# Patient Record
Sex: Male | Born: 1975 | Race: White | Hispanic: No | Smoking: Current every day smoker
Health system: Southern US, Community
[De-identification: ages and names within clinical notes are randomized; demographics above are authoritative.]

## PROBLEM LIST (undated history)

## (undated) DIAGNOSIS — F1120 Opioid dependence, uncomplicated: Secondary | ICD-10-CM

## (undated) DIAGNOSIS — J939 Pneumothorax, unspecified: Secondary | ICD-10-CM

## (undated) DIAGNOSIS — C328 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: Secondary | ICD-10-CM

## (undated) DIAGNOSIS — J9601 Acute respiratory failure with hypoxia: Secondary | ICD-10-CM

## (undated) DIAGNOSIS — I330 Acute and subacute infective endocarditis: Secondary | ICD-10-CM

## (undated) HISTORY — PX: OTHER SURGICAL HISTORY: SHX169

---

## 2002-11-03 ENCOUNTER — Emergency Department (HOSPITAL_COMMUNITY): Admission: EM | Admit: 2002-11-03 | Discharge: 2002-11-04 | Payer: Self-pay | Admitting: Emergency Medicine

## 2003-02-16 ENCOUNTER — Emergency Department (HOSPITAL_COMMUNITY): Admission: AD | Admit: 2003-02-16 | Discharge: 2003-02-16 | Payer: Self-pay | Admitting: Family Medicine

## 2003-06-06 ENCOUNTER — Emergency Department (HOSPITAL_COMMUNITY): Admission: EM | Admit: 2003-06-06 | Discharge: 2003-06-06 | Payer: Self-pay | Admitting: Emergency Medicine

## 2007-02-17 ENCOUNTER — Emergency Department (HOSPITAL_COMMUNITY): Admission: EM | Admit: 2007-02-17 | Discharge: 2007-02-17 | Payer: Self-pay | Admitting: Emergency Medicine

## 2007-11-22 ENCOUNTER — Emergency Department (HOSPITAL_BASED_OUTPATIENT_CLINIC_OR_DEPARTMENT_OTHER): Admission: EM | Admit: 2007-11-22 | Discharge: 2007-11-22 | Payer: Self-pay | Admitting: Emergency Medicine

## 2008-01-25 ENCOUNTER — Emergency Department (HOSPITAL_BASED_OUTPATIENT_CLINIC_OR_DEPARTMENT_OTHER): Admission: EM | Admit: 2008-01-25 | Discharge: 2008-01-25 | Payer: Self-pay | Admitting: Emergency Medicine

## 2009-02-13 ENCOUNTER — Emergency Department (HOSPITAL_BASED_OUTPATIENT_CLINIC_OR_DEPARTMENT_OTHER): Admission: EM | Admit: 2009-02-13 | Discharge: 2009-02-13 | Payer: Self-pay | Admitting: Emergency Medicine

## 2010-05-04 ENCOUNTER — Emergency Department (HOSPITAL_BASED_OUTPATIENT_CLINIC_OR_DEPARTMENT_OTHER)
Admission: EM | Admit: 2010-05-04 | Discharge: 2010-05-04 | Disposition: A | Discharge status: Home / Self Care | Payer: BLUE CROSS/BLUE SHIELD | Attending: Emergency Medicine | Admitting: Emergency Medicine

## 2010-05-04 ENCOUNTER — Emergency Department (INDEPENDENT_AMBULATORY_CARE_PROVIDER_SITE_OTHER): Payer: BLUE CROSS/BLUE SHIELD

## 2010-05-04 DIAGNOSIS — S022XXA Fracture of nasal bones, initial encounter for closed fracture: Secondary | ICD-10-CM | POA: Insufficient documentation

## 2010-05-04 DIAGNOSIS — R51 Headache: Secondary | ICD-10-CM

## 2010-05-04 DIAGNOSIS — F1010 Alcohol abuse, uncomplicated: Secondary | ICD-10-CM | POA: Insufficient documentation

## 2010-05-04 DIAGNOSIS — F0781 Postconcussional syndrome: Secondary | ICD-10-CM | POA: Insufficient documentation

## 2010-05-04 DIAGNOSIS — F1910 Other psychoactive substance abuse, uncomplicated: Secondary | ICD-10-CM | POA: Insufficient documentation

## 2010-05-04 DIAGNOSIS — X58XXXA Exposure to other specified factors, initial encounter: Secondary | ICD-10-CM

## 2010-05-04 DIAGNOSIS — S0990XA Unspecified injury of head, initial encounter: Secondary | ICD-10-CM | POA: Insufficient documentation

## 2010-05-04 DIAGNOSIS — W19XXXA Unspecified fall, initial encounter: Secondary | ICD-10-CM | POA: Insufficient documentation

## 2010-05-04 DIAGNOSIS — F17200 Nicotine dependence, unspecified, uncomplicated: Secondary | ICD-10-CM | POA: Insufficient documentation

## 2010-11-02 LAB — COMPREHENSIVE METABOLIC PANEL
ALT: 134 U/L — ABNORMAL HIGH (ref 0–53)
AST: 108 U/L — ABNORMAL HIGH (ref 0–37)
Albumin: 5.2 g/dL (ref 3.5–5.2)
Alkaline Phosphatase: 89 U/L (ref 39–117)
Chloride: 91 mEq/L — ABNORMAL LOW (ref 96–112)
Potassium: 3.6 mEq/L (ref 3.5–5.1)
Sodium: 136 mEq/L (ref 135–145)
Total Bilirubin: 1.1 mg/dL (ref 0.3–1.2)
Total Protein: 8.5 g/dL — ABNORMAL HIGH (ref 6.0–8.3)

## 2011-07-27 ENCOUNTER — Ambulatory Visit (INDEPENDENT_AMBULATORY_CARE_PROVIDER_SITE_OTHER): Payer: BC Managed Care – PPO | Admitting: Emergency Medicine

## 2011-07-27 ENCOUNTER — Ambulatory Visit: Payer: BC Managed Care – PPO

## 2011-07-27 VITALS — BP 136/101 | HR 91 | Temp 98.0°F | Resp 18 | Ht 69.5 in | Wt 130.0 lb

## 2011-07-27 DIAGNOSIS — Z5181 Encounter for therapeutic drug level monitoring: Secondary | ICD-10-CM

## 2011-07-27 DIAGNOSIS — R079 Chest pain, unspecified: Secondary | ICD-10-CM

## 2011-07-27 DIAGNOSIS — F411 Generalized anxiety disorder: Secondary | ICD-10-CM

## 2011-07-27 DIAGNOSIS — Z202 Contact with and (suspected) exposure to infections with a predominantly sexual mode of transmission: Secondary | ICD-10-CM

## 2011-07-27 DIAGNOSIS — R21 Rash and other nonspecific skin eruption: Secondary | ICD-10-CM

## 2011-07-27 DIAGNOSIS — Z79891 Long term (current) use of opiate analgesic: Secondary | ICD-10-CM

## 2011-07-27 DIAGNOSIS — Z2089 Contact with and (suspected) exposure to other communicable diseases: Secondary | ICD-10-CM

## 2011-07-27 DIAGNOSIS — F419 Anxiety disorder, unspecified: Secondary | ICD-10-CM

## 2011-07-27 LAB — POCT CBC
Granulocyte percent: 64.1 %G (ref 37–80)
MCH, POC: 32.6 pg — AB (ref 27–31.2)
MID (cbc): 0.8 (ref 0–0.9)
MPV: 9.3 fL (ref 0–99.8)
POC Granulocyte: 5.1 (ref 2–6.9)
POC MID %: 10.2 %M (ref 0–12)
Platelet Count, POC: 259 10*3/uL (ref 142–424)
RBC: 5.15 M/uL (ref 4.69–6.13)
WBC: 7.9 10*3/uL (ref 4.6–10.2)

## 2011-07-27 LAB — HEPATITIS C ANTIBODY: HCV Ab: NEGATIVE

## 2011-07-27 LAB — HIV ANTIBODY (ROUTINE TESTING W REFLEX): HIV: NONREACTIVE

## 2011-07-27 NOTE — Progress Notes (Signed)
  Subjective:    Patient ID: David [name], male    DOB: 1975-09-18, 36 y.o.   MRN: [id number]  HPI patient enters pain in the left side of his chest. He initially had some pain on the right side of his chest 6 weeks ago from an injury in then developed some pain on the left side. This became worse with a popping sensation and he is in today to have this checked. Patient also had STD exposure 6 weeks ago. His partner told him she had hepatitis C and HSV however he is not sure whether this is true. He has had no symptoms. 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 His neck supple. Chest is clear there is tenderness left lateral chest wall. The abdomen is soft liver and spleen are not enlarged there is no tenderness. Results for orders placed in visit on 07/27/11  POCT CBC      Component Value Range   WBC 7.9  4.6 - 10.2 K/uL   Lymph, poc 2.0  0.6 - 3.4   POC LYMPH PERCENT 25.7  10 - 50 %L   MID (cbc) 0.8  0 - 0.9   POC MID % 10.2  0 - 12 %M   POC Granulocyte 5.1  2 - 6.9   Granulocyte percent 64.1  37 - 80 %G   RBC 5.15  4.69 - 6.13 M/uL   Hemoglobin 16.8  14.1 - 18.1 g/dL   HCT, POC 40.9  81.1 - 53.7 %   MCV 100.9 (*) 80 - 97 fL   MCH, POC 32.6 (*) 27 - 31.2 pg   MCHC 32.3  31.8 - 35.4 g/dL   RDW, POC 91.4     Platelet Count, POC 259  142 - 424 K/uL   MPV 9.3  0 - 99.8 fL   [facility] reading (PRIMARY) by  Dr. Cleta Alberts  There is no evidence of a pneumothorax . There may be slight buckling of the left ninth rib       Assessment & Plan:  Patient to have a full STD screen. Also check films of the chest. No prescriptions were written today. He is to continued with [facility] regarding his anxiety disorder and Suboxone therapy.

## 2011-07-29 LAB — GC/CHLAMYDIA PROBE AMP, GENITAL: GC Probe Amp, Genital: NEGATIVE

## 2013-09-23 ENCOUNTER — Emergency Department (HOSPITAL_BASED_OUTPATIENT_CLINIC_OR_DEPARTMENT_OTHER): Payer: BC Managed Care – PPO

## 2013-09-23 ENCOUNTER — Emergency Department (HOSPITAL_BASED_OUTPATIENT_CLINIC_OR_DEPARTMENT_OTHER)
Admission: EM | Admit: 2013-09-23 | Discharge: 2013-09-23 | Disposition: A | Discharge status: Home / Self Care | Payer: BC Managed Care – PPO | Attending: Emergency Medicine | Admitting: Emergency Medicine

## 2013-09-23 ENCOUNTER — Encounter (HOSPITAL_BASED_OUTPATIENT_CLINIC_OR_DEPARTMENT_OTHER): Payer: Self-pay | Admitting: Emergency Medicine

## 2013-09-23 DIAGNOSIS — Z79899 Other long term (current) drug therapy: Secondary | ICD-10-CM | POA: Insufficient documentation

## 2013-09-23 DIAGNOSIS — R05 Cough: Secondary | ICD-10-CM | POA: Insufficient documentation

## 2013-09-23 DIAGNOSIS — M79609 Pain in unspecified limb: Secondary | ICD-10-CM | POA: Insufficient documentation

## 2013-09-23 DIAGNOSIS — R059 Cough, unspecified: Secondary | ICD-10-CM | POA: Insufficient documentation

## 2013-09-23 DIAGNOSIS — Z8709 Personal history of other diseases of the respiratory system: Secondary | ICD-10-CM | POA: Diagnosis not present

## 2013-09-23 DIAGNOSIS — I82811 Embolism and thrombosis of superficial veins of right lower extremities: Secondary | ICD-10-CM

## 2013-09-23 DIAGNOSIS — F17200 Nicotine dependence, unspecified, uncomplicated: Secondary | ICD-10-CM | POA: Diagnosis not present

## 2013-09-23 DIAGNOSIS — I82819 Embolism and thrombosis of superficial veins of unspecified lower extremities: Secondary | ICD-10-CM | POA: Diagnosis not present

## 2013-09-23 HISTORY — DX: Opioid dependence, uncomplicated: F11.20

## 2013-09-23 HISTORY — DX: Pneumothorax, unspecified: J93.9

## 2013-09-23 MED ORDER — BUPRENORPHINE HCL-NALOXONE HCL 4-1 MG SL FILM: 1.0000 | ORAL_FILM | Freq: Every day | SUBLINGUAL | Status: DC

## 2013-09-23 NOTE — ED Provider Notes (Signed)
CSN: [id number]     Arrival date & time 09/23/13  1359 History   First MD Initiated Contact with Patient 09/23/13 1451     Chief Complaint  Patient presents with  . Leg Pain     (Consider location/radiation/quality/duration/timing/severity/associated sxs/prior Treatment) Patient is a 38 y.o. male presenting with leg pain. The history is provided by the patient.  Leg Pain Location:  Leg Time since incident:  1 day Injury: no   Leg location:  R leg Pain details:    Quality:  Aching   Radiates to:  Does not radiate   Severity:  Mild   Onset quality:  Gradual   Duration:  1 day   Timing:  Constant   Progression:  Unchanged Chronicity:  New Dislocation: no   Foreign body present:  No foreign bodies Prior injury to area:  No Relieved by:  Nothing Worsened by:  Nothing tried Ineffective treatments:  None tried Associated symptoms: no fever and no neck pain     Past Medical History  Diagnosis Date  . Heroin addiction   . Pneumothorax     Left lung spontaneous pneumothorax at age 69 yr    Past Surgical History  Procedure Laterality Date  . Chest tubes Left    No family history on file. History  Substance Use Topics  . Smoking status: Current Every Day Smoker -- 3.00 packs/day    Types: Cigarettes  . Smokeless tobacco: Never Used  . Alcohol Use: 7.2 oz/week    12 Cans of beer per week     Comment: daily    Review of Systems  Constitutional: Negative for fever.  HENT: Negative for drooling and rhinorrhea.   Eyes: Negative for pain.  Respiratory: Positive for cough. Negative for shortness of breath.   Cardiovascular: Negative for chest pain and leg swelling.  Gastrointestinal: Negative for nausea, vomiting, abdominal pain and diarrhea.  Genitourinary: Negative for dysuria and hematuria.  Musculoskeletal: Negative for gait problem and neck pain.       Leg pain  Skin: Negative for color change.  Neurological: Negative for numbness and headaches.  Hematological:  Negative for adenopathy.  Psychiatric/Behavioral: Negative for behavioral problems.  All other systems reviewed and are negative.     Allergies  Review of patient's allergies indicates no known allergies.  Home Medications   Prior to Admission medications   Medication Sig Start Date End Date Taking? Authorizing Provider  buprenorphine-naloxone (SUBOXONE) 8-2 MG SUBL Place under the tongue daily.    Historical Provider, MD   BP 119/89  Pulse 104  Temp(Src) 98.1 F (36.7 C) (Oral)  Resp 22  SpO2 100% Physical Exam  Nursing note and vitals reviewed. Constitutional: He is oriented to person, place, and time. He appears well-developed and well-nourished.  HENT:  Head: Normocephalic and atraumatic.  Right Ear: External ear normal.  Left Ear: External ear normal.  Nose: Nose normal.  Mouth/Throat: Oropharynx is clear and moist. No oropharyngeal exudate.  Eyes: Conjunctivae and EOM are normal. Pupils are equal, round, and reactive to light.  Neck: Normal range of motion. Neck supple.  Cardiovascular: Normal rate, regular rhythm, normal heart sounds and intact distal pulses.  Exam reveals no gallop and no friction rub.   No murmur heard. Pulmonary/Chest: Effort normal and breath sounds normal. No respiratory distress. He has no wheezes.  Abdominal: Soft. Bowel sounds are normal. He exhibits no distension. There is no tenderness. There is no rebound and no guarding.  Musculoskeletal: Normal range of motion. He  exhibits tenderness. He exhibits no edema.  2+ distal pulses and lower extremities.  Small area of erythema and mild tenderness to palpation in the medial popliteal fossa and distal medial thigh of the right lower extremity.  Neurological: He is alert and oriented to person, place, and time.  Skin: Skin is warm and dry.  Psychiatric: He has a normal mood and affect. His behavior is normal.    ED Course  Procedures (including critical care time) Labs Review Labs Reviewed -  No data to display  Imaging Review Dg Chest 2 View  09/23/2013   CLINICAL DATA:  Cough and chest congestion for 3 weeks; history of left-sided pneumothorax  EXAM: CHEST  2 VIEW  COMPARISON:  Rib series of July 27, 2011  FINDINGS: The lungs are well-expanded and clear. The heart and pulmonary vascularity are normal. There is no pleural effusion. There is no pneumothorax or pneumomediastinum. The bony thorax is unremarkable.  IMPRESSION: There is no acute cardiopulmonary abnormality.   Electronically Signed   By: David  Martinique   On: 09/23/2013 15:49   US Venous Img Lower Unilateral Right  09/23/2013   CLINICAL DATA:  Redness. Intravenous drug abuse. Deep venous thrombosis. Cellulitis. Redness in the medial knee at heroin injection site.  EXAM: RIGHT LOWER EXTREMITY VENOUS DOPPLER ULTRASOUND  TECHNIQUE: Gray-scale sonography with graded compression, as well as color Doppler and duplex ultrasound were performed to evaluate the lower extremity deep venous systems from the level of the common femoral vein and including the common femoral, femoral, profunda femoral, popliteal and calf veins including the posterior tibial, peroneal and gastrocnemius veins when visible. The superficial great saphenous vein was also interrogated. Spectral Doppler was utilized to evaluate flow at rest and with distal augmentation maneuvers in the common femoral, femoral and popliteal veins.  COMPARISON:  None.  FINDINGS: Common Femoral Vein: No evidence of thrombus. Normal compressibility, respiratory phasicity and response to augmentation.  Saphenofemoral Junction: No evidence of thrombus. Normal compressibility and flow on color Doppler imaging.  Profunda Femoral Vein: No evidence of thrombus. Normal compressibility and flow on color Doppler imaging.  Femoral Vein: No evidence of thrombus. Normal compressibility, respiratory phasicity and response to augmentation.  Popliteal Vein: No evidence of thrombus. Normal compressibility,  respiratory phasicity and response to augmentation.  Calf Veins: No evidence of thrombus. Normal compressibility and flow on color Doppler imaging.  Superficial Great Saphenous Vein: Proximal saphenous vein is patent. Occlusive thrombus is present in the greater saphenous vein in the midthigh and at the level of the knee. This is noncompressible. Below-the-knee, the greater saphenous vein is patent.  Venous Reflux:  None.  Other Findings:  None.  IMPRESSION: 1. No deep venous thrombosis. 2. Occlusive thrombus in the mid to distal greater saphenous vein of the thigh. This extends from mid thigh to the knee.   Electronically Signed   By: Dereck Ligas M.D.   On: 09/23/2013 17:49     EKG Interpretation None      MDM   Final diagnoses:  Thrombosis of right saphenous vein    3:21 PM 38 y.o. male w hx of drug abuse, previously on suboxone and methadone who states he has been injecting suboxone (off the street) into his legs for about 2 mos now. He pw mild tenderness to palpation and redness of the medial popliteal fossa and medial thigh on the right lower extremity. He denies any chest pain or shortness of breath. He has had a chronic cough. Will get chest x-ray  d/t cough and ultrasound to rule out DVT.  6:36 PM: Found to have superficial thrombosis, likely related to suboxone injections. Will rec warm compress/nsaids. Will rec he refrain from injecting the suboxone. Will provide Rx for small amount of suboxone until he can f/u w/ his pcp later this month. His father is here and plans to disperse the suboxone films to him to prevent him from injecting them.  I have discussed the diagnosis/risks/treatment options with the patient and believe the pt to be eligible for discharge home to follow-up with his pcp. We also discussed returning to the ED immediately if new or worsening sx occur. We discussed the sx which are most concerning (e.g., sob, cp, inc redness, swelling, pain) that necessitate immediate  return. Medications administered to the patient during their visit and any new prescriptions provided to the patient are listed below.  Medications given during this visit Medications - No data to display  New Prescriptions   BUPRENORPHINE HCL-NALOXONE HCL (SUBOXONE) 4-1 MG FILM    Place 1 Film under the tongue daily.     Pamella Pert, MD 09/23/13 [phone]

## 2013-09-23 NOTE — ED Notes (Signed)
States he was taken off Suboxon and started injecting drugs in his legs. Track marks noted. Here with pain and a knot in his right lower leg.

## 2014-08-28 ENCOUNTER — Emergency Department (HOSPITAL_COMMUNITY)
Admission: EM | Admit: 2014-08-28 | Discharge: 2014-08-28 | Disposition: A | Discharge status: Home / Self Care | Payer: BLUE CROSS/BLUE SHIELD | Attending: Emergency Medicine | Admitting: Emergency Medicine

## 2014-08-28 ENCOUNTER — Emergency Department (HOSPITAL_COMMUNITY): Payer: BLUE CROSS/BLUE SHIELD

## 2014-08-28 ENCOUNTER — Encounter (HOSPITAL_COMMUNITY): Payer: Self-pay | Admitting: Emergency Medicine

## 2014-08-28 DIAGNOSIS — Y999 Unspecified external cause status: Secondary | ICD-10-CM | POA: Diagnosis not present

## 2014-08-28 DIAGNOSIS — Y929 Unspecified place or not applicable: Secondary | ICD-10-CM | POA: Diagnosis not present

## 2014-08-28 DIAGNOSIS — Z720 Tobacco use: Secondary | ICD-10-CM | POA: Diagnosis not present

## 2014-08-28 DIAGNOSIS — S01419A Laceration without foreign body of unspecified cheek and temporomandibular area, initial encounter: Secondary | ICD-10-CM | POA: Diagnosis not present

## 2014-08-28 DIAGNOSIS — Y939 Activity, unspecified: Secondary | ICD-10-CM | POA: Diagnosis not present

## 2014-08-28 DIAGNOSIS — S0990XA Unspecified injury of head, initial encounter: Secondary | ICD-10-CM | POA: Diagnosis not present

## 2014-08-28 DIAGNOSIS — S0181XA Laceration without foreign body of other part of head, initial encounter: Secondary | ICD-10-CM | POA: Diagnosis not present

## 2014-08-28 DIAGNOSIS — S0993XA Unspecified injury of face, initial encounter: Secondary | ICD-10-CM | POA: Diagnosis present

## 2014-08-28 DIAGNOSIS — S0083XA Contusion of other part of head, initial encounter: Secondary | ICD-10-CM

## 2014-08-28 DIAGNOSIS — Z79899 Other long term (current) drug therapy: Secondary | ICD-10-CM | POA: Insufficient documentation

## 2014-08-28 MED ORDER — LIDOCAINE-EPINEPHRINE 2 %-1:100000 IJ SOLN
20.0000 mL | Freq: Once | INTRAMUSCULAR | Status: DC
  Filled 2014-08-28: qty 1

## 2014-08-28 MED ORDER — TETANUS-DIPHTH-ACELL PERTUSSIS 5-2.5-18.5 LF-MCG/0.5 IM SUSP
0.5000 mL | Freq: Once | INTRAMUSCULAR | Status: AC
  Administered 2014-08-28: 0.5 mL via INTRAMUSCULAR
  Filled 2014-08-28: qty 0.5
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
## 2014-08-28 NOTE — Discharge Instructions (Signed)
Keep the lacerations clean. Bacitracin to the facial laceration twice a day. Ibuprofen or Tylenol for pain. Ice pack to the face. Follow-up in 5 days for suture removal.  Facial Laceration  A facial laceration is a cut on the face. These injuries can be painful and cause bleeding. Lacerations usually heal quickly, but they need special care to reduce scarring. DIAGNOSIS  Your health care provider will take a medical history, ask for details about how the injury occurred, and examine the wound to determine how deep the cut is. TREATMENT  Some facial lacerations may not require closure. Others may not be able to be closed because of an increased risk of infection. The risk of infection and the chance for successful closure will depend on various factors, including the amount of time since the injury occurred. The wound may be cleaned to help prevent infection. If closure is appropriate, pain medicines may be given if needed. Your health care provider will use stitches (sutures), wound glue (adhesive), or skin adhesive strips to repair the laceration. These tools bring the skin edges together to allow for faster healing and a better cosmetic outcome. If needed, you may also be given a tetanus shot. HOME CARE INSTRUCTIONS  Only take over-the-counter or prescription medicines as directed by your health care provider.  Follow your health care provider's instructions for wound care. These instructions will vary depending on the technique used for closing the wound. For Sutures:  Keep the wound clean and dry.   If you were given a bandage (dressing), you should change it at least once a day. Also change the dressing if it becomes wet or dirty, or as directed by your health care provider.   Wash the wound with soap and water 2 times a day. Rinse the wound off with water to remove all soap. Pat the wound dry with a clean towel.   After cleaning, apply a thin layer of the antibiotic ointment  recommended by your health care provider. This will help prevent infection and keep the dressing from sticking.   You may shower as usual after the first 24 hours. Do not soak the wound in water until the sutures are removed.   Get your sutures removed as directed by your health care provider. With facial lacerations, sutures should usually be taken out after 4-5 days to avoid stitch marks.   Wait a few days after your sutures are removed before applying any makeup. For Skin Adhesive Strips:  Keep the wound clean and dry.   Do not get the skin adhesive strips wet. You may bathe carefully, using caution to keep the wound dry.   If the wound gets wet, pat it dry with a clean towel.   Skin adhesive strips will fall off on their own. You may trim the strips as the wound heals. Do not remove skin adhesive strips that are still stuck to the wound. They will fall off in time.  For Wound Adhesive:  You may briefly wet your wound in the shower or bath. Do not soak or scrub the wound. Do not swim. Avoid periods of heavy sweating until the skin adhesive has fallen off on its own. After showering or bathing, gently pat the wound dry with a clean towel.   Do not apply liquid medicine, cream medicine, ointment medicine, or makeup to your wound while the skin adhesive is in place. This may loosen the film before your wound is healed.   If a dressing is placed over  the wound, be careful not to apply tape directly over the skin adhesive. This may cause the adhesive to be pulled off before the wound is healed.   Avoid prolonged exposure to sunlight or tanning lamps while the skin adhesive is in place.  The skin adhesive will usually remain in place for 5-10 days, then naturally fall off the skin. Do not pick at the adhesive film.  After Healing: Once the wound has healed, cover the wound with sunscreen during the day for 1 full year. This can help minimize scarring. Exposure to ultraviolet light  in the first year will darken the scar. It can take 1-2 years for the scar to lose its redness and to heal completely.  SEEK IMMEDIATE MEDICAL CARE IF:  You have redness, pain, or swelling around the wound.   You see ayellowish-white fluid (pus) coming from the wound.   You have chills or a fever.  MAKE SURE YOU:  Understand these instructions.  Will watch your condition.  Will get help right away if you are not doing well or get worse. Document Released: 02/22/2004 Document Revised: 11/04/2012 Document Reviewed: 08/27/2012 [facility] Patient Information 2015 [place], [place]. This information is not intended to replace advice given to you by your health care provider. Make sure you discuss any questions you have with your health care provider.   Head Injury You have received a head injury. It does not appear serious at this time. Headaches and vomiting are common following head injury. It should be easy to awaken from sleeping. Sometimes it is necessary for you to stay in the emergency department for a while for observation. Sometimes admission to the hospital may be needed. After injuries such as yours, most problems occur within the first 24 hours, but side effects may occur up to 7-10 days after the injury. It is important for you to carefully monitor your condition and contact your health care provider or seek immediate medical care if there is a change in your condition. WHAT ARE THE TYPES OF HEAD INJURIES? Head injuries can be as minor as a bump. Some head injuries can be more severe. More severe head injuries include:  A jarring injury to the brain (concussion).  A bruise of the brain (contusion). This mean there is bleeding in the brain that can cause swelling.  A cracked skull (skull fracture).  Bleeding in the brain that collects, clots, and forms a bump (hematoma). WHAT CAUSES A HEAD INJURY? A serious head injury is most likely to happen to someone who is in a car wreck  and is not wearing a seat belt. Other causes of major head injuries include bicycle or motorcycle accidents, sports injuries, and falls. HOW ARE HEAD INJURIES DIAGNOSED? A complete history of the event leading to the injury and your current symptoms will be helpful in diagnosing head injuries. Many times, pictures of the brain, such as CT or MRI are needed to see the extent of the injury. Often, an overnight hospital stay is necessary for observation.  WHEN SHOULD I SEEK IMMEDIATE MEDICAL CARE?  You should get help right away if:  You have confusion or drowsiness.  You feel sick to your stomach (nauseous) or have continued, forceful vomiting.  You have dizziness or unsteadiness that is getting worse.  You have severe, continued headaches not relieved by medicine. Only take over-the-counter or prescription medicines for pain, fever, or discomfort as directed by your health care provider.  You do not have normal function of the arms  or legs or are unable to walk.  You notice changes in the black spots in the center of the colored part of your eye (pupil).  You have a clear or bloody fluid coming from your nose or ears.  You have a loss of vision. During the next 24 hours after the injury, you must stay with someone who can watch you for the warning signs. This person should contact local emergency services (911 in the [place] if you have seizures, you become unconscious, or you are unable to wake up. HOW CAN I PREVENT A HEAD INJURY IN THE FUTURE? The most important factor for preventing major head injuries is avoiding motor vehicle accidents. To minimize the potential for damage to your head, it is crucial to wear seat belts while riding in motor vehicles. Wearing helmets while bike riding and playing collision sports (like football) is also helpful. Also, avoiding dangerous activities around the house will further help reduce your risk of head injury.  WHEN CAN I RETURN TO NORMAL ACTIVITIES AND  ATHLETICS? You should be reevaluated by your health care provider before returning to these activities. If you have any of the following symptoms, you should not return to activities or contact sports until 1 week after the symptoms have stopped:  Persistent headache.  Dizziness or vertigo.  Poor attention and concentration.  Confusion.  Memory problems.  Nausea or vomiting.  Fatigue or tire easily.  Irritability.  Intolerant of bright lights or loud noises.  Anxiety or depression.  Disturbed sleep. MAKE SURE YOU:   Understand these instructions.  Will watch your condition.  Will get help right away if you are not doing well or get worse. Document Released: 01/14/2005 Document Revised: 01/19/2013 Document Reviewed: 09/21/2012 [facility] Patient Information 2015 [place], [place]. This information is not intended to replace advice given to you by your health care provider. Make sure you discuss any questions you have with your health care provider.

## 2014-08-28 NOTE — ED Notes (Signed)
Bed: [facility]3 Expected date:  Expected time:  Means of arrival:  Comments: Pt
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
## 2014-09-17 ENCOUNTER — Telehealth (HOSPITAL_BASED_OUTPATIENT_CLINIC_OR_DEPARTMENT_OTHER): Payer: Self-pay | Admitting: Emergency Medicine

## 2019-02-18 ENCOUNTER — Other Ambulatory Visit: Payer: Self-pay

## 2019-02-18 ENCOUNTER — Emergency Department (HOSPITAL_BASED_OUTPATIENT_CLINIC_OR_DEPARTMENT_OTHER): Payer: Self-pay

## 2019-02-18 ENCOUNTER — Encounter (HOSPITAL_BASED_OUTPATIENT_CLINIC_OR_DEPARTMENT_OTHER): Payer: Self-pay

## 2019-02-18 ENCOUNTER — Emergency Department (HOSPITAL_BASED_OUTPATIENT_CLINIC_OR_DEPARTMENT_OTHER)
Admission: EM | Admit: 2019-02-18 | Discharge: 2019-02-18 | Disposition: A | Discharge status: Home / Self Care | Payer: Self-pay | Attending: Emergency Medicine | Admitting: Emergency Medicine

## 2019-02-18 DIAGNOSIS — S2242XA Multiple fractures of ribs, left side, initial encounter for closed fracture: Secondary | ICD-10-CM

## 2019-02-18 DIAGNOSIS — Y929 Unspecified place or not applicable: Secondary | ICD-10-CM | POA: Insufficient documentation

## 2019-02-18 DIAGNOSIS — Y939 Activity, unspecified: Secondary | ICD-10-CM | POA: Insufficient documentation

## 2019-02-18 DIAGNOSIS — Y999 Unspecified external cause status: Secondary | ICD-10-CM | POA: Insufficient documentation

## 2019-02-18 DIAGNOSIS — J90 Pleural effusion, not elsewhere classified: Secondary | ICD-10-CM

## 2019-02-18 DIAGNOSIS — F17210 Nicotine dependence, cigarettes, uncomplicated: Secondary | ICD-10-CM | POA: Insufficient documentation

## 2019-02-18 DIAGNOSIS — S270XXA Traumatic pneumothorax, initial encounter: Secondary | ICD-10-CM

## 2019-02-18 DIAGNOSIS — Z79899 Other long term (current) drug therapy: Secondary | ICD-10-CM | POA: Insufficient documentation

## 2019-02-18 MED ORDER — CELECOXIB 200 MG PO CAPS: 200.0000 mg | ORAL_CAPSULE | Freq: Two times a day (BID) | ORAL | 0 refills | Status: DC

## 2019-02-18 MED ORDER — OXYCODONE HCL 5 MG PO TABS: 2.5000 mg | ORAL_TABLET | Freq: Four times a day (QID) | ORAL | 0 refills | Status: DC | PRN
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
## 2019-02-18 NOTE — Discharge Instructions (Signed)
Contact a health care provider if: You have a fever. Get help right away if: You have difficulty breathing or you are short of breath. You develop a cough that does not stop, or you cough up thick or bloody sputum. You have nausea, vomiting, or pain in your abdomen. Your pain gets worse and medicine does not help. 
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
## 2019-08-22 ENCOUNTER — Other Ambulatory Visit: Payer: Self-pay

## 2019-08-22 ENCOUNTER — Emergency Department (HOSPITAL_BASED_OUTPATIENT_CLINIC_OR_DEPARTMENT_OTHER)
Admission: EM | Admit: 2019-08-22 | Discharge: 2019-08-22 | Disposition: A | Discharge status: Home / Self Care | Payer: BLUE CROSS/BLUE SHIELD | Attending: Emergency Medicine | Admitting: Emergency Medicine

## 2019-08-22 ENCOUNTER — Encounter (HOSPITAL_BASED_OUTPATIENT_CLINIC_OR_DEPARTMENT_OTHER): Payer: Self-pay | Admitting: Emergency Medicine

## 2019-08-22 DIAGNOSIS — R309 Painful micturition, unspecified: Secondary | ICD-10-CM | POA: Insufficient documentation

## 2019-08-22 DIAGNOSIS — Z5321 Procedure and treatment not carried out due to patient leaving prior to being seen by health care provider: Secondary | ICD-10-CM | POA: Insufficient documentation

## 2019-08-22 NOTE — ED Triage Notes (Signed)
Pt here with painful urination x 1 week.

## 2020-10-20 ENCOUNTER — Ambulatory Visit
Admission: EM | Admit: 2020-10-20 | Discharge: 2020-10-20 | Disposition: A | Discharge status: Home / Self Care | Payer: Medicaid Other | Attending: Emergency Medicine | Admitting: Emergency Medicine

## 2020-10-20 ENCOUNTER — Other Ambulatory Visit: Payer: Self-pay

## 2020-10-20 DIAGNOSIS — J302 Other seasonal allergic rhinitis: Secondary | ICD-10-CM

## 2020-10-20 MED ORDER — FLUTICASONE PROPIONATE 50 MCG/ACT NA SUSP: 2.0000 | Freq: Every day | NASAL | 2 refills | Status: DC

## 2020-10-20 NOTE — ED Triage Notes (Signed)
Pt reports sore throat for a few weeks, patient denies other symptoms.
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
## 2020-10-20 NOTE — Discharge Instructions (Addendum)
The history that you provided for me today along with my physical exam findings is consistent with seasonal allergies.  I sent a prescription for Flonase to your pharmacy, please administer 2 sprays in each nare daily for the first several days, then decrease to once daily once your symptoms improve.

## 2020-10-23 ENCOUNTER — Other Ambulatory Visit: Payer: Self-pay

## 2020-10-23 ENCOUNTER — Ambulatory Visit
Admission: EM | Admit: 2020-10-23 | Discharge: 2020-10-23 | Disposition: A | Discharge status: Other Acute Inpatient Hospital | Payer: Medicaid Other

## 2020-10-28 ENCOUNTER — Other Ambulatory Visit: Payer: Self-pay

## 2020-10-28 ENCOUNTER — Encounter (HOSPITAL_BASED_OUTPATIENT_CLINIC_OR_DEPARTMENT_OTHER): Payer: Self-pay

## 2020-10-28 ENCOUNTER — Emergency Department (HOSPITAL_BASED_OUTPATIENT_CLINIC_OR_DEPARTMENT_OTHER)
Admission: EM | Admit: 2020-10-28 | Discharge: 2020-10-28 | Disposition: A | Discharge status: Home / Self Care | Payer: Medicaid Other | Attending: Emergency Medicine | Admitting: Emergency Medicine

## 2020-10-28 DIAGNOSIS — R220 Localized swelling, mass and lump, head: Secondary | ICD-10-CM | POA: Diagnosis present

## 2020-10-28 DIAGNOSIS — J029 Acute pharyngitis, unspecified: Secondary | ICD-10-CM | POA: Insufficient documentation

## 2020-10-28 DIAGNOSIS — Z5321 Procedure and treatment not carried out due to patient leaving prior to being seen by health care provider: Secondary | ICD-10-CM | POA: Diagnosis not present

## 2020-10-28 NOTE — ED Triage Notes (Signed)
Pt states he was seen at [facility] recently for swollen R lymph nodes in his neck and prescribed "a nasal spray". Pt reports concern for infection and would like a prescription for antibiotics. Pt states he has had a sore throat for approximately two weeks. Pt in NAD during triage. During triage pt reports homelessness and addiction to IV drugs.
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
## 2020-11-08 ENCOUNTER — Emergency Department (HOSPITAL_BASED_OUTPATIENT_CLINIC_OR_DEPARTMENT_OTHER)
Admission: EM | Admit: 2020-11-08 | Discharge: 2020-11-08 | Disposition: A | Discharge status: Home / Self Care | Payer: Medicaid Other | Attending: Emergency Medicine | Admitting: Emergency Medicine

## 2020-11-08 ENCOUNTER — Other Ambulatory Visit: Payer: Self-pay

## 2020-11-08 ENCOUNTER — Encounter (HOSPITAL_BASED_OUTPATIENT_CLINIC_OR_DEPARTMENT_OTHER): Payer: Self-pay

## 2020-11-08 DIAGNOSIS — L049 Acute lymphadenitis, unspecified: Secondary | ICD-10-CM | POA: Insufficient documentation

## 2020-11-08 DIAGNOSIS — I889 Nonspecific lymphadenitis, unspecified: Secondary | ICD-10-CM

## 2020-11-08 DIAGNOSIS — J029 Acute pharyngitis, unspecified: Secondary | ICD-10-CM | POA: Diagnosis not present

## 2020-11-08 DIAGNOSIS — R221 Localized swelling, mass and lump, neck: Secondary | ICD-10-CM | POA: Diagnosis present

## 2020-11-08 DIAGNOSIS — F17210 Nicotine dependence, cigarettes, uncomplicated: Secondary | ICD-10-CM | POA: Diagnosis not present

## 2020-11-08 MED ORDER — AMOXICILLIN-POT CLAVULANATE 875-125 MG PO TABS
1.0000 | ORAL_TABLET | Freq: Two times a day (BID) | ORAL | 0 refills | Status: DC
Start: 2020-11-08 — End: 2021-01-08

## 2020-11-08 MED ORDER — AMOXICILLIN-POT CLAVULANATE 875-125 MG PO TABS
1.0000 | ORAL_TABLET | Freq: Once | ORAL | Status: AC
  Administered 2020-11-08: 1 via ORAL
  Filled 2020-11-08: qty 1

## 2020-11-08 MED ORDER — AMOXICILLIN-POT CLAVULANATE 875-125 MG PO TABS
1.0000 | ORAL_TABLET | Freq: Two times a day (BID) | ORAL | 0 refills | Status: DC
Start: 2020-11-08 — End: 2020-11-08
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
## 2020-11-08 NOTE — ED Provider Notes (Signed)
[name] EMERGENCY DEPARTMENT Provider Note   CSN: [id number] Arrival date & time: 11/08/20  1136     History Chief Complaint  Patient presents with   Lymphadenopathy    David [name] is a 45 y.o. male.  45 yo M with a chief complaints of right-sided neck swelling.  Going on for a few weeks now.  Having a sore throat in addition of this.  He has gone a couple times to urgent care as well as to the emergency department.  Was seen at urgent care and thought to be allergy related and was started on nasal sprays.  Had worsening symptoms and worsening swelling.  Denies any fever denies night sweats denies unintentional weight loss out of the ordinary.  He is homeless, and denies any drug abuser.  He denies any cough congestion denies any abdominal pain denies swollen lymph nodes in any other area.  The history is provided by the patient.  Illness Severity:  Moderate Onset quality:  Gradual Duration:  1 month Timing:  Constant Progression:  Worsening Chronicity:  New Associated symptoms: sore throat   Associated symptoms: no abdominal pain, no chest pain, no congestion, no diarrhea, no fever, no headaches, no myalgias, no rash, no shortness of breath and no vomiting       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[place])    Pneumothorax    Left lung spontaneous pneumothorax at age 49 yr     There are no problems to display for this patient.   Past Surgical History:  Procedure Laterality Date   chest tubes Left        No family history on file.  Social History   Tobacco Use   Smoking status: Every Day    Packs/day: 3.00    Types: Cigarettes   Smokeless tobacco: Never  Vaping Use   Vaping Use: Never used  Substance Use Topics   Alcohol use: Not Currently    Alcohol/week: 12.0 standard drinks    Types: 12 Cans of beer per week   Drug use: Yes    Types: IV    Comment: "opiods"    Home Medications Prior to Admission  medications   Medication Sig Start Date End Date Taking? Authorizing Provider  amoxicillin-clavulanate (AUGMENTIN) 875-125 MG tablet Take 1 tablet by mouth 2 (two) times daily. One po bid x 7 days 11/08/20  Yes Deno Etienne, DO  fluticasone [facility]) 50 MCG/ACT nasal spray Place 2 sprays into both nostrils daily. 10/20/20 11/19/20  Lynden Oxford Scales, PA-C  ibuprofen (ADVIL,MOTRIN) 200 MG tablet Take 600 mg by mouth every 6 (six) hours as needed.    [provider]    Allergies    Patient has no known allergies.  Review of Systems   Review of Systems  Constitutional:  Negative for chills and fever.  HENT:  Positive for sore throat. Negative for congestion and facial swelling.   Eyes:  Negative for discharge and visual disturbance.  Respiratory:  Negative for shortness of breath.   Cardiovascular:  Negative for chest pain and palpitations.  Gastrointestinal:  Negative for abdominal pain, diarrhea and vomiting.  Musculoskeletal:  Negative for arthralgias and myalgias.  Skin:  Negative for color change and rash.  Neurological:  Negative for tremors, syncope and headaches.  Psychiatric/Behavioral:  Negative for confusion and dysphoric mood.    Physical Exam Updated Vital Signs BP 98/70 (BP Location: Right Arm)   Pulse 73   Temp 98 F (36.7 C) (  Oral)   Resp 18   Ht 5\' 10"  (1.778 m)   Wt 57.2 kg   SpO2 100%   BMI 18.08 kg/m   Physical Exam Vitals and nursing note reviewed.  Constitutional:      Appearance: He is well-developed.  HENT:     Head: Normocephalic and atraumatic.     Comments: Right-sided lymphadenopathy, 1 lymph node in particular that is swollen and tender to the touch.  Mobile.  Posterior oropharynx without significant erythema no tonsillar swelling no exudates tolerating secretions without difficulty.  Able to range his head 45 degrees in either direction without any discomfort. Eyes:     Pupils: Pupils are equal, round, and reactive to light.  Neck:      Vascular: No JVD.  Cardiovascular:     Rate and Rhythm: Normal rate and regular rhythm.     Heart sounds: No murmur heard.   No friction rub. No gallop.     Comments: No appreciable murmur Pulmonary:     Effort: No respiratory distress.     Breath sounds: No wheezing.  Abdominal:     General: There is no distension.     Tenderness: There is no abdominal tenderness. There is no guarding or rebound.  Musculoskeletal:        General: Normal range of motion.     Cervical back: Normal range of motion and neck supple.  Skin:    Coloration: Skin is not pale.     Findings: No rash.  Neurological:     Mental Status: He is alert and oriented to person, place, and time.  Psychiatric:        Behavior: Behavior normal.    ED Results / Procedures / Treatments   Labs (all labs ordered are listed, but only abnormal results are displayed) Labs Reviewed - No data to display  EKG None  Radiology No results found.  Procedures Procedures   Medications Ordered in ED Medications  amoxicillin-clavulanate (AUGMENTIN) 875-125 MG per tablet 1 tablet (has no administration in time range)    ED Course  I have reviewed the triage vital signs and the nursing notes.  Pertinent labs & imaging results that were available during my care of the patient were reviewed by me and considered in my medical decision making (see chart for details).    MDM Rules/Calculators/A&P                           45 yo M with a chief complaints of right-sided lymphadenopathy.  Patient likely has lymphadenitis based on exam.  I discussed other possible pathologies with him and he is agreeable to a trial of antibiotics to attempt resolution.  I encouraged him that if it does not resolve he likely needs blood work and possibly imaging.  1:37 PM:  I have discussed the diagnosis/risks/treatment options with the patient and believe the pt to be eligible for discharge home to follow-up with PCP. We also discussed  returning to the ED immediately if new or worsening sx occur. We discussed the sx which are most concerning (e.g., sudden worsening pain, fever, inability to tolerate by mouth) that necessitate immediate return. Medications administered to the patient during their visit and any new prescriptions provided to the patient are listed below.  Medications given during this visit Medications  amoxicillin-clavulanate (AUGMENTIN) 875-125 MG per tablet 1 tablet (has no administration in time range)     The patient appears reasonably screen and/or stabilized  for discharge and I doubt any other medical condition or other [facility] requiring further screening, evaluation, or treatment in the ED at this time prior to discharge.   Final Clinical Impression(s) / ED Diagnoses Final diagnoses:  Lymphadenitis    Rx / DC Orders ED Discharge Orders          Ordered    amoxicillin-clavulanate (AUGMENTIN) 875-125 MG tablet  2 times daily        11/08/20 [place], [place], DO 11/08/20 1338

## 2020-11-08 NOTE — ED Notes (Signed)
Pt escorted to discharge window. Verbalized understanding discharge instructions. In no acute distress.   

## 2020-11-08 NOTE — Discharge Instructions (Signed)
This should hopefully go away with the antibiotic use.  If it does not you need to follow-up with the family doctor in the office and have them reevaluate you.  Likely then you will need blood work and possibly imaging of this area.  If it persists they may need to consider sending somewhere to have a biopsy performed.  I have given you information for provider that takes care of of people that do not have a place to live.  He can try and call her and see if she is taking new patients.

## 2020-11-20 ENCOUNTER — Emergency Department (HOSPITAL_BASED_OUTPATIENT_CLINIC_OR_DEPARTMENT_OTHER)
Admission: EM | Admit: 2020-11-20 | Discharge: 2020-11-20 | Disposition: A | Discharge status: Home / Self Care | Payer: Medicaid Other | Attending: Emergency Medicine | Admitting: Emergency Medicine

## 2020-11-20 ENCOUNTER — Other Ambulatory Visit: Payer: Self-pay

## 2020-11-20 ENCOUNTER — Encounter (HOSPITAL_BASED_OUTPATIENT_CLINIC_OR_DEPARTMENT_OTHER): Payer: Self-pay

## 2020-11-20 ENCOUNTER — Emergency Department (HOSPITAL_BASED_OUTPATIENT_CLINIC_OR_DEPARTMENT_OTHER): Payer: Medicaid Other

## 2020-11-20 DIAGNOSIS — R221 Localized swelling, mass and lump, neck: Secondary | ICD-10-CM | POA: Insufficient documentation

## 2020-11-20 DIAGNOSIS — F17210 Nicotine dependence, cigarettes, uncomplicated: Secondary | ICD-10-CM | POA: Insufficient documentation

## 2020-11-20 DIAGNOSIS — E871 Hypo-osmolality and hyponatremia: Secondary | ICD-10-CM | POA: Insufficient documentation

## 2020-11-20 DIAGNOSIS — C329 Malignant neoplasm of larynx, unspecified: Secondary | ICD-10-CM

## 2020-11-20 LAB — CBC WITH DIFFERENTIAL/PLATELET
Abs Immature Granulocytes: 0.02 10*3/uL (ref 0.00–0.07)
Basophils Absolute: 0 10*3/uL (ref 0.0–0.1)
Basophils Relative: 1 %
Eosinophils Absolute: 0.2 10*3/uL (ref 0.0–0.5)
Eosinophils Relative: 2 %
HCT: 38.7 % — ABNORMAL LOW (ref 39.0–52.0)
Hemoglobin: 13 g/dL (ref 13.0–17.0)
Immature Granulocytes: 0 %
Lymphocytes Relative: 20 %
Lymphs Abs: 1.7 10*3/uL (ref 0.7–4.0)
MCH: 29.7 pg (ref 26.0–34.0)
MCHC: 33.6 g/dL (ref 30.0–36.0)
MCV: 88.6 fL (ref 80.0–100.0)
Monocytes Absolute: 0.8 10*3/uL (ref 0.1–1.0)
Monocytes Relative: 10 %
Neutro Abs: 5.8 10*3/uL (ref 1.7–7.7)
Neutrophils Relative %: 67 %
Platelets: 268 10*3/uL (ref 150–400)
RBC: 4.37 MIL/uL (ref 4.22–5.81)
RDW: 14.6 % (ref 11.5–15.5)
WBC: 8.4 10*3/uL (ref 4.0–10.5)
nRBC: 0 % (ref 0.0–0.2)

## 2020-11-20 LAB — BASIC METABOLIC PANEL
Anion gap: 8 (ref 5–15)
BUN: 12 mg/dL (ref 6–20)
CO2: 27 mmol/L (ref 22–32)
Calcium: 8.8 mg/dL — ABNORMAL LOW (ref 8.9–10.3)
Chloride: 98 mmol/L (ref 98–111)
Creatinine, Ser: 0.63 mg/dL (ref 0.61–1.24)
GFR, Estimated: >60 mL/min (ref >60)
Glucose, Bld: 115 mg/dL — ABNORMAL HIGH (ref 70–99)
Potassium: 3.7 mmol/L (ref 3.5–5.1)
Sodium: 133 mmol/L — ABNORMAL LOW (ref 135–145)

## 2020-11-20 MED ORDER — IOHEXOL 300 MG/ML  SOLN
75.0000 mL | Freq: Once | INTRAMUSCULAR | Status: AC | PRN
  Administered 2020-11-20: 75 mL via INTRAVENOUS

## 2020-11-20 NOTE — ED Provider Notes (Signed)
Accepted handoff at shift change from [facility]. Please see prior provider note for more detail. His HPI is as below:   45 year old male presents today for evaluation of right-sided neck swelling of 6-week duration that was previously evaluated in this emergency room and treated with antibiotics.  Patient reports he has completed the antibiotic course and has had no relief.  Patient reports the swelling is waxing and waning and he has developed a hoarse voice.  He is able to tolerate p.o. intake without difficulty.  He denies fever, chills, night sweats, or unintentional weight loss.  Reports the swelling waxes and wanes.  He has been using ibuprofen over-the-counter with improvement in swelling from time to time.  He has a 30-pack-year smoking history.  Also endorses frequent heroin use.  Denies other complaints at this time.  Patient presents with his mom but of note he is homeless.  DDX: concern for malignancy of the neck.  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  Patient is still tolerating swallowing both solids and liquids, protecting his airway at this time.  Spoke with Dr. Janace Hoard with ENT who reports that patient should follow-up in outpatient clinic for biopsy, prognosis planning, anticipate probable radiation, chemotherapy, plus or minus excision.  Patient informed of results, discharged with strict return precautions if his ability to breathe, swallow is compromised, and instructed to follow-up with ENT clinic first thing in the morning tomorrow.       Anselmo Pickler, PA-C 11/20/20 1653    Charlesetta Shanks, MD 11/30/20 1711

## 2020-11-20 NOTE — ED Provider Notes (Signed)
[name] Provider Note   CSN: [id number] Arrival date & time: 11/20/20  1111     History Chief Complaint  Patient presents with   Sore Throat    David [name] is a 45 y.o. male.  45 year old male presents today for evaluation of right-sided neck swelling of 6-week duration that was previously evaluated in this emergency room and treated with antibiotics.  Patient reports he has completed the antibiotic course and has had no relief.  Patient reports the swelling is waxing and waning and he has developed a hoarse voice.  He is able to tolerate p.o. intake without difficulty.  He denies fever, chills, night sweats, or unintentional weight loss.  Reports the swelling waxes and wanes.  He has been using ibuprofen over-the-counter with improvement in swelling from time to time.  He has a 30-pack-year smoking history.  Also endorses frequent heroin use.  Denies other complaints at this time.  Patient presents with his mom but of note he is homeless.  The history is provided by the patient. No language interpreter was used.      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[place])    Pneumothorax    Left lung spontaneous pneumothorax at age 58 yr     There are no problems to display for this patient.   Past Surgical History:  Procedure Laterality Date   chest tubes Left        History reviewed. No pertinent family history.  Social History   Tobacco Use   Smoking status: Every Day    Packs/day: 3.00    Types: Cigarettes   Smokeless tobacco: Never  Vaping Use   Vaping Use: Never used  Substance Use Topics   Alcohol use: Not Currently    Alcohol/week: 12.0 standard drinks    Types: 12 Cans of beer per week   Drug use: Yes    Types: IV    Comment: heroin    Home Medications Prior to Admission medications   Medication Sig Start Date End Date Taking? Authorizing Provider  amoxicillin-clavulanate (AUGMENTIN) 875-125 MG  tablet Take 1 tablet by mouth 2 (two) times daily. One po bid x 7 days 11/08/20   Deno Etienne, DO  fluticasone [facility]) 50 MCG/ACT nasal spray Place 2 sprays into both nostrils daily. 10/20/20 11/19/20  Lynden Oxford Scales, PA-C  ibuprofen (ADVIL,MOTRIN) 200 MG tablet Take 600 mg by mouth every 6 (six) hours as needed.    [provider]    Allergies    Patient has no known allergies.  Review of Systems   Review of Systems  Constitutional:  Negative for activity change, appetite change, chills and fever.  HENT:  Positive for voice change. Negative for congestion, drooling and trouble swallowing.   Respiratory:  Negative for shortness of breath.   Gastrointestinal:  Negative for nausea and vomiting.  Musculoskeletal:  Negative for neck stiffness.  Neurological:  Negative for dizziness, weakness and light-headedness.  All other systems reviewed and are negative.  Physical Exam Updated Vital Signs BP 121/86 (BP Location: Right Arm)   Pulse 76   Temp 98.2 F (36.8 C) (Oral)   Resp 18   Ht 5\' 10"  (1.778 m)   Wt 56.7 kg   SpO2 100%   BMI 17.94 kg/m   Physical Exam Vitals and nursing note reviewed.  Constitutional:      General: He is not in acute distress.    Appearance: Normal appearance. He is not ill-appearing.  HENT:     Head: Normocephalic and atraumatic.     Nose: Nose normal.  Eyes:     General: No scleral icterus.    Extraocular Movements: Extraocular movements intact.     Conjunctiva/sclera: Conjunctivae normal.  Neck:     Vascular: No carotid bruit.   Cardiovascular:     Rate and Rhythm: Normal rate and regular rhythm.     Pulses: Normal pulses.     Heart sounds: Normal heart sounds.  Pulmonary:     Effort: Pulmonary effort is normal. No respiratory distress.     Breath sounds: Normal breath sounds. No wheezing or rales.  Abdominal:     General: There is no distension.     Tenderness: There is no abdominal tenderness.  Musculoskeletal:         General: Normal range of motion.     Cervical back: Full passive range of motion without pain and normal range of motion. Normal range of motion.  Skin:    General: Skin is warm and dry.  Neurological:     General: No focal deficit present.     Mental Status: He is alert. Mental status is at baseline.    ED Results / Procedures / Treatments   Labs (all labs ordered are listed, but only abnormal results are displayed) Labs Reviewed  CBC WITH DIFFERENTIAL/PLATELET - Abnormal; Notable for the following components:      Result Value   HCT 38.7 (*)    All other components within normal limits  BASIC METABOLIC PANEL - Abnormal; Notable for the following components:   Sodium 133 (*)    Glucose, Bld 115 (*)    Calcium 8.8 (*)    All other components within normal limits    EKG None  Radiology No results found.  Procedures Procedures   Medications Ordered in ED Medications  iohexol (OMNIPAQUE) 300 MG/ML solution 75 mL (has no administration in time range)    ED Course  I have reviewed the triage vital signs and the nursing notes.  Pertinent labs & imaging results that were available during my care of the patient were reviewed by me and considered in my medical decision making (see chart for details).  Clinical Course as of 11/20/20 1550  Mon Nov 20, 2020  1538 Right sided neck swelling, seen for lymphadenitis, Some voice hoarseness, sx smoking history, heroin use, homelessness CT of neck to r/o malignancy and d/c afterwards  [CP]    Clinical Course User Index [CP] Anselmo Pickler, PA-C   MDM Rules/Calculators/A&P                           44 year old male presents today for evaluation of right-sided neck swelling of 6-week duration.  Patient was previously seen here on 10/12 and discharged with Augmentin without any improvement.  Patient does not have a PCP and returns today for further evaluation.  Patient is concerned for malignancy.  He does have a history of  30-pack-year smoking history and heroin abuse.  He is without fever, chills, unintentional weight loss, night sweats.  CBC without leukocytosis.  BMP with mild hyponatremia at 133, glucose of 115, calcium of 8.8 but otherwise unremarkable.  Will evaluate with CT soft tissue neck.   Patient signed out to oncoming provider for follow up on CT scan and appropriate discharge disposition.   Final Clinical Impression(s) / ED Diagnoses Final diagnoses:  None    Rx / DC Orders ED  Discharge Orders     None        Evlyn Courier, [place] 11/20/20 1551    Lucrezia Starch, MD 11/21/20 2043

## 2020-11-20 NOTE — Discharge Instructions (Signed)
Please return immediately if you begin to have difficulty breathing, difficulty swallowing fluids or food.  Otherwise please call the number I have provided first thing in the morning so that you can schedule an appointment for biopsy of the malignancy in your neck for staging and further discussion of prognosis.

## 2020-11-20 NOTE — ED Triage Notes (Signed)
Pt c/o swelling to right side of neck for over  a month. Was seen here 10/12 and given abx. No improvement. Pain with swallowing.

## 2020-11-23 ENCOUNTER — Telehealth: Payer: Self-pay | Admitting: Radiation Oncology

## 2020-11-23 ENCOUNTER — Other Ambulatory Visit (HOSPITAL_BASED_OUTPATIENT_CLINIC_OR_DEPARTMENT_OTHER): Payer: Self-pay | Admitting: Otolaryngology

## 2020-11-23 DIAGNOSIS — R221 Localized swelling, mass and lump, neck: Secondary | ICD-10-CM

## 2020-11-28 ENCOUNTER — Ambulatory Visit: Payer: Medicaid Other

## 2020-11-28 ENCOUNTER — Ambulatory Visit: Payer: Medicaid Other | Admitting: Radiation Oncology

## 2020-11-28 ENCOUNTER — Other Ambulatory Visit: Payer: Self-pay

## 2020-11-29 ENCOUNTER — Other Ambulatory Visit: Payer: Self-pay

## 2020-11-29 ENCOUNTER — Other Ambulatory Visit: Payer: Self-pay | Admitting: Otolaryngology

## 2020-11-29 ENCOUNTER — Other Ambulatory Visit (HOSPITAL_COMMUNITY): Payer: Self-pay | Admitting: Otolaryngology

## 2020-11-29 DIAGNOSIS — R221 Localized swelling, mass and lump, neck: Secondary | ICD-10-CM

## 2020-11-29 DIAGNOSIS — J387 Other diseases of larynx: Secondary | ICD-10-CM

## 2020-11-29 DIAGNOSIS — R591 Generalized enlarged lymph nodes: Secondary | ICD-10-CM

## 2020-11-29 NOTE — Progress Notes (Signed)
Oncology Nurse Navigator Documentation   Placed introductory call to new referral patient David [name]. I spoke with his mother, David [name].  Introduced myself as the H&N oncology nurse navigator that works with Dr. Isidore Moos to whom he has been referred by Dr. Fredric Dine. She confirmed understanding of referral. Briefly explained my role as his navigator, provided my contact information.  Confirmed understanding of upcoming appts and [place] location, explained arrival and registration process. I explained the purpose of a dental evaluation prior to starting RT, indicated he would be contacted by [facility] to arrange an appt.   I encouraged her and him to call with questions/concerns as he moves forward with appts and procedures.   She verbalized understanding of information provided, expressed appreciation for my call.   Navigator Initial Assessment Employment Status: he is not employed.  Currently on FMLA / STD: no Living Situation: He is homeless due to current substance abuse. He lives in [place]. Support System: He is supported by his mother who lives in [place]. He may be currently living with her during this cancer work up.  PCP: none PCD: none Financial Concerns: yes, will need assistance from financial.  Transportation Needs: yes Sensory Deficits: unknown Language Barriers/Interpreter Needed:  no Ambulation Needs: no DME Used in Home: no Psychosocial Needs:  no Concerns/Needs Understanding Cancer:  addressed/answered by navigator to best of ability Self-Expressed Needs: no   Harlow Asa RN, BSN, OCN Head & Neck Oncology Nurse [place] [facility] Phone # [phone]  Fax # [phone]

## 2020-11-30 ENCOUNTER — Other Ambulatory Visit: Payer: Self-pay | Admitting: Radiology

## 2020-11-30 NOTE — Progress Notes (Signed)
Head and Neck Cancer Location of Tumor / Histology:  CT Neck w/ Contrast  11/20/2020 --IMPRESSION: Oropharynx, hypopharynx, and supraglottic larynx malignancy. Suspect extension to level of vocal cords on right. Significant airway narrowing at level of lower oropharynx and hypopharynx. Metastatic adenopathy on the right including conglomerate right level 2 node.  Patient presented with symptoms of: recently seen in the emergency department on 11/20/2020 for right-sided neck swelling for approximately the last 6 weeks, unresponsive to treatment with oral antibiotics. He had CT neck with contrast at that time, which demonstrated asymmetric soft tissue thickening of the right aspect of the oropharynx extending into the right supraglottis and glottis, as well as enlarged right level 2 lymph node concerning for malignancy  Biopsies revealed:  11/23/2020 --Final Interpretation   NECK MASS, FINE NEEDLE ASPIRATION I (SMEARS AND THIN PREP):              Scattered benign squamous cells and blood.               See comment --Diagnosis Comment   The smears and the thin prep show scattered benign squamous cells in a background of blood. No definitive evidence of dysplasia or carcinoma is identified on the limited sample. Clinical correlation is recommended.  Nutrition Status Yes No Comments  Weight changes? []  [x]    Swallowing concerns? []  [x]  Denies any issues, but does report his throat was more sore this morning (which made swallowing uncomfortable)  PEG? []  [x]     Referrals Yes No Comments  Social Work? [x]  []    Dentistry? [x]  []  Meeting with Dr. Sandi Mariscal later today  Swallowing therapy? [x]  []    Nutrition? [x]  []    Med/Onc? [x]  []     Safety Issues Yes No Comments  Prior radiation? []  [x]    Pacemaker/ICD? []  [x]    Possible current pregnancy? []  [x]  N/a  Is the patient on methotrexate? []  [x]     Tobacco/Marijuana/Snuff/ETOH use: Patient is a current every day smoker (~1 pack/day for  the past 30 years). Denies any current alcohol consumption. States he was able  to get re-established with methadone clinic yesterday to help with drug use (reports he's currently taking 35 mg/mL of Methadone/day)  Past/Anticipated interventions by otolaryngology, if any:  11/23/2020 Dr. Ebbie Latus (initial consult) Complete head neck examination today including flexible nasolaryngoscopy demonstrates effacement of the right piriform sinus mucosa with ulceration appreciated, as well as significant edema of the right arytenoid and false vocal fold; assessment of vocal fold mobility is limited secondary to supraglottic edema.  Patient is eating a regular diet without difficulty, denies shortness of breath, difficulty breathing. FNA of right neck mass was performed today.  Advised patient that if FNA is inconclusive, a surgical biopsy may be necessary. Patient's imaging and pathology results will be reviewed at head neck tumor board on Wednesday.  We briefly discussed options for management of head and neck malignancy with metastasis, including surgery versus XRT +/- chemo; patient became very tearful when discussing surgical treatment.  Patient understands importance of showing up to all scheduled visits, and states that he will work on getting back into the methadone clinic.  Advised patient that if he should experience shortness of breath, difficulty breathing or sudden onset of difficulty swallowing to proceed to [facility] emergency department immediately.  Past/Anticipated interventions by medical oncology, if any:  To be determined if medical oncology referral is appropriate  Current Complaints / other details:   Scheduled for PET scan 12/12/2020. Vocal hoarseness and reports feeling  short of breath quicker with less activity. Sounds to have a congested/non-productive cough

## 2020-11-30 NOTE — Progress Notes (Signed)
Radiation Oncology         [phone] ________________________________  Initial Outpatient Consultation  Name: David [name] MRN: [id number]  Date: 12/01/2020  DOB: 1975/02/03  JJ:[id number], No Pcp Per (Inactive)  [name], [name], DO   REFERRING PHYSICIAN: [name], [name], DO  DIAGNOSIS:    ICD-10-CM   1. 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  HISTORY OF PRESENT ILLNESS::David [name] is a 45 y.o. male who presented to the ED on 11/20/20 with right sided neck swelling for approximately 6 weeks. Patient reported taking oral antibiotics without resolution of symptoms. CT of neck performed during ED evaluation revealed asymmetric soft tissue thickening of the right aspect of the oropharynx, extending into the right supraglottis and glottis, as well as an enlarged right level 2 lymph node concerning for malignancy.  Subsequently, the patient saw  on 11/23/20 who performed laryngoscopy. Findings from the procedure revealed the right piriform sinus as completely effaced with ulceration. Significant edema of the right supraglottis involving the aryepiglottic fold was seen as well, noted arytenoid and likely false vocal fold. Right true vocal fold movement  was unable to be visualized secondary to supraglottic edema.   Fine needle aspiration collected of the neck during visit with Dr. Fredric Dine revealed scattered benign squamous cells in the background of blood. No definitive evidence of dysplasia or carcinoma was  identified on this limited sample. Surgical biopsy will likely be collected in the near future.  Swallowing issues, if any: none  Weight Changes: none  Pain status: none  Other symptoms: Palpable and visible right neck swelling. Patient's mom noted that his vocal quality has changed recently.  Tobacco history, if any: current pack per day smoker with approximate 30-pack-year history  ETOH abuse, if any:  past usage of about 12.0 standard drinks per week.   Drug abuse: patient endorsed daily use of heroin to Dr. Fredric Dine on 11/23/20.  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  PREVIOUS RADIATION THERAPY: No  PAST MEDICAL HISTORY:  has a past medical history of 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[place]) and Pneumothorax.    PAST SURGICAL HISTORY: Past Surgical History:  Procedure Laterality Date   chest tubes Left     FAMILY HISTORY: family history is not on file.  SOCIAL HISTORY:  reports that he has been smoking cigarettes. He has been smoking an average of 3 packs per day. He has never used smokeless tobacco. He reports that he does not currently use alcohol after a past usage of about 12.0 standard drinks per week. He reports that he does not currently use drugs after having used the following drugs: IV.  ALLERGIES: Patient has no known allergies.  MEDICATIONS:  Current Outpatient Medications  Medication Sig Dispense Refill   dexamethasone (DECADRON) 4 MG tablet Take 1 tablet PO BID; take with food. [place]  tablet 0   lidocaine (XYLOCAINE) 2 % solution Patient: Mix 1part 2% viscous lidocaine, 1part H20. Swish & swallow 76m of diluted mixture, 326m before meals and at bedtime, up to QID prn soreness. 200 mL 3   methadone (DOLOPHINE) 10 MG/ML solution Take 35 mg by mouth every 8 (eight)  hours.     omeprazole (PRILOSEC) 20 MG capsule Take 1 capsule (20 mg total) by mouth daily. 30 capsule 0   amoxicillin-clavulanate (AUGMENTIN) 875-125 MG tablet Take 1 tablet by mouth 2 (two) times daily. One po bid x 7 days 14 tablet 0   fluticasone (FLONASE) 50 MCG/ACT nasal spray Place 2 sprays into both nostrils daily. 18 mL 2   ibuprofen (ADVIL,MOTRIN) 200 MG tablet Take 600 mg by mouth every 6 (six) hours as needed.     No current facility-administered medications for this encounter.    REVIEW OF SYSTEMS:  Notable for that above.   PHYSICAL EXAM:  height is 5' 8"  (1.727 m) and weight is 128 lb (58.1 kg). His temperature is 97.9 F (36.6 C). His blood pressure is 107/77 and his pulse is 83. His respiration is 20 and oxygen saturation is 100%.   General: Alert and oriented, in no acute distress.  He is thin.  He is hoarse.  He has noisy breathing. HEENT: Head is normocephalic. Extraocular movements are intact. Oropharynx is notable for suboptimal dentition, no visible tumor in the upper throat, tongue is midline. Neck: Neck is notable for bulky adenopathy at right level 2 of the neck, measuring least 3-4 cm Heart: Regular in rate and rhythm with no murmurs, rubs, or gallops. Chest: Clear to auscultation bilaterally, with no rhonchi, wheezes, or rales. Abdomen: Soft, nontender, nondistended, with no rigidity or guarding. Extremities: No cyanosis or edema. Lymphatics: see Neck Exam Skin: No concerning lesions. Musculoskeletal: symmetric strength and muscle tone throughout. Neurologic: Cranial nerves II through XII are grossly intact. No obvious focalities. Speech is fluent. Coordination is intact.  ECOG = 1  0 - Asymptomatic (Fully active, able to carry on all predisease activities without restriction)  1 - Symptomatic but completely ambulatory (Restricted in physically strenuous activity but ambulatory and able to carry out work of a light or sedentary nature. For example, light  housework, office work)  2 - Symptomatic, <50% in bed during the day (Ambulatory and capable of all self care but unable to carry out any work activities. Up and about more than 50% of waking hours)  3 - Symptomatic, >50% in bed, but not bedbound (Capable of only limited self-care, confined to bed or chair 50% or more of waking hours)  4 - Bedbound (Completely disabled. Cannot carry on any self-care. Totally confined to bed or chair)  5 - Death   OkEustace PenM, Creech RH, Tormey DC, et al. ([id number] "Toxicity and response criteria of the [facility]". AmGrainolancol. 5 (6): 649-55   LABORATORY DATA:  Lab Results  Component Value Date   WBC 8.4 11/20/2020   HGB 13.0 11/20/2020   HCT 38.7 (L) 11/20/2020   MCV 88.6 11/20/2020   PLT 268 11/20/2020   CMP     Component Value Date/Time   NA 133 (L) 11/20/2020 1350   K 3.7 11/20/2020 1350   CL 98 11/20/2020 1350   CO2 27 11/20/2020 1350   GLUCOSE 115 (H) 11/20/2020 1350   BUN 12 11/20/2020 1350   CREATININE 0.63 11/20/2020 1350   CALCIUM 8.8 (L) 11/20/2020 1350   PROT  8.5 (H) 01/25/2008 2040   ALBUMIN 5.2 01/25/2008 2040   AST 108 (H) 01/25/2008 2040   ALT 134 (H) 01/25/2008 2040   ALKPHOS 89 01/25/2008 2040   BILITOT 1.1 01/25/2008 2040   GFRNONAA >60 11/20/2020 1350   GFRAA  01/25/2008 2040    >60        The eGFR has been calculated using the MDRD equation. This calculation has not been validated in all clinical      No results found for: TSH   RADIOGRAPHY: CT Soft Tissue Neck W Contrast  Result Date: 11/20/2020 CLINICAL DATA:  Neck mass, initial workup EXAM: CT NECK WITH CONTRAST TECHNIQUE: Multidetector CT imaging of the neck was performed using the standard protocol following the bolus administration of intravenous contrast. CONTRAST:  34m OMNIPAQUE IOHEXOL 300 MG/ML  SOLN COMPARISON:  None. FINDINGS: Pharynx and larynx: Nasopharynx is unremarkable. There is asymmetric soft tissue thickening the  right lateral wall of the oropharynx. There is involvement of the walls of the oropharynx. Thickening of the aryepiglottic folds. Possible involvement of the epiglottis though lingual surface is likely spared. Piriform sinuses are effaced. Asymmetric supraglottic thickening on the right to the level of laryngeal vestibule. Suspect extension to level of right vocal cords. Significant airway narrowing at level of lower oropharynx and hypopharynx. Involvement of thyroid and laryngeal cartilages is difficult to exclude due to motion. Probable small volume retropharyngeal fluid. Salivary glands: Parotid and submandibular glands Thyroid: Normal. Lymph nodes: Right upper cervical adenopathy. Conglomerate right level 2 node measures 2.4 cm. There is also probable right retropharyngeal adenopathy with limited measurement (approximately 1.4 cm) on series 3, image 45. Vascular: Effacement of right internal jugular vein along adenopathy. Mild calcified plaque at carotid bifurcations. Limited intracranial: No abnormal enhancement. Visualized orbits: Unremarkable. Mastoids and visualized paranasal sinuses: No significant opacification. Skeleton: No significant osseous abnormality. Upper chest: Mild emphysema. IMPRESSION: Oropharynx, hypopharynx, and supraglottic larynx malignancy. Suspect extension to level of vocal cords on right. Significant airway narrowing at level of lower oropharynx and hypopharynx. Metastatic adenopathy on the right including conglomerate right level 2 node. Electronically Signed   By: PMacy MisM.D.   On: 11/20/2020 15:51      IMPRESSION/PLAN:  This is a delightful patient with head and neck cancer which appears to be involving the oropharynx, hypopharynx, supraglottis, and glottis.  This is at least T3 (vs T4a) N3 (likely ECE) disease clinically.  He is adamant about avoiding laryngectomy.  Diagnostic biopsy results are pending because initial biopsy was nondiagnostic.  This is almost definitely  squamous cell carcinoma.  PET scan is pending . I recommend radiotherapy for this patient.  He has a history of drug abuse and is being seen at a methadone clinic. He has advanced disease and ideally would get concurrent chemoradiation but I am not sure if he will be a candidate.  I have contacted medical oncology who will see him in the near future.  We are trying to expedite his workup/treatment before his airway closes off.  I'm going to try to turn around his radiation plan to allow treatment to start around 11/21. He denies any significant respiratory distress but certainly has loud breathing.  I started steroids in hopes that this will decompress the airway to some extent.  Patient is aware of the goals of care (cure) understanding that his comorbidities may make it difficult for him to have the most aggressive possible treatment.  He is adamant that he does not want to undergo laryngectomy  and therefore his best options will be chemoradiation versus definitive radiation over 7 weeks.  He does not want to undergo tracheotomy but understands this may be necessary if his breathing becomes labored; patient advised to go to the emergency room if breathing decompensates.  We discussed the potential risks, benefits, and side effects of radiotherapy. We talked in detail about acute and late effects. We discussed that some of the most bothersome acute effects may be mucositis, dysgeusia, salivary changes, skin irritation, hair loss, dehydration, weight loss and fatigue. We talked about late effects which include but are not necessarily limited to dysphagia, hypothyroidism, nerve injury, vascular injury, spinal cord injury, xerostomia, trismus, neck edema, and potential injury to any of the tissues in the head and neck region. No guarantees of treatment were given. A consent form was signed and placed in the patient's medical record. The patient is enthusiastic about proceeding with treatment. I look forward to  participating in the patient's care.    Simulation (treatment planning) will take place 11/16 (we are working on expediting biopsy and PET scan prior to this)  We also discussed that the treatment of head and neck cancer is a multidisciplinary process to maximize treatment outcomes and quality of life. For this reason the following referrals have been or will be made:   Medical oncology to discuss chemotherapy    Dentistry for dental evaluation, possible extractions in the radiation fields, and /or advice on reducing risk of cavities, osteoradionecrosis, or other oral issues.   Nutritionist for nutrition support during and after treatment.   Speech language pathology for swallowing and/or speech therapy.   Social work for social support.    Physical therapy due to risk of lymphedema in neck and deconditioning.   Baseline labs including TSH.  He will continue to follow closely in his methadone clinic.  He understands that cessation of alcohol and tobacco products and other forms of smoking or vaping are important to have the best possible prognosis.  His focus right now will be on avoiding illicit drug use.  On date of service, in total, I spent 60 minutes on this encounter. Patient was seen in person.  __________________________________________   [name], MD  This document serves as a record of services personally performed by [name], MD. It was created on her behalf by Roney Mans, a trained medical scribe. The creation of this record is based on the scribe's personal observations and the provider's statements to them. This document has been checked and approved by the attending provider.

## 2020-12-01 ENCOUNTER — Encounter: Payer: Self-pay | Admitting: Radiation Oncology

## 2020-12-01 ENCOUNTER — Encounter (HOSPITAL_COMMUNITY): Payer: Self-pay

## 2020-12-01 ENCOUNTER — Other Ambulatory Visit: Payer: Self-pay

## 2020-12-01 ENCOUNTER — Ambulatory Visit
Admission: RE | Admit: 2020-12-01 | Discharge: 2020-12-01 | Disposition: A | Discharge status: Home / Self Care | Payer: Medicaid Other | Source: Ambulatory Visit | Attending: Radiation Oncology | Admitting: Radiation Oncology

## 2020-12-01 ENCOUNTER — Telehealth: Payer: Self-pay | Admitting: Internal Medicine

## 2020-12-01 ENCOUNTER — Telehealth: Payer: Self-pay | Admitting: General Practice

## 2020-12-01 VITALS — BP 107/77 | HR 83 | Temp 97.9°F | Resp 20 | Ht 68.0 in | Wt 128.0 lb

## 2020-12-01 DIAGNOSIS — C328 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: Secondary | ICD-10-CM

## 2020-12-01 DIAGNOSIS — Z79899 Other long term (current) drug therapy: Secondary | ICD-10-CM | POA: Diagnosis not present

## 2020-12-01 DIAGNOSIS — F17210 Nicotine dependence, cigarettes, uncomplicated: Secondary | ICD-10-CM | POA: Insufficient documentation

## 2020-12-01 DIAGNOSIS — J384 Edema of larynx: Secondary | ICD-10-CM | POA: Insufficient documentation

## 2020-12-01 DIAGNOSIS — F1110 Opioid abuse, uncomplicated: Secondary | ICD-10-CM | POA: Diagnosis not present

## 2020-12-01 DIAGNOSIS — J439 Emphysema, unspecified: Secondary | ICD-10-CM | POA: Diagnosis not present

## 2020-12-01 DIAGNOSIS — R5381 Other malaise: Secondary | ICD-10-CM

## 2020-12-01 DIAGNOSIS — R591 Generalized enlarged lymph nodes: Secondary | ICD-10-CM

## 2020-12-01 DIAGNOSIS — R1010 Upper abdominal pain, unspecified: Secondary | ICD-10-CM | POA: Diagnosis not present

## 2020-12-01 MED ORDER — LIDOCAINE VISCOUS HCL 2 % MT SOLN: OROMUCOSAL | 3 refills | Status: DC

## 2020-12-01 MED ORDER — DEXAMETHASONE 4 MG PO TABS: ORAL_TABLET | ORAL | 0 refills | Status: DC

## 2020-12-01 MED ORDER — OMEPRAZOLE 20 MG PO CPDR: 20.0000 mg | DELAYED_RELEASE_CAPSULE | Freq: Every day | ORAL | 0 refills | Status: DC

## 2020-12-01 NOTE — Progress Notes (Signed)
Oncology Nurse Navigator Documentation   Met with patient during initial consult with Dr. Isidore Moos. He was accompanied by his mother, [name].  Further introduced myself as his/their Navigator, explained my role as a member of the Care Team. Provided New Patient Information packet: Contact information for physician, this navigator, other members of the Care Team Advance Directive information ([place] blue pamphlet with LCSW insert); provided [facility] AD booklet at his request,  Fall Prevention Patient [place] Information sheet Symptom Management Clinic information [facility] campus map with highlight of [place] SLP Information sheet Provided and discussed educational handouts for PEG and PAC. Assisted with post-consult appt scheduling. I explained the location of Dr. Raynelle Dick office and [facility] Radiology as reference for future appts, including arrival procedure for these appts.   They verbalized understanding of information provided. I encouraged them to call with questions/concerns moving forward.  Harlow Asa, RN, BSN, OCN Head & Neck Oncology Nurse [place] [facility] [place] [phone]

## 2020-12-01 NOTE — Progress Notes (Signed)
Mccade, Sullenberger Legal Sex  Male DOB  May 12, 1975 SSN  [id number] Address  [address]  [place] [address] Phone  [phone] [facility])  [phone] (Mobile) *Preferred*    RE: [name] CORE BIOPSY Received: Meriel Pica, MD  Valli Glance [place] for US guided R Cx neck mass Bx.   Neck CT - 11/20/20 - image 62, series 3.   Cathren Harsh        Previous Messages   ----- Message -----  From: Valli Glance  Sent: 11/29/2020   6:30 PM EDT  To: [facility] Procedure Requests  Subject: [name] CORE BIOPSY                                 [name] CORE BIOPSY (SOFT TISSUE)     Reason: Neck mass, Supraglottic mass     History: CT in computer done 11/20/20     Provider: Jason Coop

## 2020-12-01 NOTE — Telephone Encounter (Signed)
[place] CSW Progress Notes  Called patient per referral for distress screen and any additional issues.  No answer, left generic VM w my contact information and encouragement to call back at his convenience.  Edwyna Shell, LCSW Clinical Social Worker Phone:  [phone]

## 2020-12-01 NOTE — Telephone Encounter (Signed)
Scheduled appt per 11/4 referral. Pt's mother is aware of appt date and time.

## 2020-12-04 ENCOUNTER — Encounter: Payer: Self-pay | Admitting: *Deleted

## 2020-12-04 ENCOUNTER — Encounter (HOSPITAL_COMMUNITY): Payer: Self-pay | Admitting: Dentistry

## 2020-12-04 ENCOUNTER — Other Ambulatory Visit: Payer: Self-pay | Admitting: Medical Oncology

## 2020-12-04 ENCOUNTER — Other Ambulatory Visit: Payer: Self-pay

## 2020-12-04 ENCOUNTER — Ambulatory Visit (INDEPENDENT_AMBULATORY_CARE_PROVIDER_SITE_OTHER): Payer: Medicaid Other | Admitting: Dentistry

## 2020-12-04 ENCOUNTER — Encounter: Payer: Self-pay | Admitting: Radiation Oncology

## 2020-12-04 VITALS — BP 109/79 | HR 75 | Temp 98.5°F

## 2020-12-04 DIAGNOSIS — C328 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: Secondary | ICD-10-CM | POA: Diagnosis present

## 2020-12-04 DIAGNOSIS — K011 Impacted teeth: Secondary | ICD-10-CM | POA: Insufficient documentation

## 2020-12-04 DIAGNOSIS — K045 Chronic apical periodontitis: Secondary | ICD-10-CM | POA: Insufficient documentation

## 2020-12-04 DIAGNOSIS — K036 Deposits [accretions] on teeth: Secondary | ICD-10-CM | POA: Insufficient documentation

## 2020-12-04 DIAGNOSIS — R591 Generalized enlarged lymph nodes: Secondary | ICD-10-CM

## 2020-12-04 DIAGNOSIS — Z01818 Encounter for other preprocedural examination: Secondary | ICD-10-CM | POA: Diagnosis not present

## 2020-12-04 DIAGNOSIS — K083 Retained dental root: Secondary | ICD-10-CM

## 2020-12-04 DIAGNOSIS — K030 Excessive attrition of teeth: Secondary | ICD-10-CM | POA: Insufficient documentation

## 2020-12-04 DIAGNOSIS — K029 Dental caries, unspecified: Secondary | ICD-10-CM | POA: Insufficient documentation

## 2020-12-04 DIAGNOSIS — C329 Malignant neoplasm of larynx, unspecified: Secondary | ICD-10-CM | POA: Insufficient documentation

## 2020-12-04 DIAGNOSIS — K0530 Chronic periodontitis, unspecified: Secondary | ICD-10-CM | POA: Insufficient documentation

## 2020-12-04 DIAGNOSIS — K08109 Complete loss of teeth, unspecified cause, unspecified class: Secondary | ICD-10-CM | POA: Insufficient documentation

## 2020-12-04 NOTE — Progress Notes (Signed)
Department of Dental Medicine             OUTPATIENT CONSULT   Service Date:   12/04/2020  Patient Name:   David [name] Date of Birth:   1975-02-15 Medical Record Number: [id number]  Referring Provider:                [name], M.D.   PLAN/RECOMMENDATIONS:   Assessment There are no current signs of acute odontogenic infection including abscess, edema or erythema, or suspicious lesion requiring biopsy.   Rampant decay in all 4 quadrants, bone loss/accretions, periodontal concerns, retained root tip #31.  Recommendations No dental intervention indicated prior to radiation at this time.   Plan Discuss case with medical team and coordinate treatment as needed. Return for delivery of scatter protection devices and then follow-up after completion of radiation therapy.  Discussed in detail all treatment options and recommendations with the patient and they are agreeable to the plan.    Thank you for consulting with [facility] and for the opportunity to participate in this patient's treatment.  Should you have any questions or concerns, please contact the [facility] at [phone].   12/04/2020 Consult Note:  COVID-19 SCREENING:  The patient denies symptoms concerning for COVID-19 infection including fever, chills, cough, or newly developed shortness of breath.   HISTORY OF PRESENT ILLNESS: David [name] is a very pleasant 45 y.o. 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  The patient presents today for a medically necessary dental consultation as part of their pre-radiation therapy work-up.   DENTAL HISTORY: The patient reports that it has been a while since he has last seen a dentist.  He says that within the last year his  teeth have been breaking off and he has felt holes in his teeth and food getting stuck which is aggravating.  He currently denies any dental/orofacial pain or sensitivity. Patient is able to manage oral secretions.  Patient denies dysphagia, odynophagia, SOB and neck pain.  Patient denies fever, rigors and malaise.   CHIEF COMPLAINT:  Here for a pre-head and neck radiation dental exam.   Patient Active Problem List   Diagnosis Date Noted  . 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 ([place]) 12/04/2020   Past Medical History:  Diagnosis Date  . 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[place])   . Pneumothorax    Left lung spontaneous pneumothorax at age 81 yr    Past Surgical History:  Procedure Laterality Date  . chest tubes Left    No Known Allergies Current Outpatient Medications  Medication Sig Dispense Refill  . amoxicillin-clavulanate (AUGMENTIN) 875-125 MG tablet Take 1 tablet by mouth 2 (two) times daily. One po bid x 7 days 14 tablet 0  . dexamethasone (DECADRON) 4 MG tablet Take 1 tablet PO BID; take with food. 40 tablet 0  . fluticasone (FLONASE) 50 MCG/ACT nasal spray Place 2 sprays into both nostrils daily. 18 mL 2  . ibuprofen (ADVIL,MOTRIN) 200 MG tablet Take 600 mg by mouth every 6 (six) hours as needed.    . lidocaine (XYLOCAINE) 2 % solution Patient: Mix 1part 2% viscous lidocaine, 1part H20. Swish & swallow 30m of diluted mixture, 370m before meals and at bedtime, up to QID prn soreness. 200 mL 3  . methadone (DOLOPHINE) 10 MG/ML  solution Take 35 mg by mouth every 8 (eight) hours.    Marland Kitchen omeprazole (PRILOSEC) 20 MG capsule Take 1 capsule (20 mg total) by mouth daily. 30 capsule 0   No current facility-administered medications for this visit.    LABS: Lab Results  Component Value Date   WBC 8.4 11/20/2020   HGB 13.0 11/20/2020   HCT 38.7 (L) 11/20/2020   MCV 88.6 11/20/2020   PLT 268 11/20/2020      Component Value Date/Time   NA 133 (L) 11/20/2020 1350   K 3.7 11/20/2020  1350   CL 98 11/20/2020 1350   CO2 27 11/20/2020 1350   GLUCOSE 115 (H) 11/20/2020 1350   BUN 12 11/20/2020 1350   CREATININE 0.63 11/20/2020 1350   CALCIUM 8.8 (L) 11/20/2020 1350   GFRNONAA >60 11/20/2020 1350   GFRAA  01/25/2008 2040    >60        The eGFR has been calculated using the MDRD equation. This calculation has not been validated in all clinical   No results found for: INR, PROTIME No results found for: PTT  Social History   Socioeconomic History  . Marital status: Divorced    Spouse name: Not on file  . Number of children: Not on file  . Years of education: Not on file  . Highest education level: Not on file  Occupational History  . Not on file  Tobacco Use  . Smoking status: Every Day    Packs/day: 3.00    Types: Cigarettes  . Smokeless tobacco: Never  Vaping Use  . Vaping Use: Never used  Substance and Sexual Activity  . Alcohol use: Not Currently    Alcohol/week: 12.0 standard drinks    Types: 12 Cans of beer per week  . Drug use: Not Currently    Types: IV    Comment: 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)  . Sexual activity: Not on file  Other Topics Concern  . Not on file  Social History Narrative  . Not on file   Social Determinants of Health   Financial Resource Strain: Not on file  Food Insecurity: Not on file  Transportation Needs: Not on file  Physical Activity: Not on file  Stress: Not on file  Social Connections: Not on file  Intimate Partner Violence: Not on file   No family history on file.    REVIEW OF SYSTEMS:  Reviewed with the patient as per HPI. Psych:  Patient denies having dental phobia.   VITAL SIGNS: BP 109/79 (BP Location: Right Arm, Patient Position: Sitting, Cuff Size: Normal)   Pulse 75   Temp 98.5 F (36.9 C) (Oral)    PHYSICAL EXAM: General:  Well-developed, comfortable and in no apparent distress. Neurological:  Alert and oriented to person, place and  time. Extraoral:  Facial  symmetry present without any edema or erythema.  TMJ asymptomatic without clicks or crepitations.  (+) Lymphadenopathy: Neck notable for palpable & visible mass on the right side.    Maximum Interincisal Opening:  35 mm Intraoral:  Soft tissues appear well-perfused and mucous membranes moist.  FOM and vestibules soft and not raised. Oral cavity without mass or lesion. No signs of infection, parulis, sinus tract, edema or erythema evident upon exam.   DENTAL EXAM: Hard tissue exam completed and charted.    Overall impression:  Poor remaining dentition.    Oral hygiene:  Poor   Periodontal:  Inflamed and erythematous gingival tissue. Generalized plaque and calculus accumulation.  Caries:  #2, #3, #4, #5, #6, #7, #8, #9, #10, #11, #12, #13, #14, #15, #19, #20, #21, #22, #23, #26, #27, #28, #29, #30  Retained root tips:  #31 Occlusion:  Unable to assess molar occlusion.   (+) Non-functional teeth:  #2, #15, #16 (+) Supra-erupted teeth: #2, #15 Other findings:   (+) Attrition/wear: #23, #24, #25 (+) Rotation: #4 and #13  (+) Impacted tooth: #16   RADIOGRAPHIC EXAM:  PAN and Full Mouth Series exposed and interpreted.    Condyles seated bilaterally in fossas.  No evidence of abnormal pathology.  All visualized osseous structures appear WNL. #16 presents & appears partial bony or soft tissue impacted. #31 retained root tip with periapical radiolucency. #2 appears supra-erupted.    Generalized mild horizontal bone loss with areas of localized moderate consistent with mild to moderate periodontitis. Radiographic calculus accumulation evident.     Missing teeth #1, #17 & #32. Existing restorations evident on teeth #'s 3, 14, 15, 18, 19, 30. Caries- #640M, #40M&D, #42M&D, #867M&D, #67M&D, #86M&D, #2104M&D, #104M&D, #67M&D, #62M&D, #1640M&D, #140M&D, #142M&D, #1867M, #167M, #1104M, #54M&D, #33M&D, #22D, #23D, #26D, #286M&D, #22104M&D, #2104M&D, #65M&D. #7, #9, #10, 12, 13, 29 & 30 have deep decay approximating the  pulp.   ASSESSMENT:  1.  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 2.  Pre-radiation therapy dental exam 3.  Missing teeth 4.  Caries 5.  Retained root tip 6.  Chronic apical periodontitis 7.  Impacted tooth 8.  Attrition/wear 9.  Accretions on teeth 10.  Periodontitis 11.  Incipient lesions   PROCEDURES:  The common and significant side effects of radiation therapy to the head and neck were explained and discussed with the patient.  The discussion included side effects of trismus (limited opening), dysgeusia (loss of taste), xerostomia (dry mouth), radiation caries and osteoradionecrosis of the jaw.  I also discussed the importance of maintaining optimal oral hygiene and oral health before, during and after radiation to decrease the risk of developing radiation cavities and the need for any surgery such as extractions after therapy.    Upper and Lower alginate impressions taken and poured up in Type IV [employer] for fabrication of scatter protection devices and fluoride trays.   Trismus appliance made using patient's baseline M.I.O. (19 sticks).  Leta Speller, DAII demonstrated use of appliance.  Verbal and written postop instructions were given to the patient.   PLAN AND RECOMMENDATIONS: I discussed the risks, benefits, and complications of various scenarios with the patient in relationship to their medical and dental conditions, which included systemic infection or other serious issues such as osteoradionecrosis that could potentially occur either before, during or after their anticipated radiation therapy if dental/oral concerns are not addressed.  I explained that if any chronic or acute dental/oral infection(s) are addressed and subsequently not maintained following medical optimization and recovery, their risk of the previously mentioned complications are just as high and could potentially occur postoperatively.  I explained all significant findings of the dental  consultation with the patient including several teeth (nearly all teeth) with cavities, some being larger than others, inflamed/irritated gums due to calculus/tartar build-up on teeth and generalized bone loss/periodontal concerns, and the recommended care including routine dental visits (periodic exams, cleanings/deep scaling and root planing, restorative & all other indicated dental treatment) after he finishes with radiation in order to optimize their oral health. I explained that he does have some bad teeth that will also require extraction, however they are not in the radiation field and  due to the urgency of starting his treatment, these issues can be addressed after he finishes therapy. The patient verbalized understanding of all findings, discussion, and recommendations. We then discussed various treatment options to include no treatment, multiple extractions with alveoloplasty, pre-prosthetic surgery as indicated, periodontal therapy, dental restorations, root canal therapy, crown and bridge therapy, implant therapy, and replacement of missing teeth as indicated.  The patient verbalized understanding of all options, and currently wishes to proceed with routine dental care as recommended which includes establishing care at a dental office of his choice once he is done with cancer treatment and is medically optimized.  I explained that we will see him at least once more for a follow-up appointment after he finishes radiation in our hospital dental clinic. He verbalized understanding and is agreeable to the plan. Plan to discuss all findings and recommendations with medical team and coordinate future care as needed.  The patient will return for delivery of scatter protection devices on 11/16. The patient will need to establish care at a dental office of his choice for routine dental care including replacement of missing teeth as needed, cleanings and exams.  All questions and concerns were invited and  addressed.  The patient tolerated today's visit well and departed in stable condition.  I spent in excess of 120 minutes during the conduct of this consultation and >50% of this time involved direct face-to-face encounter for counseling and/or coordination of the patient's care.      Kingston Benson Norway, D.M.D.

## 2020-12-04 NOTE — Patient Instructions (Signed)
[facility] Department of Dental Medicine Deshia Vanderhoof B. Seairra Otani, D.M.D. Phone: [phone] Fax: [phone]   It was a pleasure seeing you today!  Please refer to the information below regarding your dental visit with us.  Call if you have any questions or concerns that come up after you leave.   Thank you for letting us provide care for you.  If there is anything we can do for you, please let us know.    RADIATION THERAPY AND INFORMATION REGARDING YOUR TEETH   XEROSTOMIA (DRY MOUTH):  Your salivary glands may be in the field of radiation.  Radiation may include all or only part of your salivary glands.  This will cause your saliva to dry up, and you will have a dry mouth.  The dry mouth will be for the rest of your life unless your radiation oncologist tells you otherwise.  Your saliva has many functions: It wets your tongue for speaking. It coats your teeth and the inside of your mouth for easier movement. It helps with chewing and swallowing food. It helps clean away harmful acid and toxic products made by the germs in your mouth, therefore it helps prevent cavities. It kills some germs in your mouth and helps to prevent gum disease. It helps to carry flavor to your taste buds.  Once you have lost your saliva, you will be at higher risk for tooth decay and gum disease.    What can be done to help improve your mouth when there's not enough saliva? Your dentist may give a recommendation for CLoSYS.  It will not bring back all of your saliva but may bring back some of it.  Also, your saliva may be thick and ropy or white and foamy.  It will not feel like it use to feel. You will need to swish with water every time your mouth feels dry.  YOU CANNOT suck on any cough drops, mints, lemon drops, candy, vitamin C or any other products.  You cannot use anything other than water to make your mouth feel less dry.  If you want to drink anything else, you have to drink it all at once and brush  afterwards.  Be sure to discuss the details of your diet habits with your dentist or hygienist.   RADIATION CARIES:  This is decay (cavities) that happens very quickly once your mouth is very dry due to radiation therapy.  Normally, cavities take six months to two years to become a problem.  When you have dry mouth, cavities may take as little as eight weeks to cause you a problem.    Dental check-ups every two months are necessary as long as you have a dry mouth. Radiation caries typically, but not always, start at your gum line where it is hard to see the cavity.  It is therefore also hard to fill these cavities adequately.  This high rate of cavities happens because your mouth no longer has saliva and therefore the acid made by the germs starts the decay process.  Whenever you eat anything the germs in your mouth change the food into acid.  The acid then burns a small hole in your tooth.  This small hole is the beginning of a cavity.  If this is not treated then it will grow bigger and become a cavity.  The way to avoid this hole getting bigger is to use fluoride every evening as prescribed by your dentist following your radiation. NOTE:  You have to make sure   that your teeth are very clean before you use the fluoride.  This fluoride in turn will strengthen your teeth and prepare them for another day of fighting acid. If you develop radiation caries many times, the damage is so large that you will have to have all your teeth removed.  This could be a big problem if some of these teeth are in the field of radiation.  Further details of why this could be a big problem will follow (see Osteoradionecrosis below).   DYSGEUSIA (LOSS OF TASTE):  This happens to varying degrees once you've had radiation therapy to your jaw region.  Many times taste is not completely lost, but becomes limited.  The loss of taste is mostly due to radiation affecting your taste buds.  However, if you have no saliva in your mouth  to carry the flavor to your taste buds, it would be difficult for your taste buds to taste anything.  That is why using water or a prescription for Salagen prior to meals and during meal times may help with some of the taste.  Keep in mind that taste generally returns very slowly over the course of several months or several years after radiation therapy.  Don't give up hope.   TRISMUS (LIMITED JAW OPENING):  According to your radiation oncologist, your TMJ or jaw joints are going to be partially or fully in the field of radiation.  This means that over time the muscles that help you open and close your mouth may get stiff.  This will potentially result in your not being able to open your mouth wide enough or as wide as you can open it now.    Let me give you an example of how slowly this happens and how unaware people are of it:   A gentlemen that had radiation therapy two years ago came back to me complaining that bananas are just too large for him to be able to fit them in between his teeth.  He was not able to open wide enough to bite into a banana.  This happens slowly and over a period of time.  What we do to try and prevent this:   Your dentist will probably give you a stack of sticks called a trismus exercise device.  This stack will help remind your muscles and your jaw joints to open up to the same distance every day.  Use these sticks every morning when you wake up, or according to the instructions given by your dentist.    You must use these sticks for at least one to two years after radiation therapy.  The reason for that is because it happens so slowly and keeps going on for about two years after radiation therapy.  Your hospital dentist will help you monitor your mouth opening and make sure that it's not getting smaller after radiation.  TRISMUS EXERCISES: Using the stack of sticks given to you by your dentist, place the stack in your mouth and hold onto the other end for support. Leave  the sticks in your mouth while holding the other end.  Allow 30 seconds for muscle stretching. Rest for a few seconds. Repeat 3-5 times. This exercise is recommended in the mornings and evenings unless otherwise instructed. The exercise should be done for a period of 2 YEARS after the end of radiation. Your maximum jaw opening should be checked regularly at recall dental visits by your general dentist. You should report any changes, soreness, or difficulties encountered   when doing the exercises to your dentist.   OSTEORADIONECROSIS (ORN):  This is a condition where your jaw bone after radiation therapy becomes very dry.  It has very little blood supply to keep it alive.  If you develop a cavity that turns into an abscess or an infection, then the jaw bone does not have enough blood supply to help fight the infection.  At this point it is very likely that the infection could cause the death of your jaw bone.  When you have dead bone it has to be removed.  Therefore, you might end up having to have surgery to remove part of your jaw bone, the part of the jaw bone that has been affected.     Healing is also a problem if you are to have surgery (like a tooth extraction) in the areas where the bone has had radiation therapy.  If you have surgery, you need more blood supply to heal which is not available.  When blood supply and oxygen are not available, there is a chance for the bone to die. Occasionally, ORN happens on its own with no obvious reason, but this is quite rare.  We believe that patients who continue to smoke and/or drink alcohol have a higher chance of having this problem. Once your jaw bone has had radiation therapy, if there are any remaining teeth in that area, it is not recommended to have them pulled unless your dentist or oral surgeon is aware of your history of radiation and believes it is safe.  The risks for ORN either from infection or spontaneously occurring (with no reason) are life  long.   QUESTIONS? Call our office during office hours at [phone].  

## 2020-12-04 NOTE — Progress Notes (Signed)
Dental Form with Estimates of Radiation Dose      Diagnosis:    ICD-10-CM   1. 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)  C32.8 dexamethasone (DECADRON) 4 MG tablet    omeprazole (PRILOSEC) 20 MG capsule    lidocaine (XYLOCAINE) 2 % solution    Ambulatory referral to Social Work      Prognosis: Cancer Staging 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 [facility]) Staging form: Larynx - Supraglottis, AJCC 8th Edition - Clinical stage from 12/01/2020: cT3, cN3 - Unsigned Stage prefix: Initial diagnosis   Anticipated # of fractions: 35    Daily?: yes  # of weeks of radiotherapy: 7  Chemotherapy?: ?  Anticipated xerostomia:  Mild permanent   Pre-simulation needs:  Scatter protection    Simulation: ASAP 11/16 due to impending airway closure. Starting ~11/21  Other Notes: Treatment is rather urgent due to delay in diagnosis and advanced disease.  I will do my best to move the radiation away from his tooth roots  Please contact [name], MD, with patient's disposition after evaluation and/or dental treatment.

## 2020-12-04 NOTE — Progress Notes (Signed)
[place] Psychosocial Distress Screening Clinical Social Work  Clinical Social Work was referred by distress screening protocol.  The patient scored a 8 on the Psychosocial Distress Thermometer which indicates moderate distress. Clinical Social Worker contacted patient by phone to assess for distress and other psychosocial needs.  CSW spoke with patient and patients mother.  Patient stated he was doing "the best be could" and described feeling overwhelmed by the treatment planning process.  CSW and patient discussed common feelings and emotions when being diagnosed with cancer and the importance of support.  CSW provided education on CSW role and the support team at [facility].  Patient stated that prior to diagnosis he was homeless and living in a tent.  He is currently staying with his mother in [place].  Patient stated he has been working with [place] with [employer] [employer] for almost a year.  Patient has an appointment to see Angie ([place]) on Wednesday (11/9) at 1:00.  Patient's mother stated they applied for Medicaid approximately 2 weeks ago,and patient is receiving food stamps.  CSW, patient and patients mother discussed social security disability and resources through the [employer].  Once staging and treatment planning is complete CSW and patient can review appropriate resources and referrals.  CSW will provided contact information for patient at his medical oncology appointment tomorrow (11/8).  CSW encouraged patient to share information with Angie on Wednesday.  CSW team will continue to follow and support as needed.        ONCBCN DISTRESS SCREENING 12/01/2020  Screening Type Initial Screening  Distress experienced in past week (1-10) 8  Family Problem type Other (comment)  Emotional problem type Adjusting to illness;Isolation/feeling alone;Feeling hopeless;Boredom  Physical Problem type Pain;Getting around;Mouth sores/swallowing  Physician notified of physical symptoms Yes   Referral to clinical psychology No  Referral to clinical social work Yes  Referral to dietition Yes  Referral to financial advocate Yes  Referral to support programs Yes  Referral to palliative care No    Clinical Social Worker follow up needed: Yes.     Johnnye Lana, MSW, LCSW, OSW-C Clinical Social Worker [facility] [phone]         Jakita Dutkiewicz P, LCSW

## 2020-12-05 ENCOUNTER — Inpatient Hospital Stay: Payer: Medicaid Other

## 2020-12-05 ENCOUNTER — Inpatient Hospital Stay (HOSPITAL_BASED_OUTPATIENT_CLINIC_OR_DEPARTMENT_OTHER): Payer: Medicaid Other | Admitting: Internal Medicine

## 2020-12-05 ENCOUNTER — Encounter: Payer: Self-pay | Admitting: Internal Medicine

## 2020-12-05 VITALS — BP 130/85 | HR 72 | Temp 97.0°F | Resp 18 | Ht 68.0 in | Wt 129.7 lb

## 2020-12-05 DIAGNOSIS — F1910 Other psychoactive substance abuse, uncomplicated: Secondary | ICD-10-CM

## 2020-12-05 DIAGNOSIS — Z79899 Other long term (current) drug therapy: Secondary | ICD-10-CM | POA: Insufficient documentation

## 2020-12-05 DIAGNOSIS — C328 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: Secondary | ICD-10-CM

## 2020-12-05 DIAGNOSIS — Z510 Encounter for antineoplastic radiation therapy: Secondary | ICD-10-CM | POA: Diagnosis not present

## 2020-12-05 DIAGNOSIS — R591 Generalized enlarged lymph nodes: Secondary | ICD-10-CM

## 2020-12-05 DIAGNOSIS — R5381 Other malaise: Secondary | ICD-10-CM

## 2020-12-05 DIAGNOSIS — Z5111 Encounter for antineoplastic chemotherapy: Secondary | ICD-10-CM | POA: Insufficient documentation

## 2020-12-05 DIAGNOSIS — R5383 Other fatigue: Secondary | ICD-10-CM

## 2020-12-05 LAB — CBC WITH DIFFERENTIAL (CANCER CENTER ONLY)
Abs Immature Granulocytes: 0.13 10*3/uL — ABNORMAL HIGH (ref 0.00–0.07)
Basophils Absolute: 0 10*3/uL (ref 0.0–0.1)
Basophils Relative: 0 %
Eosinophils Absolute: 0 10*3/uL (ref 0.0–0.5)
Eosinophils Relative: 0 %
HCT: 38 % — ABNORMAL LOW (ref 39.0–52.0)
Hemoglobin: 12.2 g/dL — ABNORMAL LOW (ref 13.0–17.0)
Immature Granulocytes: 1 %
Lymphocytes Relative: 7 %
Lymphs Abs: 0.9 10*3/uL (ref 0.7–4.0)
MCH: 28.6 pg (ref 26.0–34.0)
MCHC: 32.1 g/dL (ref 30.0–36.0)
MCV: 89.2 fL (ref 80.0–100.0)
Monocytes Absolute: 1.1 10*3/uL — ABNORMAL HIGH (ref 0.1–1.0)
Monocytes Relative: 8 %
Neutro Abs: 11.3 10*3/uL — ABNORMAL HIGH (ref 1.7–7.7)
Neutrophils Relative %: 84 %
Platelet Count: 282 10*3/uL (ref 150–400)
RBC: 4.26 MIL/uL (ref 4.22–5.81)
RDW: 14 % (ref 11.5–15.5)
WBC Count: 13.5 10*3/uL — ABNORMAL HIGH (ref 4.0–10.5)
nRBC: 0 % (ref 0.0–0.2)

## 2020-12-05 LAB — CMP (CANCER CENTER ONLY)
ALT: 30 U/L (ref 0–44)
AST: 21 U/L (ref 15–41)
Albumin: 3.4 g/dL — ABNORMAL LOW (ref 3.5–5.0)
Alkaline Phosphatase: 74 U/L (ref 38–126)
Anion gap: 6 (ref 5–15)
BUN: 21 mg/dL — ABNORMAL HIGH (ref 6–20)
CO2: 31 mmol/L (ref 22–32)
Calcium: 9.2 mg/dL (ref 8.9–10.3)
Chloride: 99 mmol/L (ref 98–111)
Creatinine: 0.68 mg/dL (ref 0.61–1.24)
GFR, Estimated: >60 mL/min (ref >60)
Glucose, Bld: 106 mg/dL — ABNORMAL HIGH (ref 70–99)
Potassium: 4.3 mmol/L (ref 3.5–5.1)
Sodium: 136 mmol/L (ref 135–145)
Total Bilirubin: 0.3 mg/dL (ref 0.3–1.2)
Total Protein: 8.1 g/dL (ref 6.5–8.1)

## 2020-12-05 LAB — TSH: TSH: <0.08 u[IU]/mL — ABNORMAL LOW (ref 0.320–4.118)

## 2020-12-05 NOTE — Progress Notes (Signed)
[place] [phone]   [phone]  CONSULT NOTE  REFERRING PHYSICIAN: Dr. [name]  REASON FOR CONSULTATION:  45 years old white male with highly suspicious head and neck cancer  HPI David [name] is a 45 y.o. male with past medical history significant for multiple drug abuse including heroin addiction as well as history of pneumothorax at age 76 status post chest tube placement at that time.  The patient also has a long history of smoking.  He mentions that few weeks ago he noticed a lump on the right side of the neck.  He was seen at one of the urgent care center and treated with nasal spray as well as steroids and antibiotics with no improvement.  He presented to the emergency department for evaluation and CT scan of the neck with contrast was performed on November 20, 2020 and it showed asymmetric soft tissue thickening of the right lateral wall of the oropharynx with involvement of the wall of the oropharynx and thickening of the area of of the aryepiglottic folds with possible involvement of the epiglottis but sparing of the lingual surface.  There was a symmetric supraglottic thickening on the right to the level of the laryngeal vestibule with suspected extension to the level of the right vocal cords and significant airway narrowing at level of lower oropharynx and hypopharynx.  There was also right upper cervical adenopathy with conglomerate right level 2 node measuring 2.4 cm and the probable right retropharyngeal adenopathy approximately 1.4 cm.  The patient was seen by by Dr. Ebbie Latus with ENT and she underwent flexible laryngoscopy with fine-needle aspiration of the neck mass that showed scattered benign squamous cells with no clear malignancy on this biopsy. The patient is scheduled for ultrasound-guided core biopsy of the neck mass by interventional radiology on December 08, 2020.  He is also scheduled for a PET scan on December 12, 2020. He was seen by Dr. Isidore Moos and she recommended for him a course of radiotherapy for preservation of his larynx and vocal cords. She referred him to me today for evaluation and recommendation regarding concurrent radiotherapy. When seen today the patient is feeling fine with less pain on the right side of the neck but he continues to have hoarseness of his voice.  He denied having any significant chest pain or shortness of breath but has mild cough with no hemoptysis.  He has no significant weight loss or night sweats.  He has no headache or visual changes.  He has a lot of social issues dealing with his mother. The patient is single and has no children.  He used to work as a Archivist.  He was homeless for the last few years.  He currently lives with his mother and there are a lot of social issues there.  He was accompanied today by [name].  The patient has a history of smoking up to 5 packs/day for many years but he currently smokes 1 pack/day.  Total history of smoking is around 30 years.  The patient also used to drink alcohol at regular basis but not recently.  He has significant history of drug abuse including oxycodone and recently heroin.  He is also followed by the pain clinic and currently on methadone.   HPI  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[place])    Pneumothorax    Left lung spontaneous pneumothorax at age 35 yr     Past Surgical  History:  Procedure Laterality Date   chest tubes Left     No family history on file.  Social History Social History   Tobacco Use   Smoking status: Every Day    Packs/day: 3.00    Types: Cigarettes   Smokeless tobacco: Never  Vaping Use   Vaping Use: Never used  Substance Use Topics   Alcohol use: Not Currently    Alcohol/week: 12.0 standard drinks    Types: 12 Cans of beer per week   Drug use: Not Currently    Types: IV    Comment: 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)    No Known  Allergies  Current Outpatient Medications  Medication Sig Dispense Refill   amoxicillin-clavulanate (AUGMENTIN) 875-125 MG tablet Take 1 tablet by mouth 2 (two) times daily. One po bid x 7 days (Patient not taking: Reported on 12/05/2020) 14 tablet 0   dexamethasone (DECADRON) 4 MG tablet Take 1 tablet PO BID; take with food. 40 tablet 0   fluticasone (FLONASE) 50 MCG/ACT nasal spray Place 2 sprays into both nostrils daily. (Patient not taking: Reported on 12/05/2020) 18 mL 2   ibuprofen (ADVIL,MOTRIN) 200 MG tablet Take 600 mg by mouth every 6 (six) hours as needed for moderate pain.     lidocaine (XYLOCAINE) 2 % solution Patient: Mix 1part 2% viscous lidocaine, 1part H20. Swish & swallow 6mL of diluted mixture, 79min before meals and at bedtime, up to QID prn soreness. 200 mL 3   methadone (DOLOPHINE) 10 MG/ML solution Take 45 mg by mouth daily.     omeprazole (PRILOSEC) 20 MG capsule Take 1 capsule (20 mg total) by mouth daily. 30 capsule 0   No current facility-administered medications for this visit.    Review of Systems  Constitutional: positive for fatigue Eyes: negative Ears, nose, mouth, throat, and face: positive for hoarseness Respiratory: positive for dyspnea on exertion Cardiovascular: negative Gastrointestinal: negative Genitourinary:negative Integument/breast: negative Hematologic/lymphatic: positive for lymphadenopathy Musculoskeletal:negative Neurological: negative Behavioral/Psych: negative Endocrine: negative Allergic/Immunologic: negative  Physical Exam  WUJ:WJXBJ, healthy, no distress, well nourished, and well developed SKIN: skin color, texture, turgor are normal, no rashes or significant lesions HEAD: Normocephalic, large right sided neck mass. EYES: normal, PERRLA EARS: External ears normal, Canals clear OROPHARYNX:no exudate and no erythema  NECK: large tender anterior cervical adenopathy on the right side LYMPH:  enlarged lymph nodes palpated in the  right neck. LUNGS: clear to auscultation , and palpation HEART: regular rate & rhythm, no murmurs, and no gallops ABDOMEN:abdomen soft, non-tender, normal bowel sounds, and no masses or organomegaly BACK: No CVA tenderness, Range of motion is normal EXTREMITIES:no joint deformities, effusion, or inflammation, no edema  NEURO: alert & oriented x 3 with fluent speech, no focal motor/sensory deficits  PERFORMANCE STATUS: ECOG 1  LABORATORY DATA: Lab Results  Component Value Date   WBC 8.4 11/20/2020   HGB 13.0 11/20/2020   HCT 38.7 (L) 11/20/2020   MCV 88.6 11/20/2020   PLT 268 11/20/2020      Chemistry      Component Value Date/Time   NA 133 (L) 11/20/2020 1350   K 3.7 11/20/2020 1350   CL 98 11/20/2020 1350   CO2 27 11/20/2020 1350   BUN 12 11/20/2020 1350   CREATININE 0.63 11/20/2020 1350      Component Value Date/Time   CALCIUM 8.8 (L) 11/20/2020 1350   ALKPHOS 89 01/25/2008 2040   AST 108 (H) 01/25/2008 2040   ALT 134 (H) 01/25/2008 2040  BILITOT 1.1 01/25/2008 2040       RADIOGRAPHIC STUDIES: CT Soft Tissue Neck W Contrast  Result Date: 11/20/2020 CLINICAL DATA:  Neck mass, initial workup EXAM: CT NECK WITH CONTRAST TECHNIQUE: Multidetector CT imaging of the neck was performed using the standard protocol following the bolus administration of intravenous contrast. CONTRAST:  71mL OMNIPAQUE IOHEXOL 300 MG/ML  SOLN COMPARISON:  None. FINDINGS: Pharynx and larynx: Nasopharynx is unremarkable. There is asymmetric soft tissue thickening the right lateral wall of the oropharynx. There is involvement of the walls of the oropharynx. Thickening of the aryepiglottic folds. Possible involvement of the epiglottis though lingual surface is likely spared. Piriform sinuses are effaced. Asymmetric supraglottic thickening on the right to the level of laryngeal vestibule. Suspect extension to level of right vocal cords. Significant airway narrowing at level of lower oropharynx and  hypopharynx. Involvement of thyroid and laryngeal cartilages is difficult to exclude due to motion. Probable small volume retropharyngeal fluid. Salivary glands: Parotid and submandibular glands Thyroid: Normal. Lymph nodes: Right upper cervical adenopathy. Conglomerate right level 2 node measures 2.4 cm. There is also probable right retropharyngeal adenopathy with limited measurement (approximately 1.4 cm) on series 3, image 45. Vascular: Effacement of right internal jugular vein along adenopathy. Mild calcified plaque at carotid bifurcations. Limited intracranial: No abnormal enhancement. Visualized orbits: Unremarkable. Mastoids and visualized paranasal sinuses: No significant opacification. Skeleton: No significant osseous abnormality. Upper chest: Mild emphysema. IMPRESSION: Oropharynx, hypopharynx, and supraglottic larynx malignancy. Suspect extension to level of vocal cords on right. Significant airway narrowing at level of lower oropharynx and hypopharynx. Metastatic adenopathy on the right including conglomerate right level 2 node. Electronically Signed   By: Macy Mis M.D.   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   PLAN: I had a lengthy discussion with the patient and his mother today about his current condition and further investigation to confirm his diagnosis as well as the staging work-up. I recommended for the patient to keep his appointment for the ultrasound-guided core biopsy of the large right neck mass for confirmation of his tissue diagnosis. I also recommend for the patient to have the PET scan performed as a scheduled next week. Once the tissue diagnosis is confirmed, the patient  may benefit from laryngeal reservation treatment with a course of concurrent chemoradiation.  His radiation therapy will be under the care of Dr. Isidore Moos and I will consider The patient for treatment with cisplatin 100 Mg/M2 every 3 weeks during this course of radiotherapy. I also discussed with the patient briefly the option of surgical resection but he is interested and the laryngeal preservation to avoid loss of his voice. The patient understand that this treatment will have a lot of adverse effect including but not limited to alopecia, myelosuppression, nausea and vomiting, peripheral neuropathy, liver or renal dysfunction as well as hearing deficit in addition to the significant adverse effects from radiation which is discussed with him by Dr. Isidore Moos. He may benefit from a PEG tube placement during his treatment. I will arrange for the patient to have a chemotherapy education class before the first dose of his treatment.  I will not schedule the treatment until he is ready to start radiotherapy. I will  arrange for the patient to come back for follow-up visit in around 2 weeks for evaluation. For the history of drug abuse, the patient is currently on methadone and I strongly encouraged him to avoid any drug abuse this point.  He will continue his pain management by the pain clinic. For the social issues, he will need referral to the social worker at the cancer center for evaluation of his condition and help with his situation with his mother at home. He was advised to call immediately if he has any other concerning symptoms in the interval.  The patient voices understanding of current disease status and treatment options and is in agreement with the current care plan.  All questions were answered. The patient knows to call the clinic with any problems, questions or concerns. We can certainly see the patient much sooner if necessary.  Thank you so much for allowing me to participate in the care of  David [name]. I will continue to follow up the patient with you and assist in his care.  The total time spent in the appointment was 60 minutes.  Disclaimer: This note was dictated with voice recognition software. Similar sounding words can inadvertently be transcribed and may not be corrected upon review.   Eilleen Kempf December 05, 2020, 2:11 PM

## 2020-12-05 NOTE — Patient Instructions (Signed)
Steps to Quit Smoking Smoking tobacco is the leading cause of preventable death. It can affect almost every organ in the body. Smoking puts you and people around you at risk for many serious, long-lasting (chronic) diseases. Quitting smoking can be hard, but it is one of the best things that you can do for your health. It is never too late to quit. How do I get ready to quit? When you decide to quit smoking, make a plan to help you succeed. Before you quit: Pick a date to quit. Set a date within the next 2 weeks to give you time to prepare. Write down the reasons why you are quitting. Keep this list in places where you will see it often. Tell your family, friends, and co-workers that you are quitting. Their support is important. Talk with your doctor about the choices that may help you quit. Find out if your health insurance will pay for these treatments. Know the people, places, things, and activities that make you want to smoke (triggers). Avoid them. What first steps can I take to quit smoking? Throw away all cigarettes at home, at work, and in your car. Throw away the things that you use when you smoke, such as ashtrays and lighters. Clean your car. Make sure to empty the ashtray. Clean your home, including curtains and carpets. What can I do to help me quit smoking? Talk with your doctor about taking medicines and seeing a counselor at the same time. You are more likely to succeed when you do both. If you are pregnant or breastfeeding, talk with your doctor about counseling or other ways to quit smoking. Do not take medicine to help you quit smoking unless your doctor tells you to do so. To quit smoking: Quit right away Quit smoking totally, instead of slowly cutting back on how much you smoke over a period of time. Go to counseling. You are more likely to quit if you go to counseling sessions regularly. Take medicine You may take medicines to help you quit. Some medicines need a  prescription, and some you can buy over-the-counter. Some medicines may contain a drug called nicotine to replace the nicotine in cigarettes. Medicines may: Help you to stop having the desire to smoke (cravings). Help to stop the problems that come when you stop smoking (withdrawal symptoms). Your doctor may ask you to use: Nicotine patches, gum, or lozenges. Nicotine inhalers or sprays. Non-nicotine medicine that is taken by mouth. Find resources Find resources and other ways to help you quit smoking and remain smoke-free after you quit. These resources are most helpful when you use them often. They include: Online chats with a counselor. Phone quitlines. Printed self-help materials. Support groups or group counseling. Text messaging programs. Mobile phone apps. Use apps on your mobile phone or tablet that can help you stick to your quit plan. There are many free apps for mobile phones and tablets as well as websites. Examples include Quit Guide from the CDC and smokefree.gov  What things can I do to make it easier to quit?  Talk to your family and friends. Ask them to support and encourage you. Call a phone quitline (1-800-QUIT-NOW), reach out to support groups, or work with a counselor. Ask people who smoke to not smoke around you. Avoid places that make you want to smoke, such as: Bars. Parties. Smoke-break areas at work. Spend time with people who do not smoke. Lower the stress in your life. Stress can make you want to   smoke. Try these things to help your stress: Getting regular exercise. Doing deep-breathing exercises. Doing yoga. Meditating. Doing a body scan. To do this, close your eyes, focus on one area of your body at a time from head to toe. Notice which parts of your body are tense. Try to relax the muscles in those areas. How will I feel when I quit smoking? Day 1 to 3 weeks Within the first 24 hours, you may start to have some problems that come from quitting tobacco.  These problems are very bad 2-3 days after you quit, but they do not often last for more than 2-3 weeks. You may get these symptoms: Mood swings. Feeling restless, nervous, angry, or annoyed. Trouble concentrating. Dizziness. Strong desire for high-sugar foods and nicotine. Weight gain. Trouble pooping (constipation). Feeling like you may vomit (nausea). Coughing or a sore throat. Changes in how the medicines that you take for other issues work in your body. Depression. Trouble sleeping (insomnia). Week 3 and afterward After the first 2-3 weeks of quitting, you may start to notice more positive results, such as: Better sense of smell and taste. Less coughing and sore throat. Slower heart rate. Lower blood pressure. Clearer skin. Better breathing. Fewer sick days. Quitting smoking can be hard. Do not give up if you fail the first time. Some people need to try a few times before they succeed. Do your best to stick to your quit plan, and talk with your doctor if you have any questions or concerns. Summary Smoking tobacco is the leading cause of preventable death. Quitting smoking can be hard, but it is one of the best things that you can do for your health. When you decide to quit smoking, make a plan to help you succeed. Quit smoking right away, not slowly over a period of time. When you start quitting, seek help from your doctor, family, or friends. This information is not intended to replace advice given to you by your health care provider. Make sure you discuss any questions you have with your health care provider. Document Revised: 09/22/2020 Document Reviewed: 04/04/2018 Elsevier Patient Education  2022 [employer]  

## 2020-12-06 ENCOUNTER — Other Ambulatory Visit: Payer: Self-pay

## 2020-12-06 ENCOUNTER — Telehealth: Payer: Self-pay | Admitting: Internal Medicine

## 2020-12-06 DIAGNOSIS — C328 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: Secondary | ICD-10-CM

## 2020-12-06 LAB — HEPATITIS PANEL, ACUTE
HCV Ab: REACTIVE — AB
Hep A IgM: NONREACTIVE
Hep B C IgM: NONREACTIVE
Hepatitis B Surface Ag: NONREACTIVE

## 2020-12-06 LAB — T4, FREE: Free T4: 2.14 ng/dL — ABNORMAL HIGH (ref 0.61–1.12)

## 2020-12-06 NOTE — Telephone Encounter (Signed)
Left message with follow-up appointment per 1/18 los. °

## 2020-12-07 ENCOUNTER — Other Ambulatory Visit: Payer: Self-pay | Admitting: Radiology

## 2020-12-08 ENCOUNTER — Ambulatory Visit (HOSPITAL_COMMUNITY)
Admission: RE | Admit: 2020-12-08 | Discharge: 2020-12-08 | Disposition: A | Discharge status: Home / Self Care | Payer: Medicaid Other | Source: Ambulatory Visit | Attending: Otolaryngology | Admitting: Otolaryngology

## 2020-12-08 ENCOUNTER — Other Ambulatory Visit: Payer: Self-pay

## 2020-12-08 DIAGNOSIS — C969 Malignant neoplasm of lymphoid, hematopoietic and related tissue, unspecified: Secondary | ICD-10-CM | POA: Diagnosis not present

## 2020-12-08 DIAGNOSIS — R221 Localized swelling, mass and lump, neck: Secondary | ICD-10-CM | POA: Diagnosis not present

## 2020-12-08 DIAGNOSIS — J387 Other diseases of larynx: Secondary | ICD-10-CM | POA: Insufficient documentation

## 2020-12-08 LAB — CBC
HCT: 40.1 % (ref 39.0–52.0)
Hemoglobin: 13 g/dL (ref 13.0–17.0)
MCH: 28.4 pg (ref 26.0–34.0)
MCHC: 32.4 g/dL (ref 30.0–36.0)
MCV: 87.6 fL (ref 80.0–100.0)
Platelets: 350 10*3/uL (ref 150–400)
RBC: 4.58 MIL/uL (ref 4.22–5.81)
RDW: 13.7 % (ref 11.5–15.5)
WBC: 11.1 10*3/uL — ABNORMAL HIGH (ref 4.0–10.5)
nRBC: 0 % (ref 0.0–0.2)

## 2020-12-08 LAB — PROTIME-INR
INR: 1.1 (ref 0.8–1.2)
Prothrombin Time: 14.1 seconds (ref 11.4–15.2)

## 2020-12-08 MED ORDER — SODIUM CHLORIDE 0.9 % IV SOLN: INTRAVENOUS | Status: DC

## 2020-12-08 MED ORDER — LIDOCAINE HCL (PF) 1 % IJ SOLN
INTRAMUSCULAR | Status: AC
  Filled 2020-12-08: qty 30

## 2020-12-08 NOTE — Procedures (Signed)
Interventional Radiology Procedure Note  Procedure: US guided biopsy of right neck mass Complications: None EBL: None Recommendations: - routine wound care - Follow up pathology - dc now  Signed,  Corrie Mckusick, DO

## 2020-12-11 ENCOUNTER — Other Ambulatory Visit (HOSPITAL_COMMUNITY): Payer: Medicaid Other | Admitting: Dentistry

## 2020-12-12 ENCOUNTER — Encounter (HOSPITAL_COMMUNITY)
Admission: RE | Admit: 2020-12-12 | Discharge: 2020-12-12 | Disposition: A | Discharge status: Home / Self Care | Payer: Medicaid Other | Source: Ambulatory Visit | Attending: Radiation Oncology | Admitting: Radiation Oncology

## 2020-12-12 ENCOUNTER — Other Ambulatory Visit: Payer: Self-pay

## 2020-12-12 DIAGNOSIS — R591 Generalized enlarged lymph nodes: Secondary | ICD-10-CM | POA: Diagnosis not present

## 2020-12-12 LAB — SURGICAL PATHOLOGY

## 2020-12-12 LAB — GLUCOSE, CAPILLARY: Glucose-Capillary: 59 mg/dL — ABNORMAL LOW (ref 70–99)

## 2020-12-12 MED ORDER — FLUDEOXYGLUCOSE F - 18 (FDG) INJECTION
6.5000 | Freq: Once | INTRAVENOUS | Status: AC | PRN
  Administered 2020-12-12: 6.44 via INTRAVENOUS

## 2020-12-12 NOTE — Progress Notes (Signed)
[facility] OFFICE PROGRESS NOTE  Default, Provider, MD No address on file  DIAGNOSIS: Stage IV (T3, N3, M0) squamous cell carcinoma.  Patient presented with right oropharyngeal, hypopharyngeal, and supraglottic mass that extends down to the level of the glottis.  He also has bilateral paracentral adenopathy and left supraclavicular and right upper thoracic paraesophageal adenopathy.  He was diagnosed in November 2022.  PRIOR THERAPY: None   CURRENT THERAPY: Concurrent chemoradiation with cisplatin 100 mg/m2  every 3 weeks with concurrent radiation under the care of Dr. Isidore Moos.  First dose expected on 12/25/20   INTERVAL HISTORY: David [name] 45 y.o. male returns to the clinic today for a follow-up visit accompanied by his mother.  The patient was recently found to have suspicious head and neck cancer after he presented with a neck mass.  The patient was last seen by Dr. Julien Nordmann on 12/05/2020.  At that point time, the patient had not had a biopsy performed.  In the interval since his last appointment he was seen by interventional radiology who performed a ultrasound-guided core biopsy of the neck mass on 12/08/2020.  The final pathology was consistent with keratinizing squamous cell carcinoma.  He also underwent a staging PET scan on 12/12/2020.  The patient met with Dr. Isidore Moos in the interval for a simulation on 12/13/2020. He started radiation treatment yesterday. He is expected to continue radiation until 02/08/21.   Due to the patient's history of drug abuse, the patient is currently followed by the the methadone clinic.  He also has significant psychosocial and financial concerns for which she is followed by Education officer, museum who has referred him to several resources. He has homelessness and slept outside in the woods last night. There is some tension with his mother, who accompanied him to his appointment today and is taking care of his schedule.     He denies any recent fever,  chills, or night sweats. He has an appetite to eat and is able to swallow food, but he sometimes has pain. The viscous lidocaine helps and he also takes NSAIDs. He is wondering if he can get pain medication. He had an episode a few days ago where he had a "web" of phlegm in his throat that got him choked up and he had shortness of breath and called EMS. Denies any chest pain or hemoptysis.  The patient continues to have hoarseness.  He denies any headache or visual changes.  He is taking decadron BID. He denies any skin infections, dysuria, abdominal pain, new sore throat, sick contacts, or nasal congestion. He is here today to review his scan results and for more detailed discussion about his current condition and recommended treatment options.  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  MEDICATIONS:  Current Outpatient Medications  Medication Sig Dispense Refill   prochlorperazine (COMPAZINE) 10 MG tablet Take 1 tablet (10 mg total) by mouth every 6 (six) hours as needed. 30 tablet 2   amoxicillin-clavulanate (AUGMENTIN) 875-125 MG tablet Take 1 tablet by mouth 2 (two) times daily. One po bid x 7 days (Patient not taking: No sig reported) 14 tablet 0   dexamethasone (DECADRON) 4 MG tablet Take 1 tablet PO BID; take with food. 40 tablet 0   fluticasone (FLONASE) 50 MCG/ACT nasal spray Place 2 sprays into both nostrils  daily. (Patient not taking: Reported on 12/05/2020) 18 mL 2   ibuprofen (ADVIL,MOTRIN) 200 MG tablet Take 600 mg by mouth every 6 (six) hours as needed for moderate pain.     lidocaine (XYLOCAINE) 2 % solution Patient: Mix 1part 2% viscous lidocaine, 1part H20. Swish & swallow 59m of diluted mixture, 371m before meals and at bedtime, up to QID prn soreness. 200 mL 3   methadone (DOLOPHINE) 10 MG/ML solution Take 50 mg by mouth daily.      omeprazole (PRILOSEC) 20 MG capsule Take 1 capsule (20 mg total) by mouth daily. 30 capsule 0   No current facility-administered medications for this visit.    SURGICAL HISTORY:  Past Surgical History:  Procedure Laterality Date   chest tubes Left     REVIEW OF SYSTEMS:   Review of Systems  Constitutional: Positive for appetite change, fatigue, and weight loss. Negative for chills and fever. HENT: Positive for visible right sided neck mass. Positive for odynophagia. Negative for mouth sores, nosebleeds, sore throat. Eyes: Negative for eye problems and icterus.  Respiratory: Negative for cough, hemoptysis, shortness of breath and wheezing.   Cardiovascular: Negative for chest pain and leg swelling.  Gastrointestinal: Negative for abdominal pain, constipation, diarrhea, nausea and vomiting.  Genitourinary: Negative for bladder incontinence, difficulty urinating, dysuria, frequency and hematuria.   Musculoskeletal: Negative for back pain, gait problem, neck pain and neck stiffness.  Skin: Negative for itching and rash.  Neurological: Negative for dizziness, extremity weakness, gait problem, headaches, light-headedness and seizures.  Hematological: Positive for cervical adenopathy. Does not bruise/bleed easily.  Psychiatric/Behavioral: Negative for confusion, depression and sleep disturbance. The patient is not nervous/anxious.     PHYSICAL EXAMINATION:  Blood pressure 120/84, pulse 76, temperature (!) 97.3 F (36.3 C), resp. rate 20, weight 123 lb 11.2 oz (56.1 kg), SpO2 98 %.  ECOG PERFORMANCE STATUS: 1  Physical Exam  Constitutional: Oriented to person, place, and time and well-developed, well-nourished, and in no distress.   HENT:  Head: Normocephalic and atraumatic.  Mouth/Throat: Positive for right sided neck mass.  Eyes: Conjunctivae are normal. Right eye exhibits no discharge. Left eye exhibits no discharge. No scleral icterus.  Neck: Normal range of motion. Neck supple.   Cardiovascular: Normal rate, regular rhythm, normal heart sounds and intact distal pulses.   Pulmonary/Chest: Effort normal and breath sounds normal. No respiratory distress. No wheezes. No rales.  Abdominal: Soft. Bowel sounds are normal. Exhibits no distension and no mass. There is no tenderness.  Musculoskeletal: Normal range of motion. Exhibits no edema.  Lymphadenopathy:    Positive for cervical adenopathy.   Neurological: Alert and oriented to person, place, and time. Exhibits normal muscle tone. Gait normal. Coordination normal.  Skin: Skin is warm and dry. No rash noted. Not diaphoretic. No erythema. No pallor.  Psychiatric: Mood, memory and judgment normal.  Vitals reviewed.  LABORATORY DATA: Lab Results  Component Value Date   WBC 34.3 (H) 12/19/2020   HGB 14.6 12/19/2020   HCT 43.9 12/19/2020   MCV 86.1 12/19/2020   PLT 373 12/19/2020      Chemistry      Component Value Date/Time   NA 135 12/19/2020 0832   K 4.0 12/19/2020 0832   CL 99 12/19/2020 0832   CO2 26 12/19/2020 0832   BUN 28 (H) 12/19/2020 0832   CREATININE 0.82 12/19/2020 0832      Component Value Date/Time   CALCIUM 8.9 12/19/2020 0832   ALKPHOS 96 12/19/2020 0832   AST  21 12/19/2020 0832   ALT 31 12/19/2020 0832   BILITOT 0.4 12/19/2020 0832       RADIOGRAPHIC STUDIES:  CT Soft Tissue Neck W Contrast  Result Date: 11/20/2020 CLINICAL DATA:  Neck mass, initial workup EXAM: CT NECK WITH CONTRAST TECHNIQUE: Multidetector CT imaging of the neck was performed using the standard protocol following the bolus administration of intravenous contrast. CONTRAST:  60m OMNIPAQUE IOHEXOL 300 MG/ML  SOLN COMPARISON:  None. FINDINGS: Pharynx and larynx: Nasopharynx is unremarkable. There is asymmetric soft tissue thickening the right lateral wall of the oropharynx. There is involvement of the walls of the oropharynx. Thickening of the aryepiglottic folds. Possible involvement of the epiglottis though lingual  surface is likely spared. Piriform sinuses are effaced. Asymmetric supraglottic thickening on the right to the level of laryngeal vestibule. Suspect extension to level of right vocal cords. Significant airway narrowing at level of lower oropharynx and hypopharynx. Involvement of thyroid and laryngeal cartilages is difficult to exclude due to motion. Probable small volume retropharyngeal fluid. Salivary glands: Parotid and submandibular glands Thyroid: Normal. Lymph nodes: Right upper cervical adenopathy. Conglomerate right level 2 node measures 2.4 cm. There is also probable right retropharyngeal adenopathy with limited measurement (approximately 1.4 cm) on series 3, image 45. Vascular: Effacement of right internal jugular vein along adenopathy. Mild calcified plaque at carotid bifurcations. Limited intracranial: No abnormal enhancement. Visualized orbits: Unremarkable. Mastoids and visualized paranasal sinuses: No significant opacification. Skeleton: No significant osseous abnormality. Upper chest: Mild emphysema. IMPRESSION: Oropharynx, hypopharynx, and supraglottic larynx malignancy. Suspect extension to level of vocal cords on right. Significant airway narrowing at level of lower oropharynx and hypopharynx. Metastatic adenopathy on the right including conglomerate right level 2 node. Electronically Signed   By: PMacy MisM.D.   On: 11/20/2020 15:51   NM PET Image Initial (PI) Skull Base To Thigh  Result Date: 12/13/2020 CLINICAL DATA:  Initial treatment strategy for right pharyngeal squamous cell carcinoma. EXAM: NUCLEAR MEDICINE PET SKULL BASE TO THIGH TECHNIQUE: 6.4 mCi F-18 FDG was injected intravenously. Full-ring PET imaging was performed from the skull base to thigh after the radiotracer. CT data was obtained and used for attenuation correction and anatomic localization. Fasting blood glucose: 59 mg/dl COMPARISON:  CT neck 11/20/2020 FINDINGS: Mediastinal blood pool activity: SUV max 1.4 Liver  activity: SUV max N/A NECK: Right eccentric oropharyngeal and hypopharyngeal mass extends caudad in the right paralaryngeal space along the right aryepiglottic fold down to the level of the cords, extending over a 6.3 cm vertical excursion with maximum SUV 24.0. There is bilateral hypermetabolic level IIa, right level III, and right superficial anterior cervical adenopathy (VI). The dominant right level II lymph node measures proximally 3.2 cm in short axis with maximum SUV 18.4. The index right level VI lymph node measures 1.1 cm in short axis on image 54 series 4 with maximum SUV 9.3. Incidental CT findings: Mild bilateral common carotid atherosclerotic calcification. CHEST: Hypermetabolic left supraclavicular and upper thoracic paraesophageal adenopathy. Index supraclavicular node 0.8 cm in short axis on image 62 series 4, maximum SUV 7.4. Index upper left paraesophageal lymph node 0.8 cm in short axis on image 56 series 4, maximum SUV 4.5. Faint patchy ground-glass opacities in the right upper lobe, left lower lobe, and left upper lobe are identified. The left upper lobe ground-glass opacity shown on image 35 series 8 has maximum SUV of 1.5. These are likely inflammatory given the distribution although surveillance chest CT is likely warranted to exclude synchronous low-grade adenocarcinoma.  Incidental CT findings: Atherosclerotic calcification of the thoracic aorta and branch vessels. ABDOMEN/PELVIS: No significant abnormal hypermetabolic activity in this region. Incidental CT findings: Atherosclerosis is present, including aortoiliac atherosclerotic disease. Prominent stool throughout the colon favors constipation. SKELETON: No significant abnormal hypermetabolic activity in this region. Incidental CT findings: none IMPRESSION: 1. Right oropharyngeal, hypopharyngeal, and supraglottic mass extends down to the level of the glottis, with a 6.3 cm vertical excursion in maximum SUV of 24.0. 2. Associated bilateral  hypermetabolic level II, right level III, and right paracentral VI adenopathy along with left supraclavicular and a right upper thoracic paraesophageal hypermetabolic lymph node. 3. Faint patchy ground-glass opacities in both lungs are identified with low-grade activity. These are likely inflammatory given the distribution although surveillance chest CT is likely warranted in 3-6 months to exclude synchronous low-grade adenocarcinoma. 4. Aortic Atherosclerosis (ICD10-I70.0). Aortic branch vessel atherosclerosis. 5.  Prominent stool throughout the colon favors constipation. Electronically Signed   By: Van Clines M.D.   On: 12/13/2020 07:15   [name] CORE BIOPSY (SOFT TISSUE)  Result Date: 12/08/2020 INDICATION: 45 year old male with a right neck mass, CT imaging demonstrating head neck carcinoma EXAM: ULTRASOUND-GUIDED RIGHT NECK MASS BIOPSY MEDICATIONS: None. ANESTHESIA/SEDATION: None FLUOROSCOPY TIME:  None COMPLICATIONS: None PROCEDURE: Informed written consent was obtained from the patient after a thorough discussion of the procedural risks, benefits and alternatives. All questions were addressed. Maximal Sterile Barrier Technique was utilized including caps, mask, sterile gowns, sterile gloves, sterile drape, hand hygiene and skin antiseptic. A timeout was performed prior to the initiation of the procedure. Ultrasound survey was performed with images stored and sent to PACs. The right neck was prepped with chlorhexidine in a sterile fashion, and a sterile drape was applied covering the operative field. A sterile gown and sterile gloves were used for the procedure. Local anesthesia was provided with 1% Lidocaine. Ultrasound guidance was used to infiltrate the region with 1% lidocaine for local anesthesia. Small stab incision was made with 11 blade scalpel. Four separate 16 gauge core biopsy were then acquired of the right neck nodal mass using ultrasound guidance. Images were stored. Final image was  stored after biopsy. Patient tolerated the procedure well and remained hemodynamically stable throughout. No complications were encountered and no significant blood loss was encounter IMPRESSION: Status post ultrasound-guided right neck mass biopsy. Signed, Dulcy Fanny. Dellia Nims, RPVI [facility] Radiology Electronically Signed   By: Corrie Mckusick D.O.   On: 12/08/2020 15:13     ASSESSMENT/PLAN:  This is a very pleasant 45 year old Caucasian male with head and neck cancer, squamous cell carcinoma stage IV (T3, N3, M0).  He presented with right oropharyngeal, hypopharyngeal, and supraglottic mass that extends down to the level of the glottis.  He also has bilateral paracentral adenopathy and left supraclavicular and right upper thoracic paraesophageal adenopathy.  He was diagnosed in November 2022.   The patient was seen with Dr. Julien Nordmann today.  Dr. Julien Nordmann had a lengthy discussion with the patient today about his current condition and recommended treatment options.  Dr. Julien Nordmann recommended the patient undergo concurrent chemoradiation with cisplatin 100 mg/m2 every 3 weeks.  The patient is interested in this option as he is expected to start his first cycle of treatment on 12/25/2020.  The adverse side effects of treatment were discussed including but not limited to alopecia, myelosuppression, nausea and vomiting, peripheral neuropathy, liver or renal dysfunction as well as hearing deficit in addition to the significant adverse effects from radiation which  is discussed with him by Dr. Isidore Moos.  I have sent a prescription for Compazine 10 mg p.o. every 6 hours as needed to the patient's pharmacy.   The patient will continue to follow with social work due to his significant psychosocial and financial constraints. I will reach out to them to see if they need any more clinical information for his applications.   I will arrange for chemo education class prior to  starting his first cycle of treatment. This is scheduled for 12/20/20  We will see him back for follow-up visit in 2 weeks for evaluation before starting cycle #2.  We will not arrange for a PEG tube at this time since he is able to swallow. However, we will continue to monitor his weights closely. If he has trouble swallowing or significant weight loss we will consider a PEG tube. I encouraged him to drink boost/ensure and have given him some coupons today.   He will continue to follow with the methadone clinic for now. We discussed non-narcotic options for his swallowing. He will continue with viscous lidocaine. Reviewed not sure use NSAIDs with his chemotherapy due to concern for nephrotoxicity. Encouraged him to drink plenty of water. Advised to use tylenol if needed for pain.   Dr. Julien Nordmann recommends referring the patient to palliative care due to his complex needs regarding his cancer.   He had questions about HIV testing due to his history of IVDU. Directed him see his PCP or health department for testing.   He has leukocytosis on labs today. He denies any signs and symptoms of infection including dysuria, fevers, chills, abdominal pain, skin infections, URIs, sick contacts. Will monitor for now. Discussed he should call us if he has any signs or symptoms of infection.   The patient was advised to call immediately if she has any concerning symptoms in the interval. The patient voices understanding of current disease status and treatment options and is in agreement with the current care plan. All questions were answered. The patient knows to call the clinic with any problems, questions or concerns. We can certainly see the patient much sooner if necessary   No orders of the defined types were placed in this encounter.  Larayne Baxley L Vestal Crandall, PA-C 12/19/20  ADDENDUM: Hematology/Oncology Attending: I had a face-to-face encounter with the patient today.  I reviewed his record, lab,  scan and recommended his care plan.  This is a 45 years old white male recently diagnosed with a stage IVa (T3, N3, M0) squamous cell carcinoma of the larynx, hypopharynx with bilateral neck lymphadenopathy.  The patient had a PET scan performed recently.  I personally and independently reviewed the scan images and discussed the result and showed the images to the patient today. I recommended for the patient a course of concurrent chemoradiation with cisplatin 100 Mg/M2 every 3 weeks for 3 cycles during the course of the radiotherapy. He is going to receive his radiotherapy under the care of Dr. Isidore Moos. For the chronic pain management he is currently on methadone.  We will also refer the patient to the palliative care clinic for evaluation and help with his symptoms during the treatment. He may need a PEG tube placement at some point for nutrition but he is currently able to swallow with no significant limitation. The patient is expected to start the first cycle of his treatment next week. He will come back for follow-up visit in 2 weeks for evaluation and management of any adverse effect of his treatment.  The patient has a lot of social issues and dispute with his mother all the time. We are getting the social worker involved with in his care.  He will have a chemotherapy education class before the first dose of his treatment. He was advised to call immediately if he has any other concerning symptoms in the interval. The total time spent in the appointment was 32 minutes. Disclaimer: This note was dictated with voice recognition software. Similar sounding words can inadvertently be transcribed and may be missed upon review. Eilleen Kempf, MD 12/19/20

## 2020-12-13 ENCOUNTER — Ambulatory Visit
Admission: RE | Admit: 2020-12-13 | Discharge: 2020-12-13 | Disposition: A | Discharge status: Home / Self Care | Payer: Medicaid Other | Source: Ambulatory Visit | Attending: Radiation Oncology | Admitting: Radiation Oncology

## 2020-12-13 ENCOUNTER — Other Ambulatory Visit: Payer: Self-pay

## 2020-12-13 ENCOUNTER — Ambulatory Visit (INDEPENDENT_AMBULATORY_CARE_PROVIDER_SITE_OTHER): Payer: Medicaid Other | Admitting: Dentistry

## 2020-12-13 ENCOUNTER — Encounter: Payer: Self-pay | Admitting: General Practice

## 2020-12-13 ENCOUNTER — Other Ambulatory Visit: Payer: Self-pay | Admitting: Internal Medicine

## 2020-12-13 VITALS — BP 111/66 | HR 68 | Temp 96.3°F | Resp 18 | Ht 70.0 in | Wt 124.4 lb

## 2020-12-13 VITALS — BP 122/68 | HR 67 | Temp 97.7°F

## 2020-12-13 DIAGNOSIS — Z7189 Other specified counseling: Secondary | ICD-10-CM | POA: Insufficient documentation

## 2020-12-13 DIAGNOSIS — C328 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: Secondary | ICD-10-CM

## 2020-12-13 DIAGNOSIS — K030 Excessive attrition of teeth: Secondary | ICD-10-CM

## 2020-12-13 DIAGNOSIS — Z510 Encounter for antineoplastic radiation therapy: Secondary | ICD-10-CM | POA: Diagnosis not present

## 2020-12-13 DIAGNOSIS — Z463 Encounter for fitting and adjustment of dental prosthetic device: Secondary | ICD-10-CM

## 2020-12-13 DIAGNOSIS — K036 Deposits [accretions] on teeth: Secondary | ICD-10-CM

## 2020-12-13 DIAGNOSIS — K029 Dental caries, unspecified: Secondary | ICD-10-CM

## 2020-12-13 DIAGNOSIS — Z0120 Encounter for dental examination and cleaning without abnormal findings: Secondary | ICD-10-CM | POA: Insufficient documentation

## 2020-12-13 MED ORDER — SODIUM CHLORIDE 0.9% FLUSH
10.0000 mL | Freq: Once | INTRAVENOUS | Status: AC
  Administered 2020-12-13: 10 mL via INTRAVENOUS

## 2020-12-13 NOTE — Progress Notes (Signed)
[place] [facility] Spiritual Care Note  Referred by Willodean Rosenthal for emotional support related to new diagnosis, social situation, and family conflict. Met with "David [name]" briefly in radiation oncology to introduce Spiritual Care as part of his support team. He shared readily about his history, present needs, and emotional distress related to family conflict. We plan to follow up in more detail in a future encounter. Plan to schedule by phone because today he needed to progress to CT simulation.   [place], [place], [facility] Pager [phone] Voicemail [phone]

## 2020-12-13 NOTE — Progress Notes (Signed)
Has armband been applied?  Yes.    Does patient have an allergy to IV contrast dye?: No.   Has patient ever received premedication for IV contrast dye?: No.   Does patient take metformin?: No.  Date of lab work: December 05, 2020 BUN: 21 CR: 0.68 eGFR: >60  IV site: forearm left, condition patent and no redness  Has IV site been added to flowsheet?  Yes.    BP 111/66 (BP Location: Left Arm, Patient Position: Sitting)   Pulse 68   Temp (!) 96.3 F (35.7 C) (Temporal)   Resp 18   Ht _0  (1.778 m)   Wt 124 lb 6 oz (56.4 kg)   SpO2 99%   BMI 17.85 kg/m

## 2020-12-13 NOTE — Progress Notes (Signed)
START ON PATHWAY REGIMEN - Head and Neck     A cycle is every 21 days:     Cisplatin   **Always confirm dose/schedule in your pharmacy ordering system**  Patient Characteristics: Larynx, Preoperative or Nonsurgical Candidate, Stage III - IVB Disease Classification: Larynx AJCC T Category: T3 AJCC 8 Stage Grouping: IVA Therapeutic Status: Preoperative or Nonsurgical Candidate AJCC N Category: cN2 AJCC M Category: M0 Intent of Therapy: Curative Intent, Discussed with Patient

## 2020-12-13 NOTE — Progress Notes (Signed)
Oncology Nurse Navigator Documentation   To provide support, encouragement and care continuity, met with David [name] during his CT SIM. He was accompanied by his mother. There are multiple stressors at home regarding his current drug use and living under his mother's roof. Support given to patient. Lattie Haw with Spiritual Care was able to come speak to him as well.  He tolerated procedure without difficulty, denied questions/concerns.    He voiced understanding.   I encouraged him or his mother to call me prior to 12/18/20 [place].   Harlow Asa RN, BSN, OCN Head & Neck Oncology Nurse [place] [facility] Phone # [phone]  Fax # [phone]

## 2020-12-13 NOTE — Progress Notes (Signed)
         Department of Dental Medicine    INTRAORAL DEVICE: DELIVERY   Service Date:   12/13/2020  Patient Name:   David [name] Date of Birth:   11/20/1975 Medical Record Number: [id number]   TODAY'S VISIT   Procedures: Delivered upper and lower scatter protection devices  Plan: Follow-up s/p radiation therapy   12/13/2020 Progress Note:  COVID-19 SCREENING:  The patient denies symptoms concerning for COVID-19 infection including fever, chills, cough, or newly developed shortness of breath.   HISTORY OF PRESENT ILLNESS: David [name] presents today for delivery of upper and lower scatter protection devices. Medical and dental history reviewed with the patient.  Simulation is scheduled right after his dental appointment today.   CHIEF COMPLAINT:   Here for a routine dental appointment.   Patient Active Problem List   Diagnosis Date Noted  . 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 ([place]) 12/04/2020  . Encounter for preoperative dental examination 12/04/2020  . Teeth missing 12/04/2020  . Caries 12/04/2020  . Retained tooth root 12/04/2020  . Chronic apical periodontitis 12/04/2020  . Impacted third molar tooth 12/04/2020  . Attrition, teeth excessive 12/04/2020  . Chronic periodontitis 12/04/2020  . Accretions on teeth 12/04/2020  . Incipient enamel caries 12/04/2020   Past Medical History:  Diagnosis Date  . 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[place])   . Pneumothorax    Left lung spontaneous pneumothorax at age 31 yr    Current Outpatient Medications  Medication Sig Dispense Refill  . amoxicillin-clavulanate (AUGMENTIN) 875-125 MG tablet Take 1 tablet by mouth 2 (two) times daily. One po bid x 7 days (Patient not taking: No sig reported) 14 tablet 0  . dexamethasone (DECADRON) 4 MG tablet Take 1 tablet PO BID; take with food. 40 tablet 0  . fluticasone (FLONASE) 50 MCG/ACT nasal spray Place 2 sprays into both nostrils daily. (Patient not taking: Reported on  12/05/2020) 18 mL 2  . ibuprofen (ADVIL,MOTRIN) 200 MG tablet Take 600 mg by mouth every 6 (six) hours as needed for moderate pain.    Marland Kitchen lidocaine (XYLOCAINE) 2 % solution Patient: Mix 1part 2% viscous lidocaine, 1part H20. Swish & swallow 83mL of diluted mixture, 87min before meals and at bedtime, up to QID prn soreness. 200 mL 3  . methadone (DOLOPHINE) 10 MG/ML solution Take 50 mg by mouth daily.    Marland Kitchen omeprazole (PRILOSEC) 20 MG capsule Take 1 capsule (20 mg total) by mouth daily. 30 capsule 0   No current facility-administered medications for this visit.   No Known Allergies   VITALS: BP 122/68 (BP Location: Right Arm, Patient Position: Sitting, Cuff Size: Normal)   Pulse 67   Temp 97.7 F (36.5 C) (Oral)    ASSESSMENT/INDICATION(S):  Patient with anticipated radiation therapy to the head and neck.   PROCEDURES: Delivery of upper and lower scatter protection devices. Appliances were tried in and adjusted as needed.  Polished. Postoperative instructions were provided in a written and verbal format concerning the use and care of appliances.   PLAN/RECOMMENDATIONS: Return after the completion of radiation therapy for a follow-up appointment.  Call if any questions or concerns arise.  All questions and concerns were invited and addressed.  The patient tolerated today's visit well and departed in stable condition.       [place], D.M.D.

## 2020-12-14 ENCOUNTER — Inpatient Hospital Stay: Payer: Medicaid Other | Admitting: General Practice

## 2020-12-14 DIAGNOSIS — C328 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: Secondary | ICD-10-CM

## 2020-12-14 NOTE — Progress Notes (Signed)
Pharmacist Chemotherapy Monitoring - Initial Assessment    Anticipated start date: 12/25/20  The following has been reviewed per standard work regarding the patient's treatment regimen: The patient's diagnosis, treatment plan and drug doses, and organ/hematologic function    Lab orders and baseline tests specific to treatment regimen  The treatment plan start date, drug sequencing, and pre-medications Prior authorization status  Patient's documented medication list, including drug-drug interaction screen and prescriptions for anti-emetics and supportive care specific to the treatment regimen The drug concentrations, fluid compatibility, administration routes, and timing of the medications to be used The patient's access for treatment and lifetime cumulative dose history, if applicable  The patient's medication allergies and previous infusion related reactions, if applicable   Changes made to treatment plan:  N/A  Follow up needed:  Make sure home antiemetics ordered   David [name], Jacqlyn Larsen, [facility], 12/14/2020  2:50 PM

## 2020-12-14 NOTE — Progress Notes (Addendum)
[facility] CSW Progress Notes  Called patient per referral from radiation oncology.  His main concern is his living situation - he is currently living with his mother and he does not feel supported by mother.  HE and mother have conflicts over his history of opiate addiction - he feels she does not understand his difficulties.  When they argue, he finds it difficult to breathe.  He also reports his pain is not controlled - states providers will not prescribe pain medications for him.  Per patient, he has not been stably housed for past two years.  Initially patient was living w brother and sleeping on couch. When he was unable to continue to live w brother, mother paid for an apartment for him.  After mother stopped paying for apartment, patient lived in a tent encampment near a retail establishment.  After his cancer diagnosis, mother brought him in to live w her and she is taking him to/from appointments.  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  While he was living outside, he came in contact w worker from [employer] [facility] Sydell Axon [phone]) - she and "Angie from housing" are reportedly trying to help him find a permanent house.  This has been unsuccessful for over a year.    He is on methadone maintenance for opiate addiction.  He reports that he gets daily methadone from [facility], mother pays the daily fee and transports him there.  He reports that he is in pain but providers will not prescribe for him due to his history of opiate addiction.  He also reports anxiety, which providers have said may be caused by an overactive thyroid, in the past he felt better on anxiety medications (Valium) but is not on these at present.    He reports that Sydell Axon or Janace Hoard are "trying to get me disability now."  He does not know whether he has actually applied, nor does he know if anyone is working on his full Medicaid application.   [place], they do not have a  referral for him so they are not working with him.  CSW will make referral w his permission.  Also reached out to [employer], [employer], she will make referral to [place] for help w filing for Medicaid.  CSW will attempt to reach [place] to determine the status of their efforts on housing stability and disability application.  Spoke w Sydell Axon, she will be working w him on both disability application and housing options.  She will refer him to winter shelter options available through [employer].  Stressed the need for stable and appropriate housing given patient's condition and treatment plan.  Referral sent by secure email to [facility].  Also spoke w Altamese Cabal - she has made referral to [place] to get help w full Medicaid application.    Updated treatment team.      Edwyna Shell, LCSW Clinical Social Worker Phone:  [phone]

## 2020-12-15 ENCOUNTER — Encounter: Payer: Self-pay | Admitting: General Practice

## 2020-12-15 NOTE — Progress Notes (Signed)
[place] Note  Faylene Kurtz by phone to schedule a follow-up appointment in my office after he sees Cassie Heilingoetter/PA on Tuesday 11/22 (ca 9:30/9:45).   [name], [place], [facility] Pager [phone] Voicemail [phone]

## 2020-12-17 ENCOUNTER — Ambulatory Visit: Payer: Medicaid Other

## 2020-12-18 ENCOUNTER — Other Ambulatory Visit: Payer: Self-pay

## 2020-12-18 ENCOUNTER — Ambulatory Visit: Admission: RE | Admit: 2020-12-18 | Payer: Medicaid Other | Source: Ambulatory Visit

## 2020-12-18 ENCOUNTER — Ambulatory Visit
Admission: RE | Admit: 2020-12-18 | Discharge: 2020-12-18 | Disposition: A | Discharge status: Home / Self Care | Payer: Medicaid Other | Source: Ambulatory Visit | Attending: Radiation Oncology | Admitting: Radiation Oncology

## 2020-12-18 DIAGNOSIS — C328 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: Secondary | ICD-10-CM

## 2020-12-18 DIAGNOSIS — Z510 Encounter for antineoplastic radiation therapy: Secondary | ICD-10-CM | POA: Diagnosis not present

## 2020-12-18 NOTE — Progress Notes (Signed)
Oncology Nurse Navigator Documentation   To provide support, encouragement and care continuity, met with [name] for his initial RT.  He was accompanied by his mother, [name].  I reviewed the 2-step treatment process, answered questions.  [name] completed treatment without difficulty, denied questions/concerns. I reviewed the registration/arrival procedure for subsequent treatments. I provided his mother with an updated calendar of David [name]'s appointments.  I answered his questions today and encouraged them to call me with questions/concerns as tmts proceed.   Harlow Asa RN, BSN, OCN Head & Neck Oncology Nurse [place] at [facility] Phone # [phone]  Fax # [phone]

## 2020-12-19 ENCOUNTER — Inpatient Hospital Stay: Payer: Medicaid Other

## 2020-12-19 ENCOUNTER — Inpatient Hospital Stay (HOSPITAL_BASED_OUTPATIENT_CLINIC_OR_DEPARTMENT_OTHER): Payer: Medicaid Other | Admitting: Physician Assistant

## 2020-12-19 ENCOUNTER — Ambulatory Visit
Admission: RE | Admit: 2020-12-19 | Discharge: 2020-12-19 | Disposition: A | Discharge status: Home / Self Care | Payer: Medicaid Other | Source: Ambulatory Visit | Attending: Radiation Oncology | Admitting: Radiation Oncology

## 2020-12-19 ENCOUNTER — Ambulatory Visit: Payer: Medicaid Other

## 2020-12-19 VITALS — BP 120/84 | HR 76 | Temp 97.3°F | Resp 20 | Wt 123.7 lb

## 2020-12-19 DIAGNOSIS — C328 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: Secondary | ICD-10-CM | POA: Diagnosis not present

## 2020-12-19 DIAGNOSIS — Z510 Encounter for antineoplastic radiation therapy: Secondary | ICD-10-CM | POA: Diagnosis not present

## 2020-12-19 DIAGNOSIS — Z7189 Other specified counseling: Secondary | ICD-10-CM

## 2020-12-19 LAB — CBC WITH DIFFERENTIAL (CANCER CENTER ONLY)
Abs Immature Granulocytes: 0.39 10*3/uL — ABNORMAL HIGH (ref 0.00–0.07)
Basophils Absolute: 0.1 10*3/uL (ref 0.0–0.1)
Basophils Relative: 0 %
Eosinophils Absolute: 0.2 10*3/uL (ref 0.0–0.5)
Eosinophils Relative: 1 %
HCT: 43.9 % (ref 39.0–52.0)
Hemoglobin: 14.6 g/dL (ref 13.0–17.0)
Immature Granulocytes: 1 %
Lymphocytes Relative: 11 %
Lymphs Abs: 3.8 10*3/uL (ref 0.7–4.0)
MCH: 28.6 pg (ref 26.0–34.0)
MCHC: 33.3 g/dL (ref 30.0–36.0)
MCV: 86.1 fL (ref 80.0–100.0)
Monocytes Absolute: 1.4 10*3/uL — ABNORMAL HIGH (ref 0.1–1.0)
Monocytes Relative: 4 %
Neutro Abs: 28.4 10*3/uL — ABNORMAL HIGH (ref 1.7–7.7)
Neutrophils Relative %: 83 %
Platelet Count: 373 10*3/uL (ref 150–400)
RBC: 5.1 MIL/uL (ref 4.22–5.81)
RDW: 14.4 % (ref 11.5–15.5)
WBC Count: 34.3 10*3/uL — ABNORMAL HIGH (ref 4.0–10.5)
nRBC: 0 % (ref 0.0–0.2)

## 2020-12-19 LAB — CMP (CANCER CENTER ONLY)
ALT: 31 U/L (ref 0–44)
AST: 21 U/L (ref 15–41)
Albumin: 3.2 g/dL — ABNORMAL LOW (ref 3.5–5.0)
Alkaline Phosphatase: 96 U/L (ref 38–126)
Anion gap: 10 (ref 5–15)
BUN: 28 mg/dL — ABNORMAL HIGH (ref 6–20)
CO2: 26 mmol/L (ref 22–32)
Calcium: 8.9 mg/dL (ref 8.9–10.3)
Chloride: 99 mmol/L (ref 98–111)
Creatinine: 0.82 mg/dL (ref 0.61–1.24)
GFR, Estimated: >60 mL/min (ref >60)
Glucose, Bld: 107 mg/dL — ABNORMAL HIGH (ref 70–99)
Potassium: 4 mmol/L (ref 3.5–5.1)
Sodium: 135 mmol/L (ref 135–145)
Total Bilirubin: 0.4 mg/dL (ref 0.3–1.2)
Total Protein: 7.6 g/dL (ref 6.5–8.1)

## 2020-12-19 LAB — MAGNESIUM: Magnesium: 1.9 mg/dL (ref 1.7–2.4)

## 2020-12-19 MED ORDER — PROCHLORPERAZINE MALEATE 10 MG PO TABS
10.0000 mg | ORAL_TABLET | Freq: Four times a day (QID) | ORAL | 2 refills | Status: DC | PRN
Start: 2020-12-19 — End: 2021-03-30

## 2020-12-19 NOTE — Patient Instructions (Addendum)
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  -We covered a lot of important information at your appointment today regarding what the treatment plan is moving forward. Here are the the main points that were discussed at your office visit with [name] today:  -The treatment that you will receive consists of one chemotherapy drugs, called Cisplatin. This is a long infusion because we have to hydrate you before and after treatment.  -We are planning on starting your treatment next week on 12/25/20 but before your start your treatment, I would like you to attend a Chemotherapy Education Class. This involves having you sit down with one of our nurse educators. She will discuss with your one-on-one more details about your treatment as well as general information about resources here at the cancer center.  -Your treatment will be given once every 3 weeks. We will check your labs once a week for just to make sure that important components of your blood are in an acceptable range  Medications:  -I have sent a few important medication prescriptions to your pharmacy.  -Compazine was sent to your pharmacy. This medication is for nausea. You may take this every 6 hours as needed if you feel nauseous.   Referrals or Imaging: -I will refer you to palliative care to help with manage your symptoms -I will reach back to social work about those applications   Follow up:  -We will see you back for a follow up visit 1 week after your first treatment to see how it went and help manage any side effects of treatment that you may have   -If you need to reach [name] at any time, the main office number to the cancer center is [phone], when you call, ask to speak to either Cassie's or Dr. Worthy Flank nurse.

## 2020-12-20 ENCOUNTER — Inpatient Hospital Stay: Payer: Medicaid Other

## 2020-12-20 ENCOUNTER — Other Ambulatory Visit: Payer: Self-pay

## 2020-12-20 ENCOUNTER — Ambulatory Visit
Admission: RE | Admit: 2020-12-20 | Discharge: 2020-12-20 | Disposition: A | Discharge status: Home / Self Care | Payer: Medicaid Other | Source: Ambulatory Visit | Attending: Radiation Oncology | Admitting: Radiation Oncology

## 2020-12-20 ENCOUNTER — Ambulatory Visit: Payer: Medicaid Other

## 2020-12-20 DIAGNOSIS — Z510 Encounter for antineoplastic radiation therapy: Secondary | ICD-10-CM | POA: Diagnosis not present

## 2020-12-21 ENCOUNTER — Encounter: Payer: Self-pay | Admitting: Internal Medicine

## 2020-12-22 ENCOUNTER — Telehealth: Payer: Self-pay | Admitting: Internal Medicine

## 2020-12-22 NOTE — Telephone Encounter (Signed)
Sch per 11/19 , unable to update sch per pt request, left msg with pt mother

## 2020-12-24 ENCOUNTER — Ambulatory Visit: Payer: Medicaid Other

## 2020-12-25 ENCOUNTER — Other Ambulatory Visit: Payer: Self-pay

## 2020-12-25 ENCOUNTER — Inpatient Hospital Stay: Payer: Medicaid Other

## 2020-12-25 ENCOUNTER — Ambulatory Visit: Payer: Medicaid Other

## 2020-12-25 ENCOUNTER — Ambulatory Visit
Admission: RE | Admit: 2020-12-25 | Discharge: 2020-12-25 | Disposition: A | Discharge status: Home / Self Care | Payer: Medicaid Other | Source: Ambulatory Visit | Attending: Radiation Oncology | Admitting: Radiation Oncology

## 2020-12-25 ENCOUNTER — Other Ambulatory Visit: Payer: Self-pay | Admitting: Radiation Oncology

## 2020-12-25 VITALS — BP 122/70 | HR 62 | Temp 99.9°F | Resp 17

## 2020-12-25 DIAGNOSIS — C328 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: Secondary | ICD-10-CM

## 2020-12-25 DIAGNOSIS — Z510 Encounter for antineoplastic radiation therapy: Secondary | ICD-10-CM | POA: Diagnosis not present

## 2020-12-25 LAB — CBC WITH DIFFERENTIAL (CANCER CENTER ONLY)
Abs Immature Granulocytes: 0.24 10*3/uL — ABNORMAL HIGH (ref 0.00–0.07)
Basophils Absolute: 0.1 10*3/uL (ref 0.0–0.1)
Basophils Relative: 0 %
Eosinophils Absolute: 0 10*3/uL (ref 0.0–0.5)
Eosinophils Relative: 0 %
HCT: 46.4 % (ref 39.0–52.0)
Hemoglobin: 15.1 g/dL (ref 13.0–17.0)
Immature Granulocytes: 1 %
Lymphocytes Relative: 5 %
Lymphs Abs: 1.1 10*3/uL (ref 0.7–4.0)
MCH: 29 pg (ref 26.0–34.0)
MCHC: 32.5 g/dL (ref 30.0–36.0)
MCV: 89.1 fL (ref 80.0–100.0)
Monocytes Absolute: 0.9 10*3/uL (ref 0.1–1.0)
Monocytes Relative: 4 %
Neutro Abs: 20.4 10*3/uL — ABNORMAL HIGH (ref 1.7–7.7)
Neutrophils Relative %: 90 %
Platelet Count: 355 10*3/uL (ref 150–400)
RBC: 5.21 MIL/uL (ref 4.22–5.81)
RDW: 14.7 % (ref 11.5–15.5)
WBC Count: 22.7 10*3/uL — ABNORMAL HIGH (ref 4.0–10.5)
nRBC: 0 % (ref 0.0–0.2)

## 2020-12-25 LAB — CMP (CANCER CENTER ONLY)
ALT: 62 U/L — ABNORMAL HIGH (ref 0–44)
AST: 39 U/L (ref 15–41)
Albumin: 3.8 g/dL (ref 3.5–5.0)
Alkaline Phosphatase: 78 U/L (ref 38–126)
Anion gap: 9 (ref 5–15)
BUN: 17 mg/dL (ref 6–20)
CO2: 31 mmol/L (ref 22–32)
Calcium: 9.9 mg/dL (ref 8.9–10.3)
Chloride: 97 mmol/L — ABNORMAL LOW (ref 98–111)
Creatinine: 0.72 mg/dL (ref 0.61–1.24)
GFR, Estimated: >60 mL/min (ref >60)
Glucose, Bld: 171 mg/dL — ABNORMAL HIGH (ref 70–99)
Potassium: 4.3 mmol/L (ref 3.5–5.1)
Sodium: 137 mmol/L (ref 135–145)
Total Bilirubin: 0.8 mg/dL (ref 0.3–1.2)
Total Protein: 7.5 g/dL (ref 6.5–8.1)

## 2020-12-25 LAB — MAGNESIUM: Magnesium: 2 mg/dL (ref 1.7–2.4)

## 2020-12-25 MED ORDER — SODIUM CHLORIDE 0.9 % IV SOLN
10.0000 mg | Freq: Once | INTRAVENOUS | Status: AC
  Administered 2020-12-25: 12:00:00 10 mg via INTRAVENOUS
  Filled 2020-12-25: qty 10

## 2020-12-25 MED ORDER — MAGNESIUM SULFATE 2 GM/50ML IV SOLN
2.0000 g | Freq: Once | INTRAVENOUS | Status: AC
  Administered 2020-12-25: 09:00:00 2 g via INTRAVENOUS
  Filled 2020-12-25: qty 50

## 2020-12-25 MED ORDER — NYSTATIN 100000 UNIT/ML MT SUSP: 5.0000 mL | Freq: Four times a day (QID) | OROMUCOSAL | 0 refills | Status: DC

## 2020-12-25 MED ORDER — PALONOSETRON HCL INJECTION 0.25 MG/5ML
0.2500 mg | Freq: Once | INTRAVENOUS | Status: AC
  Administered 2020-12-25: 12:00:00 0.25 mg via INTRAVENOUS
  Filled 2020-12-25: qty 5

## 2020-12-25 MED ORDER — POTASSIUM CHLORIDE IN NACL 20-0.9 MEQ/L-% IV SOLN
Freq: Once | INTRAVENOUS | Status: AC
  Filled 2020-12-25: qty 1000

## 2020-12-25 MED ORDER — SODIUM CHLORIDE 0.9 % IV SOLN
100.0000 mg/m2 | Freq: Once | INTRAVENOUS | Status: AC
  Administered 2020-12-25: 14:00:00 167 mg via INTRAVENOUS
  Filled 2020-12-25: qty 167

## 2020-12-25 MED ORDER — SODIUM CHLORIDE 0.9 % IV SOLN: Freq: Once | INTRAVENOUS | Status: AC

## 2020-12-25 MED ORDER — SODIUM CHLORIDE 0.9 % IV SOLN
150.0000 mg | Freq: Once | INTRAVENOUS | Status: AC
  Administered 2020-12-25: 12:00:00 150 mg via INTRAVENOUS
  Filled 2020-12-25: qty 5

## 2020-12-25 NOTE — Patient Instructions (Signed)
[facility]  Discharge Instructions: Thank you for choosing [facility] to provide your oncology and hematology care.   If you have a lab appointment with the [facility], please go directly to the [facility] and check in at the registration area.  Wear comfortable clothing and clothing appropriate for easy access to any Portacath or PICC line.   We strive to give you quality time with your provider. You may need to reschedule your appointment if you arrive late (15 or more minutes).  Arriving late affects you and other patients whose appointments are after yours.  Also, if you miss three or more appointments without notifying the office, you may be dismissed from the clinic at the provider's discretion.      For prescription refill requests, have your pharmacy contact our office and allow 72 hours for refills to be completed.    Today you received the following chemotherapy and/or immunotherapy agents Cisplatin      To help prevent nausea and vomiting after your treatment, we encourage you to take your nausea medication as directed.  BELOW ARE SYMPTOMS THAT SHOULD BE REPORTED IMMEDIATELY: *FEVER GREATER THAN 100.4 F (38 C) OR HIGHER *CHILLS OR SWEATING *NAUSEA AND VOMITING THAT IS NOT CONTROLLED WITH YOUR NAUSEA MEDICATION *UNUSUAL SHORTNESS OF BREATH *UNUSUAL BRUISING OR BLEEDING *URINARY PROBLEMS (pain or burning when urinating, or frequent urination) *BOWEL PROBLEMS (unusual diarrhea, constipation, pain near the anus) TENDERNESS IN MOUTH AND THROAT WITH OR WITHOUT PRESENCE OF ULCERS (sore throat, sores in mouth, or a toothache) UNUSUAL RASH, SWELLING OR PAIN  UNUSUAL VAGINAL DISCHARGE OR ITCHING   Items with * indicate a potential emergency and should be followed up as soon as possible or go to the Emergency Department if any problems should occur.  Please show the CHEMOTHERAPY ALERT CARD or IMMUNOTHERAPY ALERT CARD at check-in to the  Emergency Department and triage nurse. Should you have questions after your visit or need to cancel or reschedule your appointment, please contact [facility]  [phone] and follow the prompts.  Office hours are 8:00 a.m. to 4:30 p.m. Monday - Friday. Please note that voicemails left after 4:00 p.m. may not be returned until the following business day.  We are closed weekends and major holidays. You have access to a nurse at all times for urgent questions. Please call the main number to the clinic [phone] and follow the prompts.  For any non-urgent questions, you may also contact your provider using MyChart. We now offer e-Visits for anyone 21 and older to request care online for non-urgent symptoms. For details visit [email or web address].   Also download the MyChart app! Go to the app store, search "MyChart", open the app, select , and log in with your MyChart username and password.  Due to Covid, a mask is required upon entering the hospital/clinic. If you do not have a mask, one will be given to you upon arrival. For doctor visits, patients may have 1 support person aged 59 or older with them. For treatment visits, patients cannot have anyone with them due to current Covid guidelines and our immunocompromised population.

## 2020-12-25 NOTE — Progress Notes (Signed)
Verbal order " ok to run cisplatin and hydration fluids at same time" per Dr. Julien Nordmann.

## 2020-12-26 ENCOUNTER — Other Ambulatory Visit: Payer: Self-pay | Admitting: Physician Assistant

## 2020-12-26 ENCOUNTER — Ambulatory Visit
Admission: RE | Admit: 2020-12-26 | Discharge: 2020-12-26 | Disposition: A | Discharge status: Home / Self Care | Payer: Medicaid Other | Source: Ambulatory Visit | Attending: Radiation Oncology | Admitting: Radiation Oncology

## 2020-12-26 ENCOUNTER — Ambulatory Visit: Payer: Medicaid Other

## 2020-12-26 ENCOUNTER — Other Ambulatory Visit: Payer: Self-pay

## 2020-12-26 DIAGNOSIS — Z510 Encounter for antineoplastic radiation therapy: Secondary | ICD-10-CM | POA: Diagnosis not present

## 2020-12-26 DIAGNOSIS — C328 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: Secondary | ICD-10-CM

## 2020-12-27 ENCOUNTER — Ambulatory Visit: Payer: Medicaid Other

## 2020-12-27 ENCOUNTER — Other Ambulatory Visit: Payer: Self-pay

## 2020-12-27 ENCOUNTER — Ambulatory Visit
Admission: RE | Admit: 2020-12-27 | Discharge: 2020-12-27 | Disposition: A | Discharge status: Home / Self Care | Payer: Medicaid Other | Source: Ambulatory Visit | Attending: Radiation Oncology | Admitting: Radiation Oncology

## 2020-12-27 ENCOUNTER — Encounter: Payer: Self-pay | Admitting: Internal Medicine

## 2020-12-27 DIAGNOSIS — Z510 Encounter for antineoplastic radiation therapy: Secondary | ICD-10-CM | POA: Diagnosis not present

## 2020-12-28 ENCOUNTER — Inpatient Hospital Stay (HOSPITAL_BASED_OUTPATIENT_CLINIC_OR_DEPARTMENT_OTHER): Payer: Medicaid Other | Admitting: Nurse Practitioner

## 2020-12-28 ENCOUNTER — Ambulatory Visit: Payer: Medicaid Other

## 2020-12-28 ENCOUNTER — Ambulatory Visit: Payer: Medicaid Other | Admitting: Physical Therapy

## 2020-12-28 ENCOUNTER — Ambulatory Visit
Admission: RE | Admit: 2020-12-28 | Discharge: 2020-12-28 | Disposition: A | Discharge status: Home / Self Care | Payer: Medicaid Other | Source: Ambulatory Visit | Attending: Radiation Oncology | Admitting: Radiation Oncology

## 2020-12-28 DIAGNOSIS — R634 Abnormal weight loss: Secondary | ICD-10-CM | POA: Insufficient documentation

## 2020-12-28 DIAGNOSIS — F1920 Other psychoactive substance dependence, uncomplicated: Secondary | ICD-10-CM | POA: Diagnosis present

## 2020-12-28 DIAGNOSIS — R7881 Bacteremia: Secondary | ICD-10-CM | POA: Insufficient documentation

## 2020-12-28 DIAGNOSIS — G893 Neoplasm related pain (acute) (chronic): Secondary | ICD-10-CM

## 2020-12-28 DIAGNOSIS — T451X5A Adverse effect of antineoplastic and immunosuppressive drugs, initial encounter: Secondary | ICD-10-CM | POA: Insufficient documentation

## 2020-12-28 DIAGNOSIS — Z515 Encounter for palliative care: Secondary | ICD-10-CM

## 2020-12-28 DIAGNOSIS — D701 Agranulocytosis secondary to cancer chemotherapy: Secondary | ICD-10-CM | POA: Insufficient documentation

## 2020-12-28 DIAGNOSIS — R1310 Dysphagia, unspecified: Secondary | ICD-10-CM | POA: Insufficient documentation

## 2020-12-28 DIAGNOSIS — Z5900 Homelessness unspecified: Secondary | ICD-10-CM | POA: Insufficient documentation

## 2020-12-28 DIAGNOSIS — Z79899 Other long term (current) drug therapy: Secondary | ICD-10-CM | POA: Insufficient documentation

## 2020-12-28 DIAGNOSIS — Z7189 Other specified counseling: Secondary | ICD-10-CM | POA: Diagnosis not present

## 2020-12-28 DIAGNOSIS — C328 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: Secondary | ICD-10-CM | POA: Insufficient documentation

## 2020-12-28 DIAGNOSIS — R112 Nausea with vomiting, unspecified: Secondary | ICD-10-CM | POA: Insufficient documentation

## 2020-12-28 DIAGNOSIS — R530 Neoplastic (malignant) related fatigue: Secondary | ICD-10-CM

## 2020-12-28 DIAGNOSIS — Z923 Personal history of irradiation: Secondary | ICD-10-CM | POA: Insufficient documentation

## 2020-12-28 DIAGNOSIS — R490 Dysphonia: Secondary | ICD-10-CM | POA: Insufficient documentation

## 2020-12-28 DIAGNOSIS — R531 Weakness: Secondary | ICD-10-CM

## 2020-12-28 MED ORDER — DRONABINOL 2.5 MG PO CAPS: 2.5000 mg | ORAL_CAPSULE | Freq: Two times a day (BID) | ORAL | 0 refills | Status: DC

## 2020-12-28 NOTE — Therapy (Signed)
[facility] [address], [place], [place], [address] Phone: [phone]   Fax:  [phone]  Speech Language Pathology Evaluation  Patient Details  Name: David [name] MRN: [id number] Date of Birth: 02/22/1975 Referring Provider (SLP): [name], MD   Encounter Date: 12/28/2020   End of Session - 12/28/20 1020     Visit Number 1    Number of Visits 3    Date for SLP Re-Evaluation 03/28/21    Authorization Type [place]    SLP Start Time [phone]    SLP Stop Time  1001    SLP Time Calculation (min) 24 min    Activity Tolerance Patient limited by lethargy             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[place])    Pneumothorax    Left lung spontaneous pneumothorax at age 45 yr     Past Surgical History:  Procedure Laterality Date   chest tubes Left     There were no vitals filed for this visit.   Subjective Assessment - 12/28/20 0959     Subjective Pt lying down on exam table when SLP entered room. "This chemo wiped me out." SLP able to have pt transfer to chair for ST eval.    Currently in Pain? No/denies                SLP Evaluation [facility] - 12/28/20 5093       SLP Visit Information   SLP Received On 12/28/20    Referring Provider (SLP) [name], MD    Onset Date September 2022    Medical Diagnosis SCC of supraglottis - stage IV      Subjective   Patient/Family Stated Goal "keep (my swallowing) working."      General Information   HPI Pt presented to the ED on 11/20/20 with right sided neck swelling for approx. 6 weeks. 11/20/20 CT neck revealed asymmetric soft tissue thickening of the right oropharynx, extending into the right supraglottis and glottis, and enlarged right level 2 lymph node concerning for malignancy. 11/23/20 He was seen as an outpatient by Dr. Fredric Dine. FNA revealed scattered benign squamous cells in the background of blood. No definitive evidence of  dysplasia or carcinoma was identified on this limited sample. 12/01/20 Consult with Dr. Isidore Moos; 12/05/20 Consult with Dr. Julien Nordmann. 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 12/12/20 PET revealed right oropharyngeal, hypopharyngeal, and supraglottic mass extending down to the level of the glottis. Bil level II, right level III and right paracentral Vi adenopathy and left supraclavicular and a right upper thoracic paraesophageal hypermetabolic lymph node.  He will receive 35 fractions of radiation to his Larynx and bilateral neck along with chemotherapy once every 3 weeks ([place]).   He started on 12/18/20 and will complete on 02/08/21.      Prior Functional Status   Cognitive/Linguistic Baseline Within functional limits   Impulsive (?) - pt ate 1/4 sandwich in three bites     Cognition   Overall Cognitive Status Difficult to assess      Auditory Comprehension   Overall Auditory Comprehension Appears within functional limits for tasks assessed      Reading Comprehension   Reading Status Within funtional limits      Oral Motor/Sensory Function   Overall Oral Motor/Sensory Function Appears within functional limits for tasks assessed      Motor Speech   Overall  Motor Speech Appears within functional limits for tasks assessed            Eval was somewhat expeditious as pt was eager to sleep, following his radiation. Pt was actually redirected back to clinic due to leaving the building after his radiation appointment and prior to scheduled ST eval. [place] Gave reports tolerating regular diet/thin liquids. POs: He ate [place] sandwich and drank thin liquids with one small cough 1/9 swallows. SLP is unsure at this time if this was from pharyngeal difficulty; Pt appeared rather impulsive with POs, eating the 1/4 [place] sandwich in 3 large bites, with simultaneous liquid administration. Pt without overt s/sx aspiration PNA, and appeared to manage secretions  WNL. Thyroid elevation appeared adequate, and swallows appeared timely, despite incr'd mastication time. Pt's swallow deemed WNL/WFL at this time.   Because data states the risk for dysphagia during and after radiation treatment is high due to undergoing radiation tx, SLP taught pt about the possibility of reduced/limited ability for PO intake during rad tx. SLP educated pt re: changes to swallowing musculature after rad tx, and why adherence to dysphagia HEP provided today and PO consumption was necessary to inhibit muscle fibrosis following rad tx. Pt demonstrated understanding of these things to SLP.    SLP then developed a HEP for pt and pt was instructed how to perform exercises involving lingual, and pharyngeal strengthening. SLP performed each exercise and pt return demonstrated each exercise, with cues necessary. SLP ensured pt performance was correct prior to moving on to next exercise. Pt was instructed to complete this program 2 times a day,7 days/week until 6 months after his last rad tx, then x2-3 a week after that.               ADULT SLP TREATMENT - 12/28/20 1000       General Information   Behavior/Cognition Cooperative;Lethargic               SLP Education - 12/28/20 1018     Education Details HEP procedure, late effects head/neck radiation on swallow function    Person(s) Educated Patient    Methods Explanation;Demonstration;Verbal cues;Handout    Comprehension Verbalized understanding;Returned demonstration;Verbal cues required;Need further instruction             Short Term Goals 12-28-20 1050     SLP SHORT TERM GOAL #1      Title pt will complete HEP with occasional min A    Time 2    Period --   vists, for all STGs    Status New           SLP SHORT TERM GOAL #2    Title pt will tell SLP why pt is completing HEP with modified independence    Time 1    Status New           SLP SHORT TERM GOAL #3    Title pt will describe 3 overt s/s  aspiration PNA with modified independence    Time 3    Status New           SLP SHORT TERM GOAL #4    Title pt will tell SLP how a food journal could hasten return to a more normalized diet    Time 3    Status New                      Long Term Goals 12-28-20 1051  SLP LONG TERM GOAL #1    Title pt will complete HEP with modified independence over two visits    Time 4    Period --   visits, for all LTGs    Status New           SLP LONG TERM GOAL #2    Title pt will describe how to modify HEP over time, and the timeline associated with reduction in HEP frequency with modified independence over two sessions    Time 5    Status New        Plan - 12/28/20 1051        Clinical Impression Statement At this time pt swallowing is deemed WNL/WFL with regular diet item (sandwich)/thin liquids. SLP designed an individualized HEP for dysphagia and pt completed each exercise on their own with consistent min-mod cues faded to modified independent. There are no overt s/s aspiration reported by pt at this time. Data indicate that pt's swallow ability will likely decrease over the course of radiation/chemotherapy and could very well decline over time following conclusion of their radiation therapy due to muscle disuse atrophy and/or muscle fibrosis.  *Pt will cont to need to be seen by SLP* in order to assess safety of PO intake, assess the need for recommending any objective swallow assessment, and ensuring pt correctly completes the individualized HEP.    Speech Therapy Frequency --   once approx every four weeks    Duration --   90 days/2 therapy sessions for this reporting period;  Overall plan of care to include approx 7 visits    Treatment/Interventions Aspiration precaution training;Pharyngeal strengthening exercises;Diet toleration management by SLP;Trials of upgraded texture/liquids;Patient/family education;SLP instruction and feedback;Compensatory techniques    Potential  to Achieve Goals Fair: SLP is concerned for decr'd participation/cooperation, and incr'd lethargy    SLP Home Exercise Plan provided    Consulted and Agree with Plan of Care Patient         Patient will benefit from skilled therapeutic intervention in order to improve the following deficits and impairments:   Dysphagia, unspecified type    Problem List Patient Active Problem List   Diagnosis Date Noted   Goals of care, counseling/discussion 12/13/2020   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 ([place]) 12/04/2020   Encounter for preoperative dental examination 12/04/2020   Teeth missing 12/04/2020   Caries 12/04/2020   Retained tooth root 12/04/2020   Chronic apical periodontitis 12/04/2020   Impacted third molar tooth 12/04/2020   Attrition, teeth excessive 12/04/2020   Chronic periodontitis 12/04/2020   Accretions on teeth 12/04/2020   Incipient enamel caries 12/04/2020    [place], CCC-SLP 12/28/2020, 10:22 AM  [facility] [address] [place], [place], [address] Phone: [phone]   Fax:  [phone]  Name: David [name] MRN: [id number] Date of Birth: [birth year]

## 2020-12-28 NOTE — Patient Instructions (Signed)
SWALLOWING EXERCISES Do these until 6 months after your last day of radiation, then 2-3 times per week afterwards  Effortful Swallows - Press your tongue against the roof of your mouth for 3 seconds, then squeeze the muscles in your neck while you swallow your saliva or a sip of water - Repeat 10-15 times, 2-3 times a day, and use whenever you eat or drink  [place] Swallow - swallow with your tongue sticking out - Stick tongue out past your lips and gently bite tongue with your teeth - Swallow, while holding your tongue with your teeth - Repeat 10-15 times, 2-3 times a day *use a wet spoon if your mouth gets dry*  M[place]elsohn Maneuver - "half swallow" exercise - Start to swallow, and keep your Adam's apple up by squeezing hard with the muscles of the throat - Hold the squeeze for 5-7 seconds and then relax - Repeat 10-15 times, 2-3 times a day *use a wet spoon if your mouth gets dry*   

## 2020-12-28 NOTE — Progress Notes (Signed)
[place]  [phone] [phone]   Name: David [name] Date: 12/28/2020 MRN: [id number]  DOB: June 30, 1975  Patient Care Team: Default, Provider, MD as PCP - General    REASON FOR CONSULTATION: David [name] is a 45 y.o. male with multiple medical problems including stage IV squamous cell carcinoma, right supraglottic mass, adenopathy, s/p initial concurrent chemoradiation 12/25/2020), history of drug use currently followed by methadone clinic, homelessness, and some malnutrition.  Palliative ask to see for symptom management and goals of care.    SOCIAL HISTORY:     reports that he has been smoking cigarettes. He has been smoking an average of 3 packs per day. He has never used smokeless tobacco. He reports that he does not currently use alcohol after a past usage of about 12.0 standard drinks per week. He reports that he does not currently use drugs after having used the following drugs: IV.  ADVANCE DIRECTIVES:  Patient reports he does not have advanced directives.  Would like to further discuss in the future.  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[place])    Pneumothorax    Left lung spontaneous pneumothorax at age 62 yr     PAST SURGICAL HISTORY:  Past Surgical History:  Procedure Laterality Date   chest tubes Left     HEMATOLOGY/ONCOLOGY HISTORY:  Oncology History  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 ([facility])  12/01/2020 Cancer Staging   Staging form: Larynx - Supraglottis, AJCC 8th Edition - Clinical stage from 12/01/2020: Stage IVB (cT3, cN3b, cM0) - Signed by [name], MD on 12/20/2020 Stage prefix: Initial diagnosis    12/04/2020 Initial Diagnosis   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 ([place])   12/25/2020 -  Chemotherapy   Patient is on Treatment Plan : HEAD/NECK Cisplatin + XRT q21d        ALLERGIES:  has No Known Allergies.  MEDICATIONS:  Current Outpatient Medications  Medication Sig Dispense Refill   dronabinol (MARINOL) 2.5 MG capsule Take 1 capsule (2.5 mg total) by mouth 2 (two) times daily before a meal. 60 capsule 0   amoxicillin-clavulanate (AUGMENTIN) 875-125 MG tablet Take 1 tablet by mouth 2 (two) times daily. One po bid x 7 days (Patient not taking: No sig reported) 14 tablet 0   dexamethasone (DECADRON) 4 MG tablet Take 1 tablet PO BID; take with food. 40 tablet 0   fluticasone (FLONASE) 50 MCG/ACT nasal spray Place 2 sprays into both nostrils daily. (Patient not taking: Reported on 12/05/2020) 18 mL 2   ibuprofen (ADVIL,MOTRIN) 200 MG tablet Take 600 mg by mouth every 6 (six) hours as needed for moderate pain.     lidocaine (XYLOCAINE) 2 % solution Patient: Mix 1part 2% viscous lidocaine, 1part H20. Swish & swallow 64m of diluted mixture, 332m before meals and at bedtime, up to QID prn soreness. 200 mL 3   methadone (DOLOPHINE) 10 MG/ML solution Take 50 mg by mouth daily.     nystatin (MYCOSTATIN) 100000 UNIT/ML suspension Take 5 mLs (500,000 Units total) by mouth 4 (four) times daily. Swish for 1 minute and swallow 4 times a day. 420 mL 0   omeprazole (PRILOSEC) 20 MG capsule Take 1 capsule (20 mg total) by mouth daily. 30 capsule 0   prochlorperazine (COMPAZINE) 10 MG tablet Take 1 tablet (10 mg total) by mouth every 6 (six) hours as needed. 30 tablet 2  No current facility-administered medications for this visit.    VITAL SIGNS: There were no vitals taken for this visit. There were no vitals filed for this visit.  Estimated body mass index is 17.75 kg/m as calculated from the following:   Height as of 12/13/20: _0  (1.778 m).   Weight as of 12/19/20: 123 lb 11.2 oz (56.1 kg).  LABS: CBC:    Component Value Date/Time   WBC 22.7 (H) 12/25/2020 0804   WBC 11.1 (H) 12/08/2020 1111   HGB 15.1 12/25/2020 0804   HCT 46.4 12/25/2020 0804   PLT  355 12/25/2020 0804   MCV 89.1 12/25/2020 0804   MCV 100.9 (A) 07/27/2011 1302   NEUTROABS 20.4 (H) 12/25/2020 0804   LYMPHSABS 1.1 12/25/2020 0804   MONOABS 0.9 12/25/2020 0804   EOSABS 0.0 12/25/2020 0804   BASOSABS 0.1 12/25/2020 0804   Comprehensive Metabolic Panel:    Component Value Date/Time   NA 137 12/25/2020 0804   K 4.3 12/25/2020 0804   CL 97 (L) 12/25/2020 0804   CO2 31 12/25/2020 0804   BUN 17 12/25/2020 0804   CREATININE 0.72 12/25/2020 0804   GLUCOSE 171 (H) 12/25/2020 0804   CALCIUM 9.9 12/25/2020 0804   AST 39 12/25/2020 0804   ALT 62 (H) 12/25/2020 0804   ALKPHOS 78 12/25/2020 0804   BILITOT 0.8 12/25/2020 0804   PROT 7.5 12/25/2020 0804   ALBUMIN 3.8 12/25/2020 0804    RADIOGRAPHIC STUDIES: NM PET Image Initial (PI) Skull Base To Thigh  Result Date: 12/13/2020 CLINICAL DATA:  Initial treatment strategy for right pharyngeal squamous cell carcinoma. EXAM: NUCLEAR MEDICINE PET SKULL BASE TO THIGH TECHNIQUE: 6.4 mCi F-18 FDG was injected intravenously. Full-ring PET imaging was performed from the skull base to thigh after the radiotracer. CT data was obtained and used for attenuation correction and anatomic localization. Fasting blood glucose: 59 mg/dl COMPARISON:  CT neck 11/20/2020 FINDINGS: Mediastinal blood pool activity: SUV max 1.4 Liver activity: SUV max N/A NECK: Right eccentric oropharyngeal and hypopharyngeal mass extends caudad in the right paralaryngeal space along the right aryepiglottic fold down to the level of the cords, extending over a 6.3 cm vertical excursion with maximum SUV 24.0. There is bilateral hypermetabolic level IIa, right level III, and right superficial anterior cervical adenopathy (VI). The dominant right level II lymph node measures proximally 3.2 cm in short axis with maximum SUV 18.4. The index right level VI lymph node measures 1.1 cm in short axis on image 54 series 4 with maximum SUV 9.3. Incidental CT findings: Mild bilateral  common carotid atherosclerotic calcification. CHEST: Hypermetabolic left supraclavicular and upper thoracic paraesophageal adenopathy. Index supraclavicular node 0.8 cm in short axis on image 62 series 4, maximum SUV 7.4. Index upper left paraesophageal lymph node 0.8 cm in short axis on image 56 series 4, maximum SUV 4.5. Faint patchy ground-glass opacities in the right upper lobe, left lower lobe, and left upper lobe are identified. The left upper lobe ground-glass opacity shown on image 35 series 8 has maximum SUV of 1.5. These are likely inflammatory given the distribution although surveillance chest CT is likely warranted to exclude synchronous low-grade adenocarcinoma. Incidental CT findings: Atherosclerotic calcification of the thoracic aorta and branch vessels. ABDOMEN/PELVIS: No significant abnormal hypermetabolic activity in this region. Incidental CT findings: Atherosclerosis is present, including aortoiliac atherosclerotic disease. Prominent stool throughout the colon favors constipation. SKELETON: No significant abnormal hypermetabolic activity in this region. Incidental CT findings: none IMPRESSION: 1. Right oropharyngeal, hypopharyngeal, and  supraglottic mass extends down to the level of the glottis, with a 6.3 cm vertical excursion in maximum SUV of 24.0. 2. Associated bilateral hypermetabolic level II, right level III, and right paracentral VI adenopathy along with left supraclavicular and a right upper thoracic paraesophageal hypermetabolic lymph node. 3. Faint patchy ground-glass opacities in both lungs are identified with low-grade activity. These are likely inflammatory given the distribution although surveillance chest CT is likely warranted in 3-6 months to exclude synchronous low-grade adenocarcinoma. 4. Aortic Atherosclerosis (ICD10-I70.0). Aortic branch vessel atherosclerosis. 5.  Prominent stool throughout the colon favors constipation. Electronically Signed   By: Van Clines M.D.    On: 12/13/2020 07:15   [name] CORE BIOPSY (SOFT TISSUE)  Result Date: 12/08/2020 INDICATION: 45 year old male with a right neck mass, CT imaging demonstrating head neck carcinoma EXAM: ULTRASOUND-GUIDED RIGHT NECK MASS BIOPSY MEDICATIONS: None. ANESTHESIA/SEDATION: None FLUOROSCOPY TIME:  None COMPLICATIONS: None PROCEDURE: Informed written consent was obtained from the patient after a thorough discussion of the procedural risks, benefits and alternatives. All questions were addressed. Maximal Sterile Barrier Technique was utilized including caps, mask, sterile gowns, sterile gloves, sterile drape, hand hygiene and skin antiseptic. A timeout was performed prior to the initiation of the procedure. Ultrasound survey was performed with images stored and sent to PACs. The right neck was prepped with chlorhexidine in a sterile fashion, and a sterile drape was applied covering the operative field. A sterile gown and sterile gloves were used for the procedure. Local anesthesia was provided with 1% Lidocaine. Ultrasound guidance was used to infiltrate the region with 1% lidocaine for local anesthesia. Small stab incision was made with 11 blade scalpel. Four separate 16 gauge core biopsy were then acquired of the right neck nodal mass using ultrasound guidance. Images were stored. Final image was stored after biopsy. Patient tolerated the procedure well and remained hemodynamically stable throughout. No complications were encountered and no significant blood loss was encounter IMPRESSION: Status post ultrasound-guided right neck mass biopsy. Signed, Dulcy Fanny. Dellia Nims, RPVI [facility] Electronically Signed   By: Corrie Mckusick D.O.   On: 12/08/2020 15:13    PERFORMANCE STATUS (ECOG) : 1 - Symptomatic but completely ambulatory  Review of Systems  Constitutional:  Positive for appetite change.  HENT:  Positive for trouble swallowing.        Throat pain   Neurological:  Positive for weakness.  Unless otherwise noted, a complete review of systems is negative.  Physical Exam General: NAD, thin Cardiovascular: regular rate and rhythm Pulmonary: clear ant fields, normal breathing pattern, right-sided neck mass Abdomen: soft, nontender, + bowel sounds Extremities: no edema, no joint deformities Neurological: AAOx3, mood appropriate  IMPRESSION:  David [name] presents to the clinic today for his initial visit with palliative.  He initially requested for his mother, David [name] not to be present during discussions however with strong recommendation and ability to create a safe and appropriate plan he was in agreement to allow her to engage in discussions.  I introduced myself and Hulan Fess in addition to the role of palliative in collaboration with his oncology medical team. Concept of Palliative Care was introduced as specialized medical care for people and their families living with serious illness.  It focuses on providing relief from the symptoms and stress of a serious illness.  The goal is to improve quality of life for both the patient and the family. Values and goals of care important to patient and family were attempted to be  elicited.  Patient and mother verbalized understanding and appreciation.  Patient shares he does not have any children and has been married twice. He is a Programmer, systems. States his first marriage initiated after he met a woman from [place] on a [employer] cruise. She was able to obtain her green card and their marriage did not last. His second marriage was to an [place] male who he met oversees and unfortunately they divorced after 2 years. 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 He acknowledges his previous heroin addiction. States he is no longer using but does endorse occasional homelessness due to some psychosocial/family dynamics.  He is currently staying in the home with his mother.  David [name] states she wants  nothing more than to help her son as she knows his health is in a significant decline and he has serious things going on.  We discussed Her current illness and what it means in the larger context of Her on-going co-morbidities. Natural disease trajectory and expectations were discussed.  Both patient and mother verbalized understanding.  His mother states plans to allow patient to live with her to allow him every opportunity to undergo recommended treatments/therapies allow him every opportunity to continue to thrive.  We discussed at length patient's current use of methadone in addition to his ongoing pain.  He reports most of his pain is with swallowing and with large right neck mass.  He is anxious expressing concerns for ongoing pain and inability to control.  Support provided.  Patient currently goes to [facility] daily for methadone.  He was drug test on today.  We discussed ongoing pain support given his significant cancer diagnosis.  Detailed education provided including pain contract and urine drug screening.  Patient's mother states he does have a lock box that he can keep his methadone in as this was also required by [facility].  Patient is in agreement and understands criteria for being discharged from our symptom management/palliative care clinic.  Opioid medication treatment agreement and controlled medications contract signed.  Patient has been advised to return to [facility] on tomorrow as scheduled until I am able to communicate with providers at the location and obtain urine drug test history and other medical records.  If all documents are appropriate palliative will resume methadone management and make adjustments as needed as discussed and approved with my Medical Director [facility]). Patient and mother verbalized understanding.  David [name] is tearful expressing appreciation of our support.  Patient has recently been approved for Medicaid.  We also discussed patient's decreased  appetite.  He is unsure but does endorse some weight loss over the past 1-3 months.  Education provided on the use of appetite stimulant.  I discussed the importance of continued conversation with family and their medical providers regarding overall plan of care and treatment options, ensuring decisions are within the context of the patients values and GOCs.  PLAN: Marinol for appetite stimulant Patient will continue with methadone 60 mg daily.  I will plan to follow-up with [facility] and obtain medical records and urine drug screen history.  Of all information is appropriate and valid with no signs of concern palliative will begin managing his methadone and make adjustments as needed.He will continue to follow-up at their clinic until further directions have been provided and he has been approved for our clinic to begin managing his pain/methadone.  Detailed discussion with education provided to both patient and [name] regarding opioid medication treatment agreement and contract.  They both  verbalized understanding and awareness of all strict stipulations.  Contracts have been signed.  Patient is able to verbalize awareness of expectations and grounds for termination from palliative care. Mother expresses plans for patient to reside in her home to allow patient to have a safe and controlled space in the setting of his cancer and treatments.  They are remaining hopeful for every opportunity to allow David [name] to continue to thrive for as long as he can. I will plan to see patient back in the clinic on next week to evaluate medication use.  He knows he will need to bring in his bottle in the locked box at his next visit.   Patient and mother expressed understanding and was in agreement with this plan. He also understands that He can call the clinic at any time with any questions, concerns, or complaints.    Time Total: 55 min  Visit consisted of counseling and education dealing with the  complex and emotionally intense issues of symptom management and palliative care in the setting of serious and potentially life-threatening illness.Greater than 50%  of this time was spent counseling and coordinating care related to the above assessment and plan.  Signed by: Alda Lea, AGPCNP-BC Palliative Medicine Team

## 2020-12-29 ENCOUNTER — Ambulatory Visit
Admission: RE | Admit: 2020-12-29 | Discharge: 2020-12-29 | Disposition: A | Discharge status: Home / Self Care | Payer: Medicaid Other | Source: Ambulatory Visit | Attending: Radiation Oncology | Admitting: Radiation Oncology

## 2020-12-29 ENCOUNTER — Ambulatory Visit: Payer: Medicaid Other

## 2020-12-29 ENCOUNTER — Other Ambulatory Visit: Payer: Self-pay

## 2020-12-29 ENCOUNTER — Inpatient Hospital Stay: Payer: Medicaid Other | Admitting: Dietician

## 2020-12-29 DIAGNOSIS — C328 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: Secondary | ICD-10-CM | POA: Diagnosis not present

## 2020-12-29 NOTE — Progress Notes (Signed)
Nutrition Assessment   Reason for Assessment: Head & Neck   ASSESSMENT: 45 year old male with newly diagnosed stage IV SCC of overlapping sits of larynx. He is receiving concurrent chemoradiation with cisplatin every 3 weeks. Patient is followed by Dr. Mohammed and Dr. Squire  Past medical history includes IV drug abuse, homelessness, spontaneous pneumothorax.   Met with patient in office. Patient reports having a good appetite. He is staying with his mother, plans to remain at his moms through out treatment. Patient reports fatigue and significant nausea for 2 days after first receiving first cisplatin. This has resolved, reports compazine worked well for him. Patient reports tolerating radiation, denies swallowing difficulty, altered taste, dry mouth, thick saliva, sore throat. Patient requesting to know all side effects and what to expect up front. He is confident that he will not need feeding tube, but will consider this if ability to eat/drink diminishes or his weights decline.   Nutrition Focused Physical Exam: deferred   Medications: Marinol, compazine, decadron, xylocaine, methadone, nystatin   Labs: 11/28 - glucose 171   Anthropometrics:   Height: 5'10" Weight: 123 lb 11.2 oz  UBW: 125-130 lb (per pt) BMI: 17.75   Estimated Energy Needs (30-33 g/kg/IBW to promote wt gain)  Kcals: 2265-2500 Protein: 113-128 Fluid: 2.3 L   NUTRITION DIAGNOSIS: Predicted suboptimal intake related to cancer and associated treatments as evidenced by side effects of radiation therapy affecting ability to eat   INTERVENTION:  Educated on importance of increased calorie and protein needs to maintain weights, strength, nutrition Encouraged high calorie high protein foods  Discussed side effects of radiation therapy affecting ability to eat/drink - handouts on oral care, sore throat, dry mouth/thick saliva, altered taste, foods easy to chew/swallow, soft moist high protein foods  provided Recommend drinking oral nutrition supplement for added calories and protein, provided samples of Ensure Complete and CIB powder for pt to try Briefly discussed feeding tube, pt open to having PEG placed if needed. Will work with patient to minimize need  Patient has contact information    MONITORING, EVALUATION, GOAL: Patient will tolerate increased calories and protein to promote weight gain   Next Visit: Thursday December 8 with Barbara       

## 2020-12-29 NOTE — Progress Notes (Signed)
[facility] OFFICE PROGRESS NOTE  Default, Provider, MD No address on file  DIAGNOSIS: Stage IV (T3, N3, M0) squamous cell carcinoma.  Patient presented with right oropharyngeal, hypopharyngeal, and supraglottic mass that extends down to the level of the glottis.  He also has bilateral paracentral adenopathy and left supraclavicular and right upper thoracic paraesophageal adenopathy.  He was diagnosed in November 2022.  PRIOR THERAPY: None   CURRENT THERAPY:  Concurrent chemoradiation with cisplatin 100 mg/m2  every 3 weeks with concurrent radiation under the care of Dr. Isidore Moos.  First dose expected on 12/25/20. Status post 1 cycle.   INTERVAL HISTORY: David [name] 45 y.o. male returns to the clinic today for a follow-up visit accompanied by his mother.  The patient was recently found to have head and neck cancer.  He was last seen in clinic on 12/19/2020.  At that point in time, discussed his treatment plan which consists of chemo and radiation.  The patient underwent his first cycle of chemotherapy last week on 12/25/2020 and tolerated it fair. He had low energy a few days following treatment. He also developed nausea/vomiting today. He had 3 episodes of emesis today, once if which was during the nurse evaluation in the exam room today. He took 1 compazine today. He also notes he is not drinking water like he should. He also is currently undergoing radiation and his last radiation treatment scheduled for 02/08/2021.  The patient has several psychosocial concerns related to IV drug use and homelessness.  The patient is followed closely by the Education officer, museum.  The patient also saw palliative care last week which was very helpful for him.  The patient is presently living with his mother.  The patient goes to the methadone clinic but palliative care is going to work closely with the methadone clinic and the patient regarding appropriate pain management. They are scheduled to follow  up with him later this week on 01/04/21. The patient also has been having some issues related to weight loss due to the trouble swallowing from his neck mass.  The patient follows with speech and language pathology outpatient for rehabilitation to ensure appropriate swallowing people the patient is also followed closely by nutrition and he saw them on 12/29/2020.  He also scheduled to see them again later this week on 01/04/2021.  He was started on Marinol to help stimulate his appetite by palliative care but has not been able to start/receive this medication yet.  Regarding his swallowing, he also has a prescription for lidocaine. He stated that he is able to swallow and get food down. His main concern is related to taste changes. He is taking nystatin for thrush. No appreciable thrush on exam today. He states he noted taste alterations after chemotherapy. He has poor dentition/oral hygiene and has a follow up with Dr. Benson Norway in January.   The patient denies any fever, chills, or night sweats.  He lost a few pounds since his last appointment due to the taste alterations.  He denies any regular shortness of breath, chest discomfort, hemoptysis, or cough except for when he produces phlegm. The patient denies any headache or visual changes.  Denies any peripheral neuropathy.  Denies diarrhea or constipation. Denies alcohol use. He reports he took 1 extra strength tylenol today. He is here today for evaluation and 1 week follow-up visit to manage any adverse side effects of treatment.     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  MEDICATIONS:  Current Outpatient Medications  Medication Sig Dispense Refill   ondansetron (ZOFRAN) 8 MG tablet Take 1 tablet (8 mg total) by mouth every 8 (eight) hours as needed for nausea or vomiting. Starting day 3 after  chemotherapy 30 tablet 2   potassium chloride SA (KLOR-CON M) 20 MEQ tablet Take 1 tablet (20 mEq total) by mouth daily. 5 tablet 0   amoxicillin-clavulanate (AUGMENTIN) 875-125 MG tablet Take 1 tablet by mouth 2 (two) times daily. One po bid x 7 days 14 tablet 0   dexamethasone (DECADRON) 4 MG tablet Take 1 tablet PO BID; take with food. 40 tablet 0   dronabinol (MARINOL) 2.5 MG capsule Take 1 capsule (2.5 mg total) by mouth 2 (two) times daily before a meal. 60 capsule 0   fluticasone (FLONASE) 50 MCG/ACT nasal spray Place 2 sprays into both nostrils daily. (Patient not taking: Reported on 12/05/2020) 18 mL 2   ibuprofen (ADVIL,MOTRIN) 200 MG tablet Take 600 mg by mouth every 6 (six) hours as needed for moderate pain.     lidocaine (XYLOCAINE) 2 % solution Patient: Mix 1part 2% viscous lidocaine, 1part H20. Swish & swallow 38mL of diluted mixture, 24min before meals and at bedtime, up to QID prn soreness. 200 mL 3   methadone (DOLOPHINE) 10 MG/5ML solution Take 10 mLs (20 mg total) by mouth every 8 (eight) hours as needed for pain. 140 mL 0   methadone (DOLOPHINE) 10 MG/ML solution Take 60 mg by mouth daily.     nystatin (MYCOSTATIN) 100000 UNIT/ML suspension Take 5 mLs (500,000 Units total) by mouth 4 (four) times daily. Swish for 1 minute and swallow 4 times a day. 420 mL 0   omeprazole (PRILOSEC) 20 MG capsule Take 1 capsule (20 mg total) by mouth daily. 30 capsule 0   prochlorperazine (COMPAZINE) 10 MG tablet Take 1 tablet (10 mg total) by mouth every 6 (six) hours as needed. 30 tablet 2   No current facility-administered medications for this visit.    SURGICAL HISTORY:  Past Surgical History:  Procedure Laterality Date   chest tubes Left     REVIEW OF SYSTEMS:   Constitutional: Positive for appetite change secondary to taste changes, fatigue, and weight loss. Negative for chills and fever. HENT: Positive for visible right sided neck mass. Negative for mouth sores, nosebleeds, sore  throat. Eyes: Negative for eye problems and icterus.  Respiratory: Negative for cough, hemoptysis, shortness of breath and wheezing.   Cardiovascular: Negative for chest pain and leg swelling.  Gastrointestinal: Positive for nausea/vomiting. Negative for abdominal pain, constipation, or diarrhea. Genitourinary: Negative for bladder incontinence, difficulty urinating, dysuria, frequency and hematuria.   Musculoskeletal: Positive for neck stiffness due to mass. Negative for back pain, gait problem.  Skin: Negative for itching and rash.  Neurological: Negative for dizziness, extremity weakness, gait problem, headaches, light-headedness and seizures.  Hematological: Positive for cervical adenopathy. Does not bruise/bleed easily.  Psychiatric/Behavioral: Negative for confusion, depression and sleep disturbance. The patient is not nervous/anxious.     PHYSICAL EXAMINATION:  Blood pressure 117/87, pulse 82, resp. rate 17, weight 118 lb 1 oz (53.6 kg), SpO2 100 %.  ECOG PERFORMANCE STATUS: 1  Physical Exam  Constitutional: Oriented to person, place, and time and thin appearing male, and in no distress.  HENT:  Head: Normocephalic and atraumatic.  Mouth/Throat: Positive for right sided neck  mass.  Eyes: Conjunctivae are normal. Right eye exhibits no discharge. Left eye exhibits no discharge. No scleral icterus.  Neck: Normal range of motion. Neck supple.  Cardiovascular: Normal rate, regular rhythm, normal heart sounds and intact distal pulses.   Pulmonary/Chest: Effort normal and breath sounds normal. No respiratory distress. No wheezes. No rales.  Abdominal: Soft. Bowel sounds are normal. Exhibits no distension and no mass. There is no tenderness.  Musculoskeletal: Normal range of motion. Exhibits no edema.  Lymphadenopathy:    Positive for cervical adenopathy.   Neurological: Alert and oriented to person, place, and time. Exhibits normal muscle tone. Gait normal. Coordination normal.  Skin:  Skin is warm and dry. No rash noted. Not diaphoretic. No erythema. No pallor.  Psychiatric: Mood, memory and judgment normal.  Vitals reviewed.  LABORATORY DATA: Lab Results  Component Value Date   WBC 7.1 01/01/2021   HGB 13.3 01/01/2021   HCT 37.7 (L) 01/01/2021   MCV 82.5 01/01/2021   PLT 161 01/01/2021      Chemistry      Component Value Date/Time   NA 135 01/01/2021 1423   K 3.2 (L) 01/01/2021 1423   CL 96 (L) 01/01/2021 1423   CO2 29 01/01/2021 1423   BUN 23 (H) 01/01/2021 1423   CREATININE 0.84 01/01/2021 1423      Component Value Date/Time   CALCIUM 8.8 (L) 01/01/2021 1423   ALKPHOS 87 01/01/2021 1423   AST 93 (H) 01/01/2021 1423   ALT 120 (H) 01/01/2021 1423   BILITOT 0.8 01/01/2021 1423       RADIOGRAPHIC STUDIES:  NM PET Image Initial (PI) Skull Base To Thigh  Result Date: 12/13/2020 CLINICAL DATA:  Initial treatment strategy for right pharyngeal squamous cell carcinoma. EXAM: NUCLEAR MEDICINE PET SKULL BASE TO THIGH TECHNIQUE: 6.4 mCi F-18 FDG was injected intravenously. Full-ring PET imaging was performed from the skull base to thigh after the radiotracer. CT data was obtained and used for attenuation correction and anatomic localization. Fasting blood glucose: 59 mg/dl COMPARISON:  CT neck 11/20/2020 FINDINGS: Mediastinal blood pool activity: SUV max 1.4 Liver activity: SUV max N/A NECK: Right eccentric oropharyngeal and hypopharyngeal mass extends caudad in the right paralaryngeal space along the right aryepiglottic fold down to the level of the cords, extending over a 6.3 cm vertical excursion with maximum SUV 24.0. There is bilateral hypermetabolic level IIa, right level III, and right superficial anterior cervical adenopathy (VI). The dominant right level II lymph node measures proximally 3.2 cm in short axis with maximum SUV 18.4. The index right level VI lymph node measures 1.1 cm in short axis on image 54 series 4 with maximum SUV 9.3. Incidental CT  findings: Mild bilateral common carotid atherosclerotic calcification. CHEST: Hypermetabolic left supraclavicular and upper thoracic paraesophageal adenopathy. Index supraclavicular node 0.8 cm in short axis on image 62 series 4, maximum SUV 7.4. Index upper left paraesophageal lymph node 0.8 cm in short axis on image 56 series 4, maximum SUV 4.5. Faint patchy ground-glass opacities in the right upper lobe, left lower lobe, and left upper lobe are identified. The left upper lobe ground-glass opacity shown on image 35 series 8 has maximum SUV of 1.5. These are likely inflammatory given the distribution although surveillance chest CT is likely warranted to exclude synchronous low-grade adenocarcinoma. Incidental CT findings: Atherosclerotic calcification of the thoracic aorta and branch vessels. ABDOMEN/PELVIS: No significant abnormal hypermetabolic activity in this region. Incidental CT findings: Atherosclerosis is present, including aortoiliac atherosclerotic disease. Prominent stool throughout  the colon favors constipation. SKELETON: No significant abnormal hypermetabolic activity in this region. Incidental CT findings: none IMPRESSION: 1. Right oropharyngeal, hypopharyngeal, and supraglottic mass extends down to the level of the glottis, with a 6.3 cm vertical excursion in maximum SUV of 24.0. 2. Associated bilateral hypermetabolic level II, right level III, and right paracentral VI adenopathy along with left supraclavicular and a right upper thoracic paraesophageal hypermetabolic lymph node. 3. Faint patchy ground-glass opacities in both lungs are identified with low-grade activity. These are likely inflammatory given the distribution although surveillance chest CT is likely warranted in 3-6 months to exclude synchronous low-grade adenocarcinoma. 4. Aortic Atherosclerosis (ICD10-I70.0). Aortic branch vessel atherosclerosis. 5.  Prominent stool throughout the colon favors constipation. Electronically Signed   By:  Van Clines M.D.   On: 12/13/2020 07:15   [name] CORE BIOPSY (SOFT TISSUE)  Result Date: 12/08/2020 INDICATION: 45 year old male with a right neck mass, CT imaging demonstrating head neck carcinoma EXAM: ULTRASOUND-GUIDED RIGHT NECK MASS BIOPSY MEDICATIONS: None. ANESTHESIA/SEDATION: None FLUOROSCOPY TIME:  None COMPLICATIONS: None PROCEDURE: Informed written consent was obtained from the patient after a thorough discussion of the procedural risks, benefits and alternatives. All questions were addressed. Maximal Sterile Barrier Technique was utilized including caps, mask, sterile gowns, sterile gloves, sterile drape, hand hygiene and skin antiseptic. A timeout was performed prior to the initiation of the procedure. Ultrasound survey was performed with images stored and sent to PACs. The right neck was prepped with chlorhexidine in a sterile fashion, and a sterile drape was applied covering the operative field. A sterile gown and sterile gloves were used for the procedure. Local anesthesia was provided with 1% Lidocaine. Ultrasound guidance was used to infiltrate the region with 1% lidocaine for local anesthesia. Small stab incision was made with 11 blade scalpel. Four separate 16 gauge core biopsy were then acquired of the right neck nodal mass using ultrasound guidance. Images were stored. Final image was stored after biopsy. Patient tolerated the procedure well and remained hemodynamically stable throughout. No complications were encountered and no significant blood loss was encounter IMPRESSION: Status post ultrasound-guided right neck mass biopsy. Signed, Dulcy Fanny. Dellia Nims, RPVI [facility] Electronically Signed   By: Corrie Mckusick D.O.   On: 12/08/2020 15:13     ASSESSMENT/PLAN:  This is a very pleasant 45 year old Caucasian male with head and neck cancer, squamous cell carcinoma stage IV (T3, N3, M0).  He presented with right  oropharyngeal, hypopharyngeal, and supraglottic mass that extends down to the level of the glottis.  He also has bilateral paracentral adenopathy and left supraclavicular and right upper thoracic paraesophageal adenopathy.  He was diagnosed in November 2022.   The patient is currently undergoing treatment with chemoradiation.  He is receiving cisplatin 100 mg per metered squared IV every 3 weeks.  He status post 1 cycle and tolerated it fair except for delayed nausea/vomiting today and some fatigue.   The patient is currently undergoing radiation under the care of Dr. Isidore Moos.  Last radiation expected on 02/08/2021.  The patient was seen with Dr. Julien Nordmann today. The patient had questions regarding his prognosis, which Dr. Julien Nordmann answered to the patient's satisfaction.  Labs were reviewed.  His labs show mild hypokalemia. I have sent a prescription for potassium 20 meq to take 1 tablet daily for 5 days. If needed, he can crush this and put in applesauce to swallow if needed.   We were arranging for IVF and 8 mg of zofran today  due to his nausea/vomiting and decreased fluid intake. He notes his urine is concentrated and he is not drinking a lot of water. However, after radiation, the patient reported that he was hungry and did not want to stay for IVF and left.   Reinforced the risk for nephrotoxicity with cisplatin. I also gave him a prescription for zofran at home to take. Advised to alternate with compazine if needed for nausea. Discussed zofran is eery 8 hours PRN for nausea starting day 3 after chemotherapy.   For the taste changes, advised to have good dentition, use salt water rinses, and use biotene. I did not appreciate thrush on exam today but he had been taking nystatin.   We will see him back for follow-up visit in 2 weeks for evaluation before starting cycle #2.  He will continue to follow with palliative care and the social worker closely.  He will continue to take Marinol for his  appetite.  He will continue to follow closely with the nutritionist they were scheduled to see him 01/04/2021. Palliative care is working with reaching out to his methadone clinic about pain management.   His LFTs are elevated today. Advised to avoid tylenol for now. We will need to monitor this closely and will recheck his CMP next week on weekly labs to ensure improvement.   The patient was advised to call immediately if he has any concerning symptoms in the interval. The patient voices understanding of current disease status and treatment options and is in agreement with the current care plan. All questions were answered. The patient knows to call the clinic with any problems, questions or concerns. We can certainly see the patient much sooner if necessary   No orders of the defined types were placed in this encounter.     Keisha Amer L Lynnzie Blackson, PA-C 01/01/21  ADDENDUM: Hematology/Oncology Attending: I had a face-to-face encounter with the patient today.  I reviewed his record, lab and recommended his care plan.  This is a pleasant 45 years old white male acknowledged with squamous cell carcinoma of the head and neck presented with right oropharyngeal, hypopharyngeal and supraglottic mass with extension to the level of the glottis as well as bilateral lymphadenopathy diagnosed in November 2022.  The patient is currently undergoing a course of concurrent chemoradiation with cisplatin 100 Mg/M2 every 3 weeks status post 1 cycle started last week. The patient presented to the clinic today for evaluation with worsening pain as well as lack of appetite and delayed nausea from the cisplatin.  He also has poor p.o. intake and lack of appetite. He was seen by the palliative care team and expected to start treatment with Marinol soon after discussion with his pain clinic as the patient is currently on treatment with methadone and he does not want to get in trouble with the pain clinic at this  point. I was also seen by the dietitian at the cancer center. We will arrange for the patient to receive IV hydration today with normal saline and Zofran 8 mg IV. Will continue to monitor him closely and manage the adverse effect of his treatment at regular basis. He will come back for follow-up visit in 2 weeks for evaluation before the next cycle of his treatment. Had a lot of question about his prognosis and we answered them to the best of our knowledge at this point. The patient was advised to call immediately if he has any other concerning symptoms in the interval. The total time spent in  the appointment was 30 minutes. Disclaimer: This note was dictated with voice recognition software. Similar sounding words can inadvertently be transcribed and may be missed upon review. Eilleen Kempf, MD 01/01/21

## 2021-01-01 ENCOUNTER — Ambulatory Visit: Payer: Medicaid Other

## 2021-01-01 ENCOUNTER — Inpatient Hospital Stay: Payer: Medicaid Other

## 2021-01-01 ENCOUNTER — Other Ambulatory Visit: Payer: Self-pay

## 2021-01-01 ENCOUNTER — Telehealth: Payer: Self-pay

## 2021-01-01 ENCOUNTER — Ambulatory Visit
Admission: RE | Admit: 2021-01-01 | Discharge: 2021-01-01 | Disposition: A | Discharge status: Home / Self Care | Payer: Medicaid Other | Source: Ambulatory Visit | Attending: Radiation Oncology | Admitting: Radiation Oncology

## 2021-01-01 ENCOUNTER — Other Ambulatory Visit: Payer: Medicaid Other

## 2021-01-01 ENCOUNTER — Other Ambulatory Visit: Payer: Self-pay | Admitting: Nurse Practitioner

## 2021-01-01 ENCOUNTER — Inpatient Hospital Stay (HOSPITAL_BASED_OUTPATIENT_CLINIC_OR_DEPARTMENT_OTHER): Payer: Medicaid Other | Admitting: Physician Assistant

## 2021-01-01 VITALS — BP 117/87 | HR 82 | Resp 17 | Wt 118.1 lb

## 2021-01-01 DIAGNOSIS — C328 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: Secondary | ICD-10-CM

## 2021-01-01 DIAGNOSIS — R530 Neoplastic (malignant) related fatigue: Secondary | ICD-10-CM

## 2021-01-01 DIAGNOSIS — T451X5A Adverse effect of antineoplastic and immunosuppressive drugs, initial encounter: Secondary | ICD-10-CM | POA: Insufficient documentation

## 2021-01-01 DIAGNOSIS — Z515 Encounter for palliative care: Secondary | ICD-10-CM

## 2021-01-01 DIAGNOSIS — R634 Abnormal weight loss: Secondary | ICD-10-CM

## 2021-01-01 DIAGNOSIS — R112 Nausea with vomiting, unspecified: Secondary | ICD-10-CM

## 2021-01-01 DIAGNOSIS — Z7189 Other specified counseling: Secondary | ICD-10-CM

## 2021-01-01 DIAGNOSIS — E876 Hypokalemia: Secondary | ICD-10-CM

## 2021-01-01 DIAGNOSIS — G893 Neoplasm related pain (acute) (chronic): Secondary | ICD-10-CM

## 2021-01-01 LAB — CMP (CANCER CENTER ONLY)
ALT: 120 U/L — ABNORMAL HIGH (ref 0–44)
AST: 93 U/L — ABNORMAL HIGH (ref 15–41)
Albumin: 3.2 g/dL — ABNORMAL LOW (ref 3.5–5.0)
Alkaline Phosphatase: 87 U/L (ref 38–126)
Anion gap: 10 (ref 5–15)
BUN: 23 mg/dL — ABNORMAL HIGH (ref 6–20)
CO2: 29 mmol/L (ref 22–32)
Calcium: 8.8 mg/dL — ABNORMAL LOW (ref 8.9–10.3)
Chloride: 96 mmol/L — ABNORMAL LOW (ref 98–111)
Creatinine: 0.84 mg/dL (ref 0.61–1.24)
GFR, Estimated: >60 mL/min (ref >60)
Glucose, Bld: 88 mg/dL (ref 70–99)
Potassium: 3.2 mmol/L — ABNORMAL LOW (ref 3.5–5.1)
Sodium: 135 mmol/L (ref 135–145)
Total Bilirubin: 0.8 mg/dL (ref 0.3–1.2)
Total Protein: 7 g/dL (ref 6.5–8.1)

## 2021-01-01 LAB — CBC WITH DIFFERENTIAL (CANCER CENTER ONLY)
Abs Immature Granulocytes: 0.05 10*3/uL (ref 0.00–0.07)
Basophils Absolute: 0 10*3/uL (ref 0.0–0.1)
Basophils Relative: 0 %
Eosinophils Absolute: 0 10*3/uL (ref 0.0–0.5)
Eosinophils Relative: 0 %
HCT: 37.7 % — ABNORMAL LOW (ref 39.0–52.0)
Hemoglobin: 13.3 g/dL (ref 13.0–17.0)
Immature Granulocytes: 1 %
Lymphocytes Relative: 7 %
Lymphs Abs: 0.5 10*3/uL — ABNORMAL LOW (ref 0.7–4.0)
MCH: 29.1 pg (ref 26.0–34.0)
MCHC: 35.3 g/dL (ref 30.0–36.0)
MCV: 82.5 fL (ref 80.0–100.0)
Monocytes Absolute: 0.3 10*3/uL (ref 0.1–1.0)
Monocytes Relative: 4 %
Neutro Abs: 6.2 10*3/uL (ref 1.7–7.7)
Neutrophils Relative %: 88 %
Platelet Count: 161 10*3/uL (ref 150–400)
RBC: 4.57 MIL/uL (ref 4.22–5.81)
RDW: 14.5 % (ref 11.5–15.5)
WBC Count: 7.1 10*3/uL (ref 4.0–10.5)
nRBC: 0 % (ref 0.0–0.2)

## 2021-01-01 LAB — HIV ANTIBODY (ROUTINE TESTING W REFLEX): HIV Screen 4th Generation wRfx: NONREACTIVE

## 2021-01-01 LAB — MAGNESIUM: Magnesium: 1.7 mg/dL (ref 1.7–2.4)

## 2021-01-01 MED ORDER — POTASSIUM CHLORIDE CRYS ER 20 MEQ PO TBCR: 20.0000 meq | EXTENDED_RELEASE_TABLET | Freq: Every day | ORAL | 0 refills | Status: DC

## 2021-01-01 MED ORDER — ONDANSETRON HCL 8 MG PO TABS: 8.0000 mg | ORAL_TABLET | Freq: Three times a day (TID) | ORAL | 2 refills | Status: DC | PRN

## 2021-01-01 MED ORDER — METHADONE HCL 10 MG/5ML PO SOLN: 20.0000 mg | Freq: Three times a day (TID) | ORAL | 0 refills | Status: DC | PRN

## 2021-01-01 NOTE — Telephone Encounter (Signed)
[name], Mr. Massmann mother, called to ask about his new prescriptions. I explained that Lexine Baton, NP was working on the authorization for the Marinol and that I would let her know when that went through. She also asked about the Methadone. I reached out to [facility] who stated that they will have to order it and it won't be in until Wednesday. I relayed this information to [facility] and told her to continue to go to [facility] for Methadone until [facility] receives it. We will call [facility] tomorrow as they are closed now to explain the switch in dispensing. Understanding verbalized. All questions answered. Advised to call with any questions/concerns.

## 2021-01-02 ENCOUNTER — Ambulatory Visit: Payer: Medicaid Other

## 2021-01-02 ENCOUNTER — Telehealth: Payer: Self-pay

## 2021-01-02 ENCOUNTER — Other Ambulatory Visit: Payer: Self-pay | Admitting: Radiation Oncology

## 2021-01-02 ENCOUNTER — Ambulatory Visit
Admission: RE | Admit: 2021-01-02 | Discharge: 2021-01-02 | Disposition: A | Discharge status: Home / Self Care | Payer: Medicaid Other | Source: Ambulatory Visit | Attending: Radiation Oncology | Admitting: Radiation Oncology

## 2021-01-02 DIAGNOSIS — C328 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: Secondary | ICD-10-CM

## 2021-01-02 MED ORDER — SONAFINE EX EMUL: 1.0000 "application " | Freq: Two times a day (BID) | CUTANEOUS | Status: DC

## 2021-01-02 MED ORDER — CLOTRIMAZOLE 10 MG MT TROC: 10.0000 mg | Freq: Every day | OROMUCOSAL | 0 refills | Status: DC

## 2021-01-02 NOTE — Telephone Encounter (Signed)
[name], Mr. Trott mother, called inquiring about his Marinol prescription. I called the pharmacy who stated that the authorization was in process but that it hadn't been approved yet. The pharmacy also said that they would still be getting the Methadone prescription tomorrow around 12 noon. They stated they spoke to [facility] yesterday about this. I also reminded [name] of their appt on 12/8 with our office. Understanding verbalized. All questions answered. I advised [name] to call back with any questions/concerns.

## 2021-01-03 ENCOUNTER — Telehealth (HOSPITAL_COMMUNITY): Payer: Self-pay | Admitting: General Practice

## 2021-01-03 ENCOUNTER — Other Ambulatory Visit: Payer: Self-pay

## 2021-01-03 ENCOUNTER — Ambulatory Visit
Admission: RE | Admit: 2021-01-03 | Discharge: 2021-01-03 | Disposition: A | Discharge status: Home / Self Care | Payer: Medicaid Other | Source: Ambulatory Visit | Attending: Radiation Oncology | Admitting: Radiation Oncology

## 2021-01-03 ENCOUNTER — Ambulatory Visit: Payer: Medicaid Other

## 2021-01-03 DIAGNOSIS — C328 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: Secondary | ICD-10-CM | POA: Diagnosis not present

## 2021-01-03 NOTE — Telephone Encounter (Signed)
[facility] CSW Progress notes  Call to mother at request of Francesco Sor, RN.  Has questions about chosing Medicaid plan.  Advised that undersigned CSW does not provide insurance counseling, referred her to the [facility] information line which can assist w understanding various options and plans.  Edwyna Shell, LCSW Clinical Social Worker Phone:  [phone]

## 2021-01-04 ENCOUNTER — Ambulatory Visit
Admission: RE | Admit: 2021-01-04 | Discharge: 2021-01-04 | Disposition: A | Discharge status: Home / Self Care | Payer: Medicaid Other | Source: Ambulatory Visit | Attending: Radiation Oncology | Admitting: Radiation Oncology

## 2021-01-04 ENCOUNTER — Ambulatory Visit: Payer: Medicaid Other

## 2021-01-04 ENCOUNTER — Inpatient Hospital Stay: Payer: Medicaid Other | Admitting: Nutrition

## 2021-01-04 ENCOUNTER — Inpatient Hospital Stay (HOSPITAL_BASED_OUTPATIENT_CLINIC_OR_DEPARTMENT_OTHER): Payer: Medicaid Other | Admitting: Nurse Practitioner

## 2021-01-04 VITALS — BP 120/86 | HR 80 | Temp 97.2°F | Resp 18 | Wt 123.1 lb

## 2021-01-04 DIAGNOSIS — Z515 Encounter for palliative care: Secondary | ICD-10-CM | POA: Diagnosis not present

## 2021-01-04 DIAGNOSIS — R634 Abnormal weight loss: Secondary | ICD-10-CM

## 2021-01-04 DIAGNOSIS — R530 Neoplastic (malignant) related fatigue: Secondary | ICD-10-CM | POA: Diagnosis not present

## 2021-01-04 DIAGNOSIS — C328 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: Secondary | ICD-10-CM | POA: Diagnosis not present

## 2021-01-04 DIAGNOSIS — G893 Neoplasm related pain (acute) (chronic): Secondary | ICD-10-CM

## 2021-01-04 DIAGNOSIS — Z7189 Other specified counseling: Secondary | ICD-10-CM | POA: Diagnosis not present

## 2021-01-04 MED ORDER — METHADONE HCL 10 MG/ML PO CONC: 60.0000 mg | Freq: Every day | ORAL | 0 refills | Status: DC

## 2021-01-04 NOTE — Progress Notes (Signed)
[place]  [phone] [phone]   Name: David [name] Date: 01/04/2021 MRN: [id number]  DOB: Apr 01, 1975  Patient Care Team: Default, Provider, MD as PCP - General   David [name] is a 45 y.o. male with multiple medical problems including stage IV squamous cell carcinoma, right supraglottic mass, adenopathy, s/p initial concurrent chemoradiation 12/25/2020), history of drug use currently followed by methadone clinic, homelessness, and some malnutrition.  Palliative ask to see for symptom management and goals of care.    SOCIAL HISTORY:     reports that he has been smoking cigarettes. He has been smoking an average of 3 packs per day. He has never used smokeless tobacco. He reports that he does not currently use alcohol after a past usage of about 12.0 standard drinks per week. He reports that he does not currently use drugs after having used the following drugs: IV.  ADVANCE DIRECTIVES:  Patient reports he does not have advanced directives.  Would like to further discuss in the future.  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[place])    Pneumothorax    Left lung spontaneous pneumothorax at age 21 yr      HEMATOLOGY/ONCOLOGY HISTORY:  Oncology History  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 ([place])  12/01/2020 Cancer Staging   Staging form: Larynx - Supraglottis, AJCC 8th Edition - Clinical stage from 12/01/2020: Stage IVB (cT3, cN3b, cM0) - Signed by [name], MD on 12/20/2020 Stage prefix: Initial diagnosis    12/04/2020 Initial Diagnosis   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 ([place])   12/25/2020 -  Chemotherapy   Patient is on Treatment Plan : HEAD/NECK Cisplatin + XRT q21d       ALLERGIES:  has No Known Allergies.  MEDICATIONS:  Current Outpatient Medications  Medication Sig Dispense Refill    amoxicillin-clavulanate (AUGMENTIN) 875-125 MG tablet Take 1 tablet by mouth 2 (two) times daily. One po bid x 7 days (Patient not taking: Reported on 01/02/2021) 14 tablet 0   clotrimazole (MYCELEX) 10 MG troche Take 1 tablet (10 mg total) by mouth 5 (five) times daily. Suck on these slowly. 70 Troche 0   dexamethasone (DECADRON) 4 MG tablet Take 1 tablet PO BID; take with food. 40 tablet 0   dronabinol (MARINOL) 2.5 MG capsule Take 1 capsule (2.5 mg total) by mouth 2 (two) times daily before a meal. 60 capsule 0   fluticasone (FLONASE) 50 MCG/ACT nasal spray Place 2 sprays into both nostrils daily. 18 mL 2   ibuprofen (ADVIL,MOTRIN) 200 MG tablet Take 600 mg by mouth every 6 (six) hours as needed for moderate pain.     lidocaine (XYLOCAINE) 2 % solution Patient: Mix 1part 2% viscous lidocaine, 1part H20. Swish & swallow 62mL of diluted mixture, 61min before meals and at bedtime, up to QID prn soreness. 200 mL 3   [START ON 01/08/2021] methadone (DOLOPHINE) 10 MG/ML solution Take 6 mLs (60 mg total) by mouth daily. 420 mL 0   omeprazole (PRILOSEC) 20 MG capsule Take 1 capsule (20 mg total) by mouth daily. 30 capsule 0   ondansetron (ZOFRAN) 8 MG tablet Take 1 tablet (8 mg total) by mouth every 8 (eight) hours as needed for nausea or vomiting. Starting day 3 after chemotherapy 30 tablet 2   potassium chloride SA (KLOR-CON M) 20 MEQ tablet Take 1 tablet (20 mEq total) by mouth daily. 5 tablet  0   prochlorperazine (COMPAZINE) 10 MG tablet Take 1 tablet (10 mg total) by mouth every 6 (six) hours as needed. 30 tablet 2   No current facility-administered medications for this visit.    VITAL SIGNS: BP 120/86 (BP Location: Left Arm, Patient Position: Sitting)   Pulse 80   Temp (!) 97.2 F (36.2 C) (Oral)   Resp 18   Wt 123 lb 1 oz (55.8 kg)   SpO2 97%   BMI 17.66 kg/m  Filed Weights   01/04/21 1549  Weight: 123 lb 1 oz (55.8 kg)    Estimated body mass index is 17.66 kg/m as calculated from the  following:   Height as of 12/13/20: 5\' 10"  (1.778 m).   Weight as of this encounter: 123 lb 1 oz (55.8 kg).  LABS: CBC:    Component Value Date/Time   WBC 7.1 01/01/2021 1423   WBC 11.1 (H) 12/08/2020 1111   HGB 13.3 01/01/2021 1423   HCT 37.7 (L) 01/01/2021 1423   PLT 161 01/01/2021 1423   MCV 82.5 01/01/2021 1423   MCV 100.9 (A) 07/27/2011 1302   NEUTROABS 6.2 01/01/2021 1423   LYMPHSABS 0.5 (L) 01/01/2021 1423   MONOABS 0.3 01/01/2021 1423   EOSABS 0.0 01/01/2021 1423   BASOSABS 0.0 01/01/2021 1423   Comprehensive Metabolic Panel:    Component Value Date/Time   NA 135 01/01/2021 1423   K 3.2 (L) 01/01/2021 1423   CL 96 (L) 01/01/2021 1423   CO2 29 01/01/2021 1423   BUN 23 (H) 01/01/2021 1423   CREATININE 0.84 01/01/2021 1423   GLUCOSE 88 01/01/2021 1423   CALCIUM 8.8 (L) 01/01/2021 1423   AST 93 (H) 01/01/2021 1423   ALT 120 (H) 01/01/2021 1423   ALKPHOS 87 01/01/2021 1423   BILITOT 0.8 01/01/2021 1423   PROT 7.0 01/01/2021 1423   ALBUMIN 3.2 (L) 01/01/2021 1423     PERFORMANCE STATUS (ECOG) : 1 - Symptomatic but completely ambulatory   Physical Exam General: NAD, thin Cardiovascular: RRR Pulmonary: clear ant fields Abdomen: soft, nontender, + bowel sounds Extremities: no edema, no joint deformities Skin: no rashes Neurological: Weakness, AAOx3  IMPRESSION:  David [name] is here today for follow-up. Continues to be concerned with his poor appetite. Is also being closely followed by the Dietician. His mother is here with him today. Shares he does get up and do some things around the house however around his chemo treatment days he is a little more fatigued.   He is living in the home with her. She expresses challenges due to his anxiousness and attitude however he is not harmful and she is appreciative she can support him throughout this process.   He reports his pain is controlled. Continues to take [facility]one [facility] at [facility] until we can obtain  authorization and pharmacy has ordered medication. Reviewed contract and requirements to continue receiving our assistance here with his pain/symptom management. Both he and his mother verbalized understanding. David [name] did provide proof of lock box for medication to ensure safety. David [name] is aware he will be due for urine drug screen in 2 weeks. We will continue to work closely with [facility] regarding his methadone.   I have completed PA for his appetite stimulant and his mother plans to pick this up. They are hoping this will offer some improvement.   Denies concerns with constipation. His nausea is well controlled.   All questions answered and support provided.   PLAN: Continue methadone 60 mg daily as prescribed. Reviewed  opioid contract with patient and his mother. Both verbalized understanding. David [name] was able to show verification of home lock box for meds once received. Patient is appropriately receiving and taking. UDS has been negative (obtained from [facility]).  Marinol for appetite stimulant Continue to follow-up with dietician for nutritional support I will plan to see him back in 2 weeks.    Patient and mother expressed understanding and was in agreement with this plan. He also understands that He can call the clinic at any time with any questions, concerns, or complaints.    Time Total: 40 min.   Visit consisted of counseling and education dealing with the complex and emotionally intense issues of symptom management and palliative care in the setting of serious and potentially life-threatening illness.Greater than 50%  of this time was spent counseling and coordinating care related to the above assessment and plan.  Signed by: Alda Lea, AGPCNP-BC Palliative Medicine Team

## 2021-01-04 NOTE — Progress Notes (Signed)
Nutrition follow-up completed with patient prior to radiation therapy for stage IV [place] cancer.  Patient is receiving concurrent chemoradiation therapy with cisplatin every 3 weeks.  Patient is being followed by Dr. Julien Nordmann and Dr. Isidore Moos.  Weight decreased and was documented as 118 pounds on December 5.  This is down from 123 pounds November 22. Noted labs: Magnesium 1.7, potassium 3.2, BUN 23, and albumin 3.2  Patient reports that he has very little taste.  He is very frustrated because it makes it difficult for him to eat.  He states food really has no taste but does describe it as tasting like cardboard.  He was able to taste a very faint taste of apple when he drank apple juice.  He really has not been using oral nutrition supplements.  He appears to tolerate most textures but is eating less secondary to taste alterations. Today he is very irritable secondary to increased pain.  Estimated nutrition needs: 2265-2500 cal, 113-128 g protein, 2.3 L fluid.  Nutrition diagnosis: Predicted suboptimal energy intake has evolved into inadequate oral intake.  Intervention: Educated patient on strategies to improve taste alterations. Recommended patient try to drink oral nutrition supplements and provided a variety of samples. Stressed importance of increasing nutrition. Encourage patient to consider feeding tube placement.  Monitoring, evaluation, goals: Patient will work to increase calories and protein to minimize further weight loss.  Next visit: To be scheduled with upcoming treatments.  **Disclaimer: This note was dictated with voice recognition software. Similar sounding words can inadvertently be transcribed and this note may contain transcription errors which may not have been corrected upon publication of note.**

## 2021-01-05 ENCOUNTER — Ambulatory Visit
Admission: RE | Admit: 2021-01-05 | Discharge: 2021-01-05 | Disposition: A | Discharge status: Home / Self Care | Payer: Medicaid Other | Source: Ambulatory Visit | Attending: Radiation Oncology | Admitting: Radiation Oncology

## 2021-01-05 ENCOUNTER — Telehealth: Payer: Self-pay

## 2021-01-05 ENCOUNTER — Ambulatory Visit: Payer: Medicaid Other

## 2021-01-05 ENCOUNTER — Encounter: Payer: Self-pay | Admitting: Internal Medicine

## 2021-01-05 ENCOUNTER — Other Ambulatory Visit: Payer: Self-pay

## 2021-01-05 DIAGNOSIS — C328 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: Secondary | ICD-10-CM | POA: Diagnosis not present

## 2021-01-05 MED ORDER — METHADONE HCL 10 MG/ML PO CONC: 60.0000 mg | Freq: Every day | ORAL | 0 refills | Status: DC

## 2021-01-05 NOTE — Telephone Encounter (Signed)
I called [name], David [name] mother, to notify her that his Marinol has been authorized and that it should be available at the pharmacy. I advised her to call the pharmacy to make sure they have ran it through. I told her to call back with any questions/concerns. Understanding verbalized. All questions answered.

## 2021-01-05 NOTE — Addendum Note (Signed)
Addended by: Jimmy Footman on: 01/05/2021 10:21 AM   Modules accepted: Orders

## 2021-01-07 ENCOUNTER — Ambulatory Visit: Payer: Medicaid Other

## 2021-01-08 ENCOUNTER — Emergency Department (HOSPITAL_COMMUNITY): Payer: Medicaid Other

## 2021-01-08 ENCOUNTER — Inpatient Hospital Stay: Payer: Medicaid Other

## 2021-01-08 ENCOUNTER — Telehealth: Payer: Self-pay

## 2021-01-08 ENCOUNTER — Other Ambulatory Visit (HOSPITAL_COMMUNITY): Payer: Medicaid Other

## 2021-01-08 ENCOUNTER — Ambulatory Visit: Payer: Medicaid Other

## 2021-01-08 ENCOUNTER — Other Ambulatory Visit: Payer: Self-pay

## 2021-01-08 ENCOUNTER — Encounter: Payer: Medicaid Other | Admitting: Physician Assistant

## 2021-01-08 ENCOUNTER — Encounter (HOSPITAL_COMMUNITY): Payer: Self-pay

## 2021-01-08 ENCOUNTER — Inpatient Hospital Stay (HOSPITAL_COMMUNITY)
Admission: EM | Admit: 2021-01-08 | Discharge: 2021-01-11 | DRG: 871 | Disposition: A | Discharge status: Home / Self Care | Payer: Medicaid Other | Source: Ambulatory Visit | Attending: Internal Medicine | Admitting: Internal Medicine

## 2021-01-08 ENCOUNTER — Other Ambulatory Visit: Payer: Self-pay | Admitting: Nurse Practitioner

## 2021-01-08 DIAGNOSIS — E861 Hypovolemia: Secondary | ICD-10-CM | POA: Diagnosis present

## 2021-01-08 DIAGNOSIS — R5383 Other fatigue: Secondary | ICD-10-CM

## 2021-01-08 DIAGNOSIS — R5081 Fever presenting with conditions classified elsewhere: Secondary | ICD-10-CM | POA: Diagnosis present

## 2021-01-08 DIAGNOSIS — C328 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: Secondary | ICD-10-CM | POA: Diagnosis not present

## 2021-01-08 DIAGNOSIS — R070 Pain in throat: Secondary | ICD-10-CM | POA: Diagnosis present

## 2021-01-08 DIAGNOSIS — F17210 Nicotine dependence, cigarettes, uncomplicated: Secondary | ICD-10-CM | POA: Diagnosis present

## 2021-01-08 DIAGNOSIS — N179 Acute kidney failure, unspecified: Secondary | ICD-10-CM | POA: Diagnosis present

## 2021-01-08 DIAGNOSIS — E43 Unspecified severe protein-calorie malnutrition: Secondary | ICD-10-CM | POA: Insufficient documentation

## 2021-01-08 DIAGNOSIS — E871 Hypo-osmolality and hyponatremia: Secondary | ICD-10-CM | POA: Diagnosis present

## 2021-01-08 DIAGNOSIS — Z681 Body mass index (BMI) 19 or less, adult: Secondary | ICD-10-CM

## 2021-01-08 DIAGNOSIS — F1110 Opioid abuse, uncomplicated: Secondary | ICD-10-CM | POA: Diagnosis present

## 2021-01-08 DIAGNOSIS — R7989 Other specified abnormal findings of blood chemistry: Secondary | ICD-10-CM | POA: Diagnosis present

## 2021-01-08 DIAGNOSIS — E860 Dehydration: Secondary | ICD-10-CM | POA: Diagnosis present

## 2021-01-08 DIAGNOSIS — C329 Malignant neoplasm of larynx, unspecified: Secondary | ICD-10-CM | POA: Diagnosis present

## 2021-01-08 DIAGNOSIS — Z5900 Homelessness unspecified: Secondary | ICD-10-CM

## 2021-01-08 DIAGNOSIS — Z79899 Other long term (current) drug therapy: Secondary | ICD-10-CM

## 2021-01-08 DIAGNOSIS — Z7952 Long term (current) use of systemic steroids: Secondary | ICD-10-CM

## 2021-01-08 DIAGNOSIS — T451X5A Adverse effect of antineoplastic and immunosuppressive drugs, initial encounter: Secondary | ICD-10-CM | POA: Diagnosis present

## 2021-01-08 DIAGNOSIS — B950 Streptococcus, group A, as the cause of diseases classified elsewhere: Secondary | ICD-10-CM | POA: Diagnosis present

## 2021-01-08 DIAGNOSIS — D701 Agranulocytosis secondary to cancer chemotherapy: Secondary | ICD-10-CM | POA: Diagnosis not present

## 2021-01-08 DIAGNOSIS — R6510 Systemic inflammatory response syndrome (SIRS) of non-infectious origin without acute organ dysfunction: Secondary | ICD-10-CM | POA: Diagnosis not present

## 2021-01-08 DIAGNOSIS — D61818 Other pancytopenia: Secondary | ICD-10-CM | POA: Diagnosis present

## 2021-01-08 DIAGNOSIS — Z20822 Contact with and (suspected) exposure to covid-19: Secondary | ICD-10-CM | POA: Diagnosis present

## 2021-01-08 DIAGNOSIS — A419 Sepsis, unspecified organism: Principal | ICD-10-CM | POA: Diagnosis present

## 2021-01-08 DIAGNOSIS — D709 Neutropenia, unspecified: Secondary | ICD-10-CM | POA: Diagnosis present

## 2021-01-08 DIAGNOSIS — E876 Hypokalemia: Secondary | ICD-10-CM | POA: Diagnosis present

## 2021-01-08 DIAGNOSIS — I959 Hypotension, unspecified: Secondary | ICD-10-CM | POA: Diagnosis present

## 2021-01-08 HISTORY — DX: Acute kidney failure, unspecified: N17.9

## 2021-01-08 HISTORY — DX: 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: C32.8

## 2021-01-08 LAB — COMPREHENSIVE METABOLIC PANEL
ALT: 42 U/L (ref 0–44)
AST: 36 U/L (ref 15–41)
Albumin: 2.8 g/dL — ABNORMAL LOW (ref 3.5–5.0)
Alkaline Phosphatase: 80 U/L (ref 38–126)
Anion gap: 11 (ref 5–15)
BUN: 21 mg/dL — ABNORMAL HIGH (ref 6–20)
CO2: 26 mmol/L (ref 22–32)
Calcium: 8.4 mg/dL — ABNORMAL LOW (ref 8.9–10.3)
Chloride: 91 mmol/L — ABNORMAL LOW (ref 98–111)
Creatinine, Ser: 1.32 mg/dL — ABNORMAL HIGH (ref 0.61–1.24)
GFR, Estimated: >60 mL/min (ref >60)
Glucose, Bld: 91 mg/dL (ref 70–99)
Potassium: 3.5 mmol/L (ref 3.5–5.1)
Sodium: 128 mmol/L — ABNORMAL LOW (ref 135–145)
Total Bilirubin: 1.1 mg/dL (ref 0.3–1.2)
Total Protein: 6.1 g/dL — ABNORMAL LOW (ref 6.5–8.1)

## 2021-01-08 LAB — CBC WITH DIFFERENTIAL/PLATELET
Abs Immature Granulocytes: 0.01 10*3/uL (ref 0.00–0.07)
Basophils Absolute: 0 10*3/uL (ref 0.0–0.1)
Basophils Relative: 1 %
Eosinophils Absolute: 0 10*3/uL (ref 0.0–0.5)
Eosinophils Relative: 0 %
HCT: 31.8 % — ABNORMAL LOW (ref 39.0–52.0)
Hemoglobin: 10.7 g/dL — ABNORMAL LOW (ref 13.0–17.0)
Immature Granulocytes: 1 %
Lymphocytes Relative: 9 %
Lymphs Abs: 0.2 10*3/uL — ABNORMAL LOW (ref 0.7–4.0)
MCH: 29.2 pg (ref 26.0–34.0)
MCHC: 33.6 g/dL (ref 30.0–36.0)
MCV: 86.6 fL (ref 80.0–100.0)
Monocytes Absolute: 0.6 10*3/uL (ref 0.1–1.0)
Monocytes Relative: 34 %
Neutro Abs: 1 10*3/uL — ABNORMAL LOW (ref 1.7–7.7)
Neutrophils Relative %: 55 %
Platelets: 124 10*3/uL — ABNORMAL LOW (ref 150–400)
RBC: 3.67 MIL/uL — ABNORMAL LOW (ref 4.22–5.81)
RDW: 16.1 % — ABNORMAL HIGH (ref 11.5–15.5)
WBC Morphology: INCREASED
WBC: 1.8 10*3/uL — ABNORMAL LOW (ref 4.0–10.5)
nRBC: 0 % (ref 0.0–0.2)

## 2021-01-08 LAB — LACTIC ACID, PLASMA
Lactic Acid, Venous: 2.4 mmol/L (ref 0.5–1.9)
Lactic Acid, Venous: 4.4 mmol/L (ref 0.5–1.9)

## 2021-01-08 LAB — URINALYSIS, ROUTINE W REFLEX MICROSCOPIC
Bilirubin Urine: NEGATIVE
Glucose, UA: NEGATIVE mg/dL
Hgb urine dipstick: NEGATIVE
Ketones, ur: NEGATIVE mg/dL
Leukocytes,Ua: NEGATIVE
Nitrite: NEGATIVE
Protein, ur: NEGATIVE mg/dL
Specific Gravity, Urine: 1.01 (ref 1.005–1.030)
pH: 7 (ref 5.0–8.0)

## 2021-01-08 LAB — RESP PANEL BY RT-PCR (FLU A&B, COVID) ARPGX2
Influenza A by PCR: NEGATIVE
Influenza B by PCR: NEGATIVE
SARS Coronavirus 2 by RT PCR: NEGATIVE

## 2021-01-08 LAB — CBG MONITORING, ED: Glucose-Capillary: 98 mg/dL (ref 70–99)

## 2021-01-08 MED ORDER — PANTOPRAZOLE SODIUM 40 MG PO TBEC
40.0000 mg | DELAYED_RELEASE_TABLET | Freq: Every day | ORAL | Status: DC
  Administered 2021-01-09 – 2021-01-11 (×3): 40 mg via ORAL
  Filled 2021-01-08 (×3): qty 1

## 2021-01-08 MED ORDER — KETAMINE HCL 50 MG/5ML IJ SOSY
0.3000 mg/kg | PREFILLED_SYRINGE | Freq: Once | INTRAMUSCULAR | Status: AC
  Administered 2021-01-08: 16 mg via INTRAVENOUS
  Filled 2021-01-08: qty 5

## 2021-01-08 MED ORDER — ENOXAPARIN SODIUM 40 MG/0.4ML IJ SOSY
40.0000 mg | PREFILLED_SYRINGE | INTRAMUSCULAR | Status: DC
  Administered 2021-01-08: 40 mg via SUBCUTANEOUS
  Filled 2021-01-08: qty 0.4

## 2021-01-08 MED ORDER — METRONIDAZOLE 500 MG/100ML IV SOLN
500.0000 mg | Freq: Once | INTRAVENOUS | Status: AC
  Administered 2021-01-08: 500 mg via INTRAVENOUS
  Filled 2021-01-08: qty 100

## 2021-01-08 MED ORDER — ONDANSETRON HCL 4 MG PO TABS: 4.0000 mg | ORAL_TABLET | Freq: Four times a day (QID) | ORAL | Status: DC | PRN

## 2021-01-08 MED ORDER — LACTATED RINGERS IV BOLUS
1000.0000 mL | Freq: Once | INTRAVENOUS | Status: AC
  Administered 2021-01-08: 1000 mL via INTRAVENOUS

## 2021-01-08 MED ORDER — DRONABINOL 2.5 MG PO CAPS
2.5000 mg | ORAL_CAPSULE | Freq: Two times a day (BID) | ORAL | Status: DC
  Administered 2021-01-09 – 2021-01-11 (×6): 2.5 mg via ORAL
  Filled 2021-01-08 (×6): qty 1

## 2021-01-08 MED ORDER — METRONIDAZOLE 500 MG/100ML IV SOLN
500.0000 mg | Freq: Two times a day (BID) | INTRAVENOUS | Status: DC
  Administered 2021-01-09 (×2): 500 mg via INTRAVENOUS
  Filled 2021-01-08 (×2): qty 100

## 2021-01-08 MED ORDER — VANCOMYCIN HCL IN DEXTROSE 1-5 GM/200ML-% IV SOLN
1000.0000 mg | Freq: Once | INTRAVENOUS | Status: AC
  Administered 2021-01-08: 1000 mg via INTRAVENOUS
  Filled 2021-01-08: qty 200

## 2021-01-08 MED ORDER — LIDOCAINE VISCOUS HCL 2 % MT SOLN
15.0000 mL | Freq: Four times a day (QID) | OROMUCOSAL | Status: DC | PRN
  Administered 2021-01-08 – 2021-01-10 (×2): 15 mL via OROMUCOSAL
  Filled 2021-01-08 (×3): qty 15

## 2021-01-08 MED ORDER — METHADONE HCL 10 MG/ML PO CONC
40.0000 mg | Freq: Once | ORAL | Status: AC
  Administered 2021-01-08: 40 mg via ORAL
  Filled 2021-01-08: qty 5

## 2021-01-08 MED ORDER — SODIUM CHLORIDE 0.9 % IV SOLN: INTRAVENOUS | Status: AC

## 2021-01-08 MED ORDER — SODIUM CHLORIDE 0.9 % IV SOLN
2.0000 g | Freq: Once | INTRAVENOUS | Status: AC
  Administered 2021-01-08: 2 g via INTRAVENOUS
  Filled 2021-01-08: qty 2

## 2021-01-08 MED ORDER — SENNOSIDES-DOCUSATE SODIUM 8.6-50 MG PO TABS: 1.0000 | ORAL_TABLET | Freq: Every evening | ORAL | Status: DC | PRN

## 2021-01-08 MED ORDER — ONDANSETRON HCL 4 MG/2ML IJ SOLN: 4.0000 mg | Freq: Four times a day (QID) | INTRAMUSCULAR | Status: DC | PRN

## 2021-01-08 MED ORDER — ACETAMINOPHEN 325 MG PO TABS
650.0000 mg | ORAL_TABLET | Freq: Four times a day (QID) | ORAL | Status: DC | PRN
  Administered 2021-01-09 – 2021-01-11 (×4): 650 mg via ORAL
  Filled 2021-01-08 (×4): qty 2

## 2021-01-08 MED ORDER — METHADONE HCL 5 MG PO TABS
20.0000 mg | ORAL_TABLET | Freq: Once | ORAL | Status: AC
  Administered 2021-01-08: 20 mg via ORAL
  Filled 2021-01-08: qty 4

## 2021-01-08 MED ORDER — HYDROMORPHONE HCL 1 MG/ML IJ SOLN
1.0000 mg | Freq: Once | INTRAMUSCULAR | Status: AC
  Administered 2021-01-08: 1 mg via INTRAVENOUS
  Filled 2021-01-08: qty 1

## 2021-01-08 MED ORDER — VANCOMYCIN HCL IN DEXTROSE 1-5 GM/200ML-% IV SOLN
1000.0000 mg | INTRAVENOUS | Status: DC
Start: 2021-01-09 — End: 2021-01-09

## 2021-01-08 MED ORDER — METOCLOPRAMIDE HCL 5 MG/ML IJ SOLN
10.0000 mg | Freq: Once | INTRAMUSCULAR | Status: AC
  Administered 2021-01-08: 10 mg via INTRAVENOUS
  Filled 2021-01-08: qty 2

## 2021-01-08 MED ORDER — SODIUM CHLORIDE 0.9 % IV BOLUS
1000.0000 mL | Freq: Once | INTRAVENOUS | Status: AC
  Administered 2021-01-08: 1000 mL via INTRAVENOUS

## 2021-01-08 MED ORDER — LACTATED RINGERS IV BOLUS: 500.0000 mL | Freq: Once | INTRAVENOUS | Status: DC

## 2021-01-08 MED ORDER — CLOTRIMAZOLE 10 MG MT TROC
10.0000 mg | Freq: Every day | OROMUCOSAL | Status: DC
  Administered 2021-01-08 – 2021-01-11 (×11): 10 mg via ORAL
  Filled 2021-01-08 (×17): qty 1

## 2021-01-08 MED ORDER — METHADONE HCL 10 MG/ML PO CONC
60.0000 mg | Freq: Every day | ORAL | Status: DC
  Administered 2021-01-09 – 2021-01-11 (×3): 60 mg via ORAL
  Filled 2021-01-08 (×3): qty 10

## 2021-01-08 MED ORDER — ACETAMINOPHEN 650 MG RE SUPP: 650.0000 mg | Freq: Four times a day (QID) | RECTAL | Status: DC | PRN

## 2021-01-08 MED ORDER — SODIUM CHLORIDE 0.9 % IV SOLN
2.0000 g | Freq: Two times a day (BID) | INTRAVENOUS | Status: DC
  Administered 2021-01-09: 2 g via INTRAVENOUS
  Filled 2021-01-08: qty 2

## 2021-01-08 MED ORDER — CHLORHEXIDINE GLUCONATE CLOTH 2 % EX PADS
6.0000 | MEDICATED_PAD | Freq: Every day | CUTANEOUS | Status: DC
  Administered 2021-01-08: 6 via TOPICAL

## 2021-01-08 MED ORDER — LACTATED RINGERS IV SOLN: INTRAVENOUS | Status: DC

## 2021-01-08 MED ORDER — DEXAMETHASONE 4 MG PO TABS
4.0000 mg | ORAL_TABLET | Freq: Two times a day (BID) | ORAL | Status: DC
  Administered 2021-01-09 – 2021-01-11 (×6): 4 mg via ORAL
  Filled 2021-01-08 (×7): qty 1

## 2021-01-08 MED ORDER — NICOTINE 21 MG/24HR TD PT24
21.0000 mg | MEDICATED_PATCH | Freq: Every day | TRANSDERMAL | Status: DC
  Administered 2021-01-08 – 2021-01-11 (×4): 21 mg via TRANSDERMAL
  Filled 2021-01-08 (×4): qty 1

## 2021-01-08 NOTE — Telephone Encounter (Signed)
[name], Mr. Pollio mother, called this morning to report that David [name] had a fever of 102 this morning and that she just rechecked it and it was down to 101.6. She stated she also called Dr. Lew Dawes office but had not heard anything back and was inquiring about his radiation appt today. She said that David [name] also stated that he "didn't want to do this anymore". I notified Ansyi, Cassie's CMA and Lexine Baton, NP. They stated they would complete further work up of the situation.

## 2021-01-08 NOTE — H&P (Signed)
History and Physical    Jeptha Hinnenkamp PJA:[id number] DOB: 03-18-1975 DOA: 01/08/2021  PCP: Default, Provider, MD  Patient coming from: [place]  I have personally briefly reviewed patient's old medical records in [place]  Chief Complaint: Fever, weakness, hypotension  HPI: Geovanni Rahming is a 45 y.o. male with medical history significant for stage IV squamous cell laryngeal cancer on active chemotherapy and radiation, substance use disorder on methadone who presented to the ED for evaluation of fevers at home found to be hypotensive in the cancer center.  Patient with stage IV squamous cell laryngeal cancer.  He is on active chemotherapy with cisplatin, last infusion 12/17/2020.  Also receiving radiation treatment, last on 12/9.  Patient states he is continuing to abuse opioids.  He says he injected heroin in his right arm and methadone in his left arm earlier today.  He had a fever at home, initially 102 F and 101.6 F on recheck per prior documentation.  He went for his radiation treatment today but was found to be hypotensive with BP 60s/30s per ED triage documentation.  He was subsequently sent to the ED for further evaluation.  Patient states that he has had poor oral intake today but otherwise states that he is eating/drinking well prior to today without any dysphagia.  He denies any nausea, vomiting, abdominal pain, dysuria, chest pain.  ED Course:  Initial vitals showed BP 70/54, pulse 116, RR 14, temp 98.6 F, SPO2 100% on room air.  Patient hypotensive in the ED with BP as low as 55/31 and MAP 39 and tachypneic with RR up to 26.  Labs show WBC 1.8, hemoglobin 10.7, platelets 124,000, differential pending, sodium 128, potassium 3.5, bicarb 26, BUN 21, creatinine 1.32, serum glucose 91, LFTs within normal limits, lactic acid 2.4.  SARS-CoV-2 and influenza PCR negative.  Blood cultures in process.  Portable chest x-ray negative for focal consolidation,  edema, effusion.  Patient was given 2 L LR followed by maintenance fluids, IV vancomycin, cefepime, Flagyl.  Blood pressure improved with IV fluids.  He received his home oral methadone as well as IV Dilaudid 1 mg x 2 and ketamine.  The hospitalist service was consulted to admit for further evaluation and management.  Review of Systems: All systems reviewed and are negative except as documented in history of present illness above.   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 He has been smoking an average of 3 packs per day. He has never used smokeless tobacco. He reports that he does not currently use alcohol after a past usage of about 12.0 standard drinks per week. He reports that he does not currently use drugs after having used the following drugs: IV.  No Known Allergies  History reviewed. No pertinent family history.   Prior to Admission medications   Medication Sig Start Date End Date Taking? Authorizing Provider  amoxicillin-clavulanate (AUGMENTIN) 875-125 MG tablet Take 1 tablet by mouth 2 (two) times daily. One po bid x 7 days Patient not taking: Reported on 01/02/2021 11/08/20   Deno Etienne, DO  clotrimazole [facility]) 10 MG troche Take 1 tablet (10 mg total) by mouth 5 (five) times daily. Suck on these slowly. 01/02/21   Isidore Moos,  Judson Roch, MD  dexamethasone (DECADRON) 4 MG tablet Take 1 tablet PO BID; take with food. 12/01/20   [name], MD  fluticasone (FLONASE) 50 MCG/ACT nasal spray Place 2 sprays into both nostrils daily. 10/20/20 12/05/20  Lynden Oxford Scales, PA-C  ibuprofen (ADVIL,MOTRIN) 200 MG tablet Take 600 mg by mouth every 6 (six) hours as needed for moderate pain.    [provider]  lidocaine (XYLOCAINE) 2 % solution Patient:  Mix 1part 2% viscous lidocaine, 1part H20. Swish & swallow 43mL of diluted mixture, 68min before meals and at bedtime, up to QID prn soreness. 12/01/20   [name], MD  methadone (DOLOPHINE) 10 MG/ML solution Take 6 mLs (60 mg total) by mouth daily. 01/08/21   Pickenpack-Cousar, Carlena Sax, NP  omeprazole (PRILOSEC) 20 MG capsule Take 1 capsule (20 mg total) by mouth daily. 12/01/20   [name], MD  ondansetron (ZOFRAN) 8 MG tablet Take 1 tablet (8 mg total) by mouth every 8 (eight) hours as needed for nausea or vomiting. Starting day 3 after chemotherapy 01/01/21   Heilingoetter, Cassandra L, PA-C  potassium chloride SA (KLOR-CON M) 20 MEQ tablet Take 1 tablet (20 mEq total) by mouth daily. 01/01/21   Heilingoetter, Cassandra L, PA-C  prochlorperazine (COMPAZINE) 10 MG tablet Take 1 tablet (10 mg total) by mouth every 6 (six) hours as needed. 12/19/20   Heilingoetter, Cassandra Carlean Jews, PA-C    Physical Exam: Vitals:   01/08/21 1730 01/08/21 1800 01/08/21 1815 01/08/21 1915  BP: 95/84 102/60 114/60 122/61  Pulse: 99 (!) 106 (!) 104 99  Resp: 15 (!) 23 (!) 22 17  Temp:   98.5 F (36.9 C)   TempSrc:   Oral   SpO2: 100% 100% 98% 100%  Weight:      Height:       Constitutional: Resting supine in bed, NAD, calm, comfortable Eyes: PERRL, lids and conjunctivae normal ENMT: Mucous membranes are dry. Posterior pharynx clear of any exudate or lesions.Normal dentition.  Neck: normal, supple, no masses. Respiratory: Upper airway crackles with wet cough otherwise clear to auscultation.  Normal respiratory effort. No accessory muscle use.  Cardiovascular: Regular rate and rhythm, no murmurs / rubs / gallops. No extremity edema. 2+ pedal pulses. Abdomen: no tenderness, no masses palpated. No hepatosplenomegaly. Bowel sounds positive.  Musculoskeletal: no clubbing / cyanosis. No joint deformity upper and lower extremities. Good ROM, no contractures. Normal muscle tone.  Skin: Track marks bilateral  antecubital areas with scabbed over wound, no active drainage, burn marks anterior neck from radiation. Neurologic: CN 2-12 grossly intact. Sensation intact. Strength 5/5 in all 4.  Psychiatric: Alert and oriented x 3. Normal mood.   Labs on Admission: I have personally reviewed following labs and imaging studies  CBC: Recent Labs  Lab 01/08/21 1329  WBC 1.8*  NEUTROABS 1.0*  HGB 10.7*  HCT 31.8*  MCV 86.6  PLT 161*   Basic Metabolic Panel: Recent Labs  Lab 01/08/21 1329  NA 128*  K 3.5  CL 91*  CO2 26  GLUCOSE 91  BUN 21*  CREATININE 1.32*  CALCIUM 8.4*   GFR: Estimated Creatinine Clearance: 52.9 mL/min (A) (by C-G formula based on SCr of 1.32 mg/dL (H)). Liver Function Tests: Recent Labs  Lab 01/08/21 1329  AST 36  ALT 42  ALKPHOS 80  BILITOT 1.1  PROT 6.1*  ALBUMIN 2.8*   No results for input(s): LIPASE, AMYLASE in the last 168 hours. No results for input(s): AMMONIA in the last 168 hours.  Coagulation Profile: No results for input(s): INR, PROTIME in the last 168 hours. Cardiac Enzymes: No results for input(s): CKTOTAL, CKMB, CKMBINDEX, TROPONINI in the last 168 hours. BNP (last 3 results) No results for input(s): PROBNP in the last 8760 hours. HbA1C: No results for input(s): HGBA1C in the last 72 hours. CBG: Recent Labs  Lab 01/08/21 1321  GLUCAP 98   Lipid Profile: No results for input(s): CHOL, HDL, LDLCALC, TRIG, CHOLHDL, LDLDIRECT in the last 72 hours. Thyroid Function Tests: No results for input(s): TSH, T4TOTAL, FREET4, T3FREE, THYROIDAB in the last 72 hours. Anemia Panel: No results for input(s): VITAMINB12, FOLATE, FERRITIN, TIBC, IRON, RETICCTPCT in the last 72 hours. Urine analysis: No results found for: COLORURINE, APPEARANCEUR, LABSPEC, [place] , GLUCOSEU, [place], BILIRUBINUR, [place], PROTEINUR, [place], NITRITE, LEUKOCYTESUR  Radiological Exams on Admission: DG Chest Port 1 View  Result Date: 01/08/2021 CLINICAL DATA:   Hypotension EXAM: PORTABLE CHEST 1 VIEW COMPARISON:  02/18/2019 FINDINGS: The heart size and mediastinal contours are within normal limits. Both lungs are clear. The visualized skeletal structures are unremarkable. IMPRESSION: No acute abnormality of the lungs in AP portable projection. Electronically Signed   By: Delanna Ahmadi M.D.   On: 01/08/2021 14:04    EKG: Personally reviewed. Sinus tachycardia without acute ischemic changes  Assessment/Plan Principal Problem:   SIRS (systemic inflammatory response syndrome) (HCC) Active Problems:   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)   Hypotension   AKI (acute kidney injury) ([place])   Hyponatremia   Leukopenia due to antineoplastic chemotherapy (HCC)   Jovanie Verge is a 45 y.o. male with medical history significant for stage IV squamous cell laryngeal cancer on active chemotherapy and radiation, substance use disorder on methadone who presented with SIRS/Hypotension and leukopenia.  SIRS/Hypotension: Patient presenting with significant hypotension, tachycardia, tachypnea, elevated lactic acid.  Most likely due to profound dehydration however given new leukopenia (with probable neutropenia although differential pending) and reported fevers at home we will continue broad-spectrum antibiotics in setting of presumed febrile neutropenia and active injection drug use.  CXR negative for evidence of pneumonia. -Continue IV fluid resuscitation with 1 L normal saline bolus followed by maintenance fluids overnight -Continue empiric IV vancomycin, cefepime, Flagyl -Follow blood cultures -COVID and influenza negative -Check urinalysis  Leukopenia: WBC 1.8 on admission, differential pending.  Suspect chemotherapy associated.  Continue empiric antibiotics as above for presumed febrile neutropenia pending blood culture.  Acute kidney injury: Creatinine 1.32 on admission compared to prior of 0.84 on 01/01/2021.  Secondary to dehydration.   Continue IV fluids and repeat labs in AM.  Hyponatremia: Hypokalemia hyponatremia.  Continue IV fluids and recheck labs in a.m.  Stage IV Laryngeal Cancer: Follows with oncology, Dr. Julien Nordmann, on active chemotherapy with cisplatin, last infusion 12/25/2020.  Also undergoing active radiation treatment by Rad/Onc, Dr. Isidore Moos.  Currently protecting airway and speaking clearly.  Denies any dysphagia. -Continue home Decadron 4 mg twice daily with meals, viscous lidocaine solution q6h prn.  Substance use disorder: Patient admits to ongoing injection drug abuse with heroin and methadone.  Continue to monitor for opiate withdrawal.  Resume home methadone 60 mg daily.  Tobacco use: Patient reports smoking 1 pack/day, nicotine patch provided.  DVT prophylaxis: Lovenox Code Status: Full code, confirmed with patient on admission Family Communication: Discussed with patient, he has discussed with family Disposition Plan: From home, dispo pending clinical progress Consults called: None Level of care: Stepdown Admission status:  Status is: Observation  The patient remains OBS appropriate and will d/c  before 2 midnights.  Zada Finders MD [facility]  If 7PM-7AM, please contact night-coverage [email or web address]  01/08/2021, 7:50 PM

## 2021-01-08 NOTE — ED Provider Notes (Signed)
[facility] DEPT Provider Note   CSN: [id number] Arrival date & time: 01/08/21  1308     History Chief Complaint  Patient presents with   Hypotension   Weakness    David [name] is a 45 y.o. male.  Patient is a 45 year old male with a history of stage IV squamous cell carcinoma, right supraglottic mass, adenopathy, s/p initial concurrent chemoradiation 12/25/2020), history of drug use currently followed by methadone clinic but still currently using heroin on a regular basis and last used this morning, homelessness, and some malnutrition who went to radiation today but due to hypotension and fever was sent here.  Patient's mother called the office today reporting he had a temperature of 102.  Patient reports for the last 24 hours he has been feeling poorly.  He had a fever at home but is complaining of pain all over, chills, poor oral intake today but reported yesterday he was eating normally.  He did use heroin today and reports he last used his methadone yesterday but did not have any to take today.  He has had some vomiting and diarrhea and thinks he is withdrawing from narcotics.  He does not drink any alcohol.  He reports currently he started radiation 2 weeks ago but has received chemotherapy 1 time 2 weeks ago as wellt.  He does report an ongoing cough with productive sputum but reports that has been present since he stopped smoking several weeks ago.  His pain is 10 out of 10 in all over.  The history is provided by the patient, medical records and a parent.  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 No pertinent family history.  Social History   Tobacco Use   Smoking status: Every Day    Packs/day: 3.00    Types: Cigarettes   Smokeless tobacco: Never  Vaping Use   Vaping Use: Never used  Substance Use Topics   Alcohol use: Not Currently    Alcohol/week: 12.0 standard drinks    Types: 12 Cans of beer per week   Drug use: Not Currently    Types: IV    Comment: 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)    Home Medications Prior to Admission medications   Medication Sig Start Date End Date Taking? Authorizing Provider  amoxicillin-clavulanate (AUGMENTIN) 875-125 MG tablet Take 1 tablet by mouth 2 (two) times daily. One po bid x 7 days Patient not taking: Reported on 01/02/2021 11/08/20   Deno Etienne, DO  clotrimazole [facility]) 10 MG troche Take 1 tablet (10 mg total) by mouth 5 (five) times daily. Suck on these slowly. 01/02/21   [name], MD  dexamethasone (DECADRON) 4 MG tablet Take 1 tablet PO BID; take with food. 12/01/20   [name], MD  dronabinol (MARINOL) 2.5 MG capsule Take 1 capsule (2.5 mg total) by mouth 2 (two) times daily before a meal. 12/28/20   Pickenpack-Cousar, Carlena Sax, NP  fluticasone (FLONASE) 50 MCG/ACT nasal spray Place 2 sprays into both nostrils daily. 10/20/20 12/05/20  Lynden Oxford Scales, PA-C  ibuprofen (ADVIL,MOTRIN) 200 MG tablet Take 600 mg by mouth every 6 (six) hours as needed for moderate pain.    [provider]  lidocaine (XYLOCAINE) 2 % solution Patient: Mix 1part 2% viscous  lidocaine, 1part H20. Swish & swallow 64mL of diluted mixture, 9min before meals and at bedtime, up to QID prn soreness. 12/01/20   [name], MD  methadone (DOLOPHINE) 10 MG/ML solution Take 6 mLs (60 mg total) by mouth daily. 01/08/21   Pickenpack-Cousar, Carlena Sax, NP  omeprazole (PRILOSEC) 20 MG capsule Take 1 capsule (20 mg total) by mouth daily. 12/01/20   [name], MD  ondansetron (ZOFRAN) 8 MG tablet Take 1 tablet (8 mg total) by mouth every 8 (eight) hours as needed for nausea or vomiting. Starting day 3 after chemotherapy 01/01/21   Heilingoetter, Cassandra L, PA-C  potassium chloride SA (KLOR-CON M) 20 MEQ tablet Take 1 tablet (20 mEq total) by mouth daily. 01/01/21   Heilingoetter, Cassandra L, PA-C  prochlorperazine (COMPAZINE) 10 MG tablet Take 1 tablet (10 mg total) by mouth every 6 (six) hours as needed. 12/19/20   Heilingoetter, Cassandra L, PA-C    Allergies    Patient has no known allergies.  Review of Systems   Review of Systems  Neurological:  Positive for weakness.  All other systems reviewed and are negative.  Physical Exam Updated Vital Signs BP (!) 63/51   Pulse (!) 108   Temp 98.6 F (37 C) (Oral)   Resp 17   Ht 5\' 11"  (1.803 m)   Wt 53.5 kg   SpO2 99%   BMI 16.46 kg/m   Physical Exam Vitals and nursing note reviewed.  Constitutional:      General: He is not in acute distress.    Appearance: He is well-developed. He is ill-appearing.  HENT:     Head: Normocephalic and atraumatic.     Nose: Nose normal.     Mouth/Throat:     Mouth: Mucous membranes are dry.     Comments: Hoarse voice and dry mouth.  Radiation burns noted to the skin on the neck.  Mass noted in the right anterior upper neck that is non-tender and not mobile Eyes:     Conjunctiva/sclera: Conjunctivae normal.     Pupils: Pupils are equal, round, and reactive to light.  Cardiovascular:     Rate and Rhythm: Regular rhythm. Tachycardia present.     Heart sounds: No murmur  heard. Pulmonary:     Effort: Pulmonary effort is normal. No respiratory distress.     Breath sounds: Normal breath sounds. No wheezing or rales.  Abdominal:     General: There is no distension.     Palpations: Abdomen is soft.     Tenderness: There is no abdominal tenderness. There is no guarding or rebound.  Musculoskeletal:        General: No tenderness. Normal range of motion.     Cervical back: Normal range of motion and neck supple.     Comments: track marks noted on forearms bilaterally but no erythema or swelling  Skin:  General: Skin is warm and dry.     Findings: No erythema or rash.  Neurological:     Mental Status: He is alert and oriented to person, place, and time. Mental status is at baseline.  Psychiatric:        Behavior: Behavior normal.    ED Results / Procedures / Treatments   Labs (all labs ordered are listed, but only abnormal results are displayed) Labs Reviewed  CBC WITH DIFFERENTIAL/PLATELET - Abnormal; Notable for the following components:      Result Value   WBC 1.8 (*)    RBC 3.67 (*)    Hemoglobin 10.7 (*)    HCT 31.8 (*)    RDW 16.1 (*)    Platelets 124 (*)    All other components within normal limits  COMPREHENSIVE METABOLIC PANEL - Abnormal; Notable for the following components:   Sodium 128 (*)    Chloride 91 (*)    BUN 21 (*)    Creatinine, Ser 1.32 (*)    Calcium 8.4 (*)    Total Protein 6.1 (*)    Albumin 2.8 (*)    All other components within normal limits  CULTURE, BLOOD (ROUTINE X 2)  CULTURE, BLOOD (ROUTINE X 2)  RESP PANEL BY RT-PCR (FLU A&B, COVID) ARPGX2  LACTIC ACID, PLASMA  CBG MONITORING, ED    EKG EKG Interpretation  Date/Time:  Monday January 08 2021 13:30:38 EST Ventricular Rate:  105 PR Interval:  117 QRS Duration: 91 QT Interval:  329 QTC Calculation: 435 R Axis:   81 Text Interpretation: Sinus tachycardia No significant change since last tracing Confirmed by Blanchie Dessert [phone]) on 01/08/2021  1:42:43 PM  Radiology No results found.  Procedures Procedures   Medications Ordered in ED Medications  metoCLOPramide (REGLAN) injection 10 mg (10 mg Intravenous Given 01/08/21 1337)  lactated ringers bolus 1,000 mL (1,000 mLs Intravenous New Bag/Given 01/08/21 1339)  HYDROmorphone (DILAUDID) injection 1 mg (1 mg Intravenous Given 01/08/21 1337)    ED Course  I have reviewed the triage vital signs and the nursing notes.  Pertinent labs & imaging results that were available during my care of the patient were reviewed by me and considered in my medical decision making (see chart for details).    MDM Rules/Calculators/A&P                          Patient is a 45 year old male with a history of neck cancer but also a history of IV drug abuse who is currently on methadone and presenting today with fever, nausea vomiting, body aches and hypotension. Patient's mother arrives and now reports that he had a fever up to 102 today and he did receive chemotherapy 2 weeks ago.  Patient is afebrile here however given mom's history he was made a code sepsis.  He was covered broadly with antibiotics.  Patient remains with blood pressures in the 70s however tachycardia is improving and unclear how accurate this is as he has had multiple surgeries to hurt his arms he is sitting up talking and has no evidence of altered mental status.  We will continue IV fluids.  Labs today with a CBC with a leukopenia with a white count of 1.8 differential is still pending.    Final Clinical Impression(s) / ED Diagnoses Final diagnoses:  None    Rx / DC Orders ED Discharge Orders     None        Brinson Tozzi, [place],  MD 01/08/21 1551

## 2021-01-08 NOTE — Progress Notes (Signed)
10mg  (17mL) methadone wasted into narcotic waste bucket in Med room with Barnet Glasgow RN as a witness.

## 2021-01-08 NOTE — Progress Notes (Signed)
Pharmacy Antibiotic Note  David [name] is a 45 y.o. male admitted on 01/08/2021 with sepsis.  Pharmacy has been consulted for vanc/cefepime dosing.  Plan: Vanc 1g IV q24 - goal AUC 400-550 Cefepime 2g IV q12  Height: 5\' 11"  (180.3 cm) Weight: 53.5 kg (118 lb) IBW/kg (Calculated) : 75.3  Temp (24hrs), Avg:98.6 F (37 C), Min:98.5 F (36.9 C), Max:98.6 F (37 C)  Recent Labs  Lab 01/08/21 1329 01/08/21 1502  WBC 1.8*  --   CREATININE 1.32*  --   LATICACIDVEN  --  2.4*    Estimated Creatinine Clearance: 52.9 mL/min (A) (by C-G formula based on SCr of 1.32 mg/dL (H)).    No Known Allergies   Thank you for allowing pharmacy to be a part of this patient's care.  Kara Mead 01/08/2021 7:21 PM

## 2021-01-08 NOTE — ED Notes (Signed)
Pt given sandwich and soda.  

## 2021-01-08 NOTE — Telephone Encounter (Signed)
Patient was being followed by Outpatient Palliative here at [place] center to assist in symptom management and goals of care. I have worked closely with [facility] to ensure patient has methadone and taking accordingly. David [name] was given a 5 day supply which would have lasted him until today Monday 01/08/2021 with plans to refill if UDS was negative. Unfortunately he has admitted to continuing to use heroin with last reported use on today.   As per signed contract with clear understanding by patient and his mother he would no longer be able to continue to receive support from Palliative in regards to symptom management if he engage in other illicit drug use. Patient will be formally discharged from outpatient palliative support here at [facility] in regards to symptom management. He may continue to follow-up with [facility] if he desires.   Palliative will be happy to engage with patient for goals of care discussions as needed but again due to breach in contract will not be able to offer future symptom management support.    David [name], AGPCNP-BC  Palliative Medicine Team [phone]

## 2021-01-08 NOTE — ED Triage Notes (Signed)
Pt brought over from cancer center. He was supposed to have chemo today, but his BP was 60s/30s and he endorses weakness and fever. Hx of throat cancer. He is hypotensive in triage.

## 2021-01-08 NOTE — Progress Notes (Signed)
A consult was received from an ED physician for vanc/cefepime per pharmacy dosing.  The patient's profile has been reviewed for ht/wt/allergies/indication/available labs.   A one time order has been placed for vanc 1g and cefepime 2g.  Further antibiotics/pharmacy consults should be ordered by admitting physician if indicated.                       Thank you, Kara Mead 01/08/2021  2:38 PM

## 2021-01-08 NOTE — Sepsis Progress Note (Signed)
Notified provider of need to order repeat lactic acid. ° °

## 2021-01-08 NOTE — ED Notes (Signed)
This tech and RN approached pt and confronted about needles found on pt person. Pt needles placed in biohazard bag and placed inside pt belongings bag with other pt belongings. Will return to pt upon discharge. All cabinets locked in pt room.

## 2021-01-08 NOTE — ED Notes (Signed)
Pt visitor stepped outside room and informed this tech that the pt need to use the restroom. Upon entering the room this tech witness the pt place a needle into his left pocket. This tech asked the pt what the item was in his hand. The pt mumbled an inaudible noise and proceeded to use the restroom. Pt provided a urinal. Will continue to monitor pt. Nurse informed.

## 2021-01-08 NOTE — ED Notes (Signed)
Lactic 2.4 reported to A. Kathrynn Humble

## 2021-01-08 NOTE — ED Provider Notes (Signed)
  Physical Exam  BP 102/60   Pulse (!) 106   Temp 98.6 F (37 C) (Oral)   Resp (!) 23   Ht 5\' 11"  (1.803 m)   Wt 53.5 kg   SpO2 100%   BMI 16.46 kg/m   Physical Exam  ED Course/Procedures     .Critical Care Performed by: Varney Biles, MD Authorized by: Varney Biles, MD   Critical care provider statement:    Critical care time (minutes):  30   Critical care was necessary to treat or prevent imminent or life-threatening deterioration of the following conditions:  Circulatory failure   Critical care was time spent personally by me on the following activities:  Development of treatment plan with patient or surrogate, discussions with consultants, evaluation of patient's response to treatment, examination of patient, ordering and review of laboratory studies, ordering and review of radiographic studies, pulse oximetry, re-evaluation of patient's condition and review of old charts  MDM   Sepsis hemodynamic reassessment was completed at 15:30 and again at 1700.  I had assumed care from Dr. Maryan Rued.  Patient has history of carcinoma of his neck, getting chemo and radiation treatment.  He has been having generalized weakness for the last few days and reduced p.o. intake.  Had gone to the cancer clinic where he was noted to be hypotensive and sent to the ER.  Allegedly patient is having subjective fevers and is continuing to use IV drugs.  No airway concerns during my assessment.  Patient has been hypotensive throughout his ED stay.  However, he has been AOx3, and remains that way during my assessment.  Patient has received broad-spectrum antibiotics and 30 cc/kg fluid.  Over time, his BP has improved.  I have reassessed him on 2 separate occasions.  I have ordered ketamine for pain control.  Although there was a critical care consult placed, we did not hear back from them.  I do think that patient is stable for medicine admission.  Dr. Posey Pronto, medicine service will admit. Pt  aware.       Varney Biles, MD 01/08/21 [phone]

## 2021-01-08 NOTE — Sepsis Progress Note (Signed)
Sepsis protocol is being followed by [facility]. 

## 2021-01-08 NOTE — Telephone Encounter (Signed)
I called [facility] per Lexine Baton, NP to cancel [name] prescription as there has been a breach in his contract with our office. Will notify his mother, David [name], of this change.

## 2021-01-09 ENCOUNTER — Observation Stay (HOSPITAL_COMMUNITY): Payer: Medicaid Other

## 2021-01-09 ENCOUNTER — Ambulatory Visit
Admission: RE | Admit: 2021-01-09 | Discharge: 2021-01-09 | Disposition: A | Discharge status: Home / Self Care | Payer: Medicaid Other | Source: Ambulatory Visit | Attending: Radiation Oncology | Admitting: Radiation Oncology

## 2021-01-09 ENCOUNTER — Ambulatory Visit: Payer: Medicaid Other

## 2021-01-09 DIAGNOSIS — R5081 Fever presenting with conditions classified elsewhere: Secondary | ICD-10-CM | POA: Diagnosis present

## 2021-01-09 DIAGNOSIS — Z681 Body mass index (BMI) 19 or less, adult: Secondary | ICD-10-CM | POA: Diagnosis not present

## 2021-01-09 DIAGNOSIS — C328 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: Secondary | ICD-10-CM | POA: Diagnosis present

## 2021-01-09 DIAGNOSIS — R7989 Other specified abnormal findings of blood chemistry: Secondary | ICD-10-CM | POA: Diagnosis present

## 2021-01-09 DIAGNOSIS — Z20822 Contact with and (suspected) exposure to covid-19: Secondary | ICD-10-CM | POA: Diagnosis present

## 2021-01-09 DIAGNOSIS — T451X5A Adverse effect of antineoplastic and immunosuppressive drugs, initial encounter: Secondary | ICD-10-CM

## 2021-01-09 DIAGNOSIS — A419 Sepsis, unspecified organism: Secondary | ICD-10-CM | POA: Diagnosis present

## 2021-01-09 DIAGNOSIS — E876 Hypokalemia: Secondary | ICD-10-CM | POA: Diagnosis present

## 2021-01-09 DIAGNOSIS — F17210 Nicotine dependence, cigarettes, uncomplicated: Secondary | ICD-10-CM | POA: Diagnosis present

## 2021-01-09 DIAGNOSIS — E871 Hypo-osmolality and hyponatremia: Secondary | ICD-10-CM | POA: Diagnosis present

## 2021-01-09 DIAGNOSIS — I959 Hypotension, unspecified: Secondary | ICD-10-CM | POA: Diagnosis not present

## 2021-01-09 DIAGNOSIS — D701 Agranulocytosis secondary to cancer chemotherapy: Secondary | ICD-10-CM | POA: Diagnosis not present

## 2021-01-09 DIAGNOSIS — R6510 Systemic inflammatory response syndrome (SIRS) of non-infectious origin without acute organ dysfunction: Secondary | ICD-10-CM

## 2021-01-09 DIAGNOSIS — D61818 Other pancytopenia: Secondary | ICD-10-CM | POA: Diagnosis present

## 2021-01-09 DIAGNOSIS — Z5900 Homelessness unspecified: Secondary | ICD-10-CM | POA: Diagnosis not present

## 2021-01-09 DIAGNOSIS — F1110 Opioid abuse, uncomplicated: Secondary | ICD-10-CM | POA: Diagnosis present

## 2021-01-09 DIAGNOSIS — E43 Unspecified severe protein-calorie malnutrition: Secondary | ICD-10-CM | POA: Diagnosis present

## 2021-01-09 DIAGNOSIS — N179 Acute kidney failure, unspecified: Secondary | ICD-10-CM

## 2021-01-09 DIAGNOSIS — E861 Hypovolemia: Secondary | ICD-10-CM | POA: Diagnosis present

## 2021-01-09 DIAGNOSIS — D709 Neutropenia, unspecified: Secondary | ICD-10-CM | POA: Diagnosis present

## 2021-01-09 DIAGNOSIS — E860 Dehydration: Secondary | ICD-10-CM | POA: Diagnosis present

## 2021-01-09 DIAGNOSIS — R070 Pain in throat: Secondary | ICD-10-CM | POA: Diagnosis present

## 2021-01-09 DIAGNOSIS — Z7952 Long term (current) use of systemic steroids: Secondary | ICD-10-CM | POA: Diagnosis not present

## 2021-01-09 DIAGNOSIS — B950 Streptococcus, group A, as the cause of diseases classified elsewhere: Secondary | ICD-10-CM | POA: Diagnosis present

## 2021-01-09 DIAGNOSIS — Z79899 Other long term (current) drug therapy: Secondary | ICD-10-CM | POA: Diagnosis not present

## 2021-01-09 LAB — BLOOD CULTURE ID PANEL (REFLEXED) - BCID2

## 2021-01-09 LAB — CBC WITH DIFFERENTIAL/PLATELET
Band Neutrophils: 0 %
Basophils Absolute: 0 10*3/uL (ref 0.0–0.1)
Basophils Relative: 0 %
Eosinophils Absolute: 0 10*3/uL (ref 0.0–0.5)
Eosinophils Relative: 0 %
HCT: 28.5 % — ABNORMAL LOW (ref 39.0–52.0)
Hemoglobin: 9.9 g/dL — ABNORMAL LOW (ref 13.0–17.0)
Lymphocytes Relative: 10 %
Lymphs Abs: 0.1 10*3/uL — ABNORMAL LOW (ref 0.7–4.0)
MCH: 29.6 pg (ref 26.0–34.0)
MCHC: 34.7 g/dL (ref 30.0–36.0)
MCV: 85.1 fL (ref 80.0–100.0)
Monocytes Absolute: 0.3 10*3/uL (ref 0.1–1.0)
Monocytes Relative: 28 %
Neutro Abs: 0.6 10*3/uL — ABNORMAL LOW (ref 1.7–7.7)
Neutrophils Relative %: 62 %
Platelets: 78 10*3/uL — ABNORMAL LOW (ref 150–400)
RBC: 3.35 MIL/uL — ABNORMAL LOW (ref 4.22–5.81)
RDW: 15.9 % — ABNORMAL HIGH (ref 11.5–15.5)
WBC: 1 10*3/uL — CL (ref 4.0–10.5)
nRBC: 0 % (ref 0.0–0.2)
nRBC: NONE SEEN /100 WBC

## 2021-01-09 LAB — MAGNESIUM: Magnesium: 1.4 mg/dL — ABNORMAL LOW (ref 1.7–2.4)

## 2021-01-09 LAB — BASIC METABOLIC PANEL
Anion gap: 6 (ref 5–15)
BUN: 12 mg/dL (ref 6–20)
CO2: 28 mmol/L (ref 22–32)
Calcium: 7.3 mg/dL — ABNORMAL LOW (ref 8.9–10.3)
Chloride: 99 mmol/L (ref 98–111)
Creatinine, Ser: 0.79 mg/dL (ref 0.61–1.24)
GFR, Estimated: >60 mL/min (ref >60)
Glucose, Bld: 122 mg/dL — ABNORMAL HIGH (ref 70–99)
Potassium: 3.3 mmol/L — ABNORMAL LOW (ref 3.5–5.1)
Sodium: 133 mmol/L — ABNORMAL LOW (ref 135–145)

## 2021-01-09 LAB — PROCALCITONIN: Procalcitonin: 5.91 ng/mL

## 2021-01-09 LAB — LACTIC ACID, PLASMA: Lactic Acid, Venous: 1.8 mmol/L (ref 0.5–1.9)

## 2021-01-09 MED ORDER — FOOD THICKENER (SIMPLYTHICK): 1.0000 | ORAL | Status: DC | PRN

## 2021-01-09 MED ORDER — METHADONE HCL 10 MG PO TABS
10.0000 mg | ORAL_TABLET | Freq: Three times a day (TID) | ORAL | Status: DC | PRN
  Administered 2021-01-09 – 2021-01-11 (×6): 10 mg via ORAL
  Filled 2021-01-09 (×7): qty 1

## 2021-01-09 MED ORDER — MUSCLE RUB 10-15 % EX CREA
TOPICAL_CREAM | CUTANEOUS | Status: DC | PRN
  Administered 2021-01-09: 1 via TOPICAL
  Filled 2021-01-09: qty 85

## 2021-01-09 MED ORDER — NAPROXEN 250 MG PO TABS
500.0000 mg | ORAL_TABLET | Freq: Two times a day (BID) | ORAL | Status: DC | PRN
  Administered 2021-01-10 – 2021-01-11 (×2): 500 mg via ORAL
  Filled 2021-01-09 (×4): qty 2

## 2021-01-09 MED ORDER — SODIUM CHLORIDE 0.9 % IV SOLN
2.0000 g | Freq: Three times a day (TID) | INTRAVENOUS | Status: DC
  Administered 2021-01-09: 2 g via INTRAVENOUS
  Filled 2021-01-09: qty 2

## 2021-01-09 MED ORDER — POTASSIUM CHLORIDE CRYS ER 20 MEQ PO TBCR
20.0000 meq | EXTENDED_RELEASE_TABLET | Freq: Every day | ORAL | Status: DC
  Administered 2021-01-10 – 2021-01-11 (×2): 20 meq via ORAL
  Filled 2021-01-09 (×2): qty 1

## 2021-01-09 MED ORDER — POTASSIUM CHLORIDE 20 MEQ PO PACK
40.0000 meq | PACK | Freq: Once | ORAL | Status: AC
  Administered 2021-01-09: 40 meq via ORAL
  Filled 2021-01-09: qty 2

## 2021-01-09 MED ORDER — MAGIC MOUTHWASH
15.0000 mL | Freq: Four times a day (QID) | ORAL | Status: DC
  Administered 2021-01-09 – 2021-01-11 (×10): 15 mL via ORAL
  Filled 2021-01-09 (×12): qty 15

## 2021-01-09 MED ORDER — MAGNESIUM SULFATE 2 GM/50ML IV SOLN
2.0000 g | Freq: Once | INTRAVENOUS | Status: AC
  Administered 2021-01-09: 2 g via INTRAVENOUS
  Filled 2021-01-09: qty 50

## 2021-01-09 MED ORDER — ONDANSETRON 4 MG PO TBDP: 4.0000 mg | ORAL_TABLET | Freq: Four times a day (QID) | ORAL | Status: DC | PRN

## 2021-01-09 MED ORDER — TIOTROPIUM BROMIDE MONOHYDRATE 18 MCG IN CAPS
18.0000 ug | ORAL_CAPSULE | Freq: Every day | RESPIRATORY_TRACT | Status: DC
Start: 2021-01-09 — End: 2021-01-09

## 2021-01-09 MED ORDER — MOMETASONE FURO-FORMOTEROL FUM 200-5 MCG/ACT IN AERO
2.0000 | INHALATION_SPRAY | Freq: Two times a day (BID) | RESPIRATORY_TRACT | Status: DC
  Administered 2021-01-09 – 2021-01-11 (×4): 2 via RESPIRATORY_TRACT
  Filled 2021-01-09: qty 8.8

## 2021-01-09 MED ORDER — LOPERAMIDE HCL 2 MG PO CAPS: 2.0000 mg | ORAL_CAPSULE | ORAL | Status: DC | PRN

## 2021-01-09 MED ORDER — PENICILLIN G POTASSIUM 20000000 UNITS IJ SOLR
12.0000 10*6.[IU] | Freq: Two times a day (BID) | INTRAVENOUS | Status: DC
  Administered 2021-01-09 – 2021-01-11 (×4): 12 10*6.[IU] via INTRAVENOUS
  Filled 2021-01-09 (×7): qty 12

## 2021-01-09 MED ORDER — DICYCLOMINE HCL 20 MG PO TABS
20.0000 mg | ORAL_TABLET | Freq: Four times a day (QID) | ORAL | Status: DC | PRN
  Filled 2021-01-09: qty 1

## 2021-01-09 MED ORDER — TBO-FILGRASTIM 300 MCG/0.5ML ~~LOC~~ SOSY
300.0000 ug | PREFILLED_SYRINGE | Freq: Every day | SUBCUTANEOUS | Status: DC
  Administered 2021-01-09: 300 ug via SUBCUTANEOUS
  Filled 2021-01-09: qty 0.5

## 2021-01-09 MED ORDER — METHOCARBAMOL 500 MG PO TABS
500.0000 mg | ORAL_TABLET | Freq: Three times a day (TID) | ORAL | Status: DC | PRN
  Administered 2021-01-09 – 2021-01-11 (×2): 500 mg via ORAL
  Filled 2021-01-09 (×2): qty 1

## 2021-01-09 MED ORDER — NYSTATIN 100000 UNIT/ML MT SUSP
5.0000 mL | Freq: Four times a day (QID) | OROMUCOSAL | Status: DC
  Administered 2021-01-09 – 2021-01-11 (×11): 500000 [IU] via ORAL
  Filled 2021-01-09 (×10): qty 5

## 2021-01-09 MED ORDER — VANCOMYCIN HCL 750 MG/150ML IV SOLN
750.0000 mg | Freq: Two times a day (BID) | INTRAVENOUS | Status: DC
Start: 2021-01-09 — End: 2021-01-09
  Administered 2021-01-09: 750 mg via INTRAVENOUS
  Filled 2021-01-09 (×2): qty 150

## 2021-01-09 MED ORDER — SODIUM CHLORIDE 0.9 % IV SOLN: INTRAVENOUS | Status: AC

## 2021-01-09 MED ORDER — ORAL CARE MOUTH RINSE
15.0000 mL | Freq: Two times a day (BID) | OROMUCOSAL | Status: DC
  Administered 2021-01-09 – 2021-01-11 (×5): 15 mL via OROMUCOSAL

## 2021-01-09 MED ORDER — UMECLIDINIUM BROMIDE 62.5 MCG/ACT IN AEPB
1.0000 | INHALATION_SPRAY | Freq: Every day | RESPIRATORY_TRACT | Status: DC
  Administered 2021-01-10 – 2021-01-11 (×2): 1 via RESPIRATORY_TRACT
  Filled 2021-01-09: qty 7

## 2021-01-09 MED ORDER — IPRATROPIUM-ALBUTEROL 0.5-2.5 (3) MG/3ML IN SOLN: 3.0000 mL | RESPIRATORY_TRACT | Status: DC | PRN

## 2021-01-09 MED ORDER — METHADONE HCL 10 MG PO TABS: 10.0000 mg | ORAL_TABLET | ORAL | Status: DC | PRN

## 2021-01-09 MED ORDER — HYDROXYZINE HCL 25 MG PO TABS
25.0000 mg | ORAL_TABLET | Freq: Four times a day (QID) | ORAL | Status: DC | PRN
  Administered 2021-01-09 – 2021-01-11 (×5): 25 mg via ORAL
  Filled 2021-01-09 (×5): qty 1

## 2021-01-09 NOTE — Progress Notes (Signed)
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  Per admitting physician patient stated continues to abuse opioids and injected heroin in his right arm and methadone in his left arm early on on the day of admission.  Patient did state had a fever at home initially of 102 and subsequently 101.6 on recheck.  Patient had presented for radiation treatment found to be hypotensive with blood pressure 60/30 and sent to the ED for further evaluation.  Patient noted to be tachypneic, hypotensive on presentation to the ED.  Lab work noted a white count of 1.8, hemoglobin of 10.7, platelet count of 124, sodium of 128, potassium of 3.5, creatinine of 1.32.  SARS coronavirus 2 PCR was negative.  Influenza AMB negative.  Patient pancultured.  Patient placed empirically on IV vancomycin, IV cefepime, IV Flagyl with fluid resuscitation.  Patient given dose of IV Dilaudid and oral methadone.  Oncology consulted and following.   Assessment & Plan:   Principal Problem:   SIRS (systemic inflammatory response syndrome) (HCC) Active Problems:   Pancytopenia (HCC)   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)   Hypotension   AKI (acute kidney injury) ([place])   Hyponatremia   Leukopenia due to antineoplastic chemotherapy (HCC)   Hypomagnesemia   Hypokalemia  #1 SIRS/hypotension, POA -Patient had presented with significant hypotension, tachycardia, tachypnea, elevated lactic acid level found likely secondary to profound dehydration in the setting of IV drug use.  Patient noted to have a new leukopenia/neutropenia with  reported fevers at home concern for neutropenic fevers. -Lactic acid level noted to be elevated and trending down. -Procalcitonin elevated. -Patient with history of ongoing IV drug use with heroin. -Patient pancultured. -Chest x-ray negative for any infiltrate. -Urinalysis unremarkable. -Blood cultures 1/4 positive for Streptococcus species/Streptococcus pyogenes, by BCID. -Patient with a worsening neutropenia and now has been started on Granix per oncology. -??  2D echo -Continue empiric IV vancomycin, IV cefepime, IV Flagyl pending culture results and immunosuppressed status. -Consult with ID for further evaluation, recommendations and antibiotic duration.  2.  Pancytopenia -Patient with a worsening neutropenia with ANC currently at 0.6. -Hemoglobin of 9.9. -Platelet count trending down currently at 78 from 124 on admission from 161 (01/01/2021). -Likely secondary to chemotherapy. -Patient being followed by oncology and started on Granix this morning until [place] greater than 1.5. -Discontinue Lovenox. -Continue empiric IV antibiotics.  3.  Acute kidney injury -Creatinine noted at 1.32 on admission compared to 0.84 on 01/01/2021. -Felt likely secondary to prerenal azotemia. -Renal function improved with hydration. -Continue IV fluids for another 24 hours.  4.  Hypokalemia/hypomagnesemia -Patient received 2 g of IV magnesium this morning. -We will give another 2 g of IV magnesium, K. Dur 40 mEq p.o. x1. -Repeat labs in the morning.  5.  Hyponatremia -Likely secondary to hypovolemic hyponatremia. -Improved with hydration. -Follow.  6.  Stage IV laryngeal cancer -Being followed by Dr. Lorna Few with oncology, on active chemotherapy with cisplatin with last infusion 12/25/2020. -Patient undergoing radiation treatment by radial Dr. Isidore Moos and due for radiation today. -Patient denies any dysphagia, protecting airway. -Consult with SLP. -Placed  on a dysphagia 3  diet. -Continue home regimen Decadron, viscous lidocaine solution every 6 hours as needed, Magic mouthwash. -Oncology following.  7.  Polysubstance use disorder -Patient does admit to ongoing IV drug use with heroin and methadone. -Cessation stressed to patient. -Continue home regimen daily methadone. -Resume half home regimen of as needed methadone for breakthrough pain. -TOC consult.  8.  Tobacco use -Nicotine patch.   DVT prophylaxis: SCDs Code Status: Full Family Communication: Updated patient.  No family at bedside. Disposition:   Status is: Inpatient  The patient will require care spanning > 2 midnights and should be moved to inpatient because: Severity of illness      Consultants:  None  Procedures:  Chest x-ray 01/08/2021   Antimicrobials:  IV cefepime 01/08/2021>>>>> IV Flagyl 01/09/2021>>>>> 01/16/2021 IV vancomycin 01/08/2021>>>>>   Subjective: Laying in bed.  Denies any chest pain.  No shortness of breath.  No abdominal pain.  No dysuria.  Denies any difficulty swallowing.  Stated he had injected IV methadone a few days ago.  Stated he injected IV heroin early yesterday morning prior to going to the cancer center as he had run out of methadone.  Objective: Vitals:   01/09/21 1100 01/09/21 1200 01/09/21 1300 01/09/21 1400  BP: (!) 144/72 130/60 129/61 116/65  Pulse: 81 77 72 70  Resp: 13 17 11 15   Temp:  (!) 97.5 F (36.4 C)    TempSrc:  Axillary    SpO2: 100% 94% 92% 92%  Weight:      Height:        Intake/Output Summary (Last 24 hours) at 01/09/2021 1507 Last data filed at 01/09/2021 1200 Gross per 24 hour  Intake 2293.21 ml  Output 2450 ml  Net -156.79 ml   Filed Weights   01/08/21 1309  Weight: 53.5 kg    Examination:  General exam: Appears calm and comfortable  Respiratory system: Clear to auscultation. Respiratory effort normal. Cardiovascular system: S1 & S2 heard, RRR. No JVD, murmurs, rubs, gallops or clicks. No pedal  edema. Gastrointestinal system: Abdomen is nondistended, soft and nontender. No organomegaly or masses felt. Normal bowel sounds heard. Central nervous system: Alert and oriented. No focal neurological deficits. Extremities: Symmetric 5 x 5 power. Skin: No rashes, lesions or ulcers Psychiatry: Judgement and insight appear normal. Mood & affect appropriate.     Data Reviewed: I have personally reviewed following labs and imaging studies  CBC: Recent Labs  Lab 01/08/21 1329 01/09/21 0019  WBC 1.8* 1.0*  NEUTROABS 1.0* 0.6*  HGB 10.7* 9.9*  HCT 31.8* 28.5*  MCV 86.6 85.1  PLT 124* 78*    Basic Metabolic Panel: Recent Labs  Lab 01/08/21 1329 01/09/21 0019  NA 128* 133*  K 3.5 3.3*  CL 91* 99  CO2 26 28  GLUCOSE 91 122*  BUN 21* 12  CREATININE 1.32* 0.79  CALCIUM 8.4* 7.3*  MG  --  1.4*    GFR: Estimated Creatinine Clearance: 87.3 mL/min (by C-G formula based on SCr of 0.79 mg/dL).  Liver Function Tests: Recent Labs  Lab 01/08/21 1329  AST 36  ALT 42  ALKPHOS 80  BILITOT 1.1  PROT 6.1*  ALBUMIN 2.8*    CBG: Recent Labs  Lab 01/08/21 1321  GLUCAP 98     Recent Results (from the past 240 hour(s))  Blood culture (routine x 2)     Status: None (Preliminary result)   Collection Time: 01/08/21  1:30 PM   Specimen: BLOOD RIGHT FOREARM  Result Value Ref Range Status   Specimen Description   Final    BLOOD RIGHT FOREARM Performed at [facility] [address] [place], [place] [address]    Special Requests   Final    BOTTLES DRAWN AEROBIC AND ANAEROBIC Blood Culture results may not be optimal due to an inadequate volume of blood received in culture bottles Performed at [facility] [address] [place], [place] [address]    Culture  Setup Time   Final    GRAM POSITIVE COCCI IN CHAINS ANAEROBIC BOTTLE ONLY CRITICAL RESULT CALLED TO, READ BACK BY AND VERIFIED WITH: PHARMD Burr Medico [id number] AT 1043 BY  CM Performed at [facility], [place] [address] [place], [place] [address]    Culture GRAM POSITIVE COCCI  Final   Report Status PENDING  Incomplete  Blood Culture ID Panel (Reflexed)     Status: Abnormal   Collection Time: 01/08/21  1:30 PM  Result Value Ref Range Status   Enterococcus faecalis NOT DETECTED NOT DETECTED Final   Enterococcus Faecium NOT DETECTED NOT DETECTED Final   Listeria monocytogenes NOT DETECTED NOT DETECTED Final   Staphylococcus species NOT DETECTED NOT DETECTED Final   Staphylococcus aureus (BCID) NOT DETECTED NOT DETECTED Final   Staphylococcus epidermidis NOT DETECTED NOT DETECTED Final   Staphylococcus lugdunensis NOT DETECTED NOT DETECTED Final   Streptococcus species DETECTED (A) NOT DETECTED Final    Comment: CRITICAL RESULT CALLED TO, READ BACK BY AND VERIFIED WITH: PHARMD T GREENE [id number] AT 1043 BY CM    Streptococcus agalactiae NOT DETECTED NOT DETECTED Final   Streptococcus pneumoniae NOT DETECTED NOT DETECTED Final   Streptococcus pyogenes DETECTED (A) NOT DETECTED Final    Comment: CRITICAL RESULT CALLED TO, READ BACK BY AND VERIFIED WITH: PHARMD T GREENE [id number] AT 1043 BY CM    A.calcoaceticus-baumannii NOT DETECTED NOT DETECTED Final   Bacteroides fragilis NOT DETECTED NOT DETECTED Final   Enterobacterales NOT DETECTED NOT DETECTED Final   Enterobacter cloacae complex NOT DETECTED NOT DETECTED Final   Escherichia coli NOT DETECTED NOT DETECTED Final   Klebsiella aerogenes NOT DETECTED NOT DETECTED Final   Klebsiella oxytoca NOT DETECTED NOT DETECTED Final   Klebsiella pneumoniae NOT DETECTED NOT DETECTED Final   Proteus species NOT DETECTED NOT DETECTED Final   Salmonella species NOT DETECTED NOT DETECTED Final   Serratia marcescens NOT DETECTED NOT DETECTED Final   Haemophilus influenzae NOT DETECTED NOT DETECTED Final   Neisseria meningitidis NOT DETECTED NOT DETECTED Final   Pseudomonas aeruginosa NOT DETECTED NOT DETECTED Final    Stenotrophomonas maltophilia NOT DETECTED NOT DETECTED Final   Candida albicans NOT DETECTED NOT DETECTED Final   Candida auris NOT DETECTED NOT DETECTED Final   Candida glabrata NOT DETECTED NOT DETECTED Final   Candida krusei NOT DETECTED NOT DETECTED Final   Candida parapsilosis NOT DETECTED NOT DETECTED Final   Candida tropicalis NOT DETECTED NOT DETECTED Final   Cryptococcus neoformans/gattii NOT DETECTED NOT DETECTED Final    Comment: Performed at [facility], [address], [place], [place] [address]  Blood culture (routine x 2)     Status: None (Preliminary result)   Collection Time: 01/08/21  1:35 PM   Specimen: BLOOD  Result Value Ref Range Status   Specimen Description   Final    BLOOD RIGHT ANTECUBITAL Performed at [facility] [address], [place], [place] [address]    Special Requests   Final  BOTTLES DRAWN AEROBIC ONLY Blood Culture results may not be optimal due to an inadequate volume of blood received in culture bottles Performed at [facility], [place] [address] [place], [place] [address]    Culture   Final    NO GROWTH < 24 HOURS Performed at [facility] [address], [place], [place] [address]    Report Status PENDING  Incomplete  Resp Panel by RT-PCR (Flu A&B, Covid) Nasopharyngeal Swab     Status: None   Collection Time: 01/08/21  1:49 PM   Specimen: Nasopharyngeal Swab; Nasopharyngeal(NP) swabs in vial transport medium  Result Value Ref Range Status   SARS Coronavirus 2 by RT PCR NEGATIVE NEGATIVE Final    Comment: (NOTE) SARS-CoV-2 target nucleic acids are NOT DETECTED.  The SARS-CoV-2 RNA is generally detectable in upper respiratory specimens during the acute phase of infection. The lowest concentration of SARS-CoV-2 viral copies this assay can detect is 138 copies/mL. A negative result does not preclude SARS-Cov-2 infection and should not be used as the sole basis for treatment or other  patient management decisions. A negative result may occur with  improper specimen collection/handling, submission of specimen other than nasopharyngeal swab, presence of viral mutation(s) within the areas targeted by this assay, and inadequate number of viral copies(<138 copies/mL). A negative result must be combined with clinical observations, patient history, and epidemiological information. The expected result is Negative.  Fact Sheet for Patients:  EntrepreneurPulse.com.au  Fact Sheet for Healthcare Providers:  IncredibleEmployment.be  This test is no t yet approved or cleared by the [place] FDA and  has been authorized for detection and/or diagnosis of SARS-CoV-2 by FDA under an Emergency Use Authorization (EUA). This EUA will remain  in effect (meaning this test can be used) for the duration of the COVID-19 declaration under Section 564(b)(1) of the Act, 21 U.S.C.section 360bbb-3(b)(1), unless the authorization is terminated  or revoked sooner.       Influenza A by PCR NEGATIVE NEGATIVE Final   Influenza B by PCR NEGATIVE NEGATIVE Final    Comment: (NOTE) The Xpert Xpress SARS-CoV-2/FLU/RSV plus assay is intended as an aid in the diagnosis of influenza from Nasopharyngeal swab specimens and should not be used as a sole basis for treatment. Nasal washings and aspirates are unacceptable for Xpert Xpress SARS-CoV-2/FLU/RSV testing.  Fact Sheet for Patients: EntrepreneurPulse.com.au  Fact Sheet for Healthcare Providers: IncredibleEmployment.be  This test is not yet approved or cleared by the [place] FDA and has been authorized for detection and/or diagnosis of SARS-CoV-2 by FDA under an Emergency Use Authorization (EUA). This EUA will remain in effect (meaning this test can be used) for the duration of the COVID-19 declaration under Section 564(b)(1) of the Act, 21 U.S.C. section  360bbb-3(b)(1), unless the authorization is terminated or revoked.  Performed at [facility], [place] [address], [place], [place] [address]          Radiology Studies: DG Chest Port 1 View  Result Date: 01/08/2021 CLINICAL DATA:  Hypotension EXAM: PORTABLE CHEST 1 VIEW COMPARISON:  02/18/2019 FINDINGS: The heart size and mediastinal contours are within normal limits. Both lungs are clear. The visualized skeletal structures are unremarkable. IMPRESSION: No acute abnormality of the lungs in AP portable projection. Electronically Signed   By: Delanna Ahmadi M.D.   On: 01/08/2021 14:04        Scheduled Meds:  Chlorhexidine Gluconate Cloth  6 each Topical Daily   clotrimazole  10 mg Oral  5 X Daily   dexamethasone  4 mg Oral BID WC   dronabinol  2.5 mg Oral BID AC   magic mouthwash  15 mL Oral QID   mouth rinse  15 mL Mouth Rinse BID   methadone  60 mg Oral Daily   mometasone-formoterol  2 puff Inhalation BID   nicotine  21 mg Transdermal Daily   nystatin  5 mL Oral QID   pantoprazole  40 mg Oral Daily   [START ON 01/10/2021] potassium chloride SA  20 mEq Oral Daily   Tbo-filgastrim (GRANIX) SQ  300 mcg Subcutaneous q1800   umeclidinium bromide  1 puff Inhalation Daily   Continuous Infusions:  sodium chloride 125 mL/hr at 01/09/21 1100   ceFEPime (MAXIPIME) IV 2 g (01/09/21 1406)   metronidazole Stopped (01/09/21 0531)   vancomycin Stopped (01/09/21 1203)     LOS: 0 days    Time spent: 45 minutes    Irine Seal, MD [facility]   To contact the attending provider between 7A-7P or the covering provider during after hours 7P-7A, please log into the web site [email or web address] and access using universal [place] password for that web site. If you do not have the password, please call the hospital operator.  01/09/2021, 3:07 PM

## 2021-01-09 NOTE — Progress Notes (Signed)
Pharmacy Antibiotic Note  David [name] is a 45 y.o. male admitted on 01/08/2021 with sepsis.  Pharmacy has been consulted for vancomycin & cefepime dosing.  01/09/2021 D2 vanc/cefepime/flagyl. WBC 1, ANC 0.6 (s/p chemo) SCr 1.32> 0.79 LA 4.4> 1.8 PCT 5.91 AF BCx 1/4 bottles: gram positive cocci in chains  Plan: Increase cefepime to 2 gm IV q8h Increase vancomycin to 750 mg IV q12 for est AUC 507 using SCr 0.8 & TBW Flagyl 500 q12 per MD F/u renal function, WBC, temp, culture data Vancomycin levels as needed  Height: 5\' 11"  (180.3 cm) Weight: 53.5 kg (118 lb) IBW/kg (Calculated) : 75.3  Temp (24hrs), Avg:98.8 F (37.1 C), Min:98.3 F (36.8 C), Max:99.8 F (37.7 C)  Recent Labs  Lab 01/08/21 1329 01/08/21 1502 01/08/21 2044 01/09/21 0019  WBC 1.8*  --   --  1.0*  CREATININE 1.32*  --   --  0.79  LATICACIDVEN  --  2.4* 4.4* 1.8    Estimated Creatinine Clearance: 87.3 mL/min (by C-G formula based on SCr of 0.79 mg/dL).    No Known Allergies Antimicrobials this admission:  12/12 cefepime> 12/12 vanc>> 12/12 flagyl>> Dose adjustments this admission:  12/13 vanc 1 q24> 750 q12 12/12 cefepime 2 q12> 2 q8  Microbiology results:  12/12 MRSA canceled 12/12 BCx2: 1/4 GPC in chains  Thank you for allowing pharmacy to be a part of this patient's care.  Eudelia Bunch, Pharm.D 01/09/2021 8:37 AM

## 2021-01-09 NOTE — Plan of Care (Signed)
Pt currently tx for sepsis; protective precautions in place for WBC count 1.8 - now 1.0.  urinary output adequate.  Remains afebrile Problem: Education: Goal: Knowledge of General Education information will improve Description: Including pain rating scale, medication(s)/side effects and non-pharmacologic comfort measures Outcome: Progressing   Problem: Health Behavior/Discharge Planning: Goal: Ability to manage health-related needs will improve Outcome: Progressing   Problem: Clinical Measurements: Goal: Ability to maintain clinical measurements within normal limits will improve Outcome: Progressing Goal: Will remain free from infection Outcome: Progressing Goal: Diagnostic test results will improve Outcome: Progressing Goal: Respiratory complications will improve Outcome: Progressing Goal: Cardiovascular complication will be avoided Outcome: Progressing   Problem: Activity: Goal: Risk for activity intolerance will decrease Outcome: Progressing   Problem: Nutrition: Goal: Adequate nutrition will be maintained Outcome: Progressing   Problem: Coping: Goal: Level of anxiety will decrease Outcome: Progressing   Problem: Elimination: Goal: Will not experience complications related to bowel motility Outcome: Progressing Goal: Will not experience complications related to urinary retention Outcome: Progressing   Problem: Pain Managment: Goal: General experience of comfort will improve Outcome: Progressing   Problem: Safety: Goal: Ability to remain free from injury will improve Outcome: Progressing   Problem: Skin Integrity: Goal: Risk for impaired skin integrity will decrease Outcome: Progressing   Problem: Fluid Volume: Goal: Hemodynamic stability will improve Outcome: Progressing   Problem: Clinical Measurements: Goal: Diagnostic test results will improve Outcome: Progressing Goal: Signs and symptoms of infection will decrease Outcome: Progressing   Problem:  Respiratory: Goal: Ability to maintain adequate ventilation will improve Outcome: Progressing

## 2021-01-09 NOTE — Progress Notes (Signed)
Patient belongings sent home with mother, Nyoka Cowden, per patient request. Mother brought fresh clothes for patient and kept shoes and phone at bedside.

## 2021-01-09 NOTE — Progress Notes (Addendum)
HEMATOLOGY-ONCOLOGY PROGRESS NOTE  SUBJECTIVE: 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  He was started on concurrent chemoradiation with cisplatin 100 mg per metered squared every 3 weeks given with concurrent chemoradiation under the care of Dr. Isidore Moos.  First dose was given on 12/25/2020.  Now admitted with SIRS/hypotension.  The patient was seen in his hospital room.  No family at the bedside.  He is waiting on his pain medication.  He offers no specific complaints to me this morning.  Oncology History  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 ([place])  12/01/2020 Cancer Staging   Staging form: Larynx - Supraglottis, AJCC 8th Edition - Clinical stage from 12/01/2020: Stage IVB (cT3, cN3b, cM0) - Signed by [name], MD on 12/20/2020 Stage prefix: Initial diagnosis    12/04/2020 Initial Diagnosis   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 [facility])   12/25/2020 -  Chemotherapy   Patient is on Treatment Plan : HEAD/NECK Cisplatin + XRT q21d        REVIEW OF SYSTEMS:   Constitutional: He is not having any fevers or chills this morning Eyes: Denies blurriness of vision Ears, nose, mouth, throat, and face: Denies mucositis or sore throat Respiratory: Denies cough, dyspnea or wheezes Cardiovascular: Denies palpitation, chest discomfort Gastrointestinal:  Denies nausea, heartburn or change in bowel habits Skin: Denies abnormal skin rashes Lymphatics: Denies new lymphadenopathy or easy bruising Neurological:Denies numbness, tingling or new weaknesses Behavioral/Psych: Mood is stable, no new changes  Extremities: No lower extremity edema All other systems were reviewed with the patient and are negative.  I have reviewed the past medical history, past surgical history, social history and family history with the patient and they are unchanged from previous note.   PHYSICAL EXAMINATION: ECOG  PERFORMANCE STATUS: 1 - Symptomatic but completely ambulatory  Vitals:   01/09/21 0700 01/09/21 0730  BP: (!) 121/52   Pulse: 80   Resp: 16   Temp:  98.3 F (36.8 C)  SpO2: 98%    Filed Weights   01/08/21 1309  Weight: 53.5 kg    Intake/Output from previous day: 12/12 0701 - 12/13 0700 In: 4293.2 [I.V.:1643.7; IV Piggyback:2649.5] Out: 1600 [Urine:1600]  GENERAL:alert, no distress and comfortable SKIN: skin color, texture, turgor are normal, no rashes or significant lesions EYES: normal, Conjunctiva are pink and non-injected, sclera clear OROPHARYNX:no exudate, no erythema and lips, buccal mucosa, and tongue normal  LUNGS: clear to auscultation and percussion with normal breathing effort HEART: regular rate & rhythm and no murmurs and no lower extremity edema ABDOMEN:abdomen soft, non-tender and normal bowel sounds NEURO: alert & oriented x 3 with fluent speech, no focal motor/sensory deficits  LABORATORY DATA:  I have reviewed the data as listed CMP Latest Ref Rng & Units 01/09/2021 01/08/2021 01/01/2021  Glucose 70 - 99 mg/dL 122(H) 91 88  BUN 6 - 20 mg/dL 12 21(H) 23(H)  Creatinine 0.61 - 1.24 mg/dL 0.79 1.32(H) 0.84  Sodium 135 - 145 mmol/L 133(L) 128(L) 135  Potassium 3.5 - 5.1 mmol/L 3.3(L) 3.5 3.2(L)  Chloride 98 - 111 mmol/L 99 91(L) 96(L)  CO2 22 - 32 mmol/L 28 26 29   Calcium 8.9 - 10.3 mg/dL 7.3(L) 8.4(L) 8.8(L)  Total Protein 6.5 - 8.1 g/dL - 6.1(L) 7.0  Total Bilirubin 0.3 - 1.2 mg/dL - 1.1 0.8  Alkaline Phos 38 - 126 U/L - 80 87  AST 15 - 41 U/L - 36 93(H)  ALT 0 - 44 U/L - 42 120(H)    Lab Results  Component Value Date   WBC 1.0 (LL) 01/09/2021   HGB 9.9 (L) 01/09/2021   HCT 28.5 (L) 01/09/2021   MCV 85.1 01/09/2021   PLT 78 (L) 01/09/2021   NEUTROABS 0.6 (L) 01/09/2021    NM PET Image Initial (PI) Skull Base To Thigh  Result Date: 12/13/2020 CLINICAL DATA:  Initial treatment strategy for right pharyngeal squamous cell carcinoma. EXAM: NUCLEAR  MEDICINE PET SKULL BASE TO THIGH TECHNIQUE: 6.4 mCi F-18 FDG was injected intravenously. Full-ring PET imaging was performed from the skull base to thigh after the radiotracer. CT data was obtained and used for attenuation correction and anatomic localization. Fasting blood glucose: 59 mg/dl COMPARISON:  CT neck 11/20/2020 FINDINGS: Mediastinal blood pool activity: SUV max 1.4 Liver activity: SUV max N/A NECK: Right eccentric oropharyngeal and hypopharyngeal mass extends caudad in the right paralaryngeal space along the right aryepiglottic fold down to the level of the cords, extending over a 6.3 cm vertical excursion with maximum SUV 24.0. There is bilateral hypermetabolic level IIa, right level III, and right superficial anterior cervical adenopathy (VI). The dominant right level II lymph node measures proximally 3.2 cm in short axis with maximum SUV 18.4. The index right level VI lymph node measures 1.1 cm in short axis on image 54 series 4 with maximum SUV 9.3. Incidental CT findings: Mild bilateral common carotid atherosclerotic calcification. CHEST: Hypermetabolic left supraclavicular and upper thoracic paraesophageal adenopathy. Index supraclavicular node 0.8 cm in short axis on image 62 series 4, maximum SUV 7.4. Index upper left paraesophageal lymph node 0.8 cm in short axis on image 56 series 4, maximum SUV 4.5. Faint patchy ground-glass opacities in the right upper lobe, left lower lobe, and left upper lobe are identified. The left upper lobe ground-glass opacity shown on image 35 series 8 has maximum SUV of 1.5. These are likely inflammatory given the distribution although surveillance chest CT is likely warranted to exclude synchronous low-grade adenocarcinoma. Incidental CT findings: Atherosclerotic calcification of the thoracic aorta and branch vessels. ABDOMEN/PELVIS: No significant abnormal hypermetabolic activity in this region. Incidental CT findings: Atherosclerosis is present, including  aortoiliac atherosclerotic disease. Prominent stool throughout the colon favors constipation. SKELETON: No significant abnormal hypermetabolic activity in this region. Incidental CT findings: none IMPRESSION: 1. Right oropharyngeal, hypopharyngeal, and supraglottic mass extends down to the level of the glottis, with a 6.3 cm vertical excursion in maximum SUV of 24.0. 2. Associated bilateral hypermetabolic level II, right level III, and right paracentral VI adenopathy along with left supraclavicular and a right upper thoracic paraesophageal hypermetabolic lymph node. 3. Faint patchy ground-glass opacities in both lungs are identified with low-grade activity. These are likely inflammatory given the distribution although surveillance chest CT is likely warranted in 3-6 months to exclude synchronous low-grade adenocarcinoma. 4. Aortic Atherosclerosis (ICD10-I70.0). Aortic branch vessel atherosclerosis. 5.  Prominent stool throughout the colon favors constipation. Electronically Signed   By: Van Clines M.D.   On: 12/13/2020 07:15   DG Chest Port 1 View  Result Date: 01/08/2021 CLINICAL DATA:  Hypotension EXAM: PORTABLE CHEST 1 VIEW COMPARISON:  02/18/2019 FINDINGS: The heart size and mediastinal contours are within normal limits. Both lungs are clear. The visualized skeletal structures are unremarkable. IMPRESSION: No acute abnormality of the lungs in AP portable projection. Electronically Signed   By: Delanna Ahmadi M.D.   On: 01/08/2021 14:04    ASSESSMENT AND PLAN: This is a very pleasant 45 year old Caucasian  male with head and neck cancer, squamous cell carcinoma stage IV (T3, N3, M0).  He presented with right oropharyngeal, hypopharyngeal, and supraglottic mass that extends down to the level of the glottis.  He also has bilateral paracentral adenopathy and left supraclavicular and right upper thoracic paraesophageal adenopathy.  He was diagnosed in November 2022.    The patient is currently  undergoing treatment with chemoradiation.  He is receiving cisplatin 100 mg per metered squared IV every 3 weeks.  He status post 1 cycle and tolerated it fair except for delayed nausea/vomiting and some fatigue.  He continues on radiation under the care of Dr. Isidore Moos.   Labs from this morning have been reviewed and he is neutropenic.  We will begin Granix daily until [place] is 1.5 or higher.  The patient has been seen by the palliative care team at the cancer center.  They were initially helping to manage his pain medication including methadone.  However, he violated the pain contract and they will no longer be prescribing opiates for him.  Medical oncology does not plan to take over his pain management.  We would recommend that he follow back up with [facility] for pain management upon discharge.  Future Appointments  Date Time Provider [place]  01/09/2021  3:15 PM [facility] LINAC 4 [facility] None  01/10/2021  3:15 PM [facility] LINAC 4 [facility] None  01/11/2021  2:30 PM Neff, Barbara L, RD [facility] None  01/11/2021  3:15 PM [facility] LINAC 4 [facility] None  01/12/2021  3:15 PM [facility] LINAC 4 [facility] None  01/15/2021  9:45 AM [facility] LAB [facility] None  01/15/2021 10:15 AM Curt Bears, MD [facility] None  01/15/2021 11:00 AM [facility] INFUSION [facility] None  01/15/2021 11:00 AM [facility] PALLIATIVE CARE [facility] None  01/15/2021  2:30 PM Neff, Barbara L, RD [facility] None  01/15/2021  3:15 PM [facility] LINAC 4 [facility] None  01/16/2021  3:15 PM [facility] LINAC 4 [facility] None  01/17/2021  3:15 PM [facility] LINAC 4 [facility] None  01/18/2021  2:30 PM Neff, Barbara L, RD [facility] None  01/18/2021  3:15 PM [facility] LINAC 4 [facility] None  01/19/2021  3:15 PM [facility] LINAC 4 [facility] None  01/23/2021  2:45 PM [facility] LAB [facility] None  01/23/2021  3:15 PM [facility] LINAC 4 [facility] None   01/24/2021  3:15 PM [facility] LINAC 4 [facility] None  01/25/2021  2:30 PM Neff, Barbara L, RD [facility] None  01/25/2021  3:15 PM [facility] LINAC 4 [facility] None  01/26/2021  3:15 PM [facility] LINAC 4 [facility] None  01/30/2021  2:45 PM [facility] LAB [facility] None  01/30/2021  3:15 PM [facility] LINAC 4 [facility] None  01/30/2021  3:30 PM [facility] PALLIATIVE CARE [facility] None  01/31/2021  2:30 PM Morrell Riddle, RD [facility] None  01/31/2021  3:15 PM [facility] LINAC 4 [facility] None  02/01/2021  3:15 PM [facility] LINAC 4 [facility] None  02/01/2021  3:30 PM Schinke, [place], CCC-SLP [facility]  02/02/2021  3:15 PM [facility] LINAC 4 [facility] None  02/05/2021  9:00 AM [facility] LAB [facility] None  02/05/2021  9:30 AM Heilingoetter, Cassandra L, PA-C [facility] None  02/05/2021 10:15 AM [facility] INFUSION [facility] None  02/05/2021  3:15 PM [facility] LINAC 4 [facility] None  02/06/2021  3:15 PM [facility] LINAC 4 [facility] None  02/07/2021  2:30 PM Morrell Riddle, RD [facility] None  02/07/2021  3:15 PM [facility] LINAC 4 [facility] None  02/08/2021  3:15 PM [facility] LINAC 4 [facility] None  02/09/2021  3:15 PM  [facility] LINAC 4 [facility] None  02/12/2021  2:45 PM [facility] LAB [facility] None  02/19/2021  3:00 PM [facility] LAB [facility] None  02/26/2021  9:00 AM [facility] LAB [facility] None  02/26/2021  9:30 AM Curt Bears, MD [facility] None  02/26/2021 10:15 AM [facility] INFUSION [facility] None  03/05/2021  2:45 PM [facility] LAB [facility] None  03/12/2021  2:45 PM [facility] LAB [facility] None  03/19/2021  9:15 AM [facility] LAB [facility] None  03/19/2021  9:30 AM Heilingoetter, Cassandra L, PA-C [facility] None  03/19/2021 10:15 AM [facility] INFUSION [facility] None  03/26/2021  2:45 PM [facility] LAB [facility] None  04/02/2021  2:45 PM [facility] LAB [facility] None  04/06/2021 10:00 AM Owsley,  Madison B, DMD [facility] None  04/09/2021 10:15 AM [facility] LAB [facility] None  04/09/2021 10:45 AM Curt Bears, MD [facility] None  04/09/2021 11:30 AM [facility] INFUSION [facility] None      LOS: 0 days   Mikey Bussing, DNP, AGPCNP-BC, AOCNP 01/09/21  ADDENDUM: Hematology/Oncology Attending: I had a face-to-face encounter with the patient today.  I reviewed his record, lab and recommended his care plan.  I agree with the above note.  This is a 45 years old white male diagnosed with a stage IV (T3, N3, M0) right oropharyngeal, hypopharyngeal and laryngeal squamous cell carcinoma with bilateral lymphadenopathy diagnosed in November 2022.  The patient is currently undergoing a course of concurrent chemoradiation with cisplatin 100 Mg/M2 every 3 weeks status post 1 cycle.  He also has a long history of drug abuse and hepatitis C. The patient was admitted to the hospital with sepsis and neutropenic fever. I recommend for the patient to continue his current treatment with Granix 480 mcg subcutaneously on daily basis until his absolute neutrophil count is over 1000. We will continue to monitor him closely and he can continue to proceed with the radiotherapy under the care of Dr. Isidore Moos as planned. I will also recommend for the patient to follow with the palliative care team for management of his pain issues. Thank you so much for taking good care of Mr. Simmers.  We will continue to monitor the patient with you and assist in his management on as-needed basis. Disclaimer: This note was dictated with voice recognition software. Similar sounding words can inadvertently be transcribed and may be missed upon review. Eilleen Kempf, MD

## 2021-01-09 NOTE — Progress Notes (Addendum)
Modified Barium Swallow Progress Note  Patient Details  Name: David [name] MRN: [id number] Date of Birth: 12-31-1975  Today's Date: 01/09/2021  Modified Barium Swallow completed.  Full report located under Chart Review in the Imaging Section.  Brief recommendations include the following:  Clinical Impression   Patient presents with moderately severe pharyngeal dysphagia mostly c/b decreased muscular contraction, impaired laryngeal closure/tongue base retraction likely from edema/iatrogenic effects of XRT. 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  Aforementioned deficits allow secretion aspiration, retention across all consistencies and aspiration of liquids and secretions.    Pt did not aspirate solids and retention was not severe.  Nectar consumption facilitated pharyngeal clearance.   To mitigate aspiration, recommend dys3/nectar diet - allowing tsps thin water after oral care, single Ice chips anytime.  Pt needs to double swallow with liquids - take SMALL boluses.  He both silently and audibly aspirates and thus following swallow precautions will be prudent for assurance of po tolerance.    Though pt states this is a new phenomenon, given his decreased awareness to overtly coughing with intake during clinical evaluation, suspect acute on chronic deficits from XRT.    Using teach back with video, educated pt to findings/recommendations.        SLP Diet Recommendations: Dysphagia 3 (Mech soft) solids;Nectar thick liquid (tsps thin water, single ice chips ok)       Medication Administration: Whole meds with puree (crush if large)   Supervision: Intermittent supervision to cue for compensatory strategies   Compensations: Slow rate;Small sips/bites (multiple dry swallows after liquids, clear throat and re-swallow)   Postural Changes: Remain semi-upright after after feeds/meals (Comment);Seated upright at 90 degrees   Oral Care Recommendations: Oral care QID   Other  Recommendations: Have oral suction available   Kathleen Lime, MS [facility] SLP Acute Rehab Services Office [phone] Pager [phone]  Macario Golds 01/09/2021,2:13 PM

## 2021-01-09 NOTE — Progress Notes (Signed)
4 mL of methadone wasted in narcotic bin in medication room with Angelina Sheriff RN

## 2021-01-09 NOTE — Progress Notes (Signed)
Received pt from ICU as a transfer. Pt alert and oriented x 4, see focused assessment. Pt requesting to eat his dinner tray. This RN connected pt to telemetry monitor. All questions/concerns addressed. Pt sitting up in bed eating dinner tray.

## 2021-01-09 NOTE — Evaluation (Signed)
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 6 weeks. 11/20/20 CT neck revealed asymmetric soft tissue thickening of the right oropharynx, extending into the right supraglottis and glottis, and enlarged right level 2 lymph node concerning for malignancy, C3-4, C4-5, and C5-6 degenration, after trauma 2016.  Pt has been seen by SLP as an OP for initiation of dysphagia treatment.  Per RN, oral cavity erythemic and pt c/o discomfort.  He will receive 35 fractions of radiation to his Larynx and bilateral neck along with chemotherapy once every 3 weeks ([facility]).   He started on 12/18/20 and will complete on 02/08/21.    Per SLP OP note, pt was impulsive with intake but tolerated po.    Assessment / Plan / Recommendation  Clinical Impression  Pt presents with clinical indications concerning for pharyngeal integrity of swallowing.  Cough with frothy secretion expectoration noted - most notably with liquids - but also with solids. Voice is breathy and wet - likely due to secretion aspiration.  SLP will proceed  with MBS to help elucidate secretion vs po aspiration especially given pt's laryngeal cancer.  Pt admits to occasional coughing with liquids - but denies this occuring with solids.  He also advises that he has been around a nephew who was ill.  Suspect XRT iatrogenic impact *edema* on swallowing and least restrictive diet, compensations planned via MBS.  Pt is impulsive and benefits from full supervision for precautions.  Pt advised food has "no taste" and only consumed a few bites/sips of his breakfast.  After completing snack, coughing abated again causing suspicion of potential aspiration with po.  Pt, MD and RN agreeable to MBS. SLP Visit Diagnosis: Dysphagia, oropharyngeal phase (R13.12)    Aspiration Risk  Moderate aspiration risk    Diet Recommendation Dysphagia 3 (Mech soft);Thin liquid (pending MBS)   Liquid Administration via: Cup;Straw Medication Administration: Whole meds with puree Supervision: Patient able to self feed Compensations: Slow rate;Small sips/bites Postural Changes: Seated upright at 90 degrees;Remain upright for at least 30 minutes after po intake    Other  Recommendations Oral Care Recommendations: Oral care BID Other Recommendations: Have oral suction available    Recommendations for follow up therapy are one component of a multi-disciplinary discharge planning process, led by the attending physician.  Recommendations may be updated based on patient status, additional functional criteria and insurance authorization.  Follow up Recommendations Outpatient SLP      Assistance Recommended at Discharge  full  Functional Status Assessment Patient has had a recent decline in their functional status and demonstrates the ability to make significant improvements  in function in a reasonable and predictable amount of time.  Frequency and Duration     TBD       Prognosis Prognosis for Safe Diet Advancement: Good      Swallow Study   General HPI: David [name]  is a 45 y.o. 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 6 weeks. 11/20/20 CT neck revealed asymmetric soft tissue thickening of the right oropharynx, extending into the right supraglottis and glottis, and enlarged right level 2 lymph node concerning for malignancy, C3-4, C4-5, and C5-6 degenration, after trauma 2016.  Pt has been seen by SLP as an OP for initiation of dysphagia treatment.  Per RN, oral cavity erythemic and pt c/o discomfort.  He will receive 35 fractions of radiation to his Larynx and bilateral neck along with chemotherapy once every 3 weeks ([facility]).   He started on 12/18/20 and will complete on 02/08/21.    Per SLP OP note, pt was impulsive with intake but tolerated po. Type of Study: Bedside Swallow Evaluation Previous Swallow Assessment: clinical evaluation with SLP Diet Prior to this Study: Dysphagia 3 (soft);Thin liquids Temperature Spikes Noted: No Respiratory Status: Room air History of Recent Intubation: No Behavior/Cognition: Alert;Cooperative Oral Cavity Assessment: Erythema (posterior erythema) Oral Care Completed by SLP: No Oral Cavity - Dentition: Adequate natural dentition Vision: Functional for self-feeding Self-Feeding Abilities: Able to feed self Patient Positioning: Upright in bed Baseline Vocal Quality: Hoarse;Breathy;Other (comment) (wet) Volitional Cough: Strong Volitional Swallow: Able to elicit    Oral/Motor/Sensory Function Overall Oral Motor/Sensory Function: Within functional limits   Ice Chips Ice chips: Not tested   Thin Liquid Thin Liquid: Impaired Presentation: Cup;Straw;Self Fed Pharyngeal  Phase Impairments: Multiple swallows;Cough - Immediate;Wet Vocal Quality     Nectar Thick Nectar Thick Liquid: Impaired Presentation: Cup;Self Fed Pharyngeal Phase Impairments: Cough - Delayed;Multiple swallows   Honey Thick Honey Thick Liquid: Not tested   Puree Puree: Within functional limits Presentation: Self Fed;Spoon   Solid     Solid: Impaired Presentation: Self Fed Oral Phase Functional Implications: Impaired mastication;Prolonged oral transit Pharyngeal Phase Impairments: Multiple swallows;Cough - Delayed      Macario Golds 01/09/2021,10:19 AM  Kathleen Lime, MS [place] Office [phone] Pager [phone]

## 2021-01-09 NOTE — Consult Note (Signed)
[facility]       Reason for Consult: bacteremia    Referring Physician: Dr. 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)   Hypotension   AKI (acute kidney injury) ([place])   Hyponatremia   Leukopenia due to antineoplastic chemotherapy (HCC)   Hypomagnesemia   Hypokalemia   Pancytopenia (HCC)    Chlorhexidine Gluconate Cloth  6 each Topical Daily   clotrimazole  10 mg Oral 5 X Daily   dexamethasone  4 mg Oral BID WC   dronabinol  2.5 mg Oral BID AC   magic mouthwash  15 mL Oral QID   mouth rinse  15 mL Mouth Rinse BID   methadone  60 mg Oral Daily   mometasone-formoterol  2 puff Inhalation BID   nicotine  21 mg Transdermal Daily   nystatin  5 mL Oral QID   pantoprazole  40 mg Oral Daily   [START ON 01/10/2021] potassium chloride SA  20 mEq Oral Daily   Tbo-filgastrim (GRANIX) SQ  300 mcg Subcutaneous q1800   umeclidinium bromide  1 puff Inhalation Daily    Recommendations: Continue antibiotics with penicillin Will stop other antibiotics  Assessment: He has 1 positive blood culture for group A Strep with active drug use and neutropenia from chemotherapy.  He is also getting radiation and had that today.  I suspect his fever is from the Strep. This is not typically associated with endocarditis so no indication for an echo.  I will have him continue with penicillin.    Antibiotics: Vancomycin, cefepime, metronidazole  HPI: David [name] is a 45 y.o. 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  He had a BP of 70/54 and responded to fluids.  He has not needed pressor support.  He is neutropenic.  He reported fever at home up to 102.  He is no longer getting  methadone from the cancer center after a breach of contract.  He has 1/4 blood culture bottles positive for group A Strep  bacteremia.     Review of Systems:  Constitutional: negative for fevers and chills Gastrointestinal: negative for diarrhea All other systems reviewed and are negative    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[place])    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 ([place])    Pneumothorax    Left lung spontaneous pneumothorax at age 28 yr     Social History   Tobacco Use   Smoking status: Every Day    Packs/day: 3.00    Types: Cigarettes   Smokeless tobacco: Never  Vaping Use   Vaping Use: Never used  Substance Use Topics   Alcohol use: Not Currently    Alcohol/week: 12.0 standard drinks    Types: 12 Cans of beer per week   Drug use: Not Currently    Types: IV    Comment: 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)    [place]: + cardiac disease  No Known Allergies  Physical Exam: Constitutional: in no apparent distress  Vitals:   01/09/21 1500 01/09/21 1600  BP: (!) 139/94 (!) 133/103  Pulse: (!) 104   Resp: (!) 24 15  Temp:  (!) 96.9 F (36.1 C)  SpO2: 99%    EYES: anicteric Respiratory: normal respiratory effort GI: soft Neuro: non-focal  Lab Results  Component Value  Date   WBC 1.0 (LL) 01/09/2021   HGB 9.9 (L) 01/09/2021   HCT 28.5 (L) 01/09/2021   MCV 85.1 01/09/2021   PLT 78 (L) 01/09/2021    Lab Results  Component Value Date   CREATININE 0.79 01/09/2021   BUN 12 01/09/2021   NA 133 (L) 01/09/2021   K 3.3 (L) 01/09/2021   CL 99 01/09/2021   CO2 28 01/09/2021    Lab Results  Component Value Date   ALT 42 01/08/2021   AST 36 01/08/2021   ALKPHOS 80 01/08/2021     Microbiology: Recent Results (from the past 240 hour(s))  Blood culture (routine x 2)     Status: None (Preliminary result)   Collection Time: 01/08/21  1:30 PM   Specimen: BLOOD RIGHT FOREARM  Result Value Ref Range Status   Specimen Description   Final    BLOOD RIGHT FOREARM Performed at [facility], [place] [address]  [place], [place] [address]    Special Requests   Final    BOTTLES DRAWN AEROBIC AND ANAEROBIC Blood Culture results may not be optimal due to an inadequate volume of blood received in culture bottles Performed at [facility] [address], [place], [place] [address]    Culture  Setup Time   Final    GRAM POSITIVE COCCI IN CHAINS ANAEROBIC BOTTLE ONLY CRITICAL RESULT CALLED TO, READ BACK BY AND VERIFIED WITH: PHARMD Burr Medico [id number] AT 1043 BY CM Performed at [facility], [place] [address] [place], [place] [address]    Culture GRAM POSITIVE COCCI  Final   Report Status PENDING  Incomplete  Blood Culture ID Panel (Reflexed)     Status: Abnormal   Collection Time: 01/08/21  1:30 PM  Result Value Ref Range Status   Enterococcus faecalis NOT DETECTED NOT DETECTED Final   Enterococcus Faecium NOT DETECTED NOT DETECTED Final   Listeria monocytogenes NOT DETECTED NOT DETECTED Final   Staphylococcus species NOT DETECTED NOT DETECTED Final   Staphylococcus aureus (BCID) NOT DETECTED NOT DETECTED Final   Staphylococcus epidermidis NOT DETECTED NOT DETECTED Final   Staphylococcus lugdunensis NOT DETECTED NOT DETECTED Final   Streptococcus species DETECTED (A) NOT DETECTED Final    Comment: CRITICAL RESULT CALLED TO, READ BACK BY AND VERIFIED WITH: PHARMD T GREENE [id number] AT 1043 BY CM    Streptococcus agalactiae NOT DETECTED NOT DETECTED Final   Streptococcus pneumoniae NOT DETECTED NOT DETECTED Final   Streptococcus pyogenes DETECTED (A) NOT DETECTED Final    Comment: CRITICAL RESULT CALLED TO, READ BACK BY AND VERIFIED WITH: PHARMD T GREENE [id number] AT 1043 BY CM    A.calcoaceticus-baumannii NOT DETECTED NOT DETECTED Final   Bacteroides fragilis NOT DETECTED NOT DETECTED Final   Enterobacterales NOT DETECTED NOT DETECTED Final   Enterobacter cloacae complex NOT DETECTED NOT DETECTED Final   Escherichia coli NOT DETECTED NOT DETECTED Final   Klebsiella aerogenes NOT  DETECTED NOT DETECTED Final   Klebsiella oxytoca NOT DETECTED NOT DETECTED Final   Klebsiella pneumoniae NOT DETECTED NOT DETECTED Final   Proteus species NOT DETECTED NOT DETECTED Final   Salmonella species NOT DETECTED NOT DETECTED Final   Serratia marcescens NOT DETECTED NOT DETECTED Final   Haemophilus influenzae NOT DETECTED NOT DETECTED Final   Neisseria meningitidis NOT DETECTED NOT DETECTED Final   Pseudomonas aeruginosa NOT DETECTED NOT DETECTED Final   Stenotrophomonas maltophilia NOT DETECTED NOT DETECTED Final   Candida albicans NOT DETECTED NOT DETECTED Final  Candida auris NOT DETECTED NOT DETECTED Final   Candida glabrata NOT DETECTED NOT DETECTED Final   Candida krusei NOT DETECTED NOT DETECTED Final   Candida parapsilosis NOT DETECTED NOT DETECTED Final   Candida tropicalis NOT DETECTED NOT DETECTED Final   Cryptococcus neoformans/gattii NOT DETECTED NOT DETECTED Final    Comment: Performed at [facility], [place] [address], [place], [place] [address]  Blood culture (routine x 2)     Status: None (Preliminary result)   Collection Time: 01/08/21  1:35 PM   Specimen: BLOOD  Result Value Ref Range Status   Specimen Description   Final    BLOOD RIGHT ANTECUBITAL Performed at [facility] [address], [place], [place] [address]    Special Requests   Final    BOTTLES DRAWN AEROBIC ONLY Blood Culture results may not be optimal due to an inadequate volume of blood received in culture bottles Performed at [facility] [address] [place], [place] [address]    Culture   Final    NO GROWTH < 24 HOURS Performed at [facility] [address] [place], [place] [address]    Report Status PENDING  Incomplete  Resp Panel by RT-PCR (Flu A&B, Covid) Nasopharyngeal Swab     Status: None   Collection Time: 01/08/21  1:49 PM   Specimen: Nasopharyngeal Swab; Nasopharyngeal(NP) swabs in vial transport medium  Result Value Ref  Range Status   SARS Coronavirus 2 by RT PCR NEGATIVE NEGATIVE Final    Comment: (NOTE) SARS-CoV-2 target nucleic acids are NOT DETECTED.  The SARS-CoV-2 RNA is generally detectable in upper respiratory specimens during the acute phase of infection. The lowest concentration of SARS-CoV-2 viral copies this assay can detect is 138 copies/mL. A negative result does not preclude SARS-Cov-2 infection and should not be used as the sole basis for treatment or other patient management decisions. A negative result may occur with  improper specimen collection/handling, submission of specimen other than nasopharyngeal swab, presence of viral mutation(s) within the areas targeted by this assay, and inadequate number of viral copies(<138 copies/mL). A negative result must be combined with clinical observations, patient history, and epidemiological information. The expected result is Negative.  Fact Sheet for Patients:  EntrepreneurPulse.com.au  Fact Sheet for Healthcare Providers:  IncredibleEmployment.be  This test is no t yet approved or cleared by the [place] FDA and  has been authorized for detection and/or diagnosis of SARS-CoV-2 by FDA under an Emergency Use Authorization (EUA). This EUA will remain  in effect (meaning this test can be used) for the duration of the COVID-19 declaration under Section 564(b)(1) of the Act, 21 U.S.C.section 360bbb-3(b)(1), unless the authorization is terminated  or revoked sooner.       Influenza A by PCR NEGATIVE NEGATIVE Final   Influenza B by PCR NEGATIVE NEGATIVE Final    Comment: (NOTE) The Xpert Xpress SARS-CoV-2/FLU/RSV plus assay is intended as an aid in the diagnosis of influenza from Nasopharyngeal swab specimens and should not be used as a sole basis for treatment. Nasal washings and aspirates are unacceptable for Xpert Xpress SARS-CoV-2/FLU/RSV testing.  Fact Sheet for  Patients: EntrepreneurPulse.com.au  Fact Sheet for Healthcare Providers: IncredibleEmployment.be  This test is not yet approved or cleared by the [place] FDA and has been authorized for detection and/or diagnosis of SARS-CoV-2 by FDA under an Emergency Use Authorization (EUA). This EUA will remain in effect (meaning this test can be used) for the duration  of the COVID-19 declaration under Section 564(b)(1) of the Act, 21 U.S.C. section 360bbb-3(b)(1), unless the authorization is terminated or revoked.  Performed at [facility], [place] [address], [place], [place] [address]     Jaylynn Siefert W Nyelah Emmerich, MD [facility] [email or web address] 01/09/2021, 5:09 PM

## 2021-01-09 NOTE — Progress Notes (Addendum)
PHARMACY - PHYSICIAN COMMUNICATION CRITICAL VALUE ALERT - BLOOD CULTURE IDENTIFICATION (BCID)  David [name] is an 45 y.o. male who presented to [facility] on 01/08/2021 with a chief complaint of Fever, weakness, hypotension  Assessment:  1/3 bottles Grp A strep in neutropenic (chemo) IVDA. Could be contaminant or source could be IVDA  Name of physician (or Provider) Contacted: [facility] via [facility]   Current antibiotics: vancomycin, cefepime, Flagyl  Changes to prescribed antibiotics recommended:  Rec change to Penicillin continuous infusion 12 MU IV q12 or Ceftriaxone 2 gm IV q24> not accepted by provider  Results for orders placed or performed during the hospital encounter of 01/08/21  Blood Culture ID Panel (Reflexed) (Collected: 01/08/2021  1:30 PM)  Result Value Ref Range   Enterococcus faecalis NOT DETECTED NOT DETECTED   Enterococcus Faecium NOT DETECTED NOT DETECTED   Listeria monocytogenes NOT DETECTED NOT DETECTED   Staphylococcus species NOT DETECTED NOT DETECTED   Staphylococcus aureus (BCID) NOT DETECTED NOT DETECTED   Staphylococcus epidermidis NOT DETECTED NOT DETECTED   Staphylococcus lugdunensis NOT DETECTED NOT DETECTED   Streptococcus species DETECTED (A) NOT DETECTED   Streptococcus agalactiae NOT DETECTED NOT DETECTED   Streptococcus pneumoniae NOT DETECTED NOT DETECTED   Streptococcus pyogenes DETECTED (A) NOT DETECTED   A.calcoaceticus-baumannii NOT DETECTED NOT DETECTED   Bacteroides fragilis NOT DETECTED NOT DETECTED   Enterobacterales NOT DETECTED NOT DETECTED   Enterobacter cloacae complex NOT DETECTED NOT DETECTED   Escherichia coli NOT DETECTED NOT DETECTED   Klebsiella aerogenes NOT DETECTED NOT DETECTED   Klebsiella oxytoca NOT DETECTED NOT DETECTED   Klebsiella pneumoniae NOT DETECTED NOT DETECTED   Proteus species NOT DETECTED NOT DETECTED   Salmonella species NOT DETECTED NOT DETECTED   Serratia marcescens NOT DETECTED NOT DETECTED    Haemophilus influenzae NOT DETECTED NOT DETECTED   Neisseria meningitidis NOT DETECTED NOT DETECTED   Pseudomonas aeruginosa NOT DETECTED NOT DETECTED   Stenotrophomonas maltophilia NOT DETECTED NOT DETECTED   Candida albicans NOT DETECTED NOT DETECTED   Candida auris NOT DETECTED NOT DETECTED   Candida glabrata NOT DETECTED NOT DETECTED   Candida krusei NOT DETECTED NOT DETECTED   Candida parapsilosis NOT DETECTED NOT DETECTED   Candida tropicalis NOT DETECTED NOT DETECTED   Cryptococcus neoformans/gattii NOT DETECTED NOT DETECTED    Eudelia Bunch, Pharm.D 01/09/2021 11:05 AM

## 2021-01-10 ENCOUNTER — Ambulatory Visit: Payer: Medicaid Other

## 2021-01-10 ENCOUNTER — Ambulatory Visit
Admission: RE | Admit: 2021-01-10 | Discharge: 2021-01-10 | Disposition: A | Discharge status: Home / Self Care | Payer: Medicaid Other | Source: Ambulatory Visit | Attending: Radiation Oncology | Admitting: Radiation Oncology

## 2021-01-10 DIAGNOSIS — E43 Unspecified severe protein-calorie malnutrition: Secondary | ICD-10-CM | POA: Insufficient documentation

## 2021-01-10 DIAGNOSIS — Z515 Encounter for palliative care: Secondary | ICD-10-CM

## 2021-01-10 DIAGNOSIS — R6510 Systemic inflammatory response syndrome (SIRS) of non-infectious origin without acute organ dysfunction: Secondary | ICD-10-CM | POA: Diagnosis not present

## 2021-01-10 LAB — COMPREHENSIVE METABOLIC PANEL
ALT: 32 U/L (ref 0–44)
AST: 21 U/L (ref 15–41)
Albumin: 2.3 g/dL — ABNORMAL LOW (ref 3.5–5.0)
Alkaline Phosphatase: 55 U/L (ref 38–126)
Anion gap: 9 (ref 5–15)
BUN: 13 mg/dL (ref 6–20)
CO2: 27 mmol/L (ref 22–32)
Calcium: 8.1 mg/dL — ABNORMAL LOW (ref 8.9–10.3)
Chloride: 101 mmol/L (ref 98–111)
Creatinine, Ser: 0.55 mg/dL — ABNORMAL LOW (ref 0.61–1.24)
GFR, Estimated: >60 mL/min (ref >60)
Glucose, Bld: 111 mg/dL — ABNORMAL HIGH (ref 70–99)
Potassium: 4 mmol/L (ref 3.5–5.1)
Sodium: 137 mmol/L (ref 135–145)
Total Bilirubin: 0.5 mg/dL (ref 0.3–1.2)
Total Protein: 5.8 g/dL — ABNORMAL LOW (ref 6.5–8.1)

## 2021-01-10 LAB — CBC WITH DIFFERENTIAL/PLATELET
Abs Immature Granulocytes: 0.01 10*3/uL (ref 0.00–0.07)
Basophils Absolute: 0.1 10*3/uL (ref 0.0–0.1)
Basophils Relative: 1 %
Eosinophils Absolute: 0 10*3/uL (ref 0.0–0.5)
Eosinophils Relative: 0 %
HCT: 30.3 % — ABNORMAL LOW (ref 39.0–52.0)
Hemoglobin: 9.9 g/dL — ABNORMAL LOW (ref 13.0–17.0)
Immature Granulocytes: 0 %
Lymphocytes Relative: 3 %
Lymphs Abs: 0.2 10*3/uL — ABNORMAL LOW (ref 0.7–4.0)
MCH: 28.7 pg (ref 26.0–34.0)
MCHC: 32.7 g/dL (ref 30.0–36.0)
MCV: 87.8 fL (ref 80.0–100.0)
Monocytes Absolute: 0.4 10*3/uL (ref 0.1–1.0)
Monocytes Relative: 10 %
Neutro Abs: 3.8 10*3/uL (ref 1.7–7.7)
Neutrophils Relative %: 86 %
Platelets: 104 10*3/uL — ABNORMAL LOW (ref 150–400)
RBC: 3.45 MIL/uL — ABNORMAL LOW (ref 4.22–5.81)
RDW: 16 % — ABNORMAL HIGH (ref 11.5–15.5)
WBC: 4.4 10*3/uL (ref 4.0–10.5)
nRBC: 0 % (ref 0.0–0.2)

## 2021-01-10 LAB — PHOSPHORUS: Phosphorus: 3.2 mg/dL (ref 2.5–4.6)

## 2021-01-10 LAB — MAGNESIUM: Magnesium: 2.1 mg/dL (ref 1.7–2.4)

## 2021-01-10 MED ORDER — ENSURE ENLIVE PO LIQD
237.0000 mL | Freq: Three times a day (TID) | ORAL | Status: DC
  Administered 2021-01-11 (×2): 237 mL via ORAL

## 2021-01-10 MED ORDER — PHENOL 1.4 % MT LIQD
1.0000 | OROMUCOSAL | Status: DC | PRN
  Administered 2021-01-10: 14:00:00 1 via OROMUCOSAL
  Filled 2021-01-10: qty 177

## 2021-01-10 MED ORDER — PROSOURCE PLUS PO LIQD
30.0000 mL | Freq: Two times a day (BID) | ORAL | Status: DC
  Administered 2021-01-10 – 2021-01-11 (×3): 30 mL via ORAL
  Filled 2021-01-10 (×3): qty 30

## 2021-01-10 MED ORDER — ADULT MULTIVITAMIN W/MINERALS CH
1.0000 | ORAL_TABLET | Freq: Every day | ORAL | Status: DC
  Administered 2021-01-10 – 2021-01-11 (×2): 1 via ORAL
  Filled 2021-01-10 (×2): qty 1

## 2021-01-10 NOTE — Consult Note (Incomplete)
Palliative Care Consult Note                                  Date: 01/10/2021   Patient Name: David [name]  DOB: 10-23-1975  MRN: [id number]  Age / Sex: 45 y.o., male  PCP: Default, Provider, MD Referring Physician: Donne Hazel, MD  Reason for Consultation: {Reason for Consult:23484}  HPI/Patient Profile: Palliative Care consult requested for goals of care discussion in this 45 y.o. male  with past medical history of *** admitted on 01/08/2021 with ***.   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[place])    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 ([place])    Pneumothorax    Left lung spontaneous pneumothorax at age 39 yr      Subjective:   This NP Osborne Oman reviewed medical records, received report from team, assessed the patient and then met at the patient's bedside with *** to discuss diagnosis, prognosis, GOC, EOL wishes disposition and options.   Concept of Palliative Care was introduced as specialized medical care for people and their families living with serious illness.  It focuses on providing relief from the symptoms and stress of a serious illness.  The goal is to improve quality of life for both the patient and the family. Values and goals of care important to patient and family were attempted to be elicited.  Created space and opportunity for patient and family to explore state of health prior to admission, thoughts, and feelings.   We discussed His current illness and what it means in the larger context of His on-going co-morbidities. Natural disease trajectory and expectations were discussed.  **verbalized understanding of current illness and co-morbidities.   I discussed the importance of continued conversation with family and their medical providers regarding overall plan of care and treatment options, ensuring decisions are within the context of the patients values and  GOCs.  Questions and concerns were addressed.  Hard Choices booklet left for review. The family was encouraged to call with questions or concerns.  PMT will continue to support holistically as needed.  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)  AKI (acute kidney injury) ([place])  Hyponatremia  Leukopenia due to antineoplastic chemotherapy (HCC)  SIRS (systemic inflammatory response syndrome) (HCC)  Hypomagnesemia  Hypokalemia  Pancytopenia (HCC)   Scheduled Meds:  (feeding supplement) PROSource Plus  30 mL Oral BID BM   clotrimazole  10 mg Oral 5 X Daily   dexamethasone  4 mg Oral BID WC   dronabinol  2.5 mg Oral BID AC   feeding supplement  237 mL Oral TID BM   magic mouthwash  15 mL Oral QID   mouth rinse  15 mL Mouth Rinse BID   methadone  60 mg Oral Daily   mometasone-formoterol  2 puff Inhalation BID   multivitamin with minerals  1 tablet Oral Daily   nicotine  21 mg Transdermal Daily   nystatin  5 mL Oral QID   pantoprazole  40 mg Oral Daily   potassium chloride SA  20 mEq Oral Daily   umeclidinium bromide  1 puff Inhalation Daily    Continuous Infusions:  sodium chloride 125 mL/hr at 01/10/21 0559   penicillin g continuous IV infusion 12 Million Units (01/10/21 1037)    PRN Meds:  acetaminophen **OR** acetaminophen, dicyclomine, food thickener, hydrOXYzine, ipratropium-albuterol, lidocaine, loperamide, methadone, methocarbamol, Muscle Rub, naproxen, [DISCONTINUED] ondansetron **OR** ondansetron (ZOFRAN) IV, ondansetron, phenol, senna-docusate  No Known Allergies  Review of Systems Unless otherwise noted, a complete review of systems is negative.  Physical Exam General: NAD, frail chronically-ill appearing Cardiovascular: regular rate and rhythm Pulmonary: clear ant fields, diminished bilaterally  Abdomen: soft, nontender, + bowel sounds Extremities: no  edema, no joint deformities Skin: no rashes, warm and dry Neurological:   Vital Signs:  BP 124/80 (BP Location: Right Arm)    Pulse 79    Temp 98.4 F (36.9 C) (Oral)    Resp 18    Ht 5' 11"  (1.803 m)    Wt 53.5 kg    SpO2 99%    BMI 16.46 kg/m  Pain Scale: 0-10   Pain Score: 8   SpO2: SpO2: 99 % O2 Device:SpO2: 99 % O2 Flow Rate: .   IO: Intake/output summary:  Intake/Output Summary (Last 24 hours) at 01/10/2021 1534 Last data filed at 01/10/2021 0500 Gross per 24 hour  Intake 1383.95 ml  Output 1650 ml  Net -266.05 ml    LBM: Last BM Date: 01/09/21 (per pt) Baseline Weight: Weight: 53.5 kg Most recent weight: Weight: 53.5 kg      Palliative Assessment/Data: ***   Advanced Care Planning:   Primary Decision Maker: {Primary Decision ZOXWR:60454}  Code Status/Advance Care Planning: {Palliative Code status:23503}  A discussion was had today regarding advanced directives. Concepts specific to code status, artifical feeding and hydration, continued IV antibiotics and rehospitalization was had.  The difference between a aggressive medical intervention path and a palliative comfort care path was discussed. ***The MOST form was introduced and discussed.***  Hospice and Palliative Care services outpatient were explained and offered. Patient and family verbalized their understanding and awareness of both palliative and hospice's goals and philosophy of care.   Decisions/Changes to ACP: ***  Assessment & Plan:   SUMMARY OF RECOMMENDATIONS   DNR/DNI/Full Code-as confirmed by  Continue with current plan of care  PMT will continue to support and follow as needed. Please call team line with urgent needs.  Symptom Management:  Per Attending/See below  Palliative Prophylaxis:  {Palliative Prophylaxis:21015}  Additional Recommendations (Limitations, Scope, Preferences): {Recommended Scope and Preferences:21019}  Psycho-social/Spiritual:  Desire for further Chaplaincy  support: {YES NO:22349} Additional Recommendations: {PAL SOCIAL:21064}  Prognosis:  {Palliative Care Prognosis:23504}  Discharge Planning:  {Palliative dispostion:23505}   Discussed with:     Patient*** expressed understanding and was in agreement with this plan.   Time In: *** Time Out: *** Time Total: ***  Visit consisted of counseling and education dealing with the complex and emotionally intense issues of symptom management and palliative care in the setting of serious and potentially life-threatening illness.Greater than 50%  of this time was spent counseling and coordinating care related to the above assessment and plan.  Signed by:  Alda Lea, AGPCNP-BC Palliative Medicine Team  Phone: [phone] Pager: [phone] Amion: Bjorn Pippin   Thank you for allowing the Palliative Medicine Team to assist in the care of this patient. Please utilize secure chat with additional questions, if there is no response within 30 minutes please call the above phone number. Palliative Medicine Team providers are available by phone from 7am to 5pm daily and can be reached through the team cell phone.  Should this patient require assistance outside of these hours, please call the patient's attending physician.

## 2021-01-10 NOTE — Progress Notes (Signed)
PROGRESS NOTE    David [name]  ZCH:[id number] DOB: 10-20-1975 DOA: 01/08/2021 PCP: Default, Provider, MD    Brief Narrative:  45 year old gentleman with history of stage IV squamous cell laryngeal cancer [name] chemotherapy and radiation, polysubstance use [name] methadone, presented to the ED with fevers, noted to be hypotensive in the cancer center.  Per admitting physician patient stated continues to abuse opioids and injected heroin in his right arm and methadone in his left arm early [name] [name] the day of admission.  Patient did state had a fever at home initially of 102 and subsequently 101.6 [name] recheck.  Patient had presented for radiation treatment found to be hypotensive with blood pressure 60/30 and sent to the ED for further evaluation.  Patient noted to be tachypneic, hypotensive [name] presentation to the ED.  Lab work noted a white count of 1.8, hemoglobin of 10.7, platelet count of 124, sodium of 128, potassium of 3.5, creatinine of 1.32.  SARS coronavirus 2 PCR was negative.  Influenza AMB negative.  Patient pancultured.  Patient placed empirically [name] IV vancomycin, IV cefepime, IV Flagyl with fluid resuscitation.  Patient given dose of IV Dilaudid and oral methadone.  Oncology consulted   Assessment & Plan:   Principal Problem:   SIRS (systemic inflammatory response syndrome) (HCC) Active Problems:   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)   Hypotension   AKI (acute kidney injury) ([place])   Hyponatremia   Leukopenia due to antineoplastic chemotherapy (HCC)   Hypomagnesemia   Hypokalemia   Pancytopenia (HCC)   Protein-calorie malnutrition, severe  #1 SIRS/hypotension, POA -Patient had presented with significant hypotension, tachycardia, tachypnea, elevated lactic acid level found likely secondary to profound dehydration in the setting of IV drug use.  Patient noted to have a new leukopenia/neutropenia with reported fevers at home concern for neutropenic  fevers. -Lactic acid level initially noted to be elevated and trending down. -Procalcitonin elevated. -Patient with history of ongoing IV drug use with heroin. -Chest x-ray negative for any infiltrate. -Urinalysis unremarkable. -Blood cultures 1/4 positive for Streptococcus species/Streptococcus pyogenes, by BCID. -Patient with a worsening neutropenia and now has been started [name] Granix per oncology. -Appreciate input by infectious disease.  Recommendations for continued penicillin.  Consideration for transitioning to oral amoxicillin if patient continues to progress well.  2.  Pancytopenia -Patient with a worsening neutropenia with ANC currently at 0.6. -Hemoglobin of 9.9. -Platelet count trending down currently at 78 from 124 [name] admission from 161 (01/01/2021). -Likely secondary to chemotherapy. -Patient being followed by oncology and started [name] Granix this morning until [place] greater than 1.5. -Recently discontinued Lovenox. -Continue antibiotics as per above  3.  Acute kidney injury -Creatinine noted at 1.32 [name] admission compared to 0.84 [name] 01/01/2021. -Felt likely secondary to prerenal azotemia. -Renal function improved with IV fluids  4.  Hypokalemia/hypomagnesemia -Replaced -Continue to follow electrolytes  5.  Hyponatremia -Likely secondary to hypovolemic hyponatremia. -Normalized with IV fluid hydration  6.  Stage IV laryngeal cancer -Being followed by Dr. Lorna Few with oncology, [name] active chemotherapy with cisplatin with last infusion 12/25/2020. -Patient undergoing radiation treatment by radial Dr. Isidore Moos and due for radiation today. -Patient denies any dysphagia, protecting airway. -Consult with SLP. -Placed [name] a dysphagia 3 diet. -Continue home regimen Decadron, viscous lidocaine solution every 6 hours as needed, Magic mouthwash. -Oncology following.  7.  Polysubstance use disorder -Patient does admit to ongoing IV drug use with heroin and  methadone. -Cessation stressed to patient. -Continue home regimen daily  methadone. -Resume half home regimen of as needed methadone for breakthrough pain. -TOC consulted  8.  Tobacco use -Nicotine patch.   DVT prophylaxis: SCD's Code Status: Full Family Communication: Pt in room, family not at bedside  Status is: Inpatient  Remains inpatient appropriate because: severity of illness requiring IV antibiotics    Consultants:  ID Oncology  Procedures:    Antimicrobials: Anti-infectives (From admission, onward)    Start     Dose/Rate Route Frequency Ordered Stop   01/09/21 1800  penicillin G potassium 12 Million Units in dextrose 5 % 500 mL continuous infusion        12 Million Units 41.7 mL/hr over 12 Hours Intravenous Every 12 hours 01/09/21 1724     01/09/21 1600  vancomycin (VANCOCIN) IVPB 1000 mg/200 mL premix  Status:  Discontinued        1,000 mg 200 mL/hr over 60 Minutes Intravenous Every 24 hours 01/08/21 1922 01/09/21 0829   01/09/21 1400  ceFEPIme (MAXIPIME) 2 g in sodium chloride 0.9 % 100 mL IVPB  Status:  Discontinued        2 g 200 mL/hr over 30 Minutes Intravenous Every 8 hours 01/09/21 0835 01/09/21 1724   01/09/21 1000  vancomycin (VANCOREADY) IVPB 750 mg/150 mL  Status:  Discontinued        750 mg 150 mL/hr over 60 Minutes Intravenous Every 12 hours 01/09/21 0829 01/09/21 1724   01/09/21 0500  metroNIDAZOLE (FLAGYL) IVPB 500 mg  Status:  Discontinued        500 mg 100 mL/hr over 60 Minutes Intravenous Every 12 hours 01/08/21 1908 01/09/21 1724   01/09/21 0300  ceFEPIme (MAXIPIME) 2 g in sodium chloride 0.9 % 100 mL IVPB  Status:  Discontinued        2 g 200 mL/hr over 30 Minutes Intravenous Every 12 hours 01/08/21 1922 01/09/21 0835   01/08/21 1445  ceFEPIme (MAXIPIME) 2 g in sodium chloride 0.9 % 100 mL IVPB        2 g 200 mL/hr over 30 Minutes Intravenous  Once 01/08/21 1435 01/08/21 1524   01/08/21 1445  metroNIDAZOLE (FLAGYL) IVPB 500 mg        500  mg 100 mL/hr over 60 Minutes Intravenous  Once 01/08/21 1435 01/08/21 1556   01/08/21 1445  vancomycin (VANCOCIN) IVPB 1000 mg/200 mL premix        1,000 mg 200 mL/hr over 60 Minutes Intravenous  Once 01/08/21 1435 01/08/21 1906       Subjective: Complaining of throat pain  Objective: Vitals:   01/09/21 2208 01/10/21 0217 01/10/21 0516 01/10/21 0835  BP: 100/64 107/61 124/80   Pulse: 83 70 79   Resp: 20 18 18    Temp: 98.6 F (37 C) 98.4 F (36.9 C) 98.4 F (36.9 C)   TempSrc: Oral Oral Oral   SpO2: 98% 100% 100% 99%  Weight:      Height:        Intake/Output Summary (Last 24 hours) at 01/10/2021 1652 Last data filed at 01/10/2021 1600 Gross per 24 hour  Intake 2202.65 ml  Output 1200 ml  Net 1002.65 ml   Filed Weights   01/08/21 1309  Weight: 53.5 kg    Examination: General exam: Awake, laying in bed, in nad Respiratory system: Normal respiratory effort, no wheezing Cardiovascular system: regular rate, s1, s2 Gastrointestinal system: Soft, nondistended, positive BS Central nervous system: CN2-12 grossly intact, strength intact Extremities: Perfused, no clubbing Skin: Normal skin turgor, no notable  skin lesions seen Psychiatry: Mood normal // no visual hallucinations   Data Reviewed: I have personally reviewed following labs and imaging studies  CBC: Recent Labs  Lab 01/08/21 1329 01/09/21 0019 01/10/21 0356  WBC 1.8* 1.0* 4.4  NEUTROABS 1.0* 0.6* 3.8  HGB 10.7* 9.9* 9.9*  HCT 31.8* 28.5* 30.3*  MCV 86.6 85.1 87.8  PLT 124* 78* 427*   Basic Metabolic Panel: Recent Labs  Lab 01/08/21 1329 01/09/21 0019 01/10/21 0356  NA 128* 133* 137  K 3.5 3.3* 4.0  CL 91* 99 101  CO2 26 28 27   GLUCOSE 91 122* 111*  BUN 21* 12 13  CREATININE 1.32* 0.79 0.55*  CALCIUM 8.4* 7.3* 8.1*  MG  --  1.4* 2.1  PHOS  --   --  3.2   GFR: Estimated Creatinine Clearance: 87.3 mL/min (A) (by C-G formula based [name] SCr of 0.55 mg/dL (L)). Liver Function Tests: Recent  Labs  Lab 01/08/21 1329 01/10/21 0356  AST 36 21  ALT 42 32  ALKPHOS 80 55  BILITOT 1.1 0.5  PROT 6.1* 5.8*  ALBUMIN 2.8* 2.3*   No results for input(s): LIPASE, AMYLASE in the last 168 hours. No results for input(s): AMMONIA in the last 168 hours. Coagulation Profile: No results for input(s): INR, PROTIME in the last 168 hours. Cardiac Enzymes: No results for input(s): CKTOTAL, CKMB, CKMBINDEX, TROPONINI in the last 168 hours. BNP (last 3 results) No results for input(s): PROBNP in the last 8760 hours. HbA1C: No results for input(s): HGBA1C in the last 72 hours. CBG: Recent Labs  Lab 01/08/21 1321  GLUCAP 98   Lipid Profile: No results for input(s): CHOL, HDL, LDLCALC, TRIG, CHOLHDL, LDLDIRECT in the last 72 hours. Thyroid Function Tests: No results for input(s): TSH, T4TOTAL, FREET4, T3FREE, THYROIDAB in the last 72 hours. Anemia Panel: No results for input(s): VITAMINB12, FOLATE, FERRITIN, TIBC, IRON, RETICCTPCT in the last 72 hours. Sepsis Labs: Recent Labs  Lab 01/08/21 1502 01/08/21 2044 01/09/21 0019  PROCALCITON  --   --  5.91  LATICACIDVEN 2.4* 4.4* 1.8    Recent Results (from the past 240 hour(s))  Blood culture (routine x 2)     Status: Abnormal (Preliminary result)   Collection Time: 01/08/21  1:30 PM   Specimen: BLOOD RIGHT FOREARM  Result Value Ref Range Status   Specimen Description   Final    BLOOD RIGHT FOREARM Performed at [facility] [address], [place], [place] [address]    Special Requests   Final    BOTTLES DRAWN AEROBIC AND ANAEROBIC Blood Culture results may not be optimal due to an inadequate volume of blood received in culture bottles Performed at [facility] [address], [place], [place] [address]    Culture  Setup Time   Final    GRAM POSITIVE COCCI IN CHAINS ANAEROBIC BOTTLE ONLY CRITICAL RESULT CALLED TO, READ BACK BY AND VERIFIED WITH: PHARMD T GREENE [id number] AT 1043 BY CM     Culture (A)  Final    GROUP A STREP (S.PYOGENES) ISOLATED SUSCEPTIBILITIES TO FOLLOW Performed at [facility], [place] [address], [place], [place] [address]    Report Status PENDING  Incomplete  Blood Culture ID Panel (Reflexed)     Status: Abnormal   Collection Time: 01/08/21  1:30 PM  Result Value Ref Range Status   Enterococcus faecalis NOT DETECTED NOT DETECTED Final   Enterococcus Faecium NOT DETECTED NOT DETECTED Final   Listeria monocytogenes  NOT DETECTED NOT DETECTED Final   Staphylococcus species NOT DETECTED NOT DETECTED Final   Staphylococcus aureus (BCID) NOT DETECTED NOT DETECTED Final   Staphylococcus epidermidis NOT DETECTED NOT DETECTED Final   Staphylococcus lugdunensis NOT DETECTED NOT DETECTED Final   Streptococcus species DETECTED (A) NOT DETECTED Final    Comment: CRITICAL RESULT CALLED TO, READ BACK BY AND VERIFIED WITH: PHARMD T GREENE [id number] AT 1043 BY CM    Streptococcus agalactiae NOT DETECTED NOT DETECTED Final   Streptococcus pneumoniae NOT DETECTED NOT DETECTED Final   Streptococcus pyogenes DETECTED (A) NOT DETECTED Final    Comment: CRITICAL RESULT CALLED TO, READ BACK BY AND VERIFIED WITH: PHARMD T GREENE [id number] AT 1043 BY CM    A.calcoaceticus-baumannii NOT DETECTED NOT DETECTED Final   Bacteroides fragilis NOT DETECTED NOT DETECTED Final   Enterobacterales NOT DETECTED NOT DETECTED Final   Enterobacter cloacae complex NOT DETECTED NOT DETECTED Final   Escherichia coli NOT DETECTED NOT DETECTED Final   Klebsiella aerogenes NOT DETECTED NOT DETECTED Final   Klebsiella oxytoca NOT DETECTED NOT DETECTED Final   Klebsiella pneumoniae NOT DETECTED NOT DETECTED Final   Proteus species NOT DETECTED NOT DETECTED Final   Salmonella species NOT DETECTED NOT DETECTED Final   Serratia marcescens NOT DETECTED NOT DETECTED Final   Haemophilus influenzae NOT DETECTED NOT DETECTED Final   Neisseria meningitidis NOT DETECTED NOT DETECTED Final   Pseudomonas  aeruginosa NOT DETECTED NOT DETECTED Final   Stenotrophomonas maltophilia NOT DETECTED NOT DETECTED Final   Candida albicans NOT DETECTED NOT DETECTED Final   Candida auris NOT DETECTED NOT DETECTED Final   Candida glabrata NOT DETECTED NOT DETECTED Final   Candida krusei NOT DETECTED NOT DETECTED Final   Candida parapsilosis NOT DETECTED NOT DETECTED Final   Candida tropicalis NOT DETECTED NOT DETECTED Final   Cryptococcus neoformans/gattii NOT DETECTED NOT DETECTED Final    Comment: Performed at [facility], [address], [place], [place] [address]  Blood culture (routine x 2)     Status: None (Preliminary result)   Collection Time: 01/08/21  1:35 PM   Specimen: BLOOD  Result Value Ref Range Status   Specimen Description   Final    BLOOD RIGHT ANTECUBITAL Performed at [facility] [address], [place], [place] [address]    Special Requests   Final    BOTTLES DRAWN AEROBIC ONLY Blood Culture results may not be optimal due to an inadequate volume of blood received in culture bottles Performed at [facility] [address], [place], [place] [address]    Culture   Final    NO GROWTH 2 DAYS Performed at [facility] [address], [place],  [address]    Report Status PENDING  Incomplete  Resp Panel by RT-PCR (Flu A&B, Covid) Nasopharyngeal Swab     Status: None   Collection Time: 01/08/21  1:49 PM   Specimen: Nasopharyngeal Swab; Nasopharyngeal(NP) swabs in vial transport medium  Result Value Ref Range Status   SARS Coronavirus 2 by RT PCR NEGATIVE NEGATIVE Final    Comment: (NOTE) SARS-CoV-2 target nucleic acids are NOT DETECTED.  The SARS-CoV-2 RNA is generally detectable in upper respiratory specimens during the acute phase of infection. The lowest concentration of SARS-CoV-2 viral copies this assay can detect is 138 copies/mL. A negative result does not preclude SARS-Cov-2 infection and should not be used as  the sole basis for treatment or other patient management decisions. A negative  result may occur with  improper specimen collection/handling, submission of specimen other than nasopharyngeal swab, presence of viral mutation(s) within the areas targeted by this assay, and inadequate number of viral copies(<138 copies/mL). A negative result must be combined with clinical observations, patient history, and epidemiological information. The expected result is Negative.  Fact Sheet for Patients:  EntrepreneurPulse.com.au  Fact Sheet for Healthcare Providers:  IncredibleEmployment.be  This test is no t yet approved or cleared by the [place] FDA and  has been authorized for detection and/or diagnosis of SARS-CoV-2 by FDA under an Emergency Use Authorization (EUA). This EUA will remain  in effect (meaning this test can be used) for the duration of the COVID-19 declaration under Section 564(b)(1) of the Act, 21 U.S.C.section 360bbb-3(b)(1), unless the authorization is terminated  or revoked sooner.       Influenza A by PCR NEGATIVE NEGATIVE Final   Influenza B by PCR NEGATIVE NEGATIVE Final    Comment: (NOTE) The Xpert Xpress SARS-CoV-2/FLU/RSV plus assay is intended as an aid in the diagnosis of influenza from Nasopharyngeal swab specimens and should not be used as a sole basis for treatment. Nasal washings and aspirates are unacceptable for Xpert Xpress SARS-CoV-2/FLU/RSV testing.  Fact Sheet for Patients: EntrepreneurPulse.com.au  Fact Sheet for Healthcare Providers: IncredibleEmployment.be  This test is not yet approved or cleared by the [place] FDA and has been authorized for detection and/or diagnosis of SARS-CoV-2 by FDA under an Emergency Use Authorization (EUA). This EUA will remain in effect (meaning this test can be used) for the duration of the COVID-19 declaration under Section 564(b)(1) of  the Act, 21 U.S.C. section 360bbb-3(b)(1), unless the authorization is terminated or revoked.  Performed at [facility], [place] [address] [place], [place] [address]      Radiology Studies: DG Swallowing Func-Speech Pathology  Result Date: 01/09/2021 Table formatting from the original result was not included. Objective Swallowing Evaluation: Type of Study: MBS-Modified Barium Swallow Study  Patient Details Name: David [name] MRN: [id number] Date of Birth: [birth year] Today's Date: 01/09/2021 Time: SLP Start Time (ACUTE ONLY): 36 -SLP Stop Time (ACUTE ONLY): 1349 SLP Time Calculation (min) (ACUTE ONLY): 34 min Past Medical History: Past Medical History: Diagnosis Date  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[place])   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 ([place])   Pneumothorax   Left lung spontaneous pneumothorax at age 72 yr  Past Surgical History: Past Surgical History: Procedure Laterality Date  chest tubes Left  HPI: David [name] is a 45 y.o. male with medical history significant for stage IV squamous cell laryngeal cancer [name] active chemotherapy and radiation, substance use disorder [name] methadone who presented to the ED for evaluation of fevers at home found to be hypotensive in the cancer center, adm with sepsis, 11/20/20 with right sided neck swelling for approx. 6 weeks. 11/20/20 CT neck revealed asymmetric soft tissue thickening of the right oropharynx, extending into the right supraglottis and glottis, and enlarged right level 2 lymph node concerning for malignancy, C3-4, C4-5, and C5-6 degenration, after trauma 2016.  Pt has been seen by SLP as an OP for initiation of dysphagia treatment.  Per RN, oral cavity erythemic and pt c/o discomfort.  He will receive 35 fractions of radiation to his Larynx and bilateral neck along with chemotherapy once every 3 weeks ([facility]).   He started [name] 12/18/20 and will complete [name] 02/08/21.    Per SLP OP note, pt was impulsive with intake but  tolerated po.  Subjective: pt awake  in flouro chair  Recommendations for follow up therapy are one component of a multi-disciplinary discharge planning process, led by the attending physician.  Recommendations may be updated based [name] patient status, additional functional criteria and insurance authorization. Assessment / Plan / Recommendation Clinical Impressions 01/09/2021 Clinical Impression Patient presents with moderately severe pharyngeal dysphagia mostly c/b decreased muscular contraction, impaired laryngeal closure/tongue base retraction likely from edema/iatrogenic effects of XRT. 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  Aforementioned deficits allow secretion aspiration, retention across all consistencies and aspiration of liquids and secretions.  Pt did not aspirate solids and retention was not severe.  To mitigate aspiration, recommend dys3/nectar diet - allowing tsps thin water after oral care, single Ice chips anytime.  Pt needs to double swallow with liquids - take SMALL boluses.  He both silently and audibly aspirates and thus following swallow precautions will be prudent for assurance of po tolerance.  Though pt states this is a new phenomenon, given his decreased awareness to overtly coughing with intake during clinical evaluation, suspect acute [name] chronic deficits from XRT.  Using teach back with video, educated pt to findings/recommendations. SLP Visit Diagnosis Dysphagia, oropharyngeal phase (R13.12) Attention and concentration deficit following -- Frontal lobe and executive function deficit following -- Impact [name] safety and function Moderate aspiration risk   Treatment Recommendations 01/09/2021 Treatment Recommendations Therapy as outlined in treatment plan below   Prognosis 01/09/2021 Prognosis for Safe Diet Advancement Good Barriers to Reach Goals Time post onset;Other (Comment);Behavior Barriers/Prognosis Comment -- Diet Recommendations 01/09/2021 SLP Diet Recommendations Dysphagia 3  (Mech soft) solids;Nectar thick liquid Liquid Administration via -- Medication Administration Whole meds with puree Compensations Slow rate;Small sips/bites Postural Changes Remain semi-upright after after feeds/meals (Comment);Seated upright at 90 degrees   Other Recommendations 01/09/2021 Recommended Consults -- Oral Care Recommendations Oral care QID Other Recommendations Have oral suction available Follow Up Recommendations Outpatient SLP Assistance recommended at discharge Frequent or constant Supervision/Assistance Functional Status Assessment Patient has had a recent decline in their functional status and demonstrates the ability to make significant improvements in function in a reasonable and predictable amount of time. Frequency and Duration  01/09/2021 Speech Therapy Frequency (ACUTE ONLY) min 2x/week Treatment Duration 2 weeks   Oral Phase 01/09/2021 Oral Phase WFL Oral - Pudding Teaspoon -- Oral - Pudding Cup -- Oral - Honey Teaspoon -- Oral - Honey Cup -- Oral - Nectar Teaspoon -- Oral - Nectar Cup -- Oral - Nectar Straw -- Oral - Thin Teaspoon -- Oral - Thin Cup -- Oral - Thin Straw -- Oral - Puree -- Oral - Mech Soft -- Oral - Regular -- Oral - Multi-Consistency -- Oral - Pill -- Oral Phase - Comment --  Pharyngeal Phase 01/09/2021 Pharyngeal Phase Impaired Pharyngeal- Pudding Teaspoon -- Pharyngeal -- Pharyngeal- Pudding Cup -- Pharyngeal -- Pharyngeal- Honey Teaspoon -- Pharyngeal -- Pharyngeal- Honey Cup -- Pharyngeal -- Pharyngeal- Nectar Teaspoon Reduced laryngeal elevation;Reduced airway/laryngeal closure;Reduced tongue base retraction;Reduced epiglottic inversion;Reduced pharyngeal peristalsis Pharyngeal Material enters airway, remains ABOVE vocal cords and not ejected out Pharyngeal- Nectar Cup Penetration/Aspiration during swallow;Reduced laryngeal elevation;Reduced airway/laryngeal closure;Reduced tongue base retraction;Significant aspiration (Amount);Penetration/Apiration after  swallow;Reduced pharyngeal peristalsis;Reduced epiglottic inversion Pharyngeal Material enters airway, passes BELOW cords without attempt by patient to eject out (silent aspiration) Pharyngeal- Nectar Straw -- Pharyngeal -- Pharyngeal- Thin Teaspoon Pharyngeal residue - valleculae;Reduced airway/laryngeal closure;Reduced laryngeal elevation;Reduced anterior laryngeal mobility;Penetration/Aspiration during swallow;Reduced epiglottic inversion;Reduced pharyngeal peristalsis;Reduced tongue base retraction Pharyngeal Material enters airway, passes BELOW cords and not ejected out despite cough attempt by  patient Pharyngeal- Thin Cup Reduced laryngeal elevation;Reduced airway/laryngeal closure;Reduced tongue base retraction;Pharyngeal residue - valleculae;Pharyngeal residue - pyriform Pharyngeal Material enters airway, passes BELOW cords without attempt by patient to eject out (silent aspiration) Pharyngeal- Thin Straw -- Pharyngeal -- Pharyngeal- Puree Pharyngeal residue - valleculae;Reduced tongue base retraction Pharyngeal Material does not enter airway Pharyngeal- Mechanical Soft Pharyngeal residue - valleculae;Reduced laryngeal elevation;Reduced epiglottic inversion;Reduced pharyngeal peristalsis;Reduced tongue base retraction Pharyngeal Material does not enter airway Pharyngeal- Regular -- Pharyngeal -- Pharyngeal- Multi-consistency -- Pharyngeal -- Pharyngeal- Pill -- Pharyngeal -- Pharyngeal Comment chin tuck worsened airway protection - as it allowed spillage of barium into open larynx; cued cough propelled barium from proximal esophagus into pharynx/trachea post-swallow- gross amount of aspiration; multiple dry swallows assisted to decrease retention; HOB posterior to less than 45* worsened airway protection as it allowed aspiration into posterior trachea; pt will propel liquid into oral cavity and re-swallow as times without awareness; pt is grossly aspirating secretions and this mixes with barium with  subsequent aspiration  Cervical Esophageal Phase  01/09/2021 Cervical Esophageal Phase Impaired Pudding Teaspoon -- Pudding Cup -- Honey Teaspoon -- Honey Cup -- Nectar Teaspoon -- Nectar Cup -- Nectar Straw -- Thin Teaspoon -- Thin Cup -- Thin Straw -- Puree -- Mechanical Soft -- Regular -- Multi-consistency -- Pill -- Cervical Esophageal Comment Upon esophageal sweep, pt appeared with residual throughout - suspect esophageal dysphagia component Kathleen Lime, MS [facility] SLP Acute Rehab Services Office [phone] Pager [phone] Macario Golds 01/09/2021, 3:10 PM                      Scheduled Meds:  (feeding supplement) PROSource Plus  30 mL Oral BID BM   clotrimazole  10 mg Oral 5 X Daily   dexamethasone  4 mg Oral BID WC   dronabinol  2.5 mg Oral BID AC   feeding supplement  237 mL Oral TID BM   magic mouthwash  15 mL Oral QID   mouth rinse  15 mL Mouth Rinse BID   methadone  60 mg Oral Daily   mometasone-formoterol  2 puff Inhalation BID   multivitamin with minerals  1 tablet Oral Daily   nicotine  21 mg Transdermal Daily   nystatin  5 mL Oral QID   pantoprazole  40 mg Oral Daily   potassium chloride SA  20 mEq Oral Daily   umeclidinium bromide  1 puff Inhalation Daily   Continuous Infusions:  sodium chloride 125 mL/hr at 01/10/21 0559   penicillin g continuous IV infusion 12 Million Units (01/10/21 1037)     LOS: 1 day   Marylu Lund, MD [facility] Pager [name]  If 7PM-7AM, please contact night-coverage 01/10/2021, 4:52 PM

## 2021-01-10 NOTE — Progress Notes (Signed)
Initial Nutrition Assessment  DOCUMENTATION CODES:   Severe malnutrition in context of chronic illness  INTERVENTION:  - will order Ensure Enlive TID, each supplement provides 350 kcal and 20 grams of protein - will order 30 ml Prosource Plus BID, each supplement provides 100 kcal and 15 grams protein.  - will order 1 tablet multivitamin with minerals/day. - will follow-up 12/15 for day #1 results of Calorie Count.    NUTRITION DIAGNOSIS:   Severe Malnutrition related to chronic illness, cancer and cancer related treatments as evidenced by severe fat depletion, severe muscle depletion.  GOAL:   Patient will meet greater than or equal to 90% of their needs  MONITOR:   PO intake, Supplement acceptance, Labs, Weight trends  REASON FOR ASSESSMENT:   Malnutrition Screening Tool  ASSESSMENT:   45 y.o. 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 He presented the ED due to fevers at home and hypotension at [facility]. In the ED he reported that he continues to abuse opioids and that he injected heroin in R arm and methadone in L arm earlier in the day on date of presentation.  Able to communicate with David [name] RDs before and after seeing patient today.   Patient laying in bed with no visitors at the time of RD visit.   Patient was sleeping but easily awoke to name call x1. Patient was tearful throughout much of RD visit. He expresses frustration with cancer diagnosis, side effects of cancer, and side effects of cancer treatment. This includes severe throat pain with request for something to relieve pain; reports that magic mouthwash has not been helpful.  RD able to communicate with RN who shares that patient would be going to XRT later this afternoon.   Patient confirms that he is currently living with his mom. He shares that throat pain  is a constant struggle for him. He has had persistent very thick secretions which he describes as glue-like. He denies issues with clearing secretions. He reports lack of taste sensation which has been upsetting for him and has made eating unenjoyable and feel like a chore.   Without any mention of it on the part of RD, patient states "I do not want a feeding tube". When he was seen by David [name] on 12/29/20 they had discussed PEG and at that time patient was open to PEG if it became necessary in the future.  Weight on 12/12 was documented as 118 lb and weight on 12/08/20 was 129 lb. This indicates 11 lb weight loss (8.5% body weight) in the past 1 month; significant for time frame.    Labs reviewed; creatinine: 0.55 mg/dl, Ca: 8.1 mg/dl.  Medications reviewed; 2.5 mg oral marinol BID, 5 ml mycostatin QID, 40 mg oral protonix/day, 20 mEq Klor-Con/day.  IVF; NS @ 125 ml/hr.     NUTRITION - FOCUSED PHYSICAL EXAM:  Flowsheet Row Most Recent Value  Orbital Region Moderate depletion  Upper Arm Region Severe depletion  Thoracic and Lumbar Region Unable to assess  Buccal Region Severe depletion  Temple Region Moderate depletion  Clavicle Bone Region Severe depletion  Clavicle and Acromion Bone Region Severe depletion  Scapular Bone Region Moderate depletion  Dorsal Hand Severe depletion  Patellar Region Unable to assess  Anterior Thigh Region Unable to assess  Posterior Calf Region Unable to assess  Edema (RD Assessment) Unable to assess  Hair Reviewed  Eyes Reviewed  Mouth Unable to assess  Skin Reviewed  Nails Reviewed       Diet Order:   Diet Order             DIET DYS 3 Room service appropriate? Yes; Fluid consistency: Nectar Thick  Diet effective now                   EDUCATION NEEDS:   No education needs have been identified at this time  Skin:  Skin Assessment: Reviewed RN Assessment  Last BM:  12/13  Height:   Ht Readings from Last 1 Encounters:   01/08/21 5\' 11"  (1.803 m)    Weight:   Wt Readings from Last 1 Encounters:  01/08/21 53.5 kg    Estimated Nutritional Needs:  Kcal:  2250-2500 kcal Protein:  115-130 grams Fluid:  >/= 2.3 L/day     David Matin, MS, RD, LDN, CNSC Inpatient Clinical Dietitian RD pager # available in [facility]  After hours/weekend pager # available in [facility]

## 2021-01-10 NOTE — Progress Notes (Signed)
Speech Language Pathology Treatment: Dysphagia  Patient Details Name: David [name] MRN: [id number] DOB: [birth year] Today's Date: 01/10/2021 Time: 1350-1410 SLP Time Calculation (min) (ACUTE ONLY): 20 min  Assessment / Plan / Recommendation Clinical Impression  Pt with excellent understanding of his dysphagia/odnophagia and agreeable to diet modifications currently as he reports he is coughing less with nectar liquids than thin.  He has ongong complaints of odynophagia - and dysgeusia -as well as secretions likely related to his chemoradiation treatment for his cancer.    Pt's goal is to avoid feeding tube an obtain nutrition via po.  He desires oral suction set up and advised he was aware he needed to brush his gums/tongue due to ongoing secretion aspiration but states she does not have items readily available.  SLP obtained, set up, demonstrated use of oral suction wth pt - using return demonstration.   Pt does state that he "feels like he is drinking soup" but denies not wanting to drink it as it increases his comfort with swallowing.  Advised if he will not drink thicker liquids, his risk of dehydration will be paramount - and if he decides to stop thickener = would recommend he drink plain water with his meals.   At this time, pt is not opposed to thickened drinks.  Will continue to follow for dysphagia management, contacted MD with request for lidocaine per pt.    HPI HPI: David [name] is a 45 y.o. 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 6 weeks. 11/20/20 CT neck revealed asymmetric soft tissue thickening of the right oropharynx, extending into the right supraglottis and glottis, and enlarged right  level 2 lymph node concerning for malignancy, C3-4, C4-5, and C5-6 degenration, after trauma 2016.  Pt has been seen by SLP as an OP for initiation of dysphagia treatment.  Per RN, oral cavity erythemic and pt c/o discomfort.  He will receive 35 fractions of radiation to his Larynx and bilateral neck along with chemotherapy once every 3 weeks (Mohamed).   He started on 12/18/20 and will complete on 02/08/21.    Per SLP OP note, pt was impulsive with intake but tolerated po.      SLP Plan  Continue with current plan of care      Recommendations for follow up therapy are one component of a multi-disciplinary discharge planning process, led by the attending physician.  Recommendations may be updated based on patient status, additional functional criteria and insurance authorization.    Recommendations  Diet recommendations: Dysphagia 3 (mechanical soft);Nectar-thick liquid (thin water via tsp ok between meals, ice chips ok) Medication Administration: Whole meds with puree (crush if large and not contraindicated) Compensations: Slow rate;Small sips/bites (multiple swallows with liquids, cough and expectorate as needed to clear secretions) Postural Changes and/or Swallow Maneuvers: Seated upright 90 degrees;Upright 30-60 min after meal                Oral Care Recommendations: Oral care BID Follow Up Recommendations: Outpatient SLP Assistance recommended at discharge: Frequent or constant Supervision/Assistance SLP Visit Diagnosis: Dysphagia, oropharyngeal phase (R13.12) Plan: Continue with current plan of care           Vertell Limber, [place] Office [phone]  Pager [phone]     01/10/2021, 2:54 PM

## 2021-01-10 NOTE — Progress Notes (Signed)
Speech Language Pathology Treatment: Dysphagia  Patient Details Name: David [name] MRN: [id number] DOB: 05/10/1975 Today's Date: 01/10/2021 Time: 0102-7253 SLP Time Calculation (min) (ACUTE ONLY): 14 min  Assessment / Plan / Recommendation Clinical Impression  Pt's mother arrived following session and desired education re: pt's care plan, swallowing, etc.    Thus, SLP reviewed MBS findings with pt's mom, [name], using teach back.  In addition, educated [name] that pt is on thickened liquids at this time - and all drinks except tsps of thin water between meals should be thick.  Demonstrated thickening pt's cherrylemonade that the mother brought to pt = demonstrating viscocity expected.   Pt and his mother reported understanding to the current diet regiment and precautions.  Using clinical reasoning while reviewing MBS flouro loops, mother educated and agreeable to plan. Will order packet of thickener for pt.  Of note, patient became frustrated with his mother at end of the session - using raised voice and stating she was going to make his throat hurt more.  Psycho-social issues appears present that may negatively impact pt's progression.  RN made aware.    HPI HPI: David [name] is a 45 y.o. 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 6 weeks. 11/20/20 CT neck revealed asymmetric soft tissue thickening of the right oropharynx, extending into the right supraglottis and glottis, and enlarged right level 2 lymph node concerning for malignancy, C3-4, C4-5, and C5-6 degenration, after trauma 2016.  Pt has been seen by SLP as an OP for initiation of dysphagia treatment.  Per RN, oral cavity erythemic and pt c/o  discomfort.  He will receive 35 fractions of radiation to his Larynx and bilateral neck along with chemotherapy once every 3 weeks (Mohamed).   He started on 12/18/20 and will complete on 02/08/21.    Per SLP OP note, pt was impulsive with intake but tolerated po.      SLP Plan  Continue with current plan of care      Recommendations for follow up therapy are one component of a multi-disciplinary discharge planning process, led by the attending physician.  Recommendations may be updated based on patient status, additional functional criteria and insurance authorization.    Recommendations  Diet recommendations: Dysphagia 3 (mechanical soft);Thin liquid Medication Administration: Whole meds with puree (crush if large and not contraindicated) Supervision: Patient able to self feed Compensations: Slow rate;Small sips/bites (multiple swallows with liquids, cough and expectorate as needed to clear secretions) Postural Changes and/or Swallow Maneuvers: Seated upright 90 degrees;Upright 30-60 min after meal                Oral Care Recommendations: Oral care BID Follow Up Recommendations: Outpatient SLP Assistance recommended at discharge: Frequent or constant Supervision/Assistance SLP Visit Diagnosis: Dysphagia, oropharyngeal phase (R13.12) Plan: Continue with current plan of care           Vertell Limber, [place] SLP Acute Rehab Services Office [phone] Pager [phone]    01/10/2021, 3:07 PM

## 2021-01-10 NOTE — Progress Notes (Signed)
[place] for Infectious Disease  Date of Admission:  01/08/2021     Total days of antibiotics 3         ASSESSMENT:  David [name] has remained afebrile and appears to be progressing well. Source of infection suspected to be skin/drug use. Continues to be no evidence of toxic symptoms. Continue current dose of penicillin. Likely transition to high dose amoxicillin to complete 10-14 days of total treatment. Remaining medical and supportive care per primary team.   PLAN:  Continue Penicillin. Consider transition to oral Amoxicillin if continues to progress well.  Remaining medical and supportive care per primary team.    Principal Problem:   SIRS (systemic inflammatory response syndrome) (HCC) Active Problems:   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)   Hypotension   AKI (acute kidney injury) (HCC)   Hyponatremia   Leukopenia due to antineoplastic chemotherapy (HCC)   Hypomagnesemia   Hypokalemia   Pancytopenia (HCC)    clotrimazole  10 mg Oral 5 X Daily   dexamethasone  4 mg Oral BID WC   dronabinol  2.5 mg Oral BID AC   magic mouthwash  15 mL Oral QID   mouth rinse  15 mL Mouth Rinse BID   methadone  60 mg Oral Daily   mometasone-formoterol  2 puff Inhalation BID   nicotine  21 mg Transdermal Daily   nystatin  5 mL Oral QID   pantoprazole  40 mg Oral Daily   potassium chloride SA  20 mEq Oral Daily   umeclidinium bromide  1 puff Inhalation Daily    SUBJECTIVE:  Afebrile overnight with no acute events. Just woke up and wanting his food heated and some pain medication.   No Known Allergies   Review of Systems: Review of Systems  Constitutional:  Negative for chills, fever and weight loss.  Respiratory:  Negative for cough, shortness of breath and wheezing.   Cardiovascular:  Negative for chest pain and leg swelling.  Gastrointestinal:  Negative for abdominal pain, constipation, diarrhea, nausea and vomiting.  Skin:  Negative for rash.      OBJECTIVE: Vitals:   01/09/21 2208 01/10/21 0217 01/10/21 0516 01/10/21 0835  BP: 100/64 107/61 124/80   Pulse: 83 70 79   Resp: 20 18 18    Temp: 98.6 F (37 C) 98.4 F (36.9 C) 98.4 F (36.9 C)   TempSrc: Oral Oral Oral   SpO2: 98% 100% 100% 99%  Weight:      Height:       Body mass index is 16.46 kg/m.  Physical Exam Constitutional:      General: He is not in acute distress.    Appearance: He is well-developed.  Cardiovascular:     Rate and Rhythm: Normal rate and regular rhythm.     Heart sounds: Normal heart sounds.  Pulmonary:     Effort: Pulmonary effort is normal.     Breath sounds: Normal breath sounds.  Skin:    General: Skin is warm and dry.  Neurological:     Mental Status: He is alert.  Psychiatric:        Mood and Affect: Mood normal.    Lab Results Lab Results  Component Value Date   WBC 4.4 01/10/2021   HGB 9.9 (L) 01/10/2021   HCT 30.3 (L) 01/10/2021   MCV 87.8 01/10/2021   PLT 104 (L) 01/10/2021    Lab Results  Component Value Date   CREATININE 0.55 (L) 01/10/2021   BUN  13 01/10/2021   NA 137 01/10/2021   K 4.0 01/10/2021   CL 101 01/10/2021   CO2 27 01/10/2021    Lab Results  Component Value Date   ALT 32 01/10/2021   AST 21 01/10/2021   ALKPHOS 55 01/10/2021   BILITOT 0.5 01/10/2021     Microbiology: Recent Results (from the past 240 hour(s))  Blood culture (routine x 2)     Status: Abnormal (Preliminary result)   Collection Time: 01/08/21  1:30 PM   Specimen: BLOOD RIGHT FOREARM  Result Value Ref Range Status   Specimen Description   Final    BLOOD RIGHT FOREARM Performed at [facility] [address] [place], [place] [address]    Special Requests   Final    BOTTLES DRAWN AEROBIC AND ANAEROBIC Blood Culture results may not be optimal due to an inadequate volume of blood received in culture bottles Performed at [facility] [address], [place], [place] [address]     Culture  Setup Time   Final    GRAM POSITIVE COCCI IN CHAINS ANAEROBIC BOTTLE ONLY CRITICAL RESULT CALLED TO, READ BACK BY AND VERIFIED WITH: PHARMD T GREENE [id number] AT 1043 BY CM    Culture (A)  Final    GROUP A STREP (S.PYOGENES) ISOLATED SUSCEPTIBILITIES TO FOLLOW Performed at [facility], [place] [address] [place], [place] [address]    Report Status PENDING  Incomplete  Blood Culture ID Panel (Reflexed)     Status: Abnormal   Collection Time: 01/08/21  1:30 PM  Result Value Ref Range Status   Enterococcus faecalis NOT DETECTED NOT DETECTED Final   Enterococcus Faecium NOT DETECTED NOT DETECTED Final   Listeria monocytogenes NOT DETECTED NOT DETECTED Final   Staphylococcus species NOT DETECTED NOT DETECTED Final   Staphylococcus aureus (BCID) NOT DETECTED NOT DETECTED Final   Staphylococcus epidermidis NOT DETECTED NOT DETECTED Final   Staphylococcus lugdunensis NOT DETECTED NOT DETECTED Final   Streptococcus species DETECTED (A) NOT DETECTED Final    Comment: CRITICAL RESULT CALLED TO, READ BACK BY AND VERIFIED WITH: PHARMD T GREENE [id number] AT 1043 BY CM    Streptococcus agalactiae NOT DETECTED NOT DETECTED Final   Streptococcus pneumoniae NOT DETECTED NOT DETECTED Final   Streptococcus pyogenes DETECTED (A) NOT DETECTED Final    Comment: CRITICAL RESULT CALLED TO, READ BACK BY AND VERIFIED WITH: PHARMD T GREENE [id number] AT 1043 BY CM    A.calcoaceticus-baumannii NOT DETECTED NOT DETECTED Final   Bacteroides fragilis NOT DETECTED NOT DETECTED Final   Enterobacterales NOT DETECTED NOT DETECTED Final   Enterobacter cloacae complex NOT DETECTED NOT DETECTED Final   Escherichia coli NOT DETECTED NOT DETECTED Final   Klebsiella aerogenes NOT DETECTED NOT DETECTED Final   Klebsiella oxytoca NOT DETECTED NOT DETECTED Final   Klebsiella pneumoniae NOT DETECTED NOT DETECTED Final   Proteus species NOT DETECTED NOT DETECTED Final   Salmonella species NOT DETECTED NOT DETECTED Final    Serratia marcescens NOT DETECTED NOT DETECTED Final   Haemophilus influenzae NOT DETECTED NOT DETECTED Final   Neisseria meningitidis NOT DETECTED NOT DETECTED Final   Pseudomonas aeruginosa NOT DETECTED NOT DETECTED Final   Stenotrophomonas maltophilia NOT DETECTED NOT DETECTED Final   Candida albicans NOT DETECTED NOT DETECTED Final   Candida auris NOT DETECTED NOT DETECTED Final   Candida glabrata NOT DETECTED NOT DETECTED Final   Candida krusei NOT DETECTED NOT DETECTED Final   Candida parapsilosis NOT DETECTED NOT DETECTED  Final   Candida tropicalis NOT DETECTED NOT DETECTED Final   Cryptococcus neoformans/gattii NOT DETECTED NOT DETECTED Final    Comment: Performed at [facility], [place] [address], [place], [place] [address]  Blood culture (routine x 2)     Status: None (Preliminary result)   Collection Time: 01/08/21  1:35 PM   Specimen: BLOOD  Result Value Ref Range Status   Specimen Description   Final    BLOOD RIGHT ANTECUBITAL Performed at [facility] [address], [place], [place] [address]    Special Requests   Final    BOTTLES DRAWN AEROBIC ONLY Blood Culture results may not be optimal due to an inadequate volume of blood received in culture bottles Performed at [facility] [address], [place], [place] [address]    Culture   Final    NO GROWTH 2 DAYS Performed at [facility] [address] [place], [place] [address]    Report Status PENDING  Incomplete  Resp Panel by RT-PCR (Flu A&B, Covid) Nasopharyngeal Swab     Status: None   Collection Time: 01/08/21  1:49 PM   Specimen: Nasopharyngeal Swab; Nasopharyngeal(NP) swabs in vial transport medium  Result Value Ref Range Status   SARS Coronavirus 2 by RT PCR NEGATIVE NEGATIVE Final    Comment: (NOTE) SARS-CoV-2 target nucleic acids are NOT DETECTED.  The SARS-CoV-2 RNA is generally detectable in upper respiratory specimens during the acute phase of  infection. The lowest concentration of SARS-CoV-2 viral copies this assay can detect is 138 copies/mL. A negative result does not preclude SARS-Cov-2 infection and should not be used as the sole basis for treatment or other patient management decisions. A negative result may occur with  improper specimen collection/handling, submission of specimen other than nasopharyngeal swab, presence of viral mutation(s) within the areas targeted by this assay, and inadequate number of viral copies(<138 copies/mL). A negative result must be combined with clinical observations, patient history, and epidemiological information. The expected result is Negative.  Fact Sheet for Patients:  EntrepreneurPulse.com.au  Fact Sheet for Healthcare Providers:  IncredibleEmployment.be  This test is no t yet approved or cleared by the [place] FDA and  has been authorized for detection and/or diagnosis of SARS-CoV-2 by FDA under an Emergency Use Authorization (EUA). This EUA will remain  in effect (meaning this test can be used) for the duration of the COVID-19 declaration under Section 564(b)(1) of the Act, 21 U.S.C.section 360bbb-3(b)(1), unless the authorization is terminated  or revoked sooner.       Influenza A by PCR NEGATIVE NEGATIVE Final   Influenza B by PCR NEGATIVE NEGATIVE Final    Comment: (NOTE) The Xpert Xpress SARS-CoV-2/FLU/RSV plus assay is intended as an aid in the diagnosis of influenza from Nasopharyngeal swab specimens and should not be used as a sole basis for treatment. Nasal washings and aspirates are unacceptable for Xpert Xpress SARS-CoV-2/FLU/RSV testing.  Fact Sheet for Patients: EntrepreneurPulse.com.au  Fact Sheet for Healthcare Providers: IncredibleEmployment.be  This test is not yet approved or cleared by the [place] FDA and has been authorized for detection and/or diagnosis of SARS-CoV-2  by FDA under an Emergency Use Authorization (EUA). This EUA will remain in effect (meaning this test can be used) for the duration of the COVID-19 declaration under Section 564(b)(1) of the Act, 21 U.S.C. section 360bbb-3(b)(1), unless the authorization is terminated or revoked.  Performed at [facility], [place] [address] [place], [place]  [place], [place] for Infectious [facility]  01/10/2021  11:09 AM

## 2021-01-11 ENCOUNTER — Ambulatory Visit: Payer: Medicaid Other

## 2021-01-11 ENCOUNTER — Inpatient Hospital Stay: Payer: Medicaid Other | Admitting: Nutrition

## 2021-01-11 ENCOUNTER — Telehealth: Payer: Self-pay

## 2021-01-11 ENCOUNTER — Ambulatory Visit
Admission: RE | Admit: 2021-01-11 | Discharge: 2021-01-11 | Disposition: A | Discharge status: Home / Self Care | Payer: Medicaid Other | Source: Ambulatory Visit | Attending: Radiation Oncology | Admitting: Radiation Oncology

## 2021-01-11 LAB — COMPREHENSIVE METABOLIC PANEL
ALT: 23 U/L (ref 0–44)
AST: 14 U/L — ABNORMAL LOW (ref 15–41)
Albumin: 2.3 g/dL — ABNORMAL LOW (ref 3.5–5.0)
Alkaline Phosphatase: 56 U/L (ref 38–126)
Anion gap: 7 (ref 5–15)
BUN: 17 mg/dL (ref 6–20)
CO2: 27 mmol/L (ref 22–32)
Calcium: 8.2 mg/dL — ABNORMAL LOW (ref 8.9–10.3)
Chloride: 102 mmol/L (ref 98–111)
Creatinine, Ser: 0.62 mg/dL (ref 0.61–1.24)
GFR, Estimated: >60 mL/min (ref >60)
Glucose, Bld: 172 mg/dL — ABNORMAL HIGH (ref 70–99)
Potassium: 4.1 mmol/L (ref 3.5–5.1)
Sodium: 136 mmol/L (ref 135–145)
Total Bilirubin: 0.4 mg/dL (ref 0.3–1.2)
Total Protein: 5.5 g/dL — ABNORMAL LOW (ref 6.5–8.1)

## 2021-01-11 LAB — CBC
HCT: 28.8 % — ABNORMAL LOW (ref 39.0–52.0)
Hemoglobin: 9.5 g/dL — ABNORMAL LOW (ref 13.0–17.0)
MCH: 29.4 pg (ref 26.0–34.0)
MCHC: 33 g/dL (ref 30.0–36.0)
MCV: 89.2 fL (ref 80.0–100.0)
Platelets: 103 10*3/uL — ABNORMAL LOW (ref 150–400)
RBC: 3.23 MIL/uL — ABNORMAL LOW (ref 4.22–5.81)
RDW: 15.9 % — ABNORMAL HIGH (ref 11.5–15.5)
WBC: 3.9 10*3/uL — ABNORMAL LOW (ref 4.0–10.5)
nRBC: 0 % (ref 0.0–0.2)

## 2021-01-11 LAB — CULTURE, BLOOD (ROUTINE X 2)

## 2021-01-11 MED ORDER — AMOXICILLIN 500 MG PO TABS: 1000.0000 mg | ORAL_TABLET | Freq: Three times a day (TID) | ORAL | 0 refills | Status: AC

## 2021-01-11 MED ORDER — POLYETHYLENE GLYCOL 3350 17 G PO PACK
17.0000 g | PACK | Freq: Every day | ORAL | Status: DC
  Administered 2021-01-11: 17 g via ORAL
  Filled 2021-01-11: qty 1

## 2021-01-11 NOTE — TOC Progression Note (Signed)
Transition of Care [facility]) - Progression Note    Patient Details  Name: David [name] MRN: [id number] Date of Birth: 12-13-1975  Transition of Care [facility]) CM/SW Contact  Purcell Mouton, RN Phone Number: 01/11/2021, 12:13 PM  Clinical Narrative:    At present time pt will not qualify for SNF.   Expected Discharge Plan: Homeless Shelter Barriers to Discharge: No Barriers Identified  Expected Discharge Plan and Services Expected Discharge Plan: Homeless Shelter       Living arrangements for the past 2 months: Homeless Shelter                                       Social Determinants of Health (SDOH) Interventions    Readmission Risk Interventions No flowsheet data found.

## 2021-01-11 NOTE — Progress Notes (Signed)
NUTRITION NOTE  At the time of first attempted visit, patient was using the bathroom and mom stepped out of his room. RD was able to meet mom in the hallway briefly before she left the floor. She reports that she arrived ~1 hour prior.   Able to see patient at time of second attempted visit. He had recently received lunch tray and began eating as RD entered the room.   SLP was able to work with patient and talk with him and his mom yesterday. They both expressed to RD today that this was beneficial.   Patient ate 75% of breakfast and 100% of dinner yesterday (total of 1113 kcal and 38 grams protein). Flow sheet documentation indicates he ate 75% of breakfast this AM, but in [facility] it indicates that patient missed breakfast meal.   He has accepted both packets of Prosource Plus offered so far (each provides 100 kcal and 15 grams protein).   He has accepted 2 of 3 bottles of Ensure Enlive offered so far (each provides 350 kcal and 20 grams protein).   Calorie Count envelope not hung on door or anywhere in the room.      Jarome Matin, MS, RD, LDN, CNSC Inpatient Clinical Dietitian RD pager # available in [place]  After hours/weekend pager # available in [facility]

## 2021-01-11 NOTE — Progress Notes (Signed)
Pt discharged to home, pt became upset with his his home prescriptions. Pt and mother are yelling and shouting wanting answers concerning why he does not have prescriptions for Methadone 60 mg.. reached out to Drs. Lidia Collum and NP Lexine Baton. As I was attempting to explain to the pt and mother, they proceeded to yell and scream at [place], then at each other, explained to pt according to notes in computer..per orders NP notes, "As per signed contract with clear understanding by patient and his mother he would no longer be able to continue to receive support from Palliative in regards to symptom management if he engage in other illicit drug use. Patient will be formally discharged from outpatient palliative support here at [facility] in regards to symptom management. He may continue to follow-up with [facility] if he desires." Writer set firm limits, explained the discharge instructions to the patient. Pt and mother listened to the instruction and pt left. Instructions explained to pt and mother with prescription update for home antibiotic. Acknowledged understanding of antibiotic prescription, however, continues to contest the Methadone prescription. I told him he will need to follow up with [facility] for further question if he chose. SRP,RN

## 2021-01-11 NOTE — Progress Notes (Signed)
In addition, pt and mother were both disrespectful and belligerent to Probation officer and explaining the discharge instructions challenging. SRP, RN

## 2021-01-11 NOTE — Telephone Encounter (Signed)
Call to [employer] to begin Prior Authorization for Dronabinol 2.5mg  capsules. Authorization was approved from 01/11/2021 through 01/06/2022. PA # [id number]. Patient to be notified by Nurse Practioner.

## 2021-01-11 NOTE — Plan of Care (Signed)
°  Problem: Education: Goal: Knowledge of General Education information will improve Description: Including pain rating scale, medication(s)/side effects and non-pharmacologic comfort measures Outcome: Progressing   Problem: Health Behavior/Discharge Planning: Goal: Ability to manage health-related needs will improve Outcome: Progressing   Problem: Clinical Measurements: Goal: Cardiovascular complication will be avoided Outcome: Progressing   Problem: Activity: Goal: Risk for activity intolerance will decrease Outcome: Progressing   Problem: Coping: Goal: Level of anxiety will decrease Outcome: Progressing   Problem: Elimination: Goal: Will not experience complications related to urinary retention Outcome: Progressing   Problem: Pain Managment: Goal: General experience of comfort will improve Outcome: Progressing   Problem: Safety: Goal: Ability to remain free from injury will improve Outcome: Progressing   Problem: Skin Integrity: Goal: Risk for impaired skin integrity will decrease Outcome: Progressing   Problem: Fluid Volume: Goal: Hemodynamic stability will improve Outcome: Progressing   Problem: Clinical Measurements: Goal: Diagnostic test results will improve Outcome: Progressing Goal: Signs and symptoms of infection will decrease Outcome: Progressing   Problem: Respiratory: Goal: Ability to maintain adequate ventilation will improve Outcome: Progressing   Problem: Clinical Measurements: Goal: Ability to maintain clinical measurements within normal limits will improve Outcome: Not Progressing Goal: Will remain free from infection Outcome: Not Progressing Goal: Diagnostic test results will improve Outcome: Not Progressing Goal: Respiratory complications will improve Outcome: Not Progressing   Problem: Nutrition: Goal: Adequate nutrition will be maintained Outcome: Not Progressing   Problem: Elimination: Goal: Will not experience complications  related to bowel motility Outcome: Not Progressing

## 2021-01-11 NOTE — Plan of Care (Signed)

## 2021-01-11 NOTE — Discharge Summary (Signed)
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  Per admitting physician patient stated continues to abuse opioids and injected heroin in his right arm and methadone in his left arm early on on the day of admission.  Patient did state had a fever at home initially of 102 and subsequently 101.6 on recheck.  Patient had presented for radiation treatment found to be hypotensive with blood pressure 60/30 and sent to the ED for further evaluation.  Patient noted to be tachypneic, hypotensive on presentation to the ED.  Lab work noted a white count of 1.8, hemoglobin of 10.7, platelet count of 124, sodium of 128, potassium of 3.5, creatinine of 1.32.  SARS coronavirus 2 PCR was negative.  Influenza AMB negative.  Patient pancultured.  Patient placed empirically on IV vancomycin, IV cefepime, IV Flagyl with fluid resuscitation.  Patient given dose of IV Dilaudid and oral methadone.  Oncology consulted   Discharge Diagnoses:  Principal Problem:   SIRS (systemic inflammatory response syndrome) (HCC) Active Problems:   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)   Hypotension   AKI (acute kidney injury) ([place])   Hyponatremia   Leukopenia due to antineoplastic chemotherapy (HCC)   Hypomagnesemia   Hypokalemia   Pancytopenia (HCC)   Protein-calorie  malnutrition, severe  #1 SIRS/hypotension, POA -Patient had presented with significant hypotension, tachycardia, tachypnea, elevated lactic acid level found likely secondary to profound dehydration in the setting of IV drug use.  Patient noted to have a new leukopenia/neutropenia with reported fevers at home concern for neutropenic fevers. -Lactic acid level initially noted to be elevated and trending down. -Procalcitonin elevated. -Patient with history of ongoing IV drug use with heroin. -Chest x-ray negative for any infiltrate. -Urinalysis unremarkable. -Blood cultures 1/4 positive for Streptococcus species/Streptococcus pyogenes, by BCID. -Patient with a worsening neutropenia and now has been started on Granix per oncology. -Appreciate input by infectious disease.  Recommendations for continued penicillin while in hospital. ID recs to transition to high dose amoxicillin at time of d/c for 10 days  2.  Pancytopenia -Likely secondary to chemotherapy. -Patient being followed by oncology and started on Granix  -Recently discontinued Lovenox. -Continue antibiotics as per above  3.  Acute kidney injury -Creatinine noted at 1.32 on admission compared to 0.84 on 01/01/2021. -Felt likely secondary to prerenal azotemia. -Renal function improved with IV fluids  4.  Hypokalemia/hypomagnesemia -Replaced  5.  Hyponatremia -Likely secondary to hypovolemic hyponatremia. -Normalized with IV fluid hydration  6.  Stage IV laryngeal cancer -Being followed by Dr. Lorna Few with oncology, on active chemotherapy with cisplatin with last infusion 12/25/2020. -Patient undergoing radiation treatment by radial Dr. Isidore Moos -Patient denies any dysphagia, protecting airway. -Placed on a dysphagia 3 diet per SLP recs -Continue home regimen Decadron, viscous lidocaine solution every 6 hours as needed, Magic mouthwash. -Oncology following.  7.  Polysubstance use disorder -Patient does admit to  ongoing IV drug use with heroin and methadone. -Cessation stressed  to patient. -Palliative Care documentation noted. Pt had violated pain contract, thus Palliative Care no longer will prescribe controlled substances.  Palliative Care as recommended f/u with [facility] for future pain management  8.  Tobacco use -Nicotine patch.    Discharge Instructions   Allergies as of 01/11/2021   No Known Allergies      Medication List     STOP taking these medications    potassium chloride SA 20 MEQ tablet Commonly known as: KLOR-CON M       TAKE these medications    amoxicillin 500 MG tablet Commonly known as: AMOXIL Take 2 tablets (1,000 mg total) by mouth every 8 (eight) hours for 10 days.   clotrimazole 10 MG troche Commonly known as: MYCELEX Take 1 tablet (10 mg total) by mouth 5 (five) times daily. Suck on these slowly.   dexamethasone 4 MG tablet Commonly known as: DECADRON Take 1 tablet PO BID; take with food.   dronabinol 2.5 MG capsule Commonly known as: MARINOL Take 2.5 mg by mouth 2 (two) times daily before a meal.   fluticasone 50 MCG/ACT nasal spray Commonly known as: FLONASE Place 2 sprays into both nostrils daily.   ibuprofen 200 MG tablet Commonly known as: ADVIL Take 600 mg by mouth every 6 (six) hours as needed for moderate pain.   lidocaine 2 % solution Commonly known as: XYLOCAINE Patient: Mix 1part 2% viscous lidocaine, 1part H20. Swish & swallow 53mL of diluted mixture, 4min before meals and at bedtime, up to QID prn soreness. What changed:  how much to take when to take this   methadone 10 MG/ML solution Commonly known as: DOLOPHINE Take 6 mLs (60 mg total) by mouth daily. What changed:  how much to take when to take this reasons to take this   nystatin 100000 UNIT/ML suspension Commonly known as: MYCOSTATIN Take 5 mLs by mouth 4 (four) times daily. Swish & Swallow   omeprazole 20 MG capsule Commonly known as: PRILOSEC Take 1  capsule (20 mg total) by mouth daily.   ondansetron 8 MG tablet Commonly known as: ZOFRAN Take 1 tablet (8 mg total) by mouth every 8 (eight) hours as needed for nausea or vomiting. Starting day 3 after chemotherapy   prochlorperazine 10 MG tablet Commonly known as: COMPAZINE Take 1 tablet (10 mg total) by mouth every 6 (six) hours as needed. What changed: reasons to take this        Follow-up Information     Follow up with PCP in 1-2 weeks Follow up.   Why: Hospital follow up        Curt Bears, MD Follow up.   Specialty: Oncology Why: as scheduled Contact information: [place] [address] [phone]         [name], MD Follow up.   Specialty: Radiation Oncology Why: as scheduled Contact information: [address] [place] [address] [phone]                No Known Allergies  Consultations: ID Oncology  Procedures/Studies: DG Chest Port 1 View  Result Date: 01/08/2021 CLINICAL DATA:  Hypotension EXAM: PORTABLE CHEST 1 VIEW COMPARISON:  02/18/2019 FINDINGS: The heart size and mediastinal contours are within normal limits. Both lungs are clear. The visualized skeletal structures are unremarkable. IMPRESSION: No acute abnormality of the lungs in AP portable projection. Electronically Signed   By: Delanna Ahmadi M.D.   On: 01/08/2021 14:04   DG Swallowing Func-Speech Pathology  Result  Date: 01/09/2021 Table formatting from the original result was not included. Objective Swallowing Evaluation: Type of Study: MBS-Modified Barium Swallow Study  Patient Details Name: David [name] MRN: [id number] Date of Birth: [birth year] Today's Date: 01/09/2021 Time: SLP Start Time (ACUTE ONLY): 64 -SLP Stop Time (ACUTE ONLY): 1349 SLP Time Calculation (min) (ACUTE ONLY): 34 min Past Medical History: Past Medical History: Diagnosis Date  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[place])   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 ([place])    Pneumothorax   Left lung spontaneous pneumothorax at age 66 yr  Past Surgical History: Past Surgical History: Procedure Laterality Date  chest tubes Left  HPI: Hyden Soley is a 45 y.o. 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 6 weeks. 11/20/20 CT neck revealed asymmetric soft tissue thickening of the right oropharynx, extending into the right supraglottis and glottis, and enlarged right level 2 lymph node concerning for malignancy, C3-4, C4-5, and C5-6 degenration, after trauma 2016.  Pt has been seen by SLP as an OP for initiation of dysphagia treatment.  Per RN, oral cavity erythemic and pt c/o discomfort.  He will receive 35 fractions of radiation to his Larynx and bilateral neck along with chemotherapy once every 3 weeks (David [name]).   He started on 12/18/20 and will complete on 02/08/21.    Per SLP OP note, pt was impulsive with intake but tolerated po.  Subjective: pt awake in flouro chair  Recommendations for follow up therapy are one component of a multi-disciplinary discharge planning process, led by the attending physician.  Recommendations may be updated based on patient status, additional functional criteria and insurance authorization. Assessment / Plan / Recommendation Clinical Impressions 01/09/2021 Clinical Impression Patient presents with moderately severe pharyngeal dysphagia mostly c/b decreased muscular contraction, impaired laryngeal closure/tongue base retraction likely from edema/iatrogenic effects of XRT. 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  Aforementioned deficits allow secretion aspiration, retention across all consistencies and aspiration of liquids and secretions.  Pt did not aspirate solids  and retention was not severe.  To mitigate aspiration, recommend dys3/nectar diet - allowing tsps thin water after oral care, single Ice chips anytime.  Pt needs to double swallow with liquids - take SMALL boluses.  He both silently and audibly aspirates and thus following swallow precautions will be prudent for assurance of po tolerance.  Though pt states this is a new phenomenon, given his decreased awareness to overtly coughing with intake during clinical evaluation, suspect acute on chronic deficits from XRT.  Using teach back with video, educated pt to findings/recommendations. SLP Visit Diagnosis Dysphagia, oropharyngeal phase (R13.12) Attention and concentration deficit following -- Frontal lobe and executive function deficit following -- Impact on safety and function Moderate aspiration risk   Treatment Recommendations 01/09/2021 Treatment Recommendations Therapy as outlined in treatment plan below   Prognosis 01/09/2021 Prognosis for Safe Diet Advancement Good Barriers to Reach Goals Time post onset;Other (Comment);Behavior Barriers/Prognosis Comment -- Diet Recommendations 01/09/2021 SLP Diet Recommendations Dysphagia 3 (Mech soft) solids;Nectar thick liquid Liquid Administration via -- Medication Administration Whole meds with puree Compensations Slow rate;Small sips/bites Postural Changes Remain semi-upright after after feeds/meals (Comment);Seated upright at 90 degrees   Other Recommendations 01/09/2021 Recommended Consults -- Oral Care Recommendations Oral care QID Other Recommendations Have oral suction available Follow  Up Recommendations Outpatient SLP Assistance recommended at discharge Frequent or constant Supervision/Assistance Functional Status Assessment Patient has had a recent decline in their functional status and demonstrates the ability to make significant improvements in function in a reasonable and predictable amount of time. Frequency and Duration  01/09/2021 Speech Therapy Frequency  (ACUTE ONLY) min 2x/week Treatment Duration 2 weeks   Oral Phase 01/09/2021 Oral Phase WFL Oral - Pudding Teaspoon -- Oral - Pudding Cup -- Oral - Honey Teaspoon -- Oral - Honey Cup -- Oral - Nectar Teaspoon -- Oral - Nectar Cup -- Oral - Nectar Straw -- Oral - Thin Teaspoon -- Oral - Thin Cup -- Oral - Thin Straw -- Oral - Puree -- Oral - Mech Soft -- Oral - Regular -- Oral - Multi-Consistency -- Oral - Pill -- Oral Phase - Comment --  Pharyngeal Phase 01/09/2021 Pharyngeal Phase Impaired Pharyngeal- Pudding Teaspoon -- Pharyngeal -- Pharyngeal- Pudding Cup -- Pharyngeal -- Pharyngeal- Honey Teaspoon -- Pharyngeal -- Pharyngeal- Honey Cup -- Pharyngeal -- Pharyngeal- Nectar Teaspoon Reduced laryngeal elevation;Reduced airway/laryngeal closure;Reduced tongue base retraction;Reduced epiglottic inversion;Reduced pharyngeal peristalsis Pharyngeal Material enters airway, remains ABOVE vocal cords and not ejected out Pharyngeal- Nectar Cup Penetration/Aspiration during swallow;Reduced laryngeal elevation;Reduced airway/laryngeal closure;Reduced tongue base retraction;Significant aspiration (Amount);Penetration/Apiration after swallow;Reduced pharyngeal peristalsis;Reduced epiglottic inversion Pharyngeal Material enters airway, passes BELOW cords without attempt by patient to eject out (silent aspiration) Pharyngeal- Nectar Straw -- Pharyngeal -- Pharyngeal- Thin Teaspoon Pharyngeal residue - valleculae;Reduced airway/laryngeal closure;Reduced laryngeal elevation;Reduced anterior laryngeal mobility;Penetration/Aspiration during swallow;Reduced epiglottic inversion;Reduced pharyngeal peristalsis;Reduced tongue base retraction Pharyngeal Material enters airway, passes BELOW cords and not ejected out despite cough attempt by patient Pharyngeal- Thin Cup Reduced laryngeal elevation;Reduced airway/laryngeal closure;Reduced tongue base retraction;Pharyngeal residue - valleculae;Pharyngeal residue - pyriform Pharyngeal Material  enters airway, passes BELOW cords without attempt by patient to eject out (silent aspiration) Pharyngeal- Thin Straw -- Pharyngeal -- Pharyngeal- Puree Pharyngeal residue - valleculae;Reduced tongue base retraction Pharyngeal Material does not enter airway Pharyngeal- Mechanical Soft Pharyngeal residue - valleculae;Reduced laryngeal elevation;Reduced epiglottic inversion;Reduced pharyngeal peristalsis;Reduced tongue base retraction Pharyngeal Material does not enter airway Pharyngeal- Regular -- Pharyngeal -- Pharyngeal- Multi-consistency -- Pharyngeal -- Pharyngeal- Pill -- Pharyngeal -- Pharyngeal Comment chin tuck worsened airway protection - as it allowed spillage of barium into open larynx; cued cough propelled barium from proximal esophagus into pharynx/trachea post-swallow- gross amount of aspiration; multiple dry swallows assisted to decrease retention; HOB posterior to less than 45* worsened airway protection as it allowed aspiration into posterior trachea; pt will propel liquid into oral cavity and re-swallow as times without awareness; pt is grossly aspirating secretions and this mixes with barium with subsequent aspiration  Cervical Esophageal Phase  01/09/2021 Cervical Esophageal Phase Impaired Pudding Teaspoon -- Pudding Cup -- Honey Teaspoon -- Honey Cup -- Nectar Teaspoon -- Nectar Cup -- Nectar Straw -- Thin Teaspoon -- Thin Cup -- Thin Straw -- Puree -- Mechanical Soft -- Regular -- Multi-consistency -- Pill -- Cervical Esophageal Comment Upon esophageal sweep, pt appeared with residual throughout - suspect esophageal dysphagia component Kathleen Lime, MS [facility] SLP Acute Rehab Services Office [phone] Pager [phone] Macario Golds 01/09/2021, 3:10 PM                      Subjective: Eager to go home  Discharge Exam: Vitals:   01/11/21 0837 01/11/21 1331  BP:  121/86  Pulse:  71  Resp:  18  Temp:  98.4 F (36.9 C)  SpO2: 97% 99%  Vitals:   01/10/21 2111 01/11/21 0400 01/11/21  0837 01/11/21 1331  BP:  121/86  121/86  Pulse:  80  71  Resp:  17  18  Temp:  98 F (36.7 C)  98.4 F (36.9 C)  TempSrc:  Oral  Oral  SpO2: 98% 99% 97% 99%  Weight:      Height:        General: Pt is alert, awake, not in acute distress Cardiovascular: RRR, S1/S2 + Respiratory: CTA bilaterally, no wheezing, no rhonchi Abdominal: Soft, NT, ND, bowel sounds + Extremities: no edema, no cyanosis   The results of significant diagnostics from this hospitalization (including imaging, microbiology, ancillary and laboratory) are listed below for reference.     Microbiology: Recent Results (from the past 240 hour(s))  Blood culture (routine x 2)     Status: Abnormal   Collection Time: 01/08/21  1:30 PM   Specimen: BLOOD RIGHT FOREARM  Result Value Ref Range Status   Specimen Description   Final    BLOOD RIGHT FOREARM Performed at [facility] [address], [place], [place] [address]    Special Requests   Final    BOTTLES DRAWN AEROBIC AND ANAEROBIC Blood Culture results may not be optimal due to an inadequate volume of blood received in culture bottles Performed at [facility] [address] [place], [place] [address]    Culture  Setup Time   Final    GRAM POSITIVE COCCI IN CHAINS ANAEROBIC BOTTLE ONLY CRITICAL RESULT CALLED TO, READ BACK BY AND VERIFIED WITH: PHARMD T GREENE [id number] AT 6 BY CM    Culture (A)  Final    STREPTOCOCCUS PYOGENES HEALTH DEPARTMENT NOTIFIED Performed at [facility], [place] [address] [place], [place] [address]    Report Status 01/11/2021 FINAL  Final   Organism ID, Bacteria STREPTOCOCCUS PYOGENES  Final      Susceptibility   Streptococcus pyogenes - MIC*    PENICILLIN <=0.06 SENSITIVE Sensitive     CEFTRIAXONE <=0.12 SENSITIVE Sensitive     ERYTHROMYCIN >=8 RESISTANT Resistant     LEVOFLOXACIN <=0.25 SENSITIVE Sensitive     VANCOMYCIN 0.5 SENSITIVE Sensitive     * STREPTOCOCCUS PYOGENES   Blood Culture ID Panel (Reflexed)     Status: Abnormal   Collection Time: 01/08/21  1:30 PM  Result Value Ref Range Status   Enterococcus faecalis NOT DETECTED NOT DETECTED Final   Enterococcus Faecium NOT DETECTED NOT DETECTED Final   Listeria monocytogenes NOT DETECTED NOT DETECTED Final   Staphylococcus species NOT DETECTED NOT DETECTED Final   Staphylococcus aureus (BCID) NOT DETECTED NOT DETECTED Final   Staphylococcus epidermidis NOT DETECTED NOT DETECTED Final   Staphylococcus lugdunensis NOT DETECTED NOT DETECTED Final   Streptococcus species DETECTED (A) NOT DETECTED Final    Comment: CRITICAL RESULT CALLED TO, READ BACK BY AND VERIFIED WITH: PHARMD T GREENE [id number] AT 1043 BY CM    Streptococcus agalactiae NOT DETECTED NOT DETECTED Final   Streptococcus pneumoniae NOT DETECTED NOT DETECTED Final   Streptococcus pyogenes DETECTED (A) NOT DETECTED Final    Comment: CRITICAL RESULT CALLED TO, READ BACK BY AND VERIFIED WITH: PHARMD T GREENE [id number] AT 1043 BY CM    A.calcoaceticus-baumannii NOT DETECTED NOT DETECTED Final   Bacteroides fragilis NOT DETECTED NOT DETECTED Final   Enterobacterales NOT DETECTED NOT DETECTED Final   Enterobacter cloacae complex NOT DETECTED NOT DETECTED Final   Escherichia coli NOT DETECTED NOT DETECTED Final  Klebsiella aerogenes NOT DETECTED NOT DETECTED Final   Klebsiella oxytoca NOT DETECTED NOT DETECTED Final   Klebsiella pneumoniae NOT DETECTED NOT DETECTED Final   Proteus species NOT DETECTED NOT DETECTED Final   Salmonella species NOT DETECTED NOT DETECTED Final   Serratia marcescens NOT DETECTED NOT DETECTED Final   Haemophilus influenzae NOT DETECTED NOT DETECTED Final   Neisseria meningitidis NOT DETECTED NOT DETECTED Final   Pseudomonas aeruginosa NOT DETECTED NOT DETECTED Final   Stenotrophomonas maltophilia NOT DETECTED NOT DETECTED Final   Candida albicans NOT DETECTED NOT DETECTED Final   Candida auris NOT DETECTED NOT DETECTED  Final   Candida glabrata NOT DETECTED NOT DETECTED Final   Candida krusei NOT DETECTED NOT DETECTED Final   Candida parapsilosis NOT DETECTED NOT DETECTED Final   Candida tropicalis NOT DETECTED NOT DETECTED Final   Cryptococcus neoformans/gattii NOT DETECTED NOT DETECTED Final    Comment: Performed at [facility], [place] [address], [place], [place] [address]  Blood culture (routine x 2)     Status: None (Preliminary result)   Collection Time: 01/08/21  1:35 PM   Specimen: BLOOD  Result Value Ref Range Status   Specimen Description   Final    BLOOD RIGHT ANTECUBITAL Performed at [facility] [address] [place], [place] [address]    Special Requests   Final    BOTTLES DRAWN AEROBIC ONLY Blood Culture results may not be optimal due to an inadequate volume of blood received in culture bottles Performed at [facility] [address], [place], [place] [address]    Culture   Final    NO GROWTH 3 DAYS Performed at [facility], [place] [address], [place], [place] [address]    Report Status PENDING  Incomplete  Resp Panel by RT-PCR (Flu A&B, Covid) Nasopharyngeal Swab     Status: None   Collection Time: 01/08/21  1:49 PM   Specimen: Nasopharyngeal Swab; Nasopharyngeal(NP) swabs in vial transport medium  Result Value Ref Range Status   SARS Coronavirus 2 by RT PCR NEGATIVE NEGATIVE Final    Comment: (NOTE) SARS-CoV-2 target nucleic acids are NOT DETECTED.  The SARS-CoV-2 RNA is generally detectable in upper respiratory specimens during the acute phase of infection. The lowest concentration of SARS-CoV-2 viral copies this assay can detect is 138 copies/mL. A negative result does not preclude SARS-Cov-2 infection and should not be used as the sole basis for treatment or other patient management decisions. A negative result may occur with  improper specimen collection/handling, submission of specimen other than nasopharyngeal swab,  presence of viral mutation(s) within the areas targeted by this assay, and inadequate number of viral copies(<138 copies/mL). A negative result must be combined with clinical observations, patient history, and epidemiological information. The expected result is Negative.  Fact Sheet for Patients:  EntrepreneurPulse.com.au  Fact Sheet for Healthcare Providers:  IncredibleEmployment.be  This test is no t yet approved or cleared by the [place] FDA and  has been authorized for detection and/or diagnosis of SARS-CoV-2 by FDA under an Emergency Use Authorization (EUA). This EUA will remain  in effect (meaning this test can be used) for the duration of the COVID-19 declaration under Section 564(b)(1) of the Act, 21 U.S.C.section 360bbb-3(b)(1), unless the authorization is terminated  or revoked sooner.       Influenza A by PCR NEGATIVE NEGATIVE Final   Influenza B by PCR NEGATIVE NEGATIVE Final    Comment: (NOTE) The Xpert Xpress SARS-CoV-2/FLU/RSV  plus assay is intended as an aid in the diagnosis of influenza from Nasopharyngeal swab specimens and should not be used as a sole basis for treatment. Nasal washings and aspirates are unacceptable for Xpert Xpress SARS-CoV-2/FLU/RSV testing.  Fact Sheet for Patients: EntrepreneurPulse.com.au  Fact Sheet for Healthcare Providers: IncredibleEmployment.be  This test is not yet approved or cleared by the [place] FDA and has been authorized for detection and/or diagnosis of SARS-CoV-2 by FDA under an Emergency Use Authorization (EUA). This EUA will remain in effect (meaning this test can be used) for the duration of the COVID-19 declaration under Section 564(b)(1) of the Act, 21 U.S.C. section 360bbb-3(b)(1), unless the authorization is terminated or revoked.  Performed at [facility], [place] [address], [place], [place] [address]       Labs: BNP (last 3 results) No results for input(s): BNP in the last 8760 hours. Basic Metabolic Panel: Recent Labs  Lab 01/08/21 1329 01/09/21 0019 01/10/21 0356 01/11/21 0350  NA 128* 133* 137 136  K 3.5 3.3* 4.0 4.1  CL 91* 99 101 102  CO2 26 28 27 27   GLUCOSE 91 122* 111* 172*  BUN 21* 12 13 17   CREATININE 1.32* 0.79 0.55* 0.62  CALCIUM 8.4* 7.3* 8.1* 8.2*  MG  --  1.4* 2.1  --   PHOS  --   --  3.2  --    Liver Function Tests: Recent Labs  Lab 01/08/21 1329 01/10/21 0356 01/11/21 0350  AST 36 21 14*  ALT 42 32 23  ALKPHOS 80 55 56  BILITOT 1.1 0.5 0.4  PROT 6.1* 5.8* 5.5*  ALBUMIN 2.8* 2.3* 2.3*   No results for input(s): LIPASE, AMYLASE in the last 168 hours. No results for input(s): AMMONIA in the last 168 hours. CBC: Recent Labs  Lab 01/08/21 1329 01/09/21 0019 01/10/21 0356 01/11/21 0350  WBC 1.8* 1.0* 4.4 3.9*  NEUTROABS 1.0* 0.6* 3.8  --   HGB 10.7* 9.9* 9.9* 9.5*  HCT 31.8* 28.5* 30.3* 28.8*  MCV 86.6 85.1 87.8 89.2  PLT 124* 78* 104* 103*   Cardiac Enzymes: No results for input(s): CKTOTAL, CKMB, CKMBINDEX, TROPONINI in the last 168 hours. BNP: Invalid input(s): POCBNP CBG: Recent Labs  Lab 01/08/21 1321  GLUCAP 98   D-Dimer No results for input(s): DDIMER in the last 72 hours. Hgb A1c No results for input(s): HGBA1C in the last 72 hours. Lipid Profile No results for input(s): CHOL, HDL, LDLCALC, TRIG, CHOLHDL, LDLDIRECT in the last 72 hours. Thyroid function studies No results for input(s): TSH, T4TOTAL, T3FREE, THYROIDAB in the last 72 hours.  Invalid input(s): FREET3 Anemia work up No results for input(s): VITAMINB12, FOLATE, FERRITIN, TIBC, IRON, RETICCTPCT in the last 72 hours. Urinalysis    Component Value Date/Time   COLORURINE YELLOW 01/08/2021 2105   APPEARANCEUR CLEAR 01/08/2021 2105   LABSPEC 1.010 01/08/2021 2105   PHURINE 7.0 01/08/2021 2105   GLUCOSEU NEGATIVE 01/08/2021 2105   HGBUR NEGATIVE 01/08/2021 2105    BILIRUBINUR NEGATIVE 01/08/2021 2105   KETONESUR NEGATIVE 01/08/2021 2105   PROTEINUR NEGATIVE 01/08/2021 2105   NITRITE NEGATIVE 01/08/2021 2105   LEUKOCYTESUR NEGATIVE 01/08/2021 2105   Sepsis Labs Invalid input(s): PROCALCITONIN,  WBC,  LACTICIDVEN Microbiology Recent Results (from the past 240 hour(s))  Blood culture (routine x 2)     Status: Abnormal   Collection Time: 01/08/21  1:30 PM   Specimen: BLOOD RIGHT FOREARM  Result Value Ref Range Status   Specimen Description  Final    BLOOD RIGHT FOREARM Performed at [facility] [address] [place], [place] [address]    Special Requests   Final    BOTTLES DRAWN AEROBIC AND ANAEROBIC Blood Culture results may not be optimal due to an inadequate volume of blood received in culture bottles Performed at [facility] [address] [place], [place] [address]    Culture  Setup Time   Final    GRAM POSITIVE COCCI IN CHAINS ANAEROBIC BOTTLE ONLY CRITICAL RESULT CALLED TO, READ BACK BY AND VERIFIED WITH: PHARMD T GREENE [id number] AT 42 BY CM    Culture (A)  Final    STREPTOCOCCUS PYOGENES HEALTH DEPARTMENT NOTIFIED Performed at [facility], [place] [address] [place], [place] [address]    Report Status 01/11/2021 FINAL  Final   Organism ID, Bacteria STREPTOCOCCUS PYOGENES  Final      Susceptibility   Streptococcus pyogenes - MIC*    PENICILLIN <=0.06 SENSITIVE Sensitive     CEFTRIAXONE <=0.12 SENSITIVE Sensitive     ERYTHROMYCIN >=8 RESISTANT Resistant     LEVOFLOXACIN <=0.25 SENSITIVE Sensitive     VANCOMYCIN 0.5 SENSITIVE Sensitive     * STREPTOCOCCUS PYOGENES  Blood Culture ID Panel (Reflexed)     Status: Abnormal   Collection Time: 01/08/21  1:30 PM  Result Value Ref Range Status   Enterococcus faecalis NOT DETECTED NOT DETECTED Final   Enterococcus Faecium NOT DETECTED NOT DETECTED Final   Listeria monocytogenes NOT DETECTED NOT DETECTED Final   Staphylococcus species  NOT DETECTED NOT DETECTED Final   Staphylococcus aureus (BCID) NOT DETECTED NOT DETECTED Final   Staphylococcus epidermidis NOT DETECTED NOT DETECTED Final   Staphylococcus lugdunensis NOT DETECTED NOT DETECTED Final   Streptococcus species DETECTED (A) NOT DETECTED Final    Comment: CRITICAL RESULT CALLED TO, READ BACK BY AND VERIFIED WITH: PHARMD T GREENE [id number] AT 1043 BY CM    Streptococcus agalactiae NOT DETECTED NOT DETECTED Final   Streptococcus pneumoniae NOT DETECTED NOT DETECTED Final   Streptococcus pyogenes DETECTED (A) NOT DETECTED Final    Comment: CRITICAL RESULT CALLED TO, READ BACK BY AND VERIFIED WITH: PHARMD T GREENE [id number] AT 1043 BY CM    A.calcoaceticus-baumannii NOT DETECTED NOT DETECTED Final   Bacteroides fragilis NOT DETECTED NOT DETECTED Final   Enterobacterales NOT DETECTED NOT DETECTED Final   Enterobacter cloacae complex NOT DETECTED NOT DETECTED Final   Escherichia coli NOT DETECTED NOT DETECTED Final   Klebsiella aerogenes NOT DETECTED NOT DETECTED Final   Klebsiella oxytoca NOT DETECTED NOT DETECTED Final   Klebsiella pneumoniae NOT DETECTED NOT DETECTED Final   Proteus species NOT DETECTED NOT DETECTED Final   Salmonella species NOT DETECTED NOT DETECTED Final   Serratia marcescens NOT DETECTED NOT DETECTED Final   Haemophilus influenzae NOT DETECTED NOT DETECTED Final   Neisseria meningitidis NOT DETECTED NOT DETECTED Final   Pseudomonas aeruginosa NOT DETECTED NOT DETECTED Final   Stenotrophomonas maltophilia NOT DETECTED NOT DETECTED Final   Candida albicans NOT DETECTED NOT DETECTED Final   Candida auris NOT DETECTED NOT DETECTED Final   Candida glabrata NOT DETECTED NOT DETECTED Final   Candida krusei NOT DETECTED NOT DETECTED Final   Candida parapsilosis NOT DETECTED NOT DETECTED Final   Candida tropicalis NOT DETECTED NOT DETECTED Final   Cryptococcus neoformans/gattii NOT DETECTED NOT DETECTED Final    Comment: Performed at [facility], [address] [place], [place] [address]  Blood  culture (routine x 2)     Status: None (Preliminary result)   Collection Time: 01/08/21  1:35 PM   Specimen: BLOOD  Result Value Ref Range Status   Specimen Description   Final    BLOOD RIGHT ANTECUBITAL Performed at [facility] [address] [place], [place] [address]    Special Requests   Final    BOTTLES DRAWN AEROBIC ONLY Blood Culture results may not be optimal due to an inadequate volume of blood received in culture bottles Performed at [facility] [address] [place], [place] [address]    Culture   Final    NO GROWTH 3 DAYS Performed at [facility], [place] [address] [place], [place] [address]    Report Status PENDING  Incomplete  Resp Panel by RT-PCR (Flu A&B, Covid) Nasopharyngeal Swab     Status: None   Collection Time: 01/08/21  1:49 PM   Specimen: Nasopharyngeal Swab; Nasopharyngeal(NP) swabs in vial transport medium  Result Value Ref Range Status   SARS Coronavirus 2 by RT PCR NEGATIVE NEGATIVE Final    Comment: (NOTE) SARS-CoV-2 target nucleic acids are NOT DETECTED.  The SARS-CoV-2 RNA is generally detectable in upper respiratory specimens during the acute phase of infection. The lowest concentration of SARS-CoV-2 viral copies this assay can detect is 138 copies/mL. A negative result does not preclude SARS-Cov-2 infection and should not be used as the sole basis for treatment or other patient management decisions. A negative result may occur with  improper specimen collection/handling, submission of specimen other than nasopharyngeal swab, presence of viral mutation(s) within the areas targeted by this assay, and inadequate number of viral copies(<138 copies/mL). A negative result must be combined with clinical observations, patient history, and epidemiological information. The expected result is Negative.  Fact Sheet for Patients:   EntrepreneurPulse.com.au  Fact Sheet for Healthcare Providers:  IncredibleEmployment.be  This test is no t yet approved or cleared by the [place] FDA and  has been authorized for detection and/or diagnosis of SARS-CoV-2 by FDA under an Emergency Use Authorization (EUA). This EUA will remain  in effect (meaning this test can be used) for the duration of the COVID-19 declaration under Section 564(b)(1) of the Act, 21 U.S.C.section 360bbb-3(b)(1), unless the authorization is terminated  or revoked sooner.       Influenza A by PCR NEGATIVE NEGATIVE Final   Influenza B by PCR NEGATIVE NEGATIVE Final    Comment: (NOTE) The Xpert Xpress SARS-CoV-2/FLU/RSV plus assay is intended as an aid in the diagnosis of influenza from Nasopharyngeal swab specimens and should not be used as a sole basis for treatment. Nasal washings and aspirates are unacceptable for Xpert Xpress SARS-CoV-2/FLU/RSV testing.  Fact Sheet for Patients: EntrepreneurPulse.com.au  Fact Sheet for Healthcare Providers: IncredibleEmployment.be  This test is not yet approved or cleared by the [place] FDA and has been authorized for detection and/or diagnosis of SARS-CoV-2 by FDA under an Emergency Use Authorization (EUA). This EUA will remain in effect (meaning this test can be used) for the duration of the COVID-19 declaration under Section 564(b)(1) of the Act, 21 U.S.C. section 360bbb-3(b)(1), unless the authorization is terminated or revoked.  Performed at [facility], [place] [address] [place], [place] [address]    Time spent: 30 min  SIGNED:   Marylu Lund, MD  [facility] 01/11/2021, 4:06 PM  If 7PM-7AM, please contact night-coverage

## 2021-01-12 ENCOUNTER — Ambulatory Visit
Admission: RE | Admit: 2021-01-12 | Discharge: 2021-01-12 | Disposition: A | Discharge status: Home / Self Care | Payer: Medicaid Other | Source: Ambulatory Visit | Attending: Radiation Oncology | Admitting: Radiation Oncology

## 2021-01-12 ENCOUNTER — Other Ambulatory Visit: Payer: Self-pay

## 2021-01-12 ENCOUNTER — Ambulatory Visit: Payer: Medicaid Other

## 2021-01-12 ENCOUNTER — Telehealth (HOSPITAL_COMMUNITY): Payer: Medicaid Other | Admitting: Nurse Practitioner

## 2021-01-12 DIAGNOSIS — T451X5A Adverse effect of antineoplastic and immunosuppressive drugs, initial encounter: Secondary | ICD-10-CM | POA: Diagnosis present

## 2021-01-12 DIAGNOSIS — R634 Abnormal weight loss: Secondary | ICD-10-CM | POA: Diagnosis present

## 2021-01-12 DIAGNOSIS — G893 Neoplasm related pain (acute) (chronic): Secondary | ICD-10-CM

## 2021-01-12 DIAGNOSIS — Z515 Encounter for palliative care: Secondary | ICD-10-CM

## 2021-01-12 DIAGNOSIS — R112 Nausea with vomiting, unspecified: Secondary | ICD-10-CM | POA: Diagnosis present

## 2021-01-12 DIAGNOSIS — C328 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: Secondary | ICD-10-CM | POA: Diagnosis not present

## 2021-01-12 DIAGNOSIS — F1920 Other psychoactive substance dependence, uncomplicated: Secondary | ICD-10-CM | POA: Diagnosis present

## 2021-01-12 MED FILL — Dexamethasone Sodium Phosphate Inj 100 MG/10ML: INTRAMUSCULAR | Qty: 1 | Status: AC

## 2021-01-12 MED FILL — Fosaprepitant Dimeglumine For IV Infusion 150 MG (Base Eq): INTRAVENOUS | Qty: 5 | Status: AC

## 2021-01-12 NOTE — Telephone Encounter (Signed)
Received a call from patient's mother concerned about patient receiving his methadone and marinol.   Ms. David [name] is emotional expressing she has been appreciative of our involvement with David [name] as he has been doing better and the decrease level of stress having to go to [employer] everyday at [place]. She also states this was costing her $435/month where it only cost her $14 to receive the medication at his pharmacy and to follow-up with our team.   I explained to patient's mother that unfortunately due to the breach in contract I would no longer be able to prescribe for [place]. Emphasized the concerns for safety not only for him but for myself as a provider and ethically. She verbalized understanding.   Ms. David [name] shares she and patient has been arguing about this since he was discharged. States patient is "acting" like he doesn't know anything about any use of illicit drugs and that he did not breach the contract. Mother is asking me what exactly took place in the ER and what did patient state he took because he would not have had money to buy anything. I advised [name] to have a discussion with David [name] as I am not at liberty to disclose this information. David [name] did provided verbal permission during previous visits to have discussions with his mother however given their psychosocial needs and stressors felt it was best for them to have said conversations.   She is emotional stating he goes out of the house and panhandles with a sign on or near [employer]. She states this is where he is now and she can't keep an eye on him when he does this but he promised to her he was not doing anything illegal. I offered listening support. Mother is asking if we could re-consider prescribing and following patient if he agreed this would not happen again. I explained unfortunately I would not be comfortable at this point and given pattern of behaviors there are concerns that this could possibly happen again. Although I know patient has  legitimate pain again myself or the palliative team will not be able to prescribe any form of controlled substance for [name].   Mother is aware I will be happy to remain involved in his care for any goals of care needs or concerns but solely with this focus. She knows they may reach out if this becomes a need or interest.   She verbalizes understanding expressing she would like to get patient in an inpatient rehab to detox for good but unsure he would agree. I again advised to have a conversation with patient and also with his [facility] team for further advisement.   Ms. David [name] verbalized her understanding and appreciation.

## 2021-01-12 NOTE — Progress Notes (Signed)
° ° ° °  Chart Reviewed. Patient examined at the bedside.   David [name] recently returned from radiation. He is sitting up in bed. Awake and alert. States he is tired and ready to go home. No acute distress noted. He has Doritos, Cheetos, and a hershey bar on his bedside table.   We discussed his decreased appetite in addition to his pain. He is able to eat certain foods but complains that they no longer taste the way that they should. He has an appetite for these items and still eats them for his comfort in knowing what they used to taste like and his favorite snacks.   He complains of throat pain. He has chloraseptic at the bedside which he used and states it provides temporary relief.   I discussed with him regarding his pain management. He initially closes eyes and states he is tired and needs to rest. However I explained we needed to discuss his heroin use and what things will look like in the future. He acknowledges signing our opioid contract with strict parameters of no illicit drug use and knows he receives weekly urine drug testing. He becomes upset and expresses he doesn't like to be judged. 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 He is asking for another chance. Unfortunately this is not something we can do and I again explained the purpose of the contract.   He is established and has been closely followed by [facility]. Advised to reach out to them to continue with their support. He verbalized understanding and expressed "now I have to fuss with my mother about this shit!"   Time Total: 40 min.   Visit consisted of counseling and education dealing with the complex and emotionally intense issues of symptom management and palliative care in the setting of serious and  potentially life-threatening illness.Greater than 50%  of this time was spent counseling and coordinating care related to the above assessment and plan.  Alda Lea, AGPCNP-BC  Palliative Medicine Team [phone]

## 2021-01-13 LAB — CULTURE, BLOOD (ROUTINE X 2): Culture: NO GROWTH

## 2021-01-14 ENCOUNTER — Ambulatory Visit: Payer: Medicaid Other

## 2021-01-15 ENCOUNTER — Inpatient Hospital Stay (HOSPITAL_BASED_OUTPATIENT_CLINIC_OR_DEPARTMENT_OTHER): Payer: Medicaid Other | Admitting: Hematology and Oncology

## 2021-01-15 ENCOUNTER — Inpatient Hospital Stay: Payer: Medicaid Other

## 2021-01-15 ENCOUNTER — Encounter: Payer: Medicaid Other | Admitting: Nurse Practitioner

## 2021-01-15 ENCOUNTER — Ambulatory Visit: Payer: Medicaid Other

## 2021-01-15 ENCOUNTER — Inpatient Hospital Stay: Payer: Medicaid Other | Admitting: Nutrition

## 2021-01-15 ENCOUNTER — Other Ambulatory Visit: Payer: Self-pay | Admitting: Radiation Oncology

## 2021-01-15 ENCOUNTER — Inpatient Hospital Stay: Payer: Medicaid Other | Admitting: Internal Medicine

## 2021-01-15 ENCOUNTER — Encounter: Payer: Self-pay | Admitting: Hematology and Oncology

## 2021-01-15 ENCOUNTER — Other Ambulatory Visit: Payer: Self-pay

## 2021-01-15 ENCOUNTER — Ambulatory Visit
Admission: RE | Admit: 2021-01-15 | Discharge: 2021-01-15 | Disposition: A | Discharge status: Home / Self Care | Payer: Medicaid Other | Source: Ambulatory Visit | Attending: Radiation Oncology | Admitting: Radiation Oncology

## 2021-01-15 DIAGNOSIS — C328 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: Secondary | ICD-10-CM

## 2021-01-15 DIAGNOSIS — R112 Nausea with vomiting, unspecified: Secondary | ICD-10-CM | POA: Diagnosis not present

## 2021-01-15 DIAGNOSIS — F1920 Other psychoactive substance dependence, uncomplicated: Secondary | ICD-10-CM | POA: Insufficient documentation

## 2021-01-15 DIAGNOSIS — R634 Abnormal weight loss: Secondary | ICD-10-CM | POA: Insufficient documentation

## 2021-01-15 DIAGNOSIS — R5383 Other fatigue: Secondary | ICD-10-CM

## 2021-01-15 DIAGNOSIS — D701 Agranulocytosis secondary to cancer chemotherapy: Secondary | ICD-10-CM

## 2021-01-15 DIAGNOSIS — T451X5A Adverse effect of antineoplastic and immunosuppressive drugs, initial encounter: Secondary | ICD-10-CM

## 2021-01-15 LAB — URINALYSIS, COMPLETE (UACMP) WITH MICROSCOPIC
Bilirubin Urine: NEGATIVE
Glucose, UA: NEGATIVE mg/dL
Hgb urine dipstick: NEGATIVE
Ketones, ur: 5 mg/dL — AB
Leukocytes,Ua: NEGATIVE
Nitrite: NEGATIVE
Protein, ur: NEGATIVE mg/dL
Specific Gravity, Urine: 1.023 (ref 1.005–1.030)
pH: 6 (ref 5.0–8.0)

## 2021-01-15 LAB — CBC WITH DIFFERENTIAL (CANCER CENTER ONLY)
Abs Immature Granulocytes: 0.36 10*3/uL — ABNORMAL HIGH (ref 0.00–0.07)
Basophils Absolute: 0.1 10*3/uL (ref 0.0–0.1)
Basophils Relative: 1 %
Eosinophils Absolute: 0 10*3/uL (ref 0.0–0.5)
Eosinophils Relative: 0 %
HCT: 33.7 % — ABNORMAL LOW (ref 39.0–52.0)
Hemoglobin: 11.3 g/dL — ABNORMAL LOW (ref 13.0–17.0)
Immature Granulocytes: 7 %
Lymphocytes Relative: 9 %
Lymphs Abs: 0.5 10*3/uL — ABNORMAL LOW (ref 0.7–4.0)
MCH: 29 pg (ref 26.0–34.0)
MCHC: 33.5 g/dL (ref 30.0–36.0)
MCV: 86.6 fL (ref 80.0–100.0)
Monocytes Absolute: 0.6 10*3/uL (ref 0.1–1.0)
Monocytes Relative: 10 %
Neutro Abs: 4 10*3/uL (ref 1.7–7.7)
Neutrophils Relative %: 73 %
Platelet Count: 339 10*3/uL (ref 150–400)
RBC: 3.89 MIL/uL — ABNORMAL LOW (ref 4.22–5.81)
RDW: 17.1 % — ABNORMAL HIGH (ref 11.5–15.5)
WBC Count: 5.4 10*3/uL (ref 4.0–10.5)
nRBC: 0 % (ref 0.0–0.2)

## 2021-01-15 LAB — MAGNESIUM: Magnesium: 2 mg/dL (ref 1.7–2.4)

## 2021-01-15 LAB — CMP (CANCER CENTER ONLY)
ALT: 40 U/L (ref 0–44)
AST: 37 U/L (ref 15–41)
Albumin: 2.9 g/dL — ABNORMAL LOW (ref 3.5–5.0)
Alkaline Phosphatase: 79 U/L (ref 38–126)
Anion gap: 9 (ref 5–15)
BUN: 28 mg/dL — ABNORMAL HIGH (ref 6–20)
CO2: 26 mmol/L (ref 22–32)
Calcium: 8.6 mg/dL — ABNORMAL LOW (ref 8.9–10.3)
Chloride: 103 mmol/L (ref 98–111)
Creatinine: 0.82 mg/dL (ref 0.61–1.24)
GFR, Estimated: >60 mL/min (ref >60)
Glucose, Bld: 121 mg/dL — ABNORMAL HIGH (ref 70–99)
Potassium: 4.1 mmol/L (ref 3.5–5.1)
Sodium: 138 mmol/L (ref 135–145)
Total Bilirubin: 0.4 mg/dL (ref 0.3–1.2)
Total Protein: 7.2 g/dL (ref 6.5–8.1)

## 2021-01-15 MED ORDER — LIDOCAINE VISCOUS HCL 2 % MT SOLN: OROMUCOSAL | 2 refills | Status: DC

## 2021-01-15 NOTE — Assessment & Plan Note (Signed)
He has lost about 10 pounds since his last visit.  I have clearly recommended to proceed with G-tube but he is very reluctant.  I have tried to explain to him that if he continues to lose weight, he will have a hard time tolerating treatment.  He again refuses it at this time.  He understands the importance of maintaining weight.

## 2021-01-15 NOTE — Progress Notes (Signed)
Per Dr. Chryl Heck - chemotherapy will be held for one week d/t pt undergoing course of abx.  Charge and infusion nurse aware that chemotherapy will be held today.  Scheduling message sent to reschedule patient's appointments for next week.

## 2021-01-15 NOTE — Progress Notes (Signed)
Oncology Nurse Navigator Documentation   Dory Peru RD notified me that Mr. Kauk agreed to a PEG when he saw her today. Dr. Chryl Heck has been notified and order placed. I've gotten him scheduled for 12/23 arrival time 7 am. I called and spoke to him and his mom and they are agreeable to the appointment. He is aware that he needs to be NPO after midnight on 12/22 and needs a driver with 24 hour supervision at home afterwards. I plan to provide PEG education on 12/21 after his radiation treatment and [name] is aware of this as well.   Harlow Asa RN, BSN, OCN Head & Neck Oncology Nurse [place] at [facility] Phone # [phone]  Fax # [phone]

## 2021-01-15 NOTE — Addendum Note (Signed)
Addended by: Adaline Sill on: 01/15/2021 01:27 PM   Modules accepted: Orders

## 2021-01-15 NOTE — Assessment & Plan Note (Signed)
Chemotherapy-induced nausea and vomiting.  Use as needed antinausea medications as prescribed.

## 2021-01-15 NOTE — Assessment & Plan Note (Addendum)
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 Julien Nordmann and Dr. Isidore Moos transferring care.  He received his first cycle of chemotherapy on 12/25/2020 and is here for pretoxicity check before cycle 2 of planned chemotherapy.  He appears to have multiple psychosocial issues, is on chronic pain management with the methadone clinic, follows up with palliative care and also has some social issues, homeless currently living with his mom.   He was recently discharged from the hospital after being treated for bacteremia and currently continues on high-dose amoxicillin.  He will complete his prescription on 01/21/2021.  At this time I recommend deferring chemotherapy until he is done with his antibiotics, he will return to clinic on 1226 with labs and MD visit before his planned second cycle of weekly cisplatin.  At this time given holiday schedule, we will having issues rescheduling the chemotherapy however request has been sent to infusion to reschedule.

## 2021-01-15 NOTE — Progress Notes (Signed)
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  Patient did not receive chemotherapy today.  He is being followed by Dr. Chryl Heck and Dr. Isidore Moos.  Weight decreased to 113.4 pounds December 19 down from 118 pounds December 5; Also decreased from 123 pounds November 22.  Labs include glucose 121, BUN 28, and albumin 2.9.  Estimated nutrition needs: 2265-2500 cal, 113-128 g protein, 2.3 L fluid.  5 cartons [employer] [place] 1.4 provides 2275 cal, 100 g protein, 1170 mL free water.  Nutrition diagnosis:  Inadequate oral intake continues. 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  Intervention: Stressed importance of increasing calories and protein.  Again provided education on the importance of adequate nutrition and recommended feeding tube placement.  Patient has agreed to this today. Will provide tube feeding education. Educated to continue baking soda and salt water rinses.  Sent request to MD for [place]auer suction per patient request. I reviewed high-calorie soft foods. Patient at high risk for refeeding syndrome.  Recommend drawing CMP, phosphorus, and magnesium 2 times a week.  Recommend 100 mg thiamine daily. Slow tube feeding advancement.  Monitoring, evaluation, goals: Patient will increase calories and protein to minimize weight loss.  Next visit: Thursday, December 22 for feeding tube education.  **Disclaimer: This note was dictated with voice recognition software. Similar sounding words can inadvertently be transcribed and this note may contain transcription errors which may not have been corrected upon publication of note.**

## 2021-01-15 NOTE — Progress Notes (Addendum)
[name] CONSULT NOTE  Patient Care Team: Default, Provider, MD as PCP - General  CHIEF COMPLAINTS/PURPOSE OF CONSULTATION:  SCC of overlapping sites of larynx, here for pre chemo visit.  ASSESSMENT & PLAN:   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 [facility]) This is a 45 year old male patient with squamous cell carcinoma of the larynx currently on concurrent chemoradiation with cisplatin 100 mg per metered squared dosing started treatment with Dr. Julien Nordmann and Dr. Isidore Moos transferring care.  He received his first cycle of chemotherapy on 12/25/2020 and is here for pretoxicity check before cycle 2 of planned chemotherapy.  He appears to have multiple psychosocial issues, is on chronic pain management with the methadone clinic, follows up with palliative care and also has some social issues, homeless currently living with his mom.   He was recently discharged from the hospital after being treated for bacteremia and currently continues on high-dose amoxicillin.  He will complete his prescription on 01/21/2021.  At this time I recommend deferring chemotherapy until he is done with his antibiotics, he will return to clinic on 1226 with labs and MD visit before his planned second cycle of weekly cisplatin.  At this time given holiday schedule, we will having issues rescheduling the chemotherapy however request has been sent to infusion to reschedule.  Drug abuse and dependence ([place]) He continues to use IV heroin, last dose on 01/11/2021.  I have yearly mentioned that he is a very high risk of infection with ongoing chemotherapy and if he continues this to abuse IV heroin he can die of a life-threatening infection.  He says he understands and he will try to stay clear of heroin.  He will continue follow-up with methadone clinic for heroin addiction.  Leukopenia due to antineoplastic chemotherapy [facility]) It looks like he received some growth factors while he was in the hospital with  neutropenia and bacteremia.  His white blood cell count today is normal.  We will repeat labs next week before chemotherapy.  Chemotherapy induced nausea and vomiting Chemotherapy-induced nausea and vomiting.  Use as needed antinausea medications as prescribed.  Weight loss, unintentional He has lost about 10 pounds since his last visit.  I have clearly recommended to proceed with G-tube but he is very reluctant.  I have tried to explain to him that if he continues to lose weight, he will have a hard time tolerating treatment.  He again refuses it at this time.  He understands the importance of maintaining weight.  No orders of the defined types were placed in this encounter.  Again it was a difficult conversation with him.  He tends to jump from topic to topic and he although may have some understanding of the disease, not entirely clear if he understands the importance of maintaining good weight, or refrain from drug use.  Have tried to explain to him that it is really important to be compliant and follow all recommendations.  Addendum: According to his discussion with the nutritionist team, he is agreeable to proceed with G-tube, this has been ordered.  HISTORY OF PRESENTING ILLNESS:   David [name] 45 y.o. male is here because of squamous cell carcinoma of the larynx.  This is a very pleasant 45 year old male patient who initially presented to the emergency room on November 20, 2020 with right-sided neck swelling for approximately 6 weeks.  He was tried on antibiotics without resolution of symptoms.  He had CT neck performed which revealed asymmetric soft tissue thickening of the right  aspect of the oropharynx extending into the right supraglottis and glottis as well as an enlarged right level 2 lymph node concerning for malignancy.  He then had laryngoscopy which revealed right pyriform sinus completely effaced with ulceration.  Significant edema of the right supraglottis involving the  aryepiglottic fold, noted arytenoid and likely false vocal cord.  Right true vocal cord movement was unable to visualized secondary to supraglottic edema. Fine-needle aspiration of the neck revealed scattered benign squamous cells in the background of blood.  No definitive evidence of dysplasia or carcinoma. Repeat biopsy done on 12/08/2020 showed keratinizing squamous cell carcinoma, p16 negative, lymph node tissue not identified PET/CT done on 12/12/2020 showed right oropharyngeal, hypopharyngeal and supraglottic mass extending down to the level of glottis with a 6.3 cm vertical exertion maximum SUV of 24.0.  Associated bilateral hypermetabolic level 2, right level 3 and right paracentral adenopathy along with the left supraclavicular and right upper thoracic paraesophageal hypermetabolic lymph node.  Faint patchy groundglass opacities in both lungs identified with low-grade activity likely inflammatory.  Staging was determined to be T3 N3 M0  He was started on concurrent chemoradiation with cisplatin 100 mg per metered square every 3 weeks with concurrent radiation under the care of Dr. Isidore Moos and Dr. Julien Nordmann.  Cycle 1 day 1 of cisplatin administered on 12/25/2020  His care was transferred to me since I see most of the head and neck primary here.   According to the notes, patient has several psychosocial concerns related to IV drug use and homelessness.  He goes to methadone clinic for pain management.  He also follows up with palliative care.   He follows up with speech pathology outpatient.   He is here for follow-up prior to second cycle of cisplatin.  Interval History  Since his last visit he was hospitalized with hypotension, bacteremia and is currently on antibiotics.  He visited with his mom today to the appointment.  He is a difficult historian, switches from topic to topic, both he and mom ask questions at the same time so is kind of hard to understand.  He is very reluctant to proceed  with any G-tube placement.   He understands he is already lost 10 pounds in 10 days.  He continues to use IV heroin, last dose on 01/11/2021.  He was admitted to the hospital with hypotension, tachycardia, had blood cultures positive for Streptococcus pyogenes 1 out of 4 he was recommended to take amoxicillin high-dose at the time of discharge for 10 days by infectious disease. Since last visit, he continues to have intermittent nausea, denies vomiting.  Some sore throat, but he is still able to eat, he says he is more worried about choking when he eats his food rather than the pain.  He and his neck mass apparently has shrunk quite a bit according to the patient.  He denies any hearing loss.  He does notice some ringing in his ears intermittently.  No tingling or numbness of his hands or his feet.  Rest of the pertinent 10 point ROS reviewed and negative.  MEDICAL HISTORY:  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[place])    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 ([place])    Pneumothorax    Left lung spontaneous pneumothorax at age 51 yr     SURGICAL HISTORY: Past Surgical History:  Procedure Laterality Date   chest tubes Left     SOCIAL HISTORY: Social History   Socioeconomic History  Marital status: Divorced    Spouse name: Not on file   Number of children: Not on file   Years of education: Not on file   Highest education level: Not on file  Occupational History   Not on file  Tobacco Use   Smoking status: Every Day    Packs/day: 3.00    Types: Cigarettes   Smokeless tobacco: Never  Vaping Use   Vaping Use: Never used  Substance and Sexual Activity   Alcohol use: Not Currently    Alcohol/week: 12.0 standard drinks    Types: 12 Cans of beer per week   Drug use: Not Currently    Types: IV    Comment: 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)   Sexual activity: Not on file  Other Topics Concern   Not on file  Social History Narrative    Not on file   Social Determinants of Health   Financial Resource Strain: Not on file  Food Insecurity: Not on file  Transportation Needs: Not on file  Physical Activity: Not on file  Stress: Not on file  Social Connections: Not on file  Intimate Partner Violence: Not on file    FAMILY HISTORY: No family history on file.  ALLERGIES:  has No Known Allergies.  MEDICATIONS:  Current Outpatient Medications  Medication Sig Dispense Refill   amoxicillin (AMOXIL) 500 MG tablet Take 2 tablets (1,000 mg total) by mouth every 8 (eight) hours for 10 days. 60 tablet 0   clotrimazole (MYCELEX) 10 MG troche Take 1 tablet (10 mg total) by mouth 5 (five) times daily. Suck on these slowly. 70 Troche 0   dexamethasone (DECADRON) 4 MG tablet Take 1 tablet PO BID; take with food. (Patient not taking: Reported on 01/08/2021) 40 tablet 0   dronabinol (MARINOL) 2.5 MG capsule Take 2.5 mg by mouth 2 (two) times daily before a meal.     fluticasone (FLONASE) 50 MCG/ACT nasal spray Place 2 sprays into both nostrils daily. (Patient not taking: Reported on 01/08/2021) 18 mL 2   ibuprofen (ADVIL,MOTRIN) 200 MG tablet Take 600 mg by mouth every 6 (six) hours as needed for moderate pain.     lidocaine (XYLOCAINE) 2 % solution Patient: Mix 1part 2% viscous lidocaine, 1part H20. Swish & swallow 57mL of diluted mixture, 43min before meals and at bedtime, up to QID prn soreness. (Patient taking differently: 15 mLs See admin instructions. Patient: Mix 1part 2% viscous lidocaine, 1part H20. Swish & swallow 47mL of diluted mixture, 42min before meals and at bedtime, up to QID prn soreness.) 200 mL 3   methadone (DOLOPHINE) 10 MG/ML solution Take 6 mLs (60 mg total) by mouth daily. (Patient taking differently: Take 20 mg by mouth every 8 (eight) hours as needed for severe pain.) 90 mL 0   nystatin (MYCOSTATIN) 100000 UNIT/ML suspension Take 5 mLs by mouth 4 (four) times daily. Swish & Swallow     omeprazole (PRILOSEC) 20 MG  capsule Take 1 capsule (20 mg total) by mouth daily. (Patient not taking: Reported on 01/08/2021) 30 capsule 0   ondansetron (ZOFRAN) 8 MG tablet Take 1 tablet (8 mg total) by mouth every 8 (eight) hours as needed for nausea or vomiting. Starting day 3 after chemotherapy (Patient not taking: Reported on 01/08/2021) 30 tablet 2   prochlorperazine (COMPAZINE) 10 MG tablet Take 1 tablet (10 mg total) by mouth every 6 (six) hours as needed. (Patient taking differently: Take 10 mg by mouth every 6 (six) hours as  needed for nausea or vomiting.) 30 tablet 2   No current facility-administered medications for this visit.     PHYSICAL EXAMINATION: ECOG PERFORMANCE STATUS: 1 - Symptomatic but completely ambulatory  Vitals:   01/15/21 1057  BP: 102/73  Pulse: 88  Resp: 18  Temp: 98 F (36.7 C)  SpO2: 99%   Filed Weights   01/15/21 1057  Weight: 113 lb 7 oz (51.5 kg)    GENERAL:alert, no distress and comfortable, appears pale. SKIN: skin color, texture, turgor are normal, no rashes or significant lesions EYES: normal, conjunctiva are pink and non-injected, sclera clear OROPHARYNX:no exudate, no erythema and lips, buccal mucosa, and tongue normal  NECK: supple, thyroid normal size, non-tender, without nodularity LYMPH: Palpable lymphadenopathy in cervical region, more prominent on the right.  According to patient this has significantly decreased in size since starting chemoradiation.  LUNGS: clear to auscultation and percussion with normal breathing effort HEART: regular rate & rhythm and no murmurs and no lower extremity edema ABDOMEN:abdomen soft, non-tender and normal bowel sounds Musculoskeletal:no cyanosis of digits and no clubbing  PSYCH: alert & oriented x 3 with fluent speech NEURO: no focal motor/sensory deficits  LABORATORY DATA:  I have reviewed the data as listed Lab Results  Component Value Date   WBC 5.4 01/15/2021   HGB 11.3 (L) 01/15/2021   HCT 33.7 (L) 01/15/2021   MCV  86.6 01/15/2021   PLT 339 01/15/2021     Chemistry      Component Value Date/Time   NA 138 01/15/2021 1048   K 4.1 01/15/2021 1048   CL 103 01/15/2021 1048   CO2 26 01/15/2021 1048   BUN 28 (H) 01/15/2021 1048   CREATININE 0.82 01/15/2021 1048      Component Value Date/Time   CALCIUM 8.6 (L) 01/15/2021 1048   ALKPHOS 79 01/15/2021 1048   AST 37 01/15/2021 1048   ALT 40 01/15/2021 1048   BILITOT 0.4 01/15/2021 1048       RADIOGRAPHIC STUDIES: I have personally reviewed the radiological images as listed and agreed with the findings in the report. DG Chest Port 1 View  Result Date: 01/08/2021 CLINICAL DATA:  Hypotension EXAM: PORTABLE CHEST 1 VIEW COMPARISON:  02/18/2019 FINDINGS: The heart size and mediastinal contours are within normal limits. Both lungs are clear. The visualized skeletal structures are unremarkable. IMPRESSION: No acute abnormality of the lungs in AP portable projection. Electronically Signed   By: Delanna Ahmadi M.D.   On: 01/08/2021 14:04   DG Swallowing Func-Speech Pathology  Result Date: 01/09/2021 Table formatting from the original result was not included. Objective Swallowing Evaluation: Type of Study: MBS-Modified Barium Swallow Study  Patient Details Name: Destan Franchini MRN: [id number] Date of Birth: Jun 02, 1975 Today's Date: 01/09/2021 Time: SLP Start Time (ACUTE ONLY): 64 -SLP Stop Time (ACUTE ONLY): 1349 SLP Time Calculation (min) (ACUTE ONLY): 34 min Past Medical History: Past Medical History: Diagnosis Date  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[place])   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 ([place])   Pneumothorax   Left lung spontaneous pneumothorax at age 24 yr  Past Surgical History: Past Surgical History: Procedure Laterality Date  chest tubes Left  HPI: Mabel Unrein is a 45 y.o. 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 6 weeks. 11/20/20 CT neck revealed asymmetric soft tissue thickening of the right oropharynx, extending into the right supraglottis and glottis, and enlarged right level 2 lymph node concerning for malignancy, C3-4, C4-5, and C5-6 degenration, after trauma 2016.  Pt has been seen by SLP as an OP for initiation of dysphagia treatment.  Per RN, oral cavity erythemic and pt c/o discomfort.  He will receive 35 fractions of radiation to his Larynx and bilateral neck along with chemotherapy once every 3 weeks ([place]).   He started on 12/18/20 and will complete on 02/08/21.    Per SLP OP note, pt was impulsive with intake but tolerated po.  Subjective: pt awake in flouro chair  Recommendations for follow up therapy are one component of a multi-disciplinary discharge planning process, led by the attending physician.  Recommendations may be updated based on patient status, additional functional criteria and insurance authorization. Assessment / Plan / Recommendation Clinical Impressions 01/09/2021 Clinical Impression Patient presents with moderately severe pharyngeal dysphagia mostly c/b decreased muscular contraction, impaired laryngeal closure/tongue base retraction likely from edema/iatrogenic effects of XRT. 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  Aforementioned deficits allow secretion aspiration, retention across all consistencies and aspiration of liquids and secretions.  Pt did not aspirate solids and retention was not severe.  To mitigate aspiration, recommend dys3/nectar diet - allowing tsps thin water after oral care, single Ice chips anytime.  Pt needs to double swallow with liquids - take SMALL boluses.  He both silently and audibly aspirates and thus following swallow precautions will be prudent for assurance of po tolerance.  Though pt states this is  a new phenomenon, given his decreased awareness to overtly coughing with intake during clinical evaluation, suspect acute on chronic deficits from XRT.  Using teach back with video, educated pt to findings/recommendations. SLP Visit Diagnosis Dysphagia, oropharyngeal phase (R13.12) Attention and concentration deficit following -- Frontal lobe and executive function deficit following -- Impact on safety and function Moderate aspiration risk   Treatment Recommendations 01/09/2021 Treatment Recommendations Therapy as outlined in treatment plan below   Prognosis 01/09/2021 Prognosis for Safe Diet Advancement Good Barriers to Reach Goals Time post onset;Other (Comment);Behavior Barriers/Prognosis Comment -- Diet Recommendations 01/09/2021 SLP Diet Recommendations Dysphagia 3 (Mech soft) solids;Nectar thick liquid Liquid Administration via -- Medication Administration Whole meds with puree Compensations Slow rate;Small sips/bites Postural Changes Remain semi-upright after after feeds/meals (Comment);Seated upright at 90 degrees   Other Recommendations 01/09/2021 Recommended Consults -- Oral Care Recommendations Oral care QID Other Recommendations Have oral suction available Follow Up Recommendations Outpatient SLP Assistance recommended at discharge Frequent or constant Supervision/Assistance Functional Status Assessment Patient has had a recent decline in their functional status and demonstrates the ability to make significant improvements in function in a reasonable and predictable amount of time. Frequency and Duration  01/09/2021 Speech Therapy Frequency (ACUTE ONLY) min 2x/week Treatment Duration 2 weeks   Oral Phase 01/09/2021 Oral Phase WFL Oral - Pudding Teaspoon -- Oral - Pudding Cup -- Oral - Honey Teaspoon -- Oral - Honey Cup -- Oral - Nectar Teaspoon -- Oral - Nectar Cup -- Oral - Nectar Straw -- Oral - Thin Teaspoon -- Oral - Thin Cup -- Oral - Thin Straw -- Oral - Puree -- Oral - Mech Soft -- Oral - Regular  -- Oral - Multi-Consistency -- Oral - Pill -- Oral Phase - Comment --  Pharyngeal Phase 01/09/2021 Pharyngeal Phase Impaired Pharyngeal- Pudding Teaspoon -- Pharyngeal -- Pharyngeal- Pudding Cup -- Pharyngeal -- Pharyngeal- Honey Teaspoon --  Pharyngeal -- Pharyngeal- Honey Cup -- Pharyngeal -- Pharyngeal- Nectar Teaspoon Reduced laryngeal elevation;Reduced airway/laryngeal closure;Reduced tongue base retraction;Reduced epiglottic inversion;Reduced pharyngeal peristalsis Pharyngeal Material enters airway, remains ABOVE vocal cords and not ejected out Pharyngeal- Nectar Cup Penetration/Aspiration during swallow;Reduced laryngeal elevation;Reduced airway/laryngeal closure;Reduced tongue base retraction;Significant aspiration (Amount);Penetration/Apiration after swallow;Reduced pharyngeal peristalsis;Reduced epiglottic inversion Pharyngeal Material enters airway, passes BELOW cords without attempt by patient to eject out (silent aspiration) Pharyngeal- Nectar Straw -- Pharyngeal -- Pharyngeal- Thin Teaspoon Pharyngeal residue - valleculae;Reduced airway/laryngeal closure;Reduced laryngeal elevation;Reduced anterior laryngeal mobility;Penetration/Aspiration during swallow;Reduced epiglottic inversion;Reduced pharyngeal peristalsis;Reduced tongue base retraction Pharyngeal Material enters airway, passes BELOW cords and not ejected out despite cough attempt by patient Pharyngeal- Thin Cup Reduced laryngeal elevation;Reduced airway/laryngeal closure;Reduced tongue base retraction;Pharyngeal residue - valleculae;Pharyngeal residue - pyriform Pharyngeal Material enters airway, passes BELOW cords without attempt by patient to eject out (silent aspiration) Pharyngeal- Thin Straw -- Pharyngeal -- Pharyngeal- Puree Pharyngeal residue - valleculae;Reduced tongue base retraction Pharyngeal Material does not enter airway Pharyngeal- Mechanical Soft Pharyngeal residue - valleculae;Reduced laryngeal elevation;Reduced epiglottic  inversion;Reduced pharyngeal peristalsis;Reduced tongue base retraction Pharyngeal Material does not enter airway Pharyngeal- Regular -- Pharyngeal -- Pharyngeal- Multi-consistency -- Pharyngeal -- Pharyngeal- Pill -- Pharyngeal -- Pharyngeal Comment chin tuck worsened airway protection - as it allowed spillage of barium into open larynx; cued cough propelled barium from proximal esophagus into pharynx/trachea post-swallow- gross amount of aspiration; multiple dry swallows assisted to decrease retention; HOB posterior to less than 45* worsened airway protection as it allowed aspiration into posterior trachea; pt will propel liquid into oral cavity and re-swallow as times without awareness; pt is grossly aspirating secretions and this mixes with barium with subsequent aspiration  Cervical Esophageal Phase  01/09/2021 Cervical Esophageal Phase Impaired Pudding Teaspoon -- Pudding Cup -- Honey Teaspoon -- Honey Cup -- Nectar Teaspoon -- Nectar Cup -- Nectar Straw -- Thin Teaspoon -- Thin Cup -- Thin Straw -- Puree -- Mechanical Soft -- Regular -- Multi-consistency -- Pill -- Cervical Esophageal Comment Upon esophageal sweep, pt appeared with residual throughout - suspect esophageal dysphagia component Kathleen Lime, MS [facility] SLP Acute Rehab Services Office [phone] Pager [phone] Macario Golds 01/09/2021, 3:10 PM                      All questions were answered. The patient knows to call the clinic with any problems, questions or concerns. I spent 60 minutes in the care of this patient including H and P, review of records, counseling and coordination of care.     [name], MD 01/15/2021 1:09 PM

## 2021-01-15 NOTE — Assessment & Plan Note (Signed)
It looks like he received some growth factors while he was in the hospital with neutropenia and bacteremia.  His white blood cell count today is normal.  We will repeat labs next week before chemotherapy.

## 2021-01-15 NOTE — Assessment & Plan Note (Signed)
He continues to use IV heroin, last dose on 01/11/2021.  I have yearly mentioned that he is a very high risk of infection with ongoing chemotherapy and if he continues this to abuse IV heroin he can die of a life-threatening infection.  He says he understands and he will try to stay clear of heroin.  He will continue follow-up with methadone clinic for heroin addiction.

## 2021-01-16 ENCOUNTER — Ambulatory Visit
Admission: RE | Admit: 2021-01-16 | Discharge: 2021-01-16 | Disposition: A | Discharge status: Home / Self Care | Payer: Medicaid Other | Source: Ambulatory Visit | Attending: Radiation Oncology | Admitting: Radiation Oncology

## 2021-01-16 ENCOUNTER — Other Ambulatory Visit: Payer: Self-pay

## 2021-01-16 ENCOUNTER — Telehealth (HOSPITAL_COMMUNITY): Payer: Self-pay

## 2021-01-16 ENCOUNTER — Ambulatory Visit: Payer: Medicaid Other

## 2021-01-16 DIAGNOSIS — C328 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: Secondary | ICD-10-CM

## 2021-01-16 NOTE — Telephone Encounter (Signed)
-----   Message from Sandi Mariscal, MD sent at 01/16/2021  9:17 AM EST ----- Regarding: RE: peg placement OK for percutaneous G-tube placemen (anatomy amenable on PET CT 11/15)  Please have pt drink barium night before procedure (if they are able).  Cathren Harsh    ----- Message ----- From: Danielle Dess Sent: 01/15/2021   2:10 PM EST To: Ir Procedure Requests Subject: peg placement                                  Procedure: Peg placement  Dx: chemo and radiation for head and neck cancer, weight loss  Ordering: Dr. Chryl Heck [phone]  Imaging: NM Pet done 12/12/20  Please review.   Thanks,  Lia Foyer

## 2021-01-17 ENCOUNTER — Ambulatory Visit: Payer: Medicaid Other

## 2021-01-17 ENCOUNTER — Ambulatory Visit
Admission: RE | Admit: 2021-01-17 | Discharge: 2021-01-17 | Disposition: A | Discharge status: Home / Self Care | Payer: Medicaid Other | Source: Ambulatory Visit | Attending: Radiation Oncology | Admitting: Radiation Oncology

## 2021-01-17 ENCOUNTER — Other Ambulatory Visit: Payer: Self-pay

## 2021-01-17 DIAGNOSIS — C328 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: Secondary | ICD-10-CM | POA: Diagnosis not present

## 2021-01-18 ENCOUNTER — Ambulatory Visit
Admission: RE | Admit: 2021-01-18 | Discharge: 2021-01-18 | Disposition: A | Discharge status: Home / Self Care | Payer: Medicaid Other | Source: Ambulatory Visit | Attending: Radiation Oncology | Admitting: Radiation Oncology

## 2021-01-18 ENCOUNTER — Ambulatory Visit: Payer: Medicaid Other

## 2021-01-18 ENCOUNTER — Inpatient Hospital Stay: Payer: Medicaid Other | Admitting: Nutrition

## 2021-01-18 ENCOUNTER — Telehealth: Payer: Self-pay | Admitting: Hematology and Oncology

## 2021-01-18 DIAGNOSIS — C328 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: Secondary | ICD-10-CM | POA: Diagnosis not present

## 2021-01-18 NOTE — Progress Notes (Signed)
Nutrition follow-up completed with patient and his mother.  Patient receiving radiation therapy for stage IV [place] cancer. No new weight documented.  Patient weighed 113.4 pounds December 19.  Patient reports he decided not to get the feeding tube.  Reports he still continues to have no taste.  The phlegm is getting thicker and more difficult to clear.  Patient reports he did get suction machine and that is helping.  Estimated nutrition needs: 2265-2500 cal, 113-128 g protein, 2.3 L fluid.  Nutrition diagnosis: Inadequate oral intake continues. Severe malnutrition ongoing.  Intervention: Educated patient on strategies for improving thick saliva. Recommended patient consume 6 cartons of Ensure plus or equivalent.  Provided 1 complementary case of Ensure Enlive and coupons. 6 cartons of Ensure Plus or equivalent provides approximately 2100 cal, 90 g of protein. Also encourage patient to continue soft foods and other liquids as needed.  Monitoring, evaluation, goals: Patient will work to increase calories and protein to minimize weight loss.  Next visit: Thursday, December 29 before radiation.  **Disclaimer: This note was dictated with voice recognition software. Similar sounding words can inadvertently be transcribed and this note may contain transcription errors which may not have been corrected upon publication of note.**

## 2021-01-18 NOTE — Telephone Encounter (Signed)
Sch per 12/19, pt aware

## 2021-01-19 ENCOUNTER — Ambulatory Visit: Payer: Medicaid Other

## 2021-01-19 ENCOUNTER — Other Ambulatory Visit: Payer: Self-pay

## 2021-01-19 ENCOUNTER — Ambulatory Visit
Admission: RE | Admit: 2021-01-19 | Discharge: 2021-01-19 | Disposition: A | Discharge status: Home / Self Care | Payer: Medicaid Other | Source: Ambulatory Visit | Attending: Radiation Oncology | Admitting: Radiation Oncology

## 2021-01-19 ENCOUNTER — Ambulatory Visit (HOSPITAL_COMMUNITY): Admission: RE | Admit: 2021-01-19 | Payer: Medicaid Other | Source: Ambulatory Visit

## 2021-01-19 ENCOUNTER — Other Ambulatory Visit: Payer: Self-pay | Admitting: Radiation Oncology

## 2021-01-19 DIAGNOSIS — C328 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: Secondary | ICD-10-CM | POA: Diagnosis not present

## 2021-01-19 NOTE — Telephone Encounter (Signed)
Refills of dexamethasone and omeprazole denied - these medications served their purpose when he needed steroids for airway restriction. Tumor is regressing and therefore these meds are not appropriate any longer.

## 2021-01-20 LAB — CULTURE, BLOOD (ROUTINE X 2)
Culture: NO GROWTH
Special Requests: ADEQUATE

## 2021-01-23 ENCOUNTER — Inpatient Hospital Stay (HOSPITAL_BASED_OUTPATIENT_CLINIC_OR_DEPARTMENT_OTHER): Payer: Medicaid Other | Admitting: Hematology and Oncology

## 2021-01-23 ENCOUNTER — Inpatient Hospital Stay: Payer: Medicaid Other

## 2021-01-23 ENCOUNTER — Ambulatory Visit
Admission: RE | Admit: 2021-01-23 | Discharge: 2021-01-23 | Disposition: A | Discharge status: Home / Self Care | Payer: Medicaid Other | Source: Ambulatory Visit | Attending: Radiation Oncology | Admitting: Radiation Oncology

## 2021-01-23 ENCOUNTER — Other Ambulatory Visit: Payer: Self-pay

## 2021-01-23 ENCOUNTER — Ambulatory Visit: Payer: Medicaid Other

## 2021-01-23 ENCOUNTER — Encounter: Payer: Self-pay | Admitting: Hematology and Oncology

## 2021-01-23 ENCOUNTER — Other Ambulatory Visit: Payer: Self-pay | Admitting: Radiation Oncology

## 2021-01-23 DIAGNOSIS — C328 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: Secondary | ICD-10-CM

## 2021-01-23 DIAGNOSIS — Z5111 Encounter for antineoplastic chemotherapy: Secondary | ICD-10-CM | POA: Insufficient documentation

## 2021-01-23 DIAGNOSIS — R112 Nausea with vomiting, unspecified: Secondary | ICD-10-CM | POA: Diagnosis not present

## 2021-01-23 DIAGNOSIS — Z79899 Other long term (current) drug therapy: Secondary | ICD-10-CM | POA: Insufficient documentation

## 2021-01-23 DIAGNOSIS — T451X5A Adverse effect of antineoplastic and immunosuppressive drugs, initial encounter: Secondary | ICD-10-CM

## 2021-01-23 DIAGNOSIS — Z923 Personal history of irradiation: Secondary | ICD-10-CM | POA: Insufficient documentation

## 2021-01-23 DIAGNOSIS — F1920 Other psychoactive substance dependence, uncomplicated: Secondary | ICD-10-CM

## 2021-01-23 DIAGNOSIS — R634 Abnormal weight loss: Secondary | ICD-10-CM | POA: Diagnosis not present

## 2021-01-23 LAB — CBC WITH DIFFERENTIAL (CANCER CENTER ONLY)
Abs Immature Granulocytes: 0.04 10*3/uL (ref 0.00–0.07)
Basophils Absolute: 0 10*3/uL (ref 0.0–0.1)
Basophils Relative: 0 %
Eosinophils Absolute: 0 10*3/uL (ref 0.0–0.5)
Eosinophils Relative: 0 %
HCT: 35.1 % — ABNORMAL LOW (ref 39.0–52.0)
Hemoglobin: 11.4 g/dL — ABNORMAL LOW (ref 13.0–17.0)
Immature Granulocytes: 1 %
Lymphocytes Relative: 5 %
Lymphs Abs: 0.4 10*3/uL — ABNORMAL LOW (ref 0.7–4.0)
MCH: 28.4 pg (ref 26.0–34.0)
MCHC: 32.5 g/dL (ref 30.0–36.0)
MCV: 87.5 fL (ref 80.0–100.0)
Monocytes Absolute: 1 10*3/uL (ref 0.1–1.0)
Monocytes Relative: 11 %
Neutro Abs: 7.1 10*3/uL (ref 1.7–7.7)
Neutrophils Relative %: 83 %
Platelet Count: 306 10*3/uL (ref 150–400)
RBC: 4.01 MIL/uL — ABNORMAL LOW (ref 4.22–5.81)
RDW: 17.1 % — ABNORMAL HIGH (ref 11.5–15.5)
WBC Count: 8.5 10*3/uL (ref 4.0–10.5)
nRBC: 0 % (ref 0.0–0.2)

## 2021-01-23 LAB — CMP (CANCER CENTER ONLY)
ALT: 22 U/L (ref 0–44)
AST: 24 U/L (ref 15–41)
Albumin: 3.3 g/dL — ABNORMAL LOW (ref 3.5–5.0)
Alkaline Phosphatase: 66 U/L (ref 38–126)
Anion gap: 6 (ref 5–15)
BUN: 14 mg/dL (ref 6–20)
CO2: 31 mmol/L (ref 22–32)
Calcium: 9.1 mg/dL (ref 8.9–10.3)
Chloride: 97 mmol/L — ABNORMAL LOW (ref 98–111)
Creatinine: 0.7 mg/dL (ref 0.61–1.24)
GFR, Estimated: >60 mL/min (ref >60)
Glucose, Bld: 119 mg/dL — ABNORMAL HIGH (ref 70–99)
Potassium: 4 mmol/L (ref 3.5–5.1)
Sodium: 134 mmol/L — ABNORMAL LOW (ref 135–145)
Total Bilirubin: 0.4 mg/dL (ref 0.3–1.2)
Total Protein: 7.6 g/dL (ref 6.5–8.1)

## 2021-01-23 LAB — MAGNESIUM: Magnesium: 1.9 mg/dL (ref 1.7–2.4)

## 2021-01-23 MED ORDER — LIDOCAINE VISCOUS HCL 2 % MT SOLN: OROMUCOSAL | 2 refills | Status: DC

## 2021-01-23 MED ORDER — SUCRALFATE 1 G PO TABS: 1.0000 g | ORAL_TABLET | Freq: Three times a day (TID) | ORAL | 1 refills | Status: DC

## 2021-01-23 NOTE — Assessment & Plan Note (Signed)
His weight has remained stable since last visit.  Again he goes back and forth between wanting a G-tube and not wanting a G-tube.  I have encouraged him to eat as much as he can and stay hydrated.  Again if he decides to proceed with a G-tube, we will try to facilitate it as soon as possible.

## 2021-01-23 NOTE — Assessment & Plan Note (Signed)
Chemotherapy-induced nausea and vomiting.  This has significantly improved compared to last visit.  Use as needed antinausea medications as prescribed.

## 2021-01-23 NOTE — Assessment & Plan Note (Addendum)
He denies any use of heroin since his last visit.  We will continue to counsel him about this and monitor his use.  He continues to follow-up with methadone clinic for opioid needs.

## 2021-01-23 NOTE — Assessment & Plan Note (Signed)
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 Julien Nordmann and Dr. Isidore Moos transferring care.  He received his first cycle of chemotherapy on 12/25/2020 and is here for follow-up for second cycle of cisplatin.  He was due for this last week however since he was on antibiotics and recently had bacteremia, we had to postpone it.  Since his last visit, he feels better except for mucositis and fatigue.  Nausea and vomiting have improved. He is almost done with his antibiotic, last dose today or tomorrow. Labs from today appear satisfactory to proceed with chemotherapy planned for tomorrow. He we will proceed with cycle 2-day 1 as anticipated.  He appears to have significant clinical response so far with rapid resolution of lymphadenopathy. Return to clinic with [name] in 1 week.

## 2021-01-23 NOTE — Progress Notes (Signed)
[place] CONSULT NOTE  Patient Care Team: Default, Provider, MD as PCP - General  CHIEF COMPLAINTS/PURPOSE OF CONSULTATION:  SCC of overlapping sites of larynx, here for pre chemo visit.  ASSESSMENT & PLAN:   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 [name]) This is a 46 year old male patient with squamous cell carcinoma of the larynx currently on concurrent chemoradiation with cisplatin 100 mg per metered squared dosing started treatment with Dr. Julien Nordmann and Dr. Isidore Moos transferring care.  He received his first cycle of chemotherapy on 12/25/2020 and is here for follow-up for second cycle of cisplatin.  He was due for this last week however since he was on antibiotics and recently had bacteremia, we had to postpone it.  Since his last visit, he feels better except for mucositis and fatigue.  Nausea and vomiting have improved. He is almost done with his antibiotic, last dose today or tomorrow. Labs from today appear satisfactory to proceed with chemotherapy planned for tomorrow. He we will proceed with cycle 2-day 1 as anticipated.  He appears to have significant clinical response so far with rapid resolution of lymphadenopathy. Return to clinic with [name] in 1 week.  Chemotherapy induced nausea and vomiting Chemotherapy-induced nausea and vomiting.  This has significantly improved compared to last visit.  Use as needed antinausea medications as prescribed.  Weight loss, unintentional His weight has remained stable since last visit.  Again he goes back and forth between wanting a G-tube and not wanting a G-tube.  I have encouraged him to eat as much as he can and stay hydrated.  Again if he decides to proceed with a G-tube, we will try to facilitate it as soon as possible.  Drug abuse and dependence [facility]) He denies any use of heroin since his last visit.  We will continue to counsel him about this and monitor his use.  He continues to follow-up with methadone clinic for  opioid needs.   No orders of the defined types were placed in this encounter.  I have discussed about presence of upper paraesophageal lymph nodes today with him.  I told him that Dr. Isidore Moos hope to give him the benefit of doubt and recommended concurrent chemoradiation.  By the time he was transferred, he already received first cycle of cisplatin.  I do agree this is reasonable approach but again explained to him that he is definitely at high risk of metastatic disease.  He expressed understanding of the recommendations   HISTORY OF PRESENTING ILLNESS:   David [name] 45 y.o. male is here because of squamous cell carcinoma of the larynx.  This is a very pleasant 45 year old male patient who initially presented to the emergency room on November 20, 2020 with right-sided neck swelling for approximately 6 weeks.  He was tried on antibiotics without resolution of symptoms.  He had CT neck performed which revealed asymmetric soft tissue thickening of the right aspect of the oropharynx extending into the right supraglottis and glottis as well as an enlarged right level 2 lymph node concerning for malignancy.  He then had laryngoscopy which revealed right pyriform sinus completely effaced with ulceration.  Significant edema of the right supraglottis involving the aryepiglottic fold, noted arytenoid and likely false vocal cord.  Right true vocal cord movement was unable to visualized secondary to supraglottic edema. Fine-needle aspiration of the neck revealed scattered benign squamous cells in the background of blood.  No definitive evidence of dysplasia or carcinoma. Repeat biopsy done on 12/08/2020 showed keratinizing squamous  cell carcinoma, p16 negative, lymph node tissue not identified PET/CT done on 12/12/2020 showed right oropharyngeal, hypopharyngeal and supraglottic mass extending down to the level of glottis with a 6.3 cm vertical exertion maximum SUV of 24.0.  Associated bilateral  hypermetabolic level 2, right level 3 and right paracentral adenopathy along with the left supraclavicular and right upper thoracic paraesophageal hypermetabolic lymph node.  Faint patchy groundglass opacities in both lungs identified with low-grade activity likely inflammatory.  Staging was determined to be T3 N3 M0  He was started on concurrent chemoradiation with cisplatin 100 mg per metered square every 3 weeks with concurrent radiation under the care of Dr. Isidore Moos and Dr. Julien Nordmann.  Cycle 1 day 1 of cisplatin administered on 12/25/2020  His care was transferred to me since I see most of the head and neck primary here.    Interval History  He is very tired since his last visit.  He was delayed because of his radiation appointment this afternoon. Since last visit, his nausea and vomiting is better. Sorethroat is worse, he had to take more lidocaine,  taking methadone daily Weight is steady, he is eating ok. He cant taste anything, he is trying to eat everything he can eat. No tingling or numbness of his hands or his feet. No ringing or hearing loss. Rest of the pertinent 10 point ROS reviewed and negative.  MEDICAL HISTORY:  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[name])    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 ([name])    Pneumothorax    Left lung spontaneous pneumothorax at age 65 yr     SURGICAL HISTORY: Past Surgical History:  Procedure Laterality Date   chest tubes Left     SOCIAL HISTORY: Social History   Socioeconomic History   Marital status: Divorced    Spouse name: Not on file   Number of children: Not on file   Years of education: Not on file   Highest education level: Not on file  Occupational History   Not on file  Tobacco Use   Smoking status: Every Day    Packs/day: 3.00    Types: Cigarettes   Smokeless tobacco: Never  Vaping Use   Vaping Use: Never used  Substance and Sexual Activity   Alcohol use: Not Currently     Alcohol/week: 12.0 standard drinks    Types: 12 Cans of beer per week   Drug use: Not Currently    Types: IV    Comment: 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  ALLERGIES:  has No Known Allergies.  MEDICATIONS:  Current Outpatient Medications  Medication Sig Dispense Refill   clotrimazole (MYCELEX) 10 MG troche Take 1 tablet (10 mg total) by mouth 5 (five) times daily. Suck on these slowly. 70 Troche 0   dexamethasone (DECADRON) 4 MG tablet Take 1 tablet PO BID; take with food. (Patient not taking: Reported on 01/08/2021) 40 tablet 0   dronabinol (MARINOL) 2.5 MG capsule Take 2.5 mg  by mouth 2 (two) times daily before a meal.     fluticasone (FLONASE) 50 MCG/ACT nasal spray Place 2 sprays into both nostrils daily. (Patient not taking: Reported on 01/08/2021) 18 mL 2   ibuprofen (ADVIL,MOTRIN) 200 MG tablet Take 600 mg by mouth every 6 (six) hours as needed for moderate pain.     lidocaine (XYLOCAINE) 2 % solution Patient: Mix 1part 2% viscous lidocaine, 1part H20. Swish/swallow 62mL of diluted mixture, 13min before meals and @bedtime , up to QID prn soreness 200 mL 2   methadone (DOLOPHINE) 10 MG/ML solution Take 6 mLs (60 mg total) by mouth daily. (Patient taking differently: Take 20 mg by mouth every 8 (eight) hours as needed for severe pain.) 90 mL 0   nystatin (MYCOSTATIN) 100000 UNIT/ML suspension Take 5 mLs by mouth 4 (four) times daily. Swish & Swallow     omeprazole (PRILOSEC) 20 MG capsule Take 1 capsule (20 mg total) by mouth daily. (Patient not  taking: Reported on 01/08/2021) 30 capsule 0   ondansetron (ZOFRAN) 8 MG tablet Take 1 tablet (8 mg total) by mouth every 8 (eight) hours as needed for nausea or vomiting. Starting day 3 after chemotherapy (Patient not taking: Reported on 01/08/2021) 30 tablet 2   prochlorperazine (COMPAZINE) 10 MG tablet Take 1 tablet (10 mg total) by mouth every 6 (six) hours as needed. (Patient taking differently: Take 10 mg by mouth every 6 (six) hours as needed for nausea or vomiting.) 30 tablet 2   No current facility-administered medications for this visit.     PHYSICAL EXAMINATION: ECOG PERFORMANCE STATUS: 1 - Symptomatic but completely ambulatory  There were no vitals filed for this visit.   Vital signs reviewed and stable, blood pressure 120/96, pulse 92, temperature of 98, oxygen 99%, weight of 113.7  GENERAL:alert, no distress and comfortable, appears cachectic and tired. SKIN: Radiation-induced skin changes on the neck, no evidence of infection EYES: normal, conjunctiva are pink and non-injected, sclera clear OROPHARYNX: Mucositis noted, moderate NECK: supple, thyroid normal size, non-tender, without nodularity LYMPH: Palpable lymphadenopathy in cervical region, less prominent compared to last visit LUNGS: clear to auscultation and percussion with normal breathing effort HEART: regular rate & rhythm and no murmurs and no lower extremity edema ABDOMEN:abdomen soft, non-tender and normal bowel sounds Musculoskeletal:no cyanosis of digits and no clubbing  PSYCH: alert & oriented x 3 with fluent speech NEURO: no focal motor/sensory deficits  LABORATORY DATA:  I have reviewed the data as listed Lab Results  Component Value Date   WBC 8.5 01/23/2021   HGB 11.4 (L) 01/23/2021   HCT 35.1 (L) 01/23/2021   MCV 87.5 01/23/2021   PLT 306 01/23/2021     Chemistry      Component Value Date/Time   NA 134 (L) 01/23/2021 1448   K 4.0 01/23/2021 1448   CL 97 (L) 01/23/2021 1448   CO2 31  01/23/2021 1448   BUN 14 01/23/2021 1448   CREATININE 0.70 01/23/2021 1448      Component Value Date/Time   CALCIUM 9.1 01/23/2021 1448   ALKPHOS 66 01/23/2021 1448   AST 24 01/23/2021 1448   ALT 22 01/23/2021 1448   BILITOT 0.4 01/23/2021 1448       RADIOGRAPHIC STUDIES: I have personally reviewed the radiological images as listed and agreed with the findings in the report. DG Chest Port 1 View  Result Date: 01/08/2021 CLINICAL DATA:  Hypotension EXAM: PORTABLE CHEST 1 VIEW COMPARISON:  02/18/2019 FINDINGS: The heart size and mediastinal  contours are within normal limits. Both lungs are clear. The visualized skeletal structures are unremarkable. IMPRESSION: No acute abnormality of the lungs in AP portable projection. Electronically Signed   By: Delanna Ahmadi M.D.   On: 01/08/2021 14:04   DG Swallowing Func-Speech Pathology  Result Date: 01/09/2021 Table formatting from the original result was not included. Objective Swallowing Evaluation: Type of Study: MBS-Modified Barium Swallow Study  Patient Details Name: David [name] MRN: [id number] Date of Birth: Aug 12, 1975 Today's Date: 01/09/2021 Time: SLP Start Time (ACUTE ONLY): 66 -SLP Stop Time (ACUTE ONLY): 1349 SLP Time Calculation (min) (ACUTE ONLY): 34 min Past Medical History: Past Medical History: Diagnosis Date  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[place])   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 ([place])   Pneumothorax   Left lung spontaneous pneumothorax at age 55 yr  Past Surgical History: Past Surgical History: Procedure Laterality Date  chest tubes Left  HPI: Eleftherios Dudenhoeffer is a 45 y.o. 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 6 weeks. 11/20/20 CT neck  revealed asymmetric soft tissue thickening of the right oropharynx, extending into the right supraglottis and glottis, and enlarged right level 2 lymph node concerning for malignancy, C3-4, C4-5, and C5-6 degenration, after trauma 2016.  Pt has been seen by SLP as an OP for initiation of dysphagia treatment.  Per RN, oral cavity erythemic and pt c/o discomfort.  He will receive 35 fractions of radiation to his Larynx and bilateral neck along with chemotherapy once every 3 weeks ([place]).   He started on 12/18/20 and will complete on 02/08/21.    Per SLP OP note, pt was impulsive with intake but tolerated po.  Subjective: pt awake in flouro chair  Recommendations for follow up therapy are one component of a multi-disciplinary discharge planning process, led by the attending physician.  Recommendations may be updated based on patient status, additional functional criteria and insurance authorization. Assessment / Plan / Recommendation Clinical Impressions 01/09/2021 Clinical Impression Patient presents with moderately severe pharyngeal dysphagia mostly c/b decreased muscular contraction, impaired laryngeal closure/tongue base retraction likely from edema/iatrogenic effects of XRT. 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  Aforementioned deficits allow secretion aspiration, retention across all consistencies and aspiration of liquids and secretions.  Pt did not aspirate solids and retention was not severe.  To mitigate aspiration, recommend dys3/nectar diet - allowing tsps thin water after oral care, single Ice chips anytime.  Pt needs to double swallow with liquids - take SMALL boluses.  He both silently and audibly aspirates and thus following swallow precautions will be prudent for assurance of po tolerance.  Though pt states this is a new phenomenon, given his decreased awareness to overtly coughing with intake during clinical evaluation, suspect acute on chronic deficits from XRT.  Using teach back with  video, educated pt to findings/recommendations. SLP Visit Diagnosis Dysphagia, oropharyngeal phase (R13.12) Attention and concentration deficit following -- Frontal lobe and executive function deficit following -- Impact on safety and function Moderate aspiration risk   Treatment Recommendations 01/09/2021 Treatment Recommendations Therapy as outlined in treatment plan below   Prognosis 01/09/2021 Prognosis for Safe Diet Advancement Good Barriers to Reach Goals Time post onset;Other (Comment);Behavior Barriers/Prognosis Comment -- Diet Recommendations 01/09/2021 SLP Diet Recommendations Dysphagia 3 (Mech soft) solids;Nectar thick  liquid Liquid Administration via -- Medication Administration Whole meds with puree Compensations Slow rate;Small sips/bites Postural Changes Remain semi-upright after after feeds/meals (Comment);Seated upright at 90 degrees   Other Recommendations 01/09/2021 Recommended Consults -- Oral Care Recommendations Oral care QID Other Recommendations Have oral suction available Follow Up Recommendations Outpatient SLP Assistance recommended at discharge Frequent or constant Supervision/Assistance Functional Status Assessment Patient has had a recent decline in their functional status and demonstrates the ability to make significant improvements in function in a reasonable and predictable amount of time. Frequency and Duration  01/09/2021 Speech Therapy Frequency (ACUTE ONLY) min 2x/week Treatment Duration 2 weeks   Oral Phase 01/09/2021 Oral Phase WFL Oral - Pudding Teaspoon -- Oral - Pudding Cup -- Oral - Honey Teaspoon -- Oral - Honey Cup -- Oral - Nectar Teaspoon -- Oral - Nectar Cup -- Oral - Nectar Straw -- Oral - Thin Teaspoon -- Oral - Thin Cup -- Oral - Thin Straw -- Oral - Puree -- Oral - Mech Soft -- Oral - Regular -- Oral - Multi-Consistency -- Oral - Pill -- Oral Phase - Comment --  Pharyngeal Phase 01/09/2021 Pharyngeal Phase Impaired Pharyngeal- Pudding Teaspoon -- Pharyngeal --  Pharyngeal- Pudding Cup -- Pharyngeal -- Pharyngeal- Honey Teaspoon -- Pharyngeal -- Pharyngeal- Honey Cup -- Pharyngeal -- Pharyngeal- Nectar Teaspoon Reduced laryngeal elevation;Reduced airway/laryngeal closure;Reduced tongue base retraction;Reduced epiglottic inversion;Reduced pharyngeal peristalsis Pharyngeal Material enters airway, remains ABOVE vocal cords and not ejected out Pharyngeal- Nectar Cup Penetration/Aspiration during swallow;Reduced laryngeal elevation;Reduced airway/laryngeal closure;Reduced tongue base retraction;Significant aspiration (Amount);Penetration/Apiration after swallow;Reduced pharyngeal peristalsis;Reduced epiglottic inversion Pharyngeal Material enters airway, passes BELOW cords without attempt by patient to eject out (silent aspiration) Pharyngeal- Nectar Straw -- Pharyngeal -- Pharyngeal- Thin Teaspoon Pharyngeal residue - valleculae;Reduced airway/laryngeal closure;Reduced laryngeal elevation;Reduced anterior laryngeal mobility;Penetration/Aspiration during swallow;Reduced epiglottic inversion;Reduced pharyngeal peristalsis;Reduced tongue base retraction Pharyngeal Material enters airway, passes BELOW cords and not ejected out despite cough attempt by patient Pharyngeal- Thin Cup Reduced laryngeal elevation;Reduced airway/laryngeal closure;Reduced tongue base retraction;Pharyngeal residue - valleculae;Pharyngeal residue - pyriform Pharyngeal Material enters airway, passes BELOW cords without attempt by patient to eject out (silent aspiration) Pharyngeal- Thin Straw -- Pharyngeal -- Pharyngeal- Puree Pharyngeal residue - valleculae;Reduced tongue base retraction Pharyngeal Material does not enter airway Pharyngeal- Mechanical Soft Pharyngeal residue - valleculae;Reduced laryngeal elevation;Reduced epiglottic inversion;Reduced pharyngeal peristalsis;Reduced tongue base retraction Pharyngeal Material does not enter airway Pharyngeal- Regular -- Pharyngeal -- Pharyngeal-  Multi-consistency -- Pharyngeal -- Pharyngeal- Pill -- Pharyngeal -- Pharyngeal Comment chin tuck worsened airway protection - as it allowed spillage of barium into open larynx; cued cough propelled barium from proximal esophagus into pharynx/trachea post-swallow- gross amount of aspiration; multiple dry swallows assisted to decrease retention; HOB posterior to less than 45* worsened airway protection as it allowed aspiration into posterior trachea; pt will propel liquid into oral cavity and re-swallow as times without awareness; pt is grossly aspirating secretions and this mixes with barium with subsequent aspiration  Cervical Esophageal Phase  01/09/2021 Cervical Esophageal Phase Impaired Pudding Teaspoon -- Pudding Cup -- Honey Teaspoon -- Honey Cup -- Nectar Teaspoon -- Nectar Cup -- Nectar Straw -- Thin Teaspoon -- Thin Cup -- Thin Straw -- Puree -- Mechanical Soft -- Regular -- Multi-consistency -- Pill -- Cervical Esophageal Comment Upon esophageal sweep, pt appeared with residual throughout - suspect esophageal dysphagia component Kathleen Lime, MS [facility] SLP Acute Rehab Services Office [phone] Pager [phone] Macario Golds 01/09/2021, 3:10 PM  All questions were answered. The patient knows to call the clinic with any problems, questions or concerns. I spent 30 minutes in the care of this patient including H and P, review of records, counseling and coordination of care.     [name], MD 01/23/2021 5:01 PM

## 2021-01-24 ENCOUNTER — Ambulatory Visit
Admission: RE | Admit: 2021-01-24 | Discharge: 2021-01-24 | Disposition: A | Discharge status: Home / Self Care | Payer: Medicaid Other | Source: Ambulatory Visit | Attending: Radiation Oncology | Admitting: Radiation Oncology

## 2021-01-24 ENCOUNTER — Ambulatory Visit: Payer: Medicaid Other

## 2021-01-24 ENCOUNTER — Inpatient Hospital Stay: Payer: Medicaid Other

## 2021-01-24 VITALS — BP 126/84 | HR 84 | Temp 98.3°F | Resp 18 | Wt 111.8 lb

## 2021-01-24 DIAGNOSIS — C328 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: Secondary | ICD-10-CM

## 2021-01-24 MED ORDER — SODIUM CHLORIDE 0.9 % IV SOLN
10.0000 mg | Freq: Once | INTRAVENOUS | Status: AC
  Administered 2021-01-24: 12:00:00 10 mg via INTRAVENOUS
  Filled 2021-01-24: qty 10

## 2021-01-24 MED ORDER — MAGNESIUM SULFATE 2 GM/50ML IV SOLN
2.0000 g | Freq: Once | INTRAVENOUS | Status: AC
  Administered 2021-01-24: 08:00:00 2 g via INTRAVENOUS
  Filled 2021-01-24: qty 50

## 2021-01-24 MED ORDER — PALONOSETRON HCL INJECTION 0.25 MG/5ML
0.2500 mg | Freq: Once | INTRAVENOUS | Status: AC
  Administered 2021-01-24: 12:00:00 0.25 mg via INTRAVENOUS
  Filled 2021-01-24: qty 5

## 2021-01-24 MED ORDER — SODIUM CHLORIDE 0.9 % IV SOLN
Freq: Once | INTRAVENOUS | Status: AC
Start: 2021-01-24 — End: 2021-01-24

## 2021-01-24 MED ORDER — SODIUM CHLORIDE 0.9 % IV SOLN: Freq: Once | INTRAVENOUS | Status: AC

## 2021-01-24 MED ORDER — POTASSIUM CHLORIDE IN NACL 20-0.9 MEQ/L-% IV SOLN
Freq: Once | INTRAVENOUS | Status: AC
  Filled 2021-01-24: qty 1000

## 2021-01-24 MED ORDER — SODIUM CHLORIDE 0.9 % IV SOLN
150.0000 mg | Freq: Once | INTRAVENOUS | Status: AC
  Administered 2021-01-24: 12:00:00 150 mg via INTRAVENOUS
  Filled 2021-01-24: qty 150

## 2021-01-24 MED ORDER — SODIUM CHLORIDE 0.9 % IV SOLN
100.0000 mg/m2 | Freq: Once | INTRAVENOUS | Status: AC
  Administered 2021-01-24: 13:00:00 167 mg via INTRAVENOUS
  Filled 2021-01-24: qty 167

## 2021-01-24 NOTE — Progress Notes (Signed)
Ok to proceed without 241mls of urine per Dr.Iruku

## 2021-01-24 NOTE — Patient Instructions (Signed)
[facility]  Discharge Instructions: Thank you for choosing [facility] to provide your oncology and hematology care.   If you have a lab appointment with the Cancer Center, please go directly to the Cancer Center and check in at the registration area.   Wear comfortable clothing and clothing appropriate for easy access to any Portacath or PICC line.   We strive to give you quality time with your provider. You may need to reschedule your appointment if you arrive late (15 or more minutes).  Arriving late affects you and other patients whose appointments are after yours.  Also, if you miss three or more appointments without notifying the office, you may be dismissed from the clinic at the provider's discretion.      For prescription refill requests, have your pharmacy contact our office and allow 72 hours for refills to be completed.    Today you received the following chemotherapy and/or immunotherapy agents cisplatin   To help prevent nausea and vomiting after your treatment, we encourage you to take your nausea medication as directed.  BELOW ARE SYMPTOMS THAT SHOULD BE REPORTED IMMEDIATELY: *FEVER GREATER THAN 100.4 F (38 C) OR HIGHER *CHILLS OR SWEATING *NAUSEA AND VOMITING THAT IS NOT CONTROLLED WITH YOUR NAUSEA MEDICATION *UNUSUAL SHORTNESS OF BREATH *UNUSUAL BRUISING OR BLEEDING *URINARY PROBLEMS (pain or burning when urinating, or frequent urination) *BOWEL PROBLEMS (unusual diarrhea, constipation, pain near the anus) TENDERNESS IN MOUTH AND THROAT WITH OR WITHOUT PRESENCE OF ULCERS (sore throat, sores in mouth, or a toothache) UNUSUAL RASH, SWELLING OR PAIN  UNUSUAL VAGINAL DISCHARGE OR ITCHING   Items with * indicate a potential emergency and should be followed up as soon as possible or go to the Emergency Department if any problems should occur.  Please show the CHEMOTHERAPY ALERT CARD or IMMUNOTHERAPY ALERT CARD at check-in to the  Emergency Department and triage nurse.  Should you have questions after your visit or need to cancel or reschedule your appointment, please contact [facility]  Dept: [phone]  and follow the prompts.  Office hours are 8:00 a.m. to 4:30 p.m. Monday - Friday. Please note that voicemails left after 4:00 p.m. may not be returned until the following business day.  We are closed weekends and major holidays. You have access to a nurse at all times for urgent questions. Please call the main number to the clinic Dept: [phone] and follow the prompts.   For any non-urgent questions, you may also contact your provider using MyChart. We now offer e-Visits for anyone 18 and older to request care online for non-urgent symptoms. For details visit [email or web address].   Also download the MyChart app! Go to the app store, search "MyChart", open the app, select [facility], and log in with your MyChart username and password.  Due to Covid, a mask is required upon entering the hospital/clinic. If you do not have a mask, one will be given to you upon arrival. For doctor visits, patients may have 1 support person aged 18 or older with them. For treatment visits, patients cannot have anyone with them due to current Covid guidelines and our immunocompromised population.   

## 2021-01-25 ENCOUNTER — Ambulatory Visit: Payer: Medicaid Other

## 2021-01-25 ENCOUNTER — Ambulatory Visit
Admission: RE | Admit: 2021-01-25 | Discharge: 2021-01-25 | Disposition: A | Discharge status: Home / Self Care | Payer: Medicaid Other | Source: Ambulatory Visit | Attending: Radiation Oncology | Admitting: Radiation Oncology

## 2021-01-25 ENCOUNTER — Encounter: Payer: Self-pay | Admitting: Internal Medicine

## 2021-01-25 ENCOUNTER — Other Ambulatory Visit: Payer: Self-pay

## 2021-01-25 ENCOUNTER — Inpatient Hospital Stay: Payer: Medicaid Other | Admitting: Nutrition

## 2021-01-25 DIAGNOSIS — C328 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: Secondary | ICD-10-CM | POA: Diagnosis not present

## 2021-01-25 NOTE — Progress Notes (Signed)
Patient status post cycle 2 of chemotherapy for [place] cancer.  Patient also receives radiation therapy.  Patient has declined a feeding tube.  Weight decreased to 111 pounds from 113.4 pounds December 19.  Brief nutrition follow-up completed with patient who reports no changes in oral intake.  He continues to eat soft moist foods.  He still has very little taste but is forcing himself to eat what he can.   He is drinking 1 bottle of Ensure Enlive daily.   He continues to have mucositis and increased fatigue.  Estimated nutrition needs: 2265-2500 cal, 113-120 g protein, 2.3 L fluid.  Nutrition diagnosis: Inadequate oral intake ongoing. Severe malnutrition ongoing.  Intervention: Provided encouragement for patient to continue increased calories and protein in small frequent meals and snacks. Recommend increase Ensure Enlive to a minimum of 3 cartons daily.  Patient does not need samples at this time. Reviewed additional strategies for mucositis. Encouraged nausea medication when needed.  Monitoring, evaluation, goals: Patient will tolerate increased calories and protein to minimize weight loss.  Next visit: Wednesday, January 4 with Vinnie Level.  **Disclaimer: This note was dictated with voice recognition software. Similar sounding words can inadvertently be transcribed and this note may contain transcription errors which may not have been corrected upon publication of note.**

## 2021-01-26 ENCOUNTER — Ambulatory Visit
Admission: RE | Admit: 2021-01-26 | Discharge: 2021-01-26 | Disposition: A | Discharge status: Home / Self Care | Payer: Medicaid Other | Source: Ambulatory Visit | Attending: Radiation Oncology | Admitting: Radiation Oncology

## 2021-01-26 ENCOUNTER — Ambulatory Visit: Payer: Medicaid Other

## 2021-01-26 DIAGNOSIS — C328 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: Secondary | ICD-10-CM | POA: Diagnosis not present

## 2021-01-30 ENCOUNTER — Ambulatory Visit: Payer: Medicaid Other

## 2021-01-30 ENCOUNTER — Other Ambulatory Visit: Payer: Self-pay

## 2021-01-30 ENCOUNTER — Ambulatory Visit
Admission: RE | Admit: 2021-01-30 | Discharge: 2021-01-30 | Disposition: A | Discharge status: Home / Self Care | Payer: Medicaid Other | Source: Ambulatory Visit | Attending: Radiation Oncology | Admitting: Radiation Oncology

## 2021-01-30 ENCOUNTER — Inpatient Hospital Stay: Payer: Medicaid Other

## 2021-01-30 DIAGNOSIS — R530 Neoplastic (malignant) related fatigue: Secondary | ICD-10-CM | POA: Diagnosis present

## 2021-01-30 DIAGNOSIS — F1920 Other psychoactive substance dependence, uncomplicated: Secondary | ICD-10-CM | POA: Diagnosis present

## 2021-01-30 DIAGNOSIS — Z515 Encounter for palliative care: Secondary | ICD-10-CM | POA: Insufficient documentation

## 2021-01-30 DIAGNOSIS — R634 Abnormal weight loss: Secondary | ICD-10-CM | POA: Insufficient documentation

## 2021-01-30 DIAGNOSIS — F17210 Nicotine dependence, cigarettes, uncomplicated: Secondary | ICD-10-CM | POA: Insufficient documentation

## 2021-01-30 DIAGNOSIS — C328 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: Secondary | ICD-10-CM

## 2021-01-30 DIAGNOSIS — R070 Pain in throat: Secondary | ICD-10-CM | POA: Insufficient documentation

## 2021-01-30 DIAGNOSIS — K1231 Oral mucositis (ulcerative) due to antineoplastic therapy: Secondary | ICD-10-CM | POA: Diagnosis present

## 2021-01-30 LAB — CBC WITH DIFFERENTIAL (CANCER CENTER ONLY)
Abs Immature Granulocytes: 0.01 10*3/uL (ref 0.00–0.07)
Basophils Absolute: 0 10*3/uL (ref 0.0–0.1)
Basophils Relative: 1 %
Eosinophils Absolute: 0 10*3/uL (ref 0.0–0.5)
Eosinophils Relative: 0 %
HCT: 32.8 % — ABNORMAL LOW (ref 39.0–52.0)
Hemoglobin: 11.2 g/dL — ABNORMAL LOW (ref 13.0–17.0)
Immature Granulocytes: 0 %
Lymphocytes Relative: 5 %
Lymphs Abs: 0.4 10*3/uL — ABNORMAL LOW (ref 0.7–4.0)
MCH: 28.6 pg (ref 26.0–34.0)
MCHC: 34.1 g/dL (ref 30.0–36.0)
MCV: 83.7 fL (ref 80.0–100.0)
Monocytes Absolute: 0.6 10*3/uL (ref 0.1–1.0)
Monocytes Relative: 8 %
Neutro Abs: 5.6 10*3/uL (ref 1.7–7.7)
Neutrophils Relative %: 86 %
Platelet Count: 175 10*3/uL (ref 150–400)
RBC: 3.92 MIL/uL — ABNORMAL LOW (ref 4.22–5.81)
RDW: 16.6 % — ABNORMAL HIGH (ref 11.5–15.5)
WBC Count: 6.6 10*3/uL (ref 4.0–10.5)
nRBC: 0 % (ref 0.0–0.2)

## 2021-01-30 LAB — CMP (CANCER CENTER ONLY)
ALT: 28 U/L (ref 0–44)
AST: 40 U/L (ref 15–41)
Albumin: 3.8 g/dL (ref 3.5–5.0)
Alkaline Phosphatase: 67 U/L (ref 38–126)
Anion gap: 6 (ref 5–15)
BUN: 23 mg/dL — ABNORMAL HIGH (ref 6–20)
CO2: 32 mmol/L (ref 22–32)
Calcium: 9.2 mg/dL (ref 8.9–10.3)
Chloride: 93 mmol/L — ABNORMAL LOW (ref 98–111)
Creatinine: 0.87 mg/dL (ref 0.61–1.24)
GFR, Estimated: >60 mL/min (ref >60)
Glucose, Bld: 78 mg/dL (ref 70–99)
Potassium: 3.6 mmol/L (ref 3.5–5.1)
Sodium: 131 mmol/L — ABNORMAL LOW (ref 135–145)
Total Bilirubin: 0.6 mg/dL (ref 0.3–1.2)
Total Protein: 7.9 g/dL (ref 6.5–8.1)

## 2021-01-30 LAB — MAGNESIUM: Magnesium: 2 mg/dL (ref 1.7–2.4)

## 2021-01-31 ENCOUNTER — Inpatient Hospital Stay: Payer: Medicaid Other | Admitting: Dietician

## 2021-01-31 ENCOUNTER — Ambulatory Visit
Admission: RE | Admit: 2021-01-31 | Discharge: 2021-01-31 | Disposition: A | Discharge status: Home / Self Care | Payer: Medicaid Other | Source: Ambulatory Visit | Attending: Radiation Oncology | Admitting: Radiation Oncology

## 2021-01-31 ENCOUNTER — Ambulatory Visit: Payer: Medicaid Other

## 2021-01-31 DIAGNOSIS — C328 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: Secondary | ICD-10-CM | POA: Diagnosis not present

## 2021-01-31 NOTE — Progress Notes (Signed)
Nutrition Follow-up:  Patient receiving concurrent chemoradiation for larynx cancer. Patient has declined feeding tube.  Met with patient and his mother prior to radiation. He reports he has not increased oral intake. Patient is drinking 1-2 Ensure daily. Patient reports loss of taste. He is not eating much, food taste like card board. Patient is drinking soda. This helps "with my nerves" He is smoking cigarettes occasionally. He reports nausea lasting ~4 days after chemotherapy. He continues to have mucositis. He is not using baking soda salt water rinses regularly. Patient denies constipation, diarrhea.   Medications: reviewed   Labs: 1/3 - Na 131  Anthropometrics: Weight 108 lb on 1/3 decreased from 111 lb on 12/29  12/19 - 113.4 lb 12/5 - 118 lb  11/22 - 123 lb  2.7% decrease in one week; significant  NUTRITION DIAGNOSIS: Severe malnutrition ongoing   INTERVENTION:  Continue to stress importance of calories and protein Patient will work to increase Ensure to 5 daily One complimentary case of Ensure Enlive provided today  Reviewed strategies for mucositis  Recommend doing baking soda salt water rinses several times/day   MONITORING, EVALUATION, GOAL: weight trends, intake    NEXT VISIT: Wednesday January 11 (pt aware)

## 2021-02-01 ENCOUNTER — Ambulatory Visit: Payer: Medicaid Other

## 2021-02-01 ENCOUNTER — Other Ambulatory Visit: Payer: Self-pay

## 2021-02-01 ENCOUNTER — Ambulatory Visit
Admission: RE | Admit: 2021-02-01 | Discharge: 2021-02-01 | Disposition: A | Discharge status: Home / Self Care | Payer: Medicaid Other | Source: Ambulatory Visit | Attending: Radiation Oncology | Admitting: Radiation Oncology

## 2021-02-01 DIAGNOSIS — R1310 Dysphagia, unspecified: Secondary | ICD-10-CM | POA: Insufficient documentation

## 2021-02-01 DIAGNOSIS — R490 Dysphonia: Secondary | ICD-10-CM | POA: Insufficient documentation

## 2021-02-01 DIAGNOSIS — C328 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: Secondary | ICD-10-CM | POA: Diagnosis not present

## 2021-02-01 NOTE — Therapy (Signed)
[facility] [address] [place], [place], [address] Phone: [phone]   Fax:  [phone]  Speech Language Pathology Treatment  Patient Details  Name: David [name] MRN: [id number] Date of Birth: [birth year] Referring Provider (SLP): [name], MD   Encounter Date: 02/01/2021   End of Session - 02/01/21 1623     Visit Number 2    Number of Visits 3    Date for SLP Re-Evaluation 03/28/21    Authorization Type [place]    SLP Start Time 1532    SLP Stop Time  1607    SLP Time Calculation (min) 35 min    Activity Tolerance Patient tolerated treatment well   slight agitation after he coughed on first sip water            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[place])    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 ([place])    Pneumothorax    Left lung spontaneous pneumothorax at age 41 yr     Past Surgical History:  Procedure Laterality Date   chest tubes Left     There were no vitals filed for this visit.          ADULT SLP TREATMENT - 02/01/21 1524       Treatment Provided   Treatment provided Dysphagia      Dysphagia Treatment   Temperature Spikes Noted No    Respiratory Status Room air    Treatment Methods Skilled observation;Therapeutic exercise;Patient/caregiver education;Compensation strategy training    Patient observed directly with PO's Yes    Type of PO's observed Dysphagia 3 (soft);Thin liquids    Liquids provided via Cup    Oral Phase Signs & Symptoms Other (comment)   none   Pharyngeal Phase Signs & Symptoms Immediate cough   on 1/5 sips of water "This never happens at home," pt stated, "I don't do any coughing at home."   Other treatment/comments "[name]," said pt when SLP brought up his modified barium swallow (MBS) last month while an inpatient. Results of that assessment included:  "...moderately severe pharyngeal dysphagia mostly c/b decreased  muscular contraction, impaired laryngeal closure/tongue base retraction likely from edema/iatrogenic effects of XRT. 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  Aforementioned deficits allow secretion aspiration, retention across all consistencies and aspiration of liquids and secretions.    Pt did not aspirate solids and retention was not severe.  Nectar consumption facilitated pharyngeal clearance. To mitigate aspiration, recommend dys3/nectar diet - allowing tsps thin water after oral care, single Ice chips anytime.  Pt needs to double swallow with liquids - take SMALL boluses.  He both silently and audibly aspirates and thus following swallow precautions will be prudent for assurance of po tolerance.  Though pt states this is a new phenomenon, given his decreased awareness to overtly coughing with intake during clinical evaluation, suspect acute on chronic deficits from XRT. Diet recommendations: Dysphagia 3 (mechanical soft);Thin liquid. Medication Administration: Whole meds with puree (crush if large and not contraindicated) Supervision: Patient able to self feed. Compensations: Slow rate;Small sips/bites (multiple swallows with liquids, cough and expectorate as needed to clear secretions). Postural Changes and/or Swallow Maneuvers: Seated upright 90 degrees;Upright 30-60 min after meal." [name] told this SLP that it was easier for him to swallow now and that he normally uses lidocaine prior to eating to diminish odynophagia, and, "I had to learn to slow it down (with liquids)" Pt states that if he has  careful swallowing and small sips of fluids that no/very little couging has been exhibited since he was hospitalized in mid-December. Pt reiterated to SLP when he had initial coughing episode with water described above in chart that normally he does not have any issues if he is careful, and he was trying to be careful with that swallow. Pt raised his voice and said, "That's the first time I've done that in  like 3 weeks." SLP encouraged pt that he had 7 swallows that he did not cough, and provided rationale for him coughing - edema, and/or he did not have lidocaine so he may have modified his swallowing response as that was the first one he took during the session. Pt appeared to calm down after SLP's possible explanations for the coughing on his first sip. SLP educated pt on swallowing HEP again as pt had not performed HEP. He performed each exercise with good success (effortful, Masako, and Mendelsohn).      Assessment / Recommendations / Plan   Plan Continue with current plan of care      Dysphagia Recommendations   Diet recommendations --   as tolerated   Liquids provided via Cup    Medication Administration --   as tolerated   Compensations Small sips/bites      Progression Toward Goals   Progression toward goals Progressing toward goals              SLP Education - 02/01/21 1600     Education Details HEP procedure, late effects radiation on swallow function            Short Term Goals 02/01/21        SLP SHORT TERM GOAL #1      Title pt will complete HEP with occasional min A    Time 1    Period --   vists, for all STGs    Status ongoing           SLP SHORT TERM GOAL #2    Title pt will tell SLP why pt is completing HEP with modified independence    Time 1    Status Not met, ongoing           SLP SHORT TERM GOAL #3    Title pt will describe 3 overt s/s aspiration PNA with modified independence    Time 2    Status ongoing           SLP SHORT TERM GOAL #4    Title pt will tell SLP how a food journal could hasten return to a more normalized diet    Time 2    Status ongoing                     Long Term Goals 02/01/21                 SLP LONG TERM GOAL #1    Title pt will complete HEP with modified independence over two visits    Time 3    Period --   visits, for all LTGs    Status ongoing           SLP LONG TERM GOAL #2    Title pt will describe how  to modify HEP over time, and the timeline associated with reduction in HEP frequency with modified independence over two sessions    Time 4    Status ongoing       Plan - 02/01/21  Clinical Impression Statement At this time pt swallowing is deemed [facility] with dys III diet (cheese stick tested today), and thin liquids. SLP reviewed pt's individualized HEP for dysphagia and pt completed each exercise on their own again with consistent min-mod cues faded to modified independent. There are no overt s/s aspiration reported by pt at this time. Data indicate that pt's swallow ability will likely decrease over the course of radiation/chemotherapy and could very well decline over time following conclusion of their radiation therapy due to muscle disuse atrophy and/or muscle fibrosis. Pt will cont to need to be seen by SLP in order to assess safety of PO intake, assess the need for recommending any objective swallow assessment, and ensuring pt correctly completes the individualized HEP.    Speech Therapy Frequency --   once approx every four weeks    Duration --   90 days/2 therapy sessions for this reporting period;  Overall plan of care to include approx 7 visits    Treatment/Interventions Aspiration precaution training;Pharyngeal strengthening exercises;Diet toleration management by SLP;Trials of upgraded texture/liquids;Patient/family education;SLP instruction and feedback;Compensatory techniques    Potential to Achieve Goals Fair: SLP is concerned for decr'd participation/cooperation, and incr'd lethargy    SLP Home Exercise Plan provided    Consulted and Agree with Plan of Care Patient          Patient will benefit from skilled therapeutic intervention in order to improve the following deficits and impairments:   Dysphagia, unspecified type  Hoarseness of voice    Problem List Patient Active Problem List   Diagnosis Date Noted   Drug abuse and dependence ([place]) 01/15/2021   Weight loss,  unintentional 01/15/2021   Protein-calorie malnutrition, severe 01/10/2021   Hypomagnesemia 01/09/2021   Hypokalemia 01/09/2021   Pancytopenia ([place]) 01/09/2021   Hypotension 01/08/2021   AKI (acute kidney injury) ([place]) 01/08/2021   Hyponatremia 01/08/2021   Leukopenia due to antineoplastic chemotherapy ([place]) 01/08/2021   SIRS (systemic inflammatory response syndrome) ([place]) 01/08/2021   Chemotherapy induced nausea and vomiting 01/01/2021   Goals of care, counseling/discussion 12/13/2020   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 ([place]) 12/04/2020   Encounter for preoperative dental examination 12/04/2020   Teeth missing 12/04/2020   Caries 12/04/2020   Retained tooth root 12/04/2020   Chronic apical periodontitis 12/04/2020   Impacted third molar tooth 12/04/2020   Attrition, teeth excessive 12/04/2020   Chronic periodontitis 12/04/2020   Accretions on teeth 12/04/2020   Incipient enamel caries 12/04/2020    [place], CCC-SLP 02/01/2021, 4:24 PM  [facility] [address], [place], [place], [address] Phone: [phone]   Fax:  [phone]   Name: Tedrick Port MRN: [id number] Date of Birth: [birth year]

## 2021-02-02 ENCOUNTER — Other Ambulatory Visit: Payer: Medicaid Other | Admitting: General Practice

## 2021-02-02 ENCOUNTER — Telehealth: Payer: Self-pay | Admitting: General Practice

## 2021-02-02 ENCOUNTER — Ambulatory Visit: Payer: Medicaid Other

## 2021-02-02 ENCOUNTER — Ambulatory Visit
Admission: RE | Admit: 2021-02-02 | Discharge: 2021-02-02 | Disposition: A | Discharge status: Home / Self Care | Payer: Medicaid Other | Source: Ambulatory Visit | Attending: Radiation Oncology | Admitting: Radiation Oncology

## 2021-02-02 DIAGNOSIS — C328 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: Secondary | ICD-10-CM | POA: Diagnosis not present

## 2021-02-02 NOTE — Telephone Encounter (Signed)
[facility] CSW Progress Notes  Call to patient to explore his wishes for referral for supportive counseling, unable to leave message as VM was full.  Made appointment for him to stop at [facility] before/after radiation today so we can discuss this in person.  Edwyna Shell, LCSW Clinical Social Worker Phone:  [phone]

## 2021-02-05 ENCOUNTER — Inpatient Hospital Stay: Payer: Medicaid Other

## 2021-02-05 ENCOUNTER — Inpatient Hospital Stay: Payer: Medicaid Other | Admitting: Physician Assistant

## 2021-02-05 ENCOUNTER — Other Ambulatory Visit: Payer: Self-pay

## 2021-02-05 ENCOUNTER — Ambulatory Visit: Payer: Medicaid Other

## 2021-02-05 ENCOUNTER — Ambulatory Visit
Admission: RE | Admit: 2021-02-05 | Discharge: 2021-02-05 | Disposition: A | Discharge status: Home / Self Care | Payer: Medicaid Other | Source: Ambulatory Visit | Attending: Radiation Oncology | Admitting: Radiation Oncology

## 2021-02-05 DIAGNOSIS — C328 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: Secondary | ICD-10-CM | POA: Diagnosis not present

## 2021-02-05 LAB — CMP (CANCER CENTER ONLY)
ALT: 21 U/L (ref 0–44)
AST: 27 U/L (ref 15–41)
Albumin: 3.5 g/dL (ref 3.5–5.0)
Alkaline Phosphatase: 56 U/L (ref 38–126)
Anion gap: 6 (ref 5–15)
BUN: 9 mg/dL (ref 6–20)
CO2: 31 mmol/L (ref 22–32)
Calcium: 8.9 mg/dL (ref 8.9–10.3)
Chloride: 99 mmol/L (ref 98–111)
Creatinine: 0.67 mg/dL (ref 0.61–1.24)
GFR, Estimated: >60 mL/min (ref >60)
Glucose, Bld: 108 mg/dL — ABNORMAL HIGH (ref 70–99)
Potassium: 4.2 mmol/L (ref 3.5–5.1)
Sodium: 136 mmol/L (ref 135–145)
Total Bilirubin: 0.4 mg/dL (ref 0.3–1.2)
Total Protein: 7 g/dL (ref 6.5–8.1)

## 2021-02-05 LAB — CBC WITH DIFFERENTIAL (CANCER CENTER ONLY)
Abs Immature Granulocytes: 0.02 10*3/uL (ref 0.00–0.07)
Basophils Absolute: 0 10*3/uL (ref 0.0–0.1)
Basophils Relative: 0 %
Eosinophils Absolute: 0 10*3/uL (ref 0.0–0.5)
Eosinophils Relative: 0 %
HCT: 27.6 % — ABNORMAL LOW (ref 39.0–52.0)
Hemoglobin: 9.4 g/dL — ABNORMAL LOW (ref 13.0–17.0)
Immature Granulocytes: 0 %
Lymphocytes Relative: 3 %
Lymphs Abs: 0.2 10*3/uL — ABNORMAL LOW (ref 0.7–4.0)
MCH: 28.8 pg (ref 26.0–34.0)
MCHC: 34.1 g/dL (ref 30.0–36.0)
MCV: 84.7 fL (ref 80.0–100.0)
Monocytes Absolute: 0.4 10*3/uL (ref 0.1–1.0)
Monocytes Relative: 7 %
Neutro Abs: 5.2 10*3/uL (ref 1.7–7.7)
Neutrophils Relative %: 90 %
Platelet Count: 65 10*3/uL — ABNORMAL LOW (ref 150–400)
RBC: 3.26 MIL/uL — ABNORMAL LOW (ref 4.22–5.81)
RDW: 16.4 % — ABNORMAL HIGH (ref 11.5–15.5)
Smear Review: NORMAL
WBC Count: 5.8 10*3/uL (ref 4.0–10.5)
nRBC: 0 % (ref 0.0–0.2)

## 2021-02-05 LAB — MAGNESIUM: Magnesium: 1.8 mg/dL (ref 1.7–2.4)

## 2021-02-06 ENCOUNTER — Ambulatory Visit
Admission: RE | Admit: 2021-02-06 | Discharge: 2021-02-06 | Disposition: A | Discharge status: Home / Self Care | Payer: Medicaid Other | Source: Ambulatory Visit | Attending: Radiation Oncology | Admitting: Radiation Oncology

## 2021-02-06 ENCOUNTER — Other Ambulatory Visit: Payer: Self-pay | Admitting: Radiation Oncology

## 2021-02-06 ENCOUNTER — Ambulatory Visit: Payer: Medicaid Other

## 2021-02-06 DIAGNOSIS — C328 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: Secondary | ICD-10-CM

## 2021-02-07 ENCOUNTER — Other Ambulatory Visit: Payer: Self-pay

## 2021-02-07 ENCOUNTER — Ambulatory Visit: Payer: Medicaid Other

## 2021-02-07 ENCOUNTER — Ambulatory Visit
Admission: RE | Admit: 2021-02-07 | Discharge: 2021-02-07 | Disposition: A | Discharge status: Home / Self Care | Payer: Medicaid Other | Source: Ambulatory Visit | Attending: Radiation Oncology | Admitting: Radiation Oncology

## 2021-02-07 ENCOUNTER — Inpatient Hospital Stay: Payer: Medicaid Other | Admitting: Dietician

## 2021-02-07 DIAGNOSIS — C328 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: Secondary | ICD-10-CM | POA: Diagnosis not present

## 2021-02-07 NOTE — Progress Notes (Signed)
Nutrition Follow-up:  Patient receiving concurrent chemoradiation for larynx cancer. Patient has declined feeding tube.   Met with patient prior to radiation. He reports fatigue, would like to sleep. Patient continues to have altered taste, reports able to taste some sweets. He has been eating more, recalls [employer] McGriddles, Frappes, chicken noodle soup. He has not been drinking Ensure. Patient reports nausea this morning. He denies constipation, diarrhea.    Medications: reviewed   Labs: 1/09 - glucose 108  Anthropometrics: Weight 110.6 lb on 1/10 increased from 108 lb on 1/03  12/29 - 111 lb 12/19 - 113.4 lb 12/05 - 118 lb 11/22 - 123 lb    NUTRITION DIAGNOSIS: Severe malnutrition ongoing    INTERVENTION:  Encouraged high calorie high protein foods  Encouraged pt to consume 2-3 Ensure Plus in addition to oral intake  Continue baking soda salt water rinses several times daily Provided support and encouragement Patient has contact information     MONITORING, EVALUATION, GOAL: weight trends, intake   NEXT VISIT: Wednesday January 18 during infusion

## 2021-02-08 ENCOUNTER — Ambulatory Visit: Payer: Medicaid Other

## 2021-02-08 ENCOUNTER — Ambulatory Visit
Admission: RE | Admit: 2021-02-08 | Discharge: 2021-02-08 | Disposition: A | Discharge status: Home / Self Care | Payer: Medicaid Other | Source: Ambulatory Visit | Attending: Radiation Oncology | Admitting: Radiation Oncology

## 2021-02-08 DIAGNOSIS — C328 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: Secondary | ICD-10-CM | POA: Diagnosis not present

## 2021-02-09 ENCOUNTER — Encounter: Payer: Self-pay | Admitting: Radiation Oncology

## 2021-02-09 ENCOUNTER — Ambulatory Visit
Admission: RE | Admit: 2021-02-09 | Discharge: 2021-02-09 | Disposition: A | Discharge status: Home / Self Care | Payer: Medicaid Other | Source: Ambulatory Visit | Attending: Radiation Oncology | Admitting: Radiation Oncology

## 2021-02-09 ENCOUNTER — Other Ambulatory Visit: Payer: Self-pay

## 2021-02-09 DIAGNOSIS — C328 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: Secondary | ICD-10-CM | POA: Diagnosis not present

## 2021-02-09 NOTE — Progress Notes (Signed)
Oncology Nurse Navigator Documentation   Met with David [name] after final RT to offer support and to celebrate end of radiation treatment.   Provided verbal/written post-RT guidance: Importance of keeping all follow-up appts, especially those with Nutrition and SLP. Importance of protecting treatment area from sun. Continuation of Sonafine application 2-3 times daily, application of antibiotic ointment to areas of raw skin; when supply of Sonafine exhausted transition to OTC lotion with vitamin E. Provided/reviewed Epic calendar of upcoming appts. Explained my role as navigator will continue for several more months, encouraged him to call me with needs/concerns.    Harlow Asa RN, BSN, OCN Head & Neck Oncology Nurse [place] [facility] Phone # [phone]  Fax # [phone]

## 2021-02-12 ENCOUNTER — Inpatient Hospital Stay: Payer: Medicaid Other

## 2021-02-13 MED FILL — Fosaprepitant Dimeglumine For IV Infusion 150 MG (Base Eq): INTRAVENOUS | Qty: 5 | Status: AC

## 2021-02-13 MED FILL — Dexamethasone Sodium Phosphate Inj 100 MG/10ML: INTRAMUSCULAR | Qty: 1 | Status: AC

## 2021-02-13 NOTE — Progress Notes (Signed)
[place] NOTE  Patient Care Team: Default, Provider, MD as PCP - General [name], [name], RN as Oncology Nurse Navigator [name], [name], DO as Consulting Physician (Otolaryngology) [name], MD as Consulting Physician (Radiation Oncology) [name], MD as Consulting Physician (Hematology and Oncology)  CHIEF COMPLAINTS/PURPOSE OF CONSULTATION:  SCC of overlapping sites of larynx, here for pre chemo visit.  ASSESSMENT & PLAN:   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 [facility]) This is a 46 year old male patient with squamous cell carcinoma of the larynx currently on concurrent chemoradiation with cisplatin 100 mg per metered squared dosing started treatment with Dr. Julien Nordmann and Dr. Isidore Moos transferring care.  He received his first cycle of chemotherapy on 12/25/2020 and is here for follow-up for third cycle of cisplatin.Second cycle of chemotherapy delayed due to bacteremia and concurrent use of abx. He says he does not want to proceed with the planned cycle 3 of chemo.  He feels terribly weak today and he has been going through a lot of pain in his hoping someone can help address the pain issues with him.  In the past he was engaged with palliative care but he was using heroin and hence he could not follow-up with them.  At this time, given patient's reluctance to proceed with planned cycle 3 of chemo and since he has received 2 doses of 100 mg per metered squared, we will discontinue chemotherapy and recommend follow-up.  He should proceed with the pet imaging to evaluate response and follow-up with Dr. Isidore Moos.   Drug abuse and dependence [facility]) He denies any use of heroin since his last visit.   He continues to follow-up with methadone clinic for opioid needs.  Weight loss, unintentional He lost 5 lbs since last visit He denies G tube despite extensive counseling.   We recommended to try and increase his p.o. intake, soft foods and  encouraged hydration, he refused IV hydration today. He should continue to follow up with nutrition.  Mucositis due to antineoplastic therapy He complains of severe pain in his throat which limits him from eating or drinking.  He denied G-tube in the past.  He is currently on methadone from methadone clinic for heroin use.  He was hoping he can get some pain medication for management of mucositis.  In the past when he was engaged to see palliative care, he was concurrently used heroin hence he was fired from the pain management clinic.  We will try to reengage pain management to see if this can be taken care of.  I do not feel comfortable prescribing him pain medication at this time given his past history and his concurrent methadone use.    No orders of the defined types were placed in this encounter.  I have discussed about presence of upper paraesophageal lymph nodes today with him.  I told him that Dr. Isidore Moos hope to give him the benefit of doubt and recommended concurrent chemoradiation.  By the time he was transferred, he already received first cycle of cisplatin.  I do agree this is reasonable approach but again explained to him that he is definitely at high risk of metastatic disease.  He expressed understanding of the recommendations   HISTORY OF PRESENTING ILLNESS:   David [name] 46 y.o. male is here because of squamous cell carcinoma of the larynx.  This is a very pleasant 46 year old male patient who initially presented to the emergency room on November 20, 2020 with right-sided neck swelling for  approximately 6 weeks.  He was tried on antibiotics without resolution of symptoms.  He had CT neck performed which revealed asymmetric soft tissue thickening of the right aspect of the oropharynx extending into the right supraglottis and glottis as well as an enlarged right level 2 lymph node concerning for malignancy.  He then had laryngoscopy which revealed right pyriform sinus  completely effaced with ulceration.  Significant edema of the right supraglottis involving the aryepiglottic fold, noted arytenoid and likely false vocal cord.  Right true vocal cord movement was unable to visualized secondary to supraglottic edema. Fine-needle aspiration of the neck revealed scattered benign squamous cells in the background of blood.  No definitive evidence of dysplasia or carcinoma. Repeat biopsy done on 12/08/2020 showed keratinizing squamous cell carcinoma, p16 negative, lymph node tissue not identified PET/CT done on 12/12/2020 showed right oropharyngeal, hypopharyngeal and supraglottic mass extending down to the level of glottis with a 6.3 cm vertical exertion maximum SUV of 24.0.  Associated bilateral hypermetabolic level 2, right level 3 and right paracentral adenopathy along with the left supraclavicular and right upper thoracic paraesophageal hypermetabolic lymph node.  Faint patchy groundglass opacities in both lungs identified with low-grade activity likely inflammatory.  Staging was determined to be T3 N3 M0  He was started on concurrent chemoradiation with cisplatin 100 mg per metered square every 3 weeks with concurrent radiation under the care of Dr. Isidore Moos and Dr. Julien Nordmann.  Cycle 1 day 1 of cisplatin administered on 12/25/2020 C2D1 of cisplatin on 01/24/2021  His care was transferred to me since I see most of the head and neck primary here.    Interval History  He is here before his planned C3 chemotherapy Rest of the pertinent 10 point ROS reviewed and negative.  MEDICAL HISTORY:  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[place])    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 ([place])    Pneumothorax    Left lung spontaneous pneumothorax at age 69 yr     SURGICAL HISTORY: Past Surgical History:  Procedure Laterality Date   chest tubes Left     SOCIAL HISTORY: Social History   Socioeconomic History   Marital status: Divorced     Spouse name: Not on file   Number of children: Not on file   Years of education: Not on file   Highest education level: Not on file  Occupational History   Not on file  Tobacco Use   Smoking status: Every Day    Packs/day: 3.00    Types: Cigarettes   Smokeless tobacco: Never  Vaping Use   Vaping Use: Never used  Substance and Sexual Activity   Alcohol use: Not Currently    Alcohol/week: 12.0 standard drinks    Types: 12 Cans of beer per week   Drug use: Not Currently    Types: IV    Comment: 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  ALLERGIES:  has No Known Allergies.  MEDICATIONS:  Current Outpatient  Medications  Medication Sig Dispense Refill   clotrimazole (MYCELEX) 10 MG troche Take 1 tablet (10 mg total) by mouth 5 (five) times daily. Suck on these slowly. 70 Troche 0   dexamethasone (DECADRON) 4 MG tablet Take 1 tablet PO BID; take with food. (Patient not taking: Reported on 01/08/2021) 40 tablet 0   dronabinol (MARINOL) 2.5 MG capsule Take 2.5 mg by mouth 2 (two) times daily before a meal.     fluticasone (FLONASE) 50 MCG/ACT nasal spray Place 2 sprays into both nostrils daily. (Patient not taking: Reported on 01/08/2021) 18 mL 2   ibuprofen (ADVIL,MOTRIN) 200 MG tablet Take 600 mg by mouth every 6 (six) hours as needed for moderate pain.     lidocaine (XYLOCAINE) 2 % solution Patient: Mix 1part 2% viscous lidocaine, 1part H20. Swish/swallow 22mL of diluted mixture, 24min before meals and @bedtime , up to QID prn soreness 200 mL 2    methadone (DOLOPHINE) 10 MG/ML solution Take 6 mLs (60 mg total) by mouth daily. (Patient taking differently: Take 20 mg by mouth every 8 (eight) hours as needed for severe pain.) 90 mL 0   nystatin (MYCOSTATIN) 100000 UNIT/ML suspension Take 5 mLs by mouth 4 (four) times daily. Swish & Swallow     omeprazole (PRILOSEC) 20 MG capsule Take 1 capsule (20 mg total) by mouth daily. (Patient not taking: Reported on 01/08/2021) 30 capsule 0   ondansetron (ZOFRAN) 8 MG tablet Take 1 tablet (8 mg total) by mouth every 8 (eight) hours as needed for nausea or vomiting. Starting day 3 after chemotherapy (Patient not taking: Reported on 01/08/2021) 30 tablet 2   prochlorperazine (COMPAZINE) 10 MG tablet Take 1 tablet (10 mg total) by mouth every 6 (six) hours as needed. (Patient taking differently: Take 10 mg by mouth every 6 (six) hours as needed for nausea or vomiting.) 30 tablet 2   sucralfate (CARAFATE) 1 g tablet Take 1 tablet (1 g total) by mouth 4 (four) times daily -  with meals and at bedtime. Crush and dissolve in 10 mL of warm water prior to swallowing, take 20 minutes before meals 120 tablet 1   No current facility-administered medications for this visit.     PHYSICAL EXAMINATION: ECOG PERFORMANCE STATUS: 1 - Symptomatic but completely ambulatory  Vitals:   02/14/21 0805  BP: 116/74  Pulse: 94  Resp: 18  Temp: 97.7 F (36.5 C)  SpO2: 99%     Vital signs reviewed and stable, blood pressure 120/96, pulse 92, temperature of 98, oxygen 99%, weight of 113.7  GENERAL:alert, no distress and comfortable, appears cachectic and tired. SKIN: Radiation-induced skin changes on the neck, no evidence of infection EYES: normal, conjunctiva are pink and non-injected, sclera clear OROPHARYNX: Mucositis noted, moderate NECK: supple, thyroid normal size, non-tender, without nodularity LYMPH: Palpable lymphadenopathy in cervical region, less prominent compared to last visit LUNGS: clear to auscultation  and percussion with normal breathing effort HEART: regular rate & rhythm and no murmurs and no lower extremity edema ABDOMEN:abdomen soft, non-tender and normal bowel sounds Musculoskeletal:no cyanosis of digits and no clubbing  PSYCH: alert & oriented x 3 with fluent speech NEURO: no focal motor/sensory deficits  LABORATORY DATA:  I have reviewed the data as listed Lab Results  Component Value Date   WBC 3.1 (L) 02/14/2021   HGB 10.6 (L) 02/14/2021   HCT 31.6 (L) 02/14/2021   MCV 89.0 02/14/2021   PLT 207 02/14/2021     Chemistry      Component  Value Date/Time   NA 134 (L) 02/14/2021 0750   K 4.1 02/14/2021 0750   CL 97 (L) 02/14/2021 0750   CO2 29 02/14/2021 0750   BUN 15 02/14/2021 0750   CREATININE 0.78 02/14/2021 0750      Component Value Date/Time   CALCIUM 9.5 02/14/2021 0750   ALKPHOS 56 02/14/2021 0750   AST 23 02/14/2021 0750   ALT 16 02/14/2021 0750   BILITOT 0.5 02/14/2021 0750       RADIOGRAPHIC STUDIES: I have personally reviewed the radiological images as listed and agreed with the findings in the report. No results found.  All questions were answered. The patient knows to call the clinic with any problems, questions or concerns. I spent 30 minutes in the care of this patient including H and P, review of records, counseling and coordination of care.     [name], MD 02/14/2021 10:10 AM

## 2021-02-14 ENCOUNTER — Inpatient Hospital Stay: Payer: Medicaid Other

## 2021-02-14 ENCOUNTER — Other Ambulatory Visit: Payer: Self-pay

## 2021-02-14 ENCOUNTER — Telehealth: Payer: Self-pay | Admitting: Dietician

## 2021-02-14 ENCOUNTER — Inpatient Hospital Stay (HOSPITAL_BASED_OUTPATIENT_CLINIC_OR_DEPARTMENT_OTHER): Payer: Medicaid Other | Admitting: Hematology and Oncology

## 2021-02-14 ENCOUNTER — Inpatient Hospital Stay: Payer: Medicaid Other | Admitting: Dietician

## 2021-02-14 ENCOUNTER — Encounter: Payer: Self-pay | Admitting: Hematology and Oncology

## 2021-02-14 DIAGNOSIS — F1920 Other psychoactive substance dependence, uncomplicated: Secondary | ICD-10-CM | POA: Diagnosis not present

## 2021-02-14 DIAGNOSIS — K1231 Oral mucositis (ulcerative) due to antineoplastic therapy: Secondary | ICD-10-CM | POA: Insufficient documentation

## 2021-02-14 DIAGNOSIS — C328 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: Secondary | ICD-10-CM | POA: Diagnosis not present

## 2021-02-14 DIAGNOSIS — R634 Abnormal weight loss: Secondary | ICD-10-CM | POA: Diagnosis not present

## 2021-02-14 LAB — CBC WITH DIFFERENTIAL (CANCER CENTER ONLY)
Abs Immature Granulocytes: 0.01 10*3/uL (ref 0.00–0.07)
Basophils Absolute: 0 10*3/uL (ref 0.0–0.1)
Basophils Relative: 1 %
Eosinophils Absolute: 0 10*3/uL (ref 0.0–0.5)
Eosinophils Relative: 1 %
HCT: 31.6 % — ABNORMAL LOW (ref 39.0–52.0)
Hemoglobin: 10.6 g/dL — ABNORMAL LOW (ref 13.0–17.0)
Immature Granulocytes: 0 %
Lymphocytes Relative: 6 %
Lymphs Abs: 0.2 10*3/uL — ABNORMAL LOW (ref 0.7–4.0)
MCH: 29.9 pg (ref 26.0–34.0)
MCHC: 33.5 g/dL (ref 30.0–36.0)
MCV: 89 fL (ref 80.0–100.0)
Monocytes Absolute: 0.7 10*3/uL (ref 0.1–1.0)
Monocytes Relative: 23 %
Neutro Abs: 2.1 10*3/uL (ref 1.7–7.7)
Neutrophils Relative %: 69 %
Platelet Count: 207 10*3/uL (ref 150–400)
RBC: 3.55 MIL/uL — ABNORMAL LOW (ref 4.22–5.81)
RDW: 20.6 % — ABNORMAL HIGH (ref 11.5–15.5)
WBC Count: 3.1 10*3/uL — ABNORMAL LOW (ref 4.0–10.5)
nRBC: 0 % (ref 0.0–0.2)

## 2021-02-14 LAB — CMP (CANCER CENTER ONLY)
ALT: 16 U/L (ref 0–44)
AST: 23 U/L (ref 15–41)
Albumin: 3.9 g/dL (ref 3.5–5.0)
Alkaline Phosphatase: 56 U/L (ref 38–126)
Anion gap: 8 (ref 5–15)
BUN: 15 mg/dL (ref 6–20)
CO2: 29 mmol/L (ref 22–32)
Calcium: 9.5 mg/dL (ref 8.9–10.3)
Chloride: 97 mmol/L — ABNORMAL LOW (ref 98–111)
Creatinine: 0.78 mg/dL (ref 0.61–1.24)
GFR, Estimated: >60 mL/min (ref >60)
Glucose, Bld: 104 mg/dL — ABNORMAL HIGH (ref 70–99)
Potassium: 4.1 mmol/L (ref 3.5–5.1)
Sodium: 134 mmol/L — ABNORMAL LOW (ref 135–145)
Total Bilirubin: 0.5 mg/dL (ref 0.3–1.2)
Total Protein: 7.8 g/dL (ref 6.5–8.1)

## 2021-02-14 LAB — MAGNESIUM: Magnesium: 2 mg/dL (ref 1.7–2.4)

## 2021-02-14 NOTE — Assessment & Plan Note (Addendum)
This is a 46 year old male patient with squamous cell carcinoma of the larynx currently on concurrent chemoradiation with cisplatin 100 mg per metered squared dosing started treatment with Dr. Julien Nordmann and Dr. Isidore Moos transferring care.  He received his first cycle of chemotherapy on 12/25/2020 and is here for follow-up for third cycle of cisplatin.Second cycle of chemotherapy delayed due to bacteremia and concurrent use of abx. He says he does not want to proceed with the planned cycle 3 of chemo.  He feels terribly weak today and he has been going through a lot of pain in his hoping someone can help address the pain issues with him.  In the past he was engaged with palliative care but he was using heroin and hence he could not follow-up with them.  At this time, given patient's reluctance to proceed with planned cycle 3 of chemo and since he has received 2 doses of 100 mg per metered squared, we will discontinue chemotherapy and recommend follow-up.  He should proceed with the pet imaging to evaluate response and follow-up with Dr. Isidore Moos.

## 2021-02-14 NOTE — Telephone Encounter (Signed)
Patient scheduled to be seen during infusion for nutrition follow-up. Patient noted to have weakness and pain at medical oncology office visit and did not receive treatment. Attempted to contact patient via telephone this afternoon, however he did not answer and voice mailbox is full. Will continue trying to reach patient.

## 2021-02-14 NOTE — Assessment & Plan Note (Addendum)
He lost 5 lbs since last visit He denies G tube despite extensive counseling.   We recommended to try and increase his p.o. intake, soft foods and encouraged hydration, he refused IV hydration today. He should continue to follow up with nutrition.

## 2021-02-14 NOTE — Assessment & Plan Note (Signed)
He complains of severe pain in his throat which limits him from eating or drinking.  He denied G-tube in the past.  He is currently on methadone from methadone clinic for heroin use.  He was hoping he can get some pain medication for management of mucositis.  In the past when he was engaged to see palliative care, he was concurrently used heroin hence he was fired from the pain management clinic.  We will try to reengage pain management to see if this can be taken care of.  I do not feel comfortable prescribing him pain medication at this time given his past history and his concurrent methadone use.

## 2021-02-14 NOTE — Assessment & Plan Note (Addendum)
He denies any use of heroin since his last visit.   He continues to follow-up with methadone clinic for opioid needs.

## 2021-02-15 ENCOUNTER — Telehealth (HOSPITAL_COMMUNITY): Payer: Self-pay | Admitting: Dietician

## 2021-02-15 ENCOUNTER — Encounter (HOSPITAL_COMMUNITY): Payer: Medicaid Other | Admitting: Dietician

## 2021-02-15 ENCOUNTER — Other Ambulatory Visit: Payer: Self-pay | Admitting: Radiation Oncology

## 2021-02-15 DIAGNOSIS — C328 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: Secondary | ICD-10-CM

## 2021-02-15 MED ORDER — SUCRALFATE 1 G PO TABS: 1.0000 g | ORAL_TABLET | Freq: Three times a day (TID) | ORAL | 1 refills | Status: DC

## 2021-02-15 NOTE — Telephone Encounter (Signed)
Nutrition Follow-up:  Patient has completed treatment for larynx cancer. He underwent concurrent chemoradiation with weekly cisplatin, s/p 2 cycles on 12/28. Final radiation on 1/13. Patient has refused feeding tube.   Call placed to mother of patient after unsuccessful attempt x2 to reach patient for nutrition follow-up. Per mother, patient complains of hurting all over. He declined IV hydration on 1/18. Mother reports he is eating some. He is unable to taste anything. Patient drinking 1-2 Ensure and ~3 coke's daily. Patient hardly ever drinks water. Mother reports patient gets frustrated anytime she pushes nutrition. She continues trying.   Medications: methadone, lidocaine solution, carafate  Labs: 1/18 - Na 134, glucose 104  Anthropometrics: Last weight 106 lb 4.8 oz on 1/18  1/10 - 110.6 lb  1/03 - 108 lb  12/29 - 111 lb 12/19 - 113.4 lb 12/05 - 118 lb 11/22 - 123 lb  3.9% wt decrease in 8 days; significant   NUTRITION DIAGNOSIS: Severe malnutrition ongoing    INTERVENTION:  Encouraged bites/sips of high calorie high protein foods, suggested having patient set "meal/snack time alarms" on his phone for q2 hours  Encouraged 5 Ensure Plus/equivalent daily  Recommend IV fluids, mother to discuss with patient when she returns home Contact information provided     MONITORING, EVALUATION, GOAL: weight trends, intake   NEXT VISIT: Will plan to call patient/mother Monday 1/23

## 2021-02-16 ENCOUNTER — Telehealth: Payer: Self-pay | Admitting: Nurse Practitioner

## 2021-02-16 NOTE — Telephone Encounter (Signed)
Attempted to call pt again, voicemail still full, will try again Monday 1/23

## 2021-02-19 ENCOUNTER — Inpatient Hospital Stay: Payer: Medicaid Other

## 2021-02-19 ENCOUNTER — Telehealth (HOSPITAL_COMMUNITY): Payer: Self-pay | Admitting: Dietician

## 2021-02-19 ENCOUNTER — Encounter (HOSPITAL_COMMUNITY): Payer: Medicaid Other | Admitting: Dietician

## 2021-02-19 NOTE — Telephone Encounter (Signed)
Nutrition Follow-up:  Patient has completed treatment for larynx cancer. He underwent concurrent chemoradiation. S/p 2 cycles of cisplatin on 12/28, final radiation on 1/13.   Spoke with patient via telephone. He reports appetite is improving and eating better. Patient reports eating "all kinds of stuff" over the weekend. He is unable to provide recall. Patient ate [place] [employer] sausage biscuit for breakfast. He is currently warming a [place] steak dinner. Patient is not drinking Ensure, reports a little bit of water, but mostly sodas. Patient reports soda burns with swallowing.    Medications: review   Labs: no new labs for review   Anthropometrics: No new weights for review. Last weight 106 lb 4.8 oz on 1/18   1/10 - 110.6 lb  1/03 - 108 lb  12/29 - 111 lb 12/19 - 113.4 lb 12/05 - 118 lb 11/22 - 123 lb   NUTRITION DIAGNOSIS: Severe malnutrition likely ongoing    INTERVENTION:  Encouraged soft moist high protein foods Encouraged pt to drink 1-2 Ensure Plus daily  Patient has contact information     MONITORING, EVALUATION, GOAL: weight trends, intake    NEXT VISIT: Monday January 30 with Pamala Hurry

## 2021-02-21 ENCOUNTER — Telehealth: Payer: Self-pay

## 2021-02-21 ENCOUNTER — Inpatient Hospital Stay: Payer: Medicaid Other | Admitting: Nurse Practitioner

## 2021-02-21 NOTE — Telephone Encounter (Signed)
[name] had an appt with our office today at 12pm but had follow up appts here on 1/30. I reached out to Campbell, Gordon mother, to see if they would want to come to our office on Monday along with their other appts. She agreed to this. I rescheduled their visit for 1/30 at 12pm. All question answered. Advised to call our office with any questions/concerns.

## 2021-02-26 ENCOUNTER — Inpatient Hospital Stay: Payer: Medicaid Other | Admitting: Nutrition

## 2021-02-26 ENCOUNTER — Encounter: Payer: Self-pay | Admitting: Nurse Practitioner

## 2021-02-26 ENCOUNTER — Inpatient Hospital Stay: Payer: Medicaid Other

## 2021-02-26 ENCOUNTER — Other Ambulatory Visit: Payer: Self-pay

## 2021-02-26 ENCOUNTER — Ambulatory Visit: Payer: Medicaid Other

## 2021-02-26 ENCOUNTER — Inpatient Hospital Stay (HOSPITAL_BASED_OUTPATIENT_CLINIC_OR_DEPARTMENT_OTHER): Payer: Medicaid Other | Admitting: Nurse Practitioner

## 2021-02-26 ENCOUNTER — Other Ambulatory Visit: Payer: Medicaid Other

## 2021-02-26 ENCOUNTER — Ambulatory Visit: Payer: Medicaid Other | Admitting: Internal Medicine

## 2021-02-26 ENCOUNTER — Ambulatory Visit
Admission: RE | Admit: 2021-02-26 | Discharge: 2021-02-26 | Disposition: A | Discharge status: Home / Self Care | Payer: Medicaid Other | Source: Ambulatory Visit | Attending: Radiation Oncology | Admitting: Radiation Oncology

## 2021-02-26 VITALS — BP 98/73 | HR 68 | Temp 97.6°F | Resp 18 | Wt 110.3 lb

## 2021-02-26 DIAGNOSIS — C328 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: Secondary | ICD-10-CM | POA: Diagnosis not present

## 2021-02-26 DIAGNOSIS — Z515 Encounter for palliative care: Secondary | ICD-10-CM | POA: Diagnosis not present

## 2021-02-26 DIAGNOSIS — R530 Neoplastic (malignant) related fatigue: Secondary | ICD-10-CM | POA: Diagnosis not present

## 2021-02-26 DIAGNOSIS — R070 Pain in throat: Secondary | ICD-10-CM

## 2021-02-26 DIAGNOSIS — R634 Abnormal weight loss: Secondary | ICD-10-CM | POA: Diagnosis not present

## 2021-02-26 DIAGNOSIS — K1231 Oral mucositis (ulcerative) due to antineoplastic therapy: Secondary | ICD-10-CM

## 2021-02-26 MED ORDER — GUAIFENESIN ER 600 MG PO TB12: 1200.0000 mg | ORAL_TABLET | Freq: Two times a day (BID) | ORAL | 1 refills | Status: DC

## 2021-02-26 MED ORDER — CLOTRIMAZOLE 10 MG MT TROC: 10.0000 mg | Freq: Every day | OROMUCOSAL | 0 refills | Status: DC

## 2021-02-26 MED ORDER — NICOTINE 14 MG/24HR TD PT24: 14.0000 mg | MEDICATED_PATCH | Freq: Every day | TRANSDERMAL | 0 refills | Status: DC

## 2021-02-26 MED ORDER — BENZONATATE 100 MG PO CAPS: 100.0000 mg | ORAL_CAPSULE | Freq: Three times a day (TID) | ORAL | 0 refills | Status: DC | PRN

## 2021-02-26 MED ORDER — NICOTINE 21 MG/24HR TD PT24: 21.0000 mg | MEDICATED_PATCH | Freq: Every day | TRANSDERMAL | 2 refills | Status: DC

## 2021-02-26 MED ORDER — NICOTINE 7 MG/24HR TD PT24: 7.0000 mg | MEDICATED_PATCH | Freq: Every day | TRANSDERMAL | 0 refills | Status: DC

## 2021-02-26 MED ORDER — TRAMADOL HCL 50 MG PO TABS: 50.0000 mg | ORAL_TABLET | Freq: Two times a day (BID) | ORAL | 0 refills | Status: DC

## 2021-02-26 MED ORDER — LIDOCAINE VISCOUS HCL 2 % MT SOLN: OROMUCOSAL | 2 refills | Status: DC

## 2021-02-26 MED ORDER — SONAFINE EX EMUL
1.0000 "application " | Freq: Two times a day (BID) | CUTANEOUS | Status: DC
  Administered 2021-02-26: 1 via TOPICAL

## 2021-02-26 NOTE — Progress Notes (Signed)
[place]  [phone] [phone]) [phone]   Name: David [name] Date: 02/26/2021 MRN: [id number]  DOB: 1975-08-16  Patient Care Team: Default, Provider, MD as PCP - General [name], [name], RN as Oncology Nurse Navigator [name], [name], DO as Consulting Physician (Otolaryngology) [name], MD as Consulting Physician (Radiation Oncology) [name], MD as Consulting Physician (Hematology and Oncology)    REASON FOR CONSULTATION: David [name] is a 46 y.o. 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 We were initially seeing him for pain however due to breach in contract he was dismissed from or clinic. Patient admitted and also observed while hospitalized using heroin. He was then referred back to [facility] for management of his methadone. Palliative ask to see for symptom management given severe mucositis and pain. I have discussed visit with patient and mother and they are clear all prescribed medications will be with caution and limited given previous interaction.    SOCIAL HISTORY:     reports that he has been smoking cigarettes. He has been smoking an average of 3 packs per day. He has never used smokeless tobacco. He reports that he does not currently use alcohol after a past usage of about 12.0 standard drinks per week. He reports that he does not currently use drugs after having used the following drugs: IV.  ADVANCE DIRECTIVES:  Patient reports he does not have advanced directives.  Would like to further discuss in the future.  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  MEDICATIONS:  Current Outpatient Medications  Medication Sig Dispense Refill   benzonatate (TESSALON) 100 MG capsule Take 1 capsule (100 mg total) by mouth 3 (three) times daily as needed for cough. 60 capsule 0   guaiFENesin (MUCINEX) 600 MG 12 hr tablet Take 2 tablets (1,200 mg total) by mouth 2 (two) times daily. 60 tablet 1   traMADol (ULTRAM) 50 MG tablet Take 1 tablet (50 mg total) by mouth 2 (two) times daily. 60 tablet 0   clotrimazole (MYCELEX) 10 MG troche Take 1 tablet (10 mg total) by mouth 5 (five) times daily. Suck on these slowly. 70 Troche 0   dexamethasone (DECADRON) 4 MG tablet Take 1 tablet PO BID; take with food. (Patient not taking: Reported on 01/08/2021) 40 tablet 0   dronabinol (MARINOL) 2.5 MG capsule Take 2.5 mg by mouth 2 (two) times daily before a meal.     fluticasone (FLONASE) 50 MCG/ACT nasal spray Place 2 sprays into both nostrils daily. (Patient not taking: Reported on 01/08/2021) 18 mL 2   ibuprofen (ADVIL,MOTRIN) 200 MG tablet Take 600 mg by mouth every 6 (six) hours as needed for moderate pain.     lidocaine (XYLOCAINE) 2 % solution Patient: Mix 1part 2% viscous lidocaine, 1part H20. Swish/swallow 71mL of diluted mixture, 32min before meals and @bedtime , up to QID prn soreness 200 mL 2   methadone (DOLOPHINE) 10 MG/ML solution  Take 6 mLs (60 mg total) by mouth daily. (Patient taking differently: Take 20 mg by mouth every 8 (eight) hours as needed for severe pain.) 90 mL 0   nicotine (NICODERM CQ - DOSED IN MG/24 HOURS) 14 mg/24hr patch Place 1 patch (14 mg total) onto the skin daily. Apply 21 mg patch daily x 6 wk, then 14mg  patch daily x 2 wk, then 7 mg patch daily x 2 wk 14 patch 0   nicotine (NICODERM CQ - DOSED IN MG/24 HOURS) 21  mg/24hr patch Place 1 patch (21 mg total) onto the skin daily. Apply 21 mg patch daily x 6 wk, then 14mg  patch daily x 2 wk, then 7 mg patch daily x 2 wk 14 patch 2   nicotine (NICODERM CQ - DOSED IN MG/24 HR) 7 mg/24hr patch Place 1 patch (7 mg total) onto the skin daily. Apply 21 mg patch daily x 6 wk, then 14mg  patch daily x 2 wk, then 7 mg patch daily x 2 wk 14 patch 0   omeprazole (PRILOSEC) 20 MG capsule Take 1 capsule (20 mg total) by mouth daily. (Patient not taking: Reported on 01/08/2021) 30 capsule 0   ondansetron (ZOFRAN) 8 MG tablet Take 1 tablet (8 mg total) by mouth every 8 (eight) hours as needed for nausea or vomiting. Starting day 3 after chemotherapy (Patient not taking: Reported on 01/08/2021) 30 tablet 2   prochlorperazine (COMPAZINE) 10 MG tablet Take 1 tablet (10 mg total) by mouth every 6 (six) hours as needed. (Patient taking differently: Take 10 mg by mouth every 6 (six) hours as needed for nausea or vomiting.) 30 tablet 2   sucralfate (CARAFATE) 1 g tablet Take 1 tablet (1 g total) by mouth 4 (four) times daily -  with meals and at bedtime. Crush and dissolve in 10 mL of warm water prior to swallowing, take 20 minutes before meals 120 tablet 1   No current facility-administered medications for this visit.   Facility-Administered Medications Ordered in Other Visits  Medication Dose Route Frequency Provider Last Rate Last Admin   Sonafine emulsion 1 application  1 application Topical BID [name], MD   1 application at 16/10/96 1333    VITAL SIGNS: BP 98/73 (BP Location: Left Arm, Patient Position: Sitting)    Pulse 68    Temp 97.6 F (36.4 C) (Oral)    Resp 18    Wt 110 lb 5 oz (50 kg)    SpO2 99%    BMI 15.39 kg/m  Filed Weights   02/26/21 1211  Weight: 110 lb 5 oz (50 kg)    Estimated body mass index is 15.39 kg/m as calculated from the following:   Height as of 02/14/21: 5\' 11"  (1.803 m).   Weight as of this encounter: 110 lb 5 oz (50 kg). RADIOGRAPHIC  STUDIES: No results found.  PERFORMANCE STATUS (ECOG) : 1 - Symptomatic but completely ambulatory   Physical Exam General: NAD, thin Cardiovascular: regular rate and rhythm Pulmonary: clear ant fields, normal breathing pattern, right-sided neck mass decreased  HEENT: no observed mouth ulcerations or redness  Abdomen: soft, nontender, + bowel sounds Extremities: no edema, no joint deformities Neurological: AAOx3, mood appropriate  IMPRESSION:  Khris presents to the clinic today with concerns of ongoing pain due to severe mucositis which is also causing decreased oral intake. No acute distress noted. He is ambulatory and in a pleasant mood. Apologetic regarding previous behaviors and reports he wants to live and knows what he  has to do.   His appetite has significantly improved. No observed ulcerations at this time or redness. Xander and his mother shares he is able to eat pretty much back at baseline but still with some discomfort when swallowing again expressing this is improved compared to several weeks prior.   He also complains of ongoing cough and mucous production. Education provided on use of room humidifier and mucinex. He reports no difficulty swallowing pills. Mother verbalized understanding. He reports cough is worst in the morning and at night. Same for mucous production.   Education provided on tramadol for pain and tessalon perles to assist with coughing. Other understands plans for our continued relationship and that we are available for support as long as he agrees to sustain for illicit drug use. He verbalized understanding and appreciation of "giving him another chance!"   PLAN:  Mucinex 100 mg BID  Tessalon Perles 100 mg BID as needed for cough  Tramadol 50 mg BID as needed for pain Continue with care at [facility] for methadone  Education provided on humidifier use and importance of good oral care.  I will plan to see patient back in the clinic in  2-3 weeks.   Patient and mother expressed understanding and was in agreement with this plan. He also understands that He can call the clinic at any time with any questions, concerns, or complaints.    Time Total: 40 min  Visit consisted of counseling and education dealing with the complex and emotionally intense issues of symptom management and palliative care in the setting of serious and potentially life-threatening illness.Greater than 50%  of this time was spent counseling and coordinating care related to the above assessment and plan.  Signed by: Alda Lea, AGPCNP-BC [place]

## 2021-02-26 NOTE — Progress Notes (Signed)
Mr. Kats presents today for follow-up after completing radiation to his larynx on 02/09/2021  Pain issues, if any: Continues to struggle with throat and inner pain Using a feeding tube?: N/A Weight changes, if any:  Wt Readings from Last 3 Encounters:  02/26/21 110 lb 5 oz (50 kg)  02/14/21 106 lb 4.8 oz (48.2 kg)  01/24/21 111 lb 12 oz (50.7 kg)   Swallowing issues, if any: Slowly eating/drinking more, but both solids and liquids are uncomfortable Smoking or chewing tobacco? Continues to smoke periodically Using fluoride trays daily? N/A Last ENT visit was on: Not since diagnosis Other notable issues, if any: Had F/U with medical oncologist (Dr. Chryl Heck) on 02/14/21. Reports hick saliva worse at night while he's sleeping. Eating marijuana to try and stimulate his appetite. Skin appears intact and well healed (provided more sonafine).

## 2021-02-26 NOTE — Progress Notes (Signed)
Nutrition follow-up completed with patient after MD appointment. Patient completed radiation therapy on January 13 and chemotherapy on December 28 for [place] cancer.  Weight has improved and was documented as 110 pounds January 30. Noted labs: Sodium 134 and glucose 104 on the January 18th. Patient reports he is able to eat anything he wants.  He gives example of ice cream sandwiches, pasta, soup, cereal and milk, grilled chicken salads, and soda.  He is not drinking much water.  He occasionally drinks an Ensure plus.  Nutrition diagnosis: Severe malnutrition ongoing.  Intervention: Encouraged continued increase of calories and protein in small frequent meals and snacks. Oral nutrition supplements as tolerated.  Monitoring, evaluation, goals:  Patient will tolerate increased calories and protein for continued weight maintenance/gain and healing.  Next visit: Will contact patient in 4-6 weeks.  **Disclaimer: This note was dictated with voice recognition software. Similar sounding words can inadvertently be transcribed and this note may contain transcription errors which may not have been corrected upon publication of note.**

## 2021-02-27 ENCOUNTER — Other Ambulatory Visit: Payer: Self-pay

## 2021-02-27 ENCOUNTER — Encounter: Payer: Self-pay | Admitting: Radiation Oncology

## 2021-02-27 DIAGNOSIS — C328 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: Secondary | ICD-10-CM

## 2021-02-27 NOTE — Progress Notes (Signed)
Radiation Oncology         [phone] ________________________________  Name: David [name] MRN: [id number]  Date: 02/26/2021  DOB: 1975/11/01  Follow-Up Visit Note  CC: Default, Provider, MD  [name], [name], DO  Diagnosis and Prior Radiotherapy:       ICD-10-CM   1. 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)  C32.8 clotrimazole (MYCELEX) 10 MG troche    lidocaine (XYLOCAINE) 2 % solution    nicotine (NICODERM CQ - DOSED IN MG/24 HOURS) 21 mg/24hr patch    nicotine (NICODERM CQ - DOSED IN MG/24 HOURS) 14 mg/24hr patch    nicotine (NICODERM CQ - DOSED IN MG/24 HR) 7 mg/24hr patch    DISCONTINUED: Sonafine emulsion 1 application     Cancer Staging  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 [facility]) Staging form: Larynx - Supraglottis, AJCC 8th Edition - Clinical stage from 12/01/2020: Stage IVB (cT3, cN3b, cM0) - Signed by David Gibson, MD on 12/20/2020 Stage prefix: Initial diagnosis    CHIEF COMPLAINT:  Here for follow-up and surveillance of laryngeal cancer  Narrative:  The patient returns today for routine follow-up.  David [name] presents today for follow-up after completing radiation to his larynx on 02/09/2021  Pain issues, if any: Continues to struggle with throat and inner pain Using [name] feeding tube?: N/[name] Weight changes, if any:  Wt Readings from Last 3 Encounters:  02/26/21 110 lb 5 oz (50 kg)  02/14/21 106 lb 4.8 oz (48.2 kg)  01/24/21 111 lb 12 oz (50.7 kg)   Swallowing issues, if any: Slowly eating/drinking more, but both solids and liquids are uncomfortable Smoking or chewing tobacco? Continues to smoke periodically Using fluoride trays daily? N/[name] Last ENT visit was on: Not since diagnosis Other notable issues, if any: Had F/U with medical oncologist (Dr. Chryl [name]) on 02/14/21. Reports thick saliva worse at night while he's sleeping. Eating marijuana to try and stimulate his appetite. Skin appears intact and well healed (provided more  sonafine).                       ALLERGIES:  has No Known Allergies.  Meds: Current Outpatient Medications  Medication Sig Dispense Refill   nicotine (NICODERM CQ - DOSED IN MG/24 HOURS) 14 mg/24hr patch Place 1 patch (14 mg total) onto the skin daily. Apply 21 mg patch daily x 6 wk, then 14mg  patch daily x 2 wk, then 7 mg patch daily x 2 wk 14 patch 0   nicotine (NICODERM CQ - DOSED IN MG/24 HOURS) 21 mg/24hr patch Place 1 patch (21 mg total) onto the skin daily. Apply 21 mg patch daily x 6 wk, then 14mg  patch daily x 2 wk, then 7 mg patch daily x 2 wk 14 patch 2   nicotine (NICODERM CQ - DOSED IN MG/24 HR) 7 mg/24hr patch Place 1 patch (7 mg total) onto the skin daily. Apply 21 mg patch daily x 6 wk, then 14mg  patch daily x 2 wk, then 7 mg patch daily x 2 wk 14 patch 0   benzonatate (TESSALON) 100 MG capsule Take 1 capsule (100 mg total) by mouth 3 (three) times daily as needed for cough. 60 capsule 0   clotrimazole (MYCELEX) 10 MG troche Take 1 tablet (10 mg total) by mouth 5 (five) times daily. Suck on these slowly. 70 Troche 0   dexamethasone (DECADRON) 4 MG tablet Take 1 tablet PO BID; take with food. (Patient not taking: Reported on 01/08/2021)  40 tablet 0   dronabinol (MARINOL) 2.5 MG capsule Take 2.5 mg by mouth 2 (two) times daily before [name] meal.     fluticasone (FLONASE) 50 MCG/ACT nasal spray Place 2 sprays into both nostrils daily. (Patient not taking: Reported on 01/08/2021) 18 mL 2   guaiFENesin (MUCINEX) 600 MG 12 hr tablet Take 2 tablets (1,200 mg total) by mouth 2 (two) times daily. 60 tablet 1   ibuprofen (ADVIL,MOTRIN) 200 MG tablet Take 600 mg by mouth every 6 (six) hours as needed for moderate pain.     lidocaine (XYLOCAINE) 2 % solution Patient: Mix 1part 2% viscous lidocaine, 1part H20. Swish/swallow 70mL of diluted mixture, 67min before meals and @bedtime , up to QID prn soreness 200 mL 2   methadone (DOLOPHINE) 10 MG/ML solution Take 6 mLs (60 mg total) by mouth daily.  (Patient taking differently: Take 20 mg by mouth every 8 (eight) hours as needed for severe pain.) 90 mL 0   omeprazole (PRILOSEC) 20 MG capsule Take 1 capsule (20 mg total) by mouth daily. (Patient not taking: Reported on 01/08/2021) 30 capsule 0   ondansetron (ZOFRAN) 8 MG tablet Take 1 tablet (8 mg total) by mouth every 8 (eight) hours as needed for nausea or vomiting. Starting day 3 after chemotherapy (Patient not taking: Reported on 01/08/2021) 30 tablet 2   prochlorperazine (COMPAZINE) 10 MG tablet Take 1 tablet (10 mg total) by mouth every 6 (six) hours as needed. (Patient taking differently: Take 10 mg by mouth every 6 (six) hours as needed for nausea or vomiting.) 30 tablet 2   sucralfate (CARAFATE) 1 g tablet Take 1 tablet (1 g total) by mouth 4 (four) times daily -  with meals and at bedtime. Crush and dissolve in 10 mL of warm water prior to swallowing, take 20 minutes before meals 120 tablet 1   traMADol (ULTRAM) 50 MG tablet Take 1 tablet (50 mg total) by mouth 2 (two) times daily. 60 tablet 0   No current facility-administered medications for this encounter.    Physical Findings: The patient is in no acute distress. Patient is alert and oriented. Wt Readings from Last 3 Encounters:  02/26/21 110 lb 5 oz (50 kg)  02/14/21 106 lb 4.8 oz (48.2 kg)  01/24/21 111 lb 12 oz (50.7 kg)    vitals were not taken for this visit. .  General: Alert and oriented, in no acute distress HEENT: Head is normocephalic. Extraocular movements are intact. Oropharynx is notable for no tumor in upper throat but early thrush is in mouth; he is less hoarse Neck: Neck is notable for dry skin and palpable adenopathy R>L cervical neck, much decreased from pre-tx exam Skin: Skin in treatment fields is dry    Lab Findings: Lab Results  Component Value Date   WBC 3.1 (L) 02/14/2021   HGB 10.6 (L) 02/14/2021   HCT 31.6 (L) 02/14/2021   MCV 89.0 02/14/2021   PLT 207 02/14/2021    Lab Results  Component  Value Date   TSH <0.080 (L) 12/05/2020    Radiographic Findings: No results found.  Impression/Plan:    1) Head and Neck Cancer Status: healing from RT  2) Nutritional Status:  Wt Readings from Last 3 Encounters:  02/26/21 110 lb 5 oz (50 kg)  02/14/21 106 lb 4.8 oz (48.2 kg)  01/24/21 111 lb 12 oz (50.7 kg)  Stabilizing but needs to continue to push intake as tolerated  PEG tube: none  3) Risk Factors: The patient  has been educated about risk factors including alcohol and tobacco abuse; they understand that avoidance of alcohol and tobacco is important to prevent recurrences as well as other cancers  I asked the patient today about tobacco use. The patient uses tobacco.  I advised the patient to quit. Services were offered by me today including outpatient counseling and pharmacotherapy. I assessed for the willingness to attempt to quit and provided encouragement and demonstrated willingness to make referrals and/or prescriptions to help the patient attempt to quit. The patient has follow-up with the oncologic team to touch base on their tobacco use and /or cessation efforts.  Over 5 minutes were spent on this issue. Nicotine patches prescribed today and he plans to quit in the next month (by March 1).  4) Swallowing: continue SLP exercises, functional  5) Dental: Encouraged to continue regular followup with dentistry, and dental hygiene including fluoride rinses.   6) Thyroid function: Free T4 2.14 on 12/05/20 (hyperthyroid)  Referral to endocrinology made but I am not sure he saw anyone. I will ask Juliette Mangle to look into this.  Lab Results  Component Value Date   TSH <0.080 (L) 12/05/2020   7) Other: Thrush - refilled clotrimazole. Lidocaine elixir refilled for sore throat.  8) Follow-up in 2.5 months with PET restaging scan. The patient was encouraged to call with any issues or questions before then.  On date of service, in total, I spent 25 minutes on this encounter.  Patient was seen in person. _____________________________________   David Gibson, MD

## 2021-02-28 ENCOUNTER — Telehealth: Payer: Self-pay | Admitting: *Deleted

## 2021-02-28 NOTE — Progress Notes (Signed)
° °                                                                                                                                                          °  Patient Name: David [name] MRN: [id number] DOB: 08-18-1975 Referring Physician: Ebbie Latus A Date of Service: 02/09/2021 [facility] [place]                                                        End Of Treatment Note  Diagnoses: C32.8-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  Cancer Staging:  Cancer Staging  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 [facility]) Staging form: Larynx - Supraglottis, AJCC 8th Edition - Clinical stage from 12/01/2020: Stage IVB (cT3, cN3b, cM0) - Signed by [name], MD on 12/20/2020 Stage prefix: Initial diagnosis  Intent: Curative  Radiation Treatment Dates: 12/18/2020 through 02/09/2021 Site Technique Total Dose (Gy) Dose per Fx (Gy) Completed Fx Beam Energies  Larynx: HN_Larynx IMRT 70/70 2 35/35 6X   Narrative: The patient tolerated radiation therapy relatively well with clinical response.   Plan: The patient will follow-up with radiation oncology in 2-3 wks. -----------------------------------  [name], MD

## 2021-02-28 NOTE — Telephone Encounter (Signed)
CALLED REFERRAL TO [facility], SPOKE WITH OFFICE REP AND SHE SAID THAT THEY WOULD WORK ON THIS REFERRAL, CONFIRMATION  RECEIVED THAT REFERRAL [place]

## 2021-03-01 ENCOUNTER — Other Ambulatory Visit: Payer: Self-pay

## 2021-03-01 ENCOUNTER — Ambulatory Visit: Payer: Medicaid Other | Attending: Radiation Oncology

## 2021-03-01 DIAGNOSIS — R1310 Dysphagia, unspecified: Secondary | ICD-10-CM | POA: Insufficient documentation

## 2021-03-01 NOTE — Therapy (Addendum)
[facility] [address] [place], [place], [address] Phone: [phone]   Fax:  [phone]  Speech Language Pathology Treatment/Renewal Summary  Patient Details  Name: David [name] MRN: [id number] Date of Birth: 01-08-1976 Referring Provider (SLP): [name], MD   Encounter Date: 03/01/2021   End of Session - 03/01/21 1540     Visit Number 3    Number of Visits 5    Date for SLP Re-Evaluation 05/30/21    Authorization Type Medicaid    SLP Start Time 1449    SLP Stop Time  63    SLP Time Calculation (min) 26 min    Activity Tolerance Patient tolerated treatment well   slight agitation after he coughed on first sip water            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[place])    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 ([place])    Pneumothorax    Left lung spontaneous pneumothorax at age 56 yr     Past Surgical History:  Procedure Laterality Date   chest tubes Left     There were no vitals filed for this visit.   Subjective Assessment - 03/01/21 1503     Subjective Potstickers, ice cream sandwich.    Currently in Pain? No/denies                   ADULT SLP TREATMENT - 03/01/21 1504       General Information   Behavior/Cognition Cooperative;Alert;Pleasant mood      Treatment Provided   Treatment provided Dysphagia      Dysphagia Treatment   Temperature Spikes Noted No    Respiratory Status Room air    Treatment Methods Skilled observation;Therapeutic exercise;Patient/caregiver education;Compensation strategy training    Patient observed directly with PO's Yes    Type of PO's observed Dysphagia 1 (puree);Thin liquids   politely refused solids like pretzels, sandwich   Liquids provided via Cup    Oral Phase Signs & Symptoms Other (comment)   nothing noted with water today   Pharyngeal Phase Signs & Symptoms Immediate throat clear   1/4/sips   Other  treatment/comments "when I do several gulps at once, or take it too fast I get choked with water." Pt with throat clear on 1/4 sips, SLP encouraged pt to take smaller sips and single sips instead of subsequent multiple sips. Pt completed HEP with rare SBA, [name] tells SLP he does 20 reps of each exercise dailiy. Pt told SLP rationale for HEP with modified independence, adn told SLP 3 overt s/sx aspiration pNA.      Assessment / Recommendations / Plan   Plan Continue with current plan of care      Dysphagia Recommendations   Diet recommendations --   as tolerated   Medication Administration --   as tolerated   Compensations Small sips/bites      Progression Toward Goals   Progression toward goals Progressing toward goals              SLP Education - 03/01/21 1539     Education Details small sips/single sips!, overt s/sx aspiration PNA    Person(s) Educated Patient    Methods Explanation;Handout    Comprehension Verbalized understanding;Returned demonstration            Short Term Goals 03/01/21        SLP SHORT TERM GOAL #1  Title pt will complete HEP with occasional min A    Time     Period --   vists, for all STGs    Status Met           SLP SHORT TERM GOAL #2    Title pt will tell SLP why pt is completing HEP with modified independence    Time     Status Met           SLP SHORT TERM GOAL #3    Title pt will describe 3 overt s/s aspiration PNA with modified independence    Time     Status Met           SLP SHORT TERM GOAL #4    Title pt will tell SLP how a food journal could hasten return to a more normalized diet    Time 1    Status ongoing                     Long Term Goals 03/01/21                 SLP LONG TERM GOAL #1    Title pt will complete HEP with modified independence over two visits    Time 2    Period --   visits, for all LTGs    Status ongoing           SLP LONG TERM GOAL #2    Title pt will describe how to modify HEP over time,  and the timeline associated with reduction in HEP frequency with modified independence over two sessions    Time 3    Status ongoing       Plan - 03/01/21      Clinical Impression Statement At this time pt swallowing is deemed [facility] with dys III diet / thin liquids. SLP reviewed pt's individualized HEP for dysphagia and pt completed each exercise on their own with rare SBA. There are no overt s/s aspiration reported by pt at this time, nor any overt s/sx aspiration PNA noted. Data indicate that pt's swallow ability will likely decrease over the course of radiation/chemotherapy and could very well decline over time following conclusion of their radiation therapy due to muscle disuse atrophy and/or muscle fibrosis. Pt will cont to need to be seen by SLP in order to assess safety of PO intake, assess the need for recommending any objective swallow assessment, and ensuring pt correctly completes the individualized HEP.    Speech Therapy Frequency --   once approx every four weeks    Duration --   90 days/3 therapy sessions for this reporting period;  Overall plan of care to include approx 7 visits    Treatment/Interventions Aspiration precaution training;Pharyngeal strengthening exercises;Diet toleration management by SLP;Trials of upgraded texture/liquids;Patient/family education;SLP instruction and feedback;Compensatory techniques    Potential to Achieve Goals Good; decr'd participation/lack of cooperation          Patient will benefit from skilled therapeutic intervention in order to improve the following deficits and impairments:   Dysphagia, unspecified type - Plan: SLP plan of care cert/re-cert    Problem List Patient Active Problem List   Diagnosis Date Noted   Mucositis due to antineoplastic therapy 02/14/2021   Drug abuse and dependence ([place]) 01/15/2021   Weight loss, unintentional 01/15/2021   Protein-calorie malnutrition, severe 01/10/2021   Hypomagnesemia 01/09/2021    Hypokalemia 01/09/2021   Pancytopenia ([place]) 01/09/2021   Hypotension 01/08/2021  AKI (acute kidney injury) ([place]) 01/08/2021   Hyponatremia 01/08/2021   Leukopenia due to antineoplastic chemotherapy ([place]) 01/08/2021   SIRS (systemic inflammatory response syndrome) ([place]) 01/08/2021   Chemotherapy induced nausea and vomiting 01/01/2021   Goals of care, counseling/discussion 12/13/2020   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 ([place]) 12/04/2020   Encounter for preoperative dental examination 12/04/2020   Teeth missing 12/04/2020   Caries 12/04/2020   Retained tooth root 12/04/2020   Chronic apical periodontitis 12/04/2020   Impacted third molar tooth 12/04/2020   Attrition, teeth excessive 12/04/2020   Chronic periodontitis 12/04/2020   Accretions on teeth 12/04/2020   Incipient enamel caries 12/04/2020    [place], CCC-SLP 03/01/2021, 3:51 PM  [facility] [address] [place], [place], [address] Phone: [phone]   Fax:  [phone]   Name: Hatcher Froning MRN: [id number] Date of Birth: 1975/07/15

## 2021-03-05 ENCOUNTER — Other Ambulatory Visit: Payer: Medicaid Other

## 2021-03-06 ENCOUNTER — Ambulatory Visit: Payer: Medicaid Other | Attending: Radiation Oncology | Admitting: Physical Therapy

## 2021-03-06 ENCOUNTER — Encounter: Payer: Self-pay | Admitting: Physical Therapy

## 2021-03-06 ENCOUNTER — Other Ambulatory Visit: Payer: Self-pay

## 2021-03-06 DIAGNOSIS — C328 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: Secondary | ICD-10-CM | POA: Insufficient documentation

## 2021-03-06 DIAGNOSIS — R293 Abnormal posture: Secondary | ICD-10-CM | POA: Diagnosis not present

## 2021-03-06 NOTE — Therapy (Signed)
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[place])    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 ([place])    Pneumothorax    Left lung spontaneous pneumothorax at age 18 yr    Past Surgical History:  Procedure Laterality Date   chest tubes Left    Patient Active Problem List   Diagnosis Date Noted   Mucositis due to antineoplastic therapy 02/14/2021   Drug abuse and dependence ([place]) 01/15/2021   Weight loss, unintentional 01/15/2021   Protein-calorie malnutrition, severe 01/10/2021   Hypomagnesemia 01/09/2021   Hypokalemia 01/09/2021   Pancytopenia ([place]) 01/09/2021   Hypotension 01/08/2021   AKI (acute kidney injury) ([place]) 01/08/2021   Hyponatremia 01/08/2021   Leukopenia due to antineoplastic chemotherapy ([place]) 01/08/2021   SIRS (systemic inflammatory response syndrome) ([place]) 01/08/2021   Chemotherapy induced nausea and vomiting 01/01/2021   Goals of care, counseling/discussion 12/13/2020   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 ([place]) 12/04/2020   Encounter for preoperative dental examination 12/04/2020   Teeth missing 12/04/2020   [place] 12/04/2020   Retained tooth root 12/04/2020   Chronic apical periodontitis 12/04/2020   Impacted third molar tooth 12/04/2020   Attrition, teeth excessive 12/04/2020   Chronic periodontitis 12/04/2020   Accretions on teeth 12/04/2020   Incipient enamel caries 12/04/2020    PCP: Default, Provider, MD  REFERRING PROVIDER: [name],  MD  REFERRING DIAG: C32.8 (ICD-10-CM) - 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 ([place])   THERAPY DIAG:  Abnormal posture  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 [facility])  ONSET DATE: 12/08/20  SUBJECTIVE    SUBJECTIVE STATEMENT: Patient reports they are here today to be seen by their medical team for her newly diagnosed laryngeal cancer. My chest started giving me pain in my lungs. I am hoping to get another scan soon.   PERTINENT HISTORY:  Pt presented to the ED on 11/20/20 with right sided neck swelling for approx. 6 weeks. 11/20/20 CT neck revealed asymmetric soft tissue thickening of the right oropharynx, extending into the right supraglottis and glottis, and enlarged right level 2 lymph node concerning for malignancy. 11/23/20 He was seen as an outpatient by Dr. Fredric Dine. FNA revealed scattered benign squamous cells in the background of blood. No definitive evidence of dysplasia or carcinoma was identified on this limited sample. 12/01/20 Consult with Dr. Isidore Moos; 12/05/20 Consult with Dr. Julien Nordmann. 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 12/12/20 PET revealed right oropharyngeal, hypopharyngeal, and supraglottic mass extending down to the level of the glottis. Bil level II, right level III and right paracentral Vi adenopathy and left supraclavicular and a right upper thoracic paraesophageal hypermetabolic lymph node.  He will receive 35 fractions of radiation to his Larynx and bilateral neck along with chemotherapy once every 3 weeks ([place]).   He started on 12/18/20 and will complete on 02/08/21.  PATIENT GOALS  to be educated about the signs and symptoms of lymphedema and learn post op HEP.   PAIN:  Are you having pa in? Yes NPRS scale: 8/10 Pain location: throat Pain orientation: Left  PAIN TYPE: raw, stinging Pain description: intermittent  Aggravating factors: waking up, moving around  Relieving factors: pain  medicine  PRECAUTIONS: Comment (at risk for lymphedema)  WEIGHT BEARING RESTRICTIONS No  FALLS:  Has patient fallen in last 6 months? No Does the patient have a fear of falling that limits activity? No Is the patient reluctant to leave the house due to a fear of falling?No  LIVING ENVIRONMENT: Patient lives with: mom Lives in: House/apartment   OCCUPATION: applied for disability  LEISURE: pt does not exercise  PRIOR LEVEL OF FUNCTION: Independent   OBJECTIVE  COGNITION:           Overall cognitive status: Within functional limits for tasks assessed                  POSTURE:  Forward head and rounded shoulders posture   30 SEC SIT TO STAND: 21 reps in 30 sec without use of UEs which is  Average for patient's age   SHOULDER AROM:   WFL    CERVICAL AROM:   Percent limited  Flexion WFL  Extension WFL  Right lateral flexion WFL  Left lateral flexion WFL  Right rotation WFL  Left rotation                 WFL    (Blank rows=not tested)   CIRCUMFERENCE MEASUREMENTS OF NECK    4 cm superior to sternal notch: 35 cm   6 cm superior to sternal notch: 34.1 cm   8 cm superior to sternal notch: 34 cm       GAIT:  Assessed: Yes Assistance needed: Independent  Ambulation Distance: 10 feet  Assistive Device:  Gait pattern: WFL Ambulation surface: Level  PATIENT EDUCATION:  Education details: Neck ROM, importance of posture when sitting, standing and lying down, deep breathing, walking program and importance of staying active throughout treatment, CURE article on staying active, "Why exercise?" flyer, lymphedema and PT info Person educated: Patient Education method: Explanation, Demonstration, Handout Education comprehension: Patient verbalized understanding and returned demonstration   HOME EXERCISE PROGRAM: Patient was instructed today in a home exercise program today for head and neck range of motion exercises. These included active cervical flexion, active  cervical extension, active cervical rotation to each direction, upper trap stretch, and shoulder retraction. Patient was encouraged to do these 2-3 times a day, holding for 5 sec each and completing for 5 reps. Pt was educated that once this becomes easier then hold the stretches for 30-60 seconds.    ASSESSMENT:  CLINICAL IMPRESSION: Pt arrives to PT with recently diagnosed laryngeal cancer. Pt will completed radiation and stopped chemo prior to his last treatment. Pt's cervical ROM was [facility]. Educated pt about signs and symptoms of lymphedema as well as anatomy and physiology of lymphatic system. Educated pt in importance of staying as active as possible throughout treatment and after treatment to decrease fatigue as well as head and neck ROM exercises to decrease loss of ROM. Pt does not demonstrate any further needs for PT. Pt will be discharged from skilled PT services at this time.     PT treatment/interventions: ADL/self-care home management, pt/family education, therapeutic exercise.  REHAB POTENTIAL: Excellent  CLINICAL DECISION MAKING: Stable/uncomplicated  EVALUATION COMPLEXITY: Low   GOALS: Goals reviewed with patient?  YES  LONG TERM GOALS: (STG=LTG)   Name Target Date Goal status  1 Patient will be able to verbalize understanding of a home exercise program for cervical range of motion, posture, and walking.   Baseline:  No knowledge 03/06/2021 Achieved at eval  2 Patient will be able to verbalize understanding of proper sitting and standing posture. Baseline:  No knowledge 03/06/2021 Achieved at eval  3 Patient will be able to verbalize understanding of lymphedema risk and availability of treatment for this condition Baseline:  No knowledge 03/06/2021 Achieved at eval     PLAN: PT FREQUENCY/DURATION: EVAL only  PLAN FOR NEXT SESSION: d/c this session  The patient was educated today on appropriate basic range of motion exercises to begin now and educated on the signs and  symptoms of lymphedema. Patient verbalized good understanding.     Physical Therapy Information for During and After Head/Neck Cancer Treatment: Lymphedema is a swelling condition that you may be at risk for in your neck and/or face if you have radiation treatment to the area and/or if you have surgery that includes removing lymph nodes.  There is treatment available for this condition and it is not life-threatening.  Contact your physician or physical therapist with concerns. An excellent resource for those seeking information on lymphedema is the National Lymphedema Network's website.  It can be accessed at [place].org If you notice swelling in your neck or face at any time following surgery (even if it is many years from now), please contact your doctor or physical therapist to discuss this.  Lymphedema can be treated at any time but it is easier for you if it is treated early on. If you have had surgery to your neck, please check with your surgeon about how soon to start doing neck range of motion exercises.  If you are not having surgery, I encourage you to start doing neck range of motion exercises today and continue these while undergoing treatment, UNLESS you have irritation of your skin or soft tissue that is aggravated by doing them.  These exercises are intended to help you prevent loss of range of motion and/or to gain range of motion in your neck (which can be limited by tightening effects of radiation), and NOT to aggravate these tissues if they develop sensitivities from treatment. Neck range of motion exercises should be done to the point of feeling a GENTLE, TOLERABLE stretch only.  You are encouraged to start a walking or other exercise program tomorrow and continue this as much as you are able through and after treatment.  Please feel free to call me with any questions. Manus Gunning, PT, CLT Physical Therapist and Certified Lymphedema Therapist [facility] [address], [address], [place], [place] [address] [phone] [email or web address]  WALKING  Walking is a great form of exercise to increase your strength, endurance and overall fitness.  A walking program can help you start slowly and gradually build endurance as you go.  Everyone's ability is different, so each person's starting point will be different.  You do not have to follow them exactly.  The are just samples. You should simply find out what's right for you and stick to that program.   In the beginning, you'll start off walking 2-3 times a day for short distances.  As you get stronger, you'll be walking further at just 1-2 times per day.  A. You Can Walk For A Certain Length Of Time Each Day    Walk 5  minutes 3 times per day.  Increase 2 minutes every 2 days (3 times per day).  Work up to 25-30 minutes (1-2 times per day).   Example:   Day 1-2 5 minutes 3 times per day   Day 7-8 12 minutes 2-3 times per day   Day 13-14 25 minutes 1-2 times per day  B. You Can Walk For a Certain Distance Each Day     Distance can be substituted for time.    Example:   3 trips to mailbox (at road)   3 trips to corner of block   3 trips around the block  C. Go to local high school and use the track.    Walk for distance ____ around track  Or time ____ minutes  D. Walk ____ Jog ____ Run ___   Why exercise?  So many benefits! Here are SOME of them: Heart health, including raising your good cholesterol level and reducing heart rate and blood pressure Lung health, including improved lung capacity It burns fats, and most of [name] can stand to be leaner, whether or not we are overweight. It increases the body's natural painkillers and mood elevators, so makes you feel better. Not only makes you feel better, but look better too Improves sleep Takes a bite out of stress May decrease your risk of many types of cancer If you are currently undergoing cancer  treatment, exercise may improve your ability to tolerate treatments including chemotherapy. For everybody, it can improve your energy level. Those with cancer-related fatigue report a 40-50% reduction in this symptom when exercising regularly. If you are a survivor of breast, colon, or prostate cancer, it may decrease your risk of a recurrence. (This may hold for other cancers too, but so far we have data just for these three types.)  How to exercise: Get your doctor's okay. Pick something you enjoy doing, like walking, Zumba, biking, swimming, or whatever. Start at low intensity and time, then gradually increase.  (See walking program handout.) Set a goal to achieve over time.  The American Cancer Society, American Heart Association, and U.S. Dept. of Health and Human Services recommend 150 minutes of moderate exercise, 75 minutes of vigorous exercise, or a combination of both per week. This should be done in episodes at least 10 minutes long, spread throughout the week.  Need help being motivated? Pick something you enjoy doing, because you'll be more inclined to stick with that activity than something that feels like a chore. Do it with a friend so that you are accountable to each other. Schedule it into your day. Place it on your calendar and keep that appointment just like you do any appointment that you make. Join an exercise group that meets at a specific time.  That way, you have to show up on time, and that makes it harder to procrastinate about doing your workout.  It also keeps you accountable--people begin to expect you to be there. Join a gym where you feel comfortable and not intimidated, at the right cost. Sign up for something that you'll need to be in shape for on a specific date, like a 1K or a 5K to walk or run, a 20 or 30 mile bike ride, a mud run or something like that. If the date is looming, you know you'll need to train to be ready for it.  An added benefit is that many of  these are fundraisers for good causes. If you've already paid for a gym membership,  group exercise class or event, you might as well work out, so you haven't wasted your money!    Allyson Sabal Bressler, PT 03/06/2021, 3:33 PM

## 2021-03-07 ENCOUNTER — Ambulatory Visit: Payer: Self-pay

## 2021-03-07 ENCOUNTER — Encounter: Payer: Self-pay | Admitting: Nurse Practitioner

## 2021-03-07 ENCOUNTER — Inpatient Hospital Stay (HOSPITAL_BASED_OUTPATIENT_CLINIC_OR_DEPARTMENT_OTHER): Payer: Medicaid Other | Admitting: Nurse Practitioner

## 2021-03-07 ENCOUNTER — Encounter: Payer: Self-pay | Admitting: Adult Health

## 2021-03-07 ENCOUNTER — Inpatient Hospital Stay (HOSPITAL_BASED_OUTPATIENT_CLINIC_OR_DEPARTMENT_OTHER): Payer: Medicaid Other | Admitting: Adult Health

## 2021-03-07 ENCOUNTER — Ambulatory Visit: Payer: Self-pay | Admitting: Hematology and Oncology

## 2021-03-07 ENCOUNTER — Inpatient Hospital Stay: Payer: Medicaid Other | Attending: Internal Medicine

## 2021-03-07 VITALS — BP 111/74 | HR 68 | Temp 97.7°F | Resp 18 | Ht 71.0 in | Wt 110.0 lb

## 2021-03-07 DIAGNOSIS — Z515 Encounter for palliative care: Secondary | ICD-10-CM | POA: Diagnosis not present

## 2021-03-07 DIAGNOSIS — Z923 Personal history of irradiation: Secondary | ICD-10-CM | POA: Diagnosis not present

## 2021-03-07 DIAGNOSIS — R530 Neoplastic (malignant) related fatigue: Secondary | ICD-10-CM | POA: Diagnosis not present

## 2021-03-07 DIAGNOSIS — G893 Neoplasm related pain (acute) (chronic): Secondary | ICD-10-CM

## 2021-03-07 DIAGNOSIS — Z9221 Personal history of antineoplastic chemotherapy: Secondary | ICD-10-CM | POA: Insufficient documentation

## 2021-03-07 DIAGNOSIS — C328 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: Secondary | ICD-10-CM | POA: Diagnosis present

## 2021-03-07 DIAGNOSIS — R070 Pain in throat: Secondary | ICD-10-CM | POA: Insufficient documentation

## 2021-03-07 DIAGNOSIS — Z79899 Other long term (current) drug therapy: Secondary | ICD-10-CM | POA: Insufficient documentation

## 2021-03-07 LAB — CBC WITH DIFFERENTIAL (CANCER CENTER ONLY)
Abs Immature Granulocytes: 0.01 10*3/uL (ref 0.00–0.07)
Basophils Absolute: 0 10*3/uL (ref 0.0–0.1)
Basophils Relative: 0 %
Eosinophils Absolute: 0.1 10*3/uL (ref 0.0–0.5)
Eosinophils Relative: 2 %
HCT: 37.2 % — ABNORMAL LOW (ref 39.0–52.0)
Hemoglobin: 12.1 g/dL — ABNORMAL LOW (ref 13.0–17.0)
Immature Granulocytes: 0 %
Lymphocytes Relative: 8 %
Lymphs Abs: 0.4 10*3/uL — ABNORMAL LOW (ref 0.7–4.0)
MCH: 30.9 pg (ref 26.0–34.0)
MCHC: 32.5 g/dL (ref 30.0–36.0)
MCV: 94.9 fL (ref 80.0–100.0)
Monocytes Absolute: 0.7 10*3/uL (ref 0.1–1.0)
Monocytes Relative: 14 %
Neutro Abs: 3.5 10*3/uL (ref 1.7–7.7)
Neutrophils Relative %: 76 %
Platelet Count: 183 10*3/uL (ref 150–400)
RBC: 3.92 MIL/uL — ABNORMAL LOW (ref 4.22–5.81)
RDW: 18.4 % — ABNORMAL HIGH (ref 11.5–15.5)
WBC Count: 4.7 10*3/uL (ref 4.0–10.5)
nRBC: 0 % (ref 0.0–0.2)

## 2021-03-07 LAB — CMP (CANCER CENTER ONLY)
ALT: 58 U/L — ABNORMAL HIGH (ref 0–44)
AST: 57 U/L — ABNORMAL HIGH (ref 15–41)
Albumin: 4 g/dL (ref 3.5–5.0)
Alkaline Phosphatase: 74 U/L (ref 38–126)
Anion gap: 4 — ABNORMAL LOW (ref 5–15)
BUN: 11 mg/dL (ref 6–20)
CO2: 33 mmol/L — ABNORMAL HIGH (ref 22–32)
Calcium: 9.4 mg/dL (ref 8.9–10.3)
Chloride: 99 mmol/L (ref 98–111)
Creatinine: 0.65 mg/dL (ref 0.61–1.24)
GFR, Estimated: >60 mL/min (ref >60)
Glucose, Bld: 91 mg/dL (ref 70–99)
Potassium: 4.3 mmol/L (ref 3.5–5.1)
Sodium: 136 mmol/L (ref 135–145)
Total Bilirubin: 0.4 mg/dL (ref 0.3–1.2)
Total Protein: 7.7 g/dL (ref 6.5–8.1)

## 2021-03-07 LAB — MAGNESIUM: Magnesium: 2 mg/dL (ref 1.7–2.4)

## 2021-03-07 NOTE — Progress Notes (Signed)
[place]  [phone] [phone]   Name: David [name] Date: 03/07/2021 MRN: [id number]  DOB: 09-29-1975  Patient Care Team: Default, Provider, MD as PCP - General [name], [name], RN as Oncology Nurse Navigator [name], [name], DO as Consulting Physician (Otolaryngology) [name], MD as Consulting Physician (Radiation Oncology) [name], MD as Consulting Physician (Hematology and Oncology)    REASON FOR CONSULTATION: David [name] is a 46 y.o. 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 We were initially seeing him for pain however due to breach in contract he was dismissed from or clinic. Patient admitted and also observed while hospitalized using heroin. He was then referred back to [facility] for management of his methadone. Palliative ask to see for symptom management given severe mucositis and pain. I have discussed visit with patient and mother and they are clear all prescribed medications will be with caution and limited given previous interaction.    SOCIAL HISTORY:     reports that he has been smoking cigarettes. He has been smoking an average of 3 packs per day. He has never used smokeless tobacco. He reports that he does not currently use alcohol after a past usage of about 12.0 standard drinks per week. He reports that he does not currently use drugs after having used the following drugs: IV.  ADVANCE DIRECTIVES:  Patient reports he does not have advanced directives.  Would like to further discuss in the future.  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[place])    Malignant neoplasm of overlapping sites of  larynx ([place])    Pneumothorax    Left lung spontaneous pneumothorax at age 26 yr     PAST SURGICAL HISTORY:  Past Surgical History:  Procedure Laterality Date   chest tubes Left       ALLERGIES:  has No Known Allergies.  MEDICATIONS:  Current Outpatient Medications  Medication Sig Dispense Refill   benzonatate (TESSALON) 100 MG capsule Take 1 capsule (100 mg total) by mouth 3 (three) times daily as needed for cough. 60 capsule 0   clotrimazole (MYCELEX) 10 MG troche Take 1 tablet (10 mg total) by mouth 5 (five) times daily. Suck on these slowly. 70 Troche 0   dexamethasone (DECADRON) 4 MG tablet Take 1 tablet PO BID; take with food. 40 tablet 0   dronabinol (MARINOL) 2.5 MG capsule Take 2.5 mg by mouth 2 (two) times daily before a meal.     guaiFENesin (MUCINEX) 600 MG 12 hr tablet Take 2 tablets (1,200 mg total) by mouth 2 (two) times daily. 60 tablet 1   ibuprofen (ADVIL,MOTRIN) 200 MG tablet Take 600 mg by mouth every 6 (six) hours as needed for moderate pain.     lidocaine (XYLOCAINE) 2 % solution Patient: Mix 1part 2% viscous lidocaine, 1part H20. Swish/swallow 64mL of diluted mixture, 23min before meals and @bedtime , up to QID prn soreness 200 mL 2   methadone (DOLOPHINE) 10 MG/ML solution Take 6 mLs (60 mg total) by mouth daily. (Patient taking differently: Take 20 mg by mouth every 8 (eight) hours as needed for severe pain.) 90 mL 0   nicotine (NICODERM CQ - DOSED IN MG/24 HOURS) 14 mg/24hr patch Place 1 patch (14 mg total) onto the skin daily.  Apply 21 mg patch daily x 6 wk, then 14mg  patch daily x 2 wk, then 7 mg patch daily x 2 wk 14 patch 0   nicotine (NICODERM CQ - DOSED IN MG/24 HOURS) 21 mg/24hr patch Place 1 patch (21 mg total) onto the skin daily. Apply 21 mg patch daily x 6 wk, then 14mg  patch daily x 2 wk, then 7 mg patch daily x 2 wk 14 patch 2   nicotine (NICODERM CQ - DOSED IN MG/24 HR) 7 mg/24hr patch Place 1 patch (7 mg total) onto the skin daily. Apply 21 mg patch  daily x 6 wk, then 14mg  patch daily x 2 wk, then 7 mg patch daily x 2 wk 14 patch 0   omeprazole (PRILOSEC) 20 MG capsule Take 1 capsule (20 mg total) by mouth daily. 30 capsule 0   ondansetron (ZOFRAN) 8 MG tablet Take 1 tablet (8 mg total) by mouth every 8 (eight) hours as needed for nausea or vomiting. Starting day 3 after chemotherapy 30 tablet 2   prochlorperazine (COMPAZINE) 10 MG tablet Take 1 tablet (10 mg total) by mouth every 6 (six) hours as needed. (Patient taking differently: Take 10 mg by mouth every 6 (six) hours as needed for nausea or vomiting.) 30 tablet 2   sucralfate (CARAFATE) 1 g tablet Take 1 tablet (1 g total) by mouth 4 (four) times daily -  with meals and at bedtime. Crush and dissolve in 10 mL of warm water prior to swallowing, take 20 minutes before meals 120 tablet 1   traMADol (ULTRAM) 50 MG tablet Take 1 tablet (50 mg total) by mouth 2 (two) times daily. 60 tablet 0   No current facility-administered medications for this visit.    VITAL SIGNS: There were no vitals taken for this visit. There were no vitals filed for this visit.   Estimated body mass index is 15.34 kg/m as calculated from the following:   Height as of an earlier encounter on 03/07/21: 5\' 11"  (1.803 m).   Weight as of an earlier encounter on 03/07/21: 110 lb (49.9 kg). RADIOGRAPHIC STUDIES: No results found.  PERFORMANCE STATUS (ECOG) : 1 - Symptomatic but completely ambulatory   Physical Exam General: NAD, thin Cardiovascular: regular rate and rhythm Pulmonary: clear ant fields, normal breathing pattern HEENT: no observed mouth ulcerations or redness  Abdomen: soft, nontender, + bowel sounds Extremities: no edema, no joint deformities Neurological: AAOx3, mood appropriate  IMPRESSION:  David [name] presents to the clinic today  for follow-up. His mother, David [name] is also present. He was seen by Mendel Ryder, NP today.   No acute distress noted. Well groomed. He has cut his hair low due to some  thinning. Reports his appetite continues to improve. States he is "starving" right now because he hasn't eaten breakfast yet. Does continue to endorse some throat pain although much improved. Cough improved with use of Tessalon Perles. He expressed appreciation.   Tolerating tramadol for pain. Advised he may take 1-2 tablets as needed twice a day for his pain. Reports he needs something stronger as the pain is not completely resolved with current regimen. Recommendations to include Tylenol 1000 mg TID in addition. We will continue to closely follow but with hesitancy on prescribing controlled narcotics given history of heroin use. He reports he is not currently using and is doing everything "the right way!" To keep from jeopardizing his health and medical teams' support.   PLAN:  Continue Mucinex 100 mg BID  Continue Tessalon Perles 100  mg BID as needed for cough. Cough and congestion much improved.  Tramadol 50-100 mg BID as needed for pain  [facility] for methadone management   Education provided on humidifier use and importance of good oral care.  I will plan to see patient back in the clinic in 2-3 weeks.   Patient and mother expressed understanding and was in agreement with this plan. He also understands that He can call the clinic at any time with any questions, concerns, or complaints.   Time Total: 30 min.   Visit consisted of counseling and education dealing with the complex and emotionally intense issues of symptom management and palliative care in the setting of serious and potentially life-threatening illness.Greater than 50%  of this time was spent counseling and coordinating care related to the above assessment and plan.  Signed by: Alda Lea, AGPCNP-BC [place]

## 2021-03-07 NOTE — Assessment & Plan Note (Signed)
This is a 46 year old male patient with squamous cell carcinoma of the larynx who is status post concurrent chemoradiation where he stopped chemo early due to significant throat pain and mucositis.  1.  Head neck cancer: He is status posttreatment.  Right now we are monitoring and will wait for PET scan to direct further therapy.  2.  Weight loss and deconditioning: His BMI is 15 classifying him as underweight.  He is working on eating drinking and regaining his strength.  I reviewed with him that should be his focus for the next few weeks.  3. throat pain: He is following up with palliative care today.  He continues on methadone and recently picked up his tramadol.  We will see him back in 4 weeks for labs and monitoring.

## 2021-03-07 NOTE — Progress Notes (Signed)
[place] Cancer Follow up:    Default, Provider, MD No address on file   DIAGNOSIS:  Cancer Staging  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 [facility]) Staging form: Larynx - Supraglottis, AJCC 8th Edition - Clinical stage from 12/01/2020: Stage IVB (cT3, cN3b, cM0) - Signed by [name], MD on 12/20/2020 Stage prefix: Initial diagnosis   SUMMARY OF ONCOLOGIC HISTORY: Oncology History  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 ([place])  12/01/2020 Cancer Staging   Staging form: Larynx - Supraglottis, AJCC 8th Edition - Clinical stage from 12/01/2020: Stage IVB (cT3, cN3b, cM0) - Signed by [name], MD on 12/20/2020 Stage prefix: Initial diagnosis    12/04/2020 Initial Diagnosis   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 [facility])   12/25/2020 - 01/24/2021 Chemotherapy   Patient is on Treatment Plan : HEAD/NECK Cisplatin + XRT q21d       CURRENT THERAPY: s/p chemo and radiation  INTERVAL HISTORY: David [name] 46 y.o. male returns for evaluation of his head neck cancer.  He stopped chemotherapy earlier and has been regaining his strength.  He has continued to have mouth and throat pain.  He notes that he is taking tramadol as needed and he will start this today.  His weight is stable.  He is eating and drinking as he can.  He has a PET scan ordered for April.  He tells me that he has been able to remain off heroin.  He notes that it has been difficult but he has managed in it has gotten easier with each week.  He says that he is living at his mom's house and though it is difficult to be at home with his mother he tells me it is better than being on the street.   Patient Active Problem List   Diagnosis Date Noted   Mucositis due to antineoplastic therapy 02/14/2021   Drug abuse and dependence ([place]) 01/15/2021   Weight loss, unintentional 01/15/2021   Protein-calorie malnutrition, severe 01/10/2021   Hypomagnesemia 01/09/2021    Hypokalemia 01/09/2021   Pancytopenia ([place]) 01/09/2021   Hypotension 01/08/2021   AKI (acute kidney injury) ([place]) 01/08/2021   Hyponatremia 01/08/2021   Leukopenia due to antineoplastic chemotherapy ([place]) 01/08/2021   SIRS (systemic inflammatory response syndrome) ([place]) 01/08/2021   Chemotherapy induced nausea and vomiting 01/01/2021   Goals of care, counseling/discussion 12/13/2020   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 ([place]) 12/04/2020   Encounter for preoperative dental examination 12/04/2020   Teeth missing 12/04/2020   [place] 12/04/2020   Retained tooth root 12/04/2020   Chronic apical periodontitis 12/04/2020   Impacted third molar tooth 12/04/2020   Attrition, teeth excessive 12/04/2020   Chronic periodontitis 12/04/2020   Accretions on teeth 12/04/2020   Incipient enamel caries 12/04/2020    has No Known Allergies.  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[place])    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 ([place])    Pneumothorax    Left lung spontaneous pneumothorax at age 53 yr     SURGICAL HISTORY: Past Surgical History:  Procedure Laterality Date   chest tubes Left     SOCIAL HISTORY: Social History   Socioeconomic History   Marital status: Divorced    Spouse name: Not on file   Number of children: Not on file   Years of education: Not on file   Highest education level: Not on file  Occupational History   Not on file  Tobacco Use   Smoking status: Every Day    Packs/day: 3.00    Types: Cigarettes   Smokeless tobacco: Never  Vaping Use   Vaping Use: Never used  Substance and Sexual Activity   Alcohol use: Not Currently    Alcohol/week: 12.0 standard drinks    Types: 12 Cans of beer per week   Drug use: Not Currently    Types: IV    Comment: 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)   Sexual activity: Not on file  Other Topics Concern   Not on file  Social History Narrative   Not  on file   Social Determinants of Health   Financial Resource Strain: Not on file  Food Insecurity: Not on file  Transportation Needs: Not on file  Physical Activity: Not on file  Stress: Not on file  Social Connections: Not on file  Intimate Partner Violence: Not on file    FAMILY HISTORY: History reviewed. No pertinent family history.  Review of Systems  Constitutional:  Negative for appetite change, chills, fatigue, fever and unexpected weight change.  HENT:   Positive for sore throat and trouble swallowing. Negative for hearing loss and lump/mass.   Eyes:  Negative for eye problems and icterus.  Respiratory:  Negative for chest tightness, cough and shortness of breath.   Cardiovascular:  Negative for chest pain, leg swelling and palpitations.  Gastrointestinal:  Negative for abdominal distention, abdominal pain, constipation, diarrhea, nausea and vomiting.  Endocrine: Negative for hot flashes.  Genitourinary:  Negative for difficulty urinating.   Musculoskeletal:  Negative for arthralgias.  Skin:  Negative for itching and rash.  Neurological:  Negative for dizziness, extremity weakness, headaches and numbness.  Hematological:  Negative for adenopathy. Does not bruise/bleed easily.  Psychiatric/Behavioral:  Negative for depression. The patient is not nervous/anxious.      PHYSICAL EXAMINATION  ECOG PERFORMANCE STATUS: 1 - Symptomatic but completely ambulatory  Vitals:   03/07/21 0818  BP: 111/74  Pulse: 68  Resp: 18  Temp: 97.7 F (36.5 C)  SpO2: 98%    Physical Exam Constitutional:      General: He is not in acute distress.    Appearance: Normal appearance. He is not toxic-appearing.  HENT:     Head: Normocephalic and atraumatic.  Eyes:     General: No scleral icterus. Cardiovascular:     Rate and Rhythm: Normal rate and regular rhythm.     Pulses: Normal pulses.     Heart sounds: Normal heart sounds.  Pulmonary:     Effort: Pulmonary effort is normal.      Breath sounds: Normal breath sounds.  Abdominal:     General: Abdomen is flat. Bowel sounds are normal. There is no distension.     Palpations: Abdomen is soft.     Tenderness: There is no abdominal tenderness.  Musculoskeletal:        General: No swelling.     Cervical back: Neck supple.  Lymphadenopathy:     Cervical: No cervical adenopathy.  Skin:    General: Skin is warm and dry.     Findings: No rash.  Neurological:     General: No focal deficit present.     Mental Status: He is alert.  Psychiatric:        Mood and Affect: Mood normal.        Behavior: Behavior normal.    LABORATORY DATA:  CBC    Component Value Date/Time   WBC 4.7 03/07/2021 0757  WBC 3.9 (L) 01/11/2021 0350   RBC 3.92 (L) 03/07/2021 0757   HGB 12.1 (L) 03/07/2021 0757   HCT 37.2 (L) 03/07/2021 0757   PLT 183 03/07/2021 0757   MCV 94.9 03/07/2021 0757   MCV 100.9 (A) 07/27/2011 1302   MCH 30.9 03/07/2021 0757   MCHC 32.5 03/07/2021 0757   RDW 18.4 (H) 03/07/2021 0757   LYMPHSABS 0.4 (L) 03/07/2021 0757   MONOABS 0.7 03/07/2021 0757   EOSABS 0.1 03/07/2021 0757   BASOSABS 0.0 03/07/2021 0757    CMP     Component Value Date/Time   NA 134 (L) 02/14/2021 0750   K 4.1 02/14/2021 0750   CL 97 (L) 02/14/2021 0750   CO2 29 02/14/2021 0750   GLUCOSE 104 (H) 02/14/2021 0750   BUN 15 02/14/2021 0750   CREATININE 0.78 02/14/2021 0750   CALCIUM 9.5 02/14/2021 0750   PROT 7.8 02/14/2021 0750   ALBUMIN 3.9 02/14/2021 0750   AST 23 02/14/2021 0750   ALT 16 02/14/2021 0750   ALKPHOS 56 02/14/2021 0750   BILITOT 0.5 02/14/2021 0750   GFRNONAA >60 02/14/2021 0750   GFRAA  01/25/2008 2040    >60        The eGFR has been calculated using the MDRD equation. This calculation has not been validated in all clinical       PENDING LABS:   RADIOGRAPHIC STUDIES:  No results found.   PATHOLOGY:     ASSESSMENT and THERAPY PLAN:   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  [facility]) This is a 46 year old male patient with squamous cell carcinoma of the larynx who is status post concurrent chemoradiation where he stopped chemo early due to significant throat pain and mucositis.  1.  Head neck cancer: He is status posttreatment.  Right now we are monitoring and will wait for PET scan to direct further therapy.  2.  Weight loss and deconditioning: His BMI is 15 classifying him as underweight.  He is working on eating drinking and regaining his strength.  I reviewed with him that should be his focus for the next few weeks.  3. throat pain: He is following up with palliative care today.  He continues on methadone and recently picked up his tramadol.  We will see him back in 4 weeks for labs and monitoring.   All questions were answered. The patient knows to call the clinic with any problems, questions or concerns. We can certainly see the patient much sooner if necessary.  Total encounter time: 20 minutes in face-to-face visit time, chart review, lab review, care coordination, and documentation of the encounter*  Wilber Bihari, NP 03/07/21 8:46 AM Medical Oncology and Hematology [facility] [place], [place] [address] Tel. [phone]    Fax. [phone]  *Total Encounter Time as defined by the Centers for Medicare and Medicaid Services includes, in addition to the face-to-face time of a patient visit (documented in the note above) non-face-to-face time: obtaining and reviewing outside history, ordering and reviewing medications, tests or procedures, care coordination (communications with other health care professionals or caregivers) and documentation in the medical record.

## 2021-03-08 ENCOUNTER — Telehealth: Payer: Self-pay | Admitting: Adult Health

## 2021-03-08 NOTE — Telephone Encounter (Signed)
Scheduled appointment per 2/8 los. Unable to leave message due to call not fully going through. Rang then made several beep beep noises. Patient will be mailed an updated calendar.

## 2021-03-12 ENCOUNTER — Other Ambulatory Visit: Payer: Medicaid Other

## 2021-03-19 ENCOUNTER — Ambulatory Visit: Payer: Medicaid Other

## 2021-03-19 ENCOUNTER — Other Ambulatory Visit: Payer: Medicaid Other

## 2021-03-19 ENCOUNTER — Ambulatory Visit: Payer: Medicaid Other | Admitting: Physician Assistant

## 2021-03-20 ENCOUNTER — Other Ambulatory Visit: Payer: Self-pay | Admitting: Physician Assistant

## 2021-03-20 DIAGNOSIS — C328 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: Secondary | ICD-10-CM

## 2021-03-21 ENCOUNTER — Ambulatory Visit (INDEPENDENT_AMBULATORY_CARE_PROVIDER_SITE_OTHER): Payer: Medicaid Other | Admitting: Dentistry

## 2021-03-21 ENCOUNTER — Other Ambulatory Visit: Payer: Self-pay

## 2021-03-21 VITALS — BP 113/73 | HR 75 | Temp 98.2°F | Wt 110.0 lb

## 2021-03-21 DIAGNOSIS — K117 Disturbances of salivary secretion: Secondary | ICD-10-CM

## 2021-03-21 DIAGNOSIS — K029 Dental caries, unspecified: Secondary | ICD-10-CM | POA: Diagnosis not present

## 2021-03-21 DIAGNOSIS — K036 Deposits [accretions] on teeth: Secondary | ICD-10-CM

## 2021-03-21 DIAGNOSIS — Z923 Personal history of irradiation: Secondary | ICD-10-CM | POA: Diagnosis not present

## 2021-03-21 DIAGNOSIS — R432 Parageusia: Secondary | ICD-10-CM

## 2021-03-21 DIAGNOSIS — Y842 Radiological procedure and radiotherapy as the cause of abnormal reaction of the patient, or of later complication, without mention of misadventure at the time of the procedure: Secondary | ICD-10-CM

## 2021-03-21 NOTE — Progress Notes (Signed)
Department of Dental Medicine    Service Date:   03/21/2021  Patient Name:  David [name] Date of Birth:   March 22, 1975 Medical Record Number: [id number]   LIMITED EXAM PLAN/RECOMMENDATIONS   ASSESSMENT: Dysgeusia, xerostomia, weight loss, accretions on teeth; endorses dysphagia, odynophagia  Rampant decay in all 4 quadrants  RECOMMENDATIONS: Brush after meals & at bedtime. Use fluoride at bedtime. Use trismus exercises as directed. Use CLoSYS & warm salt water/baking soda rinses as needed. Take multiple sips of water as needed.  PLAN: Follow-up as needed.  Establish care at an outside office of the patient's choice for comprehensive dental care.  Refer to [facility] for comprehensive care. Call if any questions or concerns arise.       03/21/2021    FOLLOW-UP NOTE:  HISTORY OF PRESENT ILLNESS: David [name] is a 46 y.o. male who presents today for an oral examination after radiation therapy for laryngeal cancer.   The patient has completed 35 of 35 radiation treatments from 12/18/20 to 02/09/21.  They have completed 2 chemotherapy treatments.   REVIEW OF CHIEF COMPLAINTS: DRY MOUTH:  Yes HARD TO SWALLOW:  Yes HURTS TO SWALLOW:  Yes TASTE CHANGES:  Yes, taste is slowly returning SORES IN MOUTH:  No LIMITED OPENING:  No WEIGHT LOSS:  Yes 110 lbs down from 125 lbs   AT- HOME ORAL HYGIENE REGIMEN: BRUSHING:  No, he reports he is not brushing his teeth FLOSSING:  Not flossing RINSING:   No, says he occasionally will use salt water and baking soda rinses FLUORIDE:   No, not using any fluoride treatments TRISMUS EXERCISES:  No; he has not lost any MIO. Maximum Interincisal Opening:  40 mm from ~35 mm   Patient Active Problem List   Diagnosis Date Noted   Mucositis due to antineoplastic therapy 02/14/2021   Drug abuse and dependence ([place]) 01/15/2021   Weight loss, unintentional 01/15/2021   Protein-calorie malnutrition, severe  01/10/2021   Hypomagnesemia 01/09/2021   Hypokalemia 01/09/2021   Pancytopenia ([place]) 01/09/2021   Hypotension 01/08/2021   AKI (acute kidney injury) ([place]) 01/08/2021   Hyponatremia 01/08/2021   Leukopenia due to antineoplastic chemotherapy ([place]) 01/08/2021   SIRS (systemic inflammatory response syndrome) ([place]) 01/08/2021   Chemotherapy induced nausea and vomiting 01/01/2021   Goals of care, counseling/discussion 12/13/2020   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 ([place]) 12/04/2020   Encounter for preoperative dental examination 12/04/2020   Teeth missing 12/04/2020   Caries 12/04/2020   Retained tooth root 12/04/2020   Chronic apical periodontitis 12/04/2020   Impacted third molar tooth 12/04/2020   Attrition, teeth excessive 12/04/2020   Chronic periodontitis 12/04/2020   Accretions on teeth 12/04/2020   Incipient enamel caries 12/04/2020   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 60 capsule 0   clotrimazole (MYCELEX) 10 MG troche Take 1 tablet (10 mg total) by mouth 5 (five) times daily. Suck on these slowly. 70 Troche 0   dexamethasone (DECADRON) 4 MG tablet Take 1 tablet PO BID; take with food.  40 tablet 0   dronabinol (MARINOL) 2.5 MG capsule Take 2.5 mg by mouth 2 (two) times daily before a meal.     guaiFENesin (MUCINEX) 600 MG 12 hr tablet Take 2 tablets (1,200 mg total) by mouth 2 (two) times daily. 60 tablet 1   ibuprofen (ADVIL,MOTRIN) 200 MG tablet Take 600 mg by mouth every 6 (six) hours as needed for moderate pain.     lidocaine (XYLOCAINE) 2 %  solution Patient: Mix 1part 2% viscous lidocaine, 1part H20. Swish/swallow 40mL of diluted mixture, 75min before meals and @bedtime , up to QID prn soreness 200 mL 2   methadone (DOLOPHINE) 10 MG/ML solution Take 6 mLs (60 mg total) by mouth daily. (Patient taking differently: Take 20 mg by mouth every 8 (eight) hours as needed for severe pain.) 90 mL 0   nicotine (NICODERM CQ - DOSED IN MG/24 HOURS) 14 mg/24hr patch Place 1 patch (14 mg total) onto the skin daily. Apply 21 mg patch daily x 6 wk, then 14mg  patch daily x 2 wk, then 7 mg patch daily x 2 wk 14 patch 0   nicotine (NICODERM CQ - DOSED IN MG/24 HOURS) 21 mg/24hr patch Place 1 patch (21 mg total) onto the skin daily. Apply 21 mg patch daily x 6 wk, then 14mg  patch daily x 2 wk, then 7 mg patch daily x 2 wk 14 patch 2   nicotine (NICODERM CQ - DOSED IN MG/24 HR) 7 mg/24hr patch Place 1 patch (7 mg total) onto the skin daily. Apply 21 mg patch daily x 6 wk, then 14mg  patch daily x 2 wk, then 7 mg patch daily x 2 wk 14 patch 0   omeprazole (PRILOSEC) 20 MG capsule Take 1 capsule (20 mg total) by mouth daily. 30 capsule 0   ondansetron (ZOFRAN) 8 MG tablet Take 1 tablet (8 mg total) by mouth every 8 (eight) hours as needed for nausea or vomiting. Starting day 3 after chemotherapy 30 tablet 2   prochlorperazine (COMPAZINE) 10 MG tablet Take 1 tablet (10 mg total) by mouth every 6 (six) hours as needed. (Patient taking differently: Take 10 mg by mouth every 6 (six) hours as needed for nausea or vomiting.) 30 tablet 2   sucralfate (CARAFATE) 1 g tablet Take 1 tablet (1 g total) by mouth 4 (four) times daily -  with meals and at bedtime. Crush and dissolve in 10 mL of warm water prior to swallowing, take 20 minutes before meals 120 tablet 1   traMADol (ULTRAM) 50 MG tablet Take 1 tablet (50 mg total) by mouth 2 (two) times daily. 60 tablet 0   No current facility-administered medications for this visit.   No Known Allergies   VITALS: BP 113/73 (BP  Location: Right Arm, Patient Position: Sitting, Cuff Size: Normal)   Pulse 75   Temp 98.2 F (36.8 C) (Oral)   Wt 110 lb (49.9 kg)   BMI 15.34 kg/m    DENTAL EXAM: ORAL HYGIENE (PLAQUE):  Heavy, generalized accumulation MUCOSITIS:  No DESCRIPTION OF SALIVA:  Thick, viscous saliva EXPOSED BONE:  No OTHER WATCHED AREAS:  Multiple carious lesions   ASSESSMENT/DIAGNOSES: Patient with history of radiation therapy to the head and neck region.  XEROSTOMIA DYSGEUSIA DYSPHAGIA ODYNOPHAGIA WEIGHT LOSS ACCRETIONS   PLAN AND RECOMMENDATIONS: Brush after meals and at bedtime.  Use fluoride at bedtime. Use trismus exercises as directed. Use CLoSYS as needed for dry mouth and salt water/baking soda rinses as needed. Take multiple sips of water  as needed.  Establish care at an outside office of the patient's choice.  Referral sent to [facility] for comprehensive dental care.  Explained to the patient that he needs a lot of dental work done including extractions, fillings, cleaning, etc., and [facility] would be able to provide comprehensive care as well as the possibility of taking him to the operating room to complete cleaning, extractions and fillings at one time under general anesthesia (if he is an O.R. candidate; this would also depend on medical team's recommendations and provider(s) at [facility]). His dental insurance is also accepted there. The patient verbalized understanding and is agreeable to this plan. Follow-up as needed in the hospital clinic.   Call if any problems or concerns arise.  All questions and concerns were invited and addressed.  The patient tolerated today's visit well and departed in stable condition.   Charlaine Dalton, D.M.D.

## 2021-03-21 NOTE — Patient Instructions (Signed)
[facility] Department of Dental Medicine Dr. Havish Petties B. Castella Lerner, D.M.D. Phone: [phone] Fax: [phone]      It was a pleasure seeing you today!  Please refer to the information below regarding your dental visit with us.  Please do not hesitate to give us a call if any questions or concerns come up after you leave.     Thank you for letting us provide care for you.  If there is anything we can do for you, please let us know.    RECOMMENDATIONS: Brush after meals and at bedtime.  Floss once a day, and use fluoride at bedtime. Use trismus exercises as directed below. Use CLOSYs for dry mouth, and salt water/baking soda rinses to help with any mouth sores. Take multiple sips of water as needed.  Stay hydrated. Return to your regular dentist for routine dental care including cleanings and periodic exams.  If you do not have a regular dentist, it is important to establish care at an outside dental office of your choice.  Call us if any problems or concerns arise.    TRISMUS: Trismus is a condition where the jaw does not allow the mouth to open as wide as it usually does.  This can happen almost suddenly, or in other cases the process is so slow, it is hard to notice it-until it is too far along.  When the jaw joints and/or muscles have been exposed to radiation treatments, the onset of trismus is very slow.  This is because the muscles are losing their stretching ability over a long period of time, as long as 2 YEARS after the end of radiation.  It is therefore important to exercise these muscles and joints.  Trismus exercises: Use the stack of tongue depressors measuring the same or a little less than your maximum opening that was recorded at your first dental visit. Place the stack in your mouth, supporting the other end with your hand. Allow 30 seconds for muscle stretching. Rest for a few seconds. Repeat 3-5 times. For all radiation patients,  this exercise is recommended in the mornings and evenings unless otherwise instructed. The exercises should be done for a period of 2 YEARS after the end of radiation. Maximum jaw opening should be checked routinely on recall dental visits by your general dentist. Report any changes, soreness, or difficulties encountered when doing the exercises to your dentist.    Questions?  Call our office during office hours at [phone].  

## 2021-03-23 ENCOUNTER — Other Ambulatory Visit (HOSPITAL_COMMUNITY): Payer: Medicaid Other | Admitting: Dentistry

## 2021-03-26 ENCOUNTER — Other Ambulatory Visit: Payer: Medicaid Other

## 2021-03-27 ENCOUNTER — Telehealth: Payer: Self-pay | Admitting: *Deleted

## 2021-03-27 NOTE — Telephone Encounter (Signed)
This RN spoke with the pt's mother- Nyoka Cowden- per her call stating concern because " he is hallucinating ".  She states during sleep " he is doing things with his hands- they are moving around like he is doing something and then he lets arms just hang in the air "  She says when he wakes he thinks he is seeing things like " what is that man doing in the corner there- he got paid 4 million dollars" Then when she ask further - he looks and says " he must have gone "  He is able to ambulate and do self care.  He is not combative- but is paranoid - as demonstrated as stating " hang up the phone " while his mother was talking to this RN and asking questions about his medications as well as current recreational drugs and ETOH use.  [name] is not sure what medications pt is actually using except for the methadone due to she has to drive him to obtain per the [facility].  Per discussion of prescription drugs- patient has been wearing a nicotine patch and smoking at the same time.  She states he does do recreational marijuana, when this RN asked about other drugs including heroin- she states " I do not think so- there has not been anyone here really who would give that to him" She was very affirmative in stating he has had no use of alcohol.  She states her main concern with the hallucinations is " could the cancer have gone to his brain"  Per review with Dr Chryl Heck- this RN informed [name] - concern for hallucinations is not likely to be brain metastasis but more likely interactions of medications and street drugs. This RN reviewed medications that are on his drug list with spellings that could possibly cause hallucinations of not taken properly.  This RN informed of need to remove any nicotine patches presently due to possible hallucinations caused by wearing the patch and smoking ( if she cannot stop him from smoking - patch not beneficial ).  She should monitor the best she can use of what  medications she is taking and let this office now of his status tomorrow.  Reviewed appointment scheduled on Friday with Palliative and need to bring all his medications for review for verification of use and possible side effects.  This RN also reviewed need to call EMS if his symptoms worsen- with verbalized understanding by [name] to this RN.  This note will be forwarded to providers for review of communication.

## 2021-03-28 ENCOUNTER — Ambulatory Visit: Payer: Medicaid Other

## 2021-03-28 ENCOUNTER — Ambulatory Visit: Payer: Medicaid Other | Admitting: Hematology and Oncology

## 2021-03-28 ENCOUNTER — Other Ambulatory Visit: Payer: Medicaid Other

## 2021-03-30 ENCOUNTER — Inpatient Hospital Stay: Payer: Medicaid Other | Admitting: Nurse Practitioner

## 2021-03-30 ENCOUNTER — Inpatient Hospital Stay (HOSPITAL_BASED_OUTPATIENT_CLINIC_OR_DEPARTMENT_OTHER): Payer: Medicaid Other | Admitting: Hematology and Oncology

## 2021-03-30 ENCOUNTER — Other Ambulatory Visit: Payer: Self-pay

## 2021-03-30 ENCOUNTER — Inpatient Hospital Stay: Payer: Medicaid Other | Attending: Nurse Practitioner

## 2021-03-30 ENCOUNTER — Encounter: Payer: Self-pay | Admitting: Hematology and Oncology

## 2021-03-30 DIAGNOSIS — R634 Abnormal weight loss: Secondary | ICD-10-CM | POA: Diagnosis not present

## 2021-03-30 DIAGNOSIS — F1920 Other psychoactive substance dependence, uncomplicated: Secondary | ICD-10-CM | POA: Diagnosis not present

## 2021-03-30 DIAGNOSIS — C328 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: Secondary | ICD-10-CM

## 2021-03-30 DIAGNOSIS — Z9221 Personal history of antineoplastic chemotherapy: Secondary | ICD-10-CM | POA: Insufficient documentation

## 2021-03-30 DIAGNOSIS — Z79899 Other long term (current) drug therapy: Secondary | ICD-10-CM | POA: Diagnosis not present

## 2021-03-30 DIAGNOSIS — Z1329 Encounter for screening for other suspected endocrine disorder: Secondary | ICD-10-CM

## 2021-03-30 DIAGNOSIS — Z923 Personal history of irradiation: Secondary | ICD-10-CM | POA: Diagnosis not present

## 2021-03-30 LAB — CBC WITH DIFFERENTIAL (CANCER CENTER ONLY)
Abs Immature Granulocytes: 0.04 10*3/uL (ref 0.00–0.07)
Basophils Absolute: 0.1 10*3/uL (ref 0.0–0.1)
Basophils Relative: 1 %
Eosinophils Absolute: 0.1 10*3/uL (ref 0.0–0.5)
Eosinophils Relative: 1 %
HCT: 41.7 % (ref 39.0–52.0)
Hemoglobin: 14 g/dL (ref 13.0–17.0)
Immature Granulocytes: 0 %
Lymphocytes Relative: 7 %
Lymphs Abs: 0.8 10*3/uL (ref 0.7–4.0)
MCH: 30.8 pg (ref 26.0–34.0)
MCHC: 33.6 g/dL (ref 30.0–36.0)
MCV: 91.6 fL (ref 80.0–100.0)
Monocytes Absolute: 1.3 10*3/uL — ABNORMAL HIGH (ref 0.1–1.0)
Monocytes Relative: 11 %
Neutro Abs: 9.4 10*3/uL — ABNORMAL HIGH (ref 1.7–7.7)
Neutrophils Relative %: 80 %
Platelet Count: 308 10*3/uL (ref 150–400)
RBC: 4.55 MIL/uL (ref 4.22–5.81)
RDW: 14.5 % (ref 11.5–15.5)
WBC Count: 11.7 10*3/uL — ABNORMAL HIGH (ref 4.0–10.5)
nRBC: 0 % (ref 0.0–0.2)

## 2021-03-30 LAB — CMP (CANCER CENTER ONLY)
ALT: 39 U/L (ref 0–44)
AST: 88 U/L — ABNORMAL HIGH (ref 15–41)
Albumin: 4.3 g/dL (ref 3.5–5.0)
Alkaline Phosphatase: 73 U/L (ref 38–126)
Anion gap: 9 (ref 5–15)
BUN: 26 mg/dL — ABNORMAL HIGH (ref 6–20)
CO2: 32 mmol/L (ref 22–32)
Calcium: 10.7 mg/dL — ABNORMAL HIGH (ref 8.9–10.3)
Chloride: 96 mmol/L — ABNORMAL LOW (ref 98–111)
Creatinine: 0.92 mg/dL (ref 0.61–1.24)
GFR, Estimated: >60 mL/min (ref >60)
Glucose, Bld: 106 mg/dL — ABNORMAL HIGH (ref 70–99)
Potassium: 4.7 mmol/L (ref 3.5–5.1)
Sodium: 137 mmol/L (ref 135–145)
Total Bilirubin: 0.6 mg/dL (ref 0.3–1.2)
Total Protein: 9.1 g/dL — ABNORMAL HIGH (ref 6.5–8.1)

## 2021-03-30 MED ORDER — PROCHLORPERAZINE MALEATE 10 MG PO TABS: 10.0000 mg | ORAL_TABLET | Freq: Four times a day (QID) | ORAL | 2 refills | Status: DC | PRN

## 2021-03-30 NOTE — Assessment & Plan Note (Signed)
Denies any active use of heroin at this time.  He continues to use marijuana.  Mom surrendered dronabinol saying he does not need it but it was an empty bottle. ?

## 2021-03-30 NOTE — Assessment & Plan Note (Addendum)
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  He continues to have mucositis, but improving.  Physical examination with a palpable right cervical lymphadenopathy which is consistent with partial response so far ?He is due for a PET/CT to be scheduled in April and will follow-up with Dr. Isidore Moos after to discuss imaging findings and to discuss further recommendations.  If he does not have complete response on the end of treatment.,  He understands that he may have to be referred to surgery for consideration of salvage surgery. ?

## 2021-03-30 NOTE — Progress Notes (Signed)
[facility] Follow up:   Default, Provider, MD No address on file   DIAGNOSIS:  Cancer Staging  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 [facility]) Staging form: Larynx - Supraglottis, AJCC 8th Edition - Clinical stage from 12/01/2020: Stage IVB (cT3, cN3b, cM0) - Signed by [name], MD on 12/20/2020 Stage prefix: Initial diagnosis   SUMMARY OF ONCOLOGIC HISTORY: Oncology History  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 ([place])  12/01/2020 Cancer Staging   Staging form: Larynx - Supraglottis, AJCC 8th Edition - Clinical stage from 12/01/2020: Stage IVB (cT3, cN3b, cM0) - Signed by [name], MD on 12/20/2020 Stage prefix: Initial diagnosis    12/04/2020 Initial Diagnosis   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 [facility])   12/25/2020 - 01/24/2021 Chemotherapy   Patient is on Treatment Plan : HEAD/NECK Cisplatin + XRT q21d       CURRENT THERAPY: s/p chemo and radiation  INTERVAL HISTORY: David [name] 46 y.o. male returns for evaluation of his head neck cancer.   He is here with his mom today.  Again patient is a poor historian.  Mom states that the hallucinations were transient and did not happen again.  He absolutely denies any use of heroin or other street drugs.  He has been using marijuana to stimulate his appetite and was also taking dronabinol at the same time.  He continues to smoke and was using nicotine patch at the same time.  Today he states that he continues to have the raspiness, his main issue is with the teeth, they are very sensitive and he is not able to eat.  He refused the G-tube despite recommendation multiple times.  He continues to lose weight.  He has only been drinking Ensure once a day, again cannot answer questions most of the times and jumps from topic to topic.  Mom states he has been drinking soda.  He continues to feel a mass in his right neck.  He states he is regretting that he refused the last cycle  of chemotherapy.  Rest of the pertinent 10 point ROS reviewed and negative.  Patient Active Problem List   Diagnosis Date Noted   Mucositis due to antineoplastic therapy 02/14/2021   Drug abuse and dependence ([place]) 01/15/2021   Weight loss, unintentional 01/15/2021   Protein-calorie malnutrition, severe 01/10/2021   Hypomagnesemia 01/09/2021   Hypokalemia 01/09/2021   Pancytopenia ([place]) 01/09/2021   Hypotension 01/08/2021   AKI (acute kidney injury) ([place]) 01/08/2021   Hyponatremia 01/08/2021   Leukopenia due to antineoplastic chemotherapy ([place]) 01/08/2021   SIRS (systemic inflammatory response syndrome) ([place]) 01/08/2021   Chemotherapy induced nausea and vomiting 01/01/2021   Goals of care, counseling/discussion 12/13/2020   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 ([place]) 12/04/2020   Encounter for preoperative dental examination 12/04/2020   Teeth missing 12/04/2020   Caries 12/04/2020   Retained tooth root 12/04/2020   Chronic apical periodontitis 12/04/2020   Impacted third molar tooth 12/04/2020   Attrition, teeth excessive 12/04/2020   Chronic periodontitis 12/04/2020   Accretions on teeth 12/04/2020   Incipient enamel caries 12/04/2020    has No Known Allergies.  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[place])    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 ([place])    Pneumothorax    Left lung spontaneous pneumothorax at age 23 yr     SURGICAL HISTORY: Past Surgical History:  Procedure Laterality Date   chest tubes Left  SOCIAL HISTORY: Social History   Socioeconomic History   Marital status: Divorced    Spouse name: Not on file   Number of children: Not on file   Years of education: Not on file   Highest education level: Not on file  Occupational History   Not on file  Tobacco Use   Smoking status: Every Day    Packs/day: 3.00    Types: Cigarettes   Smokeless tobacco: Never  Vaping Use   Vaping Use:  Never used  Substance and Sexual Activity   Alcohol use: Not Currently    Alcohol/week: 12.0 standard drinks    Types: 12 Cans of beer per week   Drug use: Not Currently    Types: IV    Comment: 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)   Sexual activity: Not on file  Other Topics Concern   Not on file  Social History Narrative   Not on file   Social Determinants of Health   Financial Resource Strain: Not on file  Food Insecurity: Not on file  Transportation Needs: Not on file  Physical Activity: Not on file  Stress: Not on file  Social Connections: Not on file  Intimate Partner Violence: Not on file    FAMILY HISTORY: History reviewed. No pertinent family history.  Review of Systems  Constitutional:  Negative for appetite change, chills, fatigue, fever and unexpected weight change.  HENT:   Positive for sore throat and trouble swallowing. Negative for hearing loss and lump/mass.   Eyes:  Negative for eye problems and icterus.  Respiratory:  Negative for chest tightness, cough and shortness of breath.   Cardiovascular:  Negative for chest pain, leg swelling and palpitations.  Gastrointestinal:  Negative for abdominal distention, abdominal pain, constipation, diarrhea, nausea and vomiting.  Endocrine: Negative for hot flashes.  Genitourinary:  Negative for difficulty urinating.   Musculoskeletal:  Negative for arthralgias.  Skin:  Negative for itching and rash.  Neurological:  Negative for dizziness, extremity weakness, headaches and numbness.  Hematological:  Negative for adenopathy. Does not bruise/bleed easily.  Psychiatric/Behavioral:  Negative for decreased concentration and depression. The patient is not nervous/anxious.      PHYSICAL EXAMINATION  ECOG PERFORMANCE STATUS: 1 - Symptomatic but completely ambulatory  Vitals:   03/30/21 0912  BP: (!) 132/93  Pulse: (!) 105  Resp: 16  Temp: 97.7 F (36.5 C)  SpO2: 96%    Physical  Exam Constitutional:      General: He is not in acute distress.    Appearance: Normal appearance. He is not toxic-appearing.  HENT:     Head: Normocephalic and atraumatic.     Mouth/Throat:     Mouth: Mucous membranes are dry.  Eyes:     General: No scleral icterus. Cardiovascular:     Rate and Rhythm: Normal rate and regular rhythm.     Pulses: Normal pulses.     Heart sounds: Normal heart sounds.  Pulmonary:     Effort: Pulmonary effort is normal.     Breath sounds: Normal breath sounds.  Abdominal:     General: Abdomen is flat. Bowel sounds are normal.     Palpations: Abdomen is soft.  Musculoskeletal:        General: No swelling.     Cervical back: Neck supple.  Lymphadenopathy:     Cervical: Cervical adenopathy (palpable right cervical LN) present.  Skin:    General: Skin is warm and dry.  Neurological:     General: No  focal deficit present.     Mental Status: He is alert.  Psychiatric:     Comments: Poor concentration    LABORATORY DATA:  CBC    Component Value Date/Time   WBC 11.7 (H) 03/30/2021 0856   WBC 3.9 (L) 01/11/2021 0350   RBC 4.55 03/30/2021 0856   HGB 14.0 03/30/2021 0856   HCT 41.7 03/30/2021 0856   PLT 308 03/30/2021 0856   MCV 91.6 03/30/2021 0856   MCV 100.9 (A) 07/27/2011 1302   MCH 30.8 03/30/2021 0856   MCHC 33.6 03/30/2021 0856   RDW 14.5 03/30/2021 0856   LYMPHSABS 0.8 03/30/2021 0856   MONOABS 1.3 (H) 03/30/2021 0856   EOSABS 0.1 03/30/2021 0856   BASOSABS 0.1 03/30/2021 0856    CMP     Component Value Date/Time   NA 137 03/30/2021 0856   K 4.7 03/30/2021 0856   CL 96 (L) 03/30/2021 0856   CO2 32 03/30/2021 0856   GLUCOSE 106 (H) 03/30/2021 0856   BUN 26 (H) 03/30/2021 0856   CREATININE 0.92 03/30/2021 0856   CALCIUM 10.7 (H) 03/30/2021 0856   PROT 9.1 (H) 03/30/2021 0856   ALBUMIN 4.3 03/30/2021 0856   AST 88 (H) 03/30/2021 0856   ALT 39 03/30/2021 0856   ALKPHOS 73 03/30/2021 0856   BILITOT 0.6 03/30/2021 0856    GFRNONAA >60 03/30/2021 0856   GFRAA  01/25/2008 2040    >60        The eGFR has been calculated using the MDRD equation. This calculation has not been validated in all clinical    PENDING LABS: CBC reviewed, mild leukocytosis, otherwise no evidence of anemia or thrombocytopenia. CMP reviewed.   RADIOGRAPHIC STUDIES:  No results found.   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  He continues to have mucositis, but improving.  Physical examination with a palpable right cervical lymphadenopathy which is consistent with partial response so far He is due for a PET/CT to be scheduled in April and will follow-up with Dr. Isidore Moos after to discuss imaging findings and to discuss further recommendations.  Weight loss, unintentional He has lost about 4 pounds since his last visit.  He refused G-tube despite multiple discussions.  Again he is very hard to deliver instructions to at times.  We encouraged him to try eat and use energy drinks as much as possible, he states he is limited because of his poor dentition.  He understands it does not recommended to lose weight drastically which will slow down recovery.  He can also follow-up with nutrition for further recommendations.  Drug abuse and dependence [facility]) Denies any active use of heroin at this time.  He continues to use marijuana.  Mom surrendered dronabinol saying he does not need it but it was an empty bottle.   All questions were answered. The patient knows to call the clinic with any problems, questions or concerns. We can certainly see the patient much sooner if necessary.  Total encounter time: 30 minutes in face-to-face visit time,  chart review, lab review, care coordination, and documentation of the encounter*  *Total Encounter Time as defined by the Centers for Medicare and Medicaid Services includes, in addition to the face-to-face  time of a patient visit (documented in the note above) non-face-to-face time: obtaining and reviewing outside history, ordering and reviewing medications, tests or procedures, care coordination (communications with other health care professionals or caregivers) and documentation in the medical record.

## 2021-03-30 NOTE — Assessment & Plan Note (Signed)
He has lost about 4 pounds since his last visit.  He refused G-tube despite multiple discussions.  Again he is very hard to deliver instructions to at times.  ?We encouraged him to try eat and use energy drinks as much as possible, he states he is limited because of his poor dentition.  He understands it does not recommended to lose weight drastically which will slow down recovery.  He can also follow-up with nutrition for further recommendations. ?

## 2021-03-31 ENCOUNTER — Other Ambulatory Visit: Payer: Self-pay

## 2021-03-31 ENCOUNTER — Emergency Department (HOSPITAL_COMMUNITY): Payer: Medicaid Other

## 2021-03-31 ENCOUNTER — Observation Stay (HOSPITAL_COMMUNITY)
Admission: EM | Admit: 2021-03-31 | Discharge: 2021-04-01 | DRG: 871 | Discharge status: Against Medical Advice | Payer: Medicaid Other | Attending: Internal Medicine | Admitting: Internal Medicine

## 2021-03-31 ENCOUNTER — Encounter (HOSPITAL_COMMUNITY): Payer: Self-pay

## 2021-03-31 DIAGNOSIS — I9589 Other hypotension: Secondary | ICD-10-CM | POA: Diagnosis not present

## 2021-03-31 DIAGNOSIS — E876 Hypokalemia: Secondary | ICD-10-CM | POA: Diagnosis not present

## 2021-03-31 DIAGNOSIS — R778 Other specified abnormalities of plasma proteins: Secondary | ICD-10-CM | POA: Diagnosis present

## 2021-03-31 DIAGNOSIS — R0602 Shortness of breath: Secondary | ICD-10-CM

## 2021-03-31 DIAGNOSIS — A419 Sepsis, unspecified organism: Secondary | ICD-10-CM | POA: Diagnosis present

## 2021-03-31 DIAGNOSIS — R061 Stridor: Secondary | ICD-10-CM | POA: Diagnosis present

## 2021-03-31 DIAGNOSIS — R64 Cachexia: Secondary | ICD-10-CM | POA: Diagnosis not present

## 2021-03-31 DIAGNOSIS — J13 Pneumonia due to Streptococcus pneumoniae: Secondary | ICD-10-CM | POA: Diagnosis present

## 2021-03-31 DIAGNOSIS — F1120 Opioid dependence, uncomplicated: Secondary | ICD-10-CM | POA: Diagnosis present

## 2021-03-31 DIAGNOSIS — E872 Acidosis, unspecified: Secondary | ICD-10-CM | POA: Diagnosis not present

## 2021-03-31 DIAGNOSIS — C329 Malignant neoplasm of larynx, unspecified: Secondary | ICD-10-CM

## 2021-03-31 DIAGNOSIS — R0603 Acute respiratory distress: Secondary | ICD-10-CM | POA: Diagnosis present

## 2021-03-31 DIAGNOSIS — R490 Dysphonia: Secondary | ICD-10-CM | POA: Diagnosis not present

## 2021-03-31 DIAGNOSIS — Z5329 Procedure and treatment not carried out because of patient's decision for other reasons: Secondary | ICD-10-CM | POA: Diagnosis present

## 2021-03-31 DIAGNOSIS — J189 Pneumonia, unspecified organism: Secondary | ICD-10-CM | POA: Diagnosis not present

## 2021-03-31 DIAGNOSIS — C799 Secondary malignant neoplasm of unspecified site: Secondary | ICD-10-CM | POA: Diagnosis not present

## 2021-03-31 DIAGNOSIS — I700 Atherosclerosis of aorta: Secondary | ICD-10-CM | POA: Diagnosis present

## 2021-03-31 DIAGNOSIS — Z681 Body mass index (BMI) 19 or less, adult: Secondary | ICD-10-CM

## 2021-03-31 DIAGNOSIS — Z79899 Other long term (current) drug therapy: Secondary | ICD-10-CM | POA: Diagnosis not present

## 2021-03-31 DIAGNOSIS — F410 Panic disorder [episodic paroxysmal anxiety] without agoraphobia: Secondary | ICD-10-CM | POA: Diagnosis present

## 2021-03-31 DIAGNOSIS — E43 Unspecified severe protein-calorie malnutrition: Secondary | ICD-10-CM | POA: Diagnosis not present

## 2021-03-31 DIAGNOSIS — R7401 Elevation of levels of liver transaminase levels: Secondary | ICD-10-CM | POA: Diagnosis not present

## 2021-03-31 DIAGNOSIS — E46 Unspecified protein-calorie malnutrition: Secondary | ICD-10-CM | POA: Diagnosis not present

## 2021-03-31 DIAGNOSIS — Z20822 Contact with and (suspected) exposure to covid-19: Secondary | ICD-10-CM | POA: Diagnosis not present

## 2021-03-31 DIAGNOSIS — I21A1 Myocardial infarction type 2: Secondary | ICD-10-CM | POA: Diagnosis present

## 2021-03-31 DIAGNOSIS — C328 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: Secondary | ICD-10-CM | POA: Diagnosis not present

## 2021-03-31 DIAGNOSIS — E871 Hypo-osmolality and hyponatremia: Secondary | ICD-10-CM | POA: Diagnosis present

## 2021-03-31 DIAGNOSIS — E860 Dehydration: Secondary | ICD-10-CM | POA: Diagnosis not present

## 2021-03-31 DIAGNOSIS — R7989 Other specified abnormal findings of blood chemistry: Secondary | ICD-10-CM | POA: Diagnosis present

## 2021-03-31 DIAGNOSIS — F17210 Nicotine dependence, cigarettes, uncomplicated: Secondary | ICD-10-CM | POA: Diagnosis not present

## 2021-03-31 LAB — CBC WITH DIFFERENTIAL/PLATELET
Abs Immature Granulocytes: 0.16 10*3/uL — ABNORMAL HIGH (ref 0.00–0.07)
Basophils Absolute: 0.1 10*3/uL (ref 0.0–0.1)
Basophils Relative: 0 %
Eosinophils Absolute: 0 10*3/uL (ref 0.0–0.5)
Eosinophils Relative: 0 %
HCT: 41 % (ref 39.0–52.0)
Hemoglobin: 13.7 g/dL (ref 13.0–17.0)
Immature Granulocytes: 1 %
Lymphocytes Relative: 1 %
Lymphs Abs: 0.3 10*3/uL — ABNORMAL LOW (ref 0.7–4.0)
MCH: 30.7 pg (ref 26.0–34.0)
MCHC: 33.4 g/dL (ref 30.0–36.0)
MCV: 91.9 fL (ref 80.0–100.0)
Monocytes Absolute: 1.2 10*3/uL — ABNORMAL HIGH (ref 0.1–1.0)
Monocytes Relative: 5 %
Neutro Abs: 22.6 10*3/uL — ABNORMAL HIGH (ref 1.7–7.7)
Neutrophils Relative %: 93 %
Platelets: 297 10*3/uL (ref 150–400)
RBC: 4.46 MIL/uL (ref 4.22–5.81)
RDW: 14.1 % (ref 11.5–15.5)
WBC: 24.4 10*3/uL — ABNORMAL HIGH (ref 4.0–10.5)
nRBC: 0 % (ref 0.0–0.2)

## 2021-03-31 LAB — URINALYSIS, ROUTINE W REFLEX MICROSCOPIC
Bacteria, UA: NONE SEEN
Bilirubin Urine: NEGATIVE
Glucose, UA: NEGATIVE mg/dL
Ketones, ur: 5 mg/dL — AB
Nitrite: NEGATIVE
Protein, ur: 100 mg/dL — AB
Specific Gravity, Urine: 1.024 (ref 1.005–1.030)
pH: 5 (ref 5.0–8.0)

## 2021-03-31 LAB — COMPREHENSIVE METABOLIC PANEL
ALT: 52 U/L — ABNORMAL HIGH (ref 0–44)
AST: 173 U/L — ABNORMAL HIGH (ref 15–41)
Albumin: 4 g/dL (ref 3.5–5.0)
Alkaline Phosphatase: 66 U/L (ref 38–126)
Anion gap: 9 (ref 5–15)
BUN: 19 mg/dL (ref 6–20)
CO2: 28 mmol/L (ref 22–32)
Calcium: 9.1 mg/dL (ref 8.9–10.3)
Chloride: 93 mmol/L — ABNORMAL LOW (ref 98–111)
Creatinine, Ser: 0.63 mg/dL (ref 0.61–1.24)
GFR, Estimated: >60 mL/min (ref >60)
Glucose, Bld: 88 mg/dL (ref 70–99)
Potassium: 3.2 mmol/L — ABNORMAL LOW (ref 3.5–5.1)
Sodium: 130 mmol/L — ABNORMAL LOW (ref 135–145)
Total Bilirubin: 0.7 mg/dL (ref 0.3–1.2)
Total Protein: 9 g/dL — ABNORMAL HIGH (ref 6.5–8.1)

## 2021-03-31 LAB — RESP PANEL BY RT-PCR (FLU A&B, COVID) ARPGX2
Influenza A by PCR: NEGATIVE
Influenza B by PCR: NEGATIVE
SARS Coronavirus 2 by RT PCR: NEGATIVE

## 2021-03-31 LAB — LACTIC ACID, PLASMA: Lactic Acid, Venous: 2.4 mmol/L (ref 0.5–1.9)

## 2021-03-31 LAB — PROTIME-INR
INR: 1.2 (ref 0.8–1.2)
Prothrombin Time: 14.7 seconds (ref 11.4–15.2)

## 2021-03-31 LAB — APTT: aPTT: 33 seconds (ref 24–36)

## 2021-03-31 LAB — TROPONIN I (HIGH SENSITIVITY): Troponin I (High Sensitivity): 897 ng/L (ref <18)

## 2021-03-31 MED ORDER — LACTATED RINGERS IV BOLUS
500.0000 mL | Freq: Once | INTRAVENOUS | Status: AC
  Administered 2021-03-31: 500 mL via INTRAVENOUS

## 2021-03-31 MED ORDER — LACTATED RINGERS IV BOLUS (SEPSIS)
500.0000 mL | Freq: Once | INTRAVENOUS | Status: AC
  Administered 2021-03-31: 500 mL via INTRAVENOUS

## 2021-03-31 MED ORDER — DEXAMETHASONE SODIUM PHOSPHATE 10 MG/ML IJ SOLN
10.0000 mg | Freq: Once | INTRAMUSCULAR | Status: AC
  Administered 2021-03-31: 10 mg via INTRAVENOUS
  Filled 2021-03-31: qty 1

## 2021-03-31 MED ORDER — SODIUM CHLORIDE 0.9 % IV SOLN
1.0000 g | Freq: Once | INTRAVENOUS | Status: AC
  Administered 2021-03-31: 1 g via INTRAVENOUS
  Filled 2021-03-31: qty 10

## 2021-03-31 MED ORDER — POTASSIUM CHLORIDE 10 MEQ/100ML IV SOLN
10.0000 meq | INTRAVENOUS | Status: AC
  Administered 2021-03-31 – 2021-04-01 (×2): 10 meq via INTRAVENOUS
  Filled 2021-03-31 (×2): qty 100

## 2021-03-31 MED ORDER — IOHEXOL 300 MG/ML  SOLN
75.0000 mL | Freq: Once | INTRAMUSCULAR | Status: AC | PRN
  Administered 2021-03-31: 75 mL via INTRAVENOUS

## 2021-03-31 MED ORDER — LACTATED RINGERS IV SOLN: INTRAVENOUS | Status: DC

## 2021-03-31 MED ORDER — SODIUM CHLORIDE 0.9 % IV SOLN
500.0000 mg | Freq: Once | INTRAVENOUS | Status: AC
  Administered 2021-03-31: 500 mg via INTRAVENOUS
  Filled 2021-03-31: qty 5

## 2021-03-31 NOTE — ED Triage Notes (Signed)
Pt. [name] c/o anxiety with SOB. EMS states that pt. Mother was watching him sleep and woke up having a panic attack because of it. Pt. Has a hx of stage 4 throat cancer and has a raspy voice because of it. Pt. Given 2L/min O2 via [place] for comfort. ? ?EMS VS: ?BP: 124/79 ?HR: 120 ?RR: 24 ?O2: 100% RA ? ?

## 2021-03-31 NOTE — ED Provider Notes (Signed)
[facility] DEPT Provider Note   CSN: [id number] Arrival date & time: 03/31/21  1935     History  Chief Complaint  Patient presents with   Respiratory Distress    David [name] is a 46 y.o. 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  Patient states that he was sleeping and when he woke up he found his mom to be staring at him and startled him and he became very anxious and was feeling very short of breath. Of note patient does continue to smoke and use nicotine patches daily.  He has completed course of cisplatin and radiation with last radiation treatment back in January according to the patient.  He states that at baseline he has some stridorous breathing but his shortness of breath has worsened in the last week.  He is not typically on oxygen but is currently on 2 L for comfort via EMS.  Patient cachectic, having refused G-tube in the past.   Level 5 caveat due to patient's acuity upon presentation.  I personally reviewed this patient's medical records.  In addition to his above listed malignancy he has history of polysubstance abuse in the past as well as malnutrition.  He is on dronabinol and smokes marijuana regularly reportedly for diet.  HPI     Home Medications Prior to Admission medications   Medication Sig Start Date End Date Taking? Authorizing Provider  clotrimazole (MYCELEX) 10 MG troche Take 1 tablet (10 mg total) by mouth 5 (five) times daily. Suck on these slowly. 02/26/21  Yes [name], MD  guaiFENesin (MUCINEX) 600 MG 12 hr tablet Take 2 tablets (1,200 mg total) by mouth 2 (two) times daily. 02/26/21  Yes Pickenpack-Cousar, Carlena Sax, NP  methadone (DOLOPHINE) 10 MG/ML solution Take 6 mLs (60 mg total) by mouth daily. 01/08/21  Yes Pickenpack-Cousar, Carlena Sax, NP  ondansetron (ZOFRAN) 8 MG tablet Take 1 tablet (8 mg  total) by mouth every 8 (eight) hours as needed for nausea or vomiting. Starting day 3 after chemotherapy 01/01/21  Yes Heilingoetter, Cassandra L, PA-C  prochlorperazine (COMPAZINE) 10 MG tablet Take 1 tablet (10 mg total) by mouth every 6 (six) hours as needed. Patient taking differently: Take 10 mg by mouth every 6 (six) hours as needed for nausea. 03/30/21  Yes [name], MD  benzonatate (TESSALON) 100 MG capsule Take 1 capsule (100 mg total) by mouth 3 (three) times daily as needed for cough. Patient not taking: Reported on 03/31/2021 02/26/21   Pickenpack-Cousar, Carlena Sax, NP  dexamethasone (DECADRON) 4 MG tablet Take 1 tablet PO BID; take with food. 12/01/20   [name], MD  dronabinol (MARINOL) 2.5 MG capsule Take 2.5 mg by mouth 2 (two) times daily before a meal. Patient not taking: Reported on 03/31/2021    [provider]  lidocaine (XYLOCAINE) 2 % solution Patient: Mix 1part 2% viscous lidocaine, 1part H20. Swish/swallow 26m of diluted mixture, 334m before meals and '@bedtime'$ , up to QID prn soreness 02/26/21   SqEppie GibsonMD  nicotine (NICODERM CQ - DOSED IN MG/24 HOURS) 14 mg/24hr patch Place 1 patch (14 mg total) onto the skin daily. Apply 21 mg patch daily x 6 wk, then '14mg'$  patch daily x 2 wk, then 7 mg patch daily x 2 wk 02/26/21   SqEppie GibsonMD  nicotine (NICODERM CQ - DOSED IN MG/24 HOURS) 21 mg/24hr patch Place 1 patch (21 mg  total) onto the skin daily. Apply 21 mg patch daily x 6 wk, then '14mg'$  patch daily x 2 wk, then 7 mg patch daily x 2 wk 02/26/21   [name], MD  nicotine (NICODERM CQ - DOSED IN MG/24 HR) 7 mg/24hr patch Place 1 patch (7 mg total) onto the skin daily. Apply 21 mg patch daily x 6 wk, then '14mg'$  patch daily x 2 wk, then 7 mg patch daily x 2 wk 02/26/21   [name], MD  omeprazole (PRILOSEC) 20 MG capsule Take 1 capsule (20 mg total) by mouth daily. 12/01/20   [name], MD  sucralfate (CARAFATE) 1 g tablet Take 1 tablet (1 g total) by mouth  4 (four) times daily -  with meals and at bedtime. Crush and dissolve in 10 mL of warm water prior to swallowing, take 20 minutes before meals 02/15/21   Gery Pray, MD  traMADol (ULTRAM) 50 MG tablet Take 1 tablet (50 mg total) by mouth 2 (two) times daily. 02/26/21   Pickenpack-Cousar, Carlena Sax, NP      Allergies    Patient has no known allergies.    Review of Systems   Review of Systems  Constitutional:  Positive for activity change and fatigue.  HENT:  Positive for trouble swallowing.   Respiratory:  Positive for shortness of breath and stridor.   Cardiovascular: Negative.   Gastrointestinal: Negative.   Genitourinary: Negative.   Psychiatric/Behavioral:  The patient is nervous/anxious.    Physical Exam Updated Vital Signs BP 111/73    Pulse 95    Temp 99.2 F (37.3 C) (Oral)    Resp (!) 25    Ht '5\' 11"'$  (1.803 m)    Wt 47.2 kg    SpO2 93%    BMI 14.51 kg/m  Physical Exam Vitals and nursing note reviewed.  Constitutional:      Appearance: He is cachectic. He is toxic-appearing.     Interventions: Nasal cannula in place.  HENT:     Head: Normocephalic and atraumatic.     Nose: Nose normal.     Mouth/Throat:     Mouth: Mucous membranes are moist.     Pharynx: Oropharynx is clear. No oropharyngeal exudate or posterior oropharyngeal erythema.  Eyes:     General: Lids are normal. Vision grossly intact.        Right eye: No discharge.        Left eye: No discharge.     Extraocular Movements: Extraocular movements intact.     Conjunctiva/sclera: Conjunctivae normal.     Pupils: Pupils are equal, round, and reactive to light.  Neck:      Comments: Hoarseness of voice, per patient at his baseline Cardiovascular:     Rate and Rhythm: Regular rhythm. Tachycardia present.     Pulses: Normal pulses.     Heart sounds: Normal heart sounds. No murmur heard. Pulmonary:     Effort: Tachypnea, accessory muscle usage and prolonged expiration present. No respiratory distress.      Breath sounds: Stridor present. Examination of the right-middle field reveals wheezing. Examination of the left-lower field reveals wheezing. Wheezing present. No rales.     Comments: Stridor at rest, per patient increased from his baseline.  Tachypneic, accessory muscle use.  Patient becomes tachycardic to the 130s with any speech.  On 2 L supplemental oxygen nasal cannula for comfort; monitored off of supplemental oxygen with maintenance of a saturation above 95% on room air. Chest:     Chest wall:  No mass, lacerations, deformity, swelling, tenderness, crepitus or edema.  Abdominal:     General: Bowel sounds are normal. There is no distension.     Palpations: Abdomen is soft.     Tenderness: There is no abdominal tenderness. There is no right CVA tenderness, left CVA tenderness, guarding or rebound.  Musculoskeletal:        General: No deformity.     Cervical back: Normal range of motion and neck supple.     Right lower leg: No edema.     Left lower leg: No edema.  Lymphadenopathy:     Cervical: No cervical adenopathy.  Skin:    General: Skin is warm and dry.     Capillary Refill: Capillary refill takes less than 2 seconds.  Neurological:     General: No focal deficit present.     Mental Status: He is alert and oriented to person, place, and time. Mental status is at baseline.     GCS: GCS eye subscore is 4. GCS verbal subscore is 5. GCS motor subscore is 6.  Psychiatric:        Mood and Affect: Mood is anxious.     Comments: Repeatedly states he does not want to die.     ED Results / Procedures / Treatments   Labs (all labs ordered are listed, but only abnormal results are displayed) Labs Reviewed  CBC WITH DIFFERENTIAL/PLATELET - Abnormal; Notable for the following components:      Result Value   WBC 24.4 (*)    Neutro Abs 22.6 (*)    Lymphs Abs 0.3 (*)    Monocytes Absolute 1.2 (*)    Abs Immature Granulocytes 0.16 (*)    All other components within normal limits  LACTIC  ACID, PLASMA - Abnormal; Notable for the following components:   Lactic Acid, Venous 2.4 (*)    All other components within normal limits  URINALYSIS, ROUTINE W REFLEX MICROSCOPIC - Abnormal; Notable for the following components:   APPearance HAZY (*)    Hgb urine dipstick MODERATE (*)    Ketones, ur 5 (*)    Protein, ur 100 (*)    Leukocytes,Ua TRACE (*)    All other components within normal limits  COMPREHENSIVE METABOLIC PANEL - Abnormal; Notable for the following components:   Sodium 130 (*)    Potassium 3.2 (*)    Chloride 93 (*)    Total Protein 9.0 (*)    AST 173 (*)    ALT 52 (*)    All other components within normal limits  TROPONIN I (HIGH SENSITIVITY) - Abnormal; Notable for the following components:   Troponin I (High Sensitivity) 897 (*)    All other components within normal limits  RESP PANEL BY RT-PCR (FLU A&B, COVID) ARPGX2  CULTURE, BLOOD (ROUTINE X 2)  CULTURE, BLOOD (ROUTINE X 2)  PROTIME-INR  APTT  LACTIC ACID, PLASMA  CBC WITH DIFFERENTIAL/PLATELET  TROPONIN I (HIGH SENSITIVITY)    EKG EKG Interpretation  Date/Time:  Saturday March 31 2021 19:44:52 EST Ventricular Rate:  114 PR Interval:  133 QRS Duration: 99 QT Interval:  387 QTC Calculation: 533 R Axis:   47 Text Interpretation: Sinus tachycardia Probable anteroseptal infarct, recent Prolonged QT interval When compared to prior, similar appearance with fast rate. No STEMI Confirmed by Antony Blackbird [phone]) on 03/31/2021 10:47:39 PM  Radiology CT Soft Tissue Neck W Contrast  Result Date: 03/31/2021 CLINICAL DATA:  Stage IV laryngeal cancer.  Stridor. EXAM: CT NECK WITH CONTRAST TECHNIQUE:  Multidetector CT imaging of the neck was performed using the standard protocol following the bolus administration of intravenous contrast. RADIATION DOSE REDUCTION: This exam was performed according to the departmental dose-optimization program which includes automated exposure control, adjustment of the mA and/or kV  according to patient size and/or use of iterative reconstruction technique. CONTRAST:  72m OMNIPAQUE IOHEXOL 300 MG/ML  SOLN COMPARISON:  CT neck 11/20/2020 FINDINGS: Examination is severely degraded by motion. Pharynx and larynx: There is a lobulated mass of the larynx, predominantly right-sided. This is largely obscured by motion but appears unchanged. The airway is widely patent. Salivary glands: No inflammation, mass, or stone. Thyroid: Normal. Lymph nodes: Partially necrotic mass of the right neck has slightly decreased in size from 3.2 cm 2 2.3 cm. No other lymph nodes are visualized. Vascular: Calcific aortic atherosclerosis. Limited intracranial: Negative. Visualized orbits: Negative. Mastoids and visualized paranasal sinuses: Clear. Skeleton: No acute or aggressive process. Upper chest: Hazy opacity at the right lung apex, favored to be scarring. Other: None. IMPRESSION: 1. Severely motion degraded study.  No airway compromise. 2. Unchanged appearance of lobulated mass of the larynx, within limitations imposed by the degree of motion. 3. Slight decrease in size of partially necrotic mass of the right neck. No other lymph nodes are visualized. Aortic Atherosclerosis (ICD10-I70.0). Electronically Signed   By: KUlyses JarredM.D.   On: 03/31/2021 22:09   DG Chest Port 1 View  Result Date: 03/31/2021 CLINICAL DATA:  Shortness of breath. EXAM: PORTABLE CHEST 1 VIEW COMPARISON:  Chest x-ray with ribs 02/18/2019. FINDINGS: There is some hazy and patchy opacities in the right middle lobe. Left lung is clear. Staple line is again noted in the medial left upper lung. No pleural effusion or pneumothorax. Cardiomediastinal silhouette is within normal limits. IMPRESSION: Hazy and patchy opacities in the right middle lobe worrisome for infection. Followup PA and lateral chest X-ray is recommended in 3-4 weeks following trial of antibiotic therapy to ensure resolution and exclude underlying malignancy. Electronically  Signed   By: ARonney AstersM.D.   On: 03/31/2021 20:57    Procedures .Critical Care Performed by: SEmeline Darling PA-C Authorized by: SEmeline Darling PA-C   Critical care provider statement:    Critical care time (minutes):  45   Critical care was necessary to treat or prevent imminent or life-threatening deterioration of the following conditions:  Sepsis   Critical care was time spent personally by me on the following activities:  Development of treatment plan with patient or surrogate, discussions with consultants, evaluation of patient's response to treatment, examination of patient, obtaining history from patient or surrogate, ordering and performing treatments and interventions, ordering and review of laboratory studies, ordering and review of radiographic studies, pulse oximetry and re-evaluation of patient's condition    Medications Ordered in ED Medications  lactated ringers infusion ( Intravenous New Bag/Given 03/31/21 2322)  lactated ringers bolus 500 mL (500 mLs Intravenous New Bag/Given 03/31/21 2313)  lactated ringers bolus 500 mL (500 mLs Intravenous New Bag/Given 03/31/21 2351)  potassium chloride 10 mEq in 100 mL IVPB (10 mEq Intravenous New Bag/Given 03/31/21 2350)  dexamethasone (DECADRON) injection 10 mg (10 mg Intravenous Given 03/31/21 2114)  cefTRIAXone (ROCEPHIN) 1 g in sodium chloride 0.9 % 100 mL IVPB (0 g Intravenous Stopped 03/31/21 2211)  azithromycin (ZITHROMAX) 500 mg in sodium chloride 0.9 % 250 mL IVPB (0 mg Intravenous Stopped 03/31/21 2314)  lactated ringers bolus 500 mL (500 mLs Intravenous New Bag/Given 03/31/21 2211)  iohexol (  OMNIPAQUE) 300 MG/ML solution 75 mL (75 mLs Intravenous Contrast Given 03/31/21 2135)    ED Course/ Medical Decision Making/ A&P Clinical Course as of 03/31/21 2357  Sat Mar 31, 2021  2256 Consult to cardiologist Dr. Shirlee Latch who suspects demand ischemia from septic presentation with concomitant severe laryngeal malignancy.  He  recommends that we trend troponin, hold heparin unless significant increase in delta troponin.  He is also requesting an echocardiogram.  Should the troponin increase significantly or echocardiogram revealed severe wall motion abnormalities he recommends patient be transferred [facility].  Appreciate his collaboration in the care of this patient.  [RS]  2322 Consult  [RS]    Clinical Course User Index [RS] Keean Wilmeth, Gypsy Balsam, PA-C                           Medical Decision Making 46 year old male with advanced malignancy who presents with concern for SOB.   Tachycardic and tachypneic on arrival.  Vital signs otherwise normal.  Cardiac exam is normal, pulmonary exam with tachypnea, without wheezing.  Patient does have stridor at rest.  Significant tachycardia with speech with increase in heart rate to the 130s.  Patient with hoarseness of voice apparently at baseline.  Abdominal exam is benign.  Patient without swelling in the lower extremities.  Sepsis work-up initiated.  Amount and/or Complexity of Data Reviewed Labs: ordered.    Details: CBC with significant leukocytosis of 24,000 with left shift without anemia.  CMP with hyponatremia to 130, hypokalemia to 3.2, transaminitis with AST/ALT 173/52.  Coags are normal.  Lactic acid elevated to 2.4.  Troponin significant elevated to 897.  UA with ketonuria and proteinuria, hemoglobinuria.  RVP negative. Radiology: ordered.    Details: Chest x-ray with apparent right middle lobe pneumonia.  CT of the neck severely motion degraded but no apparent acute airway compromise.  Images visualized by this provider. ECG/medicine tests: ordered.    Details: EKG on intake without STEMI.  Risk Prescription drug management. Decision regarding hospitalization.    Patient meeting sepsis criteria after laboratory studies and imaging returned.  Code sepsis activated.  Patient receiving IV antibiotics for as well as 30 cc/kg fluid bolus.  He remains  mildly stridorous at rest though this is improving after administration of Decadron.  Repeat EKG without STEMI obtained at time of perception of significant elevated troponin.  Consult to cardiology as above.  Given concerning presentation patient certainly will require admission to the hospital for further stabilization and management.  Clent voiced understanding of medical evaluation and treatment plan thus far.  Each of his questions answered to his expressed satisfaction.  He is amenable to plan for admission at this time.  Consults to hospitalist as above; patient evaluated at the bedside by attending physician as well.   Comorbidities that complicated the evaluation and management of this patient include but are not limited to his polysubstance abuse as well as his metastatic malignancy.  Patient current every day smoker with concomitant use of nicotine patch, former IV drug abuser, lives with his mother.  This chart was dictated using voice recognition software, Dragon. Despite the best efforts of this provider to proofread and correct errors, errors may still occur which can change documentation meaning.   Final Clinical Impression(s) / ED Diagnoses Final diagnoses:  None    Rx / DC Orders ED Discharge Orders     None         Lorre Opdahl, Eugene Garnet  R, PA-C 04/01/21 0000    Tegeler, Gwenyth Allegra, MD 04/02/21 [phone]

## 2021-03-31 NOTE — ED Notes (Signed)
Second set of blood cultures unable to be collected after multiple attempts by multiple providers. ?

## 2021-04-01 ENCOUNTER — Inpatient Hospital Stay (HOSPITAL_COMMUNITY): Payer: Medicaid Other

## 2021-04-01 ENCOUNTER — Telehealth: Payer: Self-pay | Admitting: Internal Medicine

## 2021-04-01 ENCOUNTER — Encounter (HOSPITAL_COMMUNITY): Payer: Self-pay | Admitting: Internal Medicine

## 2021-04-01 ENCOUNTER — Other Ambulatory Visit: Payer: Self-pay

## 2021-04-01 DIAGNOSIS — I340 Nonrheumatic mitral (valve) insufficiency: Secondary | ICD-10-CM

## 2021-04-01 DIAGNOSIS — E876 Hypokalemia: Secondary | ICD-10-CM | POA: Diagnosis not present

## 2021-04-01 DIAGNOSIS — I21A1 Myocardial infarction type 2: Secondary | ICD-10-CM | POA: Diagnosis not present

## 2021-04-01 DIAGNOSIS — Z5329 Procedure and treatment not carried out because of patient's decision for other reasons: Secondary | ICD-10-CM | POA: Diagnosis not present

## 2021-04-01 DIAGNOSIS — E43 Unspecified severe protein-calorie malnutrition: Secondary | ICD-10-CM | POA: Diagnosis not present

## 2021-04-01 DIAGNOSIS — E872 Acidosis, unspecified: Secondary | ICD-10-CM | POA: Diagnosis not present

## 2021-04-01 DIAGNOSIS — F1120 Opioid dependence, uncomplicated: Secondary | ICD-10-CM | POA: Diagnosis not present

## 2021-04-01 DIAGNOSIS — J13 Pneumonia due to Streptococcus pneumoniae: Secondary | ICD-10-CM | POA: Diagnosis present

## 2021-04-01 DIAGNOSIS — Z79899 Other long term (current) drug therapy: Secondary | ICD-10-CM | POA: Diagnosis not present

## 2021-04-01 DIAGNOSIS — R0603 Acute respiratory distress: Secondary | ICD-10-CM | POA: Diagnosis present

## 2021-04-01 DIAGNOSIS — E871 Hypo-osmolality and hyponatremia: Secondary | ICD-10-CM | POA: Diagnosis not present

## 2021-04-01 DIAGNOSIS — R778 Other specified abnormalities of plasma proteins: Secondary | ICD-10-CM | POA: Diagnosis not present

## 2021-04-01 DIAGNOSIS — R7401 Elevation of levels of liver transaminase levels: Secondary | ICD-10-CM | POA: Diagnosis not present

## 2021-04-01 DIAGNOSIS — C799 Secondary malignant neoplasm of unspecified site: Secondary | ICD-10-CM | POA: Diagnosis not present

## 2021-04-01 DIAGNOSIS — E860 Dehydration: Secondary | ICD-10-CM | POA: Diagnosis not present

## 2021-04-01 DIAGNOSIS — A419 Sepsis, unspecified organism: Secondary | ICD-10-CM | POA: Diagnosis not present

## 2021-04-01 DIAGNOSIS — I9589 Other hypotension: Secondary | ICD-10-CM | POA: Diagnosis not present

## 2021-04-01 DIAGNOSIS — C329 Malignant neoplasm of larynx, unspecified: Secondary | ICD-10-CM

## 2021-04-01 DIAGNOSIS — E46 Unspecified protein-calorie malnutrition: Secondary | ICD-10-CM | POA: Diagnosis not present

## 2021-04-01 DIAGNOSIS — R64 Cachexia: Secondary | ICD-10-CM | POA: Diagnosis not present

## 2021-04-01 DIAGNOSIS — Z681 Body mass index (BMI) 19 or less, adult: Secondary | ICD-10-CM | POA: Diagnosis not present

## 2021-04-01 DIAGNOSIS — I700 Atherosclerosis of aorta: Secondary | ICD-10-CM | POA: Diagnosis not present

## 2021-04-01 DIAGNOSIS — F17210 Nicotine dependence, cigarettes, uncomplicated: Secondary | ICD-10-CM | POA: Diagnosis not present

## 2021-04-01 DIAGNOSIS — I5041 Acute combined systolic (congestive) and diastolic (congestive) heart failure: Secondary | ICD-10-CM

## 2021-04-01 DIAGNOSIS — R061 Stridor: Secondary | ICD-10-CM | POA: Diagnosis not present

## 2021-04-01 DIAGNOSIS — F410 Panic disorder [episodic paroxysmal anxiety] without agoraphobia: Secondary | ICD-10-CM | POA: Diagnosis not present

## 2021-04-01 DIAGNOSIS — J189 Pneumonia, unspecified organism: Secondary | ICD-10-CM | POA: Diagnosis not present

## 2021-04-01 DIAGNOSIS — R490 Dysphonia: Secondary | ICD-10-CM | POA: Diagnosis not present

## 2021-04-01 DIAGNOSIS — Z20822 Contact with and (suspected) exposure to covid-19: Secondary | ICD-10-CM | POA: Diagnosis not present

## 2021-04-01 DIAGNOSIS — C328 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: Secondary | ICD-10-CM | POA: Diagnosis not present

## 2021-04-01 DIAGNOSIS — I214 Non-ST elevation (NSTEMI) myocardial infarction: Secondary | ICD-10-CM

## 2021-04-01 HISTORY — DX: Pneumonia due to Streptococcus pneumoniae: J13

## 2021-04-01 LAB — BASIC METABOLIC PANEL
Anion gap: 11 (ref 5–15)
BUN: 9 mg/dL (ref 6–20)
CO2: 25 mmol/L (ref 22–32)
Calcium: 9 mg/dL (ref 8.9–10.3)
Chloride: 101 mmol/L (ref 98–111)
Creatinine, Ser: 0.72 mg/dL (ref 0.61–1.24)
GFR, Estimated: >60 mL/min (ref >60)
Glucose, Bld: 113 mg/dL — ABNORMAL HIGH (ref 70–99)
Potassium: 4.1 mmol/L (ref 3.5–5.1)
Sodium: 137 mmol/L (ref 135–145)

## 2021-04-01 LAB — CBC
HCT: 32 % — ABNORMAL LOW (ref 39.0–52.0)
Hemoglobin: 11.1 g/dL — ABNORMAL LOW (ref 13.0–17.0)
MCH: 31.4 pg (ref 26.0–34.0)
MCHC: 34.7 g/dL (ref 30.0–36.0)
MCV: 90.7 fL (ref 80.0–100.0)
Platelets: 202 10*3/uL (ref 150–400)
RBC: 3.53 MIL/uL — ABNORMAL LOW (ref 4.22–5.81)
RDW: 14 % (ref 11.5–15.5)
WBC: 11.8 10*3/uL — ABNORMAL HIGH (ref 4.0–10.5)
nRBC: 0 % (ref 0.0–0.2)

## 2021-04-01 LAB — ECHOCARDIOGRAM COMPLETE
AR max vel: 2.29 cm2
AV Area VTI: 2.15 cm2
AV Area mean vel: 2.21 cm2
AV Mean grad: 2 mmHg
AV Peak grad: 3.8 mmHg
Ao pk vel: 0.97 m/s
Area-P 1/2: 4.36 cm2
Height: 71 in
S' Lateral: 3.9 cm
Weight: 1661.39 oz

## 2021-04-01 LAB — LIPID PANEL
Cholesterol: 98 mg/dL (ref 0–200)
HDL: 35 mg/dL — ABNORMAL LOW (ref >40)
LDL Cholesterol: 55 mg/dL (ref 0–99)
Total CHOL/HDL Ratio: 2.8 RATIO
Triglycerides: 40 mg/dL (ref <150)
VLDL: 8 mg/dL (ref 0–40)

## 2021-04-01 LAB — BRAIN NATRIURETIC PEPTIDE: B Natriuretic Peptide: 276.6 pg/mL — ABNORMAL HIGH (ref 0.0–100.0)

## 2021-04-01 LAB — RAPID URINE DRUG SCREEN, HOSP PERFORMED
Amphetamines: POSITIVE — AB
Barbiturates: NOT DETECTED
Benzodiazepines: NOT DETECTED
Cocaine: NOT DETECTED
Opiates: NOT DETECTED
Tetrahydrocannabinol: NOT DETECTED

## 2021-04-01 LAB — MRSA NEXT GEN BY PCR, NASAL: MRSA by PCR Next Gen: NOT DETECTED

## 2021-04-01 LAB — MAGNESIUM: Magnesium: 1.8 mg/dL (ref 1.7–2.4)

## 2021-04-01 LAB — LACTIC ACID, PLASMA: Lactic Acid, Venous: 2.1 mmol/L (ref 0.5–1.9)

## 2021-04-01 LAB — TROPONIN I (HIGH SENSITIVITY)
Troponin I (High Sensitivity): 1456 ng/L (ref <18)
Troponin I (High Sensitivity): 1512 ng/L (ref <18)

## 2021-04-01 LAB — C-REACTIVE PROTEIN: CRP: 8.5 mg/dL — ABNORMAL HIGH (ref <1.0)

## 2021-04-01 MED ORDER — ENOXAPARIN SODIUM 60 MG/0.6ML IJ SOSY
50.0000 mg | PREFILLED_SYRINGE | Freq: Once | INTRAMUSCULAR | Status: DC
  Filled 2021-04-01: qty 0.5

## 2021-04-01 MED ORDER — PERFLUTREN LIPID MICROSPHERE
1.0000 mL | INTRAVENOUS | Status: AC | PRN
  Administered 2021-04-01: 2 mL via INTRAVENOUS
  Filled 2021-04-01: qty 10

## 2021-04-01 MED ORDER — SODIUM CHLORIDE 0.9 % IV SOLN: 1.0000 g | INTRAVENOUS | Status: DC

## 2021-04-01 MED ORDER — PROSOURCE PLUS PO LIQD
30.0000 mL | Freq: Two times a day (BID) | ORAL | Status: DC
  Administered 2021-04-01: 30 mL via ORAL
  Filled 2021-04-01: qty 30

## 2021-04-01 MED ORDER — PANTOPRAZOLE SODIUM 40 MG PO TBEC
40.0000 mg | DELAYED_RELEASE_TABLET | Freq: Every day | ORAL | Status: DC
Start: 2021-04-01 — End: 2021-04-01

## 2021-04-01 MED ORDER — POTASSIUM CHLORIDE 2 MEQ/ML IV SOLN
INTRAVENOUS | Status: DC
  Filled 2021-04-01: qty 1000

## 2021-04-01 MED ORDER — METHADONE HCL 10 MG/ML PO CONC: 60.0000 mg | Freq: Every day | ORAL | Status: DC

## 2021-04-01 MED ORDER — GUAIFENESIN ER 600 MG PO TB12: 1200.0000 mg | ORAL_TABLET | Freq: Two times a day (BID) | ORAL | Status: DC

## 2021-04-01 MED ORDER — DEXAMETHASONE SODIUM PHOSPHATE 10 MG/ML IJ SOLN
8.0000 mg | INTRAMUSCULAR | Status: DC
  Administered 2021-04-01: 8 mg via INTRAVENOUS
  Filled 2021-04-01: qty 0.8

## 2021-04-01 MED ORDER — SODIUM CHLORIDE 0.9 % IV SOLN
3.0000 g | Freq: Four times a day (QID) | INTRAVENOUS | Status: DC
  Filled 2021-04-01 (×2): qty 8

## 2021-04-01 MED ORDER — SODIUM CHLORIDE 0.9% FLUSH: 3.0000 mL | Freq: Two times a day (BID) | INTRAVENOUS | Status: DC

## 2021-04-01 MED ORDER — ENOXAPARIN SODIUM 60 MG/0.6ML IJ SOSY: 50.0000 mg | PREFILLED_SYRINGE | Freq: Once | INTRAMUSCULAR | Status: DC

## 2021-04-01 MED ORDER — NICOTINE 14 MG/24HR TD PT24
14.0000 mg | MEDICATED_PATCH | Freq: Once | TRANSDERMAL | Status: DC
  Administered 2021-04-01: 14 mg via TRANSDERMAL
  Filled 2021-04-01: qty 1

## 2021-04-01 MED ORDER — SODIUM CHLORIDE 0.9 % IV SOLN
500.0000 mg | INTRAVENOUS | Status: DC
  Filled 2021-04-01: qty 5

## 2021-04-01 MED ORDER — ENOXAPARIN SODIUM 60 MG/0.6ML IJ SOSY: 50.0000 mg | PREFILLED_SYRINGE | Freq: Two times a day (BID) | INTRAMUSCULAR | Status: DC

## 2021-04-01 MED ORDER — METHADONE HCL 10 MG PO TABS
50.0000 mg | ORAL_TABLET | Freq: Every day | ORAL | Status: DC
  Administered 2021-04-01: 50 mg via ORAL
  Filled 2021-04-01: qty 5

## 2021-04-01 MED ORDER — ASPIRIN 81 MG PO CHEW
324.0000 mg | CHEWABLE_TABLET | Freq: Once | ORAL | Status: AC
  Administered 2021-04-01: 324 mg via ORAL
  Filled 2021-04-01: qty 4

## 2021-04-01 MED ORDER — POTASSIUM CHLORIDE 2 MEQ/ML IV SOLN: INTRAVENOUS | Status: DC

## 2021-04-01 MED ORDER — ASPIRIN EC 81 MG PO TBEC
81.0000 mg | DELAYED_RELEASE_TABLET | Freq: Every day | ORAL | Status: DC
  Filled 2021-04-01: qty 1

## 2021-04-01 MED ORDER — LACTATED RINGERS IV SOLN: INTRAVENOUS | Status: DC

## 2021-04-01 NOTE — Assessment & Plan Note (Addendum)
Sepsis ?Met criteria for sepsis at the time of presentation to the ED with tachycardia, tachypnea, leukocytosis, and lactic acidosis. Chest x-ray showing hazy and patchy opacities in the right middle lobe worrisome for infection.  COVID and influenza PCR negative.  Not hypoxic. ?-Patient was given 30 cc/kg fluid boluses and sepsis physiology has improved.  No longer tachycardic or tachypneic.  Not hypotensive.  Continue ceftriaxone and azithromycin.  Blood cultures pending.  Trend WBC count and lactate.  Continuous pulse ox, supplemental oxygen as needed to keep oxygen saturation above 92%. ?

## 2021-04-01 NOTE — Assessment & Plan Note (Addendum)
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  He has mild stridor only when speaking but no respiratory distress otherwise.  CT soft tissue neck showing unchanged appearance of lobulated mass of the larynx, slightly decreased size of partially necrotic mass of the right neck, and no signs of airway compromise. ?-Decadron given, continue to monitor respiratory status closely.  Continuous pulse ox, supplemental oxygen as needed. ? ?

## 2021-04-01 NOTE — ED Notes (Signed)
Report called to Janett Billow, Therapist, sports at [facility]. ?

## 2021-04-01 NOTE — Progress Notes (Signed)
Pt is increasingly agitated throughout the shift. Pt is frustrated with dose ordered of methadone. RN notified MD as well as pharmacy. ?RN entered room to find pt with cigarettes in his lap while his mother held a bag in front of him. RN asked pt if she could keep them outside the room while here then return with discharge. Pt became increasingly agitated.  ?Pt requested to leave AMA. ?Pt was returned his pack of cigarettes and lighter. ?MD notified    ?

## 2021-04-01 NOTE — Progress Notes (Signed)
Date and time results received: 04/01/21 0724 ? ?Test: Troponin I ?Critical Value: 1512 ? ?Name of Provider Notified: RN Cristal Generous notified. P. Sing paged.  ? ?

## 2021-04-01 NOTE — Progress Notes (Addendum)
PROGRESS NOTE                                                                                                                                                                                                             Patient Demographics:    David [name], is a 46 y.o. 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  Came to the ER at [facility] long where he was found to have an elevated troponin and pneumonia.  He was admitted and transferred to [facility] for further work-up.   Subjective:    [name] today has, No headache, No chest pain, No abdominal pain - No Nausea, No new weakness tingling or numbness, no shortness of breath, feels better - no pain anywhere.   Assessment  & Plan :    Mild NSTEMI due to CAP >> hypotension and demand mismatch.  EKG nonacute, chest pain-free, currently on aspirin and Lovenox, blood pressure too low for beta-blocker.  We will also add statin.  Cardiology team to evaluate soon.  Will defer further management to cardiology, currently stable, no anxiety currently.  Echocardiogram pending. UDS positive for amphetamines.  CAP.  Suspicion for aspiration pneumonia.  Currently on empiric antibiotics.  Improving, speech to follow.  PPI added.  Stage IV laryngeal cancer.  CT soft tissue neck nonacute, outpatient follow-up with his ENT physician  Dr. Isaias Cowman and oncologist Dr. Purcell Mouton.  Last chemotherapy 4 weeks ago.  Dehydration with hyponatremia and hypokalemia with hypotension.  IV fluids, sodium levels have improved, potassium replaced.  History of substance abuse and narcotic abuse with breaking of pain contract in the past.  Outpatient follow-up with his pain clinic, home dose methadone.  Currently in no distress.  Is positive for amphetamines.  Counseled.  History of smoking.  Continue NicoDerm patch.  Mild baseline hypotension.  Low-dose steroids and midodrine as needed along with IV fluids.  Asymptomatic transaminitis.  Likely due to transient acute on chronic hypotension, hydrate with fluids, as needed midodrine, IV Decadron low-dose for now and monitor.  Panic attack after argument with mother.  Resolved.  Feels safe going home.  Supportive care for now.  Already hypotensive and takes methadone at high dose.  We are avoiding further benzodiazepines.  Outpatient psych follow-up.      Condition - Extremely Guarded  Family Communication  :  none present  Code Status :  Full  Consults  :  Cards  PUD Prophylaxis : PPI   Procedures  :     CT - 1. Severely motion degraded study.  No airway compromise. 2. Unchanged appearance of lobulated mass of the larynx, within limitations imposed by the degree of motion. 3. Slight decrease in size of partially necrotic mass of the right neck. No other lymph nodes are visualized. Aortic Atherosclerosis (ICD10-I70.0).       Disposition Plan  :    Status is: Inpatient  DVT Prophylaxis  :  Lovenox    Lab Results  Component Value Date   PLT 202 04/01/2021    Diet :  Diet Order             Diet NPO time specified Except for: Sips with Meds  Diet effective now                    Inpatient Medications  Scheduled Meds:  dexamethasone (DECADRON) injection  8 mg Intravenous Q24H   enoxaparin (LOVENOX) injection  50 mg Subcutaneous Once   enoxaparin (LOVENOX)  injection  50 mg Subcutaneous Once   [START ON 04/02/2021] enoxaparin (LOVENOX) injection  50 mg Subcutaneous Q12H   guaiFENesin  1,200 mg Oral BID   methadone  50 mg Oral Daily   nicotine  14 mg Transdermal Once   pantoprazole  40 mg Oral QHS   Continuous Infusions:  azithromycin     lactated ringers with kcl 75 mL/hr at 04/01/21 0757   PRN Meds:.  Time Spent in minutes  30   Lala Lund M.D on 04/01/2021 at 8:24 AM  To page go to [email or web address]   [facility] -  Office  [phone]  See all Orders from today for further details    Objective:   Vitals:   04/01/21 0215 04/01/21 0330 04/01/21 0520 04/01/21 0728  BP: (!) 105/54 (!) 141/97 102/76 90/79  Pulse: 91 92 100 76  Resp: '20 18 20 17  '$ Temp:   97.9 F (36.6 C) 98.2 F (36.8 C)  TempSrc:   Oral Axillary  SpO2: 96% 98% 100% 94%  Weight:   47.1 kg   Height:   '5\' 11"'$  (1.803 m)     Wt Readings from Last 3 Encounters:  04/01/21 47.1 kg  03/30/21 47.6 kg  03/21/21 49.9 kg     Intake/Output Summary (Last 24 hours) at 04/01/2021 0824 Last data filed at 04/01/2021 0540 Gross per 24 hour  Intake 1527.9 ml  Output --  Net 1527.9 ml     Physical Exam  Awake Alert, No new F.N deficits, Normal affect Tucker.AT,PERRAL Supple Neck, No JVD,   Symmetrical Chest wall movement, Good air movement bilaterally, CTAB RRR,No Gallops,Rubs or new Murmurs,  +ve B.Sounds, Abd Soft, No tenderness,   No Cyanosis, Clubbing or edema      Data Review:  CBC Recent Labs  Lab 03/30/21 0856 03/31/21 2104 04/01/21 0612  WBC 11.7* 24.4* 11.8*  HGB 14.0 13.7 11.1*  HCT 41.7 41.0 32.0*  PLT 308 297 202  MCV 91.6 91.9 90.7  MCH 30.8 30.7 31.4  MCHC 33.6 33.4 34.7  RDW 14.5 14.1 14.0  LYMPHSABS 0.8 0.3*  --   MONOABS 1.3* 1.2*  --   EOSABS 0.1 0.0  --   BASOSABS 0.1 0.1  --     Electrolytes Recent Labs  Lab 03/30/21 0856 03/31/21 2104 03/31/21 2108 04/01/21 0324 04/01/21 0612  NA 137 130*  --   --  137  K 4.7  3.2*  --   --  4.1  CL 96* 93*  --   --  101  CO2 32 28  --   --  25  GLUCOSE 106* 88  --   --  113*  BUN 26* 19  --   --  9  CREATININE 0.92 0.63  --   --  0.72  CALCIUM 10.7* 9.1  --   --  9.0  AST 88* 173*  --   --   --   ALT 39 52*  --   --   --   ALKPHOS 73 66  --   --   --   BILITOT 0.6 0.7  --   --   --   ALBUMIN 4.3 4.0  --   --   --   MG  --   --   --   --  1.8  CRP  --   --   --   --  8.5*  LATICACIDVEN  --   --  2.4* 2.1*  --   INR  --  1.2  --   --   --     ------------------------------------------------------------------------------------------------------------------ No results for input(s): CHOL, HDL, LDLCALC, TRIG, CHOLHDL, LDLDIRECT in the last 72 hours.  No results found for: HGBA1C  No results for input(s): TSH, T4TOTAL, T3FREE, THYROIDAB in the last 72 hours.  Invalid input(s): FREET3 ------------------------------------------------------------------------------------------------------------------ ID Labs Recent Labs  Lab 03/30/21 0856 03/31/21 2104 03/31/21 2108 04/01/21 0324 04/01/21 0612  WBC 11.7* 24.4*  --   --  11.8*  PLT 308 297  --   --  202  CRP  --   --   --   --  8.5*  LATICACIDVEN  --   --  2.4* 2.1*  --   CREATININE 0.92 0.63  --   --  0.72   Cardiac Enzymes No results for input(s): CKMB, TROPONINI, MYOGLOBIN in the last 168 hours.  Invalid input(s): CK   Radiology Reports CT Soft Tissue Neck W Contrast  Result Date: 03/31/2021 CLINICAL DATA:  Stage IV laryngeal cancer.  Stridor. EXAM: CT NECK WITH CONTRAST TECHNIQUE: Multidetector CT imaging of the neck was performed using the standard protocol following the bolus administration of intravenous contrast. RADIATION DOSE REDUCTION: This exam was performed according to the departmental dose-optimization program which includes automated exposure control, adjustment of the mA and/or kV according to patient size and/or use of iterative reconstruction technique. CONTRAST:  1m OMNIPAQUE  IOHEXOL 300 MG/ML  SOLN COMPARISON:  CT neck 11/20/2020 FINDINGS: Examination is severely degraded by motion. Pharynx and larynx: There is a lobulated mass of the larynx, predominantly right-sided. This is largely obscured by motion but appears unchanged. The airway is widely patent. Salivary glands: No inflammation, mass, or stone. Thyroid: Normal. Lymph nodes: Partially necrotic mass of the right neck has  slightly decreased in size from 3.2 cm 2 2.3 cm. No other lymph nodes are visualized. Vascular: Calcific aortic atherosclerosis. Limited intracranial: Negative. Visualized orbits: Negative. Mastoids and visualized paranasal sinuses: Clear. Skeleton: No acute or aggressive process. Upper chest: Hazy opacity at the right lung apex, favored to be scarring. Other: None. IMPRESSION: 1. Severely motion degraded study.  No airway compromise. 2. Unchanged appearance of lobulated mass of the larynx, within limitations imposed by the degree of motion. 3. Slight decrease in size of partially necrotic mass of the right neck. No other lymph nodes are visualized. Aortic Atherosclerosis (ICD10-I70.0). Electronically Signed   By: Ulyses Jarred M.D.   On: 03/31/2021 22:09   DG Chest Port 1 View  Result Date: 04/01/2021 CLINICAL DATA:  46 year old male with history of respiratory distress. EXAM: PORTABLE CHEST 1 VIEW COMPARISON:  Chest x-ray 03/31/2021. FINDINGS: Patchy ill-defined opacities and areas of interstitial prominence are again noted throughout the right mid to lower lung. Blunting of the right costophrenic sulcus again noted. Left lung is clear. No left pleural effusion. No pneumothorax. No evidence of pulmonary edema. Heart size is normal. Upper mediastinal contours are within normal limits. IMPRESSION: 1. Findings remain concerning for probable bronchopneumonia in the right middle and potentially right lower lobes. 2. Small right parapneumonic pleural effusion. Electronically Signed   By: Vinnie Langton M.D.    On: 04/01/2021 07:06   DG Chest Port 1 View  Result Date: 03/31/2021 CLINICAL DATA:  Shortness of breath. EXAM: PORTABLE CHEST 1 VIEW COMPARISON:  Chest x-ray with ribs 02/18/2019. FINDINGS: There is some hazy and patchy opacities in the right middle lobe. Left lung is clear. Staple line is again noted in the medial left upper lung. No pleural effusion or pneumothorax. Cardiomediastinal silhouette is within normal limits. IMPRESSION: Hazy and patchy opacities in the right middle lobe worrisome for infection. Followup PA and lateral chest X-ray is recommended in 3-4 weeks following trial of antibiotic therapy to ensure resolution and exclude underlying malignancy. Electronically Signed   By: Ronney Asters M.D.   On: 03/31/2021 20:57

## 2021-04-01 NOTE — Subjective & Objective (Addendum)
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  Satting 100% on room air with EMS and was placed on 2 L supplemental oxygen for comfort.  Patient tachycardic and tachypneic on arrival to the ED.  He had mild stridor on exam which improved after administration of Decadron.  Labs showing WBC 28.3 with neutrophilic predominance.  Sodium 130.  Potassium 3.2.  Chloride 93.  Total protein 9.0.  AST 173, ALT 52.  Alk phos and T. bili normal.  Lactic acid 2.4.  1 set of blood cultures drawn.  COVID and influenza PCR negative.  UA with negative nitrite, trace leukocytes, and microscopy showing 11-20 WBCs and no bacteria.  High-sensitivity troponin 897.  Chest x-ray showing hazy and patchy opacities in the right middle lobe worrisome for infection.  CT soft tissue neck showing unchanged appearance of lobulated mass of the larynx, slightly decreased size of partially necrotic mass of the right neck, and no signs of airway compromise. Patient was given ceftriaxone, azithromycin, 30 cc/kg fluid boluses, and IV potassium 10 mEq x 2. ED PA discussed the case with cardiologist Dr. Shirlee Latch who felt that the troponin elevation was due to demand ischemia from sepsis.  He recommended trending troponin and obtaining echocardiogram. ? ?Patient states his mother was making him anxious and he had a panic attack yesterday after which he started feeling short of breath.  He is not coughing much.  Not having any fevers.  Denies any chest pain at present but does recall an episode of left-sided chest pain 2 or 3 days ago.  He does not remember what he was doing when he experienced chest pain at that time, does not remember how long it lasted or what it felt like.  States he used heroin for 16 years but has quit and  currently on methadone.  Denies any drug use at present. ?

## 2021-04-01 NOTE — Assessment & Plan Note (Addendum)
Troponin 897 > 1456.  EKGs repeated several times showing slight ST elevation in V2.  Patient is not endorsing any chest pain at present but reports an episode of chest pain 2 or 3 days ago.  I spoke to Dr. Shirlee Latch from cardiology who also reviewed patient's EKGs, given lack of active chest pain and no ST elevations in contiguous leads or reciprocal changes, STEMI felt to be less likely.  He recommends starting Lovenox for anticoagulation instead of heparin and obtaining echocardiogram.  Also recommends checking CRP.  Cardiology team will consult. ?-Admit to progressive care unit at [facility].  Full dose anticoagulation (Lovenox) ordered.  Aspirin ordered.  Echocardiogram ordered.  Checking CRP level.  Continue to trend troponin.  Appreciate cardiology recommendations.  Check UDS. ?

## 2021-04-01 NOTE — H&P (Signed)
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  Satting 100% on room air with EMS and was placed on 2 L supplemental oxygen for comfort.  Patient tachycardic and tachypneic on arrival to the ED.  He had mild stridor on exam which improved after administration of Decadron.  Labs showing WBC 81.1 with neutrophilic predominance.  Sodium 130.  Potassium 3.2.  Chloride 93.  Total protein 9.0.  AST 173, ALT 52.  Alk phos and T. bili normal.  Lactic acid 2.4.  1 set of blood cultures drawn.  COVID and influenza PCR negative.  UA with negative nitrite, trace leukocytes, and microscopy showing 11-20 WBCs and no bacteria.  High-sensitivity troponin 897.  Chest x-ray showing hazy and patchy opacities in the right middle lobe worrisome for infection.  CT soft tissue neck showing unchanged appearance of lobulated mass of the larynx, slightly decreased size of partially necrotic mass of the right neck, and no signs of airway compromise. Patient was given ceftriaxone, azithromycin, 30 cc/kg fluid boluses, and IV potassium 10 mEq x 2. ED PA discussed the case with cardiologist Dr. Shirlee Latch who felt that the troponin elevation was due to demand ischemia from sepsis.  He recommended trending troponin and obtaining echocardiogram.  Patient states his mother was making him anxious and he had a panic attack yesterday after which he started feeling short of breath.  He is not coughing much.  Not having any fevers.  Denies  any chest pain at present but does recall an episode of left-sided chest pain 2 or 3 days ago.  He does not remember what he was doing when he experienced chest pain at that time, does not remember how long it lasted or what it felt like.  States he used heroin for 16 years but has quit and currently on methadone.  Denies any drug use at present.   Review of Systems:  Review of Systems  All other systems reviewed and are negative.  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[place])    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 [facility])    Pneumothorax    Left lung spontaneous pneumothorax at age 49 yr     Past Surgical History:  Procedure Laterality Date   chest tubes Left      reports that he has been smoking cigarettes. He has been smoking an average of 3 packs per day. He has never used smokeless tobacco. He reports that he does not currently use alcohol after a past usage of about 12.0 standard drinks per week. He reports that he does not currently use drugs after having used the following drugs: IV.  No Known Allergies  History reviewed. No pertinent family history.  Prior to Admission medications   Medication Sig Start Date End Date Taking? Authorizing Provider  clotrimazole (MYCELEX) 10 MG troche Take 1 tablet (10 mg total) by mouth 5 (five) times daily. Suck on these slowly. 02/26/21  Yes [name], MD  guaiFENesin (MUCINEX) 600 MG 12 hr  tablet Take 2 tablets (1,200 mg total) by mouth 2 (two) times daily. 02/26/21  Yes Pickenpack-Cousar, Athena N, NP  lidocaine (XYLOCAINE) 2 % solution Patient: Mix 1part 2% viscous lidocaine, 1part H20. Swish/swallow 61m of diluted mixture, 359m before meals and @bedtime , up to QID prn soreness 02/26/21  Yes SqEppie GibsonMD  methadone (DOLOPHINE) 10 MG/ML solution Take 6 mLs (60 mg total) by mouth daily. 01/08/21  Yes Pickenpack-Cousar, AtCarlena SaxNP  nicotine (NICODERM CQ - DOSED IN MG/24 HOURS) 14 mg/24hr patch Place 1  patch (14 mg total) onto the skin daily. Apply 21 mg patch daily x 6 wk, then 1447match daily x 2 wk, then 7 mg patch daily x 2 wk 02/26/21  Yes SquEppie GibsonD  prochlorperazine (COMPAZINE) 10 MG tablet Take 1 tablet (10 mg total) by mouth every 6 (six) hours as needed. Patient taking differently: Take 10 mg by mouth every 6 (six) hours as needed for nausea. 03/30/21  Yes IruBenay PikeD  traMADol (ULTRAM) 50 MG tablet Take 1 tablet (50 mg total) by mouth 2 (two) times daily. 02/26/21  Yes Pickenpack-Cousar, AthCarlena SaxP  dronabinol (MARINOL) 2.5 MG capsule Take 2.5 mg by mouth 2 (two) times daily before a meal. Patient not taking: Reported on 03/31/2021    [provider]  nicotine (NICODERM CQ - DOSED IN MG/24 HOURS) 21 mg/24hr patch Place 1 patch (21 mg total) onto the skin daily. Apply 21 mg patch daily x 6 wk, then [id number] daily x 2 wk, then 7 mg patch daily x 2 wk Patient not taking: Reported on 04/01/2021 02/26/21   SquiEppie Gibson  ondansetron (ZOFRAN) 8 MG tablet Take 1 tablet (8 mg total) by mouth every 8 (eight) hours as needed for nausea or vomiting. Starting day 3 after chemotherapy 01/01/21   Heilingoetter, Cassandra L, PA-C  sucralfate (CARAFATE) 1 g tablet Take 1 tablet (1 g total) by mouth 4 (four) times daily -  with meals and at bedtime. Crush and dissolve in 10 mL of warm water prior to swallowing, take 20 minutes before meals Patient not taking: Reported on 04/01/2021 02/15/21   KinaGery Pray    Physical Exam: Vitals:   04/01/21 0115 04/01/21 0130 04/01/21 0215 04/01/21 0330  BP: (!) 89/70 93/79 (!) 105/54 (!) 141/97  Pulse: 94 93 91 92  Resp: 20  20 18   Temp:      TempSrc:      SpO2: 97% 96% 96% 98%  Weight:      Height:        Physical Exam Vitals and nursing note reviewed.  Constitutional:      General: He is not in acute distress. HENT:     Head: Normocephalic and atraumatic.  Eyes:     Extraocular Movements: Extraocular movements intact.      Conjunctiva/sclera: Conjunctivae normal.  Cardiovascular:     Rate and Rhythm: Normal rate and regular rhythm.     Pulses: Normal pulses.  Pulmonary:     Effort: No respiratory distress.     Breath sounds: No wheezing or rales.     Comments: Hoarseness of voice Mild stridor only when speaking Abdominal:     General: Bowel sounds are normal. There is no distension.     Palpations: Abdomen is soft.     Tenderness: There is no abdominal tenderness.  Musculoskeletal:        General: No swelling or tenderness.     Cervical back: Normal range  of motion.  Skin:    General: Skin is warm and dry.  Neurological:     General: No focal deficit present.     Mental Status: He is alert and oriented to person, place, and time.     Labs on Admission: I have personally reviewed following labs and imaging studies  CBC: Recent Labs  Lab 03/30/21 0856 03/31/21 2104  WBC 11.7* 24.4*  NEUTROABS 9.4* 22.6*  HGB 14.0 13.7  HCT 41.7 41.0  MCV 91.6 91.9  PLT 308 165   Basic Metabolic Panel: Recent Labs  Lab 03/30/21 0856 03/31/21 2104  NA 137 130*  K 4.7 3.2*  CL 96* 93*  CO2 32 28  GLUCOSE 106* 88  BUN 26* 19  CREATININE 0.92 0.63  CALCIUM 10.7* 9.1   GFR: Estimated Creatinine Clearance: 77 mL/min (by C-G formula based on SCr of 0.63 mg/dL). Liver Function Tests: Recent Labs  Lab 03/30/21 0856 03/31/21 2104  AST 88* 173*  ALT 39 52*  ALKPHOS 73 66  BILITOT 0.6 0.7  PROT 9.1* 9.0*  ALBUMIN 4.3 4.0   No results for input(s): LIPASE, AMYLASE in the last 168 hours. No results for input(s): AMMONIA in the last 168 hours. Coagulation Profile: Recent Labs  Lab 03/31/21 2104  INR 1.2   Cardiac Enzymes: No results for input(s): CKTOTAL, CKMB, CKMBINDEX, TROPONINI in the last 168 hours. BNP (last 3 results) No results for input(s): PROBNP in the last 8760 hours. HbA1C: No results for input(s): HGBA1C in the last 72 hours. CBG: No results for input(s): GLUCAP in the last  168 hours. Lipid Profile: No results for input(s): CHOL, HDL, LDLCALC, TRIG, CHOLHDL, LDLDIRECT in the last 72 hours. Thyroid Function Tests: No results for input(s): TSH, T4TOTAL, FREET4, T3FREE, THYROIDAB in the last 72 hours. Anemia Panel: No results for input(s): VITAMINB12, FOLATE, FERRITIN, TIBC, IRON, RETICCTPCT in the last 72 hours. Urine analysis:    Component Value Date/Time   COLORURINE YELLOW 03/31/2021 2211   APPEARANCEUR HAZY (A) 03/31/2021 2211   LABSPEC 1.024 03/31/2021 2211   PHURINE 5.0 03/31/2021 2211   GLUCOSEU NEGATIVE 03/31/2021 2211   HGBUR MODERATE (A) 03/31/2021 2211   BILIRUBINUR NEGATIVE 03/31/2021 2211   KETONESUR 5 (A) 03/31/2021 2211   PROTEINUR 100 (A) 03/31/2021 2211   NITRITE NEGATIVE 03/31/2021 2211   LEUKOCYTESUR TRACE (A) 03/31/2021 2211    Radiological Exams on Admission: I have personally reviewed images CT Soft Tissue Neck W Contrast  Result Date: 03/31/2021 CLINICAL DATA:  Stage IV laryngeal cancer.  [place]. EXAM: CT NECK WITH CONTRAST TECHNIQUE: Multidetector CT imaging of the neck was performed using the standard protocol following the bolus administration of intravenous contrast. RADIATION DOSE REDUCTION: This exam was performed according to the departmental dose-optimization program which includes automated exposure control, adjustment of the mA and/or kV according to patient size and/or use of iterative reconstruction technique. CONTRAST:  69m OMNIPAQUE IOHEXOL 300 MG/ML  SOLN COMPARISON:  CT neck 11/20/2020 FINDINGS: Examination is severely degraded by motion. Pharynx and larynx: There is a lobulated mass of the larynx, predominantly right-sided. This is largely obscured by motion but appears unchanged. The airway is widely patent. Salivary glands: No inflammation, mass, or stone. Thyroid: Normal. Lymph nodes: Partially necrotic mass of the right neck has slightly decreased in size from 3.2 cm 2 2.3 cm. No other lymph nodes are visualized.  Vascular: Calcific aortic atherosclerosis. Limited intracranial: Negative. Visualized orbits: Negative. Mastoids and visualized paranasal sinuses: Clear. Skeleton: No acute or aggressive  process. Upper chest: Hazy opacity at the right lung apex, favored to be scarring. Other: None. IMPRESSION: 1. Severely motion degraded study.  No airway compromise. 2. Unchanged appearance of lobulated mass of the larynx, within limitations imposed by the degree of motion. 3. Slight decrease in size of partially necrotic mass of the right neck. No other lymph nodes are visualized. Aortic Atherosclerosis (ICD10-I70.0). Electronically Signed   By: Ulyses Jarred M.D.   On: 03/31/2021 22:09   DG Chest Port 1 View  Result Date: 03/31/2021 CLINICAL DATA:  Shortness of breath. EXAM: PORTABLE CHEST 1 VIEW COMPARISON:  Chest x-ray with ribs 02/18/2019. FINDINGS: There is some hazy and patchy opacities in the right middle lobe. Left lung is clear. Staple line is again noted in the medial left upper lung. No pleural effusion or pneumothorax. Cardiomediastinal silhouette is within normal limits. IMPRESSION: Hazy and patchy opacities in the right middle lobe worrisome for infection. Followup PA and lateral chest X-ray is recommended in 3-4 weeks following trial of antibiotic therapy to ensure resolution and exclude underlying malignancy. Electronically Signed   By: Ronney Asters M.D.   On: 03/31/2021 20:57    EKG: I have personally reviewed EKG: Sinus rhythm, mild ST elevation in V2.  Assessment/Plan Principal Problem:   Elevated troponin Active Problems:   CAP (community acquired pneumonia)   Laryngeal cancer (HCC)   Hyponatremia   Hypokalemia   Elevated transaminase level    Assessment and Plan: * Elevated troponin Troponin 897 > 1456.  EKGs repeated several times showing slight ST elevation in V2.  Patient is not endorsing any chest pain at present but reports an episode of chest pain 2 or 3 days ago.  I spoke to Dr.  Shirlee Latch from cardiology who also reviewed patient's EKGs, given lack of active chest pain and no ST elevations in contiguous leads or reciprocal changes, STEMI felt to be less likely.  He recommends starting Lovenox for anticoagulation instead of heparin and obtaining echocardiogram.  Also recommends checking CRP.  Cardiology team will consult. -Admit to progressive care unit at [facility].  Full dose anticoagulation (Lovenox) ordered.  Aspirin ordered.  Echocardiogram ordered.  Checking CRP level.  Continue to trend troponin.  Appreciate cardiology recommendations.  Check UDS.  CAP (community acquired pneumonia) Sepsis Met criteria for sepsis at the time of presentation to the ED with tachycardia, tachypnea, leukocytosis, and lactic acidosis. Chest x-ray showing hazy and patchy opacities in the right middle lobe worrisome for infection.  COVID and influenza PCR negative.  Not hypoxic. -Patient was given 30 cc/kg fluid boluses and sepsis physiology has improved.  No longer tachycardic or tachypneic.  Not hypotensive.  Continue ceftriaxone and azithromycin.  Blood cultures pending.  Trend WBC count and lactate.  Continuous pulse ox, supplemental oxygen as needed to keep oxygen saturation above 92%.  Laryngeal cancer ([place]) Stage IV squamous cell laryngeal cancer on chemo and radiation Patient is not hypoxic, currently satting 98% on room air.  He has mild stridor only when speaking but no respiratory distress otherwise.  CT soft tissue neck showing unchanged appearance of lobulated mass of the larynx, slightly decreased size of partially necrotic mass of the right neck, and no signs of airway compromise. -Decadron given, continue to monitor respiratory status closely.  Continuous pulse ox, supplemental oxygen as needed.   Elevated transaminase level Possibly due to sepsis.  Patient is not endorsing any GI symptoms and abdominal exam benign. -Continue to monitor LFTs and order further work-up if  not improving  Hypokalemia Mild. -Monitor potassium and magnesium levels, replace if low.  Hyponatremia Mild. -IV fluid hydration, continue to monitor   DVT prophylaxis: Lovenox Code Status: Full Code (discussed with patient) Family Communication: No family available at this time.  Diagnostic findings and treatment plan discussed with the patient. Admission status: Inpatient,  progressive care unit It is my clinical opinion that admission to INPATIENT is reasonable and necessary because of the expectation that this patient will require hospital care that crosses at least 2 midnights to treat this condition based on the medical complexity of the problems presented.  Given the aforementioned information, the predictability of an adverse outcome is felt to be significant.   Shela Leff, MD [facility] 04/01/2021, 3:40 AM

## 2021-04-01 NOTE — Assessment & Plan Note (Signed)
Possibly due to sepsis.  Patient is not endorsing any GI symptoms and abdominal exam benign. ?-Continue to monitor LFTs and order further work-up if not improving ?

## 2021-04-01 NOTE — ED Notes (Signed)
Repeat lactic acid sent to lab.

## 2021-04-01 NOTE — Consult Note (Addendum)
Cardiology Consultation:   Patient ID: David [name] MRN: [id number]; DOB: Jul 10, 1975  Admit date: 03/31/2021 Date of Consult: 04/01/2021  PCP:  Default, Provider, MD   [place] Providers Cardiologist: New   Patient Profile:   David [name] is a 46 y.o. 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 Candiss Norse.  History of Present Illness:   David [name] had a sudden onset shortness of breath yesterday after having argument with mom.  Like panic attack but dyspnea persisted and EMS was called.  Oxygen saturation was 100% on room air and placed on supplemental 2 L oxygen.  Work-up revealed elevated white blood count, AST ALT.  Chest x-ray worrisome for infection.  He was admitted for sepsis secondary to community-acquired pneumonia.  He was given Decadron and broad-spectrum antibiotic.  Symptoms improving.  Overnight fellow consulted for elevated troponin who recommended Lovenox therapy.  Patient denies chest pain, orthopnea, PND, syncope, lower extremity edema, palpitation, dizziness or melena.  Walks around without chest pain.  CT of neck yesterday showed aortic atherosclerosis.  CRP 8.5 Hs-Troponin 897>>1456>>1512 Urine drug screen positive for amphetamine  Patient reports ongoing tobacco smoking greater than 2 packs/day.  He endorses smoking marijuana.  Denies using heroin or cocaine. Denies prior cardiac history.  Denies family history of cardiac disease.  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[place])    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 ([place])    Pneumothorax    Left lung spontaneous pneumothorax at age 67 yr     Past Surgical History:  Procedure Laterality Date   chest tubes Left      Inpatient Medications: Scheduled  Meds:  (feeding supplement) PROSource Plus  30 mL Oral BID BM   dexamethasone (DECADRON) injection  8 mg Intravenous Q24H   enoxaparin (LOVENOX) injection  50 mg Subcutaneous Once   enoxaparin (LOVENOX) injection  50 mg Subcutaneous Once   [START ON 04/02/2021] enoxaparin (LOVENOX) injection  50 mg Subcutaneous Q12H   guaiFENesin  1,200 mg Oral BID   methadone  50 mg Oral Daily   nicotine  14 mg Transdermal Once   pantoprazole  40 mg Oral QHS   Continuous Infusions:  azithromycin     lactated ringers with kcl 75 mL/hr at 04/01/21 0757   PRN Meds:   Allergies:   No Known Allergies  Social History:   Social History   Socioeconomic History   Marital status: Divorced    Spouse name: Not on file   Number of children: Not on file   Years of education: Not on file   Highest education level: Not on file  Occupational History   Not on file  Tobacco Use   Smoking status: Every Day    Packs/day: 3.00    Types: Cigarettes   Smokeless tobacco: Never  Vaping Use   Vaping Use: Never used  Substance and Sexual Activity   Alcohol use: Not Currently    Alcohol/week: 12.0 standard drinks    Types: 12 Cans of beer per week   Drug use: Not Currently    Types: IV    Comment: 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)   Sexual activity: Not on file  Other Topics Concern   Not on file  Social History Narrative   Not on file  Social Determinants of Health   Financial Resource Strain: Not on file  Food Insecurity: Not on file  Transportation Needs: Not on file  Physical Activity: Not on file  Stress: Not on file  Social Connections: Not on file  Intimate Partner Violence: Not on file    Family History:   Patient denies family history of cardiac disease  ROS:  Please see the history of present illness.  All other ROS reviewed and negative.     Physical Exam/Data:   Vitals:   04/01/21 0215 04/01/21 0330 04/01/21 0520 04/01/21 0728  BP: (!) 105/54 (!) 141/97 102/76  90/79  Pulse: 91 92 100 76  Resp: '20 18 20 17  '$ Temp:   97.9 F (36.6 C) 98.2 F (36.8 C)  TempSrc:   Oral Axillary  SpO2: 96% 98% 100% 94%  Weight:   47.1 kg   Height:   '5\' 11"'$  (1.803 m)     Intake/Output Summary (Last 24 hours) at 04/01/2021 0838 Last data filed at 04/01/2021 0540 Gross per 24 hour  Intake 1527.9 ml  Output --  Net 1527.9 ml   Last 3 Weights 04/01/2021 03/31/2021 03/30/2021  Weight (lbs) 103 lb 13.4 oz 104 lb 104 lb 14.4 oz  Weight (kg) 47.1 kg 47.174 kg 47.582 kg     Body mass index is 14.48 kg/m.  General: Thin frail elderly appearing male than stated age in no acute distress HEENT: normal Neck: no JVD Vascular: No carotid bruits; Distal pulses 2+ bilaterally Cardiac:  normal S1, S2; RRR; no murmur  Lungs:  clear to auscultation bilaterally, no wheezing, rhonchi or rales  Abd: soft, nontender, no hepatomegaly  Ext: no edema Musculoskeletal:  No deformities, BUE and BLE strength normal and equal Skin: warm and dry  Neuro:  CNs 2-12 intact, no focal abnormalities noted Psych:  Normal affect   EKG:  The EKG was personally reviewed and demonstrates: Sinus rhythm, prolonged PR interval, nonspecific ST/T wave changes Telemetry:  Telemetry was personally reviewed and demonstrates: Sinus rhythm/tachycardia  Relevant CV Studies: Pending echocardiogram  Laboratory Data:  High Sensitivity Troponin:   Recent Labs  Lab 03/31/21 2104 03/31/21 2336 04/01/21 0612  TROPONINIHS 897* 1,456* 1,512*     Chemistry Recent Labs  Lab 03/30/21 0856 03/31/21 2104 04/01/21 0612  NA 137 130* 137  K 4.7 3.2* 4.1  CL 96* 93* 101  CO2 32 28 25  GLUCOSE 106* 88 113*  BUN 26* 19 9  CREATININE 0.92 0.63 0.72  CALCIUM 10.7* 9.1 9.0  MG  --   --  1.8  GFRNONAA >60 >60 >60  ANIONGAP '9 9 11    '$ Recent Labs  Lab 03/30/21 0856 03/31/21 2104  PROT 9.1* 9.0*  ALBUMIN 4.3 4.0  AST 88* 173*  ALT 39 52*  ALKPHOS 73 66  BILITOT 0.6 0.7   Hematology Recent Labs  Lab  03/30/21 0856 03/31/21 2104 04/01/21 0612  WBC 11.7* 24.4* 11.8*  RBC 4.55 4.46 3.53*  HGB 14.0 13.7 11.1*  HCT 41.7 41.0 32.0*  MCV 91.6 91.9 90.7  MCH 30.8 30.7 31.4  MCHC 33.6 33.4 34.7  RDW 14.5 14.1 14.0  PLT 308 297 202   Radiology/Studies:  CT Soft Tissue Neck W Contrast  Result Date: 03/31/2021 CLINICAL DATA:  Stage IV laryngeal cancer.  Stridor. EXAM: CT NECK WITH CONTRAST TECHNIQUE: Multidetector CT imaging of the neck was performed using the standard protocol following the bolus administration of intravenous contrast. RADIATION DOSE REDUCTION: This exam was performed  according to the departmental dose-optimization program which includes automated exposure control, adjustment of the mA and/or kV according to patient size and/or use of iterative reconstruction technique. CONTRAST:  44m OMNIPAQUE IOHEXOL 300 MG/ML  SOLN COMPARISON:  CT neck 11/20/2020 FINDINGS: Examination is severely degraded by motion. Pharynx and larynx: There is a lobulated mass of the larynx, predominantly right-sided. This is largely obscured by motion but appears unchanged. The airway is widely patent. Salivary glands: No inflammation, mass, or stone. Thyroid: Normal. Lymph nodes: Partially necrotic mass of the right neck has slightly decreased in size from 3.2 cm 2 2.3 cm. No other lymph nodes are visualized. Vascular: Calcific aortic atherosclerosis. Limited intracranial: Negative. Visualized orbits: Negative. Mastoids and visualized paranasal sinuses: Clear. Skeleton: No acute or aggressive process. Upper chest: Hazy opacity at the right lung apex, favored to be scarring. Other: None. IMPRESSION: 1. Severely motion degraded study.  No airway compromise. 2. Unchanged appearance of lobulated mass of the larynx, within limitations imposed by the degree of motion. 3. Slight decrease in size of partially necrotic mass of the right neck. No other lymph nodes are visualized. Aortic Atherosclerosis (ICD10-I70.0).  Electronically Signed   By: KUlyses JarredM.D.   On: 03/31/2021 22:09   DG Chest Port 1 View  Result Date: 04/01/2021 CLINICAL DATA:  46year old male with history of respiratory distress. EXAM: PORTABLE CHEST 1 VIEW COMPARISON:  Chest x-ray 03/31/2021. FINDINGS: Patchy ill-defined opacities and areas of interstitial prominence are again noted throughout the right mid to lower lung. Blunting of the right costophrenic sulcus again noted. Left lung is clear. No left pleural effusion. No pneumothorax. No evidence of pulmonary edema. Heart size is normal. Upper mediastinal contours are within normal limits. IMPRESSION: 1. Findings remain concerning for probable bronchopneumonia in the right middle and potentially right lower lobes. 2. Small right parapneumonic pleural effusion. Electronically Signed   By: DVinnie LangtonM.D.   On: 04/01/2021 07:06   DG Chest Port 1 View  Result Date: 03/31/2021 CLINICAL DATA:  Shortness of breath. EXAM: PORTABLE CHEST 1 VIEW COMPARISON:  Chest x-ray with ribs 02/18/2019. FINDINGS: There is some hazy and patchy opacities in the right middle lobe. Left lung is clear. Staple line is again noted in the medial left upper lung. No pleural effusion or pneumothorax. Cardiomediastinal silhouette is within normal limits. IMPRESSION: Hazy and patchy opacities in the right middle lobe worrisome for infection. Followup PA and lateral chest X-ray is recommended in 3-4 weeks following trial of antibiotic therapy to ensure resolution and exclude underlying malignancy. Electronically Signed   By: ARonney AstersM.D.   On: 03/31/2021 20:57     Assessment and Plan:   Elevated troponin -CRP 8.5 -Hs-Troponin 897>>1456>>1512 -Patient denies chest pain or pleuritic symptoms -Likely demand ischemia in setting of sepsis secondary to pneumonia -Agree with echo.  Will not plan any further work-up if no wall motion abnormality  2.  Polysubstance abuse -Recommended cessation - On Methadone    3.  Sepsis secondary to pneumonia -Per admitting team  4.  Laryngeal cancer on chemoradiation - CT of neck: IMPRESSION: 1. Severely motion degraded study.  No airway compromise. 2. Unchanged appearance of lobulated mass of the larynx, within limitations imposed by the degree of motion. 3. Slight decrease in size of partially necrotic mass of the right neck. No other lymph nodes are visualized.  Risk Assessment/Risk Scores:   TIMI Risk Score for Unstable Angina or Non-ST Elevation MI:   The patient's TIMI risk score is  1, which indicates a 5% risk of all cause mortality, new or recurrent myocardial infarction or need for urgent revascularization in the next 14 days.{   For questions or updates, please contact [place] Please consult [email or web address] for contact info under    Jarrett Soho, PA  04/01/2021 8:38 AM   I have seen and examined the patient along with Leanor Kail, PA .  I have reviewed the chart, notes and new data.  I agree with PA/NP's note.  Key new complaints: Denies chest pain.  He is not having any trouble with dyspnea at rest.  He has had a terrible year from any point of view and becomes teary-eyed just thinking about it. Key examination changes: Severely malnourished/underweight.  Appears chronically ill, older than stated age.  Normal cardiovascular exam.  Hoarseness. Key new findings / data: Reviewed bedside echo, which shows severely depressed left ventricular systolic function with a EF less than 30% and apical akinesis/mild dyskinesis.  Most likely represents Takotsubo syndrome, but cannot exclude LAD distribution infarction/ischemia.  He has evidence of atherosclerosis in the carotids, aortic arch and branches, aortoiliac distribution, on previous chest and abdomen CT performed for cancer staging.  PLAN: Takotsubo syndrome (most likely) versus LAD artery ischemia/infarction (cannot be excluded). Extensive evidence of atherosclerotic  vascular disease on imaging studies.  The wall motion abnormality is much more extensive than would be expected from the very mild increase in troponin. Thankfully, he does not have clinical findings to suggest decompensated heart failure.  Doppler findings do suggest increased mean left atrial pressure.  Plan cardiac catheterization tomorrow.  The diagnostic procedure as well as possible angioplasty-stent has been fully reviewed with the patient and informed consent has been obtained.  Check lipid profile.  He is so severely malnourished that his cholesterol might be low, but otherwise we will plan to start statin. Blood pressure is borderline low.  We will hold off on beta-blockers for now.  Sanda Klein, MD, [place] [phone] 04/01/2021, 9:35 AM

## 2021-04-01 NOTE — Assessment & Plan Note (Signed)
Mild. ?-IV fluid hydration, continue to monitor ?

## 2021-04-01 NOTE — ED Notes (Addendum)
On walking into the pt.'s room, there was a burning smell. Pt. States that he tried to light a cigarette in the room, but the smell was too strong. Pt.'s Cigarettes were taken from him and secured at the nurses station. He was told that smoking was not allowed in the hospital. Pt. Pulled out his IV in the process of "trying to put out the lit cigarette." ?

## 2021-04-01 NOTE — Sepsis Progress Note (Signed)
[facility]link following for Sepsis Protocol, 2nd BC noted to be unable to be attained as seen in progress notes ?

## 2021-04-01 NOTE — Progress Notes (Signed)
?  Echocardiogram ?2D Echocardiogram has been performed. ? ?David [name] ?04/01/2021, 9:10 AM ?

## 2021-04-01 NOTE — Sepsis Progress Note (Signed)
Secured Cytogeneticist took place with current ED RN, 2nd LA will have to be redrawn, pt has been very difficult stick for labs and IV ?

## 2021-04-01 NOTE — Progress Notes (Signed)
Patient arrived to 2C11 from [facility].  Patient found with green powder substance wrapped in receipt in his sock.  Patient kept saying "don't take my money, that's money in my sock". Clothing put in patient belongings bag at bedside. Cigarettes, lighter and green substance confiscated.  ?

## 2021-04-01 NOTE — Discharge Summary (Signed)
AMA  Patient left AMA with mother, mother had delivered cigarettes to him, RN per policy requested to move the cigarettes, he became increasingly agitated and left with his family.    Lala Lund M.D on 04/01/2021 at 1:20 PM  [facility]  Time < 30 minutes   Last Note Below                                                                     PROGRESS NOTE                                                                                                                                                                                                             Patient Demographics:    David [name], is a 46 y.o. 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  Came to the ER at [facility] long where he was found to have an elevated troponin and pneumonia.  He was admitted and transferred to [facility] for further work-up.   Subjective:    David [name] today has, No headache, No chest pain, No abdominal pain - No Nausea, No new weakness tingling or numbness, no shortness of breath, feels better - no pain anywhere.   Assessment  & Plan :    Mild NSTEMI due to CAP >> hypotension and demand mismatch.  EKG nonacute, chest pain-free, currently on aspirin and  Lovenox, blood pressure too low for  beta-blocker.  We will also add statin.  Cardiology team to evaluate soon.  Will defer further management to cardiology, currently stable, no anxiety currently.  Echocardiogram pending. UDS positive for amphetamines.  CAP.  Suspicion for aspiration pneumonia.  Currently on empiric antibiotics.  Improving, speech to follow.  PPI added.  Stage IV laryngeal cancer.  CT soft tissue neck nonacute, outpatient follow-up with his ENT physician Dr. Isaias Cowman and oncologist Dr. Purcell Mouton.  Last chemotherapy 4 weeks ago.  Dehydration with hyponatremia and hypokalemia with hypotension.  IV fluids, sodium levels have improved, potassium replaced.  History of substance abuse and narcotic abuse with breaking of pain contract in the past.  Outpatient follow-up with his pain clinic, home dose methadone.  Currently in no distress.  Is positive for amphetamines.  Counseled.  History of smoking.  Continue NicoDerm patch.  Mild baseline hypotension.  Low-dose steroids and midodrine as needed along with IV fluids.  Asymptomatic transaminitis.  Likely due to transient acute on chronic hypotension, hydrate with fluids, as needed midodrine, IV Decadron low-dose for now and monitor.  Panic attack after argument with mother.  Resolved.  Feels safe going home.  Supportive care for now.  Already hypotensive and takes methadone at high dose.  We are avoiding further benzodiazepines.  Outpatient psych follow-up.      Condition - Extremely Guarded  Family Communication  :  none present  Code Status :  Full  Consults  :  Cards  PUD Prophylaxis : PPI   Procedures  :     CT - 1. Severely motion degraded study.  No airway compromise. 2. Unchanged appearance of lobulated mass of the larynx, within limitations imposed by the degree of motion. 3. Slight decrease in size of partially necrotic mass of the right neck. No other lymph nodes are visualized. Aortic Atherosclerosis (ICD10-I70.0).       Disposition Plan  :     Status is: Inpatient  DVT Prophylaxis  :  Lovenox    Lab Results  Component Value Date   PLT 202 04/01/2021    Diet :  Diet Order             DIET DYS 3 Room service appropriate? Yes; Fluid consistency: Nectar Thick  Diet effective now                    Inpatient Medications  Scheduled Meds:  (feeding supplement) PROSource Plus  30 mL Oral BID BM   aspirin EC  81 mg Oral Daily   dexamethasone (DECADRON) injection  8 mg Intravenous Q24H   enoxaparin (LOVENOX) injection  50 mg Subcutaneous Once   enoxaparin (LOVENOX) injection  50 mg Subcutaneous Once   [START ON 04/02/2021] enoxaparin (LOVENOX) injection  50 mg Subcutaneous Q12H   guaiFENesin  1,200 mg Oral BID   methadone  60 mg Oral Daily   nicotine  14 mg Transdermal Once   pantoprazole  40 mg Oral QHS   sodium chloride flush  3 mL Intravenous Q12H   Continuous Infusions:  ampicillin-sulbactam (UNASYN) IV     azithromycin     lactated ringers with kcl 75 mL/hr at 04/01/21 0757   PRN Meds:.  Time Spent in minutes  30   Lala Lund M.D on 04/01/2021 at 1:20 PM  To page go to [email or web address]   [facility] -  Office  [phone]  See all Orders from today for further details    Objective:  Vitals:   04/01/21 0215 04/01/21 0330 04/01/21 0520 04/01/21 0728  BP: (!) 105/54 (!) 141/97 102/76 90/79  Pulse: 91 92 100 76  Resp: '20 18 20 17  '$ Temp:   97.9 F (36.6 C) 98.2 F (36.8 C)  TempSrc:   Oral Axillary  SpO2: 96% 98% 100% 94%  Weight:   47.1 kg   Height:   '5\' 11"'$  (1.803 m)     Wt Readings from Last 3 Encounters:  04/01/21 47.1 kg  03/30/21 47.6 kg  03/21/21 49.9 kg     Intake/Output Summary (Last 24 hours) at 04/01/2021 1320 Last data filed at 04/01/2021 0540 Gross per 24 hour  Intake 1527.9 ml  Output --  Net 1527.9 ml     Physical Exam  Awake Alert, No new F.N deficits, Normal affect [place].AT,PERRAL Supple Neck, No JVD,   Symmetrical Chest wall movement, Good air movement  bilaterally, CTAB RRR,No Gallops,Rubs or new Murmurs,  +ve B.Sounds, Abd Soft, No tenderness,   No Cyanosis, Clubbing or edema      Data Review:    CBC Recent Labs  Lab 03/30/21 0856 03/31/21 2104 04/01/21 0612  WBC 11.7* 24.4* 11.8*  HGB 14.0 13.7 11.1*  HCT 41.7 41.0 32.0*  PLT 308 297 202  MCV 91.6 91.9 90.7  MCH 30.8 30.7 31.4  MCHC 33.6 33.4 34.7  RDW 14.5 14.1 14.0  LYMPHSABS 0.8 0.3*  --   MONOABS 1.3* 1.2*  --   EOSABS 0.1 0.0  --   BASOSABS 0.1 0.1  --     Electrolytes Recent Labs  Lab 03/30/21 0856 03/31/21 2104 03/31/21 2108 04/01/21 0324 04/01/21 0612  NA 137 130*  --   --  137  K 4.7 3.2*  --   --  4.1  CL 96* 93*  --   --  101  CO2 32 28  --   --  25  GLUCOSE 106* 88  --   --  113*  BUN 26* 19  --   --  9  CREATININE 0.92 0.63  --   --  0.72  CALCIUM 10.7* 9.1  --   --  9.0  AST 88* 173*  --   --   --   ALT 39 52*  --   --   --   ALKPHOS 73 66  --   --   --   BILITOT 0.6 0.7  --   --   --   ALBUMIN 4.3 4.0  --   --   --   MG  --   --   --   --  1.8  CRP  --   --   --   --  8.5*  LATICACIDVEN  --   --  2.4* 2.1*  --   INR  --  1.2  --   --   --   BNP  --   --   --   --  276.6*    ------------------------------------------------------------------------------------------------------------------ Recent Labs    04/01/21 0612  CHOL 98  HDL 35*  LDLCALC 55  TRIG 40  CHOLHDL 2.8    No results found for: HGBA1C  No results for input(s): TSH, T4TOTAL, T3FREE, THYROIDAB in the last 72 hours.  Invalid input(s): FREET3 ------------------------------------------------------------------------------------------------------------------ ID Labs Recent Labs  Lab 03/30/21 0856 03/31/21 2104 03/31/21 2108 04/01/21 0324 04/01/21 0612  WBC 11.7* 24.4*  --   --  11.8*  PLT 308 297  --   --  202  CRP  --   --   --   --  8.5*  LATICACIDVEN  --   --  2.4* 2.1*  --   CREATININE 0.92 0.63  --   --  0.72   Cardiac Enzymes No results for  input(s): CKMB, TROPONINI, MYOGLOBIN in the last 168 hours.  Invalid input(s): CK   Radiology Reports CT Soft Tissue Neck W Contrast  Result Date: 03/31/2021 CLINICAL DATA:  Stage IV laryngeal cancer.  Stridor. EXAM: CT NECK WITH CONTRAST TECHNIQUE: Multidetector CT imaging of the neck was performed using the standard protocol following the bolus administration of intravenous contrast. RADIATION DOSE REDUCTION: This exam was performed according to the departmental dose-optimization program which includes automated exposure control, adjustment of the mA and/or kV according to patient size and/or use of iterative reconstruction technique. CONTRAST:  71m OMNIPAQUE IOHEXOL 300 MG/ML  SOLN COMPARISON:  CT neck 11/20/2020 FINDINGS: Examination is severely degraded by motion. Pharynx and larynx: There is a lobulated mass of the larynx, predominantly right-sided. This is largely obscured by motion but appears unchanged. The airway is widely patent. Salivary glands: No inflammation, mass, or stone. Thyroid: Normal. Lymph nodes: Partially necrotic mass of the right neck has slightly decreased in size from 3.2 cm 2 2.3 cm. No other lymph nodes are visualized. Vascular: Calcific aortic atherosclerosis. Limited intracranial: Negative. Visualized orbits: Negative. Mastoids and visualized paranasal sinuses: Clear. Skeleton: No acute or aggressive process. Upper chest: Hazy opacity at the right lung apex, favored to be scarring. Other: None. IMPRESSION: 1. Severely motion degraded study.  No airway compromise. 2. Unchanged appearance of lobulated mass of the larynx, within limitations imposed by the degree of motion. 3. Slight decrease in size of partially necrotic mass of the right neck. No other lymph nodes are visualized. Aortic Atherosclerosis (ICD10-I70.0). Electronically Signed   By: KUlyses JarredM.D.   On: 03/31/2021 22:09   DG Chest Port 1 View  Result Date: 04/01/2021 CLINICAL DATA:  46year old male with  history of respiratory distress. EXAM: PORTABLE CHEST 1 VIEW COMPARISON:  Chest x-ray 03/31/2021. FINDINGS: Patchy ill-defined opacities and areas of interstitial prominence are again noted throughout the right mid to lower lung. Blunting of the right costophrenic sulcus again noted. Left lung is clear. No left pleural effusion. No pneumothorax. No evidence of pulmonary edema. Heart size is normal. Upper mediastinal contours are within normal limits. IMPRESSION: 1. Findings remain concerning for probable bronchopneumonia in the right middle and potentially right lower lobes. 2. Small right parapneumonic pleural effusion. Electronically Signed   By: DVinnie LangtonM.D.   On: 04/01/2021 07:06   DG Chest Port 1 View  Result Date: 03/31/2021 CLINICAL DATA:  Shortness of breath. EXAM: PORTABLE CHEST 1 VIEW COMPARISON:  Chest x-ray with ribs 02/18/2019. FINDINGS: There is some hazy and patchy opacities in the right middle lobe. Left lung is clear. Staple line is again noted in the medial left upper lung. No pleural effusion or pneumothorax. Cardiomediastinal silhouette is within normal limits. IMPRESSION: Hazy and patchy opacities in the right middle lobe worrisome for infection. Followup PA and lateral chest X-ray is recommended in 3-4 weeks following trial of antibiotic therapy to ensure resolution and exclude underlying malignancy. Electronically Signed   By: ARonney AstersM.D.   On: 03/31/2021 20:57   ECHOCARDIOGRAM COMPLETE  Result Date: 04/01/2021    ECHOCARDIOGRAM REPORT   Patient Name:   David SALGUERODate of Exam: 04/01/2021 Medical Rec #:  [id number]  Height:       71.0 in Accession #:    [id number]           Weight:       103.8 lb Date of Birth:  [birth year]            BSA:          1.596 m Patient Age:    48 years             BP:           90/79 mmHg Patient Gender: M                    HR:           78 bpm. Exam Location:  Inpatient Procedure: 2D Echo, Cardiac Doppler, Color Doppler and  Intracardiac            Opacification Agent STAT ECHO Indications:    Elevated troponin  History:        Patient has no prior history of Echocardiogram examinations.                 Signs/Symptoms:Shortness of Breath. Polysubstance abuse. Hx                 stage IV laryngeal cancer and chemotherapy.  Sonographer:    Clayton Lefort RDCS (AE) Referring Phys: [id number] [facility]  Sonographer Comments: Suboptimal parasternal window and suboptimal apical window. Image acquisition challenging due to respiratory motion. Dr. Dani Gobble Croitoru at bedside during echo. IMPRESSIONS  1. Left ventricular ejection fraction, by estimation, is 25 to 30%. The left ventricle has severely decreased function. The left ventricle demonstrates regional wall motion abnormalities (see scoring diagram/findings for description). Left ventricular diastolic parameters are consistent with Grade II diastolic dysfunction (pseudonormalization). Elevated left atrial pressure.  2. Right ventricular systolic function is normal. The right ventricular size is normal.  3. The mitral valve is normal in structure. Mild to moderate mitral valve regurgitation.  4. The aortic valve is tricuspid. Aortic valve regurgitation is not visualized. No aortic stenosis is present.  5. The inferior vena cava is normal in size with greater than 50% respiratory variability, suggesting right atrial pressure of 3 mmHg. Comparison(s): No prior Echocardiogram. 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 FINDINGS  Left Ventricle: There is no left ventricular thrombus (Definity contrast used). Left ventricular ejection fraction, by estimation, is 25 to 30%. The left ventricle has severely decreased function. The left ventricle demonstrates regional wall motion abnormalities. Definity contrast agent was given IV to delineate the left ventricular  endocardial borders. The left ventricular internal cavity size was normal in size. There is no left ventricular hypertrophy. Left ventricular diastolic parameters are consistent with Grade II diastolic dysfunction (pseudonormalization). Elevated left atrial pressure.  LV Wall Scoring: The apical lateral segment, apical anterior segment, and apex are dyskinetic. The mid and distal anterior septum, mid and distal inferior wall, mid inferoseptal segment, and mid anterior segment are akinetic. The antero-lateral wall, posterior wall, basal anteroseptal segment, basal anterior segment, basal inferior segment, and basal inferoseptal segment are normal. Right Ventricle: The right ventricular size is normal. No increase in right ventricular wall thickness. Right ventricular systolic function is normal. Left Atrium: Left atrial size was normal in size. Right Atrium: Right atrial size was normal in size. Pericardium: There is no evidence of pericardial effusion. Mitral Valve: The mitral valve is normal in structure. Mild  to moderate mitral valve regurgitation. Tricuspid Valve: The tricuspid valve is normal in structure. Tricuspid valve regurgitation is not demonstrated. Aortic Valve: The aortic valve is tricuspid. Aortic valve regurgitation is not visualized. No aortic stenosis is present. Aortic valve mean gradient measures 2.0 mmHg. Aortic valve peak gradient measures 3.8 mmHg. Aortic valve area, by VTI measures 2.15 cm. Pulmonic Valve: The pulmonic valve was grossly normal. Pulmonic valve regurgitation is not visualized. Aorta: The aortic root is normal in size and structure. Venous: The inferior vena cava is normal in size with greater than 50% respiratory variability, suggesting right atrial pressure of 3 mmHg. IAS/Shunts: No atrial level shunt detected by color flow Doppler.  LEFT VENTRICLE PLAX 2D LVIDd:         5.40 cm   Diastology LVIDs:         3.90 cm   LV e' medial:    7.18 cm/s LV PW:         0.80 cm   LV E/e'  medial:  13.7 LV IVS:        0.70 cm   LV e' lateral:   9.03 cm/s LVOT diam:     1.80 cm   LV E/e' lateral: 10.9 LV SV:         39 LV SV Index:   24 LVOT Area:     2.54 cm  RIGHT VENTRICLE             IVC RV Basal diam:  2.20 cm     IVC diam: 1.80 cm RV S prime:     10.90 cm/s TAPSE (M-mode): 1.4 cm LEFT ATRIUM           Index        RIGHT ATRIUM          Index LA diam:      3.20 cm 2.01 cm/m   RA Area:     9.29 cm LA Vol (A2C): 17.8 ml 11.15 ml/m  RA Volume:   19.20 ml 12.03 ml/m LA Vol (A4C): 25.5 ml 15.98 ml/m  AORTIC VALVE AV Area (Vmax):    2.29 cm AV Area (Vmean):   2.21 cm AV Area (VTI):     2.15 cm AV Vmax:           97.00 cm/s AV Vmean:          66.200 cm/s AV VTI:            0.181 m AV Peak Grad:      3.8 mmHg AV Mean Grad:      2.0 mmHg LVOT Vmax:         87.40 cm/s LVOT Vmean:        57.500 cm/s LVOT VTI:          0.153 m LVOT/AV VTI ratio: 0.85  AORTA Ao Root diam: 3.00 cm MITRAL VALVE MV Area (PHT): 4.36 cm    SHUNTS MV Decel Time: 174 msec    Systemic VTI:  0.15 m MV E velocity: 98.50 cm/s  Systemic Diam: 1.80 cm MV A velocity: 42.40 cm/s MV E/A ratio:  2.32 Mihai Croitoru MD Electronically signed by Sanda Klein MD Signature Date/Time: 04/01/2021/12:42:19 PM    Final

## 2021-04-01 NOTE — Progress Notes (Signed)
ANTICOAGULATION CONSULT NOTE - Initial Consult ? ?Pharmacy Consult for Lovenox ?Indication: chest pain/ACS ? ?No Known Allergies ? ?Patient Measurements: ?Height: '5\' 11"'$  (180.3 cm) ?Weight: 47.2 kg (104 lb) ?IBW/kg (Calculated) : 75.3 ? ?Vital Signs: ?Temp: 99.2 ?F (37.3 ?C) (03/04 1945) ?Temp Source: Oral (03/04 1945) ?BP: 105/54 (03/05 0215) ?Pulse Rate: 91 (03/05 0215) ? ?Labs: ?Recent Labs  ?  03/30/21 ?0856 03/31/21 ?2104 03/31/21 ?2336  ?HGB 14.0 13.7  --   ?HCT 41.7 41.0  --   ?PLT 308 297  --   ?APTT  --  33  --   ?LABPROT  --  14.7  --   ?INR  --  1.2  --   ?CREATININE 0.92 0.63  --   ?TROPONINIHS  --  897* 1,456*  ? ? ?Estimated Creatinine Clearance: 77 mL/min (by C-G formula based on SCr of 0.63 mg/dL). ? ? ?Medical History: ?Past Medical History:  ?Diagnosis Date  ? 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 [facility])   ? Pneumothorax   ? Left lung spontaneous pneumothorax at age 41 yr   ? ? ?Medications:  ? ?Assessment: ?46 yo M with shortness of breath and increasing troponin probable infarct per EKG.   ?Pharmacy consulted for Lovenox dosing.  ?CBC, Scr WNL.   ? ?Goal of Therapy:  ?Anti-Xa level 0.6-1 units/ml 4hrs after LMWH dose given ?Monitor platelets by anticoagulation protocol: Yes ?  ?Plan:  ?Lovenox '50mg'$  sq q12h ?CBC at least q72h while on Lovenox ?Monitor renal fxn and s/sx of bleeding  ?F/U cardiology recommendations ? ?Netta Cedars PharmD ?04/01/2021,3:11 AM ? ? ?

## 2021-04-01 NOTE — Assessment & Plan Note (Signed)
Mild. ?-Monitor potassium and magnesium levels, replace if low. ?

## 2021-04-01 NOTE — Assessment & Plan Note (Deleted)
Patient with history of heroin abuse and is on methadone. ?-Continue home methadone ?

## 2021-04-01 NOTE — Evaluation (Signed)
Clinical/Bedside Swallow Evaluation ?Patient Details  ?Name: David [name] ?MRN: [id number] ?Date of Birth: [birth year] ? ?Today's Date: 04/01/2021 ?Time: SLP Start Time (ACUTE ONLY): 9163 SLP Stop Time (ACUTE ONLY): 8466 ?SLP Time Calculation (min) (ACUTE ONLY): 20 min ? ?Past Medical History:  ?Past Medical History:  ?Diagnosis Date  ? 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 [facility])   ? Pneumothorax   ? Left lung spontaneous pneumothorax at age 89 yr   ? ?Past Surgical History:  ?Past Surgical History:  ?Procedure Laterality Date  ? chest tubes Left   ? 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 Found to have an elevated troponin and pneumonia.  Pt sees SLP at outpatient who specializes in head and neck cancer. Had MBS 12/2020 recommended D3/nectar due to silent and audible aspiration of thin.  ?  ?Assessment / Plan / Recommendation  ?Clinical Impression ? Pt present with a strong volitional cough, breathy vocal quality as he reports recent vocal strain (arguing/yelling with him mother) and that typically vocal quality is not too altered. He has wheezing/stridor as his baseline as well. PTA he was recommended to thicken all liquids however has not been doing so (Ensure, and thin Coke etc). Today he demonstrated a suspected weak pharyngeal swallow, multiple swallows, immediate and delayed throat clear primarily with thin. Orally, his dentition is complete but poor in quality. Dys 3/nectar texture initiated and MBS as soon as able. He will be NPO tomorrow as he is having a cath. Crush pills, small sips/bites. ST will follow. ?SLP Visit Diagnosis: Dysphagia, unspecified (R13.10) ?   ?Aspiration Risk ? Moderate aspiration risk  ?  ?Diet Recommendation Dysphagia 3 (Mech soft);Nectar-thick liquid   ? ?Liquid Administration via: Straw;Cup ?Medication Administration: Crushed with puree ?Supervision: Patient able to self feed;Intermittent supervision to cue for compensatory strategies ?Compensations: Slow rate;Small sips/bites ?Postural Changes: Seated upright at 90 degrees  ?  ?Other  Recommendations Oral Care Recommendations: Oral care BID   ? ?Recommendations for follow up therapy are one component of a multi-disciplinary discharge planning process, led by the attending physician.  Recommendations may be updated based on patient status, additional functional criteria and insurance authorization. ? ?Follow up Recommendations  (TBD)  ? ? ?  ?Assistance Recommended at Discharge    ?Functional Status Assessment Patient has had a recent decline in their functional status and demonstrates the ability to make significant improvements in function in a reasonable and predictable amount of time.  ?Frequency and Duration min 2x/week  ?2 weeks ?  ?   ? ?Prognosis Prognosis for Safe Diet Advancement: Good  ? ?  ? 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 Found to have an elevated troponin and pneumonia.  Pt sees SLP at outpatient who specializes in head and neck cancer. Had MBS 12/2020 recommended D3/nectar due to silent and audible aspiration of thin. ?Type of Study: Bedside Swallow Evaluation ?Previous Swallow Assessment:  (see HPI) ?Diet Prior to this Study: NPO ?Temperature Spikes Noted: No ?Respiratory Status: Room air ?History of Recent Intubation: No ?Behavior/Cognition: Alert;Pleasant mood;Cooperative ?Oral Cavity Assessment: Within  Functional Limits ?Oral Care Completed by SLP: No ?Oral Cavity - Dentition: Poor condition ?Vision: Functional for self-feeding ?Self-Feeding  Abilities: Able to feed self ?Patient Positioning: Upright in bed ?Baseline Vocal Quality: Breathy (respiratory wheezing as well) ?Volitional Cough: Strong ?Volitional Swallow: Able to elicit  ?  ?Oral/Motor/Sensory Function Overall Oral Motor/Sensory Function: Within functional limits   ?Ice Chips Ice chips: Not tested   ?Thin Liquid Thin Liquid: Impaired ?Presentation: Cup;Straw ?Oral Phase Impairments:  (none) ?Oral Phase Functional Implications:  (none) ?Pharyngeal  Phase Impairments: Multiple swallows;Throat Clearing - Immediate;Throat Clearing - Delayed;Decreased hyoid-laryngeal movement (weak in appearance)  ?  ?Nectar Thick Nectar Thick Liquid: Not tested   ?Honey Thick Honey Thick Liquid: Not tested   ?Puree Puree: Impaired ?Pharyngeal Phase Impairments: Decreased hyoid-laryngeal movement;Multiple swallows   ?Solid ? ? ?  Solid: Within functional limits  ? ?  ? ?[name] ?04/01/2021,9:55 AM ? ? ?[name] Frostproof.Ed CCC-SLP ?Speech-Language Pathologist ?Pager [phone] ?Office [phone] ? ?

## 2021-04-01 NOTE — Progress Notes (Signed)
PT Cancellation Note ? ?Patient Details ?Name: David [name] ?MRN: [id number] ?DOB: May 26, 1975 ? ? ?Cancelled Treatment:    Reason Eval/Treat Not Completed: Other (comment). OT reporting seeing pt run after family to leave building. If pt stays, will attempt later as time permits. ? ? ?Moishe Spice, PT, DPT ?Acute Rehabilitation Services  ?Pager: [phone] ?Office: [phone] ? ? ? ?Maretta Bees Pettis ?04/01/2021, 12:07 PM ? ? ?

## 2021-04-02 ENCOUNTER — Telehealth: Payer: Self-pay | Admitting: Hematology and Oncology

## 2021-04-02 ENCOUNTER — Other Ambulatory Visit: Payer: Medicaid Other

## 2021-04-02 ENCOUNTER — Telehealth: Payer: Self-pay | Admitting: *Deleted

## 2021-04-02 NOTE — Telephone Encounter (Signed)
Scheduled appointment per 03/03 los. Left message.  ?

## 2021-04-02 NOTE — Telephone Encounter (Signed)
This RN received call from the pt's mother- David [name]- stating situation over the weekend where David [name] went to the ER per EMS- due to " not breathing well and then hallucinating ". ? ?David [name] states he was diagnosed with pneumonia and " and then there was something with his heart and they wanted to an echo and a heart catheter " ? ?She states before above could be done- pt became upset and demanded to leave ( AMA). ? ?She states she had a friend go out to find him- who located him in his tent off of [address]- and she was able to speak to him on the phone- he states he is " taking the bus this AM to get his methadone ". ? ?She is on her way now to the methadone clinic hoping to speak with him and get him to come back to her home. ? ?She is concerned due to his current medical issues. ? ?This RN gave her verbal support as well as to speak with someone at [facility] regarding issue. ?Discussed issue of health concerns most likely related to probable use of street drugs and or misuse of prior prescription medications. ? ?Informed her goal presently is David [name]' safety- which may be difficult due to his behavior and wanting to stay on the streets. ? ?Per end of call - David [name] states she will inform [facility] of above for possible resources as well as if pt is willing to come back to her home she can call this RN with update and possible other resources. ? ?This RN then called [facility] at [phone] and spoke with [place]- informed her of above. ? ?Will fax information to her at [phone]. ?

## 2021-04-04 ENCOUNTER — Ambulatory Visit: Payer: Medicaid Other | Attending: Radiation Oncology

## 2021-04-04 ENCOUNTER — Other Ambulatory Visit: Payer: Self-pay

## 2021-04-04 ENCOUNTER — Telehealth: Payer: Self-pay | Admitting: *Deleted

## 2021-04-04 DIAGNOSIS — R1310 Dysphagia, unspecified: Secondary | ICD-10-CM | POA: Insufficient documentation

## 2021-04-04 DIAGNOSIS — R490 Dysphonia: Secondary | ICD-10-CM | POA: Insufficient documentation

## 2021-04-04 NOTE — Patient Instructions (Addendum)
Signs of Aspiration Pneumonia  ? ?Chest pain/tightness ?Fever (can be low grade) ?Cough  ?With foul-smelling phlegm (sputum) ?With sputum containing pus or blood ?With greenish sputum ?Fatigue  ?Shortness of breath  ?Wheezing  ? ?**IF YOU HAVE THESE SIGNS, CONTACT YOUR DOCTOR OR GO TO THE EMERGENCY DEPARTMENT OR URGENT CARE AS SOON AS POSSIBLE**  ? ?======================================= ? ?I strongly suggest baby food - type ("pureed" like you stated today) foods and NECTAR liquids (consistency of tomato juice) until a swallowing test can be done. ? ? ? ?

## 2021-04-04 NOTE — Telephone Encounter (Signed)
This RN received VM from [employer] stating concern due to recent admission with pt leaving the hospital " and we need to get him back in there " ? ?This RN returned call and received [name]'s identified VM. ? ?This RN left message validating her concerns. ? ?Informed her if [name] is in her home she may be able to call EMS for his issues and proceed from there. ? ?If [name] is staying on the streets- this may be more difficult to arrange- note there is no documentation showing pt as incompetent that would allow her or others to make decisions for him- though there are medical concerns. ? She may want to talk with the counselors at [facility] for possible resources. ? ?This note will be forwarded to the Palliative team for review of communication. ?

## 2021-04-04 NOTE — Therapy (Signed)
[facility] [address] [place], [place], [address] Phone: [phone]   Fax:  [phone]  Speech Language Pathology Treatment  Patient Details  Name: David [name] MRN: [id number] Date of Birth: 1975/08/08 Referring Provider (SLP): [name], MD   Encounter Date: 04/04/2021   End of Session - 04/04/21 2320     Visit Number 4    Number of Visits 5    Date for SLP Re-Evaluation 05/30/21    Authorization Type Medicaid    SLP Start Time 1535    SLP Stop Time  63    SLP Time Calculation (min) 40 min    Activity Tolerance Patient tolerated treatment well   slight agitation after he coughed on first sip water            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   Subjective Assessment - 04/04/21 1549     Subjective Pt entered room with drink in styrofoam cup - dark thin liquid. Pt arrives today reporting to SLP that his mother does not trust him and that she drove him here today reportedly saying, "If this isn't the right place you're walking home."    Currently in Pain? No/denies                   ADULT SLP TREATMENT - 04/04/21 1545       General Information   Behavior/Cognition Cooperative;Alert;Pleasant mood      Treatment Provided   Treatment provided Dysphagia      Dysphagia Treatment   Temperature Spikes Noted No    Respiratory Status Room air    Treatment Methods Skilled observation;Therapeutic exercise;Patient/caregiver education;Compensation strategy training    Patient observed directly with PO's Yes    Type of PO's observed Thin liquids    Oral Phase Signs & Symptoms Prolonged mastication    Pharyngeal Phase  Signs & Symptoms Immediate throat clear;Immediate cough   on solids and liquids - this is worse than last month   Other treatment/comments Continued to perseverate for first 10 minutes of session about mother trying to keep him from going to the methadone clinic, and his frustration with his mother, and the med list in the hospital being incorrect with only 79m instead of 1021m Pt with inspiratory stridor today with mod hoarseness, states his voice and stridor "has been like that since me and my mom had our fight on Saturday." SLP questions validity of this explanation. ChGerald Stabsoes not appear to be in any type of respiratory distress though his stridor appears significant. Pt with BSE on 04-01-21 while admitted indicating immediate and delayed throat clear and not drinking thickened liquids at time of bedside. MBS was recommended however pt left prior to test being completed. Pt indicates today that his drink is soda from [employer], without thickener. SLP strongly reiterated pt should be thickening liquids now that he is having difficulty with thin liquids and ChGerald Stabscknowledged this. SLP provided pt with 4 honey thick packets of thickener telling ChGerald Stabse could mix with 6 oz of fluid to obtain nectar thick (instead of 4 oz on packet directions to obtain honey-thick).  SLP is suggesting MBS to referring MD Isidore Moos), adn told David [name] to eat dys I foods and adhere to nectar thick liquids until MBS can be completed. He voiced understanding. HEP was completed with initial cues. Pt states he does 2 of the 3 exercises 10 reps daily ("I don't really do that hard swallow one with the tongue pushing the top of my mouth"). He told SLP the [facility] had found him housing and he is waiting to move in. Lastly David [name] told SLP he only has two meds left from his script for antibiotics and inquires if he needs refills. I asked him to ask one of his MDs who currently follows him - no PCP is on file. I provided David [name] with  Drs Isidore Moos, [name], and [facility] general phone numbers.      Assessment / Recommendations / Plan   Plan Continue with current plan of care;Other (Comment)   modified barium swallow eval     Dysphagia Recommendations   Diet recommendations Dysphagia 1 (puree);Nectar-thick liquid    Medication Administration Crushed with puree    Compensations Small sips/bites      Progression Toward Goals   Progression toward goals Not progressing toward goals (comment)                                        BSE findings from 04-01-21:Pt present with a strong volitional cough, breathy vocal quality as he reports recent vocal strain (arguing/yelling with him mother) and that typically vocal quality is not too altered. He has wheezing/stridor as his baseline as well. PTA he was recommended to thicken all liquids however has not been doing so (Ensure, and thin Coke etc). Today he demonstrated a suspected weak pharyngeal swallow, multiple swallows, immediate and delayed throat clear primarily with thin. Orally, his dentition is complete but poor in quality. Dys 3/nectar texture initiated and MBS as soon as able. He will be NPO tomorrow as he is having a cath. Crush pills, small sips/bites. ST will follow.    SLP Education - 04/04/21 2319     Education Details dys I and nectar suggested until MBSS, rationale for SLP suggesting MBSS, overt s/sx aspiration PNA    Person(s) Educated Patient    Methods Explanation;Handout    Comprehension Verbalized understanding             Short Term Goals 03/01/21        SLP SHORT TERM GOAL #1      Title pt will complete HEP with occasional min A    Time      Period --   vists, for all STGs    Status Met           SLP SHORT TERM GOAL #2    Title pt will tell SLP why pt is completing HEP with modified independence    Time      Status Met           SLP SHORT TERM GOAL #3    Title pt will describe 3 overt s/s aspiration PNA with modified independence    Time       Status Met           SLP SHORT TERM GOAL #4    Title pt will tell SLP how a food journal could hasten return to a more normalized diet    Time     Status  deferred                     Long Term Goals 03/01/21                 SLP LONG TERM GOAL #1    Title pt will complete HEP with modified independence over two visits    Time 1   04-04-21    Period --   visits, for all LTGs    Status ongoing           SLP LONG TERM GOAL #2    Title pt will describe how to modify HEP over time, and the timeline associated with reduction in HEP frequency with modified independence over two sessions    Time 2    Status ongoing       Plan - 03/01/21      Clinical Impression Statement At this time pt swallowing is deemed [facility] with dys I diet / nectar liquids due to coughing and throat clearing with both dys 3 (cereal bar) and thin liquids (drink from [employer]). SLP reviewed pt's individualized HEP for dysphagia and pt completed each exercise on their own with initial A. Pt was admitted last week with suspected pneumonia - a modified barium swallow was suggested after BSE by SLP, but pt left AMA prior to it's completion. See daily note above for more details. Data indicate that pt's swallow ability could very well decline over time following conclusion of their radiation therapy due to muscle disuse atrophy and/or muscle fibrosis. Pt will cont to need to be seen by SLP in order to assess safety of PO intake, assess the need for recommending any objective swallow assessment, and ensuring pt correctly completes the individualized HEP.    Speech Therapy Frequency --   once approx every four weeks    Duration --   90 days/3 therapy sessions for this reporting period;  Overall plan of care to include approx 7 visits    Treatment/Interventions Aspiration precaution training;Pharyngeal strengthening exercises;Diet toleration management by SLP;Trials of upgraded texture/liquids;Patient/family education;SLP instruction  and feedback;Compensatory techniques    Potential to Achieve Goals Good; decr'd participation/lack of cooperation        Patient will benefit from skilled therapeutic intervention in order to improve the following deficits and impairments:   Dysphagia, unspecified type  Hoarseness of voice    Problem List Patient Active Problem List   Diagnosis Date Noted   CAP (community acquired pneumonia) 04/01/2021   Elevated troponin 04/01/2021   Elevated transaminase level 04/01/2021   Mucositis due to antineoplastic therapy 02/14/2021   Drug abuse and dependence ([place]) 01/15/2021   Weight loss, unintentional 01/15/2021   Protein-calorie malnutrition, severe 01/10/2021   Hypomagnesemia 01/09/2021   Hypokalemia 01/09/2021   Pancytopenia ([place]) 01/09/2021   Hypotension 01/08/2021   AKI (acute kidney injury) ([place]) 01/08/2021   Hyponatremia 01/08/2021   Leukopenia due to antineoplastic chemotherapy ([place]) 01/08/2021   SIRS (systemic inflammatory response syndrome) ([place]) 01/08/2021   Chemotherapy induced nausea and vomiting 01/01/2021   Goals of care, counseling/discussion 12/13/2020   Laryngeal cancer ([place]) 12/04/2020   Encounter for preoperative dental examination 12/04/2020   Teeth missing 12/04/2020   Caries 12/04/2020   Retained tooth root 12/04/2020   Chronic apical periodontitis 12/04/2020   Impacted third molar tooth 12/04/2020   Attrition, teeth excessive 12/04/2020   Chronic periodontitis 12/04/2020   Accretions on teeth 12/04/2020   Incipient enamel caries 12/04/2020    [place], CCC-SLP 04/04/2021, 11:21 PM  [facility] [address], [place], [place], [address] Phone: [phone]   Fax:  [phone]   Name: David [name] MRN: [id number] Date of Birth: 02-08-1975

## 2021-04-05 ENCOUNTER — Telehealth: Payer: Self-pay

## 2021-04-05 NOTE — Telephone Encounter (Signed)
Pt's mother, Nyoka Cowden called and requested we send abx in for pt's PNA and work out a way for pt to have heart cath o/p. [name] is aware MD is currently out of office and we encourage pt to go back to ED for eval. I spoke with pt who states he intends to come back, but needs to "straighten some things out first". Pt states Methadone dose is different in our system than what he is taking now, which is 100 mg. He states he will have documents faxed to [name]. He also states he needs to speak with his attorney because he is supposed to have court Tuesday. Pt was encouraged to go to ED and he states he will do it on his terms.  ?

## 2021-04-06 ENCOUNTER — Encounter (HOSPITAL_COMMUNITY): Payer: Self-pay | Admitting: Emergency Medicine

## 2021-04-06 ENCOUNTER — Other Ambulatory Visit: Payer: Self-pay

## 2021-04-06 ENCOUNTER — Other Ambulatory Visit (HOSPITAL_COMMUNITY): Payer: Medicaid Other | Admitting: Dentistry

## 2021-04-06 ENCOUNTER — Emergency Department (HOSPITAL_COMMUNITY)
Admission: EM | Admit: 2021-04-06 | Discharge: 2021-04-06 | Disposition: A | Discharge status: Home / Self Care | Payer: Medicaid Other | Attending: Emergency Medicine | Admitting: Emergency Medicine

## 2021-04-06 ENCOUNTER — Telehealth: Payer: Self-pay | Admitting: *Deleted

## 2021-04-06 ENCOUNTER — Emergency Department (HOSPITAL_COMMUNITY): Payer: Medicaid Other

## 2021-04-06 DIAGNOSIS — Z760 Encounter for issue of repeat prescription: Secondary | ICD-10-CM | POA: Diagnosis not present

## 2021-04-06 DIAGNOSIS — J189 Pneumonia, unspecified organism: Secondary | ICD-10-CM | POA: Insufficient documentation

## 2021-04-06 LAB — CULTURE, BLOOD (ROUTINE X 2)
Culture: NO GROWTH
Culture: NO GROWTH
Special Requests: ADEQUATE

## 2021-04-06 MED ORDER — AMOXICILLIN-POT CLAVULANATE 875-125 MG PO TABS: 1.0000 | ORAL_TABLET | Freq: Two times a day (BID) | ORAL | 0 refills | Status: DC

## 2021-04-06 NOTE — Telephone Encounter (Signed)
This RN received call from the pt's mother [name] stating " David [name] has agreed to go back to the hospital ". ? ?" What do we do " ? ?This RN informed her to proceed to the ER at [facility] and inform them the pt left AMA and is now returning. ? ?[name] verbalized understanding. ? ?

## 2021-04-06 NOTE — ED Triage Notes (Signed)
Patient coming from home. States was dx with pneumonia, left hospital before getting prescription for antibiotics. Here for those today.  ?

## 2021-04-06 NOTE — Discharge Instructions (Signed)
Call your primary care doctor or specialist as discussed in the next 2-3 days.   Return immediately back to the ER if:  Your symptoms worsen within the next 12-24 hours. You develop new symptoms such as new fevers, persistent vomiting, new pain, shortness of breath, or new weakness or numbness, or if you have any other concerns.  

## 2021-04-06 NOTE — ED Provider Notes (Signed)
?[facility] ?Provider Note ? ? ?CSN: [id number] ?Arrival date & time: 04/06/21  1414 ? ?  ? ?History ? ?Chief Complaint  ?Patient presents with  ? Medication Refill  ? ? ?David [name] is a 46 y.o. male. ? ?Patient with history of stage IV laryngeal cancer, recent admission, presents to ER chief complaint of requesting antibiotics.  He states that he was admitted last week but left AGAINST MEDICAL ADVICE 5 days ago.  He was told he had a pneumonia and required additional antibiotics.  Otherwise the patient states at home he has not had any chest pain.  No fevers no cough.  He was told he needed additional antibiotics and presents to the ER to get a prescription.  Patient states he does not want to be admitted to the hospital he has no new symptoms, denies any pain or shortness of breath. ? ? ?  ? ?Home Medications ?Prior to Admission medications   ?Medication Sig Start Date End Date Taking? Authorizing Provider  ?amoxicillin-clavulanate (AUGMENTIN) 875-125 MG tablet Take 1 tablet by mouth every 12 (twelve) hours. 04/06/21  Yes Luna Fuse, MD  ?clotrimazole (MYCELEX) 10 MG troche Take 1 tablet (10 mg total) by mouth 5 (five) times daily. Suck on these slowly. 02/26/21   [name], MD  ?dronabinol (MARINOL) 2.5 MG capsule Take 2.5 mg by mouth 2 (two) times daily before a meal. ?Patient not taking: Reported on 03/31/2021    [provider]  ?guaiFENesin (MUCINEX) 600 MG 12 hr tablet Take 2 tablets (1,200 mg total) by mouth 2 (two) times daily. 02/26/21   Pickenpack-Cousar, Carlena Sax, NP  ?lidocaine (XYLOCAINE) 2 % solution Patient: Mix 1part 2% viscous lidocaine, 1part H20. Swish/swallow 36m of diluted mixture, 382m before meals and '@bedtime'$ , up to QID prn soreness 02/26/21   SqEppie GibsonMD  ?methadone (DOLOPHINE) 10 MG/ML solution Take 6 mLs (60 mg total) by mouth daily. 01/08/21   Pickenpack-Cousar, AtCarlena SaxNP  ?nicotine (NICODERM CQ - DOSED IN MG/24  HOURS) 14 mg/24hr patch Place 1 patch (14 mg total) onto the skin daily. Apply 21 mg patch daily x 6 wk, then '14mg'$  patch daily x 2 wk, then 7 mg patch daily x 2 wk 02/26/21   SqEppie GibsonMD  ?ondansetron (ZOFRAN) 8 MG tablet Take 1 tablet (8 mg total) by mouth every 8 (eight) hours as needed for nausea or vomiting. Starting day 3 after chemotherapy 01/01/21   Heilingoetter, Cassandra L, PA-C  ?traMADol (ULTRAM) 50 MG tablet Take 1 tablet (50 mg total) by mouth 2 (two) times daily. 02/26/21   Pickenpack-Cousar, AtCarlena SaxNP  ?   ? ?Allergies    ?Patient has no known allergies.   ? ?Review of Systems   ?Review of Systems  ?Constitutional:  Negative for fever.  ?HENT:  Negative for ear pain and sore throat.   ?Eyes:  Negative for pain.  ?Respiratory:  Negative for cough.   ?Cardiovascular:  Negative for chest pain.  ?Gastrointestinal:  Negative for abdominal pain.  ?Genitourinary:  Negative for flank pain.  ?Musculoskeletal:  Negative for back pain.  ?Skin:  Negative for color change and rash.  ?Neurological:  Negative for syncope.  ?All other systems reviewed and are negative. ? ?Physical Exam ?Updated Vital Signs ?BP 108/86 (BP Location: Right Arm)   Pulse 89   Temp 98.9 ?F (37.2 ?C) (Oral)   Resp 16   SpO2 99%  ?Physical Exam ?Vitals and nursing note reviewed.  ?  Constitutional:   ?   General: He is not in acute distress. ?   Appearance: He is well-developed.  ?HENT:  ?   Head: Normocephalic and atraumatic.  ?Eyes:  ?   Conjunctiva/sclera: Conjunctivae normal.  ?Cardiovascular:  ?   Rate and Rhythm: Normal rate and regular rhythm.  ?   Heart sounds: No murmur heard. ?Pulmonary:  ?   Effort: Pulmonary effort is normal. No respiratory distress.  ?Abdominal:  ?   Palpations: Abdomen is soft.  ?   Tenderness: There is no abdominal tenderness.  ?Musculoskeletal:     ?   General: No swelling.  ?   Cervical back: Neck supple.  ?Skin: ?   General: Skin is warm and dry.  ?   Capillary Refill: Capillary refill takes less  than 2 seconds.  ?Neurological:  ?   Mental Status: He is alert.  ?Psychiatric:     ?   Mood and Affect: Mood normal.  ? ? ?ED Results / Procedures / Treatments   ?Labs ?(all labs ordered are listed, but only abnormal results are displayed) ?Labs Reviewed - No data to display ? ?EKG ?None ? ?Radiology ?DG Chest 2 View ? ?Result Date: 04/06/2021 ?CLINICAL DATA:  Cough EXAM: CHEST - 2 VIEW COMPARISON:  04/01/2021 FINDINGS: Increased patchy right lower lung opacity with possible small right effusion. Normal cardiac size. No pneumothorax IMPRESSION: Slight increase patchy right lower lung opacity suspicious for pneumonia. Possible small right effusion Electronically Signed   By: Donavan Foil M.D.   On: 04/06/2021 15:30   ? ?Procedures ?Procedures  ? ? ?Medications Ordered in ED ?Medications - No data to display ? ?ED Course/ Medical Decision Making/ A&P ?  ?                        ?Medical Decision Making ?Amount and/or Complexity of Data Reviewed ?Independent Historian: caregiver ?   Details: History obtained from patient's mother at bedside ?External Data Reviewed: notes. ?   Details: Review of medical records shows recent admission and patient leaving against medical vice 5 days ago. ?Radiology: ordered. ?   Details: Chest x-ray concerning for persistent pneumonia. ? ?Risk ?Risk Details: Patient declines further testing, declines admission to the hospital still.  He states he has no symptoms, denies any cough or fevers or chest pain or shortness of breath or any pain elsewhere.  She is scheduled to follow-up with his oncologist which she prefers to do on an outpatient basis.  Mother is at bedside providing additional history as well. ? ?Patient given prescription of antibiotics, advised continued outpatient care, advised return if he has worsening symptoms difficulty breathing fevers pain or any additional concerns. ? ? ? ? ? ? ? ? ? ? ?Final Clinical Impression(s) / ED Diagnoses ?Final diagnoses:  ?Community  acquired pneumonia, unspecified laterality  ? ? ?Rx / DC Orders ?ED Discharge Orders   ? ?      Ordered  ?  amoxicillin-clavulanate (AUGMENTIN) 875-125 MG tablet  Every 12 hours       ? 04/06/21 1557  ? ?  ?  ? ?  ? ? ?  ?Luna Fuse, MD ?04/06/21 1557 ? ?

## 2021-04-06 NOTE — Telephone Encounter (Signed)
This RN spoke with [name] - per his call asking for Dr Keturah Barre number ( MD on his AMA/discharge ). ? ?This RN informed him above is a hospital doctor who sees pt when they are admitted. ? ?Discussed with [name] medical issues from last week and concern with need  to either return to the ER or see his primary MD. ? ?Note he passed the phone to his mother who stated he has a primary MD noted on his medicaid card as [facility]. ? ?She states [name] is reluctant to return to the ER but he might go to an outpt MD. ? ?This RN verified above as appropriate action. ? ?No further needs per this office at this time. ?

## 2021-04-09 ENCOUNTER — Other Ambulatory Visit: Payer: Medicaid Other

## 2021-04-09 ENCOUNTER — Ambulatory Visit: Payer: Medicaid Other

## 2021-04-09 ENCOUNTER — Ambulatory Visit: Payer: Medicaid Other | Admitting: Internal Medicine

## 2021-04-16 ENCOUNTER — Telehealth: Payer: Self-pay

## 2021-04-16 NOTE — Telephone Encounter (Signed)
Our office received a refill request for Mr. Majid's Tramadol. I called his mother, Nyoka Cowden, to inform her that we would have to see him in our office to refill this medication. We are scheduling his appt for tomorrow at 9am. I informed her that if he didn't need the prescription filled and had no other needs at this time, he did not have to come to this appt. Understanding verbalized. ?[name] also asked about Mr. Whitefield heart, as she stated that he was supposed to have a heart catheterization while he was at [facility], but left before this was done. He was seen by a cardiologist through [facility] and I gave her the number to the office to schedule a follow up. Understanding and appreciation verbalized. All questions answered. Advised to call our office with any questions/concerns.  ?

## 2021-04-17 ENCOUNTER — Telehealth: Payer: Self-pay

## 2021-04-17 ENCOUNTER — Encounter: Payer: Medicaid Other | Admitting: Nurse Practitioner

## 2021-04-17 NOTE — Telephone Encounter (Signed)
[name] mother, David [name], called our office to inform us that [name] did not want to come to his appt today with [name]. I informed her that, if he didn't come, we would not be able to refill his Tramadol. Understanding verbalized. She asked about refilling his antibiotics from the ED and I informed her that they gave him a set amount to take and after those are gone, we wouldn't refill due to increased resistance. Understanding verbalized. ?She asked about [name]'s follow up PET scan. I have reached out to the radiation team to see when this should be scheduled. All questions answered. Advised to call our office with any questions/concerns. Understanding and appreciation verbalized.  ?

## 2021-04-18 ENCOUNTER — Ambulatory Visit: Payer: Medicaid Other | Admitting: Hematology and Oncology

## 2021-04-18 ENCOUNTER — Other Ambulatory Visit: Payer: Medicaid Other

## 2021-04-18 ENCOUNTER — Ambulatory Visit: Payer: Medicaid Other

## 2021-04-19 ENCOUNTER — Other Ambulatory Visit: Payer: Self-pay

## 2021-04-19 DIAGNOSIS — C329 Malignant neoplasm of larynx, unspecified: Secondary | ICD-10-CM

## 2021-04-20 ENCOUNTER — Other Ambulatory Visit (HOSPITAL_COMMUNITY): Payer: Self-pay

## 2021-04-20 DIAGNOSIS — R1310 Dysphagia, unspecified: Secondary | ICD-10-CM

## 2021-04-25 ENCOUNTER — Ambulatory Visit (INDEPENDENT_AMBULATORY_CARE_PROVIDER_SITE_OTHER): Payer: Medicaid Other | Admitting: Internal Medicine

## 2021-04-25 ENCOUNTER — Other Ambulatory Visit: Payer: Self-pay

## 2021-04-25 ENCOUNTER — Encounter: Payer: Self-pay | Admitting: Internal Medicine

## 2021-04-25 VITALS — BP 126/82 | HR 96 | Temp 98.3°F | Ht 71.0 in | Wt 101.7 lb

## 2021-04-25 DIAGNOSIS — F411 Generalized anxiety disorder: Secondary | ICD-10-CM | POA: Diagnosis not present

## 2021-04-25 DIAGNOSIS — F1920 Other psychoactive substance dependence, uncomplicated: Secondary | ICD-10-CM | POA: Diagnosis not present

## 2021-04-25 DIAGNOSIS — R1310 Dysphagia, unspecified: Secondary | ICD-10-CM

## 2021-04-25 DIAGNOSIS — C329 Malignant neoplasm of larynx, unspecified: Secondary | ICD-10-CM

## 2021-04-25 DIAGNOSIS — C801 Malignant (primary) neoplasm, unspecified: Secondary | ICD-10-CM | POA: Diagnosis not present

## 2021-04-25 DIAGNOSIS — R778 Other specified abnormalities of plasma proteins: Secondary | ICD-10-CM

## 2021-04-25 DIAGNOSIS — R634 Abnormal weight loss: Secondary | ICD-10-CM

## 2021-04-25 DIAGNOSIS — I5020 Unspecified systolic (congestive) heart failure: Secondary | ICD-10-CM | POA: Diagnosis not present

## 2021-04-25 DIAGNOSIS — I509 Heart failure, unspecified: Secondary | ICD-10-CM

## 2021-04-25 MED ORDER — CARVEDILOL 6.25 MG PO TABS: 6.2500 mg | ORAL_TABLET | Freq: Two times a day (BID) | ORAL | 11 refills | Status: DC

## 2021-04-25 MED ORDER — ENTRESTO 49-51 MG PO TABS: 1.0000 | ORAL_TABLET | Freq: Two times a day (BID) | ORAL | 2 refills | Status: DC

## 2021-04-25 NOTE — Progress Notes (Signed)
? ?  CC: establish care ? ?HPI:[name] is a 46 y.o. male who presents for evaluation of establish care. Please see individual problem based A/P for details. ? ?Depression, PHQ-9: ?Based on the patients  score we have . ? ?Past Medical History:  ?Diagnosis Date  ? 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 Pneumothorax   ? Left lung spontaneous pneumothorax at age 16 yr   ? ?Review of Systems:   ?Review of Systems  ?Constitutional:  Positive for weight loss.  ?Respiratory: Negative.    ?Cardiovascular: Negative.   ?Gastrointestinal: Negative.   ?Genitourinary: Negative.   ?Musculoskeletal: Negative.   ?Neurological: Negative.   ?Psychiatric/Behavioral:  The patient is nervous/anxious.    ? ?Physical Exam: ?There were no vitals filed for this visit. ? ? ?General: AAOX3, chronically ill, emaciated ?HEENT: poor dentition, ulcerated lesion right side of tongue, clear oropharynx, mass right side of neck ?Cardiovascular: Normal rate, regular rhythm.  No murmurs, rubs, or gallops ?Pulmonary : Equal breath sounds, No wheezes, rales, or rhonchi ?Abdominal: soft, nontender,  bowel sounds present ?Ext: No edema in lower extremities, no tenderness to palpation of lower extremities. Diminshed muscle bulk ?Psych: labile mood ? ?Assessment & Plan:  ? ?See Encounters Tab for problem based charting. ? ?Patient discussed with Dr. Evette Doffing ? ?

## 2021-04-25 NOTE — Assessment & Plan Note (Addendum)
Follows with Dr. Fredric Dine ENT, Dr. Chryl Heck Heme-Onc, Dr. Isidore Moos Rad-onc. ?Patient had not scheduled post-treatment PET. ?Advised patient schedule this exam. ? ?

## 2021-04-25 NOTE — Patient Instructions (Signed)
Dear Mr. Volante, ? ?Thank you for trusting [name] with your care today.  ? ?We discussed your cancer, heart function, and weight loss. ? ?Please schedule the PET scan to be done in April. ?Please try to keep your weight up with the Ensures.  ? ?For your heart function, we will start you on two medications, Entresto and Coreg.  ?Please schedule an appointment with Cardiology. ? ?Dr. Theodis Shove with Behavioral health will be reaching out to you soon. ? ?Please return in 2-4 weeks to follow up.  ? ?

## 2021-04-25 NOTE — Assessment & Plan Note (Signed)
Patient continues to lose weight. Cites pain with mastication relating to ulcers on tongue and poor dentition. He has viscous lidocaine which helps. He acknowledges continued weight loss will limit recovery, but continues to decline G-tube stating "I don't want another hole in me". He has used Ensure supplements in the past with some improvement in weight, but did not reliably drink them due to taste. ?Weight continues to decline. Advised patient continue to attempt meal supplementation with Ensure supplements and to use lidocaine as needed. We will need to continue to have discussions with him regarding nutrition supplementation, however at this time, he adamantly refuses.  ? ?

## 2021-04-26 ENCOUNTER — Telehealth: Payer: Self-pay

## 2021-04-26 NOTE — Telephone Encounter (Signed)
Pa for pt ( [employer]  ) came through on cover my meds was submitted through [employer] with office notes ... ? ? ? [employer] will send approval or denial letter directly to patients home address with in the next few days or weeks .Marland Kitchen I no longer have control over PA  ?

## 2021-04-28 ENCOUNTER — Encounter: Payer: Self-pay | Admitting: Internal Medicine

## 2021-04-28 DIAGNOSIS — R1310 Dysphagia, unspecified: Secondary | ICD-10-CM | POA: Insufficient documentation

## 2021-04-28 DIAGNOSIS — I5020 Unspecified systolic (congestive) heart failure: Secondary | ICD-10-CM | POA: Insufficient documentation

## 2021-04-28 DIAGNOSIS — F419 Anxiety disorder, unspecified: Secondary | ICD-10-CM | POA: Insufficient documentation

## 2021-04-28 DIAGNOSIS — C801 Malignant (primary) neoplasm, unspecified: Secondary | ICD-10-CM | POA: Insufficient documentation

## 2021-04-28 DIAGNOSIS — F411 Generalized anxiety disorder: Secondary | ICD-10-CM | POA: Insufficient documentation

## 2021-04-28 NOTE — Assessment & Plan Note (Signed)
Patient expressing significant anxiety relating to recent health problems. Describes feeling overwhelmed by everything that has been going on. He fortunately is able to be assisted by his mother, however, he does appear to be coping poorly despite her support. He reports that he experiences frequent panic attacks and states her involvement in his care does provoke these occasionally.  Additionally, I noted his mood to be quite labile throughout my visit with him and question whether there may be some underlying personality disorder that has not been previously addressed. It does appear as though similar events have occurred during evaluation in the office of other physicians with previous recommendations for outpatient psychiatric follow up, however, I do not see evidence that he has seen anyone. I have discussed with him the benefits of our in-house behavioral specialist, and he is agreeable to meeting with Dr. Theodis Shove. I have placed this referral. Will not pursue pharmacotherapy at this point as patient expresses feeling overwhelmed by addition of heart failure medications already.  ?

## 2021-04-28 NOTE — Assessment & Plan Note (Addendum)
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  ?

## 2021-04-28 NOTE — Assessment & Plan Note (Signed)
Secondary to poor dentition and mucositis from cancer treatment. He was seen by dental, but was not deemed an appropriate candidate for tooth extraction due to recent head/neck radiotherapy.  ?He has poor dentition and obvious ulceration on tongue. He has been using OTC oral pain relieving paste and viscous lidocaine. States these medications help and he is able to tolerate some limited PO intake with them. He continues to adamantly decline G tube despite continued weight loss from this issue. Will continue to monitor for now, though I do believe he will likely need G tube in the future if weight loss does not show signs of reversing. ?

## 2021-04-28 NOTE — Assessment & Plan Note (Signed)
Noted to have elevated troponins and EF 25-30% and declined cardiac catheterization during during evaluation on 3/5. It was recommended that patient establish with cardiologist given these findings, but patient declined as he was feeling asymptomatic at the time of evaluation.  ?Patient denies symptoms of heart failure during OV. No chest pain, shortness of breath, orthopnea, or edema.  ?No evidence of volume overload noted on physical exam. 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 We have initiated GDMT with Entresto and carvedilol. Advised he establish with a cardiologist. ? ?

## 2021-05-01 ENCOUNTER — Ambulatory Visit: Payer: Medicaid Other

## 2021-05-01 ENCOUNTER — Telehealth: Payer: Self-pay | Admitting: *Deleted

## 2021-05-01 NOTE — Telephone Encounter (Signed)
Please call and speak with the patient's Mother in reference to the PA needed. ?

## 2021-05-01 NOTE — Telephone Encounter (Signed)
Returned patient's mom's phone call ([name]), spoke with patient's mom ?

## 2021-05-03 ENCOUNTER — Ambulatory Visit (HOSPITAL_COMMUNITY)
Admission: RE | Admit: 2021-05-03 | Discharge: 2021-05-03 | Disposition: A | Discharge status: Home / Self Care | Payer: Medicaid Other | Source: Ambulatory Visit | Attending: Radiation Oncology | Admitting: Radiation Oncology

## 2021-05-03 DIAGNOSIS — C329 Malignant neoplasm of larynx, unspecified: Secondary | ICD-10-CM | POA: Diagnosis not present

## 2021-05-03 DIAGNOSIS — R1313 Dysphagia, pharyngeal phase: Secondary | ICD-10-CM | POA: Insufficient documentation

## 2021-05-03 DIAGNOSIS — R1310 Dysphagia, unspecified: Secondary | ICD-10-CM

## 2021-05-03 NOTE — Progress Notes (Signed)
Internal Medicine Clinic Attending ° °Case discussed with Dr. Gawaluck  At the time of the visit.  We reviewed the resident’s history and exam and pertinent patient test results.  I agree with the assessment, diagnosis, and plan of care documented in the resident’s note.  °

## 2021-05-07 ENCOUNTER — Ambulatory Visit: Payer: Medicaid Other | Attending: Radiation Oncology

## 2021-05-07 DIAGNOSIS — R490 Dysphonia: Secondary | ICD-10-CM | POA: Insufficient documentation

## 2021-05-07 DIAGNOSIS — R1310 Dysphagia, unspecified: Secondary | ICD-10-CM | POA: Diagnosis present

## 2021-05-07 NOTE — Therapy (Addendum)
[place] [facility] ?[address], [address] ?[place], [place], [address] ?Phone: [phone]   Fax:  [phone] ? ?Speech Language Pathology Treatment/Renewal Summary ? ?Patient Details  ?Name: David [name] ?MRN: [id number] ?Date of Birth: [birth year] ?Referring Provider (SLP): [name], MD ? ? ?Encounter Date: 05/07/2021 ? ? End of Session - 05/07/21 1644   ? ? Visit Number 5   ? Number of Visits 7   ? Date for SLP Re-Evaluation 07/06/21   ? Authorization Type Medicaid   ? SLP Start Time 2831   ? SLP Stop Time  1610   ? SLP Time Calculation (min) 32 min   ? Activity Tolerance Patient tolerated treatment well   slight agitation after he coughed on first sip water  ? ?  ?  ? ?  ? ? ?Past Medical History:  ?Diagnosis Date  ? 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 Pneumothorax   ? Left lung spontaneous pneumothorax at age 63 yr   ? ? ?Past Surgical History:  ?Procedure Laterality Date  ? chest tubes Left   ? ? ?There were no vitals filed for this visit. ? ? Subjective Assessment - 05/07/21 1629   ? ? Subjective Pt with MBSS recently. SLP has reviewed results. Pt did not arrive with liquids today, but complains of "raw" rt lateral tongue which affects his mastication.   ? Currently in Pain? No/denies   ? ?  ?  ? ?  ? ? ? ? ? ? ? ? ADULT SLP TREATMENT - 05/07/21 1630   ? ?  ? General Information  ? Behavior/Cognition Cooperative;Alert;Pleasant mood   ?  ? Treatment Provided  ? Treatment provided Dysphagia   ?  ? Dysphagia Treatment  ? Temperature Spikes Noted No   ? Respiratory Status Room air   ? Oral Cavity - Dentition Poor condition;Missing dentition   ? Treatment Methods Skilled observation;Compensation strategy training;Patient/caregiver education   ? Patient observed directly with PO's Yes   ? Type of PO's observed Thin liquids   ? Liquids provided via Cup   ? Pharyngeal Phase Signs & Symptoms Immediate throat clear   x1/14  swallows thin  ? Other treatment/comments SLP focused today's session on pt demonstrating awareness of results of MBSS and recommendations, and rationale for those recommendations. Pt was 93% (13/14) successful with supraglottic with thin liquids in session with initial cue for sequencing. SLP educated/re-educated pt on rationale for supraglottic and pt return demonstrated, and demo'd understanding of explanation of rationale. SLP also highlighted need for oral care at least x2/day. Pt complained of rotting teeth and this is reason for rt lateral tongue being "raw" - in fact SLP observed this surface as purple and in a state of healing. Because of this SLP suggested dys I-II foods and provided pt with handout of suggestions in many diet areas (meats, cereals, fruits, etc) of dys I-II items. Pt pulled out a piece of chicken from breakfast from one of his teeth and SLP highlighted the need for oral care BID - and pt was provided rationale for this. Pt demonstrated understanding.   ?  ? Assessment / Recommendations / Plan  ? Plan Continue with current plan of care   ?  ? Dysphagia Recommendations  ? Diet recommendations Dysphagia 3 (mechanical soft);Thin liquid   ? Liquids provided via Cup   ? Compensations Small sips/bites;Slow rate   oral care BID  ?  Postural Changes and/or Swallow Maneuvers Hold breath before and during swallow (Supraglottic swallow);Seated upright 90 degrees   ?  ? Progression Toward Goals  ? Progression toward goals Progressing toward goals   ? ?  ?  ? ?  ? ? ? SLP Education - 05/07/21 1641   ? ? Education Details see note   ? Person(s) Educated Patient   ? Methods Explanation   ? Comprehension Verbalized understanding   ? ?  ?  ? ?  ? ? ? ?Short Term Goals 05/07/21 ?       ?SLP SHORT TERM GOAL #1    ?  Title pt will complete HEP with occasional min A  ?  Time    ?  Period --   vists, for all STGs  ?  Status Met  ?       ?  SLP SHORT TERM GOAL #2  ?  Title pt will tell SLP why pt is completing HEP  with modified independence  ?  Time    ?  Status Met  ?       ?  SLP SHORT TERM GOAL #3  ?  Title pt will describe 3 overt s/s aspiration PNA with modified independence  ?  Time    ?  Status Met  ?       ?  SLP SHORT TERM GOAL #4  ?  Title pt will tell SLP how a food journal could hasten return to a more normalized diet  ?  Time    ?  Status deferred  ?  ?   ?  ?  ?   ?  ?     ?Long Term Goals 05/07/21 ?       ?       ?  SLP LONG TERM GOAL #1  ?  Title pt will complete HEP with modified independence over two visits  ?  Time 2   04-04-21  ?  Period --   visits, for all LTGs  ?  Status Not met and Ongoing  (renewed 05-07-21)  ?       ?  SLP LONG TERM GOAL #2  ?  Title pt will describe how to modify HEP over time, and the timeline associated with reduction in HEP frequency with modified independence over two sessions  ?  Time 2  ?  Status Not met and Ongoing (renewed 05-07-21)  ?   ?  ?Plan - 05/07/21  ?  ?  Clinical Impression Statement At this time pt swallowing is deemed [facility] with diet/thin liquids as delineated in MBSS from last week. However due to lingual soreness SLP suggested dys I-II for pt today. SLP reviewed Pt MBSS with following results:  ?"Pt continues with moderately severe pharyngeal dysphagia (suspect esophageal component as well).  He continues with impaired pharyngeal motility resulting in pharyngeal retention with liquids more than solids, compromised laryngeal closure allowing laryngeal penetration during swallow and after as retention spills into open larynx from pyriform sinus.   Aspiration is worse when taking small sips after swallow from pyriform sinus retetnion spilling into pharynx.  ? Various postures including head turn right/left did not decrease retention or prevent penetration/aspriation and pt informed SLP it was awkward to swallow in this position.  In addition HOB reclined to approx 45* was uncomfortable for pt to swallow.   ? Super supraglottic and small boluses helpful to minimize  aspiration.  Pt did not orally transit tablet with thin  liquids despite several attempts and resulted in worsening aspiration of thin.  Use of pudding to swallow tablet was effective and easily pushed tablet into esophagus.  Pt reported he will swallow his medications using puree- followed with liquids.  Please see notes above regarding esophageal function. Pt with both audible and silent aspiration/penetration and poor awareness/sensation to retention. Secretions and edema noted - likely contributing to pt's dysphagia along with iatrogenic effects of XRT. ? Recommend pt continue diet with strict precautions including use of super supra glottic swallow - drinking water with meals.  Pt reports he does not have thickener.   It will be beneficial for pt to monitor vitals given his inconsisent sensation to dysphagia ramifications. Reviewed importance of pt mobilizing, maintaining strength of cough/voice and expectoration for airway protection.   ?Of note, pt is chronically aspirating his secretions evidenced by wet voice and thus oral care will be important.   ?Using teach back, written instructions with review of flouro loops pt educated to findings/recommendations. Diet recommendations: Regular solids;Free water protocol after oral care;Thin liquid ?Liquid Administration: via Cup ?Medication Administration: Whole meds with puree ?Compensations: Slow rate;Small sips/bites;Remain semi-upright after after feeds/meals;Seated upright at 90 degrees" ? ?See daily note above for more details. Data indicate that pt's swallow ability could very well decline over time following conclusion of their radiation therapy due to muscle disuse atrophy and/or muscle fibrosis. Pt will cont to need to be seen by SLP in order to assess safety of PO intake, assess the need for recommending any objective swallow assessment, and ensuring pt correctly completes the individualized HEP.  ?  Speech Therapy Frequency --   once approx every four  weeks  ?  Duration --   90 days/3 therapy sessions for this reporting period;  ?Overall plan of care to include approx 7 visits  ?  Treatment/Interventions Aspiration precaution training;Pharyngeal strengtheni

## 2021-05-07 NOTE — Patient Instructions (Signed)
Diet handout highlighting foods to make lateral right side of tongue heal ?

## 2021-05-08 NOTE — Telephone Encounter (Signed)
Pt's mother called back asking about the patient's Prior Authorization Request.  Per Prior message.  Please see Previous message about the PA needed. ? ?Pt's mother also has cancelled his appt for the following as the patient no longer wants to come to the clinic: ? ? ?Name: David [name], David [name]" MRN: [id number]  ?Date: 05/09/2021 Status: Can  ?Time: 8:45 AM Length: 30  ?Visit Type: OPEN ESTABLISHED [726] Copay: $0.00  ?Provider: Scarlett Presto, MD Department: [facility]  ?Referring Provider: Delene Ruffini    ? ?

## 2021-05-09 ENCOUNTER — Encounter: Payer: Medicaid Other | Admitting: Internal Medicine

## 2021-05-14 ENCOUNTER — Encounter (HOSPITAL_COMMUNITY)
Admission: RE | Admit: 2021-05-14 | Discharge: 2021-05-14 | Disposition: A | Discharge status: Home / Self Care | Payer: Medicaid Other | Source: Ambulatory Visit | Attending: Radiation Oncology | Admitting: Radiation Oncology

## 2021-05-14 DIAGNOSIS — C328 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: Secondary | ICD-10-CM | POA: Insufficient documentation

## 2021-05-14 LAB — GLUCOSE, CAPILLARY: Glucose-Capillary: 100 mg/dL — ABNORMAL HIGH (ref 70–99)

## 2021-05-14 MED ORDER — FLUDEOXYGLUCOSE F - 18 (FDG) INJECTION
5.0500 | Freq: Once | INTRAVENOUS | Status: AC | PRN
  Administered 2021-05-14: 5.05 via INTRAVENOUS

## 2021-05-15 ENCOUNTER — Telehealth: Payer: Self-pay | Admitting: *Deleted

## 2021-05-15 ENCOUNTER — Ambulatory Visit
Admission: RE | Admit: 2021-05-15 | Discharge: 2021-05-15 | Disposition: A | Discharge status: Home / Self Care | Payer: Medicaid Other | Source: Ambulatory Visit | Attending: Radiation Oncology | Admitting: Radiation Oncology

## 2021-05-15 ENCOUNTER — Inpatient Hospital Stay: Payer: Medicaid Other | Attending: Radiation Oncology | Admitting: Nutrition

## 2021-05-15 ENCOUNTER — Other Ambulatory Visit: Payer: Self-pay

## 2021-05-15 ENCOUNTER — Encounter: Payer: Self-pay | Admitting: Internal Medicine

## 2021-05-15 VITALS — BP 107/78 | HR 79 | Temp 97.7°F | Resp 20 | Ht 71.0 in | Wt 96.8 lb

## 2021-05-15 DIAGNOSIS — Z79899 Other long term (current) drug therapy: Secondary | ICD-10-CM | POA: Diagnosis not present

## 2021-05-15 DIAGNOSIS — C328 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: Secondary | ICD-10-CM | POA: Diagnosis present

## 2021-05-15 DIAGNOSIS — F17210 Nicotine dependence, cigarettes, uncomplicated: Secondary | ICD-10-CM | POA: Diagnosis not present

## 2021-05-15 DIAGNOSIS — C329 Malignant neoplasm of larynx, unspecified: Secondary | ICD-10-CM

## 2021-05-15 DIAGNOSIS — Z1329 Encounter for screening for other suspected endocrine disorder: Secondary | ICD-10-CM

## 2021-05-15 DIAGNOSIS — Z923 Personal history of irradiation: Secondary | ICD-10-CM | POA: Diagnosis not present

## 2021-05-15 LAB — BASIC METABOLIC PANEL - CANCER CENTER ONLY
Anion gap: 6 (ref 5–15)
BUN: 15 mg/dL (ref 6–20)
CO2: 33 mmol/L — ABNORMAL HIGH (ref 22–32)
Calcium: 9.5 mg/dL (ref 8.9–10.3)
Chloride: 98 mmol/L (ref 98–111)
Creatinine: 0.59 mg/dL — ABNORMAL LOW (ref 0.61–1.24)
GFR, Estimated: >60 mL/min (ref >60)
Glucose, Bld: 84 mg/dL (ref 70–99)
Potassium: 4.7 mmol/L (ref 3.5–5.1)
Sodium: 137 mmol/L (ref 135–145)

## 2021-05-15 LAB — TSH: TSH: 1.957 u[IU]/mL (ref 0.350–4.500)

## 2021-05-15 LAB — T4, FREE: Free T4: 0.92 ng/dL (ref 0.61–1.12)

## 2021-05-15 NOTE — Telephone Encounter (Signed)
CALLED PATIENT TO REMIND OF APPTS. FOR 05/15/21, SPOKE WITH PATIENT'S MOM AND SHE IS AWARE OF THESE APPTS. ?

## 2021-05-15 NOTE — Progress Notes (Signed)
Oncology Nurse Navigator Documentation  ? ?I met with David [name] and his mother before and during his appointment with Dr. Isidore Moos today. He was very happy to receive good results from his recent PET scan. He will see Dr. Chryl Heck in 3 months and Dr. Isidore Moos in 6 months to receive results of a repeat PET. I will call Dr. Dorathy Kinsman office and get an appointment scheduled at her next available time. I escorted him to see nutrition for his scheduled appointment. He knows to call me if he has any needs and knows that I will be calling him with the above appointment times.  ? ?Harlow Asa RN, BSN, OCN ?Head & Neck Oncology Nurse Navigator ?[place] [facility] ?Phone # [phone]  ?Fax # [phone]  ?

## 2021-05-15 NOTE — Progress Notes (Signed)
Brief nutrition follow-up completed with patient.  Patient pleased with PET scan results.  He completed radiation therapy on January 13 for [place] cancer.  Patient does not have a feeding tube. ? ?Weight decreased and documented as 96 pounds 12.8 ounces on April 18.  This is down from 110 pounds January 30. ?Patient reports he cannot eat because he has a sore tongue.  States teeth are cutting into his tongue and it is very painful to eat.  Reports he plans to eat soft foods later today. ? ?Nutrition diagnosis: Severe malnutrition ongoing. ? ?Intervention: ?Educated patient on importance of increasing overall nutrition.  Recommended patient choose Ensure Plus or equivalent along with other soft foods and liquids as tolerated to increase overall calories and protein to support healing. ?Provided patient with 1 case of Ensure Plus high-protein.  Also provided 1 bottle for patient to drink on the way home. ? ?Monitoring, evaluation, goals: Patient will work to increase calories and protein for weight gain and healing. ? ?Patient will contact RD for questions or concerns. ? ?**Disclaimer: This note was dictated with voice recognition software. Similar sounding words can inadvertently be transcribed and this note may contain transcription errors which may not have been corrected upon publication of note.** ? ?

## 2021-05-15 NOTE — Progress Notes (Signed)
Mr. Dehner presents today for follow-up after completing radiation to his larynx on 02/09/2021, and to review PET scan results from 05/14/2021 ? ?Pain issues, if any: Mainly related to his continued sore tongue and mouth sensitivity  ?Using a feeding tube?: N/A ?Weight changes, if any:  ?Wt Readings from Last 3 Encounters:  ?05/15/21 96 lb 12.8 oz (43.9 kg)  ?04/25/21 101 lb 11.2 oz (46.1 kg)  ?04/01/21 103 lb 13.4 oz (47.1 kg)  ? ?Swallowing issues, if any: Had MBSS on 05/03/2021 and saw Bridgett Larsson on 05/07/2021. Denies any trouble with swallowing, but does report the sores in his mouth make it very difficult to chew/eat ?Smoking or chewing tobacco? Continues to smoke cigatettes (mainly in the evenings) ?Using fluoride trays daily? N/A--Reports very sensitive/tender spots to his tongue and gums.  ?Last ENT visit was on: Not since diagnosis ?Other notable issues, if any: Denies any ear or jaw pain or difficulty opening his mouth. Continues to deal with mucus and thick saliva. Skin appears well  healed in treatment field. Denies any signs of lymphedema to neck or under chin ? ? ? ? ?

## 2021-05-16 ENCOUNTER — Other Ambulatory Visit: Payer: Self-pay

## 2021-05-16 DIAGNOSIS — C329 Malignant neoplasm of larynx, unspecified: Secondary | ICD-10-CM

## 2021-05-17 ENCOUNTER — Other Ambulatory Visit: Payer: Self-pay

## 2021-05-17 ENCOUNTER — Telehealth: Payer: Self-pay | Admitting: Hematology and Oncology

## 2021-05-17 DIAGNOSIS — R112 Nausea with vomiting, unspecified: Secondary | ICD-10-CM

## 2021-05-17 MED ORDER — ONDANSETRON HCL 8 MG PO TABS: 8.0000 mg | ORAL_TABLET | Freq: Three times a day (TID) | ORAL | 2 refills | Status: DC | PRN

## 2021-05-17 NOTE — Telephone Encounter (Signed)
Scheduled appointment per provider request. Patient aware.  ?

## 2021-05-18 ENCOUNTER — Encounter: Payer: Self-pay | Admitting: Radiation Oncology

## 2021-05-18 DIAGNOSIS — C328 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: Secondary | ICD-10-CM | POA: Insufficient documentation

## 2021-05-18 NOTE — Progress Notes (Signed)
?Radiation Oncology         [phone] ?________________________________ ? ?Name: Cleve Paolillo MRN: [id number]  ?Date: 05/15/2021  DOB: 26-Jul-1975 ? ?Follow-Up Visit Note ? ?CC: Delene Ruffini, MD  [name], [name], DO ? ?Diagnosis and Prior Radiotherapy:     ?  ICD-10-CM   ?1. 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  [place] Only  ?  [place] Only  ?  ?2. Laryngeal cancer ([place])  C32.9   ?  ? Cancer Staging  ?Laryngeal cancer ([place]) ?Staging form: Larynx - Supraglottis, AJCC 8th Edition ?- Clinical stage from 12/01/2020: Stage IVB (cT3, cN3b, cM0) - Signed by [name], MD on 12/20/2020 ?Stage prefix: Initial diagnosis ? ? ?CHIEF COMPLAINT:  Here for follow-up and surveillance of laryngeal cancer ? ?Narrative:  Mr. Porche presents today for follow-up after completing radiation to his larynx on 02/09/2021, and to review PET scan results from 05/14/2021 ? ?Pain issues, if any: Mainly related to his continued sore tongue and mouth sensitivity  ?Using a feeding tube?: N/A ?Weight changes, if any:  ?Wt Readings from Last 3 Encounters:  ?05/15/21 96 lb 12.8 oz (43.9 kg)  ?04/25/21 101 lb 11.2 oz (46.1 kg)  ?04/01/21 103 lb 13.4 oz (47.1 kg)  ? ?Swallowing issues, if any: Had MBSS on 05/03/2021 and saw Bridgett Larsson on 05/07/2021. Denies any trouble with swallowing, but does report the sores in his mouth make it very difficult to chew/eat ?Smoking or chewing tobacco? Continues to smoke cigatettes (mainly in the evenings) ?Using fluoride trays daily? N/A--Reports very sensitive/tender spots to his tongue and gums.  ?Last ENT visit was on: Not since diagnosis ?Other notable issues, if any: Denies any ear or jaw pain or difficulty opening his mouth. Continues to deal with mucus and thick saliva. Skin appears well  healed in treatment field. Denies any signs of lymphedema to neck or  under chin ? ?ALLERGIES:  has No Known Allergies. ? ?Meds: ?Current Outpatient Medications  ?Medication Sig Dispense Refill  ? amoxicillin-clavulanate (AUGMENTIN) 875-125 MG tablet Take 1 tablet by mouth every 12 (twelve) hours. 14 tablet 0  ? carvedilol (COREG) 6.25 MG tablet Take 1 tablet (6.25 mg total) by mouth 2 (two) times daily. 60 tablet 11  ? clotrimazole (MYCELEX) 10 MG troche Take 1 tablet (10 mg total) by mouth 5 (five) times daily. Suck on these slowly. 70 Troche 0  ? dronabinol (MARINOL) 2.5 MG capsule Take 2.5 mg by mouth 2 (two) times daily before a meal. (Patient not taking: Reported on 03/31/2021)    ? guaiFENesin (MUCINEX) 600 MG 12 hr tablet Take 2 tablets (1,200 mg total) by mouth 2 (two) times daily. 60 tablet 1  ? lidocaine (XYLOCAINE) 2 % solution Patient: Mix 1part 2% viscous lidocaine, 1part H20. Swish/swallow 59m of diluted mixture, 343m before meals and '@bedtime'$ , up to QID prn soreness 200 mL 2  ? methadone (DOLOPHINE) 10 MG/ML solution Take 6 mLs (60 mg total) by mouth daily. 90 mL 0  ? nicotine (NICODERM CQ - DOSED IN MG/24 HOURS) 14 mg/24hr patch Place 1 patch (14 mg total) onto the skin daily. Apply 21 mg patch daily x 6 wk, then '14mg'$  patch daily x 2 wk, then 7 mg patch daily x 2 wk 14 patch 0  ? ondansetron (ZOFRAN) 8 MG tablet Take 1 tablet (8 mg total) by mouth every 8 (  eight) hours as needed for nausea or vomiting. Starting day 3 after chemotherapy 30 tablet 2  ? sacubitril-valsartan (ENTRESTO) 49-51 MG Take 1 tablet by mouth 2 (two) times daily. 60 tablet 2  ? traMADol (ULTRAM) 50 MG tablet Take 1 tablet (50 mg total) by mouth 2 (two) times daily. 60 tablet 0  ? ?No current facility-administered medications for this encounter.  ? ? ?Physical Findings: ?The patient is in no acute distress. Patient is alert and oriented. ?Wt Readings from Last 3 Encounters:  ?05/15/21 96 lb 12.8 oz (43.9 kg)  ?04/25/21 101 lb 11.2 oz (46.1 kg)  ?04/01/21 103 lb 13.4 oz (47.1 kg)  ? ? height is 5'  11" (1.803 m) and weight is 96 lb 12.8 oz (43.9 kg). His temperature is 97.7 ?F (36.5 ?C). His blood pressure is 107/78 and his pulse is 79. His respiration is 20 and oxygen saturation is 100%. Marland Kitchen  ?General: Alert and oriented, in no acute distress ?HEENT: Head is normocephalic. Extraocular movements are intact. Oropharynx is notable for no tumor in upper throat.  He is still hoarse and has some stridor.  There is a ulcerative lesion in the underbelly of the right tongue.  Photograph sent to Dr. Fredric Dine ?Neck: Neck is notable for fullness in the right level 2 region possibly corresponding with calcified node on imaging ?Skin: Skin in treatment fields is dry ?  ? ?Lab Findings: ?Lab Results  ?Component Value Date  ? WBC 11.8 (H) 04/01/2021  ? HGB 11.1 (L) 04/01/2021  ? HCT 32.0 (L) 04/01/2021  ? MCV 90.7 04/01/2021  ? PLT 202 04/01/2021  ? ? ?Lab Results  ?Component Value Date  ? TSH 1.957 05/15/2021  ? ?  ? ?  Latest Ref Rng & Units 05/15/2021  ?  1:39 PM 04/01/2021  ?  6:12 AM 03/31/2021  ?  9:04 PM  ?BMP  ?Glucose 70 - 99 mg/dL 84   113   88    ?BUN 6 - 20 mg/dL '15   9   19    '$ ?Creatinine 0.61 - 1.24 mg/dL 0.59   0.72   0.63    ?Sodium 135 - 145 mmol/L 137   137   130    ?Potassium 3.5 - 5.1 mmol/L 4.7   4.1   3.2    ?Chloride 98 - 111 mmol/L 98   101   93    ?CO2 22 - 32 mmol/L 33   25   28    ?Calcium 8.9 - 10.3 mg/dL 9.5   9.0   9.1    ? ? ?Radiographic Findings: ?NM PET Image Restag (PS) Skull Base To Thigh ? ?Result Date: 05/15/2021 ?CLINICAL DATA:  Subsequent treatment strategy for laryngeal cancer. EXAM: NUCLEAR MEDICINE PET SKULL BASE TO THIGH TECHNIQUE: 5.05 mCi F-18 FDG was injected intravenously. Full-ring PET imaging was performed from the skull base to thigh after the radiotracer. CT data was obtained and used for attenuation correction and anatomic localization. Fasting blood glucose: 100 mg/dl COMPARISON:  PET-CT 12/12/2020.  Neck CT 03/31/2021 FINDINGS: Mediastinal blood pool activity: SUV max 1.1 NECK:  There is no significant residual hypermetabolic activity at the site of the previously demonstrated large mass involving the oropharynx and hypopharynx. There is nonspecific activity anteriorly in the oral cavity associated with the tongue (SUV max 7.2) without definite CT correlate, likely physiologic. The previously demonstrated extensive hypermetabolic cervical lymphadenopathy has improved. There are multiple peripherally calcified cervical lymph nodes bilaterally, largest a level II  node on the right measuring 3.0 x 1.5 cm on image 35/4 (3.9 x 3.2 cm on previous PET-CT). There is no residual abnormal hypermetabolic activity associated with this node (previously 24.0) or other cervical lymph nodes. Incidental CT findings: Soft tissue edema anteriorly in the neck without evidence of focal fluid collection. CHEST: There are no hypermetabolic mediastinal, hilar or axillary lymph nodes. No hypermetabolic pulmonary activity or suspicious nodularity. Incidental CT findings: Mild centrilobular emphysema with mild central airway thickening and probable radiation changes in both lung apices. Previously demonstrated patchy ground-glass opacities have resolved. New linear densities within the right middle lobe, suggesting interval aspiration. Mild aortic and great vessel atherosclerosis. ABDOMEN/PELVIS: There is no hypermetabolic activity within the liver, adrenal glands, spleen or pancreas. There is no hypermetabolic nodal activity. Incidental CT findings: Aortic and branch vessel atherosclerosis. Prominent stool throughout the colon again noted, consistent with constipation. SKELETON: There is no hypermetabolic activity to suggest osseous metastatic disease. There is mild diffuse marrow activity, likely treatment related. In addition, there is prominent muscular activity, especially in the neck, diaphragm and both upper extremities, all within physiologic limits. No focal muscular activity to suggest neoplasm. Incidental  CT findings: none IMPRESSION: 1. Significant response to therapy compared with previous PET-CT of 5 months ago. There is no residual hypermetabolic activity within the previously demonstrated bilater

## 2021-05-21 ENCOUNTER — Ambulatory Visit: Payer: Medicaid Other | Admitting: Internal Medicine

## 2021-05-23 ENCOUNTER — Ambulatory Visit: Payer: Medicaid Other | Admitting: Internal Medicine

## 2021-06-04 ENCOUNTER — Ambulatory Visit: Payer: Medicaid Other | Attending: Radiation Oncology

## 2021-06-05 NOTE — Therapy (Signed)
[place] [facility] ?[address], [address] ?[place], [place], [address] ?Phone: [phone]   Fax:  [phone] ? ?Patient Details  ?Name: David [name] ?MRN: [id number] ?Date of Birth: May 29, 1975 ?Referring Provider:  [name], MD ? ?Encounter Date: 06/05/2021 ? ? ? ?No-Show - Speech Therapy (ST) ? ?Pt no-showed his scheduled speech (swallow) therapy follow up appointment scheduled for 06-04-21.  ?On 06-05-21 @ 0912 SLP left message on pt's VM at the preferred number in [facility] to call clinic to reschedule ST appointment. SLP provided open days and times, and the clinic phone number, twice. ? ? ?[place], [place] ?06/05/2021, 9:35 AM ? ?[place] ?[facility] ?[address], [address] ?[place], [place], [address] ?Phone: [phone]   Fax:  [phone] ?

## 2021-06-06 ENCOUNTER — Emergency Department (HOSPITAL_COMMUNITY): Payer: Medicaid Other

## 2021-06-06 ENCOUNTER — Inpatient Hospital Stay (HOSPITAL_COMMUNITY)
Admission: EM | Admit: 2021-06-06 | Discharge: 2021-08-22 | DRG: 004 | Disposition: A | Discharge status: Home Health | Payer: Medicaid Other | Attending: Student in an Organized Health Care Education/Training Program | Admitting: Student in an Organized Health Care Education/Training Program

## 2021-06-06 DIAGNOSIS — R1313 Dysphagia, pharyngeal phase: Secondary | ICD-10-CM | POA: Diagnosis present

## 2021-06-06 DIAGNOSIS — K0530 Chronic periodontitis, unspecified: Secondary | ICD-10-CM | POA: Diagnosis present

## 2021-06-06 DIAGNOSIS — Z930 Tracheostomy status: Secondary | ICD-10-CM

## 2021-06-06 DIAGNOSIS — D638 Anemia in other chronic diseases classified elsewhere: Secondary | ICD-10-CM | POA: Diagnosis present

## 2021-06-06 DIAGNOSIS — K5900 Constipation, unspecified: Secondary | ICD-10-CM | POA: Diagnosis present

## 2021-06-06 DIAGNOSIS — B953 Streptococcus pneumoniae as the cause of diseases classified elsewhere: Secondary | ICD-10-CM | POA: Diagnosis not present

## 2021-06-06 DIAGNOSIS — Z20822 Contact with and (suspected) exposure to covid-19: Secondary | ICD-10-CM | POA: Diagnosis present

## 2021-06-06 DIAGNOSIS — D649 Anemia, unspecified: Secondary | ICD-10-CM | POA: Diagnosis not present

## 2021-06-06 DIAGNOSIS — A403 Sepsis due to Streptococcus pneumoniae: Secondary | ICD-10-CM | POA: Diagnosis present

## 2021-06-06 DIAGNOSIS — Z751 Person awaiting admission to adequate facility elsewhere: Secondary | ICD-10-CM

## 2021-06-06 DIAGNOSIS — B376 Candidal endocarditis: Secondary | ICD-10-CM | POA: Diagnosis not present

## 2021-06-06 DIAGNOSIS — F32A Depression, unspecified: Secondary | ICD-10-CM | POA: Diagnosis present

## 2021-06-06 DIAGNOSIS — Z7189 Other specified counseling: Secondary | ICD-10-CM | POA: Diagnosis not present

## 2021-06-06 DIAGNOSIS — Z8521 Personal history of malignant neoplasm of larynx: Secondary | ICD-10-CM | POA: Diagnosis not present

## 2021-06-06 DIAGNOSIS — I5022 Chronic systolic (congestive) heart failure: Secondary | ICD-10-CM | POA: Diagnosis not present

## 2021-06-06 DIAGNOSIS — R6520 Severe sepsis without septic shock: Secondary | ICD-10-CM | POA: Diagnosis not present

## 2021-06-06 DIAGNOSIS — Z681 Body mass index (BMI) 19 or less, adult: Secondary | ICD-10-CM

## 2021-06-06 DIAGNOSIS — B377 Candidal sepsis: Secondary | ICD-10-CM | POA: Diagnosis present

## 2021-06-06 DIAGNOSIS — S60221A Contusion of right hand, initial encounter: Secondary | ICD-10-CM | POA: Diagnosis not present

## 2021-06-06 DIAGNOSIS — J9621 Acute and chronic respiratory failure with hypoxia: Secondary | ICD-10-CM | POA: Diagnosis present

## 2021-06-06 DIAGNOSIS — Z931 Gastrostomy status: Secondary | ICD-10-CM

## 2021-06-06 DIAGNOSIS — Z0120 Encounter for dental examination and cleaning without abnormal findings: Secondary | ICD-10-CM

## 2021-06-06 DIAGNOSIS — L24B2 Irritant contact dermatitis related to respiratory stoma or fistula: Secondary | ICD-10-CM | POA: Diagnosis not present

## 2021-06-06 DIAGNOSIS — F17210 Nicotine dependence, cigarettes, uncomplicated: Secondary | ICD-10-CM | POA: Diagnosis present

## 2021-06-06 DIAGNOSIS — Z711 Person with feared health complaint in whom no diagnosis is made: Secondary | ICD-10-CM | POA: Diagnosis not present

## 2021-06-06 DIAGNOSIS — E874 Mixed disorder of acid-base balance: Secondary | ICD-10-CM | POA: Diagnosis not present

## 2021-06-06 DIAGNOSIS — Z79899 Other long term (current) drug therapy: Secondary | ICD-10-CM

## 2021-06-06 DIAGNOSIS — N179 Acute kidney failure, unspecified: Secondary | ICD-10-CM | POA: Diagnosis present

## 2021-06-06 DIAGNOSIS — Z789 Other specified health status: Secondary | ICD-10-CM | POA: Diagnosis not present

## 2021-06-06 DIAGNOSIS — Z9221 Personal history of antineoplastic chemotherapy: Secondary | ICD-10-CM

## 2021-06-06 DIAGNOSIS — E860 Dehydration: Secondary | ICD-10-CM | POA: Diagnosis present

## 2021-06-06 DIAGNOSIS — B182 Chronic viral hepatitis C: Secondary | ICD-10-CM | POA: Diagnosis present

## 2021-06-06 DIAGNOSIS — J9690 Respiratory failure, unspecified, unspecified whether with hypoxia or hypercapnia: Secondary | ICD-10-CM | POA: Diagnosis not present

## 2021-06-06 DIAGNOSIS — A419 Sepsis, unspecified organism: Secondary | ICD-10-CM

## 2021-06-06 DIAGNOSIS — J9601 Acute respiratory failure with hypoxia: Secondary | ICD-10-CM | POA: Diagnosis present

## 2021-06-06 DIAGNOSIS — E162 Hypoglycemia, unspecified: Secondary | ICD-10-CM | POA: Diagnosis not present

## 2021-06-06 DIAGNOSIS — F419 Anxiety disorder, unspecified: Secondary | ICD-10-CM | POA: Diagnosis not present

## 2021-06-06 DIAGNOSIS — I5042 Chronic combined systolic (congestive) and diastolic (congestive) heart failure: Secondary | ICD-10-CM | POA: Diagnosis present

## 2021-06-06 DIAGNOSIS — R638 Other symptoms and signs concerning food and fluid intake: Secondary | ICD-10-CM

## 2021-06-06 DIAGNOSIS — I110 Hypertensive heart disease with heart failure: Secondary | ICD-10-CM | POA: Diagnosis present

## 2021-06-06 DIAGNOSIS — I959 Hypotension, unspecified: Secondary | ICD-10-CM | POA: Diagnosis present

## 2021-06-06 DIAGNOSIS — Z66 Do not resuscitate: Secondary | ICD-10-CM

## 2021-06-06 DIAGNOSIS — I330 Acute and subacute infective endocarditis: Secondary | ICD-10-CM

## 2021-06-06 DIAGNOSIS — F1120 Opioid dependence, uncomplicated: Secondary | ICD-10-CM | POA: Diagnosis present

## 2021-06-06 DIAGNOSIS — G4700 Insomnia, unspecified: Secondary | ICD-10-CM | POA: Diagnosis not present

## 2021-06-06 DIAGNOSIS — Z538 Procedure and treatment not carried out for other reasons: Secondary | ICD-10-CM | POA: Diagnosis not present

## 2021-06-06 DIAGNOSIS — R627 Adult failure to thrive: Secondary | ICD-10-CM | POA: Diagnosis present

## 2021-06-06 DIAGNOSIS — J690 Pneumonitis due to inhalation of food and vomit: Secondary | ICD-10-CM | POA: Diagnosis present

## 2021-06-06 DIAGNOSIS — R64 Cachexia: Secondary | ICD-10-CM | POA: Diagnosis present

## 2021-06-06 DIAGNOSIS — F1920 Other psychoactive substance dependence, uncomplicated: Secondary | ICD-10-CM | POA: Diagnosis not present

## 2021-06-06 DIAGNOSIS — F1910 Other psychoactive substance abuse, uncomplicated: Secondary | ICD-10-CM | POA: Diagnosis present

## 2021-06-06 DIAGNOSIS — R7881 Bacteremia: Secondary | ICD-10-CM | POA: Diagnosis present

## 2021-06-06 DIAGNOSIS — R54 Age-related physical debility: Secondary | ICD-10-CM

## 2021-06-06 DIAGNOSIS — R6521 Severe sepsis with septic shock: Secondary | ICD-10-CM | POA: Diagnosis present

## 2021-06-06 DIAGNOSIS — C329 Malignant neoplasm of larynx, unspecified: Secondary | ICD-10-CM | POA: Diagnosis not present

## 2021-06-06 DIAGNOSIS — G8929 Other chronic pain: Secondary | ICD-10-CM | POA: Diagnosis present

## 2021-06-06 DIAGNOSIS — D696 Thrombocytopenia, unspecified: Secondary | ICD-10-CM | POA: Diagnosis present

## 2021-06-06 DIAGNOSIS — M549 Dorsalgia, unspecified: Secondary | ICD-10-CM

## 2021-06-06 DIAGNOSIS — E876 Hypokalemia: Secondary | ICD-10-CM | POA: Diagnosis present

## 2021-06-06 DIAGNOSIS — M79661 Pain in right lower leg: Secondary | ICD-10-CM | POA: Diagnosis not present

## 2021-06-06 DIAGNOSIS — J384 Edema of larynx: Secondary | ICD-10-CM | POA: Diagnosis present

## 2021-06-06 DIAGNOSIS — J386 Stenosis of larynx: Secondary | ICD-10-CM

## 2021-06-06 DIAGNOSIS — I9589 Other hypotension: Secondary | ICD-10-CM | POA: Diagnosis not present

## 2021-06-06 DIAGNOSIS — Y842 Radiological procedure and radiotherapy as the cause of abnormal reaction of the patient, or of later complication, without mention of misadventure at the time of the procedure: Secondary | ICD-10-CM | POA: Diagnosis present

## 2021-06-06 DIAGNOSIS — D75839 Thrombocytosis, unspecified: Secondary | ICD-10-CM | POA: Diagnosis not present

## 2021-06-06 DIAGNOSIS — L89152 Pressure ulcer of sacral region, stage 2: Secondary | ICD-10-CM | POA: Diagnosis not present

## 2021-06-06 DIAGNOSIS — J13 Pneumonia due to Streptococcus pneumoniae: Secondary | ICD-10-CM | POA: Diagnosis not present

## 2021-06-06 DIAGNOSIS — M79604 Pain in right leg: Secondary | ICD-10-CM | POA: Diagnosis not present

## 2021-06-06 DIAGNOSIS — F411 Generalized anxiety disorder: Secondary | ICD-10-CM | POA: Diagnosis present

## 2021-06-06 DIAGNOSIS — Z923 Personal history of irradiation: Secondary | ICD-10-CM

## 2021-06-06 DIAGNOSIS — Z8249 Family history of ischemic heart disease and other diseases of the circulatory system: Secondary | ICD-10-CM

## 2021-06-06 DIAGNOSIS — R590 Localized enlarged lymph nodes: Secondary | ICD-10-CM | POA: Diagnosis present

## 2021-06-06 DIAGNOSIS — Z515 Encounter for palliative care: Secondary | ICD-10-CM

## 2021-06-06 DIAGNOSIS — Z91199 Patient's noncompliance with other medical treatment and regimen due to unspecified reason: Secondary | ICD-10-CM

## 2021-06-06 DIAGNOSIS — J80 Acute respiratory distress syndrome: Secondary | ICD-10-CM | POA: Diagnosis not present

## 2021-06-06 DIAGNOSIS — E43 Unspecified severe protein-calorie malnutrition: Secondary | ICD-10-CM | POA: Diagnosis present

## 2021-06-06 DIAGNOSIS — Z5900 Homelessness unspecified: Secondary | ICD-10-CM

## 2021-06-06 DIAGNOSIS — I509 Heart failure, unspecified: Secondary | ICD-10-CM | POA: Diagnosis not present

## 2021-06-06 DIAGNOSIS — R001 Bradycardia, unspecified: Secondary | ICD-10-CM | POA: Diagnosis not present

## 2021-06-06 DIAGNOSIS — R531 Weakness: Secondary | ICD-10-CM | POA: Diagnosis not present

## 2021-06-06 DIAGNOSIS — M545 Low back pain, unspecified: Secondary | ICD-10-CM | POA: Diagnosis not present

## 2021-06-06 DIAGNOSIS — E8809 Other disorders of plasma-protein metabolism, not elsewhere classified: Secondary | ICD-10-CM | POA: Diagnosis present

## 2021-06-06 DIAGNOSIS — L8990 Pressure ulcer of unspecified site, unspecified stage: Secondary | ICD-10-CM | POA: Diagnosis not present

## 2021-06-06 HISTORY — DX: Acute respiratory failure with hypoxia: J96.01

## 2021-06-06 HISTORY — DX: Pneumonitis due to inhalation of food and vomit: J69.0

## 2021-06-06 HISTORY — DX: Acute and subacute infective endocarditis: I33.0

## 2021-06-06 LAB — COMPREHENSIVE METABOLIC PANEL
ALT: 22 U/L (ref 0–44)
AST: 50 U/L — ABNORMAL HIGH (ref 15–41)
Albumin: 2.3 g/dL — ABNORMAL LOW (ref 3.5–5.0)
Alkaline Phosphatase: 63 U/L (ref 38–126)
Anion gap: 18 — ABNORMAL HIGH (ref 5–15)
BUN: 72 mg/dL — ABNORMAL HIGH (ref 6–20)
CO2: 26 mmol/L (ref 22–32)
Calcium: 10.2 mg/dL (ref 8.9–10.3)
Chloride: 95 mmol/L — ABNORMAL LOW (ref 98–111)
Creatinine, Ser: 1.73 mg/dL — ABNORMAL HIGH (ref 0.61–1.24)
GFR, Estimated: 49 mL/min — ABNORMAL LOW (ref >60)
Glucose, Bld: 86 mg/dL (ref 70–99)
Potassium: 4.6 mmol/L (ref 3.5–5.1)
Sodium: 139 mmol/L (ref 135–145)
Total Bilirubin: 1 mg/dL (ref 0.3–1.2)
Total Protein: 7.2 g/dL (ref 6.5–8.1)

## 2021-06-06 LAB — URINALYSIS, ROUTINE W REFLEX MICROSCOPIC
Bacteria, UA: NONE SEEN
Bilirubin Urine: NEGATIVE
Glucose, UA: NEGATIVE mg/dL
Ketones, ur: NEGATIVE mg/dL
Leukocytes,Ua: NEGATIVE
Nitrite: NEGATIVE
Protein, ur: 30 mg/dL — AB
Specific Gravity, Urine: 1.018 (ref 1.005–1.030)
pH: 5 (ref 5.0–8.0)

## 2021-06-06 LAB — CBC WITH DIFFERENTIAL/PLATELET
Abs Immature Granulocytes: 0.12 10*3/uL — ABNORMAL HIGH (ref 0.00–0.07)
Basophils Absolute: 0 10*3/uL (ref 0.0–0.1)
Basophils Relative: 0 %
Eosinophils Absolute: 0 10*3/uL (ref 0.0–0.5)
Eosinophils Relative: 0 %
HCT: 42.6 % (ref 39.0–52.0)
Hemoglobin: 13.6 g/dL (ref 13.0–17.0)
Immature Granulocytes: 1 %
Lymphocytes Relative: 2 %
Lymphs Abs: 0.4 10*3/uL — ABNORMAL LOW (ref 0.7–4.0)
MCH: 29.9 pg (ref 26.0–34.0)
MCHC: 31.9 g/dL (ref 30.0–36.0)
MCV: 93.6 fL (ref 80.0–100.0)
Monocytes Absolute: 1.1 10*3/uL — ABNORMAL HIGH (ref 0.1–1.0)
Monocytes Relative: 5 %
Neutro Abs: 21.7 10*3/uL — ABNORMAL HIGH (ref 1.7–7.7)
Neutrophils Relative %: 92 %
Platelets: 463 10*3/uL — ABNORMAL HIGH (ref 150–400)
RBC: 4.55 MIL/uL (ref 4.22–5.81)
RDW: 14.6 % (ref 11.5–15.5)
WBC: 23.4 10*3/uL — ABNORMAL HIGH (ref 4.0–10.5)
nRBC: 0 % (ref 0.0–0.2)

## 2021-06-06 LAB — STREP PNEUMONIAE URINARY ANTIGEN: Strep Pneumo Urinary Antigen: POSITIVE — AB

## 2021-06-06 LAB — I-STAT ARTERIAL BLOOD GAS, ED
Acid-Base Excess: 5 mmol/L — ABNORMAL HIGH (ref 0.0–2.0)
Acid-Base Excess: 13 mmol/L — ABNORMAL HIGH (ref 0.0–2.0)
Bicarbonate: 31.4 mmol/L — ABNORMAL HIGH (ref 20.0–28.0)
Bicarbonate: 40.5 mmol/L — ABNORMAL HIGH (ref 20.0–28.0)
Calcium, Ion: 1.29 mmol/L (ref 1.15–1.40)
Calcium, Ion: 1.27 mmol/L (ref 1.15–1.40)
HCT: 41 % (ref 39.0–52.0)
HCT: 32 % — ABNORMAL LOW (ref 39.0–52.0)
Hemoglobin: 13.9 g/dL (ref 13.0–17.0)
Hemoglobin: 10.9 g/dL — ABNORMAL LOW (ref 13.0–17.0)
O2 Saturation: 96 %
O2 Saturation: 100 %
Patient temperature: 98.6
Potassium: 4 mmol/L (ref 3.5–5.1)
Potassium: 3 mmol/L — ABNORMAL LOW (ref 3.5–5.1)
Sodium: 138 mmol/L (ref 135–145)
Sodium: 142 mmol/L (ref 135–145)
TCO2: 33 mmol/L — ABNORMAL HIGH (ref 22–32)
TCO2: 42 mmol/L — ABNORMAL HIGH (ref 22–32)
pCO2 arterial: 54.5 mmHg — ABNORMAL HIGH (ref 32–48)
pCO2 arterial: 66.3 mmHg (ref 32–48)
pH, Arterial: 7.368 (ref 7.35–7.45)
pH, Arterial: 7.394 (ref 7.35–7.45)
pO2, Arterial: 89 mmHg (ref 83–108)
pO2, Arterial: 216 mmHg — ABNORMAL HIGH (ref 83–108)

## 2021-06-06 LAB — LACTIC ACID, PLASMA
Lactic Acid, Venous: 8.4 mmol/L (ref 0.5–1.9)
Lactic Acid, Venous: 1.6 mmol/L (ref 0.5–1.9)

## 2021-06-06 LAB — MAGNESIUM: Magnesium: 2.6 mg/dL — ABNORMAL HIGH (ref 1.7–2.4)

## 2021-06-06 LAB — POC OCCULT BLOOD, ED: Fecal Occult Bld: NEGATIVE

## 2021-06-06 LAB — RESP PANEL BY RT-PCR (FLU A&B, COVID) ARPGX2
Influenza A by PCR: NEGATIVE
Influenza B by PCR: NEGATIVE
SARS Coronavirus 2 by RT PCR: NEGATIVE

## 2021-06-06 LAB — D-DIMER, QUANTITATIVE: D-Dimer, Quant: 3.29 ug/mL-FEU — ABNORMAL HIGH (ref 0.00–0.50)

## 2021-06-06 LAB — PHOSPHORUS: Phosphorus: 4.6 mg/dL (ref 2.5–4.6)

## 2021-06-06 MED ORDER — TRAMADOL HCL 50 MG PO TABS
50.0000 mg | ORAL_TABLET | Freq: Two times a day (BID) | ORAL | Status: DC
  Administered 2021-06-06 – 2021-06-07 (×2): 50 mg via ORAL
  Filled 2021-06-06 (×2): qty 1

## 2021-06-06 MED ORDER — METHYLPREDNISOLONE SODIUM SUCC 125 MG IJ SOLR
125.0000 mg | Freq: Once | INTRAMUSCULAR | Status: AC
Start: 2021-06-06 — End: 2021-06-06
  Administered 2021-06-06: 125 mg via INTRAVENOUS
  Filled 2021-06-06: qty 2

## 2021-06-06 MED ORDER — LACTATED RINGERS IV BOLUS (SEPSIS)
500.0000 mL | Freq: Once | INTRAVENOUS | Status: AC
  Administered 2021-06-06: 500 mL via INTRAVENOUS

## 2021-06-06 MED ORDER — ACETAMINOPHEN 325 MG PO TABS: 650.0000 mg | ORAL_TABLET | Freq: Four times a day (QID) | ORAL | Status: DC | PRN

## 2021-06-06 MED ORDER — POLYETHYLENE GLYCOL 3350 17 G PO PACK: 17.0000 g | PACK | Freq: Every day | ORAL | Status: DC | PRN

## 2021-06-06 MED ORDER — LACTATED RINGERS IV BOLUS
1000.0000 mL | Freq: Once | INTRAVENOUS | Status: AC
Start: 2021-06-06 — End: 2021-06-06
  Administered 2021-06-06: 1000 mL via INTRAVENOUS

## 2021-06-06 MED ORDER — ONDANSETRON HCL 4 MG PO TABS: 4.0000 mg | ORAL_TABLET | Freq: Four times a day (QID) | ORAL | Status: DC | PRN

## 2021-06-06 MED ORDER — SENNOSIDES-DOCUSATE SODIUM 8.6-50 MG PO TABS: 1.0000 | ORAL_TABLET | Freq: Every evening | ORAL | Status: DC | PRN

## 2021-06-06 MED ORDER — LACTATED RINGERS IV SOLN: INTRAVENOUS | Status: AC

## 2021-06-06 MED ORDER — ENOXAPARIN SODIUM 40 MG/0.4ML IJ SOSY
40.0000 mg | PREFILLED_SYRINGE | INTRAMUSCULAR | Status: DC
  Administered 2021-06-06: 40 mg via SUBCUTANEOUS
  Filled 2021-06-06: qty 0.4

## 2021-06-06 MED ORDER — SODIUM CHLORIDE 0.9 % IV SOLN
3.0000 g | Freq: Three times a day (TID) | INTRAVENOUS | Status: DC
  Administered 2021-06-06 – 2021-06-07 (×2): 3 g via INTRAVENOUS
  Filled 2021-06-06 (×3): qty 8

## 2021-06-06 MED ORDER — ONDANSETRON HCL 4 MG/2ML IJ SOLN: 4.0000 mg | Freq: Four times a day (QID) | INTRAMUSCULAR | Status: DC | PRN

## 2021-06-06 MED ORDER — ACETAMINOPHEN 650 MG RE SUPP: 650.0000 mg | Freq: Four times a day (QID) | RECTAL | Status: DC | PRN

## 2021-06-06 MED ORDER — DOCUSATE SODIUM 100 MG PO CAPS: 100.0000 mg | ORAL_CAPSULE | Freq: Two times a day (BID) | ORAL | Status: DC | PRN

## 2021-06-06 MED ORDER — NICOTINE 21 MG/24HR TD PT24
21.0000 mg | MEDICATED_PATCH | Freq: Every day | TRANSDERMAL | Status: DC
  Administered 2021-06-06: 21 mg via TRANSDERMAL
  Filled 2021-06-06: qty 1

## 2021-06-06 MED ORDER — SODIUM CHLORIDE 0.9 % IV SOLN
100.0000 mg | Freq: Two times a day (BID) | INTRAVENOUS | Status: DC
  Administered 2021-06-06 (×2): 100 mg via INTRAVENOUS
  Filled 2021-06-06 (×3): qty 100

## 2021-06-06 MED ORDER — METHADONE HCL 10 MG/ML PO CONC
60.0000 mg | Freq: Every day | ORAL | Status: DC
  Administered 2021-06-06: 60 mg via ORAL
  Filled 2021-06-06 (×2): qty 10

## 2021-06-06 MED ORDER — AMPICILLIN-SULBACTAM SODIUM 3 (2-1) G IJ SOLR
3.0000 g | Freq: Once | INTRAMUSCULAR | Status: AC
  Administered 2021-06-06: 3 g via INTRAVENOUS
  Filled 2021-06-06 (×2): qty 8

## 2021-06-06 MED ORDER — HEPARIN SODIUM (PORCINE) 5000 UNIT/ML IJ SOLN
5000.0000 [IU] | Freq: Three times a day (TID) | INTRAMUSCULAR | Status: DC
  Administered 2021-06-07 – 2021-06-12 (×16): 5000 [IU] via SUBCUTANEOUS
  Filled 2021-06-06 (×16): qty 1

## 2021-06-06 MED ORDER — LACTATED RINGERS IV BOLUS (SEPSIS)
1000.0000 mL | Freq: Once | INTRAVENOUS | Status: AC
  Administered 2021-06-06: 1000 mL via INTRAVENOUS

## 2021-06-06 NOTE — Sepsis Progress Note (Signed)
Notified bedside nurse of need to draw repeat lactic acid. 

## 2021-06-06 NOTE — ED Notes (Signed)
Family at bedside given drink and snacks ?

## 2021-06-06 NOTE — H&P (Signed)
?Date: 06/06/2021     ?     ?     ?Patient Name:  David [name] MRN: [id number]  ?DOB: [birth year] Age / Sex: 46 y.o., male   ?PCP: Delene Ruffini, MD    ?     ?     ?Medical Service: Internal Medicine Teaching Service    ?     ?     ?Attending Physician: Dr. Velna Ochs, MD    ?First Contact: Pollyann Savoy, [place] [facility] Pager: [phone]  ?Second Contact: Dr. Coy Saunas Pager: [phone]  ?     ?     ?After Hours (After 5p/  First Contact Pager: [phone]  ?weekends / holidays): Second Contact Pager: [phone]  ? ?Chief Complaint: decreased food intake, labored breathing  ? 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  ? ?Patient was on 15L O2 via Venturi mask and unable to speak at the time of interview so history was obtained from patient's mother at bedside. She reports that he had been diagnosed with laryngeal cancer in October 2022 and underwent two of three scheduled rounds of chemotherapy (denied last round due to feeling ill) and completed radiation in January 2023. Following his radiation, he started having labored breathing at baseline due to soft tissue swelling in his neck. For a few months, he was able to tolerate this and was tolerating meals.  ? ?However, starting in April 2023, mother noticed worsening of his overall health. Since radiation, his throat continues to be swollen and painful with multiple mouth sores. He started having worsening dysphagia and odynophagia and can now only tolerate soft foods, taking no more than a few bites at a time. He also has no appetite despite being on marinol. Endorses occasional nausea. He has been malnourished for the past several  months despite supplementation with Ensure. His weight has decreased from a baseline weight of 130 lb to 96 lb a few weeks ago. He has been offered a PEG tube multiple times over the past several months and refused each time. Mother also notes that patient has had declining energy and sleeps for a majority of the day. Patient seems to be aware of his declining state and has decreasing hope for the future. He has been cancelling all scheduled appointments with PCP, Cardiology, and therapy.  ? ?Due to his ongoing labored breathing since getting radiation and ongoing throat swelling, patient's mother purchased a home oxygen monitor and uses it intermittently. He is not on home supplemental oxygen. A few days ago when she checked the O2, it read 75%. It subsequently went up to the mid-80s and low 90s but mother was concerned and wanted him to be evaluated. When she asked him about it, patient denied any trouble breathing or chest pain. No fevers, chills, headaches, abdominal pain, constipation, diarrhea, or dysuria.  ? ?Of note, there was no significant change in patient's health today causing him to be brought in for evaluation. Mother has noticed a chronic decline and asked patient to be evaluated daily. Usually patient declines, stating that he would prefer to live his life on his own terms but today was agreeable to medical evaluation.  ? ?In the ED, patient was found  to be afebrile but tachycardic, tachypneic, and hypoxemic to 70s. He had leukocytosis to 16.1 with neutrophilic predominance. Lactic acid 8.4. Creatine elevated at 1.73 with baseline of 0.70. BUN /Cr ratio 41.6. Albumin 2.3. AST 50, ALT 22. Electrolytes within normal limits. D-dimer elevated at 3.29. ABG with pH 7.368, pO2 89, pCO2 54.9. UA consistent with dehydration. CXR notable for right sided multilobar pneumonia with parapneumonic effusion. He was placed on 15L O2 via Venturi mask and treated per sepsis protocol. He received 2.5L bolus IVF and was  subsequently started on mIVF. Blood cultures were obtained x2 and antibiotic coverage was initiated with unasyn and doxycycline per pharmacy recommendations. ? ? ? ?Meds:  ?Carvedilol 6.25 mg BID ?Clotrimazole 10 mg five times daily  ?Dronabinol 2.5 mg BID ?Mucinex 600 mg BID ?Methadone 60 mg daily ?Nicotine 1 patch daily  ?Ondansetron 8 mg PRN ?Tramadol 50 mg BID ?Lidocaine 2% swish/swallow as needed ? ?Not taking entresto because not covered by medicaid.  ? ? ?Allergies: ?Allergies as of 06/06/2021  ? (No Known Allergies)  ? ?Past Medical History:  ?Diagnosis Date  ? 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[place])   ? 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 Pneumothorax   ? Left lung spontaneous pneumothorax at age 51 yr   ? ? ?Family History:  ?Obtained from mother. ?MGM- CAD, HTN, metastatic breast cancer ?Mom- Hypothyroidism, HLD ?Distant relatives on maternal side with diabetes mellitus, breast cancer on both sides of family ? ?Social History: Obtained from mother. Patient was previously homeless for an extended period of time but has been living with his mother in a house in [place] since October 2022. Patient's brother and his kids also live in the home. Patient does not have a significant other or any children. He is unemployed but on disability and medicaid. Mother reports active smoking with 1 PPD for the past 20 + years. He has a remote history of heroin and marijuana use and is currently in a Methadone program. Endorses remote alcohol use but quit 6-7 years ago.  ? ?Review of Systems: ?A complete ROS was negative except as per HPI.  ? ?Physical Exam: ?Blood pressure 93/68, pulse 91, temperature 100 ?F (37.8 ?C), temperature source Rectal, resp. rate 20, SpO2 98 %. ?General: Chronically ill appearing and cachectic. Lying in bed with non-rebreather in place and minimally responsive.  ?Neuro: Unable to assess orientation because patient unable to speak with non-rebreather in place. Unable to follow commands.  Does respond to some questions with head nods and shaking.  ?Cardiovascular: RRR, no m/r/g. Normal S1 and S2 without S3 or S4. Radial pulses 2+ bilaterally. Right DP pulse 1+, left DP pulse not palpable. No peripheral edema. Bilateral lower extremities cool to touch.  ?Pulmonary: Exam limited due to patient positioning. Right sided crackles, left side clear to auscultation. Bilateral mild clubbing.  ?Abdominal: Abdomen soft and non-distended. Normoactive bowel sounds. No tenderness to palpation.  ?Skin: Warm and dry with no rashes, cuts, or bruises ? ? ?EKG: personally reviewed my interpretation is sinus tachycardia with prolonged QT ? ?CXR: personally reviewed my interpretation is right sided multilobar opacities with associated pleural effusion.  ? ?Assessment & Plan by Problem: ?Principal Problem: ?  Aspiration pneumonia ([place]) ? 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  ? ?Acute hypoxic respiratory failure with sepsis secondary to aspiration pneumonia ?Patient with history of laryngeal cancer s/p radiation with significant post-radiation changes and chronic dysphagia presents with chronically labored respirations and new hypotension, tachycardia, tachypnea, hypoxemia, and oxygen requirement. CXR notable for right sided multilobar opacities with associated pleural effusion. WBC 73.5 with neutrophilic predominance. Lactic acid 8.4. Urine strep pneumoniae antigen positive. Clinically, patient continues to be tachypneic but comfortable and maintaining adequate oxygen saturation on 55% FiO2 at 15L/min via Venturi mask. Patient has clearly expressed desire to be DNI.  ?- S/p 2.5L bolus IVF, now on mIVF at 150  mL/hr ?- S/p methylprednisolone x1 in ED ?- Follow blood cultures ?- Urine legionella pending  ?- Continue Unasyn and Doxycycline per pharmacy recommendation  ?- Consider chest CT if worsening  ?- Pain

## 2021-06-06 NOTE — Progress Notes (Signed)
Pharmacy Antibiotic Note ? ?David [name] is a 46 y.o. male admitted on 06/06/2021 with pneumonia.  Pharmacy has been consulted for unasyn dosing. ? ?WBC 23.4, LA 8.4, Scr 1.73 ? ?Plan: ?Unasyn 3g q8h ?Doxycyline 100 mg q12h per MD ?F/u cultures, LOT, ability to de-escalate as appropriate ? ?  ? ?Temp (24hrs), Avg:99.1 ?F (37.3 ?C), Min:98.1 ?F (36.7 ?C), Max:100 ?F (37.8 ?C) ? ?Recent Labs  ?Lab 06/06/21 ?1010 06/06/21 ?1453  ?WBC 23.4*  --   ?CREATININE 1.73*  --   ?LATICACIDVEN 8.4* 1.6  ?  ?CrCl cannot be calculated (Unknown ideal weight.).   ? ?No Known Allergies ? ?Antimicrobials this admission: ?5/10 unasyn >>  ?5/10 doxycycline >>  ? ?Dose adjustments this admission: ?N/A ? ?Microbiology results: ?5/10 BCx: sent ? ? ?Thank you for allowing pharmacy to be a part of this patient?s care. ? ?Eduard Clos Ingvald Theisen ?06/06/2021 7:58 PM ? ?

## 2021-06-06 NOTE — ED Notes (Signed)
Coughing up mucous without difficulty.  Oxygen Sats remain below 90%. ?

## 2021-06-06 NOTE — ED Provider Notes (Signed)
?David [name] ?Provider Note ? ? ?CSN: [id number] ?Arrival date & time: 06/06/21  5643 ? ?  ? ?History ? ?Chief Complaint  ?Patient presents with  ? Shortness of Breath  ? Respiratory Distress  ? Anorexia  ? ? ?David [name] is a 46 y.o. male. ? 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 ?Mother is present in the department, patient states he does not wish that we speak to her about his care ?46 year old male presents today with reports that he has not been able to eat for extended period of time and has had decreased p.o. intake.  He states that he is not short of breath.  He has been having some cough.  Is unclear whether this has been productive.  He denies any pain.  He has generalized weakness.  He reports decreased urinary output.  He states that he had been told that he needed a feeding tube but he had been resisting this.  He states that he would like to be able to have the feeding tube placed now. 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 ? ?  ?Patient states that he would wish to have a breathing tube if needed ?Home Medications ?Prior to Admission medications   ?Medication Sig Start Date End Date Taking? Authorizing Provider  ?amoxicillin-clavulanate (AUGMENTIN) 875-125 MG tablet Take 1 tablet by mouth every 12 (twelve) hours. 04/06/21   Luna Fuse, MD  ?carvedilol (COREG) 6.25 MG tablet Take 1 tablet (6.25 mg total) by mouth 2 (two) times daily. 04/25/21 04/25/22  Delene Ruffini, MD  ?clotrimazole (MYCELEX) 10 MG troche Take 1 tablet (10 mg total) by mouth 5 (five) times daily. Suck on these slowly. 02/26/21   [name], MD  ?dronabinol (MARINOL) 2.5 MG capsule Take 2.5 mg by mouth 2 (two) times daily before a meal. ?Patient not taking: Reported on 03/31/2021    [provider]  ?guaiFENesin  (MUCINEX) 600 MG 12 hr tablet Take 2 tablets (1,200 mg total) by mouth 2 (two) times daily. 02/26/21   Pickenpack-Cousar, Carlena Sax, NP  ?lidocaine (XYLOCAINE) 2 % solution Patient: Mix 1part 2% viscous lidocaine, 1part H20. Swish/swallow 67m of diluted mixture, 350m before meals and '@bedtime'$ , up to QID prn soreness 02/26/21   SqEppie GibsonMD  ?methadone (DOLOPHINE) 10 MG/ML solution Take 6 mLs (60 mg total) by mouth daily. 01/08/21   Pickenpack-Cousar, AtCarlena SaxNP  ?nicotine (NICODERM CQ - DOSED IN MG/24 HOURS) 14 mg/24hr patch Place 1 patch (14 mg total) onto the skin daily. Apply 21 mg patch daily x 6 wk, then '14mg'$  patch daily x 2 wk, then 7 mg patch daily x 2 wk 02/26/21   SqEppie GibsonMD  ?ondansetron (ZOFRAN) 8 MG tablet Take 1 tablet (8 mg total) by mouth every 8 (eight) hours as needed for nausea or vomiting. Starting day 3 after chemotherapy 05/17/21   IrBenay PikeMD  ?sacubitril-valsartan (ENTRESTO) 49-51 MG Take 1 tablet by mouth 2 (two) times daily. 04/25/21   GaDelene RuffiniMD  ?traMADol (ULTRAM) 50 MG tablet Take 1 tablet (50 mg total) by mouth 2 (two) times daily. 02/26/21   Pickenpack-Cousar, AtCarlena SaxNP  ?   ? ?Allergies    ?Patient has no known allergies.   ? ?Review of Systems   ?Review of Systems  ?All other systems reviewed and  are negative. ? ?Physical Exam ?Updated Vital Signs ?BP 104/74   Pulse (!) 113   Temp 100 ?F (37.8 ?C) (Rectal)   Resp (!) 24   SpO2 93%  ?Physical Exam ?Vitals and nursing note reviewed.  ?Constitutional:   ?   General: He is in acute distress.  ?   Appearance: He is ill-appearing.  ?HENT:  ?   Head: Normocephalic and atraumatic.  ?   Mouth/Throat:  ?   Comments: Mucous membranes are dry ?Poor dentition ?Eyes:  ?   Pupils: Pupils are equal, round, and reactive to light.  ?Cardiovascular:  ?   Rate and Rhythm: Regular rhythm. Tachycardia present.  ?Pulmonary:  ?   Effort: Tachypnea and accessory muscle usage present.  ?   Breath sounds: Examination of the  right-lower field reveals rhonchi. Examination of the left-lower field reveals rhonchi. Rhonchi present.  ?Abdominal:  ?   Palpations: Abdomen is soft.  ?Musculoskeletal:     ?   General: Normal range of motion.  ?   Cervical back: Normal range of motion.  ?   Right lower leg: No edema.  ?   Left lower leg: No edema.  ?Skin: ?   General: Skin is warm.  ?   Capillary Refill: Capillary refill takes less than 2 seconds.  ?   Coloration: Skin is pale.  ?Neurological:  ?   General: No focal deficit present.  ?   Mental Status: He is alert.  ? ? ?ED Results / Procedures / Treatments   ?Labs ?(all labs ordered are listed, but only abnormal results are displayed) ?Labs Reviewed  ?LACTIC ACID, PLASMA - Abnormal; Notable for the following components:  ?    Result Value  ? Lactic Acid, Venous 8.4 (*)   ? All other components within normal limits  ?COMPREHENSIVE METABOLIC PANEL - Abnormal; Notable for the following components:  ? Chloride 95 (*)   ? BUN 72 (*)   ? Creatinine, Ser 1.73 (*)   ? Albumin 2.3 (*)   ? AST 50 (*)   ? GFR, Estimated 49 (*)   ? Anion gap 18 (*)   ? All other components within normal limits  ?CBC WITH DIFFERENTIAL/PLATELET - Abnormal; Notable for the following components:  ? WBC 23.4 (*)   ? Platelets 463 (*)   ? Neutro Abs 21.7 (*)   ? Lymphs Abs 0.4 (*)   ? Monocytes Absolute 1.1 (*)   ? Abs Immature Granulocytes 0.12 (*)   ? All other components within normal limits  ?MAGNESIUM - Abnormal; Notable for the following components:  ? Magnesium 2.6 (*)   ? All other components within normal limits  ?D-DIMER, QUANTITATIVE - Abnormal; Notable for the following components:  ? D-Dimer, Quant 3.29 (*)   ? All other components within normal limits  ?I-STAT ARTERIAL BLOOD GAS, ED - Abnormal; Notable for the following components:  ? pCO2 arterial 54.5 (*)   ? Bicarbonate 31.4 (*)   ? TCO2 33 (*)   ? Acid-Base Excess 5.0 (*)   ? All other components within normal limits  ?CULTURE, BLOOD (ROUTINE X 2)  ?CULTURE,  BLOOD (ROUTINE X 2)  ?RESP PANEL BY RT-PCR (FLU A&B, COVID) ARPGX2  ?PHOSPHORUS  ?LACTIC ACID, PLASMA  ?URINALYSIS, ROUTINE W REFLEX MICROSCOPIC  ?POC OCCULT BLOOD, ED  ? ? ?EKG ?EKG Interpretation ? ?Date/Time:  Wednesday Jun 06 2021 09:54:32 EDT ?Ventricular Rate:  120 ?PR Interval:  76 ?QRS Duration: 90 ?QT Interval:  378 ?QTC  Calculation: 534 ?R Axis:   68 ?Text Interpretation: Poor data quality, interpretation may be adversely affected Sinus tachycardia with short PR Minimal voltage criteria for LVH, may be normal variant ( Cornell product ) Prolonged QT Abnormal ECG When compared with ECG of 01-Apr-2021 02:48, PREVIOUS ECG IS PRESENT Confirmed by Pattricia Boss [phone]) on 06/06/2021 12:14:19 PM ? ?Radiology ?DG Chest Port 1 View ? ?Result Date: 06/06/2021 ?CLINICAL DATA:  Shortness of breath. EXAM: PORTABLE CHEST 1 VIEW COMPARISON:  PET 05/14/2021 and chest radiograph 04/06/2021. FINDINGS: Trachea is midline. Heart size normal. Dense airspace consolidation in the right upper, right middle and right lower lobes. Air-fluid level is seen in lower right hemithorax, laterally. Associated right pleural fluid. Left lung is clear. Gaseous distension of bowel in the upper abdomen. IMPRESSION: 1. Right-sided multilobar pneumonia. Difficult to exclude associated necrosis or abscess. Consider CT chest with contrast in further evaluation, as clinically indicated. Of note, no evidence of hypermetabolic malignancy in the chest on PET 05/14/2021. 2. Right parapneumonic effusion. 3. Gaseous distension of bowel in the upper abdomen. Electronically Signed   By: Lorin Picket M.D.   On: 06/06/2021 11:26   ? ?Procedures ?Marland KitchenCritical Care ?Performed by: Pattricia Boss, MD ?Authorized by: Pattricia Boss, MD  ? ?Critical care provider statement:  ?  Critical care time (minutes):  75 ?  Critical care end time:  06/06/2021 12:34 PM ?  Critical care was necessary to treat or prevent imminent or life-threatening deterioration of the following  conditions:  Respiratory failure and sepsis  ? ? ?Medications Ordered in ED ?Medications  ?Ampicillin-Sulbactam (UNASYN) 3 g in sodium chloride 0.9 % 100 mL IVPB (has no administration in time range)  ?doxycycli

## 2021-06-06 NOTE — Progress Notes (Signed)
SLP Cancellation Note ? ?Patient Details ?Name: David [name] ?MRN: [id number] ?DOB: [birth year] ? ? ?Cancelled treatment:       Reason Eval/Treat Not Completed: Patient not medically ready. Patient with labored breathing on Venturi mask, not achieving any voicing and has h/o severe pharyngeal phase dysphagia putting him at high aspiration risk. SLP is recommending continue NPO status and will follow up next date. ? ? ?Sonia Baller, MA, CCC-SLP ?Speech Therapy ? ?

## 2021-06-06 NOTE — ED Notes (Signed)
Pt resting in bed with his eyes closed. Second abx started and repeat lactic collected. Lights dimmed and pt repositioned. No complaints at this time. Plan of care continues.  ?

## 2021-06-06 NOTE — Consult Note (Signed)
?Consultation Note ?Date: 06/06/2021  ? ?Patient Name: David [name]  ?DOB: 02-04-1975  MRN: [id number]  Age / Sex: 46 y.o., male  ?PCP: Delene Ruffini, MD ?Referring Physician: Velna Ochs, MD ? ?Reason for Consultation: Establishing goals of care, "patient with h/o head and neck ca and does not wish intubation or aggressive care" ? ?HPI/Patient Profile: 46 y.o. male  with past medical history of laryngeal cancer s/p chemo and radiation and heroin abuse presented to ED on 06/06/21 from home with complaints of shortness of breath and not being able to eat for several days. Patient was admitted on 06/06/2021 with acute hypoxic respiratory failure with sepsis secondary to aspiration pneumonia, elevated D-dimer, AKI.  Per conversations with CCM patient would not want intubation and now DNR/DNI.  Patient has adamantly declined feeding tubes in the past, but now states he would be open to receiving one. ? ?Of note, patient is followed by outpatient Palliative Care at [facility]. ? ?Clinical Assessment and Goals of Care: ?I have reviewed medical records including EPIC notes, labs, and imaging. Received report from primary RN -no acute concerns but states he "looks bad."  States he is not eating or drinking.   ? ?Went to visit patient at bedside -mother/David [name], aunt/David [name], and pastor present.  [name] was offering patient and family prayer.  Patient was lying in bed awake, alert, oriented, and able to participate in conversation though at times he is hard to understand -he is extremely frail and cachectic, his words are soft and at times unintelligible. No signs or non-verbal gestures of pain or discomfort noted. No respiratory distress, increased work of breathing, or secretions noted. He is on the Venturi mask in addition to nasal cannula. ? ?Patient requested our meeting be held privately -led family to ED conference  room. ? ?Met with patient alone to discuss diagnosis, prognosis, GOC, EOL wishes, disposition, and options. ? ?I re-introduced Palliative Medicine as specialized medical care for people living with serious illness. It focuses on providing relief from the symptoms and stress of a serious illness. The goal is to improve quality of life for both the patient and the family. ? ?We discussed patient's current illness and what it means in the larger context of patient's on-going co-morbidities.  Patient does not seem to have a clear understanding of his acute current medical situation.  Education provided on acute hypoxic respiratory failure with sepsis secondary to aspiration pneumonia and AKI.  We reviewed his history of laryngeal cancer and his high risk for recurrent aspiration even with PEG tube.  Natural disease trajectory and expectations at EOL were discussed. I attempted to elicit values and goals of care important to the patient. The difference between aggressive medical intervention and comfort care was considered in light of the patient's goals of care.  Patient was not willing to discuss full goals of care.  He is clear that he is open to pursue PEG tube if stable enough.  He also wants to continue current medical interventions -IV fluids and IV antibiotics. He did not want to discuss goals in the event of a decline - he tells me "that's not going to happen" and told me to "get out." I also attempted to clarify intubation and he wasn't willing to discuss.  ? ?Attempted to clarify medical decision maker in the event he is unable to make decisions for himself -patient states he would want his decision maker to be "[name]."  Encourage patient to consider a physical being on this earth -  he does state it would be his mother. ? ?Patient was unwilling to answer any further questions.. ? ?Met with patient's mother/David [name] and aunt/David [name] in ED conference room. ? ?We discussed a brief life review of the patient as well as  functional and nutritional status.  Patient is not married and has no children.  Due to his drug abuse he also does not/has not worked.  Patient lived in an apartment for 3 years prior to being kicked out which led him to homelessness for several years.  Patient was diagnosed with laryngeal cancer November 20, 2020 which is when he went to live with his mother.  He has lived with her since this time where he has been functionally independent.  Patient received radiation and chemotherapy -he completed 2 of 3 chemotherapy sessions and finished radiation February 09, 2021.  David [name] reports seeing a significant decline in patient's overall condition 3 weeks ago.  Patient began refusing doctors appointments.  As of this past Sunday patient seemed to have breathing difficulty, sleeping more, with his only oral intake to be bites/sips.  David [name] reports patient refused to go to the ER or to call EMS for several days. ? ?We discussed patient's current illness and what it means in the larger context of patient's on-going co-morbidities.  Patient's family has a clear understanding of his current acute medical situation.  We reviewed his history of laryngeal cancer and his high risk for recurrent aspiration even with PEG tube.  Natural disease trajectory and expectations at EOL were discussed. I attempted to elicit values and goals of care important to the patient. The difference between aggressive medical intervention and comfort care was considered in light of the patient's goals of care.  Provided update that patient was not willing to discuss full goals of care.  He is clear that he is open to pursue PEG tube if stable enough.  He also wants to continue current medical interventions-IV fluids and IV antibiotics. He did not want to discuss goals in the event of a decline. I also attempted to clarify intubation and he wasn't willing to discuss.  Family understand that he is at high risk for decline. ? ?We talked about transition to  comfort measures in house and what that would entail inclusive of medications to control pain, dyspnea, agitation, nausea, and itching. We discussed stopping all unnecessary measures such as blood draws, needle sticks, oxygen, antibiotics, CBGs/insulin, cardiac monitoring, IVF, and frequent vital signs.  ? ?David [name] understands that if patient is unable to make his own decisions with medical team would look to her to make decisions for him.  In the event of a decline she does seem open for transition to full comfort care. ? ?Discussed with patient/family the importance of continued conversation with each other and the medical providers regarding overall plan of care and treatment options, ensuring decisions are within the context of the patient?s values and GOCs.   ? ?Questions and concerns were addressed. The patient/family was encouraged to call with questions and/or concerns. PMT card was provided. ? ? ?Primary Decision Maker: ?PATIENT ?  ? ?SUMMARY OF RECOMMENDATIONS   ?Continue current medical interventions ?Continue DNR/DNI as previously documented - patient was not willing to discuss thoughts on intubation with me today ?Patient was not willing to discuss goals in the event of a decline; however, patient's mother does seem open for transition to full comfort care  ?Patient is clear he wishes to pursue PEG tube when/if stable enough for procedure ?PMT will  continue to follow and support holistically ? ? ? ?Code Status/Advance Care Planning: ?DNR ? ?Palliative Prophylaxis:  ?Aspiration, Bowel Regimen, Eye Care, Frequent Pain Assessment, Palliative Wound Care, and Turn Reposition ? ?Additional Recommendations (Limitations, Scope, Preferences): ?Full Scope Treatment ? ?Psycho-social/Spiritual:  ?Desire for further Chaplaincy support:no ?Created space and opportunity for patient and family to express thoughts and feelings regarding patient's current medical situation.  ?Emotional support and therapeutic listening  provided. ? ?Prognosis:  ?< 6 months would not be surprising ? ?Discharge Planning: To Be Determined  ? ?  ? ?Primary Diagnoses: ?Present on Admission: ? Aspiration pneumonia ([place]) ? ? ?I have reviewed the medical

## 2021-06-06 NOTE — Progress Notes (Addendum)
10 PM: Paged by Dr. Matilde Sprang in the ED that patient's oxygen saturation is down to the 70s.  He put patient on BiPAP with improvement of respiratory status. ? ?When seen at bedside, patient appears fatigued but without any overt respiratory distress.  He is on 100% BiPAP and satting in the low 90s.  Patient is a daily smoker.  No obvious wheezing heard on lung auscultation.  Diminished breath sounds in the right lung base.  Patient is not tachycardic.  Blood pressure is normal. ? ?When confirmed CODE STATUS, patient changed his mind and would like to be full code.  This is confirmed with his mother who was in the room as well.  Even though his respiratory status is stable now, I am worried about his impending respiratory failure in setting of severe pneumonia.  I spoke to Dr. Lamonte Sakai with PCCM who will reevaluate the patient. ? ?11:20 PM: Patient has ICU transfer order ?

## 2021-06-06 NOTE — ED Triage Notes (Signed)
Mom/pt stated, Unable to breath good and cant eat started not able to eat 4 days ago. ?

## 2021-06-06 NOTE — ED Notes (Signed)
Around 21:45 after coughing episode patient's oxygen saturation remained in in the low to mid 80's.  Earlier patient would recover after coughing episode.  At 19:30 he was changed from a 55% venti mask to a partial re-breather at 15 lpm.  He was able to maintain his oxygen saturations in the mid to upper 90s until a coughing episode around 21:45.  22:00 MD at bedside to evaluate.  22:10 patient placed on bi-pap by respiratory and his saturations are now in the mid 90's. ?

## 2021-06-06 NOTE — Consult Note (Signed)
? ?NAME:  David [name], MRN:  [id number], DOB:  1975/06/06, LOS: 0 ?ADMISSION DATE:  06/06/2021, CONSULTATION DATE: 06/06/2021 ?REFERRING MD: Emergency department physician, CHIEF COMPLAINT: Acute respiratory distress ? 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  Presents with acute respiratory distress with 1 question by the pulmonary critical care team about intubation he stated he did not want to be intubated and was changing CODE STATUS to DO NOT RESUSCITATE.  He does have a history of polysubstance abuse notable for his last evaluation at [facility] on 04/01/2021 positive for amphetamines.  He is on Marinol, methadone on his home MAR.  He was examined at the bedside by Dr. Tacy Learn CODE STATUS has been changed to DNR should he change his mind pulmonary critical care be available as needed. ? ?Pertinent  Medical History  ? ?Past Medical History:  ?Diagnosis Date  ? 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 [facility])   ? Pneumothorax   ? Left lung spontaneous pneumothorax at age 37 yr   ? ? ? ?Significant Hospital Events: ?Including procedures, antibiotic start and stop dates in addition to other pertinent events   ?06/06/2021 changed to DNR status ? ?Interim History / Subjective:  ?46 year old male in no acute distress ? ?Objective   ?Blood pressure 104/74, pulse (!) 113, temperature 100 ?F (37.8 ?C), temperature source Rectal, resp. rate (!) 24, SpO2 93 %. ?   ?FiO2 (%):  [55 %-100 %] 55 %  ?No intake or output data in the 24 hours ending 06/06/21 1219 ?There were no vitals filed for this visit. ? ?Examination: ?General: Severely cachectic male ?HENT: Obvious radiation marks on neck ?Lungs: Decreased breath sounds in the bases ?Cardiovascular: Heart sounds are regular rate and rhythm ?Abdomen: Obviously malnourished positive bowel ?Extremities: Muscle wasting ?Neuro: Grossly intact  without focal defect ? ? ?Resolved Hospital Problem list   ? ? ?Assessment & Plan:  ?Acute on chronic respiratory distress in the setting of stage IV head neck cancer status postsurgical intervention.  Pulmonary critical care called to bedside for urgent intubation with patient has expressed DNR request therefore no intubation at this time. ?Pulmonary critical care available as needed ?Continue supplemental ?Consider full comfort ?Antibiotics per primary team ? ? ?Best Practice (right click and "Reselect all SmartList Selections" daily)  ? ?Diet/type: NPO ?DVT prophylaxis: not indicated ?GI prophylaxis: PPI ?Lines: N/A ?Foley:  N/A ?Code Status:  DNR ?Last date of multidisciplinary goals of care discussion [tbd] ? ?Labs   ?CBC: ?Recent Labs  ?Lab 06/06/21 ?1010 06/06/21 ?1043  ?WBC 23.4*  --   ?NEUTROABS 21.7*  --   ?HGB 13.6 13.9  ?HCT 42.6 41.0  ?MCV 93.6  --   ?PLT 463*  --   ? ? ?Basic Metabolic Panel: ?Recent Labs  ?Lab 06/06/21 ?1010 06/06/21 ?1043  ?NA 139 138  ?K 4.6 4.0  ?CL 95*  --   ?CO2 26  --   ?GLUCOSE 86  --   ?BUN 72*  --   ?CREATININE 1.73*  --   ?CALCIUM 10.2  --   ?MG 2.6*  --   ?PHOS 4.6  --   ? ?GFR: ?CrCl cannot be calculated (Unknown ideal weight.). ?Recent Labs  ?Lab 06/06/21 ?1010  ?WBC 23.4*  ?LATICACIDVEN 8.4*  ? ? ?Liver Function Tests: ?Recent Labs  ?Lab 06/06/21 ?1010  ?AST 50*  ?ALT 22  ?ALKPHOS 63  ?  BILITOT 1.0  ?PROT 7.2  ?ALBUMIN 2.3*  ? ?No results for input(s): LIPASE, AMYLASE in the last 168 hours. ?No results for input(s): AMMONIA in the last 168 hours. ? ?ABG ?   ?Component Value Date/Time  ? PHART 7.368 06/06/2021 1043  ? PCO2ART 54.5 (H) 06/06/2021 1043  ? PO2ART 89 06/06/2021 1043  ? HCO3 31.4 (H) 06/06/2021 1043  ? TCO2 33 (H) 06/06/2021 1043  ? O2SAT 96 06/06/2021 1043  ?  ? ?Coagulation Profile: ?No results for input(s): INR, PROTIME in the last 168 hours. ? ?Cardiac Enzymes: ?No results for input(s): CKTOTAL, CKMB, CKMBINDEX, TROPONINI in the last 168  hours. ? ?HbA1C: ?No results found for: HGBA1C ? ?CBG: ?No results for input(s): GLUCAP in the last 168 hours. ? ?Review of Systems:   ?10 point review of system taken, please see HPI for positives and negatives. ? ? 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  ? ?Surgical History:  ? ?Past Surgical History:  ?Procedure Laterality Date  ? chest tubes Left   ?  ? ?Social History:  ? reports that he has been smoking cigarettes. He has been smoking an average of 3 packs per day. He has never used smokeless tobacco. He reports that he does not currently use alcohol after a past usage of about 12.0 standard drinks per week. He reports that he does not currently use drugs after having used the following drugs: IV.  ? ?Family History:  ?His family history is not on file.  ? ?Allergies ?No Known Allergies  ? ?Home Medications  ?Prior to Admission medications   ?Medication Sig Start Date End Date Taking? Authorizing Provider  ?amoxicillin-clavulanate (AUGMENTIN) 875-125 MG tablet Take 1 tablet by mouth every 12 (twelve) hours. 04/06/21   Luna Fuse, MD  ?carvedilol (COREG) 6.25 MG tablet Take 1 tablet (6.25 mg total) by mouth 2 (two) times daily. 04/25/21 04/25/22  Delene Ruffini, MD  ?clotrimazole (MYCELEX) 10 MG troche Take 1 tablet (10 mg total) by mouth 5 (five) times daily. Suck on these slowly. 02/26/21   [name], MD  ?dronabinol (MARINOL) 2.5 MG capsule Take 2.5 mg by mouth 2 (two) times daily before a meal. ?Patient not taking: Reported on 03/31/2021    [provider]  ?guaiFENesin (MUCINEX) 600 MG 12 hr tablet Take 2 tablets (1,200 mg total) by mouth 2 (two) times daily. 02/26/21   Pickenpack-Cousar, Carlena Sax, NP  ?lidocaine (XYLOCAINE) 2 % solution Patient: Mix 1part 2% viscous lidocaine, 1part H20. Swish/swallow 29m of diluted mixture, 362m before meals and '@bedtime'$ , up to QID prn  soreness 02/26/21   SqEppie GibsonMD  ?methadone (DOLOPHINE) 10 MG/ML solution Take 6 mLs (60 mg total) by mouth daily. 01/08/21   Pickenpack-Cousar, AtCarlena SaxNP  ?nicotine (NICODERM CQ - DOSED IN MG/24 HOURS) 14 mg/24hr patch Place 1 patch (14 mg total) onto the skin daily. Apply 21 mg patch daily x 6 wk, then '14mg'$  patch daily x 2 wk, then 7 mg patch daily x 2 wk 02/26/21   SqEppie GibsonMD  ?ondansetron (ZOFRAN) 8 MG tablet Take 1 tablet (8 mg total) by mouth every 8 (eight) hours as needed for nausea or vomiting. Starting day 3 after chemotherapy 05/17/21   IrBenay PikeMD  ?sacubitril-valsartan (ENTRESTO) 49-51 MG Take 1 tablet by mouth 2 (two) times daily. 04/25/21   GaDelene RuffiniMD  ?traMADol (UVeatrice [place]  50 MG tablet Take 1 tablet (50 mg total) by mouth 2 (two) times daily. 02/26/21   Pickenpack-Cousar, Carlena Sax, NP  ?  ? ?Critical care time: 30 min ?  ? ? ?Richardson Landry Takeo Harts ACNP ?Acute Care Nurse Practitioner ?[place] ?Please consult Amion ?06/06/2021, 12:19 PM ? ? ? ?

## 2021-06-06 NOTE — ED Triage Notes (Signed)
Mom stated, his throat is so swollen the Dr. Junie Spencer see his voice box from the radiation ?

## 2021-06-06 NOTE — Sepsis Progress Note (Signed)
Code sepsis protocol being monitored by eLink. 

## 2021-06-06 NOTE — Sepsis Progress Note (Signed)
Notified bedside nurse of need to administer antibiotics.  

## 2021-06-06 NOTE — ED Provider Notes (Signed)
?[place] ?Provider Note ? ? ?CSN: [id number] ?Arrival date & time: 06/06/21  7017 ? ?  ? ?History ? ?Chief Complaint  ?Patient presents with  ? Shortness of Breath  ? Respiratory Distress  ? Anorexia  ? ? ?David [name] is a 46 y.o. male with stage IV laryngeal malignancy that is now in remission and previous opioid use disorder on methadone who presents to the ED with shortness of breath. According to the patient's mother, the patient has not been able to breath well and has not been eating as much over the last 4 days. The patient states that he does not feel short of breath and that he has just had difficulties with eating. He has been told he needs a feeding tube in the past, however, he adamantly declined this previously and is now open to getting a feeding tube. He denies any fevers, chills, chest pain, abd pain, n/v/d. He does endorse feeling generally weak and has had a decrease in urine output.  ? ?The history is provided by the patient. No language interpreter was used.  ? ?  ? ?Home Medications ?Prior to Admission medications   ?Medication Sig Start Date End Date Taking? Authorizing Provider  ?amoxicillin-clavulanate (AUGMENTIN) 875-125 MG tablet Take 1 tablet by mouth every 12 (twelve) hours. 04/06/21   Luna Fuse, MD  ?carvedilol (COREG) 6.25 MG tablet Take 1 tablet (6.25 mg total) by mouth 2 (two) times daily. 04/25/21 04/25/22  Delene Ruffini, MD  ?clotrimazole (MYCELEX) 10 MG troche Take 1 tablet (10 mg total) by mouth 5 (five) times daily. Suck on these slowly. 02/26/21   [name], MD  ?dronabinol (MARINOL) 2.5 MG capsule Take 2.5 mg by mouth 2 (two) times daily before a meal. ?Patient not taking: Reported on 03/31/2021    [provider]  ?guaiFENesin (MUCINEX) 600 MG 12 hr tablet Take 2 tablets (1,200 mg total) by mouth 2 (two) times daily. 02/26/21   Pickenpack-Cousar, Carlena Sax, NP  ?lidocaine (XYLOCAINE) 2 % solution Patient: Mix  1part 2% viscous lidocaine, 1part H20. Swish/swallow 20m of diluted mixture, 370m before meals and '@bedtime'$ , up to QID prn soreness 02/26/21   SqEppie GibsonMD  ?methadone (DOLOPHINE) 10 MG/ML solution Take 6 mLs (60 mg total) by mouth daily. 01/08/21   Pickenpack-Cousar, AtCarlena SaxNP  ?nicotine (NICODERM CQ - DOSED IN MG/24 HOURS) 14 mg/24hr patch Place 1 patch (14 mg total) onto the skin daily. Apply 21 mg patch daily x 6 wk, then '14mg'$  patch daily x 2 wk, then 7 mg patch daily x 2 wk 02/26/21   SqEppie GibsonMD  ?ondansetron (ZOFRAN) 8 MG tablet Take 1 tablet (8 mg total) by mouth every 8 (eight) hours as needed for nausea or vomiting. Starting day 3 after chemotherapy 05/17/21   IrBenay PikeMD  ?sacubitril-valsartan (ENTRESTO) 49-51 MG Take 1 tablet by mouth 2 (two) times daily. 04/25/21   GaDelene RuffiniMD  ?traMADol (ULTRAM) 50 MG tablet Take 1 tablet (50 mg total) by mouth 2 (two) times daily. 02/26/21   Pickenpack-Cousar, AtCarlena SaxNP  ?   ? ?Allergies    ?Patient has no known allergies.   ? ?Review of Systems   ?Review of Systems ? ?Physical Exam ?Updated Vital Signs ?BP 104/74   Pulse (!) 113   Temp 100 ?F (37.8 ?C) (Rectal)   Resp (!) 24   SpO2 93%  ?Physical Exam ?Constitutional:   ?   General: He is in  acute distress.  ?   Appearance: He is ill-appearing.  ?   Comments: Cachectic   ?HENT:  ?   Head: Normocephalic and atraumatic.  ?Eyes:  ?   Extraocular Movements: Extraocular movements intact.  ?   Pupils: Pupils are equal, round, and reactive to light.  ?Cardiovascular:  ?   Rate and Rhythm: Regular rhythm. Tachycardia present.  ?   Pulses: Normal pulses.  ?   Heart sounds: No murmur heard. ?Pulmonary:  ?   Effort: Tachypnea and accessory muscle usage present.  ?   Breath sounds: Examination of the right-lower field reveals rhonchi. Examination of the left-lower field reveals rhonchi. Rhonchi present. No decreased breath sounds or wheezing.  ?   Comments: Hoarse voice ?Abdominal:  ?    General: Bowel sounds are normal.  ?   Palpations: Abdomen is soft.  ?   Tenderness: There is no abdominal tenderness.  ?Musculoskeletal:  ?   Right lower leg: No edema.  ?   Left lower leg: No edema.  ?Skin: ?   General: Skin is warm and dry.  ?Neurological:  ?   General: No focal deficit present.  ?   Mental Status: He is alert and oriented to person, place, and time.  ? ? ?ED Results / Procedures / Treatments   ?Labs ?(all labs ordered are listed, but only abnormal results are displayed) ?Labs Reviewed  ?LACTIC ACID, PLASMA - Abnormal; Notable for the following components:  ?    Result Value  ? Lactic Acid, Venous 8.4 (*)   ? All other components within normal limits  ?COMPREHENSIVE METABOLIC PANEL - Abnormal; Notable for the following components:  ? Chloride 95 (*)   ? BUN 72 (*)   ? Creatinine, Ser 1.73 (*)   ? Albumin 2.3 (*)   ? AST 50 (*)   ? GFR, Estimated 49 (*)   ? Anion gap 18 (*)   ? All other components within normal limits  ?CBC WITH DIFFERENTIAL/PLATELET - Abnormal; Notable for the following components:  ? WBC 23.4 (*)   ? Platelets 463 (*)   ? Neutro Abs 21.7 (*)   ? Lymphs Abs 0.4 (*)   ? Monocytes Absolute 1.1 (*)   ? Abs Immature Granulocytes 0.12 (*)   ? All other components within normal limits  ?MAGNESIUM - Abnormal; Notable for the following components:  ? Magnesium 2.6 (*)   ? All other components within normal limits  ?D-DIMER, QUANTITATIVE - Abnormal; Notable for the following components:  ? D-Dimer, Quant 3.29 (*)   ? All other components within normal limits  ?I-STAT ARTERIAL BLOOD GAS, ED - Abnormal; Notable for the following components:  ? pCO2 arterial 54.5 (*)   ? Bicarbonate 31.4 (*)   ? TCO2 33 (*)   ? Acid-Base Excess 5.0 (*)   ? All other components within normal limits  ?CULTURE, BLOOD (ROUTINE X 2)  ?CULTURE, BLOOD (ROUTINE X 2)  ?RESP PANEL BY RT-PCR (FLU A&B, COVID) ARPGX2  ?PHOSPHORUS  ?LACTIC ACID, PLASMA  ?URINALYSIS, ROUTINE W REFLEX MICROSCOPIC  ?POC OCCULT BLOOD, ED   ? ? ?EKG ?None ? ?Radiology ?DG Chest Port 1 View ? ?Result Date: 06/06/2021 ?CLINICAL DATA:  Shortness of breath. EXAM: PORTABLE CHEST 1 VIEW COMPARISON:  PET 05/14/2021 and chest radiograph 04/06/2021. FINDINGS: Trachea is midline. Heart size normal. Dense airspace consolidation in the right upper, right middle and right lower lobes. Air-fluid level is seen in lower right hemithorax, laterally. Associated right pleural fluid. Left  lung is clear. Gaseous distension of bowel in the upper abdomen. IMPRESSION: 1. Right-sided multilobar pneumonia. Difficult to exclude associated necrosis or abscess. Consider CT chest with contrast in further evaluation, as clinically indicated. Of note, no evidence of hypermetabolic malignancy in the chest on PET 05/14/2021. 2. Right parapneumonic effusion. 3. Gaseous distension of bowel in the upper abdomen. Electronically Signed   By: Lorin Picket M.D.   On: 06/06/2021 11:26   ? ?Procedures ?Procedures  ? ? ?Medications Ordered in ED ?Medications  ?Ampicillin-Sulbactam (UNASYN) 3 g in sodium chloride 0.9 % 100 mL IVPB (has no administration in time range)  ?doxycycline (VIBRAMYCIN) 100 mg in sodium chloride 0.9 % 250 mL IVPB (has no administration in time range)  ?lactated ringers infusion (has no administration in time range)  ?lactated ringers bolus 1,000 mL (has no administration in time range)  ?  And  ?lactated ringers bolus 500 mL (has no administration in time range)  ?lactated ringers bolus 1,000 mL (1,000 mLs Intravenous New Bag/Given 06/06/21 1036)  ?methylPREDNISolone sodium succinate (SOLU-MEDROL) 125 mg/2 mL injection 125 mg (125 mg Intravenous Given 06/06/21 1209)  ? ? ?ED Course/ Medical Decision Making/ A&P ?Clinical Course as of 06/06/21 1211  ?Wed Jun 06, 2021  ?1129 Chest x-ray reviewed with right multilobar pneumonia worse in the right lower lobe ?Radiologist interpretation notes no signs of malignancy on chest and PET scan 05/14/2021 [DR]  ?1207 Magnesium(!): 2.6  [DR]  ?  ?Clinical Course User Index ?[DR] Pattricia Boss, MD  ? ?                        ?Medical Decision Making ?Amount and/or Complexity of Data Reviewed ?External Data Reviewed: labs, radiology and notes. ?Labs:

## 2021-06-06 NOTE — ED Provider Triage Note (Signed)
Emergency Medicine Provider Triage Evaluation Note ? ?David [name] , a 46 y.o. male  was evaluated in triage.  Pt complains of fatigue. Hx stage 4 throat cancer, haven't been eating for the past 2 weeks, feels fatigue, sob.  Found to be hypoxic with O2 74%.  Full code ? ?Review of Systems  ?Positive: As above ?Negative: As above ? ?Physical Exam  ?BP (!) 142/127 (BP Location: Right Arm)   Pulse (!) 112   Resp (!) 26   SpO2 (!) 74%  ?Gen:   Awake, tachypneic, in moderate resp discomfort ?Resp:  tachypneic ?MSK:   Moves extremities without difficulty  ?Other:  Frail appearing, dry ? ?Medical Decision Making  ?Medically screening exam initiated at 9:55 AM.  Appropriate orders placed.  David [name] was informed that the remainder of the evaluation will be completed by another provider, this initial triage assessment does not replace that evaluation, and the importance of remaining in the ED until their evaluation is complete. ? ? ?  ?Domenic Moras, PA-C ?06/06/21 [phone] ? ?

## 2021-06-06 NOTE — IPAL (Signed)
Interdisciplinary Goals of Care Family Meeting ? ? ?Date carried out:: 06/06/2021 ? ?Location of the meeting: Bedside ? ?Member's involved: Physician, Bedside Registered Nurse, Respiratory Therapist, Pharmacist, and Family Member or next of kin ? ?Durable Power of Tour manager: [name] Culberth   ? ?Discussion: We discussed goals of care for [facility] .   ? ?The Clinical status was relayed to patient himself and his mother at bedside in detail. ?  ?Updated and notified of patients medical condition. ?    ?Patient is having a weak cough and struggling to remove secretions.   ?Patient with increased WOB and using accessory muscles to breathe ?Explained to family course of therapy and the modalities  ?  ?Patient with Progressive weakness and prior history of laryngeal cancer, would not want to be placed on ventilator even if he is struggling to breath. ? ?Code status: Full DNR ? ?Disposition: Continue current acute care while keeping DNR/DNI ? ?  ?Family are satisfied with Plan of action and management. All questions answered ?  ?Jacky Kindle MD ?[facility] ?See Amion for pager ?If no response to pager, please call [phone] until 7pm ?After 7pm, Please call E-link [phone] ? ? ? ?

## 2021-06-07 ENCOUNTER — Inpatient Hospital Stay (HOSPITAL_COMMUNITY): Payer: Medicaid Other

## 2021-06-07 ENCOUNTER — Institutional Professional Consult (permissible substitution): Payer: Medicaid Other | Admitting: Behavioral Health

## 2021-06-07 DIAGNOSIS — A419 Sepsis, unspecified organism: Secondary | ICD-10-CM | POA: Diagnosis not present

## 2021-06-07 DIAGNOSIS — J690 Pneumonitis due to inhalation of food and vomit: Secondary | ICD-10-CM | POA: Diagnosis not present

## 2021-06-07 DIAGNOSIS — J9601 Acute respiratory failure with hypoxia: Secondary | ICD-10-CM | POA: Diagnosis not present

## 2021-06-07 DIAGNOSIS — R7881 Bacteremia: Secondary | ICD-10-CM | POA: Diagnosis not present

## 2021-06-07 DIAGNOSIS — B953 Streptococcus pneumoniae as the cause of diseases classified elsewhere: Secondary | ICD-10-CM | POA: Diagnosis not present

## 2021-06-07 DIAGNOSIS — Z7189 Other specified counseling: Secondary | ICD-10-CM | POA: Diagnosis not present

## 2021-06-07 DIAGNOSIS — Z515 Encounter for palliative care: Secondary | ICD-10-CM | POA: Diagnosis not present

## 2021-06-07 LAB — BLOOD CULTURE ID PANEL (REFLEXED) - BCID2

## 2021-06-07 LAB — CBC WITH DIFFERENTIAL/PLATELET
Abs Immature Granulocytes: 0 10*3/uL (ref 0.00–0.07)
Basophils Absolute: 0 10*3/uL (ref 0.0–0.1)
Basophils Relative: 0 %
Eosinophils Absolute: 0 10*3/uL (ref 0.0–0.5)
Eosinophils Relative: 0 %
HCT: 34.5 % — ABNORMAL LOW (ref 39.0–52.0)
Hemoglobin: 11.3 g/dL — ABNORMAL LOW (ref 13.0–17.0)
Lymphocytes Relative: 1 %
Lymphs Abs: 0.1 10*3/uL — ABNORMAL LOW (ref 0.7–4.0)
MCH: 30.1 pg (ref 26.0–34.0)
MCHC: 32.8 g/dL (ref 30.0–36.0)
MCV: 91.8 fL (ref 80.0–100.0)
Monocytes Absolute: 0.2 10*3/uL (ref 0.1–1.0)
Monocytes Relative: 2 %
Neutro Abs: 11.2 10*3/uL — ABNORMAL HIGH (ref 1.7–7.7)
Neutrophils Relative %: 97 %
Platelets: 154 10*3/uL (ref 150–400)
RBC: 3.76 MIL/uL — ABNORMAL LOW (ref 4.22–5.81)
RDW: 14.6 % (ref 11.5–15.5)
Smear Review: ADEQUATE
WBC: 11.5 10*3/uL — ABNORMAL HIGH (ref 4.0–10.5)
nRBC: 0 % (ref 0.0–0.2)

## 2021-06-07 LAB — BASIC METABOLIC PANEL
Anion gap: 9 (ref 5–15)
BUN: 39 mg/dL — ABNORMAL HIGH (ref 6–20)
CO2: 32 mmol/L (ref 22–32)
Calcium: 8.8 mg/dL — ABNORMAL LOW (ref 8.9–10.3)
Chloride: 100 mmol/L (ref 98–111)
Creatinine, Ser: 0.6 mg/dL — ABNORMAL LOW (ref 0.61–1.24)
GFR, Estimated: >60 mL/min (ref >60)
Glucose, Bld: 58 mg/dL — ABNORMAL LOW (ref 70–99)
Potassium: 3 mmol/L — ABNORMAL LOW (ref 3.5–5.1)
Sodium: 141 mmol/L (ref 135–145)

## 2021-06-07 LAB — MAGNESIUM: Magnesium: 1.8 mg/dL (ref 1.7–2.4)

## 2021-06-07 LAB — CBC
HCT: 36.9 % — ABNORMAL LOW (ref 39.0–52.0)
Hemoglobin: 11.8 g/dL — ABNORMAL LOW (ref 13.0–17.0)
MCH: 29.9 pg (ref 26.0–34.0)
MCHC: 32 g/dL (ref 30.0–36.0)
MCV: 93.4 fL (ref 80.0–100.0)
Platelets: 174 10*3/uL (ref 150–400)
RBC: 3.95 MIL/uL — ABNORMAL LOW (ref 4.22–5.81)
RDW: 14.4 % (ref 11.5–15.5)
WBC: 11.4 10*3/uL — ABNORMAL HIGH (ref 4.0–10.5)
nRBC: 0 % (ref 0.0–0.2)

## 2021-06-07 LAB — PROCALCITONIN: Procalcitonin: 14.71 ng/mL

## 2021-06-07 LAB — GLUCOSE, CAPILLARY
Glucose-Capillary: 120 mg/dL — ABNORMAL HIGH (ref 70–99)
Glucose-Capillary: 56 mg/dL — ABNORMAL LOW (ref 70–99)
Glucose-Capillary: 91 mg/dL (ref 70–99)
Glucose-Capillary: 100 mg/dL — ABNORMAL HIGH (ref 70–99)
Glucose-Capillary: 109 mg/dL — ABNORMAL HIGH (ref 70–99)
Glucose-Capillary: 114 mg/dL — ABNORMAL HIGH (ref 70–99)

## 2021-06-07 LAB — PHOSPHORUS: Phosphorus: 2.7 mg/dL (ref 2.5–4.6)

## 2021-06-07 MED ORDER — FOLIC ACID 5 MG/ML IJ SOLN
1.0000 mg | Freq: Every day | INTRAMUSCULAR | Status: DC
  Administered 2021-06-07: 1 mg via INTRAVENOUS
  Filled 2021-06-07 (×2): qty 0.2

## 2021-06-07 MED ORDER — DEXTROSE 10 % IV SOLN: INTRAVENOUS | Status: DC

## 2021-06-07 MED ORDER — DEXTROSE 50 % IV SOLN
12.5000 g | INTRAVENOUS | Status: AC
  Administered 2021-06-07: 12.5 g via INTRAVENOUS

## 2021-06-07 MED ORDER — MAGNESIUM SULFATE 2 GM/50ML IV SOLN
2.0000 g | Freq: Once | INTRAVENOUS | Status: AC
  Administered 2021-06-07: 2 g via INTRAVENOUS
  Filled 2021-06-07: qty 50

## 2021-06-07 MED ORDER — ORAL CARE MOUTH RINSE
15.0000 mL | Freq: Two times a day (BID) | OROMUCOSAL | Status: DC
  Administered 2021-06-07 – 2021-06-08 (×3): 15 mL via OROMUCOSAL

## 2021-06-07 MED ORDER — SODIUM CHLORIDE 0.9 % IV SOLN
2.0000 g | INTRAVENOUS | Status: AC
  Administered 2021-06-07 – 2021-06-13 (×7): 2 g via INTRAVENOUS
  Filled 2021-06-07 (×7): qty 20

## 2021-06-07 MED ORDER — METHADONE HCL 10 MG/ML PO CONC: 50.0000 mg | Freq: Every day | ORAL | Status: DC

## 2021-06-07 MED ORDER — DEXTROSE 50 % IV SOLN
INTRAVENOUS | Status: AC
  Filled 2021-06-07: qty 50

## 2021-06-07 MED ORDER — METHADONE HCL 5 MG PO TABS
50.0000 mg | ORAL_TABLET | Freq: Every day | ORAL | Status: DC
  Administered 2021-06-07: 50 mg via ORAL
  Filled 2021-06-07: qty 10

## 2021-06-07 MED ORDER — THIAMINE HCL 100 MG/ML IJ SOLN
500.0000 mg | Freq: Three times a day (TID) | INTRAVENOUS | Status: AC
  Administered 2021-06-07 – 2021-06-09 (×9): 500 mg via INTRAVENOUS
  Filled 2021-06-07 (×9): qty 5

## 2021-06-07 MED ORDER — CHLORHEXIDINE GLUCONATE CLOTH 2 % EX PADS
6.0000 | MEDICATED_PAD | Freq: Every day | CUTANEOUS | Status: DC
  Administered 2021-06-07 – 2021-06-08 (×2): 6 via TOPICAL

## 2021-06-07 MED ORDER — POTASSIUM CHLORIDE 10 MEQ/100ML IV SOLN
10.0000 meq | INTRAVENOUS | Status: AC
  Administered 2021-06-07 (×6): 10 meq via INTRAVENOUS
  Filled 2021-06-07 (×2): qty 100

## 2021-06-07 MED ORDER — CHLORHEXIDINE GLUCONATE 0.12 % MT SOLN
15.0000 mL | Freq: Two times a day (BID) | OROMUCOSAL | Status: DC
  Administered 2021-06-07 – 2021-06-08 (×3): 15 mL via OROMUCOSAL
  Filled 2021-06-07 (×3): qty 15

## 2021-06-07 NOTE — Progress Notes (Addendum)
? ?NAME:  David [name], MRN:  [id number], DOB:  [birth year], LOS: 1 ?ADMISSION DATE:  06/06/2021, CONSULTATION DATE: 06/06/2021 ?REFERRING MD: Emergency department physician, CHIEF COMPLAINT: Acute respiratory distress ? 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  Presents with acute respiratory distress with 1 question by the pulmonary critical care team about intubation he stated he did not want to be intubated and was changing CODE STATUS to DO NOT RESUSCITATE.  He does have a history of polysubstance abuse notable for his last evaluation at [facility] on 04/01/2021 positive for amphetamines.  He is on Marinol, methadone on his home MAR.  He was examined at the bedside by Dr. Tacy Learn CODE STATUS has been changed to DNR should he change his mind pulmonary critical care be available as needed. ? ?Pertinent  Medical History  ? ?Past Medical History:  ?Diagnosis Date  ? 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 [facility])   ? Pneumothorax   ? Left lung spontaneous pneumothorax at age 54 yr   ? ? ? ?Significant Hospital Events: ?Including procedures, antibiotic start and stop dates in addition to other pertinent events   ?06/06/2021 changed to DNR status ?06/06/2021 changed back to full code ? ?Interim History / Subjective:  ?Currently in respiratory distress needs intubation ? ?Objective   ?Blood pressure 106/73, pulse 83, temperature 97.6 ?F (36.4 ?C), temperature source Axillary, resp. rate 18, weight 38.2 kg, SpO2 97 %. ?   ?Vent Mode: PCV;BIPAP ?FiO2 (%):  [55 %-100 %] 70 % ?Set Rate:  [15 bmp] 15 bmp ?PEEP:  [8 cmH20] 8 cmH20  ? ?Intake/Output Summary (Last 24 hours) at 06/07/2021 0751 ?Last data filed at 06/07/2021 0455 ?Gross per 24 hour  ?Intake 1453.33 ml  ?Output 200 ml  ?Net 1253.33 ml  ? ?Filed Weights  ? 06/06/21 2000 06/07/21 0653  ?Weight: 42.3 kg 38.2 kg   ? ? ?Examination: ?General: Frail cachectic male currently on BiPAP ?HEENT: Radiation scars noted, poor dentition ?Neuro: Lethargic but arousable ?CV: Heart sounds are regular with murmur ?PULM: Decreased air movement throughout ?Vent currently on BiPAP ?FIO2 70% ?Sats 94% ?GI: soft, bsx4 active  ? ?Extremities: warm/dry, negative edema  ?Skin: no rashes or lesions  ? ? ?Resolved Hospital Problem list   ? ? ?Assessment & Plan:  ?Acute on chronic respiratory distress in the setting of stage IV head neck cancer status post chemo/rtx intervention.  Pulmonary critical care called to bedside for urgent intubation with patient has expressed DNR request therefore no intubation at this time.  Pulmonary critical care called back to bedside 01/06/2022 changed at this time full code.  Continues to aspirate continues to decline feeding tube. Seen by ENT [facility] Dr. Bethann Humble who recommended tracheostomy back on 05-28-21. ? ?Noninvasive mechanical Support. ?Low threshold for intubation ?Most likely will require awake intubation plus or minus tracheostomy ?May require urgent tracheostomy at bedside and or in operating room. ?Dr. Constance Holster of ENT paged. ? ?Bacteremia ?Positive strep pneumo urinary ? ?Continue current antimicrobial therapy ? ?History of heart failure with 2D echo concerning EF of 20% ?Careful fluid balance ?May need cardiology consult ? ?Ongoing polysubstance abuse, continues to smoke, continues to drink daily, noted to be positive for amphetamines approximately 2 days ?Monitor for withdrawal ?Thiamine and folic acid ? ?Hypokalemia ?Monitor and replete as needed ? ?Refeeding syndrome ?Currently being loaded with thiamine ? ? ? ? ? ? ? ?  Best Practice (right click and "Reselect all SmartList Selections" daily)  ? ?Diet/type: NPO ?DVT prophylaxis: not indicated ?GI prophylaxis: PPI ?Lines: N/A ?Foley:  N/A ?Code Status:  full code ?Last date of multidisciplinary goals of care discussion [tbd] ? ?Labs   ?CBC: ?Recent Labs  ?Lab  06/06/21 ?1010 06/06/21 ?1043 06/06/21 ?2326 06/07/21 ?0235  ?WBC 23.4*  --   --  11.4*  ?NEUTROABS 21.7*  --   --   --   ?HGB 13.6 13.9 10.9* 11.8*  ?HCT 42.6 41.0 32.0* 36.9*  ?MCV 93.6  --   --  93.4  ?PLT 463*  --   --  174  ? ? ?Basic Metabolic Panel: ?Recent Labs  ?Lab 06/06/21 ?1010 06/06/21 ?1043 06/06/21 ?2326 06/07/21 ?0235  ?NA 139 138 142 141  ?K 4.6 4.0 3.0* 3.0*  ?CL 95*  --   --  100  ?CO2 26  --   --  32  ?GLUCOSE 86  --   --  58*  ?BUN 72*  --   --  39*  ?CREATININE 1.73*  --   --  0.60*  ?CALCIUM 10.2  --   --  8.8*  ?MG 2.6*  --   --  1.8  ?PHOS 4.6  --   --  2.7  ? ?GFR: ?Estimated Creatinine Clearance: 62.3 mL/min (A) (by C-G formula based on SCr of 0.6 mg/dL (L)). ?Recent Labs  ?Lab 06/06/21 ?1010 06/06/21 ?1453 06/07/21 ?0235  ?WBC 23.4*  --  11.4*  ?LATICACIDVEN 8.4* 1.6  --   ? ? ?Liver Function Tests: ?Recent Labs  ?Lab 06/06/21 ?1010  ?AST 50*  ?ALT 22  ?ALKPHOS 63  ?BILITOT 1.0  ?PROT 7.2  ?ALBUMIN 2.3*  ? ?No results for input(s): LIPASE, AMYLASE in the last 168 hours. ?No results for input(s): AMMONIA in the last 168 hours. ? ?ABG ?   ?Component Value Date/Time  ? PHART 7.394 06/06/2021 2326  ? PCO2ART 66.3 ([place]) 06/06/2021 2326  ? PO2ART 216 (H) 06/06/2021 2326  ? HCO3 40.5 (H) 06/06/2021 2326  ? TCO2 42 (H) 06/06/2021 2326  ? O2SAT 100 06/06/2021 2326  ?  ? ?Coagulation Profile: ?No results for input(s): INR, PROTIME in the last 168 hours. ? ?Cardiac Enzymes: ?No results for input(s): CKTOTAL, CKMB, CKMBINDEX, TROPONINI in the last 168 hours. ? ?HbA1C: ?No results found for: HGBA1C ? ?CBG: ?Recent Labs  ?Lab 06/07/21 ?0650 06/07/21 ?0720  ?GLUCAP 56* 120*  ? ? ? ? ? ?Critical care time: 30 min ?  ? ? ?Richardson Landry Celesta Funderburk ACNP ?Acute Care Nurse Practitioner ?[name] ?Please consult Amion ?06/07/2021, 7:51 AM ? ? ? ?

## 2021-06-07 NOTE — Progress Notes (Signed)
Pharmacy Electrolyte Replacement ? ?Recent Labs: ? ?Recent Labs  ?  06/07/21 ?0235  ?K 3.0*  ?MG 1.8  ?PHOS 2.7  ?CREATININE 0.60*  ? ? ?Low Critical Values (K </= 2.5, Phos </= 1, Mg </= 1) Present: ?None ? ?MD Contacted: N/a ? ?Patient NPO on BiPAP. SCr improving.  ? ?Plan: KCl 38mq IV x6 + Magnesium 2g IV x1.  ?Recheck in AM.  ? ?JSloan Leiter PharmD, BCPS, BCCCP ?Clinical Pharmacist ?Please refer to [facility] [place] ?06/07/2021, 7:16 AM ? ?

## 2021-06-07 NOTE — Progress Notes (Signed)
SLP Cancellation Note ? ?Patient Details ?Name: David [name] ?MRN: [id number] ?DOB: 06-02-1975 ? ? ?Cancelled treatment:       Reason Eval/Treat Not Completed: Patient not medically ready. Pt with severe pharyngeal dysphagia, does need SLP when medically stable, but for now pts priority is airway stability. Will sign off at this time. Please reorder when appropriate.  ? ? ?Jonathin Heinicke, Katherene Ponto ?06/07/2021, 8:26 AM ?

## 2021-06-07 NOTE — Progress Notes (Signed)
Pt transported by RN and RT from ED 37 to ED 34 w/o complications. ?

## 2021-06-07 NOTE — Progress Notes (Addendum)
Initial Nutrition Assessment ? ?DOCUMENTATION CODES:  ? ?Severe malnutrition in context of chronic illness, Underweight ? ?INTERVENTION:  ? ?If within goals of care, recommend Cortrak placement and initiation of tube feeding: ?Osmolite 1.5 at 20 ml/h, increase by 10 ml every 12 hours to goal rate of 45 ml/h (1080 ml per day). ? ?Provides 1620 kcal, 68 gm protein, 823 ml free water daily. ? ?When nutrition is started, monitor magnesium, potassium, and phosphorus BID for at least 3 days, MD to replete as needed, as pt is at risk for refeeding syndrome given severe malnutrition. ? ?NUTRITION DIAGNOSIS:  ? ?Severe Malnutrition related to chronic illness (head and neck cancer) as evidenced by severe muscle depletion, severe fat depletion, percent weight loss (23% weight loss within 3 months). ? ?GOAL:  ? ?Patient will meet greater than or equal to 90% of their needs ? ?MONITOR:  ? ?Diet advancement, Labs, I & O's, TF tolerance ? ?REASON FOR ASSESSMENT:  ? ?Consult ?Assessment of nutrition requirement/status ? ?ASSESSMENT:  ? ?46 yo male admitted with respiratory failure, suspected aspiration PNA. 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 ? ?Discussed patient in ICU rounds and with RN today. ?Currently requiring BiPAP. ?Patient unable to provide any nutrition history. ?Per chart review patient has had difficulty swallowing since XRT and is likely aspirating. He will need a swallow evaluation with SLP before beginning a PO diet. He has refused a trach and feeding tube in the past, but may be agreeable now. Per IR patient is not appropriate for g-tube placement at this time d/t respiratory instability. If intubation needed, plans to place trach at that time by ENT.  ?Palliative Care has been consulted to determine goals of care.  ? ?Labs reviewed. K 3 ?CBG: 56-120-91 ? ?Medications reviewed and include folic acid,  methadone, IV potassium chloride, IV thiamine. ?IVF: D10 at 50 ml/h. ? ?Weight history reviewed.  ?Patient has had 23% weight loss within the past 3 months. Suspect this is related to minimal intake with severe dysphagia.  ? ?Patient meets criteria for severe malnutrition with severe weight loss, and severe depletion of muscle and subcutaneous fat mass.  ? ?NUTRITION - FOCUSED PHYSICAL EXAM: ? ?Flowsheet Row Most Recent Value  ?Orbital Region Severe depletion  ?Upper Arm Region Severe depletion  ?Thoracic and Lumbar Region Severe depletion  ?Buccal Region Severe depletion  ?Temple Region Severe depletion  ?Clavicle Bone Region Severe depletion  ?Clavicle and Acromion Bone Region Severe depletion  ?Scapular Bone Region Severe depletion  ?Dorsal Hand Severe depletion  ?Patellar Region Severe depletion  ?Anterior Thigh Region Severe depletion  ?Posterior Calf Region Severe depletion  ?Edema (RD Assessment) None  ?Hair Reviewed  ?Eyes Unable to assess  ?Mouth Unable to assess  ?Skin Reviewed  ?Nails Reviewed  ? ?  ? ? ?Diet Order:   ?Diet Order   ? ?       ?  Diet NPO time specified  Diet effective now       ?  ? ?  ?  ? ?  ? ? ?EDUCATION NEEDS:  ? ?Not appropriate for education at this time ? ?Skin:  Skin Assessment: Reviewed RN Assessment ? ?Last BM:  no BM documented ? ?Height:  ? ?Ht Readings from Last 1 Encounters:  ?06/07/21 '5\' 11"'$  (1.803 m)  ? ? ?Weight:  ? ?Wt Readings from Last 1 Encounters:  ?06/07/21 38.2 kg  ? ? ?Ideal  Body Weight:  78.2 kg ? ?BMI:  Body mass index is 11.75 kg/m?. ? ?Estimated Nutritional Needs:  ? ?Kcal:  1500-1700 ? ?Protein:  65-75 gm ? ?Fluid:  1.5-1.7 L ? ? ? ?[place] Mallow RD, LDN, CNSC ?Please refer to Amion for contact information.                                                       ? ?

## 2021-06-07 NOTE — Progress Notes (Signed)
?                                                                                                                                                     ?                                                   ?Daily Progress Note  ? ?Patient Name: David [name]       Date: 06/07/2021 ?DOB: 01/11/1976  Age: 46 y.o. MRN#: [id number] ?Attending Physician: Julian Hy, DO ?Primary Care Physician: Delene Ruffini, MD ?Admit Date: 06/06/2021 ? ?Reason for Consultation/Follow-up: Establishing goals of care ? ?Subjective: ?Chart review performed.  Received report from primary RN. ? ?Went to visit patient at bedside -mother/[name] and aunt/Alice present.  Patient was lying in bed asleep -I did not attempt to wake him.  No signs or non-verbal gestures of pain or discomfort noted. No respiratory distress, increased work of breathing, or secretions noted.  BiPAP in use. ? ?Emotional support provided to family.  Provided detailed discussion and education on current plan of care, BiPAP use and expectations, tracheostomy procedure and expectations.  Provided updates that patient is not stable for PEG tube placement at this time due to respiratory status.  Again reviewed that initiation of PEG tube/tube feeds may not change patient's overall outcome or decrease risk of aspiration. ? ?Family confirmed patient's goal to proceed with PEG tube and trach as previously discussed with CCM.  Family understands that patient remains at high risk for decline and transition to comfort care in the instance of decline would be reasonable considering his chronically frail and ill state. ? ?All questions and concerns addressed. Encouraged to call with questions and/or concerns. PMT card previously provided. ? ?Discussed case and provided updates to CCM. ? ?Length of Stay: 1 ? ?Current Medications: ?Scheduled Meds:  ? chlorhexidine  15 mL Mouth Rinse BID  ? Chlorhexidine Gluconate Cloth  6 each Topical Daily  ? folic acid  1 mg Intravenous Daily   ? heparin  5,000 Units Subcutaneous Q8H  ? mouth rinse  15 mL Mouth Rinse q12n4p  ? methadone  50 mg Oral Daily  ? traMADol  50 mg Oral BID  ? ? ?Continuous Infusions: ? cefTRIAXone (ROCEPHIN)  IV 2 g (06/07/21 0950)  ? dextrose 50 mL/hr at 06/07/21 0817  ? potassium chloride 10 mEq (06/07/21 1104)  ? thiamine injection 500 mg (06/07/21 1105)  ? ? ?PRN Meds: ?acetaminophen **OR** acetaminophen, docusate sodium, polyethylene glycol, senna-docusate ? ?Physical Exam ?Vitals and nursing note reviewed.  ?Constitutional:   ?   General: He is not in acute distress. ?   Appearance: He is cachectic. He  is ill-appearing.  ?Pulmonary:  ?   Effort: No respiratory distress.  ?   Comments: BiPAP in use ?Skin: ?   General: Skin is warm and dry.  ?Neurological:  ?   Motor: Weakness present.  ?         ? ?Vital Signs: BP 90/65   Pulse 81   Temp (!) 97.1 ?F (36.2 ?C) (Skin)   Resp 18   Wt 38.2 kg   SpO2 96%   BMI 11.75 kg/m?  ?SpO2: SpO2: 96 % ?O2 Device: O2 Device: Bi-PAP ?O2 Flow Rate: O2 Flow Rate (L/min): 15 L/min ? ?Intake/output summary:  ?Intake/Output Summary (Last 24 hours) at 06/07/2021 1125 ?Last data filed at 06/07/2021 0800 ?Gross per 24 hour  ?Intake 2807.73 ml  ?Output 200 ml  ?Net 2607.73 ml  ? ?LBM:   ?Baseline Weight: Weight: 42.3 kg ?Most recent weight: Weight: 38.2 kg ? ?     ?Palliative Assessment/Data: PPS 10-20% ? ? ? ? ? ?Patient Active Problem List  ? Diagnosis Date Noted  ? Aspiration pneumonia ([place]) 06/06/2021  ? 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 [facility])   ? Heart failure with reduced ejection fraction ([place]) 04/28/2021  ? Odynophagia 04/28/2021  ? Anxiety associated with cancer diagnosis ([place]) 04/28/2021  ? CAP (community acquired pneumonia) 04/01/2021  ? Elevated troponin 04/01/2021  ? Elevated transaminase level 04/01/2021  ? Mucositis due to antineoplastic therapy 02/14/2021  ? Drug abuse and dependence ([place]) 01/15/2021  ? Weight loss, unintentional 01/15/2021  ? Protein-calorie  malnutrition, severe 01/10/2021  ? Hypomagnesemia 01/09/2021  ? Hypokalemia 01/09/2021  ? Pancytopenia ([place]) 01/09/2021  ? Hypotension 01/08/2021  ? AKI (acute kidney injury) ([place]) 01/08/2021  ? Hyponatremia 01/08/2021  ? Leukopenia due to antineoplastic chemotherapy ([place]) 01/08/2021  ? SIRS (systemic inflammatory response syndrome) ([place]) 01/08/2021  ? Chemotherapy induced nausea and vomiting 01/01/2021  ? Goals of care, counseling/discussion 12/13/2020  ? Laryngeal cancer ([place]) 12/04/2020  ? Encounter for preoperative dental examination 12/04/2020  ? Teeth missing 12/04/2020  ? Caries 12/04/2020  ? Retained tooth root 12/04/2020  ? 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 Impacted third molar tooth 12/04/2020  ? Attrition, teeth excessive 12/04/2020  ? Chronic periodontitis 12/04/2020  ? Accretions on teeth 12/04/2020  ? Incipient enamel caries 12/04/2020  ? ? ?Palliative Care Assessment & Plan  ? ?Patient Profile: ?46 y.o. male  with past medical history of laryngeal cancer s/p chemo and radiation and heroin abuse presented to ED on 06/06/21 from home with complaints of shortness of breath and not being able to eat for several days. Patient was admitted on 06/06/2021 with acute hypoxic respiratory failure with sepsis secondary to aspiration pneumonia, elevated D-dimer, AKI.  Per conversations with CCM patient would not want intubation and now DNR/DNI.  Patient has adamantly declined feeding tubes in the past, but now states he would be open to receiving one. ?  ?Of note, patient is followed by outpatient Palliative Care at [facility]. ? ?Assessment: ?Principal Problem: ?  Aspiration pneumonia ([place]) ? ? ?Recommendations/Plan: ?Continue full scope care ?Patient now full code and agreeable to trach if medically recommended per notes with CCM and ENT ?Patient previously agreeable to PEG tube placement when medically stable ?PMT will continue to follow and support holistically ? ?Goals of Care and Additional  Recommendations: ?Limitations on Scope of Treatment: Full Scope Treatment ? ?Code Status: ? ?  ?Code Status Orders  ?(From admission, onward)  ?  ? ? ?  ? ?  Start     Ordered  ? 06/06/21 2307  Full code  Continuous       ? 06/06/21 2307  ? ?  ?  ? ?  ? ?Code Status History   ? ? Date Active Date Inactive Code Status Order ID Comments User Context  ? 06/06/2021 2227 06/06/2021 2307 Full Code [id number]  Farrel Gordon, DO ED  ? 06/06/2021 1343 06/06/2021 2227 DNR [id number]  Lacinda Axon, MD ED  ? 06/06/2021 1236 06/06/2021 1343 DNR [id number]  Jacky Kindle, MD ED  ? 04/01/2021 0314 04/01/2021 1802 Full Code [id number]  Shela Leff, MD ED  ? 01/08/2021 1908 01/12/2021 0003 Full Code [id number]  Lenore Cordia, MD ED  ? ?  ? ? ?Prognosis: ? Unable to determine ? ?Discharge Planning: ?To Be Determined ? ?Care plan was discussed with primary RN, patient's family, CCM ? ?Thank you for allowing the Palliative Medicine Team to assist in the care of this patient. ? ? ?Total Time 45 minutes Prolonged Time Billed  no   ? ?   ?Greater than 50%  of this time was spent counseling and coordinating care related to the above assessment and plan. ? ?Lin Landsman, NP ? ?Please contact Palliative Medicine Team phone at [phone] for questions and concerns.  ? ?*Portions of this note are a verbal dictation therefore any spelling and/or grammatical errors are due to the "[place] [employer]" system interpretation. ? ?

## 2021-06-07 NOTE — Progress Notes (Signed)
Pt transported by RN and RT from ED34 to 7O16 w/o complications ?

## 2021-06-07 NOTE — TOC Initial Note (Signed)
Transition of Care (TOC) - Initial/Assessment Note  ? ? ?Patient Details  ?Name: David [name] ?MRN: [id number] ?Date of Birth: [birth year] ? ?Transition of Care (TOC) CM/SW Contact:    ?Verdell Carmine, RN ?Phone Number: ?06/07/2021, 9:05 AM ? ?Clinical Narrative:                 ? ?The Transition of Care Department [facility]) has reviewed patient and no TOC needs have been identified at this time. We will continue to monitor patient advancement through interdisciplinary progression rounds. If new patient transition needs arise, please place a TOC consult ?  ?  ? ? ?Patient Goals and CMS Choice ?  ?  ?  ? ?Expected Discharge Plan and Services ?  ?  ?  ?  ?  ?                ?  ?  ?  ?  ?  ?  ?  ?  ?  ?  ? ?Prior Living Arrangements/Services ?  ?  ?  ?       ?  ?  ?  ?  ? ?Activities of Daily Living ?  ?  ? ?Permission Sought/Granted ?  ?  ?   ?   ?   ?   ? ?Emotional Assessment ?  ?  ?  ?  ?  ?  ? ?Admission diagnosis:  Aspiration pneumonia ([place]) [J69.0] ?Aspiration pneumonia of right lower lobe, unspecified aspiration pneumonia type ([place]) [J69.0] ?Sepsis, due to unspecified organism, unspecified whether acute organ dysfunction present ([place]) [A41.9] ?Patient Active Problem List  ? Diagnosis Date Noted  ? Aspiration pneumonia ([place]) 06/06/2021  ? 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 [facility])   ? Heart failure with reduced ejection fraction ([place]) 04/28/2021  ? Odynophagia 04/28/2021  ? Anxiety associated with cancer diagnosis ([place]) 04/28/2021  ? CAP (community acquired pneumonia) 04/01/2021  ? Elevated troponin 04/01/2021  ? Elevated transaminase level 04/01/2021  ? Mucositis due to antineoplastic therapy 02/14/2021  ? Drug abuse and dependence ([place]) 01/15/2021  ? Weight loss, unintentional 01/15/2021  ? Protein-calorie malnutrition, severe 01/10/2021  ? Hypomagnesemia 01/09/2021  ? Hypokalemia 01/09/2021  ? Pancytopenia ([place]) 01/09/2021  ? Hypotension 01/08/2021  ? AKI (acute kidney injury) ([place])  01/08/2021  ? Hyponatremia 01/08/2021  ? Leukopenia due to antineoplastic chemotherapy ([place]) 01/08/2021  ? SIRS (systemic inflammatory response syndrome) ([place]) 01/08/2021  ? Chemotherapy induced nausea and vomiting 01/01/2021  ? Goals of care, counseling/discussion 12/13/2020  ? Laryngeal cancer ([place]) 12/04/2020  ? Encounter for preoperative dental examination 12/04/2020  ? Teeth missing 12/04/2020  ? Caries 12/04/2020  ? Retained tooth root 12/04/2020  ? 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 Impacted third molar tooth 12/04/2020  ? Attrition, teeth excessive 12/04/2020  ? Chronic periodontitis 12/04/2020  ? Accretions on teeth 12/04/2020  ? Incipient enamel caries 12/04/2020  ? ?PCP:  Delene Ruffini, MD ?Pharmacy:   ?[id number] [place], [place] - [address] ?[id number]?Phone: [phone]: [phone]? ? ? ? ?Social Determinants of Health (SDOH) Interventions ?  ? ?Readmission Risk Interventions ?   ? View : No data to display.  ?  ?  ?  ? ? ? ?

## 2021-06-07 NOTE — Progress Notes (Signed)
Patient transported to 100m2 with no working IVs. IV consult placed. ?

## 2021-06-07 NOTE — Progress Notes (Signed)
Mr. Bostwick had sudden desaturation after his brother gave him mouth swabs. He has coughed but still feels like he has secretions in his throat. Can still speak, no accessory muscle use, no increase from previous mild stridor. NTS, back on BiPAP. Strict NPO. ?

## 2021-06-07 NOTE — Progress Notes (Signed)
Interventional Radiology Brief Note: ? ?IR consulted for percutaneous gastrostomy tube placement on patient with advanced laryngeal cancer, respiratory compromise/distress, and malnutrition vs. Failure to thrive.  ? ?CCM and Palliative Medicine have been working with patient to determine goals of care as patient was initially DNR/DNI, now full code with impending intubation in the setting of worsening respiratory status.  ? ?Patient not appropriate for gastrostomy tube placement at this time.  ?Will cancel order.   ?Recommend re-consulting once patient stable, respiratory state resolved or secured, and pending further discussion with primary care team re: [place].  ?It would be in patient's best interest if he is proceeding with intubation/trach placement that he also be open to proceeding with gastrostomy tube (in IR or with other performing service) as given his history and current fragility it is difficult envision successful PO intake after trach placement. Thus far, patient has been resolutely against gastrostomy tube.  ? ?Brynda Greathouse, MS RD PA-C ? ? ?

## 2021-06-07 NOTE — Progress Notes (Signed)
RT NOTE: patient resting comfortably on bipap this AM.  No respiratory distress noted.  Will continue to leave on bipap at this time.  Will continue to monitor.  ?

## 2021-06-07 NOTE — Progress Notes (Signed)
RT called to patient room due to patient having a decrease in sats and patient stating that he felt like he was choking.  Patient's sats were reading low 80s upon arrival. Patient was placed back on bipap and is currently tolerating well.  Will continue to monitor.  ?

## 2021-06-07 NOTE — Progress Notes (Signed)
2 ultrasound guided PIV's placed due to malposition of 2 placed in ED. Advised primary RN to remove them as 1 causes pain to the patient and the other is in the upper arm with a short catheter and positional, likely due to location.  ? ?Kharter Sestak Lorita Officer, RN ? ?

## 2021-06-07 NOTE — Progress Notes (Signed)
BP 106/73   Pulse 81   Temp 97.6 ?F (36.4 ?C) (Axillary)   Resp 20   Wt 38.2 kg   SpO2 97%   BMI 11.75 kg/m?  ?Comfortably breathing on BiPAP 70%, sleeping, but arousable. [place] in R base. No wheezing.  ? ?I reviewed Dr. Dorathy Kinsman office note from 05/28/21. She had recommended elective tracheostomy at that time due to supraglottic edema and was not able to see his true VC during endoscopy. He had refused. He has refused a PEG tube throughout his course despite ~50# weight loss. His mother reports he has also refused going to see a Cardiologist as recommended. I discussed with he and his mother that with his upper airway anatomy he is very high risk for unsuccessful intubation attempts and requiring trach emergently if we get to the point that he requires MV. He and his mother both agreed. I have reached out to Dr. Fredric Dine and will page ENT on call to discuss his case this morning. Con't bipap and full supportive care. ? ?Julian Hy, DO 06/07/21 7:55 AM ?[facility] ? ?

## 2021-06-07 NOTE — Progress Notes (Signed)
PCCM Interval Progress Note ? ?Called to bedside by IMTS to assess patient's respiratory status.  Patient was reportedly more tachypneic and desaturating, requiring BiPAP initiation.  On examination, patient is lethargic, but answering questions appropriately through BiPAP mask.  He is cachectic, tachypneic with RR 20s, oxygen saturations maintaining between 98 and 100% on BiPAP. ? ?Given multiple goals of care discussions had throughout the day, clarified with patient what his wishes are; he currently wishes to be a full code.  I discussed in detail with him that if BiPAP did not remain adequate to help his respiratory status, the next step would be intubation.  I also explained to the patient that I fear given his critical illness due to pneumonia/cachexia/generalized weakness, if an endotracheal tube is placed, it may not be able to be removed resulting in tracheostomy or eventual removal from mechanical life support.  He expresses understanding and would still like to proceed with intubation if necessary. ? ?In the setting of persistent tachypnea, critical illness/comorbidities, suspected pneumococcal pneumonia and concern for fatiguing with BiPAP, decision made to admit patient to ICU for close monitoring.  Low threshold for intubation if beginning to fatigue with BiPAP, confirmed with patient/patient's mother at bedside that he remains a full code. ? ?Lestine Mount, PA-C ?[facility] ?06/06/2021 11:00PM ? ?Please see [email or web address] for pager details. ? ?From 7A-7P if no response, please call [phone] ?After hours, please call ELink [phone] ?

## 2021-06-07 NOTE — Progress Notes (Signed)
PHARMACY - PHYSICIAN COMMUNICATION ?CRITICAL VALUE ALERT - BLOOD CULTURE IDENTIFICATION (BCID) ? ?David [name] is an 46 y.o. male who presented to [facility] on 06/06/2021 with a chief complaint of Acute hypoxic respiratory failure with sepsis secondary to aspiration pneumonia. 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 ?Assessment:  Streptococcus pneumoniae in 2/4 bottles ? ?Name of physician (or Provider) Contacted: Burnard Leigh ? ?Current antibiotics: Unasyn and Doxycycline  ? ?Changes to prescribed antibiotics recommended: - No changes needed, could consider narrowing therapy ? ?Results for orders placed or performed during the hospital encounter of 06/06/21  ?Blood Culture ID Panel (Reflexed) (Collected: 06/06/2021 10:10 AM)  ?Result Value Ref Range  ? Enterococcus faecalis NOT DETECTED NOT DETECTED  ? Enterococcus Faecium NOT DETECTED NOT DETECTED  ? Listeria monocytogenes NOT DETECTED NOT DETECTED  ? Staphylococcus species NOT DETECTED NOT DETECTED  ? Staphylococcus aureus (BCID) NOT DETECTED NOT DETECTED  ? Staphylococcus epidermidis NOT DETECTED NOT DETECTED  ? Staphylococcus lugdunensis NOT DETECTED NOT DETECTED  ? Streptococcus species DETECTED (A) NOT DETECTED  ? Streptococcus agalactiae NOT DETECTED NOT DETECTED  ? Streptococcus pneumoniae DETECTED (A) NOT DETECTED  ? Streptococcus pyogenes NOT DETECTED NOT DETECTED  ? A.calcoaceticus-baumannii NOT DETECTED NOT DETECTED  ? Bacteroides fragilis NOT DETECTED NOT DETECTED  ? Enterobacterales NOT DETECTED NOT DETECTED  ? Enterobacter cloacae complex NOT DETECTED NOT DETECTED  ? Escherichia coli NOT DETECTED NOT DETECTED  ? Klebsiella aerogenes NOT DETECTED NOT DETECTED  ? Klebsiella oxytoca NOT DETECTED NOT DETECTED  ? Klebsiella pneumoniae NOT DETECTED NOT DETECTED  ? Proteus species NOT DETECTED NOT DETECTED  ? Salmonella species NOT DETECTED NOT DETECTED  ? Serratia marcescens NOT DETECTED NOT DETECTED  ? Haemophilus  influenzae NOT DETECTED NOT DETECTED  ? Neisseria meningitidis NOT DETECTED NOT DETECTED  ? Pseudomonas aeruginosa NOT DETECTED NOT DETECTED  ? Stenotrophomonas maltophilia NOT DETECTED NOT DETECTED  ? Candida albicans NOT DETECTED NOT DETECTED  ? Candida auris NOT DETECTED NOT DETECTED  ? Candida glabrata NOT DETECTED NOT DETECTED  ? Candida krusei NOT DETECTED NOT DETECTED  ? Candida parapsilosis NOT DETECTED NOT DETECTED  ? Candida tropicalis NOT DETECTED NOT DETECTED  ? Cryptococcus neoformans/gattii NOT DETECTED NOT DETECTED  ? ? ?[name] ?06/07/2021  2:36 AM ? ?

## 2021-06-07 NOTE — Consult Note (Signed)
Reason for Consult: Respiratory failure ?Referring Physician: Julian Hy, DO ? ?David [name] is an 46 y.o. male.  ?HPI: Patient known to our practice, patient of Dr. Roseanna Rainbow, had advanced stage laryngeal cancer, treated with radiation therapy, completed a few months ago.  He had been followed as an outpatient by Dr. Isaias Cowman.  He is having severe difficulty swallowing and with aspiration.  He also has significant laryngeal edema.  Recommendations have been made in the past for feeding tube and tracheostomy but he has been adamant about not having either of those done.  He was admitted yesterday with respiratory failure and found to have pneumonia.  It is likely aspiration pneumonia.  He was having difficulty keeping his saturations up and is currently on BiPAP. ? ?Past Medical History:  ?Diagnosis Date  ? 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[place])   ? 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 [facility])   ? Pneumothorax   ? Left lung spontaneous pneumothorax at age 23 yr   ? ? ?Past Surgical History:  ?Procedure Laterality Date  ? chest tubes Left   ? ? ?No family history on file. ? ?Social History:  reports that he has been smoking cigarettes. He has been smoking an average of 3 packs per day. He has never used smokeless tobacco. He reports that he does not currently use alcohol after a past usage of about 12.0 standard drinks per week. He reports that he does not currently use drugs after having used the following drugs: IV. ? ?Allergies: No Known Allergies ? ?Medications: Reviewed ? ?Results for orders placed or performed during the hospital encounter of 06/06/21 (from the past 48 hour(s))  ?Blood culture (routine x 2)     Status: None (Preliminary result)  ? Collection Time: 06/06/21 10:00 AM  ? Specimen: BLOOD  ?Result Value Ref Range  ? Specimen Description BLOOD SITE NOT SPECIFIED   ? Special Requests    ?  BOTTLES DRAWN AEROBIC AND ANAEROBIC Blood Culture results may not be optimal due to an inadequate  volume of blood received in culture bottles  ? Culture    ?  NO GROWTH < 12 HOURS ?Performed at [facility], [place] [address] [place], [place] [address] ?  ? Report Status PENDING   ?Lactic acid, plasma     Status: Abnormal  ? Collection Time: 06/06/21 10:10 AM  ?Result Value Ref Range  ? Lactic Acid, Venous 8.4 (HH) 0.5 - 1.9 mmol/L  ?  Comment: CRITICAL RESULT CALLED TO, READ BACK BY AND VERIFIED WITH: ?Alvino Chapel, RN AT [place] 06/06/2021 BY ZBEECH. ?Performed at [facility], [place] [address], [place], [place] [address] ?  ?Comprehensive metabolic panel     Status: Abnormal  ? Collection Time: 06/06/21 10:10 AM  ?Result Value Ref Range  ? Sodium 139 135 - 145 mmol/L  ? Potassium 4.6 3.5 - 5.1 mmol/L  ? Chloride 95 (L) 98 - 111 mmol/L  ? CO2 26 22 - 32 mmol/L  ? Glucose, Bld 86 70 - 99 mg/dL  ?  Comment: Glucose reference range applies only to samples taken after fasting for at least 8 hours.  ? BUN 72 (H) 6 - 20 mg/dL  ? Creatinine, Ser 1.73 (H) 0.61 - 1.24 mg/dL  ? Calcium 10.2 8.9 - 10.3 mg/dL  ? Total Protein 7.2 6.5 - 8.1 g/dL  ? Albumin 2.3 (L) 3.5 - 5.0 g/dL  ? AST 50 (H) 15 - 41 U/L  ? ALT 22 0 -  44 U/L  ? Alkaline Phosphatase 63 38 - 126 U/L  ? Total Bilirubin 1.0 0.3 - 1.2 mg/dL  ? GFR, Estimated 49 (L) >60 mL/min  ?  Comment: (NOTE) ?Calculated using the CKD-EPI Creatinine Equation (2021) ?  ? Anion gap 18 (H) 5 - 15  ?  Comment: Performed at [facility], [place] [address], [place], [place] [address]  ?CBC with Differential     Status: Abnormal  ? Collection Time: 06/06/21 10:10 AM  ?Result Value Ref Range  ? WBC 23.4 (H) 4.0 - 10.5 K/uL  ? RBC 4.55 4.22 - 5.81 MIL/uL  ? Hemoglobin 13.6 13.0 - 17.0 g/dL  ? HCT 42.6 39.0 - 52.0 %  ? MCV 93.6 80.0 - 100.0 fL  ? MCH 29.9 26.0 - 34.0 pg  ? MCHC 31.9 30.0 - 36.0 g/dL  ? RDW 14.6 11.5 - 15.5 %  ? Platelets 463 (H) 150 - 400 K/uL  ? nRBC 0.0 0.0 - 0.2 %  ? Neutrophils Relative % 92 %  ? Neutro Abs 21.7 (H) 1.7 - 7.7 K/uL  ? Lymphocytes Relative 2  %  ? Lymphs Abs 0.4 (L) 0.7 - 4.0 K/uL  ? Monocytes Relative 5 %  ? Monocytes Absolute 1.1 (H) 0.1 - 1.0 K/uL  ? Eosinophils Relative 0 %  ? Eosinophils Absolute 0.0 0.0 - 0.5 K/uL  ? Basophils Relative 0 %  ? Basophils Absolute 0.0 0.0 - 0.1 K/uL  ? WBC Morphology MILD LEFT SHIFT (1-5% METAS, OCC MYELO, OCC BANDS)   ? RBC Morphology MORPHOLOGY UNREMARKABLE   ? Immature Granulocytes 1 %  ? Abs Immature Granulocytes 0.12 (H) 0.00 - 0.07 K/uL  ?  Comment: Performed at [facility], [place] [address] [place], [place] [address]  ?Blood culture (routine x 2)     Status: None (Preliminary result)  ? Collection Time: 06/06/21 10:10 AM  ? Specimen: BLOOD  ?Result Value Ref Range  ? Specimen Description BLOOD RIGHT ANTECUBITAL   ? Special Requests    ?  BOTTLES DRAWN AEROBIC AND ANAEROBIC Blood Culture adequate volume  ? Culture  Setup Time    ?  GRAM POSITIVE COCCI IN PAIRS AND CHAINS ?IN BOTH AEROBIC AND ANAEROBIC BOTTLES ?CRITICAL RESULT CALLED TO, READ BACK BY AND VERIFIED WITH: T RUDISILL,PHARMD'@0222'$  06/06/21 [place] ?  ? Culture    ?  NO GROWTH < 24 HOURS ?Performed at [facility], [place] [address] [place], [place] [address] ?  ? Report Status PENDING   ?Magnesium     Status: Abnormal  ? Collection Time: 06/06/21 10:10 AM  ?Result Value Ref Range  ? Magnesium 2.6 (H) 1.7 - 2.4 mg/dL  ?  Comment: Performed at [facility], [place] [address], [place], [place] [address]  ?Phosphorus     Status: None  ? Collection Time: 06/06/21 10:10 AM  ?Result Value Ref Range  ? Phosphorus 4.6 2.5 - 4.6 mg/dL  ?  Comment: Performed at [facility], [place] [address] [place], [place] [address]  ?D-dimer, quantitative     Status: Abnormal  ? Collection Time: 06/06/21 10:10 AM  ?Result Value Ref Range  ? D-Dimer, Quant 3.29 (H) 0.00 - 0.50 ug/mL-FEU  ?  Comment: (NOTE) ?At the manufacturer cut-off value of 0.5 ?g/mL FEU, this assay has a ?negative predictive value of 95-100%.This assay is intended for use ?in conjunction with a  clinical pretest probability (PTP) assessment ?model to exclude pulmonary embolism (PE)  and deep venous thrombosis ?(DVT) in outpatients suspected of PE or DVT. ?Results should be correlated with clinical presentation. ?Performed at [facility], [place] [address], [place], [place] ?[address] ?  ?Blood Culture ID Panel (Reflexed)     Status: Abnormal  ? Collection Time: 06/06/21 10:10 AM  ?Result Value Ref Range  ? Enterococcus faecalis NOT DETECTED NOT DETECTED  ? Enterococcus Faecium NOT DETECTED NOT DETECTED  ? Listeria monocytogenes NOT DETECTED NOT DETECTED  ? Staphylococcus species NOT DETECTED NOT DETECTED  ? Staphylococcus aureus (BCID) NOT DETECTED NOT DETECTED  ? Staphylococcus epidermidis NOT DETECTED NOT DETECTED  ? Staphylococcus lugdunensis NOT DETECTED NOT DETECTED  ? Streptococcus species DETECTED (A) NOT DETECTED  ?  Comment: CRITICAL RESULT CALLED TO, READ BACK BY AND VERIFIED WITH: ?T RUDISILL,PHARMD'@0222'$  06/06/21 [place] ?  ? Streptococcus agalactiae NOT DETECTED NOT DETECTED  ? Streptococcus pneumoniae DETECTED (A) NOT DETECTED  ?  Comment: CRITICAL RESULT CALLED TO, READ BACK BY AND VERIFIED WITH: ?T RUDISILL,PHARMD'@0222'$  06/06/21 [place] ?  ? Streptococcus pyogenes NOT DETECTED NOT DETECTED  ? A.calcoaceticus-baumannii NOT DETECTED NOT DETECTED  ? Bacteroides fragilis NOT DETECTED NOT DETECTED  ? Enterobacterales NOT DETECTED NOT DETECTED  ? Enterobacter cloacae complex NOT DETECTED NOT DETECTED  ? Escherichia coli NOT DETECTED NOT DETECTED  ? Klebsiella aerogenes NOT DETECTED NOT DETECTED  ? Klebsiella oxytoca NOT DETECTED NOT DETECTED  ? Klebsiella pneumoniae NOT DETECTED NOT DETECTED  ? Proteus species NOT DETECTED NOT DETECTED  ? Salmonella species NOT DETECTED NOT DETECTED  ? Serratia marcescens NOT DETECTED NOT DETECTED  ? Haemophilus influenzae NOT DETECTED NOT DETECTED  ? Neisseria meningitidis NOT DETECTED NOT DETECTED  ? Pseudomonas aeruginosa NOT DETECTED NOT DETECTED  ? Stenotrophomonas  maltophilia NOT DETECTED NOT DETECTED  ? Candida albicans NOT DETECTED NOT DETECTED  ? Candida auris NOT DETECTED NOT DETECTED  ? Candida glabrata NOT DETECTED NOT DETECTED  ? Candida krusei NOT DETECTED NO

## 2021-06-08 ENCOUNTER — Inpatient Hospital Stay (HOSPITAL_COMMUNITY): Payer: Medicaid Other

## 2021-06-08 DIAGNOSIS — R7881 Bacteremia: Secondary | ICD-10-CM

## 2021-06-08 DIAGNOSIS — A419 Sepsis, unspecified organism: Secondary | ICD-10-CM | POA: Diagnosis not present

## 2021-06-08 DIAGNOSIS — Z7189 Other specified counseling: Secondary | ICD-10-CM | POA: Diagnosis not present

## 2021-06-08 DIAGNOSIS — A403 Sepsis due to Streptococcus pneumoniae: Secondary | ICD-10-CM | POA: Diagnosis not present

## 2021-06-08 DIAGNOSIS — R531 Weakness: Secondary | ICD-10-CM

## 2021-06-08 DIAGNOSIS — R6520 Severe sepsis without septic shock: Secondary | ICD-10-CM

## 2021-06-08 DIAGNOSIS — J690 Pneumonitis due to inhalation of food and vomit: Secondary | ICD-10-CM | POA: Diagnosis not present

## 2021-06-08 DIAGNOSIS — Z515 Encounter for palliative care: Secondary | ICD-10-CM | POA: Diagnosis not present

## 2021-06-08 DIAGNOSIS — J9601 Acute respiratory failure with hypoxia: Secondary | ICD-10-CM | POA: Diagnosis not present

## 2021-06-08 DIAGNOSIS — E43 Unspecified severe protein-calorie malnutrition: Secondary | ICD-10-CM

## 2021-06-08 LAB — COMPREHENSIVE METABOLIC PANEL
ALT: 21 U/L (ref 0–44)
AST: 42 U/L — ABNORMAL HIGH (ref 15–41)
Albumin: <1.5 g/dL — ABNORMAL LOW (ref 3.5–5.0)
Alkaline Phosphatase: 50 U/L (ref 38–126)
Anion gap: 9 (ref 5–15)
BUN: 21 mg/dL — ABNORMAL HIGH (ref 6–20)
CO2: 32 mmol/L (ref 22–32)
Calcium: 9 mg/dL (ref 8.9–10.3)
Chloride: 96 mmol/L — ABNORMAL LOW (ref 98–111)
Creatinine, Ser: 0.42 mg/dL — ABNORMAL LOW (ref 0.61–1.24)
GFR, Estimated: >60 mL/min (ref >60)
Glucose, Bld: 95 mg/dL (ref 70–99)
Potassium: 3.6 mmol/L (ref 3.5–5.1)
Sodium: 137 mmol/L (ref 135–145)
Total Bilirubin: 0.4 mg/dL (ref 0.3–1.2)
Total Protein: 4.9 g/dL — ABNORMAL LOW (ref 6.5–8.1)

## 2021-06-08 LAB — MAGNESIUM
Magnesium: 1.8 mg/dL (ref 1.7–2.4)
Magnesium: 1.7 mg/dL (ref 1.7–2.4)
Magnesium: 2 mg/dL (ref 1.7–2.4)

## 2021-06-08 LAB — GLUCOSE, CAPILLARY
Glucose-Capillary: 93 mg/dL (ref 70–99)
Glucose-Capillary: 87 mg/dL (ref 70–99)
Glucose-Capillary: 97 mg/dL (ref 70–99)
Glucose-Capillary: 114 mg/dL — ABNORMAL HIGH (ref 70–99)
Glucose-Capillary: 79 mg/dL (ref 70–99)
Glucose-Capillary: 99 mg/dL (ref 70–99)

## 2021-06-08 LAB — BASIC METABOLIC PANEL
Anion gap: 5 (ref 5–15)
Anion gap: 7 (ref 5–15)
BUN: 19 mg/dL (ref 6–20)
BUN: 16 mg/dL (ref 6–20)
CO2: 37 mmol/L — ABNORMAL HIGH (ref 22–32)
CO2: 35 mmol/L — ABNORMAL HIGH (ref 22–32)
Calcium: 9.1 mg/dL (ref 8.9–10.3)
Calcium: 8.6 mg/dL — ABNORMAL LOW (ref 8.9–10.3)
Chloride: 100 mmol/L (ref 98–111)
Chloride: 97 mmol/L — ABNORMAL LOW (ref 98–111)
Creatinine, Ser: 0.36 mg/dL — ABNORMAL LOW (ref 0.61–1.24)
Creatinine, Ser: 0.34 mg/dL — ABNORMAL LOW (ref 0.61–1.24)
GFR, Estimated: >60 mL/min (ref >60)
GFR, Estimated: >60 mL/min (ref >60)
Glucose, Bld: 85 mg/dL (ref 70–99)
Glucose, Bld: 116 mg/dL — ABNORMAL HIGH (ref 70–99)
Potassium: 3.4 mmol/L — ABNORMAL LOW (ref 3.5–5.1)
Potassium: 4.2 mmol/L (ref 3.5–5.1)
Sodium: 142 mmol/L (ref 135–145)
Sodium: 139 mmol/L (ref 135–145)

## 2021-06-08 LAB — ECHOCARDIOGRAM LIMITED
Height: 71 in
Weight: 1347.45 oz

## 2021-06-08 LAB — PROCALCITONIN: Procalcitonin: 8.35 ng/mL

## 2021-06-08 LAB — LEGIONELLA PNEUMOPHILA SEROGP 1 UR AG: L. pneumophila Serogp 1 Ur Ag: NEGATIVE

## 2021-06-08 LAB — PHOSPHORUS
Phosphorus: 2 mg/dL — ABNORMAL LOW (ref 2.5–4.6)
Phosphorus: 2.5 mg/dL (ref 2.5–4.6)
Phosphorus: 4.7 mg/dL — ABNORMAL HIGH (ref 2.5–4.6)

## 2021-06-08 MED ORDER — DEXTROSE 5 % IV SOLN: 15.0000 mmol | Freq: Once | INTRAVENOUS | Status: DC

## 2021-06-08 MED ORDER — ACETAMINOPHEN 325 MG PO TABS
650.0000 mg | ORAL_TABLET | Freq: Four times a day (QID) | ORAL | Status: DC | PRN
  Administered 2021-06-09 – 2021-06-15 (×4): 650 mg
  Filled 2021-06-08 (×5): qty 2

## 2021-06-08 MED ORDER — ORAL CARE MOUTH RINSE: 15.0000 mL | Freq: Two times a day (BID) | OROMUCOSAL | Status: DC

## 2021-06-08 MED ORDER — CHLORHEXIDINE GLUCONATE CLOTH 2 % EX PADS
6.0000 | MEDICATED_PAD | Freq: Every day | CUTANEOUS | Status: DC
  Administered 2021-06-10 – 2021-08-22 (×53): 6 via TOPICAL

## 2021-06-08 MED ORDER — ACETAMINOPHEN 650 MG RE SUPP: 650.0000 mg | Freq: Four times a day (QID) | RECTAL | Status: DC | PRN

## 2021-06-08 MED ORDER — VITAL HIGH PROTEIN PO LIQD: 1000.0000 mL | ORAL | Status: DC

## 2021-06-08 MED ORDER — TRAMADOL HCL 50 MG PO TABS: 50.0000 mg | ORAL_TABLET | Freq: Two times a day (BID) | ORAL | Status: DC

## 2021-06-08 MED ORDER — HYDROXYZINE HCL 25 MG PO TABS
25.0000 mg | ORAL_TABLET | Freq: Once | ORAL | Status: AC
  Administered 2021-06-08: 25 mg
  Filled 2021-06-08: qty 1

## 2021-06-08 MED ORDER — METHADONE HCL 10 MG PO TABS
50.0000 mg | ORAL_TABLET | Freq: Every day | ORAL | Status: DC
  Administered 2021-06-08 – 2021-06-14 (×7): 50 mg
  Filled 2021-06-08: qty 10
  Filled 2021-06-08: qty 5
  Filled 2021-06-08 (×2): qty 10
  Filled 2021-06-08: qty 5
  Filled 2021-06-08: qty 10
  Filled 2021-06-08 (×2): qty 5

## 2021-06-08 MED ORDER — MELATONIN 3 MG PO TABS
3.0000 mg | ORAL_TABLET | Freq: Every evening | ORAL | Status: DC | PRN
  Administered 2021-06-08 – 2021-06-11 (×3): 3 mg via ORAL
  Filled 2021-06-08 (×3): qty 1

## 2021-06-08 MED ORDER — POTASSIUM PHOSPHATES 15 MMOLE/5ML IV SOLN
30.0000 mmol | Freq: Once | INTRAVENOUS | Status: AC
  Administered 2021-06-08: 30 mmol via INTRAVENOUS
  Filled 2021-06-08: qty 10

## 2021-06-08 MED ORDER — THIAMINE HCL 100 MG PO TABS
100.0000 mg | ORAL_TABLET | Freq: Every day | ORAL | Status: DC
Start: 2021-06-10 — End: 2021-07-06
  Administered 2021-06-10 – 2021-07-06 (×27): 100 mg
  Filled 2021-06-08 (×27): qty 1

## 2021-06-08 MED ORDER — FOLIC ACID 1 MG PO TABS
1.0000 mg | ORAL_TABLET | Freq: Every day | ORAL | Status: DC
  Administered 2021-06-08 – 2021-07-06 (×29): 1 mg
  Filled 2021-06-08 (×29): qty 1

## 2021-06-08 MED ORDER — ADULT MULTIVITAMIN LIQUID CH
15.0000 mL | Freq: Every day | ORAL | Status: DC
  Administered 2021-06-08 – 2021-06-10 (×3): 15 mL
  Filled 2021-06-08 (×4): qty 15

## 2021-06-08 MED ORDER — SENNOSIDES-DOCUSATE SODIUM 8.6-50 MG PO TABS
1.0000 | ORAL_TABLET | Freq: Every day | ORAL | Status: DC
  Administered 2021-06-08 – 2021-06-09 (×2): 1
  Filled 2021-06-08 (×2): qty 1

## 2021-06-08 MED ORDER — CHLORHEXIDINE GLUCONATE 0.12 % MT SOLN
15.0000 mL | Freq: Two times a day (BID) | OROMUCOSAL | Status: DC
  Administered 2021-06-08 – 2021-06-11 (×5): 15 mL via OROMUCOSAL
  Filled 2021-06-08 (×4): qty 15

## 2021-06-08 MED ORDER — NICOTINE 14 MG/24HR TD PT24
14.0000 mg | MEDICATED_PATCH | Freq: Every day | TRANSDERMAL | Status: DC
  Administered 2021-06-08 – 2021-08-22 (×76): 14 mg via TRANSDERMAL
  Filled 2021-06-08 (×78): qty 1

## 2021-06-08 MED ORDER — DOCUSATE SODIUM 50 MG/5ML PO LIQD: 100.0000 mg | Freq: Two times a day (BID) | ORAL | Status: DC | PRN

## 2021-06-08 MED ORDER — MAGNESIUM SULFATE 2 GM/50ML IV SOLN
2.0000 g | Freq: Once | INTRAVENOUS | Status: AC
  Administered 2021-06-08: 2 g via INTRAVENOUS
  Filled 2021-06-08: qty 50

## 2021-06-08 MED ORDER — OSMOLITE 1.5 CAL PO LIQD
1000.0000 mL | ORAL | Status: DC
  Administered 2021-06-08 – 2021-06-24 (×8): 1000 mL
  Filled 2021-06-08 (×23): qty 1000

## 2021-06-08 MED ORDER — PROSOURCE TF PO LIQD: 45.0000 mL | Freq: Two times a day (BID) | ORAL | Status: DC

## 2021-06-08 MED ORDER — OSMOLITE 1.2 CAL PO LIQD: 1000.0000 mL | ORAL | Status: DC

## 2021-06-08 NOTE — Progress Notes (Signed)
? ?NAME:  David [name], MRN:  [id number], DOB:  [birth year], LOS: 2 ?ADMISSION DATE:  06/06/2021, CONSULTATION DATE: 06/06/2021 ?REFERRING MD: Emergency department physician, CHIEF COMPLAINT: Acute respiratory distress ? 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  Presents with acute respiratory distress with 1 question by the pulmonary critical care team about intubation he stated he did not want to be intubated and was changing CODE STATUS to DO NOT RESUSCITATE.  He does have a history of polysubstance abuse notable for his last evaluation at [facility] on 04/01/2021 positive for amphetamines.  He is on Marinol, methadone on his home MAR.  He was examined at the bedside by Dr. Tacy Learn CODE STATUS has been changed to DNR should he change his mind pulmonary critical care be available as needed. ? ?Pertinent  Medical History  ? ?Past Medical History:  ?Diagnosis Date  ? 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 [facility])   ? Pneumothorax   ? Left lung spontaneous pneumothorax at age 22 yr   ? ? ? ?Significant Hospital Events: ?Including procedures, antibiotic start and stop dates in addition to other pertinent events   ?06/06/2021 changed to DNR status ?06/06/2021 changed back to full code ? ?Interim History / Subjective:  ? ?Reports being thirsty and hungry. Discussed that he has to remain NPO for the time being. Asking for nicotine patch. Breathing is improved compared to yesterday.  ? ?Objective   ?Blood pressure 97/71, pulse 77, temperature (!) 97.2 ?F (36.2 ?C), temperature source Skin, resp. rate (!) 31, height '5\' 11"'$  (1.803 m), weight 38.2 kg, SpO2 98 %. ?   ?Vent Mode: BIPAP ?FiO2 (%):  [60 %-100 %] 60 % ?Set Rate:  [15 bmp] 15 bmp ?PEEP:  [8 cmH20] 8 cmH20  ? ?Intake/Output Summary (Last 24 hours) at 06/08/2021 1026 ?Last data filed at 06/08/2021 0600 ?Gross per 24 hour  ?Intake  1519.83 ml  ?Output 1950 ml  ?Net -430.17 ml  ? ?Filed Weights  ? 06/06/21 2000 06/07/21 0653 06/08/21 0500  ?Weight: 42.3 kg 38.2 kg 38.2 kg  ? ? ?Examination: ?General: Frail, cachectic middle aged male, sitting up in bed, NAD.  ?HENT: Poor dentition, temporal wasting, dry oral mucosa. ?CV: normal rate and regular rhythm, no m/r/g.  ?Pulm: crackles on R base, stridor noted. Normal work of breathing on 8L HFNC. No accessory muscle use. ?Abdomen: soft, nondistended, nontender, +BS. ?MSK: cachectic, no peripheral edema noted. ?Neuro: AAOx3, no focal deficits noted, cooperative with exam. ? ? ?Resolved Hospital Problem list   ?Lactic acidosis ?AKI ? ?Assessment & Plan:  ? ?Acute hypoxic respiratory failure ?RLL PNA 2/2 Strep pneumo ?Chronic aspiration ?Stage IV head and neck cancer, completed radiation in 2023, now with chronic supraglottic airway edema ?Patient presenting with severe aspiration pneumonia causing AHRF. He follows [facility] with most recent visit on 5/1, found to have moderately severe pharyngeal dysphagia, supraglottic airway edema, and evidence of audible and silent aspiration. Patient has agreed for trach/vent if required.  ?-ENT following, appreciate assistance ?-appreciate palliative care assistance ?-on ceftriaxone (day 2) ?-oxygen sats >92% on 8L HFNC ?-NPO, cortrak placed for nutrition ?-if mechanical ventilation required, ENT to perform tracheostomy  ? ?Sepsis 2/2 Strep pneumoniae bacteremia ?-initial blood cultures growing strep pneumo (2/4) ?-on ceftriaxone (day 2) ?-obtaining repeat cultures from 2 different sites today ?-f/u TTE ? ?Hypoglycemia ?Cachexia ?Severe protein energy malnutrition ?High  risk for Refeeding Syndrome ?Patient with hypoglycemia, requiring D10 infusion initially. This was likely induced by chronic malnutrition. Direct laryngoscopy-assisted visualization of swallowing ability by ENT showed mod-to-severe pharyngeal dysphagia, likely resulting in impaired PO intake.  Ultimately, patient requires a PEG tube, but this will need to wait until more stable from pulmonary standpoint. Expect blood glucose improvement with nutrition through cortrak. ?-NPO ?-cortrak placed for nutrition, increase feeds as tolerated ?-stopped d10 infusion ?-appreciate dietitian assistance ?-on high dose IV thiamine (day 2/3) ?-monitor for refeeding with BID bmp, phos, mag and replete as needed ? ?Polysubstance Use Disorder ?History of heroin use, currently on methadone therapy. Has 20+ pack-year history of smoking cigarettes, currently uses. Amphetamines noted on UDS 2 days ago. ?-continue methadone per tube ?-nicotine patch ? ?Chronic Systolic and Diastolic HF ?Most recent EF 25-30%, G2DD and RWMA in March 2023. ?-holding entresto and coreg given soft Bps ?-f/u repeat TTE in the setting of gram-positive bacteremia ? ?Best Practice (right click and "Reselect all SmartList Selections" daily)  ? ?Diet/type: NPO, cortrak placed for nutrition ?DVT prophylaxis: heparin ?GI prophylaxis: PPI ?Lines: N/A ?Foley:  N/A ?Code Status:  full code ?Last date of multidisciplinary goals of care discussion [tbd] ? ? ?Critical care time: 38 min ?  ? ? ?Virl Axe, MD ?[facility] PGY-2 ?06/08/2021, 10:26 AM ? ?

## 2021-06-08 NOTE — Progress Notes (Signed)
?                                                                                                                                                     ?                                                   ?Daily Progress Note  ? ?Patient Name: David [name]       Date: 06/08/2021 ?DOB: [birth year]  Age: 46 y.o. MRN#: [id number] ?Attending Physician: Julian Hy, DO ?Primary Care Physician: Delene Ruffini, MD ?Admit Date: 06/06/2021 ? ?Reason for Consultation/Follow-up: Establishing goals of care ? ?Subjective: ?Chart review performed. Received report from RN - no acute concerns. ? ?Went to visit patient at bedside - mother/David [name] present. Patient is lying in bed awake, alert, oriented, and able to participate in conversation. He is very ill and frail appearing. No signs or non-verbal gestures of pain or discomfort noted. No respiratory distress, increased work of breathing, or secretions noted. He is on 8L HFNC. He denies pain.  ? ?Discussed coretrak that was placed - patient's goal is still for PEG placement when medically stable. He also remains open for full code/trach if needed.  ? ?Provided suction equipment - patient is able to suction himself. He has a congested, productive cough. ? ?Emotional support provided to patient and David [name]. ? ?They express appreciation for visit today. ? ?All questions and concerns addressed. Encouraged to call with questions and/or concerns. PMT card previously provided. ? ?Length of Stay: 2 ? ?Current Medications: ?Scheduled Meds:  ? chlorhexidine  15 mL Mouth Rinse BID  ? Chlorhexidine Gluconate Cloth  6 each Topical Daily  ? folic acid  1 mg Per Tube Daily  ? heparin  5,000 Units Subcutaneous Q8H  ? mouth rinse  15 mL Mouth Rinse q12n4p  ? methadone  50 mg Per Tube Daily  ? multivitamin  15 mL Per Tube Daily  ? nicotine  14 mg Transdermal Daily  ? senna-docusate  1 tablet Per Tube Daily  ? ? ?Continuous Infusions: ? cefTRIAXone (ROCEPHIN)  IV Stopped (06/08/21 3888)  ?  dextrose Stopped (06/08/21 1132)  ? feeding supplement (OSMOLITE 1.5 CAL) 1,000 mL (06/08/21 1132)  ? potassium PHOSPHATE IVPB (in mmol) 85 mL/hr at 06/08/21 1200  ? thiamine injection Stopped (06/08/21 0937)  ? ? ?PRN Meds: ?acetaminophen **OR** acetaminophen, docusate sodium, polyethylene glycol ? ?Physical Exam ?Vitals and nursing note reviewed.  ?Constitutional:   ?   General: He is not in acute distress. ?   Appearance: He is cachectic. He is ill-appearing.  ?Pulmonary:  ?   Effort: No respiratory distress.  ?   Comments: BiPAP in  use ?Skin: ?   General: Skin is warm and dry.  ?Neurological:  ?   Motor: Weakness present.  ?         ? ?Vital Signs: BP 110/76 (BP Location: Right Arm)   Pulse 65   Temp 97.6 ?F (36.4 ?C) (Oral)   Resp 17   Ht '5\' 11"'$  (1.803 m) Comment: from previous admission  Wt 38.2 kg   SpO2 94%   BMI 11.75 kg/m?  ?SpO2: SpO2: 94 % ?O2 Device: O2 Device: High Flow Nasal Cannula ?O2 Flow Rate: O2 Flow Rate (L/min): 8 L/min ? ?Intake/output summary:  ?Intake/Output Summary (Last 24 hours) at 06/08/2021 1246 ?Last data filed at 06/08/2021 1200 ?Gross per 24 hour  ?Intake 1768.19 ml  ?Output 1950 ml  ?Net -181.81 ml  ? ?LBM:   ?Baseline Weight: Weight: 42.3 kg ?Most recent weight: Weight: 38.2 kg ? ?     ?Palliative Assessment/Data: PPS 30% with tube feeds. ? ? ? ? ? ?Patient Active Problem List  ? Diagnosis Date Noted  ? Aspiration pneumonia ([place]) 06/06/2021  ? 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 [facility])   ? Heart failure with reduced ejection fraction ([place]) 04/28/2021  ? Odynophagia 04/28/2021  ? 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 CAP (community acquired pneumonia) 04/01/2021  ? Elevated troponin 04/01/2021  ? Elevated transaminase level 04/01/2021  ? Mucositis due to antineoplastic therapy 02/14/2021  ? Drug abuse and dependence ([place]) 01/15/2021  ? Weight loss, unintentional 01/15/2021  ? Protein-calorie malnutrition, severe 01/10/2021  ? Hypomagnesemia  01/09/2021  ? Hypokalemia 01/09/2021  ? Pancytopenia ([place]) 01/09/2021  ? Hypotension 01/08/2021  ? AKI (acute kidney injury) ([place]) 01/08/2021  ? Hyponatremia 01/08/2021  ? Leukopenia due to antineoplastic chemotherapy ([place]) 01/08/2021  ? SIRS (systemic inflammatory response syndrome) ([place]) 01/08/2021  ? Chemotherapy induced nausea and vomiting 01/01/2021  ? Goals of care, counseling/discussion 12/13/2020  ? Laryngeal cancer ([place]) 12/04/2020  ? Encounter for preoperative dental examination 12/04/2020  ? Teeth missing 12/04/2020  ? Caries 12/04/2020  ? Retained tooth root 12/04/2020  ? 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 Impacted third molar tooth 12/04/2020  ? Attrition, teeth excessive 12/04/2020  ? Chronic periodontitis 12/04/2020  ? Accretions on teeth 12/04/2020  ? Incipient enamel caries 12/04/2020  ? ? ?Palliative Care Assessment & Plan  ? ?Patient Profile: ?46 y.o. male  with past medical history of laryngeal cancer s/p chemo and radiation and heroin abuse presented to ED on 06/06/21 from home with complaints of shortness of breath and not being able to eat for several days. Patient was admitted on 06/06/2021 with acute hypoxic respiratory failure with sepsis secondary to aspiration pneumonia, elevated D-dimer, AKI.  Per conversations with CCM patient would not want intubation and now DNR/DNI.  Patient has adamantly declined feeding tubes in the past, but now states he would be open to receiving one. ?  ?Of note, patient is followed by outpatient Palliative Care at [facility]. ? ?Assessment: ?Principal Problem: ?  Aspiration pneumonia ([place]) ?  Acute hypoxic respiratory failure ?  Chronic aspiration ?  Stage IV head and neck cancer, completed radiation in 2023, now with chronic supraglottic airway edema ?  Sepsis ?  Severe protein energy malnutrition ?  Chronic Systolic and Diastolic HF ?  Polysubstance Use Disorder ? ?Recommendations/Plan: ?Continue full code/full scope ?Patient remains open to  tracheostomy if medically indicated  ?Goal is still for PEG placement when medically stable ?PMT will continue to follow peripherally.  If there are any imminent needs please call the service directly ? ?Goals of Care and Additional Recommendations: ?Limitations on Scope of Treatment: Full Scope Treatment ? ?Code Status: ? ?  ?Code Status Orders  ?(From admission, onward)  ?  ? ? ?  ? ?  Start     Ordered  ? 06/06/21 2307  Full code  Continuous       ? 06/06/21 2307  ? ?  ?  ? ?  ? ?Code Status History   ? ? Date Active Date Inactive Code Status Order ID Comments User Context  ? 06/06/2021 2227 06/06/2021 2307 Full Code [id number]  Farrel Gordon, DO ED  ? 06/06/2021 1343 06/06/2021 2227 DNR [id number]  Lacinda Axon, MD ED  ? 06/06/2021 1236 06/06/2021 1343 DNR [id number]  Jacky Kindle, MD ED  ? 04/01/2021 0314 04/01/2021 1802 Full Code [id number]  Shela Leff, MD ED  ? 01/08/2021 1908 01/12/2021 0003 Full Code [id number]  Lenore Cordia, MD ED  ? ?  ? ? ?Prognosis: ? Unable to determine ? ?Discharge Planning: ?To Be Determined ? ?Care plan was discussed with primary RN, patient, patient's mother ? ?Thank you for allowing the Palliative Medicine Team to assist in the care of this patient. ? ? ?Total Time 37 minutes Prolonged Time Billed  no   ? ?   ?Greater than 50%  of this time was spent counseling and coordinating care related to the above assessment and plan. ? ?Lin Landsman, NP ? ?Please contact Palliative Medicine Team phone at [phone] for questions and concerns.  ? ?*Portions of this note are a verbal dictation therefore any spelling and/or grammatical errors are due to the "[place]" system interpretation. ? ?

## 2021-06-08 NOTE — Procedures (Signed)
Cortrak  Person Inserting Tube:  Rad Gramling C, RD Tube Type:  Cortrak - 43 inches Tube Size:  10 Tube Location:  Left nare Secured by: Bridle Technique Used to Measure Tube Placement:  Marking at nare/corner of mouth Cortrak Secured At:  70 cm   Cortrak Tube Team Note:  Consult received to place a Cortrak feeding tube.   X-ray is required, abdominal x-ray has been ordered by the Cortrak team. Please confirm tube placement before using the Cortrak tube.   If the tube becomes dislodged please keep the tube and contact the Cortrak team at [email or web address] (password [email or web address]) for replacement.  If after hours and replacement cannot be delayed, place a NG tube and confirm placement with an abdominal x-ray.    Kylieann Eagles P., RD, LDN, CNSC See AMiON for contact information    

## 2021-06-08 NOTE — Progress Notes (Signed)
?  Echocardiogram ?2D Echocardiogram has been performed. ? ?David [name] ?06/08/2021, 3:46 PM ?

## 2021-06-08 NOTE — Progress Notes (Signed)
Through out the night, called Elink numerous times to evaluate patient, patient would cough and then he would state he could not breathe, Patient had no PRN's to give for anxiety or pain due to his illness.continuous on bipap through out the night.   ?

## 2021-06-08 NOTE — Progress Notes (Signed)
RT NOTE: patient taken off of bipap this AM and placed on 8L [place] high flow nasal cannula.  Patient currently tolerating well.  Will continue to monitor.  ?

## 2021-06-08 NOTE — Progress Notes (Addendum)
Nutrition Follow-up ? ?DOCUMENTATION CODES:  ? ?Severe malnutrition in context of chronic illness, Underweight ? ?INTERVENTION:  ? ?Initiate tube feeding via Cortrak tube: ?Osmolite 1.5 at 20 ml/h, increase by 10 ml every 12 hours to goal rate of 45 ml/h (1080 ml per day). ? ?Provides 1620 kcal, 68 gm protein, 823 ml free water daily. ? ?Monitor magnesium, potassium, and phosphorus BID for at least 3 days, MD to replete as needed, as pt is at risk for refeeding syndrome given severe malnutrition. ? ?NUTRITION DIAGNOSIS:  ? ?Severe Malnutrition related to chronic illness (head and neck cancer) as evidenced by severe muscle depletion, severe fat depletion, percent weight loss (23% weight loss within 3 months). ? ?Ongoing ? ?GOAL:  ? ?Patient will meet greater than or equal to 90% of their needs ? ?Progressing with TF initiation ? ?MONITOR:  ? ?Diet advancement, Labs, I & O's, TF tolerance ? ?REASON FOR ASSESSMENT:  ? ?Consult ?Assessment of nutrition requirement/status ? ?ASSESSMENT:  ? ?46 yo male admitted with respiratory failure, suspected aspiration PNA. 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 ? ?Spoke with patient and his mother at bedside. Patient states that he was able to eat anything he wanted up until 5 days PTA, when he became unable to swallow anything. Per Mom, he has been eating very poorly for several months. He would eat things like soup, but had difficulty swallowing.  ? ?Cortrak placed today. Per x-ray tip is in the descending duodenum. Patient is happy to be able to get nutrition via Cortrak. He states he is very hungry.  ? ?Discussed patient in ICU rounds and with RN today. ?No longer requiring BiPAP. Receiving 8 L oxygen via HFNC. ? ?Patient will likely need PEG tube d/t chronic dysphagia. Per IR note yesterday, patient is not appropriate for g-tube placement at this time d/t respiratory  instability. He will require SLP evaluation prior to advancing PO diet.  ? ?Palliative Care has been consulted to determine goals of care.  ? ?Labs reviewed. K 3.6 (WNL after supplementation); phos 2 (L) ?CBG: 93-87 ? ?Medications reviewed and include folic acid, methadone, liquid MVI; Senokot-S, IV mag sulfate, IV potassium phosphate, IV thiamine. ?IVF: D10 at 50 ml/h. ? ?Diet Order:   ?Diet Order   ? ?       ?  Diet NPO time specified  Diet effective now       ?  ? ?  ?  ? ?  ? ? ?EDUCATION NEEDS:  ? ?Not appropriate for education at this time ? ?Skin:  Skin Assessment: Reviewed RN Assessment ? ?Last BM:  no BM documented ? ?Height:  ? ?Ht Readings from Last 1 Encounters:  ?06/07/21 '5\' 11"'$  (1.803 m)  ? ? ?Weight:  ? ?Wt Readings from Last 1 Encounters:  ?06/08/21 38.2 kg  ? ? ?Ideal Body Weight:  78.2 kg ? ?BMI:  Body mass index is 11.75 kg/m?. ? ?Estimated Nutritional Needs:  ? ?Kcal:  1500-1700 ? ?Protein:  65-75 gm ? ?Fluid:  1.5-1.7 L ? ? ? ?Lucas Mallow RD, LDN, CNSC ?Please refer to Amion for contact information.                                                       ? ?

## 2021-06-09 ENCOUNTER — Other Ambulatory Visit: Payer: Self-pay

## 2021-06-09 ENCOUNTER — Encounter (HOSPITAL_COMMUNITY): Payer: Self-pay | Admitting: Internal Medicine

## 2021-06-09 ENCOUNTER — Encounter (HOSPITAL_COMMUNITY)
Admission: EM | Disposition: A | Discharge status: Home Health | Payer: Self-pay | Source: Home / Self Care | Attending: Internal Medicine

## 2021-06-09 ENCOUNTER — Inpatient Hospital Stay (HOSPITAL_COMMUNITY): Payer: Medicaid Other | Admitting: Anesthesiology

## 2021-06-09 DIAGNOSIS — J690 Pneumonitis due to inhalation of food and vomit: Secondary | ICD-10-CM | POA: Diagnosis not present

## 2021-06-09 DIAGNOSIS — I5022 Chronic systolic (congestive) heart failure: Secondary | ICD-10-CM

## 2021-06-09 DIAGNOSIS — I509 Heart failure, unspecified: Secondary | ICD-10-CM

## 2021-06-09 DIAGNOSIS — Z8521 Personal history of malignant neoplasm of larynx: Secondary | ICD-10-CM

## 2021-06-09 DIAGNOSIS — J9690 Respiratory failure, unspecified, unspecified whether with hypoxia or hypercapnia: Secondary | ICD-10-CM | POA: Diagnosis not present

## 2021-06-09 DIAGNOSIS — J386 Stenosis of larynx: Secondary | ICD-10-CM

## 2021-06-09 DIAGNOSIS — F419 Anxiety disorder, unspecified: Secondary | ICD-10-CM

## 2021-06-09 DIAGNOSIS — R7881 Bacteremia: Secondary | ICD-10-CM | POA: Diagnosis present

## 2021-06-09 DIAGNOSIS — B953 Streptococcus pneumoniae as the cause of diseases classified elsewhere: Secondary | ICD-10-CM

## 2021-06-09 HISTORY — PX: TRACHEOSTOMY TUBE PLACEMENT: SHX814

## 2021-06-09 HISTORY — DX: Chronic systolic (congestive) heart failure: I50.22

## 2021-06-09 HISTORY — DX: Streptococcus pneumoniae as the cause of diseases classified elsewhere: B95.3

## 2021-06-09 HISTORY — DX: Bacteremia: R78.81

## 2021-06-09 LAB — CBC WITH DIFFERENTIAL/PLATELET
Abs Immature Granulocytes: 0 10*3/uL (ref 0.00–0.07)
Basophils Absolute: 0 10*3/uL (ref 0.0–0.1)
Basophils Relative: 0 %
Eosinophils Absolute: 0 10*3/uL (ref 0.0–0.5)
Eosinophils Relative: 0 %
HCT: 33.5 % — ABNORMAL LOW (ref 39.0–52.0)
Hemoglobin: 10.8 g/dL — ABNORMAL LOW (ref 13.0–17.0)
Lymphocytes Relative: 3 %
Lymphs Abs: 0.4 10*3/uL — ABNORMAL LOW (ref 0.7–4.0)
MCH: 30 pg (ref 26.0–34.0)
MCHC: 32.2 g/dL (ref 30.0–36.0)
MCV: 93.1 fL (ref 80.0–100.0)
Monocytes Absolute: 0.1 10*3/uL (ref 0.1–1.0)
Monocytes Relative: 1 %
Neutro Abs: 13.9 10*3/uL — ABNORMAL HIGH (ref 1.7–7.7)
Neutrophils Relative %: 96 %
Platelets: 140 10*3/uL — ABNORMAL LOW (ref 150–400)
RBC: 3.6 MIL/uL — ABNORMAL LOW (ref 4.22–5.81)
RDW: 14.1 % (ref 11.5–15.5)
WBC: 14.5 10*3/uL — ABNORMAL HIGH (ref 4.0–10.5)
nRBC: 0 % (ref 0.0–0.2)
nRBC: 0 /100 WBC

## 2021-06-09 LAB — BASIC METABOLIC PANEL
Anion gap: 9 (ref 5–15)
Anion gap: 7 (ref 5–15)
BUN: 16 mg/dL (ref 6–20)
BUN: 14 mg/dL (ref 6–20)
CO2: 30 mmol/L (ref 22–32)
CO2: 31 mmol/L (ref 22–32)
Calcium: 8.3 mg/dL — ABNORMAL LOW (ref 8.9–10.3)
Calcium: 7.5 mg/dL — ABNORMAL LOW (ref 8.9–10.3)
Chloride: 98 mmol/L (ref 98–111)
Chloride: 99 mmol/L (ref 98–111)
Creatinine, Ser: 0.42 mg/dL — ABNORMAL LOW (ref 0.61–1.24)
Creatinine, Ser: 0.43 mg/dL — ABNORMAL LOW (ref 0.61–1.24)
GFR, Estimated: >60 mL/min (ref >60)
GFR, Estimated: >60 mL/min (ref >60)
Glucose, Bld: 104 mg/dL — ABNORMAL HIGH (ref 70–99)
Glucose, Bld: 116 mg/dL — ABNORMAL HIGH (ref 70–99)
Potassium: 4.4 mmol/L (ref 3.5–5.1)
Potassium: 3.6 mmol/L (ref 3.5–5.1)
Sodium: 137 mmol/L (ref 135–145)
Sodium: 137 mmol/L (ref 135–145)

## 2021-06-09 LAB — GLUCOSE, CAPILLARY
Glucose-Capillary: 131 mg/dL — ABNORMAL HIGH (ref 70–99)
Glucose-Capillary: 69 mg/dL — ABNORMAL LOW (ref 70–99)
Glucose-Capillary: 91 mg/dL (ref 70–99)
Glucose-Capillary: 111 mg/dL — ABNORMAL HIGH (ref 70–99)
Glucose-Capillary: 132 mg/dL — ABNORMAL HIGH (ref 70–99)
Glucose-Capillary: 113 mg/dL — ABNORMAL HIGH (ref 70–99)

## 2021-06-09 LAB — MAGNESIUM
Magnesium: 1.5 mg/dL — ABNORMAL LOW (ref 1.7–2.4)
Magnesium: 3.1 mg/dL — ABNORMAL HIGH (ref 1.7–2.4)
Magnesium: 2 mg/dL (ref 1.7–2.4)

## 2021-06-09 LAB — CULTURE, BLOOD (ROUTINE X 2): Special Requests: ADEQUATE

## 2021-06-09 LAB — PHOSPHORUS
Phosphorus: 2 mg/dL — ABNORMAL LOW (ref 2.5–4.6)
Phosphorus: 1.7 mg/dL — ABNORMAL LOW (ref 2.5–4.6)
Phosphorus: 3 mg/dL (ref 2.5–4.6)

## 2021-06-09 LAB — PROCALCITONIN: Procalcitonin: 4.17 ng/mL

## 2021-06-09 SURGERY — CREATION, TRACHEOSTOMY
Anesthesia: General

## 2021-06-09 MED ORDER — SODIUM CHLORIDE 0.9 % IV SOLN
250.0000 mL | INTRAVENOUS | Status: DC
Start: 2021-06-09 — End: 2021-06-30
  Administered 2021-06-09: 250 mL via INTRAVENOUS

## 2021-06-09 MED ORDER — LACTATED RINGERS IV SOLN: INTRAVENOUS | Status: DC | PRN

## 2021-06-09 MED ORDER — NOREPINEPHRINE 4 MG/250ML-% IV SOLN
2.0000 ug/min | INTRAVENOUS | Status: DC
  Administered 2021-06-09: 4 ug/min via INTRAVENOUS
  Administered 2021-06-09: 2 ug/min via INTRAVENOUS
  Filled 2021-06-09: qty 250

## 2021-06-09 MED ORDER — FENTANYL CITRATE (PF) 250 MCG/5ML IJ SOLN
INTRAMUSCULAR | Status: DC | PRN
Start: 2021-06-09 — End: 2021-06-09
  Administered 2021-06-09: 50 ug via INTRAVENOUS

## 2021-06-09 MED ORDER — FENTANYL CITRATE (PF) 250 MCG/5ML IJ SOLN
INTRAMUSCULAR | Status: AC
  Filled 2021-06-09: qty 5

## 2021-06-09 MED ORDER — CHLORHEXIDINE GLUCONATE 0.12% ORAL RINSE (MEDLINE KIT)
15.0000 mL | Freq: Two times a day (BID) | OROMUCOSAL | Status: DC
  Administered 2021-06-09 – 2021-08-22 (×97): 15 mL via OROMUCOSAL
  Filled 2021-06-09 (×2): qty 15

## 2021-06-09 MED ORDER — LORAZEPAM 2 MG/ML IJ SOLN
INTRAMUSCULAR | Status: AC
  Filled 2021-06-09: qty 1

## 2021-06-09 MED ORDER — DEXMEDETOMIDINE HCL IN NACL 400 MCG/100ML IV SOLN
0.4000 ug/kg/h | INTRAVENOUS | Status: DC
  Administered 2021-06-09: 0.4 ug/kg/h via INTRAVENOUS
  Filled 2021-06-09: qty 100

## 2021-06-09 MED ORDER — LACTATED RINGERS IV BOLUS: 1000.0000 mL | Freq: Once | INTRAVENOUS | Status: DC

## 2021-06-09 MED ORDER — PROPOFOL 10 MG/ML IV BOLUS
INTRAVENOUS | Status: AC
  Filled 2021-06-09: qty 20

## 2021-06-09 MED ORDER — PROSOURCE TF PO LIQD: 45.0000 mL | Freq: Two times a day (BID) | ORAL | Status: DC

## 2021-06-09 MED ORDER — MAGNESIUM SULFATE 4 GM/100ML IV SOLN
4.0000 g | Freq: Once | INTRAVENOUS | Status: AC
  Administered 2021-06-09: 4 g via INTRAVENOUS
  Filled 2021-06-09: qty 100

## 2021-06-09 MED ORDER — LORAZEPAM 2 MG/ML IJ SOLN
1.0000 mg | Freq: Once | INTRAMUSCULAR | Status: AC
  Administered 2021-06-09: 1 mg via INTRAVENOUS

## 2021-06-09 MED ORDER — KETAMINE HCL 50 MG/5ML IJ SOSY
PREFILLED_SYRINGE | INTRAMUSCULAR | Status: AC
  Filled 2021-06-09: qty 5

## 2021-06-09 MED ORDER — PANTOPRAZOLE 2 MG/ML SUSPENSION
40.0000 mg | Freq: Every day | ORAL | Status: DC
  Administered 2021-06-09 – 2021-07-06 (×28): 40 mg
  Filled 2021-06-09 (×28): qty 20

## 2021-06-09 MED ORDER — VITAL HIGH PROTEIN PO LIQD: 1000.0000 mL | ORAL | Status: DC

## 2021-06-09 MED ORDER — LIDOCAINE-EPINEPHRINE 1 %-1:100000 IJ SOLN
INTRAMUSCULAR | Status: AC
  Filled 2021-06-09: qty 1

## 2021-06-09 MED ORDER — NOREPINEPHRINE 4 MG/250ML-% IV SOLN
INTRAVENOUS | Status: AC
  Filled 2021-06-09: qty 250

## 2021-06-09 MED ORDER — MIDAZOLAM HCL 2 MG/2ML IJ SOLN
INTRAMUSCULAR | Status: AC
  Filled 2021-06-09: qty 2

## 2021-06-09 MED ORDER — DEXTROSE 50 % IV SOLN
25.0000 mL | Freq: Once | INTRAVENOUS | Status: AC
  Administered 2021-06-09: 25 mL via INTRAVENOUS
  Filled 2021-06-09: qty 50

## 2021-06-09 MED ORDER — SODIUM CHLORIDE 0.9 % IV SOLN: INTRAVENOUS | Status: DC | PRN

## 2021-06-09 MED ORDER — EPHEDRINE SULFATE (PRESSORS) 50 MG/ML IJ SOLN
INTRAMUSCULAR | Status: DC | PRN
  Administered 2021-06-09 (×2): 5 mg via INTRAVENOUS

## 2021-06-09 MED ORDER — 0.9 % SODIUM CHLORIDE (POUR BTL) OPTIME
TOPICAL | Status: DC | PRN
  Administered 2021-06-09: 1000 mL

## 2021-06-09 MED ORDER — ORAL CARE MOUTH RINSE
15.0000 mL | OROMUCOSAL | Status: DC
  Administered 2021-06-09 – 2021-06-30 (×130): 15 mL via OROMUCOSAL

## 2021-06-09 MED ORDER — LIDOCAINE-EPINEPHRINE 1 %-1:100000 IJ SOLN
INTRAMUSCULAR | Status: DC | PRN
  Administered 2021-06-09: 5 mL

## 2021-06-09 MED ORDER — VASOPRESSIN 20 UNITS/100 ML INFUSION FOR SHOCK
INTRAVENOUS | Status: AC
  Filled 2021-06-09: qty 100

## 2021-06-09 MED ORDER — KETAMINE HCL 10 MG/ML IJ SOLN
INTRAMUSCULAR | Status: DC | PRN
  Administered 2021-06-09: 10 mg via INTRAVENOUS

## 2021-06-09 MED ORDER — SODIUM PHOSPHATES 45 MMOLE/15ML IV SOLN
15.0000 mmol | Freq: Once | INTRAVENOUS | Status: AC
  Administered 2021-06-09: 15 mmol via INTRAVENOUS
  Filled 2021-06-09: qty 5

## 2021-06-09 MED ORDER — PHENYLEPHRINE HCL (PRESSORS) 10 MG/ML IV SOLN
INTRAVENOUS | Status: DC | PRN
  Administered 2021-06-09: 80 ug via INTRAVENOUS

## 2021-06-09 SURGICAL SUPPLY — 51 items
BAG COUNTER SPONGE SURGICOUNT (BAG) ×2 IMPLANT
BAG SPNG CNTER NS LX DISP (BAG) ×1
BLADE CLIPPER SURG (BLADE) IMPLANT
BLADE SURG 15 STRL LF DISP TIS (BLADE) IMPLANT
BLADE SURG 15 STRL SS (BLADE)
CANISTER SUCT 3000ML PPV (MISCELLANEOUS) ×2 IMPLANT
CLEANER TIP ELECTROSURG 2X2 (MISCELLANEOUS) ×2 IMPLANT
CORD BIPOLAR FORCEPS 12FT (ELECTRODE) ×1 IMPLANT
COVER SURGICAL LIGHT HANDLE (MISCELLANEOUS) ×2 IMPLANT
DECANTER SPIKE VIAL GLASS SM (MISCELLANEOUS) ×2 IMPLANT
DRAPE HALF SHEET 40X57 (DRAPES) IMPLANT
DRAPE ORTHO SPLIT 77X108 STRL (DRAPES) ×2
DRAPE SURG ORHT 6 SPLT 77X108 (DRAPES) IMPLANT
ELECT COATED BLADE 2.86 ST (ELECTRODE) ×2 IMPLANT
ELECT REM PT RETURN 9FT ADLT (ELECTROSURGICAL) ×2
ELECTRODE REM PT RTRN 9FT ADLT (ELECTROSURGICAL) ×1 IMPLANT
FORCEPS BIPOLAR SPETZLER 8 1.0 (NEUROSURGERY SUPPLIES) ×1 IMPLANT
GAUZE 4X4 16PLY ~~LOC~~+RFID DBL (SPONGE) ×2 IMPLANT
GAUZE PETROLATUM 1 X8 (GAUZE/BANDAGES/DRESSINGS) IMPLANT
GLOVE BIO SURGEON STRL SZ 6.5 (GLOVE) ×1 IMPLANT
GLOVE BIO SURGEON STRL SZ7.5 (GLOVE) ×3 IMPLANT
GOWN STRL REUS W/ TWL LRG LVL3 (GOWN DISPOSABLE) ×2 IMPLANT
GOWN STRL REUS W/TWL LRG LVL3 (GOWN DISPOSABLE) ×2
HOLDER TRACH TUBE VELCRO 19.5 (MISCELLANEOUS) ×2 IMPLANT
KIT BASIN OR (CUSTOM PROCEDURE TRAY) ×3 IMPLANT
KIT SUCTION CATH 14FR (SUCTIONS) IMPLANT
KIT TURNOVER KIT B (KITS) ×2 IMPLANT
NDL HYPO 25GX1X1/2 BEV (NEEDLE) IMPLANT
NEEDLE HYPO 25GX1X1/2 BEV (NEEDLE) ×2 IMPLANT
NS IRRIG 1000ML POUR BTL (IV SOLUTION) ×2 IMPLANT
PACK BASIC III (CUSTOM PROCEDURE TRAY) ×2
PACK SRG BSC III STRL LF ECLPS (CUSTOM PROCEDURE TRAY) IMPLANT
PAD ARMBOARD 7.5X6 YLW CONV (MISCELLANEOUS) ×4 IMPLANT
POSITIONER HEAD DONUT 9IN (MISCELLANEOUS) ×2 IMPLANT
SPONGE DRAIN TRACH 4X4 STRL 2S (GAUZE/BANDAGES/DRESSINGS) ×2 IMPLANT
SPONGE INTESTINAL PEANUT (DISPOSABLE) ×1 IMPLANT
SUT SILK 0 FSL (SUTURE) ×2 IMPLANT
SUT SILK 2 0 (SUTURE) ×2
SUT SILK 2 0 REEL (SUTURE) ×2 IMPLANT
SUT SILK 2 0 SH CR/8 (SUTURE) ×3 IMPLANT
SUT SILK 2-0 18XBRD TIE 12 (SUTURE) IMPLANT
SUT VIC AB 2-0 FS1 27 (SUTURE) IMPLANT
SYR 10ML LL (SYRINGE) ×1 IMPLANT
SYR 20ML ECCENTRIC (SYRINGE) ×2 IMPLANT
SYR BULB EAR ULCER 3OZ GRN STR (SYRINGE) ×1 IMPLANT
SYR CONTROL 10ML LL (SYRINGE) ×1 IMPLANT
TRAY ENT MC OR (CUSTOM PROCEDURE TRAY) ×1 IMPLANT
TUBE TRACH  6.0 CUFF FLEX (MISCELLANEOUS) ×1
TUBE TRACH 6.0 CUFF FLEX (MISCELLANEOUS) IMPLANT
WATER STERILE IRR 1000ML POUR (IV SOLUTION) ×2 IMPLANT
YANKAUER SUCT BULB TIP NO VENT (SUCTIONS) ×1 IMPLANT

## 2021-06-09 NOTE — Op Note (Signed)
Preop diagnosis: Respiratory failure, history of laryngeal cancer ?Postop diagnosis: same ?Procedure: Awake tracheostomy ?Surgeon: Redmond Baseman ?Assist: None ?Anesth: IV sedation and local with 1% lidocaine with 1:100,000 epinephrine ?Compl: None ?Findings: Normal anatomy.  #6 cuffed Flexible Shiley placed. ?Description:  After discussing risks, benefits, and alternatives, the patient was brought to the operative suite and placed on the operative table in the supine position.  IV sedation was maintained while the patient remained on BiPAP support.  The anterior neck was prepped and draped in sterile fashion.  The trach site was injected with local anesthetic.  The incision was made using a 15 blade scalpel.  Subcutaneous fat was removed.  The strap muscles were retracted laterally exposing the thyroid isthmus.  The isthmus was divided using electrocautery.  Retraction of the isthmus to either side allowed exposure of the anterior tracheal wall.  Soft tissues were swept from the trachea.  The space between the second and third tracheal rings was injected with local anesthetic.  A horizontal incision was made in this space with the scalpel and extended to either side with scissors.  A #6 cuffed flexible Shiley trach was placed and the inner cannula was inserted.  The anesthesia circuit was attached and the patient was successfully ventilated.  The trach flange was secured to the neck skin using 2-0 silk in four quadrants.  The patient was cleaned off and a trach dressing and trach tie were added.  The patient was then returned to anesthesia and moved to the intensive care unit in stable condition. ? ?

## 2021-06-09 NOTE — Brief Op Note (Signed)
06/06/2021 - 06/09/2021 ? ?3:14 AM ? ?PATIENT:  David [name]  46 y.o. male ? ?PRE-OPERATIVE DIAGNOSIS:  respiratory compromise ? ?POST-OPERATIVE DIAGNOSIS:  respiratory compromise ? ?PROCEDURE:  Procedure(s): ?AWAKE TRACHEOSTOMY (N/A) ? ?SURGEON:  Surgeon(s) and Role: ?   Melida Quitter, MD - Primary ? ?PHYSICIAN ASSISTANT:  ? ?ASSISTANTS: none  ? ?ANESTHESIA:   IV sedation ? ?EBL:  Minimal  ? ?BLOOD ADMINISTERED:none ? ?DRAINS: none  ? ?LOCAL MEDICATIONS USED:  LIDOCAINE  ? ?SPECIMEN:  No Specimen ? ?DISPOSITION OF SPECIMEN:  N/A ? ?COUNTS:  YES ? ?TOURNIQUET:  * No tourniquets in log * ? ?DICTATION: .Note written in EPIC ? ?PLAN OF CARE:  Return to ICU ? ?PATIENT DISPOSITION:  ICU - intubated and hemodynamically stable. ?  ?Delay start of Pharmacological VTE agent (>24hrs) due to surgical blood loss or risk of bleeding: no ? ?

## 2021-06-09 NOTE — Assessment & Plan Note (Signed)
Currently on methadone and nicotine patch. ?

## 2021-06-09 NOTE — Assessment & Plan Note (Addendum)
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 ?Now status post tracheostomy ? ?-Tracheostomy management per ENT.  Potential very difficult airway if tracheostomy dislodged. ?-Open ended trach collar trial ?-Begin Passy-Muir valve training. ?

## 2021-06-09 NOTE — Assessment & Plan Note (Signed)
Blood cultures positive for Streptococcus pneumonia in 1 set. ?Currently on low-dose norepinephrine. ? ?-7-day course ceftriaxone. ?

## 2021-06-09 NOTE — Progress Notes (Signed)
An USGPIV (ultrasound guided PIV) has been placed for short-term vasopressor infusion. A correctly placed ivWatch must be used when administering Vasopressors. Should this treatment be needed beyond 72 hours, central line access should be obtained.  It will be the responsibility of the bedside nurse to follow best practice to prevent extravasations.   ?

## 2021-06-09 NOTE — Progress Notes (Signed)
? ?NAME:  David [name], MRN:  [id number], DOB:  [birth year], LOS: 3 ?ADMISSION DATE:  06/06/2021, CONSULTATION DATE: 06/06/2021 ?REFERRING MD: Emergency department physician, CHIEF COMPLAINT: Acute respiratory distress ? 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  Presents with acute respiratory distress with 1 question by the pulmonary critical care team about intubation he stated he did not want to be intubated and was changing CODE STATUS to DO NOT RESUSCITATE.  He does have a history of polysubstance abuse notable for his last evaluation at [facility] on 04/01/2021 positive for amphetamines.  He is on Marinol, methadone on his home MAR.  He was examined at the bedside by Dr. Tacy Learn CODE STATUS has been changed to DNR should he change his mind pulmonary critical care be available as needed. ? ?Pertinent  Medical History  ? ?Past Medical History:  ?Diagnosis Date  ? 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[place])   ? 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 [facility])   ? Pneumothorax   ? Left lung spontaneous pneumothorax at age 49 yr   ? ? ? ?Significant Hospital Events: ?Including procedures, antibiotic start and stop dates in addition to other pertinent events   ?06/06/2021 changed to DNR status ?06/06/2021 changed back to full code ?5/13 tracheostomy for increasing respiratory distress. ? ?Interim History / Subjective:  ? ?Tracheostomy in place.  Patient is sedated with postprocedure.  Tolerating SBT. ? ?Objective   ?Blood pressure 91/61, pulse 78, temperature 97.9 ?F (36.6 ?C), temperature source Axillary, resp. rate (!) 22, height '5\' 11"'$  (1.803 m), weight 41.8 kg, SpO2 94 %. ?   ?Vent Mode: PSV;CPAP ?FiO2 (%):  [40 %-100 %] 40 % ?Set Rate:  [15 bmp-16 bmp] 16 bmp ?Vt Set:  [600 mL] 600 mL ?PEEP:  [5 cmH20-6 cmH20] 5 cmH20 ?Pressure Support:  [10 cmH20] 10 cmH20 ?Plateau Pressure:  [22 cmH20] 22 cmH20  ? ?Intake/Output Summary  (Last 24 hours) at 06/09/2021 1147 ?Last data filed at 06/09/2021 1000 ?Gross per 24 hour  ?Intake 1933.16 ml  ?Output 2410 ml  ?Net -476.84 ml  ? ? ?Filed Weights  ? 06/07/21 0653 06/08/21 0500 06/09/21 0500  ?Weight: 38.2 kg 38.2 kg 41.8 kg  ? ? ?Examination: ?General: Frail, cachectic middle aged male, lying in bed in no distress. ?HENT: Poor dentition, temporal wasting, dry oral mucosa.  Tracheostomy in place. ?CV: normal rate and regular rhythm, no m/r/g.  ?Pulm: Clear bilaterally. ?Abdomen: soft, nondistended, nontender, +BS. ?MSK: cachectic, no peripheral edema noted. ?Neuro: AAOx3, no focal deficits noted, cooperative with exam. ? ? ?Resolved Hospital Problem list   ?Lactic acidosis ?AKI ? ?Assessment & Plan:  ? ?Pneumococcal bacteremia ?Blood cultures positive for Streptococcus pneumonia in 1 set. ?Currently on low-dose norepinephrine. ? ?-7-day course ceftriaxone. ? ?Laryngeal cancer ([place]) ?Required urgent tracheostomy for airway obstruction. ? ?-Tolerating trach collar at this time. ?-Further cancer with treatment to be determined at a later date. ?-Prognosis is otherwise poor given severe cachexia. ?-Would benefit from palliative care consult ? 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 ?Now status post tracheostomy ? ?-Tracheostomy management per ENT.  Potential very difficult airway if tracheostomy dislodged. ? ?Goals of care, counseling/discussion ?Has changed his mind regarding CODE STATUS several times.  Realistically limited treatment options at this point. ? ?-Palliative care consultation ? ?Protein-calorie malnutrition, severe ?Severe cachexia likely related to her oral intake and malignancy. ? ?-Place NG tube  today and start tube feeds. ?-Will likely need PEG tube to ensure adequate nutrition. ? ?Community acquired pneumonia due to Pneumococcus [facility]) ?Tolerating trach collar. ? ?-Treat for 7 days. ? ?Drug abuse and dependence  ([place]) ?Currently on methadone and nicotine patch. ? ?Chronic HFrEF (heart failure with reduced ejection fraction) ([place]) ?No signs of decompensated heart failure today. ? ?-Resume home heart failure medications once blood pressure improves. ? ? ?Best Practice (right click and "Reselect all SmartList Selections" daily)  ? ?Diet/type: NPO, cortrak placed for nutrition ?DVT prophylaxis: heparin ?GI prophylaxis: PPI ?Lines: N/A ?Foley:  N/A ?Code Status:  full code ?Last date of multidisciplinary goals of care discussion [tbd] ? ?CRITICAL CARE ?Performed by: Kipp Brood ? ? ?Total critical care time: 35 minutes ? ?Critical care time was exclusive of separately billable procedures and treating other patients. ? ?Critical care was necessary to treat or prevent imminent or life-threatening deterioration. ? ?Critical care was time spent personally by me on the following activities: development of treatment plan with patient and/or surrogate as well as nursing, discussions with consultants, evaluation of patient's response to treatment, examination of patient, obtaining history from patient or surrogate, ordering and performing treatments and interventions, ordering and review of laboratory studies, ordering and review of radiographic studies, pulse oximetry, re-evaluation of patient's condition and participation in multidisciplinary rounds. ? ?Kipp Brood, MD FRCPC ?ICU Physician ?[place]  ?Pager: [phone] ?Mobile: [phone] ?After hours: [phone]. ? ?06/09/2021, 11:47 AM ? ?

## 2021-06-09 NOTE — Progress Notes (Signed)
Subjective: ?Doing well this morning on ventilator, sedated. ? ?Objective: ?Vital signs in last 24 hours: ?Temp:  [97.6 ?F (36.4 ?C)-100.3 ?F (37.9 ?C)] 100.3 ?F (37.9 ?C) (05/13 2800) ?Pulse Rate:  [53-102] 90 (05/13 0645) ?Resp:  [7-31] 16 (05/13 0645) ?BP: (65-138)/(44-121) 81/55 (05/13 0645) ?SpO2:  [79 %-100 %] 93 % (05/13 0645) ?FiO2 (%):  [50 %-100 %] 50 % (05/13 0518) ?Weight:  [41.8 kg] 41.8 kg (05/13 0500) ?Wt Readings from Last 1 Encounters:  ?06/09/21 41.8 kg  ? ? ?Intake/Output from previous day: ?05/12 0701 - 05/13 0700 ?In: 1690.1 [I.V.:540.7; NG/GT:389.3; IV Piggyback:760.1] ?Out: 2160 [Urine:2150; Blood:10] ?Intake/Output this shift: ?No intake/output data recorded. ? ?General appearance: sedated, resting comfortably ?Neck: #6 cuffed flexible Shiley in place with ventilator functioning ? ?Recent Labs  ?  06/07/21 ?3491 06/09/21 ?7915  ?WBC 11.5* 14.5*  ?HGB 11.3* 10.8*  ?HCT 34.5* 33.5*  ?PLT 154 140*  ?  ?Recent Labs  ?  06/08/21 ?1629 06/09/21 ?0569  ?NA 139 137  ?K 4.2 4.4  ?CL 97* 98  ?CO2 35* 30  ?GLUCOSE 116* 104*  ?BUN 16 16  ?CREATININE 0.34* 0.42*  ?CALCIUM 8.6* 8.3*  ? ? ?Medications: I have reviewed the patient's current medications. ? ?Assessment/Plan: ?Airway obstruction, history of laryngeal cancer s/p emergency tracheostomy ? ?Doing well post-tracheostomy.  Sutures can be removed on post-op day 5. ? ? LOS: 3 days  ? ?Melida Quitter ?06/09/2021, 7:37 AM ? ? ? ? ? ?

## 2021-06-09 NOTE — Progress Notes (Addendum)
PCCM: ? ?Called to bedside to evaluate  ?Worsening respiratory distress.  ?Noncompliant with BiPAP. ?Agitated, struggling given 1 mg of Ativan by E. Link. ? ?Now more calm.  Remains on 100%. ?We will likely need mechanical support. ?I have called ENT. ?High risk airway recommending surgical tracheostomy. ? ?We will attempt temporizing anxiety and sedation needs with Precedex. ?We will gather all difficult airway gear to have at bedside in case of emergency. ? ?Plan discussed with nursing staff and RT at bedside ? ?CC Time: 34 mins  ? ?Garner Nash, DO ?[place] ?06/09/2021 1:41 AM   ? ?I spoke with Dr. Redmond Baseman who will come evaluate the patient for tracheostomy  ? ?Garner Nash, DO ?[place] [facility] ?06/09/2021 2:02 AM   ? ?

## 2021-06-09 NOTE — Progress Notes (Signed)
Brief Nutrition Note ? ?RD consulted for enteral/tube feeding initiation and management. Noted CCM MD ordered Adult ICU Tube Feeding Protocol. ? ?Pt already being followed by unit RD and was seen yesterday, 06/08/21, after Cortrak was placed. Osmolite 1.5 tube feeds were ordered at that time. ? ?Spoke with RN via phone call who confirms Osmolite 1.5 tube feeds are infusing via Cortrak at rate of 40 ml/hr (goal rate of 45 ml/hr). RN is titrating tube feeds to goal per order. ? ?RD to d/c Vital High Protein and ProSource TF that were ordered as part of the protocol. Will discontinue consult at this time. ? ?RD will continue to follow pt during admission. ? ? ?Gustavus Bryant, MS, RD, LDN ?Inpatient Clinical Dietitian ?Please see AMiON for contact information. ? ?

## 2021-06-09 NOTE — Transfer of Care (Signed)
Immediate Anesthesia Transfer of Care Note ? ?Patient: David [name] ? ?Procedure(s) Performed: AWAKE TRACHEOSTOMY ? ?Patient Location: ICU ? ?Anesthesia Type:MAC and General ? ?Level of Consciousness: Patient remains intubated per anesthesia plan ? ?Airway & Oxygen Therapy: Patient remains intubated per anesthesia plan and Patient placed on Ventilator (see vital sign flow sheet for setting) ? ?Post-op Assessment: Report given to RN and Post -op Vital signs reviewed and stable ? ?Post vital signs: Reviewed and stable ? ?Last Vitals:  ?Vitals Value Taken Time  ?BP 101/69 06/09/21 0341  ?Temp    ?Pulse 102 06/09/21 0345  ?Resp 23 06/09/21 0345  ?SpO2 97 % 06/09/21 0345  ?Vitals shown include unvalidated device data. ? ?Last Pain:  ?Vitals:  ? 06/09/21 0000  ?TempSrc:   ?PainSc: 0-No pain  ?   ? ?  ? ?Complications: No notable events documented. ?

## 2021-06-09 NOTE — Assessment & Plan Note (Signed)
Has changed his mind regarding CODE STATUS several times.  Realistically limited treatment options at this point. ? ?-Palliative care consultation ?

## 2021-06-09 NOTE — Assessment & Plan Note (Addendum)
No signs of decompensated heart failure today.  Echocardiogram shows improved LV systolic function ? ?-Resume home heart failure medications once blood pressure improves. ?

## 2021-06-09 NOTE — Anesthesia Preprocedure Evaluation (Addendum)
Anesthesia Evaluation  ?Patient identified by MRN, date of birth, ID band ?Patient unresponsive ? ? ? ?Reviewed: ?Allergy & Precautions, Patient's Chart, lab work & pertinent test resultsPreop documentation limited or incomplete due to emergent nature of procedure. ? ?Airway ?Mallampati: Unable to assess ? ? ? ? ? ? Dental ?  ?Pulmonary ?pneumonia, Current Smoker and Patient abstained from smoking.,  ?Laryngeal cancer  ?BiPAP ? BiPAP ? ? ? ? ? ? ? ? Cardiovascular ?+CHF  ? ?Rhythm:Regular Rate:Tachycardia ? ?ECHO: ?Left ventricular ejection fraction, by estimation, is 50 to 55%. ?  ?Neuro/Psych ?PSYCHIATRIC DISORDERS Anxiety   ? GI/Hepatic ?(+)  ?  ? substance abuse ? ,   ?Endo/Other  ? ? Renal/GU ?Renal disease  ? ?  ?Musculoskeletal ? ? Abdominal ?  ?Peds ? Hematology ? ?(+) Blood dyscrasia, anemia ,   ?Anesthesia Other Findings ?respiratory compromise ? Reproductive/Obstetrics ? ?  ? ? ? ? ? ? ? ? ? ? ? ? ? ?  ?  ? ? ? ? ? ? ? ?Anesthesia Physical ?Anesthesia Plan ? ?ASA: 5 and emergent ? ?Anesthesia Plan: General  ? ?Post-op Pain Management:   ? ?Induction:  ? ?PONV Risk Score and Plan: 1 and Treatment may vary due to age or medical condition ? ?Airway Management Planned: Tracheostomy ? ?Additional Equipment:  ? ?Intra-op Plan:  ? ?Post-operative Plan: Post-operative intubation/ventilation ? ?Informed Consent:  ? ? ? ?Only emergency history available ? ?Plan Discussed with: CRNA ? ?Anesthesia Plan Comments:   ? ? ? ? ? ?Anesthesia Quick Evaluation ? ?

## 2021-06-09 NOTE — Assessment & Plan Note (Addendum)
Severe cachexia likely related to her oral intake and malignancy. ? ?-Continue nasogastric feeds ?-Will likely need PEG tube to ensure adequate nutrition. ?-Started testosterone supplementation ?-SLP evaluation for swallowing. ?

## 2021-06-09 NOTE — Progress Notes (Signed)
Patient placed on 40% ATC per MD. Patient tolerating well at this time. RN made aware. RT will monitor.  ?

## 2021-06-09 NOTE — Progress Notes (Signed)
Subjective: ?Called emergently at 0200 by CCM due to his respiratory status worsening and needing tracheostomy right away.  He is on full BiPAP support and has be desaturating into the 70s with bradycardia.  The patient is not able to give consent but I am told by CCM that he had verbally agreed to tracheostomy if it became necessary.  Thus, he is being taken to the operating room in emergency status without signing a consent form. ? ?Objective: ?Vital signs in last 24 hours: ?Temp:  [97.2 ?F (36.2 ?C)-97.8 ?F (36.6 ?C)] 97.7 ?F (36.5 ?C) (05/12 2322) ?Pulse Rate:  [53-89] 72 (05/13 0200) ?Resp:  [15-31] 21 (05/13 0200) ?BP: (88-138)/(64-121) 132/88 (05/13 0200) ?SpO2:  [89 %-100 %] 95 % (05/13 0200) ?FiO2 (%):  [60 %-80 %] 80 % (05/13 0201) ?Weight:  [38.2 kg] 38.2 kg (05/12 0500) ?Wt Readings from Last 1 Encounters:  ?06/08/21 38.2 kg  ? ? ?Intake/Output from previous day: ?05/12 0701 - 05/13 0700 ?In: 1381.9 [I.V.:292.4; NG/GT:329.3; IV Piggyback:760.1] ?Out: 1950 [Urine:1950] ?Intake/Output this shift: ?Total I/O ?In: 202.6 [I.V.:2.6; NG/GT:200] ?Out: 700 [Urine:700] ? ?General appearance: IV sedation on BiPAP, unable to interact ?Neck: thin ? ?Recent Labs  ?  06/07/21 ?1856 06/07/21 ?0918  ?WBC 11.4* 11.5*  ?HGB 11.8* 11.3*  ?HCT 36.9* 34.5*  ?PLT 174 154  ?  ?Recent Labs  ?  06/08/21 ?1050 06/08/21 ?1629  ?NA 142 139  ?K 3.4* 4.2  ?CL 100 97*  ?CO2 37* 35*  ?GLUCOSE 85 116*  ?BUN 19 16  ?CREATININE 0.36* 0.34*  ?CALCIUM 9.1 8.6*  ? ? ?Medications: I have reviewed the patient's current medications. ? ?Assessment/Plan: ?Upper airway obstruction, history of laryngeal cancer ? ?To OR for emergency "awake" tracheostomy. ? ? LOS: 3 days  ? ?Melida Quitter ?06/09/2021, 2:25 AM ? ? ? ? ? ?

## 2021-06-09 NOTE — Assessment & Plan Note (Signed)
Tolerating trach collar. ? ?-Treat for 7 days. ?

## 2021-06-09 NOTE — Assessment & Plan Note (Signed)
Required urgent tracheostomy for airway obstruction. ? ?-Tolerating trach collar at this time. ?-Further cancer with treatment to be determined at a later date. ?-Prognosis is otherwise poor given severe cachexia. ?-Would benefit from palliative care consult ?

## 2021-06-10 DIAGNOSIS — J80 Acute respiratory distress syndrome: Secondary | ICD-10-CM | POA: Diagnosis not present

## 2021-06-10 LAB — MAGNESIUM
Magnesium: 1.7 mg/dL (ref 1.7–2.4)
Magnesium: 2.4 mg/dL (ref 1.7–2.4)

## 2021-06-10 LAB — BASIC METABOLIC PANEL
Anion gap: 6 (ref 5–15)
Anion gap: 8 (ref 5–15)
BUN: 12 mg/dL (ref 6–20)
BUN: 13 mg/dL (ref 6–20)
CO2: 29 mmol/L (ref 22–32)
CO2: 28 mmol/L (ref 22–32)
Calcium: 7.7 mg/dL — ABNORMAL LOW (ref 8.9–10.3)
Calcium: 7.7 mg/dL — ABNORMAL LOW (ref 8.9–10.3)
Chloride: 99 mmol/L (ref 98–111)
Chloride: 99 mmol/L (ref 98–111)
Creatinine, Ser: 0.38 mg/dL — ABNORMAL LOW (ref 0.61–1.24)
Creatinine, Ser: 0.37 mg/dL — ABNORMAL LOW (ref 0.61–1.24)
GFR, Estimated: >60 mL/min (ref >60)
GFR, Estimated: >60 mL/min (ref >60)
Glucose, Bld: 114 mg/dL — ABNORMAL HIGH (ref 70–99)
Glucose, Bld: 110 mg/dL — ABNORMAL HIGH (ref 70–99)
Potassium: 3.9 mmol/L (ref 3.5–5.1)
Potassium: 4.2 mmol/L (ref 3.5–5.1)
Sodium: 134 mmol/L — ABNORMAL LOW (ref 135–145)
Sodium: 135 mmol/L (ref 135–145)

## 2021-06-10 LAB — PHOSPHORUS
Phosphorus: 2.9 mg/dL (ref 2.5–4.6)
Phosphorus: 2.8 mg/dL (ref 2.5–4.6)

## 2021-06-10 LAB — GLUCOSE, CAPILLARY
Glucose-Capillary: 117 mg/dL — ABNORMAL HIGH (ref 70–99)
Glucose-Capillary: 122 mg/dL — ABNORMAL HIGH (ref 70–99)
Glucose-Capillary: 114 mg/dL — ABNORMAL HIGH (ref 70–99)
Glucose-Capillary: 123 mg/dL — ABNORMAL HIGH (ref 70–99)
Glucose-Capillary: 103 mg/dL — ABNORMAL HIGH (ref 70–99)
Glucose-Capillary: 120 mg/dL — ABNORMAL HIGH (ref 70–99)
Glucose-Capillary: 103 mg/dL — ABNORMAL HIGH (ref 70–99)

## 2021-06-10 MED ORDER — TESTOSTERONE 4 MG/24HR TD PT24
1.0000 | MEDICATED_PATCH | Freq: Every day | TRANSDERMAL | Status: DC
  Administered 2021-06-10: 1 via TRANSDERMAL
  Filled 2021-06-10: qty 1

## 2021-06-10 MED ORDER — TRAMADOL HCL 50 MG PO TABS
50.0000 mg | ORAL_TABLET | Freq: Two times a day (BID) | ORAL | Status: DC | PRN
  Administered 2021-06-10: 50 mg
  Filled 2021-06-10: qty 1

## 2021-06-10 MED ORDER — MAGNESIUM SULFATE 4 GM/100ML IV SOLN
4.0000 g | Freq: Once | INTRAVENOUS | Status: AC
  Administered 2021-06-10: 4 g via INTRAVENOUS
  Filled 2021-06-10: qty 100

## 2021-06-10 MED ORDER — SENNOSIDES-DOCUSATE SODIUM 8.6-50 MG PO TABS
1.0000 | ORAL_TABLET | Freq: Two times a day (BID) | ORAL | Status: DC
  Administered 2021-06-10 – 2021-07-06 (×42): 1
  Filled 2021-06-10 (×50): qty 1

## 2021-06-10 NOTE — Progress Notes (Addendum)
eLink Physician-Brief Progress Note ?Patient Name: David [name] ?DOB: 09-Feb-1975 ?MRN: [id number] ? ? ?Date of Service ? 06/10/2021  ?HPI/Events of Note ? Notified of patient complaint of pain.  He has chronic pain and is on methadone.  He has laryngeal CA and was urgently trached today.  ? ?On camera assessment, he appears comfortable and not in distress.   ?eICU Interventions ? Can give tramadol prn via tube.   ? ? ? ?Intervention Category ?Intermediate Interventions: Pain - evaluation and management ? ?Elsie Lincoln ?06/10/2021, 8:22 PM ? ?12:05 AM ?Pt complaining of generalized aches and pains which he says is relieved by excedrin migraine.  ? ?Plan> ?Excedrin migraine ordered.   ?Marinol reordered.  ?

## 2021-06-10 NOTE — Progress Notes (Signed)
? ?NAME:  David [name], MRN:  [id number], DOB:  06/04/1975, LOS: 4 ?ADMISSION DATE:  06/06/2021, CONSULTATION DATE: 06/06/2021 ?REFERRING MD: Emergency department physician, CHIEF COMPLAINT: Acute respiratory distress ? 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  Presents with acute respiratory distress with 1 question by the pulmonary critical care team about intubation he stated he did not want to be intubated and was changing CODE STATUS to DO NOT RESUSCITATE.  He does have a history of polysubstance abuse notable for his last evaluation at [facility] on 04/01/2021 positive for amphetamines.  He is on Marinol, methadone on his home MAR.  He was examined at the bedside by Dr. Tacy Learn CODE STATUS has been changed to DNR should he change his mind pulmonary critical care be available as needed. ? ?Pertinent  Medical History  ? ?Past Medical History:  ?Diagnosis Date  ? 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 Pneumothorax   ? Left lung spontaneous pneumothorax at age 62 yr   ? ? ? ?Significant Hospital Events: ?Including procedures, antibiotic start and stop dates in addition to other pertinent events   ?06/06/2021 changed to DNR status ?06/06/2021 changed back to full code ?5/13 tracheostomy for increasing respiratory distress. ? ?Interim History / Subjective:  ? ?Tracheostomy in place.  Tolerating trach collar ? ?Objective   ?Blood pressure 101/69, pulse 78, temperature 98.4 ?F (36.9 ?C), temperature source Oral, resp. rate (!) 23, height '5\' 11"'$  (1.803 m), weight 41.8 kg, SpO2 96 %. ?   ?FiO2 (%):  [40 %] 40 %  ? ?Intake/Output Summary (Last 24 hours) at 06/10/2021 1110 ?Last data filed at 06/10/2021 1000 ?Gross per 24 hour  ?Intake 1387.09 ml  ?Output 1500 ml  ?Net -112.91 ml  ? ? ?Filed Weights  ? 06/07/21 0653 06/08/21 0500 06/09/21 0500  ?Weight: 38.2 kg 38.2 kg 41.8  kg  ? ? ?Examination: ?General: Frail, cachectic middle aged male, lying in bed in no distress. ?HENT: Poor dentition, temporal wasting, dry oral mucosa.  Tracheostomy in place. ?CV: normal rate and regular rhythm, no m/r/g.  ?Pulm: Clear bilaterally. ?Abdomen: soft, nondistended, nontender, +BS. ?MSK: cachectic, no peripheral edema noted. ?Neuro: AAOx3, no focal deficits noted, cooperative with exam. ? ? ?Resolved Hospital Problem list   ?Lactic acidosis ?AKI ? ?Assessment & Plan:  ? ?Pneumococcal bacteremia ?Blood cultures positive for Streptococcus pneumonia in 1 set. ?Currently on low-dose norepinephrine. ? ?-7-day course ceftriaxone. ? ?Laryngeal cancer ([place]) ?Required urgent tracheostomy for airway obstruction. ? ?-Tolerating trach collar at this time. ?-Further cancer with treatment to be determined at a later date. ?-Prognosis is otherwise poor given severe cachexia. ?-Would benefit from palliative care consult ? 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 ?Now status post tracheostomy ? ?-Tracheostomy management per ENT.  Potential very difficult airway if tracheostomy dislodged. ?-Open ended trach collar trial ?-Begin Passy-Muir valve training. ?-Transfer to floor tomorrow if remains on trach collar. ? ?Goals of care, counseling/discussion ?Has changed his mind regarding CODE STATUS several times.  Realistically limited treatment options at this point. ? ?-Palliative care consultation ? ?Protein-calorie malnutrition, severe ?Severe cachexia likely related to her oral intake and malignancy. ? ?-Continue nasogastric feeds ?-Will likely need PEG tube to ensure adequate nutrition. ?-Started testosterone supplementation ?-SLP evaluation for swallowing. ? ?Community acquired pneumonia due to Pneumococcus [facility]) ?Tolerating trach collar. ? ?-  Treat for 7 days. ? ?Drug abuse and dependence ([place]) ?Currently on methadone and nicotine patch. ? ?Chronic  HFrEF (heart failure with reduced ejection fraction) ([place]) ?No signs of decompensated heart failure today.  Echocardiogram shows improved LV systolic function ? ?-Resume home heart failure medications once blood pressure improves. ? ? ?Best Practice (right click and "Reselect all SmartList Selections" daily)  ? ?Diet/type: NPO, cortrak placed for nutrition ?DVT prophylaxis: heparin ?GI prophylaxis: PPI ?Lines: N/A ?Foley:  N/A ?Code Status:  full code ?Last date of multidisciplinary goals of care discussion [tbd] ? ?CRITICAL CARE ?Performed by: Kipp Brood ? ? ?Total critical care time: 35 minutes ? ?Critical care time was exclusive of separately billable procedures and treating other patients. ? ?Critical care was necessary to treat or prevent imminent or life-threatening deterioration. ? ?Critical care was time spent personally by me on the following activities: development of treatment plan with patient and/or surrogate as well as nursing, discussions with consultants, evaluation of patient's response to treatment, examination of patient, obtaining history from patient or surrogate, ordering and performing treatments and interventions, ordering and review of laboratory studies, ordering and review of radiographic studies, pulse oximetry, re-evaluation of patient's condition and participation in multidisciplinary rounds. ? ?Kipp Brood, MD FRCPC ?ICU Physician ?[place]  ?Pager: [phone] ?Mobile: [phone] ?After hours: [phone]. ? ?06/10/2021, 11:10 AM ? ?

## 2021-06-10 NOTE — Anesthesia Postprocedure Evaluation (Signed)
Anesthesia Post Note ? ?Patient: David [name] ? ?Procedure(s) Performed: AWAKE TRACHEOSTOMY ? ?  ? ?Patient location during evaluation: ICU ?Anesthesia Type: General ?Level of consciousness: sedated ?Pain management: pain level controlled ?Vital Signs Assessment: post-procedure vital signs reviewed and stable ?Respiratory status: patient connected to T-piece oxygen ?Cardiovascular status: stable ?Postop Assessment: no apparent nausea or vomiting ?Anesthetic complications: no ? ? ?No notable events documented. ? ?Last Vitals:  ?Vitals:  ? 06/10/21 0500 06/10/21 0600  ?BP: (!) 88/67 120/82  ?Pulse: 78 80  ?Resp: (!) 23 (!) 47  ?Temp:    ?SpO2: 95% 93%  ?  ?Last Pain:  ?Vitals:  ? 06/10/21 0000  ?TempSrc:   ?PainSc: Asleep  ? ? ?  ?  ?  ?  ?  ?  ? ?Tanee Henery P Audrena Talaga ? ? ? ? ?

## 2021-06-10 NOTE — Evaluation (Signed)
Passy-Muir Speaking Valve - Evaluation ?Patient Details  ?Name: David [name] ?MRN: [id number] ?Date of Birth: 02/18/1975 ? ?Today's Date: 06/10/2021 ?Time: 2993-7169 ?SLP Time Calculation (min) (ACUTE ONLY): 25 min ? ?Past Medical History:  ?Past Medical History:  ?Diagnosis Date  ? 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 [facility])   ? Pneumothorax   ? Left lung spontaneous pneumothorax at age 17 yr   ? ?Past Surgical History:  ?Past Surgical History:  ?Procedure Laterality Date  ? chest tubes Left   ? TRACHEOSTOMY TUBE PLACEMENT N/A 06/09/2021  ? Procedure: AWAKE TRACHEOSTOMY;  Surgeon: Melida Quitter, MD;  Location: [facility] OR;  Service: ENT;  Laterality: N/A;  ? ?HPI:  ?46 y.o. male  with past medical history of laryngeal cancer s/p chemo and radiation and heroin abuse presented to ED on 06/06/21 from home with complaints of shortness of breath and not being able to eat for several days. Patient was admitted on 06/06/2021 with acute hypoxic respiratory failure with sepsis secondary to aspiration pneumonia, elevated D-dimer, AKI. Developed worsening respiratory distress requiring emergent tracheostomy on 5/13.  ? ? ?Assessment / Plan / Recommendation ? ?Clinical Impression ? Patient seen for initial PMV evaluation. Cuff deflated at baseline and patient appearing to tolerate well. RN in room upon arrival suctioning patient. SEcretions moderate and thick in nature. Patient able to expectorate via both trach and oral cavity. No evidence of leak speech noted or ability to produce audible phonation with finger occlusion. Valve placed and patient able to produce low intensity, breathy phonation, 25% intelligible at the word level. Vital signs remained WFL including RR, O2, and HR however patient begins to develop increase WOB and burst of air noted consistently from trach hub with removal indicative of air trapping/CO2 retention. Attempted to determine baseline phonatory status however  patient is a relatively poor historian. OP SLP notes from 03/2021 note stridor and hoarse vocal quality. AT this time, recommend PMV use with SLP only. ?SLP Visit Diagnosis: Aphonia (R49.1)  ?  ?SLP Assessment ? Patient needs continued Speech Lanaguage Pathology Services  ?  ?Recommendations for follow up therapy are one component of a multi-disciplinary discharge planning process, led by the attending physician.  Recommendations may be updated based on patient status, additional functional criteria and insurance authorization. ? ?Follow Up Recommendations ? Outpatient SLP ? ?  ?   ?   ?Frequency and Duration min 2x/week  ?2 weeks ?  ? ?PMSV Trial PMSV was placed for: 1-2 minute periods ?Able to redirect subglottic air through upper airway: Yes ?Able to Attain Phonation: Yes ?Voice Quality: Breathy ?Able to Expectorate Secretions: Yes ?Level of Secretion Expectoration with PMSV: Tracheal;Oral ?Breath Support for Phonation: Moderately decreased ?Intelligibility: Intelligibility reduced ?Word: 0-24% accurate ?Phrase: 0-24% accurate ?Sentence: 0-24% accurate ?Conversation: 0-24% accurate ?Respirations During Trial: (!) 33 ?SpO2 During Trial: 92 % ?Pulse During Trial: 68 ?Behavior: Lethargic ?  ?Tracheostomy Tube ? Additional Tracheostomy Tube Assessment ?Fenestrated: No ?Secretion Description: moderate thick ?Level of Secretion Expectoration: Tracheal;Oral  ?  ?Vent Dependency ? FiO2 (%): 40 %  ?  ?Cuff Deflation Trial Tolerated Cuff Deflation: Yes ?Length of Time for Cuff Deflation Trial: deflated at baseline ?Behavior:  (lethargic)  ?David Drohan MA, CCC-SLP ?   ? ? ?  ?David [name] ?06/10/2021, 2:32 PM ? ?

## 2021-06-11 ENCOUNTER — Inpatient Hospital Stay (HOSPITAL_COMMUNITY): Payer: Medicaid Other

## 2021-06-11 DIAGNOSIS — Z930 Tracheostomy status: Secondary | ICD-10-CM | POA: Diagnosis not present

## 2021-06-11 DIAGNOSIS — J386 Stenosis of larynx: Secondary | ICD-10-CM

## 2021-06-11 DIAGNOSIS — J9601 Acute respiratory failure with hypoxia: Secondary | ICD-10-CM | POA: Diagnosis present

## 2021-06-11 DIAGNOSIS — J13 Pneumonia due to Streptococcus pneumoniae: Secondary | ICD-10-CM | POA: Diagnosis not present

## 2021-06-11 DIAGNOSIS — J690 Pneumonitis due to inhalation of food and vomit: Secondary | ICD-10-CM | POA: Diagnosis not present

## 2021-06-11 LAB — CBC
HCT: 28.5 % — ABNORMAL LOW (ref 39.0–52.0)
Hemoglobin: 9.5 g/dL — ABNORMAL LOW (ref 13.0–17.0)
MCH: 30 pg (ref 26.0–34.0)
MCHC: 33.3 g/dL (ref 30.0–36.0)
MCV: 89.9 fL (ref 80.0–100.0)
Platelets: DECREASED 10*3/uL (ref 150–400)
RBC: 3.17 MIL/uL — ABNORMAL LOW (ref 4.22–5.81)
RDW: 14.1 % (ref 11.5–15.5)
WBC: 6.3 10*3/uL (ref 4.0–10.5)
nRBC: 0 % (ref 0.0–0.2)

## 2021-06-11 LAB — CULTURE, BLOOD (ROUTINE X 2): Culture: NO GROWTH

## 2021-06-11 LAB — GLUCOSE, CAPILLARY
Glucose-Capillary: 113 mg/dL — ABNORMAL HIGH (ref 70–99)
Glucose-Capillary: 119 mg/dL — ABNORMAL HIGH (ref 70–99)
Glucose-Capillary: 122 mg/dL — ABNORMAL HIGH (ref 70–99)
Glucose-Capillary: 128 mg/dL — ABNORMAL HIGH (ref 70–99)
Glucose-Capillary: 97 mg/dL (ref 70–99)
Glucose-Capillary: 99 mg/dL (ref 70–99)

## 2021-06-11 MED ORDER — HYDROCODONE-ACETAMINOPHEN 10-325 MG PO TABS: 1.0000 | ORAL_TABLET | Freq: Four times a day (QID) | ORAL | Status: DC | PRN

## 2021-06-11 MED ORDER — ADULT MULTIVITAMIN W/MINERALS CH
1.0000 | ORAL_TABLET | Freq: Every day | ORAL | Status: DC
  Administered 2021-06-11 – 2021-07-06 (×26): 1
  Filled 2021-06-11 (×26): qty 1

## 2021-06-11 MED ORDER — ASPIRIN-ACETAMINOPHEN-CAFFEINE 250-250-65 MG PO TABS
1.0000 | ORAL_TABLET | Freq: Four times a day (QID) | ORAL | Status: DC | PRN
  Administered 2021-06-11 – 2021-06-16 (×3): 1
  Filled 2021-06-11 (×6): qty 1

## 2021-06-11 MED ORDER — DRONABINOL 2.5 MG PO CAPS
2.5000 mg | ORAL_CAPSULE | Freq: Two times a day (BID) | ORAL | Status: DC
  Administered 2021-06-11: 2.5 mg via ORAL
  Filled 2021-06-11: qty 1

## 2021-06-11 MED ORDER — POLYETHYLENE GLYCOL 3350 17 G PO PACK
17.0000 g | PACK | Freq: Every day | ORAL | Status: DC
  Administered 2021-06-11 – 2021-08-21 (×46): 17 g
  Filled 2021-06-11 (×66): qty 1

## 2021-06-11 MED ORDER — HYDROCODONE-ACETAMINOPHEN 10-325 MG PO TABS
1.0000 | ORAL_TABLET | Freq: Four times a day (QID) | ORAL | Status: DC | PRN
Start: 2021-06-11 — End: 2021-06-12

## 2021-06-11 MED ORDER — ALPRAZOLAM 0.5 MG PO TABS
0.5000 mg | ORAL_TABLET | Freq: Three times a day (TID) | ORAL | Status: DC | PRN
  Administered 2021-06-11 – 2021-06-12 (×2): 0.5 mg via ORAL
  Filled 2021-06-11 (×2): qty 1

## 2021-06-11 MED ORDER — ASPIRIN-ACETAMINOPHEN-CAFFEINE 250-250-65 MG PO TABS
1.0000 | ORAL_TABLET | Freq: Four times a day (QID) | ORAL | Status: DC | PRN
  Filled 2021-06-11: qty 1

## 2021-06-11 MED ORDER — SODIUM CHLORIDE 0.9 % IV BOLUS
1000.0000 mL | Freq: Once | INTRAVENOUS | Status: AC
  Administered 2021-06-11: 1000 mL via INTRAVENOUS

## 2021-06-11 MED ORDER — BISACODYL 10 MG RE SUPP
10.0000 mg | Freq: Once | RECTAL | Status: DC
  Filled 2021-06-11: qty 1

## 2021-06-11 NOTE — Progress Notes (Signed)
Speech Language Pathology Treatment: David [name] Speaking valve  ?Patient Details ?Name: David [name] ?MRN: [id number] ?DOB: 12-Dec-1975 ?Today's Date: 06/11/2021 ?Time: 1173-5670 ?SLP Time Calculation (min) (ACUTE ONLY): 40 min ? ?Assessment / Plan / Recommendation ?Clinical Impression ? Pt was seen at bedside for follow up after PMSV evaluation was completed yesterday. Pt was awake and alert, cooperative with therapist.  Pt was tolerating cuff deflaton upon arrival of SLP. Pt exhibited stable vital signs, including O2 sats, HR and RR. Nursing was present during this session. ? ?Pt was able to produce voicing with PMSV in place. Voicing was harsh and low in intensity. No evidence of air stacking noted upon removal of PMSV. Tether was placed to minimize risk of loss. Valve was placed in purple cup. Pt was educated regarding the times removal of valve is necessary: When pt is sleeping and during breathing treatments. Signs posted at [facility].  ?  ?HPI HPI: 46 y.o. male  with past medical history of laryngeal cancer s/p chemo and radiation and heroin abuse presented to ED on 06/06/21 from home with complaints of shortness of breath and not being able to eat for several days. Patient was admitted on 06/06/2021 with acute hypoxic respiratory failure with sepsis secondary to aspiration pneumonia, elevated D-dimer, AKI. Developed worsening respiratory distress requiring emergent tracheostomy on 5/13. ?  ?   ?SLP Plan ? Continue with current plan of care ?  ?  ?Recommendations for follow up therapy are one component of a multi-disciplinary discharge planning process, led by the attending physician.  Recommendations may be updated based on patient status, additional functional criteria and insurance authorization. ?  ? ?Recommendations  ? Pt will likely benefit from continue SLP intervention at next venue of care  ?   ? Patient may use Passy-Muir Speech Valve: During all therapies with supervision;During all waking hours  (remove during sleep) ?PMSV Supervision: Full ?MD: Please consider changing trach tube to : Cuffless;Smaller size  ?   ? ? ? ? Oral Care Recommendations: Oral care QID ?Follow Up Recommendations: Outpatient SLP ?Assistance recommended at discharge: Frequent or constant Supervision/Assistance ?SLP Visit Diagnosis: Aphonia (R49.1) ?Plan: Continue with current plan of care;MBS ? ? ? ? ?  ?  ? ?David [name] B. David [name], MSP, CCC-SLP ?Speech Language Pathologist ?Office: [phone] ? ?David [name] ? ?06/11/2021, 9:50 AM ?

## 2021-06-11 NOTE — Progress Notes (Signed)
Pt requesting methadone, again informed patient that he has it ordered once a day.  Patient request pain medication states/ mouths to please, please, please give him methadone and places his hands together as though he is praying.  Attempted to explain that I cant just give medications the MD has to order them.  Patient states he needs something.  Asked what did he take before, Excedrin/Excedrin migraine meds in the past have worked.  [facility] called for MD. ?

## 2021-06-11 NOTE — Evaluation (Signed)
Physical Therapy Evaluation ?Patient Details ?Name: David [name] ?MRN: [id number] ?DOB: March 30, 1975 ?Today's Date: 06/11/2021 ? ?History of Present Illness ? Pt adm 5/10 with acute hypoxic respiratory failure due to aspiration PNA and laryngeal obstruction due to stage IV head and neck CA. Pt had trach placed on 5/13. PMH - CHF, malnutrition, Heroin use now on methadone  ?Clinical Impression ? Pt presents to PT with dec mobility in line with his overall medical decline and failure to thrive. Expect pt may progress some but overall will be limited by poor medical status. Could benefit from rollator and HHPT for transition home if pt continues with aggressive care.    ?   ? ?Recommendations for follow up therapy are one component of a multi-disciplinary discharge planning process, led by the attending physician.  Recommendations may be updated based on patient status, additional functional criteria and insurance authorization. ? ?Follow Up Recommendations Home health PT ? ?  ?Assistance Recommended at Discharge Frequent or constant Supervision/Assistance  ?Patient can return home with the following ? A little help with walking and/or transfers;A little help with bathing/dressing/bathroom;Assist for transportation;Assistance with cooking/housework;Help with stairs or ramp for entrance ? ?  ?Equipment Recommendations Rollator (4 wheels)  ?Recommendations for Other Services ?    ?  ?Functional Status Assessment Patient has had a recent decline in their functional status and/or demonstrates limited ability to make significant improvements in function in a reasonable and predictable amount of time  ? ?  ?Precautions / Restrictions Precautions ?Precautions: Fall  ? ?  ? ?Mobility ? Bed Mobility ?Overal bed mobility: Needs Assistance ?Bed Mobility: Supine to Sit, Sit to Supine ?  ?  ?Supine to sit: Min guard ?Sit to supine: Min guard ?  ?General bed mobility comments: Assist for safety and lines ?  ? ?Transfers ?Overall  transfer level: Needs assistance ?Equipment used: Rollator (4 wheels) ?Transfers: Sit to/from Stand ?Sit to Stand: Min guard ?  ?  ?  ?  ?  ?General transfer comment: Assist for safety and lines ?  ? ?Ambulation/Gait ?Ambulation/Gait assistance: Min guard ?Gait Distance (Feet): 90 Feet ?Assistive device: Rollator (4 wheels) ?Gait Pattern/deviations: Step-through pattern, Decreased stride length, Trunk flexed, Narrow base of support ?Gait velocity: decr ?Gait velocity interpretation: 1.31 - 2.62 ft/sec, indicative of limited community ambulator ?  ?General Gait Details: Assist for safety and lines. Pt fatigues quickly. ? ?Stairs ?  ?  ?  ?  ?  ? ?Wheelchair Mobility ?  ? ?Modified Rankin (Stroke Patients Only) ?  ? ?  ? ?Balance Overall balance assessment: Mild deficits observed, not formally tested ?  ?  ?  ?  ?  ?  ?  ?  ?  ?  ?  ?  ?  ?  ?  ?  ?  ?  ?   ? ? ? ?Pertinent Vitals/Pain Pain Assessment ?Pain Assessment: Faces ?Faces Pain Scale: Hurts a little bit ?Pain Location: throat ?Pain Descriptors / Indicators: Grimacing ?Pain Intervention(s): Limited activity within patient's tolerance  ? ? ?Home Living Family/patient expects to be discharged to:: Private residence ?Living Arrangements: Parent ?Available Help at Discharge: Family;Available 24 hours/day ?Type of Home: House ?Home Access: Stairs to enter ?  ?Entrance Stairs-Number of Steps: 1 ?  ?Home Layout: Two level;Able to live on main level with bedroom/bathroom ?Home Equipment: None ?   ?  ?Prior Function Prior Level of Function : Needs assist ?  ?  ?  ?  ?  ?  ?Mobility Comments:  amb without assistive device ?  ?  ? ? ?Hand Dominance  ?   ? ?  ?Extremity/Trunk Assessment  ? Upper Extremity Assessment ?Upper Extremity Assessment: Generalized weakness ?  ? ?Lower Extremity Assessment ?Lower Extremity Assessment: Generalized weakness ?  ? ?   ?Communication  ? Communication: Passy-Muir valve  ?Cognition Arousal/Alertness: Awake/alert ?Behavior During Therapy:  [facility] for tasks assessed/performed ?Overall Cognitive Status: Within Functional Limits for tasks assessed ?  ?  ?  ?  ?  ?  ?  ?  ?  ?  ?  ?  ?  ?  ?  ?  ?  ?  ?  ? ?  ?General Comments General comments (skin integrity, edema, etc.): VSS on 35% trach collar. Used PMV prior to amb but removed with amb due to questionable decr of SpO2 to high 80's with mobility. ? ?  ?Exercises    ? ?Assessment/Plan  ?  ?PT Assessment Patient needs continued PT services  ?PT Problem List Decreased strength;Decreased activity tolerance;Decreased mobility;Decreased knowledge of use of DME ? ?   ?  ?PT Treatment Interventions DME instruction;Gait training;Functional mobility training;Therapeutic activities;Therapeutic exercise;Patient/family education   ? ?PT Goals (Current goals can be found in the Care Plan section)  ?Acute Rehab PT Goals ?Patient Stated Goal: not stated ?PT Goal Formulation: With patient ?Time For Goal Achievement: 06/25/21 ?Potential to Achieve Goals: Good ? ?  ?Frequency Min 3X/week ?  ? ? ?Co-evaluation   ?  ?  ?  ?  ? ? ?  ?AM-PAC PT "6 Clicks" Mobility  ?Outcome Measure Help needed turning from your back to your side while in a flat bed without using bedrails?: None ?Help needed moving from lying on your back to sitting on the side of a flat bed without using bedrails?: A Little ?Help needed moving to and from a bed to a chair (including a wheelchair)?: A Little ?Help needed standing up from a chair using your arms (e.g., wheelchair or bedside chair)?: A Little ?Help needed to walk in hospital room?: A Little ?Help needed climbing 3-5 steps with a railing? : A Little ?6 Click Score: 19 ? ?  ?End of Session Equipment Utilized During Treatment: Oxygen ?Activity Tolerance: Patient limited by fatigue ?Patient left: in bed;with call bell/phone within reach;with family/visitor present;with nursing/sitter in room ?Nurse Communication: Other (comment) (request for suction) ?PT Visit Diagnosis: Unsteadiness on feet  (R26.81);Other abnormalities of gait and mobility (R26.89);Muscle weakness (generalized) (M62.81);Adult, failure to thrive (R62.7) ?  ? ?Time: 1025-8527 ?PT Time Calculation (min) (ACUTE ONLY): 37 min ? ? ?Charges:   PT Evaluation ?$PT Eval Moderate Complexity: 1 Mod ?PT Treatments ?$Gait Training: 8-22 mins ?  ?   ? ? ?[facility] PT ?Acute Rehabilitation Services ?Office [phone] ? ? ?Shary Decamp [facility] ?06/11/2021, 2:46 PM ? ?

## 2021-06-11 NOTE — Progress Notes (Signed)
?   06/11/21 2329  ?Assess: MEWS Score  ?Temp 97.9 ?F (36.6 ?C)  ?BP (!) 70/48  ?O2 Device Tracheostomy Collar  ?Patient Activity (if Appropriate) In bed  ?O2 Flow Rate (L/min) 10 L/min  ?FiO2 (%) 40 %  ?Assess: MEWS Score  ?MEWS Temp 0  ?MEWS Systolic 2  ?MEWS Pulse 0  ?MEWS RR 1  ?MEWS LOC 0  ?MEWS Score 3  ?MEWS Score Color Yellow  ?Assess: if the MEWS score is Yellow or Red  ?Were vital signs taken at a resting state? Yes  ?Focused Assessment Change from prior assessment (see assessment flowsheet)  ?Early Detection of Sepsis Score *See Row Information* Low  ?MEWS guidelines implemented *See Row Information* No, previously yellow, continue vital signs every 4 hours  ?Treat  ?MEWS Interventions Escalated (See documentation below)  ?Pain Scale 0-10  ?Pain Score 0  ?Take Vital Signs  ?Increase Vital Sign Frequency  Yellow: Q 2hr X 2 then Q 4hr X 2, if remains yellow, continue Q 4hrs  ?Escalate  ?MEWS: Escalate Yellow: discuss with charge nurse/RN and consider discussing with provider and RRT  ?Notify: Charge Nurse/RN  ?Name of Charge Nurse/RN Notified Whitney  ?Date Charge Nurse/RN Notified 06/11/21  ?Time Charge Nurse/RN Notified 2340  ?Notify: Provider  ?Provider Name/Title [facility]/Dr. James Ivanoff  ?Date Provider Notified 06/11/21  ?Time Provider Notified 2330  ?Method of Notification Call  ?Notification Reason Change in status ?(hypotension)  ?Provider response See new orders ?(1L NS bolus ordered and given.)  ?Date of Provider Response 06/11/21  ?Time of Provider Response 2330  ?Notify: Rapid Response  ?Name of Rapid Response RN Notified David,RN  ?Date Rapid Response Notified 06/11/21  ?Time Rapid Response Notified 2335  ? ? ?

## 2021-06-11 NOTE — Therapy (Signed)
Modified Barium Swallow Progress Note ? ?Patient Details  ?Name: David [name] ?MRN: [id number] ?Date of Birth: 04/10/1975 ? ?Today's Date: 06/11/2021 ? ?Modified Barium Swallow completed.  Full report located under Chart Review in the Imaging Section. ? ?Brief recommendations include the following: ? ?Clinical Impression ? Pt seen in radiology for instrumental assessment of swallow function and safety, and to identity least restrictive diet. Pt had PMSV in place during this evaluation.   ? ?Oral phase of the swallow is [facility]. There was no anterior leakage or residue after the swallow on any texture.   ? ?Pharyngeal swallow was characterized by premature spillage over the tongue base. Delayed swallow reflex was noted, with trigger occurring at the pyriform sinus at times. Pt exhibited vallecular residue on nectar thick liquids, and puree. Pt exhibited multiple dry swallows to clear the vallecular residue.  Residue was noted in the vallecular space, lateral channels, and pyriform sinuses on honey thick liquids. Pt again demonstrated multiple dry swallows to clear. Chin tuck position with effortful swallow was ineffective to remove post-swallow residue.   ? ?ASPIRATION was seen on nectar thick liquids. Pt did cough in response to aspiration, however, he exhibited coughing throughout this study, unrelated to penetration or aspiration. At this time, a mechanical soft diet with honey thick liquids is recommended. Pt should have assistance with cutting solids, feeding, etc. SLP will continue to follow to assess diet tolerance, provide pharyngeal strengthening exercises, and continue education to maximize swallow safety.  ? ? ?  ?Swallow Evaluation Recommendations ? SLP Diet Recommendations: Dysphagia 3 (Mech soft) solids;Honey thick liquids ? ? Liquid Administration via: Spoon ? ? Medication Administration: Whole meds with puree ? ? Supervision: Staff to assist with self feeding;Full supervision/cueing for compensatory  strategies ? ? Compensations: Small sips/bites;Slow rate;Clear throat intermittently ? ? Postural Changes: Seated upright at 90 degrees;Remain semi-upright after after feeds/meals (Comment) ? ? Oral Care Recommendations: Oral care BID ? ?   ? ?David [name], MSP, CCC-SLP ?Speech Language Pathologist ?Office: [phone] ? ?David [name] ?06/11/2021,3:09 PM ?

## 2021-06-11 NOTE — TOC Progression Note (Signed)
Transition of Care (TOC) - Progression Note  ? ? ?Patient Details  ?Name: David [name] ?MRN: [id number] ?Date of Birth: December 20, 1975 ? ?Transition of Care (TOC) CM/SW Contact  ?Angelita Ingles, RN ?Phone Number:[phone] ? ?06/11/2021, 2:19 PM ? ?Clinical Narrative:    ?TOC consulted for questions that mother has about insurance. CM called mother [name] according to mother she received a letter that stated that patient has received 100% financial assistance. Mother has already called 6the number provided on the letter and emailed bills to the billing department. This is not a TOC issue. Patients mother is currently following up on this per instructions of letter. TOC will sign off.  ? ? ?  ?  ? ?Expected Discharge Plan and Services ?  ?  ?  ?  ?  ?                ?  ?  ?  ?  ?  ?  ?  ?  ?  ?  ? ? ?Social Determinants of Health (SDOH) Interventions ?  ? ?Readmission Risk Interventions ?   ? View : No data to display.  ?  ?  ?  ? ? ?

## 2021-06-11 NOTE — Progress Notes (Signed)
Patient transferred from 56M to 5W17. Report received from [place], [place]. Mother at bedside. Dual skin assessment performed with Junie Panning, RN. Extra trach supplies at bedside. Call bell within reach. Bed in lowest position with bed alarm on. Will continue to monitor.  ?

## 2021-06-11 NOTE — Progress Notes (Addendum)
? ?NAME:  David [name], MRN:  [id number], DOB:  02/11/1975, LOS: 5 ?ADMISSION DATE:  06/06/2021, CONSULTATION DATE: 06/06/2021 ?REFERRING MD: Emergency department physician, CHIEF COMPLAINT: Acute respiratory distress ? 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  Presents with acute respiratory distress with 1 question by the pulmonary critical care team about intubation he stated he did not want to be intubated and was changing CODE STATUS to DO NOT RESUSCITATE.  He does have a history of polysubstance abuse notable for his last evaluation at [facility] on 04/01/2021 positive for amphetamines.  He is on Marinol, methadone on his home MAR.  He was examined at the bedside by Dr. Tacy Learn CODE STATUS has been changed to DNR should he change his mind pulmonary critical care be available as needed. ? ?Pertinent  Medical History  ? ?Past Medical History:  ?Diagnosis Date  ? 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 Pneumothorax   ? Left lung spontaneous pneumothorax at age 53 yr   ? ? ? ?Significant Hospital Events: ?Including procedures, antibiotic start and stop dates in addition to other pertinent events   ?06/06/2021 changed to DNR status ?06/06/2021 changed back to full code ?5/13 tracheostomy for increasing respiratory distress. ? ?Interim History / Subjective:  ? ?Tolerating tracheostomy, weaning oxygen. Complaining of pain.  ? ?Objective   ?Blood pressure (!) 86/61, pulse 79, temperature 98.4 ?F (36.9 ?C), temperature source Oral, resp. rate 20, height '5\' 11"'$  (1.803 m), weight 41.8 kg, SpO2 100 %. ?   ?FiO2 (%):  [35 %-60 %] 35 %  ? ?Intake/Output Summary (Last 24 hours) at 06/11/2021 0849 ?Last data filed at 06/11/2021 0600 ?Gross per 24 hour  ?Intake 1260 ml  ?Output 1850 ml  ?Net -590 ml  ? ?Filed Weights  ? 06/07/21 0653 06/08/21 0500 06/09/21 0500  ?Weight: 38.2 kg  38.2 kg 41.8 kg  ? ? ?Examination: ?General: frail, cachectic middle aged male, lying in bed, NAD. ?HENT: poor dentition, temporal wasting. Tracheostomy in place. ?CV: normal rate and regular rhythm, no m/r/g. ?Pulm: CTABL, no adventitious sounds. Currently satting >95% on venturi mask ?Abdomen: soft, nondistended, nontender, hypoactive BS. ?MSK: severely cachectic, no edema noted. ?Neuro: AAOx3, no focal deficits. ? ? ?Resolved Hospital Problem list   ?Lactic acidosis ?AKI ? ?Assessment & Plan:  ? ?Pneumococcal bacteremia ?CAP 2/2 Strep pneumoniae ?Blood cultures positive for Streptococcus pneumonia in 1 set. Right sided multilobar pneumonia noted on admission. Intermittently low blood pressures, but no longer requiring pressor support and MAPs maintaining >70. Repeat cultures with no growth for the past 2 days. ? ?-Ceftriaxone day 5/7 ?-stable, transfer out of ICU today ? 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 Required urgent tracheostomy for airway obstruction. ? ?-tracheostomy management per ENT (potentially difficult airway if tracheostomy dislodged) ?-Tolerating trach collar, will continue weaning oxygen as tolerated. ?-continue Passy-Muir valve training, tolerated 30-min session with SLP today -- plan for MBS today ?-palliative care following ?-added prn norco for pain control ?-plan for transfer out of ICU today ?-Further cancer treatment to be determined at a later date. ?-Prognosis is otherwise guarded given severe cachexia. ? ?Goals of care, counseling/discussion ?Has changed his mind regarding CODE STATUS several times.  Realistically limited treatment options at this point. ? ?-palliative care following ? ?Protein-calorie malnutrition, severe ?Severe  cachexia likely related to poor oral intake and malignancy. ? ?-Continue nasogastric feeds ?-Started testosterone  supplementation ?-continue SLP therapy ?-Consider PEG tube placement prior to hospital discharge for long term nutrition ? ?Constipation ?Has not yet had a bowel movement. No significant abdominal distention noted but has remained NPO given airway status (see above).  ? ?-continue miralax and senna-S daily, colace prn ?-one time dulcolax suppository ? ?Polysubstance use disorder ([place]) ?Currently on methadone and nicotine patch. ? ?Chronic HFrEF (heart failure with reduced ejection fraction) ([place]) ?No signs of heart failure on exam.  Echocardiogram shows improved LV systolic function. ? ?-Resume home heart failure medications once blood pressure improves. ? ? ?Best Practice (right click and "Reselect all SmartList Selections" daily)  ? ?Diet/type: NPO, cortrak placed for nutrition ?DVT prophylaxis: heparin ?GI prophylaxis: PPI ?Lines: N/A ?Foley:  N/A ?Code Status:  full code ?Last date of multidisciplinary goals of care discussion [tbd] ? ? ? ?Virl Axe, MD ?[facility] PGY-2 ?06/11/2021, 9:25 AM ? ? ?

## 2021-06-12 DIAGNOSIS — J13 Pneumonia due to Streptococcus pneumoniae: Secondary | ICD-10-CM

## 2021-06-12 DIAGNOSIS — J690 Pneumonitis due to inhalation of food and vomit: Secondary | ICD-10-CM | POA: Diagnosis not present

## 2021-06-12 DIAGNOSIS — J9601 Acute respiratory failure with hypoxia: Secondary | ICD-10-CM | POA: Diagnosis not present

## 2021-06-12 DIAGNOSIS — I330 Acute and subacute infective endocarditis: Secondary | ICD-10-CM | POA: Diagnosis not present

## 2021-06-12 DIAGNOSIS — R7881 Bacteremia: Secondary | ICD-10-CM | POA: Diagnosis not present

## 2021-06-12 LAB — CBC WITH DIFFERENTIAL/PLATELET
Abs Immature Granulocytes: 0.04 10*3/uL (ref 0.00–0.07)
Basophils Absolute: 0 10*3/uL (ref 0.0–0.1)
Basophils Relative: 0 %
Eosinophils Absolute: 0.1 10*3/uL (ref 0.0–0.5)
Eosinophils Relative: 1 %
HCT: 28.8 % — ABNORMAL LOW (ref 39.0–52.0)
Hemoglobin: 9.5 g/dL — ABNORMAL LOW (ref 13.0–17.0)
Immature Granulocytes: 1 %
Lymphocytes Relative: 3 %
Lymphs Abs: 0.2 10*3/uL — ABNORMAL LOW (ref 0.7–4.0)
MCH: 29.7 pg (ref 26.0–34.0)
MCHC: 33 g/dL (ref 30.0–36.0)
MCV: 90 fL (ref 80.0–100.0)
Monocytes Absolute: 0.4 10*3/uL (ref 0.1–1.0)
Monocytes Relative: 6 %
Neutro Abs: 6.2 10*3/uL (ref 1.7–7.7)
Neutrophils Relative %: 89 %
Platelets: 85 10*3/uL — ABNORMAL LOW (ref 150–400)
RBC: 3.2 MIL/uL — ABNORMAL LOW (ref 4.22–5.81)
RDW: 13.7 % (ref 11.5–15.5)
WBC: 6.9 10*3/uL (ref 4.0–10.5)
nRBC: 0 % (ref 0.0–0.2)

## 2021-06-12 LAB — GLUCOSE, CAPILLARY
Glucose-Capillary: 113 mg/dL — ABNORMAL HIGH (ref 70–99)
Glucose-Capillary: 130 mg/dL — ABNORMAL HIGH (ref 70–99)
Glucose-Capillary: 125 mg/dL — ABNORMAL HIGH (ref 70–99)
Glucose-Capillary: 113 mg/dL — ABNORMAL HIGH (ref 70–99)

## 2021-06-12 LAB — BASIC METABOLIC PANEL
Anion gap: 4 — ABNORMAL LOW (ref 5–15)
BUN: 11 mg/dL (ref 6–20)
CO2: 28 mmol/L (ref 22–32)
Calcium: 8 mg/dL — ABNORMAL LOW (ref 8.9–10.3)
Chloride: 99 mmol/L (ref 98–111)
Creatinine, Ser: 0.41 mg/dL — ABNORMAL LOW (ref 0.61–1.24)
GFR, Estimated: >60 mL/min (ref >60)
Glucose, Bld: 125 mg/dL — ABNORMAL HIGH (ref 70–99)
Potassium: 4.2 mmol/L (ref 3.5–5.1)
Sodium: 131 mmol/L — ABNORMAL LOW (ref 135–145)

## 2021-06-12 LAB — PHOSPHORUS: Phosphorus: 2.9 mg/dL (ref 2.5–4.6)

## 2021-06-12 LAB — MAGNESIUM: Magnesium: 1.6 mg/dL — ABNORMAL LOW (ref 1.7–2.4)

## 2021-06-12 MED ORDER — GUAIFENESIN 100 MG/5ML PO LIQD
5.0000 mL | Freq: Four times a day (QID) | ORAL | Status: AC
  Administered 2021-06-12 – 2021-06-14 (×8): 5 mL
  Filled 2021-06-12 (×8): qty 5

## 2021-06-12 MED ORDER — SODIUM CHLORIDE 0.9 % IV SOLN: 250.0000 mL | INTRAVENOUS | Status: DC | PRN

## 2021-06-12 MED ORDER — SODIUM CHLORIDE 0.9% FLUSH
3.0000 mL | Freq: Two times a day (BID) | INTRAVENOUS | Status: DC
  Administered 2021-06-12 – 2021-06-26 (×26): 3 mL via INTRAVENOUS

## 2021-06-12 MED ORDER — METRONIDAZOLE 50 MG/ML ORAL SUSPENSION
500.0000 mg | Freq: Two times a day (BID) | ORAL | Status: AC
  Administered 2021-06-12 – 2021-06-17 (×10): 500 mg
  Filled 2021-06-12 (×11): qty 10

## 2021-06-12 MED ORDER — RIVAROXABAN 10 MG PO TABS
10.0000 mg | ORAL_TABLET | Freq: Every day | ORAL | Status: DC
  Administered 2021-06-12 – 2021-06-13 (×2): 10 mg via ORAL
  Filled 2021-06-12 (×2): qty 1

## 2021-06-12 MED ORDER — ALPRAZOLAM 0.5 MG PO TABS
0.5000 mg | ORAL_TABLET | Freq: Once | ORAL | Status: AC
  Administered 2021-06-12: 0.5 mg
  Filled 2021-06-12: qty 1

## 2021-06-12 MED ORDER — ALPRAZOLAM 0.5 MG PO TABS: 0.5000 mg | ORAL_TABLET | Freq: Once | ORAL | Status: DC

## 2021-06-12 MED ORDER — MIDODRINE HCL 5 MG PO TABS
5.0000 mg | ORAL_TABLET | Freq: Three times a day (TID) | ORAL | Status: DC
  Administered 2021-06-12 – 2021-06-13 (×4): 5 mg
  Filled 2021-06-12 (×4): qty 1

## 2021-06-12 MED ORDER — SODIUM CHLORIDE 0.9% FLUSH: 3.0000 mL | INTRAVENOUS | Status: DC | PRN

## 2021-06-12 NOTE — Significant Event (Addendum)
Rapid Response Event Note  ? ?Reason for Call : hypotension 70/48 ?Initial Focused Assessment:  ?-Pt is alert, communicating ?-Skin is warm, pink and dry ?-pt making adequate urine output according to nursing staff.  ?-Looks like baseline BP 85-100 SBP.  ?97.11F, HR 75 SR, 70/51 (58), RR 20 with sats 100% via ATC 40% ? ? ? ?Interventions:  ?-NS bolus x 1L. Will reassess after bolus ? ? ?Addendum: 0100 HR 84, 98/64(75), RR 26 ?According to pt's chart, his trend in BP is to run soft at night in the 80s.  ? ? ? ?MD Notified: Dr. Duwayne Heck per Warren Lacy ?Call Time: 2336 ?Arrival Time: 2340 ?End Time: 2347 ? ?Madelynn Done, RN ?

## 2021-06-12 NOTE — Consult Note (Addendum)
?   ? ?I have seen and examined the patient. I have personally reviewed the clinical findings, laboratory findings, microbiological data and imaging studies. The assessment and treatment plan was discussed with the Nurse Practitioner Janene Madeira I agree with her/his recommendations except following additions/corrections. ? ?Streptococcus pneumoniae bacteremia 2/2 PNA  ( ceftriaxone MIC 1 S non meningitis, 1 I meningitis) ( Penicillin MIC 1 R meningitis and 1 S non-meningitis)  with concerns for native MV vegetation in TTE in the setting of known advanced stage laryngeal ca s/p chemotherapy and recent radiation who was admitted for progressive hypoxia, dysphagia/FTT.  He is severely cachectic and also told using IVD up until a week/so PTA. His hospital course was complicated with emergent tracheostomy for respiratory failure on 5/13. He is definitely a high risk candidate for endocarditis with bacteremia, active IVDU and abnormality seen in TTE. If TEE can be safely performed per ENT and Cardiology, it would make a difference in the management in terms of length of antibiotics and hospital stay. If TEE high risk, will plan for possible Native MV endocarditis with 4 weeks of ceftriaxone from date of negative blood cultures on  5/12. No clinical concerns for meningitis at this time.  Continue ceftriaxone as is and metronidazole added for aspiration component. Fu HCV RNA. D/w Primary team and ID pharmacy. ? ?Rosiland Oz, MD ?Infectious Disease Physician ?[facility] ?[address] ?[place], [place] [address] ?Phone: [phone]  Fax: [phone] ? ? ? ?[place] [facility]   ? ?Date of Admission:  06/06/2021    ? ?Total days of antibiotics 7 ?Ceftriaxone 5/11 ?Metronidazole 5/16 >>  ?One dose of Unasyn + Doxy  ? ? ?Reason for Consult: strep pneumonia bacteremia, MV vegetation    ?Referring Provider: IMTS ?Primary Care Provider: Delene Ruffini, MD  ? ?Assessment: ?David [name] is a 46 y.o. male admitted with acute respiratory failure secondary to pneumonia. He has had trouble with chronic aspiration r/t laryngeal swelling following XRT to neck for squamous cell carcinoma and a few episodes of pneumonia this year.  His TTE was read to be concerning for small vegetation on the mitral valve and new thickening.  ? ?He was bacteremic with streptococcus pneumoniae at presentation, preliminarily cleared from 5/12 repeat draw - while certainly expected in the setting of pneumonia, we can see this to a lesser degree as a culprit for native valve endocarditis; however given he is a person who injects drugs he is at higher risk for invasive disease here.   ? ?Typically would need TEE to better characterize this abnormality - would recommend asking ENT to comment on candidacy to better evaluate this abnormality. He has a history of IV heroin that is current (up until within a week or so prior to current admission); unfortunately, not a safe candidate for outpatient IV antibiotics. He says that he would be willing to remain inpatient for the duration of treatment. If we can more confidently rule out endocarditis with TEE is hospital length of stay would be much shorter antibiotic course.  ? ?Planning G-tube placement at present for nutrition support. Plan will be d/c to his home with his mother's care.  ? ?His sputum smells quite putrid and seems to be pretty copious still - will add metronidazole for anaerobic coverage for 5 days.  ? ?HIV test non-reactive 01/01/21  ?Hep panel 11/8 with reactive hep c Ab - will check Hep c RNA and determine if  he needs / would benefit from treatment.  ? ?Follow ongoing goals/wishes per PMT conversations.  ? ? ?Plan: ?Continue IV ceftriaxone ?Add metronidazole 500 mg BID VT x5d ?Would defer ability to get TEE safely to ENT  ?Treat presumptively if #3 not able to be done with 4 weeks Ceftriaxone from 5/12 in  supervised/inpatient environment  ?Hepatitis C RNA  ? ? ?Principal Problem: ?  Aspiration pneumonia of right lower lobe ([place]) ?Active Problems: ?  Laryngeal cancer ([place]) ?  Goals of care, counseling/discussion ?  Protein-calorie malnutrition, severe ?  Drug abuse and dependence ([place]) ?  Community acquired pneumonia due to Pneumococcus [facility]) ?  Pneumococcal bacteremia ?  Supraglottic airway obstruction ?  Chronic HFrEF (heart failure with reduced ejection fraction) ([place]) ?  Tracheostomy status ([place]) ?  Acute respiratory failure with hypoxia ([place]) ? ? ? chlorhexidine gluconate (MEDLINE KIT)  15 mL Mouth Rinse BID  ? Chlorhexidine Gluconate Cloth  6 each Topical Daily  ? folic acid  1 mg Per Tube Daily  ? guaiFENesin  5 mL Per Tube Q6H  ? mouth rinse  15 mL Mouth Rinse 10 times per day  ? methadone  50 mg Per Tube Daily  ? midodrine  5 mg Per Tube TID WC  ? multivitamin with minerals  1 tablet Per Tube Daily  ? nicotine  14 mg Transdermal Daily  ? pantoprazole sodium  40 mg Per Tube Daily  ? polyethylene glycol  17 g Per Tube Daily  ? rivaroxaban  10 mg Oral Daily  ? senna-docusate  1 tablet Per Tube BID  ? sodium chloride flush  3 mL Intravenous Q12H  ? thiamine  100 mg Per Tube Daily  ? ? ?HPI: David [name] is a 46 y.o. male admitted on 06/06/2021 from home to evaluate poor oral intake and increased labored breathing.  ? 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 HFrEF (EF 25-30%), malnutrition, prior heroin use now on methadone and anxiety.  ? ? ?He was diagnosed with laryngeal cancer in October 2022. Really noticed worsening of his health in April 2023; he has struggled with recurrent mouth sores and worsening dysphagia/odynophagia and can now only tolerate some soft bites of food in low volume and overall poor appetite; down to 96 lb from 130 over the last few months. Had been previously offered PEG tube but had been refusing this. His mother also reports he has  been more withdrawn as well and not as willing to go to doctors appts.  ? ?His current illness started 4 days prior to admission. Work up in [place] revealed large right lower lobe pneumonia. At first when PCCM saw him, he did not want intubation / cpr scope of care and remained on high levels of oxygen via facemask and eventually bipap. He changed his mind about short term intubation to treat acute problem and revoked previous decision. He was moved to ICU for ongoing care. He has in the past been recommended tracheostomy due to supraglottic edema; ENT was consulted and plan was for plan given his abnormal anatomy and concern over successful endotracheal intubation ability. He was eventually taken emergently on 5/13 to OR for tracheostomy for worsening respiratory failure.  ?He has been working with PMT to help outline goals and acceptable care plans for his current condition.  ? ?Infectious work up revealed large Rt sided PNA (Asp vs CAP). Positive urinary strep ag and positive blood cultures in 1/2 sets. Required  pressors in the ICU briefly.  ?TTE was obtained 5/12 - Anterior mitral valve leaflet appears to be thickened, with possible small hypermobile density c/f vegetation. Prior echo on 04/01/21 read MV to have mild/moderate regurgitation but normal appearing structurally.  ? ? ?Review of Systems: ?Review of Systems  ?Constitutional:  Positive for chills, fever, malaise/fatigue and weight loss.  ?Respiratory:  Positive for cough and sputum production.   ?Cardiovascular:  Positive for chest pain.  ?Gastrointestinal:  Negative for abdominal pain, nausea and vomiting.  ?Genitourinary: Negative.   ?Musculoskeletal: Negative.   ?Skin:  Negative for itching and rash.  ? ?Past Medical History:  ?Diagnosis Date  ? 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 [facility])   ? Pneumothorax   ? Left lung spontaneous pneumothorax at age 53 yr   ? ? ?Social History  ? ?Tobacco Use  ? Smoking status: Every  Day  ?  Packs/day: 3.00  ?  Types: Cigarettes  ? Smokeless tobacco: Never  ?Vaping Use  ? Vaping Use: Never used  ?Substance Use Topics  ? Alcohol use: Not Currently  ?  Alcohol/week: 12.0 standard drinks  ?  Type

## 2021-06-12 NOTE — Progress Notes (Signed)
Pharmacy Heparin Induced Thrombocytopenia (HIT) Note: ? ?David [name] is an 46 y.o. male being evaluated for HIT. Lovenox given 06/06/21, and then heparin was started 06/07/21 for VTE prophylaxis, and baseline platelets were 154 (technically first lab was 463, however, I question the accuracy of that lab.).  ? ?HIT labs were ordered on 06/12/21 when platelets dropped to 85. ? ?Auto-populate labs: No results found for: HEPINDPLTAB, SRALOWDOSEHP, SRAHIGHDOSEH ? ? ?CALCULATE SCORE:  ?4Ts (see the HIT Algorithm) Score  ?Thrombocytopenia 1  ?Timing 2  ?Thrombosis 0  ?Other causes of thrombocytopenia 1  ?Total 4  ? ? ? ?Recommendations (A or B) are based on available lab results (HIT antibody and/or SRA) and the HIT algorithm  ? ? ?A. HIT antibody result available ? ?Possible HIT   ?Order SRA:  Pend Heparin antibody ?Discontinue heparin / LMWH:  Yes ?Initiate alternative anticoagulation:  Yes ?Document heparin allergy:  Pending lab results ? ? ?B. SRA result availability ? ?SRA not available ? ? ?Name of MD Contacted: Madalyn Rob ? ?Plan (Discussed with provider) ?Labs ordered:  heparin antibody ordered ?Heparin allergy:  Pending lab results ?Anticoagulation plans:  Begin alternative anticoagulation with Xarelto 10 mg daily ? ? ?Thank you for allowing [name] to participate in this patients care. ?Jens Som, PharmD ?06/12/2021 3:23 PM ? ?**Pharmacist phone directory can be found on [email or web address] listed under [place]** ? ? ? ?

## 2021-06-12 NOTE — Progress Notes (Signed)
SLP Cancellation Note ? ?Patient Details ?Name: David [name] ?MRN: [id number] ?DOB: Jun 11, 1975 ? ? ?Cancelled treatment:       Reason Eval/Treat Not Completed: Fatigue/lethargy limiting ability to participate. Patient sleeping and per RN and patient's mother, he has been sleeping all day, has not been awake enough to eat or drink anything. Rapid response called last night due to patient's BP dropping. Per RN, have had to suction a lot of tan colored secretions today. She also discussed with SLP concerns that previous nurse had regarding giving patient medication whole in applesauce/pudding as patient was not seeming alert enough for this. SLP will f/u next date but recommending continue with Dys 3 solids, honey thick liquids, eat only when fully awake and alert. ? ? ?Sonia Baller, MA, CCC-SLP ?Speech Therapy ?

## 2021-06-12 NOTE — Progress Notes (Signed)
Patient now fully awake and alert. Patient sitting up in bed and eating applesauce, tolerating well.  ?

## 2021-06-12 NOTE — Progress Notes (Addendum)
? ?Subjective:  ? ?Overnight rapid response called for hypotension and patient given NS bolus X 1L.  ? ?IMTS taking over as primary team this morning.  ? ?Patient asleep on approach and awakens to voice. Endorses no concerns this morning. Patient is a Full Code. We used a whiteboard to communicate for part of interview. He would like PEG tube placed.  ? ?Objective: ? ?Vital signs in last 24 hours: ?Vitals:  ? 06/12/21 0100 06/12/21 0341 06/12/21 0449 06/12/21 0500  ?BP: 98/64 (!) 80/58    ?Pulse: 84 79 68   ?Resp: (!) '26 17 18   '$ ?Temp:  98 ?F (36.7 ?C)    ?TempSrc:  Oral    ?SpO2: 100% 99% 95%   ?Weight:    42.1 kg  ?Height:      ? ? ?General: Cachectic and frail, ill appearing ?HEENT: thick secretion, Tracheostomy in place on tracheostomy collar  ?Cardiovascular: Normal rate, regular rhythm.  No murmur ?Pulmonary : CTAB ?Abdominal: soft, nontender,  bowel sounds present ? ? ?Assessment/Plan: ? ?Principal Problem: ?  Aspiration pneumonia of right lower lobe ([place]) ?Active Problems: ?  Laryngeal cancer ([place]) ?  Goals of care, counseling/discussion ?  Protein-calorie malnutrition, severe ?  Drug abuse and dependence ([place]) ?  Community acquired pneumonia due to Pneumococcus [facility]) ?  Pneumococcal bacteremia ?  Supraglottic airway obstruction ?  Chronic HFrEF (heart failure with reduced ejection fraction) ([place]) ?  Tracheostomy status ([place]) ?  Acute respiratory failure with hypoxia ([place]) ? ? ?Hospital Day #6 for Donne Baley a 46 y.o. with PMH of Stage IV SCC of supraglottis s/p radiation with complete metabolic response admitted for acute hypoxic respiratory failure secondary to laryngeal edema/obstruction now s/p tracheostomy.  ? ? ?#Acute respiratory failure with hypoxia 2/2 laryngeal edema/obstruction s/p tracheostomy by ENT on 06/09/2021 ?- On Tracheostomy Collar, 10L FiO2 40%. Continue to wean as tolerated , maintain oxygen saturation > 92%. ?- Tracheostomy care BID ?- Will add Guaifenesin to help with  secretions.  Continue regular suction. ? ?#Hypotension ?- appears patient has a chronically low blood blood pressure, SBP 100's. This could be from patient severe malnutrition. He required pressor support early in admission in setting of sepsis, but off pressor support since 5/14. Last night given fluid bolus when Rapid Response RN called to evaluate.  ?- Start midodrine, maintain MAP > 65 ? 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 One set of Bcx from 5/10 grew streptococcus pneumonia sensitive to ceftriaxone. TTE showed a thickened anterior leaflet with possible small hypermobile density concerning for vegetation. Discussed with ICU team and TEE not planned due to patients status. Blood cultures negative from 5/12 and patient is on Ceftriaxone.  ?- Continue Ceftriaxone, will need extended treatment if unable to obtain TTE to rule out endocarditis  ?- Follow blood cultures  ? ?#Protein-calorie malnutrition , Severe ?- SLP following, recommending nectar thick liquids. Full supervision. Continuing to follow.  ?- IR consulted for PEG tube, follow up consult  ?- Continue Tube Feeds. ?- BMP, Mg, Phos ? ?#Polysubstance use disorder ?- Continue Methadone ?- Continue Nicotine Patch ? ?#Thrombocytopenia ?Mild thrombocytopenia on 5/13. Will repeat CBC   ?  ?Addendum: Platelets 85 on repeat CBC . Patient with moderate risk of HIT with score of 4. ( > 50% decrease, probable timing, and sepsis as other possible cause) Sub Q heparin  stopped. HIT protocol ordered.  ? ?DVT prophx: SubQ Heparin ?Diet: Dys 3, Osmolite per NG tube ?Bowel: Senna-docusate BID ?Code: Full ? ?Dispo: Anticipated discharge in approximately 3-5 day(s).  ? ? ?Tamsen Snider, MD ?Internal Medicine PGY-3  ?Pager: [phone] ?After 5pm on weekdays and 1pm on weekends: On Call pager [phone] ? ? ? ? ?

## 2021-06-13 DIAGNOSIS — J690 Pneumonitis due to inhalation of food and vomit: Secondary | ICD-10-CM | POA: Diagnosis not present

## 2021-06-13 DIAGNOSIS — Z7189 Other specified counseling: Secondary | ICD-10-CM | POA: Diagnosis not present

## 2021-06-13 DIAGNOSIS — F17210 Nicotine dependence, cigarettes, uncomplicated: Secondary | ICD-10-CM

## 2021-06-13 DIAGNOSIS — J9601 Acute respiratory failure with hypoxia: Secondary | ICD-10-CM | POA: Diagnosis not present

## 2021-06-13 DIAGNOSIS — I330 Acute and subacute infective endocarditis: Secondary | ICD-10-CM | POA: Diagnosis not present

## 2021-06-13 LAB — CBC WITH DIFFERENTIAL/PLATELET
Abs Immature Granulocytes: 0.05 10*3/uL (ref 0.00–0.07)
Basophils Absolute: 0 10*3/uL (ref 0.0–0.1)
Basophils Relative: 0 %
Eosinophils Absolute: 0.1 10*3/uL (ref 0.0–0.5)
Eosinophils Relative: 1 %
HCT: 31.2 % — ABNORMAL LOW (ref 39.0–52.0)
Hemoglobin: 10.1 g/dL — ABNORMAL LOW (ref 13.0–17.0)
Immature Granulocytes: 1 %
Lymphocytes Relative: 3 %
Lymphs Abs: 0.3 10*3/uL — ABNORMAL LOW (ref 0.7–4.0)
MCH: 29.2 pg (ref 26.0–34.0)
MCHC: 32.4 g/dL (ref 30.0–36.0)
MCV: 90.2 fL (ref 80.0–100.0)
Monocytes Absolute: 0.6 10*3/uL (ref 0.1–1.0)
Monocytes Relative: 7 %
Neutro Abs: 8.6 10*3/uL — ABNORMAL HIGH (ref 1.7–7.7)
Neutrophils Relative %: 88 %
Platelets: 121 10*3/uL — ABNORMAL LOW (ref 150–400)
RBC: 3.46 MIL/uL — ABNORMAL LOW (ref 4.22–5.81)
RDW: 13.9 % (ref 11.5–15.5)
WBC: 9.6 10*3/uL (ref 4.0–10.5)
nRBC: 0 % (ref 0.0–0.2)

## 2021-06-13 LAB — BASIC METABOLIC PANEL
Anion gap: 9 (ref 5–15)
BUN: 11 mg/dL (ref 6–20)
CO2: 26 mmol/L (ref 22–32)
Calcium: 8 mg/dL — ABNORMAL LOW (ref 8.9–10.3)
Chloride: 96 mmol/L — ABNORMAL LOW (ref 98–111)
Creatinine, Ser: 0.46 mg/dL — ABNORMAL LOW (ref 0.61–1.24)
GFR, Estimated: >60 mL/min (ref >60)
Glucose, Bld: 75 mg/dL (ref 70–99)
Potassium: 4.7 mmol/L (ref 3.5–5.1)
Sodium: 131 mmol/L — ABNORMAL LOW (ref 135–145)

## 2021-06-13 LAB — GLUCOSE, CAPILLARY
Glucose-Capillary: 100 mg/dL — ABNORMAL HIGH (ref 70–99)
Glucose-Capillary: 108 mg/dL — ABNORMAL HIGH (ref 70–99)
Glucose-Capillary: 111 mg/dL — ABNORMAL HIGH (ref 70–99)
Glucose-Capillary: 124 mg/dL — ABNORMAL HIGH (ref 70–99)
Glucose-Capillary: 139 mg/dL — ABNORMAL HIGH (ref 70–99)
Glucose-Capillary: 190 mg/dL — ABNORMAL HIGH (ref 70–99)
Glucose-Capillary: 85 mg/dL (ref 70–99)

## 2021-06-13 LAB — CULTURE, BLOOD (ROUTINE X 2)
Culture: NO GROWTH
Culture: NO GROWTH
Special Requests: ADEQUATE
Special Requests: ADEQUATE

## 2021-06-13 LAB — HEPARIN INDUCED PLATELET AB (HIT ANTIBODY): Heparin Induced Plt Ab: 0.062 OD (ref 0.000–0.400)

## 2021-06-13 MED ORDER — HYDROXYZINE HCL 25 MG PO TABS
25.0000 mg | ORAL_TABLET | Freq: Once | ORAL | Status: AC
  Administered 2021-06-13: 25 mg
  Filled 2021-06-13: qty 1

## 2021-06-13 MED ORDER — MIDODRINE HCL 5 MG PO TABS
10.0000 mg | ORAL_TABLET | Freq: Three times a day (TID) | ORAL | Status: DC
Start: 2021-06-13 — End: 2021-07-06
  Administered 2021-06-13 – 2021-07-06 (×66): 10 mg
  Filled 2021-06-13 (×67): qty 2

## 2021-06-13 NOTE — Progress Notes (Addendum)
Subjective:  Overnight, patient had a rapid response called for hypotension with BP 70/48. He was given 1L NS at the time. Upon further review, it was noted that patient's blood pressures tend to run lower so no other interventions were performed. He also required a single dose of 0.5 mg ativan for anxiety.   This morning, he reports being frustrated that he is still in the hospital and not improving. He reports that he wants to live and wishes to have a G-tube. His biggest complaint is having ongoing secretions from his trach and reports having to suction himself frequently. He is still having trouble breathing. Reports back pain from lying in bed. Denies chest pain, abdominal pain, or nausea.   Objective:  Vital signs in last 24 hours: Vitals:   06/13/21 0931 06/13/21 1137 06/13/21 1219 06/13/21 1429  BP: (!) 73/48 (!) 89/63    Pulse: David 62  73  Resp: 16 (!) 22  17  Temp:   97.6 F (36.4 C)   TempSrc:   Axillary   SpO2: 96% 95%  96%  Weight:      Height:       Weight change:   Intake/Output Summary (Last 24 hours) at 06/13/2021 1502 Last data filed at 06/13/2021 0602 Gross per 24 hour  Intake 835 ml  Output 1352 ml  Net -517 ml   General: Cachectic and fatigued appearing. Lying in bed and in no acute distress.  Neuro: A&O x3, normal affect HEENT: Normocephalic, atraumatic, EOMI, normal sclerae without scleral icterus or injection.  Cardiovascular: Regular rate and rhythm, no murmurs. No peripheral edema.  Pulmonary: Trach collar in place with active thin secretions. Becomes dyspneic with prolonged conversation but breathing comfortably at rest. Lungs clear to auscultation bilaterally, no wheezes, rhonchi or rales.  Abdominal: Abdomen soft and non-distended. Normoactive bowel sounds. No tenderness to palpation.  Skin: Warm and dry with no rashes, cuts, or bruises MSK: Normal ROM of all extremities. Strength 5/5 in bilateral UE and LE   Assessment/Plan:  Principal  Problem:   Aspiration pneumonia of right lower lobe (HCC) Active Problems:   Laryngeal cancer (HCC)   Goals of care, counseling/discussion   Protein-calorie malnutrition, severe   Drug abuse and dependence ([place])   Community acquired pneumonia due to Pneumococcus ([place])   Pneumococcal bacteremia   Supraglottic airway obstruction   Chronic HFrEF (heart failure with reduced ejection fraction) (HCC)   Tracheostomy status (HCC)   Acute respiratory failure with hypoxia (HCC)   Bacterial endocarditis  This is a 46 year old David [name] with 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  Acute hypoxic respiratory failure with sepsis secondary to streptococcal pneumoniae pneumonia Patient with history of laryngeal cancer s/p radiation with significant post-radiation changes and chronic dysphagia presented with chronically labored respirations and new hypotension, tachycardia, tachypnea, hypoxemia, and oxygen requirement. CXR notable for right sided multilobar opacities with associated pleural effusion. WBC and lactic acid elevated. Strep pneumoniae urinary antigen positive and sensitive to ceftriaxone. Respiratory status continued to worsen, requiring BIPAP and subsequent ICU transfer with emergent tracheostomy placement. He initially required pressor support but has since been weaned and is now on midodrine. He continues to be hypotensive but is stable. Breathing comfortably with  trach at rest and saturating well with 10L 35% FiO2.  - Continue ceftriaxone and azithromycin  - Wean oxygen as tolerated  - Routine tracheostomy care - Guaifenesin for secretions    Streptococcus  pneumoniae bacteremia with concern for underlying endocarditis  Patient presented with sepsis secondary to streptococcus pneumoniae. One of two blood cultures positive for streptococcus pneumoniae on admission and cleared two days later with ceftriaxone therapy. TTE notable for thickened anterior mitral leaflet with possible small hypermobile density concerning for vegetation. High concern for underlying endocarditis given history of IV drug use and evidence of vegetation on TTE, will require further evaluation. - TEE 5/15 - Continue ceftriaxone, will need prolonged course if TEE positive for endocarditis   Hypotension  Patient with history of SBPs in 100s prior to admission, now with SBPs in 70s-80s. This is likely due to chronic malnutrition and currently exacerbated by critical illness. Patient was initially on pressor therapy in ICU but has since been weaned off. BP stable and patient asymptomatic from hypotension. Will aim for better control so patient can tolerate sedation for G-tube placement. - Increase midodrine to 10 mg TID  Protein calorie malnutrition, severe Patient with history of chronic malnutrition since undergoing radiation for laryngeal carcinoma wishes to have G-tube. Will aim for IR PEG tube placement after achieving better blood pressure control.  - Nectar thick liquids per SLP recommendation  - Continue NG tube feeds  Polysubstance use disorder  - Continue methadone  - Continue nicotine patch   Thrombocytopenia Patient with acute drop in platelets, initially concerning for HIT but subsequently up-trended so HIT is unlikely.  - HIT Ab pending  - Continue to monitor   LOS: 7 days   Halina Andreas, Medical Student 06/13/2021, 3:02 PM  I have reviewed the note by Orion Modest MS 4 and was present during the interview and physical exam.  I agree with the findings, assessment, and plan.  Lacinda Axon, MD 06/14/2021, 6:33 AM IM Resident, PGY-2 Pager:  [phone] Isaiah 41:10

## 2021-06-13 NOTE — Progress Notes (Signed)
I went back to give pt his HS meds per NGT and pt was sleeping soundly. Will cont to monitor ?

## 2021-06-13 NOTE — Progress Notes (Signed)
VIR progress note. ? ?Patient referred for possible image guided gastrostomy.  ? ?At this time, patient with persisting hypotension (SBP's 70's), with medical treatment for hypotension.  ? ?This is obstacle for moderate sedation.  Would not perform gastrostomy without moderate sedation.  ? ?Advise using the enteric tube for nutrition until the patient is HD stable for sedation.  ? ?Tentative plan to proceed at that time.  ? ?Signed, ? ?Dulcy Fanny. Earleen Newport, DO ? ? ?

## 2021-06-13 NOTE — Progress Notes (Signed)
Pt tearful. Grabbed my hand and stated don't leave me. Can't you stay here with me tonight. I explained I had other pt's and then I would come back and sit with him for awhile. Pt crying stating Am I going to die tonight? I explained he was doing good and [name] was looking over him protecting him. I explained I was going to go get the medicine the doctor ordered for his anxiety. He asked if it was Xanax and I told him No, it was Hydroxizine but it does the same thing for anxiety. Pt asked again if I could stay with him and I told him I had a couple more pts to see and then I would come back and check on him. Pt was ok with the plan. I gave the Atarax per NGT at 2049. ?

## 2021-06-13 NOTE — Progress Notes (Signed)
Message left for Respiratory to contact patients mother Nyoka Cowden) for education regarding tracheostomy care. ?

## 2021-06-13 NOTE — Progress Notes (Signed)
Speech Language Pathology Treatment: [name] Speaking valve;Dysphagia  ?Patient Details ?Name: David [name] ?MRN: [id number] ?DOB: [birth year] ?Today's Date: 06/13/2021 ?Time: 1015-1040 ?SLP Time Calculation (min) (ACUTE ONLY): 25 min ? ?Assessment / Plan / Recommendation ?Clinical Impression ? Patient seen by SLP for skilled treatment focused on PMV usage and dysphagia goals. When SLP entered room, he immediately requested suctioning and SLP informed RN who came and performed tracheal suctioning with patient. SpO2 at 87% and RR as high as 35 during suctioning but afterwards was maintained at 91-92% for SpO2 and RR low 20's. Trach cuff already deflated when SLP checked. SLP then donned PMV and patient tolerated without any observed changed in vitals. Patient did not report any pain or discomfort with PMV and was able to achieve voice, saying "it helps me talk". Voice was low in intensity and very hoarse. No evidence of air trapping when PMV briefly removed. PMV was left on for total duration of 20 minutes and doffed when SLP leaving room. When asked about willingness to try some PO's, patient requested chocolate pudding. He fed himself a few bites before saying he was done. Later in session he requested one bite of applesauce. He did not exhibit any overt s/s aspiration or penetration with PO intake but was observed to suction out what appeared to be trace to min amount of chocolate pudding from oral cavity. Patient's medical team in room during portion of session and plan continues to be for PEG when his BP is stable enough. SLP will continue to follow patient for PMV and dysphagia goals.  ?  ?HPI HPI: 46yo male with past medical history of laryngeal cancer s/p chemo and radiation and heroin abuse presented to ED on 06/06/21 from home with complaints of shortness of breath and not being able to eat for several days. Patient was admitted on 06/06/2021 with acute hypoxic respiratory failure with sepsis secondary  to aspiration pneumonia, elevated D-dimer, AKI. Developed worsening respiratory distress requiring emergent tracheostomy on 5/13. ?  ?   ?SLP Plan ? Continue with current plan of care ? ?  ?  ?Recommendations for follow up therapy are one component of a multi-disciplinary discharge planning process, led by the attending physician.  Recommendations may be updated based on patient status, additional functional criteria and insurance authorization. ?  ? ?Recommendations  ?Diet recommendations: Honey-thick liquid;Dysphagia 3 (mechanical soft) ?Medication Administration: Whole meds with puree ?Supervision: Patient able to self feed;Full supervision/cueing for compensatory strategies ?Compensations: Small sips/bites;Slow rate;Clear throat intermittently ?Postural Changes and/or Swallow Maneuvers: Seated upright 90 degrees  ?   ? Patient may use Passy-Muir Speech Valve: During all therapies with supervision;During all waking hours (remove during sleep) ?PMSV Supervision: Full ?MD: Please consider changing trach tube to : Cuffless;Smaller size  ?   ? ? ? ? Oral Care Recommendations: Oral care QID ?Follow Up Recommendations: Outpatient SLP ?Assistance recommended at discharge: Frequent or constant Supervision/Assistance ?SLP Visit Diagnosis: Dysphagia, pharyngeal phase (R13.13) ?Plan: Continue with current plan of care ? ? ? ? ?  ?  ? ? ?Sonia Baller, MA, CCC-SLP ?Speech Therapy ?

## 2021-06-13 NOTE — Progress Notes (Signed)
Physical Therapy Treatment ?Patient Details ?Name: David [name] ?MRN: [id number] ?DOB: 22-Jun-1975 ?Today's Date: 06/13/2021 ? ? ?History of Present Illness Pt adm 5/10 with acute hypoxic respiratory failure due to aspiration PNA and laryngeal obstruction due to stage IV head and neck CA. Pt had trach placed on 5/13. PMH - CHF, malnutrition, Heroin use now on methadone ? ?  ?PT Comments  ? ? On entry, HoB elevated to 30 degrees however pt slid down in bed. Pt agreeable to participation with therapy however with use of PMSV pt with increased coughing and difficulty with secretion clearance. Pt able to come to long sitting with min guard and scoot hips up in bed to provide better physiologic positioning of lungs for secretion clearance. Despite upright pt with increased coughing and minimal secretion clearance requesting deep suction. RN provided deep suction and with much effort pt able to clear secretions. Pt with increased fatigue and request PT follow back tomorrow for OOB. D/c plan remains appropriate at this time. PT will continue to follow acutely.  ?   ?Recommendations for follow up therapy are one component of a multi-disciplinary discharge planning process, led by the attending physician.  Recommendations may be updated based on patient status, additional functional criteria and insurance authorization. ? ?Follow Up Recommendations ? Home health PT ?  ?  ?Assistance Recommended at Discharge Frequent or constant Supervision/Assistance  ?Patient can return home with the following A little help with walking and/or transfers;A little help with bathing/dressing/bathroom;Assist for transportation;Assistance with cooking/housework;Help with stairs or ramp for entrance ?  ?Equipment Recommendations ? Rollator (4 wheels)  ?  ?Recommendations for Other Services   ? ? ?  ?Precautions / Restrictions Precautions ?Precautions: Fall ?Restrictions ?Weight Bearing Restrictions: No  ?  ? ?Mobility ? Bed Mobility ?Overal  bed mobility: Needs Assistance ?Bed Mobility: Supine to Sit, Sit to Supine ?  ?  ?Supine to sit: Min guard ?Sit to supine: Min guard ?  ?General bed mobility comments: pt comes to longsitting and is able to scoot up in bed for better lung function as he tries to clear secretions ?  ? ?  ? ? ?  ?Balance Overall balance assessment: Mild deficits observed, not formally tested ?  ?  ?  ?  ?  ?  ?  ?  ?  ?  ?  ?  ?  ?  ?  ?  ?  ?  ?  ? ?  ?Cognition Arousal/Alertness: Awake/alert ?Behavior During Therapy: [facility] for tasks assessed/performed ?Overall Cognitive Status: Within Functional Limits for tasks assessed ?  ?  ?  ?  ?  ?  ?  ?  ?  ?  ?  ?  ?  ?  ?  ?  ?General Comments: PMSV in place, raspy voice but able to understand pt needs ?  ?  ? ?  ?   ?General Comments General comments (skin integrity, edema, etc.): Pt on 35% trach collar, SpO2 > 90% O2 with the exception of during coughing fit when it dropped to mid 80s quickly recovered,  pt provided maximal encouragement during secretion clearing as pt tries to clear secretions to mouth for clearance, pt ultimately not able to clear deep secretions, pt request for deep suction and RN called, after deep suctioning and increased coughing for ultimate clearing of secretion pt exhausted and requests to be finished with PT ?  ?  ? ?Pertinent Vitals/Pain Pain Assessment ?Pain Assessment: Faces ?Faces Pain Scale: Hurts whole lot ?Pain Location:  chest and throat with trying to clear secretions ?Pain Descriptors / Indicators: Grimacing ?Pain Intervention(s): Monitored during session  ? ? ? ?PT Goals (current goals can now be found in the care plan section) Acute Rehab PT Goals ?Patient Stated Goal: not stated ?PT Goal Formulation: With patient ?Time For Goal Achievement: 06/25/21 ?Potential to Achieve Goals: Good ?Progress towards PT goals: Not progressing toward goals - comment (limited by increased effort for secretion clearance) ? ?  ?Frequency ? ? ? Min 3X/week ? ? ? ?  ?PT Plan  Current plan remains appropriate  ? ? ?   ?AM-PAC PT "6 Clicks" Mobility   ?Outcome Measure ? Help needed turning from your back to your side while in a flat bed without using bedrails?: None ?Help needed moving from lying on your back to sitting on the side of a flat bed without using bedrails?: A Little ?Help needed moving to and from a bed to a chair (including a wheelchair)?: A Little ?Help needed standing up from a chair using your arms (e.g., wheelchair or bedside chair)?: A Little ?Help needed to walk in hospital room?: A Little ?Help needed climbing 3-5 steps with a railing? : A Little ?6 Click Score: 19 ? ?  ?End of Session Equipment Utilized During Treatment: Oxygen ?Activity Tolerance: Patient limited by fatigue ?Patient left: in bed;with call bell/phone within reach;with bed alarm set ?Nurse Communication: Other (comment) (request for suction, need for air matress as pt with decreased tissue over bony prominences and decreased mobilization) ?PT Visit Diagnosis: Unsteadiness on feet (R26.81);Other abnormalities of gait and mobility (R26.89);Muscle weakness (generalized) (M62.81);Adult, failure to thrive (R62.7) ?  ? ? ?Time: 8341-9622 ?PT Time Calculation (min) (ACUTE ONLY): 24 min ? ?Charges:  $Therapeutic Activity: 8-22 mins          ?          ? ?David [name] PT, DPT ?Acute Rehabilitation Services ?Please use secure chat or  ?Call Office [phone] ? ? ? ?[place] ?06/13/2021, 4:37 PM ? ?

## 2021-06-13 NOTE — TOC Initial Note (Signed)
Transition of Care (TOC) - Initial/Assessment Note  ? ? ?Patient Details  ?Name: David [name] ?MRN: [id number] ?Date of Birth: 1975-11-20 ? ?Transition of Care (TOC) CM/SW Contact:    ?Cyndi Bender, RN ?Phone Number: ?06/13/2021, 2:48 PM ? ?Clinical Narrative:                 ?Spoke to patient at bedside regarding transition needs. Patient plans to go home with mother once stable. This RNCM spoke to patient to let him know that home health won't be an option due to new trach and insurance so mother will have to learn how to take care of trach. This RNCM spoke to the bedside nurse to have bedside nurse to reach out to Respiratory therapist to schedule time for Mother to learn trach care.  ?TOC will continue to follow for needs ? ? ?Expected Discharge Plan: [place] ?Barriers to Discharge: Continued Medical Work up ? ? ?Patient Goals and CMS Choice ?Patient states their goals for this hospitalization and ongoing recovery are:: return home with Mom ?  ?  ? ?Expected Discharge Plan and Services ?Expected Discharge Plan: [place] ?  ?  ?  ?  ?                ?  ?  ?  ?  ?  ?  ?  ?  ?  ?  ? ?Prior Living Arrangements/Services ?  ?  ?Patient language and need for interpreter reviewed:: Yes ?Do you feel safe going back to the place where you live?: Yes      ?Need for Family Participation in Patient Care: Yes (Comment) ?Care giver support system in place?: Yes (comment) ?  ?Criminal Activity/Legal Involvement Pertinent to Current Situation/Hospitalization: No - Comment as needed ? ?Activities of Daily Living ?  ?  ? ?Permission Sought/Granted ?  ?  ?   ?   ?   ?   ? ?Emotional Assessment ?Appearance:: Appears older than stated age ?Attitude/Demeanor/Rapport: Engaged ?Affect (typically observed): Accepting ?Orientation: : Oriented to Place, Oriented to Self, Oriented to  Time, Oriented to Situation ?Alcohol / Substance Use: Illicit Drugs ?Psych Involvement: No  (comment) ? ?Admission diagnosis:  Aspiration pneumonia ([place]) [J69.0] ?Aspiration pneumonia of right lower lobe, unspecified aspiration pneumonia type ([place]) [J69.0] ?Sepsis, due to unspecified organism, unspecified whether acute organ dysfunction present ([place]) [A41.9] ?Patient Active Problem List  ? Diagnosis Date Noted  ? Bacterial endocarditis   ? Tracheostomy status ([place])   ? Acute respiratory failure with hypoxia ([place])   ? Pneumococcal bacteremia 06/09/2021  ? Supraglottic airway obstruction 06/09/2021  ? Chronic HFrEF (heart failure with reduced ejection fraction) ([place]) 06/09/2021  ? Aspiration pneumonia of right lower lobe ([place]) 06/06/2021  ? Heart failure with reduced ejection fraction ([place]) 04/28/2021  ? 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 Community acquired pneumonia due to Pneumococcus ([place]) 04/01/2021  ? Elevated troponin 04/01/2021  ? Elevated transaminase level 04/01/2021  ? Drug abuse and dependence ([place]) 01/15/2021  ? Weight loss, unintentional 01/15/2021  ? Protein-calorie malnutrition, severe 01/10/2021  ? Hypotension 01/08/2021  ? AKI (acute kidney injury) ([place]) 01/08/2021  ? Hyponatremia 01/08/2021  ? Chemotherapy induced nausea and vomiting 01/01/2021  ? Goals of care, counseling/discussion 12/13/2020  ? Laryngeal cancer ([place]) 12/04/2020  ? Teeth missing 12/04/2020  ? Caries 12/04/2020  ? Retained tooth root 12/04/2020  ? 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 Accretions on teeth 12/04/2020  ? ?  PCP:  Delene Ruffini, MD ?Pharmacy:   ?[id number] [place], [place] - [address] ?[address]?Phone: [phone]: [phone]? ? ? ? ?Social Determinants of Health (SDOH) Interventions ?  ? ?Readmission Risk Interventions ?   ? View : No data to display.  ?  ?  ?  ? ? ? ?

## 2021-06-13 NOTE — Progress Notes (Signed)
5/17-Trach care education initiated with mom.  Left booklet with mom to look at. Mom will be in the facility at 9am in the morning for teaching. Mom appears to have had no pt care teaching yet and will need hands-on teaching for both respiratory and nursing care. ?

## 2021-06-13 NOTE — Progress Notes (Signed)
5/17-LVM for mom to s/up at time for trach care education for patient ?

## 2021-06-14 ENCOUNTER — Encounter (HOSPITAL_COMMUNITY): Payer: Self-pay | Admitting: Internal Medicine

## 2021-06-14 DIAGNOSIS — J9601 Acute respiratory failure with hypoxia: Secondary | ICD-10-CM | POA: Diagnosis not present

## 2021-06-14 DIAGNOSIS — I5022 Chronic systolic (congestive) heart failure: Secondary | ICD-10-CM | POA: Diagnosis not present

## 2021-06-14 DIAGNOSIS — I330 Acute and subacute infective endocarditis: Secondary | ICD-10-CM | POA: Diagnosis not present

## 2021-06-14 DIAGNOSIS — J690 Pneumonitis due to inhalation of food and vomit: Secondary | ICD-10-CM | POA: Diagnosis not present

## 2021-06-14 LAB — CBC
HCT: 27.7 % — ABNORMAL LOW (ref 39.0–52.0)
Hemoglobin: 9.4 g/dL — ABNORMAL LOW (ref 13.0–17.0)
MCH: 30 pg (ref 26.0–34.0)
MCHC: 33.9 g/dL (ref 30.0–36.0)
MCV: 88.5 fL (ref 80.0–100.0)
Platelets: 174 10*3/uL (ref 150–400)
RBC: 3.13 MIL/uL — ABNORMAL LOW (ref 4.22–5.81)
RDW: 14.2 % (ref 11.5–15.5)
WBC: 11.5 10*3/uL — ABNORMAL HIGH (ref 4.0–10.5)
nRBC: 0 % (ref 0.0–0.2)

## 2021-06-14 LAB — HCV RNA QUANT
HCV Quantitative Log: 5.899 log10 IU/mL (ref >1.70)
HCV Quantitative: 792000 IU/mL (ref >50)

## 2021-06-14 LAB — GLUCOSE, CAPILLARY
Glucose-Capillary: 99 mg/dL (ref 70–99)
Glucose-Capillary: 127 mg/dL — ABNORMAL HIGH (ref 70–99)
Glucose-Capillary: 96 mg/dL (ref 70–99)
Glucose-Capillary: 108 mg/dL — ABNORMAL HIGH (ref 70–99)
Glucose-Capillary: 97 mg/dL (ref 70–99)
Glucose-Capillary: 170 mg/dL — ABNORMAL HIGH (ref 70–99)

## 2021-06-14 LAB — BASIC METABOLIC PANEL
Anion gap: 9 (ref 5–15)
BUN: 14 mg/dL (ref 6–20)
CO2: 24 mmol/L (ref 22–32)
Calcium: 8.1 mg/dL — ABNORMAL LOW (ref 8.9–10.3)
Chloride: 97 mmol/L — ABNORMAL LOW (ref 98–111)
Creatinine, Ser: 0.45 mg/dL — ABNORMAL LOW (ref 0.61–1.24)
GFR, Estimated: >60 mL/min (ref >60)
Glucose, Bld: 116 mg/dL — ABNORMAL HIGH (ref 70–99)
Potassium: 5.4 mmol/L — ABNORMAL HIGH (ref 3.5–5.1)
Sodium: 130 mmol/L — ABNORMAL LOW (ref 135–145)

## 2021-06-14 MED ORDER — OXYCODONE HCL 5 MG PO TABS
5.0000 mg | ORAL_TABLET | Freq: Four times a day (QID) | ORAL | Status: DC | PRN
  Administered 2021-06-14 – 2021-07-06 (×55): 5 mg
  Filled 2021-06-14 (×58): qty 1

## 2021-06-14 MED ORDER — BISACODYL 10 MG RE SUPP
10.0000 mg | Freq: Once | RECTAL | Status: AC
  Administered 2021-06-14: 10 mg via RECTAL
  Filled 2021-06-14: qty 1

## 2021-06-14 MED ORDER — RIVAROXABAN 10 MG PO TABS
10.0000 mg | ORAL_TABLET | Freq: Every day | ORAL | Status: DC
  Administered 2021-06-14 – 2021-06-18 (×4): 10 mg
  Filled 2021-06-14 (×5): qty 1

## 2021-06-14 MED ORDER — LIDOCAINE 5 % EX PTCH
1.0000 | MEDICATED_PATCH | CUTANEOUS | Status: DC
  Administered 2021-06-14 – 2021-08-22 (×69): 1 via TRANSDERMAL
  Filled 2021-06-14 (×68): qty 1

## 2021-06-14 MED ORDER — HYDROXYZINE HCL 25 MG PO TABS
25.0000 mg | ORAL_TABLET | Freq: Four times a day (QID) | ORAL | Status: DC | PRN
  Administered 2021-06-14 – 2021-06-15 (×4): 25 mg
  Filled 2021-06-14 (×5): qty 1

## 2021-06-14 MED ORDER — SODIUM CHLORIDE 0.9 % IV SOLN
2.0000 g | INTRAVENOUS | Status: AC
  Administered 2021-06-14 – 2021-07-06 (×23): 2 g via INTRAVENOUS
  Filled 2021-06-14 (×26): qty 20

## 2021-06-14 MED ORDER — MELATONIN 3 MG PO TABS
3.0000 mg | ORAL_TABLET | Freq: Every evening | ORAL | Status: DC | PRN
  Administered 2021-06-14 – 2021-06-26 (×11): 3 mg
  Filled 2021-06-14 (×12): qty 1

## 2021-06-14 NOTE — TOC Progression Note (Signed)
Transition of Care [facility]) - Progression Note    Patient Details  Name: David [name] MRN: [id number] Date of Birth: [birth year]  [facility]) CM/SW Contact  Cyndi Bender, RN Phone Number: 06/14/2021, 11:46 AM  Clinical Narrative:    Judeen Hammans with Respiratory notified this RNCM that the patient's mother was too anxious to learn trach care at this time. Respiratory therapy will continue to try to teach mother.TOC will continue to follow   Expected Discharge Plan: [place] Barriers to Discharge: Continued Medical Work up  Expected Discharge Plan and Services Expected Discharge Plan: [place]                                               Social Determinants of Health (SDOH) Interventions    Readmission Risk Interventions     View : No data to display.

## 2021-06-14 NOTE — Progress Notes (Signed)
Subjective:  Overnight, patient had increased anxiety, requiring hydroxyzine. This morning he reports feeling worried about the state of his health and expresses his desire to live. He is worried about dying. Reports ongoing shortness of breath and worsening back pain from lying in bed. No abdominal pain, nausea, or vomiting.    Objective:  Vital signs in last 24 hours: Vitals:   06/14/21 0331 06/14/21 0333 06/14/21 0350 06/14/21 0400  BP: (!) 74/59 (!) 80/56  (!) 83/57  Pulse: 76 79 74 78  Resp: '19 19 17 20  '$ Temp:      TempSrc:      SpO2: 95% 95% 95% 97%  Weight:      Height:       Weight change:   Intake/Output Summary (Last 24 hours) at 06/14/2021 0649 Last data filed at 06/13/2021 2251 Gross per 24 hour  Intake 120 ml  Output 1200 ml  Net -1080 ml    General: Cachectic and fatigued appearing. Lying in bed and in no acute distress.  Neuro: A&O x3, normal affect HEENT: Normocephalic, atraumatic, EOMI, normal sclerae without scleral icterus or injection.  Cardiovascular: Regular rate and rhythm, no murmurs. No peripheral edema.  Pulmonary: Trach collar in place with active thin secretions. Becomes dyspneic with prolonged conversation but breathing comfortably at rest. Lungs clear to auscultation bilaterally, no wheezes, rhonchi or rales.  Abdominal: Abdomen soft and non-distended. Normoactive bowel sounds. No tenderness to palpation.  Skin: Warm and dry with no rashes, cuts, or bruises MSK: Mild midline tenderness to palpation over thoracic and lumbar vertebrae. No paravertebral tenderness to palpation. Normal ROM of all extremities. Strength 5/5 in bilateral UE and LE   Assessment/Plan:  Principal Problem:   Aspiration pneumonia of right lower lobe (HCC) Active Problems:   Laryngeal cancer (HCC)   Goals of care, counseling/discussion   Protein-calorie malnutrition, severe   Drug abuse and dependence ([place])   Community acquired pneumonia due to Pneumococcus ([place])    Pneumococcal bacteremia   Supraglottic airway obstruction   Chronic HFrEF (heart failure with reduced ejection fraction) (HCC)   Tracheostomy status (HCC)   Acute respiratory failure with hypoxia (HCC)   Bacterial endocarditis  This is a 46 year old male with 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  Streptococcus pneumoniae bacteremia with concern for underlying endocarditis  Patient presented with sepsis secondary to streptococcus pneumoniae. One of two blood cultures positive for streptococcus pneumoniae on admission and cleared two days later with ceftriaxone therapy. TTE notable for thickened anterior mitral leaflet with possible small hypermobile density concerning for vegetation. High concern for underlying endocarditis given history of IV drug use and evidence of vegetation on TTE, will require further evaluation. - TEE scheduled 5/15, will discuss feasibility with ENT and Cardiology given complex anatomy - Continue ceftriaxone, will need prolonged course if TEE positive for endocarditis   Acute hypoxic respiratory failure with sepsis secondary to streptococcal pneumoniae pneumonia Patient with history of laryngeal cancer s/p radiation with significant post-radiation changes and chronic dysphagia presented with chronically labored respirations and new hypotension, tachycardia, tachypnea, hypoxemia, and oxygen requirement. CXR notable for right sided multilobar opacities with associated pleural effusion. WBC and lactic acid elevated. Strep pneumoniae urinary antigen positive and sensitive  to ceftriaxone. Respiratory status continued to worsen, requiring  BIPAP and subsequent ICU transfer with emergent tracheostomy placement. He initially required pressor support but has since been weaned and is now on midodrine. He continues to be hypotensive but is stable. Breathing comfortably with trach at rest and saturating well with 8L 40% FiO2.  - Continue azithromycin. Completed 7d CTX for CAP but still on therapy for bacteremia - Wean oxygen as tolerated  - Routine tracheostomy care - Guaifenesin for secretions     Hypotension  Patient with history of SBPs in 100s prior to admission, now with SBPs in 70s-80s. This is likely due to chronic malnutrition and currently exacerbated by critical illness. Patient was initially on pressor therapy in ICU but has since been weaned off. BP stable and patient asymptomatic from hypotension. Will aim for better control so patient can tolerate sedation for G-tube placement. - Continue midodrine to 10 mg TID, will increase tomorrow if needed  Midline positional back pain Patient complaining of back pain in the setting of being hospitalized and immobile for >1 week. Likely positional.  - PRN tylenol, oxycodone, lidocaine patch    Protein calorie malnutrition, severe Patient with history of chronic malnutrition since undergoing radiation for laryngeal carcinoma wishes to have G-tube. Will aim for IR PEG tube placement after achieving better blood pressure control.  - Nectar thick liquids per SLP recommendation  - Continue NG tube feeds   Polysubstance use disorder  - Continue methadone  - Continue nicotine patch   Hepatitis C HCV RNA 792,000 on admission, indicative of chronic infection.  - Follow up outpatient for treatment   Thrombocytopenia - resolved Patient with acute drop in platelets, initially concerning for HIT but HIT antibody negative.     LOS: 8 days   Halina Andreas, Medical Student 06/14/2021, 6:49 AM

## 2021-06-14 NOTE — Progress Notes (Signed)
Nutrition Follow-up  DOCUMENTATION CODES:   Severe malnutrition in context of chronic illness, Underweight  INTERVENTION:   Tube feeding via Cortrak tube: Osmolite 1.5 at 45 mL/h (1080 mL per day). Provides 1620 kcal, 68 gm protein, 823 ml free water daily. Meal ordering with assist  NUTRITION DIAGNOSIS:   Severe Malnutrition related to chronic illness (head and neck cancer) as evidenced by severe muscle depletion, severe fat depletion, percent weight loss (23% weight loss within 3 months). - Ongoing  GOAL:   Patient will meet greater than or equal to 90% of their needs - Being met with TF  MONITOR:   PO intake, Supplement acceptance, Labs, Weight trends  REASON FOR ASSESSMENT:   Consult Assessment of nutrition requirement/status  ASSESSMENT:   46 yo male admitted with respiratory failure, suspected aspiration PNA. 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  5/12 - Cortrak placed (tip post pyloric) 5/13 - TF started; Trach placed 5/15 - diet advanced to Dysphagia 3, Honey thick liquids  Pt with no bowel movement since admission and noted to have a moderate stool collection within his colon in CT on 5/15. Pt confirmed no BM this admission. Reached out to MD.  Pt reports that he had not received breakfast. RD to make meal ordering with assist.   Per MD note, plan for PEG once pt blood pressure stabilizes. Will continue to follow and transition to bolus feeds when more appropriate.   Medications reviewed and include: Folic acid, MVI, Protonix, Miralax, Senokot, Thiamine, IV antibiotics  Labs reviewed: Sodium 130, Potassium 5.4, Creatinine 0.45, Magnesium 1.6, 24 hr CBG 99-190  Diet Order:   Diet Order             DIET DYS 3 Room service appropriate? Yes; Fluid consistency: Honey Thick  Diet effective now                   EDUCATION NEEDS:   No education  needs have been identified at this time  Skin:  Skin Assessment: Reviewed RN Assessment  Last BM:  PTA  Height:   Ht Readings from Last 1 Encounters:  06/07/21 5' 11"  (1.803 m)    Weight:   Wt Readings from Last 1 Encounters:  06/12/21 42.1 kg    Ideal Body Weight:  78.2 kg  BMI:  Body mass index is 12.94 kg/m.  Estimated Nutritional Needs:   Kcal:  1500-1700  Protein:  65-75 gm  Fluid:  1.5-1.7 L   Hermina Barters RD, LDN Clinical Dietitian See [facility] for contact information.

## 2021-06-14 NOTE — Progress Notes (Signed)
Heparin induced platelet antibody test is negative for HIT. Heparin allergy removed per protocol.   Braddock Servellon A. Levada Dy, PharmD, BCPS, FNKF Clinical Pharmacist  Please utilize [employer] for appropriate phone number to reach the unit pharmacist (E. Lopez)

## 2021-06-14 NOTE — Progress Notes (Signed)
Subjective:  No acute events overnight. NPO since midnight for PEG tube placement today. This morning he reports feeling hungry and having ongoing back pain. Denies shortness of breath or chest pain.   Objective:  Vital signs in last 24 hours: Vitals:   06/14/21 0800 06/14/21 0812 06/14/21 1202 06/14/21 1501  BP: (!) 86/57  (!) 86/60   Pulse: 80 76 69 71  Resp: (!) 25 (!) 23 (!) 26 20  Temp: 97.9 F (36.6 C)  97.8 F (36.6 C)   TempSrc: Axillary  Axillary   SpO2: 98% 96% 96% 97%  Weight:      Height:       Weight change:   Intake/Output Summary (Last 24 hours) at 06/14/2021 1518 Last data filed at 06/14/2021 1000 Gross per 24 hour  Intake 130 ml  Output 1200 ml  Net -1070 ml   General: Cachectic and fatigued appearing. Lying in bed and in no acute distress.  Neuro: A&O x3, normal affect HEENT: Normocephalic, atraumatic, EOMI, normal sclerae without scleral icterus or injection.  Cardiovascular: Regular rate and rhythm, no murmurs. No peripheral edema.  Pulmonary: Trach collar in place with active thin secretions. Becomes dyspneic with prolonged conversation but breathing comfortably at rest. Diffusely coarse breath sounds bilaterally but no wheezing or focal crackles.  Abdominal: Abdomen soft and non-distended. Normoactive bowel sounds. No tenderness to palpation.  Skin: Warm and dry with no rashes, cuts, or bruises  Assessment/Plan:  Principal Problem:   Aspiration pneumonia of right lower lobe (HCC) Active Problems:   Laryngeal cancer (HCC)   Goals of care, counseling/discussion   Protein-calorie malnutrition, severe   Drug abuse and dependence ([place])   Community acquired pneumonia due to Pneumococcus ([place])   Pneumococcal bacteremia   Supraglottic airway obstruction   Chronic HFrEF (heart failure with reduced ejection fraction) (HCC)   Tracheostomy status (HCC)   Acute respiratory failure with hypoxia (HCC)   Bacterial endocarditis  This is a 46 year old male  with 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   Streptococcus pneumoniae bacteremia with concern for underlying endocarditis  Patient presented with sepsis secondary to streptococcus pneumoniae. One of two blood cultures positive for streptococcus pneumoniae on admission and cleared two days later with ceftriaxone therapy. TTE notable for thickened anterior mitral leaflet with possible small hypermobile density concerning for vegetation. High concern for underlying endocarditis given history of IV drug use and evidence of vegetation on TTE. Per discussion with cardiology, patient is not a candidate for TEE given the history of radiation so will treat empirically for endocarditis.  - Continue ceftriaxone x 4 weeks (start date 5/12, date of negative blood cultures) - Will need to explore disposition options since patient is not a candidate of home IV antibiotics with history of IV drug use  Leukocytosis WBC up to 15 this morning from 11.5. Patient has remained afebrile and stable. Denies shortness of breath, GI discomfort, or chills. Endorsing back pain but more likely to be positional than infectious. Unclear etiology at this time, will continue to monitor.  - Recheck in AM - If patient develops fever, obtain blood cultures x2  Protein calorie malnutrition, severe Patient with history of chronic malnutrition since undergoing radiation for laryngeal carcinoma  wishes to have G-tube.  - IR PEG tube placement scheduled 5/19 - Nectar thick liquids per SLP recommendation   Midline positional back pain Patient complaining of back pain in the setting of being  hospitalized and immobile for >1 week. Likely positional. Lower concern for infection but will monitor closely in the setting of new leukocytosis.  - PRN tylenol, oxycodone, lidocaine patch    Acute hypoxic respiratory failure with sepsis secondary to streptococcal pneumoniae pneumonia Patient with history of laryngeal cancer s/p radiation with significant post-radiation changes and chronic dysphagia presented with chronically labored respirations. CXR notable for right sided multilobar opacities with associated pleural effusion. WBC and lactic acid elevated. Strep pneumoniae urinary antigen positive and sensitive to ceftriaxone. Respiratory status continued to worsen, requiring BIPAP and subsequent ICU transfer with emergent tracheostomy placement. He initially required pressor support but has since been weaned and is now on midodrine. He continues to be hypotensive but is stable. Breathing comfortably with trach at rest and saturating well with 8L 40% FiO2.  - Continue azithromycin (end date 5/20). Completed 7d CTX for CAP but still on therapy for bacteremia - Wean oxygen as tolerated  - Routine tracheostomy care - Guaifenesin for secretions     Hypotension  Patient with history of SBPs in 100s prior to admission, now with SBPs in 70s-80s. This is likely due to chronic malnutrition and currently exacerbated by critical illness. Patient was initially on pressor therapy in ICU but has since been weaned off. BP stable and patient asymptomatic from hypotension.  - Continue midodrine to 10 mg TID   Polysubstance use disorder  - Continue methadone  - Continue nicotine patch    Hepatitis C HCV RNA 792,000 on admission, indicative of chronic infection.  - Follow up outpatient for treatment   Thrombocytopenia - resolved Patient with acute drop in platelets, initially concerning for HIT but HIT antibody negative and platelet count normal.    LOS: 8 days   Halina Andreas, Medical  Student 06/14/2021, 3:18 PM

## 2021-06-14 NOTE — Progress Notes (Signed)
5/18-Attempted trach care education with mom. She is very nervous and continually voices concerns that she can not care for the patient. She feels that the patient will not cooperate with her once they are home. Physically unless the patient is able to do part of the care she feels that she will not be able to do the care that he needs.  Will attempt to follow up with the mom each day that she comes for a visit to try to make her more comfortable with the trach care.  Concerns also relayed to the case manager.

## 2021-06-14 NOTE — Consult Note (Signed)
Chief Complaint: Patient was seen in consultation today for protein calorie malnutrition   Referring Physician(s): Dr. Madalyn Rob  Supervising Physician: Mir, Sharen Heck  Patient Status: [facility] - In-pt  History of Present Illness: David [name] is a 46 y.o. 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  Patient with history of compromised respiratory status/airway and previously had declined intubation and feeding tube placement.  However, since admission he has agreed to trach placement as well as feeding tube for long-term care.  IR consulted for placement.   CT Abdomen Pelvis 06/11/21 reviewed by Dr. Gerhard Perches who notes patient is a candidate for gastrostomy tube placement in IR.   Patient assessed at bedside.  He is attempting to eat a meal however cannot tolerate more than a few bites due to fatigue and early satiety. He is now agreeable to feeding tube placement.   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[place])    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 ([place])    Pneumothorax    Left lung spontaneous pneumothorax at age 68 yr     Past Surgical History:  Procedure Laterality Date   chest tubes Left    TRACHEOSTOMY TUBE PLACEMENT N/A 06/09/2021   Procedure: AWAKE TRACHEOSTOMY;  Surgeon: Melida Quitter, MD;  Location: [facility] OR;  Service: ENT;  Laterality: N/A;    Allergies: Patient has no known allergies.  Medications: Prior to Admission medications   Medication Sig Start Date End Date Taking? Authorizing Provider  carvedilol (COREG) 6.25 MG tablet Take 1 tablet (6.25 mg total) by mouth 2 (two) times daily. 04/25/21 04/25/22 Yes Delene Ruffini, MD  clotrimazole (MYCELEX) 10 MG troche Take 1 tablet (10 mg total) by mouth 5 (five) times daily. Suck on these slowly. 02/26/21  Yes [name], MD  guaiFENesin (MUCINEX) 600 MG 12 hr tablet Take 2  tablets (1,200 mg total) by mouth 2 (two) times daily. Patient taking differently: Take 1,200 mg by mouth 2 (two) times daily as needed for cough or to loosen phlegm. 02/26/21  Yes Pickenpack-Cousar, Athena N, NP  lidocaine (XYLOCAINE) 2 % solution Patient: Mix 1part 2% viscous lidocaine, 1part H20. Swish/swallow 24m of diluted mixture, 314m before meals and '@bedtime'$ , up to QID prn soreness 02/26/21  Yes SqEppie GibsonMD  methadone (DOLOPHINE) 10 MG/ML solution Take 6 mLs (60 mg total) by mouth daily. Patient taking differently: Take 50 mg by mouth daily. 01/08/21  Yes Pickenpack-Cousar, AtCarlena SaxNP  ondansetron (ZOFRAN) 8 MG tablet Take 1 tablet (8 mg total) by mouth every 8 (eight) hours as needed for nausea or vomiting. Starting day 3 after chemotherapy 05/17/21  Yes IrBenay PikeMD  amoxicillin-clavulanate (AUGMENTIN) 875-125 MG tablet Take 1 tablet by mouth every 12 (twelve) hours. Patient not taking: Reported on 06/06/2021 04/06/21   HoLuna FuseMD  dronabinol (MARINOL) 2.5 MG capsule Take 2.5 mg by mouth 2 (two) times daily before a meal. Patient not taking: Reported on 03/31/2021    [provider]  nicotine (NICODERM CQ - DOSED IN MG/24 HOURS) 14 mg/24hr patch Place 1 patch (14 mg total) onto the skin daily. Apply 21 mg patch daily x 6 wk, then '14mg'$  patch daily x 2 wk, then 7 mg patch daily x 2 wk Patient not taking: Reported on 06/06/2021 02/26/21   SqEppie GibsonMD  sacubitril-valsartan (ENTRESTO) 49-51 MG Take 1 tablet by  mouth 2 (two) times daily. Patient not taking: Reported on 06/06/2021 04/25/21   Delene Ruffini, MD  traMADol (ULTRAM) 50 MG tablet Take 1 tablet (50 mg total) by mouth 2 (two) times daily. Patient not taking: Reported on 06/06/2021 02/26/21   Pickenpack-Cousar, Carlena Sax, NP     History reviewed. No pertinent family history.  Social History   Socioeconomic History   Marital status: Divorced    Spouse name: Not on file   Number of children: Not on  file   Years of education: Not on file   Highest education level: Not on file  Occupational History   Not on file  Tobacco Use   Smoking status: Every Day    Packs/day: 3.00    Types: Cigarettes   Smokeless tobacco: Never  Vaping Use   Vaping Use: Never used  Substance and Sexual Activity   Alcohol use: Not Currently    Alcohol/week: 12.0 standard drinks    Types: 12 Cans of beer per week   Drug use: Not Currently    Types: IV    Comment: 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)   Sexual activity: Not on file  Other Topics Concern   Not on file  Social History Narrative   Not on file   Social Determinants of Health   Financial Resource Strain: Not on file  Food Insecurity: Not on file  Transportation Needs: Not on file  Physical Activity: Not on file  Stress: Not on file  Social Connections: Not on file     Review of Systems: A 12 point ROS discussed and pertinent positives are indicated in the HPI above.  All other systems are negative.  Review of Systems  Constitutional:  Negative for fatigue and fever.  Respiratory:  Negative for cough and shortness of breath.   Cardiovascular:  Negative for chest pain.  Gastrointestinal:  Negative for abdominal pain.  Musculoskeletal:  Negative for back pain.  Psychiatric/Behavioral:  Negative for confusion.    Vital Signs: BP (!) 86/60 (BP Location: Left Arm)   Pulse 71   Temp 97.8 F (36.6 C) (Axillary)   Resp 20   Ht '5\' 11"'$  (1.803 m) Comment: from previous admission  Wt 92 lb 13 oz (42.1 kg)   SpO2 97%   BMI 12.94 kg/m   Physical Exam Vitals and nursing note reviewed.  Constitutional:      General: He is not in acute distress.    Appearance: He is normal weight.  Cardiovascular:     Rate and Rhythm: Normal rate and regular rhythm.  Pulmonary:     Comments: Trach in place, managing secretions Musculoskeletal:     Cervical back: Normal range of motion.  Skin:    General: Skin is warm and dry.   Neurological:     General: No focal deficit present.     Mental Status: He is alert and oriented to person, place, and time.  Psychiatric:        Mood and Affect: Mood normal.        Behavior: Behavior normal.     MD Evaluation Airway: Other (comments) Airway comments: trach in place Heart: WNL Abdomen: WNL Chest/ Lungs: WNL ASA  Classification: 3 Mallampati/Airway Score: Two   Imaging: CT ABDOMEN WO CONTRAST  Result Date: 06/11/2021 CLINICAL DATA:  dysphagia EXAM: CT ABDOMEN WITHOUT CONTRAST TECHNIQUE: Multidetector CT imaging of the abdomen was performed following the standard protocol without IV contrast. RADIATION DOSE REDUCTION: This exam was performed according to  the departmental dose-optimization program which includes automated exposure control, adjustment of the mA and/or kV according to patient size and/or use of iterative reconstruction technique. COMPARISON:  None Available. FINDINGS: Abdomen is scaphoid with severe loss of the subcutaneous and mesenteric fat of likely cachexia. There is an NG tube with its tip at the pylorus. There are streak artifacts of right upper extremity at the right side of the patient. Lower chest: There is consolidation at the right lower lobe anterior lesser extent right middle lobe seen. Small right pleural effusion. There is some atelectasis with pleural thickening seen at the left lung base. Hepatobiliary: The liver is somewhat hyperdense. No intrahepatic biliary dilatation. No radiopaque gallstones. Pancreas: Grossly unremarkable Spleen: Normal in size without focal abnormality. Adrenals/Urinary Tract: Unremarkable Stomach/Bowel: Stomach is within normal limits. Appendix is not identified. No evidence of bowel wall thickening, distention, or obvious inflammatory changes. Moderate stool in the colon. Vascular/Lymphatic: Atheromatous calcifications. No abdominal aortic aneurysm. Musculoskeletal: No acute or significant osseous findings. IMPRESSION:  Severe loss of the subcutaneous and mesenteric fat with scaphoid abdomen of likely cachexia. Tip of the NG tube is seen at the pylorus. Patient may be a candidate for gastrostomy tube insertion if required. Large area of consolidation in the right lower lobe and to a lesser extent in the right middle lobe of likely pneumonia. Electronically Signed   By: Frazier Richards M.D.   On: 06/11/2021 14:26   DG Chest Port 1 View  Result Date: 06/08/2021 CLINICAL DATA:  Shortness of breath EXAM: PORTABLE CHEST 1 VIEW COMPARISON:  Chest x-ray dated Jun 07, 2021 FINDINGS: Cardiac and mediastinal contours are unchanged. Unchanged consolidation of the mid and lower right hemithorax. No large pleural effusion or evidence of pneumothorax. IMPRESSION: Unchanged consolidation of the mid and lower right hemithorax. Electronically Signed   By: Yetta Glassman M.D.   On: 06/08/2021 07:58   DG Chest Port 1 View  Result Date: 06/07/2021 CLINICAL DATA:  Pneumonia EXAM: PORTABLE CHEST 1 VIEW COMPARISON:  Yesterday FINDINGS: Pneumonia which is extensive on the right. New mild reticulonodular density at the left base. No visible cavitation or effusion. Improved inflation of the right lower lobe. Normal heart size. IMPRESSION: Right more than left multifocal pneumonia, progressed on the left. Electronically Signed   By: Jorje Guild M.D.   On: 06/07/2021 06:27   DG Chest Port 1 View  Result Date: 06/06/2021 CLINICAL DATA:  Shortness of breath. EXAM: PORTABLE CHEST 1 VIEW COMPARISON:  PET 05/14/2021 and chest radiograph 04/06/2021. FINDINGS: Trachea is midline. Heart size normal. Dense airspace consolidation in the right upper, right middle and right lower lobes. Air-fluid level is seen in lower right hemithorax, laterally. Associated right pleural fluid. Left lung is clear. Gaseous distension of bowel in the upper abdomen. IMPRESSION: 1. Right-sided multilobar pneumonia. Difficult to exclude associated necrosis or abscess.  Consider CT chest with contrast in further evaluation, as clinically indicated. Of note, no evidence of hypermetabolic malignancy in the chest on PET 05/14/2021. 2. Right parapneumonic effusion. 3. Gaseous distension of bowel in the upper abdomen. Electronically Signed   By: Lorin Picket M.D.   On: 06/06/2021 11:26   DG Abd Portable 1V  Result Date: 06/08/2021 CLINICAL DATA:  Feeding tube placement EXAM: PORTABLE ABDOMEN - 1 VIEW COMPARISON:  None Available. FINDINGS: Nonobstructive pattern of bowel gas. Nonweighted enteric feeding tube with tip projecting over the expected vicinity descending duodenum. Probable hiatal hernia. Heterogeneous airspace opacity of the right lung. IMPRESSION: 1. Nonweighted enteric feeding  tube with tip projecting over the expected vicinity of the descending duodenum. 2. Heterogeneous airspace opacity of the right lung. Electronically Signed   By: Delanna Ahmadi M.D.   On: 06/08/2021 10:55   DG Swallowing Func-Speech Pathology  Result Date: 06/11/2021 Table formatting from the original result was not included. Objective Swallowing Evaluation: Type of Study: MBS-Modified Barium Swallow Study  Patient Details Name: David [name] MRN: [id number] Date of Birth: December 04, 1975 Today's Date: 06/11/2021 Time: SLP Start Time (ACUTE ONLY): 1345 -SLP Stop Time (ACUTE ONLY): 1410 SLP Time Calculation (min) (ACUTE ONLY): 25 min Past Medical History: Past Medical History: Diagnosis Date  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[place])   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 ([place])   Pneumothorax   Left lung spontaneous pneumothorax at age 24 yr  Past Surgical History: Past Surgical History: Procedure Laterality Date  chest tubes Left   TRACHEOSTOMY TUBE PLACEMENT N/A 06/09/2021  Procedure: AWAKE TRACHEOSTOMY;  Surgeon: Melida Quitter, MD;  Location: [facility] OR;  Service: ENT;  Laterality: N/A; HPI: 46yo male with past medical history of laryngeal cancer s/p chemo and radiation and heroin abuse presented to ED  on 06/06/21 from home with complaints of shortness of breath and not being able to eat for several days. Patient was admitted on 06/06/2021 with acute hypoxic respiratory failure with sepsis secondary to aspiration pneumonia, elevated D-dimer, AKI. Developed worsening respiratory distress requiring emergent tracheostomy on 5/13.  Subjective: Pt seen in radiology to identify least restrictive diet.  Recommendations for follow up therapy are one component of a multi-disciplinary discharge planning process, led by the attending physician.  Recommendations may be updated based on patient status, additional functional criteria and insurance authorization. Assessment / Plan / Recommendation   06/11/2021   2:00 PM Clinical Impressions                                                    Pt seen in radiology for instrumental assessment of swallow function and safety, and to identity least restrictive diet. Pt had PMSV in place during this evaluation.  Oral phase of the swallow is [facility]. There was no anterior leakage or residue after the swallow on any texture.  Pharyngeal swallow was characterized by premature spillage over the tongue base. Delayed swallow reflex was noted, with trigger occurring at the pyriform sinus at times. Pt exhibited vallecular residue on nectar thick liquids, and puree. Pt exhibited multiple dry swallows to clear the vallecular residue.  Residue was noted in the vallecular space, lateral channels, and pyriform sinuses on honey thick liquids. Pt again demonstrated multiple dry swallows to clear. Chin tuck position with effortful swallow was ineffective to remove post-swallow residue.  ASPIRATION was seen on nectar thick liquids. Pt did cough in response to aspiration, however, he exhibited coughing throughout this study, unrelated to penetration or aspiration. At this time, a mechanical soft diet with honey thick liquids is recommended. Pt should have assistance with cutting solids, feeding, etc. SLP will  continue to follow to assess diet tolerance, provide pharyngeal strengthening exercises, and continue education to maximize swallow safety.  SLP Visit Diagnosis Dysphagia, pharyngeal phase (R13.13) Impact on safety and function Moderate aspiration risk;Severe aspiration risk     06/11/2021   2:00 PM Treatment Recommendations Treatment Recommendations Therapy as outlined in treatment plan below     06/11/2021   2:00 PM  Prognosis Prognosis for Safe Diet Advancement Fair Barriers to Reach Goals Motivation   06/11/2021   2:00 PM Diet Recommendations SLP Diet Recommendations Dysphagia 3 (Mech soft) solids;Honey thick liquids Liquid Administration via Spoon Medication Administration Whole meds with puree Compensations Small sips/bites slow rate Clear throat intermittently  Postural Changes Seated upright at 90 degrees Remain semi-upright after after feeds/meals     06/11/2021   2:00 PM Other Recommendations Oral Care Recommendations Oral care BID Follow Up Recommendations Outpatient SLP Assistance recommended at discharge Frequent or constant Supervision/Assistance   06/11/2021   2:00 PM Frequency and Duration  Speech Therapy Frequency (ACUTE ONLY) min 2x/week Treatment Duration 4 weeks     06/11/2021   2:00 PM Oral Phase Oral Phase [facility]    06/11/2021   2:00 PM Pharyngeal Phase Pharyngeal Phase Impaired    06/11/2021   2:00 PM Cervical Esophageal Phase  Cervical Esophageal Phase [facility] Celia B. Quentin Ore, [facility], CCC-SLP Speech Language Pathologist Office: [phone] [name] 06/11/2021, 2:51 PM                     ECHOCARDIOGRAM LIMITED  Result Date: 06/08/2021    ECHOCARDIOGRAM REPORT   Patient Name:   David [name] Date of Exam: 06/08/2021 Medical Rec #:  [id number]            Height:       71.0 in Accession #:    [id number]           Weight:       84.2 lb Date of Birth:  [birth year]            BSA:          1.460 m Patient Age:    77 years             BP:           115/85 mmHg Patient Gender: M                     HR:           61 bpm. Exam Location:  Inpatient Procedure: Limited Echo, Limited Color Doppler and Cardiac Doppler Indications:    bacteremia  History:        Patient has prior history of Echocardiogram examinations, most                 recent 04/01/2021.  Sonographer:    Johny Chess RDCS Referring Phys: [id number] Julian Hy  Sonographer Comments: Image acquisition challenging due to respiratory motion. IMPRESSIONS  1. Left ventricular ejection fraction, by estimation, is 50 to 55%. The left ventricle has low normal function. The left ventricle has no regional wall motion abnormalities.  2. Right ventricular systolic function is normal. The right ventricular size is normal. Conclusion(s)/Recommendation(s): This is a limited echo. EF has improved from prior echo. Anterior mitral valve leaflet appears to be thickened, with possible small hypermobile density c/f vegetation (image 10). Recommend a TEE to r/o endocarditis. FINDINGS  Left Ventricle: Left ventricular ejection fraction, by estimation, is 50 to 55%. The left ventricle has low normal function. The left ventricle has no regional wall motion abnormalities. The left ventricular internal cavity size was normal in size. There is no left ventricular hypertrophy. Right Ventricle: The right ventricular size is normal. Right ventricular systolic function is normal. Phineas Inches Electronically signed by Phineas Inches Signature Date/Time: 06/08/2021/4:19:09 PM    Final     Labs:  CBC: Recent  Labs    06/11/21 0128 06/12/21 0852 06/13/21 0145 06/14/21 0523  WBC 6.3 6.9 9.6 11.5*  HGB 9.5* 9.5* 10.1* 9.4*  HCT 28.5* 28.8* 31.2* 27.7*  PLT PLATELET CLUMPS NOTED ON SMEAR, COUNT APPEARS DECREASED 85* 121* 174    COAGS: Recent Labs    12/08/20 1111 03/31/21 2104  INR 1.1 1.2  APTT  --  33    BMP: Recent Labs    06/10/21 1701 06/12/21 0852 06/13/21 0145 06/14/21 0240  NA 135 131* 131* 130*  K 4.2 4.2 4.7 5.4*  CL 99 99 96* 97*  CO2 '28 28 26  24  '$ GLUCOSE 110* 125* 75 116*  BUN '13 11 11 14  '$ CALCIUM 7.7* 8.0* 8.0* 8.1*  CREATININE 0.37* 0.41* 0.46* 0.45*  GFRNONAA >60 >60 >60 >60    LIVER FUNCTION TESTS: Recent Labs    03/30/21 0856 03/31/21 2104 06/06/21 1010 06/08/21 0418  BILITOT 0.6 0.7 1.0 0.4  AST 88* 173* 50* 42*  ALT 39 52* 22 21  ALKPHOS 73 66 63 50  PROT 9.1* 9.0* 7.2 4.9*  ALBUMIN 4.3 4.0 2.3* <1.5*    TUMOR MARKERS: No results for input(s): AFPTM, CEA, CA199, CHROMGRNA in the last 8760 hours.  Assessment and Plan: Dysphagia Severe malnutrition of chronic illness.  Patient with history of laryngeal cancer s/p completed therapy now with failure to thrive and respiratory compromise.  He underwent trach placement and is now in need of feeding tube placement for long-term care.  CT Abdomen reviewed by Dr. Gerhard Perches who approves patient for the procedure.  He doe have an NGT in place.  Patient is agreeable.   IR is following for placement when vital signs are stable and BP is optimized for sedation.  Checking daily.  Will make NPO p MN tonight for possible procedure tomorrow pending clinical status and IR schedule.   Risks and benefits image guided gastrostomy tube placement was discussed with the patient including, but not limited to the need for a barium enema during the procedure, bleeding, infection, peritonitis and/or damage to adjacent structures.  All of the patient's questions were answered, patient is agreeable to proceed.  Consent signed and in chart.  Thank you for this interesting consult.  I greatly enjoyed meeting David [name] and look forward to participating in their care.  A copy of this report was sent to the requesting provider on this date.  Electronically Signed: Docia Barrier, PA 06/14/2021, 3:03 PM   I spent a total of 40 Minutes    in face to face in clinical consultation, greater than 50% of which was counseling/coordinating care for dysphagia, severe  malnutrition of chronic illness.

## 2021-06-14 NOTE — Progress Notes (Signed)
BRIEF ID NOTE:  Noted positive hepatitis C RNA > 700,000 indicating chronic infection. Will determine if there is benefit to treatment outpatient   Awaiting TEE candidacy / study to make final determinations re: duration of IV ceftriaxone    Janene Madeira, MSN, NP-C [facility] [email or web address] Pager: [phone] Office: [phone] [facility] Main Line: [place] Communication Welcome

## 2021-06-15 LAB — CBC
HCT: 30 % — ABNORMAL LOW (ref 39.0–52.0)
Hemoglobin: 9.7 g/dL — ABNORMAL LOW (ref 13.0–17.0)
MCH: 29.3 pg (ref 26.0–34.0)
MCHC: 32.3 g/dL (ref 30.0–36.0)
MCV: 90.6 fL (ref 80.0–100.0)
Platelets: 227 10*3/uL (ref 150–400)
RBC: 3.31 MIL/uL — ABNORMAL LOW (ref 4.22–5.81)
RDW: 13.9 % (ref 11.5–15.5)
WBC: 15 10*3/uL — ABNORMAL HIGH (ref 4.0–10.5)
nRBC: 0 % (ref 0.0–0.2)

## 2021-06-15 LAB — GLUCOSE, CAPILLARY
Glucose-Capillary: 107 mg/dL — ABNORMAL HIGH (ref 70–99)
Glucose-Capillary: 156 mg/dL — ABNORMAL HIGH (ref 70–99)
Glucose-Capillary: 86 mg/dL (ref 70–99)
Glucose-Capillary: 73 mg/dL (ref 70–99)
Glucose-Capillary: 137 mg/dL — ABNORMAL HIGH (ref 70–99)

## 2021-06-15 LAB — BASIC METABOLIC PANEL
Anion gap: 7 (ref 5–15)
BUN: 14 mg/dL (ref 6–20)
CO2: 29 mmol/L (ref 22–32)
Calcium: 8.1 mg/dL — ABNORMAL LOW (ref 8.9–10.3)
Chloride: 94 mmol/L — ABNORMAL LOW (ref 98–111)
Creatinine, Ser: 0.43 mg/dL — ABNORMAL LOW (ref 0.61–1.24)
GFR, Estimated: >60 mL/min (ref >60)
Glucose, Bld: 82 mg/dL (ref 70–99)
Potassium: 4.5 mmol/L (ref 3.5–5.1)
Sodium: 130 mmol/L — ABNORMAL LOW (ref 135–145)

## 2021-06-15 MED ORDER — GUAIFENESIN 100 MG/5ML PO LIQD
5.0000 mL | Freq: Four times a day (QID) | ORAL | Status: DC | PRN
  Administered 2021-06-18 – 2021-06-27 (×8): 5 mL
  Filled 2021-06-15 (×8): qty 5

## 2021-06-15 MED ORDER — GUAIFENESIN 100 MG/5ML PO LIQD: 5.0000 mL | Freq: Four times a day (QID) | ORAL | Status: DC | PRN

## 2021-06-15 MED ORDER — METHADONE HCL 10 MG PO TABS
50.0000 mg | ORAL_TABLET | Freq: Every day | ORAL | Status: DC
  Administered 2021-06-15 – 2021-06-28 (×15): 50 mg
  Filled 2021-06-15 (×14): qty 5

## 2021-06-15 NOTE — Progress Notes (Signed)
PT's MEWS remains a yellow driven mainly by systolic B/P. PT V/S for past 24 hours has had a B/P range 87-75/64-56 with HR 70-75. PT is not Beta blocked. PT appears in no outward acute distress. Dr. Cain Sieve and team notified. No new orders. PT remains on PO Midodrine.     06/15/21 1000  Assess: MEWS Score  BP (!) 80/59  Pulse Rate 72  ECG Heart Rate 73  Resp 17  Level of Consciousness Alert  SpO2 97 %  O2 Device Tracheostomy Collar  Patient Activity (if Appropriate) In bed  Assess: MEWS Score  MEWS Temp 0  MEWS Systolic 2  MEWS Pulse 0  MEWS RR 0  MEWS LOC 0  MEWS Score 2  MEWS Score Color Yellow  Assess: if the MEWS score is Yellow or Red  Were vital signs taken at a resting state? Yes  Focused Assessment No change from prior assessment  Early Detection of Sepsis Score *See Row Information* Low  MEWS guidelines implemented *See Row Information* No, previously yellow, continue vital signs every 4 hours  Treat  MEWS Interventions Administered scheduled meds/treatments;Administered prn meds/treatments  Pain Scale 0-10  Pain Score 8  Pain Location Back  Pain Orientation Lower  Pain Frequency Intermittent  Pain Onset On-going  Pain Intervention(s) Medication (See eMAR)  Escalate  MEWS: Escalate Yellow: discuss with charge nurse/RN and consider discussing with provider and RRT  Notify: Provider  Provider Name/Title [place]  Date Provider Notified 06/15/21  Time Provider Notified 1005  Method of Notification Page

## 2021-06-15 NOTE — Progress Notes (Addendum)
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 Given his h/o advanced stage laryngeal ca with unclear prognosis, I would recommend to follow up with Hepatology OP for HCV management.  ID available as needed, please call with questions  D/w primary team and ID pharmacy    Rosiland Oz, MD Infectious Disease Physician [facility] [address] [place], [place] [address] Phone: [phone]  Fax: [phone]

## 2021-06-15 NOTE — Progress Notes (Signed)
PT Cancellation Note  Patient Details Name: David [name] MRN: [id number] DOB: [birth year]   Cancelled Treatment:    Reason Eval/Treat Not Completed: (P) Medical issues which prohibited therapy (Defer in AM due to pt with very soft BP (75/58) and pt frustrated due to other issues. Did not have time to reattempt in PM, plan to reassess next date if hemodynamics improved.)   Carlene Coria 06/15/2021, 5:49 PM

## 2021-06-15 NOTE — Progress Notes (Addendum)
PHARMACY CONSULT NOTE FOR:  OUTPATIENT  PARENTERAL ANTIBIOTIC THERAPY (OPAT)  Indication: Bacteremia Regimen: Ceftriaxone 2g IV q24h End date: 07/06/21  IV antibiotic discharge orders are pended. To discharging provider:  please sign these orders via discharge navigator,  Select New Orders & click on the button choice - Manage This Unsigned Work.    Thank you for involving pharmacy in this patient's care.  Elita Quick, PharmD PGY1 Ambulatory Care Pharmacy Resident 06/15/2021 9:43 AM  **Pharmacist phone directory can be found on [email or web address] listed under [place]**

## 2021-06-16 DIAGNOSIS — I330 Acute and subacute infective endocarditis: Secondary | ICD-10-CM | POA: Diagnosis not present

## 2021-06-16 DIAGNOSIS — J690 Pneumonitis due to inhalation of food and vomit: Secondary | ICD-10-CM | POA: Diagnosis not present

## 2021-06-16 DIAGNOSIS — I5022 Chronic systolic (congestive) heart failure: Secondary | ICD-10-CM | POA: Diagnosis not present

## 2021-06-16 DIAGNOSIS — J9601 Acute respiratory failure with hypoxia: Secondary | ICD-10-CM | POA: Diagnosis not present

## 2021-06-16 LAB — CBC WITH DIFFERENTIAL/PLATELET
Abs Immature Granulocytes: 0.06 10*3/uL (ref 0.00–0.07)
Basophils Absolute: 0 10*3/uL (ref 0.0–0.1)
Basophils Relative: 0 %
Eosinophils Absolute: 0 10*3/uL (ref 0.0–0.5)
Eosinophils Relative: 0 %
HCT: 28 % — ABNORMAL LOW (ref 39.0–52.0)
Hemoglobin: 9.5 g/dL — ABNORMAL LOW (ref 13.0–17.0)
Immature Granulocytes: 0 %
Lymphocytes Relative: 2 %
Lymphs Abs: 0.2 10*3/uL — ABNORMAL LOW (ref 0.7–4.0)
MCH: 30.3 pg (ref 26.0–34.0)
MCHC: 33.9 g/dL (ref 30.0–36.0)
MCV: 89.2 fL (ref 80.0–100.0)
Monocytes Absolute: 1 10*3/uL (ref 0.1–1.0)
Monocytes Relative: 7 %
Neutro Abs: 12.1 10*3/uL — ABNORMAL HIGH (ref 1.7–7.7)
Neutrophils Relative %: 91 %
Platelets: 305 10*3/uL (ref 150–400)
RBC: 3.14 MIL/uL — ABNORMAL LOW (ref 4.22–5.81)
RDW: 14 % (ref 11.5–15.5)
WBC: 13.4 10*3/uL — ABNORMAL HIGH (ref 4.0–10.5)
nRBC: 0 % (ref 0.0–0.2)

## 2021-06-16 LAB — GLUCOSE, CAPILLARY
Glucose-Capillary: 171 mg/dL — ABNORMAL HIGH (ref 70–99)
Glucose-Capillary: 121 mg/dL — ABNORMAL HIGH (ref 70–99)
Glucose-Capillary: 150 mg/dL — ABNORMAL HIGH (ref 70–99)
Glucose-Capillary: 149 mg/dL — ABNORMAL HIGH (ref 70–99)
Glucose-Capillary: 123 mg/dL — ABNORMAL HIGH (ref 70–99)
Glucose-Capillary: 129 mg/dL — ABNORMAL HIGH (ref 70–99)

## 2021-06-16 MED ORDER — HYDROXYZINE HCL 25 MG PO TABS
50.0000 mg | ORAL_TABLET | Freq: Four times a day (QID) | ORAL | Status: DC | PRN
  Administered 2021-06-16 – 2021-06-27 (×24): 50 mg
  Filled 2021-06-16 (×24): qty 2

## 2021-06-16 MED ORDER — ACETAMINOPHEN 500 MG PO TABS
1000.0000 mg | ORAL_TABLET | Freq: Three times a day (TID) | ORAL | Status: DC
  Administered 2021-06-16 – 2021-07-31 (×132): 1000 mg via ORAL
  Filled 2021-06-16 (×134): qty 2

## 2021-06-16 MED ORDER — SODIUM CHLORIDE 0.9 % IV SOLN: INTRAVENOUS | Status: AC

## 2021-06-16 NOTE — Progress Notes (Signed)
Patient requesting a different type of anxiety medication. Per patient  the atarax does not work. Notified MD.

## 2021-06-16 NOTE — Progress Notes (Signed)
Patient reporting pain that is not controlled by patient's current pain medication. Notified MD.

## 2021-06-16 NOTE — Progress Notes (Signed)
Subjective:   Hospital day:10   Overnight event: none   Interim History: Seen at bedside during rounds. Reports feeling well. Understands he will have to wait until Monday to get PEG tube. Reports lidocaine patch helped with back pain. States he has high tolerance for pain and thinks he needs something stronger, like OxyContin. He is updated on the plan for today. All questions and concerns were addressed.   Objective:  Vital signs in last 24 hours: Vitals:   06/16/21 0400 06/16/21 0410 06/16/21 0429 06/16/21 0500  BP: (!) 76/59     Pulse: 66 69 67 69  Resp: '13 17 17 18  '$ Temp:      TempSrc:      SpO2: 94% 97% 95% 96%  Weight:      Height:        Filed Weights   06/08/21 0500 06/09/21 0500 06/12/21 0500  Weight: 38.2 kg 41.8 kg 42.1 kg     Intake/Output Summary (Last 24 hours) at 06/16/2021 0801 Last data filed at 06/16/2021 0700 Gross per 24 hour  Intake 540 ml  Output 900 ml  Net -360 ml   Net IO Since Admission: -1,232.05 mL [06/16/21 0801]  Recent Labs    06/15/21 2042 06/16/21 0209 06/16/21 0414  GLUCAP 137* 171* 121*     Pertinent Labs:    Latest Ref Rng & Units 06/16/2021    8:31 AM 06/15/2021    1:27 AM 06/14/2021    5:23 AM  CBC  WBC 4.0 - 10.5 K/uL 13.4   15.0   11.5    Hemoglobin 13.0 - 17.0 g/dL 9.5   9.7   9.4    Hematocrit 39.0 - 52.0 % 28.0   30.0   27.7    Platelets 150 - 400 K/uL 305   227   174         Latest Ref Rng & Units 06/15/2021    1:27 AM 06/14/2021    2:40 AM 06/13/2021    1:45 AM  CMP  Glucose 70 - 99 mg/dL 82   116   75    BUN 6 - 20 mg/dL '14   14   11    '$ Creatinine 0.61 - 1.24 mg/dL 0.43   0.45   0.46    Sodium 135 - 145 mmol/L 130   130   131    Potassium 3.5 - 5.1 mmol/L 4.5   5.4   4.7    Chloride 98 - 111 mmol/L 94   97   96    CO2 22 - 32 mmol/L '29   24   26    '$ Calcium 8.9 - 10.3 mg/dL 8.1   8.1   8.0      Imaging: No results found.  Physical Exam  General: Very cachetic and malnourished middle-age male  laying in bed. No acute distress. HEENT: Trach collar stable in place with thin secretions. Anicteric sclera.  CV: RRR. No m/r/g. No LE edema Pulmonary: On 5 L via trach collar. Lungs CTAB. Normal effort. Coarse breath sounds throughout. No wheezing or rales.  Abdominal: Decreased abdominal fat. NT/ND. Normal BS.  Extremities: Decreased bulk. 2+ distal pulses. Normal ROM. Skin: Warm and dry. No obvious rash or lesions. Neuro: A&Ox3. Moves all extremities. Normal sensation to gross touch.  Psych: Normal mood and affect   Assessment/Plan: David [name] is a 46 y.o. 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 Hospital course complicated by ICU admission requiring tracheostomy, concern for endocarditis and persistent hypotension.   Principal Problem:   Aspiration pneumonia of right lower lobe (HCC) Active Problems:   Laryngeal cancer (HCC)   Goals of care, counseling/discussion   Protein-calorie malnutrition, severe   Drug abuse and dependence ([place])   Community acquired pneumonia due to Pneumococcus ([place])   Pneumococcal bacteremia   Supraglottic airway obstruction   Chronic HFrEF (heart failure with reduced ejection fraction) ([place])   Tracheostomy status (HCC)   Acute respiratory failure with hypoxia (HCC)   Bacterial endocarditis   Endocarditis  Leukocytosis Streptococcus pneumoniae bacteremia, resolved Initial sepsis secondary to streptococcus pneumoniae treated with ceftriaxone with repeat blood cultures  negative x5 days. TTE notable for thickened anterior mitral leaflet with possible small hypermobile density concerning for vegetation. High concern  for underlying endocarditis given history of IV drug use and evidence of vegetation on TTE. Per discussion with cardiology, patient is not a candidate for TEE given the history of radiation so will treat empirically for endocarditis. Leukocytosis trending down again, 15.0-->13.4. Patient remains afebrile and hemodynamically stable.  --Continue ceftriaxone x 4 weeks (start date 5/12, date of negative blood cultures) --Trend WBC, fever curve --Pending discharge to long-term care facility after PEG placement.   Hypotension  Patient with history of SBPs in 100s prior to admission, now with SBPs in 70s-80s. Persistent hypotension likely 2/2 chronic malnutrition and critical illness. MAPS have remain above 60 and patient has been asymptomatic. SBP slightly improved to 80s-100. Patient currently at max dose of midodrine. Low threshold to give gentle IVF if MAP < 60.  --Continue midodrine to 10 mg TID --IVF to keep MAP > 60   Protein calorie malnutrition, severe Patient with history of chronic malnutrition since undergoing radiation for laryngeal carcinoma wishes to have G-tube. Patient's persistent hypotension has been a barrier to PEG tube placement. IR plan to re-evaluate for placement Monday with improvement in BP.  --IR PEG tube placement tentatively scheduled for Monday, 2/22 --Nectar thick liquids per SLP recommendation    Acute hypoxic respiratory failure Aspiration pneumonia Has completed 7 days of antibiotics (rocephin and flagyl) for aspiration pneumonia. Received tracheostomy on 5/13 for respiratory compromise while in ICU. Respiratory status much improved. Now on 5 L with 28% FiO2 via trach collar. Continues to make thin secretions.  --Last day of azithromycin today --Wean O2 as tolerated  --Routine trach care --Guaifenesin for secretions   Chronic pain  Polysubstance use disorder  Midline positional back pain Patient complaining of back pain in the setting of being hospitalized and  immobile for >1 week. --Continue methadone 50 mg daily --Schedule tylenol 1000 mg TID --PRN oxycodone, lidocaine patch  --Continue nicotine patch   Hepatitis C HCV RNA 792,000 on admission, indicative of chronic infection.  - Follow up outpatient for treatment  Diet: Dysphagia 3 IVF: None VTE: Xarelto CODE: Full  Prior to Admission Living Arrangement: Home Anticipated Discharge Location: Pending Barriers to Discharge: Medical management Dispo: Anticipated discharge in approximately 3 weeks   Signed: Lacinda Axon, MD 06/16/2021, 8:01 AM  Pager: [phone] Internal Medicine Teaching Service After 5pm on weekdays and 1pm on weekends: On Call pager: [phone]

## 2021-06-17 DIAGNOSIS — L8990 Pressure ulcer of unspecified site, unspecified stage: Secondary | ICD-10-CM | POA: Diagnosis not present

## 2021-06-17 DIAGNOSIS — J690 Pneumonitis due to inhalation of food and vomit: Secondary | ICD-10-CM | POA: Diagnosis not present

## 2021-06-17 DIAGNOSIS — J9601 Acute respiratory failure with hypoxia: Secondary | ICD-10-CM | POA: Diagnosis not present

## 2021-06-17 DIAGNOSIS — I330 Acute and subacute infective endocarditis: Secondary | ICD-10-CM | POA: Diagnosis not present

## 2021-06-17 DIAGNOSIS — Z7189 Other specified counseling: Secondary | ICD-10-CM | POA: Diagnosis not present

## 2021-06-17 LAB — BASIC METABOLIC PANEL
Anion gap: 5 (ref 5–15)
BUN: 11 mg/dL (ref 6–20)
CO2: 31 mmol/L (ref 22–32)
Calcium: 8 mg/dL — ABNORMAL LOW (ref 8.9–10.3)
Chloride: 98 mmol/L (ref 98–111)
Creatinine, Ser: 0.46 mg/dL — ABNORMAL LOW (ref 0.61–1.24)
GFR, Estimated: >60 mL/min (ref >60)
Glucose, Bld: 110 mg/dL — ABNORMAL HIGH (ref 70–99)
Potassium: 4.4 mmol/L (ref 3.5–5.1)
Sodium: 134 mmol/L — ABNORMAL LOW (ref 135–145)

## 2021-06-17 LAB — GLUCOSE, CAPILLARY
Glucose-Capillary: 109 mg/dL — ABNORMAL HIGH (ref 70–99)
Glucose-Capillary: 112 mg/dL — ABNORMAL HIGH (ref 70–99)
Glucose-Capillary: 136 mg/dL — ABNORMAL HIGH (ref 70–99)
Glucose-Capillary: 139 mg/dL — ABNORMAL HIGH (ref 70–99)
Glucose-Capillary: 64 mg/dL — ABNORMAL LOW (ref 70–99)
Glucose-Capillary: 94 mg/dL (ref 70–99)
Glucose-Capillary: 99 mg/dL (ref 70–99)

## 2021-06-17 LAB — CBC
HCT: 27.5 % — ABNORMAL LOW (ref 39.0–52.0)
Hemoglobin: 8.8 g/dL — ABNORMAL LOW (ref 13.0–17.0)
MCH: 29.4 pg (ref 26.0–34.0)
MCHC: 32 g/dL (ref 30.0–36.0)
MCV: 92 fL (ref 80.0–100.0)
Platelets: 263 10*3/uL (ref 150–400)
RBC: 2.99 MIL/uL — ABNORMAL LOW (ref 4.22–5.81)
RDW: 14 % (ref 11.5–15.5)
WBC: 12.4 10*3/uL — ABNORMAL HIGH (ref 4.0–10.5)
nRBC: 0 % (ref 0.0–0.2)

## 2021-06-17 LAB — MAGNESIUM: Magnesium: 2 mg/dL (ref 1.7–2.4)

## 2021-06-17 LAB — PHOSPHORUS: Phosphorus: 3.3 mg/dL (ref 2.5–4.6)

## 2021-06-17 MED ORDER — DULOXETINE HCL 30 MG PO CPEP
30.0000 mg | ORAL_CAPSULE | Freq: Every day | ORAL | Status: DC
  Administered 2021-06-17 – 2021-06-24 (×8): 30 mg via ORAL
  Filled 2021-06-17 (×8): qty 1

## 2021-06-17 MED ORDER — SERTRALINE HCL 25 MG PO TABS: 25.0000 mg | ORAL_TABLET | Freq: Every day | ORAL | Status: DC

## 2021-06-17 MED ORDER — SODIUM CHLORIDE 0.9 % IV SOLN: INTRAVENOUS | Status: AC

## 2021-06-17 MED ORDER — SODIUM BICARBONATE 650 MG PO TABS
650.0000 mg | ORAL_TABLET | Freq: Once | ORAL | Status: AC
  Administered 2021-06-17: 650 mg
  Filled 2021-06-17: qty 1

## 2021-06-17 MED ORDER — PANCRELIPASE (LIP-PROT-AMYL) 10440-39150 UNITS PO TABS
20880.0000 [IU] | ORAL_TABLET | Freq: Once | ORAL | Status: AC
  Administered 2021-06-17: 20880 [IU]
  Filled 2021-06-17 (×2): qty 2

## 2021-06-17 NOTE — Progress Notes (Signed)
Patient's peg tube has become clogged try unclogging the tube without success. MD is aware. OK to give Medications crushed with applesauce

## 2021-06-17 NOTE — Progress Notes (Signed)
   06/17/21 0000  Assess: MEWS Score  BP (!) 82/58  Pulse Rate 69  ECG Heart Rate 70  Resp 13  SpO2 100 %  O2 Device Tracheostomy Collar  O2 Flow Rate (L/min) 5 L/min  Assess: MEWS Score  MEWS Temp 0  MEWS Systolic 1  MEWS Pulse 0  MEWS RR 1  MEWS LOC 0  MEWS Score 2  MEWS Score Color Yellow  Assess: if the MEWS score is Yellow or Red  Were vital signs taken at a resting state? Yes  Focused Assessment No change from prior assessment  Early Detection of Sepsis Score *See Row Information* Low  MEWS guidelines implemented *See Row Information* No, previously yellow, continue vital signs every 4 hours  Treat  MEWS Interventions Other (Comment)  Pain Scale 0-10  Pain Score Asleep  Take Vital Signs  Increase Vital Sign Frequency  Yellow: Q 2hr X 2 then Q 4hr X 2, if remains yellow, continue Q 4hrs  Escalate  MEWS: Escalate Yellow: discuss with charge nurse/RN and consider discussing with provider and RRT  Notify: Charge Nurse/RN  Name of Charge Nurse/RN Notified Nikki RN  Date Charge Nurse/RN Notified 06/17/21  Time Charge Nurse/RN Notified 0010  Document  Progress note created (see row info) Yes   Patient's MEW's flagged yellow in relation to patient's blood pressure. Patient's MAP > 60-65 and blood pressure very similar to other obtained blood pressures throughout the day. Patient asymptomatic. MD's aware. Patient receiving scheduled Midodrine.

## 2021-06-17 NOTE — Progress Notes (Signed)
Subjective:   Hospital day:11  Overnight event: none   Interim History: Patient evaluated at the bedside laying in bed. Patient frustrated about not getting his lidocaine patch. Patient also states he continues to be in pain and wants more pain meds. States he continues to be anxious. Patient tearful while discussing his current circumstances. Thinks he made a mistake agreeing to the trach. He wants to get out of here once he gets his peg tube. We empathized with him and discussed our plans to modified his current pain and anxiety medications.   Objective:  Vital signs in last 24 hours: Vitals:   06/17/21 0400 06/17/21 0805 06/17/21 0816 06/17/21 1151  BP: (!) 86/55 (!) 81/59  (!) 74/59  Pulse: 71 72 76 65  Resp: '18 14 16 11  '$ Temp: 98.3 F (36.8 C) 98 F (36.7 C)  97.9 F (36.6 C)  TempSrc: Oral Oral  Oral  SpO2: 100% 99% 99% 98%  Weight:      Height:        Filed Weights   06/08/21 0500 06/09/21 0500 06/12/21 0500  Weight: 38.2 kg 41.8 kg 42.1 kg     Intake/Output Summary (Last 24 hours) at 06/17/2021 1136 Last data filed at 06/17/2021 0806 Gross per 24 hour  Intake 2451.98 ml  Output 1625 ml  Net 826.98 ml   Net IO Since Admission: -305.07 mL [06/17/21 1136]  Recent Labs    06/16/21 2037 06/17/21 0458 06/17/21 0803  GLUCAP 129* 112* 109*     Pertinent Labs:    Latest Ref Rng & Units 06/17/2021    1:08 AM 06/16/2021    8:31 AM 06/15/2021    1:27 AM  CBC  WBC 4.0 - 10.5 K/uL 12.4   13.4   15.0    Hemoglobin 13.0 - 17.0 g/dL 8.8   9.5   9.7    Hematocrit 39.0 - 52.0 % 27.5   28.0   30.0    Platelets 150 - 400 K/uL 263   305   227         Latest Ref Rng & Units 06/17/2021    1:08 AM 06/15/2021    1:27 AM 06/14/2021    2:40 AM  CMP  Glucose 70 - 99 mg/dL 110   82   116    BUN 6 - 20 mg/dL '11   14   14    '$ Creatinine 0.61 - 1.24 mg/dL 0.46   0.43   0.45    Sodium 135 - 145 mmol/L 134   130   130    Potassium 3.5 - 5.1 mmol/L 4.4   4.5   5.4    Chloride  98 - 111 mmol/L 98   94   97    CO2 22 - 32 mmol/L '31   29   24    '$ Calcium 8.9 - 10.3 mg/dL 8.0   8.1   8.1      Imaging: No results found.  Physical Exam  General: Very cachetic and malnourished middle-age male laying in bed. No acute distress. HEENT: Trach collar stable in place with thin secretions. Anicteric sclera.  CV: RRR. No m/r/g. No LE edema Pulmonary: On 5 L via trach collar. Lungs CTAB. Normal effort. Coarse breath sounds throughout. No wheezing or rales.  Abdominal: Decreased abdominal fat. NT/ND. Normal BS.  Extremities: Decreased bulk. 2+ distal pulses. Normal ROM. Skin: Warm and dry. Mild sacral bruise covered with dressing.  Neuro: A&Ox3. Moves all extremities. Normal  sensation to gross touch.  Psych: Tearful mood.    Assessment/Plan: David [name] is a 46 y.o. 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 Hospital course complicated by ICU admission requiring tracheostomy, concern for endocarditis and persistent hypotension.   Principal Problem:   Aspiration pneumonia of right lower lobe (HCC) Active Problems:   Laryngeal cancer (HCC)   Goals of care, counseling/discussion   Protein-calorie malnutrition, severe   Drug abuse and dependence ([place])   Community acquired pneumonia due to Pneumococcus [facility]: [facility])   Anxiety state   Pneumococcal bacteremia   Supraglottic airway obstruction   Chronic HFrEF (heart failure with reduced ejection fraction) (HCC)   Tracheostomy status (HCC)   Acute respiratory failure with hypoxia (HCC)   Bacterial endocarditis   Pressure injury of skin   Endocarditis  Leukocytosis Streptococcus pneumoniae  bacteremia, resolved Initial sepsis secondary to streptococcus pneumoniae treated with ceftriaxone with repeat blood cultures  negative x5 days. TTE notable for thickened anterior mitral leaflet with possible small hypermobile density concerning for vegetation. High concern for underlying endocarditis given history of IV drug use and evidence of vegetation on TTE. Per discussion with cardiology, patient is not a candidate for TEE given the history of radiation so will treat empirically for endocarditis. Leukocytosis continues to trend down. He remains afebrile.  --Continue ceftriaxone x 4 weeks (start date 5/12, date of negative blood cultures) --Trend WBC, fever curve --Pending discharge to long-term care facility after PEG placement.   Hypotension  Patient with history of SBPs in 100s prior to admission, now with SBPs in 70s-80s. SBP slightly improved to 110 with gentle fluid hydration but back down to 80s and 70s overnight. Will initiate maintenance fluids once patient is NPO at midnight.  --IVF 100 cc/hr x 10 hrs starting midnight --Continue midodrine to 10 mg TID --IVF to keep MAP > 60   Protein calorie malnutrition, severe Patient with history of chronic malnutrition since undergoing radiation for laryngeal carcinoma wishes to have G-tube. Patient's persistent hypotension has been a barrier to PEG tube placement. IR plan to re-evaluate for placement Monday with improvement in BP.  --IR PEG tube placement tentatively scheduled for Monday, 2/22 --Nectar thick liquids per SLP recommendation    Acute hypoxic respiratory failure Aspiration pneumonia Has completed 7 days of antibiotics (rocephin and flagyl) for aspiration pneumonia. Received tracheostomy on 5/13 for respiratory compromise while in ICU. Continues to make copious secretions. Respiratory status stable at 5 LNC w/ 28% FIO2.  --Wean O2 as tolerated  --Routine trach care --Guaifenesin for secretions   Chronic pain  Polysubstance  use disorder  Midline positional back pain Continues to have back pain even with the current pain regimen. States he needs something stronger since he has develop tolerance from many years of IV drug use. His soft Bp limits the amount of opiates and benzos he can get.  --Continue methadone 50 mg daily --Schedule tylenol 1000 mg TID --PRN oxycodone, lidocaine patch  --Continue nicotine patch --Start Duloxetine  Anxiety and Depression Goals of Care Patient frustrated with his hospital course. Very tearful and emotional today. States he feels anxious and his back is not well-controlled. Thinks he might have made a huge mistake agreeing to get a trach not knowing he might  have it for the rest of his life.  --Start duloxetine 30 mg daily, can titrate up to 60 daily --Continue hydroxyzine 50 mg prn q6hr --Re-consult palliative care team  Hepatitis C HCV RNA 792,000 on admission, indicative of chronic infection.  - Follow up outpatient for treatment  Diet: Dysphagia 3 IVF: None VTE: Xarelto CODE: Full  Prior to Admission Living Arrangement: Home Anticipated Discharge Location: Pending Barriers to Discharge: Medical management Dispo: Anticipated discharge in approximately 3 weeks   Signed: Lacinda Axon, MD 06/17/2021, 11:36 AM  Pager: [phone] Internal Medicine Teaching Service After 5pm on weekdays and 1pm on weekends: On Call pager: [phone]

## 2021-06-17 NOTE — TOC Progression Note (Signed)
Transition of Care [facility]) - Progression Note    Patient Details  Name: David [name] MRN: [id number] Date of Birth: [birth year]  Transition of Care [facility]) CM/SW Contact  Carles Collet, RN Phone Number: 06/17/2021, 2:43 PM  Clinical Narrative:   LVM with [name], [place] trach support, to assess if patient would qualify for home nursing support. [phone]    Expected Discharge Plan: [place] Barriers to Discharge: Continued Medical Work up  Expected Discharge Plan and Services Expected Discharge Plan: [place]                                               Social Determinants of Health (SDOH) Interventions    Readmission Risk Interventions     View : No data to display.

## 2021-06-18 ENCOUNTER — Encounter (HOSPITAL_COMMUNITY): Payer: Self-pay | Admitting: Internal Medicine

## 2021-06-18 ENCOUNTER — Inpatient Hospital Stay (HOSPITAL_COMMUNITY): Payer: Medicaid Other

## 2021-06-18 DIAGNOSIS — C329 Malignant neoplasm of larynx, unspecified: Secondary | ICD-10-CM

## 2021-06-18 DIAGNOSIS — J9601 Acute respiratory failure with hypoxia: Secondary | ICD-10-CM | POA: Diagnosis not present

## 2021-06-18 DIAGNOSIS — I330 Acute and subacute infective endocarditis: Secondary | ICD-10-CM

## 2021-06-18 DIAGNOSIS — F1920 Other psychoactive substance dependence, uncomplicated: Secondary | ICD-10-CM

## 2021-06-18 DIAGNOSIS — F411 Generalized anxiety disorder: Secondary | ICD-10-CM

## 2021-06-18 DIAGNOSIS — J690 Pneumonitis due to inhalation of food and vomit: Secondary | ICD-10-CM | POA: Diagnosis not present

## 2021-06-18 DIAGNOSIS — Z930 Tracheostomy status: Secondary | ICD-10-CM

## 2021-06-18 DIAGNOSIS — I5022 Chronic systolic (congestive) heart failure: Secondary | ICD-10-CM

## 2021-06-18 HISTORY — PX: IR GASTROSTOMY TUBE MOD SED: IMG625

## 2021-06-18 LAB — CBC
HCT: 28.6 % — ABNORMAL LOW (ref 39.0–52.0)
Hemoglobin: 9.2 g/dL — ABNORMAL LOW (ref 13.0–17.0)
MCH: 29.9 pg (ref 26.0–34.0)
MCHC: 32.2 g/dL (ref 30.0–36.0)
MCV: 92.9 fL (ref 80.0–100.0)
Platelets: 258 10*3/uL (ref 150–400)
RBC: 3.08 MIL/uL — ABNORMAL LOW (ref 4.22–5.81)
RDW: 14.4 % (ref 11.5–15.5)
WBC: 12.8 10*3/uL — ABNORMAL HIGH (ref 4.0–10.5)
nRBC: 0 % (ref 0.0–0.2)

## 2021-06-18 LAB — GLUCOSE, CAPILLARY
Glucose-Capillary: 62 mg/dL — ABNORMAL LOW (ref 70–99)
Glucose-Capillary: 140 mg/dL — ABNORMAL HIGH (ref 70–99)
Glucose-Capillary: 85 mg/dL (ref 70–99)
Glucose-Capillary: 159 mg/dL — ABNORMAL HIGH (ref 70–99)
Glucose-Capillary: 134 mg/dL — ABNORMAL HIGH (ref 70–99)
Glucose-Capillary: 108 mg/dL — ABNORMAL HIGH (ref 70–99)

## 2021-06-18 MED ORDER — MIDAZOLAM HCL 2 MG/2ML IJ SOLN
INTRAMUSCULAR | Status: AC | PRN
  Administered 2021-06-18 (×2): .5 mg via INTRAVENOUS
  Administered 2021-06-18: 1 mg via INTRAVENOUS

## 2021-06-18 MED ORDER — CEFAZOLIN SODIUM-DEXTROSE 2-4 GM/100ML-% IV SOLN: 2.0000 g | INTRAVENOUS | Status: AC

## 2021-06-18 MED ORDER — MIDAZOLAM HCL 2 MG/2ML IJ SOLN
INTRAMUSCULAR | Status: AC
  Filled 2021-06-18: qty 2

## 2021-06-18 MED ORDER — LIDOCAINE HCL 1 % IJ SOLN
INTRAMUSCULAR | Status: AC
Start: 2021-06-18 — End: 2021-06-18
  Filled 2021-06-18: qty 20

## 2021-06-18 MED ORDER — FENTANYL CITRATE (PF) 100 MCG/2ML IJ SOLN
INTRAMUSCULAR | Status: AC | PRN
  Administered 2021-06-18 (×4): 25 ug via INTRAVENOUS

## 2021-06-18 MED ORDER — ENOXAPARIN SODIUM 40 MG/0.4ML IJ SOSY
40.0000 mg | PREFILLED_SYRINGE | INTRAMUSCULAR | Status: DC
  Administered 2021-06-19: 40 mg via SUBCUTANEOUS
  Filled 2021-06-18: qty 0.4

## 2021-06-18 MED ORDER — CEFAZOLIN SODIUM-DEXTROSE 2-4 GM/100ML-% IV SOLN
INTRAVENOUS | Status: AC
  Administered 2021-06-18: 2 g via INTRAVENOUS
  Filled 2021-06-18: qty 100

## 2021-06-18 MED ORDER — GLUCAGON HCL RDNA (DIAGNOSTIC) 1 MG IJ SOLR
INTRAMUSCULAR | Status: AC
  Filled 2021-06-18: qty 1

## 2021-06-18 MED ORDER — FENTANYL CITRATE (PF) 100 MCG/2ML IJ SOLN
INTRAMUSCULAR | Status: AC
  Filled 2021-06-18: qty 2

## 2021-06-18 MED ORDER — DEXTROSE 50 % IV SOLN
12.5000 g | INTRAVENOUS | Status: AC
  Administered 2021-06-18: 12.5 g via INTRAVENOUS
  Filled 2021-06-18: qty 50

## 2021-06-18 MED ORDER — GLUCAGON HCL (RDNA) 1 MG IJ SOLR
INTRAMUSCULAR | Status: AC | PRN
  Administered 2021-06-18: 1 mg via INTRAVENOUS

## 2021-06-18 NOTE — Sedation Documentation (Signed)
Cortrak pulled back into stomach per Dr Dwaine Gale. Imaging confirmed placement in the stomach

## 2021-06-18 NOTE — Progress Notes (Signed)
Patient refused to sign consent at this time

## 2021-06-18 NOTE — Progress Notes (Signed)
This RN notified by CNA that patient wanted NG tube removed because he was eating and didn't feel like he needed it any longer. Upon checking on patient, RN noted that NG tube had been partially removed. Patient stated that he no longer wanted it in. Informed patient that he risked aspirating as tube feeds were going. Tube feeds stopped and MD notified.  2030- MD ordered to completely remove NG tube. Tube removed and documented.

## 2021-06-18 NOTE — Sedation Documentation (Signed)
Ct at this time

## 2021-06-18 NOTE — Hospital Course (Addendum)
Follow-up issues:  Antifungal regimen: Micafungin in-house Fluconazole 800 mg p.o. daily through 09/23/2021 Fluconazole 400 mg p.o. daily through 03/26/2022 Fluconazole 200 mg p.o. daily indefinitely for prophylaxis  Ophtho follow up PET scan?  David [name] is a 46 y.o. male with a history of stage IVB laryngeal SCC s/p chemoradiation in remission, dysphagia, HFrEF, severe malnutrition, polysubstance use disorder, and anxiety who presented on 5/10 with worsening oral intake and labored breathing, found to have strep pneumo bacteremia. Hospital course is outlined by problem below.   ED Course Initially found to be afebrile, tachycardic, and tachypneic with O2 saturation of 70%.Labs notable for leukocytosis to 23.4, lactic acid 8.4, and creatinine 1.73 with (baseline 0.70). ABG with pH 7.368, pO2 89, pCO2 54.9. UA consistent with dehydration. CXR notable for right sided multilobar pneumonia with parapneumonic effusion. He was placed on 15L O2 via Venturi mask and treated per sepsis protocol. He received 2.5L bolus IVF and was subsequently started on mIVF. Blood cultures were obtained x2 and antibiotic coverage was initiated with unasyn and doxycycline per pharmacy recommendations. PCCM consulted for urgent intubation but patient expressed DNR/DNI request, therefore was not intubated.   Acute hypoxic respiratory failure with sepsis secondary to aspiration pneumonia Tracheostomy dependent History of SCC of larynx  Initial blood cultures grew Strep pneumo. He rescinded his DNI/DNR request and was transferred to the ICU on BiPAP on 5/11. ENT was consulted for respiratory failure. He continued to desaturate into the 70s with bradycardia on full BiPAP support. ENT performed urgent tracheostomy. He was transferred back to the floor on 5/16. He completed a 5-day course of ceftriaxone and Flagyl on 5/21 for aspiration pneumonia. ENT switched him to a cuffless trach on 5/30. He underwent routine trach  care throughout admission with downsize on 06/21. He had new pain around his tracheostomy site and R anterior neck prompting rescan of head and neck on 06/27 which did not show obvious progression of disease. Oncology team reviewed new scan and recommended outpatient follow-up as plan. Pain and secretions were managed throughout stay with as needed medications as appropriate.   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   Strep pneumo bacteremia  Endocarditis  TTE notable for thickened anterior mitral leaflet with possible small hypermobile density concerning for vegetation. High concern for underlying endocarditis given history of IV drug use and TTE findings. Per Cardiology, patient not a candidate for TEE given his history of radiation. Infectious Disease was consulted, recommended empirically treating for endocarditis with IV ceftriaxone, planned for 4-week course starting at date of negative blood cultures. Cultures were negative on 5/12, patient finished IV ceftriaxone on 6/9. ***    Chronic hypotension  Patient remained persistently hypotensive during his stay with SBPs 80-90s. He was started on midodrine 10 mg TID.   Protein calorie malnutrition, severe  Cortrak was placed. VIR consulted for PEG tube placement once patient was stable, but were unable to place PEG-tube due to patient's anatomy. General Surgery placed PEG-tube on 5/24. Patient was switched to bolus feeds on 5/31. Patient gained 19*** lbs over the course of his stay.   Polysubstance use disorder  Patient was continued on methadone 50 mg daily and started on nicotine patch.   Anxiety Depression  Insomnia Patient received duloxetine 60 mg, hydroxyzine 100 mg q6h PRN, and trazodone 100 mg qhs.   Chronic hepatitis C infection  HCV RNA load 792,000 on admission, indicative of chronic infection. ID recommended outpatient treatment.  Goals of Care Patient initially expressed  desire to be DNR/DNI in the ED. Palliative care team was consulted for assistance, patient then changed his code status to full code stating that he wanted to do everything possible to prolong his life and to get better and go home.

## 2021-06-18 NOTE — Progress Notes (Addendum)
Hospital day: 12  Subjective:   Patient Summary: David [name] is a 46 y.o. 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   Overnight events: NG tube clogged. Glucose low at 62, received 12.5 g of IV dextrose, BG rechecked and improved to 140.   Patient being taken down for PEG tube placement by VIR during morning rounds. He was seen later this afternoon at bedside. When asked about his mood, he states that he intends to keep fighting although he is tired, and that he wants to get better and go home. He is still unhappy with the trach and says that he got it to appease his mom.   Objective:  Vital signs in last 24 hours: Vitals:   06/17/21 2344 06/18/21 0341 06/18/21 0400 06/18/21 0727  BP: (!) 76/54  (!) 80/52   Pulse: 62 63 65 67  Resp: '15 14 14 17  '$ Temp: 97.6 F (36.4 C)  97.8 F (36.6 C)   TempSrc: Axillary  Oral   SpO2: 100% 100% 98% 98%  Weight:      Height:        Filed Weights   06/08/21 0500 06/09/21 0500 06/12/21 0500  Weight: 38.2 kg 41.8 kg 42.1 kg     Intake/Output Summary (Last 24 hours) at 06/18/2021 0735 Last data filed at 06/18/2021 0247 Gross per 24 hour  Intake 100 ml  Output 576 ml  Net -476 ml   Net IO Since Admission: -656.07 mL [06/18/21 0735]   Pertinent Labs:    Latest Ref Rng & Units 06/17/2021    1:08 AM 06/16/2021    8:31 AM 06/15/2021    1:27 AM  CBC  WBC 4.0 - 10.5 K/uL 12.4   13.4   15.0    Hemoglobin 13.0 - 17.0 g/dL 8.8   9.5   9.7    Hematocrit 39.0 - 52.0 % 27.5   28.0   30.0    Platelets 150 - 400 K/uL 263   305   227         Latest Ref Rng & Units 06/17/2021    1:08 AM 06/15/2021    1:27 AM 06/14/2021    2:40 AM  CMP  Glucose 70 - 99 mg/dL 110   82    116    BUN 6 - 20 mg/dL '11   14   14    '$ Creatinine 0.61 - 1.24 mg/dL 0.46   0.43   0.45    Sodium 135 - 145 mmol/L 134   130   130    Potassium 3.5 - 5.1 mmol/L 4.4   4.5   5.4    Chloride 98 - 111 mmol/L 98   94   97    CO2 22 - 32 mmol/L '31   29   24    '$ Calcium 8.9 - 10.3 mg/dL 8.0   8.1   8.1      Recent Labs    06/17/21 2346 06/18/21 0449 06/18/21 0638  GLUCAP 99 62* 140*     Imaging: No results found.  Physical Exam  General: Cachectic male, resting in bed Head: Normocephalic, atraumatic CV: RRR, no murmurs heard Pulmonary: Trach collar present, breathing comfortably on FiO2 28% Abdominal: Soft, non-tender Extremities: No peripheral  edema Skin: No rashes seen Neuro: A&O x3, no focal deficits noted Psych: Appears depressed, although more hopeful today with stated goals of getting better and going home   Assessment/Plan: David [name] is a 46 y.o. 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 Hospital course has been complicated by ICU admission requiring tracheostomy as well as concern for endocarditis and persistent hypotension.   Principal Problem:   Aspiration pneumonia of right lower lobe ([place]) Active Problems:   Laryngeal cancer (HCC)   Goals of care, counseling/discussion   Protein-calorie malnutrition, severe   Drug abuse and dependence ([place])   Community acquired pneumonia due to Pneumococcus [facility])   Anxiety state   Pneumococcal bacteremia   Supraglottic airway obstruction   Chronic HFrEF (heart failure with reduced ejection fraction) ([place])   Tracheostomy status (HCC)   Acute respiratory failure with hypoxia (HCC)   Bacterial endocarditis   Pressure injury  of skin  # Endocarditis  # Leukocytosis, improving # Strep pneumo bacteremia, resolved  Initial sepsis secondary to streptococcus pneumoniae bacteremia treated with ceftriaxone with repeat blood cultures negative on 06/08/21. TTE notable for thickened anterior mitral leaflet with possible small hypermobile density concerning for vegetation. High concern for underlying endocarditis given history of IV drug use and evidence of vegetation on TTE. Per Cardiology, patient not a candidate for TEE given his history of radiation. Initiated empiric treatment for endocarditis with ceftriaxone, planned for 4-week course starting at date of negative blood cultures (5/12). Leukocytosis stable with WBC 12 today. He remains afebrile.  - Continue ceftriaxone x 4 weeks (end date 07/06/21) - Trend WBC, fever curve - Pending discharge to long-term care facility after PEG placement  # Hypotension  SBPs 80-100s today. Will continue to closely monitor.  - IVF to keep MAP > 60 - Midodrine 10 mg TID  # Protein calorie malnutrition, severe  VIR unable to find safe window for PEG tube placement today. General Surgery consulted, will need 48 hours wash-out since patient received Xarelto today for VTE prophylaxis, planning on procedure for Wednesday. Will continue NG tube feeds at this time.  - Stop Xarelto - Start Lovenox  - Gen Surg consulted, appreciate assistance  - Nectar thick liquids per SLP recs - CBG q4 hours   # Acute hypoxic respiratory failure  # Aspiration pneumonia  Completed 7 days of antibiotics (rocephin and flagyl) for aspiration pneumonia. Tracheostomy placed on 5/13 for respiratory compromise while in ICU. Respiratory status stable on TC with FiO2 28%.  - Wean O2 as tolerated  - Routine trach care - Guaifenesin 5 mL q6h PRN secretions  - Continuous pulse ox monitoring   # Chronic pain  # Polysubstance use disorder  Pain management with opioids and benzodiazepines limited by persistent  hypotension. - Methadone 50 mg daily  - Tylenol 1000 mg TID  - Exedrin tablet q6h PRN headache  - Oxycodone 5 mg q6h PRN mod/severe pain  - Lidocaine patch daily    - Nicotine patch daily - Duloxetine 30 mg daily   # Anxiety, Depression  # Goals of Care Duloxetine 30 mg started on 5/21 for mood and chronic pain. Re-consulted Palliative Care today for discussion regarding goals of care.  - Hydroxyzine 50 mg PRN q6h - Palliative Care consulted, appreciate  assistance  - Duloxetine 30 mg daily, can titrate up to 60 mg daily   # Hepatitis C  HCV RNA 792,000 on admission, indicative of chronic infection. ID recommended outpatient treatment.  Diet: Dysphagia 3 IVF: None VTE: Lovenox (switched from Xarelto due to anticipated procedure) CODE: Full  Prior to Admission Living Arrangement: Home Anticipated Discharge Location: Pending, likely long-term care facility  Barriers to Discharge: Medical treatment  Dispo: Anticipated discharge in approx 3 weeks after IV antibiotic course finishes   Signed: Sabino Dick, MS4 06/18/2021, 7:35 AM  Internal Medicine Teaching Service [phone] Please contact the on call pager after 5 pm and on weekends at [phone].

## 2021-06-18 NOTE — H&P (View-Only) (Signed)
David [name] 1976-01-28  [id number].    Requesting MD: Dr. 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  There is some concerned for endocarditis but due to is h/o radiation and chemotherapy, a TEE can not be performed.  During his stay he required a tracheostomy tube to be placed due to respiratory compromise.  He is currently on trach collar.  He underwent attempt at percutaneous g-tube placement by IR today, but the procedure was aborted due to his anatomy.  We have been asked to see him for evaluation of an open g-tube placement.  ROS: ROS: Please see HPI  History reviewed. No pertinent family history.  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[place])    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 ([place])    Pneumothorax    Left lung spontaneous pneumothorax at age 71 yr     Past Surgical History:  Procedure Laterality Date   chest tubes Left    TRACHEOSTOMY TUBE PLACEMENT N/A 06/09/2021   Procedure: AWAKE TRACHEOSTOMY;  Surgeon: Melida Quitter, MD;  Location: [facility] OR;  Service: ENT;  Laterality: N/A;    Social History:  reports that he has been smoking cigarettes. He has been smoking an average of 3 packs per day. He has never used smokeless tobacco. He reports that he does not currently use alcohol after a past usage of about 12.0 standard drinks per week. He reports that he does not currently use drugs after having used the following drugs: IV.  Allergies: No Known Allergies  Medications Prior to Admission   Medication Sig Dispense Refill   carvedilol (COREG) 6.25 MG tablet Take 1 tablet (6.25 mg total) by mouth 2 (two) times daily. 60 tablet 11   clotrimazole (MYCELEX) 10 MG troche Take 1 tablet (10 mg total) by mouth 5 (five) times daily. Suck on these slowly. 70 Troche 0   guaiFENesin (MUCINEX) 600 MG 12 hr tablet Take 2 tablets (1,200 mg total) by mouth 2 (two) times daily. (Patient taking differently: Take 1,200 mg by mouth 2 (two) times daily as needed for cough or to loosen phlegm.) 60 tablet 1   lidocaine (XYLOCAINE) 2 % solution Patient: Mix 1part 2% viscous lidocaine, 1part H20. Swish/swallow 22m of diluted mixture, 355m before meals and '@bedtime'$ , up to QID prn soreness 200 mL 2   methadone (DOLOPHINE) 10 MG/ML solution Take 6 mLs (60 mg total) by mouth daily. (Patient taking differently: Take 50 mg by mouth daily.) 90 mL 0   ondansetron (ZOFRAN) 8 MG tablet Take 1 tablet (8 mg total) by mouth every 8 (eight) hours as needed for nausea or vomiting. Starting day 3 after chemotherapy 30 tablet 2   amoxicillin-clavulanate (AUGMENTIN) 875-125 MG tablet Take 1 tablet by mouth every 12 (twelve) hours. (Patient not taking: Reported on 06/06/2021) 14 tablet 0   dronabinol (MARINOL) 2.5 MG capsule Take 2.5 mg by mouth 2 (two) times daily before a meal. (Patient not taking: Reported on 03/31/2021)     nicotine (NICODERM CQ - DOSED IN MG/24 HOURS) 14 mg/24hr patch  Place 1 patch (14 mg total) onto the skin daily. Apply 21 mg patch daily x 6 wk, then '14mg'$  patch daily x 2 wk, then 7 mg patch daily x 2 wk (Patient not taking: Reported on 06/06/2021) 14 patch 0   sacubitril-valsartan (ENTRESTO) 49-51 MG Take 1 tablet by mouth 2 (two) times daily. (Patient not taking: Reported on 06/06/2021) 60 tablet 2   traMADol (ULTRAM) 50 MG tablet Take 1 tablet (50 mg total) by mouth 2 (two) times daily. (Patient not taking: Reported on 06/06/2021) 60 tablet 0     Physical Exam: Blood pressure (!) 88/67, pulse 66,  temperature 98.1 F (36.7 C), temperature source Oral, resp. rate 18, height '5\' 11"'$  (1.803 m), weight 42.1 kg, SpO2 98 %. General: pleasant, WD, cachectic male who is laying in bed in NAD HEENT: head is normocephalic, atraumatic.  Sclera are noninjected. Ears and nose without any masses or lesions.  Mouth is pink and dry Heart: regular, rate, and rhythm.   Lungs: Trach present.  Respiratory effort nonlabored Abd: soft, NT, ND, +BS, no masses, hernias, or organomegaly MS: all 4 extremities are symmetrical with no cyanosis, clubbing, or edema. Skin: warm and dry with no masses, lesions, or rashes Psych: A&Ox3 with an angry affect.   Results for orders placed or performed during the hospital encounter of 06/06/21 (from the past 48 hour(s))  Glucose, capillary     Status: Abnormal   Collection Time: 06/16/21 12:32 PM  Result Value Ref Range   Glucose-Capillary 149 (H) 70 - 99 mg/dL    Comment: Glucose reference range applies only to samples taken after fasting for at least 8 hours.  Glucose, capillary     Status: Abnormal   Collection Time: 06/16/21  3:51 PM  Result Value Ref Range   Glucose-Capillary 123 (H) 70 - 99 mg/dL    Comment: Glucose reference range applies only to samples taken after fasting for at least 8 hours.  Glucose, capillary     Status: Abnormal   Collection Time: 06/16/21  8:37 PM  Result Value Ref Range   Glucose-Capillary 129 (H) 70 - 99 mg/dL    Comment: Glucose reference range applies only to samples taken after fasting for at least 8 hours.  CBC     Status: Abnormal   Collection Time: 06/17/21  1:08 AM  Result Value Ref Range   WBC 12.4 (H) 4.0 - 10.5 K/uL   RBC 2.99 (L) 4.22 - 5.81 MIL/uL   Hemoglobin 8.8 (L) 13.0 - 17.0 g/dL   HCT 27.5 (L) 39.0 - 52.0 %   MCV 92.0 80.0 - 100.0 fL   MCH 29.4 26.0 - 34.0 pg   MCHC 32.0 30.0 - 36.0 g/dL   RDW 14.0 11.5 - 15.5 %   Platelets 263 150 - 400 K/uL   nRBC 0.0 0.0 - 0.2 %    Comment: Performed at [facility], [place] [address] [place], [place] [address]  Basic metabolic panel     Status: Abnormal   Collection Time: 06/17/21  1:08 AM  Result Value Ref Range   Sodium 134 (L) 135 - 145 mmol/L   Potassium 4.4 3.5 - 5.1 mmol/L   Chloride 98 98 - 111 mmol/L   CO2 31 22 - 32 mmol/L   Glucose, Bld 110 (H) 70 - 99 mg/dL    Comment: Glucose reference range applies only to samples taken after fasting for at least 8 hours.   BUN 11 6 - 20 mg/dL  Creatinine, Ser 0.46 (L) 0.61 - 1.24 mg/dL   Calcium 8.0 (L) 8.9 - 10.3 mg/dL   GFR, Estimated >60 >60 mL/min    Comment: (NOTE) Calculated using the CKD-EPI Creatinine Equation (2021)    Anion gap 5 5 - 15    Comment: Performed at [facility] [address] [place], [place] [address]  Magnesium     Status: None   Collection Time: 06/17/21  1:08 AM  Result Value Ref Range   Magnesium 2.0 1.7 - 2.4 mg/dL    Comment: Performed at [facility] [address], [place], [place] [address]  Phosphorus     Status: None   Collection Time: 06/17/21  1:08 AM  Result Value Ref Range   Phosphorus 3.3 2.5 - 4.6 mg/dL    Comment: Performed at [facility] [address], [place], [place] [address]  Glucose, capillary     Status: Abnormal   Collection Time: 06/17/21  4:58 AM  Result Value Ref Range   Glucose-Capillary 112 (H) 70 - 99 mg/dL    Comment: Glucose reference range applies only to samples taken after fasting for at least 8 hours.  Glucose, capillary     Status: Abnormal   Collection Time: 06/17/21  8:03 AM  Result Value Ref Range   Glucose-Capillary 109 (H) 70 - 99 mg/dL    Comment: Glucose reference range applies only to samples taken after fasting for at least 8 hours.  Glucose, capillary     Status: Abnormal   Collection Time: 06/17/21 11:49 AM  Result Value Ref Range   Glucose-Capillary 136 (H) 70 - 99 mg/dL    Comment: Glucose reference range applies only to samples taken after fasting for at least 8 hours.  Glucose,  capillary     Status: None   Collection Time: 06/17/21  4:03 PM  Result Value Ref Range   Glucose-Capillary 94 70 - 99 mg/dL    Comment: Glucose reference range applies only to samples taken after fasting for at least 8 hours.  Glucose, capillary     Status: Abnormal   Collection Time: 06/17/21  9:10 PM  Result Value Ref Range   Glucose-Capillary 64 (L) 70 - 99 mg/dL    Comment: Glucose reference range applies only to samples taken after fasting for at least 8 hours.  Glucose, capillary     Status: Abnormal   Collection Time: 06/17/21 10:12 PM  Result Value Ref Range   Glucose-Capillary 139 (H) 70 - 99 mg/dL    Comment: Glucose reference range applies only to samples taken after fasting for at least 8 hours.  Glucose, capillary     Status: None   Collection Time: 06/17/21 11:46 PM  Result Value Ref Range   Glucose-Capillary 99 70 - 99 mg/dL    Comment: Glucose reference range applies only to samples taken after fasting for at least 8 hours.  Glucose, capillary     Status: Abnormal   Collection Time: 06/18/21  4:49 AM  Result Value Ref Range   Glucose-Capillary 62 (L) 70 - 99 mg/dL    Comment: Glucose reference range applies only to samples taken after fasting for at least 8 hours.  Glucose, capillary     Status: Abnormal   Collection Time: 06/18/21  6:38 AM  Result Value Ref Range   Glucose-Capillary 140 (H) 70 - 99 mg/dL    Comment: Glucose reference range applies only to samples taken after fasting for at least 8 hours.  Glucose, capillary     Status: None   Collection Time: 06/18/21  7:50 AM  Result Value Ref Range   Glucose-Capillary 85 70 - 99 mg/dL    Comment: Glucose reference range applies only to samples taken after fasting for at least 8 hours.  CBC     Status: Abnormal   Collection Time: 06/18/21 10:27 AM  Result Value Ref Range   WBC 12.8 (H) 4.0 - 10.5 K/uL   RBC 3.08 (L) 4.22 - 5.81 MIL/uL   Hemoglobin 9.2 (L) 13.0 - 17.0 g/dL   HCT 28.6 (L) 39.0 - 52.0 %   MCV  92.9 80.0 - 100.0 fL   MCH 29.9 26.0 - 34.0 pg   MCHC 32.2 30.0 - 36.0 g/dL   RDW 14.4 11.5 - 15.5 %   Platelets 258 150 - 400 K/uL   nRBC 0.0 0.0 - 0.2 %    Comment: Performed at [facility], [place] [address], [place], [place] [address]   No results found.    Assessment/Plan Severe protein calorie malnutrition The patient has been seen and examined and imaging, labs, and vitals have been personally reviewed.  It appears the patient has requested a g-tube.  He is able to swallow and is on a D3 diet but is severely malnourished and doesn't take in much.  Attempt at percutaneous placement today was unsuccessful and procedure aborted.  We have been asked to see for open g-tube placement.  Will discussed with MD regard lap vs open approach, concern with open approach may be his ability to heal an incisional wound.  He also still had a fairly significant appearing PNA on his CT abdomen from last week.  He is on trach collar off the vent, but depending on how he tolerates the surgery, he could require a return to the ventilator post op to work on another wean. Discussed with primary team will also need to hold xarelto 48-72 hrs prior to surgical intervention, will discuss timing of OR with MD today and update patient.    FEN - D3 diet with supplemental TF VTE - currently on xarelto, last dose this AM ID - rocephin for bacteremia     Barkley Boards, [facility] Surgery 06/18/2021, 11:41 AM Please see Amion for pager number during day hours 7:00am-4:30pm or 7:00am -11:30am on weekends

## 2021-06-18 NOTE — Progress Notes (Addendum)
Paged due to patient partially pulling out NG tube. On evaluation, he is HDS with oxygen saturations >95% on 5L trach collar. NG tube remains in partially in place. I instructed RN to go ahead and pull NG. Coarse breath sounds bilaterally on ausculation, no increased work of breathing. On chart review, he did have glucose of 62 this morning. He currently has clear nectar diet. Last BG at 108 at 1852.  Patient states that he no longer wishes to have PEG tube placed, he states that he has had enough done with the tracheostomy and does not want further procedures. From chart review appears that he was still wanting PEG tube when he talked with IMTS and palliative today. He appears tired and indicated he would prefer to talk about decision at a later time. A: Patient is at risk of further hypoglycemia events as he had one this AM while receiving tube feeds. I did not pursue in depth discussion with patient about PEG tube decision as he appeared fatigued and depressed and did not seem open to discussing more at this time. P: q4 CBG checks

## 2021-06-18 NOTE — Sedation Documentation (Signed)
Patient is resting comfortably. 

## 2021-06-18 NOTE — Consult Note (Signed)
David [name] November 30, 1975  [id number].    Requesting MD: Dr. 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  There is some concerned for endocarditis but due to is h/o radiation and chemotherapy, a TEE can not be performed.  During his stay he required a tracheostomy tube to be placed due to respiratory compromise.  He is currently on trach collar.  He underwent attempt at percutaneous g-tube placement by IR today, but the procedure was aborted due to his anatomy.  We have been asked to see him for evaluation of an open g-tube placement.  ROS: ROS: Please see HPI  History reviewed. No pertinent family history.  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[place])    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 ([place])    Pneumothorax    Left lung spontaneous pneumothorax at age 44 yr     Past Surgical History:  Procedure Laterality Date   chest tubes Left    TRACHEOSTOMY TUBE PLACEMENT N/A 06/09/2021   Procedure: AWAKE TRACHEOSTOMY;  Surgeon: Melida Quitter, MD;  Location: [facility] OR;  Service: ENT;  Laterality: N/A;    Social History:  reports that he has been smoking cigarettes. He has been smoking an average of 3 packs per day. He has never used smokeless tobacco. He reports that he does not currently use alcohol after a past usage of about 12.0 standard drinks per week. He reports that he does not currently use drugs after having used the following drugs: IV.  Allergies: No Known Allergies  Medications Prior to Admission   Medication Sig Dispense Refill   carvedilol (COREG) 6.25 MG tablet Take 1 tablet (6.25 mg total) by mouth 2 (two) times daily. 60 tablet 11   clotrimazole (MYCELEX) 10 MG troche Take 1 tablet (10 mg total) by mouth 5 (five) times daily. Suck on these slowly. 70 Troche 0   guaiFENesin (MUCINEX) 600 MG 12 hr tablet Take 2 tablets (1,200 mg total) by mouth 2 (two) times daily. (Patient taking differently: Take 1,200 mg by mouth 2 (two) times daily as needed for cough or to loosen phlegm.) 60 tablet 1   lidocaine (XYLOCAINE) 2 % solution Patient: Mix 1part 2% viscous lidocaine, 1part H20. Swish/swallow 46m of diluted mixture, 351m before meals and '@bedtime'$ , up to QID prn soreness 200 mL 2   methadone (DOLOPHINE) 10 MG/ML solution Take 6 mLs (60 mg total) by mouth daily. (Patient taking differently: Take 50 mg by mouth daily.) 90 mL 0   ondansetron (ZOFRAN) 8 MG tablet Take 1 tablet (8 mg total) by mouth every 8 (eight) hours as needed for nausea or vomiting. Starting day 3 after chemotherapy 30 tablet 2   amoxicillin-clavulanate (AUGMENTIN) 875-125 MG tablet Take 1 tablet by mouth every 12 (twelve) hours. (Patient not taking: Reported on 06/06/2021) 14 tablet 0   dronabinol (MARINOL) 2.5 MG capsule Take 2.5 mg by mouth 2 (two) times daily before a meal. (Patient not taking: Reported on 03/31/2021)     nicotine (NICODERM CQ - DOSED IN MG/24 HOURS) 14 mg/24hr patch  Place 1 patch (14 mg total) onto the skin daily. Apply 21 mg patch daily x 6 wk, then '14mg'$  patch daily x 2 wk, then 7 mg patch daily x 2 wk (Patient not taking: Reported on 06/06/2021) 14 patch 0   sacubitril-valsartan (ENTRESTO) 49-51 MG Take 1 tablet by mouth 2 (two) times daily. (Patient not taking: Reported on 06/06/2021) 60 tablet 2   traMADol (ULTRAM) 50 MG tablet Take 1 tablet (50 mg total) by mouth 2 (two) times daily. (Patient not taking: Reported on 06/06/2021) 60 tablet 0     Physical Exam: Blood pressure (!) 88/67, pulse 66,  temperature 98.1 F (36.7 C), temperature source Oral, resp. rate 18, height '5\' 11"'$  (1.803 m), weight 42.1 kg, SpO2 98 %. General: pleasant, WD, cachectic male who is laying in bed in NAD HEENT: head is normocephalic, atraumatic.  Sclera are noninjected. Ears and nose without any masses or lesions.  Mouth is pink and dry Heart: regular, rate, and rhythm.   Lungs: Trach present.  Respiratory effort nonlabored Abd: soft, NT, ND, +BS, no masses, hernias, or organomegaly MS: all 4 extremities are symmetrical with no cyanosis, clubbing, or edema. Skin: warm and dry with no masses, lesions, or rashes Psych: A&Ox3 with an angry affect.   Results for orders placed or performed during the hospital encounter of 06/06/21 (from the past 48 hour(s))  Glucose, capillary     Status: Abnormal   Collection Time: 06/16/21 12:32 PM  Result Value Ref Range   Glucose-Capillary 149 (H) 70 - 99 mg/dL    Comment: Glucose reference range applies only to samples taken after fasting for at least 8 hours.  Glucose, capillary     Status: Abnormal   Collection Time: 06/16/21  3:51 PM  Result Value Ref Range   Glucose-Capillary 123 (H) 70 - 99 mg/dL    Comment: Glucose reference range applies only to samples taken after fasting for at least 8 hours.  Glucose, capillary     Status: Abnormal   Collection Time: 06/16/21  8:37 PM  Result Value Ref Range   Glucose-Capillary 129 (H) 70 - 99 mg/dL    Comment: Glucose reference range applies only to samples taken after fasting for at least 8 hours.  CBC     Status: Abnormal   Collection Time: 06/17/21  1:08 AM  Result Value Ref Range   WBC 12.4 (H) 4.0 - 10.5 K/uL   RBC 2.99 (L) 4.22 - 5.81 MIL/uL   Hemoglobin 8.8 (L) 13.0 - 17.0 g/dL   HCT 27.5 (L) 39.0 - 52.0 %   MCV 92.0 80.0 - 100.0 fL   MCH 29.4 26.0 - 34.0 pg   MCHC 32.0 30.0 - 36.0 g/dL   RDW 14.0 11.5 - 15.5 %   Platelets 263 150 - 400 K/uL   nRBC 0.0 0.0 - 0.2 %    Comment: Performed at [facility], [place] [address], [place], [place] [address]  Basic metabolic panel     Status: Abnormal   Collection Time: 06/17/21  1:08 AM  Result Value Ref Range   Sodium 134 (L) 135 - 145 mmol/L   Potassium 4.4 3.5 - 5.1 mmol/L   Chloride 98 98 - 111 mmol/L   CO2 31 22 - 32 mmol/L   Glucose, Bld 110 (H) 70 - 99 mg/dL    Comment: Glucose reference range applies only to samples taken after fasting for at least 8 hours.   BUN 11 6 - 20 mg/dL  Creatinine, Ser 0.46 (L) 0.61 - 1.24 mg/dL   Calcium 8.0 (L) 8.9 - 10.3 mg/dL   GFR, Estimated >60 >60 mL/min    Comment: (NOTE) Calculated using the CKD-EPI Creatinine Equation (2021)    Anion gap 5 5 - 15    Comment: Performed at [facility] [address] [place], [place] [address]  Magnesium     Status: None   Collection Time: 06/17/21  1:08 AM  Result Value Ref Range   Magnesium 2.0 1.7 - 2.4 mg/dL    Comment: Performed at [facility] [address] [place],  [address]  Phosphorus     Status: None   Collection Time: 06/17/21  1:08 AM  Result Value Ref Range   Phosphorus 3.3 2.5 - 4.6 mg/dL    Comment: Performed at [facility] [address] [place], [place] [address]  Glucose, capillary     Status: Abnormal   Collection Time: 06/17/21  4:58 AM  Result Value Ref Range   Glucose-Capillary 112 (H) 70 - 99 mg/dL    Comment: Glucose reference range applies only to samples taken after fasting for at least 8 hours.  Glucose, capillary     Status: Abnormal   Collection Time: 06/17/21  8:03 AM  Result Value Ref Range   Glucose-Capillary 109 (H) 70 - 99 mg/dL    Comment: Glucose reference range applies only to samples taken after fasting for at least 8 hours.  Glucose, capillary     Status: Abnormal   Collection Time: 06/17/21 11:49 AM  Result Value Ref Range   Glucose-Capillary 136 (H) 70 - 99 mg/dL    Comment: Glucose reference range applies only to samples taken after fasting for at least 8 hours.  Glucose,  capillary     Status: None   Collection Time: 06/17/21  4:03 PM  Result Value Ref Range   Glucose-Capillary 94 70 - 99 mg/dL    Comment: Glucose reference range applies only to samples taken after fasting for at least 8 hours.  Glucose, capillary     Status: Abnormal   Collection Time: 06/17/21  9:10 PM  Result Value Ref Range   Glucose-Capillary 64 (L) 70 - 99 mg/dL    Comment: Glucose reference range applies only to samples taken after fasting for at least 8 hours.  Glucose, capillary     Status: Abnormal   Collection Time: 06/17/21 10:12 PM  Result Value Ref Range   Glucose-Capillary 139 (H) 70 - 99 mg/dL    Comment: Glucose reference range applies only to samples taken after fasting for at least 8 hours.  Glucose, capillary     Status: None   Collection Time: 06/17/21 11:46 PM  Result Value Ref Range   Glucose-Capillary 99 70 - 99 mg/dL    Comment: Glucose reference range applies only to samples taken after fasting for at least 8 hours.  Glucose, capillary     Status: Abnormal   Collection Time: 06/18/21  4:49 AM  Result Value Ref Range   Glucose-Capillary 62 (L) 70 - 99 mg/dL    Comment: Glucose reference range applies only to samples taken after fasting for at least 8 hours.  Glucose, capillary     Status: Abnormal   Collection Time: 06/18/21  6:38 AM  Result Value Ref Range   Glucose-Capillary 140 (H) 70 - 99 mg/dL    Comment: Glucose reference range applies only to samples taken after fasting for at least 8 hours.  Glucose, capillary     Status: None   Collection Time: 06/18/21  7:50 AM  Result Value Ref Range   Glucose-Capillary 85 70 - 99 mg/dL    Comment: Glucose reference range applies only to samples taken after fasting for at least 8 hours.  CBC     Status: Abnormal   Collection Time: 06/18/21 10:27 AM  Result Value Ref Range   WBC 12.8 (H) 4.0 - 10.5 K/uL   RBC 3.08 (L) 4.22 - 5.81 MIL/uL   Hemoglobin 9.2 (L) 13.0 - 17.0 g/dL   HCT 28.6 (L) 39.0 - 52.0 %   MCV  92.9 80.0 - 100.0 fL   MCH 29.9 26.0 - 34.0 pg   MCHC 32.2 30.0 - 36.0 g/dL   RDW 14.4 11.5 - 15.5 %   Platelets 258 150 - 400 K/uL   nRBC 0.0 0.0 - 0.2 %    Comment: Performed at [facility], [place] [address] [place], [place] [address]   No results found.    Assessment/Plan Severe protein calorie malnutrition The patient has been seen and examined and imaging, labs, and vitals have been personally reviewed.  It appears the patient has requested a g-tube.  He is able to swallow and is on a D3 diet but is severely malnourished and doesn't take in much.  Attempt at percutaneous placement today was unsuccessful and procedure aborted.  We have been asked to see for open g-tube placement.  Will discussed with MD regard lap vs open approach, concern with open approach may be his ability to heal an incisional wound.  He also still had a fairly significant appearing PNA on his CT abdomen from last week.  He is on trach collar off the vent, but depending on how he tolerates the surgery, he could require a return to the ventilator post op to work on another wean. Discussed with primary team will also need to hold xarelto 48-72 hrs prior to surgical intervention, will discuss timing of OR with MD today and update patient.    FEN - D3 diet with supplemental TF VTE - currently on xarelto, last dose this AM ID - rocephin for bacteremia     Barkley Boards, [facility] Surgery 06/18/2021, 11:41 AM Please see Amion for pager number during day hours 7:00am-4:30pm or 7:00am -11:30am on weekends

## 2021-06-18 NOTE — Sedation Documentation (Signed)
Patient is resting

## 2021-06-18 NOTE — Sedation Documentation (Signed)
Vital signs stable. 

## 2021-06-18 NOTE — Sedation Documentation (Signed)
Totals: Time-46 mins Fentanyl 180mg Versed '2mg'$  Ancef 2g Glucagen '1mg'$ 

## 2021-06-18 NOTE — Progress Notes (Signed)
Daily Progress Note   Patient Name: David [name]       Date: 06/18/2021 DOB: August 15, 1975  Age: 46 y.o. MRN#: [id number] Attending Physician: Lottie Mussel, MD Primary Care Physician: Delene Ruffini, MD Admit Date: 06/06/2021  Reason for Consultation/Follow-up: Establishing goals of care  Subjective: Chart review performed including labs, imaging, progress notes. Received report from RN. Discussed with Dr. Cain Sieve at the bedside. No family present during my visit.  After reviewing course of illness over the past 2 weeks of hospitalization, created space and opportunity for patient's thoughts and feelings on his current illness.  He shares his frustration that yesterday he was left in bed dirty for prolonged time after urinating on himself.  He is also frustrated by the unclear expectations for duration of tracheostomy and shares he only agreed to make his mom happy.  He wishes he did not listen to her and states he has already been on the trach for far too long.  His goal is to return to his apartment and continue playing his video games with a focus on quality of life.  He also continues to desire a PEG for nutrition.  Reviewed the options of continuing aggressive care versus transitioning to comfort care and hospice at length.  Counseled patient to consider whether quality of life or quantity of life is his priority.  He tells me he wants both but if required to prioritize one, he wants to live as long as possible.  He is motivated to continue "fighting" as he has already beaten cancer.  He feels that a lot of his current complications are due to staff mistakes, but does not specify what mistakes.  He feels it is our duty to fix what we caused as fast as possible.  Counseled patient that  unfortunately, many patients who are as sick as he is ultimately require months of prolonged recovery with skilled care needs requiring long-term placement in a facility.  Counseled patient on the risk of further complications and setbacks with worsening quality of life and symptom burden.  He is understandably emotional.  He tells me he was never told this and I encouraged him to consider his options and make an informed decision now understanding potential/probable trajectories.  He continues to desire full code and full scope treatment.  All questions and concerns addressed. Encouraged  to call with questions and/or concerns. PMT card provided.  Length of Stay: 12  Physical Exam Vitals and nursing note reviewed.  Constitutional:      General: He is not in acute distress.    Appearance: He is cachectic. He is ill-appearing.     Comments: 5L on trach collar  Neck:     Trachea: Tracheostomy present.  Cardiovascular:     Rate and Rhythm: Normal rate.  Pulmonary:     Effort: Pulmonary effort is normal. No respiratory distress.  Skin:    General: Skin is warm and dry.  Neurological:     Mental Status: He is oriented to person, place, and time.     Motor: Weakness present.  Psychiatric:        Mood and Affect: Mood is anxious.        Behavior: Behavior is cooperative.            Vital Signs: BP 107/71 (BP Location: Right Arm)   Pulse 68   Temp 98.2 F (36.8 C) (Oral)   Resp (!) 21   Ht '5\' 11"'$  (1.803 m) Comment: from previous admission  Wt 42.1 kg   SpO2 98%   BMI 12.94 kg/m  SpO2: SpO2: 98 % O2 Device: O2 Device: Tracheostomy Collar O2 Flow Rate: O2 Flow Rate (L/min): 5 L/min  Palliative Assessment/Data: PPS 30% with tube feeds.     Palliative Care Assessment & Plan   Patient Profile: 46 y.o. male  with past medical history of laryngeal cancer s/p chemo and radiation and heroin abuse presented to ED on 06/06/21 from home with complaints of shortness of breath and not being  able to eat for several days. Patient was admitted on 06/06/2021 with acute hypoxic respiratory failure with sepsis secondary to aspiration pneumonia, elevated D-dimer, AKI.  Per conversations with CCM patient would not want intubation and now DNR/DNI.  Patient has adamantly declined feeding tubes in the past, but now states he would be open to receiving one.   Of note, patient is followed by outpatient Palliative Care at [facility].  Assessment: Principal Problem:   Aspiration pneumonia of right lower lobe (HCC) Active Problems:   Laryngeal cancer (HCC)   Goals of care, counseling/discussion   Protein-calorie malnutrition, severe   Drug abuse and dependence ([place])   Community acquired pneumonia due to Pneumococcus [facility])   Anxiety state   Pneumococcal bacteremia   Supraglottic airway obstruction   Chronic HFrEF (heart failure with reduced ejection fraction) (HCC)   Tracheostomy status (HCC)   Acute respiratory failure with hypoxia (HCC)   Bacterial endocarditis   Pressure injury of skin   Acute hypoxic respiratory failure   Chronic aspiration   Stage IV head and neck cancer, completed radiation in 2023, now with chronic supraglottic airway edema   Sepsis   Severe protein energy malnutrition   Chronic Systolic and Diastolic HF   Polysubstance Use Disorder  Recommendations/Plan: Continue full code/full scope Patient is hopeful for weaning O2 and eventually removing trach but does not want to discontinue before medically indicated or transition to comfort focused care Goal is still for PEG placement and to live as long as he can PMT will continue to follow peripherally. If there are any imminent needs please call the service directly  Prognosis:  Unable to determine  Discharge Planning: To Be Determined  Care plan was discussed with primary RN, patient, Dr. Cain Sieve   MDM: High   Dorthy Cooler, [facility] Palliative Medicine Team Team phone #  [phone]  Thank you for  allowing the Palliative Medicine Team to assist in the care of this patient. Please utilize secure chat with additional questions, if there is no response within 30 minutes please call the above phone number.  Palliative Medicine Team providers are available by phone from 7am to 7pm daily and can be reached through the team cell phone.  Should this patient require assistance outside of these hours, please call the patient's attending physician.

## 2021-06-18 NOTE — Sedation Documentation (Signed)
Procedure aborted per Dr Dwaine Gale. Unable to place gastrostomy tube safely due to questionable anatomy

## 2021-06-18 NOTE — Sedation Documentation (Signed)
Procedure continues, VSS. 

## 2021-06-18 NOTE — Progress Notes (Signed)
Physical Therapy Treatment Patient Details Name: David [name] MRN: [id number] DOB: 17-Dec-1975 Today's Date: 06/18/2021   History of Present Illness Pt adm 5/10 with acute hypoxic respiratory failure due to aspiration PNA and laryngeal obstruction due to stage IV head and neck CA. Pt had trach placed on 5/13. PMH - CHF, malnutrition, Heroin use now on methadone    PT Comments    With encouragement pt agreeable to work with therapy. Pt limited by decreased reserve and increased secretions clearance needed with sitting upright. Pt is min guard for bed mobility. Pt sits EoB for ~3 min and then starts to get back in bed. PT educated that recovery is going to be uncomfortable and that he will need to push himself to get his mobility back. Pt agrees and sits 30 sec and then returns to bed. PT will continue to follow acutely.    Recommendations for follow up therapy are one component of a multi-disciplinary discharge planning process, led by the attending physician.  Recommendations may be updated based on patient status, additional functional criteria and insurance authorization.  Follow Up Recommendations  Home health PT     Assistance Recommended at Discharge Frequent or constant Supervision/Assistance  Patient can return home with the following A little help with walking and/or transfers;A little help with bathing/dressing/bathroom;Assist for transportation;Assistance with cooking/housework;Help with stairs or ramp for entrance   Equipment Recommendations  Rollator (4 wheels)    Recommendations for Other Services       Precautions / Restrictions Precautions Precautions: Fall Restrictions Weight Bearing Restrictions: No     Mobility  Bed Mobility Overal bed mobility: Needs Assistance Bed Mobility: Supine to Sit, Sit to Supine     Supine to sit: Min guard Sit to supine: Min guard   General bed mobility comments: pt able to come to EoB with min guard, tolerates  approximately 3 min of sitting before stating he is getting back to bed, PT able to convince pt to sit for 30 more sec and then he returns to supine          Balance Overall balance assessment: Needs assistance Sitting-balance support: Feet supported, Bilateral upper extremity supported Sitting balance-Leahy Scale: Poor Sitting balance - Comments: able to sit about 3.5 minutes                                    Cognition Arousal/Alertness: Awake/alert Behavior During Therapy: WFL for tasks assessed/performed Overall Cognitive Status: Within Functional Limits for tasks assessed                                 General Comments: PMSV in place, raspy voice but able to understand pt needs           General Comments General comments (skin integrity, edema, etc.): BP 85/69 in sitting which is in line with supine measurements, Pt on 28%O2 Trach collar, SpO2 >90%O2      Pertinent Vitals/Pain Pain Assessment Pain Assessment: Faces Faces Pain Scale: Hurts whole lot Pain Location: generalized Pain Descriptors / Indicators: Grimacing Pain Intervention(s): Limited activity within patient's tolerance, Monitored during session, Repositioned     PT Goals (current goals can now be found in the care plan section) Acute Rehab PT Goals Patient Stated Goal: not stated PT Goal Formulation: With patient Time For Goal Achievement: 06/25/21 Potential to Achieve Goals: Good Progress towards  PT goals: Progressing toward goals    Frequency    Min 3X/week      PT Plan Current plan remains appropriate       AM-PAC PT "6 Clicks" Mobility   Outcome Measure  Help needed turning from your back to your side while in a flat bed without using bedrails?: None Help needed moving from lying on your back to sitting on the side of a flat bed without using bedrails?: A Little Help needed moving to and from a bed to a chair (including a wheelchair)?: A Lot Help needed  standing up from a chair using your arms (e.g., wheelchair or bedside chair)?: A Lot Help needed to walk in hospital room?: A Lot Help needed climbing 3-5 steps with a railing? : Total 6 Click Score: 14    End of Session Equipment Utilized During Treatment: Oxygen Activity Tolerance: Patient limited by fatigue Patient left: in bed;with call bell/phone within reach;with bed alarm set Nurse Communication: Mobility status PT Visit Diagnosis: Unsteadiness on feet (R26.81);Other abnormalities of gait and mobility (R26.89);Muscle weakness (generalized) (M62.81);Adult, failure to thrive (R62.7)     Time: 3818-2993 PT Time Calculation (min) (ACUTE ONLY): 11 min  Charges:  $Therapeutic Activity: 8-22 mins                     Isadore Palecek B. Migdalia Dk PT, DPT Acute Rehabilitation Services Please use secure chat or  Call Office [phone]    [place] 06/18/2021, 2:41 PM

## 2021-06-19 DIAGNOSIS — F411 Generalized anxiety disorder: Secondary | ICD-10-CM | POA: Diagnosis not present

## 2021-06-19 DIAGNOSIS — I330 Acute and subacute infective endocarditis: Secondary | ICD-10-CM | POA: Diagnosis not present

## 2021-06-19 DIAGNOSIS — J690 Pneumonitis due to inhalation of food and vomit: Secondary | ICD-10-CM | POA: Diagnosis not present

## 2021-06-19 DIAGNOSIS — Z7189 Other specified counseling: Secondary | ICD-10-CM | POA: Diagnosis not present

## 2021-06-19 LAB — GLUCOSE, CAPILLARY
Glucose-Capillary: 105 mg/dL — ABNORMAL HIGH (ref 70–99)
Glucose-Capillary: 116 mg/dL — ABNORMAL HIGH (ref 70–99)
Glucose-Capillary: 132 mg/dL — ABNORMAL HIGH (ref 70–99)
Glucose-Capillary: 139 mg/dL — ABNORMAL HIGH (ref 70–99)
Glucose-Capillary: 78 mg/dL (ref 70–99)
Glucose-Capillary: 79 mg/dL (ref 70–99)
Glucose-Capillary: 86 mg/dL (ref 70–99)

## 2021-06-19 LAB — CBC
HCT: 26.5 % — ABNORMAL LOW (ref 39.0–52.0)
Hemoglobin: 8.7 g/dL — ABNORMAL LOW (ref 13.0–17.0)
MCH: 29.8 pg (ref 26.0–34.0)
MCHC: 32.8 g/dL (ref 30.0–36.0)
MCV: 90.8 fL (ref 80.0–100.0)
Platelets: 247 10*3/uL (ref 150–400)
RBC: 2.92 MIL/uL — ABNORMAL LOW (ref 4.22–5.81)
RDW: 14.3 % (ref 11.5–15.5)
WBC: 6.4 10*3/uL (ref 4.0–10.5)
nRBC: 0 % (ref 0.0–0.2)

## 2021-06-19 MED ORDER — ENOXAPARIN SODIUM 30 MG/0.3ML IJ SOSY
30.0000 mg | PREFILLED_SYRINGE | INTRAMUSCULAR | Status: DC
  Administered 2021-06-20 – 2021-07-08 (×19): 30 mg via SUBCUTANEOUS
  Filled 2021-06-19 (×19): qty 0.3

## 2021-06-19 MED ORDER — DEXTROSE 50 % IV SOLN: 25.0000 g | Freq: Once | INTRAVENOUS | Status: DC

## 2021-06-19 MED ORDER — IBUPROFEN 600 MG PO TABS
600.0000 mg | ORAL_TABLET | Freq: Four times a day (QID) | ORAL | Status: DC
  Administered 2021-06-19 – 2021-06-27 (×29): 600 mg via ORAL
  Filled 2021-06-19 (×32): qty 1

## 2021-06-19 NOTE — Progress Notes (Signed)
Spoke with MD about pt's sutures around his trach. Awaiting MD order to remove sutures.

## 2021-06-19 NOTE — Progress Notes (Signed)
4 sutures removed from trach.

## 2021-06-19 NOTE — Progress Notes (Signed)
Nutrition Follow-up  DOCUMENTATION CODES:   Severe malnutrition in context of chronic illness, Underweight  INTERVENTION:   Tube feeding via PEG: Osmolite 1.5 at 45 mL/h (1080 mL per day). 45 mL ProSource TF - daily  Provides 1660 kcal, 79 gm protein, 823 ml free water daily. Recommend obtaining new weight. RN notified. Meal ordering with assist Continue Multivitamin w/ minerals daily  NUTRITION DIAGNOSIS:   Severe Malnutrition related to chronic illness (head and neck cancer) as evidenced by severe muscle depletion, severe fat depletion, percent weight loss (23% weight loss within 3 months). - Ongoing  GOAL:   Patient will meet greater than or equal to 90% of their needs - Being met with TF  MONITOR:   PO intake, Diet advancement, Labs, Weight trends, TF tolerance, Skin  REASON FOR ASSESSMENT:   Consult Assessment of nutrition requirement/status, Enteral/tube feeding initiation and management  ASSESSMENT:   46 yo male admitted with respiratory failure, suspected aspiration PNA. 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  5/12 - Cortrak placed (tip post pyloric) 5/13 - TF started; Trach placed 5/15 - diet advanced to Dysphagia 3, Honey thick liquids 5/22 - attempted placement of PEG in IR (not placed 2/2 anatomy) 5/23 - Cortrak removed 5/24 - PEG placed  Pt unavailable at time of RD visit, pt in OR getting PEG placed.  RD to continue to follow and adjust TF regimen prior to discharge.   No meal documentation's have been recorded thus far. Per SLP note, plan for [facility] tomorrow.   Pt without an updated weight in over a week. Would recommend obtaining new weight. Pt with daily weights already ordered.   Medications reviewed and include: Folic acid, MVI, Protonix, Miralax, Senokot, Thiamine, IV antibiotics  Labs reviewed: Sodium 130, Potassium 5.4, Creatinine 0.45,  Magnesium 1.6, 24 hr CBG 99-190  Diet Order:   Diet Order             DIET DYS 3 Room service appropriate? Yes; Fluid consistency: Honey Thick  Diet effective now                   EDUCATION NEEDS:   No education needs have been identified at this time  Skin:  Skin Assessment: Skin Integrity Issues: Skin Integrity Issues:: Stage II Stage II: Sacrum  Last BM:  5/20  Height:   Ht Readings from Last 1 Encounters:  06/07/21 5' 11"  (1.803 m)    Weight:   Wt Readings from Last 1 Encounters:  06/12/21 42.1 kg    Ideal Body Weight:  78.2 kg  BMI:  Body mass index is 12.94 kg/m.  Estimated Nutritional Needs:  Kcal:  1500-1700 Protein:  65-75 gm Fluid:  1.5-1.7 L   Hermina Barters RD, LDN Clinical Dietitian See [facility] for contact information.

## 2021-06-19 NOTE — TOC Progression Note (Addendum)
Transition of Care [facility]) - Progression Note    Patient Details  Name: David [name] MRN: [id number] Date of Birth: 1975-06-13  Transition of Care [facility]) CM/SW Contact  Carles Collet, RN Phone Number: 06/19/2021, 1:36 PM  Clinical Narrative:    Patient continues with IV Abx through 6/9. Pending PEG placement hopefully tomorrow. Trach placed on 5L. Limiting factors of [name], Hx SA, and current methadone treatment make him an unlikely candidate for SNF. Would not be eligible to even start SNF process until trach is 93 days old, but again placement not likely. LTAC not accepting due to Medicaid.   Therefore plans to home need to be made. LVM again for [name] with [name], home trach division, awaiting callback.  Continue to educate patient's mother on trach care.  Patient and mother will both need education on PEG once placed.    16:00 Received call back from [name] with [name], faxed referral for [facility] RN with extended service hours to him at [phone]    Expected Discharge Plan: [name] Barriers to Discharge: Continued Medical Work up  Expected Discharge Plan and Services Expected Discharge Plan: [place]                                               Social Determinants of Health (SDOH) Interventions    Readmission Risk Interventions     View : No data to display.

## 2021-06-19 NOTE — Progress Notes (Addendum)
Hospital day: 13  Subjective:   Overnight events: Patient partially pulled out NG tube, reports he wanted it removed because he is eating now and feels that he doesn't need it. NG tube removed completely. Patient also stated he no longer wants PEG tube, was depressed and fatigued appearing.   Patient seen at bedside during morning rounds. He states that his tooth fell out. He expresses continued frustration and unhappiness about being here, having a trach, and the failed attempt at PEG tube placement. He confirms that his goals are to leave the hospital and to live as long as possible. We discussed how receiving a PEG tube aligns with his goals, he is agreeable to one more attempt to place it.   Objective:  Vital signs in last 24 hours: Vitals:   06/19/21 0325 06/19/21 0336 06/19/21 0753 06/19/21 0825  BP: (!) 86/62 (!) 86/62 90/63   Pulse: 74 69 71 81  Resp: '18 16 20 20  '$ Temp: 98.1 F (36.7 C)  97.9 F (36.6 C)   TempSrc: Oral  Oral   SpO2: 97% 97% 99% 97%  Weight:      Height:        Filed Weights   06/08/21 0500 06/09/21 0500 06/12/21 0500  Weight: 38.2 kg 41.8 kg 42.1 kg     Intake/Output Summary (Last 24 hours) at 06/19/2021 0650 Last data filed at 06/19/2021 0645 Gross per 24 hour  Intake 100 ml  Output 400 ml  Net -300 ml   Net IO Since Admission: -956.07 mL [06/19/21 0650]   Pertinent Labs:    Latest Ref Rng & Units 06/19/2021    2:01 AM 06/18/2021   10:27 AM 06/17/2021    1:08 AM  CBC  WBC 4.0 - 10.5 K/uL 6.4   12.8   12.4    Hemoglobin 13.0 - 17.0 g/dL 8.7   9.2   8.8    Hematocrit 39.0 - 52.0 % 26.5   28.6   27.5    Platelets 150 - 400 K/uL 247   258   263         Latest Ref Rng & Units 06/17/2021    1:08 AM 06/15/2021    1:27 AM 06/14/2021    2:40 AM  CMP  Glucose 70 - 99 mg/dL 110   82   116    BUN 6 - 20 mg/dL '11   14   14    '$ Creatinine 0.61 - 1.24 mg/dL 0.46   0.43   0.45    Sodium 135 - 145 mmol/L 134   130   130    Potassium 3.5 - 5.1  mmol/L 4.4   4.5   5.4    Chloride 98 - 111 mmol/L 98   94   97    CO2 22 - 32 mmol/L '31   29   24    '$ Calcium 8.9 - 10.3 mg/dL 8.0   8.1   8.1      Recent Labs    06/19/21 0045 06/19/21 0328 06/19/21 0504  GLUCAP 116* 79 132*     Imaging: None  Physical Exam  General: Cachectic, ill appearing HEENT: Trach collar present  CV: Deferred due to patient's mood Pulmonary: Normal WOB on TC 5L  Abdominal: Deferred Extremities: No LE edema Skin: No rashes seen Neuro: A&O x3 Psych: Depressed, angry, tearful at times   Assessment/Plan: David [name] is a 46 y.o. 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 Hospital course has been complicated by ICU admission requiring tracheostomy, bacteremia, concern for endocarditis, and persistent hypotension.   Principal Problem:   Aspiration pneumonia of right lower lobe (HCC) Active Problems:   Laryngeal cancer (HCC)   Goals of care, counseling/discussion   Protein-calorie malnutrition, severe   Drug abuse and dependence ([place])   Community acquired pneumonia due to Pneumococcus [facility])   Anxiety state   Pneumococcal bacteremia   Supraglottic airway obstruction   Chronic HFrEF (heart failure with reduced ejection fraction) (HCC)   Tracheostomy status (HCC)   Acute respiratory failure with hypoxia (HCC)   Bacterial endocarditis   Pressure injury of skin  # Acute respiratory failure with hypoxia # s/p tracheostomy placement # Aspiration pneumonia, treated Tracheostomy placed 5/13 for respiratory compromise in ICU. Still making significant secretions. Completed 5- day course of Flagyl and Rocephin on 5/21 for aspiration pneumonia.  - Wean O2 as tolerated  - Routine trach care - Guaifenesin 5 mL q6h PRN secretions  - Continuous pulse ox monitoring   # Endocarditis  # Strep pneumo bacteremia, resolved  Leukocytosis  resolved, WBC today 6.4. TTE with thickened anterior mitral leaflet with possible small hypermobile density concerning for vegetation. Per Cardiology, patient not a candidate for TEE given his history of radiation for laryngeal cancer. Patient remains afebrile, plan to continue empiric treatment for endocarditis based on TTE findings and history of recent IVDU.  - Ceftriaxone x 4 weeks (end date 07/06/21) - Daily CBC  - Pending d/c to long-term care facility after PEG placement   # Hypotension  SBP 70-90s, continuing to closely monitor.  - Midodrine 10 mg TID  - IVF PRN to keep MAP > 60  # Protein-calorie malnutrition, severe Patient partially removed NG yesterday evening, feels like his po intake is improved. Discussed our concern for aspiration with po intake, and that PEG tube placement is important for his goal of maintaining appropriate nutrition and living as long as possible. We will ask Speech Therapy to re-evaluate him today. He agreed to one more try tomorrow with General Surgery to place PEG after 48 hours Xarelto our wash-out.  - Gen Surg consulted, appreciate assistance - PEG tube placement planned for tomorrow  - Nectar thick liquids per SLP recs  - SLP to re-evaluate today  - CBG q4h  # Normocytic anemia, stable  Hgb 8.7. Will continue to monitor.  - Daily CBC - Transfuse for Hgb < 7   # Polysubstance use disorder  OUD on methadone, recent IVDU prior to hospitalization, was smoking 2-3 packs of cigarettes daily.  - Methadone 50 mg daily  - Nicotine patch daily   # Chronic pain  Continues to complain of significant pain. Will start scheduled Ibuprofen today as he has no current contraindications to NSAIDs - Cr 0.46. Pain management with opioids and benzodiazepines limited by persistent hypotension.  - Start Ibuprofen 600 mg q6 hours  - Tylenol 1000 mg TID - Duloxetine 30 mg daily  - Oxycodone 5 mg q6h PRN mod/severe pain  - Lidocaine patch daily  - Exedrin tab q6h PRN  headache   # Anxiety, Depression  # Goals of care Seen by Palliative Medicine Team yesterday. They discussed continuing full code and full scope care. Patient endorsed wanting PEG placement and to live as long as he can at the time, however later yesterday he stated that he no longer wanted the PEG tube. Today he  agrees to Gen Surg attempting PEG placement one more time.  - Hydroxyzine 50 mg q6h PRN anxiety  - Duloxetine 30 mg daily  - Palliative Care consulted, appreciate assistance   # Hepatitis C  HCV RNA 792,000 on admission, indicative of chronic infection. ID recommended outpatient treatment.  Diet: Dysphagia 3  IVF: None VTE: Lovenox CODE: Full  Prior to Admission Living Arrangement: Home Anticipated Discharge Location: Long-term care facility  Barriers to Discharge: Medical stability  Dispo: Anticipated discharge in 2.5 weeks   Signed: Sabino Dick, MS4 Internal Medicine Teaching Service [phone] Please contact the on call pager after 5 pm and on weekends at [phone].

## 2021-06-20 ENCOUNTER — Inpatient Hospital Stay (HOSPITAL_COMMUNITY): Payer: Medicaid Other | Admitting: Certified Registered Nurse Anesthetist

## 2021-06-20 ENCOUNTER — Other Ambulatory Visit: Payer: Self-pay

## 2021-06-20 ENCOUNTER — Encounter (HOSPITAL_COMMUNITY)
Admission: EM | Disposition: A | Discharge status: Home Health | Payer: Self-pay | Source: Home / Self Care | Attending: Internal Medicine

## 2021-06-20 DIAGNOSIS — D649 Anemia, unspecified: Secondary | ICD-10-CM | POA: Diagnosis not present

## 2021-06-20 DIAGNOSIS — I330 Acute and subacute infective endocarditis: Secondary | ICD-10-CM | POA: Diagnosis not present

## 2021-06-20 DIAGNOSIS — F419 Anxiety disorder, unspecified: Secondary | ICD-10-CM | POA: Diagnosis not present

## 2021-06-20 DIAGNOSIS — F411 Generalized anxiety disorder: Secondary | ICD-10-CM | POA: Diagnosis not present

## 2021-06-20 DIAGNOSIS — E43 Unspecified severe protein-calorie malnutrition: Secondary | ICD-10-CM | POA: Diagnosis not present

## 2021-06-20 DIAGNOSIS — C329 Malignant neoplasm of larynx, unspecified: Secondary | ICD-10-CM | POA: Diagnosis not present

## 2021-06-20 DIAGNOSIS — J9601 Acute respiratory failure with hypoxia: Secondary | ICD-10-CM | POA: Diagnosis not present

## 2021-06-20 HISTORY — PX: LAPAROSCOPIC INSERTION GASTROSTOMY TUBE: SHX6817

## 2021-06-20 LAB — CBC
HCT: 25.5 % — ABNORMAL LOW (ref 39.0–52.0)
Hemoglobin: 8.2 g/dL — ABNORMAL LOW (ref 13.0–17.0)
MCH: 29.7 pg (ref 26.0–34.0)
MCHC: 32.2 g/dL (ref 30.0–36.0)
MCV: 92.4 fL (ref 80.0–100.0)
Platelets: 223 10*3/uL (ref 150–400)
RBC: 2.76 MIL/uL — ABNORMAL LOW (ref 4.22–5.81)
RDW: 14.6 % (ref 11.5–15.5)
WBC: 5.4 10*3/uL (ref 4.0–10.5)
nRBC: 0 % (ref 0.0–0.2)

## 2021-06-20 LAB — GLUCOSE, CAPILLARY
Glucose-Capillary: 109 mg/dL — ABNORMAL HIGH (ref 70–99)
Glucose-Capillary: 124 mg/dL — ABNORMAL HIGH (ref 70–99)
Glucose-Capillary: 141 mg/dL — ABNORMAL HIGH (ref 70–99)
Glucose-Capillary: 178 mg/dL — ABNORMAL HIGH (ref 70–99)
Glucose-Capillary: 183 mg/dL — ABNORMAL HIGH (ref 70–99)
Glucose-Capillary: 55 mg/dL — ABNORMAL LOW (ref 70–99)
Glucose-Capillary: 57 mg/dL — ABNORMAL LOW (ref 70–99)
Glucose-Capillary: 72 mg/dL (ref 70–99)
Glucose-Capillary: 74 mg/dL (ref 70–99)

## 2021-06-20 SURGERY — INSERTION, GASTROSTOMY TUBE, PERCUTANEOUS
Anesthesia: General | Site: Abdomen

## 2021-06-20 MED ORDER — DEXTROSE 50 % IV SOLN
1.0000 | Freq: Once | INTRAVENOUS | Status: AC
  Administered 2021-06-20: 50 mL via INTRAVENOUS
  Filled 2021-06-20: qty 50

## 2021-06-20 MED ORDER — ONDANSETRON HCL 4 MG/2ML IJ SOLN
INTRAMUSCULAR | Status: DC | PRN
  Administered 2021-06-20: 4 mg via INTRAVENOUS

## 2021-06-20 MED ORDER — PROPOFOL 10 MG/ML IV BOLUS
INTRAVENOUS | Status: DC | PRN
  Administered 2021-06-20: 50 mg via INTRAVENOUS

## 2021-06-20 MED ORDER — EPHEDRINE SULFATE-NACL 50-0.9 MG/10ML-% IV SOSY
PREFILLED_SYRINGE | INTRAVENOUS | Status: DC | PRN
  Administered 2021-06-20: 5 mg via INTRAVENOUS

## 2021-06-20 MED ORDER — MIDAZOLAM HCL 2 MG/2ML IJ SOLN
INTRAMUSCULAR | Status: AC
Start: 2021-06-20 — End: ?
  Filled 2021-06-20: qty 2

## 2021-06-20 MED ORDER — AMISULPRIDE (ANTIEMETIC) 5 MG/2ML IV SOLN: 10.0000 mg | Freq: Once | INTRAVENOUS | Status: DC | PRN

## 2021-06-20 MED ORDER — PHENYLEPHRINE 80 MCG/ML (10ML) SYRINGE FOR IV PUSH (FOR BLOOD PRESSURE SUPPORT)
PREFILLED_SYRINGE | INTRAVENOUS | Status: AC
  Filled 2021-06-20: qty 10

## 2021-06-20 MED ORDER — EPHEDRINE 5 MG/ML INJ
INTRAVENOUS | Status: AC
  Filled 2021-06-20: qty 5

## 2021-06-20 MED ORDER — PROSOURCE TF PO LIQD
45.0000 mL | Freq: Every day | ORAL | Status: DC
  Administered 2021-06-20 – 2021-06-29 (×10): 45 mL
  Filled 2021-06-20 (×9): qty 45

## 2021-06-20 MED ORDER — LIDOCAINE 2% (20 MG/ML) 5 ML SYRINGE
INTRAMUSCULAR | Status: DC | PRN
  Administered 2021-06-20: 40 mg via INTRAVENOUS

## 2021-06-20 MED ORDER — STERILE WATER FOR IRRIGATION IR SOLN
Status: DC | PRN
  Administered 2021-06-20: 1000 mL

## 2021-06-20 MED ORDER — OXYCODONE HCL 5 MG PO TABS: 5.0000 mg | ORAL_TABLET | Freq: Once | ORAL | Status: DC | PRN

## 2021-06-20 MED ORDER — LACTATED RINGERS IV SOLN: INTRAVENOUS | Status: DC | PRN

## 2021-06-20 MED ORDER — OXYCODONE HCL 5 MG/5ML PO SOLN: 5.0000 mg | Freq: Once | ORAL | Status: DC | PRN

## 2021-06-20 MED ORDER — PHENYLEPHRINE HCL-NACL 20-0.9 MG/250ML-% IV SOLN
INTRAVENOUS | Status: DC | PRN
  Administered 2021-06-20: 40 ug/min via INTRAVENOUS

## 2021-06-20 MED ORDER — PHENYLEPHRINE 80 MCG/ML (10ML) SYRINGE FOR IV PUSH (FOR BLOOD PRESSURE SUPPORT)
PREFILLED_SYRINGE | INTRAVENOUS | Status: DC | PRN
  Administered 2021-06-20: 160 ug via INTRAVENOUS
  Administered 2021-06-20: 120 ug via INTRAVENOUS
  Administered 2021-06-20: 80 ug via INTRAVENOUS
  Administered 2021-06-20 (×2): 160 ug via INTRAVENOUS

## 2021-06-20 MED ORDER — LORAZEPAM 2 MG/ML IJ SOLN
0.5000 mg | Freq: Once | INTRAMUSCULAR | Status: AC
  Administered 2021-06-20: 0.5 mg via INTRAVENOUS
  Filled 2021-06-20: qty 1

## 2021-06-20 MED ORDER — PROPOFOL 10 MG/ML IV BOLUS
INTRAVENOUS | Status: AC
  Filled 2021-06-20: qty 20

## 2021-06-20 MED ORDER — SUGAMMADEX SODIUM 200 MG/2ML IV SOLN
INTRAVENOUS | Status: DC | PRN
  Administered 2021-06-20: 100 mg via INTRAVENOUS

## 2021-06-20 MED ORDER — ROCURONIUM BROMIDE 10 MG/ML (PF) SYRINGE
PREFILLED_SYRINGE | INTRAVENOUS | Status: DC | PRN
  Administered 2021-06-20: 10 mg via INTRAVENOUS
  Administered 2021-06-20: 30 mg via INTRAVENOUS

## 2021-06-20 MED ORDER — BUPIVACAINE-EPINEPHRINE 0.25% -1:200000 IJ SOLN
INTRAMUSCULAR | Status: DC | PRN
  Administered 2021-06-20: 19 mL

## 2021-06-20 MED ORDER — BUPIVACAINE-EPINEPHRINE (PF) 0.25% -1:200000 IJ SOLN
INTRAMUSCULAR | Status: AC
  Filled 2021-06-20: qty 30

## 2021-06-20 MED ORDER — FENTANYL CITRATE (PF) 250 MCG/5ML IJ SOLN
INTRAMUSCULAR | Status: AC
  Filled 2021-06-20: qty 5

## 2021-06-20 MED ORDER — FENTANYL CITRATE (PF) 250 MCG/5ML IJ SOLN
INTRAMUSCULAR | Status: DC | PRN
  Administered 2021-06-20: 150 ug via INTRAVENOUS

## 2021-06-20 MED ORDER — HYDROMORPHONE HCL 1 MG/ML IJ SOLN
0.5000 mg | Freq: Once | INTRAMUSCULAR | Status: AC
  Administered 2021-06-20: 0.5 mg via INTRAVENOUS
  Filled 2021-06-20: qty 0.5

## 2021-06-20 MED ORDER — FENTANYL CITRATE (PF) 100 MCG/2ML IJ SOLN: 25.0000 ug | INTRAMUSCULAR | Status: DC | PRN

## 2021-06-20 MED ORDER — 0.9 % SODIUM CHLORIDE (POUR BTL) OPTIME
TOPICAL | Status: DC | PRN
  Administered 2021-06-20: 1000 mL

## 2021-06-20 SURGICAL SUPPLY — 39 items
ADH SKN CLS APL DERMABOND .7 (GAUZE/BANDAGES/DRESSINGS) ×1
APL PRP STRL LF DISP 70% ISPRP (MISCELLANEOUS) ×1
BAG URINE DRAINAGE (UROLOGICAL SUPPLIES) IMPLANT
BLADE CLIPPER SURG (BLADE) ×1 IMPLANT
CANISTER SUCT 3000ML PPV (MISCELLANEOUS) ×2 IMPLANT
CHLORAPREP W/TINT 26 (MISCELLANEOUS) ×2 IMPLANT
COVER SURGICAL LIGHT HANDLE (MISCELLANEOUS) ×2 IMPLANT
DERMABOND ADVANCED (GAUZE/BANDAGES/DRESSINGS) ×1
DERMABOND ADVANCED .7 DNX12 (GAUZE/BANDAGES/DRESSINGS) ×1 IMPLANT
DRAPE LAPAROSCOPIC ABDOMINAL (DRAPES) ×2 IMPLANT
ELECT REM PT RETURN 9FT ADLT (ELECTROSURGICAL) ×2
ELECTRODE REM PT RTRN 9FT ADLT (ELECTROSURGICAL) ×1 IMPLANT
GOWN STRL REUS W/ TWL LRG LVL3 (GOWN DISPOSABLE) ×3 IMPLANT
GOWN STRL REUS W/TWL LRG LVL3 (GOWN DISPOSABLE) ×4
GRASPER SUT TROCAR 14GX15 (MISCELLANEOUS) ×1 IMPLANT
KIT BASIN OR (CUSTOM PROCEDURE TRAY) ×2 IMPLANT
KIT TURNOVER KIT B (KITS) ×2 IMPLANT
NDL INSUFFLATION 14GA 120MM (NEEDLE) IMPLANT
NEEDLE 22X1 1/2 (OR ONLY) (NEEDLE) ×2 IMPLANT
NEEDLE INSUFFLATION 14GA 120MM (NEEDLE) ×2 IMPLANT
NS IRRIG 1000ML POUR BTL (IV SOLUTION) ×2 IMPLANT
PAD ARMBOARD 7.5X6 YLW CONV (MISCELLANEOUS) ×4 IMPLANT
SCISSORS LAP 5X35 DISP (ENDOMECHANICALS) ×1 IMPLANT
SET TUBE SMOKE EVAC HIGH FLOW (TUBING) ×2 IMPLANT
SLEEVE ADV FIXATION 5X100MM (TROCAR) ×4 IMPLANT
SLEEVE ENDOPATH XCEL 5M (ENDOMECHANICALS) ×1 IMPLANT
STRIP CLOSURE SKIN 1/2X4 (GAUZE/BANDAGES/DRESSINGS) ×1 IMPLANT
SUT ETHILON 2 0 FS 18 (SUTURE) ×2 IMPLANT
SUT MNCRL AB 4-0 PS2 18 (SUTURE) ×1 IMPLANT
SUT SILK 0 SH 30 (SUTURE) ×2 IMPLANT
SUT VIC AB 4-0 PS2 27 (SUTURE) ×2 IMPLANT
TOWEL GREEN STERILE (TOWEL DISPOSABLE) ×2 IMPLANT
TOWEL GREEN STERILE FF (TOWEL DISPOSABLE) ×2 IMPLANT
TRAY LAPAROSCOPIC MC (CUSTOM PROCEDURE TRAY) ×2 IMPLANT
TROCAR XCEL BLUNT TIP 100MML (ENDOMECHANICALS) ×2 IMPLANT
TROCAR XCEL NON-BLD 5MMX100MML (ENDOMECHANICALS) ×2 IMPLANT
TROCAR Z-THREAD FIOS 11X100 BL (TROCAR) ×1 IMPLANT
TUBE GASTROSTOMY 18F (CATHETERS) ×1 IMPLANT
WATER STERILE IRR 1000ML POUR (IV SOLUTION) ×2 IMPLANT

## 2021-06-20 NOTE — Transfer of Care (Signed)
Immediate Anesthesia Transfer of Care Note  Patient: David [name]  Procedure(s) Performed: LAPAROSCOPIC INSERTION GASTROSTOMY TUBE (Abdomen)  Patient Location: PACU  Anesthesia Type:General  Level of Consciousness: drowsy, patient cooperative and responds to stimulation  Airway & Oxygen Therapy: Patient Spontanous Breathing and Patient connected to tracheostomy mask oxygen  Post-op Assessment: Report given to RN and Post -op Vital signs reviewed and stable  Post vital signs: Reviewed and stable  Last Vitals:  Vitals Value Taken Time  BP 83/58 06/20/21 0901  Temp    Pulse 75 06/20/21 0903  Resp 18 06/20/21 0903  SpO2 92 % 06/20/21 0903  Vitals shown include unvalidated device data.  Last Pain:  Vitals:   06/20/21 0600  TempSrc: Oral  PainSc:       Patients Stated Pain Goal: 0 (30/05/11 0211)  Complications: No notable events documented.

## 2021-06-20 NOTE — Progress Notes (Signed)
Due to pt being in OR at this time, routine tracheostomy check will resume during 1200 rounds.

## 2021-06-20 NOTE — Progress Notes (Signed)
PT Cancellation Note  Patient Details Name: David [name] MRN: [id number] DOB: January 03, 1976   Cancelled Treatment:    Reason Eval/Treat Not Completed: (P) Patient at procedure or test/unavailable (pt off unit at OR for G-tube placement in AM, will continue efforts per PT plan of care as schedule permits.)   Carlene Coria 06/20/2021, 11:55 AM

## 2021-06-20 NOTE — Anesthesia Postprocedure Evaluation (Signed)
Anesthesia Post Note  Patient: David [name]  Procedure(s) Performed: LAPAROSCOPIC INSERTION GASTROSTOMY TUBE (Abdomen)     Patient location during evaluation: PACU Anesthesia Type: General Level of consciousness: awake Pain management: pain level controlled Vital Signs Assessment: post-procedure vital signs reviewed and stable Respiratory status: spontaneous breathing, nonlabored ventilation, respiratory function stable and patient connected to T-piece oxygen Cardiovascular status: blood pressure returned to baseline and stable Postop Assessment: no apparent nausea or vomiting Anesthetic complications: no   No notable events documented.  Last Vitals:  Vitals:   06/20/21 1930 06/20/21 1947  BP: (!) 110/54 (!) 110/54  Pulse: 71 66  Resp: 18 18  Temp: 37.1 C   SpO2: 100% 94%    Last Pain:  Vitals:   06/20/21 1930  TempSrc: Oral  PainSc:                  David [name]

## 2021-06-20 NOTE — TOC Progression Note (Signed)
Transition of Care [facility]) - Progression Note    Patient Details  Name: David [name] MRN: [id number] Date of Birth: 08/31/1975  Transition of Care [facility]) CM/SW Contact  Carles Collet, RN Phone Number: 06/20/2021, 4:29 PM  Clinical Narrative:      [facility] trach division liaison, [name] [phone],  verified that referral received and he will be able to line up [facility] RN for 8-10 hours a day for the patient. He will email me orders and forms tomorrow and then this will take 1-2 weeks to get approved through medicare and get him on the schedule. Spoke to patient's mom [name], and updated her level of assistance that will be provided after DC. Explained to her that SNF choices would be very limited if any would agree to take him with methadone as this is usually a disqualifier for most facilities and choices are further limited with Trach.  She understood and was agreeable to take him back home. She voiced concern over their relationship not being good, and agreed for me to talk to him about him allowing her to help more and the way he talks to her. [name] was receptive to understanding the level of help that his mom was agreeable to provide and his acknowledged how limited his resources were and options were outside of her help. We discussed them both having more patience for each other and he nodded that he agreed they would be able to live together after DC.  He understands that I have encouraged her to come daily and have a more hands-on approach to assisting in his care in order to be able to take him home. Address verified.   DME Patient will need trach orders through [employer] and at least 24 hour notice prior to DC Patient will need tube feeding orders as well.   Transportation Anticipate patient will need PTAR due to trach, weakness and cachexia.     Expected Discharge Plan: [place] Barriers to Discharge: Continued Medical Work up  Expected Discharge  Plan and Services Expected Discharge Plan: [place]                                               Social Determinants of Health (SDOH) Interventions    Readmission Risk Interventions     View : No data to display.

## 2021-06-20 NOTE — Progress Notes (Signed)
Speech Language Pathology Treatment: Dysphagia  Patient Details Name: David [name] MRN: [id number] DOB: 1975-10-08 Today's Date: 06/20/2021 Time: 9449-6759 SLP Time Calculation (min) (ACUTE ONLY): 12 min  Assessment / Plan / Recommendation Clinical Impression  Patient seen by SLP for skilled treatment focused on dysphagia goals. Patient in bed but awake and agreeable to eating some of his lunch. He was able to feed self regular texture solids without any overt s/s aspiration or penetration and no observed difficulty. He did not consume any liquids while SLP in room. As he currently has NG removed and got PEG today, SLP recommending repeat MBS to determine if any benefit to his swallow without NG present. Patient in agreement to do this test tomorrow. SLP will check in with patient tomorrow morning to ensure he is still agreeable to this.    HPI HPI: 46yo male with past medical history of laryngeal cancer s/p chemo and radiation and heroin abuse presented to ED on 06/06/21 from home with complaints of shortness of breath and not being able to eat for several days. Patient was admitted on 06/06/2021 with acute hypoxic respiratory failure with sepsis secondary to aspiration pneumonia, elevated D-dimer, AKI. Developed worsening respiratory distress requiring emergent tracheostomy on 5/13. Patient got PEG on 06/20/21.      SLP Plan  Continue with current plan of care      Recommendations for follow up therapy are one component of a multi-disciplinary discharge planning process, led by the attending physician.  Recommendations may be updated based on patient status, additional functional criteria and insurance authorization.    Recommendations  Diet recommendations: Regular;Honey-thick liquid Liquids provided via: Cup Medication Administration: Whole meds with puree Supervision: Patient able to self feed Compensations: Small sips/bites;Slow rate;Clear throat intermittently Postural Changes  and/or Swallow Maneuvers: Seated upright 90 degrees                Oral Care Recommendations: Oral care QID Follow Up Recommendations: Outpatient SLP Assistance recommended at discharge: Frequent or constant Supervision/Assistance SLP Visit Diagnosis: Dysphagia, pharyngeal phase (R13.13) Plan: Continue with current plan of care          Sonia Baller, MA, CCC-SLP Speech Therapy

## 2021-06-20 NOTE — Anesthesia Preprocedure Evaluation (Addendum)
Anesthesia Evaluation    Reviewed: Allergy & Precautions, Patient's Chart, lab work & pertinent test results  Airway Mallampati: Trach       Dental   Pulmonary Current Smoker and Patient abstained from smoking.,  TRACHEOSTOMY   tracheostomy  breath sounds clear to auscultation       Cardiovascular negative cardio ROS Normal cardiovascular exam     Neuro/Psych Anxiety negative neurological ROS     GI/Hepatic negative GI ROS, (+)     substance abuse  ,   Endo/Other  negative endocrine ROS  Renal/GU negative Renal ROS     Musculoskeletal negative musculoskeletal ROS (+)   Abdominal   Peds  Hematology  (+) Blood dyscrasia, anemia ,   Anesthesia Other Findings PROTEIN CALORIE MALNUTRITION  Reproductive/Obstetrics                           Anesthesia Physical Anesthesia Plan  ASA: 3  Anesthesia Plan: General   Post-op Pain Management:    Induction: Intravenous  PONV Risk Score and Plan: 1 and Ondansetron, Dexamethasone, Midazolam and Treatment may vary due to age or medical condition  Airway Management Planned: Tracheostomy  Additional Equipment:   Intra-op Plan:   Post-operative Plan: Possible Post-op intubation/ventilation  Informed Consent:   Plan Discussed with:   Anesthesia Plan Comments:         Anesthesia Quick Evaluation

## 2021-06-20 NOTE — Progress Notes (Signed)
Hospital day: 14  Subjective:   Overnight events: Hypoglycemic to 57 at midnight and 55 at 6 AM, given D50 x2. Worsening pain and npo so received dilaudid 0.5 mg. Anxious about procedure, gave IV Ativan 0.5 mg.   Patient seen at bedside during morning rounds shortly after receiving PEG tube. Patient appears satisfied with the procedure, states he is only experiencing a little pain at the surgical site.   Objective:  Vital signs in last 24 hours: Vitals:   06/20/21 0000 06/20/21 0010 06/20/21 0200 06/20/21 0330  BP: (!) 84/64 (!) 84/64 (!) 80/61   Pulse: 64 73 65 75  Resp: '13 19 16 '$ (!) 22  Temp: 98.1 F (36.7 C)  97.9 F (36.6 C)   TempSrc: Oral  Oral   SpO2: 93% 99% 99% 96%  Weight:      Height:        Filed Weights   06/08/21 0500 06/09/21 0500 06/12/21 0500  Weight: 38.2 kg 41.8 kg 42.1 kg     Intake/Output Summary (Last 24 hours) at 06/20/2021 0644 Last data filed at 06/19/2021 0645 Gross per 24 hour  Intake --  Output 200 ml  Net -200 ml   Net IO Since Admission: -956.07 mL [06/20/21 0644]   Pertinent Labs:    Latest Ref Rng & Units 06/20/2021    1:37 AM 06/19/2021    2:01 AM 06/18/2021   10:27 AM  CBC  WBC 4.0 - 10.5 K/uL 5.4   6.4   12.8    Hemoglobin 13.0 - 17.0 g/dL 8.2   8.7   9.2    Hematocrit 39.0 - 52.0 % 25.5   26.5   28.6    Platelets 150 - 400 K/uL 223   247   258         Latest Ref Rng & Units 06/17/2021    1:08 AM 06/15/2021    1:27 AM 06/14/2021    2:40 AM  CMP  Glucose 70 - 99 mg/dL 110   82   116    BUN 6 - 20 mg/dL '11   14   14    '$ Creatinine 0.61 - 1.24 mg/dL 0.46   0.43   0.45    Sodium 135 - 145 mmol/L 134   130   130    Potassium 3.5 - 5.1 mmol/L 4.4   4.5   5.4    Chloride 98 - 111 mmol/L 98   94   97    CO2 22 - 32 mmol/L '31   29   24    '$ Calcium 8.9 - 10.3 mg/dL 8.0   8.1   8.1      Recent Labs    06/20/21 0720 06/20/21 0909 06/20/21 1211  GLUCAP 183* 141* 72     Imaging: No results found.  Physical  Exam  General: Cachectic, chronically-ill appearing male  HEENT: Atraumatic, TC present CV: RRR, no murmurs heard Pulmonary: Breathing comfortable on 2L  Abdominal: Soft, g-tube site with minimal amount of blood, +BS Extremities: No LE edema Skin: Warm, dry, no rashes seen Neuro: A&Ox3 Psych: Less depressed appearing  Assessment/Plan: David [name] is a 46 y.o. 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 Hospital course has been complicated by ICU admission requiring tracheostomy, bacteremia, concern for endocarditis, and persistent hypotension.   Principal Problem:   Aspiration pneumonia of right lower lobe ([place]) Active  Problems:   Laryngeal cancer (HCC)   Goals of care, counseling/discussion   Protein-calorie malnutrition, severe   Drug abuse and dependence ([place])   Community acquired pneumonia due to Pneumococcus [facility])   Anxiety state   Pneumococcal bacteremia   Supraglottic airway obstruction   Chronic HFrEF (heart failure with reduced ejection fraction) (HCC)   Tracheostomy status (HCC)   Acute respiratory failure with hypoxia (HCC)   Bacterial endocarditis   Pressure injury of skin  # Acute respiratory failure with hypoxia # s/p tracheostomy placement # Aspiration pneumonia, treated Tracheostomy placed 5/13 for respiratory compromise in ICU. Still making significant secretions. Completed 5- day course of Flagyl and Rocephin on 5/21 for aspiration pneumonia. Tolerated O2 wean from 5L to 2L this morning, will re-consult Dr. Redmond Baseman with ENT who placed trach about next steps and potentially removing trach.  - ENT consulted, appreciate recs  - Wean O2 as tolerated  - Routine trach care - Guaifenesin 5 mL q6h PRN secretions  - Continuous pulse ox monitoring    # Endocarditis  # Strep pneumo bacteremia, resolved  WBC wnl at 5.4. TTE with thickened  anterior mitral leaflet with possible small hypermobile density concerning for vegetation. Per Cardiology, patient not a candidate for TEE given his history of radiation for laryngeal cancer. Patient remains afebrile, plan to continue empiric treatment for endocarditis based on TTE findings and history of recent IVDU. Unfortunately we have encountered multiple barriers to placement at SNF or LTAC. Discussed with ID yesterday about oral alternatives for patient to complete antibiotic course at home but standard of treatment for Strep bacteremia with concern for endocarditis is IV antibiotics. Will reduce labs to 2x/week and stop overnight vitals to try to make patient's stay more comfortable.  - IV Ceftriaxone x 4 weeks (end date 07/06/21) - Reduce CBC to 2x week - Stop overnight vital checks  # Hypotension  SBP 80-100s, continuing to closely monitor.  - Midodrine 10 mg TID  - IVF PRN to keep MAP > 60   # Protein-calorie malnutrition, severe PEG tube successfully placed today. Will re-consult RD to assist with nutrition and follow-up with SLP about re-eval.  - Gen Surg consulted, appreciate assistance - RD consulted, appreciate assistance  - Nectar thick liquids per SLP recs  - SLP to re-evaluate  - CBG q4h   # Normocytic anemia, stable  Hgb 8.2. Will continue to monitor.  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  - Methadone 50 mg daily  - Nicotine patch daily    # Chronic pain  Continues to complain of significant pain. Pain management with opioids and benzodiazepines limited by persistent hypotension.   - Ibuprofen 600 mg q6 hours  - Tylenol 1000 mg TID - Duloxetine 30 mg daily  - Oxycodone 5 mg q6h PRN mod/severe pain  - Lidocaine patch daily  - Exedrin tab q6h PRN headache    # Anxiety, Depression  # Goals of care Patient  appeared less depressed today. PEG tube successfully placed. Will plan to continue full code/full scope of care and do what we can to improve patient's comfort while he's here.  - Hydroxyzine 50 mg q6h PRN anxiety  - Duloxetine 30 mg daily  - Palliative Care consulted, appreciate assistance    # Hepatitis C  HCV RNA 792,000 on admission, indicative of  chronic infection. ID recommended outpatient treatment.   Diet: Dysphagia 3, PEG tube  IVF: None VTE: Lovenox CODE: Full  Prior to Admission Living Arrangement: Home with mother  Anticipated Discharge Location: Home with mother  Barriers to Discharge:  4-week course of IV antibiotics Dispo: Continue inpatient admission   Signed: Sabino Dick, [facility] Internal Medicine Teaching Service [phone] Please contact the on call pager after 5 pm and on weekends at [phone].

## 2021-06-20 NOTE — Interval H&P Note (Signed)
History and Physical Interval Note:  06/20/2021 7:18 AM  David [name]  has presented today for surgery, with the diagnosis of PROTEIN PROTEIN CALORIE MALNUTRITION.  The various methods of treatment have been discussed with the patient and family. After consideration of risks, benefits and other options for treatment, the patient has consented to  Procedure(s) with comments: [place] (N/A) - DOW PATIENT as a surgical intervention.  The patient's history has been reviewed, patient examined, no change in status, stable for surgery.  I have reviewed the patient's chart and labs.  Questions were answered to the patient's satisfaction.     [name] Rich Brave

## 2021-06-20 NOTE — Op Note (Signed)
Operative Note  David [name]  [id number]  [id number]  06/20/2021   Surgeon: Romana Juniper MD  Assistant: Ewell Poe RNFA   Procedure performed: laparoscopic gastrostomy tube   Preop diagnosis: severe protein calorie malnutrition Post-op diagnosis/intraop findings: same   Specimens: no Retained items: 37f mic gastrostomy tube left upper quadrant EBL: minimal cc Complications: none   Description of procedure: After obtaining informed consent the patient was taken to the operating room and placed supine on operating room table where general anesthesia was initiated, preoperative antibiotics were administered, SCDs applied, and a formal timeout was performed. The abdomen was prepped and draped in the usual sterile fashion. Peritoneal access was gained with an infraumbilical veress needle and insufflation to 117mg proceeded without incident. A 70m52mrocar and camera were then inserted and gross inspection demonstrated thin omental adhesions to the umbilical and lower abdominal wall, but no injury from entry or other gross abnormalities. After infiltration with local a right sided 14m59md 70mm 49mcar were placed. A site was selected on the left upper quadrant abdominal wall and incision made for the g tube, as well as 4 small stab incisions for the stay sutures. A site on the greater curvature of the stomach which easily reached the abdominal wall was selected and 4 stay sutures of 0 silk were placed and these were brought through the abdominal wall with the PMI for transfascial gastropexy surrounding the gastrotomy. The gastrotomy was made with cautery and slightly bluntly dilated. An 18fr 37fgastrostomy was inserted through the previously made left upper quadrant incision into the stomach after testing the balloon and confirming no balloon leak. The balloon was filled with 9mL of46mter. The transfacial sutures were tied down and the tube was gently pulled back so that the balloon rests  loosely against the abdominal wall. The phlange sits at 2cm leaving some laxity between the phlange and the skin. This phlange is secured with 3 2-0 nylons. The 14mm tr38m site fascia is closed with a 0 vicryl using the PMI. The abdomen was reinspected and confirmed to be free of injury and hemostasis is excellent. The abdomen was desufflated and remaining trocars removed; skin incisions closed with subcuticular 4-0 monocryl and dermabond. The patient was then awakened and taken to PACU in stable condition.    All counts were correct at the completion of the case.

## 2021-06-21 ENCOUNTER — Encounter (HOSPITAL_COMMUNITY): Payer: Self-pay | Admitting: Surgery

## 2021-06-21 ENCOUNTER — Inpatient Hospital Stay (HOSPITAL_COMMUNITY): Payer: Medicaid Other

## 2021-06-21 DIAGNOSIS — M79604 Pain in right leg: Secondary | ICD-10-CM | POA: Diagnosis not present

## 2021-06-21 DIAGNOSIS — F411 Generalized anxiety disorder: Secondary | ICD-10-CM | POA: Diagnosis not present

## 2021-06-21 DIAGNOSIS — I330 Acute and subacute infective endocarditis: Secondary | ICD-10-CM | POA: Diagnosis not present

## 2021-06-21 DIAGNOSIS — J9601 Acute respiratory failure with hypoxia: Secondary | ICD-10-CM | POA: Diagnosis not present

## 2021-06-21 DIAGNOSIS — J690 Pneumonitis due to inhalation of food and vomit: Secondary | ICD-10-CM | POA: Diagnosis not present

## 2021-06-21 DIAGNOSIS — I5022 Chronic systolic (congestive) heart failure: Secondary | ICD-10-CM | POA: Diagnosis not present

## 2021-06-21 LAB — GLUCOSE, CAPILLARY
Glucose-Capillary: 102 mg/dL — ABNORMAL HIGH (ref 70–99)
Glucose-Capillary: 104 mg/dL — ABNORMAL HIGH (ref 70–99)
Glucose-Capillary: 93 mg/dL (ref 70–99)
Glucose-Capillary: 71 mg/dL (ref 70–99)
Glucose-Capillary: 98 mg/dL (ref 70–99)

## 2021-06-21 LAB — CBC
HCT: 24.6 % — ABNORMAL LOW (ref 39.0–52.0)
Hemoglobin: 8.1 g/dL — ABNORMAL LOW (ref 13.0–17.0)
MCH: 30.3 pg (ref 26.0–34.0)
MCHC: 32.9 g/dL (ref 30.0–36.0)
MCV: 92.1 fL (ref 80.0–100.0)
Platelets: 212 10*3/uL (ref 150–400)
RBC: 2.67 MIL/uL — ABNORMAL LOW (ref 4.22–5.81)
RDW: 14.6 % (ref 11.5–15.5)
WBC: 4.9 10*3/uL (ref 4.0–10.5)
nRBC: 0 % (ref 0.0–0.2)

## 2021-06-21 NOTE — Progress Notes (Signed)
PT Cancellation Note  Patient Details Name: David [name] MRN: [id number] DOB: 1975-12-28   Cancelled Treatment:    Reason Eval/Treat Not Completed: Patient declined, no reason specified  Patient adamant that he will not participate with exercises or mobility. Asking for methadone and RN reported he has already had it. Patient educated on benefits of mobility and tried to discuss discharge plans to his mother's, however pt becoming more upset and "not doing a damn thing." Will continue attempts and appreciate all providers trying to impress upon him the need for increased activity.   [name], PT Acute Rehabilitation Services  Pager [phone] Office [phone]   [name] 06/21/2021, 10:55 AM

## 2021-06-21 NOTE — Progress Notes (Signed)
Hospital day: 15  Subjective:   Overnight events: No acute events.  Patient seen at bedside during morning rounds. Notes continuous dull pain in his R calf that has been present for 4-5 days. Denies pain at PEG-tube site or issues with tube feeds.   Objective:  Vital signs in last 24 hours: Vitals:   06/20/21 2333 06/20/21 2348 06/21/21 0245 06/21/21 0309  BP: (!) 106/55 (!) 106/55  (!) 103/50  Pulse: 77 73 69 72  Resp: 20 17 (!) 21 19  Temp: 98.1 F (36.7 C)   98 F (36.7 C)  TempSrc: Oral   Oral  SpO2: 92% 95% 99% 100%  Weight:      Height:        Filed Weights   06/08/21 0500 06/09/21 0500 06/12/21 0500  Weight: 38.2 kg 41.8 kg 42.1 kg     Intake/Output Summary (Last 24 hours) at 06/21/2021 0754 Last data filed at 06/21/2021 0646 Gross per 24 hour  Intake 620 ml  Output 575 ml  Net 45 ml   Net IO Since Admission: -911.07 mL [06/21/21 0754]   Pertinent Labs:    Latest Ref Rng & Units 06/21/2021    4:22 AM 06/20/2021    1:37 AM 06/19/2021    2:01 AM  CBC  WBC 4.0 - 10.5 K/uL 4.9   5.4   6.4    Hemoglobin 13.0 - 17.0 g/dL 8.1   8.2   8.7    Hematocrit 39.0 - 52.0 % 24.6   25.5   26.5    Platelets 150 - 400 K/uL 212   223   247         Latest Ref Rng & Units 06/17/2021    1:08 AM 06/15/2021    1:27 AM 06/14/2021    2:40 AM  CMP  Glucose 70 - 99 mg/dL 110   82   116    BUN 6 - 20 mg/dL '11   14   14    '$ Creatinine 0.61 - 1.24 mg/dL 0.46   0.43   0.45    Sodium 135 - 145 mmol/L 134   130   130    Potassium 3.5 - 5.1 mmol/L 4.4   4.5   5.4    Chloride 98 - 111 mmol/L 98   94   97    CO2 22 - 32 mmol/L '31   29   24    '$ Calcium 8.9 - 10.3 mg/dL 8.0   8.1   8.1      Recent Labs    06/20/21 1953 06/20/21 2341 06/21/21 0335  GLUCAP 109* 124* 102*     Imaging: No results found.  Physical Exam  General: Cachectic, tired appearing male HEENT: Normocephalic, atraumatic, TC present CV: RRR, no murmurs heard  Pulmonary: Breathing comfortably on RA   Abdominal: Soft, nontender Extremities: No LE edema Skin: Warm, dry Neuro: A&Ox3 Psych: Withdrawn, depressed  Assessment/Plan: Ramel Tobon is a 46 y.o. 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 Hospital course has been complicated by ICU admission requiring tracheostomy, bacteremia, concern for endocarditis, and persistent hypotension.    Principal Problem:   Acute respiratory failure with hypoxia (HCC) Active Problems:   Laryngeal cancer (HCC)   Goals of care, counseling/discussion   Protein-calorie malnutrition, severe   Drug abuse and dependence ([place])   Community acquired pneumonia due to Pneumococcus ([place])  Anxiety state   Aspiration pneumonia of right lower lobe (HCC)   Pneumococcal bacteremia   Supraglottic airway obstruction   Chronic HFrEF (heart failure with reduced ejection fraction) (HCC)   Tracheostomy status (HCC)   Bacterial endocarditis   Pressure injury of skin  # Acute respiratory failure with hypoxia # s/p tracheostomy placement # Aspiration pneumonia, treated Tracheostomy placed 5/13 for respiratory compromise in ICU. Still making significant secretions. Completed 5- day course of Flagyl and Rocephin on 5/21 for aspiration pneumonia. Tolerated O2 wean from 5L to 2L yesterday morning, completely turned off supplemental O2 this morning and O2 sats remained 97-100%. Left message for Dr. Redmond Baseman with ENT yesterday, called back today and spoke with admin assistant to pass along message to Dr. Redmond Baseman about next steps to capping and/or removing trach.  - ENT consulted, appreciate recs  - Routine trach care - Guaifenesin 5 mL q6h PRN secretions  - Continuous pulse ox monitoring    # Endocarditis  # Strep pneumo bacteremia, resolved  WBC wnl at 4.6. TTE with thickened anterior mitral leaflet with possible small hypermobile density concerning  for vegetation. Per Cardiology, patient not a candidate for TEE given his history of radiation for laryngeal cancer. Patient remains afebrile, plan to continue empiric treatment for endocarditis based on TTE findings and history of recent IVDU. Unfortunately we have encountered multiple barriers to placement at SNF or LTAC. Discussed with ID yesterday about oral alternatives for patient to complete antibiotic course at home but standard of treatment for Strep bacteremia with concern for endocarditis is IV antibiotics.  - IV Ceftriaxone x 4 weeks (end date 07/06/21) - Reduce CBC to 2x week - Stop overnight vital checks  # R calf pain  Patient notes persistent, dull R calf pain for the past 4-5 days. No edema or increased temperature noted on exam. Not painful to palpation. Will order LE [name] to assess for DVT.  - f/u LE [name] results    # Hypotension  SBP slightly improved to 90-100s, continuing to closely monitor.  - Midodrine 10 mg TID  - IVF PRN to keep MAP > 60   # Protein-calorie malnutrition, severe PEG tube successfully placed on 5/24. Repeat MBBS planned for today. Will reach out to RD about changing feeds from continuous to bolus.  - Gen Surg consulted, appreciate assistance - RD consulted, appreciate assistance  - Dysphagia 3 per updated SLP recs  - f/u repeat MBBS  - CBG q4h   # Normocytic anemia, stable  Hgb 8.1. Will continue to monitor.  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  - Methadone 50 mg daily  - Nicotine patch daily    # Chronic pain  Continues to complain of significant pain. Pain management with opioids and benzodiazepines limited by persistent hypotension.   - Ibuprofen 600 mg q6 hours  - Tylenol 1000 mg TID - Duloxetine 30 mg daily  - Oxycodone 5 mg q6h PRN mod/severe pain  - Lidocaine patch daily  - Exedrin tab  q6h PRN headache    # Anxiety, Depression  # Goals of care Continue full code/full scope of care and do what we can to improve patient's comfort while he's here.  - Hydroxyzine 50 mg q6h PRN anxiety  - Duloxetine 30 mg daily  - Palliative Care consulted, appreciate assistance    #  Hepatitis C  HCV RNA 792,000 on admission, indicative of chronic infection. ID recommended outpatient treatment.  Diet: Dysphagia 3, PEG tube feeds IVF: None VTE: Lovenox CODE: Full  Prior to Admission Living Arrangement: Home Anticipated Discharge Location: Home Barriers to Discharge: Medical stability  Dispo: Anticipated discharge on 07/06/21  Signed: Sabino Dick, MS4 Internal Medicine Teaching Service [phone] Please contact the on call pager after 5 pm and on weekends at [phone].

## 2021-06-21 NOTE — Progress Notes (Signed)
Progress Note  1 Day Post-Op  Subjective: Pt reports pain at g-tube site but appears to be tolerating TF well. No flatus yet but not mobilizing much.   Objective: Vital signs in last 24 hours: Temp:  [97.3 F (36.3 C)-98.8 F (37.1 C)] 97.3 F (36.3 C) (05/25 0811) Pulse Rate:  [66-104] 71 (05/25 0811) Resp:  [14-21] 17 (05/25 0811) BP: (102-110)/(50-59) 108/59 (05/25 0811) SpO2:  [92 %-100 %] 100 % (05/25 0811) FiO2 (%):  [28 %] 28 % (05/25 0811) Last BM Date : 06/16/21  Intake/Output from previous day: 05/24 0701 - 05/25 0700 In: 620 [P.O.:120; I.V.:500] Out: 575 [Urine:575] Intake/Output this shift: Total I/O In: 240 [P.O.:240] Out: -   PE: General: WD, cachectic male who is laying in bed in NAD Heart: regular, rate, and rhythm.   Lungs: Trach present.  Respiratory effort nonlabored Abd: soft, appropriately ttp, ND, +BS, g-tube present with some dried blood around and sutures present. Incisions C/D/I Skin: warm and dry with no masses, lesions, or rashes Psych: A&Ox3 with an angry affect.   Lab Results:  Recent Labs    06/20/21 0137 06/21/21 0422  WBC 5.4 4.9  HGB 8.2* 8.1*  HCT 25.5* 24.6*  PLT 223 212   BMET No results for input(s): NA, K, CL, CO2, GLUCOSE, BUN, CREATININE, CALCIUM in the last 72 hours. PT/INR No results for input(s): LABPROT, INR in the last 72 hours. CMP     Component Value Date/Time   NA 134 (L) 06/17/2021 0108   K 4.4 06/17/2021 0108   CL 98 06/17/2021 0108   CO2 31 06/17/2021 0108   GLUCOSE 110 (H) 06/17/2021 0108   BUN 11 06/17/2021 0108   CREATININE 0.46 (L) 06/17/2021 0108   CREATININE 0.59 (L) 05/15/2021 1339   CALCIUM 8.0 (L) 06/17/2021 0108   PROT 4.9 (L) 06/08/2021 0418   ALBUMIN <1.5 (L) 06/08/2021 0418   AST 42 (H) 06/08/2021 0418   AST 88 (H) 03/30/2021 0856   ALT 21 06/08/2021 0418   ALT 39 03/30/2021 0856   ALKPHOS 50 06/08/2021 0418   BILITOT 0.4 06/08/2021 0418   BILITOT 0.6 03/30/2021 0856   GFRNONAA  >60 06/17/2021 0108   GFRNONAA >60 05/15/2021 1339   GFRAA  01/25/2008 2040    >60        The eGFR has been calculated using the MDRD equation. This calculation has not been validated in all clinical   Lipase  No results found for: LIPASE     Studies/Results: No results found.  Anti-infectives: Anti-infectives (From admission, onward)    Start     Dose/Rate Route Frequency Ordered Stop   06/18/21 0900  ceFAZolin (ANCEF) IVPB 2g/100 mL premix        2 g 200 mL/hr over 30 Minutes Intravenous To Radiology 06/18/21 0807 06/18/21 0935   06/14/21 0900  cefTRIAXone (ROCEPHIN) 2 g in sodium chloride 0.9 % 100 mL IVPB        2 g 200 mL/hr over 30 Minutes Intravenous Every 24 hours 06/14/21 0805     06/12/21 2200  metroNIDAZOLE (FLAGYL) 50 mg/ml oral suspension 500 mg        500 mg Per Tube 2 times daily 06/12/21 1512 06/17/21 0857   06/07/21 1000  cefTRIAXone (ROCEPHIN) 2 g in sodium chloride 0.9 % 100 mL IVPB        2 g 200 mL/hr over 30 Minutes Intravenous Every 24 hours 06/07/21 0815 06/13/21 1230   06/06/21 2030  Ampicillin-Sulbactam (UNASYN) 3 g in sodium chloride 0.9 % 100 mL IVPB  Status:  Discontinued        3 g 200 mL/hr over 30 Minutes Intravenous Every 8 hours 06/06/21 2026 06/07/21 0815   06/06/21 1100  Ampicillin-Sulbactam (UNASYN) 3 g in sodium chloride 0.9 % 100 mL IVPB        3 g 200 mL/hr over 30 Minutes Intravenous  Once 06/06/21 1045 06/06/21 1534   06/06/21 1100  doxycycline (VIBRAMYCIN) 100 mg in sodium chloride 0.9 % 250 mL IVPB  Status:  Discontinued        100 mg 125 mL/hr over 120 Minutes Intravenous Every 12 hours 06/06/21 1045 06/07/21 0815        Assessment/Plan Severe protein calorie malnutrition POD1 s/p laparoscopic gastrostomy placement - tolerating TF - needs to mobilize as able - recommend suture removal from bumper around g-tube in 1 week - follow up [facility] office prn for issues with g-tube but no specific follow up recommended otherwise -  general surgery will sign off, please call if we can be of further assistance    FEN - D3 diet with supplemental TF VTE - ok to resume xarelto  ID - rocephin for bacteremia per primary team  LOS: 15 days     Norm Parcel, [facility] Surgery 06/21/2021, 11:15 AM Please see Amion for pager number during day hours 7:00am-4:30pm

## 2021-06-21 NOTE — Progress Notes (Signed)
Speech Language Pathology Treatment: [name] Speaking valve  Patient Details Name: David [name] MRN: [id number] DOB: May 06, 1975 Today's Date: 06/21/2021 Time: 1660-6301 SLP Time Calculation (min) (ACUTE ONLY): 15 min  Assessment / Plan / Recommendation Clinical Impression  Patient seen by SLP for skilled treatment focused on PMV usage and toleration and brief amount of patient discussion/education. Patient in bed with eyes closed by was awake and interactive. Trach cuff was inflated and after checking with patient's RN to ensure no changes in trach recommendations, SLP deflated trach cuff which patient tolerated without any overt s/s difficulty. Patient then agreeable to SLP placing PMV. He immediately had some throat clearing and mild amount of voicing when doing so. Throat clearing was productive and patient independently orally suctioned himself. PMV then removed as per patient request. When SLP then discussing plan for repeat MBS, patient shook head and mouthed 'no'. SLP asked again to confirm his response. SLP will continue to follow patient for willingness to participate in repeat MBS.     HPI: 46yo male with past medical history of laryngeal cancer s/p chemo and radiation and heroin abuse presented to ED on 06/06/21 from home with complaints of shortness of breath and not being able to eat for several days. Patient was admitted on 06/06/2021 with acute hypoxic respiratory failure with sepsis secondary to aspiration pneumonia, elevated D-dimer, AKI. Developed worsening respiratory distress requiring emergent tracheostomy on 5/13. Patient got PEG on 06/20/21.      SLP Plan  Continue with current plan of care      Recommendations for follow up therapy are one component of a multi-disciplinary discharge planning process, led by the attending physician.  Recommendations may be updated based on patient status, additional functional criteria and insurance authorization.     Recommendations  Diet recommendations: Regular;Honey-thick liquid Liquids provided via: Cup Medication Administration: Whole meds with puree Supervision: Patient able to self feed Compensations: Small sips/bites;Slow rate;Clear throat intermittently Postural Changes and/or Swallow Maneuvers: Seated upright 90 degrees      PMSV Supervision: Full MD: Please consider changing trach tube to : Cuffless;Smaller size         Oral Care Recommendations: Oral care QID Follow Up Recommendations: Outpatient SLP Assistance recommended at discharge: Frequent or constant Supervision/Assistance SLP Visit Diagnosis: Aphonia (R49.1) Plan: Continue with current plan of care           Sonia Baller, MA, CCC-SLP Speech Therapy

## 2021-06-21 NOTE — NC FL2 (Addendum)
[place] LEVEL OF CARE SCREENING TOOL     IDENTIFICATION  Patient Name: David [name] Birthdate: Mar 15, 1975 Sex: male Admission Date (Current Location): 06/06/2021  [facility] [place] Number:  Herbalist and Address:  [facility] [place], [place] [address] [place], [place] [address]      Provider Number: [id number]  Attending Physician Name and Address:  Lottie Mussel, MD  Relative Name and Phone Number:       Current Level of Care: Hospital Recommended Level of Care: [place] Prior Approval Number:    Date Approved/Denied:   PASRR Number:    Discharge Plan: SNF    Current Diagnoses: Patient Active Problem List   Diagnosis Date Noted   Pressure injury of skin 06/17/2021   Bacterial endocarditis    Tracheostomy status ([place])    Acute respiratory failure with hypoxia ([place])    Pneumococcal bacteremia 06/09/2021   Supraglottic airway obstruction 06/09/2021   Chronic HFrEF (heart failure with reduced ejection fraction) ([place]) 06/09/2021   Aspiration pneumonia of right lower lobe ([place]) 06/06/2021   Heart failure with reduced ejection fraction ([place]) 04/28/2021   Anxiety state 04/28/2021   Community acquired pneumonia due to Pneumococcus ([place]) 04/01/2021   Elevated troponin 04/01/2021   Elevated transaminase level 04/01/2021   Drug abuse and dependence ([place]) 01/15/2021   Weight loss, unintentional 01/15/2021   Protein-calorie malnutrition, severe 01/10/2021   Hypotension 01/08/2021   AKI (acute kidney injury) ([place]) 01/08/2021   Hyponatremia 01/08/2021   Chemotherapy induced nausea and vomiting 01/01/2021   Goals of care, counseling/discussion 12/13/2020   Laryngeal cancer ([place]) 12/04/2020   Teeth missing 12/04/2020   [place] 12/04/2020   Retained tooth root 12/04/2020   Chronic apical periodontitis 12/04/2020   Accretions on teeth 12/04/2020    Orientation RESPIRATION BLADDER Height & Weight     Self,  Time, Situation, Place  Tracheostomy (placed on 06/09/21; 28% FiO2) Continent Weight: 92 lb 13 oz (42.1 kg) Height:  5' 11"  (180.3 cm) (from previous admission)  BEHAVIORAL SYMPTOMS/MOOD NEUROLOGICAL BOWEL NUTRITION STATUS      Incontinent Feeding tube (OSMOLITE 1.5 CAL;)  AMBULATORY STATUS COMMUNICATION OF NEEDS Skin   Limited Assist Verbally PU Stage and Appropriate Care (Stage II on sacrum; closed incision on neck and abdomen)                       Personal Care Assistance Level of Assistance  Bathing, Feeding, Dressing Bathing Assistance: Maximum assistance Feeding assistance: Independent Dressing Assistance: Limited assistance     Functional Limitations Info             [place]  PT (By licensed PT), OT (By licensed OT) (Tracheostomy)     PT Frequency: 2x/week OT Frequency: 2x/week            Contractures Contractures Info: Not present    Additional Factors Info  Code Status, Allergies Code Status Info: Full Allergies Info: NKA           Current Medications (06/21/2021):  This is the current hospital active medication list Current Facility-Administered Medications  Medication Dose Route Frequency Provider Last Rate Last Admin   0.9 %  sodium chloride infusion   Intravenous PRN Romana Juniper A, MD 10 mL/hr at 06/09/21 0200 Infusion Verify at 06/09/21 0200   0.9 %  sodium chloride infusion  250 mL Intravenous Continuous Clovis Riley, MD   Stopped at 06/09/21 1018  0.9 %  sodium chloride infusion  250 mL Intravenous PRN Romana Juniper A, MD       acetaminophen (TYLENOL) tablet 1,000 mg  1,000 mg Oral TID Romana Juniper A, MD   1,000 mg at 06/21/21 1254   cefTRIAXone (ROCEPHIN) 2 g in sodium chloride 0.9 % 100 mL IVPB  2 g Intravenous Q24H Romana Juniper A, MD 200 mL/hr at 06/21/21 0839 2 g at 06/21/21 0839   chlorhexidine gluconate (MEDLINE KIT) (PERIDEX) 0.12 % solution 15 mL  15 mL Mouth Rinse BID Romana Juniper A, MD   15  mL at 06/21/21 0092   Chlorhexidine Gluconate Cloth 2 % PADS 6 each  6 each Topical Daily Clovis Riley, MD   6 each at 06/21/21 0836   DULoxetine (CYMBALTA) DR capsule 30 mg  30 mg Oral Daily Romana Juniper A, MD   30 mg at 06/21/21 0834   enoxaparin (LOVENOX) injection 30 mg  30 mg Subcutaneous Q24H Romana Juniper A, MD   30 mg at 06/21/21 3300   feeding supplement (OSMOLITE 1.5 CAL) liquid 1,000 mL  1,000 mL Per Tube Continuous Romana Juniper A, MD 45 mL/hr at 06/20/21 1700 1,000 mL at 06/20/21 1700   feeding supplement (PROSource TF) liquid 45 mL  45 mL Per Tube Daily Lottie Mussel, MD   45 mL at 76/22/63 3354   folic acid (FOLVITE) tablet 1 mg  1 mg Per Tube Daily Romana Juniper A, MD   1 mg at 06/21/21 0833   guaiFENesin (ROBITUSSIN) 100 MG/5ML liquid 5 mL  5 mL Per Tube Q6H PRN Romana Juniper A, MD   5 mL at 06/18/21 1746   hydrOXYzine (ATARAX) tablet 50 mg  50 mg Per Tube Q6H PRN Romana Juniper A, MD   50 mg at 06/21/21 0834   ibuprofen (ADVIL) tablet 600 mg  600 mg Oral Q6H Connor, Chelsea A, MD   600 mg at 06/21/21 1254   lidocaine (LIDODERM) 5 % 1 patch  1 patch Transdermal Q24H Romana Juniper A, MD   1 patch at 06/21/21 1254   MEDLINE mouth rinse  15 mL Mouth Rinse 10 times per day Romana Juniper A, MD   15 mL at 06/21/21 1254   melatonin tablet 3 mg  3 mg Per Tube QHS PRN Clovis Riley, MD   3 mg at 06/20/21 2245   methadone (DOLOPHINE) tablet 50 mg  50 mg Per Tube Daily Romana Juniper A, MD   50 mg at 06/21/21 0833   midodrine (PROAMATINE) tablet 10 mg  10 mg Per Tube TID WC Romana Juniper A, MD   10 mg at 06/21/21 1254   multivitamin with minerals tablet 1 tablet  1 tablet Per Tube Daily Romana Juniper A, MD   1 tablet at 06/21/21 0834   nicotine (NICODERM CQ - dosed in mg/24 hours) patch 14 mg  14 mg Transdermal Daily Romana Juniper A, MD   14 mg at 06/21/21 5625   oxyCODONE (Oxy IR/ROXICODONE) immediate release tablet 5 mg  5 mg Per Tube Q6H PRN Romana Juniper  A, MD   5 mg at 06/21/21 1254   pantoprazole sodium (PROTONIX) 40 mg/20 mL oral suspension 40 mg  40 mg Per Tube Daily Romana Juniper A, MD   40 mg at 06/21/21 0835   polyethylene glycol (MIRALAX / GLYCOLAX) packet 17 g  17 g Per Tube Daily Romana Juniper A, MD   17 g at 06/18/21 1137   senna-docusate (Senokot-S) tablet 1  tablet  1 tablet Per Tube BID Romana Juniper A, MD   1 tablet at 06/20/21 1035   sodium chloride flush (NS) 0.9 % injection 3 mL  3 mL Intravenous Q12H Romana Juniper A, MD   3 mL at 06/21/21 0836   sodium chloride flush (NS) 0.9 % injection 3 mL  3 mL Intravenous PRN Clovis Riley, MD       thiamine tablet 100 mg  100 mg Per Tube Daily Romana Juniper A, MD   100 mg at 06/21/21 1855     Discharge Medications: Please see discharge summary for a list of discharge medications.  Relevant Imaging Results:  Relevant Lab Results:   Additional Information SSN: [id number]. On Methadone  Benard Halsted, LCSW

## 2021-06-21 NOTE — Progress Notes (Signed)
Daily Progress Note   Patient Name: David [name]       Date: 06/21/2021 DOB: 1975-08-28  Age: 46 y.o. MRN#: [id number] Attending Physician: Lottie Mussel, MD Primary Care Physician: Delene Ruffini, MD Admit Date: 06/06/2021  Reason for Consultation/Follow-up: Establishing goals of care  Subjective: Chart review performed including labs, progress notes. Patient assessed at the bedside and reports needing suction. Located suction for him and he used on his trach/mouth. He then requests assistance with suctioning trach and I notified RN. No family was present during my visit.  Reviewed interval history since our last discussion and noted plans for possible discharge to home with his mother. Patient is stoic and states "supposedly." Discussed the benefits of ongoing palliative care support at home and goals of care discussions as an outpatient. He is agreeable to a referral placed near time of discharge.  All questions and concerns addressed. Encouraged to call with questions and/or concerns. PMT card previously provided.  Length of Stay: 15  Physical Exam Vitals and nursing note reviewed.  Constitutional:      General: He is not in acute distress.    Appearance: He is cachectic. He is ill-appearing.     Comments: 5L on trach collar  Neck:     Trachea: Tracheostomy present.  Cardiovascular:     Rate and Rhythm: Normal rate.  Pulmonary:     Effort: Pulmonary effort is normal. No respiratory distress.  Skin:    General: Skin is warm and dry.  Neurological:     Mental Status: He is oriented to person, place, and time.     Motor: Weakness present.  Psychiatric:        Mood and Affect: Affect is angry.        Behavior: Behavior is cooperative.            Vital Signs: BP 92/65  (BP Location: Right Arm)   Pulse 72   Temp 98.2 F (36.8 C) (Oral)   Resp 15   Ht '5\' 11"'$  (1.803 m) Comment: from previous admission  Wt 42.1 kg   SpO2 (!) 89%   BMI 12.94 kg/m  SpO2: SpO2: (!) 89 % O2 Device: O2 Device: Tracheostomy Collar O2 Flow Rate: O2 Flow Rate (L/min): 5 L/min  Palliative Assessment/Data: PPS 30% with tube feeds.     Palliative Care  Assessment & Plan   Patient Profile: 46 y.o. male  with past medical history of laryngeal cancer s/p chemo and radiation and heroin abuse presented to ED on 06/06/21 from home with complaints of shortness of breath and not being able to eat for several days. Patient was admitted on 06/06/2021 with acute hypoxic respiratory failure with sepsis secondary to aspiration pneumonia, elevated D-dimer, AKI.  Per conversations with CCM patient would not want intubation and now DNR/DNI.  Patient has adamantly declined feeding tubes in the past, but now states he would be open to receiving one.   Of note, patient is followed by outpatient Palliative Care at [facility].  Assessment: Principal Problem:   Acute respiratory failure with hypoxia (HCC) Active Problems:   Laryngeal cancer (HCC)   Goals of care, counseling/discussion   Protein-calorie malnutrition, severe   Drug abuse and dependence ([place])   Community acquired pneumonia due to Pneumococcus [facility])   Anxiety state   Aspiration pneumonia of right lower lobe (HCC)   Pneumococcal bacteremia   Supraglottic airway obstruction   Chronic HFrEF (heart failure with reduced ejection fraction) (HCC)   Tracheostomy status (HCC)   Bacterial endocarditis   Pressure injury of skin   Acute hypoxic respiratory failure   Chronic aspiration   Stage IV head and neck cancer, completed radiation in 2023, now with chronic supraglottic airway edema   Sepsis   Severe protein energy malnutrition   Chronic Systolic and Diastolic HF   Polysubstance Use Disorder  Recommendations/Plan: Continue  full code/full scope Patient is agreeable to outpatient palliative care referral, appreciate TOC assistance. May have issues with transportation to [facility] palliative clinic given recent decline, but could also continue with this if he stabilizes  PMT will continue to follow peripherally. If there are any imminent needs please call the service directly  Prognosis:  Unable to determine  Discharge Planning: Home with Home Health/Outpatient palliative care  Care plan was discussed with patient, NT, RN   MDM: High   Nolin Grell Gregary Signs Palliative Medicine Team Team phone # [phone]  Thank you for allowing the Palliative Medicine Team to assist in the care of this patient. Please utilize secure chat with additional questions, if there is no response within 30 minutes please call the above phone number.  Palliative Medicine Team providers are available by phone from 7am to 7pm daily and can be reached through the team cell phone.  Should this patient require assistance outside of these hours, please call the patient's attending physician.

## 2021-06-21 NOTE — Progress Notes (Signed)
Right lower extremity venous duplex has been completed. Preliminary results can be found in CV Proc through chart review.   06/21/21 1:05 PM David [name] RVT

## 2021-06-21 NOTE — TOC Progression Note (Addendum)
Transition of Care [facility]) - Progression Note    Patient Details  Name: Trino Higinbotham MRN: [id number] Date of Birth: 27-May-1975  [facility] CM/SW Contact  Carles Collet, RN Phone Number: 06/21/2021, 3:04 PM  Clinical Narrative:   Orders for [facility] RN emailed to [name] at [email or web address]. [email or web address] Anticipate about a week for medicaid approval per [place] to expect DME Trach and PEG tube feeding orders early next week  Spoke with patient's mother again today to follow up on yesterday's conversation.   Today she states that she did not agree to have him come home, she states this now after talking to her family whom she says doesn't want him home. I reoriented her to our conversation yesterday reviewing the [facility] RN, and her planning to come to the hospital to participate in his care.  She cites that she is "a little stuffy" and therefore is unable to come to the hospital again today. She became emotional and accused me of trying to make her feel bad when I reoriented to her the situation including patient's current  diagnosis, care needs, and the combined methadone use, payor source, and trach support that create a barrier to SNF placement. Patient is not eligible for SNF until trach is 20 days old on 6/13. Patient is not eligible for LTAC due to payor source Mother goes back and forth with DC plan, encouraged her again to come to hospital and assist in care to familiarize herself with care.   Expected Discharge Plan: [place] Barriers to Discharge: Continued Medical Work up  Expected Discharge Plan and Services Expected Discharge Plan: [place]                                               Social Determinants of Health (SDOH) Interventions    Readmission Risk Interventions     View : No data to display.

## 2021-06-22 ENCOUNTER — Inpatient Hospital Stay: Payer: Self-pay

## 2021-06-22 ENCOUNTER — Inpatient Hospital Stay (HOSPITAL_COMMUNITY): Payer: Medicaid Other

## 2021-06-22 DIAGNOSIS — F1920 Other psychoactive substance dependence, uncomplicated: Secondary | ICD-10-CM | POA: Diagnosis not present

## 2021-06-22 DIAGNOSIS — F411 Generalized anxiety disorder: Secondary | ICD-10-CM | POA: Diagnosis not present

## 2021-06-22 DIAGNOSIS — J9601 Acute respiratory failure with hypoxia: Secondary | ICD-10-CM | POA: Diagnosis not present

## 2021-06-22 DIAGNOSIS — I330 Acute and subacute infective endocarditis: Secondary | ICD-10-CM | POA: Diagnosis not present

## 2021-06-22 LAB — GLUCOSE, CAPILLARY
Glucose-Capillary: 106 mg/dL — ABNORMAL HIGH (ref 70–99)
Glucose-Capillary: 109 mg/dL — ABNORMAL HIGH (ref 70–99)
Glucose-Capillary: 112 mg/dL — ABNORMAL HIGH (ref 70–99)
Glucose-Capillary: 116 mg/dL — ABNORMAL HIGH (ref 70–99)
Glucose-Capillary: 117 mg/dL — ABNORMAL HIGH (ref 70–99)
Glucose-Capillary: 121 mg/dL — ABNORMAL HIGH (ref 70–99)
Glucose-Capillary: 130 mg/dL — ABNORMAL HIGH (ref 70–99)

## 2021-06-22 MED ORDER — STERILE WATER FOR INJECTION IJ SOLN
INTRAMUSCULAR | Status: AC
  Filled 2021-06-22: qty 10

## 2021-06-22 NOTE — Progress Notes (Signed)
ENT PROGRESS NOTE   Subjective: Patient seen and examined at bedside. No issues overnight. Patient doing well with trach, wearing PMV at time of exam. On trach collar. Tolerating tube feeds.  Objective: Vital signs in last 24 hours: Temp:  [97.9 F (36.6 C)-98.4 F (36.9 C)] 97.9 F (36.6 C) (05/26 0817) Pulse Rate:  [72-81] 79 (05/26 0754) Resp:  [14-28] 17 (05/26 0817) BP: (82-95)/(63-69) 88/65 (05/26 0817) SpO2:  [89 %-100 %] 96 % (05/26 0754) FiO2 (%):  [28 %] 28 % (05/26 0754)  CONSTITUTIONAL: Cachectic, no distress and alert and oriented  HENT: Head : normocephalic and atraumatic NECK: Trach in place, patent with no bleeding or drainage around trach site   Medications: I have reviewed the patient's current medications.  New Imaging: None  Assessment/Plan: David [name] is s/p emergent tracheostomy on 06/09/2021. -On trach collar, doing well with PMV -I had planned on trach change to cuffless, but trach not available at bedside at time of assessment. D/W Nursing and RT, RT to change trach later today or tomorrow -Maintain trach, no plans for decannulation at this time -Recommend follow up at trach clinic once discharged -FU with me outpatient in 3-4 months for routine cancer surveillance. Will plan on repeat laryngoscopy at that visit to determine readiness for decannulation.    LOS: 16 days    Jason Coop, [facility] 06/22/2021, 12:35 PM

## 2021-06-22 NOTE — Plan of Care (Signed)

## 2021-06-22 NOTE — Progress Notes (Signed)
Medical student and disciplines aware that patient wants a  hospital transfer, via face to face communication.
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
## 2021-06-22 NOTE — Progress Notes (Signed)
Patient is alert and oriented, expresses verbal abuse to staff despite reassurance and timely care he intermittently displays irritability and anger, with mother present in room patient is also shirt and impatient with her. Support provided to patient and family member.

## 2021-06-22 NOTE — Progress Notes (Signed)
Modified Barium Swallow Progress Note  Patient Details  Name: David [name] MRN: [id number] Date of Birth: [birth year]  Today's Date: 06/22/2021  Modified Barium Swallow completed.  Full report located under Chart Review in the Imaging Section.  Brief recommendations include the following:  Clinical Impression  Pt demonstrates a stable oropharyngeal dysphagia due to significant changes in laryngeal tissue impacting full glottic closure during the swallow and full mobility of the hyolaryngeal complex. Pt aspirates nectar and thin liquids during the swallow with ineffective cough to clear aspirate. Pt penetrates trace amounts of honey thick liquids and clears efficiently with a throat clear. If pt fully turns his head to the right before the swallows he was able to protect his airway with nectar thick liquids and only had trace penetration of thin liquids. Pt able to tolerate purees and solids with only mild vallecular residue that he can clear with a chin tuck or expectorate and swallow again. Pt is recommended to advance to regular solids but continue honey thick liquids until head turn can be trained.   Swallow Evaluation Recommendations       SLP Diet Recommendations: Regular solids;Honey thick liquids   Liquid Administration via: Cup   Medication Administration: Whole meds with puree   Supervision: Patient able to self feed   Compensations: Slow rate;Small sips/bites       Oral Care Recommendations: Oral care BID      Herbie Baltimore, MA CCC-SLP  Acute Rehabilitation Services Secure Chat Preferred Office [phone]   Lynann Beaver 06/22/2021,3:25 PM

## 2021-06-22 NOTE — Progress Notes (Signed)
PT Cancellation Note  Patient Details Name: David [name] MRN: [id number] DOB: Dec 06, 1975   Cancelled Treatment:    Reason Eval/Treat Not Completed: (P) Patient declined, no reason specified PT will follow back next week.  Makana Feigel B. Migdalia Dk PT, DPT Acute Rehabilitation Services Please use secure chat or  Call Office [phone]   [place] 06/22/2021, 4:12 PM

## 2021-06-22 NOTE — Progress Notes (Signed)
Consult/order placed for Midline placement. This nurse arrived to patient's room to assess/attempt midline placement. Patient refused asking for his sleeping medicine. Requested for it to be placed tomorrow. Nurse, Drucie Opitz, notified. Fran Lowes, RN VAST

## 2021-06-22 NOTE — Progress Notes (Signed)
This nurse was approached by a Student stating that the patient complained of sitting in urine for hours and this was immediately after this nurse was in this patient room for approximately 30 minutes administering his medication, assisting patient with repositioning and tracheal suctioning. This nurse vocalized to the student that the patient did not mention anything of the sort while providing care. This nurse and 2 techs returned to the patients room to provide hygiene care and prophylactic foam dressing. When patient was asked by this nurse did he not know if he was wet when receiving his medication he rolled his eyes and stated,"I am now."

## 2021-06-22 NOTE — Progress Notes (Signed)
Pt Sbp 83/64 map 70. Per Internal medicine MD [place]. Goal is map >65. No new orders.

## 2021-06-22 NOTE — Progress Notes (Signed)
Secure chat to Dr. Betha Loa, Dr. Latanya Maudlin and Dr. Cain Sieve as well as bedside RN Lattie Haw regarding PICC order.  Per Dr. Latanya Maudlin patient will remain in hospital for his remaining 2 weeks of prescribed ceftriazone IV.  Discussed placing midline for this therapy to provide patient with stable IV access that is less invasive than PICC.  Midline ordered.

## 2021-06-22 NOTE — Progress Notes (Signed)
On initial rounds this patient stated he would like to be transferred to another hospital, Message left for Attending and medical students via communication board.

## 2021-06-23 DIAGNOSIS — J9601 Acute respiratory failure with hypoxia: Secondary | ICD-10-CM | POA: Diagnosis not present

## 2021-06-23 LAB — GLUCOSE, CAPILLARY
Glucose-Capillary: 110 mg/dL — ABNORMAL HIGH (ref 70–99)
Glucose-Capillary: 133 mg/dL — ABNORMAL HIGH (ref 70–99)
Glucose-Capillary: 108 mg/dL — ABNORMAL HIGH (ref 70–99)
Glucose-Capillary: 73 mg/dL (ref 70–99)
Glucose-Capillary: 75 mg/dL (ref 70–99)
Glucose-Capillary: 107 mg/dL — ABNORMAL HIGH (ref 70–99)

## 2021-06-23 MED ORDER — SODIUM CHLORIDE 0.9% FLUSH
10.0000 mL | INTRAVENOUS | Status: DC | PRN
  Administered 2021-07-06 – 2021-07-10 (×2): 10 mL

## 2021-06-23 MED ORDER — SODIUM CHLORIDE 0.9% FLUSH
10.0000 mL | Freq: Two times a day (BID) | INTRAVENOUS | Status: DC
  Administered 2021-06-23 – 2021-06-30 (×14): 10 mL

## 2021-06-23 MED ORDER — DEXTROSE 50 % IV SOLN
1.0000 | Freq: Once | INTRAVENOUS | Status: AC
  Administered 2021-06-23: 50 mL via INTRAVENOUS
  Filled 2021-06-23: qty 50

## 2021-06-23 NOTE — Progress Notes (Signed)
Subjective:   Hospital day:17  Overnight event: No acute events overnight  Interim History: Patient evaluated at the bedside. Continues to ask for more pain medicine. States the nursing staff are not attending to his needs as quickly as he would like. States he sat in his stool for more than 20 minutes yesterday. Reports having a good visit with Dr. Fredric Dine yesterday and he like her.   Objective:  Vital signs in last 24 hours: Vitals:   06/23/21 0753 06/23/21 1100 06/23/21 1208 06/23/21 1400  BP:  94/64  109/70  Pulse: 89 70 92   Resp: (!) 31 13 (!) 21   Temp:  97.9 F (36.6 C)    TempSrc:  Axillary    SpO2: 99% 96% 94%   Weight:      Height:        Filed Weights   06/09/21 0500 06/12/21 0500 06/23/21 0500  Weight: 41.8 kg 42.1 kg 50.8 kg     Intake/Output Summary (Last 24 hours) at 06/23/2021 1528 Last data filed at 06/23/2021 0109 Gross per 24 hour  Intake --  Output 975 ml  Net -975 ml   Net IO Since Admission: -2,186.07 mL [06/23/21 1528]  Recent Labs    06/22/21 2334 06/23/21 0350 06/23/21 1134  GLUCAP 112* 110* 133*     Pertinent Labs:    Latest Ref Rng & Units 06/21/2021    4:22 AM 06/20/2021    1:37 AM 06/19/2021    2:01 AM  CBC  WBC 4.0 - 10.5 K/uL 4.9   5.4   6.4    Hemoglobin 13.0 - 17.0 g/dL 8.1   8.2   8.7    Hematocrit 39.0 - 52.0 % 24.6   25.5   26.5    Platelets 150 - 400 K/uL 212   223   247         Latest Ref Rng & Units 06/17/2021    1:08 AM 06/15/2021    1:27 AM 06/14/2021    2:40 AM  CMP  Glucose 70 - 99 mg/dL 110   82   116    BUN 6 - 20 mg/dL '11   14   14    '$ Creatinine 0.61 - 1.24 mg/dL 0.46   0.43   0.45    Sodium 135 - 145 mmol/L 134   130   130    Potassium 3.5 - 5.1 mmol/L 4.4   4.5   5.4    Chloride 98 - 111 mmol/L 98   94   97    CO2 22 - 32 mmol/L '31   29   24    '$ Calcium 8.9 - 10.3 mg/dL 8.0   8.1   8.1      Imaging: [name] EKG SITE RITE  Result Date: 06/22/2021 If Site Rite image not attached, placement could not  be confirmed due to current cardiac rhythm.   Physical Exam  General: Frail, cachectic and chronically ill middle-aged man.  NAD. HEENT: Trach stable in place. CV: RRR. No m/r/g. No LE edema Pulmonary: Anterior lung sounds clear to auscultation.  No wheezing or rales. Abdominal: NT/ND. G-tube site stable. Extremities: 2+ distal pulses. Normal ROM. Skin: Warm and dry. No obvious rash or lesions. Psych: Angry and tearful at times  Assessment/Plan: David [name] is a 46 y.o. 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 Hospital course has been complicated  by ICU admission requiring tracheostomy, bacteremia, concern for endocarditis, and persistent hypotension.    Principal Problem:   Acute respiratory failure with hypoxia (HCC) Active Problems:   Laryngeal cancer (HCC)   Goals of care, counseling/discussion   Protein-calorie malnutrition, severe   Drug abuse and dependence ([place])   Community acquired pneumonia due to Pneumococcus [facility])   Anxiety state   Aspiration pneumonia of right lower lobe (HCC)   Pneumococcal bacteremia   Supraglottic airway obstruction   Chronic HFrEF (heart failure with reduced ejection fraction) ([place])   Tracheostomy status (HCC)   Bacterial endocarditis   Pressure injury of skin  # Acute respiratory failure with hypoxia # s/p tracheostomy placement # Aspiration pneumonia, treated Tracheostomy placed 5/13 for respiratory compromise in ICU. Still making significant secretions. Completed 5- day course of Flagyl and Rocephin on 5/21 for aspiration pneumonia. Has been weaned off supplemental O2 with O2 sats above 95%. Seen by ENT yesterday with plan to change trach to cuffless but no plans for decannulation at this time. - ENT following, appreciate recs - Routine trach care - Guaifenesin 5 mL q6h PRN secretions  - Continuous pulse ox monitoring   - ENT follow-up in 3 to 4 months for routine cancer surveillance and repeat laryngoscopy to assess readiness for decannulation   # Endocarditis  # Strep pneumo bacteremia, resolved  WBC wnl at 4.6. TTE with thickened anterior mitral leaflet with possible small hypermobile density concerning for vegetation. Per Cardiology, patient not a candidate for TEE given his history of radiation for laryngeal cancer. Patient remains afebrile, plan to continue empiric treatment for endocarditis based on TTE findings and history of recent IVDU. Unfortunately we have encountered multiple barriers to placement at SNF or LTAC. Discussed with ID about oral alternatives for patient to complete antibiotic course at home but standard of treatment for Strep bacteremia with concern for endocarditis is IV antibiotics. Continues to be afebrile. Midline placed today. - IV Ceftriaxone x 4 weeks (end date 07/06/21) - Reduce CBC to 2x week - Stop overnight vital check   #Chronic hypotension Patient with a history of chronic hypertension. SBP has remained in the 80s to 90s throughout most of hospitalization. SBP slightly improved to 90s to 100s in the last 24 hours. - Midodrine 10 mg TID  - IVF PRN to keep MAP > 60   # Protein-calorie malnutrition, severe PEG tube successfully placed on 5/24.  Repeat MBBS showed improvement in swallowing.  SLP recommended advancement to regular solids with honey thick liquids. RD planning on changing continuous feeds to bolus next Tuesday 5/30.  - Change diet to regular with honey thick liquids - RD following, appreciate rec - CBG q4h   # Normocytic anemia, stable  Hgb 8.1. Will continue to monitor.  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  - Methadone 50 mg daily  - Nicotine patch daily    # Chronic pain  # R calf pain Patient  continues to ask for more pain medications.  Escalation of pain regiment continues to be limited by patient's persistent hypotension. - Ibuprofen 600 mg q6 hours  - Tylenol 1000 mg TID - Duloxetine 30 mg daily  - Oxycodone 5 mg q6h PRN mod/severe pain  - Methadone 50 mg daily - Lidocaine patch daily  - Exedrin tab q6h PRN headache    #  Anxiety, Depression  # Goals of care Continue full code/full scope of care and do what we can to improve patient's comfort while he's here.  - Hydroxyzine 50 mg q6h PRN anxiety  - Duloxetine 30 mg daily  - Palliative Care consulted, appreciate assistance    # Hepatitis C  HCV RNA 792,000 on admission, indicative of chronic infection. ID recommended outpatient treatment.   Diet: Regular diet, TF IVF: None VTE: Lovenox CODE: Full  Prior to Admission Living Arrangement: Home Anticipated Discharge Location: Pending Barriers to Discharge: Medical treatment Dispo: Anticipated discharge in approximately 2 weeks.   Signed: Lacinda Axon, MD 06/23/2021, 3:28 PM  Pager: [phone] Internal Medicine Teaching Service After 5pm on weekdays and 1pm on weekends: On Call pager: [phone]

## 2021-06-23 NOTE — Progress Notes (Signed)
Pt refuses to wear ATC at this time. RT explained the importance of wearing it, but is adamant about not wearing it.

## 2021-06-23 NOTE — Progress Notes (Signed)

## 2021-06-23 NOTE — Progress Notes (Signed)
Pt awake and alert. Found trach open to RA, ATC at bedside, however PT refusing. PT also refusing trach care, no gauze in place per Pt. PT did allow me to assist in suctioning trach. Pt in control of PMV and takes on and off. No resp distress noted.

## 2021-06-24 ENCOUNTER — Encounter (HOSPITAL_COMMUNITY): Payer: Self-pay | Admitting: Internal Medicine

## 2021-06-24 DIAGNOSIS — J9601 Acute respiratory failure with hypoxia: Secondary | ICD-10-CM | POA: Diagnosis not present

## 2021-06-24 DIAGNOSIS — Z931 Gastrostomy status: Secondary | ICD-10-CM

## 2021-06-24 HISTORY — DX: Gastrostomy status: Z93.1

## 2021-06-24 LAB — GLUCOSE, CAPILLARY
Glucose-Capillary: 103 mg/dL — ABNORMAL HIGH (ref 70–99)
Glucose-Capillary: 107 mg/dL — ABNORMAL HIGH (ref 70–99)
Glucose-Capillary: 115 mg/dL — ABNORMAL HIGH (ref 70–99)
Glucose-Capillary: 150 mg/dL — ABNORMAL HIGH (ref 70–99)
Glucose-Capillary: 63 mg/dL — ABNORMAL LOW (ref 70–99)
Glucose-Capillary: 74 mg/dL (ref 70–99)
Glucose-Capillary: 93 mg/dL (ref 70–99)

## 2021-06-24 MED ORDER — DEXTROSE 50 % IV SOLN
1.0000 | Freq: Once | INTRAVENOUS | Status: AC
Start: 2021-06-24 — End: 2021-06-24
  Administered 2021-06-24: 50 mL via INTRAVENOUS
  Filled 2021-06-24: qty 50

## 2021-06-24 NOTE — Progress Notes (Addendum)
Hospital day: 18  Subjective:   Overnight events: No acute overnight events.   Patient seen at bedside during morning rounds. He continues to express frustration with his care. He notes that he talked to Dr. Fredric Dine on Friday but says he is not sure what the plan for his trach is, and he is unhappy that it was placed.   Objective:  Vital signs in last 24 hours: Vitals:   06/23/21 2312 06/23/21 2337 06/24/21 0344 06/24/21 0355  BP: (!) 88/56 (!) 88/56 (!) 87/69 (!) 87/69  Pulse: 75 68 91 73  Resp: '15 16 18 20  '$ Temp: 98 F (36.7 C)     TempSrc: Oral     SpO2: 100% 96% 98% 96%  Weight:      Height:        Filed Weights   06/09/21 0500 06/12/21 0500 06/23/21 0500  Weight: 41.8 kg 42.1 kg 50.8 kg     Intake/Output Summary (Last 24 hours) at 06/24/2021 0718 Last data filed at 06/24/2021 0345 Gross per 24 hour  Intake --  Output 1025 ml  Net -1025 ml   Net IO Since Admission: -3,211.07 mL [06/24/21 0718]   Pertinent Labs:    Latest Ref Rng & Units 06/21/2021    4:22 AM 06/20/2021    1:37 AM 06/19/2021    2:01 AM  CBC  WBC 4.0 - 10.5 K/uL 4.9   5.4   6.4    Hemoglobin 13.0 - 17.0 g/dL 8.1   8.2   8.7    Hematocrit 39.0 - 52.0 % 24.6   25.5   26.5    Platelets 150 - 400 K/uL 212   223   247         Latest Ref Rng & Units 06/17/2021    1:08 AM 06/15/2021    1:27 AM 06/14/2021    2:40 AM  CMP  Glucose 70 - 99 mg/dL 110   82   116    BUN 6 - 20 mg/dL '11   14   14    '$ Creatinine 0.61 - 1.24 mg/dL 0.46   0.43   0.45    Sodium 135 - 145 mmol/L 134   130   130    Potassium 3.5 - 5.1 mmol/L 4.4   4.5   5.4    Chloride 98 - 111 mmol/L 98   94   97    CO2 22 - 32 mmol/L '31   29   24    '$ Calcium 8.9 - 10.3 mg/dL 8.0   8.1   8.1      Recent Labs    06/23/21 2326 06/24/21 0339 06/24/21 0447  GLUCAP 107* 74 150*     Imaging: No results found.  Physical Exam  General: Cachectic, scowling male  HEENT: Trach collar present with ATC in place, speaking valve  inserted by patient to communicate  CV: RRR, no murmurs heard  Pulmonary: Coarse breath sounds, normal WOB  Abdominal: Soft, non-tender Extremities: No LE edema Skin: Warm, dry, no rashes or lesions seen Neuro: A&O x3, no focal deficits  Psych: Angry, tearful at times  Assessment/Plan: David [name] is a 46 y.o. 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 Hospital course has been complicated by ICU admission requiring tracheostomy, bacteremia, concern for endocarditis, and persistent hypotension.     Principal Problem:   Acute respiratory failure with  hypoxia ([place]) Active Problems:   Laryngeal cancer (HCC)   Goals of care, counseling/discussion   Hypotension   Protein-calorie malnutrition, severe   Drug abuse and dependence ([place])   Community acquired pneumonia due to Pneumococcus [facility])   Anxiety state   Aspiration pneumonia of right lower lobe (HCC)   Supraglottic airway obstruction   Chronic HFrEF (heart failure with reduced ejection fraction) ([place])   Tracheostomy status (HCC)   Bacterial endocarditis   Pressure injury of skin   S/P percutaneous endoscopic gastrostomy (PEG) tube placement ([place])   # Acute respiratory failure with hypoxia # s/p tracheostomy placement # Aspiration pneumonia, treated Tracheostomy placed 5/13 for respiratory compromise in ICU. Completed 5-day course of Flagyl and Rocephin on 5/21 for aspiration pneumonia. Has been weaned off supplemental O2 with O2 sats remaining above 95%. Seen by ENT on 5/26 with plan to change trach to cuffless but no plans for decannulation at this time.  - ENT following, appreciate recs - Routine trach care - Guaifenesin 5 mL q6h PRN secretions  - Continuous pulse ox monitoring  - ENT follow-up in 3 to 4 months for routine cancer surveillance and repeat laryngoscopy to assess readiness for decannulation    # Endocarditis  # Strep pneumo bacteremia, resolved  WBC wnl at 4.6. TTE with thickened anterior mitral leaflet with possible small hypermobile density concerning for vegetation. Per Cardiology, patient not a candidate for TEE given his history of radiation for laryngeal cancer. Patient remains afebrile, plan to continue empiric treatment for endocarditis based on TTE findings and history of recent IVDU. Midline placed on 5/27.  - IV Ceftriaxone x 4 weeks (end date 07/06/21) - Reduce CBC to 2x week - Stop overnight vital check   # Chronic hypotension Patient with a history of chronic hypotension. SBP has remained in the 80s to 90s throughout most of hospitalization.  - Midodrine 10 mg TID  - IVF PRN to keep MAP > 60   # Protein-calorie malnutrition, severe PEG tube successfully placed on 5/24. Repeat MBBS showed improvement in swallowing. SLP recommended advancement to regular solids with honey thick liquids. RD planning on changing continuous feeds to bolus on 5/30. Will check BMP, Mg, and Phos tomorrow to monitor for re-feeding syndrome.  - RD following, appreciate recs - CBG q4h - BMP, Mg, and Phos tomorrow   # Normocytic anemia, stable  Most recent Hgb 8.1 from 5/25. Will continue to monitor and check CBC with BMP tomorrow.  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  - Methadone 50 mg daily  - Nicotine patch daily    # Chronic pain  # R calf pain Patient continues to request additional pain medications.  Escalation of pain regiment continues to be limited by patient's persistent hypotension. - Ibuprofen 600 mg q6 hours  - Tylenol 1000 mg TID - Increase Duloxetine to 60 mg daily  - Oxycodone 5 mg q6h PRN mod/severe pain  - Methadone 50 mg daily - Lidocaine patch daily  - Exedrin tab q6h PRN headache    # Anxiety, Depression  #  Goals of care Continue full code/full scope of care and do what we can to improve patient's comfort while he's here. Discussed increasing Duloxetine to 60 mg for mood and chronic pain.  - Hydroxyzine 50 mg q6h PRN anxiety  - Increase duloxetine  to 60 mg daily  - Palliative Care consulted, appreciate assistance    # Hepatitis C  HCV RNA 792,000 on admission, indicative of chronic infection. ID recommended outpatient treatment.  Diet: Regular with honey thick liquids, TF IVF: None VTE: Lovenox CODE: Full  Prior to Admission Living Arrangement: Home Anticipated Discharge Location: Pending Barriers to Discharge: Medical treatment Dispo: Anticipated discharge in approximately 2 weeks  Signed: Sabino Dick, [facility] Internal Medicine Teaching Service [phone] Please contact the on call pager after 5 pm and on weekends at [phone].  I have reviewed the note by Sabino Dick MS 4 and was present during the interview and physical exam.  I agree with the findings, assessment, and plan.  Lacinda Axon, MD 06/24/2021, 12:41 PM IM Resident, PGY-2 Pager: [phone] Isaiah 41:10

## 2021-06-25 DIAGNOSIS — J9601 Acute respiratory failure with hypoxia: Secondary | ICD-10-CM | POA: Diagnosis not present

## 2021-06-25 LAB — GLUCOSE, CAPILLARY
Glucose-Capillary: 99 mg/dL (ref 70–99)
Glucose-Capillary: 119 mg/dL — ABNORMAL HIGH (ref 70–99)
Glucose-Capillary: 116 mg/dL — ABNORMAL HIGH (ref 70–99)
Glucose-Capillary: 104 mg/dL — ABNORMAL HIGH (ref 70–99)
Glucose-Capillary: 96 mg/dL (ref 70–99)

## 2021-06-25 LAB — CBC
HCT: 25 % — ABNORMAL LOW (ref 39.0–52.0)
Hemoglobin: 8 g/dL — ABNORMAL LOW (ref 13.0–17.0)
MCH: 30.3 pg (ref 26.0–34.0)
MCHC: 32 g/dL (ref 30.0–36.0)
MCV: 94.7 fL (ref 80.0–100.0)
Platelets: 217 10*3/uL (ref 150–400)
RBC: 2.64 MIL/uL — ABNORMAL LOW (ref 4.22–5.81)
RDW: 15.3 % (ref 11.5–15.5)
WBC: 4.9 10*3/uL (ref 4.0–10.5)
nRBC: 0 % (ref 0.0–0.2)

## 2021-06-25 LAB — MAGNESIUM: Magnesium: 1.8 mg/dL (ref 1.7–2.4)

## 2021-06-25 LAB — BASIC METABOLIC PANEL
Anion gap: 5 (ref 5–15)
BUN: 13 mg/dL (ref 6–20)
CO2: 32 mmol/L (ref 22–32)
Calcium: 8.5 mg/dL — ABNORMAL LOW (ref 8.9–10.3)
Chloride: 94 mmol/L — ABNORMAL LOW (ref 98–111)
Creatinine, Ser: 0.41 mg/dL — ABNORMAL LOW (ref 0.61–1.24)
GFR, Estimated: >60 mL/min (ref >60)
Glucose, Bld: 87 mg/dL (ref 70–99)
Potassium: 4.2 mmol/L (ref 3.5–5.1)
Sodium: 131 mmol/L — ABNORMAL LOW (ref 135–145)

## 2021-06-25 LAB — PHOSPHORUS: Phosphorus: 4.2 mg/dL (ref 2.5–4.6)

## 2021-06-25 MED ORDER — DULOXETINE HCL 60 MG PO CPEP
60.0000 mg | ORAL_CAPSULE | Freq: Every day | ORAL | Status: DC
  Administered 2021-06-25 – 2021-08-22 (×58): 60 mg via ORAL
  Filled 2021-06-25 (×58): qty 1

## 2021-06-25 NOTE — Progress Notes (Addendum)
Hospital day: 19  Subjective:   Overnight events: No acute overnight events.   Patient evaluated at the bedside laying comfortably in bed. Patient reports that he was not fed yesterday. States he did not receive his meal trays in the feeding tube device malfunctioned yesterday. Patient frustrated about feeling hungry all day yesterday. Reassured patient we will speak with nursing staff and ensure he gets his meals.  Objective:  Vital signs in last 24 hours: Vitals:   06/25/21 0318 06/25/21 0352 06/25/21 0801 06/25/21 0811  BP:  (!) 81/60 (!) 85/58   Pulse: 91 71 76 74  Resp: (!) 34 '18 17 17  '$ Temp:  98.1 F (36.7 C) 97.9 F (36.6 C)   TempSrc:  Oral Oral   SpO2: 95% 100% 100% 100%  Weight:      Height:        Filed Weights   06/09/21 0500 06/12/21 0500 06/23/21 0500  Weight: 41.8 kg 42.1 kg 50.8 kg     Intake/Output Summary (Last 24 hours) at 06/25/2021 1114 Last data filed at 06/25/2021 0600 Gross per 24 hour  Intake --  Output 2030 ml  Net -2030 ml   Net IO Since Admission: -5,241.07 mL [06/25/21 1114]   Pertinent Labs:    Latest Ref Rng & Units 06/25/2021    9:54 AM 06/21/2021    4:22 AM 06/20/2021    1:37 AM  CBC  WBC 4.0 - 10.5 K/uL 4.9   4.9   5.4    Hemoglobin 13.0 - 17.0 g/dL 8.0   8.1   8.2    Hematocrit 39.0 - 52.0 % 25.0   24.6   25.5    Platelets 150 - 400 K/uL 217   212   223         Latest Ref Rng & Units 06/25/2021    9:54 AM 06/17/2021    1:08 AM 06/15/2021    1:27 AM  CMP  Glucose 70 - 99 mg/dL 87   110   82    BUN 6 - 20 mg/dL '13   11   14    '$ Creatinine 0.61 - 1.24 mg/dL 0.41   0.46   0.43    Sodium 135 - 145 mmol/L 131   134   130    Potassium 3.5 - 5.1 mmol/L 4.2   4.4   4.5    Chloride 98 - 111 mmol/L 94   98   94    CO2 22 - 32 mmol/L 32   31   29    Calcium 8.9 - 10.3 mg/dL 8.5   8.0   8.1      Recent Labs    06/24/21 2355 06/25/21 0350 06/25/21 0853  GLUCAP 103* 99 119*     Imaging: No results found.  Physical  Exam  General: Chronically ill, cachectic middle-aged man laying in bed. HEENT: Trach collar present with ATC in place, speaking valve inserted by patient to communicate  CV: RRR, no murmurs heard  Pulmonary: Coarse breath sounds, normal WOB  Extremities: No LE edema Skin: Warm, dry, no rashes or lesions seen Psych: Frustrated mood.  Appropriate affect.  Assessment/Plan: David [name] is a 46 y.o. 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 Hospital course has been complicated by ICU admission requiring tracheostomy, bacteremia, concern for endocarditis, and persistent hypotension.     Principal Problem:  Acute respiratory failure with hypoxia (HCC) Active Problems:   Laryngeal cancer (HCC)   Goals of care, counseling/discussion   Hypotension   Protein-calorie malnutrition, severe   Drug abuse and dependence ([place])   Community acquired pneumonia due to Pneumococcus [facility])   Anxiety state   Aspiration pneumonia of right lower lobe (HCC)   Supraglottic airway obstruction   Chronic HFrEF (heart failure with reduced ejection fraction) ([place])   Tracheostomy status (HCC)   Bacterial endocarditis   Pressure injury of skin   S/P percutaneous endoscopic gastrostomy (PEG) tube placement ([place])   # Acute respiratory failure with hypoxia # s/p tracheostomy placement # Aspiration pneumonia, treated Tracheostomy placed 5/13 for respiratory compromise in ICU. Completed 5-day course of Flagyl and Rocephin on 5/21 for aspiration pneumonia. Has been weaned off supplemental O2 with O2 sats remaining above 95%. Seen by ENT on 5/26 with plan to change trach to cuffless but no plans for decannulation at this time. Patient remains stable from respiratory standpoint. - ENT following, appreciate recs - Routine trach care - Guaifenesin 5 mL q6h PRN secretions  - Continuous pulse ox  monitoring  - ENT follow-up in 3 to 4 months for routine cancer surveillance and repeat laryngoscopy to assess readiness for decannulation   # Endocarditis  # Strep pneumo bacteremia, resolved  TTE with thickened anterior mitral leaflet with possible small hypermobile density concerning for vegetation. Per Cardiology, patient not a candidate for TEE given his history of radiation for laryngeal cancer. Patient remains afebrile, plan to continue empiric treatment for endocarditis based on TTE findings and history of recent IVDU. Midline placed on 5/27. Repeat CBC today shows stable WBC and Hgb.  - IV Ceftriaxone x 4 weeks (end date 07/06/21) - Continue CBC 2x week - Stop overnight vital check   # Chronic hypotension Patient with a history of chronic hypotension. SBP has remained in the 80s to 90s throughout most of hospitalization. SBP in the 80s-100s in the last 24 hours. - Continue midodrine 10 mg TID  - IVF PRN to keep MAP > 60   # Protein-calorie malnutrition, severe PEG tube successfully placed on 5/24. Repeat MBBS showed improvement in swallowing. SLP recommended advancement to regular solids with honey thick liquids. RD planning on changing continuous feeds to bolus on 5/30.  Repeat labs show normal kidney function and electrolytes. Per patient, he was not fed yesterday and tube feed device malfunctioned. Checked with RN who informed me pt received his meals late but was fed. CBGs have been stable in the 100s to 110s. Weight is up 19 lbs since admission. - RD following, appreciate recs - CBG q4h   # Normocytic anemia, stable  CBC today shows stable hemoglobin of 8.0. 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  - Methadone 50 mg daily  - Nicotine patch daily    # Chronic pain  # R calf pain Patient continues to request additional pain  medications.  Escalation of pain regiment continues to be limited by patient's persistent hypotension. - Ibuprofen 600 mg q6 hours  - Tylenol 1000 mg TID - Continue duloxetine 60 mg daily  - Oxycodone 5 mg q6h PRN mod/severe pain  - Methadone 50 mg daily - Lidocaine patch daily  - Exedrin tab q6h PRN headache    # Anxiety, Depression  # Goals of care Continue full  code/full scope of care and do what we can to improve patient's comfort while he's here. Discussed increasing Duloxetine to 60 mg for mood and chronic pain.  - Hydroxyzine 50 mg q6h PRN anxiety  - Continue duloxetine 60 mg daily  - Palliative Care following, appreciate assistance    # Hepatitis C  HCV RNA 792,000 on admission, indicative of chronic infection. ID recommended outpatient treatment.  Diet: Regular with honey thick liquids, TF IVF: None VTE: Lovenox CODE: Full  Prior to Admission Living Arrangement: Home Anticipated Discharge Location: Pending Barriers to Discharge: Medical treatment Dispo: Anticipated discharge in approximately 1.5 weeks  Lacinda Axon, MD 06/25/2021, 11:14 AM IM Resident, PGY-2 Pager: [phone] Isaiah 41:10

## 2021-06-26 DIAGNOSIS — I330 Acute and subacute infective endocarditis: Secondary | ICD-10-CM | POA: Diagnosis not present

## 2021-06-26 DIAGNOSIS — J9601 Acute respiratory failure with hypoxia: Secondary | ICD-10-CM | POA: Diagnosis not present

## 2021-06-26 DIAGNOSIS — E43 Unspecified severe protein-calorie malnutrition: Secondary | ICD-10-CM | POA: Diagnosis not present

## 2021-06-26 DIAGNOSIS — F411 Generalized anxiety disorder: Secondary | ICD-10-CM | POA: Diagnosis not present

## 2021-06-26 LAB — GLUCOSE, CAPILLARY
Glucose-Capillary: 102 mg/dL — ABNORMAL HIGH (ref 70–99)
Glucose-Capillary: 106 mg/dL — ABNORMAL HIGH (ref 70–99)
Glucose-Capillary: 112 mg/dL — ABNORMAL HIGH (ref 70–99)
Glucose-Capillary: 118 mg/dL — ABNORMAL HIGH (ref 70–99)
Glucose-Capillary: 122 mg/dL — ABNORMAL HIGH (ref 70–99)
Glucose-Capillary: 124 mg/dL — ABNORMAL HIGH (ref 70–99)
Glucose-Capillary: 76 mg/dL (ref 70–99)

## 2021-06-26 MED ORDER — OSMOLITE 1.5 CAL PO LIQD
355.0000 mL | Freq: Three times a day (TID) | ORAL | Status: DC
  Administered 2021-06-27 – 2021-06-28 (×4): 355 mL
  Filled 2021-06-26 (×6): qty 474

## 2021-06-26 MED ORDER — OSMOLITE 1.5 CAL PO LIQD
355.0000 mL | Freq: Three times a day (TID) | ORAL | Status: DC
Start: 2021-06-26 — End: 2021-06-26

## 2021-06-26 NOTE — Progress Notes (Signed)
Speech Language Pathology Treatment: [name] Speaking valve  Patient Details Name: David [name] MRN: [id number] DOB: 1975-09-13 Today's Date: 06/26/2021 Time: 0950-1000 SLP Time Calculation (min) (ACUTE ONLY): 10 min  Assessment / Plan / Recommendation Clinical Impression  Patient seen by SLP for skilled treatment session focused on PMV toleration as patient currently declining to have any PO's. SLP updated patient's PMV and swallow safety signs to reflect current recommendations. Patient able to don and doff PMV without cues/assistance but he will keep it off majority of the time. He continues to report he is getting thick and copious tracheal secretions and "it was bad this morning." RN reported that plan was for trach change today to cuffless but not a change in trach size. Patient noted to be using end of suction tubing for oral suction instead of Yaunkers suction tip and when asked patient said "I like this better". Although more alert, pleasant and responsive today, patient continues to exhibit limited participation and will quickly disengage from communication with SLP and close eyes. SLP will continue to follow patient for PMV toleration and determine patient's desire/willingness to participate in trials of nectar thick liquids with use of added precautions noted in most recent MBS report.     HPI HPI: 46yo male with past medical history of laryngeal cancer s/p chemo and radiation and heroin abuse presented to ED on 06/06/21 from home with complaints of shortness of breath and not being able to eat for several days. Patient was admitted on 06/06/2021 with acute hypoxic respiratory failure with sepsis secondary to aspiration pneumonia, elevated D-dimer, AKI. Developed worsening respiratory distress requiring emergent tracheostomy on 5/13. Patient got PEG on 06/20/21.      SLP Plan  Continue with current plan of care      Recommendations for follow up therapy are one component of a  multi-disciplinary discharge planning process, led by the attending physician.  Recommendations may be updated based on patient status, additional functional criteria and insurance authorization.    Recommendations  Diet recommendations: Regular;Honey-thick liquid Liquids provided via: Cup Medication Administration: Whole meds with puree Supervision: Patient able to self feed Compensations: Slow rate;Small sips/bites Postural Changes and/or Swallow Maneuvers: Seated upright 90 degrees      Patient may use Passy-Muir Speech Valve: During all therapies with supervision;During all waking hours (remove during sleep) PMSV Supervision: Intermittent MD: Please consider changing trach tube to : Smaller size         Oral Care Recommendations: Oral care QID Follow Up Recommendations: Outpatient SLP Assistance recommended at discharge: Intermittent Supervision/Assistance SLP Visit Diagnosis: Aphonia (R49.1) Plan: Continue with current plan of care         Sonia Baller, MA, CCC-SLP Speech Therapy

## 2021-06-26 NOTE — Progress Notes (Signed)
Hospital day: 20  Subjective:   Overnight events: No acute events.   Patient seen at bedside during morning rounds. He is comfortable appearing and states he is ready to work with PT. He is pleasantly surprised to hear that he has gained 19 lbs since admission. He states that his thick secretions have been preventing him from eating a regular diet.   Objective:  Vital signs in last 24 hours: Vitals:   06/26/21 0033 06/26/21 0345 06/26/21 0445 06/26/21 0850  BP: (!) 85/60 (!) 86/64  95/68  Pulse: 73 71  73  Resp: '17 16  17  '$ Temp: 97.7 F (36.5 C) 97.8 F (36.6 C)  97.7 F (36.5 C)  TempSrc: Oral Oral  Oral  SpO2: 99% 96% 100% 100%  Weight:      Height:        Filed Weights   06/09/21 0500 06/12/21 0500 06/23/21 0500  Weight: 41.8 kg 42.1 kg 50.8 kg     Intake/Output Summary (Last 24 hours) at 06/26/2021 1113 Last data filed at 06/26/2021 0556 Gross per 24 hour  Intake 240 ml  Output 1450 ml  Net -1210 ml   Net IO Since Admission: -6,331.07 mL [06/26/21 1113]   Pertinent Labs:    Latest Ref Rng & Units 06/25/2021    9:54 AM 06/21/2021    4:22 AM 06/20/2021    1:37 AM  CBC  WBC 4.0 - 10.5 K/uL 4.9   4.9   5.4    Hemoglobin 13.0 - 17.0 g/dL 8.0   8.1   8.2    Hematocrit 39.0 - 52.0 % 25.0   24.6   25.5    Platelets 150 - 400 K/uL 217   212   223         Latest Ref Rng & Units 06/25/2021    9:54 AM 06/17/2021    1:08 AM 06/15/2021    1:27 AM  CMP  Glucose 70 - 99 mg/dL 87   110   82    BUN 6 - 20 mg/dL '13   11   14    '$ Creatinine 0.61 - 1.24 mg/dL 0.41   0.46   0.43    Sodium 135 - 145 mmol/L 131   134   130    Potassium 3.5 - 5.1 mmol/L 4.2   4.4   4.5    Chloride 98 - 111 mmol/L 94   98   94    CO2 22 - 32 mmol/L 32   31   29    Calcium 8.9 - 10.3 mg/dL 8.5   8.0   8.1      Recent Labs    06/26/21 0029 06/26/21 0354 06/26/21 0900  GLUCAP 122* 118* 102*     Imaging: No results found.  Physical Exam  General: Cachectic male, sitting  comfortably in bed HEENT: TC present with ATC in place  CV: RRR, no murmurs heard  Pulmonary: Coarse breath sounds, normal WOB  Abdominal: G-tube site with no erythema or drainage, sutures present  Extremities: No LE edema Skin: Warm, dry  Neuro: A&O x3 Psych: Appropriate behavior and affect, mood somewhat improved today   Assessment/Plan: David [name] is a 46 y.o. 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 Hospital course has been complicated by ICU admission requiring tracheostomy, bacteremia, concern for endocarditis, and persistent hypotension.  Principal Problem:   Acute  respiratory failure with hypoxia (HCC) Active Problems:   Laryngeal cancer (HCC)   Goals of care, counseling/discussion   Hypotension   Protein-calorie malnutrition, severe   Drug abuse and dependence ([place])   Community acquired pneumonia due to Pneumococcus [facility])   Anxiety state   Aspiration pneumonia of right lower lobe (HCC)   Supraglottic airway obstruction   Chronic HFrEF (heart failure with reduced ejection fraction) ([place])   Tracheostomy status (HCC)   Bacterial endocarditis   Pressure injury of skin   S/P percutaneous endoscopic gastrostomy (PEG) tube placement ([place])  # Acute respiratory failure with hypoxia # s/p tracheostomy placement # Aspiration pneumonia, treated Tracheostomy placed 5/13 for respiratory compromise in ICU. Completed 5-day course of Flagyl and Rocephin on 5/21 for aspiration pneumonia. Has been weaned off supplemental O2 with O2 sats remaining above 95%. Seen by ENT on 5/26 with plan to change trach to cuffless but no plans for decannulation at this time.  - ENT following, appreciate recs - Routine trach care - Increase guafeinesin from 5 mL to 10 mL q6h PRN secretions - Continuous pulse ox monitoring  - ENT follow-up in 3 to 4 months for routine cancer  surveillance and repeat laryngoscopy to assess readiness for decannulation   # Endocarditis  # Strep pneumo bacteremia, resolved  WBC wnl at 4.6. TTE with thickened anterior mitral leaflet with possible small hypermobile density concerning for vegetation. Per Cardiology, patient not a candidate for TEE given his history of radiation for laryngeal cancer. Patient remains afebrile, plan to continue empiric treatment for endocarditis based on TTE findings and history of recent IVDU. Midline placed on 5/27.  - IV Ceftriaxone x 4 weeks (end date 07/06/21) - Reduce CBC to 2x week - Stop overnight vital check   # Chronic hypotension Patient with a history of chronic hypotension. SBP has remained in the 80s to 90s throughout most of hospitalization.  - Midodrine 10 mg TID  - IVF PRN to keep MAP > 60   # Protein-calorie malnutrition, severe PEG tube successfully placed on 5/24. Repeat MBBS showed improvement in swallowing. SLP recommended advancement to regular solids with honey thick liquids. RD planning on changing continuous feeds to bolus today.  - RD following, appreciate recs - CBG q4h   # Normocytic anemia, stable  Most recent Hgb 8.1 from 5/25. Will continue to monitor. 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  - Methadone 50 mg daily  - Nicotine patch daily    # Chronic pain  # R calf pain Patient continues to report pain. Escalation of pain regiment continues to be limited by patient's persistent hypotension. - Ibuprofen 600 mg q6 hours  - Tylenol 1000 mg TID - Duloxetine to 60 mg daily  - Oxycodone 5 mg q6h PRN mod/severe pain  - Methadone 50 mg daily - Lidocaine patch daily  - Exedrin tab q6h PRN headache    # Anxiety, Depression  # Goals of care Continue full code/full scope of care and do what we can to improve patient's  comfort while he's here. Will change level of care from Progressive to Med-Surg so telemetry can be discontinued.  - Change LOC to Med-Surg - Hydroxyzine 50 mg q6h PRN anxiety  - Duloxetine to 60 mg daily   # Hepatitis C  HCV RNA 792,000 on admission, indicative of  chronic infection. ID recommended outpatient treatment.  Diet: Regular with honey thick liquids, TF IVF: None VTE: Lovenox CODE: Full  Prior to Admission Living Arrangement: Home with mother  Anticipated Discharge Location: TBD Barriers to Discharge: IV antibiotics  Dispo: Anticipate discharge in approx 1.5 weeks  Signed: Sabino Dick, MS4 Internal Medicine Teaching Service [phone] Please contact the on call pager after 5 pm and on weekends at [phone].

## 2021-06-26 NOTE — Progress Notes (Signed)
OTO HNS PROGRESS NOTE   Patient seen an examined at bedside. Doing well on PMV.  Exam shoes tracheostomy site widely patent, normal healing, no bleeding or granulation tissue.   Trach changed to Shiley size 6 cuffless without difficulty. Patient tolerated well. Trach re-secured with Posey ties, PMV replaced.   A/P: David [name] is s/p emergent tracheostomy on 06/09/2021. -On trach collar, doing well with PMV -Trach changed to cuffless without difficulty -Maintain trach, no plans for decannulation at this time -Recommend follow up at trach clinic once discharged -FU with me outpatient in 3-4 months for routine cancer surveillance. Will plan on repeat laryngoscopy at that visit to determine readiness for decannulation.   Thank you for allowing me to participate in the care of this patient. Please do not hesitate to contact me with any questions or concerns.   Jason Coop, [facility] Cell: [phone]

## 2021-06-26 NOTE — Progress Notes (Signed)
Physical Therapy Treatment Patient Details Name: David [name] MRN: [id number] DOB: 1976-01-13 Today's Date: 06/26/2021   History of Present Illness Pt adm 5/10 with acute hypoxic respiratory failure due to aspiration PNA and laryngeal obstruction due to stage IV head and neck CA. Pt had trach placed on 5/13. PEG placed PMH - CHF, malnutrition, Heroin use now on methadone    PT Comments    Patient willing to work with PT today. Brought rollator to his room and he agreed he would try to walk. Upon standing at EOB, pt began crying and returned to sitting then supine stating he was too weak. Asked if eating food would help him get stronger and what he can do to get stronger. Explained role of both nutrition and mobility (including AROM exercises while he is in bed). Patient not willing to participate in exercises at this time.    Recommendations for follow up therapy are one component of a multi-disciplinary discharge planning process, led by the attending physician.  Recommendations may be updated based on patient status, additional functional criteria and insurance authorization.  Follow Up Recommendations  Home health PT     Assistance Recommended at Discharge Frequent or constant Supervision/Assistance  Patient can return home with the following A little help with walking and/or transfers;A little help with bathing/dressing/bathroom;Assist for transportation;Assistance with cooking/housework;Help with stairs or ramp for entrance   Equipment Recommendations  Rollator (4 wheels)    Recommendations for Other Services       Precautions / Restrictions Precautions Precautions: Fall Restrictions Weight Bearing Restrictions: No     Mobility  Bed Mobility Overal bed mobility: Needs Assistance Bed Mobility: Supine to Sit, Sit to Supine     Supine to sit: Supervision Sit to supine: Supervision   General bed mobility comments: sat EOB ~30 seconds and then stood; immediately  upon return to sit he returned to supine    Transfers Overall transfer level: Needs assistance Equipment used: Rollator (4 wheels) Transfers: Sit to/from Stand Sit to Stand: Min guard           General transfer comment: Assist for safety and lines; stood for ~5 seconds, began crying because felt too weak to walk and returned to sitting    Ambulation/Gait               General Gait Details: pt reports unable after standing and feels weak   Stairs             Wheelchair Mobility    Modified Rankin (Stroke Patients Only)       Balance Overall balance assessment: Needs assistance Sitting-balance support: Feet supported, Bilateral upper extremity supported Sitting balance-Leahy Scale: Poor     Standing balance support: Bilateral upper extremity supported, During functional activity, Reliant on assistive device for balance Standing balance-Leahy Scale: Poor                              Cognition Arousal/Alertness: Awake/alert Behavior During Therapy: WFL for tasks assessed/performed Overall Cognitive Status: Within Functional Limits for tasks assessed                                 General Comments: PMSV in place, raspy voice but able to understand pt needs        Exercises Other Exercises Other Exercises: pt refused exercises after crying spell. He did put his feet against foot  board and pushed himself up in the bed and encouraged to continue to do that throughout the day as it will help his legs get stronger. Encouraged AROM and pt stated he will but did not want to demonstrate at this time.    General Comments General comments (skin integrity, edema, etc.): on RA; pt removed PMSV prior to activity; replaced it on return to bed and asked how he is going to get his strength back. Educated he needs to try to eat and he needs to move more      Pertinent Vitals/Pain Pain Assessment Pain Assessment: No/denies pain    Home  Living                          Prior Function            PT Goals (current goals can now be found in the care plan section) Acute Rehab PT Goals Patient Stated Goal: not stated PT Goal Formulation: With patient Time For Goal Achievement: 07/10/21 Potential to Achieve Goals: Fair Progress towards PT goals: Not progressing toward goals - comment (pt has refused multiple times)    Frequency    Min 3X/week      PT Plan Current plan remains appropriate    Co-evaluation              AM-PAC PT "6 Clicks" Mobility   Outcome Measure  Help needed turning from your back to your side while in a flat bed without using bedrails?: None Help needed moving from lying on your back to sitting on the side of a flat bed without using bedrails?: A Little Help needed moving to and from a bed to a chair (including a wheelchair)?: A Little Help needed standing up from a chair using your arms (e.g., wheelchair or bedside chair)?: A Little Help needed to walk in hospital room?: Total Help needed climbing 3-5 steps with a railing? : Total 6 Click Score: 15    End of Session   Activity Tolerance: Patient limited by fatigue;Other (comment) (began crying due to his weakness) Patient left: in bed;with call bell/phone within reach   PT Visit Diagnosis: Unsteadiness on feet (R26.81);Other abnormalities of gait and mobility (R26.89);Muscle weakness (generalized) (M62.81);Adult, failure to thrive (R62.7)     Time: 3614-4315 PT Time Calculation (min) (ACUTE ONLY): 12 min  Charges:  $Therapeutic Activity: 8-22 mins                      [name], PT Acute Rehabilitation Services  Pager [phone] Office [phone]    [name] 06/26/2021, 3:21 PM

## 2021-06-26 NOTE — Progress Notes (Signed)
Nutrition Follow-up  DOCUMENTATION CODES:   Severe malnutrition in context of chronic illness, Underweight  INTERVENTION:   Tube feeding via PEG: Transition to bolus feeds, 1 1/2 cartons of Osmolite 1.5 - TID  Start with 1/2 carton for the first bolus and increase by 1/2 carton at each bolus until reach goal (1 1/2 cartons) Administer slowly over 15-20 minutes 45 mL ProSource TF - daily  50 mL free water flush before & after each bolus  Provides 1640 kcal, 78 gm protein, 815 mL free water daily. Meal ordering with assist Continue Multivitamin w/ minerals daily Magic cup TID with meals, each supplement provides 290 kcal and 9 grams of protein  NUTRITION DIAGNOSIS:   Severe Malnutrition related to chronic illness (head and neck cancer) as evidenced by severe muscle depletion, severe fat depletion, percent weight loss (23% weight loss within 3 months). - Ongoing  GOAL:   Patient will meet greater than or equal to 90% of their needs - Being met with TF  MONITOR:   PO intake, Supplement acceptance, Labs, Weight trends, TF tolerance  REASON FOR ASSESSMENT:   Consult Assessment of nutrition requirement/status, Enteral/tube feeding initiation and management  ASSESSMENT:   46 yo male admitted with respiratory failure, suspected aspiration PNA. 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  5/12 - Cortrak placed (tip post pyloric) 5/13 - TF started; Trach placed 5/15 - diet advanced to Dysphagia 3, Honey thick liquids 5/22 - attempted placement of PEG in IR (not placed 2/2 anatomy) 5/23 - Cortrak removed 5/24 - PEG placed 5/26 - diet advanced to regular, Honey Thick liquids  Pt laying in bed, lunch tray in front of pt. Pt reports that he has been trying to eat. Discussed that we are transitioning to bolus feeds and that he will not be attached to the pump all day. Pt expressed  that he wants to feel full.  Per EMR, pt PO intake includes:  2/25: Breakfast 25%, Lunch 50% 5/29: Breakfast 25%, Lunch 25% 5/30: Breakfast 25%, Lunch 25%  Spoke with RN outside of room, discussed plan to transition bolus tomorrow due to new bottle of tube feed hanging.   Medications reviewed and include: Folic acid, MVI, Protonix, Miralax, Senokot, Thiamine, IV antibiotics  Labs reviewed: Sodium 131, Creatinine 0.41, 24 hr CBG 96-122  Diet Order:   Diet Order             Diet regular Room service appropriate? Yes; Fluid consistency: Honey Thick  Diet effective now                   EDUCATION NEEDS:   No education needs have been identified at this time  Skin:  Skin Assessment: Skin Integrity Issues: Skin Integrity Issues:: Stage II Stage II: Sacrum  Last BM:  5/30 - per pt report  Height:   Ht Readings from Last 1 Encounters:  06/07/21 5' 11"  (1.803 m)    Weight:   Wt Readings from Last 1 Encounters:  06/23/21 50.8 kg    Ideal Body Weight:  78.2 kg  BMI:  Body mass index is 15.62 kg/m.  Estimated Nutritional Needs:  Kcal:  1500-1700 Protein:  65-75 gm Fluid:  1.5-1.7 L   Hermina Barters RD, LDN Clinical Dietitian See [facility] for contact information.

## 2021-06-26 NOTE — Progress Notes (Signed)
Patient utilizing suction with mouth suction as he coughs secretions up.  Patient requests for [name] not to suction him at this time.

## 2021-06-26 NOTE — Progress Notes (Signed)
Changed patients inner cannula as well as trach collar set up.  Encouraged patient to keep aerosol trach collar on to help with secretions.

## 2021-06-27 DIAGNOSIS — I330 Acute and subacute infective endocarditis: Secondary | ICD-10-CM | POA: Diagnosis not present

## 2021-06-27 DIAGNOSIS — J9601 Acute respiratory failure with hypoxia: Secondary | ICD-10-CM | POA: Diagnosis not present

## 2021-06-27 DIAGNOSIS — E43 Unspecified severe protein-calorie malnutrition: Secondary | ICD-10-CM | POA: Diagnosis not present

## 2021-06-27 DIAGNOSIS — F411 Generalized anxiety disorder: Secondary | ICD-10-CM | POA: Diagnosis not present

## 2021-06-27 LAB — GLUCOSE, CAPILLARY
Glucose-Capillary: 103 mg/dL — ABNORMAL HIGH (ref 70–99)
Glucose-Capillary: 119 mg/dL — ABNORMAL HIGH (ref 70–99)
Glucose-Capillary: 121 mg/dL — ABNORMAL HIGH (ref 70–99)
Glucose-Capillary: 129 mg/dL — ABNORMAL HIGH (ref 70–99)
Glucose-Capillary: 146 mg/dL — ABNORMAL HIGH (ref 70–99)
Glucose-Capillary: 228 mg/dL — ABNORMAL HIGH (ref 70–99)
Glucose-Capillary: 68 mg/dL — ABNORMAL LOW (ref 70–99)
Glucose-Capillary: 94 mg/dL (ref 70–99)

## 2021-06-27 MED ORDER — GUAIFENESIN 100 MG/5ML PO LIQD
10.0000 mL | Freq: Four times a day (QID) | ORAL | Status: DC | PRN
  Administered 2021-06-28 – 2021-07-02 (×4): 10 mL
  Filled 2021-06-27 (×6): qty 10

## 2021-06-27 MED ORDER — TRAZODONE HCL 50 MG PO TABS
100.0000 mg | ORAL_TABLET | Freq: Every day | ORAL | Status: DC
  Administered 2021-06-27 – 2021-07-30 (×34): 100 mg via ORAL
  Filled 2021-06-27 (×34): qty 2

## 2021-06-27 MED ORDER — INSULIN ASPART 100 UNIT/ML IJ SOLN
0.0000 [IU] | INTRAMUSCULAR | Status: DC
  Administered 2021-06-28 – 2021-06-29 (×2): 2 [IU] via SUBCUTANEOUS
  Administered 2021-06-29: 1 [IU] via SUBCUTANEOUS
  Administered 2021-06-29: 2 [IU] via SUBCUTANEOUS
  Administered 2021-06-30: 1 [IU] via SUBCUTANEOUS
  Administered 2021-07-01: 2 [IU] via SUBCUTANEOUS
  Administered 2021-07-01: 1 [IU] via SUBCUTANEOUS
  Administered 2021-07-02: 2 [IU] via SUBCUTANEOUS
  Administered 2021-07-02 – 2021-07-06 (×5): 1 [IU] via SUBCUTANEOUS
  Administered 2021-07-06: 2 [IU] via SUBCUTANEOUS
  Administered 2021-07-07: 1 [IU] via SUBCUTANEOUS

## 2021-06-27 MED ORDER — HYDROXYZINE HCL 25 MG PO TABS
100.0000 mg | ORAL_TABLET | Freq: Four times a day (QID) | ORAL | Status: DC | PRN
  Administered 2021-06-29 – 2021-07-02 (×2): 100 mg
  Filled 2021-06-27 (×3): qty 4

## 2021-06-27 MED ORDER — KETOROLAC TROMETHAMINE 30 MG/ML IJ SOLN
30.0000 mg | Freq: Once | INTRAMUSCULAR | Status: AC
  Administered 2021-06-27: 30 mg via INTRAVENOUS
  Filled 2021-06-27: qty 1

## 2021-06-27 MED ORDER — ONDANSETRON HCL 4 MG/2ML IJ SOLN
4.0000 mg | Freq: Four times a day (QID) | INTRAMUSCULAR | Status: DC | PRN
Start: 2021-06-27 — End: 2021-08-23
  Administered 2021-06-27 – 2021-07-21 (×7): 4 mg via INTRAVENOUS
  Filled 2021-06-27 (×9): qty 2

## 2021-06-27 NOTE — Progress Notes (Signed)
PT Cancellation Note  Patient Details Name: David [name] MRN: [id number] DOB: Dec 16, 1975   Cancelled Treatment:    Reason Eval/Treat Not Completed: Other (comment)  Stopped in to ask pt if he would work with PT in 45 minutes and pt agreed. On return, pt refused stating he's having a really bad day. Attempted to persuade pt to at least try as he did 5/30 and he continued to refuse. "Don't make me feel worse than I already do."    [name], [place]  Pager [phone] Office [phone]  [name] 06/27/2021, 10:30 AM

## 2021-06-27 NOTE — Progress Notes (Addendum)
Hospital day: 21  Subjective:   Overnight events: Night team paged early this AM about anxiety.  Patient seen at bedside during morning rounds. He reports he only slept about 2 hours last night and when he woke up this morning was extremely anxious. He states that his anxiety is largely from being in pain and being hospitalized. He also endorses nausea from the change in his tube feeds.   Objective:  Vital signs in last 24 hours: Vitals:   06/27/21 0001 06/27/21 0100 06/27/21 0259 06/27/21 0320  BP: (!) 89/61 (!) 89/61 (!) 89/61 (!) 83/62  Pulse: 79 81 81 75  Resp: '18 19 19 17  '$ Temp: 98 F (36.7 C)   98 F (36.7 C)  TempSrc: Oral   Oral  SpO2: 100%  100% 97%  Weight:      Height:        Filed Weights   06/09/21 0500 06/12/21 0500 06/23/21 0500  Weight: 41.8 kg 42.1 kg 50.8 kg    Intake/Output Summary (Last 24 hours) at 06/27/2021 0724 Last data filed at 06/27/2021 0600 Gross per 24 hour  Intake 600 ml  Output 2450 ml  Net -1850 ml   Net IO Since Admission: -8,181.07 mL [06/27/21 0724]   Pertinent Labs:    Latest Ref Rng & Units 06/25/2021    9:54 AM 06/21/2021    4:22 AM 06/20/2021    1:37 AM  CBC  WBC 4.0 - 10.5 K/uL 4.9   4.9   5.4    Hemoglobin 13.0 - 17.0 g/dL 8.0   8.1   8.2    Hematocrit 39.0 - 52.0 % 25.0   24.6   25.5    Platelets 150 - 400 K/uL 217   212   223         Latest Ref Rng & Units 06/25/2021    9:54 AM 06/17/2021    1:08 AM 06/15/2021    1:27 AM  CMP  Glucose 70 - 99 mg/dL 87   110   82    BUN 6 - 20 mg/dL '13   11   14    '$ Creatinine 0.61 - 1.24 mg/dL 0.41   0.46   0.43    Sodium 135 - 145 mmol/L 131   134   130    Potassium 3.5 - 5.1 mmol/L 4.2   4.4   4.5    Chloride 98 - 111 mmol/L 94   98   94    CO2 22 - 32 mmol/L 32   31   29    Calcium 8.9 - 10.3 mg/dL 8.5   8.0   8.1      Recent Labs    06/26/21 2052 06/26/21 2132 06/27/21 0015  GLUCAP 76 124* 129*     Imaging: No results found.  Physical Exam  General: Cachectic,  uncomfortable appearing male  HEENT: Trach collar present  CV: RRR, no murmurs heard  Pulmonary: Coarse lung sounds anteriorly, normal WOB Abdominal: Soft, nondistended. G-tube present Extremities: No LE edema Skin: Warm, dry, no rashes seen  Neuro: A&O x3, no focal deficits  Psych: Appropriate behavior and affect   Assessment/Plan: David [name] is a 46 y.o. 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 Hospital course has been complicated by ICU admission requiring tracheostomy, bacteremia, concern for endocarditis, and persistent hypotension.  Principal Problem:   Acute respiratory failure  with hypoxia ([place]) Active Problems:   Laryngeal cancer (HCC)   Goals of care, counseling/discussion   Hypotension   Protein-calorie malnutrition, severe   Drug abuse and dependence ([place])   Community acquired pneumonia due to Pneumococcus [facility])   Anxiety state   Aspiration pneumonia of right lower lobe (HCC)   Supraglottic airway obstruction   Chronic HFrEF (heart failure with reduced ejection fraction) ([place])   Tracheostomy status (HCC)   Bacterial endocarditis   Pressure injury of skin   S/P percutaneous endoscopic gastrostomy (PEG) tube placement ([place])   # Acute respiratory failure with hypoxia # s/p tracheostomy placement # Aspiration pneumonia, treated Tracheostomy placed 5/13 for respiratory compromise in ICU. Completed 5-day course of Flagyl and Rocephin on 5/21 for aspiration pneumonia. Has been weaned off supplemental O2 with O2 sats remaining above 95%. ENT changed trach to cuffless on 5/30, no plans for decannulation at this time. Still feels choking sensation due to secretions.  - ENT following, appreciate recs - Routine trach care - Guaifenesin 10 mL q6h PRN secretions - Continuous pulse ox monitoring  - ENT follow-up in 3 to 4 months for routine cancer  surveillance and repeat laryngoscopy to assess readiness for decannulation   # Endocarditis  # Strep pneumo bacteremia, resolved  WBC wnl at 4.6. TTE with thickened anterior mitral leaflet with possible small hypermobile density concerning for vegetation. Per Cardiology, patient not a candidate for TEE given his history of radiation for laryngeal cancer. Patient remains afebrile, plan to continue empiric treatment for endocarditis based on TTE findings and history of recent IVDU. Midline placed on 5/27.  - IV Ceftriaxone x 4 weeks (end date 07/06/21) - Reduce CBC to 2x week - Stop overnight vital check   # Chronic hypotension Patient with a history of chronic hypotension. SBP has remained in the 80s to 90s throughout most of hospitalization.  - Midodrine 10 mg TID  - IVF PRN to keep MAP > 60   # Protein-calorie malnutrition, severe PEG tube successfully placed on 5/24. Repeat MBBS showed improvement in swallowing. SLP recommended advancement to regular solids with honey thick liquids on 5/26. Bolus feeds started this morning, adding IV Zofran PRN nausea. If patient's CBG remain normal with bolus feeds, will stop q4h monitoring tomorrow.   - Start IV Zofran 4 mg q6hr PRN nausea   - RD following, appreciate recs - CBG q4h, d/c 6/1   # Normocytic anemia, stable  Most recent Hgb 8.1 from 5/25. Will continue to monitor. 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  - Methadone 50 mg daily  - Nicotine patch daily    # Chronic pain  # R calf pain, resolved Patient continues to report pain in his back, at his trach site, and in his stomach when he coughs. Escalation of pain regiment continues to be limited by patient's persistent hypotension. - Ibuprofen 600 mg q6 hours  - Tylenol 1000 mg TID - Duloxetine to 60 mg daily  - Oxycodone 5 mg q6h PRN  mod/severe pain  - Methadone 50 mg daily - Lidocaine patch daily  - Exedrin tab q6h PRN headache    # Anxiety, Depression  # Goals of care Continue full code/full scope of care and do what we can to improve patient's comfort while he's here. Will increase hydroxyzine 50 mg to 100  mg for patient's anxiety.  - Increase hydroxyzine to 100 mg q6h PRN anxiety   - Duloxetine 60 mg daily   # Insomnia Patient reports that he has not slept fell for the last 2 nights, and that melatonin does not work. Will start Trazodone tonight to help with sleep, discussed rare SE of priapism. Trazodone may also help with patient's mood.  - Melatonin 3 mg PRN qhs  - Start Trazodone 50 mg nightly    # Hepatitis C  HCV RNA 792,000 on admission, indicative of chronic infection. ID recommended outpatient treatment.  Diet: Bolus TF TID with 50 mL FFF before and after, regular w/ honey-thick liquids  IVF: None VTE: Lovenox CODE: Full  Prior to Admission Living Arrangement: Home with mom Anticipated Discharge Location: TBD Barriers to Discharge: IV Medication  Dispo: Anticipated discharge in approx 1.5 weeks   Signed: Sabino Dick, MS4 Internal Medicine Teaching Service [phone] Please contact the on call pager after 5 pm and on weekends at [phone].

## 2021-06-27 NOTE — TOC Progression Note (Signed)
Transition of Care [facility]) - Progression Note    Patient Details  Name: Zakye Baby MRN: [id number] Date of Birth: 03/08/1975  Transition of Care [facility]) CM/SW Contact  Cyndi Bender, RN Phone Number: 06/27/2021, 2:40 PM  Clinical Narrative:    Spoke to patient's mother, [name] regarding transition needs.Mother is very hesitate about taking patient home once stable. Mother would like patient to go to skilled rehab prior to home due to weakness and trach. This RNCM explained that it is very unlikely that patient would be accepted to a skilled nursing facility due to trach, past drug use and insurance. Mother requesting to speak to MD. This RNCM notified MD.  This RNCM spoke to Herbie Baltimore with [facility] regarding private dute nursing for 8 hours daily  once patient discharges home. Robert sent form for mother to sign. This RNCM requested bedside nurse have mother sign and fax to [phone]. Clinical information faxed to [employer]   Expected Discharge Plan: [place] Barriers to Discharge: Continued Medical Work up  Expected Discharge Plan and Services Expected Discharge Plan: [place]                                               Social Determinants of Health (SDOH) Interventions    Readmission Risk Interventions     View : No data to display.

## 2021-06-28 DIAGNOSIS — J9601 Acute respiratory failure with hypoxia: Secondary | ICD-10-CM | POA: Diagnosis not present

## 2021-06-28 LAB — GLUCOSE, CAPILLARY
Glucose-Capillary: 100 mg/dL — ABNORMAL HIGH (ref 70–99)
Glucose-Capillary: 102 mg/dL — ABNORMAL HIGH (ref 70–99)
Glucose-Capillary: 114 mg/dL — ABNORMAL HIGH (ref 70–99)
Glucose-Capillary: 163 mg/dL — ABNORMAL HIGH (ref 70–99)
Glucose-Capillary: 163 mg/dL — ABNORMAL HIGH (ref 70–99)
Glucose-Capillary: 84 mg/dL (ref 70–99)

## 2021-06-28 MED ORDER — METHADONE HCL 10 MG PO TABS
50.0000 mg | ORAL_TABLET | Freq: Every day | ORAL | Status: DC
  Administered 2021-06-29 – 2021-07-06 (×8): 50 mg
  Filled 2021-06-28 (×8): qty 5

## 2021-06-28 MED ORDER — OSMOLITE 1.5 CAL PO LIQD
355.0000 mL | Freq: Three times a day (TID) | ORAL | Status: DC
  Administered 2021-06-28 – 2021-06-29 (×4): 355 mL
  Filled 2021-06-28 (×5): qty 474

## 2021-06-28 NOTE — Progress Notes (Signed)
Hospital day: 22  Subjective:   Overnight events: No acute events.   Patient seen at bedside during morning rounds. States he had a rough night due to vomiting shortly after receiving his methadone yesterday and feels like he was in "full withdrawal." Reports that sleep was "up and down." Expresses frustration towards high volume of tube feeds.    Objective:  Vital signs in last 24 hours: Vitals:   06/28/21 0000 06/28/21 0038 06/28/21 0321 06/28/21 0412  BP: (!) 88/60 122/85 (!) 81/60   Pulse: 76 (!) 101 83 90  Resp:   18 18  Temp:   97.8 F (36.6 C)   TempSrc:   Oral   SpO2: 91% 97% 95% 96%  Weight:      Height:        Filed Weights   06/09/21 0500 06/12/21 0500 06/23/21 0500  Weight: 41.8 kg 42.1 kg 50.8 kg     Intake/Output Summary (Last 24 hours) at 06/28/2021 0647 Last data filed at 06/28/2021 0100 Gross per 24 hour  Intake 120 ml  Output 800 ml  Net -680 ml   Net IO Since Admission: -8,861.07 mL [06/28/21 0647]   Pertinent Labs:    Latest Ref Rng & Units 06/25/2021    9:54 AM 06/21/2021    4:22 AM 06/20/2021    1:37 AM  CBC  WBC 4.0 - 10.5 K/uL 4.9   4.9   5.4    Hemoglobin 13.0 - 17.0 g/dL 8.0   8.1   8.2    Hematocrit 39.0 - 52.0 % 25.0   24.6   25.5    Platelets 150 - 400 K/uL 217   212   223         Latest Ref Rng & Units 06/25/2021    9:54 AM 06/17/2021    1:08 AM 06/15/2021    1:27 AM  CMP  Glucose 70 - 99 mg/dL 87   110   82    BUN 6 - 20 mg/dL '13   11   14    '$ Creatinine 0.61 - 1.24 mg/dL 0.41   0.46   0.43    Sodium 135 - 145 mmol/L 131   134   130    Potassium 3.5 - 5.1 mmol/L 4.2   4.4   4.5    Chloride 98 - 111 mmol/L 94   98   94    CO2 22 - 32 mmol/L 32   31   29    Calcium 8.9 - 10.3 mg/dL 8.5   8.0   8.1      Recent Labs    06/27/21 2229 06/27/21 2344 06/28/21 0324  GLUCAP 94 119* 100*     Imaging: No results found.  Physical Exam  General: Cachectic male, sleeping but awakens easily HEENT: Trach collar present  CV:  Deferred due to mood  Pulmonary: Deferred due to mood Abdominal: Deferred due to mood Extremities: No LE edema  Skin: Warm, dry  Neuro: A&O x3 Psych: Speaks angrily about hospitalization, staff, and "lapses in care"   Assessment/Plan: David [name] is a 46 y.o. 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 Hospital course has been complicated by ICU admission requiring tracheostomy, bacteremia, concern for endocarditis, and persistent hypotension.  Principal Problem:   Acute respiratory failure with hypoxia (HCC) Active Problems:   Laryngeal cancer (HCC)   Goals  of care, counseling/discussion   Hypotension   Protein-calorie malnutrition, severe   Drug abuse and dependence ([place])   Community acquired pneumonia due to Pneumococcus [facility])   Anxiety state   Aspiration pneumonia of right lower lobe (HCC)   Supraglottic airway obstruction   Chronic HFrEF (heart failure with reduced ejection fraction) ([place])   Tracheostomy status (HCC)   Bacterial endocarditis   Pressure injury of skin   S/P percutaneous endoscopic gastrostomy (PEG) tube placement ([place])  # Acute respiratory failure with hypoxia # s/p tracheostomy placement # Aspiration pneumonia, treated Tracheostomy placed 5/13 for respiratory compromise in ICU. Completed 5-day course of Flagyl and Rocephin on 5/21 for aspiration pneumonia. Has been weaned off supplemental O2 with O2 sats remaining above 95%. ENT changed trach to cuffless on 5/30, no plans for decannulation at this time.  - ENT following, appreciate recs - Routine trach care - Guaifenesin 10 mL q6h PRN secretions - Continuous pulse ox monitoring  - ENT follow-up in 3 to 4 months for routine cancer surveillance and repeat laryngoscopy to assess readiness for decannulation   # Endocarditis  # Strep pneumo bacteremia, resolved  WBC wnl at  4.6. TTE with thickened anterior mitral leaflet with possible small hypermobile density concerning for vegetation. Per Cardiology, patient not a candidate for TEE given his history of radiation for laryngeal cancer. Patient remains afebrile, plan to continue empiric treatment for endocarditis based on TTE findings and history of recent IVDU. Midline placed on 5/27.  - IV Ceftriaxone x 4 weeks (end date 07/06/21) - Reduce CBC to 2x week - Stop overnight vital check   # Chronic hypotension Patient with a history of chronic hypotension. SBP has remained in the 80s to 90s throughout most of hospitalization.  - Midodrine 10 mg TID  - IVF PRN to keep MAP > 60   # Protein-calorie malnutrition, severe PEG tube successfully placed on 5/24. SLP recommended advancement to regular solids with honey thick liquids on 5/26. Bolus feeds started 5/31, patient had an episode of emesis so adding IV Zofran PRN nausea. Continuing to monitor CBGs today as patient had low of 68 and high of 228 yesterday, requiring initiation of SSI. EKG obtained with normal QTc of 422.  - IV Zofran 4 mg q6hr PRN nausea - Bolus tube feeds    - RD following, appreciate recs - CBG q4h   # Normocytic anemia, stable  Most recent Hgb 8.1 from 5/25. Will continue to monitor. 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 Current Methadone use is barrier to SNF placement, will offer option of transitioning Methadone to buprenorphine which may be more acceptable to SNF.   - Methadone 50 mg daily  - Discuss transitioning Methadone to buprenorphine.  - Nicotine patch daily    # Chronic pain  Patient continues to report pain in his back, at his trach site, and in his stomach when he coughs. Escalation of pain regiment continues to be limited by patient's persistent hypotension. Administered dose of  IV Toradol yesterday and stopped Ibuprofen, patient unsure if he noticed any difference. - Tylenol 1000 mg TID - Duloxetine to 60 mg daily  - Oxycodone 5 mg q6h PRN mod/severe pain  - Methadone 50 mg daily - Lidocaine patch daily  - Exedrin tab q6h PRN headache   # SDOH  Patient was  previously homeless for 2 years before moving into his mother's house in 10/2020 after being diagnosed with head and neck cancer. Spoke to mother on 5/31, she is still hesitant to allow patient to return to her home after discharge, citing concerns about patient's treatment of her, as well as patient's IVDU. SNF not currently an option due to Methadone but will consider transitioning patient to buprenorphine as above.    # Anxiety, Depression  # Goals of care Continue full code/full scope of care and do what we can to improve patient's comfort while he's here.  - Hydroxyzine 100 mg q6h PRN anxiety   - Duloxetine 60 mg daily    # Insomnia Patient reports that sleep was up and down. Will continue Trazodone. - Melatonin 3 mg PRN qhs  - Trazodone 50 mg nightly    # Hepatitis C  HCV RNA 792,000 on admission, indicative of chronic infection. ID recommended outpatient treatment.   Diet: Bolus TF, regular diet with honey-thick liqiud IVF: None VTE: Lovenox CODE: Full  Prior to Admission Living Arrangement: Homeless, moved in with mother in 10/2020 Anticipated Discharge Location: TBD Barriers to Discharge: IV Antibiotics Dispo: Anticipated discharge in in 1.5 weeks  Signed: Sabino Dick, MS4 Internal Medicine Teaching Service [phone] Please contact the on call pager after 5 pm and on weekends at [phone].

## 2021-06-28 NOTE — Progress Notes (Signed)
Patient seen today by trach team for consult.  No education is needed at this time.  All necessary equipment is at beside.   Will continue to follow for progression.  

## 2021-06-29 DIAGNOSIS — J9601 Acute respiratory failure with hypoxia: Secondary | ICD-10-CM | POA: Diagnosis not present

## 2021-06-29 LAB — GLUCOSE, CAPILLARY
Glucose-Capillary: 80 mg/dL (ref 70–99)
Glucose-Capillary: 90 mg/dL (ref 70–99)
Glucose-Capillary: 170 mg/dL — ABNORMAL HIGH (ref 70–99)
Glucose-Capillary: 133 mg/dL — ABNORMAL HIGH (ref 70–99)
Glucose-Capillary: 76 mg/dL (ref 70–99)

## 2021-06-29 MED ORDER — OSMOLITE 1.5 CAL PO LIQD
119.0000 mL | Freq: Four times a day (QID) | ORAL | Status: DC
Start: 2021-06-29 — End: 2021-07-02
  Administered 2021-06-29 – 2021-07-02 (×12): 119 mL
  Filled 2021-06-29 (×14): qty 237

## 2021-06-29 MED ORDER — OSMOLITE 1.5 CAL PO LIQD: 119.0000 mL | Freq: Three times a day (TID) | ORAL | Status: DC

## 2021-06-29 MED ORDER — PROSOURCE TF PO LIQD
45.0000 mL | Freq: Three times a day (TID) | ORAL | Status: DC
Start: 2021-06-29 — End: 2021-08-23
  Administered 2021-06-29 – 2021-08-22 (×156): 45 mL
  Filled 2021-06-29 (×160): qty 45

## 2021-06-29 NOTE — Progress Notes (Signed)
Pt has declined to wear ATC and to be suctioned today. RT informed pt on the importance of both. Pt understands.

## 2021-06-29 NOTE — Progress Notes (Signed)
Speech Language Pathology Treatment: Dysphagia;Passy [place] Speaking valve  Patient Details Name: Michele Kerlin MRN: [id number] DOB: 12-07-1975 Today's Date: 06/29/2021 Time: 2458-0998 SLP Time Calculation (min) (ACUTE ONLY): 10 min  Assessment / Plan / Recommendation Clinical Impression  Pt has been expressing frustration about difficulty swallowing during the day, SLP called to visit pt. [name] is able to place PMSV independently, though breath support for conversation is quite low, requiring requests for revision. [name] reports he is no longer interested in attempt any PO intake because it makes him gag and cough and then his secretions get quite bad and he doesn't like that feeling. SLP agreed with pt observation; intake of liquids and solids lead to pharyngeal residue, penetration and need for constant efforts to expectorate and re-swallow. He continues to refuse SLP offers to assist him with eating to try any different strategies. He is too tired and wants me to go away.   Realistically, pt has reached a point of weakness and severe dysphagia due to head and neck radiation that is not likely to improve without significant effort from pt to participate in swallowing exercises. Pt went through a course of OP therapy prior to admit but pt struggled to participate and be compliant. Pt is allowed at this point to try unrestricted solids because 1) is able to masticate and 2) he would have no interest in purees 3)swallowing solids requires effort and struggle and are likely not enjoyable but have a low risk of aspiration. If he chooses not to eat because he does not enjoy it, that is ok, but if he wants to try foods that is also OK. Honey thick liquids are still recommended due to high risk of aspiration with thin liquids. Unless pt expresses some desire to participate in therapy sessions however, SLP interventions are not helpful. SLP will continue to check 1x a week to offer therapy program and check  for safety with PMSV. Otherwise, we are not pursuing regular therapy at this time.    HPI HPI: 46yo male with past medical history of laryngeal cancer s/p chemo and radiation and heroin abuse presented to ED on 06/06/21 from home with complaints of shortness of breath and not being able to eat for several days. Patient was admitted on 06/06/2021 with acute hypoxic respiratory failure with sepsis secondary to aspiration pneumonia, elevated D-dimer, AKI. Developed worsening respiratory distress requiring emergent tracheostomy on 5/13. Patient got PEG on 06/20/21.      SLP Plan         Recommendations for follow up therapy are one component of a multi-disciplinary discharge planning process, led by the attending physician.  Recommendations may be updated based on patient status, additional functional criteria and insurance authorization.    Recommendations  Diet recommendations: Regular;Honey-thick liquid Liquids provided via: Cup                            Ayen Viviano, Katherene Ponto  06/29/2021, 2:24 PM

## 2021-06-29 NOTE — Progress Notes (Signed)
Nutrition Follow-up  DOCUMENTATION CODES:   Severe malnutrition in context of chronic illness, Underweight  INTERVENTION:   Tube feeding via PEG: Continue 1/2 carton Osmolite 1.5 - QID Administer slowly over 15-20 minutes Increased to 45 mL ProSource TF - TID 50 mL free water flush before & after each bolus  Provides 831 kcal, 63 gm protein, 562 mL free water daily. (Meeting 46% of pt daily caloric needs) Continue Multivitamin w/ minerals daily per tube Discontinue Magic cup Recommend obtaining standing scale weight  NUTRITION DIAGNOSIS:   Severe Malnutrition related to chronic illness (head and neck cancer) as evidenced by severe muscle depletion, severe fat depletion, percent weight loss (23% weight loss within 3 months). - Ongoing  GOAL:   Patient will meet greater than or equal to 90% of their needs - Being met with TF  MONITOR:   PO intake, Supplement acceptance, Labs, Weight trends, TF tolerance  REASON FOR ASSESSMENT:   Consult Assessment of nutrition requirement/status, Enteral/tube feeding initiation and management  ASSESSMENT:   46 yo male admitted with respiratory failure, suspected aspiration PNA. 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  5/12 - Cortrak placed (tip post pyloric) 5/13 - TF started; Trach placed 5/15 - diet advanced to Dysphagia 3, Honey thick liquids 5/22 - attempted placement of PEG in IR (not placed 2/2 anatomy) 5/23 - Cortrak removed 5/24 - PEG placed 5/26 - diet advanced to regular, Honey Thick liquids 5/30 - transitioned to bolus feeds  Pt reports that he has a coughing spell every time that he eats or drinks food, states that this is the reason he does not eat more. Reached out to SLP to reevaluate pt.  No PO intakes recorded within EMR since previous RD visit. Breakfast tray in pt tray, primarily untouched.  Pt reports that his  first bolus they bolus and entire large bottle of tube feed. Pt endorses that bolus feeds make him bloated. Reviewed the importance of TF due to pt inability to consume adequate nutrition.  Per RN, pt tolerated ~300 mL bolus this morning. MD team would like to stick with 1/2 carton bolus at a time, this would only meet half of pt daily needs at this time. Goal is 4 1/2 carton Osmolite 1.5 per day.  RD suspects that pt most recent weight from 1 week ago is inaccurate. Wound recommend obtaining a standing scale weight. RD asked pt for weight, pt declined.   Medications reviewed and include: Folic acid, SSI, MVI, Protonix, Miralax, Senokot, Thiamine, IV antibiotics  Labs reviewed: Sodium 131, Creatinine 0.41, 24 hr CBG 96-122  Diet Order:   Diet Order             Diet regular Room service appropriate? Yes; Fluid consistency: Honey Thick  Diet effective now                   EDUCATION NEEDS:   No education needs have been identified at this time  Skin:  Skin Assessment: Skin Integrity Issues: Skin Integrity Issues:: Stage II Stage II: Sacrum  Last BM:  5/30 - per pt report  Height:   Ht Readings from Last 1 Encounters:  06/07/21 5' 11"  (1.803 m)    Weight:   Wt Readings from Last 1 Encounters:  06/23/21 50.8 kg    Ideal Body Weight:  78.2 kg  BMI:  Body mass index is 15.62 kg/m.  Estimated Nutritional Needs:  Kcal:  1500-1700 Protein:  65-75 gm Fluid:  1.5-1.7 L   Hermina Barters RD, LDN Clinical Dietitian See [facility] for contact information.

## 2021-06-29 NOTE — Progress Notes (Signed)
Hospital day: 23  Subjective:   Overnight events: No acute overnight events.   Patient seen at bedside this afternoon. Reports sleep was better last night. Notes that he still has trouble eating po because of coughing and thick secretions, feels like prior swallow tests have not mimicked the conditions of his hospital bed and have had him sit up straight, which he does not currently do. Reports being pleased that he was able to talk with RD about feeds today. He also states he was able to get his gaming console to work.    Objective:  Vital signs in last 24 hours: Vitals:   06/28/21 2309 06/28/21 2355 06/29/21 0403 06/29/21 0431  BP: 103/73  90/66   Pulse: 83 (!) 18 67   Resp: '18  17 17  '$ Temp: 97.9 F (36.6 C)  98 F (36.7 C)   TempSrc: Oral  Oral   SpO2: 100% 96% 92% 96%  Weight:      Height:        Filed Weights   06/09/21 0500 06/12/21 0500 06/23/21 0500  Weight: 41.8 kg 42.1 kg 50.8 kg     Intake/Output Summary (Last 24 hours) at 06/29/2021 0647 Last data filed at 06/29/2021 0400 Gross per 24 hour  Intake --  Output 1500 ml  Net -1500 ml   Net IO Since Admission: -10,361.07 mL [06/29/21 0647]   Pertinent Labs:    Latest Ref Rng & Units 06/25/2021    9:54 AM 06/21/2021    4:22 AM 06/20/2021    1:37 AM  CBC  WBC 4.0 - 10.5 K/uL 4.9   4.9   5.4    Hemoglobin 13.0 - 17.0 g/dL 8.0   8.1   8.2    Hematocrit 39.0 - 52.0 % 25.0   24.6   25.5    Platelets 150 - 400 K/uL 217   212   223         Latest Ref Rng & Units 06/25/2021    9:54 AM 06/17/2021    1:08 AM 06/15/2021    1:27 AM  CMP  Glucose 70 - 99 mg/dL 87   110   82    BUN 6 - 20 mg/dL '13   11   14    '$ Creatinine 0.61 - 1.24 mg/dL 0.41   0.46   0.43    Sodium 135 - 145 mmol/L 131   134   130    Potassium 3.5 - 5.1 mmol/L 4.2   4.4   4.5    Chloride 98 - 111 mmol/L 94   98   94    CO2 22 - 32 mmol/L 32   31   29    Calcium 8.9 - 10.3 mg/dL 8.5   8.0   8.1      Recent Labs    06/28/21 2018  06/28/21 2340 06/29/21 0403  GLUCAP 84 163* 80     Imaging: No results found.  Physical Exam  General: Cachectic male, sitting in bed in NAD HEENT: Normocephalic, cuffless TC in place  CV: RRR, no murmurs heard Pulmonary: Coarse breathe sounds anteriorly, normal WOB on room air Abdominal: Soft, non-tender. G-tube site clean, dry, well healing incision  Extremities: No LE edema  Skin: Warm, dry  Neuro: A&O x3 Psych: Slightly improved mood today   Assessment/Plan: David [name] is a 46 y.o. 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 Hospital course has been complicated by ICU admission requiring tracheostomy, bacteremia, concern for endocarditis, and persistent hypotension.  Principal Problem:   Acute respiratory failure with hypoxia (HCC) Active Problems:   Laryngeal cancer (HCC)   Goals of care, counseling/discussion   Hypotension   Protein-calorie malnutrition, severe   Drug abuse and dependence ([place])   Community acquired pneumonia due to Pneumococcus [facility])   Anxiety state   Aspiration pneumonia of right lower lobe (HCC)   Supraglottic airway obstruction   Chronic HFrEF (heart failure with reduced ejection fraction) ([place])   Tracheostomy status (HCC)   Bacterial endocarditis   Pressure injury of skin   S/P percutaneous endoscopic gastrostomy (PEG) tube placement ([place])  # Acute respiratory failure with hypoxia # s/p tracheostomy placement # Aspiration pneumonia, treated Tracheostomy placed 5/13 for respiratory compromise in ICU. Completed 5-day course of Flagyl and Rocephin on 5/21 for aspiration pneumonia. Has been weaned off supplemental O2 with O2 sats remaining above 95%. ENT changed trach to cuffless on 5/30, no plans for decannulation at this time.  - ENT following, appreciate recs - Routine trach care - Guaifenesin 10 mL q6h  PRN secretions - Continuous pulse ox monitoring  - ENT follow-up in 3 to 4 months for routine cancer surveillance and repeat laryngoscopy to assess readiness for decannulation   # Endocarditis  # Strep pneumo bacteremia, resolved  TTE with thickened anterior mitral leaflet with possible small hypermobile density concerning for vegetation. Per Cardiology, patient not a candidate for TEE given his history of radiation for laryngeal cancer. Patient remains afebrile, plan to continue empiric treatment for endocarditis based on TTE findings and history of recent IVDU. Midline placed on 5/27.  - IV Ceftriaxone x 4 weeks (end date 07/06/21) - Reduce CBC to weekly    # Chronic hypotension Patient with a history of chronic hypotension. SBP has remained in the 80s to 90s throughout most of hospitalization.  - Midodrine 10 mg TID  - IVF PRN to keep MAP > 60   # Protein-calorie malnutrition, severe PEG tube successfully placed on 5/24. SLP recommended advancement to regular solids with honey thick liquids on 5/26. Bolus feeds started 5/31, patient had an episode of emesis so adding IV Zofran PRN nausea. Continuing to monitor CBGs as we adjust bolus feeds. EKG obtained 6/1 with normal QTc of 422. Patient states that current bolus goal of 1.5 cans makes him feel bloated and would like to try 0.5 cans TID for now.  - IV Zofran 4 mg q6hr PRN nausea - Bolus tube feeds TID with 0.5 can     - RD following, appreciate recs - CBG q4h   # Normocytic anemia, stable  Most recent Hgb 8.1 from 5/25. Rechecking CBC on 6/4.  - Reduce CBC to weekly  - Transfuse for Hgb < 7    # Polysubstance use disorder  OUD on methadone, recent IVDU prior to hospitalization, was smoking 2-3 packs of cigarettes daily. Current Methadone use is barrier to SNF placement, will offer option of transitioning Methadone to buprenorphine which may be more acceptable to SNF, although SW does not think that will make a significant difference as  recent IVDU and trach are also barriers.  - Methadone 50 mg daily  - Nicotine patch daily    # Chronic pain  Patient continues to report pain in his back, at his trach site, and in his stomach when he coughs. Escalation of pain regiment continues to be limited by  patient's persistent hypotension.  - Tylenol 1000 mg TID - Duloxetine 60 mg daily  - Oxycodone 5 mg q6h PRN mod/severe pain  - Methadone 50 mg daily - Lidocaine patch daily  - Exedrin tab q6h PRN headache    # Anxiety, Depression  # Goals of care Continue full code/full scope of care and do what we can to improve patient's comfort while he's here.  - Hydroxyzine 100 mg q6h PRN anxiety   - Duloxetine 60 mg daily    # Insomnia Patient reports that he has had some improvement in sleep since starting Trazodone. Will continue Trazodone 100 mg. - Melatonin 3 mg PRN qhs  - Trazodone 100 mg nightly    # Hepatitis C  HCV RNA 792,000 on admission, indicative of chronic infection. ID recommended outpatient treatment.  Diet: Bolus TF, regular with honey thick liquid  IVF: None VTE: Lovenox CODE: Full  Prior to Admission Living Arrangement: Homeless, residing with mother since 10/2020 Anticipated Discharge Location: Pending Barriers to Discharge: IV antibiotics  Dispo: Anticipated discharge in 1.5 weeks   Signed: Sabino Dick, MS4 Internal Medicine Teaching Service [phone] Please contact the on call pager after 5 pm and on weekends at [phone].

## 2021-06-29 NOTE — Progress Notes (Signed)
Physical Therapy Treatment Patient Details Name: David [name] MRN: [id number] DOB: 10/20/1975 Today's Date: 06/29/2021   History of Present Illness Pt adm 5/10 with acute hypoxic respiratory failure due to aspiration PNA and laryngeal obstruction due to stage IV head and neck CA. Pt had trach placed on 5/13. PEG placed PMH - CHF, malnutrition, Heroin use now on methadone    PT Comments    Pt admitted with above diagnosis. Pt continues to refused OOB mobility.  Pt was conversant intiially smiling and laughing.  Pt asked PT to get his cigarette out of the closet and when PT told pt that this is a non smoking campus, pts demeanor changed and he refused to walk with therapy.  Did leave rollator in room in hopes that pt may get up later today or this weekend with nursing.  Will follow up next week.  May have to d/c pt if he doesn't agree to work with PT as he has had multiple refusals. Pt currently with functional limitations due to balance and endurance deficits. Pt will benefit from skilled PT to increase their independence and safety with mobility to allow discharge to the venue listed below.      Recommendations for follow up therapy are one component of a multi-disciplinary discharge planning process, led by the attending physician.  Recommendations may be updated based on patient status, additional functional criteria and insurance authorization.  Follow Up Recommendations  Home health PT     Assistance Recommended at Discharge Frequent or constant Supervision/Assistance  Patient can return home with the following A little help with walking and/or transfers;A little help with bathing/dressing/bathroom;Assist for transportation;Assistance with cooking/housework;Help with stairs or ramp for entrance   Equipment Recommendations  Rollator (4 wheels)    Recommendations for Other Services       Precautions / Restrictions Precautions Precautions: Fall Restrictions Weight Bearing  Restrictions: No     Mobility  Bed Mobility Overal bed mobility: Needs Assistance Bed Mobility: Supine to Sit, Sit to Supine     Supine to sit: Supervision Sit to supine: Supervision   General bed mobility comments: Pt did some bed exercises while nurse was giving meds to pt. Pt conversing and even laughing and stating he would get up after meds. Pt asked if he could get the cigarette out of the closet and this PT informed him this is a non smoking campus and that I cannot give him the cigarette. Pts mood changed and he adamantly refused to get OOB with PT.  Nurse talked with pt about PT coming later and pt intiailly stated "no" and then finally said "maybe".    Transfers                        Ambulation/Gait                   Stairs             Wheelchair Mobility    Modified Rankin (Stroke Patients Only)       Balance                                            Cognition Arousal/Alertness: Awake/alert Behavior During Therapy: WFL for tasks assessed/performed Overall Cognitive Status: Within Functional Limits for tasks assessed  General Comments: PMSV in place, raspy voice but able to understand pt needs        Exercises General Exercises - Upper Extremity Shoulder Flexion: AROM, Both, 5 reps, Supine General Exercises - Lower Extremity Ankle Circles/Pumps: AROM, Both, 5 reps, Supine Heel Slides: AROM, Both, 5 reps, Supine    General Comments General comments (skin integrity, edema, etc.): on RA with trach with PMV in place. VSS      Pertinent Vitals/Pain Pain Assessment Pain Assessment: Faces Faces Pain Scale: Hurts whole lot Pain Location: generalized Pain Descriptors / Indicators: Grimacing Pain Intervention(s): Limited activity within patient's tolerance, Monitored during session, Repositioned    Home Living                          Prior Function             PT Goals (current goals can now be found in the care plan section) Acute Rehab PT Goals Patient Stated Goal: not stated Progress towards PT goals: Not progressing toward goals - comment (self limiting)    Frequency    Min 3X/week      PT Plan Current plan remains appropriate    Co-evaluation              AM-PAC PT "6 Clicks" Mobility   Outcome Measure  Help needed turning from your back to your side while in a flat bed without using bedrails?: None Help needed moving from lying on your back to sitting on the side of a flat bed without using bedrails?: A Little Help needed moving to and from a bed to a chair (including a wheelchair)?: A Little Help needed standing up from a chair using your arms (e.g., wheelchair or bedside chair)?: A Little Help needed to walk in hospital room?: Total Help needed climbing 3-5 steps with a railing? : Total 6 Click Score: 15    End of Session   Activity Tolerance: Patient limited by fatigue (self limiting) Patient left: in bed;with call bell/phone within reach;with nursing/sitter in room Nurse Communication: Mobility status PT Visit Diagnosis: Unsteadiness on feet (R26.81);Other abnormalities of gait and mobility (R26.89);Muscle weakness (generalized) (M62.81);Adult, failure to thrive (R62.7)     Time: 1000-1025 PT Time Calculation (min) (ACUTE ONLY): 25 min  Charges:  $Therapeutic Exercise: 23-37 mins                     Darrien Laakso M,PT Acute Rehab Services [phone] (pager)    Alvira Philips 06/29/2021, 3:30 PM

## 2021-06-30 DIAGNOSIS — J9601 Acute respiratory failure with hypoxia: Secondary | ICD-10-CM | POA: Diagnosis not present

## 2021-06-30 LAB — GLUCOSE, CAPILLARY
Glucose-Capillary: 105 mg/dL — ABNORMAL HIGH (ref 70–99)
Glucose-Capillary: 107 mg/dL — ABNORMAL HIGH (ref 70–99)
Glucose-Capillary: 107 mg/dL — ABNORMAL HIGH (ref 70–99)
Glucose-Capillary: 137 mg/dL — ABNORMAL HIGH (ref 70–99)
Glucose-Capillary: 67 mg/dL — ABNORMAL LOW (ref 70–99)
Glucose-Capillary: 70 mg/dL (ref 70–99)
Glucose-Capillary: 91 mg/dL (ref 70–99)

## 2021-06-30 MED ORDER — DEXTROSE 50 % IV SOLN: 12.5000 g | INTRAVENOUS | Status: DC

## 2021-06-30 NOTE — Progress Notes (Signed)
IMTS Daily Note  Subjective: He reports doing well, he is more relaxed this morning and feels okay.  He notes being on suboxone products in the past and not having success with staying clean.  He would prefer to stay on Methadone.  Tube feeding was improved yesterday.  PEG In place and working appropriately, mild pain when he coughs.   Objective:  Vital signs in last 24 hours: Vitals:   06/29/21 2350 06/30/21 0000 06/30/21 0350 06/30/21 0745  BP:  (!) 88/76    Pulse: 72 76  84  Resp: '20 20 20 18  '$ Temp:  97.8 F (36.6 C)    TempSrc:  Axillary    SpO2: 95% 95% 97%   Weight:      Height:       Gen: Thin man lying in bed, no distress Eyes: Anicteric sclerae HENT: Trach in place, trach collar in place, no inflammation or erythema around the site Abd: Scaphoid, PEG In place, no drainage on bandage, no erythema Skin: Patches on chest, appears to be related to hygiene, no rash on exposed skin.   Assessment/Plan:  Acute respiratory failure with hypoxia - improved Laryngeal cancer- Treated Community acquired pneumonia due to Pneumococcus  - resolved Aspiration pneumonia of right lower lobe - resolved Supraglottic airway obstruction Tracheostomy status - Doing well, appeared comfortable this morning, wearing speaking valve for trach - ENT following for routine trach care, follow up planned outpatient as well - Guaifenesin PRN - Continuous pulse ox  Bacterial endocarditis with strep pneumo bacteremia - IV rocephin X 4 weeks (end date 07/06/21) - CBC on Monday 6/4    Chronic Hypotension - Continue midodrine - IVF prn only    Protein-calorie malnutrition, severe S/P percutaneous endoscopic gastrostomy (PEG) tube placement - Zofran PRN - Bolus tube feeds - Allow oral intake as tolerated - RD following, bolus goal of 1.5 cans, but moving up slowly.  On 0.5 cans TID for now.     Opioid Use disorder on chronic methadone Chronic pain - Discussed possible switch to buprenorphine  products, and he notes not having good results with these medications in the past - Continue methadone - Continue nicotine patch - Tylenol, duloxetine, exedrin also continued - Breakthrough oxycodone being used about twice per day    Anxiety Depression - Continue hydroxyzine and duloxetine  Insomnia - Continue melatonin and trazodone  Dispo: Anticipated discharge in approximately 6-7 day(s).   Sid Falcon, MD 06/30/2021, 8:03 AM

## 2021-06-30 NOTE — Progress Notes (Signed)
Patient morning CBG 67. Patient is now eating breakfast and will recheck after meal.

## 2021-07-01 LAB — CBC
HCT: 25.9 % — ABNORMAL LOW (ref 39.0–52.0)
Hemoglobin: 8.4 g/dL — ABNORMAL LOW (ref 13.0–17.0)
MCH: 30.1 pg (ref 26.0–34.0)
MCHC: 32.4 g/dL (ref 30.0–36.0)
MCV: 92.8 fL (ref 80.0–100.0)
Platelets: 289 10*3/uL (ref 150–400)
RBC: 2.79 MIL/uL — ABNORMAL LOW (ref 4.22–5.81)
RDW: 16 % — ABNORMAL HIGH (ref 11.5–15.5)
WBC: 5.6 10*3/uL (ref 4.0–10.5)
nRBC: 0 % (ref 0.0–0.2)

## 2021-07-01 LAB — BASIC METABOLIC PANEL
Anion gap: 5 (ref 5–15)
BUN: 17 mg/dL (ref 6–20)
CO2: 31 mmol/L (ref 22–32)
Calcium: 8.8 mg/dL — ABNORMAL LOW (ref 8.9–10.3)
Chloride: 98 mmol/L (ref 98–111)
Creatinine, Ser: 0.43 mg/dL — ABNORMAL LOW (ref 0.61–1.24)
GFR, Estimated: >60 mL/min (ref >60)
Glucose, Bld: 101 mg/dL — ABNORMAL HIGH (ref 70–99)
Potassium: 4.3 mmol/L (ref 3.5–5.1)
Sodium: 134 mmol/L — ABNORMAL LOW (ref 135–145)

## 2021-07-01 LAB — GLUCOSE, CAPILLARY
Glucose-Capillary: 103 mg/dL — ABNORMAL HIGH (ref 70–99)
Glucose-Capillary: 122 mg/dL — ABNORMAL HIGH (ref 70–99)
Glucose-Capillary: 123 mg/dL — ABNORMAL HIGH (ref 70–99)
Glucose-Capillary: 134 mg/dL — ABNORMAL HIGH (ref 70–99)
Glucose-Capillary: 161 mg/dL — ABNORMAL HIGH (ref 70–99)
Glucose-Capillary: 190 mg/dL — ABNORMAL HIGH (ref 70–99)
Glucose-Capillary: 99 mg/dL (ref 70–99)

## 2021-07-01 NOTE — Plan of Care (Signed)
  Problem: Activity: Goal: Ability to tolerate increased activity will improve Outcome: Progressing   Problem: Respiratory: Goal: Ability to maintain a clear airway will improve Outcome: Progressing   Problem: Health Behavior/Discharge Planning: Goal: Ability to manage health-related needs will improve Outcome: Progressing   

## 2021-07-01 NOTE — Progress Notes (Signed)
Patient changed inner cannula at bedside independently. He stated that he felt like it needed to be changed. Inner cannula taken out and replaced with one at bedside. Respiratory paged to clarify if the supplies at the bedside were correct and appropriate. Patient now has in a 6 Shiley reusable cannula. No signs of respiratory distress. Will continue to monitor.

## 2021-07-01 NOTE — Progress Notes (Signed)
NAME:  David [name], MRN:  [id number], DOB:  04/07/1975, LOS: 25 ADMISSION DATE:  06/06/2021  Subjective  Patient evaluated at bedside this AM. Doing well this morning, no further episodes of nausea/vomiting. Tube feeds have been going well over the last day.   Objective   Blood pressure 103/77, pulse 80, temperature 98.5 F (36.9 C), temperature source Oral, resp. rate 20, height '5\' 11"'$  (1.803 m), weight 50.8 kg, SpO2 99 %.     Intake/Output Summary (Last 24 hours) at 07/01/2021 1414 Last data filed at 07/01/2021 0434 Gross per 24 hour  Intake 1622.33 ml  Output 400 ml  Net 1222.33 ml   Filed Weights   06/09/21 0500 06/12/21 0500 06/23/21 0500  Weight: 41.8 kg 42.1 kg 50.8 kg   Physical Exam: General: Cachectic, chronically ill-appearing person laying in bed in no acute distress HENT: Trach, trach collar in place.  CV: Regular rate, rhythm. No murmurs appreciated. Distal pulses 2+ Pulm: Normal respiratory effort with trach. Clear to ausculation bilaterally. Abdomen: PEG in place, no drainage or erythema surrounding Skin: Warm, dry. No rashes appreciated.   Labs       Latest Ref Rng & Units 07/01/2021    1:48 AM 06/25/2021    9:54 AM 06/21/2021    4:22 AM  CBC  WBC 4.0 - 10.5 K/uL 5.6   4.9   4.9    Hemoglobin 13.0 - 17.0 g/dL 8.4   8.0   8.1    Hematocrit 39.0 - 52.0 % 25.9   25.0   24.6    Platelets 150 - 400 K/uL 289   217   212        Latest Ref Rng & Units 07/01/2021    1:48 AM 06/25/2021    9:54 AM 06/17/2021    1:08 AM  BMP  Glucose 70 - 99 mg/dL 101   87   110    BUN 6 - 20 mg/dL '17   13   11    '$ Creatinine 0.61 - 1.24 mg/dL 0.43   0.41   0.46    Sodium 135 - 145 mmol/L 134   131   134    Potassium 3.5 - 5.1 mmol/L 4.3   4.2   4.4    Chloride 98 - 111 mmol/L 98   94   98    CO2 22 - 32 mmol/L 31   32   31    Calcium 8.9 - 10.3 mg/dL 8.8   8.5   8.0     Assessment & Plan:  Principal Problem:   Acute respiratory failure with hypoxia (HCC) Active  Problems:   Laryngeal cancer (HCC)   Goals of care, counseling/discussion   Hypotension   Protein-calorie malnutrition, severe   Drug abuse and dependence ([place])   Community acquired pneumonia due to Pneumococcus [facility])   Anxiety state   Aspiration pneumonia of right lower lobe ([place])   Supraglottic airway obstruction   Chronic HFrEF (heart failure with reduced ejection fraction) (HCC)   Tracheostomy status (HCC)   Bacterial endocarditis   Pressure injury of skin   S/P percutaneous endoscopic gastrostomy (PEG) tube placement (HCC)  #Acute hypoxic respiratory failure #CAP 2/2 Strep pneumococcus  #Aspiration pneumonia #Supraglottic airway obstruction requiring intubation, now on trach #Tracheostomy dependence No significant changes, continues to do well with trach and speaking valve. Appreciate ENT's assistance in trach care, will plan to follow-up as outpatient after discharge.  - Routine trach care - Guaifenesin every 6 hours as  needed - Continuous pulse oximetry - F/u outpatient ENT  #Bacterial endocarditis #Streptococcal pneumococcus  bacteremia Continuing for antibiotics for four weeks. No new complaints, will continue to monitor for any signs of seeding.  - IV ceftriaxone daily, last day 6/9 - Weekly CBC  #Severe protein-calorie malnutrition #PEG tube Doing well on current bolus feeds, appreciate RD's assistance. - Bolus feeds, 0.5 cans three times daily - Oral intake as tolerated - Zofran PRN - be cautious with QT given on chronic methadone  #Chronic hypotension MAP goal >60. Continue midodrine.  #Opioid use disorder #Chronic methadone use Previously on Suboxone, did not tolerate well. Therefore will continue with methadone. - Continue daily methadone  #Anxiety #Insomnia Stable, continue medications. - Daily duloxetine - Trazodone QHS - Hydroxyzine '100mg'$  every 6 hours as needed  Best practice:  DIET: TF, Regular IVF: n/a DVT PPX: lovenox BOWEL: senokot-s,  miralax CODE: FULL FAM COM: n/a  Sanjuan Dame, MD Internal Medicine Resident PGY-2 PAGER: [phone] 07/01/2021 2:14 PM  If after hours (below), please contact on-call pager: [phone] 5PM-7AM Monday-Friday 1PM-7AM Saturday-Sunday

## 2021-07-02 DIAGNOSIS — J9601 Acute respiratory failure with hypoxia: Secondary | ICD-10-CM | POA: Diagnosis not present

## 2021-07-02 LAB — GLUCOSE, CAPILLARY
Glucose-Capillary: 106 mg/dL — ABNORMAL HIGH (ref 70–99)
Glucose-Capillary: 121 mg/dL — ABNORMAL HIGH (ref 70–99)
Glucose-Capillary: 121 mg/dL — ABNORMAL HIGH (ref 70–99)
Glucose-Capillary: 141 mg/dL — ABNORMAL HIGH (ref 70–99)
Glucose-Capillary: 87 mg/dL (ref 70–99)
Glucose-Capillary: 97 mg/dL (ref 70–99)

## 2021-07-02 MED ORDER — OSMOLITE 1.5 CAL PO LIQD
237.0000 mL | ORAL | Status: AC
  Administered 2021-07-03: 237 mL
  Filled 2021-07-02: qty 237

## 2021-07-02 MED ORDER — OSMOLITE 1.5 CAL PO LIQD
119.0000 mL | Freq: Three times a day (TID) | ORAL | Status: AC
  Administered 2021-07-03 (×3): 119 mL
  Filled 2021-07-02 (×3): qty 237

## 2021-07-02 NOTE — TOC Progression Note (Signed)
Transition of Care [facility]) - Progression Note    Patient Details  Name: David [name] MRN: [id number] Date of Birth: 1975-02-13  Transition of Care [facility]) CM/SW [place], RN Phone Number: 07/02/2021, 2:24 PM  Clinical Narrative:     Mother, Nyoka Cowden, called to notify RNCM that she will not take patient home.  Expected Discharge Plan: [place] Barriers to Discharge: Continued Medical Work up  Expected Discharge Plan and Services Expected Discharge Plan: [place]                                               Social Determinants of Health (SDOH) Interventions    Readmission Risk Interventions     View : No data to display.

## 2021-07-02 NOTE — Plan of Care (Signed)
  Problem: Activity: Goal: Ability to tolerate increased activity will improve Outcome: Progressing   Problem: Respiratory: Goal: Ability to maintain adequate ventilation will improve Outcome: Progressing   Problem: Education: Goal: Knowledge of General Education information will improve Description: Including pain rating scale, medication(s)/side effects and non-pharmacologic comfort measures Outcome: Progressing   Problem: Activity: Goal: Risk for activity intolerance will decrease Outcome: Progressing   Problem: Nutrition: Goal: Adequate nutrition will be maintained Outcome: Progressing   Problem: Coping: Goal: Level of anxiety will decrease Outcome: Progressing

## 2021-07-02 NOTE — Progress Notes (Signed)
Sutures removed from g-tube.  Site is clean.  Tube working well.  David [name] 9:36 AM 07/02/2021

## 2021-07-02 NOTE — Progress Notes (Signed)
HD#26 SUBJECTIVE:  Patient Summary: David [name] is a 46 y.o. 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 Hospital course has been complicated by ICU admission requiring tracheostomy, bacteremia, concern for endocarditis, and persistent hypotension.  Overnight Events: Patient changed inner cannula independently without complication.  Interim History: Patient evaluated at bedside and appeared to be feeling well. He was awake and interactive, answering questions and providing information to assist [name] in discharge planning. He states that he previously thought he could go to his mom's house on discharge but that this no longer seems to be a feasible option. He has a voucher for section 8 housing and was actively searching for a new home when we came into his room. He is very worried about being homeless and living in a tent again, causing him to be unable to really take care of himself. He is eager to get his health back and is encouraged by feeling like he is slowly regaining his strength over the last several days.  OBJECTIVE:  Vital Signs: Vitals:   07/01/21 2340 07/02/21 0354 07/02/21 0400 07/02/21 0426  BP: 101/71 110/86    Pulse: (!) 103 (!) 104    Resp: 20 20    Temp: 97.8 F (36.6 C) 97.8 F (36.6 C)    TempSrc: Oral Oral    SpO2: 96% 93% 93% 94%  Weight:      Height:       Supplemental O2:  Trach SpO2: 94 % O2 Flow Rate (L/min): 5 L/min FiO2 (%): 28 %  Filed Weights   06/09/21 0500 06/12/21 0500 06/23/21 0500  Weight: 41.8 kg 42.1 kg 50.8 kg     Intake/Output Summary (Last 24 hours) at 07/02/2021 2878 Last data filed at 07/02/2021 0600 Gross per 24 hour  Intake 180 ml  Output 625 ml  Net -445 ml   Net IO Since Admission: -10,533.74 mL [07/02/21 0633]  Physical Exam: Constitutional:Chronically-ill gentleman resting comfortably in  bed, no acute distress. Cardio:Regular rate and rhythm. No murmurs, rubs, gallops. Pulm: Normal work of breathing. Does have moderate secretions that he self-suctions from trach and mouth. No respiratory distress. Skin:Skin is warm and dry. Neuro:Alert and oriented x3, no focal deficit noted. Psych:Pleasant mood and affect.  Patient Lines/Drains/Airways Status     Active Line/Drains/Airways     Name Placement date Placement time Site Days   Peripheral IV 06/07/21 20 G 1.88" Anterior;Proximal;Right Forearm 06/07/21  0807  Forearm  25   Midline Single Lumen 67/67/20 Left Basilic 10 cm 0 cm 94/70/96  2836  Basilic  9   Gastrostomy/Enterostomy Gastrostomy 18 Fr. LUQ 06/20/21  0832  LUQ  12   Incision (Closed) 06/09/21 Neck Other (Comment) 06/09/21  0327  -- 23   Incision (Closed) 06/20/21 Abdomen Other (Comment) 06/20/21  0810  -- 12   Incision - 3 Ports Abdomen 1: Right;Lateral 2: Right;Lateral;Medial 3: Umbilicus 62/94/76  5465  -- 12   Tracheostomy Other (Comment) 6 mm Uncuffed 06/09/21  0300  6 mm  23   Pressure Injury 06/16/21 Sacrum Medial Stage 2 -  Partial thickness loss of dermis presenting as a shallow open injury with a red, pink wound bed without slough. 06/16/21  2000  -- 16             ASSESSMENT/PLAN:  Assessment: Principal Problem:   Acute respiratory failure with hypoxia (HCC) Active Problems:  Laryngeal cancer (HCC)   Goals of care, counseling/discussion   Hypotension   Protein-calorie malnutrition, severe   Drug abuse and dependence ([place])   Community acquired pneumonia due to Pneumococcus [facility])   Anxiety state   Aspiration pneumonia of right lower lobe (HCC)   Supraglottic airway obstruction   Chronic HFrEF (heart failure with reduced ejection fraction) ([place])   Tracheostomy status (HCC)   Bacterial endocarditis   Pressure injury of skin   S/P percutaneous endoscopic gastrostomy (PEG) tube placement ([place])   Plan: #Acute hypoxic respiratory  failure #CAP 2/2 Strep pneumococcus  #Aspiration pneumonia #Supraglottic airway obstruction requiring intubation, now on trach #Tracheostomy dependence Patient continues to do well with trach and speaking valve. He has secretions and is coughing up a fair amount of mucus during our conversation which he self-suctions from trach and mouth. -ENT assisting with trach care, appreciate their assistance  -F/u as outpatient at discharge -Continue routine trach care -Guaifenesin every 6 hours as needed -Continuous pulse oximetry   #Bacterial endocarditis #Streptococcal pneumococcus  bacteremia Currently on four-week course of antibiotics. No new signs of infection, no murmur appreciated on exam today. -Continue IV ceftriaxone 2 g daily, last day 06/09 -TOC consult placed to assist in housing once antibiotic course is complete 06/09 -Weekly CBC   #Severe protein-calorie malnutrition #PEG tube Sutures removed from G tube site this morning without complication. He is tolerating current regimen for feeds and has not had recurrent nausea or vomiting.  -RD consulted, appreciate their assistance -Continue bolus feeds, 0.5 cans three times daily -Encourage PO intake as tolerated -Zofran PRN - be cautious with QT given on chronic methadone   #Chronic hypotension BP stable, 110/86 this morning. -MAP goal >60 -Continue midodrine.   #Opioid use disorder #Chronic methadone use Patient does not want to transition to Suboxone from Methadone as he says that he did not tolerate it well. He is tolerating current regimen well without complaints. -Continue daily methadone   #Anxiety #Insomnia Stable. He does endorse increased anxiety especially during coughing fits that at times make him feel like he is choking but these quickly resolve. -Continue duloxetine -Continue trazodone QHS -Continue hydroxyzine '100mg'$  every 6 hours as needed  Best Practice: Diet: Tube feeds IVF: None VTE: enoxaparin  (LOVENOX) injection 30 mg Start: 06/20/21 1000 Code: Full AB: Ceftriaxone 2g IV daily, last day 06/09 DISPO: Anticipated discharge pending Home enviroment access/layout and IV antibiotics.  Signature: Farrel Gordon, D.O.  Internal Medicine Resident, PGY-1 [facility]  Pager: [phone] 6:33 AM, 07/02/2021   Please contact the on call pager after 5 pm and on weekends at [phone].

## 2021-07-02 NOTE — Progress Notes (Signed)
Nutrition Follow-up  DOCUMENTATION CODES:   Severe malnutrition in context of chronic illness, Underweight  INTERVENTION:   Tube feeding via PEG: Day 1: 1 carton daily + 1/2 carton - TID (provides 1009 kcal and 38 gm protein)  Day 2: 2 cartons BID + 1/2 carton - BID (provides 1187 kcal and 45 gm protein) Day 3: 3 cartons TID + 1/2 carton daily (provides 1364 kcal and 53 gm protein) Day 4: 4 cartons - QID (provides 1542 kcal and 60 gm protein) Continue 45 mL [employer] TF - TID 50 mL free water flush before & after each bolus   Continue Multivitamin w/ minerals daily per tube Consider increasing bowel regimen due to no BM x4 days  NUTRITION DIAGNOSIS:   Severe Malnutrition related to chronic illness (head and neck cancer) as evidenced by severe muscle depletion, severe fat depletion, percent weight loss (23% weight loss within 3 months). - Ongoing  GOAL:   Patient will meet greater than or equal to 90% of their needs - Being met with TF  MONITOR:   PO intake, Labs, Weight trends, TF tolerance  REASON FOR ASSESSMENT:   Consult Assessment of nutrition requirement/status, Enteral/tube feeding initiation and management  ASSESSMENT:   46 yo male admitted with respiratory failure, suspected aspiration PNA. 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  5/12 - Cortrak placed (tip post pyloric) 5/13 - TF started; Trach placed 5/15 - diet advanced to Dysphagia 3, Honey thick liquids 5/22 - attempted placement of PEG in IR (not placed 2/2 anatomy) 5/23 - Cortrak removed 5/24 - PEG placed 5/26 - diet advanced to regular, Honey Thick liquids 5/30 - transitioned to bolus feeds  Pt reports that he tolerated current regimen for bolus feeds over the weekend (1/2 carton - QID). Reports that this morning he did have some nausea but was resolved with medication. He states that he is having  a lot of mucus at night that causes him to cough and keeps him awake.  Discussed plan with pt to increase bolus amounts towards goal starting tomorrow (6/5). Discussed with MD team, would like slow advancements to meet 4 cartons per day, current plan as below: Day 1: 1 carton daily + 1/2 carton - TID Day 2: 2 cartons BID + 1/2 carton - BID Day 3: 3 cartons TID + 1/2 carton daily Day 4: 4 cartons - QID  Pt overall goal is for 4.5 cartons per day (provides 1600 kcal, 68 gm protein, and 815 mL free water). If pt is unable to tolerate bolus feeds will need to transition back to continuous TF to meet needs and prevent further weight loss.   Discussed needing a new weight, pt reports that he believes that he has lost even more weight. RN was able to weight pt today, pt new weight is 36.7 kg; pt has lost weight since admission.   Per EMR, pt with no BM in 4 days, may need to increase bowel regimen.  Medications reviewed and include: Folic acid, SSI, MVI, Protonix, Miralax, Senokot, Thiamine, IV antibiotics  Labs reviewed: Sodium 134, Creatinine 0.43  Diet Order:   Diet Order             Diet regular Room service appropriate? Yes; Fluid consistency: Honey Thick  Diet effective now                   EDUCATION NEEDS:   No education  needs have been identified at this time  Skin:  Skin Assessment: Skin Integrity Issues: Skin Integrity Issues:: Stage II Stage II: Sacrum  Last BM:  6/1  Height:   Ht Readings from Last 1 Encounters:  06/07/21 5' 11"  (1.803 m)   Weight:  Wt Readings from Last 1 Encounters:  07/02/21 36.7 kg   Ideal Body Weight:  78.2 kg  BMI:  Body mass index is 11.28 kg/m.  Estimated Nutritional Needs:  Kcal:  1500-1700 Protein:  65-75 gm Fluid:  1.5-1.7 L   Hermina Barters RD, LDN Clinical Dietitian See [facility] for contact information.

## 2021-07-03 ENCOUNTER — Other Ambulatory Visit: Payer: Self-pay

## 2021-07-03 ENCOUNTER — Institutional Professional Consult (permissible substitution): Payer: Medicaid Other | Admitting: Behavioral Health

## 2021-07-03 DIAGNOSIS — J9601 Acute respiratory failure with hypoxia: Secondary | ICD-10-CM | POA: Diagnosis not present

## 2021-07-03 LAB — GLUCOSE, CAPILLARY
Glucose-Capillary: 112 mg/dL — ABNORMAL HIGH (ref 70–99)
Glucose-Capillary: 135 mg/dL — ABNORMAL HIGH (ref 70–99)
Glucose-Capillary: 82 mg/dL (ref 70–99)
Glucose-Capillary: 92 mg/dL (ref 70–99)
Glucose-Capillary: 95 mg/dL (ref 70–99)
Glucose-Capillary: 99 mg/dL (ref 70–99)

## 2021-07-03 MED ORDER — OSMOLITE 1.5 CAL PO LIQD
237.0000 mL | Freq: Two times a day (BID) | ORAL | Status: DC
  Administered 2021-07-04 – 2021-07-05 (×3): 237 mL
  Filled 2021-07-03 (×4): qty 237

## 2021-07-03 NOTE — Progress Notes (Signed)
Physical Therapy Treatment Patient Details Name: David [name] MRN: [id number] DOB: [birth year] Today's Date: 07/03/2021   History of Present Illness Pt adm 5/10 with acute hypoxic respiratory failure due to aspiration PNA and laryngeal obstruction due to stage IV head and neck CA. Pt had trach placed on 5/13. PEG placed PMH - CHF, malnutrition, Heroin use now on methadone    PT Comments    Patient agreed to ambulate today. Walked 170 ft with rollator and minguard assist on RA with only one brief standing rest break. Patient had walked yesterday in room with rollator and nursing assist, therefore left rollator in his room for greater participation.    Recommendations for follow up therapy are one component of a multi-disciplinary discharge planning process, led by the attending physician.  Recommendations may be updated based on patient status, additional functional criteria and insurance authorization.  Follow Up Recommendations  Home health PT (may progress to no PT needs)     Assistance Recommended at Discharge Intermittent Supervision/Assistance  Patient can return home with the following A little help with walking and/or transfers;A little help with bathing/dressing/bathroom;Assist for transportation;Assistance with cooking/housework;Help with stairs or ramp for entrance   Equipment Recommendations  Rollator (4 wheels)    Recommendations for Other Services       Precautions / Restrictions Precautions Precautions: Fall Restrictions Weight Bearing Restrictions: No     Mobility  Bed Mobility Overal bed mobility: Needs Assistance Bed Mobility: Supine to Sit, Sit to Supine     Supine to sit: Supervision Sit to supine: Supervision        Transfers Overall transfer level: Needs assistance Equipment used: Rollator (4 wheels) Transfers: Sit to/from Stand Sit to Stand: Min guard           General transfer comment: pulled to stand on rollator with brakes  locked    Ambulation/Gait Ambulation/Gait assistance: Min guard Gait Distance (Feet): 170 Feet Assistive device: Rollator (4 wheels) Gait Pattern/deviations: Step-through pattern, Decreased stride length, Trunk flexed, Narrow base of support Gait velocity: decr     General Gait Details: pt and RN report pt walked in room yesterday with rollator (left in room by previous PT); required only one brief standign rest break   Stairs             Wheelchair Mobility    Modified Rankin (Stroke Patients Only)       Balance Overall balance assessment: Needs assistance Sitting-balance support: Feet supported, Bilateral upper extremity supported Sitting balance-Leahy Scale: Good     Standing balance support: Bilateral upper extremity supported, During functional activity, Reliant on assistive device for balance Standing balance-Leahy Scale: Poor                              Cognition Arousal/Alertness: Awake/alert Behavior During Therapy: WFL for tasks assessed/performed Overall Cognitive Status: Within Functional Limits for tasks assessed                                 General Comments: PMSV in place, raspy voice but able to understand pt needs        Exercises      General Comments        Pertinent Vitals/Pain      Home Living  Prior Function            PT Goals (current goals can now be found in the care plan section) Acute Rehab PT Goals Patient Stated Goal: not stated PT Goal Formulation: With patient Time For Goal Achievement: 07/10/21 Potential to Achieve Goals: Fair Progress towards PT goals: Progressing toward goals    Frequency    Min 3X/week      PT Plan Current plan remains appropriate    Co-evaluation              AM-PAC PT "6 Clicks" Mobility   Outcome Measure  Help needed turning from your back to your side while in a flat bed without using bedrails?:  None Help needed moving from lying on your back to sitting on the side of a flat bed without using bedrails?: None Help needed moving to and from a bed to a chair (including a wheelchair)?: A Little Help needed standing up from a chair using your arms (e.g., wheelchair or bedside chair)?: A Little Help needed to walk in hospital room?: A Little Help needed climbing 3-5 steps with a railing? : A Lot 6 Click Score: 19    End of Session   Activity Tolerance: Patient tolerated treatment well Patient left: in bed;with call bell/phone within reach Nurse Communication: Mobility status PT Visit Diagnosis: Unsteadiness on feet (R26.81);Other abnormalities of gait and mobility (R26.89);Muscle weakness (generalized) (M62.81);Adult, failure to thrive (R62.7)     Time: 1330-1340 PT Time Calculation (min) (ACUTE ONLY): 10 min  Charges:  $Gait Training: 8-22 mins                      [name], PT [place]  Office [phone]    [name] 07/03/2021, 1:58 PM

## 2021-07-03 NOTE — Progress Notes (Signed)
HD#27 SUBJECTIVE:  Patient Summary: David [name] is a 46 y.o. 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 Hospital course has been complicated by ICU admission requiring tracheostomy, bacteremia, concern for endocarditis, and persistent hypotension.  Overnight Events: None  Interim History: Patient continues to feel well today. He seems a little discouraged as finding housing with his section 8 housing voucher but will continue to keep looking. He is understandably concerned about safe discharge in his state of health. He is eager to gain weight back that he has lost during this admission and understands that we need to increase his tube feed amounts and eat as much of his food as he can tolerate. He tolerated getting himself out of bed this morning and walking to the restroom which was further encouragement for him in getting his strength back. He reports less secretions so far today than yesterday.  OBJECTIVE:  Vital Signs: Vitals:   07/03/21 0333 07/03/21 0343 07/03/21 0751 07/03/21 0805  BP: '96/66 90/66 92/67 '$   Pulse: 79 85 94 100  Resp: '18 18 16 16  '$ Temp:  97.8 F (36.6 C) 98.1 F (36.7 C)   TempSrc:  Axillary Oral   SpO2: 92% 93% 90% 92%  Weight:      Height:       Supplemental O2:  Trach, room air SpO2: 92 % O2 Flow Rate (L/min): 5 L/min FiO2 (%): 21 %  Filed Weights   06/12/21 0500 06/23/21 0500 07/02/21 1230  Weight: 42.1 kg 50.8 kg 36.7 kg    No intake or output data in the 24 hours ending 07/03/21 1105 Net IO Since Admission: -10,733.74 mL [07/03/21 1105]  Physical Exam: Constitutional:Chronically-ill and cachectic gentleman resting comfortably in bed, no acute distress. Neck:Trach collar in place. Cardio:Regular rate and rhythm. No murmurs, rubs, gallops. Pulm:Clear to auscultation bilaterally.  NID:POEU thin extremities, no  edema.  Skin:Warm and dry. Neuro:Alert and oriented x3. No focal deficit noted. Psych:Pleasant mood and affect.  Patient Lines/Drains/Airways Status     Active Line/Drains/Airways     Name Placement date Placement time Site Days   Peripheral IV 06/07/21 20 G 1.88" Anterior;Proximal;Right Forearm 06/07/21  0807  Forearm  26   Midline Single Lumen 23/53/61 Left Basilic 10 cm 0 cm 44/31/54  0086  Basilic  10   Gastrostomy/Enterostomy Gastrostomy 18 Fr. LUQ 06/20/21  0832  LUQ  13   Incision (Closed) 06/09/21 Neck Other (Comment) 06/09/21  0327  -- 24   Incision (Closed) 06/20/21 Abdomen Other (Comment) 06/20/21  0810  -- 13   Incision - 3 Ports Abdomen 1: Right;Lateral 2: Right;Lateral;Medial 3: Umbilicus 76/19/50  9326  -- 13   Tracheostomy Other (Comment) 6 mm Uncuffed 06/09/21  0300  6 mm  24   Pressure Injury 06/16/21 Sacrum Medial Stage 2 -  Partial thickness loss of dermis presenting as a shallow open injury with a red, pink wound bed without slough. 06/16/21  2000  -- 17             ASSESSMENT/PLAN:  Assessment: Principal Problem:   Acute respiratory failure with hypoxia (HCC) Active Problems:   Laryngeal cancer (HCC)   Goals of care, counseling/discussion   Hypotension   Protein-calorie malnutrition, severe   Drug abuse and dependence ([place])   Community acquired pneumonia due to Pneumococcus [facility])   Anxiety state   Aspiration pneumonia of right lower  lobe ([place])   Supraglottic airway obstruction   Chronic HFrEF (heart failure with reduced ejection fraction) (HCC)   Tracheostomy status (HCC)   Bacterial endocarditis   Pressure injury of skin   S/P percutaneous endoscopic gastrostomy (PEG) tube placement (HCC)   Plan: #Acute hypoxic respiratory failure #CAP 2/2 Strep pneumococcus  #Aspiration pneumonia #Supraglottic airway obstruction requiring intubation, now on trach #Tracheostomy dependence Patient continues to do well with trach and speaking valve. He has had  less secretions thus far today compared to prior days and feels like episodes of coughing these up come and go. -ENT assisting with trach care, appreciate their assistance             -F/u as outpatient at discharge -Continue routine trach care -Guaifenesin every 6 hours as needed -Continuous pulse oximetry   #Bacterial endocarditis #Streptococcal pneumococcus  bacteremia Currently on four-week course of antibiotics. No new signs of infection, no new murmur appreciated on exam. -Continue IV ceftriaxone 2 g daily, last day 06/09 -TOC consult placed to assist in housing once antibiotic course is complete 06/09 -Weekly CBC   #Severe protein-calorie malnutrition #PEG tube Patient is tolerating tube feeds and PO intake well with good appetite. He is in need of increased tube feed volumes which RD is helping to facilitate. No nausea or vomiting.  -RD consulted, appreciate their assistance -Continue bolus feeds, 1 can once today with 0.5 cans TID. Will titrate over next 3 days to 1 can QID. -Encourage PO intake as tolerated -Zofran PRN - be cautious with QT given on chronic methadone   #Chronic hypotension BP stable, 99/71 (MAP 80) this morning. -MAP goal >60 -Continue midodrine.   #Opioid use disorder #Chronic methadone use Patient does not want to transition to Suboxone from Methadone as he says that he did not tolerate it well. He is tolerating current regimen well without complaints. -Continue daily methadone   #Anxiety #Insomnia Stable.  -Continue duloxetine -Continue trazodone QHS -Continue hydroxyzine '100mg'$  every 6 hours as needed  Best Practice: Diet: Tube feeds IVF: None VTE: enoxaparin (LOVENOX) injection 30 mg Start: 06/20/21 1000 Code: Full AB: Ceftriaxone 2g IV daily, last day 06/09 DISPO: Anticipated discharge pending Home enviroment access/layout and IV antibiotics.  Signature: Farrel Gordon, D.O.  Internal Medicine Resident, PGY-1 [facility]  Pager: [phone] 11:05 AM, 07/03/2021   Please contact the on call pager after 5 pm and on weekends at [phone].

## 2021-07-03 NOTE — Plan of Care (Signed)
  Problem: Activity: Goal: Ability to tolerate increased activity will improve Outcome: Progressing   Problem: Respiratory: Goal: Ability to maintain adequate ventilation will improve Outcome: Progressing Goal: Ability to maintain a clear airway will improve Outcome: Progressing   Problem: Education: Goal: Knowledge of General Education information will improve Description: Including pain rating scale, medication(s)/side effects and non-pharmacologic comfort measures Outcome: Progressing   Problem: Activity: Goal: Risk for activity intolerance will decrease Outcome: Progressing   Problem: Nutrition: Goal: Adequate nutrition will be maintained Outcome: Progressing   Problem: Coping: Goal: Level of anxiety will decrease Outcome: Progressing

## 2021-07-03 NOTE — TOC Progression Note (Signed)
Transition of Care [facility]) - Progression Note    Patient Details  Name: Nameer Summer MRN: [id number] Date of Birth: September 01, 1975  Transition of Care [facility]) CM/SW [place], LCSW Phone Number: 07/03/2021, 2:07 PM  Clinical Narrative:    CSW contacted [place] [facility] in [place] at request of [employer]. CSW spoke with Jeannie Fend. Regarding referral. She reported she can review as long as patient has facility medicaid (and not [employer]). CSW emailed financial counseling to follow up as it appears patient only has [employer] at this time.    Expected Discharge Plan: [place] Barriers to Discharge: Continued Medical Work up  Expected Discharge Plan and Services Expected Discharge Plan: [place]                                               Social Determinants of Health (SDOH) Interventions    Readmission Risk Interventions     View : No data to display.

## 2021-07-04 DIAGNOSIS — J9601 Acute respiratory failure with hypoxia: Secondary | ICD-10-CM | POA: Diagnosis not present

## 2021-07-04 LAB — GLUCOSE, CAPILLARY
Glucose-Capillary: 92 mg/dL (ref 70–99)
Glucose-Capillary: 93 mg/dL (ref 70–99)
Glucose-Capillary: 111 mg/dL — ABNORMAL HIGH (ref 70–99)
Glucose-Capillary: 104 mg/dL — ABNORMAL HIGH (ref 70–99)
Glucose-Capillary: 101 mg/dL — ABNORMAL HIGH (ref 70–99)

## 2021-07-04 MED ORDER — OSMOLITE 1.5 CAL PO LIQD
119.0000 mL | Freq: Two times a day (BID) | ORAL | Status: AC
Start: 2021-07-04 — End: 2021-07-04
  Administered 2021-07-04 (×2): 119 mL
  Filled 2021-07-04 (×3): qty 237

## 2021-07-04 NOTE — Progress Notes (Signed)
HD#28 SUBJECTIVE:  Patient Summary: David [name] is a 46 y.o. 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 Hospital course has been complicated by ICU admission requiring tracheostomy, bacteremia, concern for endocarditis, and persistent hypotension.  Overnight Events: None  Interim History: Patient is doing well this morning with no complaints. He tolerated increasing his tube feed yesterday and is aware that we will increase this amount again today. He has a full breakfast tray that he intends to finish if able. He tells me that his mom and brother visited him yesterday but no real progress as far as dispo came from the visit. He is aware that our TOC/SW team is working hard to help find him safe placement.  OBJECTIVE:  Vital Signs: Vitals:   07/04/21 0327 07/04/21 0400 07/04/21 0806 07/04/21 0811  BP: (!) 81/65 (!) 88/67  (!) 88/75  Pulse: 79 86 87 84  Resp: '16  18 16  '$ Temp: 98.2 F (36.8 C)   98.4 F (36.9 C)  TempSrc: Oral   Oral  SpO2: 94% 93%  95%  Weight:      Height:       Supplemental O2:  Trach collar SpO2: 95 % O2 Flow Rate (L/min): 5 L/min FiO2 (%): 21 %  Filed Weights   06/23/21 0500 07/02/21 1230  Weight: 50.8 kg 36.7 kg     Intake/Output Summary (Last 24 hours) at 07/04/2021 1020 Last data filed at 07/04/2021 3893 Gross per 24 hour  Intake 229 ml  Output 775 ml  Net -546 ml   Net IO Since Admission: -11,279.74 mL [07/04/21 1020]  Physical Exam: Constitutional:Chronically ill and cachectic gentleman resting comfortably in no acute distress. Cardio:Regular rate and rhythm. No murmurs, rubs, or gallops. Pulm:Tolerating trach collar well, no accessory muscle use. Abdomen:Soft, nontender, nondistended. TDS:KAJGO all extremities spontaneously. Skin:Warm and dry. Neuro:Alert and oriented x3. No focal deficit  noted. Psych:Pleasant mood and affect.  Patient Lines/Drains/Airways Status     Active Line/Drains/Airways     Name Placement date Placement time Site Days   Peripheral IV 06/07/21 20 G 1.88" Anterior;Proximal;Right Forearm 06/07/21  0807  Forearm  27   Midline Single Lumen 11/57/26 Left Basilic 10 cm 0 cm 20/35/59  7416  Basilic  11   Gastrostomy/Enterostomy Gastrostomy 18 Fr. LUQ 06/20/21  0832  LUQ  14   Incision (Closed) 06/09/21 Neck Other (Comment) 06/09/21  0327  -- 25   Incision (Closed) 06/20/21 Abdomen Other (Comment) 06/20/21  0810  -- 14   Incision - 3 Ports Abdomen 1: Right;Lateral 2: Right;Lateral;Medial 3: Umbilicus 38/45/36  4680  -- 14   Tracheostomy Other (Comment) 6 mm Uncuffed 06/09/21  0300  6 mm  25   Pressure Injury 06/16/21 Sacrum Medial Stage 2 -  Partial thickness loss of dermis presenting as a shallow open injury with a red, pink wound bed without slough. 06/16/21  2000  -- 18             ASSESSMENT/PLAN:  Assessment: Principal Problem:   Acute respiratory failure with hypoxia (HCC) Active Problems:   Laryngeal cancer (HCC)   Goals of care, counseling/discussion   Hypotension   Protein-calorie malnutrition, severe   Drug abuse and dependence ([place])   Community acquired pneumonia due to Pneumococcus [facility])   Anxiety state   Aspiration pneumonia of right lower lobe (HCC)   Supraglottic airway obstruction  Chronic HFrEF (heart failure with reduced ejection fraction) (HCC)   Tracheostomy status (HCC)   Bacterial endocarditis   Pressure injury of skin   S/P percutaneous endoscopic gastrostomy (PEG) tube placement (HCC)   Plan: #Acute hypoxic respiratory failure #CAP 2/2 Strep pneumococcus  #Aspiration pneumonia #Supraglottic airway obstruction requiring intubation, now on trach #Tracheostomy dependence Patient continues to do well with trach and speaking valve.  -ENT assisting with trach care, appreciate their assistance             -F/u as  outpatient at discharge -Continue routine trach care -Guaifenesin every 6 hours as needed -Continuous pulse oximetry   #Bacterial endocarditis #Streptococcal pneumococcus  bacteremia Currently on four-week course of antibiotics. No new signs of infection, no new murmur appreciated on exam. -Continue IV ceftriaxone 2 g daily, last day 06/09 -TOC consult placed to assist in housing once antibiotic course is complete 06/09, appreciate their assistance -Weekly CBC   #Severe protein-calorie malnutrition #PEG tube Patient is tolerating tube feeds and PO intake well with continued good appetite and no recurrence of nausea and vomiting. -RD consulted, appreciate their assistance -Continue bolus feeds, 2 cans twice today with 0.5 cans BID. Will titrate over next 2 days to 1 can QID. -Encourage PO intake as tolerated -Zofran PRN - be cautious with QT given on chronic methadone   #Chronic hypotension BP stable. -MAP goal >60 -Continue midodrine.   #Opioid use disorder #Chronic methadone use Patient does not want to transition to Suboxone from Methadone as he says that he did not tolerate it well. He is tolerating current regimen well without complaints. -Continue daily methadone   #Anxiety #Insomnia Stable.  -Continue duloxetine -Continue trazodone QHS -Continue hydroxyzine '100mg'$  every 6 hours as needed   Best Practice: Diet: Tube feeds, regular IVF: None VTE: enoxaparin (LOVENOX) injection 30 mg Start: 06/20/21 1000 Code: Full AB: Ceftriaxone 2g IV daily, last day 06/09 DISPO: Anticipated discharge pending Home enviroment access/layout and IV antibiotics.  Signature: Farrel Gordon, D.O.  Internal Medicine Resident, PGY-1 [facility]  Pager: [phone] 10:20 AM, 07/04/2021   Please contact the on call pager after 5 pm and on weekends at [phone].

## 2021-07-04 NOTE — Progress Notes (Signed)
PT Cancellation Note  Patient Details Name: Pacey Altizer MRN: [id number] DOB: 1976/01/10   Cancelled Treatment:    Reason Eval/Treat Not Completed: Fatigue/lethargy limiting ability to participate  Patient refused mobility at this time due to wanting to sleep. Stated PT could check on him tomorrow. Rollator is in room for pt to use with nursing.    [place]  Office [phone]  [name] 07/04/2021, 11:04 AM

## 2021-07-05 DIAGNOSIS — J9601 Acute respiratory failure with hypoxia: Secondary | ICD-10-CM | POA: Diagnosis not present

## 2021-07-05 LAB — GLUCOSE, CAPILLARY
Glucose-Capillary: 104 mg/dL — ABNORMAL HIGH (ref 70–99)
Glucose-Capillary: 108 mg/dL — ABNORMAL HIGH (ref 70–99)
Glucose-Capillary: 115 mg/dL — ABNORMAL HIGH (ref 70–99)
Glucose-Capillary: 74 mg/dL (ref 70–99)
Glucose-Capillary: 92 mg/dL (ref 70–99)
Glucose-Capillary: 98 mg/dL (ref 70–99)

## 2021-07-05 MED ORDER — OSMOLITE 1.5 CAL PO LIQD
237.0000 mL | Freq: Three times a day (TID) | ORAL | Status: DC
  Administered 2021-07-05 – 2021-07-06 (×3): 237 mL
  Filled 2021-07-05 (×4): qty 237

## 2021-07-05 MED ORDER — OSMOLITE 1.5 CAL PO LIQD
119.0000 mL | Freq: Once | ORAL | Status: AC
Start: 2021-07-05 — End: 2021-07-05
  Administered 2021-07-05: 119 mL
  Filled 2021-07-05: qty 237

## 2021-07-05 NOTE — Progress Notes (Signed)
HD#29 SUBJECTIVE:  Patient Summary: David [name] is a 46 y.o. 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 Hospital course has been complicated by ICU admission requiring tracheostomy, bacteremia, concern for endocarditis, and persistent hypotension.  Overnight Events: None  Interim History: Patient is doing well today with no complaints. He has bene tolerating advancement of tube feeds well. He reiterates his desire to keep his medical information from his mom despite her continued information requests.  OBJECTIVE:  Vital Signs: Vitals:   07/04/21 2114 07/04/21 2341 07/05/21 0326 07/05/21 0808  BP: 102/68     Pulse: 79 (!) 106 77 78  Resp: '18 18 18 18  '$ Temp: 97.8 F (36.6 C)     TempSrc: Oral     SpO2: 97% 97% 100% 100%  Weight:      Height:       Supplemental O2:  Trach collar SpO2: 100 % O2 Flow Rate (L/min): 5 L/min FiO2 (%): 28 %  Filed Weights   06/23/21 0500 07/02/21 1230  Weight: 50.8 kg 36.7 kg     Intake/Output Summary (Last 24 hours) at 07/05/2021 0811 Last data filed at 07/04/2021 1830 Gross per 24 hour  Intake --  Output 600 ml  Net -600 ml   Net IO Since Admission: -11,579.74 mL [07/05/21 0811]  Physical Exam: Constitutional:Chronically ill and cachectic gentleman resting comfortably in no acute distress. Cardio:Regular rate and rhythm. No murmurs, rubs, or gallops. Pulm:Tolerating trach collar well, no accessory muscle use. Abdomen:Soft, nontender, nondistended. WYO:VZCHY all extremities spontaneously. Skin:Warm and dry. Neuro:Alert and oriented x3. No focal deficit noted. Psych:Pleasant mood and affect.  Patient Lines/Drains/Airways Status     Active Line/Drains/Airways     Name Placement date Placement time Site Days   Peripheral IV 06/07/21 20 G 1.88" Anterior;Proximal;Right Forearm 06/07/21  0807  Forearm   28   Midline Single Lumen 85/02/77 Left Basilic 10 cm 0 cm 41/28/78  6767  Basilic  12   Gastrostomy/Enterostomy Gastrostomy 18 Fr. LUQ 06/20/21  0832  LUQ  15   Incision (Closed) 06/09/21 Neck Other (Comment) 06/09/21  0327  -- 26   Incision (Closed) 06/20/21 Abdomen Other (Comment) 06/20/21  0810  -- 15   Incision - 3 Ports Abdomen 1: Right;Lateral 2: Right;Lateral;Medial 3: Umbilicus 20/94/70  9628  -- 15   Tracheostomy Other (Comment) 6 mm Uncuffed 06/09/21  0300  6 mm  26   Pressure Injury 06/16/21 Sacrum Medial Stage 2 -  Partial thickness loss of dermis presenting as a shallow open injury with a red, pink wound bed without slough. 06/16/21  2000  -- 19             ASSESSMENT/PLAN:  Assessment: Principal Problem:   Acute respiratory failure with hypoxia (HCC) Active Problems:   Laryngeal cancer (HCC)   Goals of care, counseling/discussion   Hypotension   Protein-calorie malnutrition, severe   Drug abuse and dependence ([place])   Community acquired pneumonia due to Pneumococcus [facility])   Anxiety state   Aspiration pneumonia of right lower lobe (HCC)   Supraglottic airway obstruction   Chronic HFrEF (heart failure with reduced ejection fraction) (HCC)   Tracheostomy status (HCC)   Bacterial endocarditis   Pressure injury of skin   S/P percutaneous endoscopic gastrostomy (PEG) tube placement ([place])   Plan: #Acute hypoxic respiratory failure #CAP 2/2 Strep pneumococcus  #Aspiration pneumonia #Supraglottic airway obstruction  requiring intubation, now on trach #Tracheostomy dependence Patient continues to do well with trach and speaking valve. No complaints today. -ENT assisting with trach care, appreciate their assistance             -F/u as outpatient at discharge -Continue routine trach care -Guaifenesin every 6 hours as needed -Continuous pulse oximetry   #Bacterial endocarditis #Streptococcal pneumococcus  bacteremia Nearing completion of four-week course of  antibiotics with last day tomorrow 06/09. He is stable for discharge after this is complete, pending safe discharge dispo. -Continue IV ceftriaxone 2 g daily, last day 06/09 -TOC consult placed to assist in housing once antibiotic course is complete 06/09, appreciate their assistance -Weekly CBC   #Severe protein-calorie malnutrition #PEG tube Patient is tolerating tube feeds and PO intake well with continued good appetite and no recurrence of nausea and vomiting. -RD consulted, appreciate their assistance -Continue bolus feeds, 3 cans TID today with 0.5 cans once. Will titrate over next day to 1 can QID. -Encourage PO intake as tolerated -Zofran PRN - be cautious with QT given on chronic methadone   #Chronic hypotension BP stable. -MAP goal >60 -Continue midodrine.   #Opioid use disorder #Chronic methadone use Patient does not want to transition to Suboxone from Methadone as he says that he did not tolerate it well. He is tolerating current regimen well without complaints. -Continue daily methadone   #Anxiety #Insomnia Stable.  -Continue duloxetine -Continue trazodone QHS -Continue hydroxyzine '100mg'$  every 6 hours as needed    Best Practice: Diet: Tube feeds, regular IVF: None VTE: enoxaparin (LOVENOX) injection 30 mg Start: 06/20/21 1000 Code: Full AB: Ceftriaxone 2g IV daily, last day 06/09 DISPO: Anticipated discharge pending Home enviroment access/layout and IV antibiotics.  Signature: Farrel Gordon, D.O.  Internal Medicine Resident, PGY-1 [facility]  Pager: [phone] 8:11 AM, 07/05/2021   Please contact the on call pager after 5 pm and on weekends at [phone].

## 2021-07-05 NOTE — Progress Notes (Addendum)
Physical Therapy Treatment Patient Details Name: David [name] MRN: [id number] DOB: 1975-08-14 Today's Date: 07/05/2021   History of Present Illness Pt adm 5/10 with acute hypoxic respiratory failure due to aspiration PNA and laryngeal obstruction due to stage IV head and neck CA. Pt had trach placed on 5/13. PEG placed PMH - CHF, malnutrition, Heroin use now on methadone.    PT Comments    Pt received supine in air bed, mother present in room, pt agreeable to therapy session with goal of progressing gait tolerance in hallway. Pt making good progress toward goals, needing up to min guard at most for transfer/gait with rollator, supervising PT notified of goal updates needed. Plan to trial stair negotiation next session to simulate curb steps and stairs he may encounter out in community. Pt continues to benefit from PT services to progress toward functional mobility goals.    Recommendations for follow up therapy are one component of a multi-disciplinary discharge planning process, led by the attending physician.  Recommendations may be updated based on patient status, additional functional criteria and insurance authorization.  Follow Up Recommendations  Home health PT (may progress to no PT needs)     Assistance Recommended at Discharge Intermittent Supervision/Assistance  Patient can return home with the following A little help with walking and/or transfers;A little help with bathing/dressing/bathroom;Assist for transportation;Assistance with cooking/housework;Help with stairs or ramp for entrance   Equipment Recommendations  Rollator (4 wheels)    Recommendations for Other Services       Precautions / Restrictions Precautions Precautions: Fall Precaution Comments: trach Restrictions Weight Bearing Restrictions: No     Mobility  Bed Mobility Overal bed mobility: Needs Assistance Bed Mobility: Supine to Sit, Sit to Supine     Supine to sit: Modified independent  (Device/Increase time) Sit to supine: Modified independent (Device/Increase time)        Transfers Overall transfer level: Needs assistance Equipment used: Rollator (4 wheels) Transfers: Sit to/from Stand Sit to Stand: Min guard           General transfer comment: cues for use of brakes    Ambulation/Gait Ambulation/Gait assistance: Min guard, Supervision Gait Distance (Feet): 310 Feet Assistive device: Rollator (4 wheels) Gait Pattern/deviations: Step-through pattern, Decreased stride length, Trunk flexed, Narrow base of support Gait velocity: decr     General Gait Details: fair cadence, min cues for activity pacing as he tends to rush; unable to assess gait speed due to need for intermittent min guard for safety   Stairs Stairs:  (pt defers this session)              Balance Overall balance assessment: Needs assistance Sitting-balance support: Feet supported, Bilateral upper extremity supported Sitting balance-Leahy Scale: Good     Standing balance support: Bilateral upper extremity supported, During functional activity, Reliant on assistive device for balance Standing balance-Leahy Scale: Poor Standing balance comment: pt reliant on rollator                            Cognition Arousal/Alertness: Awake/alert Behavior During Therapy: WFL for tasks assessed/performed Overall Cognitive Status: Within Functional Limits for tasks assessed                                 General Comments: PMSV in place, raspy voice but able to understand pt needs; pt asking for robitussin for his cough/to break up secretions, PTA recommended  he try the moist air through trach and mobility to see if this would help.        Exercises Other Exercises Other Exercises: gait billed as TE for BLE strengthening    General Comments General comments (skin integrity, edema, etc.): SpO2 WFL on RA throughout, pt kept PMSV on during ambulation; noted pt with  secretion-laden cough and suctioning his own trach at end of session. HR WFL      Pertinent Vitals/Pain Pain Assessment Pain Assessment: Faces Faces Pain Scale: Hurts little more Pain Location: generalized Pain Descriptors / Indicators: Grimacing Pain Intervention(s): Monitored during session, Repositioned           PT Goals (current goals can now be found in the care plan section) Acute Rehab PT Goals Patient Stated Goal: not stated PT Goal Formulation: With patient Time For Goal Achievement: 07/10/21 Progress towards PT goals: Progressing toward goals (PT notified of which goals met and which are needing to be updated.)    Frequency    Min 3X/week      PT Plan Current plan remains appropriate       AM-PAC PT "6 Clicks" Mobility   Outcome Measure  Help needed turning from your back to your side while in a flat bed without using bedrails?: None Help needed moving from lying on your back to sitting on the side of a flat bed without using bedrails?: None Help needed moving to and from a bed to a chair (including a wheelchair)?: A Little Help needed standing up from a chair using your arms (e.g., wheelchair or bedside chair)?: A Little Help needed to walk in hospital room?: A Little Help needed climbing 3-5 steps with a railing? : A Lot 6 Click Score: 19    End of Session Equipment Utilized During Treatment: Gait belt Activity Tolerance: Patient tolerated treatment well Patient left: in bed;with call bell/phone within reach (tried to set bed alarm but pt not heavy enough to allow it to stay on even low sensitivity) Nurse Communication: Mobility status;Other (comment) (pt may need further instruction on benefits of moist air through trach mask and mobility to help break up mucus in throat/chest.) PT Visit Diagnosis: Unsteadiness on feet (R26.81);Other abnormalities of gait and mobility (R26.89);Muscle weakness (generalized) (M62.81);Adult, failure to thrive (R62.7)      Time: 2751-7001 PT Time Calculation (min) (ACUTE ONLY): 16 min  Charges:  $Therapeutic Exercise: 8-22 mins                     Jakylan Ron P., PTA Acute Rehabilitation Services Secure Chat Preferred 9a-5:30pm Office: [place] 07/05/2021, 5:27 PM

## 2021-07-05 NOTE — Progress Notes (Signed)
Speech Language Pathology Treatment: Dysphagia;Passy Muir Speaking valve  Patient Details Name: David [name] MRN: [id number] DOB: 1975/03/03 Today's Date: 07/05/2021 Time: 1710-1720 SLP Time Calculation (min) (ACUTE ONLY): 10 min  Assessment / Plan / Recommendation Clinical Impression  Patient seen for skilled SLP session focused on PMV and swallow goals. Patient awake and alert and seemed to be in a pleasant mood. When SLP asked him how things were going he reported that he is still getting thick mucous in trach. He had honey thick liquids and puree (applesauce) on table and SLP commented about this as he had previously been declining/refusing PO's after getting PEG. He ate a few small bites of applesauce while SLP in room and did not exhibit any significant difficulties. He appropriately donned and doffed PMV when speaking or eating and would remove to suction orally and tracheally suction himself (he chooses to use end of tubing rather than yaunkers and uses same tubing in trach and then in mouth). Patient continues to achieve same level of voicing which is very low in intensity, hoarse sounding voice. He only interacted minimally with SLP, responding to all questions but not elaborating. He had his xbox setup in room and started playing a game while SLP talking to him. When SLP tried to engage him in conversation about having played several of the games as well, he would smile and reply "yeah" but no further interaction. SLP will continue to check in with patient once a week to offer therapy and ensure diet is being tolerated and PMV being tolerated.    HPI HPI: 46yo male with past medical history of laryngeal cancer s/p chemo and radiation and heroin abuse presented to ED on 06/06/21 from home with complaints of shortness of breath and not being able to eat for several days. Patient was admitted on 06/06/2021 with acute hypoxic respiratory failure with sepsis secondary to aspiration pneumonia,  elevated D-dimer, AKI. Developed worsening respiratory distress requiring emergent tracheostomy on 5/13. Patient got PEG on 06/20/21.      SLP Plan  Continue with current plan of care      Recommendations for follow up therapy are one component of a multi-disciplinary discharge planning process, led by the attending physician.  Recommendations may be updated based on patient status, additional functional criteria and insurance authorization.    Recommendations  Diet recommendations: Regular;Honey-thick liquid Liquids provided via: Cup Medication Administration: Whole meds with puree Supervision: Patient able to self feed Compensations: Slow rate;Small sips/bites Postural Changes and/or Swallow Maneuvers: Seated upright 90 degrees      Patient may use Passy-Muir Speech Valve: During all therapies with supervision;During all waking hours (remove during sleep) PMSV Supervision: Intermittent MD: Please consider changing trach tube to : Smaller size         Oral Care Recommendations: Oral care QID Follow Up Recommendations: Outpatient SLP Assistance recommended at discharge: Intermittent Supervision/Assistance SLP Visit Diagnosis: Aphonia (R49.1);Dysphagia, unspecified (R13.10) Plan: Continue with current plan of care          Sonia Baller, MA, CCC-SLP Speech Therapy

## 2021-07-05 NOTE — TOC Progression Note (Addendum)
Transition of Care [facility]) - Progression Note    Patient Details  Name: [name] MRN: [id number] Date of Birth: 07-11-1975  Transition of Care [facility]) CM/SW [place], LCSW Phone Number: 07/05/2021, 9:42 AM  Clinical Narrative:    CSW spoke with patient at bedside to complete discussion regarding his discharge plan. Patient confirms he was previously living with his mother prior to hospitalization but states she will not allow him to return. Patient states he is not interested in returning to his mother's home. Patient states he does not want his medical information discussed with his mother. Patient reports he does not have any other friends or family that he can live with. Patient confirms he is currently on Methadone and stated he is not interested in stopping that medication. Patient reports he has received a Section 8 housing voucher but has been unable to find anywhere to accept it. Patient states his income from disability is $914 monthly. Patient states he is hopeful for decannulation eventually. Patient states he does not want to be placed into a SNF but is unable to identify any other safe discharge options. Patient will finish his IV antibiotics tomorrow.   Expected Discharge Plan: [place] Barriers to Discharge: Continued Medical Work up  Expected Discharge Plan and Services Expected Discharge Plan: [place]                                               Social Determinants of Health (SDOH) Interventions    Readmission Risk Interventions     No data to display

## 2021-07-06 DIAGNOSIS — J9601 Acute respiratory failure with hypoxia: Secondary | ICD-10-CM | POA: Diagnosis not present

## 2021-07-06 LAB — GLUCOSE, CAPILLARY
Glucose-Capillary: 133 mg/dL — ABNORMAL HIGH (ref 70–99)
Glucose-Capillary: 185 mg/dL — ABNORMAL HIGH (ref 70–99)
Glucose-Capillary: 73 mg/dL (ref 70–99)
Glucose-Capillary: 79 mg/dL (ref 70–99)
Glucose-Capillary: 83 mg/dL (ref 70–99)
Glucose-Capillary: 89 mg/dL (ref 70–99)

## 2021-07-06 MED ORDER — HYDROXYZINE HCL 25 MG PO TABS
100.0000 mg | ORAL_TABLET | Freq: Four times a day (QID) | ORAL | Status: DC | PRN
  Administered 2021-07-06 – 2021-07-31 (×45): 100 mg via ORAL
  Filled 2021-07-06 (×46): qty 4

## 2021-07-06 MED ORDER — THIAMINE HCL 100 MG PO TABS
100.0000 mg | ORAL_TABLET | Freq: Every day | ORAL | Status: DC
  Administered 2021-07-07 – 2021-07-30 (×24): 100 mg via ORAL
  Filled 2021-07-06 (×25): qty 1

## 2021-07-06 MED ORDER — OXYCODONE HCL 5 MG PO TABS
5.0000 mg | ORAL_TABLET | Freq: Four times a day (QID) | ORAL | Status: DC | PRN
  Administered 2021-07-07 – 2021-07-26 (×49): 5 mg via ORAL
  Filled 2021-07-06 (×50): qty 1

## 2021-07-06 MED ORDER — PANTOPRAZOLE 2 MG/ML SUSPENSION
40.0000 mg | Freq: Every day | ORAL | Status: DC
  Administered 2021-07-07 – 2021-07-30 (×23): 40 mg via ORAL
  Filled 2021-07-06 (×26): qty 20

## 2021-07-06 MED ORDER — ADULT MULTIVITAMIN W/MINERALS CH
1.0000 | ORAL_TABLET | Freq: Every day | ORAL | Status: DC
  Administered 2021-07-07 – 2021-07-30 (×24): 1 via ORAL
  Filled 2021-07-06 (×25): qty 1

## 2021-07-06 MED ORDER — MIDODRINE HCL 5 MG PO TABS
10.0000 mg | ORAL_TABLET | Freq: Three times a day (TID) | ORAL | Status: DC
  Administered 2021-07-07 – 2021-07-29 (×68): 10 mg via ORAL
  Filled 2021-07-06 (×68): qty 2

## 2021-07-06 MED ORDER — GUAIFENESIN 100 MG/5ML PO LIQD
10.0000 mL | Freq: Four times a day (QID) | ORAL | Status: DC | PRN
Start: 2021-07-06 — End: 2021-07-11
  Administered 2021-07-07 – 2021-07-11 (×2): 10 mL via ORAL
  Filled 2021-07-06 (×4): qty 10

## 2021-07-06 MED ORDER — METHADONE HCL 10 MG PO TABS
50.0000 mg | ORAL_TABLET | Freq: Every day | ORAL | Status: DC
  Administered 2021-07-07 – 2021-07-31 (×25): 50 mg via ORAL
  Filled 2021-07-06 (×25): qty 5

## 2021-07-06 MED ORDER — OSMOLITE 1.5 CAL PO LIQD
237.0000 mL | Freq: Four times a day (QID) | ORAL | Status: DC
  Administered 2021-07-06 – 2021-07-10 (×15): 237 mL
  Filled 2021-07-06 (×17): qty 237

## 2021-07-06 MED ORDER — SENNOSIDES-DOCUSATE SODIUM 8.6-50 MG PO TABS
1.0000 | ORAL_TABLET | Freq: Two times a day (BID) | ORAL | Status: DC
  Administered 2021-07-06 – 2021-07-30 (×38): 1 via ORAL
  Filled 2021-07-06 (×46): qty 1

## 2021-07-06 MED ORDER — FOLIC ACID 1 MG PO TABS
1.0000 mg | ORAL_TABLET | Freq: Every day | ORAL | Status: DC
  Administered 2021-07-07 – 2021-07-30 (×24): 1 mg via ORAL
  Filled 2021-07-06 (×25): qty 1

## 2021-07-06 MED ORDER — MELATONIN 3 MG PO TABS
3.0000 mg | ORAL_TABLET | Freq: Every evening | ORAL | Status: DC | PRN
  Administered 2021-07-21 – 2021-07-30 (×4): 3 mg via ORAL
  Filled 2021-07-06 (×5): qty 1

## 2021-07-06 NOTE — Progress Notes (Signed)
HD#30 SUBJECTIVE:  Patient Summary: David [name] is a 46 y.o. 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 Hospital course has been complicated by ICU admission requiring tracheostomy, bacteremia, concern for endocarditis, and persistent hypotension.  Overnight Events: None  Interim History: Patient is resting comfortably in bed this morning. He is worried initially because today is his last day of antibiotics and he does not yet have a safe place to discharge, but is reassured when told we will make sure he has a safe place to discharge to before sending him home. He has had some bloating with increased tube feeds but is tolerating them overall well.  OBJECTIVE:  Vital Signs: Vitals:   07/05/21 1948 07/05/21 1950 07/05/21 2331 07/06/21 0330  BP: 113/86     Pulse: 83 84 84 86  Resp:  '18 18 18  '$ Temp: 98.7 F (37.1 C)     TempSrc: Oral     SpO2: 99%     Weight:      Height:       Supplemental O2:  Trach collar SpO2: 99 % O2 Flow Rate (L/min): 5 L/min FiO2 (%): 28 %  Filed Weights   06/23/21 0500 07/02/21 1230  Weight: 50.8 kg 36.7 kg     Intake/Output Summary (Last 24 hours) at 07/06/2021 0742 Last data filed at 07/06/2021 0300 Gross per 24 hour  Intake 160 ml  Output 725 ml  Net -565 ml   Net IO Since Admission: -12,144.74 mL [07/06/21 0742]  Physical Exam: Constitutional:Chronically ill and cachectic gentleman resting comfortably in no acute distress. Cardio:Regular rate and rhythm. No murmurs, rubs, or gallops appreciated. Pulm:Tolerating trach collar well, no accessory muscle use. Abdomen:Soft, nontender, nondistended. G tube site without increased erythema. QIO:NGEXB all extremities spontaneously. Skin:Warm and dry. Neuro:Alert and oriented x3. No focal deficit noted. Psych:Pleasant mood and affect.  Patient  Lines/Drains/Airways Status     Active Line/Drains/Airways     Name Placement date Placement time Site Days   Peripheral IV 06/07/21 20 G 1.88" Anterior;Proximal;Right Forearm 06/07/21  0807  Forearm  29   Midline Single Lumen 28/41/32 Left Basilic 10 cm 0 cm 44/01/02  7253  Basilic  13   Gastrostomy/Enterostomy Gastrostomy 18 Fr. LUQ 06/20/21  0832  LUQ  16   Incision (Closed) 06/09/21 Neck Other (Comment) 06/09/21  0327  -- 27   Incision (Closed) 06/20/21 Abdomen Other (Comment) 06/20/21  0810  -- 16   Incision - 3 Ports Abdomen 1: Right;Lateral 2: Right;Lateral;Medial 3: Umbilicus 66/44/03  4742  -- 16   Tracheostomy Other (Comment) 6 mm Uncuffed 06/09/21  0300  6 mm  27   Pressure Injury 06/16/21 Sacrum Medial Stage 2 -  Partial thickness loss of dermis presenting as a shallow open injury with a red, pink wound bed without slough. 06/16/21  2000  -- 20             ASSESSMENT/PLAN:  Assessment: Principal Problem:   Acute respiratory failure with hypoxia (HCC) Active Problems:   Laryngeal cancer (HCC)   Goals of care, counseling/discussion   Hypotension   Protein-calorie malnutrition, severe   Drug abuse and dependence ([place])   Community acquired pneumonia due to Pneumococcus [facility])   Anxiety state   Aspiration pneumonia of right lower lobe (HCC)   Supraglottic airway obstruction   Chronic HFrEF (heart failure with reduced ejection fraction) ([place])  Tracheostomy status (HCC)   Bacterial endocarditis   Pressure injury of skin   S/P percutaneous endoscopic gastrostomy (PEG) tube placement (HCC)   Plan: #Acute hypoxic respiratory failure #CAP 2/2 Strep pneumococcus  #Aspiration pneumonia #Supraglottic airway obstruction requiring intubation, now on trach #Tracheostomy dependence Patient continues to do well with trach and speaking valve. No complaints today. -ENT assisting with trach care, appreciate their assistance             -F/u as outpatient at  discharge -Continue routine trach care -Guaifenesin every 6 hours as needed -Continuous pulse oximetry   #Bacterial endocarditis #Streptococcal pneumococcus  bacteremia Today is his final day of IV antibiotics. He is now appropriate for discharge once a safe location has been identified. -TOC consult for help with safe dispo, appreciate their assistance. -Weekly CBC if still here 06/11   #Severe protein-calorie malnutrition #PEG tube Patient is tolerating tube feeds and PO intake well with continued good appetite and no recurrence of nausea and vomiting. He does endorse some bloating but overall has no concerns about increased volume of feeds. -RD consulted, appreciate their assistance -Continue bolus feeds, 1 can QID today. -Encourage PO intake as tolerated -Zofran PRN - be cautious with QT given on chronic methadone   #Chronic hypotension BP stable. -MAP goal >60 -Continue midodrine.   #Opioid use disorder #Chronic methadone use Patient does not want to transition to Suboxone from Methadone as he says that he did not tolerate it well. He is tolerating current regimen well without complaints. -Continue daily methadone   #Anxiety #Insomnia Stable.  -Continue duloxetine -Continue trazodone QHS -Continue hydroxyzine '100mg'$  every 6 hours as needed     Best Practice: Diet: Tube feeds, regular IVF: None VTE: enoxaparin (LOVENOX) injection 30 mg Start: 06/20/21 1000 Code: Full AB: Ceftriaxone 2g IV daily, last day today DISPO: Anticipated discharge pending Home enviroment access/layout.  Signature: Farrel Gordon, D.O.  Internal Medicine Resident, PGY-1 [facility]  Pager: [phone] 7:42 AM, 07/06/2021   Please contact the on call pager after 5 pm and on weekends at [phone].

## 2021-07-06 NOTE — Progress Notes (Addendum)
Pt refusing due meds r/t c/o nausea.  Pt states " I think I had too much of this" and pointed to Osmolite 1.5 on bedside table.  Pt also mentioned that he has gas and an unsettled stomach.  Pt medicated with PRN zofran and verbalizes some relief.  Pt also proposes to eat more PO if it decreases the TF. ss

## 2021-07-06 NOTE — Progress Notes (Signed)
Pt refusing chg bath and trach suction at this time. Educated by resp therapist the importance of the trach collar and need for suction by staff. Pt will not allow staff to suction at this time.

## 2021-07-06 NOTE — Progress Notes (Signed)
Nutrition Follow-up  DOCUMENTATION CODES:   Severe malnutrition in context of chronic illness, Underweight  INTERVENTION:   Tube feeding via PEG: 1 can Osmolite 1.5 - QID  Continue 45 mL ProSource TF - TID 50 mL free water flush before & after each bolus  Provides 1542 kcal, 60 gm protein, and 1124 mL free water daily.  Continue Multivitamin w/ minerals daily per tube Recommend transitioning some medications to be administered via PO to reduce volume and chances of feeling bloated.  NUTRITION DIAGNOSIS:   Severe Malnutrition related to chronic illness (head and neck cancer) as evidenced by severe muscle depletion, severe fat depletion, percent weight loss (23% weight loss within 3 months). - Ongoing  GOAL:   Patient will meet greater than or equal to 90% of their needs - Being met with TF  MONITOR:   PO intake, Labs, Weight trends, TF tolerance  REASON FOR ASSESSMENT:   Consult Assessment of nutrition requirement/status, Enteral/tube feeding initiation and management  ASSESSMENT:   46 yo male admitted with respiratory failure, suspected aspiration PNA. 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  5/12 - Cortrak placed (tip post pyloric) 5/13 - TF started; Trach placed 5/15 - diet advanced to Dysphagia 3, Honey thick liquids 5/22 - attempted placement of PEG in IR (not placed 2/2 anatomy) 5/23 - Cortrak removed 5/24 - PEG placed 5/26 - diet advanced to regular, Honey Thick liquids 5/30 - transitioned to bolus feeds  RD attempted to visit pt x2.   Pt will have reached 4 cans of Osmolite 1.5 today. Per MD notes, currently has been tolerating slow advancement of TF. Noted to have some bloating with TF.  Recommend giving meds at different time of tube feed to prevent too much volume at one time.  If pt PO intake continues to be inadequate would recommend adding  additional 1/2 can bolus to meet full needs.   Reached out to RN, RN reports no sings of intolerance of TF advancements. Reports eating ~15-20% of his meals.   Medications reviewed and include: Folic acid, SSI, MVI, Protonix, Miralax, Senokot, Thiamine Labs reviewed.  Diet Order:   Diet Order             Diet regular Room service appropriate? Yes; Fluid consistency: Honey Thick  Diet effective now                   EDUCATION NEEDS:   No education needs have been identified at this time  Skin:  Skin Assessment: Skin Integrity Issues: Skin Integrity Issues:: Stage II Stage II: Sacrum  Last BM:  6/6  Height:   Ht Readings from Last 1 Encounters:  06/07/21 5' 11"  (1.803 m)   Weight:  Wt Readings from Last 1 Encounters:  05/15/21 43.9 kg   Ideal Body Weight:  78.2 kg  BMI:  Body mass index is 11.28 kg/m.  Estimated Nutritional Needs:  Kcal:  1500-1700 Protein:  65-75 gm Fluid:  1.5-1.7 L   Hermina Barters RD, LDN Clinical Dietitian See [place] for contact information.

## 2021-07-07 DIAGNOSIS — J9601 Acute respiratory failure with hypoxia: Secondary | ICD-10-CM | POA: Diagnosis not present

## 2021-07-07 LAB — GLUCOSE, CAPILLARY
Glucose-Capillary: 107 mg/dL — ABNORMAL HIGH (ref 70–99)
Glucose-Capillary: 112 mg/dL — ABNORMAL HIGH (ref 70–99)
Glucose-Capillary: 96 mg/dL (ref 70–99)
Glucose-Capillary: 72 mg/dL (ref 70–99)
Glucose-Capillary: 124 mg/dL — ABNORMAL HIGH (ref 70–99)

## 2021-07-07 NOTE — Progress Notes (Signed)
Pt is requesting to only take half the feeding due to being nauseous and having some bloating. He is requesting to start back on bottle tube feeds instead of the bolus. He is concerned that the boated he feels constantly is from bolus feeds. Will pass along to day shift nursing to pass along to the day shift team who covers care. Anxiety meds given prn to assist with mood at this time.

## 2021-07-07 NOTE — Progress Notes (Signed)
HD#31 SUBJECTIVE:  Patient Summary: David [name] is a 46 y.o. 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 Hospital course has been complicated by ICU admission requiring tracheostomy, bacteremia, concern for endocarditis, and persistent hypotension.  Overnight Events: Patient had difficulty tolerating goal of 4 cans tube feeding QID due to unpleasant side effects like bloating and nausea and did not complete entirety of these feeds.  Interim History: Patient is just waking up but has no complaints at this time. He is agreeable to tube feed quantity as tolerated.  OBJECTIVE:  Vital Signs: Vitals:   07/06/21 1946 07/06/21 1955 07/06/21 2258 07/07/21 0405  BP: 111/82     Pulse: 87 86 88 86  Resp: '18 18 16 18  '$ Temp: 98.5 F (36.9 C)     TempSrc: Oral     SpO2: 99% 100%    Weight:      Height:       Supplemental O2:  Trach collar SpO2: 100 % O2 Flow Rate (L/min): 5 L/min FiO2 (%): 28 %  Filed Weights   06/23/21 0500 07/02/21 1230  Weight: 50.8 kg 36.7 kg     Intake/Output Summary (Last 24 hours) at 07/07/2021 0748 Last data filed at 07/07/2021 6226 Gross per 24 hour  Intake 956.67 ml  Output 1100 ml  Net -143.33 ml    Net IO Since Admission: -12,288.07 mL [07/07/21 0748]  Physical Exam: Constitutional:Chronically ill and cachectic gentleman sleeping comfortably in no acute distress. Cardio:Regular rate and rhythm. No murmurs, rubs, or gallops appreciated. Pulm:Tolerating trach collar well, no accessory muscle use. Abdomen:Soft, nontender, nondistended. G tube site without increased erythema. JFH:LKTGY all extremities spontaneously. Skin:Warm and dry. Neuro:Alert and oriented x3. No focal deficit noted. Psych:Pleasant mood and affect.  Patient Lines/Drains/Airways Status     Active Line/Drains/Airways     Name Placement date  Placement time Site Days   Peripheral IV 06/07/21 20 G 1.88" Anterior;Proximal;Right Forearm 06/07/21  0807  Forearm  29   Midline Single Lumen 56/38/93 Left Basilic 10 cm 0 cm 73/42/87  6811  Basilic  13   Gastrostomy/Enterostomy Gastrostomy 18 Fr. LUQ 06/20/21  0832  LUQ  16   Incision (Closed) 06/09/21 Neck Other (Comment) 06/09/21  0327  -- 27   Incision (Closed) 06/20/21 Abdomen Other (Comment) 06/20/21  0810  -- 16   Incision - 3 Ports Abdomen 1: Right;Lateral 2: Right;Lateral;Medial 3: Umbilicus 57/26/20  3559  -- 16   Tracheostomy Other (Comment) 6 mm Uncuffed 06/09/21  0300  6 mm  27   Pressure Injury 06/16/21 Sacrum Medial Stage 2 -  Partial thickness loss of dermis presenting as a shallow open injury with a red, pink wound bed without slough. 06/16/21  2000  -- 20             ASSESSMENT/PLAN:  Assessment: Principal Problem:   Acute respiratory failure with hypoxia (HCC) Active Problems:   Laryngeal cancer (HCC)   Goals of care, counseling/discussion   Hypotension   Protein-calorie malnutrition, severe   Drug abuse and dependence ([place])   Community acquired pneumonia due to Pneumococcus [facility])   Anxiety state   Aspiration pneumonia of right lower lobe (HCC)   Supraglottic airway obstruction   Chronic HFrEF (heart failure with reduced ejection fraction) (HCC)   Tracheostomy status (HCC)   Bacterial endocarditis   Pressure injury of skin   S/P percutaneous endoscopic  gastrostomy (PEG) tube placement [facility])   Plan: #Acute hypoxic respiratory failure #CAP 2/2 Strep pneumococcus  #Aspiration pneumonia #Supraglottic airway obstruction requiring intubation, now on trach #Tracheostomy dependence Stable. No complaints today. -ENT assisting with trach care, appreciate their assistance             -F/u as outpatient at discharge -Continue routine trach care -Guaifenesin every 6 hours as needed -Continuous pulse oximetry   #Bacterial endocarditis #Streptococcal  pneumococcus  bacteremia Patient has completed course of IV antibiotics and is appropriate for discharge once safe discharge location is identified. -TOC consult for help with safe dispo, appreciate their assistance. -Weekly CBC if still here 06/11   #Severe protein-calorie malnutrition #PEG tube Patient has had some increased bloating and nausea with attempting 1 full can QID of tube feeds and has declined to finish them due to this. I have counseled him to eat as much as he is able to tolerate without becoming ill-feeling. -RD consulted, appreciate their assistance -Continue bolus feeds, as close to 1 can QID as tolerated -Encourage PO intake as tolerated -Zofran PRN - be cautious with QT given on chronic methadone   #Chronic hypotension BP stable. -MAP goal >60 -Continue midodrine.   #Opioid use disorder #Chronic methadone use Patient does not want to transition to Suboxone from Methadone as he says that he did not tolerate it well. He is tolerating current regimen well without complaints. -Continue daily methadone   #Anxiety #Insomnia Stable.  -Continue duloxetine -Continue trazodone QHS -Continue hydroxyzine '100mg'$  every 6 hours as needed     Best Practice: Diet: Tube feeds, regular IVF: None VTE: enoxaparin (LOVENOX) injection 30 mg Start: 06/20/21 1000 Code: Full AB: Ceftriaxone 2g IV daily, last day today DISPO: Anticipated discharge pending Home enviroment access/layout.  Signature: Farrel Gordon, D.O.  Internal Medicine Resident, PGY-1 [facility]  Pager: [phone] 7:48 AM, 07/07/2021   Please contact the on call pager after 5 pm and on weekends at [phone].

## 2021-07-08 LAB — GLUCOSE, CAPILLARY
Glucose-Capillary: 102 mg/dL — ABNORMAL HIGH (ref 70–99)
Glucose-Capillary: 85 mg/dL (ref 70–99)
Glucose-Capillary: 91 mg/dL (ref 70–99)
Glucose-Capillary: 96 mg/dL (ref 70–99)
Glucose-Capillary: 97 mg/dL (ref 70–99)
Glucose-Capillary: 99 mg/dL (ref 70–99)

## 2021-07-08 LAB — CBC
HCT: 29.8 % — ABNORMAL LOW (ref 39.0–52.0)
Hemoglobin: 9.7 g/dL — ABNORMAL LOW (ref 13.0–17.0)
MCH: 30.5 pg (ref 26.0–34.0)
MCHC: 32.6 g/dL (ref 30.0–36.0)
MCV: 93.7 fL (ref 80.0–100.0)
Platelets: 232 10*3/uL (ref 150–400)
RBC: 3.18 MIL/uL — ABNORMAL LOW (ref 4.22–5.81)
RDW: 16.1 % — ABNORMAL HIGH (ref 11.5–15.5)
WBC: 6.3 10*3/uL (ref 4.0–10.5)
nRBC: 0 % (ref 0.0–0.2)

## 2021-07-08 MED ORDER — IBUPROFEN 200 MG PO TABS
400.0000 mg | ORAL_TABLET | ORAL | Status: AC | PRN
  Administered 2021-07-08 – 2021-07-09 (×3): 400 mg via ORAL
  Filled 2021-07-08 (×3): qty 2

## 2021-07-08 NOTE — Progress Notes (Signed)
NAME:  David [name], MRN:  [id number], DOB:  1975/12/27, LOS: 68 ADMISSION DATE:  06/06/2021  Subjective  Patient evaluated at bedside this AM. Discussed episode of vomiting yesterday with increased tube feeds. Also reports small bruise on hand from sitting up in the bed.  Objective   Blood pressure 97/82, pulse 85, temperature 97.7 F (36.5 C), temperature source Oral, resp. rate 20, height '5\' 11"'$  (1.803 m), weight 36.7 kg, SpO2 97 %.     Intake/Output Summary (Last 24 hours) at 07/08/2021 1013 Last data filed at 07/07/2021 2018 Gross per 24 hour  Intake 537 ml  Output 250 ml  Net 287 ml   Filed Weights   06/23/21 0500 07/02/21 1230  Weight: 50.8 kg 36.7 kg   Physical Exam: General: Thin, chronically ill-appearing, no acute distress CV: Regular rate, rhythm. No murmurs appreciated. Warm extremities. Pulm: Normal work of breathing. Clear to ausculation bilaterally. Abdomen: Soft, non-tender, non-distended. Normoactive bowel sounds. G-tube in place. MSK: Small edematous area over dorsal aspect of R hand with small ecchymoses. Mild tenderness to palpation over dorsal aspect of hand. Passive and active range of motion of R wrist normal. Radial pulse 2+.  Neuro: Awake, alert, conversing appropriately. Grossly non-focal. Sensation and motor function in tact RUE.   Labs       Latest Ref Rng & Units 07/08/2021    3:45 AM 07/01/2021    1:48 AM 06/25/2021    9:54 AM  CBC  WBC 4.0 - 10.5 K/uL 6.3  5.6  4.9   Hemoglobin 13.0 - 17.0 g/dL 9.7  8.4  8.0   Hematocrit 39.0 - 52.0 % 29.8  25.9  25.0   Platelets 150 - 400 K/uL 232  289  217       Latest Ref Rng & Units 07/01/2021    1:48 AM 06/25/2021    9:54 AM 06/17/2021    1:08 AM  BMP  Glucose 70 - 99 mg/dL 101  87  110   BUN 6 - 20 mg/dL '17  13  11   '$ Creatinine 0.61 - 1.24 mg/dL 0.43  0.41  0.46   Sodium 135 - 145 mmol/L 134  131  134   Potassium 3.5 - 5.1 mmol/L 4.3  4.2  4.4   Chloride 98 - 111 mmol/L 98  94  98   CO2 22 -  32 mmol/L 31  32  31   Calcium 8.9 - 10.3 mg/dL 8.8  8.5  8.0     Summary   David [name] is 46yo person with stage IV squamous cell carcinoma of larynx s/p chemoradiation in remission, protein-calorie malnutrition, opioid use disorder on methadone admitted 5/10 for sepsis and acute hypoxic respiratory failure initially requiring intubation and ICU stay, subsequently had tracheostomy. Found to have Strep bacteremia with concern for endocarditis, just finished four week course of IV antibiotics, now medically stable for discharge.  Assessment & Plan:  Principal Problem:   Acute respiratory failure with hypoxia (HCC) Active Problems:   Laryngeal cancer (HCC)   Goals of care, counseling/discussion   Hypotension   Protein-calorie malnutrition, severe   Drug abuse and dependence ([place])   Community acquired pneumonia due to Pneumococcus [facility])   Anxiety state   Aspiration pneumonia of right lower lobe (HCC)   Supraglottic airway obstruction   Chronic HFrEF (heart failure with reduced ejection fraction) ([place])   Tracheostomy status (HCC)   Bacterial endocarditis   Pressure injury of skin   S/P percutaneous endoscopic gastrostomy (PEG) tube  placement [facility])  #Acute respiratory failure s/p tracheostomy  #Stage IV squamous cell carcinoma of larynx No acute changes. Appreciate ENT's assistance with tracheostomy.   - ENT assisting with trach care, will need outpatient follow-up - Routine trach care - Guaifenesin every 6 hours as needed - Continuous pulse oximetry  #Severe protein-caloria malnutrition #PEG tube dependent Had some nausea and bloating yesterday after increasing foods. Might have reached his maximum tolerated feeds. Appreciate RD's assistance. - Bolus feeds per RD, close to 1 can QID as tolerated - PO intake as tolerated - Zofran PRN - monitor QT on methadone  #R hand contusion From minor injury in bed. Unlikely to be broken, do not think this needs imaging. -  Ibuprofen '400mg'$  every 4 hours as needed x2d - Ice packs  #Social determinants of health Patient has difficult situation outside of the hospital. He cannot return to his mother's house and does not have any available SNF offers given OUD. Appreciate CSW's due diligence and assistance in this difficult case.   Best practice:  DIET: Regular IVF: n/a DVT PPX: lovenox BOWEL: Miralax CODE: FULL FAM COM: n/a - patient requests to not give updates to his mother  David Dame, MD Internal Medicine Resident PGY-2 PAGER: [phone] 07/08/2021 10:13 AM  If after hours (below), please contact on-call pager: [phone] 5PM-7AM Monday-Friday 1PM-7AM Saturday-Sunday

## 2021-07-09 DIAGNOSIS — J9601 Acute respiratory failure with hypoxia: Secondary | ICD-10-CM | POA: Diagnosis not present

## 2021-07-09 LAB — GLUCOSE, CAPILLARY
Glucose-Capillary: 89 mg/dL (ref 70–99)
Glucose-Capillary: 90 mg/dL (ref 70–99)
Glucose-Capillary: 92 mg/dL (ref 70–99)

## 2021-07-09 MED ORDER — RIVAROXABAN 10 MG PO TABS
10.0000 mg | ORAL_TABLET | Freq: Every day | ORAL | Status: DC
  Administered 2021-07-09 – 2021-07-30 (×22): 10 mg via ORAL
  Filled 2021-07-09 (×23): qty 1

## 2021-07-09 MED ORDER — PHENOL 1.4 % MT LIQD
1.0000 | OROMUCOSAL | Status: DC | PRN
  Filled 2021-07-09: qty 177

## 2021-07-09 MED ORDER — RIVAROXABAN 10 MG PO TABS: 10.0000 mg | ORAL_TABLET | Freq: Every day | ORAL | Status: DC

## 2021-07-09 NOTE — Progress Notes (Signed)
HD#33 SUBJECTIVE:  Patient Summary: David [name] is a 46 y.o. 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 Hospital course has been complicated by ICU admission requiring tracheostomy, bacteremia, concern for endocarditis, and persistent hypotension. He has completed a 4 week course of IV antibiotics and is stable for discharge once a safe plan has been formed.  Overnight Events: None  Interim History: David [name] expressed several concerns this morning, including his continued weight loss and difficulty tolerating increased amounts of food, struggles to ambulate well, frustration at his current situation, and feeling over what his current predicament is. He feels unheard at times by staff and feels left in the dark about his health. I worry that his outlook is again dismal now that the excitement of completing his antibiotics has passed. He is quite tearful and frustrated, understandably so. I have encouraged him to get out of bed today and accept help getting a bath.  OBJECTIVE:  Vital Signs: Vitals:   07/09/21 0333 07/09/21 0741 07/09/21 0827 07/09/21 1107  BP:  97/67    Pulse: 88 91 95 89  Resp: '20 18 18 18  '$ Temp:  98.4 F (36.9 C)    TempSrc:  Oral    SpO2: 95% 96% 97% 99%  Weight:      Height:       Supplemental O2:  Trach collar SpO2: 99 % O2 Flow Rate (L/min): 5 L/min FiO2 (%): 28 %  Filed Weights   06/23/21 0500 07/02/21 1230  Weight: 50.8 kg 36.7 kg     Intake/Output Summary (Last 24 hours) at 07/09/2021 1119 Last data filed at 07/08/2021 2112 Gross per 24 hour  Intake 350 ml  Output 300 ml  Net 50 ml   Net IO Since Admission: -11,944.07 mL [07/09/21 1119]  Physical Exam: Constitutional:Chronically ill and cachectic, in no acute distress. Cardio:Regular rate and rhythm. No murmurs, rubs, or gallops  appreciated. Pulm:Tolerating trach collar well, no accessory muscle use. Abdomen:Soft, nontender, nondistended. G tube site without increased erythema. UTM:LYYTK all extremities spontaneously. Skin:Warm and dry. Neuro:Alert and oriented x3. No focal deficit noted. Psych:Tearful and frustrated with current situation.  Patient Lines/Drains/Airways Status     Active Line/Drains/Airways     Name Placement date Placement time Site Days   Peripheral IV 06/07/21 20 G 1.88" Anterior;Proximal;Right Forearm 06/07/21  0807  Forearm  32   Midline Single Lumen 35/46/56 Left Basilic 10 cm 0 cm 81/27/51  7001  Basilic  16   Gastrostomy/Enterostomy Gastrostomy 18 Fr. LUQ 06/20/21  0832  LUQ  19   Incision (Closed) 06/09/21 Neck Other (Comment) 06/09/21  0327  -- 30   Incision (Closed) 06/20/21 Abdomen Other (Comment) 06/20/21  0810  -- 19   Incision - 3 Ports Abdomen 1: Right;Lateral 2: Right;Lateral;Medial 3: Umbilicus 74/94/49  6759  -- 19   Tracheostomy Other (Comment) 6 mm Uncuffed 06/09/21  0300  6 mm  30   Pressure Injury 06/16/21 Sacrum Medial Stage 2 -  Partial thickness loss of dermis presenting as a shallow open injury with a red, pink wound bed without slough. 06/16/21  2000  -- 23             ASSESSMENT/PLAN:  Assessment: Principal Problem:   Acute respiratory failure with hypoxia (HCC) Active Problems:   Laryngeal cancer (HCC)   Goals of care, counseling/discussion   Hypotension   Protein-calorie malnutrition,  severe   Drug abuse and dependence ([place])   Community acquired pneumonia due to Pneumococcus [facility])   Anxiety state   Aspiration pneumonia of right lower lobe (HCC)   Supraglottic airway obstruction   Chronic HFrEF (heart failure with reduced ejection fraction) ([place])   Tracheostomy status (HCC)   Bacterial endocarditis   Pressure injury of skin   S/P percutaneous endoscopic gastrostomy (PEG) tube placement (HCC)   Plan: #Acute respiratory failure s/p tracheostomy   #Stage IV squamous cell carcinoma of larynx No acute changes. Appreciate ENT's assistance with tracheostomy.   - ENT assisting with trach care, will need outpatient follow-up - Routine trach care - Guaifenesin every 6 hours as needed - Continuous pulse oximetry   #Severe protein-caloria malnutrition #PEG tube dependent Patient is struggling between increasing bolus of his feeds and unpleasant symptoms of bloating and nausea. He is very worried about continued weight loss and weakness and becomes upset when explaining how he is trying to eat as much as possible to gain weight and strength but is struggling. He has a fear of choking as well, which is a new concern brought to my attention. I encouraged him to eat as much as he can tolerate without feeling poorly and to try and get out of bed to walk with staff. -RD consulted, appreciate their assistance -Continue bolus feeds, as close to 1 can QID as tolerated -Encourage PO intake as tolerated -Zofran PRN - be cautious with QT given on chronic methadone   #R hand contusion Patient is upset as he requested ibuprofen for discomfort and it was not on his medication list. The area does not have increased erythema or edema but he does report tenderness to palpation. He uses the hand without hesitation.  -Ibuprofen '400mg'$  every 4 hours as needed x2d -Ice packs   #Social determinants of health Patient has difficult situation outside of the hospital. He cannot return to his mother's house and does not have any available SNF offers given OUD. Appreciate CSW's due diligence and assistance in this difficult case.   #Chronic hypotension BP stable. -MAP goal >60 -Continue midodrine.   #Opioid use disorder #Chronic methadone use -Continue daily methadone   #Anxiety #Insomnia -Continue duloxetine -Continue trazodone QHS -Continue hydroxyzine '100mg'$  every 6 hours as needed  Best Practice: Diet: Regular diet and tube feeds IVF: None VTE:  rivaroxaban (XARELTO) tablet 10 mg Start: 07/09/21 0929 Code: Full AB: None DISPO: Anticipated discharge pending Home enviroment access/layout.  Signature: Farrel Gordon, D.O.  Internal Medicine Resident, PGY-1 [facility]  Pager: [phone] 11:19 AM, 07/09/2021   Please contact the on call pager after 5 pm and on weekends at [phone].

## 2021-07-09 NOTE — Progress Notes (Signed)
Physical Therapy Treatment Patient Details Name: David [name] MRN: [id number] DOB: 04/18/1975 Today's Date: 07/09/2021   History of Present Illness Pt adm 5/10 with acute hypoxic respiratory failure due to aspiration PNA and laryngeal obstruction due to stage IV head and neck CA. Pt had trach placed on 5/13. PEG placed PMH - CHF, malnutrition, Heroin use now on methadone.    PT Comments    Pt received in supine, initially agreeable to therapy session, plan for progressing OOB mobility/stairs. Pt c/o feeling frustration due to limited progress, therapist emphasized benefits of OOB mobility and gradual progression of mobility within tolerance, pt tearful and deferring OOB to chair, gait or stair training after extensive discussion. Pt given HEP handout and instructional handout on energy conservation, plan to trial stairs vs progress standing tolerance next session if pt agreeable. Pt may benefit from chaplain consult due to emotional lability and stress regarding uncertain dispo if he is agreeable, RN notified.   Recommendations for follow up therapy are one component of a multi-disciplinary discharge planning process, led by the attending physician.  Recommendations may be updated based on patient status, additional functional criteria and insurance authorization.  Follow Up Recommendations  Home health PT     Assistance Recommended at Discharge Intermittent Supervision/Assistance  Patient can return home with the following A little help with walking and/or transfers;A little help with bathing/dressing/bathroom;Assist for transportation;Assistance with cooking/housework;Help with stairs or ramp for entrance   Equipment Recommendations  Rollator (4 wheels)    Recommendations for Other Services       Precautions / Restrictions Precautions Precautions: Fall Precaution Comments: trach, g-tube Restrictions Weight Bearing Restrictions: No     Mobility  Bed Mobility Overal bed  mobility: Needs Assistance             General bed mobility comments: pt refusing despite max encouragement    Transfers                        Ambulation/Gait                   Stairs Stairs:  (encouraged pt to attempt ascending step at bedside or gait, he refuses, c/o malaise)             Balance       Sitting balance - Comments: pt refusing EOB/OOB                                    Cognition Arousal/Alertness: Awake/alert Behavior During Therapy: WFL for tasks assessed/performed Overall Cognitive Status: Within Functional Limits for tasks assessed                                 General Comments: PMSV in place, raspy voice but able to understand pt needs; pt tearful during session, speaking about his frustration with unclear DC plan and wanting to find a place to live. Attempted to make connection between increased mobility and if pt improves his independence that he may have more options for living spaces. Encouraged him to consider speaking with chaplain, pt reports he is agnostic and doesn't want to feel judged.        Exercises Other Exercises Other Exercises: HEP: [email or web address] Access Code: [email or web address] Other Exercises: pt tearful, deferring to perform during session; also discussion on activity pacing and slow progression of mobility.  General Comments  SpO2 WFL on RA, PMSV donned throughout      Pertinent Vitals/Pain Pain Assessment Pain Assessment: Faces Faces Pain Scale: Hurts little more Pain Location: generalized Pain Descriptors / Indicators: Grimacing, Crying Pain Intervention(s): Monitored during session, Limited activity within patient's tolerance (pt refusing repositioning for comfort despite instruction on benefits)           PT Goals (current goals can now be found in the care plan section) Acute Rehab PT Goals Patient Stated Goal: To feel better, to find a place to live. PT  Goal Formulation: With patient Time For Goal Achievement: 07/10/21 Progress towards PT goals: Not progressing toward goals - comment (self-limiting due to stress/anxiety this session)    Frequency    Min 3X/week      PT Plan Current plan remains appropriate       AM-PAC PT "6 Clicks" Mobility   Outcome Measure  Help needed turning from your back to your side while in a flat bed without using bedrails?: None Help needed moving from lying on your back to sitting on the side of a flat bed without using bedrails?: None Help needed moving to and from a bed to a chair (including a wheelchair)?: A Little (anticipated; pt refusing) Help needed standing up from a chair using your arms (e.g., wheelchair or bedside chair)?: A Little (anticipated based on RN report/previous session) Help needed to walk in hospital room?: A Little (anticipated based on RN report/previous session) Help needed climbing 3-5 steps with a railing? : Total 6 Click Score: 18    End of Session   Activity Tolerance: Other (comment) (pt refusing OOB due to anxiety/feeling of malaise) Patient left: in bed;with call bell/phone within reach;with nursing/sitter in room (RN entering room) Nurse Communication: Mobility status;Other (comment) (pt might benefit from chaplain consult or therapist for emotional support; pt anxious/tearful throughout and refusing OOB) PT Visit Diagnosis: Unsteadiness on feet (R26.81);Other abnormalities of gait and mobility (R26.89);Muscle weakness (generalized) (M62.81);Adult, failure to thrive (R62.7)     Time: 3299-2426 PT Time Calculation (min) (ACUTE ONLY): 10 min  Charges:  $Therapeutic Activity: 8-22 mins                     Kodi Steil P., PTA Acute Rehabilitation Services Secure Chat Preferred 9a-5:30pm Office: [place] 07/09/2021, 3:55 PM

## 2021-07-09 NOTE — TOC Progression Note (Signed)
Transition of Care [facility]) - Progression Note    Patient Details  Name: David [name] MRN: [id number] Date of Birth: 12-08-1975  Transition of [facility]) CM/SW [place], RN Phone Number: 07/09/2021, 1:38 PM  Clinical Narrative:    CM met with the patient at the beside to discuss transitions of care with the patient - and patient's progress to find housing.  The patient states that he is being assisted by Jackelyn Poling, Administrator with [facility] [phone] to find housing but does not have availability for an apartment at this time.  I called and spoke with Epimenio Sarin, [place] with [facility] and she states that she is actively looking for an apartment for the patient at this time but does not have a definitive place at the time due to patient's past history with substance abuse and other social past history.  Mel Almond states that she will reach me by phone if she needs assistance in communication at a later date if a safe apartment is established.  The patient states that he was previous living with his mother at a single family home in [place] and refuses to return home to live with her and does not want medical information shared with her at this time.  The patient states that the mother was not providing adequate care for him in the home and does not want her involved in making decisions for his care.  The patient states that he was previously homeless in [place] area and has been living in a tent for the past 2 years before staying in his mother's home.  The patient states that he has been approved for a housing voucher in [facility], receives food stamps for 284.00 per month, and also receives $914.00 per month from social security.  The patient states he plans to use Medicaid transportation when he is discharged from the hospital.  CM spoke with the patient regarding follow up for Methadone and the patient plans to continue care through [facility]  in [place].  I will complete Personal Care assistance documentation through [facility] and fax documentation to be provided through [facility].  These services will provide home health aide services in the home through [place].  CM and MSW with DTP Team will continue to follow the patient for discharge planning once safe housing can be established through the assistance of [place].  DME - Rollator will need to be ordered and provided to the patient prior to discharge.  Expected Discharge Plan: [place] Barriers to Discharge: Homeless with medical needs  Expected Discharge Plan and Services Expected Discharge Plan: [place] In-house Referral: Clinical Social Work Discharge Planning Services: CM Consult Post Acute Care Choice: [place] arrangements for the past 2 months: Single Family Home                                       Social Determinants of Health (SDOH) Interventions    Readmission Risk Interventions     No data to display

## 2021-07-10 DIAGNOSIS — J9601 Acute respiratory failure with hypoxia: Secondary | ICD-10-CM | POA: Diagnosis not present

## 2021-07-10 MED ORDER — FREE WATER
125.0000 mL | Freq: Four times a day (QID) | Status: DC
  Administered 2021-07-10 – 2021-08-22 (×167): 125 mL

## 2021-07-10 MED ORDER — OSMOLITE 1.5 CAL PO LIQD
1000.0000 mL | ORAL | Status: DC
  Administered 2021-07-10 – 2021-07-16 (×7): 1000 mL
  Filled 2021-07-10 (×9): qty 1000

## 2021-07-10 NOTE — Plan of Care (Signed)
  Problem: Coping: Goal: Level of anxiety will decrease Outcome: Progressing   Problem: Pain Managment: Goal: General experience of comfort will improve Outcome: Progressing   Problem: Safety: Goal: Ability to remain free from injury will improve Outcome: Progressing   Problem: Skin Integrity: Goal: Risk for impaired skin integrity will decrease Outcome: Progressing   

## 2021-07-10 NOTE — Progress Notes (Signed)
Nutrition Follow-up  DOCUMENTATION CODES:   Severe malnutrition in context of chronic illness, Underweight  INTERVENTION:   Tube Feeds via PEG: Transition to nocturnal feeds of Osmolite 1.5 @ 80 mL/hr x 12 hours (960 mL/day) 45 mL ProSource TF - TID 125 mL free water flush q6h Provides 1560 kcal, 93 gm protein, and 1232 mL free water (TF + flush) daily. Continue MVI daily Recommend obtaining daily weights  NUTRITION DIAGNOSIS:   Severe Malnutrition related to chronic illness (head and neck cancer) as evidenced by severe muscle depletion, severe fat depletion, percent weight loss (23% weight loss within 3 months). - Ongoing, being addressed via TF  GOAL:   Patient will meet greater than or equal to 90% of their needs - Being addressed via TF and PO   MONITOR:   PO intake, Labs, Weight trends, TF tolerance  REASON FOR ASSESSMENT:   Consult Assessment of nutrition requirement/status, Enteral/tube feeding initiation and management  ASSESSMENT:   46 yo male admitted with respiratory failure, suspected aspiration PNA. 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  5/12 - Cortrak placed (tip post pyloric) 5/13 - TF started; Trach placed 5/15 - diet advanced to Dysphagia 3, Honey thick liquids 5/22 - attempted placement of PEG in IR (not placed 2/2 anatomy) 5/23 - Cortrak removed 5/24 - PEG placed 5/26 - diet advanced to regular, Honey Thick liquids 5/30 - transitioned to bolus feeds 6/13 - transitioned to nocturnal feeds  RD working remotely today.   Pt continues to not tolerate full goal of bolus feeds. Discussed with MD that the next option would be to transition to nocturnal feeds and allow him to eat throughout the day.  RD to follow-up with to ensure pt is tolerating current regimen.  Per EMR, pt PO intake remains between 5-25% of meals.   Pt with no new weight in 1  week; daily weights already ordered. Discussed with MD.   Medications reviewed and include: Folic acid, MVI, Protonix, Miralax, Senokot, Thiamine Labs reviewed.   Diet Order:   Diet Order             Diet regular Room service appropriate? Yes; Fluid consistency: Honey Thick  Diet effective now                  EDUCATION NEEDS:   No education needs have been identified at this time  Skin:  Skin Assessment: Skin Integrity Issues: Per MD note, no signs of pressure injury on sacrum/back  Last BM:  6/11 -Type 4  Height:  Ht Readings from Last 1 Encounters:  06/07/21 '5\' 11"'$  (1.803 m)   Weight:  Wt Readings from Last 1 Encounters:  05/15/21 43.9 kg   Ideal Body Weight:  78.2 kg  BMI:  Body mass index is 11.28 kg/m.  Estimated Nutritional Needs:  Kcal:  1500-1700 Protein:  65-75 gm Fluid:  1.5-1.7 L   Hermina Barters RD, LDN Clinical Dietitian See [facility] for contact information.

## 2021-07-10 NOTE — Progress Notes (Signed)
HD#34 SUBJECTIVE:  Patient Summary: David [name] is a 46 y.o. 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 [name] course has been complicated by ICU admission requiring tracheostomy, bacteremia, concern for endocarditis, and persistent hypotension. He has completed a 4 week course of IV antibiotics and is stable for discharge once a safe plan has been formed.  Overnight Events: None  Interim History: Patient seems to be in better spirits this morning with questions regarding our discussion yesterday about his tracheostomy causing him to feel like his throat is irritated and further cancer follow-up that he fears he has missed scans for.   OBJECTIVE:  Vital Signs: Vitals:   07/09/21 1528 07/09/21 2039 07/10/21 0058 07/10/21 0423  BP:  106/82    Pulse: 89 90 96 86  Resp: '19 20 18 16  '$ Temp:  98.2 F (36.8 C)    TempSrc:  Oral    SpO2: 98% 94% 95%   Weight:      Height:       Supplemental O2:  Trach collar SpO2: 95 % O2 Flow Rate (L/min): 5 L/min FiO2 (%): 28 %  Filed Weights   06/23/21 0500 07/02/21 1230  Weight: 50.8 kg 36.7 kg     Intake/Output Summary (Last 24 hours) at 07/10/2021 0720 Last data filed at 07/10/2021 0344 Gross per 24 hour  Intake 240 ml  Output 1100 ml  Net -860 ml    Net IO Since Admission: -12,804.07 mL [07/10/21 0720]  Physical Exam: Constitutional:Chronically ill and cachectic, in no acute distress. Cardio:Regular rate and rhythm. No murmurs, rubs, or gallops appreciated. Pulm:Tolerating trach collar well, no accessory muscle use. Abdomen:Soft, nontender, nondistended. WGN:FAOZH all extremities spontaneously. Skin:Warm and dry. No evidence of pressure ulcers over back or sacrum. Neuro:Alert and oriented x3. No focal deficit noted. Psych:Pleasant mood and affect.  Patient Lines/Drains/Airways Status      Active Line/Drains/Airways     Name Placement date Placement time Site Days   Peripheral IV 06/07/21 20 G 1.88" Anterior;Proximal;Right Forearm 06/07/21  0807  Forearm  32   Midline Single Lumen 08/65/78 Left Basilic 10 cm 0 cm 46/96/29  5284  Basilic  16   Gastrostomy/Enterostomy Gastrostomy 18 Fr. LUQ 06/20/21  0832  LUQ  19   Incision (Closed) 06/09/21 Neck Other (Comment) 06/09/21  0327  -- 30   Incision (Closed) 06/20/21 Abdomen Other (Comment) 06/20/21  0810  -- 19   Incision - 3 Ports Abdomen 1: Right;Lateral 2: Right;Lateral;Medial 3: Umbilicus 13/24/40  1027  -- 19   Tracheostomy Other (Comment) 6 mm Uncuffed 06/09/21  0300  6 mm  30   Pressure Injury 06/16/21 Sacrum Medial Stage 2 -  Partial thickness loss of dermis presenting as a shallow open injury with a red, pink wound bed without slough. 06/16/21  2000  -- 23             ASSESSMENT/PLAN:  Assessment: Principal Problem:   Acute respiratory failure with hypoxia (David [name]) Active Problems:   Laryngeal cancer (David [name])   Goals of [name], counseling/discussion   Hypotension   Protein-calorie malnutrition, severe   Drug abuse and dependence (David [name])   Community acquired pneumonia due to Pneumococcus David [name])   Anxiety state   Aspiration pneumonia of right lower lobe (David [name])   Supraglottic airway obstruction   Chronic HFrEF (heart failure with reduced ejection fraction) (David [name])   Tracheostomy status (David [name])  Bacterial endocarditis   Pressure injury of skin   S/P percutaneous endoscopic gastrostomy (PEG) tube placement David [name])   Plan: #Acute respiratory failure s/p tracheostomy  #Stage IV squamous cell carcinoma of larynx Patient is concerned that he is missing routine follow-up for his cancer while being hospitalized and has had new complaint of pain and irritation in the back of his throat from what he feels like is the trach equipment irritating the area. I ordered chloraseptic spray after he raised concerns that his throat felt  painful/irritated and he has this at bedside for use as needed. -ENT will follow-up after discharge for continued trach management and monitoring for his history of stage IV SCC of larynx. -Will reach out to RT to see if they can come by to assess given his throat pain/irritation complaints -Continue chloraseptic spray as needed -Routine trach [name] -Guaifenesin every 6 hours as needed   #Severe protein-caloria malnutrition #PEG tube dependent Due to continued difficulty tolerating increased tube feed quantity, and in shared decision making with RD and the patient, we will change tube feeds to night feeds. He was enjoying breakfast during our exam today without difficulty.  -RD consulted, appreciate their assistance -Transition to night tube feeding -Encourage PO intake as tolerated -Zofran PRN - be cautious with QT given on chronic methadone -Daily weights   #Social determinants of health Patient has difficult situation outside of the [name] which is very distressing to him. He cannot return to his mother's house and does not have any available SNF offers given OUD. Appreciate CSW's due diligence and assistance in this difficult case.   #Chronic hypotension BP stable. -MAP goal >60 -Continue midodrine.   #Opioid use disorder #Chronic methadone use -Continue daily methadone   #Anxiety #Insomnia Patient has had increased anxiety this week, particularly regarding his dispo planning and next steps for his health. -Continue duloxetine -Continue trazodone QHS -Continue hydroxyzine '100mg'$  every 6 hours as needed  Best Practice: Diet: Regular diet and tube feeds IVF: None VTE: rivaroxaban (XARELTO) tablet 10 mg Start: 07/09/21 0929 Code: Full AB: None DISPO: Anticipated discharge pending Home enviroment access/layout.  Signature: Farrel Gordon, D.O.  Internal Medicine Resident, PGY-1 [facility]  Pager: [phone] 7:20 AM, 07/10/2021   Please  contact the on call pager after 5 pm and on weekends at [phone].

## 2021-07-11 ENCOUNTER — Inpatient Hospital Stay (HOSPITAL_COMMUNITY): Payer: Medicaid Other

## 2021-07-11 DIAGNOSIS — Z923 Personal history of irradiation: Secondary | ICD-10-CM | POA: Insufficient documentation

## 2021-07-11 DIAGNOSIS — K117 Disturbances of salivary secretion: Secondary | ICD-10-CM | POA: Insufficient documentation

## 2021-07-11 DIAGNOSIS — J9601 Acute respiratory failure with hypoxia: Secondary | ICD-10-CM | POA: Diagnosis not present

## 2021-07-11 DIAGNOSIS — R432 Parageusia: Secondary | ICD-10-CM | POA: Insufficient documentation

## 2021-07-11 MED ORDER — SCOPOLAMINE 1 MG/3DAYS TD PT72
1.0000 | MEDICATED_PATCH | Freq: Once | TRANSDERMAL | Status: DC
Start: 2021-07-11 — End: 2021-07-11

## 2021-07-11 MED ORDER — DICLOFENAC SODIUM 1 % EX GEL
2.0000 g | Freq: Four times a day (QID) | CUTANEOUS | Status: DC | PRN
  Administered 2021-07-11 – 2021-07-27 (×15): 2 g via TOPICAL
  Filled 2021-07-11 (×2): qty 100

## 2021-07-11 MED ORDER — GUAIFENESIN 100 MG/5ML PO LIQD
15.0000 mL | Freq: Four times a day (QID) | ORAL | Status: DC | PRN
Start: 2021-07-11 — End: 2021-07-26
  Administered 2021-07-12 – 2021-07-26 (×26): 15 mL via ORAL
  Filled 2021-07-11 (×28): qty 15

## 2021-07-11 NOTE — Progress Notes (Signed)
Physical Therapy Treatment Patient Details Name: David [name] MRN: [id number] DOB: [birth year] Today's Date: 07/11/2021   History of Present Illness Hermilo Dutter is a 46 y.o. male admitted for sepsis and acute hypoxic respiratory failure. Hospital course has been complicated by ICU admission requiring tracheostomy, bacteremia, concern for endocarditis, and persistent hypotension. He has completed a 4 week course of IV antibiotics and is stable for discharge once a safe plan has been formed. 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    PT Comments    Pt received in supine, agreeable to therapy session with encouragement. Pt needing up to Supervision for safety cues for transfers and gait using rollator, pt gait tolerance limited to short distance in room to/from bathroom<>bed due to dypsnea on exertion (SpO2 desat to 91% with exertion), fatigue and pt need for self-suctioning. Recommend to assess orthostatic vitals next session as pt mildly dizzy, pt refusing to have them checked after return to supine. SpO2 WFL after pt suctioned/returned to supine. Pt continues to benefit from PT services to progress toward functional mobility goals. Pt would benefit from working with RN and mobility specialist team to progress gait/standing tolerance and encourage more frequent OOB mobility.  Recommendations for follow up therapy are one component of a multi-disciplinary discharge planning process, led by the attending physician.  Recommendations may be updated based on patient status, additional functional criteria and insurance authorization.  Follow Up Recommendations  Home health PT     Assistance Recommended at Discharge Intermittent Supervision/Assistance  Patient can return home with the following A little help with walking and/or transfers;A little help with bathing/dressing/bathroom;Assist for transportation;Assistance with  cooking/housework;Help with stairs or ramp for entrance   Equipment Recommendations  Rollator (4 wheels)    Recommendations for Other Services       Precautions / Restrictions Precautions Precautions: Fall Precaution Comments: trach, g-tube Restrictions Weight Bearing Restrictions: No     Mobility  Bed Mobility Overal bed mobility: Modified Independent Bed Mobility: Supine to Sit, Sit to Supine, Rolling Rolling: Independent   Supine to sit: Modified independent (Device/Increase time) Sit to supine: Modified independent (Device/Increase time)   General bed mobility comments: pt using bed rails    Transfers Overall transfer level: Needs assistance Equipment used: Rollator (4 wheels) Transfers: Sit to/from Stand Sit to Stand: Supervision           General transfer comment: cues for use of brakes; from elevated air bed surface and to BSC height over toilet in bathroom    Ambulation/Gait Ambulation/Gait assistance: Supervision Gait Distance (Feet): 20 Feet (x2 to/from bathroom) Assistive device: Rollator (4 wheels) Gait Pattern/deviations: Step-through pattern, Decreased stride length, Trunk flexed, Narrow base of support       General Gait Details: distance limited due to pt c/o DOE and needing to suction, pt also reports lightheadedness; SpO2 91% after pt suctioned his trach, then 93% once back in bed; HR to 107 bpm with exertion; forward head/rounded shoulders posture   Stairs             Wheelchair Mobility    Modified Rankin (Stroke Patients Only)       Balance Overall balance assessment: Needs assistance Sitting-balance support: Feet supported, Bilateral upper extremity supported Sitting balance-Leahy Scale: Good     Standing balance support: Bilateral upper extremity supported, During functional activity Standing balance-Leahy Scale: Poor Standing balance comment: pt reliant on rollator  Cognition  Arousal/Alertness: Awake/alert Behavior During Therapy: WFL for tasks assessed/performed Overall Cognitive Status: Within Functional Limits for tasks assessed                                 General Comments: PMSV in place, raspy voice but able to understand pt needs; cooperative with encouragement.        Exercises Other Exercises Other Exercises: HEP: [email or web address] Access Code: [email or web address] Other Exercises: pt refusing to perform and reports he has not done them since packet brought to room on Monday.    General Comments General comments (skin integrity, edema, etc.): SpO2 91-93% on RA with exertion (improved after pt self-suctioned his trach); HR 71 bpm resting and up to 107 bpm with exertion; pt defers BP post-exertion, needs orthostatic assessment next session      Pertinent Vitals/Pain Pain Assessment Pain Assessment: Faces Faces Pain Scale: Hurts little more Pain Location: generalized Pain Descriptors / Indicators: Grimacing, Discomfort Pain Intervention(s): Monitored during session, Repositioned, Limited activity within patient's tolerance (lidocaine patch on lower back)    Home Living                          Prior Function            PT Goals (current goals can now be found in the care plan section) Acute Rehab PT Goals Patient Stated Goal: To feel better, to find a place to live. PT Goal Formulation: With patient Time For Goal Achievement: 07/20/21 Progress towards PT goals: Progressing toward goals    Frequency    Min 3X/week      PT Plan Current plan remains appropriate    Co-evaluation              AM-PAC PT "6 Clicks" Mobility   Outcome Measure  Help needed turning from your back to your side while in a flat bed without using bedrails?: None Help needed moving from lying on your back to sitting on the side of a flat bed without using bedrails?: None Help needed moving to and from a bed to a chair (including  a wheelchair)?: A Little Help needed standing up from a chair using your arms (e.g., wheelchair or bedside chair)?: A Little Help needed to walk in hospital room?: A Little Help needed climbing 3-5 steps with a railing? : Total 6 Click Score: 18    End of Session Equipment Utilized During Treatment: Gait belt Activity Tolerance: Other (comment);Treatment limited secondary to medical complications (Comment);Patient limited by fatigue (dyspnea on exertion, pt needing to suction trach after return from bathroom, mild dizziness/fatigue) Patient left: in bed;with call bell/phone within reach;with nursing/sitter in room (RN in room to assist with clean-up of bed/linens; pt refusing to sit in chair) Nurse Communication: Mobility status;Other (comment) (he would benefit from staff assist to/from bathroom and up to sink for bathing, needs to get OOB more.) PT Visit Diagnosis: Unsteadiness on feet (R26.81);Other abnormalities of gait and mobility (R26.89);Muscle weakness (generalized) (M62.81);Adult, failure to thrive (R62.7)     Time: 4128-7867 PT Time Calculation (min) (ACUTE ONLY): 15 min  Charges:  $Therapeutic Activity: 8-22 mins                     Danajah Birdsell P., PTA Acute Rehabilitation Services Secure Chat Preferred 9a-5:30pm Office: [place] 07/11/2021, 5:30 PM

## 2021-07-11 NOTE — Progress Notes (Addendum)
HD#35 SUBJECTIVE:  Patient Summary: David [name] is a 46 y.o. 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 Hospital course has been complicated by ICU admission requiring tracheostomy, bacteremia, concern for endocarditis, and persistent hypotension. He has completed a 4 week course of IV antibiotics and is stable for discharge once a safe plan has been formed.  Overnight Events: None  Interim History: Patient tolerated overnight feeds well with no complaints. His R hand continues to bother him and the swelling persists.  OBJECTIVE:  Vital Signs: Vitals:   07/10/21 2006 07/10/21 2324 07/11/21 0346 07/11/21 0722  BP: 101/79     Pulse: 91 85 95 88  Resp: '16 20 20 18  '$ Temp: 98.1 F (36.7 C)     TempSrc:      SpO2: 99% 95% 96% 97%  Weight:      Height:       Supplemental O2:  Trach collar SpO2: 97 % O2 Flow Rate (L/min): 5 L/min FiO2 (%): 21 %  Filed Weights   06/23/21 0500 07/02/21 1230  Weight: 50.8 kg 36.7 kg     Intake/Output Summary (Last 24 hours) at 07/11/2021 0728 Last data filed at 07/10/2021 1531 Gross per 24 hour  Intake 120 ml  Output 400 ml  Net -280 ml   Net IO Since Admission: -13,084.07 mL [07/11/21 0728]  Physical Exam: Constitutional:Chronically ill and cachectic, in no acute distress. Cardio:Regular rate and rhythm. No murmurs, rubs, or gallops appreciated. Pulm:Tolerating trach collar well, no accessory muscle use. Abdomen:Soft, nontender, nondistended. ZHY:QMVHQ all extremities spontaneously. R hand dorsal surface with persistent tenderness to palpation and mild edema. Skin:Warm and dry. Neuro:Alert and oriented x3. No focal deficit noted. Psych:Pleasant mood and affect.  Patient Lines/Drains/Airways Status     Active Line/Drains/Airways     Name Placement date Placement time Site Days   Peripheral IV  06/07/21 20 G 1.88" Anterior;Proximal;Right Forearm 06/07/21  0807  Forearm  34   Midline Single Lumen 46/96/29 Left Basilic 10 cm 0 cm 52/84/13  2440  Basilic  18   Gastrostomy/Enterostomy Gastrostomy 18 Fr. LUQ 06/20/21  0832  LUQ  21   Incision (Closed) 06/09/21 Neck Other (Comment) 06/09/21  0327  -- 32   Incision (Closed) 06/20/21 Abdomen Other (Comment) 06/20/21  0810  -- 21   Incision - 3 Ports Abdomen 1: Right;Lateral 2: Right;Lateral;Medial 3: Umbilicus 11/24/23  3664  -- 21   Tracheostomy Other (Comment) 6 mm Uncuffed 06/09/21  0300  6 mm  32   Pressure Injury 06/16/21 Sacrum Medial Stage 2 -  Partial thickness loss of dermis presenting as a shallow open injury with a red, pink wound bed without slough. 06/16/21  2000  -- 25             ASSESSMENT/PLAN:  Assessment: Principal Problem:   Acute respiratory failure with hypoxia (HCC) Active Problems:   Laryngeal cancer (HCC)   Goals of care, counseling/discussion   Hypotension   Protein-calorie malnutrition, severe   Drug abuse and dependence ([place])   Community acquired pneumonia due to Pneumococcus [facility])   Anxiety state   Aspiration pneumonia of right lower lobe (HCC)   Supraglottic airway obstruction   Chronic HFrEF (heart failure with reduced ejection fraction) ([place])   Tracheostomy status (HCC)   Bacterial endocarditis   Pressure injury of skin   S/P percutaneous endoscopic gastrostomy (PEG) tube placement ([place])  Plan: #Acute respiratory failure s/p tracheostomy  #Stage IV squamous cell carcinoma of larynx No complaints regarding throat pain or discomfort today. Respiratory team made aware of throat pain/irritation possibly from tracheostomy equipment 06/13. -ENT will follow-up after discharge for continued trach management and monitoring for his history of stage IV SCC of larynx. -Continue chloraseptic spray as needed -Routine trach care -Guaifenesin every 6 hours as needed  #R hand pain Has been present for  several days now and started after an attempt to sit himself upright in bed. No erythema or hematoma noted but the dorsal aspect of the R hand is tender to palpation with some mild edema. -R hand x-ray   #Severe protein-caloria malnutrition #PEG tube dependent Patient had first night of overnight trickle tube feeds last night and handled it well without complication. He is agreeable to continuing with this method of tube feeds.  -RD consulted, appreciate their assistance -Transition to night tube feeding -Encourage PO intake as tolerated -Zofran PRN - be cautious with QT given on chronic methadone -Daily weights   #Social determinants of health Patient has difficult situation outside of the hospital which is very distressing to him. He cannot return to his mother's house and does not have any available SNF offers given OUD. Appreciate CSW's due diligence and assistance in this difficult case.    #Chronic hypotension BP stable. -MAP goal >60 -Continue midodrine.   #Opioid use disorder #Chronic methadone use -Continue daily methadone   #Anxiety #Insomnia No signs or verbal endorsement of increased anxiety this morning. Stable. -Continue duloxetine -Continue trazodone QHS -Continue hydroxyzine '100mg'$  every 6 hours as needed   Best Practice: Diet: Regular diet and tube feeds (nighttime trickle) IVF: None VTE: rivaroxaban (XARELTO) tablet 10 mg Start: 07/09/21 0929 Code: Full AB: None DISPO: Anticipated discharge pending Home enviroment access/layout.  Signature: Farrel Gordon, D.O.  Internal Medicine Resident, PGY-1 [facility]  Pager: [phone] 7:28 AM, 07/11/2021   Please contact the on call pager after 5 pm and on weekends at [phone].

## 2021-07-12 DIAGNOSIS — J9601 Acute respiratory failure with hypoxia: Secondary | ICD-10-CM | POA: Diagnosis not present

## 2021-07-12 MED ORDER — IBUPROFEN 200 MG PO TABS
400.0000 mg | ORAL_TABLET | ORAL | Status: DC | PRN
Start: 2021-07-12 — End: 2021-07-17
  Administered 2021-07-13 – 2021-07-17 (×2): 400 mg via ORAL
  Filled 2021-07-12 (×2): qty 2

## 2021-07-12 NOTE — Progress Notes (Signed)
Pt with complaints of increased mucus production with trach at shift change. Pt allowed tracheal suctioning x1 before refusal. Internal Medicine MD notified of pt's change in status. PRN robitussin dose increased to 40m. PRN meds given for anxiety and phlegm. Pt with no complaints at this time, care ongoing.

## 2021-07-12 NOTE — Progress Notes (Signed)
Physical Therapy Treatment Patient Details Name: David [name] MRN: [id number] DOB: 1975-09-08 Today's Date: 07/12/2021   History of Present Illness Arron Tetrault is a 46 y.o. male admitted for sepsis and acute hypoxic respiratory failure. Hospital course has been complicated by ICU admission requiring tracheostomy, bacteremia, concern for endocarditis, and persistent hypotension. He has completed a 4 week course of IV antibiotics and is stable for discharge once a safe plan has been formed. 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    PT Comments    Pt was seen for a bed level visit due to fatigue and declining to walk.  However did put in a good effort until he began to get more clearance from trach after moving.  He was concerned about this and did talk with him about the effect of moving on clearing secretions.  Pt is handling the trach often, including the inner cannula.  Follow the pt for mobility and strengthening as tolerated.   Recommendations for follow up therapy are one component of a multi-disciplinary discharge planning process, led by the attending physician.  Recommendations may be updated based on patient status, additional functional criteria and insurance authorization.  Follow Up Recommendations  Home health PT     Assistance Recommended at Discharge Intermittent Supervision/Assistance  Patient can return home with the following A little help with walking and/or transfers;A little help with bathing/dressing/bathroom;Assist for transportation;Assistance with cooking/housework;Help with stairs or ramp for entrance   Equipment Recommendations  Rollator (4 wheels)    Recommendations for Other Services       Precautions / Restrictions Precautions Precautions: Fall Precaution Comments: trach, g-tube Restrictions Weight Bearing Restrictions: No     Mobility  Bed Mobility               General  bed mobility comments: self repositioning    Transfers                   General transfer comment: declines    Ambulation/Gait                   Stairs             Wheelchair Mobility    Modified Rankin (Stroke Patients Only)       Balance                                            Cognition Arousal/Alertness: Awake/alert Behavior During Therapy: WFL for tasks assessed/performed Overall Cognitive Status: Within Functional Limits for tasks assessed                                 General Comments: using PMV to communicate        Exercises General Exercises - Lower Extremity Ankle Circles/Pumps: AROM, Both, 5 reps, Supine Heel Slides: AROM, 10 reps Hip ABduction/ADduction: PROM, AROM, 10 reps Straight Leg Raises: AROM, 10 reps    General Comments        Pertinent Vitals/Pain Pain Assessment Pain Assessment: Faces Faces Pain Scale: Hurts a little bit Pain Location: generalized Pain Descriptors / Indicators: Discomfort Pain Intervention(s): Monitored during session, Repositioned    Home Living  Prior Function            PT Goals (current goals can now be found in the care plan section) Progress towards PT goals: Not progressing toward goals - comment    Frequency    Min 3X/week      PT Plan Current plan remains appropriate    Co-evaluation              AM-PAC PT "6 Clicks" Mobility   Outcome Measure  Help needed turning from your back to your side while in a flat bed without using bedrails?: None Help needed moving from lying on your back to sitting on the side of a flat bed without using bedrails?: None Help needed moving to and from a bed to a chair (including a wheelchair)?: A Little Help needed standing up from a chair using your arms (e.g., wheelchair or bedside chair)?: A Little Help needed to walk in hospital room?: A Little Help needed  climbing 3-5 steps with a railing? : A Lot 6 Click Score: 19    End of Session Equipment Utilized During Treatment: Oxygen Activity Tolerance: Patient limited by fatigue Patient left: in bed;with call bell/phone within reach;with bed alarm set Nurse Communication: Mobility status PT Visit Diagnosis: Unsteadiness on feet (R26.81);Other abnormalities of gait and mobility (R26.89);Muscle weakness (generalized) (M62.81);Adult, failure to thrive (R62.7)     Time: 6803-2122 PT Time Calculation (min) (ACUTE ONLY): 28 min  Charges:  $Therapeutic Exercise: 23-37 mins    Ramond Dial 07/12/2021, 4:19 PM  Mee Hives, PT PhD Acute Rehab Dept. Number: [place] [facility] [place]

## 2021-07-12 NOTE — Progress Notes (Signed)
HD#36 SUBJECTIVE:  Patient Summary: David [name] is a 46 y.o. 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 Hospital course has been complicated by ICU admission requiring tracheostomy, bacteremia, concern for endocarditis, and persistent hypotension. He has completed a 4 week course of IV antibiotics and is stable for discharge once a safe plan has been formed.  Overnight Events: Patient requested ibuprofen for hand pain overnight as well as scopolamine patch for increased secretions. Night team ordered Voltaren gel as well as increased his robitussin PRN dose.  Interim History: Patient endorses continued tenderness at R hand site where there is mild edema. He says that the Voltaren gel provides relief but only for a short period of time. He states that he has still been trying to find housing this week.  OBJECTIVE:  Vital Signs: Vitals:   07/11/21 2304 07/12/21 0357 07/12/21 0746 07/12/21 0906  BP:  (!) 117/96  96/75  Pulse: 80 79 94 95  Resp: '16 18 18 18  '$ Temp:    98.7 F (37.1 C)  TempSrc:    Oral  SpO2: 97% 98% 98% 97%  Weight:      Height:       Supplemental O2:  Trach collar SpO2: 97 % O2 Flow Rate (L/min): 5 L/min FiO2 (%): 28 %  Filed Weights   06/23/21 0500 07/02/21 1230  Weight: 50.8 kg 36.7 kg     Intake/Output Summary (Last 24 hours) at 07/12/2021 1005 Last data filed at 07/12/2021 2094 Gross per 24 hour  Intake 955 ml  Output 750 ml  Net 205 ml    Net IO Since Admission: -12,879.07 mL [07/12/21 1005]  Physical Exam: Constitutional:Chronically ill and cachectic, in no acute distress. Cardio:Regular rate and rhythm. No murmurs, rubs, or gallops appreciated. Pulm:Tolerating trach collar well, no accessory muscle use. Abdomen:Soft, nontender, nondistended. BSJ:GGEZM all extremities spontaneously. R hand dorsal surface with  persistent tenderness to palpation and mild edema. Skin:Warm and dry. Neuro:Alert and oriented x3. No focal deficit noted. Psych:Pleasant mood and affect.  Patient Lines/Drains/Airways Status     Active Line/Drains/Airways     Name Placement date Placement time Site Days   Peripheral IV 06/07/21 20 G 1.88" Anterior;Proximal;Right Forearm 06/07/21  0807  Forearm  34   Midline Single Lumen 62/94/76 Left Basilic 10 cm 0 cm 54/65/03  5465  Basilic  18   Gastrostomy/Enterostomy Gastrostomy 18 Fr. LUQ 06/20/21  0832  LUQ  21   Incision (Closed) 06/09/21 Neck Other (Comment) 06/09/21  0327  -- 32   Incision (Closed) 06/20/21 Abdomen Other (Comment) 06/20/21  0810  -- 21   Incision - 3 Ports Abdomen 1: Right;Lateral 2: Right;Lateral;Medial 3: Umbilicus 68/12/75  1700  -- 21   Tracheostomy Other (Comment) 6 mm Uncuffed 06/09/21  0300  6 mm  32   Pressure Injury 06/16/21 Sacrum Medial Stage 2 -  Partial thickness loss of dermis presenting as a shallow open injury with a red, pink wound bed without slough. 06/16/21  2000  -- 25             ASSESSMENT/PLAN:  Assessment: Principal Problem:   Acute respiratory failure with hypoxia (HCC) Active Problems:   Laryngeal cancer (HCC)   Goals of care, counseling/discussion   Hypotension   Protein-calorie malnutrition, severe   Drug abuse and dependence ([place])   Community acquired pneumonia due to Pneumococcus ([place])  Anxiety state   Aspiration pneumonia of right lower lobe (HCC)   Supraglottic airway obstruction   Chronic HFrEF (heart failure with reduced ejection fraction) (HCC)   Tracheostomy status (HCC)   Bacterial endocarditis   Pressure injury of skin   S/P percutaneous endoscopic gastrostomy (PEG) tube placement (HCC)   Plan: #Acute respiratory failure s/p tracheostomy  #Stage IV squamous cell carcinoma of larynx Tolerating well without complaints. He has had subjectively worsened secretions and requested Scopolamine patch for  this. Night team increased Robitussin PRN dose to assist in secretion management. -ENT will follow-up after discharge for continued trach management and monitoring for his history of stage IV SCC of larynx. -Continue chloraseptic spray as needed -Routine trach care -Guaifenesin every 6 hours as needed  #R hand pain Patient continues to endorse pain in the area, only alleviated somewhat by Voltaren gel. He is requesting ibuprofen by mouth for this pain. R hand x-ray 06/14 did not show any fracture.  -Will order ibuprofen 400 mg q4h PRN   #Severe protein-caloria malnutrition #PEG tube dependent Patient had first night of overnight trickle tube feeds last night and handled it well without complication. He is agreeable to continuing with this method of tube feeds.  -RD consulted, appreciate their assistance -Continue night tube feeding -Encourage PO intake as tolerated -Zofran PRN - be cautious with QT given on chronic methadone -Daily weights   #Social determinants of health Patient has difficult situation outside of the hospital which is very distressing to him. He cannot return to his mother's house and does not have any available SNF offers given OUD. Appreciate CSW's due diligence and assistance in this difficult case.    #Chronic hypotension BP stable. -MAP goal >60 -Continue midodrine.   #Opioid use disorder #Chronic methadone use -Continue daily methadone   #Anxiety #Insomnia No signs or verbal endorsement of increased anxiety this morning. Stable. -Continue duloxetine -Continue trazodone QHS -Continue hydroxyzine '100mg'$  every 6 hours as needed   Best Practice: Diet: Regular diet and tube feeds (nighttime trickle) IVF: None VTE: rivaroxaban (XARELTO) tablet 10 mg Start: 07/09/21 0929 Code: Full AB: None DISPO: Anticipated discharge pending Home enviroment access/layout.  Signature: Farrel Gordon, D.O.  Internal Medicine Resident, PGY-1 [facility]  Pager: [phone] 10:05 AM, 07/12/2021   Please contact the on call pager after 5 pm and on weekends at [phone].

## 2021-07-13 DIAGNOSIS — J9601 Acute respiratory failure with hypoxia: Secondary | ICD-10-CM | POA: Diagnosis not present

## 2021-07-13 MED ORDER — KETOROLAC TROMETHAMINE 15 MG/ML IJ SOLN
7.5000 mg | Freq: Once | INTRAMUSCULAR | Status: AC
  Administered 2021-07-13: 7.5 mg via INTRAVENOUS
  Filled 2021-07-13: qty 1

## 2021-07-13 NOTE — TOC Progression Note (Signed)
Transition of Care [facility]) - Progression Note    Patient Details  Name: Riad Wagley MRN: [id number] Date of Birth: 01-Aug-1975  Transition of Care [facility]) CM/SW Contact  Curlene Labrum, RN Phone Number: 07/13/2021, 2:51 PM  Clinical Narrative:    CM met with the patient and mother at the bedside to discuss transitions of care needs.  I called and spoke with Epimenio Sarin, [facility] CM and she states that she has a possible apartment for the patient that may be available in the next few weeks.  Mel Almond states that she is following up with an apartment complex on Wednesday, 07/18/2021 for a possible available room for the patient.  I updated the patient and asked that his mother at least be involved to assist with delivery of necessary dme to an available apartment at a later date.  The patient was agreeable.  I explained to the patient that a responsible family/friend would be need for delivery of supplies - including trach supplies, suction supplies, PEG supplies and any dme equipment.  I also encouraged the patient to work with PT/OT/RN staff to continue strengthening so that he is able to discharge safely to an apartment at a later date.  The patient verabalized understanding.  CM and MSW with DTP Team will continue to follow the patient for discharge planning needs.   Expected Discharge Plan: [place] Barriers to Discharge: Homeless with medical needs  Expected Discharge Plan and Services Expected Discharge Plan: [place] In-house Referral: Clinical Social Work Discharge Planning Services: CM Consult Post Acute Care Choice: [place] arrangements for the past 2 months: Single Family Home                                       Social Determinants of Health (SDOH) Interventions    Readmission Risk Interventions     No data to display

## 2021-07-13 NOTE — Progress Notes (Addendum)
This RN covering lunch break for pt's primary RN. Pt called RN to room c/o his trach touching the back of his throat. RN assessed trach, secured with trach ties. Pt keeps pulling trach out so it's not touching his skin. RN educated pt about the importance of not pulling it out too much or loosening the trach ties too much. Pt stated he has been complaining about this since this am. Informed pt that RT will be paged.   RN paged [place]well Miners, RT. She stated since ENT placed trach pt is a difficult airway so she will send message to ENT MD. Pt informed. Awaiting response from ENT.   [phone] RN paged ENT MD, [name] [name], awaiting call back.

## 2021-07-13 NOTE — Progress Notes (Signed)
RT tried to get a hold of ENT concerning pt complaining of pain from trach. RT called the office and was given Dr. Janace Hoard number to call. Dr. Janace Hoard said he was not on call for today.  RT then called Dr. Benjamine Mola which was shown for on call. Dr. Deeann Saint secretary says he is not on call for today.  CCM was consulted to look at trach due to not being able to get in contact with ENT.

## 2021-07-13 NOTE — Progress Notes (Signed)
   Subjective:  HD37  Patient evaluated at bedside this AM. No acute changes, doing well this morning. Tolerating tube feeds without difficulty.   Objective:  Vital signs in last 24 hours: Vitals:   07/12/21 2346 07/13/21 0344 07/13/21 0743 07/13/21 0943  BP:    91/71  Pulse: 92 89 82 79  Resp: '18 18 16 16  '$ Temp:    98.2 F (36.8 C)  TempSrc:    Oral  SpO2: 97% 98% 97% 99%  Weight:      Height:       General: Frail, chronically-ill appearing in no acute distress CV: Regular rate, rhythm. No murmurs appreciated. Pulm: Normal respiratory effort on room air. Clear to ausculation bilaterally. Abd: Soft, non-tender, non-distended. Normoactive bowel sounds. Skin: Warm, dry. G-tube site non-erythematous, no leakage appreciated. Neuro: Awake, alert, conversing appropriately. Grossly non-focal.  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   Principal Problem:   Acute respiratory failure with hypoxia (HCC) Active Problems:   Laryngeal cancer (HCC)   Goals of care, counseling/discussion   Hypotension   Protein-calorie malnutrition, severe   Drug abuse and dependence ([place])   Community acquired pneumonia due to Pneumococcus [facility])   Anxiety state   Aspiration pneumonia of right lower lobe (HCC)   Supraglottic airway obstruction   Chronic HFrEF (heart failure with reduced ejection fraction) (HCC)   Tracheostomy status (HCC)   Bacterial endocarditis   Pressure injury of skin   S/P percutaneous endoscopic gastrostomy (PEG) tube placement ([place])  #Tracheostomy dependent #Stage IV squamous cell carcinoma  of larynx No acute changes over last 24h, secretions have been stable. Will continue with current management. - Will need ENT outpatient follow-up - Guaifenesin every 6 hours as needed - Routine trach care  #Severe protein-calorie malnutrition #PEG-tube dependent Tube feeds going well, no reported adverse effects yesterday. Will continue with his current regimen. - Appreciate RD assistance with night tube feeding - PO intake as tolerated - Daily weights - Zofran PRN - if needs multiple doses would get ECG  #Opioid use disorder Stable. Continue daily methadone  #Social determinants of health Patient is now on difficult to place team, appreciate their assistance. We will continue to look for stable housing. Appreciate case manager and social work's assistance with this difficult case.  Diet: Regular with nighttime trickle feeds IVF: none VTE: Xarelto Code: FULL FAM: Patient requests to not discuss his medical care with his mother  Prior to Admission Living Arrangement: home Anticipated Discharge Location: pending Barriers to Discharge: placement Dispo: Anticipated discharge in approximately >2 day(s).   Sanjuan Dame, MD 07/13/2021, 10:16 AM Pager: [phone] After 5pm on weekdays and 1pm on weekends: On Call pager [phone]

## 2021-07-13 NOTE — Progress Notes (Signed)
Discussed with RT: needs trach changed, it's been a month.  Patient agreeable.    Op note reviewed: nonchallenging anatomy.  Will replace and add meplex as there is some signs of irritation around stoma.  Have asked primary to reach out to ENT Monday to determine long term plan if any.  Erskine Emery MD PCCM

## 2021-07-13 NOTE — Plan of Care (Signed)

## 2021-07-13 NOTE — Progress Notes (Signed)
Pt refuses to get up at this time for daily weight...agrees to do the weight later this am. All needs addressed this am

## 2021-07-13 NOTE — Progress Notes (Signed)
Patient seen today by trach team for consult.  No education is needed at this time.  All necessary equipment is at beside.   Will continue to follow for progression.  

## 2021-07-14 DIAGNOSIS — J9601 Acute respiratory failure with hypoxia: Secondary | ICD-10-CM | POA: Diagnosis not present

## 2021-07-14 NOTE — Progress Notes (Signed)
Pt refusing daily weight and chg bath at this time.David [name]

## 2021-07-14 NOTE — Progress Notes (Signed)
HD#38 SUBJECTIVE:  Patient Summary: David [name] is a 46 y.o. 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 Hospital course has been complicated by ICU admission requiring tracheostomy, bacteremia, concern for endocarditis, and persistent hypotension. He has completed a 4 week course of IV antibiotics and is stable for discharge once a safe plan has been formed.  Overnight Events: Patient requested ibuprofen for hand pain overnight as well as scopolamine patch for increased secretions. Night team ordered Voltaren gel as well as increased his robitussin PRN dose.  Interim History: Patient has some residual irritation in his throat from his trach, which was changed out yesterday. He has some pain from the way his head falls when he sleeps but is otherwise well. He is encouraged when I inform him that he is up about 2lbs. He seems excited when updating me on a possible apartment home being found for him with further information to come next week. He is still worried about making sure necessary follow-up is in place for cancer monitoring. He is thankful when I inform him that we will make sure the order for his daily weights and bath is later in the morning once he is more awake.  OBJECTIVE:  Vital Signs: Vitals:   07/13/21 1940 07/13/21 1959 07/13/21 2322 07/14/21 0417  BP: 112/88     Pulse: 98 100 84 94  Resp: '18 18 18 18  '$ Temp: 98.3 F (36.8 C)     TempSrc: Oral     SpO2:  97% 96% 96%  Weight:      Height:       Supplemental O2:  Trach collar SpO2: 96 % O2 Flow Rate (L/min): 5 L/min FiO2 (%): 28 %  Filed Weights   07/12/21 1153 07/13/21 1500  Weight: 37.4 kg 37.5 kg     Intake/Output Summary (Last 24 hours) at 07/14/2021 0841 Last data filed at 07/14/2021 0600 Gross per 24 hour  Intake 740 ml  Output 650 ml  Net 90 ml    Net IO Since  Admission: 1,310.6 mL [07/14/21 0841]  Physical Exam: Constitutional:Chronically ill and cachectic, in no acute distress. Cardio:Regular rate and rhythm. Pulm:Tolerating trach collar well, no accessory muscle use. Abdomen:Soft, nontender, nondistended. ION:GEXBM all extremities spontaneously.  Skin:Warm and dry. Neuro:Alert and oriented x3. No focal deficit noted. Psych:Pleasant mood and affect.  Patient Lines/Drains/Airways Status     Active Line/Drains/Airways     Name Placement date Placement time Site Days   Peripheral IV 06/07/21 20 G 1.88" Anterior;Proximal;Right Forearm 06/07/21  0807  Forearm  34   Midline Single Lumen 84/13/24 Left Basilic 10 cm 0 cm 40/10/27  2536  Basilic  18   Gastrostomy/Enterostomy Gastrostomy 18 Fr. LUQ 06/20/21  0832  LUQ  21   Incision (Closed) 06/09/21 Neck Other (Comment) 06/09/21  0327  -- 32   Incision (Closed) 06/20/21 Abdomen Other (Comment) 06/20/21  0810  -- 21   Incision - 3 Ports Abdomen 1: Right;Lateral 2: Right;Lateral;Medial 3: Umbilicus 64/40/34  7425  -- 21   Tracheostomy Other (Comment) 6 mm Uncuffed 06/09/21  0300  6 mm  32   Pressure Injury 06/16/21 Sacrum Medial Stage 2 -  Partial thickness loss of dermis presenting as a shallow open injury with a red, pink wound bed without slough. 06/16/21  2000  -- 25  ASSESSMENT/PLAN:  Assessment: Principal Problem:   Acute respiratory failure with hypoxia (HCC) Active Problems:   Laryngeal cancer (HCC)   Goals of care, counseling/discussion   Hypotension   Protein-calorie malnutrition, severe   Drug abuse and dependence ([place])   Community acquired pneumonia due to Pneumococcus [facility])   Anxiety state   Aspiration pneumonia of right lower lobe (HCC)   Supraglottic airway obstruction   Chronic HFrEF (heart failure with reduced ejection fraction) (HCC)   Tracheostomy status (HCC)   Bacterial endocarditis   Pressure injury of skin   S/P percutaneous endoscopic gastrostomy  (PEG) tube placement ([place])   Plan: #Tracheostomy dependent #Stage IV squamous cell carcinoma of larynx Trach exchanged yesterday by PCCM. Patient did require Toradol for pain but has been stable since. -F/u ENT Monday for further management plans -Continue chloraseptic spray as needed -Routine trach care -Guaifenesin every 6 hours as needed   #Severe protein-caloria malnutrition #PEG tube dependent No acute changes. Patient is tolerating night feeds well. -RD consulted, appreciate their assistance -Continue night tube feeding -Encourage PO intake as tolerated -Zofran PRN - be cautious with QT given on chronic methadone -Daily weights   #Social determinants of health SW update 06/16 that he may have an apartment home in the next few weeks and their contact will be going to see the home on Wednesday. Further updates to come as available.   #Chronic hypotension BP stable. -MAP goal >60 -Continue midodrine.   #Opioid use disorder #Chronic methadone use -Continue daily methadone   #Anxiety #Insomnia Stable. -Continue duloxetine -Continue trazodone QHS -Continue hydroxyzine '100mg'$  every 6 hours as needed   Best Practice: Diet: Regular diet and tube feeds (nighttime trickle) IVF: None VTE: rivaroxaban (XARELTO) tablet 10 mg Start: 07/09/21 0929 Code: Full AB: None DISPO: Anticipated discharge pending Home enviroment access/layout.  Signature: Farrel Gordon, D.O.  Internal Medicine Resident, PGY-1 [facility]  Pager: [phone] 8:41 AM, 07/14/2021   Please contact the on call pager after 5 pm and on weekends at [phone].

## 2021-07-15 DIAGNOSIS — J9601 Acute respiratory failure with hypoxia: Secondary | ICD-10-CM | POA: Diagnosis not present

## 2021-07-15 LAB — CBC
HCT: 29.1 % — ABNORMAL LOW (ref 39.0–52.0)
Hemoglobin: 9.6 g/dL — ABNORMAL LOW (ref 13.0–17.0)
MCH: 30.8 pg (ref 26.0–34.0)
MCHC: 33 g/dL (ref 30.0–36.0)
MCV: 93.3 fL (ref 80.0–100.0)
Platelets: 224 10*3/uL (ref 150–400)
RBC: 3.12 MIL/uL — ABNORMAL LOW (ref 4.22–5.81)
RDW: 15.9 % — ABNORMAL HIGH (ref 11.5–15.5)
WBC: 5.8 10*3/uL (ref 4.0–10.5)
nRBC: 0 % (ref 0.0–0.2)

## 2021-07-15 NOTE — Progress Notes (Signed)
Pt refuses daily weight and chg bath at this time.Marland Kitchen agrees to do both later this am. All needs addressed

## 2021-07-15 NOTE — Progress Notes (Signed)
HD#39 SUBJECTIVE:  Patient Summary: David [name] is a 46 y.o. 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 Hospital course has been complicated by ICU admission requiring tracheostomy, bacteremia, concern for endocarditis, and persistent hypotension. He has completed a 4 week course of IV antibiotics and is stable for discharge once a safe plan has been formed.  Overnight Events: None  Interim History: Patient has continued pain at trach site and on dorsal aspect of R hand but otherwise has no concerns this morning.  OBJECTIVE:  Vital Signs: Vitals:   07/14/21 1932 07/14/21 1947 07/15/21 0030 07/15/21 0345  BP: 113/87     Pulse: 90 96 86 93  Resp: '18 18 18 18  '$ Temp: 98.3 F (36.8 C)     TempSrc: Oral     SpO2:  99% 96% 96%  Weight:      Height:       Supplemental O2:  Trach collar SpO2: 96 % O2 Flow Rate (L/min): 5 L/min FiO2 (%): 28 %  Filed Weights   07/12/21 1153 07/13/21 1500 07/14/21 1200  Weight: 37.4 kg 37.5 kg 37.4 kg     Intake/Output Summary (Last 24 hours) at 07/15/2021 0734 Last data filed at 07/15/2021 0500 Gross per 24 hour  Intake 5367 ml  Output 1200 ml  Net 4167 ml    Net IO Since Admission: 5,477.6 mL [07/15/21 0734]  Physical Exam: Constitutional:Chronically ill and cachectic, in no acute distress. Cardio:Regular rate and rhythm. Pulm:Tolerating trach collar well, no accessory muscle use. Abdomen:Soft, nontender, nondistended. MGQ:QPYPP all extremities spontaneously.  Skin:Warm and dry. Neuro:Alert and oriented x3. No focal deficit noted. Psych:Pleasant mood and affect.  Patient Lines/Drains/Airways Status     Active Line/Drains/Airways     Name Placement date Placement time Site Days   Peripheral IV 06/07/21 20 G 1.88" Anterior;Proximal;Right Forearm 06/07/21  0807  Forearm  34   Midline Single Lumen  50/93/26 Left Basilic 10 cm 0 cm 71/24/58  0998  Basilic  18   Gastrostomy/Enterostomy Gastrostomy 18 Fr. LUQ 06/20/21  0832  LUQ  21   Incision (Closed) 06/09/21 Neck Other (Comment) 06/09/21  0327  -- 32   Incision (Closed) 06/20/21 Abdomen Other (Comment) 06/20/21  0810  -- 21   Incision - 3 Ports Abdomen 1: Right;Lateral 2: Right;Lateral;Medial 3: Umbilicus 33/82/50  5397  -- 21   Tracheostomy Other (Comment) 6 mm Uncuffed 06/09/21  0300  6 mm  32   Pressure Injury 06/16/21 Sacrum Medial Stage 2 -  Partial thickness loss of dermis presenting as a shallow open injury with a red, pink wound bed without slough. 06/16/21  2000  -- 25             ASSESSMENT/PLAN:  Assessment: Principal Problem:   Acute respiratory failure with hypoxia (HCC) Active Problems:   Laryngeal cancer (HCC)   Goals of care, counseling/discussion   Hypotension   Protein-calorie malnutrition, severe   Drug abuse and dependence ([place])   Community acquired pneumonia due to Pneumococcus [facility])   Anxiety state   Aspiration pneumonia of right lower lobe (HCC)   Supraglottic airway obstruction   Chronic HFrEF (heart failure with reduced ejection fraction) (HCC)   Tracheostomy status (HCC)   Bacterial endocarditis   Pressure injury of skin   S/P percutaneous endoscopic gastrostomy (PEG) tube placement ([place])   Plan: #Tracheostomy dependent #Stage IV squamous cell carcinoma  of larynx Stable. Persistent discomfort at trach site that is still unchanged. Patient has PRN pain regimen for moderate-severe pain. -F/u ENT tomorrow 06/19 for further management recommendations -Continue chloraseptic spray as needed -Routine trach care -Guaifenesin every 6 hours as needed   #Severe protein-caloria malnutrition #PEG tube dependent No acute changes. Weight is stable. -RD consulted, appreciate their assistance -Continue night tube feeding -Encourage PO intake as tolerated -Zofran PRN - be cautious with QT given on  chronic methadone -Daily weights   #Social determinants of health SW update 06/16 that he may have an apartment home in the next few weeks and their contact will be going to see the home on Wednesday. Further updates to come as available.   #Chronic hypotension BP stable. -MAP goal >60 -Continue midodrine.   #Opioid use disorder #Chronic methadone use -Continue daily methadone   #Anxiety #Insomnia Stable. -Continue duloxetine -Continue trazodone QHS -Continue hydroxyzine '100mg'$  every 6 hours as needed   Best Practice: Diet: Regular diet and tube feeds (nighttime trickle) IVF: None VTE: rivaroxaban (XARELTO) tablet 10 mg Start: 07/09/21 0929 Code: Full AB: None DISPO: Anticipated discharge pending Home enviroment access/layout.  Signature: Farrel Gordon, D.O.  Internal Medicine Resident, PGY-1 [facility]  Pager: [phone] 7:34 AM, 07/15/2021   Please contact the on call pager after 5 pm and on weekends at [phone].

## 2021-07-16 NOTE — Progress Notes (Signed)
PT Cancellation Note  Patient Details Name: Damareon Lanni MRN: [id number] DOB: 11/09/1975   Cancelled Treatment:    Reason Eval/Treat Not Completed: Patient declined, no reason specified (reports he needs to rest.)   Nazim Kadlec Eli Hose 07/16/2021, 3:36 PM Angi Goodell R. , PTA Acute Rehabilitation Services

## 2021-07-16 NOTE — Progress Notes (Addendum)
HD#40 SUBJECTIVE:  Patient Summary: David [name] is a 46 y.o. 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 Hospital course has been complicated by ICU admission requiring tracheostomy, bacteremia, concern for endocarditis, and persistent hypotension. He has completed a 4 week course of IV antibiotics and is stable for discharge once a safe plan has been formed.  Overnight Events: None  Interim History: Patient has continued pain discomfort from trach, like his throat feels tight. He has not had trouble with swallowing or managing secretions, and his breathing has not been affected.  OBJECTIVE:  Vital Signs: Vitals:   07/15/21 2330 07/16/21 0436 07/16/21 0734 07/16/21 0837  BP:   92/66   Pulse: 82 88 91 92  Resp: '18 18  18  '$ Temp:   99.1 F (37.3 C)   TempSrc:   Oral   SpO2: 97% 97% 97% 95%  Weight:      Height:       Supplemental O2:  Trach collar SpO2: 95 % O2 Flow Rate (L/min): 5 L/min FiO2 (%): 28 %  Filed Weights   07/12/21 1153 07/13/21 1500 07/14/21 1200  Weight: 37.4 kg 37.5 kg 37.4 kg     Intake/Output Summary (Last 24 hours) at 07/16/2021 1135 Last data filed at 07/16/2021 0700 Gross per 24 hour  Intake 1751.67 ml  Output 820 ml  Net 931.67 ml    Net IO Since Admission: 6,109.27 mL [07/16/21 1135]  Physical Exam: Constitutional:Chronically ill and cachectic, in no acute distress. Cardio:Regular rate and rhythm. Pulm:Normal work of breathing on trach collar. Abdomen:Soft, nontender, nondistended. OEV:OJJKK all extremities spontaneously.  Skin:Warm and dry. Neuro:Alert and oriented x3. No focal deficit noted. Psych:Pleasant mood and affect.  Patient Lines/Drains/Airways Status     Active Line/Drains/Airways     Name Placement date Placement time Site Days   Peripheral IV 06/07/21 20 G 1.88" Anterior;Proximal;Right  Forearm 06/07/21  0807  Forearm  34   Midline Single Lumen 93/81/82 Left Basilic 10 cm 0 cm 99/37/16  9678  Basilic  18   Gastrostomy/Enterostomy Gastrostomy 18 Fr. LUQ 06/20/21  0832  LUQ  21   Incision (Closed) 06/09/21 Neck Other (Comment) 06/09/21  0327  -- 32   Incision (Closed) 06/20/21 Abdomen Other (Comment) 06/20/21  0810  -- 21   Incision - 3 Ports Abdomen 1: Right;Lateral 2: Right;Lateral;Medial 3: Umbilicus 93/81/01  7510  -- 21   Tracheostomy Other (Comment) 6 mm Uncuffed 06/09/21  0300  6 mm  32   Pressure Injury 06/16/21 Sacrum Medial Stage 2 -  Partial thickness loss of dermis presenting as a shallow open injury with a red, pink wound bed without slough. 06/16/21  2000  -- 25             ASSESSMENT/PLAN:  Assessment: Principal Problem:   Acute respiratory failure with hypoxia (HCC) Active Problems:   Laryngeal cancer (HCC)   Goals of care, counseling/discussion   Hypotension   Protein-calorie malnutrition, severe   Drug abuse and dependence ([place])   Community acquired pneumonia due to Pneumococcus [facility])   Anxiety state   Aspiration pneumonia of right lower lobe (HCC)   Supraglottic airway obstruction   Chronic HFrEF (heart failure with reduced ejection fraction) ([place])   Tracheostomy status (HCC)   Bacterial endocarditis   Pressure injury of skin   S/P percutaneous endoscopic gastrostomy (PEG) tube placement [facility])   Plan: #  Tracheostomy dependent #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 Persistent discomfort at trach site that is still unchanged. Patient has PRN pain regimen for moderate-severe pain. -Spoke with Dr. Fredric Dine with ENT for further recommendations: continue to work with therapy, diligent trach care and hygiene, keep the humidified collar on. -Continue chloraseptic spray as needed -Routine trach care -Guaifenesin every 6 hours as needed   #Severe protein-caloria malnutrition #PEG tube dependent No acute changes. Weight is  stable. -RD consulted, appreciate their assistance -Continue night tube feeding -Encourage PO intake as tolerated -Zofran PRN - be cautious with QT given on chronic methadone -Daily weights   #Social determinants of health SW update 06/16 that he may have an apartment home in the next few weeks and their contact will be going to see the home on Wednesday. Further updates to come as available.   #Chronic hypotension BP stable. -MAP goal >60 -Continue midodrine.   #Opioid use disorder #Chronic methadone use -Continue daily methadone   #Anxiety #Insomnia Stable. -Continue duloxetine -Continue trazodone QHS -Continue hydroxyzine '100mg'$  every 6 hours as needed   Best Practice: Diet: Regular diet and tube feeds (nighttime trickle) IVF: None VTE: rivaroxaban (XARELTO) tablet 10 mg Start: 07/09/21 0929 Code: Full AB: None DISPO: Anticipated discharge pending Home enviroment access/layout.  Signature: Farrel Gordon, D.O.  Internal Medicine Resident, PGY-1 [facility]  Pager: [phone] 11:35 AM, 07/16/2021   Please contact the on call pager after 5 pm and on weekends at [phone].

## 2021-07-16 NOTE — Progress Notes (Signed)
Refusal of chg bath and daily weight, pt is aware this is needed and says he will do when he wakes up more during day shift

## 2021-07-16 NOTE — TOC Progression Note (Signed)
Transition of Care [facility]) - Progression Note    Patient Details  Name: David [name] MRN: [id number] Date of Birth: 12-May-1975  Transition of Care [facility]) CM/SW [place], RN Phone Number: 07/16/2021, 2:49 PM  Clinical Narrative:    CM called and spoke with Bethanne Ginger, CM with [employer] and referral placed for dme including trach supplies, suction equipment, tube feeding pump and Nutritional supplements for tube feedings.  Blank, CM is aware and will place order closer to patient's discharge to home.  [facility] is calling an apartment complex on Wednesday of this week to check on availability of [address] [place] for this patient but [facility] is unaware if the patient will be accepted for the apartment or when the apartment will be available for the patient to move into it for discharge purposes from the hospital.  Discharge date at this time is unable to be determined.   Expected Discharge Plan: [place] Barriers to Discharge: Homeless with medical needs  Expected Discharge Plan and Services Expected Discharge Plan: [place] In-house Referral: Clinical Social Work Discharge Planning Services: CM Consult Post Acute Care Choice: [place] arrangements for the past 2 months: Single Family Home                                       Social Determinants of Health (SDOH) Interventions    Readmission Risk Interventions     No data to display

## 2021-07-17 DIAGNOSIS — J9601 Acute respiratory failure with hypoxia: Secondary | ICD-10-CM | POA: Diagnosis not present

## 2021-07-17 MED ORDER — IBUPROFEN 200 MG PO TABS
400.0000 mg | ORAL_TABLET | Freq: Four times a day (QID) | ORAL | Status: AC
Start: 2021-07-17 — End: 2021-07-19
  Administered 2021-07-17 – 2021-07-19 (×8): 400 mg via ORAL
  Filled 2021-07-17 (×8): qty 2

## 2021-07-17 MED ORDER — OSMOLITE 1.5 CAL PO LIQD
1000.0000 mL | ORAL | Status: DC
Start: 2021-07-17 — End: 2021-08-23
  Administered 2021-07-17 – 2021-08-21 (×36): 1000 mL
  Filled 2021-07-17 (×37): qty 1000

## 2021-07-17 NOTE — Progress Notes (Signed)
HD#[id number] SUBJECTIVE:  Patient Summary: David [name] is a 46 y.o. 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 Hospital course has been complicated by ICU admission requiring tracheostomy, bacteremia, concern for endocarditis, and persistent hypotension. He has completed a 4 week course of IV antibiotics and is stable for discharge once a safe plan has been formed.  Overnight Events: None  Interim History: Patient has continued pain discomfort from trach that is particularly bothersome to him. Otherwise he has no complaints.  OBJECTIVE:  Vital Signs: Vitals:   07/17/21 0009 07/17/21 0437 07/17/21 0825 07/17/21 1000  BP:    95/71  Pulse:  79 83 77  Resp:  '18 18 18  '$ Temp:    98.2 F (36.8 C)  TempSrc:    Oral  SpO2: 100% 98% 98% 99%  Weight:      Height:       Supplemental O2:  Trach collar SpO2: 99 % O2 Flow Rate (L/min): 5 L/min FiO2 (%): 21 %  Filed Weights   07/12/21 1153 07/13/21 1500 07/14/21 1200  Weight: 37.4 kg 37.5 kg 37.4 kg     Intake/Output Summary (Last 24 hours) at 07/17/2021 1119 Last data filed at 07/16/2021 2300 Gross per 24 hour  Intake 871.33 ml  Output 700 ml  Net 171.33 ml    Net IO Since Admission: 6,280.6 mL [07/17/21 1119]  Physical Exam: Constitutional:Chronically ill and cachectic, in no acute distress. Cardio:Regular rate and rhythm. Pulm:Normal work of breathing on trach collar. Abdomen:Soft, nontender, nondistended. VQQ:VZDGL all extremities spontaneously.  Skin:Warm and dry. Neuro:Alert and oriented x3. No focal deficit noted. Psych:Pleasant mood and affect.  Patient Lines/Drains/Airways Status     Active Line/Drains/Airways     Name Placement date Placement time Site Days   Peripheral IV 06/07/21 20 G 1.88" Anterior;Proximal;Right Forearm 06/07/21  0807  Forearm  34   Midline Single Lumen  87/56/43 Left Basilic 10 cm 0 cm 32/95/18  8416  Basilic  18   Gastrostomy/Enterostomy Gastrostomy 18 Fr. LUQ 06/20/21  0832  LUQ  21   Incision (Closed) 06/09/21 Neck Other (Comment) 06/09/21  0327  -- 32   Incision (Closed) 06/20/21 Abdomen Other (Comment) 06/20/21  0810  -- 21   Incision - 3 Ports Abdomen 1: Right;Lateral 2: Right;Lateral;Medial 3: Umbilicus 60/63/01  6010  -- 21   Tracheostomy Other (Comment) 6 mm Uncuffed 06/09/21  0300  6 mm  32   Pressure Injury 06/16/21 Sacrum Medial Stage 2 -  Partial thickness loss of dermis presenting as a shallow open injury with a red, pink wound bed without slough. 06/16/21  2000  -- 25             ASSESSMENT/PLAN:  Assessment: Principal Problem:   Acute respiratory failure with hypoxia (HCC) Active Problems:   Laryngeal cancer (HCC)   Goals of care, counseling/discussion   Hypotension   Protein-calorie malnutrition, severe   Drug abuse and dependence ([place])   Community acquired pneumonia due to Pneumococcus [facility])   Anxiety state   Aspiration pneumonia of right lower lobe (HCC)   Supraglottic airway obstruction   Chronic HFrEF (heart failure with reduced ejection fraction) (HCC)   Tracheostomy status (HCC)   Bacterial endocarditis   Pressure injury of skin   S/P percutaneous endoscopic gastrostomy (PEG) tube placement ([place])   Plan: #Tracheostomy dependent #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 Persistent  discomfort at trach site that is still unchanged but particularly bothersome. He reports that he has not had significant relief from current pain regimen. He reports that he does have the humidified air on most of the day but takes it off when he speaks with [name] or eats. -Will schedule ibuprofen 400 mg q6h for 2 days for trach pain and monitor for improvement; continue other scheduled and PRN pain modalities as ordered -Continue chloraseptic spray as needed -Routine trach care -Guaifenesin every 6 hours as needed    #Severe protein-caloria malnutrition #PEG tube dependent No acute changes. Weight is stable. -RD consulted, appreciate their assistance -Continue night tube feeding -Encourage PO intake as tolerated -Zofran PRN - be cautious with QT given on chronic methadone -Daily weights   #Social determinants of health SW update 06/16 that he may have an apartment home in the next few weeks and their contact will be going to see the home on Wednesday. Further updates to come as available.   #Chronic hypotension BP stable. -MAP goal >60 -Continue midodrine.   #Opioid use disorder #Chronic methadone use -Continue daily methadone   #Anxiety #Insomnia Stable. -Continue duloxetine -Continue trazodone QHS -Continue hydroxyzine '100mg'$  every 6 hours as needed   Best Practice: Diet: Regular diet and tube feeds (nighttime trickle) IVF: None VTE: rivaroxaban (XARELTO) tablet 10 mg Start: 07/09/21 0929 Code: Full AB: None DISPO: Anticipated discharge pending Home enviroment access/layout.  Signature: Farrel Gordon, D.O.  Internal Medicine Resident, PGY-1 [facility]  Pager: [phone] 11:19 AM, 07/17/2021   Please contact the on call pager after 5 pm and on weekends at [phone].

## 2021-07-17 NOTE — Progress Notes (Signed)
Physical Therapy Treatment Patient Details Name: David [name] MRN: [id number] DOB: 1975/02/08 Today's Date: 07/17/2021   History of Present Illness David [name] is a 46 y.o. male admitted for sepsis and acute hypoxic respiratory failure. Hospital course has been complicated by ICU admission requiring tracheostomy, bacteremia, concern for endocarditis, and persistent hypotension. He has completed a 4 week course of IV antibiotics and is stable for discharge once a safe plan has been formed. 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    PT Comments    Pt received in supine, agreeable to therapy session and with fair participation and tolerance for transfer and gait training with rollator. Pt limited due to fatigue, able to perform household distance gait trial and transfers with Supervision. Pt encouraged to try stair negotiation training to simulate curb steps and for BLE strengthening but defers this date. Pt asking about whether he can have trach decannulated, defer to medical team, RN notified. Pt continues to benefit from PT services to progress toward functional mobility goals.   Recommendations for follow up therapy are one component of a multi-disciplinary discharge planning process, led by the attending physician.  Recommendations may be updated based on patient status, additional functional criteria and insurance authorization.  Follow Up Recommendations  Home health PT     Assistance Recommended at Discharge Intermittent Supervision/Assistance  Patient can return home with the following A little help with walking and/or transfers;A little help with bathing/dressing/bathroom;Assist for transportation;Assistance with cooking/housework;Help with stairs or ramp for entrance   Equipment Recommendations  Rollator (4 wheels)    Recommendations for Other Services       Precautions / Restrictions  Precautions Precautions: Fall Precaution Comments: trach, g-tube Restrictions Weight Bearing Restrictions: No     Mobility  Bed Mobility Overal bed mobility: Modified Independent             General bed mobility comments: use of bed features/rail    Transfers Overall transfer level: Needs assistance Equipment used: Rollator (4 wheels) Transfers: Sit to/from Stand Sit to Stand: Supervision           General transfer comment: from air bed<>rollator    Ambulation/Gait Ambulation/Gait assistance: Supervision Gait Distance (Feet): 80 Feet Assistive device: Rollator (4 wheels) Gait Pattern/deviations: Step-through pattern, Decreased stride length, Trunk flexed, Narrow base of support       General Gait Details: distance limited due to pt c/o lightheadedness and need for suctioning; SpO2 99% after pt suctioned his trach, HR to 85 bpm post-exertion; forward head/rounded shoulders posture and reliant on rollator.   Stairs Stairs:  (pt declines to attempt platform step when encouraged to)           Balance Overall balance assessment: Needs assistance Sitting-balance support: Feet supported, Bilateral upper extremity supported Sitting balance-Leahy Scale: Good     Standing balance support: Bilateral upper extremity supported, During functional activity Standing balance-Leahy Scale: Poor Standing balance comment: pt reliant on rollator          Cognition Arousal/Alertness: Awake/alert Behavior During Therapy: WFL for tasks assessed/performed Overall Cognitive Status: Within Functional Limits for tasks assessed             General Comments: using PMSV to communicate, needs reminder to place valve in trach when speaking as he frequently forgets and takes it out after speaking to suction often. Emotionally labile and frustrated by continued hospitalization and pt states "nobody cares about me. I want this out" (pointing to trach).  Exercises       General Comments General comments (skin integrity, edema, etc.): BP 119/83 (96) supine post-exertion; pt deferred seated BP due to fatigue after ambulation; HR 72-85 bpm after return to supine, SpO2 WFL on RA      Pertinent Vitals/Pain Pain Assessment Pain Assessment: Faces Faces Pain Scale: Hurts a little bit Pain Location: generalized Pain Descriptors / Indicators: Discomfort, Grimacing Pain Intervention(s): Monitored during session, Repositioned, Limited activity within patient's tolerance     PT Goals (current goals can now be found in the care plan section) Acute Rehab PT Goals Patient Stated Goal: To get rid of trach if medically appropriate, to find a place to live. PT Goal Formulation: With patient Time For Goal Achievement: 07/20/21 Progress towards PT goals: Progressing toward goals    Frequency    Min 3X/week      PT Plan Current plan remains appropriate       AM-PAC PT "6 Clicks" Mobility   Outcome Measure  Help needed turning from your back to your side while in a flat bed without using bedrails?: None Help needed moving from lying on your back to sitting on the side of a flat bed without using bedrails?: None Help needed moving to and from a bed to a chair (including a wheelchair)?: A Little Help needed standing up from a chair using your arms (e.g., wheelchair or bedside chair)?: A Little Help needed to walk in hospital room?: A Little Help needed climbing 3-5 steps with a railing? : A Lot 6 Click Score: 19    End of Session   Activity Tolerance: Patient tolerated treatment well Patient left: in bed;with call bell/phone within reach Nurse Communication: Mobility status;Other (comment) (pt asking about decannulating trach) PT Visit Diagnosis: Unsteadiness on feet (R26.81);Other abnormalities of gait and mobility (R26.89);Muscle weakness (generalized) (M62.81);Adult, failure to thrive (R62.7)     Time: 7209-4709 PT Time Calculation (min) (ACUTE  ONLY): 17 min  Charges:  $Gait Training: 8-22 mins                     Kiara Mcdowell P., PTA Acute Rehabilitation Services Secure Chat Preferred 9a-5:30pm Office: [place] 07/17/2021, 3:46 PM

## 2021-07-17 NOTE — Progress Notes (Signed)
Nutrition Follow-up  DOCUMENTATION CODES:   Severe malnutrition in context of chronic illness, Underweight  INTERVENTION:   Tube Feeds via PEG: Increase Osmolite 1.5 @ 85 mL/hr x 12 hours (1020 mL/day) 45 mL ProSource TF - TID 125 mL free water flush q6h Provides 1650 kcal, 97 gm of protein, and 1277 mL free water (TF + flush) daily.  Continue Multivitamin w/ minerals daily Encourage good PO intake   NUTRITION DIAGNOSIS:   Severe Malnutrition related to chronic illness (head and neck cancer) as evidenced by severe muscle depletion, severe fat depletion, percent weight loss (23% weight loss within 3 months). - Ongoing, being addressed via TF  GOAL:   Patient will meet greater than or equal to 90% of their needs - Met via TF  MONITOR:   PO intake, Labs, Weight trends, TF tolerance  REASON FOR ASSESSMENT:   Consult Assessment of nutrition requirement/status, Enteral/tube feeding initiation and management  ASSESSMENT:   46 yo male admitted with respiratory failure, suspected aspiration PNA. 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  5/12 - Cortrak placed (tip post pyloric) 5/13 - TF started; Trach placed 5/15 - diet advanced to Dysphagia 3, Honey thick liquids 5/22 - attempted placement of PEG in IR (not placed 2/2 anatomy) 5/23 - Cortrak removed 5/24 - PEG placed 5/26 - diet advanced to regular, Honey Thick liquids 5/30 - transitioned to bolus feeds 6/13 - transitioned to nocturnal feeds  Pt resting in bed. No complaints other than his trach bothering him. States that the mucus has improved some. Denies any intolerance to TF at this time.  RD observed pt breakfast tray in room, <25% ate.   Pt PO intake varies from 10-75%, with an average of 37%.   Encouraged pt to continue to get daily weights to be able to track progress.    Medications reviewed and include:  Folic acid, MVI, Miralax, Protonix, Senokot, Thiamine Labs reviewed.  Diet Order:   Diet Order             Diet regular Room service appropriate? Yes; Fluid consistency: Honey Thick  Diet effective now                   EDUCATION NEEDS:   No education needs have been identified at this time  Skin:  Skin Assessment: Skin Integrity Issues: Skin Integrity Issues:: Stage II Stage II: Sacrum  Last BM:  6/17  Height:   Ht Readings from Last 1 Encounters:  06/07/21 _0  (1.803 m)    Weight:   Wt Readings from Last 1 Encounters:  07/14/21 37.4 kg    Ideal Body Weight:  78.2 kg  BMI:  Body mass index is 11.49 kg/m.  Estimated Nutritional Needs:   Kcal:  1500-1700  Protein:  65-75 gm  Fluid:  1.5-1.7 L    Hermina Barters RD, LDN Clinical Dietitian See [facility] for contact information.

## 2021-07-17 NOTE — Progress Notes (Signed)
Patient complaining of trach pain. Foam pad changed under trach and cleansed around the trach. Area around trach is red and irritated. Explained to patient the importance of him letting RT do trach care so it doesn't get worse.

## 2021-07-17 NOTE — Plan of Care (Signed)
  Problem: Coping: Goal: Level of anxiety will decrease Outcome: Progressing   Problem: Pain Managment: Goal: General experience of comfort will improve Outcome: Progressing   Problem: Safety: Goal: Ability to remain free from injury will improve Outcome: Progressing   Problem: Skin Integrity: Goal: Risk for impaired skin integrity will decrease Outcome: Progressing   

## 2021-07-18 NOTE — TOC Progression Note (Signed)
Transition of Care [facility]) - Progression Note    Patient Details  Name: David [name] MRN: [id number] Date of Birth: [birth year]  Transition of Care [facility]) CM/SW [place], RN Phone Number: 07/18/2021, 2:38 PM  Clinical Narrative:    CM called and spoke with Jackelyn Poling, [place] at [facility] and the patient will have an available apartment in the next week - but will not be able to move in to the apartment until it is inspected by [facility] since he was offered a [address] voucher.  I spoke with the patient and updated him that he can likely discharge to the apartment in the next 2-3 weeks.  The patient lives alone - so private duty nursing is not an option for the patient.  I asked that Jackelyn Poling provide the address of the apartment so that I can send referral for personal care services.  The [facility] and the patient will need to have family provide transportation.  The patient's mother plans to assist with groceries for the patient.  Debbie, [place] with [employer] plans to assist with providing move in to the apartment so that DME supplies can be delivered to the home through [place].  I spoke to [facility] earlier in the week and requested dme for trach supplies, suction equipment and tube feeding supplies including pump closer to discharge to the apartment.  I spoke with the patient to offer choice regarding Palliative care services and the patient did not have a preference.  I called and placed a referral to [facility] for outpatient palliative care and the patient was updated and agreeable.  CM and MSw with DTP Team will continue to follow the patient for discharge planning to [facility] apartment in the next 2-3 weeks.    Expected Discharge Plan: [place] Barriers to Discharge: Homeless with medical needs  Expected Discharge Plan and Services Expected Discharge Plan: [place] In-house Referral: Clinical Social Work Discharge Planning Services: CM Consult Post Acute Care Choice: [place] arrangements for the past 2 months: Single Family Home                                       Social Determinants of Health (SDOH) Interventions    Readmission Risk Interventions     No data to display

## 2021-07-18 NOTE — Procedures (Signed)
Tracheostomy Change Note  Patient Details:   Name: David [name] DOB: 1976/01/16 MRN: [id number]    Airway Documentation:     Evaluation  O2 sats: stable throughout Complications: No apparent complications Patient did tolerate procedure well. Bilateral Breath Sounds: Clear, Diminished    Pts trach downsized to #4 shiley cfls. Color changed noted on ETCO2. Vitals stable throughout. No complications. Mepilex placed under trach site to prevent anymore irritation and redness. RT will continue to monitor.   Tobi Bastos 07/18/2021, 4:02 PM

## 2021-07-18 NOTE — Plan of Care (Signed)
  Problem: Respiratory: Goal: Ability to maintain adequate ventilation will improve Outcome: Progressing Goal: Ability to maintain a clear airway will improve Outcome: Progressing   Problem: Nutrition: Goal: Adequate nutrition will be maintained Outcome: Progressing   Problem: Pain Managment: Goal: General experience of comfort will improve Outcome: Progressing   Problem: Activity: Goal: Ability to tolerate increased activity will improve Outcome: Not Progressing   Problem: Clinical Measurements: Goal: Ability to maintain a body temperature in the normal range will improve Outcome: Not Progressing   Problem: Activity: Goal: Risk for activity intolerance will decrease Outcome: Not Progressing   Problem: Coping: Goal: Level of anxiety will decrease Outcome: Not Progressing

## 2021-07-18 NOTE — Progress Notes (Signed)
Physical Therapy Treatment Patient Details Name: David [name] MRN: [id number] DOB: 1975-09-16 Today's Date: 07/18/2021   History of Present Illness David [name] is a 46 y.o. male admitted for sepsis and acute hypoxic respiratory failure. Hospital course has been complicated by ICU admission requiring tracheostomy, bacteremia, concern for endocarditis, and persistent hypotension. He has completed a 4 week course of IV antibiotics and is stable for discharge once a safe plan has been formed. 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    PT Comments    Pt received in supine, agreeable to therapy session with encouragement and with fair participation. Pt limited due to fatigue, dyspnea with standing at bedside and soft BP with possible sx orthostatic hypotension. Pt BP 84/58 supine prior to standing and 96/54 supine post-standing, standing BP reading likely inaccurate (see below) all taken on L leg due to R arm reading much lower. HR WFL, SpO2 WFL on RA per sensor. Pt frustrated by dyspnea and refusing ambulation in room/hallway despite encouragement and discussion on likely DC in next 2 weeks, pt hasn't been OOB much. Encouraged him to ambulate with nursing or mobility team. Pt continues to benefit from PT services to progress toward functional mobility goals.    Recommendations for follow up therapy are one component of a multi-disciplinary discharge planning process, led by the attending physician.  Recommendations may be updated based on patient status, additional functional criteria and insurance authorization.  Follow Up Recommendations  Home health PT     Assistance Recommended at Discharge Intermittent Supervision/Assistance  Patient can return home with the following A little help with walking and/or transfers;A little help with bathing/dressing/bathroom;Assist for transportation;Assistance with cooking/housework;Help with  stairs or ramp for entrance   Equipment Recommendations  Rollator (4 wheels)    Recommendations for Other Services       Precautions / Restrictions Precautions Precautions: Fall Precaution Comments: trach, g-tube Restrictions Weight Bearing Restrictions: No     Mobility  Bed Mobility Overal bed mobility: Modified Independent             General bed mobility comments: use of bed features/rail    Transfers Overall transfer level: Needs assistance Equipment used: Rollator (4 wheels) Transfers: Sit to/from Stand Sit to Stand: Min guard           General transfer comment: from air bed<>rollator, min guard for safety    Ambulation/Gait               General Gait Details: pt refusing      Balance Overall balance assessment: Needs assistance Sitting-balance support: Feet supported, Bilateral upper extremity supported Sitting balance-Leahy Scale: Good     Standing balance support: Bilateral upper extremity supported, During functional activity Standing balance-Leahy Scale: Poor Standing balance comment: pt reliant on rollator, given min guard for safety and pt c/o dyspnea through trach/difficulty breathing limiting his standing tolerance to ~1 minute. pt refusing gait                            Cognition Arousal/Alertness: Awake/alert Behavior During Therapy: WFL for tasks assessed/performed Overall Cognitive Status: Within Functional Limits for tasks assessed                                 General Comments: using PMSV to communicate, pt frustrated as his voice is hoarse and therapist frequently asking  him to repeat himself, air bed/suction in room are loud and make it hard to hear him. Emotionally labile when pt encouraged to attempt sitting EOB/standing more than 1 minute and pt c/o shortness of breath. Per chart review, plan for smaller trach cuff today, pt may be anxious regarding this change?        Exercises General  Exercises - Lower Extremity Long Arc Quad: AROM, Both, 10 reps, Seated (bed in chair posture) Hip Flexion/Marching: AROM, Both, 10 reps, Seated (bed in chair posture) Other Exercises Other Exercises: STS x 2 reps for BLE strengthening, pt refusing to perform more reps    General Comments General comments (skin integrity, edema, etc.): BP taken in R UE reading 69/55, then taken in L leg reading 84/58, standing reading 148/123 (132) likely inaccurate, taken supine after standing BP 96/54 (in L leg), RN notified; HR 80's bpm resting and to 100 bpm standing      Pertinent Vitals/Pain Pain Assessment Pain Assessment: Faces Faces Pain Scale: Hurts a little bit Pain Location: generalized Pain Descriptors / Indicators: Discomfort, Grimacing Pain Intervention(s): Monitored during session, Repositioned     PT Goals (current goals can now be found in the care plan section) Acute Rehab PT Goals Patient Stated Goal: To get rid of trach if medically appropriate, to find a place to live. PT Goal Formulation: With patient Time For Goal Achievement: 07/20/21 Progress towards PT goals: Progressing toward goals    Frequency    Min 3X/week      PT Plan Current plan remains appropriate       AM-PAC PT "6 Clicks" Mobility   Outcome Measure  Help needed turning from your back to your side while in a flat bed without using bedrails?: None Help needed moving from lying on your back to sitting on the side of a flat bed without using bedrails?: None Help needed moving to and from a bed to a chair (including a wheelchair)?: A Little Help needed standing up from a chair using your arms (e.g., wheelchair or bedside chair)?: A Little Help needed to walk in hospital room?: Total Help needed climbing 3-5 steps with a railing? : Total 6 Click Score: 16    End of Session   Activity Tolerance: Treatment limited secondary to medical complications (Comment);Other (comment) (pt appearing anxious when  standing despite anxiety meds prior to session, he reports minimal lightheadedness, BP soft supine and likely inaccurate reading in stance (elevated)) Patient left: in bed;with call bell/phone within reach;Other (comment) (pt left wtih bed in chair posture and encouraged to sit up at least 30 mins in that posture but pt lowering HOB after PTA left room) Nurse Communication: Mobility status;Other (comment) (BP soft, likely inaccurate orthostatic reading obtained) PT Visit Diagnosis: Unsteadiness on feet (R26.81);Other abnormalities of gait and mobility (R26.89);Muscle weakness (generalized) (M62.81);Adult, failure to thrive (R62.7)     Time: 2979-8921 PT Time Calculation (min) (ACUTE ONLY): 19 min  Charges:  $Therapeutic Exercise: 8-22 mins                     Delayne Sanzo P., PTA Acute Rehabilitation Services Secure Chat Preferred 9a-5:30pm Office: [place] 07/18/2021, 3:21 PM

## 2021-07-18 NOTE — Progress Notes (Signed)
   07/18/21 1110  Assess: MEWS Score  Pulse Rate (!) 107  Resp 18  SpO2 96 %  O2 Device Room Air  Assess: MEWS Score  MEWS Temp 0  MEWS Systolic 1  MEWS Pulse 1  MEWS RR 0  MEWS LOC 0  MEWS Score 2  MEWS Score Color Yellow  Assess: if the MEWS score is Yellow or Red  Were vital signs taken at a resting state? No  Focused Assessment No change from prior assessment  Does the patient meet 2 or more of the SIRS criteria? No  MEWS guidelines implemented *See Row Information* No, previously yellow, continue vital signs every 4 hours  Assess: SIRS CRITERIA  SIRS Temperature  0  SIRS Pulse 1  SIRS Respirations  0  SIRS WBC 0  SIRS Score Sum  1

## 2021-07-18 NOTE — Progress Notes (Signed)
HD#42 SUBJECTIVE:  Patient Summary: David [name] is a 46 y.o. 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 Hospital course has been complicated by ICU admission requiring tracheostomy, bacteremia, concern for endocarditis, and persistent hypotension. He has completed a 4 week course of IV antibiotics and is stable for discharge once a safe plan has been formed.  Overnight Events: Patient had possible panic attack early evening 06/20. When assessed by our team he was calm, comfortably playing Xbox. No additional medication was given.  Interim History: Patient is interested in speaking with ENT about his trach going forward. He does not voice specific complaints this morning.  OBJECTIVE:  Vital Signs: Vitals:   07/17/21 1944 07/17/21 2355 07/18/21 0744 07/18/21 0821  BP: 90/64   (!) 100/58  Pulse: 88  79 74  Resp:   18   Temp: 98.7 F (37.1 C)   98.6 F (37 C)  TempSrc: Oral   Oral  SpO2: 96% 98% 97% 99%  Weight:      Height:       Supplemental O2:  Trach collar SpO2: 99 % O2 Flow Rate (L/min): 5 L/min FiO2 (%): 21 %  Filed Weights   07/12/21 1153 07/13/21 1500 07/14/21 1200  Weight: 37.4 kg 37.5 kg 37.4 kg     Intake/Output Summary (Last 24 hours) at 07/18/2021 1113 Last data filed at 07/18/2021 0800 Gross per 24 hour  Intake 25 ml  Output 1775 ml  Net -1750 ml    Net IO Since Admission: 4,530.6 mL [07/18/21 1113]  Physical Exam: Constitutional:Chronically ill and cachectic, in no acute distress. Cardio:Regular rate and rhythm. Pulm:Normal work of breathing on trach collar. Abdomen:Soft, nontender, nondistended. EXN:TZGYF all extremities spontaneously.  Skin:Warm and dry. Neuro:Alert and oriented x3. No focal deficit noted. Psych:Pleasant mood and affect.  Patient Lines/Drains/Airways Status     Active Line/Drains/Airways      Name Placement date Placement time Site Days   Peripheral IV 06/07/21 20 G 1.88" Anterior;Proximal;Right Forearm 06/07/21  0807  Forearm  34   Midline Single Lumen 74/94/49 Left Basilic 10 cm 0 cm 67/59/16  3846  Basilic  18   Gastrostomy/Enterostomy Gastrostomy 18 Fr. LUQ 06/20/21  0832  LUQ  21   Incision (Closed) 06/09/21 Neck Other (Comment) 06/09/21  0327  -- 32   Incision (Closed) 06/20/21 Abdomen Other (Comment) 06/20/21  0810  -- 21   Incision - 3 Ports Abdomen 1: Right;Lateral 2: Right;Lateral;Medial 3: Umbilicus 65/99/35  7017  -- 21   Tracheostomy Other (Comment) 6 mm Uncuffed 06/09/21  0300  6 mm  32   Pressure Injury 06/16/21 Sacrum Medial Stage 2 -  Partial thickness loss of dermis presenting as a shallow open injury with a red, pink wound bed without slough. 06/16/21  2000  -- 25             ASSESSMENT/PLAN:  Assessment: Principal Problem:   Acute respiratory failure with hypoxia (HCC) Active Problems:   Laryngeal cancer (HCC)   Goals of care, counseling/discussion   Hypotension   Protein-calorie malnutrition, severe   Drug abuse and dependence ([place])   Community acquired pneumonia due to Pneumococcus [facility])   Anxiety state   Aspiration pneumonia of right lower lobe (HCC)   Supraglottic airway obstruction   Chronic HFrEF (heart failure with reduced ejection fraction) ([place])   Tracheostomy status (HCC)   Bacterial endocarditis  Pressure injury of skin   S/P percutaneous endoscopic gastrostomy (PEG) tube placement [facility])   Plan: #Tracheostomy dependent #Stage IV squamous cell carcinoma of larynx Stable, irritation and pain at trach site persists. -Continue scheduled ibuprofen 400 mg q6h for 1 additional day for trach pain and monitor for improvement; continue other scheduled and PRN pain modalities as ordered -Will reach out to ENT to come to bedside and answer patient's questions about trach going forward -Continue chloraseptic spray as needed -Routine  trach care -Guaifenesin every 6 hours as needed   #Severe protein-caloria malnutrition #PEG tube dependent Feed rate was increased overnight 06/20 and patient tolerated this well. -RD consulted, appreciate their assistance -Continue night tube feeding -Encourage PO intake as tolerated -Zofran PRN - be cautious with QT given on chronic methadone -Daily weights   #Social determinants of health SW update 06/16 that he may have an apartment home in the next few weeks and their contact will be going to see the home Today. Further updates to come as available.   #Chronic hypotension BP stable. -MAP goal >60 -Continue midodrine.   #Opioid use disorder #Chronic methadone use -Continue daily methadone   #Anxiety #Insomnia Stable. -Continue duloxetine -Continue trazodone QHS -Continue hydroxyzine '100mg'$  every 6 hours as needed   Best Practice: Diet: Regular diet and tube feeds (nighttime trickle) IVF: None VTE: rivaroxaban (XARELTO) tablet 10 mg Start: 07/09/21 0929 Code: Full AB: None DISPO: Anticipated discharge pending Home enviroment access/layout.  Signature: Farrel Gordon, D.O.  Internal Medicine Resident, PGY-1 [facility]  Pager: [phone] 11:13 AM, 07/18/2021   Please contact the on call pager after 5 pm and on weekends at [phone].

## 2021-07-18 NOTE — Progress Notes (Signed)
[facility] Liaison Note  Notified by [facility] of patient/family request of [facility] Paliative services.  [facility] hospital liaison will follow patient for discharge disposition.   Please call with any questions/concerns.    Thank you for the opportunity to participate in this patient's care.   Daphene Calamity, MSW [facility] Liaison  [phone]

## 2021-07-18 NOTE — Progress Notes (Signed)
ENT contacted by primary team to stop by and discussed patient's trach, as he had some outstanding questions.  Patient states that he has experienced some discomfort along the inferior flange of the trach, as it is rubbing against his sternum.  He also feels mild discomfort with swallowing, which is improved with medications.  On exam, trach is in good position, with Mepilex in place under inferior flanges a skin barrier.  Patient is phonating well with hoarseness with Passy-Muir valve.  Tolerated finger occlusion of trach quite well  Patient stable to downsize trach to 4 uncuffed [place].  Order for RT to perform trach change placed today.  No plans for decannulation at this time.  Recommend continued speech and swallow therapy.  We will plan for reevaluation of upper airway in outpatient setting once patient is discharged.  Posttreatment PET/CT also pending, has not been performed due to patient's hospitalization.  Thank you for allowing me to participate in the care of this patient. Please do not hesitate to contact me with any questions or concerns.   Jason Coop, [place] [facility] Cell: [phone]

## 2021-07-19 DIAGNOSIS — J9601 Acute respiratory failure with hypoxia: Secondary | ICD-10-CM | POA: Diagnosis not present

## 2021-07-19 NOTE — Progress Notes (Signed)
Speech Language Pathology Treatment: Dysphagia  Patient Details Name: David [name] MRN: [id number] DOB: [birth year] Today's Date: 07/19/2021 Time: 0905-0920 SLP Time Calculation (min) (ACUTE ONLY): 15 min  Assessment / Plan / Recommendation Clinical Impression  Patient seen by SLP for skilled treatment session focused on dysphagia goals. Of note, he had his trach downsized from a 6 cuffed to a #4 cuffless. He reports smaller trach feels better but when asked if his voice was improved with PMV on smaller trach he said he could not tell. He donned PMV and SLP could not discern a difference between vocal quality now versus past sessions. When SLP asking patient what he thought of honey thick liquids he said "its getting old". He was agreeable to trying nectar thick liquids, requesting a coke. When SLP started telling him about recommended strategy for trials of nectar thick liquids (head turn right-tested on MBS last month) he did demonstrate recall of this. He took a sip of nectar thick liquids and did turn his head  to right during swallow but quickly turned head back and set cup down. He closed his eyes and although he would respond to some questions, he did not don his PMV and SLP had a very difficult time understanding him. SLP asked patient if he thought he would thicken his liquids after he has been discharged from the hospital but he seemed to be unsure and did not give a definitive response. When SLP asking further questions he said "I'm tired". SLP then left room. Recommendation continues to be for Regular solids and honey thick liquids. Patient will be here in hospital for the next 2-3 weeks as noted in recent [facility] note, because of the process of getting his apartment approved through [address] [employer] voucher. SLP will continue to follow patient to provide continued education, potential for repeat MBS now that his trach has been downsized, and continued trials of upgraded liquids with learned  precaution of head turn right.    HPI HPI: 46yo male with past medical history of laryngeal cancer s/p chemo and radiation and heroin abuse presented to ED on 06/06/21 from home with complaints of shortness of breath and not being able to eat for several days. Patient was admitted on 06/06/2021 with acute hypoxic respiratory failure with sepsis secondary to aspiration pneumonia, elevated D-dimer, AKI. Developed worsening respiratory distress requiring emergent tracheostomy on 5/13. Patient got PEG on 06/20/21.      SLP Plan  Continue with current plan of care      Recommendations for follow up therapy are one component of a multi-disciplinary discharge planning process, led by the attending physician.  Recommendations may be updated based on patient status, additional functional criteria and insurance authorization.    Recommendations  Diet recommendations: Regular;Honey-thick liquid Liquids provided via: Cup Medication Administration: Whole meds with puree Supervision: Patient able to self feed Compensations: Slow rate;Small sips/bites Postural Changes and/or Swallow Maneuvers: Seated upright 90 degrees      Patient may use Passy-Muir Speech Valve: During all therapies with supervision;During all waking hours (remove during sleep) PMSV Supervision: Intermittent         Oral Care Recommendations: Oral care QID Follow Up Recommendations: Outpatient SLP Assistance recommended at discharge: Intermittent Supervision/Assistance SLP Visit Diagnosis: Aphonia (R49.1);Dysphagia, unspecified (R13.10) Plan: Continue with current plan of care          Sonia Baller, MA, CCC-SLP Speech Therapy

## 2021-07-19 NOTE — Plan of Care (Signed)

## 2021-07-20 DIAGNOSIS — J9601 Acute respiratory failure with hypoxia: Secondary | ICD-10-CM | POA: Diagnosis not present

## 2021-07-20 NOTE — Plan of Care (Signed)
  Problem: Clinical Measurements: Goal: Ability to maintain a body temperature in the normal range will improve Outcome: Progressing   Problem: Respiratory: Goal: Ability to maintain adequate ventilation will improve Outcome: Progressing Goal: Ability to maintain a clear airway will improve Outcome: Progressing   Problem: Education: Goal: Knowledge of General Education information will improve Description: Including pain rating scale, medication(s)/side effects and non-pharmacologic comfort measures Outcome: Progressing   Problem: Health Behavior/Discharge Planning: Goal: Ability to manage health-related needs will improve Outcome: Progressing   Problem: Clinical Measurements: Goal: Ability to maintain clinical measurements within normal limits will improve Outcome: Progressing Goal: Will remain free from infection Outcome: Progressing Goal: Diagnostic test results will improve Outcome: Progressing Goal: Respiratory complications will improve Outcome: Progressing Goal: Cardiovascular complication will be avoided Outcome: Progressing   Problem: Nutrition: Goal: Adequate nutrition will be maintained Outcome: Progressing   Problem: Coping: Goal: Level of anxiety will decrease Outcome: Progressing   Problem: Elimination: Goal: Will not experience complications related to bowel motility Outcome: Progressing Goal: Will not experience complications related to urinary retention Outcome: Progressing   Problem: Safety: Goal: Ability to remain free from injury will improve Outcome: Progressing

## 2021-07-20 NOTE — Progress Notes (Signed)
Physical Therapy Treatment Patient Details Name: David [name] MRN: [id number] DOB: 1975/10/09 Today's Date: 07/20/2021   History of Present Illness David [name] is a 46 y.o. male admitted for sepsis and acute hypoxic respiratory failure. Hospital course has been complicated by ICU admission requiring tracheostomy, bacteremia, concern for endocarditis, and persistent hypotension. He has completed a 4 week course of IV antibiotics and is stable for discharge once a safe plan has been formed. 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    PT Comments    Pt was seen for both exercise and some short walks on side of bed.  He is initially reluctant but is finally quite willing to work and get up to walk.  Follow up with him to try to walk farther in the room and eventually to stairs which is not a large amount needed to succeed at home.  Follow up with acute PT goals as are outlined.  Recommendations for follow up therapy are one component of a multi-disciplinary discharge planning process, led by the attending physician.  Recommendations may be updated based on patient status, additional functional criteria and insurance authorization.  Follow Up Recommendations  Home health PT     Assistance Recommended at Discharge Intermittent Supervision/Assistance  Patient can return home with the following A little help with walking and/or transfers;A little help with bathing/dressing/bathroom;Assist for transportation;Assistance with cooking/housework;Help with stairs or ramp for entrance   Equipment Recommendations  Rollator (4 wheels)    Recommendations for Other Services       Precautions / Restrictions Precautions Precautions: Fall Precaution Comments: trach, g-tube Restrictions Weight Bearing Restrictions: No     Mobility  Bed Mobility Overal bed mobility: Modified Independent                  Transfers Overall  transfer level: Needs assistance Equipment used: Rollator (4 wheels) Transfers: Sit to/from Stand Sit to Stand: Min guard                Ambulation/Gait Ambulation/Gait assistance: Min guard Gait Distance (Feet): 10 Feet Assistive device: Rolling walker (2 wheels) Gait Pattern/deviations: Step-to pattern, Decreased stride length Gait velocity: reduced Gait velocity interpretation: <1.31 ft/sec, indicative of household ambulator   General Gait Details: sidesteps on side of bed   Stairs             Wheelchair Mobility    Modified Rankin (Stroke Patients Only)       Balance Overall balance assessment: Needs assistance Sitting-balance support: Feet supported Sitting balance-Leahy Scale: Good     Standing balance support: Bilateral upper extremity supported, During functional activity Standing balance-Leahy Scale: Poor                              Cognition Arousal/Alertness: Awake/alert Behavior During Therapy: WFL for tasks assessed/performed Overall Cognitive Status: Within Functional Limits for tasks assessed                                 General Comments: PMV for speech        Exercises General Exercises - Lower Extremity Ankle Circles/Pumps: AROM, 5 reps Quad Sets: AROM, 10 reps Gluteal Sets: AROM, 10 reps    General Comments General comments (skin integrity, edema, etc.): Pt is demonstrating better control of standing and has been walking a bit per his report with  nursing.  Has agreed to try things after initially declining with better effort today      Pertinent Vitals/Pain Pain Assessment Pain Assessment: Faces Faces Pain Scale: Hurts a little bit Pain Location: generalized Pain Descriptors / Indicators: Dull, Guarding    Home Living                          Prior Function            PT Goals (current goals can now be found in the care plan section) Acute Rehab PT Goals Patient Stated Goal:  To get rid of trach if medically appropriate, to find a place to live. Progress towards PT goals: Progressing toward goals    Frequency    Min 3X/week      PT Plan Current plan remains appropriate    Co-evaluation              AM-PAC PT "6 Clicks" Mobility   Outcome Measure  Help needed turning from your back to your side while in a flat bed without using bedrails?: None Help needed moving from lying on your back to sitting on the side of a flat bed without using bedrails?: None Help needed moving to and from a bed to a chair (including a wheelchair)?: A Little Help needed standing up from a chair using your arms (e.g., wheelchair or bedside chair)?: A Little Help needed to walk in hospital room?: A Little Help needed climbing 3-5 steps with a railing? : Total 6 Click Score: 18    End of Session Equipment Utilized During Treatment: Oxygen Activity Tolerance: Patient limited by fatigue Patient left: in bed;with call bell/phone within reach;Other (comment) Nurse Communication: Mobility status;Other (comment) PT Visit Diagnosis: Unsteadiness on feet (R26.81);Other abnormalities of gait and mobility (R26.89);Muscle weakness (generalized) (M62.81);Adult, failure to thrive (R62.7)     Time: 4098-1191 PT Time Calculation (min) (ACUTE ONLY): 18 min  Charges:  $Gait Training: 8-22 mins Ivar Drape 07/20/2021, 4:24 PM  Samul Dada, PT PhD Acute Rehab Dept. Number: [facility] [id number] and [facility] [phone]

## 2021-07-22 DIAGNOSIS — J9601 Acute respiratory failure with hypoxia: Secondary | ICD-10-CM | POA: Diagnosis not present

## 2021-07-22 NOTE — Progress Notes (Signed)
Made multiple attempts to mobilize patient have him sit in the chair, and wash up. Pt politely refusing interventions. Will continue to offer mobility and bathing at regular intervals. PT vitals are stable.

## 2021-07-22 NOTE — Progress Notes (Signed)
Mobility Specialist Progress Note   07/22/21 1826  Mobility  Activity Refused mobility   With max encouragement pt declining mobility session for today along w/ any transfers. Reason: Claims he has already walked today and doesn't need to again + also states sitting in chair makes him choke on his spit or any fluid trying to get down. Will have MS f/u tomorrow if time permits.   Frederico Hamman Mobility Specialist Phone Number [phone]

## 2021-07-23 MED ORDER — LIDOCAINE VISCOUS HCL 2 % MT SOLN
15.0000 mL | OROMUCOSAL | Status: DC | PRN
  Administered 2021-07-23 – 2021-08-22 (×58): 15 mL via OROMUCOSAL
  Filled 2021-07-23 (×66): qty 15

## 2021-07-23 MED ORDER — ALUM & MAG HYDROXIDE-SIMETH 200-200-20 MG/5ML PO SUSP: 15.0000 mL | Freq: Four times a day (QID) | ORAL | Status: DC | PRN

## 2021-07-23 NOTE — Progress Notes (Signed)
HD#47 SUBJECTIVE:  Patient Summary: David [name] is a 46 y.o. 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 Hospital course has been complicated by ICU admission requiring tracheostomy, bacteremia, concern for endocarditis, and persistent hypotension. He has completed a 4 week course of IV antibiotics and is stable for discharge once a safe plan has been formed.  Overnight Events: None.  Interim History: Patient is in good spirits this morning after learning that he has had continued weight gain. I engaged in conversation regarding my concern for his mental health and success outside of the hospital at discharge, specifically that I am worried that he is showing signs of depression and that with continued bed rest and not getting up often that his weakness is going to make it extremely difficult to continue recovery outside of the hospital. He explains that he was previously feeling depressed earlier in his stay but that at this time he doesn't have concerns, just that he has some good days and some bad days. He endorses understanding of why I need him to work with PT (so that their outpatient recommendations can be documented and so that he can continue to get stronger) and is in agreement to work with them. He agrees also to getting cleaned up today and I urged him to allow a sheet to be placed on his bed. He reiterates that he does not want his mom receiving medical updates about him. Overall he seems hopeful.  OBJECTIVE:  Vital Signs: Vitals:   07/22/21 2321 07/23/21 0312 07/23/21 0840 07/23/21 0915  BP:   123/75   Pulse: 80 88 92   Resp: 18 20 20    Temp:   98 F (36.7 C)   TempSrc:   Oral   SpO2: 96%  98%   Weight:    39.2 kg  Height:       Supplemental O2:  Trach collar SpO2: 98 % O2 Flow Rate (L/min): 5 L/min FiO2 (%): 21 %  Filed Weights    07/13/21 1500 07/14/21 1200 07/23/21 0915  Weight: 37.5 kg 37.4 kg 39.2 kg     Intake/Output Summary (Last 24 hours) at 07/23/2021 1027 Last data filed at 07/23/2021 0915 Gross per 24 hour  Intake 100 ml  Output 500 ml  Net -400 ml    Net IO Since Admission: 2,495.27 mL [07/23/21 1027]  Physical Exam: Constitutional:Chronically ill and cachectic, in no acute distress. Cardio:Regular rate and rhythm. Pulm:Normal work of breathing on trach collar. Abdomen:Soft, nontender, nondistended. WUJ:WJXBJ all extremities spontaneously.  Skin:Warm and dry. Neuro:Alert and oriented x3. No focal deficit noted. Psych:Normal mood and affect.  Patient Lines/Drains/Airways Status     Active Line/Drains/Airways     Name Placement date Placement time Site Days   Peripheral IV 06/07/21 20 G 1.88" Anterior;Proximal;Right Forearm 06/07/21  0807  Forearm  34   Midline Single Lumen 06/23/21 Left Basilic 10 cm 0 cm 06/23/21  4782  Basilic  18   Gastrostomy/Enterostomy Gastrostomy 18 Fr. LUQ 06/20/21  0832  LUQ  21   Incision (Closed) 06/09/21 Neck Other (Comment) 06/09/21  0327  -- 32   Incision (Closed) 06/20/21 Abdomen Other (Comment) 06/20/21  0810  -- 21   Incision - 3 Ports Abdomen 1: Right;Lateral 2: Right;Lateral;Medial 3: Umbilicus 06/20/21  0805  -- 21   Tracheostomy Other (Comment) 6 mm Uncuffed 06/09/21  0300  6 mm  32   Pressure Injury 06/16/21 Sacrum Medial Stage 2 -  Partial thickness loss of dermis presenting as a shallow open injury with a red, pink wound bed without slough. 06/16/21  2000  -- 25             ASSESSMENT/PLAN:  Assessment: Principal Problem:   Acute respiratory failure with hypoxia (HCC) Active Problems:   Laryngeal cancer (HCC)   Goals of care, counseling/discussion   Hypotension   Protein-calorie malnutrition, severe   Drug abuse and dependence (HCC)   Community acquired pneumonia due to Pneumococcus [facility])   Anxiety state   Aspiration pneumonia of right  lower lobe (HCC)   Supraglottic airway obstruction   Chronic HFrEF (heart failure with reduced ejection fraction) (HCC)   Tracheostomy status (HCC)   Bacterial endocarditis   Pressure injury of skin   S/P percutaneous endoscopic gastrostomy (PEG) tube placement (HCC)   Plan: #Tracheostomy dependent #Hx 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 He reports that his phlegm is worse in the morning but does improve throughout the day. Patient has guaifenesin PRN to loosen phlegm and I have reminded him to ask for it if it is needed. He also endorses discomfort on the R side of his anterior neck that happens after sleeping with his neck bent; I encouraged him to place his pillow and/or blankets in a more supportive position and continue to accept RRT trach care. -Routine trach care per RRT  -Guaifenesin q6h, chloraseptic spray PRN   #Severe protein-calorie malnutrition #PEG tube dependent Stable. Weight is up 1.8 kg in 9 days. This is highly motivating for him. -RD consulted, appreciate their assistance -Continue night tube feeding -Encourage PO intake as tolerated -Zofran PRN - be cautious with QT given on chronic methadone -Daily weights--please time these mid-morning as patient refuses during early morning hours   #Social determinants of health Patient has an apartment home offer pending [facility] inspection and approval of the home. This may take 1-3 weeks.   #Chronic hypotension Stable. -MAP goal >60 -Continue midodrine.   #Opioid use disorder #Chronic methadone use -Continue daily methadone   #Anxiety #Insomnia Stable. -Continue duloxetine, trazodone QHS, hydroxyzine 100mg  q6h PRN anxiety   Best Practice: Diet: Regular diet and tube feeds (nighttime trickle) IVF: None VTE: rivaroxaban (XARELTO) tablet 10 mg Start: 07/09/21 0929 Code: Full AB: None DISPO: Anticipated discharge pending Home enviroment access/layout.  Signature: Champ Mungo, D.O.   Internal Medicine Resident, PGY-1 [facility]  Pager: [phone] 10:27 AM, 07/23/2021   Please contact the on call pager after 5 pm and on weekends at [phone].

## 2021-07-24 ENCOUNTER — Inpatient Hospital Stay (HOSPITAL_COMMUNITY): Payer: Medicaid Other

## 2021-07-24 DIAGNOSIS — J9601 Acute respiratory failure with hypoxia: Secondary | ICD-10-CM | POA: Diagnosis not present

## 2021-07-24 LAB — BASIC METABOLIC PANEL
Anion gap: 9 (ref 5–15)
BUN: 21 mg/dL — ABNORMAL HIGH (ref 6–20)
CO2: 29 mmol/L (ref 22–32)
Calcium: 9.4 mg/dL (ref 8.9–10.3)
Chloride: 96 mmol/L — ABNORMAL LOW (ref 98–111)
Creatinine, Ser: 0.57 mg/dL — ABNORMAL LOW (ref 0.61–1.24)
GFR, Estimated: >60 mL/min (ref >60)
Glucose, Bld: 117 mg/dL — ABNORMAL HIGH (ref 70–99)
Potassium: 4.4 mmol/L (ref 3.5–5.1)
Sodium: 134 mmol/L — ABNORMAL LOW (ref 135–145)

## 2021-07-24 MED ORDER — IOHEXOL 300 MG/ML  SOLN
100.0000 mL | Freq: Once | INTRAMUSCULAR | Status: AC | PRN
  Administered 2021-07-24: 75 mL via INTRAVENOUS

## 2021-07-24 NOTE — Progress Notes (Signed)
HD#48 SUBJECTIVE:  Patient Summary: David [name] is a 46 y.o. 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 Hospital course has been complicated by ICU admission requiring tracheostomy, bacteremia, concern for endocarditis, and persistent hypotension. He has completed a 4 week course of IV antibiotics and is stable for discharge once a safe plan has been formed.  Overnight Events: None.  Interim History: Patient reports worsened pain in his neck and throat that causes him to avoid swallowing at all costs. He felt a new, firm lump on the R side of his neck last night that is worrisome to him.  OBJECTIVE:  Vital Signs: Vitals:   07/23/21 2359 07/24/21 0341 07/24/21 0735 07/24/21 0933  BP:    114/75  Pulse: 84 86 87 87  Resp: 18 18 16    Temp:    98.5 F (36.9 C)  TempSrc:    Oral  SpO2: 96% 98% 98%   Weight:      Height:       Supplemental O2:  Trach collar SpO2: 98 % O2 Flow Rate (L/min): 5 L/min FiO2 (%): 21 %  Filed Weights   07/13/21 1500 07/14/21 1200 07/23/21 0915  Weight: 37.5 kg 37.4 kg 39.2 kg     Intake/Output Summary (Last 24 hours) at 07/24/2021 1053 Last data filed at 07/24/2021 0930 Gross per 24 hour  Intake 100 ml  Output 1700 ml  Net -1600 ml    Net IO Since Admission: 895.27 mL [07/24/21 1053]  Physical Exam: Constitutional:Chronically ill and cachectic, in no acute distress. HENT: R anterolateral neck with firm nodule. Mild tenderness to palpation of the nodule. Poor dentition but no signs of abscess or infection in oropharynx. Cardio:Regular rate and rhythm. Pulm:Normal work of breathing on trach collar. Abdomen:Soft, nontender, nondistended. YNW:GNFAO all extremities spontaneously.  Skin:Warm and dry. Neuro:Alert and oriented x3. No focal deficit noted. Psych:Normal mood and affect.  Patient  Lines/Drains/Airways Status     Active Line/Drains/Airways     Name Placement date Placement time Site Days   Peripheral IV 06/07/21 20 G 1.88" Anterior;Proximal;Right Forearm 06/07/21  0807  Forearm  34   Midline Single Lumen 06/23/21 Left Basilic 10 cm 0 cm 06/23/21  1308  Basilic  18   Gastrostomy/Enterostomy Gastrostomy 18 Fr. LUQ 06/20/21  0832  LUQ  21   Incision (Closed) 06/09/21 Neck Other (Comment) 06/09/21  0327  -- 32   Incision (Closed) 06/20/21 Abdomen Other (Comment) 06/20/21  0810  -- 21   Incision - 3 Ports Abdomen 1: Right;Lateral 2: Right;Lateral;Medial 3: Umbilicus 06/20/21  0805  -- 21   Tracheostomy Other (Comment) 6 mm Uncuffed 06/09/21  0300  6 mm  32   Pressure Injury 06/16/21 Sacrum Medial Stage 2 -  Partial thickness loss of dermis presenting as a shallow open injury with a red, pink wound bed without slough. 06/16/21  2000  -- 25             ASSESSMENT/PLAN:  Assessment: Principal Problem:   Acute respiratory failure with hypoxia (HCC) Active Problems:   Laryngeal cancer (HCC)   Goals of care, counseling/discussion   Hypotension   Protein-calorie malnutrition, severe   Drug abuse and dependence (HCC)   Community acquired pneumonia due to Pneumococcus [facility])   Anxiety state   Aspiration pneumonia of right lower lobe (HCC)   Supraglottic airway obstruction   Chronic HFrEF (heart  failure with reduced ejection fraction) (HCC)   Tracheostomy status (HCC)   Bacterial endocarditis   Pressure injury of skin   S/P percutaneous endoscopic gastrostomy (PEG) tube placement (HCC)   Plan: #Tracheostomy dependent #Hx 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 He has concerns regarding a new R anterolateral neck lesion that he noticed last night and has had pain with swallowing that is increased in severity. His last CT head/neck in April 2023 showed unchanged lobulated mass of the larynx with R neck partially necrotic mass that was slightly decreased in  size. He was due for PET scan recently however has been hospitalized and therefor has not yet had this done. -BMP to assess renal function prior to CT with contrast -CT head/neck with contrast to evaluate lesion -Routine trach care per RRT  -Guaifenesin q6h, chloraseptic spray PRN, lidocaine mouth wash PRN   #Severe protein-calorie malnutrition #PEG tube dependent Stable. -RD consulted, appreciate their assistance -Continue night tube feeding -Encourage PO intake as tolerated -Zofran PRN - be cautious with QT given on chronic methadone -Daily weights--please time these mid-morning as patient refuses during early morning hours   #Social determinants of health Patient has an apartment home offer pending [facility] inspection and approval of the home. This may take 1-3 weeks.   #Chronic hypotension Stable. -MAP goal >60 -Continue midodrine.   #Opioid use disorder #Chronic methadone use -Continue daily methadone   #Anxiety #Insomnia Stable. -Continue duloxetine, trazodone QHS, hydroxyzine 100mg  q6h PRN anxiety   Best Practice: Diet: Regular diet and tube feeds (nighttime trickle) IVF: None VTE: rivaroxaban (XARELTO) tablet 10 mg Start: 07/09/21 0929 Code: Full AB: None DISPO: Anticipated discharge pending Home enviroment access/layout.  Signature: Champ Mungo, D.O.  Internal Medicine Resident, PGY-1 [facility]  Pager: [phone] 10:53 AM, 07/24/2021   Please contact the on call pager after 5 pm and on weekends at [phone].

## 2021-07-25 ENCOUNTER — Inpatient Hospital Stay (HOSPITAL_COMMUNITY): Payer: Medicaid Other

## 2021-07-25 DIAGNOSIS — J9601 Acute respiratory failure with hypoxia: Secondary | ICD-10-CM | POA: Diagnosis not present

## 2021-07-25 NOTE — Progress Notes (Addendum)
CSW spoke with patient at bedside to discuss his housing voucher. Patient reports he has been in contact with Debbie at [facility] regarding his case. Patient provided CSW with permission to speak with Debbie directly.'  CSW spoke with Jackelyn Poling at [facility] who states the patient has been approved to receive a unit in an apartment complex. Jackelyn Poling states the renovations have been completed on the unit and she is awaiting for the unit to be inspected prior to the patient moving in.  CSW requested DO order a hospital bed for the patient.  CSW spoke with [place] at [employer] to place order for hospital bed.   Madilyn Fireman, MSW, LCSW Transitions of Care  Clinical Social Worker II [phone]

## 2021-07-25 NOTE — Progress Notes (Signed)
Modified Barium Swallow Progress Note  Patient Details  Name: David [name] MRN: [id number] Date of Birth: 1975-06-21  Today's Date: 07/25/2021  Modified Barium Swallow completed.  Full report located under Chart Review in the Imaging Section.  Brief recommendations include the following:  Clinical Impression  Patient presents with mild improvements in swallow function as compared to previous MBS on 5/26 however at that time he also had Cortrak feeding tube impacting swallow function. Main improvements noted during today's MBS are reduced pharyngeal residuals, improved consistency and effectiveness of using swallow strategies of head turn right during swallow and cough/throat clear and reswallow and decreased intensity of discomfort from penetration/aspiration. Patient independently used both strategies and this was effective to clear all observed penetrates which occured with nectar thick and thin liquid barium and cleared majority of aspirate. He did exhibit one instance of trace silent aspiration of thin liquid barium during this study. When SLP asked patient is comfort level when swallowing thin liquid barium with added requirement of swallow strategies, he candidly replied, "I've been doing it already" and that when he has a liquid "like a regular coke" he has to be more careful with his swallowing. SLP is recommending to liberalize patient's diet to allow thin liquids at this time as he is demonstrating consistent and effective use of learned swallow strategies to reduce his aspiration risk. SLP will follow briefly to ensure education complete and patient comfortable with this change.   Swallow Evaluation Recommendations       SLP Diet Recommendations: Regular solids;Thin liquid   Liquid Administration via: Cup   Medication Administration: Whole meds with puree   Supervision: Patient able to self feed   Compensations: Slow rate;Small sips/bites;Other (Comment);Clear throat after  each swallow;Hard cough after swallow (head turn right during swallows with liquids)   Postural Changes: Seated upright at 90 degrees;Remain semi-upright after after feeds/meals (Comment) (30 minutes)   Oral Care Recommendations: Oral care BID;Patient independent with oral care   Other Recommendations: Have oral suction available    Sonia Baller, MA, CCC-SLP Speech Therapy

## 2021-07-25 NOTE — Plan of Care (Signed)

## 2021-07-25 NOTE — Progress Notes (Signed)
RT NOTES: Pt refusing suction, trach care, aerosol trach collar. Pt did complain on pain at trach site under flange of trach. Was able to talk patient into placing a gauze under trach to keep the flange off his skin. Patient also allowed inner cannula change.

## 2021-07-25 NOTE — Progress Notes (Signed)
  Mobility Specialist Criteria Algorithm Info.   07/25/21 1350  Mobility  Activity Refused mobility (Asked to return later)   Will f/u as time permits.  07/25/2021 3:01 PM  [place] Demetres Prochnow, [place], [place]  BTYOM:[phone] Office: [phone]

## 2021-07-25 NOTE — Progress Notes (Signed)
HD#49 SUBJECTIVE:  Patient Summary: David [name] is a 46 y.o. 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 Hospital course has been complicated by ICU admission requiring tracheostomy, bacteremia, concern for endocarditis, and persistent hypotension. He has completed a 4 week course of IV antibiotics and is stable for discharge once a safe plan has been formed.  Overnight Events: CT head/neck obtained, see below for detailed report.  Interim History: Patient is encouraged to learn that imaging of his head and neck from yesterday does not show obvious progression of disease. He has been speaking with social work regarding his dispo situation and seems hopeful.  OBJECTIVE:  Vital Signs: Vitals:   07/24/21 2320 07/25/21 0313 07/25/21 0805 07/25/21 0807  BP:    116/82  Pulse: 92 87 97 92  Resp: '18 20 18 16  '$ Temp:    98.5 F (36.9 C)  TempSrc:    Oral  SpO2: 95%  97% 98%  Weight:      Height:       Supplemental O2:  Trach collar SpO2: 98 % O2 Flow Rate (L/min): 5 L/min FiO2 (%): 21 %  Filed Weights   07/13/21 1500 07/14/21 1200 07/23/21 0915  Weight: 37.5 kg 37.4 kg 39.2 kg     Intake/Output Summary (Last 24 hours) at 07/25/2021 5956 Last data filed at 07/24/2021 2100 Gross per 24 hour  Intake 100 ml  Output 600 ml  Net -500 ml    Net IO Since Admission: 295.27 mL [07/25/21 0811]  Physical Exam: Constitutional:Chronically ill and cachectic, in no acute distress. HENT: R anterolateral neck lesion unchanged. Subjectively smaller. Cardio:Regular rate and rhythm. Pulm:Normal work of breathing on trach collar. LOV:FIEPP all extremities spontaneously.  Skin:Warm and dry. Neuro:Alert and oriented x3. No focal deficit noted. Psych:Normal mood and affect.  Patient Lines/Drains/Airways Status     Active Line/Drains/Airways     Name  Placement date Placement time Site Days   Peripheral IV 06/07/21 20 G 1.88" Anterior;Proximal;Right Forearm 06/07/21  0807  Forearm  34   Midline Single Lumen 29/51/88 Left Basilic 10 cm 0 cm 41/66/06  3016  Basilic  18   Gastrostomy/Enterostomy Gastrostomy 18 Fr. LUQ 06/20/21  0832  LUQ  21   Incision (Closed) 06/09/21 Neck Other (Comment) 06/09/21  0327  -- 32   Incision (Closed) 06/20/21 Abdomen Other (Comment) 06/20/21  0810  -- 21   Incision - 3 Ports Abdomen 1: Right;Lateral 2: Right;Lateral;Medial 3: Umbilicus 02/05/30  3557  -- 21   Tracheostomy Other (Comment) 6 mm Uncuffed 06/09/21  0300  6 mm  32   Pressure Injury 06/16/21 Sacrum Medial Stage 2 -  Partial thickness loss of dermis presenting as a shallow open injury with a red, pink wound bed without slough. 06/16/21  2000  -- 25             ASSESSMENT/PLAN:  Assessment: Principal Problem:   Acute respiratory failure with hypoxia (HCC) Active Problems:   Laryngeal cancer (HCC)   Goals of care, counseling/discussion   Hypotension   Protein-calorie malnutrition, severe   Drug abuse and dependence ([place])   Community acquired pneumonia due to Pneumococcus [facility])   Anxiety state   Aspiration pneumonia of right lower lobe (HCC)   Supraglottic airway obstruction   Chronic HFrEF (heart failure with reduced ejection fraction) ([place])   Tracheostomy status (HCC)   Bacterial endocarditis  Pressure injury of skin   S/P percutaneous endoscopic gastrostomy (PEG) tube placement (HCC)   Plan: #Tracheostomy dependent #Hx 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 Due to complaints of increased pain and possible increased size of R anterolateral neck lesion, new CT head/neck was obtained (most recent prior 03/3021).   Results: 1. Ill-defined soft tissue density involving the right greater than left supraglottic/glottic larynx, consistent with known history of primary head and neck cancer. The primary mass is more ill-defined  and difficult to see as compared to previous, with superimposed edema now seen throughout the larynx, consistent with post treatment changes. The supraglottic airway is now largely obliterated as a result. Tracheostomy tube in appropriate position within the subglottic trachea. 2. Bilateral necrotic cervical adenopathy, with dominant 3.1 x 1.9 cm nodal conglomerate at right level II. These are likely similar in size and number as compared to previous exam, although also now demonstrate post treatment changes. 3. Diffuse soft tissue swelling and stranding within the right anterolateral neck, compatible with post radiation changes. No loculated collections. 4. Consolidative opacity within the visualized posterior right lung, consistent with pneumonia. Sequelae of aspiration pneumonitis could be considered given the patient's clinical picture and tracheostomy. 5. Emphysema (ICD10-J43.9).  Last PET scan 04/17 showed significant response to therapy compared to prior PET scan without suspicious residual activity and no evidence of distant metastatic disease. I have reached out to the oncology team for input given CT head/neck read above and they recommend continuing as planned with outpatient follow-up on discharge. -Routine trach care per RRT  -Guaifenesin q6h, chloraseptic spray PRN, lidocaine mouth wash PRN   #Severe protein-calorie malnutrition #PEG tube dependent Stable. -RD consulted, appreciate their assistance -Continue night tube feeding -Encourage PO intake as tolerated -Zofran PRN - be cautious with QT given on chronic methadone -Daily weights--please time these mid-morning as patient refuses during early morning hours   #Social determinants of health Patient has an apartment home offer pending [facility] inspection and approval of the home. This may take 1-3 weeks.   #Chronic hypotension Stable. -MAP goal >60 -Continue midodrine.   #Opioid use disorder #Chronic methadone  use -Continue daily methadone   #Anxiety #Insomnia Stable. -Continue duloxetine, trazodone QHS, hydroxyzine '100mg'$  q6h PRN anxiety   Best Practice: Diet: Regular diet and tube feeds (nighttime trickle) IVF: None VTE: rivaroxaban (XARELTO) tablet 10 mg Start: 07/09/21 0929 Code: Full AB: None DISPO: Anticipated discharge pending Home enviroment access/layout.  Signature: Farrel Gordon, D.O.  Internal Medicine Resident, PGY-1 [facility]  Pager: [phone] 8:11 AM, 07/25/2021   Please contact the on call pager after 5 pm and on weekends at [phone].

## 2021-07-26 DIAGNOSIS — J9601 Acute respiratory failure with hypoxia: Secondary | ICD-10-CM | POA: Diagnosis not present

## 2021-07-26 LAB — CBC
HCT: 36.3 % — ABNORMAL LOW (ref 39.0–52.0)
Hemoglobin: 11.9 g/dL — ABNORMAL LOW (ref 13.0–17.0)
MCH: 30 pg (ref 26.0–34.0)
MCHC: 32.8 g/dL (ref 30.0–36.0)
MCV: 91.4 fL (ref 80.0–100.0)
Platelets: 273 10*3/uL (ref 150–400)
RBC: 3.97 MIL/uL — ABNORMAL LOW (ref 4.22–5.81)
RDW: 14.8 % (ref 11.5–15.5)
WBC: 8.6 10*3/uL (ref 4.0–10.5)
nRBC: 0 % (ref 0.0–0.2)

## 2021-07-26 MED ORDER — OXYCODONE HCL 5 MG PO TABS
5.0000 mg | ORAL_TABLET | Freq: Four times a day (QID) | ORAL | Status: DC | PRN
  Administered 2021-07-26 – 2021-08-22 (×71): 5 mg via ORAL
  Filled 2021-07-26 (×75): qty 1

## 2021-07-26 MED ORDER — SODIUM CHLORIDE 0.9 % IN NEBU: 3.0000 mL | INHALATION_SOLUTION | Freq: Three times a day (TID) | RESPIRATORY_TRACT | Status: DC | PRN

## 2021-07-26 MED ORDER — DM-GUAIFENESIN ER 30-600 MG PO TB12
1.0000 | ORAL_TABLET | Freq: Two times a day (BID) | ORAL | Status: DC
  Administered 2021-07-26: 1 via ORAL
  Filled 2021-07-26 (×3): qty 1

## 2021-07-26 MED ORDER — KETOROLAC TROMETHAMINE 15 MG/ML IJ SOLN
15.0000 mg | Freq: Once | INTRAMUSCULAR | Status: AC
  Administered 2021-07-26: 15 mg via INTRAVENOUS
  Filled 2021-07-26: qty 1

## 2021-07-26 MED ORDER — KETOROLAC TROMETHAMINE 15 MG/ML IJ SOLN
30.0000 mg | Freq: Three times a day (TID) | INTRAMUSCULAR | Status: DC | PRN
  Administered 2021-07-26 – 2021-07-27 (×3): 30 mg via INTRAVENOUS
  Filled 2021-07-26 (×3): qty 2

## 2021-07-26 NOTE — Progress Notes (Addendum)
HD#50 SUBJECTIVE:  Patient Summary: David [name] is a 46 y.o. 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 Hospital course has been complicated by ICU admission requiring tracheostomy, bacteremia, concern for endocarditis, and persistent hypotension. He has completed a 4 week course of IV antibiotics and is stable for discharge once a safe plan has been formed.  Overnight Events: None  Interim History: Patient has increased pain in his R neck today particularly with swallowing and irritation around his trach site. He received toradol 15 mg IV x1 which brought his pain down from an 8.5 to more tolerable 5.   OBJECTIVE:  Vital Signs: Vitals:   07/25/21 1541 07/25/21 1950 07/26/21 0739 07/26/21 0801  BP:  104/80  115/73  Pulse: 79 80 94 86  Resp: '16  18 16  '$ Temp:  98.6 F (37 C)  98.5 F (36.9 C)  TempSrc:  Oral  Oral  SpO2: 97% 100% 99% 100%  Weight:      Height:       Supplemental O2:  Trach collar SpO2: 100 % O2 Flow Rate (L/min): 5 L/min FiO2 (%): 21 %  Filed Weights   07/13/21 1500 07/14/21 1200 07/23/21 0915  Weight: 37.5 kg 37.4 kg 39.2 kg     Intake/Output Summary (Last 24 hours) at 07/26/2021 1019 Last data filed at 07/26/2021 6546 Gross per 24 hour  Intake --  Output 1900 ml  Net -1900 ml    Net IO Since Admission: -1,604.73 mL [07/26/21 1019]  Physical Exam: Constitutional:Chronically ill and cachectic, in no acute distress. Cardio:Regular rate and rhythm. No murmurs, rubs, or gallops appreciated. Pulm:Normal work of breathing on trach collar. TKP:TWSFK all extremities spontaneously.  Skin:Warm and dry. Neuro:Alert and oriented x3. No focal deficit noted. Psych:Normal mood and affect.  Patient Lines/Drains/Airways Status     Active Line/Drains/Airways     Name Placement date Placement time Site Days   Peripheral IV  06/07/21 20 G 1.88" Anterior;Proximal;Right Forearm 06/07/21  0807  Forearm  34   Midline Single Lumen 81/27/51 Left Basilic 10 cm 0 cm 70/01/74  9449  Basilic  18   Gastrostomy/Enterostomy Gastrostomy 18 Fr. LUQ 06/20/21  0832  LUQ  21   Incision (Closed) 06/09/21 Neck Other (Comment) 06/09/21  0327  -- 32   Incision (Closed) 06/20/21 Abdomen Other (Comment) 06/20/21  0810  -- 21   Incision - 3 Ports Abdomen 1: Right;Lateral 2: Right;Lateral;Medial 3: Umbilicus 67/59/16  3846  -- 21   Tracheostomy Other (Comment) 6 mm Uncuffed 06/09/21  0300  6 mm  32   Pressure Injury 06/16/21 Sacrum Medial Stage 2 -  Partial thickness loss of dermis presenting as a shallow open injury with a red, pink wound bed without slough. 06/16/21  2000  -- 25             ASSESSMENT/PLAN:  Assessment: Principal Problem:   Acute respiratory failure with hypoxia (HCC) Active Problems:   Laryngeal cancer (HCC)   Goals of care, counseling/discussion   Hypotension   Protein-calorie malnutrition, severe   Drug abuse and dependence ([place])   Community acquired pneumonia due to Pneumococcus [facility])   Anxiety state   Aspiration pneumonia of right lower lobe (HCC)   Supraglottic airway obstruction   Chronic HFrEF (heart failure with reduced ejection fraction) ([place])   Tracheostomy status (HCC)   Bacterial endocarditis   Pressure injury of  skin   S/P percutaneous endoscopic gastrostomy (PEG) tube placement (HCC)   Plan: #Tracheostomy dependent #Hx 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 CBC without new leukocytosis, decreasing concern for acute pneumonia when combined with stable vital signs and no other infectious signs. He is reporting continued thick secretions as well as persistent irritation around his trach site and in the R side of his neck up into his ear, exacerbated by swallowing. -Replace suction tubing -Hypertonic nebulizer q8h PRN secretions -Mucinex BID PRN secretions -Routine trach care  per RRT  -Toradol 30 mg IV q8h PRN moderate pain -Chloraseptic spray PRN, lidocaine mouth wash PRN throat irritation   #Severe protein-calorie malnutrition #PEG tube dependent Stable. Progressed diet to include thin liquids 06/28. -RD consulted, appreciate their assistance -Continue night tube feeding -Encourage PO intake as tolerated -Zofran PRN - be cautious with QT given on chronic methadone -Daily weights--please time these mid-morning as patient refuses during early morning hours   #Social determinants of health Patient has an apartment home offer pending [facility] inspection and approval of the home. DME hospital bed ordered 06/28. Hopeful that in the next 1 week or so he is able to discharge to his new home.   #Chronic hypotension Stable. -MAP goal >60 -Continue midodrine.   #Opioid use disorder #Chronic methadone use -Continue daily methadone   #Anxiety #Insomnia Stable. -Continue duloxetine, trazodone QHS, hydroxyzine '100mg'$  q6h PRN anxiety   Best Practice: Diet: Regular diet and tube feeds (nighttime trickle) IVF: None VTE: rivaroxaban (XARELTO) tablet 10 mg Start: 07/09/21 0929 Code: Full AB: None DISPO: Anticipated discharge pending Home enviroment access/layout.  Signature: Farrel Gordon, D.O.  Internal Medicine Resident, PGY-1 [facility]  Pager: [phone] 10:19 AM, 07/26/2021   Please contact the on call pager after 5 pm and on weekends at [phone].

## 2021-07-26 NOTE — Progress Notes (Signed)
RT at bedside to assess patient.  Patient complaining of soreness around the trach, pain on the right side of his neck that radiates to his ear, and increased tan secretions.  Patient is requesting pain medication and wants to speak to a doctor.  RT cleaned patient's inner cannula & offered to clean around the trach but the patient has refused.  The trach site has mepliex pads on it, looks red and irritated. RT spoke to RN & discussed the patient's concerns and how the trach site looks. RT asked RN if MD could be notified and possibly consult wound care. RN will contact MD.

## 2021-07-26 NOTE — Progress Notes (Signed)
Mobility Specialist Criteria Algorithm Info.   07/26/21 1518  Pain Assessment  Pain Assessment 0-10  Pain Score 8  Pain Location Trach  Pain Descriptors / Indicators Discomfort  Pain Intervention(s) Patient requesting pain meds-RN notified  Mobility  Activity Ambulated with assistance in hallway  Range of Motion/Exercises Active;All extremities  Level of Assistance Standby assist, set-up cues, supervision of patient - no hands on  Assistive Device Four wheel walker  Distance Ambulated (ft) 120 ft  Activity Response Tolerated well   Patient received in supine agreeable to participate in mobility. Ambulated in hallway supervision level with steady gait. Returned to room without  incident. Did complain of 8/10 pain in trach area. RN present. Was left in bed with all needs met, call bell in reach.   07/26/2021 4:04 PM  Jordan Tripp, CMS, BS EXP Acute Rehabilitation Services  Phone:[phone] Office: [phone]  

## 2021-07-27 DIAGNOSIS — Z930 Tracheostomy status: Secondary | ICD-10-CM | POA: Diagnosis not present

## 2021-07-27 DIAGNOSIS — Z8521 Personal history of malignant neoplasm of larynx: Secondary | ICD-10-CM | POA: Diagnosis not present

## 2021-07-27 DIAGNOSIS — E43 Unspecified severe protein-calorie malnutrition: Secondary | ICD-10-CM | POA: Diagnosis not present

## 2021-07-27 DIAGNOSIS — J9601 Acute respiratory failure with hypoxia: Secondary | ICD-10-CM | POA: Diagnosis not present

## 2021-07-27 MED ORDER — ZINC OXIDE 12.8 % EX OINT
TOPICAL_OINTMENT | CUTANEOUS | Status: DC | PRN
  Filled 2021-07-27: qty 56.7

## 2021-07-27 MED ORDER — GUAIFENESIN 100 MG/5ML PO LIQD
15.0000 mL | Freq: Four times a day (QID) | ORAL | Status: DC | PRN
  Administered 2021-07-27 – 2021-07-31 (×9): 15 mL via ORAL
  Filled 2021-07-27 (×9): qty 15

## 2021-07-27 MED ORDER — KETOROLAC TROMETHAMINE 15 MG/ML IJ SOLN
30.0000 mg | Freq: Four times a day (QID) | INTRAMUSCULAR | Status: DC | PRN
  Administered 2021-07-27 – 2021-08-01 (×17): 30 mg via INTRAVENOUS
  Filled 2021-07-27 (×17): qty 2

## 2021-07-27 NOTE — Consult Note (Addendum)
[place] Nurse Consult Note: Patient receiving care in [facility] 2W12 Reason for Consult: Trach irritation Wound type: Irritant dermatitis at trach site Pressure Injury POA: NA Measurement: Wound bed: macerated and erythematous Drainage (amount, consistency, odor) copious secretions Dressing procedure/placement/frequency: Clean the trach irritated area with NS, then apply triple paste to the area followed by a Drawtex 4 x 4 trach dressing Kellie Simmering # [phone]) for cushion and absorption from secretions and pressure from trach collar.  ICD-10 CM Codes for Irritant Dermatitis L24B2 - Related to respiratory stoma or fistula L30.4  - Erythema intertrigo. Also used for abrasion of the hand, chafing of the skin, dermatitis due to sweating and friction, friction dermatitis, friction eczema, and genital/thigh intertrigo.   Monitor the wound area(s) for worsening of condition such as: Signs/symptoms of infection, increase in size, development of or worsening of odor, development of pain, or increased pain at the affected locations.   Notify the medical team if any of these develop.  Thank you for the consult. [place] nurse will not follow at this time.   Please re-consult the [place] team if needed.  Cathlean Marseilles Tamala Julian, MSN, RN, [place], Lysle Pearl, [facility] Wound Treatment Associate Pager [phone]

## 2021-07-27 NOTE — Progress Notes (Signed)
RT at bedside today today for trach care and education and hands on teaching with patient. Patient was given a mirror to [name] while performing trach care today.He was taught how to clean around trach site with Q-tips and sterile water.  Also how to take off and replace drain gauze under trach.  Also educated patient on the importance of using gloves and clean technique.  Patient understands and is able to change inner cannula. RT staff will continue educating and coaching patient with trach care.  Will encourage patient to participate in daily cleaning and  routine trach care.

## 2021-07-27 NOTE — Progress Notes (Signed)
HD#51 SUBJECTIVE:  Patient Summary: David [name] is a 46 y.o. 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 Hospital course has been complicated by ICU admission requiring tracheostomy, bacteremia, concern for endocarditis, and persistent hypotension. He has completed a 4 week course of IV antibiotics and is stable for discharge once a safe plan has been formed.  Overnight Events: None  Interim History: Patient reports feeling okay today. He states that Toradol is helping with his pain well but that it doesn't last a full 8 hours. He is hopeful that he will be able to discharge in the upcoming week and has received phone calls about having equipment delivered to his new apartment, which he will continue speaking with social work about.  OBJECTIVE:  Vital Signs: Vitals:   07/26/21 2141 07/27/21 0051 07/27/21 0811 07/27/21 0859  BP: 133/74  121/79   Pulse: 80  85 78  Resp: '16 18 16 16  '$ Temp: 98 F (36.7 C)  98.2 F (36.8 C)   TempSrc: Oral  Oral   SpO2: 98%  100% 100%  Weight:      Height:       Supplemental O2:  Trach collar SpO2: 100 % O2 Flow Rate (L/min): 5 L/min FiO2 (%): 21 %  Filed Weights   07/13/21 1500 07/14/21 1200 07/23/21 0915  Weight: 37.5 kg 37.4 kg 39.2 kg    No intake or output data in the 24 hours ending 07/27/21 1130  Net IO Since Admission: -1,604.73 mL [07/27/21 1130]  Physical Exam: Constitutional:Chronically ill and cachectic, in no acute distress. Cardio:Regular rate and rhythm. No murmurs, rubs, or gallops appreciated. Pulm:Normal work of breathing on trach collar. LYY:TKPTW all extremities spontaneously.  Skin:Warm and dry. Neuro:Alert and oriented x3. No focal deficit noted. Psych:Normal mood and affect.  Patient Lines/Drains/Airways Status     Active Line/Drains/Airways     Name Placement date Placement  time Site Days   Peripheral IV 06/07/21 20 G 1.88" Anterior;Proximal;Right Forearm 06/07/21  0807  Forearm  34   Midline Single Lumen 65/68/12 Left Basilic 10 cm 0 cm 75/17/00  1749  Basilic  18   Gastrostomy/Enterostomy Gastrostomy 18 Fr. LUQ 06/20/21  0832  LUQ  21   Incision (Closed) 06/09/21 Neck Other (Comment) 06/09/21  0327  -- 32   Incision (Closed) 06/20/21 Abdomen Other (Comment) 06/20/21  0810  -- 21   Incision - 3 Ports Abdomen 1: Right;Lateral 2: Right;Lateral;Medial 3: Umbilicus 44/96/75  9163  -- 21   Tracheostomy Other (Comment) 6 mm Uncuffed 06/09/21  0300  6 mm  32   Pressure Injury 06/16/21 Sacrum Medial Stage 2 -  Partial thickness loss of dermis presenting as a shallow open injury with a red, pink wound bed without slough. 06/16/21  2000  -- 25             ASSESSMENT/PLAN:  Assessment: Principal Problem:   Acute respiratory failure with hypoxia (HCC) Active Problems:   Laryngeal cancer (HCC)   Goals of care, counseling/discussion   Hypotension   Protein-calorie malnutrition, severe   Drug abuse and dependence ([place])   Community acquired pneumonia due to Pneumococcus [facility])   Anxiety state   Aspiration pneumonia of right lower lobe (HCC)   Supraglottic airway obstruction   Chronic HFrEF (heart failure with reduced ejection fraction) (HCC)   Tracheostomy status (HCC)   Bacterial endocarditis   Pressure  injury of skin   S/P percutaneous endoscopic gastrostomy (PEG) tube placement ([place])   Plan: #Tracheostomy dependent #Hx 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 Toradol for pain was added 06/29 and has been helpful for his pain though is not lasting the full 8 hours. -Hypertonic nebulizer q8h PRN secretions -Robitussin q6h PRN secretions -Routine trach care per RRT  -Toradol 30 mg IV q6h PRN moderate pain -Chloraseptic spray PRN, lidocaine mouth wash PRN throat irritation   #Severe protein-calorie malnutrition #PEG tube dependent Stable.  Progressed diet to include thin liquids 06/28. -RD consulted, appreciate their assistance -Continue night tube feeding -Encourage PO intake as tolerated -Zofran PRN - be cautious with QT given on chronic methadone -Daily weights--please time these mid-morning as patient refuses during early morning hours   #Social determinants of health Patient has an apartment home offer pending [facility] inspection and approval of the home. DME hospital bed ordered 06/28. Hopeful that in the next 1 week or so he is able to discharge to his new home.   #Chronic hypotension Stable. -MAP goal >60 -Continue midodrine.   #Opioid use disorder #Chronic methadone use -Continue daily methadone   #Anxiety #Insomnia Stable. -Continue duloxetine, trazodone QHS, hydroxyzine '100mg'$  q6h PRN anxiety   Best Practice: Diet: Regular diet and tube feeds (nighttime trickle) IVF: None VTE: rivaroxaban (XARELTO) tablet 10 mg Start: 07/09/21 0929 Code: Full AB: None DISPO: Anticipated discharge pending Home enviroment access/layout.  Signature: Farrel Gordon, D.O.  Internal Medicine Resident, PGY-1 [facility]  Pager: [phone] 11:30 AM, 07/27/2021   Please contact the on call pager after 5 pm and on weekends at [phone].

## 2021-07-27 NOTE — Progress Notes (Signed)
Mobility Specialist Progress Note:   07/27/21 1700  Mobility  Activity Ambulated with assistance in hallway  Level of Assistance Standby assist, set-up cues, supervision of patient - no hands on  Assistive Device Four wheel walker  Distance Ambulated (ft) 100 ft  Activity Response Tolerated well  $Mobility charge 1 Mobility   Pt agreeable to mobility session. Ambulated at supervision level with a rollator. Pt c/o trach pain, and that pain medicine is not working long enough. Pt left in bed with all needs met, RN notified of pain.   Nelta Numbers Acute Rehab Secure Chat or Office Phone: [phone]

## 2021-07-28 NOTE — Plan of Care (Signed)

## 2021-07-28 NOTE — Plan of Care (Signed)
  Problem: Activity: Goal: Ability to tolerate increased activity will improve 07/28/2021 1303 by Signa Kell, RN Outcome: Progressing 07/28/2021 1303 by Signa Kell, RN Outcome: Progressing   Problem: Clinical Measurements: Goal: Ability to maintain a body temperature in the normal range will improve 07/28/2021 1303 by Signa Kell, RN Outcome: Progressing 07/28/2021 1303 by Signa Kell, RN Outcome: Progressing   Problem: Respiratory: Goal: Ability to maintain adequate ventilation will improve 07/28/2021 1303 by Signa Kell, RN Outcome: Progressing 07/28/2021 1303 by Signa Kell, RN Outcome: Progressing Goal: Ability to maintain a clear airway will improve 07/28/2021 1303 by Signa Kell, RN Outcome: Progressing 07/28/2021 1303 by Signa Kell, RN Outcome: Progressing   Problem: Education: Goal: Knowledge of General Education information will improve Description: Including pain rating scale, medication(s)/side effects and non-pharmacologic comfort measures 07/28/2021 1303 by Signa Kell, RN Outcome: Progressing 07/28/2021 1303 by Signa Kell, RN Outcome: Progressing   Problem: Health Behavior/Discharge Planning: Goal: Ability to manage health-related needs will improve Outcome: Progressing   Problem: Clinical Measurements: Goal: Ability to maintain clinical measurements within normal limits will improve 07/28/2021 1303 by Signa Kell, RN Outcome: Progressing 07/28/2021 1303 by Signa Kell, RN Outcome: Progressing Goal: Will remain free from infection 07/28/2021 1303 by Signa Kell, RN Outcome: Progressing 07/28/2021 1303 by Signa Kell, RN Outcome: Progressing Goal: Diagnostic test results will improve Outcome: Progressing Goal: Respiratory complications will improve 07/28/2021 1303 by Signa Kell, RN Outcome: Progressing 07/28/2021 1303 by Signa Kell, RN Outcome: Progressing Goal: Cardiovascular complication will be avoided Outcome:  Progressing   Problem: Activity: Goal: Risk for activity intolerance will decrease Outcome: Progressing   Problem: Nutrition: Goal: Adequate nutrition will be maintained Outcome: Progressing   Problem: Coping: Goal: Level of anxiety will decrease Outcome: Progressing   Problem: Elimination: Goal: Will not experience complications related to bowel motility Outcome: Progressing Goal: Will not experience complications related to urinary retention Outcome: Progressing

## 2021-07-28 NOTE — Progress Notes (Signed)
Subjective:   Hospital day:52  Overnight event: Walked 100 ft with mobility specialist. No acute events overnight  Interim History: David [name] was evaluated at the bedside laying comfortably in room with nurse in the room.  He has no acute concerns except his suction was not strong enough.  States he is hopeful about get an apartment in the next 1 to 2 weeks.  Objective:  Vital signs in last 24 hours: Vitals:   07/27/21 1900 07/27/21 1923 07/28/21 0304 07/28/21 0806  BP: 131/84     Pulse:  80 85 88  Resp: '16 20 20 18  '$ Temp: 98.3 F (36.8 C)     TempSrc: Oral     SpO2: 100% 98% 98% 100%  Weight:      Height:        Filed Weights   07/13/21 1500 07/14/21 1200 07/23/21 0915  Weight: 37.5 kg 37.4 kg 39.2 kg     Intake/Output Summary (Last 24 hours) at 07/28/2021 0812 Last data filed at 07/28/2021 0535 Gross per 24 hour  Intake --  Output 1325 ml  Net -1325 ml   Net IO Since Admission: -2,929.73 mL [07/28/21 0812]  No results for input(s): "GLUCAP" in the last 72 hours.   Pertinent Labs:    Latest Ref Rng & Units 07/26/2021    7:50 AM 07/15/2021    3:14 AM 07/08/2021    3:45 AM  CBC  WBC 4.0 - 10.5 K/uL 8.6  5.8  6.3   Hemoglobin 13.0 - 17.0 g/dL 11.9  9.6  9.7   Hematocrit 39.0 - 52.0 % 36.3  29.1  29.8   Platelets 150 - 400 K/uL 273  224  232        Latest Ref Rng & Units 07/24/2021   12:08 PM 07/01/2021    1:48 AM 06/25/2021    9:54 AM  CMP  Glucose 70 - 99 mg/dL 117  101  87   BUN 6 - 20 mg/dL '21  17  13   '$ Creatinine 0.61 - 1.24 mg/dL 0.57  0.43  0.41   Sodium 135 - 145 mmol/L 134  134  131   Potassium 3.5 - 5.1 mmol/L 4.4  4.3  4.2   Chloride 98 - 111 mmol/L 96  98  94   CO2 22 - 32 mmol/L 29  31  32   Calcium 8.9 - 10.3 mg/dL 9.4  8.8  8.5     Imaging: No results found.  Physical Exam  General: Pleasant, chronically ill and cachectic middle-age man.  No acute distress. HEENT: Trach collar stable in place. CV: RRR. No m/r/g. No LE edema Pulmonary:  Normal work of breathing on trach collar. Abdomen: Soft and nontender.  PEG tube stable in place. Extremities: Radial and DP pulses 2+ and symmetric Skin: Warm and dry. No obvious rash or lesions. Neuro: A&Ox3. Moves all extremities. Normal sensation to gross touch.  Psych: Normal mood and affect   Assessment/Plan: David [name] is a 46 y.o. male with hx of Christy Friede is a 46 y.o. 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 Hospital course has been complicated by ICU admission requiring tracheostomy, bacteremia, concern for endocarditis, and persistent hypotension. He has completed a 4 week course of IV antibiotics and is stable for discharge once a safe plan has been formed.  Principal Problem:   Acute respiratory failure with  hypoxia ([place]) Active Problems:   Laryngeal cancer (HCC)   Goals of care, counseling/discussion   Hypotension   Protein-calorie malnutrition, severe   Drug abuse and dependence ([place])   Community acquired pneumonia due to Pneumococcus [facility])   Anxiety state   Aspiration pneumonia of right lower lobe (HCC)   Supraglottic airway obstruction   Chronic HFrEF (heart failure with reduced ejection fraction) (HCC)   Tracheostomy status (HCC)   Bacterial endocarditis   Pressure injury of skin   S/P percutaneous endoscopic gastrostomy (PEG) tube placement ([place])  #Tracheostomy dependent #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  Continues to have as needed Toradol for pain.  He was able to walk 100 ft with mobility specialist yesterday.  He is hopeful about leaving the hospital in the next 1 to 2 weeks. -Hypertonic nebulizer q8h PRN secretions -Robitussin q6h PRN secretions -Routine trach care per RRT  -Toradol 30 mg IV q6h PRN moderate pain -Chloraseptic spray PRN, lidocaine mouth wash PRN  throat irritation   #Severe protein-calorie malnutrition #PEG tube dependent Weight is up 6 pounds in the last 3 weeks but overall down 7 pounds since admission. Progressed diet to include thin liquids 06/28. -RD consulted, appreciate their assistance -Continue night tube feeding -Encourage PO intake as tolerated -Zofran PRN - be cautious with QT given on chronic methadone -Daily weights--please time these mid-morning as patient refuses during early morning hours   #Social determinants of health Patient has an apartment home offer pending [facility] inspection and approval of the home. DME hospital bed ordered 06/28. Hopeful that in the next 1 week or so he is able to discharge to his new home.   #Chronic hypotension Significantly improved since admission. SBP in the 110s to 130s. -MAP goal >60 -Continue midodrine.   #Opioid use disorder #Chronic methadone use -Continue daily methadone   #Anxiety #Insomnia Stable. -Continue duloxetine, trazodone QHS, hydroxyzine '100mg'$  q6h PRN anxiety   Best Practice: Diet: Regular diet and tube feeds (nighttime trickle) IVF: None VTE: rivaroxaban (XARELTO) tablet 10 mg Start: 07/09/21 0929 Code: Full AB: None DISPO: Anticipated discharge pending Home enviroment access/layout.  Signed: Lacinda Axon, MD 07/28/2021, 8:12 AM  Pager: [phone] Internal Medicine Teaching Service After 5pm on weekdays and 1pm on weekends: On Call pager: [phone]

## 2021-07-29 DIAGNOSIS — I959 Hypotension, unspecified: Secondary | ICD-10-CM

## 2021-07-29 DIAGNOSIS — Z8521 Personal history of malignant neoplasm of larynx: Secondary | ICD-10-CM | POA: Diagnosis not present

## 2021-07-29 DIAGNOSIS — J9601 Acute respiratory failure with hypoxia: Secondary | ICD-10-CM | POA: Diagnosis not present

## 2021-07-29 DIAGNOSIS — E43 Unspecified severe protein-calorie malnutrition: Secondary | ICD-10-CM | POA: Diagnosis not present

## 2021-07-29 MED ORDER — MIDODRINE HCL 5 MG PO TABS
5.0000 mg | ORAL_TABLET | Freq: Three times a day (TID) | ORAL | Status: DC
  Administered 2021-07-30 – 2021-07-31 (×4): 5 mg via ORAL
  Filled 2021-07-29 (×4): qty 1

## 2021-07-29 NOTE — Progress Notes (Signed)
Subjective:   Hospital day: 53  Overnight event: No acute events overnight  Interim History: Patient was evaluated at the bedside laying comfortably in bed watching TV.  He reports some burning sensation via his IV line when his Toradol was given however, this improved after the mixed it with saline. Pt also had questions about when his housing will be available and when ENT plans to come back to see him. All patient's questions were addressed.  Objective:  Vital signs in last 24 hours: Vitals:   07/28/21 1910 07/28/21 1929 07/28/21 2320 07/29/21 0345  BP: (!) 150/95     Pulse: 71 85 71 82  Resp: '20  20 20  '$ Temp: (!) 97.5 F (36.4 C)     TempSrc: Oral     SpO2: 100% 99% 98% 94%  Weight:      Height:        Filed Weights   07/13/21 1500 07/14/21 1200 07/23/21 0915  Weight: 37.5 kg 37.4 kg 39.2 kg     Intake/Output Summary (Last 24 hours) at 07/29/2021 3976 Last data filed at 07/29/2021 0300 Gross per 24 hour  Intake --  Output 1100 ml  Net -1100 ml   Net IO Since Admission: -4,029.73 mL [07/29/21 0624]  No results for input(s): "GLUCAP" in the last 72 hours.   Pertinent Labs:    Latest Ref Rng & Units 07/26/2021    7:50 AM 07/15/2021    3:14 AM 07/08/2021    3:45 AM  CBC  WBC 4.0 - 10.5 K/uL 8.6  5.8  6.3   Hemoglobin 13.0 - 17.0 g/dL 11.9  9.6  9.7   Hematocrit 39.0 - 52.0 % 36.3  29.1  29.8   Platelets 150 - 400 K/uL 273  224  232        Latest Ref Rng & Units 07/24/2021   12:08 PM 07/01/2021    1:48 AM 06/25/2021    9:54 AM  CMP  Glucose 70 - 99 mg/dL 117  101  87   BUN 6 - 20 mg/dL '21  17  13   '$ Creatinine 0.61 - 1.24 mg/dL 0.57  0.43  0.41   Sodium 135 - 145 mmol/L 134  134  131   Potassium 3.5 - 5.1 mmol/L 4.4  4.3  4.2   Chloride 98 - 111 mmol/L 96  98  94   CO2 22 - 32 mmol/L 29  31  32   Calcium 8.9 - 10.3 mg/dL 9.4  8.8  8.5     Imaging: No results found.  Physical Exam  General: Pleasant, chronically ill and cachectic middle-age man. No  acute distress. HEENT: Trach collar stable in place. CV: RRR. No m/r/g. No LE edema Pulmonary: Normal work of breathing on trach collar. Abdomen: Soft and nontender. PEG tube stable in place. Extremities: Radial and DP pulses 2+ and symmetric Skin: Warm and dry. No obvious rash or lesions. Neuro: A&Ox3. Moves all extremities. Normal sensation to gross touch.  Psych: Normal mood and affect   Assessment/Plan: David [name] is a 46 y.o. male with hx of David [name] is a 46 y.o. 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 Hospital course has been complicated by ICU admission requiring tracheostomy, bacteremia, concern for endocarditis, and persistent hypotension. He has completed a 4 week course of IV antibiotics and is stable for discharge once a safe  plan has been formed.  Principal Problem:   Acute respiratory failure with hypoxia (HCC) Active Problems:   Laryngeal cancer (HCC)   Goals of care, counseling/discussion   Hypotension   Protein-calorie malnutrition, severe   Drug abuse and dependence ([place])   Community acquired pneumonia due to Pneumococcus [name])   Anxiety state   Aspiration pneumonia of right lower lobe (HCC)   Supraglottic airway obstruction   Chronic HFrEF (heart failure with reduced ejection fraction) ([place])   Tracheostomy status (HCC)   Bacterial endocarditis   Pressure injury of skin   S/P percutaneous endoscopic gastrostomy (PEG) tube placement ([place])  #Tracheostomy dependent #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 Continues to have ongoing trach care. Endorses some burning with administration of Toradol yesterday but this improved after the medication was mixed with saline. Still waiting for his apartment to be available. -Hypertonic nebulizer q8h PRN  secretions -Robitussin q6h PRN secretions -Routine trach care per RRT  -Toradol 30 mg IV q6h PRN moderate pain -Chloraseptic spray PRN, lidocaine mouth wash PRN throat irritation   #Severe protein-calorie malnutrition #PEG tube dependent Patient has not been weighed since 6/26. Weight is up 6 pounds in the last 3 weeks but overall down 7 pounds since admission. Progressed diet to include thin liquids 06/28.  Patient has been tolerating p.o. intakes very well. -RD consulted, appreciate their assistance -Continue night tube feeding -Encourage PO intake as tolerated -Zofran PRN - be cautious with QT given on chronic methadone -Daily weights--please time these mid-morning as patient refuses during early morning hours   #Social determinants of health Patient has an apartment home offer pending [facility] inspection and approval of the home. DME hospital bed ordered 06/28. Hopeful that in the next 1 week or so he is able to discharge to his new home.   #Chronic hypotension Significantly improved since admission. Blood pressure has been trending up the past few weeks. SBP persistently in the 110s to 130s the past week with 1 rbP reading of 150/95. Plan to decrease midodrine dose and monitor blood pressure closely. -Decrease midodrine from 10 to 5 mg TID -MAP goal >60  #Opioid use disorder #Chronic methadone use -Continue daily methadone   #Anxiety #Insomnia Stable. -Continue duloxetine, trazodone QHS, hydroxyzine '100mg'$  q6h PRN anxiety   Best Practice: Diet: Regular diet and tube feeds (nighttime trickle) IVF: None VTE: rivaroxaban (XARELTO) tablet 10 mg Start: 07/09/21 0929 Code: Full AB: None DISPO: Anticipated discharge pending Home enviroment access/layout.  Signed: Lacinda Axon, MD 07/29/2021, 6:24 AM  Pager: [phone] Internal Medicine Teaching Service After 5pm on weekdays and 1pm on weekends: On Call pager: [phone]

## 2021-07-30 DIAGNOSIS — E43 Unspecified severe protein-calorie malnutrition: Secondary | ICD-10-CM | POA: Diagnosis not present

## 2021-07-30 DIAGNOSIS — Z8521 Personal history of malignant neoplasm of larynx: Secondary | ICD-10-CM | POA: Diagnosis not present

## 2021-07-30 DIAGNOSIS — F419 Anxiety disorder, unspecified: Secondary | ICD-10-CM

## 2021-07-30 DIAGNOSIS — Z930 Tracheostomy status: Secondary | ICD-10-CM | POA: Diagnosis not present

## 2021-07-30 DIAGNOSIS — F1120 Opioid dependence, uncomplicated: Secondary | ICD-10-CM

## 2021-07-30 DIAGNOSIS — I9589 Other hypotension: Secondary | ICD-10-CM

## 2021-07-30 DIAGNOSIS — J9601 Acute respiratory failure with hypoxia: Secondary | ICD-10-CM | POA: Diagnosis not present

## 2021-07-30 NOTE — Progress Notes (Signed)
Nutrition Follow-up  DOCUMENTATION CODES:   Severe malnutrition in context of chronic illness, Underweight  INTERVENTION:   Continue TF via PEG: Osmolite 1.5 @ 85 mL/hr x 12 hours (1020 mL/day) 45 mL ProSource TF - TID 125 mL free water flush q6h Provides 1650 kcal, 97 gm of protein, and 1277 mL free water (TF + flush) daily. Continue Multivitamin w/ minerals daily  NUTRITION DIAGNOSIS:   Severe Malnutrition related to chronic illness (head and neck cancer) as evidenced by severe muscle depletion, severe fat depletion, percent weight loss (23% weight loss within 3 months). - Progressing, being addressed via TF  GOAL:   Patient will meet greater than or equal to 90% of their needs - Being met via TF  MONITOR:   PO intake, Labs, Weight trends, TF tolerance  REASON FOR ASSESSMENT:   Consult Assessment of nutrition requirement/status, Enteral/tube feeding initiation and management  ASSESSMENT:   46 yo male admitted with respiratory failure, suspected aspiration PNA. 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  5/12 - Cortrak placed (tip post pyloric) 5/13 - TF started; Trach placed 5/15 - diet advanced to Dysphagia 3, Honey thick liquids 5/22 - attempted placement of PEG in IR (not placed 2/2 anatomy) 5/23 - Cortrak removed 5/24 - PEG placed 5/26 - diet advanced to regular, Honey Thick liquids 5/30 - transitioned to bolus feeds 6/13 - transitioned to nocturnal feeds 6/21 - trach downsized to #4 6/28 - MBS w/ SLP, advanced to thin liquids   Pt resting in bed at time of RD visit. Reports that he is doing well, eating ok. Still having issues with meals coming up correctly from the kitchen. Denies any difficulty drinking thin liquids since the upgraded. Denies any nausea, vomiting, or discomfort from overnight TF.  Per EMR, pt PO intake 10-75%.  Weight up 1.1 kg since last  weight.  Discussed case with RN. Pt getting up and moving more.  Discussed case with MD team.   Medications reviewed and include: Folic acid, MVI, Protonix, Miralax,Senokot, Thiamine Labs reviewed.  Diet Order:   Diet Order             Diet regular Room service appropriate? Yes; Fluid consistency: Thin  Diet effective now                   EDUCATION NEEDS:   No education needs have been identified at this time  Skin:  Skin Assessment: Skin Integrity Issues: Skin Integrity Issues:: Stage II Stage II: Sacrum  Last BM:  7/2  Height:   Ht Readings from Last 1 Encounters:  06/07/21 5' 11" (1.803 m)    Weight:   Wt Readings from Last 1 Encounters:  07/30/21 40.3 kg    Ideal Body Weight:  78.2 kg  BMI:  Body mass index is 12.4 kg/m.  Estimated Nutritional Needs:   Kcal:  1500-1700  Protein:  65-75 gm  Fluid:  1.5-1.7 L    Makayla Mitchell RD, LDN Clinical Dietitian See AMiON for contact information.  

## 2021-07-30 NOTE — Progress Notes (Signed)
   Subjective: No acute complaints today. Pain around trach is currently well managed.  Objective:  Vital signs in last 24 hours: Vitals:   07/29/21 2044 07/29/21 2114 07/29/21 2358 07/30/21 0416  BP: (!) 122/96     Pulse: 87 89 90 95  Resp: '18 20 20 19  '$ Temp: 98.4 F (36.9 C)     TempSrc: Oral     SpO2: 98% 98% 98% 97%  Weight:      Height:       Physical exam General: Awake and in no apparent distress HEENT: Trach in place Cardiovascular: RRR Pulmonary: No increased work of breathing noted Extremities: R radial pulse 2+ Neuro: Alert with normal speech Psych: Normal mood and affect  Assessment/Plan: David [name] is a 46 yo M with history of stage IV SCC of larynx s/p chemoradiation, malnutrition, and 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 He was admitted for sepsis and acute hypoxic respiratory failure. He is now stable for discharge pending housing availability.  Principal Problem:   Acute respiratory failure with hypoxia (HCC) Active Problems:   Laryngeal cancer (HCC)   Goals of care, counseling/discussion   Hypotension   Protein-calorie malnutrition, severe   Drug abuse and dependence ([place])   Community acquired pneumonia due to Pneumococcus [facility])   Anxiety state   Aspiration pneumonia of right lower lobe (HCC)   Supraglottic airway obstruction   Chronic HFrEF (heart failure with reduced ejection fraction) (HCC)   Tracheostomy status (HCC)   Bacterial endocarditis   Pressure injury of skin   S/P percutaneous endoscopic gastrostomy (PEG) tube placement ([place])  Tracheostomy dependent History of 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 Discomfort around trach was an issue but is under good control right now. -Continue good trach care -Hypertonic nebulizer q8h PRN for secretions -Guaifenesin 15 mL of 100 mg/5 mL q6h PRN for secretions -Toradol 30 mg IV q6h PRN for moderate pain  Severe protein-calorie malnutrition PEG tube dependent Weight is up a couple of pounds in  last week. Doing well with thin liquids -RD following -Continue nightly tube feeds -Encourage PO intake -Zofran 4 mg IV q6h PRN for nausea and vomiting -Daily weights  Social determinants of health Currently awaiting apartment home. [place] inspection and approval.  Chronic hypotension Improved since admission -Continue midodrine 5 mg PO TID with meals for orthostatic hypotension  Opioid use disorder on methadone Methadone 50 mg PO daily  Anxiety and insomnia -Duloxetine 60 mg PO daily -Hydroxyzine 100 mg PO q6h PRN for anxiety -Melatonin 3 mg PO daily at bedtime -Trazodone 100 mg PO daily at bedtime  Anticipated Discharge Location: home Barriers to Discharge: housing availability Dispo: Anticipated discharge in approximately 2 day(s).   [name], MD 07/30/2021, 6:17 AM Pager: [phone] After 5pm on weekdays and 1pm on weekends: On Call pager [phone]

## 2021-07-31 DIAGNOSIS — G4700 Insomnia, unspecified: Secondary | ICD-10-CM

## 2021-07-31 DIAGNOSIS — E43 Unspecified severe protein-calorie malnutrition: Secondary | ICD-10-CM | POA: Diagnosis not present

## 2021-07-31 DIAGNOSIS — Z8521 Personal history of malignant neoplasm of larynx: Secondary | ICD-10-CM | POA: Diagnosis not present

## 2021-07-31 DIAGNOSIS — Z931 Gastrostomy status: Secondary | ICD-10-CM

## 2021-07-31 DIAGNOSIS — J9601 Acute respiratory failure with hypoxia: Secondary | ICD-10-CM | POA: Diagnosis not present

## 2021-07-31 DIAGNOSIS — Z930 Tracheostomy status: Secondary | ICD-10-CM | POA: Diagnosis not present

## 2021-07-31 MED ORDER — ALUM & MAG HYDROXIDE-SIMETH 200-200-20 MG/5ML PO SUSP
15.0000 mL | Freq: Four times a day (QID) | ORAL | Status: AC | PRN
  Administered 2021-08-07: 15 mL
  Filled 2021-07-31: qty 30

## 2021-07-31 MED ORDER — ADULT MULTIVITAMIN W/MINERALS CH
1.0000 | ORAL_TABLET | Freq: Every day | ORAL | Status: DC
  Administered 2021-07-31 – 2021-08-22 (×22): 1
  Filled 2021-07-31 (×21): qty 1

## 2021-07-31 MED ORDER — MIDODRINE HCL 5 MG PO TABS
5.0000 mg | ORAL_TABLET | Freq: Three times a day (TID) | ORAL | Status: DC
Start: 2021-07-31 — End: 2021-08-07
  Administered 2021-07-31 – 2021-08-07 (×21): 5 mg
  Filled 2021-07-31 (×20): qty 1

## 2021-07-31 MED ORDER — METHADONE HCL 10 MG PO TABS
50.0000 mg | ORAL_TABLET | Freq: Every day | ORAL | Status: DC
  Administered 2021-08-01 – 2021-08-16 (×16): 50 mg
  Filled 2021-07-31 (×17): qty 5

## 2021-07-31 MED ORDER — HYDROXYZINE HCL 25 MG PO TABS
100.0000 mg | ORAL_TABLET | Freq: Four times a day (QID) | ORAL | Status: DC | PRN
Start: 2021-07-31 — End: 2021-08-23
  Administered 2021-07-31 – 2021-08-22 (×30): 100 mg
  Filled 2021-07-31 (×34): qty 4

## 2021-07-31 MED ORDER — RIVAROXABAN 10 MG PO TABS
10.0000 mg | ORAL_TABLET | Freq: Every day | ORAL | Status: DC
  Administered 2021-07-31 – 2021-08-22 (×22): 10 mg
  Filled 2021-07-31 (×23): qty 1

## 2021-07-31 MED ORDER — THIAMINE HCL 100 MG PO TABS
100.0000 mg | ORAL_TABLET | Freq: Every day | ORAL | Status: DC
Start: 2021-07-31 — End: 2021-08-23
  Administered 2021-07-31 – 2021-08-22 (×22): 100 mg
  Filled 2021-07-31 (×22): qty 1

## 2021-07-31 MED ORDER — SENNOSIDES-DOCUSATE SODIUM 8.6-50 MG PO TABS
1.0000 | ORAL_TABLET | Freq: Two times a day (BID) | ORAL | Status: DC
  Administered 2021-07-31 – 2021-08-05 (×11): 1
  Filled 2021-07-31 (×11): qty 1

## 2021-07-31 MED ORDER — TRAZODONE HCL 50 MG PO TABS
100.0000 mg | ORAL_TABLET | Freq: Every day | ORAL | Status: DC
  Administered 2021-07-31 – 2021-08-21 (×22): 100 mg
  Filled 2021-07-31 (×26): qty 2

## 2021-07-31 MED ORDER — MELATONIN 3 MG PO TABS
3.0000 mg | ORAL_TABLET | Freq: Every evening | ORAL | Status: DC | PRN
  Administered 2021-08-02 – 2021-08-17 (×6): 3 mg
  Filled 2021-07-31 (×9): qty 1

## 2021-07-31 MED ORDER — PANTOPRAZOLE 2 MG/ML SUSPENSION
40.0000 mg | Freq: Every day | ORAL | Status: DC
  Administered 2021-07-31 – 2021-08-22 (×22): 40 mg
  Filled 2021-07-31 (×22): qty 20

## 2021-07-31 MED ORDER — GUAIFENESIN 100 MG/5ML PO LIQD
15.0000 mL | Freq: Four times a day (QID) | ORAL | Status: DC | PRN
  Administered 2021-07-31 – 2021-08-22 (×32): 15 mL
  Filled 2021-07-31 (×36): qty 15

## 2021-07-31 MED ORDER — FOLIC ACID 1 MG PO TABS
1.0000 mg | ORAL_TABLET | Freq: Every day | ORAL | Status: DC
  Administered 2021-07-31 – 2021-08-22 (×22): 1 mg
  Filled 2021-07-31 (×22): qty 1

## 2021-07-31 MED ORDER — ACETAMINOPHEN 500 MG PO TABS
1000.0000 mg | ORAL_TABLET | Freq: Three times a day (TID) | ORAL | Status: DC
Start: 2021-07-31 — End: 2021-08-05
  Administered 2021-07-31 – 2021-08-05 (×15): 1000 mg
  Filled 2021-07-31 (×17): qty 2

## 2021-07-31 NOTE — Progress Notes (Signed)
Chart accessed to assist primary RN with evening medication administration

## 2021-07-31 NOTE — Progress Notes (Signed)
Subjective:   Hospital day: 55  Overnight event: No acute events overnight.  Patient was moved to 5MW due to renovations on 2W.   Interim History: Patient evaluated at the bedside laying comfortably in bed. He is doing well overall. He reports that he notices some shortness of breath when he stands up and his head is upright but denies any other symptoms. Discussed with that this could be his deconditioning and encouraged him to get up with PT more often.   Objective:  Vital signs in last 24 hours: Vitals:   07/30/21 1104 07/30/21 1540 07/30/21 2041 07/31/21 0508  BP:   140/87 (!) 102/47  Pulse: 94 84 91 90  Resp: '18 15 18 20  '$ Temp:   98.5 F (36.9 C) 98.6 F (37 C)  TempSrc:      SpO2: 97% 95% 97% 96%  Weight:      Height:        Filed Weights   07/14/21 1200 07/23/21 0915 07/30/21 1000  Weight: 37.4 kg 39.2 kg 40.3 kg     Intake/Output Summary (Last 24 hours) at 07/31/2021 0716 Last data filed at 07/31/2021 0600 Gross per 24 hour  Intake 18855.33 ml  Output 1350 ml  Net 17505.33 ml   Net IO Since Admission: 13,060.6 mL [07/31/21 0716]  No results for input(s): "GLUCAP" in the last 72 hours.   Pertinent Labs:    Latest Ref Rng & Units 07/26/2021    7:50 AM 07/15/2021    3:14 AM 07/08/2021    3:45 AM  CBC  WBC 4.0 - 10.5 K/uL 8.6  5.8  6.3   Hemoglobin 13.0 - 17.0 g/dL 11.9  9.6  9.7   Hematocrit 39.0 - 52.0 % 36.3  29.1  29.8   Platelets 150 - 400 K/uL 273  224  232        Latest Ref Rng & Units 07/24/2021   12:08 PM 07/01/2021    1:48 AM 06/25/2021    9:54 AM  CMP  Glucose 70 - 99 mg/dL 117  101  87   BUN 6 - 20 mg/dL '21  17  13   '$ Creatinine 0.61 - 1.24 mg/dL 0.57  0.43  0.41   Sodium 135 - 145 mmol/L 134  134  131   Potassium 3.5 - 5.1 mmol/L 4.4  4.3  4.2   Chloride 98 - 111 mmol/L 96  98  94   CO2 22 - 32 mmol/L 29  31  32   Calcium 8.9 - 10.3 mg/dL 9.4  8.8  8.5     Imaging: No results found.  Physical Exam  General: Pleasant, chronically ill  and cachectic middle-age man. No acute distress. HEENT: Trach collar stable in place. CV: RRR. No m/r/g. No LE edema Pulmonary: Normal work of breathing on trach collar.  Lungs CTAB. Decreased air movement.  No wheezing or rales. Abdomen: PEG tube stable in place. Neuro: A&Ox3. Moves all extremities. Psych: Normal mood and affect   Assessment/Plan: David [name] is a 46 y.o. male with hx of David [name] is a 46 y.o. 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 Hospital course has been complicated by ICU admission requiring tracheostomy, bacteremia, concern for endocarditis, and persistent hypotension. He has completed a 4 week course of IV antibiotics and is stable for discharge once a safe plan has been formed.  Principal  Problem:   Acute respiratory failure with hypoxia (HCC) Active Problems:   Laryngeal cancer (HCC)   Goals of care, counseling/discussion   Hypotension   Protein-calorie malnutrition, severe   Drug abuse and dependence ([place])   Community acquired pneumonia due to Pneumococcus [facility])   Anxiety state   Aspiration pneumonia of right lower lobe (HCC)   Supraglottic airway obstruction   Chronic HFrEF (heart failure with reduced ejection fraction) ([place])   Tracheostomy status (HCC)   Bacterial endocarditis   Pressure injury of skin   S/P percutaneous endoscopic gastrostomy (PEG) tube placement ([place])  #Tracheostomy dependent #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  Reports some dyspnea when he stood up to work with PT yesterday but otherwise respiratory status has been stable. On exam, he has normal work of breathing and I did not appreciate any crackles or wheezing.  We will continue to monitor closely and repeat CXR if symptoms of dyspnea continues. -Continue PT/OT -Hypertonic nebulizer q8h PRN  secretions -Robitussin q6h PRN secretions -Routine trach care per RRT  -Toradol 30 mg IV q6h PRN moderate pain -Chloraseptic spray PRN, lidocaine mouth wash PRN throat irritation   #Severe protein-calorie malnutrition #PEG tube dependent Weight as of 7/3 is 88.9 lbs. Up 6 lbs in the last 2 weeks. Weight at admission was 93.2 lbs. currently tolerating p.o. intake and getting nighttime tube feeds -RD following, appreciate their assistance -Continue night tube feeding -Encourage PO intake as tolerated -Zofran PRN-be cautious with QT given on chronic methadone   #Social determinants of health Per CSW, patient still has an approval apartment they are still waiting for [facility] to inspect it.  -Pending apartment inspection -DME hospital bed ordered 06/28.   #Chronic hypotension Blood pressure remained stable. SBP in the 100s to 150s in the last 24 hrs.  -Continue midodrine 5 mg 3 times daily -MAP goal >60  #Opioid use disorder #Chronic methadone use -Continue daily methadone   #Anxiety #Insomnia Chronic and stable. -Continue duloxetine, trazodone QHS, hydroxyzine '100mg'$  q6h PRN anxiety   Best Practice: Diet: Regular diet and tube feeds (nighttime trickle) IVF: None VTE: rivaroxaban (XARELTO) tablet 10 mg Start: 07/09/21 0929 Code: Full AB: None DISPO: Anticipated discharge pending Home enviroment access/layout.  Signed: Lacinda Axon, MD 07/31/2021, 7:16 AM  Pager: [phone] Internal Medicine Teaching Service After 5pm on weekdays and 1pm on weekends: On Call pager: [phone]

## 2021-07-31 NOTE — Progress Notes (Cosign Needed)
    Durable Medical Equipment  (From admission, onward)           Start     Ordered   07/31/21 1448  For home use only DME Tube feeding  Once        07/31/21 1448   07/31/21 1447  For home use only DME 4 wheeled rolling walker with seat  Once       Question:  Patient needs a walker to treat with the following condition  Answer:  Generalized weakness   07/31/21 1446   07/31/21 1447  For home use only DME Tube feeding pump  Once       Question:  Length of Need  Answer:  Lifetime   07/31/21 1447   07/31/21 1444  For home use only DME Trach supplies  (For Home Use Only DME Trach Supplies)  Once       Question Answer Comment  Trach Type Shiley   Back Up Trach Type Shiley   Cuffed or Uncuffed Uncuffed   Fenestrated No   Size 4 mm   XLT Length No   Trach Supplies/Equipment for Home Use Tube Holder/Collar   Trach Supplies/Equipment for Home Use Tracheostomy Drain Dressings   Trach Supplies/Equipment for Home Use Medical Suction Machine   Trach Supplies/Equipment for Home Use Suction Catheters   Trach Supplies/Equipment for Home Use Tracheostomy Care Cleaning Kits   Trach Supplies/Equipment for Home Use Speaking Valve   Trach Supplies/Equipment for Home Use HME Device   Suction Catheter Size 10 French (for size 4)   Cleaning Kits 60      07/31/21 1445   07/25/21 1044  For home use only DME Hospital bed  Once       Question Answer Comment  Length of Need Lifetime   Bed type Semi-electric      07/25/21 1043

## 2021-07-31 NOTE — TOC Progression Note (Addendum)
Transition of Care [facility]) - Progression Note    Patient Details  Name: David [name] MRN: [id number] Date of Birth: [birth year]  Transition of Care [facility]) CM/SW [place], RN Phone Number: 07/31/2021, 2:13 PM  Clinical Narrative:    CM and MSW with DTP Team continue to follow the patient for [facility] needs for home.  No discharge date has been determined at this time.  Patient's available apartment needs to be inspected prior to patient's transfer to the apartment.  CM with DTP Team will complete [place] documents closer to discharge from the hospital once apartment complete address/ discharge date has been determined.  The patient has been performing tracheostomy care per RT/bedside nursing notes.  Home Health private duty nursing availability is not an option for this patient since he lives alone and does not have 24 hour caregiver availability.  Personal Care services will be applied for closer to discharge to home/apartment - will need new address added to the document and signed by attending physician.  DME will be coordinated and delivered to the apartment prior to discharge dater through [employer] - including needed supplies.  DME orders placed in Epic to be signed by Dr. Coy Saunas - including tracheostomy and airway supplies, previous hospital bed order placed, Tube feeding pump and tube feeding order.  [employer] will be called closer to discharge and latest nutrition consult note be shared so that tube feeding supplies can be delivered through [facility] insurance provider.  DME note was placed and all orders to be signed by the attending physician for pending discharge date once apartment has been inspected and ready for patient's occupancy.  CM and MSW with DTP Team will follow for TOC needs - no discharge date at this time.   Expected Discharge Plan: [place] Barriers to Discharge: Homeless with medical needs  Expected Discharge  Plan and Services Expected Discharge Plan: [place] In-house Referral: Clinical Social Work Discharge Planning Services: CM Consult Post Acute Care Choice: [place] arrangements for the past 2 months: Single Family Home                                       Social Determinants of Health (SDOH) Interventions    Readmission Risk Interventions     No data to display

## 2021-07-31 NOTE — Progress Notes (Signed)
Mobility Specialist: Progress Note   07/31/21 1545  Mobility  Activity Ambulated with assistance in hallway  Level of Assistance Modified independent, requires aide device or extra time  Assistive Device Four wheel walker  Distance Ambulated (ft) 230 ft  Activity Response Tolerated well  $Mobility charge 1 Mobility   Received pt in bed having no complaints and agreeable to mobility. Pt was asymptomatic throughout ambulation and returned to room w/o fault. Left in bed w/ call bell in reach and all needs met.  [facility] Sherise Geerdes Mobility Specialist Mobility Specialist [address] [place]: [phone]

## 2021-08-01 DIAGNOSIS — J9601 Acute respiratory failure with hypoxia: Secondary | ICD-10-CM | POA: Diagnosis not present

## 2021-08-01 DIAGNOSIS — Z8521 Personal history of malignant neoplasm of larynx: Secondary | ICD-10-CM | POA: Diagnosis not present

## 2021-08-01 DIAGNOSIS — E43 Unspecified severe protein-calorie malnutrition: Secondary | ICD-10-CM | POA: Diagnosis not present

## 2021-08-01 DIAGNOSIS — Z930 Tracheostomy status: Secondary | ICD-10-CM | POA: Diagnosis not present

## 2021-08-01 MED ORDER — KETOROLAC TROMETHAMINE 15 MG/ML IJ SOLN
30.0000 mg | Freq: Four times a day (QID) | INTRAMUSCULAR | Status: DC | PRN
  Administered 2021-08-01 – 2021-08-05 (×12): 30 mg via INTRAVENOUS
  Filled 2021-08-01 (×13): qty 2

## 2021-08-01 NOTE — Progress Notes (Addendum)
CSW spoke with David [name] at [facility] who states the patient's apartment is still waiting for an inspection from the [employer]. David [name] states the apartment should be inspected at some point this week.  CSW updated patient on information.  Madilyn Fireman, MSW, LCSW Transitions of Care  Clinical Social Worker II [phone]

## 2021-08-01 NOTE — Plan of Care (Signed)
  Problem: Activity: Goal: Ability to tolerate increased activity will improve Outcome: Progressing   Problem: Clinical Measurements: Goal: Ability to maintain a body temperature in the normal range will improve Outcome: Progressing   Problem: Respiratory: Goal: Ability to maintain adequate ventilation will improve Outcome: Progressing Goal: Ability to maintain a clear airway will improve Outcome: Progressing   Problem: Education: Goal: Knowledge of General Education information will improve Description: Including pain rating scale, medication(s)/side effects and non-pharmacologic comfort measures Outcome: Progressing   Problem: Clinical Measurements: Goal: Ability to maintain clinical measurements within normal limits will improve Outcome: Progressing Goal: Will remain free from infection Outcome: Progressing Goal: Diagnostic test results will improve Outcome: Progressing Goal: Respiratory complications will improve Outcome: Progressing   Problem: Activity: Goal: Risk for activity intolerance will decrease Outcome: Progressing   Problem: Nutrition: Goal: Adequate nutrition will be maintained Outcome: Progressing   Problem: Coping: Goal: Level of anxiety will decrease Outcome: Progressing   Problem: Pain Managment: Goal: General experience of comfort will improve Outcome: Progressing   Problem: Safety: Goal: Ability to remain free from injury will improve Outcome: Progressing

## 2021-08-01 NOTE — Progress Notes (Signed)
Mobility Specialist Criteria Algorithm Info.   08/01/21 1400  Mobility  Activity Ambulated independently in hallway  Range of Motion/Exercises Active;All extremities  Level of Assistance Modified independent, requires aide device or extra time  Assistive Device Four wheel walker  Distance Ambulated (ft) 240 ft  Activity Response Tolerated well   Patient received in bed eager to participate in mobility. Ambulated in hallway independently with steady gait. Returned to room without complaint or incident. Was left in bed with all needs met, call bell in reach.   08/01/2021 3:55 PM  David [name], [place], [place]  JKQAS:[phone] Office: [phone]

## 2021-08-01 NOTE — Progress Notes (Signed)
Subjective:   Hospital day: 55  Overnight event: No acute events overnight.    Interim History: Patient evaluated at the bedside laying comfortably in bed.  No worsening in shortness of breath upon standing.  No new complaints today.  Objective:  Vital signs in last 24 hours: Vitals:   08/01/21 0649 08/01/21 0843 08/01/21 0847 08/01/21 1208  BP: (!) 112/54  104/70   Pulse: 79  84   Resp: 15  16   Temp: 98.6 F (37 C)     TempSrc:      SpO2: 98% 98% 98% 98%  Weight:      Height:        Filed Weights   07/14/21 1200 07/23/21 0915 07/30/21 1000  Weight: 37.4 kg 39.2 kg 40.3 kg     Intake/Output Summary (Last 24 hours) at 08/01/2021 1435 Last data filed at 08/01/2021 0900 Gross per 24 hour  Intake 1481 ml  Output 1725 ml  Net -244 ml   Net IO Since Admission: 12,616.6 mL [08/01/21 1435]  No results for input(s): "GLUCAP" in the last 72 hours.   Pertinent Labs:    Latest Ref Rng & Units 07/26/2021    7:50 AM 07/15/2021    3:14 AM 07/08/2021    3:45 AM  CBC  WBC 4.0 - 10.5 K/uL 8.6  5.8  6.3   Hemoglobin 13.0 - 17.0 g/dL 11.9  9.6  9.7   Hematocrit 39.0 - 52.0 % 36.3  29.1  29.8   Platelets 150 - 400 K/uL 273  224  232        Latest Ref Rng & Units 07/24/2021   12:08 PM 07/01/2021    1:48 AM 06/25/2021    9:54 AM  CMP  Glucose 70 - 99 mg/dL 117  101  87   BUN 6 - 20 mg/dL '21  17  13   '$ Creatinine 0.61 - 1.24 mg/dL 0.57  0.43  0.41   Sodium 135 - 145 mmol/L 134  134  131   Potassium 3.5 - 5.1 mmol/L 4.4  4.3  4.2   Chloride 98 - 111 mmol/L 96  98  94   CO2 22 - 32 mmol/L 29  31  32   Calcium 8.9 - 10.3 mg/dL 9.4  8.8  8.5    Physical Exam  General: Pleasant, chronically ill and cachectic middle-age man. No acute distress. HEENT: Trach collar stable in place. Pulmonary: Normal work of breathing on trach collar. Abdomen: PEG tube stable in place. Neuro: A&Ox3. Moves all extremities. Psych: Normal mood and affect   Assessment/Plan: David [name] is  a 46 y.o. 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 Hospital course has been complicated by ICU admission requiring tracheostomy, bacteremia, concern for endocarditis, and persistent hypotension. He has completed a 4 week course of IV antibiotics and is stable for discharge once housing is available.  Principal Problem:   Acute respiratory failure with hypoxia (HCC) Active Problems:   Laryngeal cancer (HCC)   Goals of care, counseling/discussion   Hypotension   Protein-calorie malnutrition, severe   Drug abuse and dependence ([place])   Community acquired pneumonia due to Pneumococcus 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 -Continue PT/OT -Hypertonic nebulizer q8h PRN secretions -Robitussin q6h PRN secretions -Routine trach care per RRT  -Toradol 30 mg IV q6h PRN moderate pain -Chloraseptic spray PRN, lidocaine mouth wash PRN throat irritation   #Severe protein-calorie malnutrition #PEG tube dependent Weight as of 7/3 is 88.9 lbs. Up 6 lbs since 07/14/2021. Weight at admission was 93.2 lbs. currently tolerating p.o. intake and getting nighttime tube feeds -RD following, appreciate their assistance -Continue night tube feeding -Encourage PO intake as tolerated -Zofran PRN-be cautious with QT given on chronic methadone   #Social determinants of health Per CSW, patient still has an approval apartment they are still waiting for [facility] to [facility] it.  -Pending apartment inspection -DME hospital bed  ordered 06/28.   #Chronic hypotension Blood pressure remained stable. SBP in the 100s to 150s in the last 24 hrs.  -Continue midodrine 5 mg 3 times daily -MAP goal >60  #Opioid use disorder #Chronic methadone use -Continue daily methadone   #Anxiety #Insomnia Chronic and stable. -Continue duloxetine, trazodone QHS, hydroxyzine '100mg'$  q6h PRN anxiety   Best Practice: Diet: Regular diet and tube feeds (nighttime trickle) IVF: None VTE: rivaroxaban (XARELTO) tablet 10 mg Start: 07/09/21 0929 Code: Full AB: None DISPO: Anticipated discharge pending Home enviroment access/layout.  Signed: [name], MD 08/01/2021, 2:35 PM  Pager: [phone] Internal Medicine Teaching Service After 5pm on weekdays and 1pm on weekends: On Call pager: [phone]

## 2021-08-02 DIAGNOSIS — Z8521 Personal history of malignant neoplasm of larynx: Secondary | ICD-10-CM | POA: Diagnosis not present

## 2021-08-02 DIAGNOSIS — Z930 Tracheostomy status: Secondary | ICD-10-CM | POA: Diagnosis not present

## 2021-08-02 DIAGNOSIS — J9601 Acute respiratory failure with hypoxia: Secondary | ICD-10-CM | POA: Diagnosis not present

## 2021-08-02 DIAGNOSIS — E43 Unspecified severe protein-calorie malnutrition: Secondary | ICD-10-CM | POA: Diagnosis not present

## 2021-08-02 NOTE — Plan of Care (Signed)
  Problem: Activity: Goal: Ability to tolerate increased activity will improve Outcome: Progressing   Problem: Clinical Measurements: Goal: Ability to maintain a body temperature in the normal range will improve Outcome: Progressing   Problem: Respiratory: Goal: Ability to maintain adequate ventilation will improve Outcome: Progressing Goal: Ability to maintain a clear airway will improve Outcome: Progressing   Problem: Education: Goal: Knowledge of General Education information will improve Description: Including pain rating scale, medication(s)/side effects and non-pharmacologic comfort measures Outcome: Progressing   Problem: Nutrition: Goal: Adequate nutrition will be maintained Outcome: Progressing   Problem: Coping: Goal: Level of anxiety will decrease Outcome: Progressing   Problem: Pain Managment: Goal: General experience of comfort will improve Outcome: Progressing   Problem: Safety: Goal: Ability to remain free from injury will improve Outcome: Progressing   Problem: Skin Integrity: Goal: Risk for impaired skin integrity will decrease Outcome: Progressing

## 2021-08-02 NOTE — Progress Notes (Signed)
Subjective:   Summary: David [name] is a 46 y.o. year old male currently admitted on the IMTS HD#57 for sepsis and acute hypoxemic respiratory failure.  Overnight Events: None  Patient says trach was replaced yesterday.  Says he brought up a little bit of superficial blood after replacement.  The bleeding has stopped.  No other complaints.  Objective:  Vital signs: Vitals:   08/02/21 0534 08/02/21 0823 08/02/21 0932 08/02/21 1056  BP: 125/63  (!) 169/69   Pulse: 95  88 94  Resp: '18  19 18  '$ Temp: 99.1 F (37.3 C)  98.1 F (36.7 C)   TempSrc: Oral  Oral   SpO2: 97% 97% 95% 98%  Weight:      Height:        Physical exam: Constitutional: Chronically ill appearing cachectic male with trach in place laying in bed in no acute distress Pulmonary/Chest: Normal work of breathing Skin: No rashes or lesions noted on visible skin Extremities: Observed moving all extremities  Filed Weights   07/14/21 1200 07/23/21 0915 07/30/21 1000  Weight: 37.4 kg 39.2 kg 40.3 kg     Intake/Output Summary (Last 24 hours) at 08/02/2021 1602 Last data filed at 08/02/2021 1333 Gross per 24 hour  Intake 970 ml  Output 900 ml  Net 70 ml   Net IO Since Admission: 12,686.6 mL [08/02/21 1602]  Pertinent Labs:    Latest Ref Rng & Units 07/26/2021    7:50 AM 07/15/2021    3:14 AM 07/08/2021    3:45 AM  CBC  WBC 4.0 - 10.5 K/uL 8.6  5.8  6.3   Hemoglobin 13.0 - 17.0 g/dL 11.9  9.6  9.7   Hematocrit 39.0 - 52.0 % 36.3  29.1  29.8   Platelets 150 - 400 K/uL 273  224  232        Latest Ref Rng & Units 07/24/2021   12:08 PM 07/01/2021    1:48 AM 06/25/2021    9:54 AM  CMP  Glucose 70 - 99 mg/dL 117  101  87   BUN 6 - 20 mg/dL '21  17  13   '$ Creatinine 0.61 - 1.24 mg/dL 0.57  0.43  0.41   Sodium 135 - 145 mmol/L 134  134  131   Potassium 3.5 - 5.1 mmol/L 4.4  4.3  4.2   Chloride 98 - 111 mmol/L 96  98  94   CO2 22 - 32 mmol/L 29  31  32   Calcium 8.9 - 10.3 mg/dL 9.4  8.8   8.5     Assessment/Plan:   Principal Problem:   Acute respiratory failure with hypoxia (HCC) Active Problems:   Laryngeal cancer (HCC)   Goals of care, counseling/discussion   Hypotension   Protein-calorie malnutrition, severe   Drug abuse and dependence ([place])   Community acquired pneumonia due to Pneumococcus ([place])   Anxiety state   Aspiration pneumonia of right lower lobe (HCC)   Supraglottic airway obstruction   Chronic HFrEF (heart failure with reduced ejection fraction) (HCC)   Tracheostomy status (HCC)   Bacterial endocarditis   Pressure injury of skin   S/P percutaneous endoscopic gastrostomy (PEG) tube placement [facility])   Patient Summary: David [name] is a 46 y.o. 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  His hospital course has been complicated by ICU admission requiring tracheostomy, bacteremia, concern for endocarditis, persistent hypotension.   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 - RD following - Continue night tube feeding - Encourage p.o. intake  #Social determinants of health Still awaiting apartment inspection.  #Hypotension Stable  #OUD, chronic methadone use - Continue methadone  Diet: Normal IVF: None,None VTE: NOAC Code: Full TOC recs: Awaiting apartment inspection   Dispo: Anticipated discharge to Home in 2 days pending apartment inspection and availability.   [name], MD 08/02/2021, 4:02 PM  Pager: [phone] After 5pm on weekdays and 1pm on weekends: On Call pager: [phone]

## 2021-08-03 DIAGNOSIS — Z930 Tracheostomy status: Secondary | ICD-10-CM | POA: Diagnosis not present

## 2021-08-03 DIAGNOSIS — J9601 Acute respiratory failure with hypoxia: Secondary | ICD-10-CM | POA: Diagnosis not present

## 2021-08-03 DIAGNOSIS — Z8521 Personal history of malignant neoplasm of larynx: Secondary | ICD-10-CM | POA: Diagnosis not present

## 2021-08-03 DIAGNOSIS — E43 Unspecified severe protein-calorie malnutrition: Secondary | ICD-10-CM | POA: Diagnosis not present

## 2021-08-03 MED ORDER — CHLORHEXIDINE GLUCONATE 0.12 % MT SOLN
OROMUCOSAL | Status: AC
  Administered 2021-08-03: 15 mL
  Filled 2021-08-03: qty 15

## 2021-08-03 NOTE — Progress Notes (Signed)
Subjective:   Summary: David [name] is a 46 y.o. year old male currently admitted on the IMTS HD#58 for sepsis and acute hypoxemic respiratory failure.  Overnight Events: None  Feeling okay.  Superficial bleeding from mouth stopped.  Feels like right-sided neck swelling has increased.  Wants to know when he can have his trach removed.  Objective:  Vital signs: Vitals:   08/03/21 0835 08/03/21 0928 08/03/21 1138 08/03/21 1654  BP:  (!) 149/99  (!) 147/93  Pulse:  98  77  Resp: '18 19 18 19  '$ Temp:  98.6 F (37 C)  98 F (36.7 C)  TempSrc:  Oral  Oral  SpO2:  97% 96% 94%  Weight:      Height:        Physical exam: Constitutional: Chronically ill appearing cachectic male with trach in place laying in bed in no acute distress HEENT: Palpable swelling right lateral neck under mandible unchanged from yesterday Pulmonary/Chest: Normal work of breathing Skin: No rashes or lesions noted on visible skin Extremities: Observed moving all extremities  Filed Weights   07/14/21 1200 07/23/21 0915 07/30/21 1000  Weight: 37.4 kg 39.2 kg 40.3 kg     Intake/Output Summary (Last 24 hours) at 08/03/2021 1818 Last data filed at 08/03/2021 0900 Gross per 24 hour  Intake 240 ml  Output 350 ml  Net -110 ml   Net IO Since Admission: 12,376.6 mL [08/03/21 1818]  Pertinent Labs:    Latest Ref Rng & Units 07/26/2021    7:50 AM 07/15/2021    3:14 AM 07/08/2021    3:45 AM  CBC  WBC 4.0 - 10.5 K/uL 8.6  5.8  6.3   Hemoglobin 13.0 - 17.0 g/dL 11.9  9.6  9.7   Hematocrit 39.0 - 52.0 % 36.3  29.1  29.8   Platelets 150 - 400 K/uL 273  224  232        Latest Ref Rng & Units 07/24/2021   12:08 PM 07/01/2021    1:48 AM 06/25/2021    9:54 AM  CMP  Glucose 70 - 99 mg/dL 117  101  87   BUN 6 - 20 mg/dL '21  17  13   '$ Creatinine 0.61 - 1.24 mg/dL 0.57  0.43  0.41   Sodium 135 - 145 mmol/L 134  134  131   Potassium 3.5 - 5.1 mmol/L 4.4  4.3  4.2   Chloride 98 - 111 mmol/L 96   98  94   CO2 22 - 32 mmol/L 29  31  32   Calcium 8.9 - 10.3 mg/dL 9.4  8.8  8.5     Assessment/Plan:   Principal Problem:   Acute respiratory failure with hypoxia (HCC) Active Problems:   Laryngeal cancer (HCC)   Goals of care, counseling/discussion   Hypotension   Protein-calorie malnutrition, severe   Drug abuse and dependence ([place])   Community acquired pneumonia due to Pneumococcus ([place])   Anxiety state   Aspiration pneumonia of right lower lobe (HCC)   Supraglottic airway obstruction   Chronic HFrEF (heart failure with reduced ejection fraction) (HCC)   Tracheostomy status (HCC)   Bacterial endocarditis   Pressure injury of skin   S/P percutaneous endoscopic gastrostomy (PEG) tube placement [facility])   Patient Summary: David [name] is a 46 y.o. with a PMH of stage IV SCC of the larynx status post chemoradiation, malnutrition, OUD on  methadone who was admitted for sepsis and acute hypoxic respiratory failure.  His hospital course has been complicated by ICU admission requiring tracheostomy, bacteremia, concern for endocarditis, persistent hypotension.   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  Will follow-up with ENT for recommendations regarding future need for tracheostomy. - Continue PT OT - Continue hypertonic nebs - Continue Robitussin - Continue routine trach care - Continue Toradol 30 mg IV every 6 as needed for moderate pain  #Severe protein calorie malnutrition #PEG tube dependent Weight stable around 88.9 pounds. - RD following - Continue night tube feeding - Encourage p.o. intake  #Social determinants of health Still awaiting apartment inspection.  #Hypotension Stable  #OUD, chronic methadone use - Continue methadone  Diet: Normal IVF: None,None VTE: NOAC Code: Full TOC recs: Awaiting apartment inspection   Dispo: Anticipated discharge to Home in 2 days pending apartment inspection and  availability.   [name], MD 08/03/2021, 6:18 PM  Pager: [phone] After 5pm on weekdays and 1pm on weekends: On Call pager: [phone]

## 2021-08-03 NOTE — Discharge Summary (Incomplete)
Name: David [name] MRN: [id number] DOB: Oct 26, 1975 46 y.o. PCP: David Bene, MD  Date of Admission: 06/06/2021  9:46 AM Date of Discharge: No discharge date for patient encounter. Attending Physician: David Catalina, MD  Discharge Diagnosis: 1. Principal Problem:   Acute respiratory failure with hypoxia (HCC) Active Problems:   Laryngeal cancer (HCC)   Goals of care, counseling/discussion   Hypotension   Protein-calorie malnutrition, severe   Drug abuse and dependence (HCC)   Community acquired pneumonia due to Pneumococcus [facility])   Anxiety state   Aspiration pneumonia of right lower lobe (HCC)   Supraglottic airway obstruction   Chronic HFrEF (heart failure with reduced ejection fraction) (HCC)   Tracheostomy status (HCC)   Bacterial endocarditis   Pressure injury of skin   S/P percutaneous endoscopic gastrostomy (PEG) tube placement [facility])  ***  Discharge Medications: Allergies as of 08/03/2021   No Known Allergies   Med Rec must be completed prior to using this [facility]***        Durable Medical Equipment  (From admission, onward)           Start     Ordered   07/31/21 1448  For home use only DME Tube feeding  Once        07/31/21 1448   07/31/21 1447  For home use only DME 4 wheeled rolling walker with seat  Once       Question:  Patient needs a walker to treat with the following condition  Answer:  Generalized weakness   07/31/21 1446   07/31/21 1447  For home use only DME Tube feeding pump  Once       Question:  Length of Need  Answer:  Lifetime   07/31/21 1447   07/31/21 1444  For home use only DME Trach supplies  (For Home Use Only DME Trach Supplies)  Once       Question Answer Comment  Trach Type Shiley   Back Up Trach Type Shiley   Cuffed or Uncuffed Uncuffed   Fenestrated No   Size 4 mm   XLT Length No   Trach Supplies/Equipment for Home Use Tube Holder/Collar   Trach Supplies/Equipment for Home Use Tracheostomy Drain  Dressings   Trach Supplies/Equipment for Home Use Medical Suction Machine   Trach Supplies/Equipment for Home Use Suction Catheters   Trach Supplies/Equipment for Home Use Tracheostomy Care Cleaning Kits   Trach Supplies/Equipment for Home Use Speaking Valve   Trach Supplies/Equipment for Home Use HME Device   Suction Catheter Size 10 French (for size 4)   Cleaning Kits 60      07/31/21 1445   07/25/21 1044  For home use only DME Hospital bed  Once       Question Answer Comment  Length of Need Lifetime   Bed type Semi-electric      07/25/21 1043            Disposition and follow-up:   DavidSterling [name] was discharged from [facility] in {DISCHARGE CONDITION:19696} condition.  At the hospital follow up visit please address:  1.  ***  2.  Labs / imaging needed at time of follow-up: ***  3.  Pending labs/ test needing follow-up: ***  Follow-up Appointments:  Follow-up Information     [facility], [place]. Call.   Specialty: General Surgery Why: As needed if issues with feeding tube Contact information: [address] [place] [address] [phone]  David Bene, MD. Schedule an appointment as soon as possible for a visit.   Specialty: Internal Medicine Why: Please follow up with your primary care physician within 7-10 days of discharge from the hospital. Contact information: [address] [place] [address] [phone]         [facility] Follow up.   Why: [employer] to provide dme including rollator, hospital bed,  trach supplies, suction equipment , tube feeding supplies and tube feeding pump. Contact information: [address] [place] [address] [phone]         [facility] Follow up.   Specialty: PALLIATIVE CARE Contact information: [address] [place] [address] [phone]                Hospital Course by  problem list: 1. ***  Discharge Exam:   BP 118/76 (BP Location: Left Leg)   Pulse 86   Temp 98.8 F (37.1 C) (Oral)   Resp 18   Ht 5\' 11"  (1.803 m) Comment: from previous admission  Wt 40.3 kg   SpO2 96%   BMI 12.40 kg/m  General: HEENT: Cardiovascular: Respiratory: Abdominal: Extremities: Skin: Neuro: Psych:  Pertinent Labs, Studies, and Procedures:  ***  Discharge Instructions:   Signed: Marrianne Mood, MD 08/03/2021, 6:56 AM   Pager: [phone]

## 2021-08-04 DIAGNOSIS — Z8521 Personal history of malignant neoplasm of larynx: Secondary | ICD-10-CM | POA: Diagnosis not present

## 2021-08-04 DIAGNOSIS — E43 Unspecified severe protein-calorie malnutrition: Secondary | ICD-10-CM | POA: Diagnosis not present

## 2021-08-04 DIAGNOSIS — Z930 Tracheostomy status: Secondary | ICD-10-CM | POA: Diagnosis not present

## 2021-08-04 DIAGNOSIS — J9601 Acute respiratory failure with hypoxia: Secondary | ICD-10-CM | POA: Diagnosis not present

## 2021-08-04 NOTE — Progress Notes (Signed)
Mobility Specialist Progress Note:   08/04/21 1507  Mobility  Activity Ambulated with assistance in hallway  Level of Assistance Standby assist, set-up cues, supervision of patient - no hands on  Assistive Device Four wheel walker  Distance Ambulated (ft) 240 ft  Activity Response Tolerated well  $Mobility charge 1 Mobility   Pt received in bed agreeable to walk. No complaints of pain. Left in bed with call bell in reach and all needs met.   David [name] Mobility Specialist`

## 2021-08-04 NOTE — Progress Notes (Signed)
IMTS Progress Note.    Note originally placed in error on 08/03/21 as a d/c summary, so transcribed here.  Gilles Chiquito, MD     Subjective:  Summary: David [name] is a 46 y.o. year old male currently admitted on the IMTS HD#59 for sepsis and acute hypoxemic respiratory failure.  Overnight Events: None  Feeling okay.  No new complaints today.  Objective: Vital signs: Vitals:  08/04/21 0832 08/04/21 0857 08/04/21 1122 08/04/21 1536 BP: 109/63    Pulse: 90 92  82 Resp: '16 18  17 '$ Temp: 98.8 F (37.1 C)    TempSrc: Oral    SpO2: 96% 97% 98% 98% Weight:     Height:       Physical exam: Constitutional: Chronically ill appearing cachectic male with trach in place laying in bed in no acute distress HEENT: Visible swelling right lateral neck under mandible unchanged from yesterday Pulmonary/Chest: Normal work of breathing Skin: No rashes or lesions noted on visible skin Neuro: Observed moving all extremities  Filed Weights  07/14/21 1200 07/23/21 0915 07/30/21 1000 Weight: 37.4 kg 39.2 kg 40.3 kg    Intake/Output Summary (Last 24 hours) at 08/04/2021 1538 Last data filed at 08/04/2021 1100 Gross per 24 hour Intake 780 ml Output 200 ml Net 580 ml  Net IO Since Admission: 13,316.6 mL [08/04/21 1538]   Assessment/Plan:  Principal Problem:   Acute respiratory failure with hypoxia (HCC) Active Problems:   Laryngeal cancer (HCC)   Goals of care, counseling/discussion   Hypotension   Protein-calorie malnutrition, severe   Drug abuse and dependence ([place])   Community acquired pneumonia due to Pneumococcus [facility])   Anxiety state   Aspiration pneumonia of right lower lobe (HCC)   Supraglottic airway obstruction   Chronic HFrEF (heart failure with reduced ejection fraction) (HCC)   Tracheostomy status (HCC)   Bacterial endocarditis   Pressure injury of skin   S/P percutaneous endoscopic gastrostomy (PEG) tube placement [facility])   Patient Summary: David [name]  is a 46 y.o. 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  His hospital course has been complicated by ICU admission requiring tracheostomy, bacteremia, concern for endocarditis, persistent hypotension.   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  Spoke to ENT yesterday afternoon and they may see patient on Monday. - Continue PT OT - Continue hypertonic nebs - Continue Robitussin - Continue routine trach care - Continue Toradol 30 mg IV every 6 as needed for moderate pain - Follow-up with ENT on Monday  #Severe protein calorie malnutrition #PEG tube dependent - RD following - Continue night tube feeding - Encourage p.o. intake  #Social determinants of health Still awaiting apartment inspection.  #Hypotension Stable  #OUD, chronic methadone use - Continue methadone  Diet: Normal IVF: None,None VTE: NOAC Code: Full TOC recs: Awaiting apartment inspection   Dispo: Anticipated discharge to Home in 2 days pending apartment inspection and availability.   [name], MD 08/04/2021, 3:38 PM  Pager: [phone] After 5pm on weekdays and 1pm on weekends: On Call pager: [phone]   Date: 08/04/2021  Patient name: David [name]  Medical record number: [id number]  Date of birth: 01/28/1976        I have evaluated this patient and I have discussed the plan of care with the house staff. Please see Dr. Hipolito Bayley note above for complete details. I concur with  his findings and plan.   Sid Falcon, MD 08/04/2021, 8:20 PM

## 2021-08-05 DIAGNOSIS — E43 Unspecified severe protein-calorie malnutrition: Secondary | ICD-10-CM | POA: Diagnosis not present

## 2021-08-05 DIAGNOSIS — Z930 Tracheostomy status: Secondary | ICD-10-CM | POA: Diagnosis not present

## 2021-08-05 DIAGNOSIS — Z8521 Personal history of malignant neoplasm of larynx: Secondary | ICD-10-CM | POA: Diagnosis not present

## 2021-08-05 DIAGNOSIS — J9601 Acute respiratory failure with hypoxia: Secondary | ICD-10-CM | POA: Diagnosis not present

## 2021-08-05 MED ORDER — ACETAMINOPHEN 160 MG/5ML PO SOLN
975.0000 mg | Freq: Three times a day (TID) | ORAL | Status: DC
  Administered 2021-08-05 – 2021-08-10 (×13): 975 mg
  Filled 2021-08-05 (×13): qty 40.6

## 2021-08-05 MED ORDER — SENNOSIDES 8.8 MG/5ML PO SYRP
5.0000 mL | ORAL_SOLUTION | Freq: Two times a day (BID) | ORAL | Status: DC
  Administered 2021-08-05 – 2021-08-21 (×19): 5 mL
  Filled 2021-08-05 (×35): qty 5

## 2021-08-05 MED ORDER — KETOROLAC TROMETHAMINE 30 MG/ML IJ SOLN
30.0000 mg | Freq: Four times a day (QID) | INTRAMUSCULAR | Status: AC | PRN
  Administered 2021-08-05 – 2021-08-06 (×4): 30 mg via INTRAVENOUS
  Filled 2021-08-05 (×4): qty 1

## 2021-08-05 NOTE — Plan of Care (Signed)

## 2021-08-05 NOTE — Progress Notes (Signed)
Subjective:   Hospital day: 60  Overnight event: No acute events overnight  Interim History: Patient was evaluated at bedside laying comfortably in bed getting ready to eat his breakfast. He has no acute concerns or complaints. He reports almost pulling his PEG tube by accident today. Informed him that we are still waiting for the county to inspect his apartment.  Objective:  Vital signs in last 24 hours: Vitals:   08/04/21 2008 08/04/21 2315 08/05/21 0315 08/05/21 0421  BP: 136/88  (!) 87/70   Pulse: 89 88 83 79  Resp: '18 16 20 18  '$ Temp: 99.3 F (37.4 C)  98.7 F (37.1 C)   TempSrc: Oral  Oral   SpO2: 99% 100% 99% 98%  Weight:      Height:        Filed Weights   07/14/21 1200 07/23/21 0915 07/30/21 1000  Weight: 37.4 kg 39.2 kg 40.3 kg     Intake/Output Summary (Last 24 hours) at 08/05/2021 0759 Last data filed at 08/05/2021 0600 Gross per 24 hour  Intake 2204.07 ml  Output 975 ml  Net 1229.07 ml   Net IO Since Admission: 14,325.67 mL [08/05/21 0759]  No results for input(s): "GLUCAP" in the last 72 hours.   Pertinent Labs:    Latest Ref Rng & Units 07/26/2021    7:50 AM 07/15/2021    3:14 AM 07/08/2021    3:45 AM  CBC  WBC 4.0 - 10.5 K/uL 8.6  5.8  6.3   Hemoglobin 13.0 - 17.0 g/dL 11.9  9.6  9.7   Hematocrit 39.0 - 52.0 % 36.3  29.1  29.8   Platelets 150 - 400 K/uL 273  224  232        Latest Ref Rng & Units 07/24/2021   12:08 PM 07/01/2021    1:48 AM 06/25/2021    9:54 AM  CMP  Glucose 70 - 99 mg/dL 117  101  87   BUN 6 - 20 mg/dL '21  17  13   '$ Creatinine 0.61 - 1.24 mg/dL 0.57  0.43  0.41   Sodium 135 - 145 mmol/L 134  134  131   Potassium 3.5 - 5.1 mmol/L 4.4  4.3  4.2   Chloride 98 - 111 mmol/L 96  98  94   CO2 22 - 32 mmol/L 29  31  32   Calcium 8.9 - 10.3 mg/dL 9.4  8.8  8.5     Imaging: No results found.  Physical Exam  General: Pleasant, chronically ill and cachectic middle-age man. No acute distress. HEENT: Trach collar stable in  place. CV: RRR. No m/r/g. No LE edema Pulmonary: Normal work of breathing on trach collar. Anterior lung sounds clear to auscultation bilaterally. Abdomen: PEG tube stable in place. Neuro: A&Ox3. Moves all extremities. Psych: Normal mood and affect  Assessment/Plan: David [name] is a 46 y.o. male with hx of David [name] is a 46 y.o. 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 Hospital course has been complicated by ICU admission requiring tracheostomy, bacteremia, concern for endocarditis, and persistent hypotension. He has completed a 4 week course of IV antibiotics and is stable for discharge once a safe plan has been formed.  Principal Problem:   Acute respiratory failure with hypoxia (HCC) Active Problems:   Laryngeal cancer (HCC)   Goals of care, counseling/discussion   Hypotension  Protein-calorie malnutrition, severe   Drug abuse and dependence ([place])   Community acquired pneumonia due to Pneumococcus [facility])   Anxiety state   Aspiration pneumonia of right lower lobe (HCC)   Supraglottic airway obstruction   Chronic HFrEF (heart failure with reduced ejection fraction) (HCC)   Tracheostomy status (HCC)   Bacterial endocarditis   Pressure injury of skin   S/P percutaneous endoscopic gastrostomy (PEG) tube placement ([place])  #Tracheostomy dependent #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 Respiratory status remained stable. Plan to have ENT reevaluate patient again prior to discharge. Encourage patient to try to get up with PT as much as possible. -Continue PT/OT -Hypertonic nebulizer q8h PRN secretions -Robitussin q6h PRN secretions -Routine trach care per RRT  -Toradol 30 mg IV q6h PRN moderate pain -Chloraseptic spray PRN, lidocaine mouth wash PRN throat irritation   #Severe protein-calorie  malnutrition #PEG tube dependent Last weight on 7/3 was 88.9 lbs. admission weight was 93.2 lbs.  Continues to tolerate p.o. intake and nighttime feeding well. -RD following, appreciate their assistance -Continue night tube feeding -Encourage PO intake as tolerated -Zofran PRN-be cautious with QT given on chronic methadone   #Social determinants of health Per CSW, patient still has an approval apartment they are still waiting for [facility] to inspect it.  -Pending apartment inspection -DME hospital bed ordered 06/28.   #Chronic hypotension His blood pressure has remained stable in the 120s to 130s since decreasing midodrine dose. -Continue midodrine 5 mg 3 times daily -MAP goal >60  #Opioid use disorder #Chronic methadone use -Continue daily methadone   #Anxiety #Insomnia Chronic and stable. -Continue duloxetine, trazodone QHS, hydroxyzine '100mg'$  q6h PRN anxiety   Best Practice: Diet: Regular diet and tube feeds (nighttime trickle) IVF: None VTE: rivaroxaban (XARELTO) tablet 10 mg Start: 07/09/21 0929 Code: Full AB: None DISPO: Anticipated discharge pending Home enviroment access/layout.  Signed: Lacinda Axon, MD 08/05/2021, 7:59 AM  Pager: [phone] Internal Medicine Teaching Service After 5pm on weekdays and 1pm on weekends: On Call pager: [phone]

## 2021-08-06 DIAGNOSIS — E43 Unspecified severe protein-calorie malnutrition: Secondary | ICD-10-CM | POA: Diagnosis not present

## 2021-08-06 DIAGNOSIS — Z930 Tracheostomy status: Secondary | ICD-10-CM | POA: Diagnosis not present

## 2021-08-06 DIAGNOSIS — J9601 Acute respiratory failure with hypoxia: Secondary | ICD-10-CM | POA: Diagnosis not present

## 2021-08-06 DIAGNOSIS — Z8521 Personal history of malignant neoplasm of larynx: Secondary | ICD-10-CM | POA: Diagnosis not present

## 2021-08-06 MED ORDER — KETOROLAC TROMETHAMINE 15 MG/ML IJ SOLN
15.0000 mg | Freq: Four times a day (QID) | INTRAMUSCULAR | Status: DC | PRN
  Administered 2021-08-06 – 2021-08-08 (×7): 15 mg via INTRAVENOUS
  Filled 2021-08-06 (×8): qty 1

## 2021-08-06 NOTE — Progress Notes (Signed)
Nutrition Follow-up  DOCUMENTATION CODES:   Severe malnutrition in context of chronic illness, Underweight  INTERVENTION:   Continue TF via PEG: Osmolite 1.5 @ 85 mL/hr x 12 hours (1020 mL/day) 45 mL ProSource TF - TID 125 mL free water flush q6h Provides 1650 kcal, 97 gm of protein, and 1277 mL free water (TF + flush) daily. Continue Multivitamin w/ minerals daily  NUTRITION DIAGNOSIS:   Severe Malnutrition related to chronic illness (head and neck cancer) as evidenced by severe muscle depletion, severe fat depletion, percent weight loss (23% weight loss within 3 months). - Progressing, being addressed via TF  GOAL:   Patient will meet greater than or equal to 90% of their needs - Being met via TF  MONITOR:   PO intake, Labs, Weight trends, TF tolerance  REASON FOR ASSESSMENT:   Consult Assessment of nutrition requirement/status, Enteral/tube feeding initiation and management  ASSESSMENT:   46 yo male admitted with respiratory failure, suspected aspiration PNA. 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  5/12 - Cortrak placed (tip post pyloric) 5/13 - TF started; Trach placed 5/15 - diet advanced to Dysphagia 3, Honey thick liquids 5/22 - attempted placement of PEG in IR (not placed 2/2 anatomy) 5/23 - Cortrak removed 5/24 - PEG placed 5/26 - diet advanced to regular, Honey Thick liquids 5/30 - transitioned to bolus feeds 6/13 - transitioned to nocturnal feeds 6/21 - trach downsized to #4 6/28 - MBS w/ SLP, advanced to thin liquids   Pt resting in bed. Reports that he has been eating ok and tolerating his nocturnal tube feeds. Denies any nausea or vomiting.Pt PO intake includes 25-100%, with an average of 63%. Denies any issues with meals since moving floors with a new menu.  This RD encouraged pt with his weight being up another 1.6 kg this week.   Pt with no  other questions or concerns at this time.   Medications reviewed and include: Folic acid, MVI, Protonix, Miralax,Senokot, Thiamine Labs reviewed.  Diet Order:   Diet Order             Diet regular Room service appropriate? Yes; Fluid consistency: Thin  Diet effective now                   EDUCATION NEEDS:   No education needs have been identified at this time  Skin:  Skin Assessment: Skin Integrity Issues: Skin Integrity Issues:: Stage II Stage II: Sacrum  Last BM:  7/9  Height:   Ht Readings from Last 1 Encounters:  06/07/21 5' 11"  (1.803 m)    Weight:   Wt Readings from Last 1 Encounters:  08/06/21 41.9 kg    Ideal Body Weight:  78.2 kg  BMI:  Body mass index is 12.87 kg/m.  Estimated Nutritional Needs:   Kcal:  1500-1700  Protein:  65-75 gm  Fluid:  1.5-1.7 L    David [name] RD, LDN Clinical Dietitian See [facility] for contact information.

## 2021-08-06 NOTE — TOC Progression Note (Signed)
Transition of Care [facility]) - Progression Note    Patient Details  Name: David [name] MRN: [id number] Date of Birth: July 11, 1975  Transition of Care [facility]) CM/SW [place], RN Phone Number: 08/06/2021, 12:11 PM  Clinical Narrative:    CM met with the patient at the bedside and the patient is waiting on availability on his apartment through [facility] at this time.  The apartment is waiting to be inspected by [facility] at this time.  Discharge has not been determined at this time.  I spoke with Medical Team at the bedside and [place] forms to be signed by the physician and forwarded to [facility] once apartment address and availability has been obtained.  I called and left a voicemail message with Epimenio Sarin, CM with [place] for update regarding inspection of the apartment or potential discharge date - no discharge date has been determined at this time.   Expected Discharge Plan: [place] Barriers to Discharge: Homeless with medical needs  Expected Discharge Plan and Services Expected Discharge Plan: [place] In-house Referral: Clinical Social Work Discharge Planning Services: CM Consult Post Acute Care Choice: [place] arrangements for the past 2 months: Single Family Home                                       Social Determinants of Health (SDOH) Interventions    Readmission Risk Interventions     No data to display

## 2021-08-06 NOTE — Progress Notes (Signed)
Subjective:   Hospital day 75.  Interval events: none.  Wants to know why his Toradol was discontinued.  Otherwise feeling the same from day-to-day.  No new complaints.  Objective:  Vital signs: Blood pressure 128/76, pulse 80, temperature 99.5 F (37.5 C), temperature source Oral, resp. rate 16, height '5\' 11"'$  (1.803 m), weight 41.9 kg, SpO2 97 %.  Physical exam: Constitutional: Frail-appearing male laying in bed in no apparent distress. Pulmonary: Normal work of breathing noted. Skin: No rashes or lesions on visible skin. Extremities: Moves all extremities. Neuro: Alert and oriented x4. Psych: Appropriate mood and affect.   Intake/Output Summary (Last 24 hours) at 08/06/2021 1532 Last data filed at 08/06/2021 1300 Gross per 24 hour  Intake 2523.75 ml  Output 1250 ml  Net 1273.75 ml    Pertinent Labs:    Latest Ref Rng & Units 07/26/2021    7:50 AM 07/15/2021    3:14 AM 07/08/2021    3:45 AM  CBC  WBC 4.0 - 10.5 K/uL 8.6  5.8  6.3   Hemoglobin 13.0 - 17.0 g/dL 11.9  9.6  9.7   Hematocrit 39.0 - 52.0 % 36.3  29.1  29.8   Platelets 150 - 400 K/uL 273  224  232        Latest Ref Rng & Units 07/24/2021   12:08 PM 07/01/2021    1:48 AM 06/25/2021    9:54 AM  CMP  Glucose 70 - 99 mg/dL 117  101  87   BUN 6 - 20 mg/dL '21  17  13   '$ Creatinine 0.61 - 1.24 mg/dL 0.57  0.43  0.41   Sodium 135 - 145 mmol/L 134  134  131   Potassium 3.5 - 5.1 mmol/L 4.4  4.3  4.2   Chloride 98 - 111 mmol/L 96  98  94   CO2 22 - 32 mmol/L 29  31  32   Calcium 8.9 - 10.3 mg/dL 9.4  8.8  8.5     Assessment/Plan:   Principal Problem:   Acute respiratory failure with hypoxia (HCC) Active Problems:   Laryngeal cancer (HCC)   Goals of care, counseling/discussion   Hypotension   Protein-calorie malnutrition, severe   Drug abuse and dependence ([place])   Community acquired pneumonia due to Pneumococcus ([place])   Anxiety state   Aspiration pneumonia of right lower lobe (HCC)    Supraglottic airway obstruction   Chronic HFrEF (heart failure with reduced ejection fraction) (HCC)   Tracheostomy status (HCC)   Bacterial endocarditis   Pressure injury of skin   S/P percutaneous endoscopic gastrostomy (PEG) tube placement [facility])   Patient Summary: David [name] is a 46 y.o. 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 Hospital course has been complicated by ICU admission requiring tracheostomy, bacteremia, concern for endocarditis, and persistent hypotension. He has completed a 4 week course of IV antibiotics and is stable for discharge once a safe plan has been formed.  #Tracheostomy dependent #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 Respiratory status remained stable. Plan to have ENT reevaluate patient again prior to discharge. Encourage patient to try to get up with PT as much as possible. -Continue PT/OT -Hypertonic nebulizer q8h PRN secretions -Robitussin q6h PRN secretions -Routine trach care per RRT  -  Toradol 30 mg IV q6h PRN moderate pain, to be renewed on 08/10/2021. -Chloraseptic spray PRN, lidocaine mouth wash PRN throat irritation   #Severe protein-calorie malnutrition #PEG tube dependent Last weight on 7/3 was 88.9 lbs. admission weight was 93.2 lbs.  Continues to tolerate p.o. intake and nighttime feeding well. -RD following, appreciate their assistance -Continue night tube feeding -Encourage PO intake as tolerated -Zofran PRN-be cautious with QT given on chronic methadone   #Social determinants of health Per CSW, patient still has an approval apartment they are still waiting for [employer] [facility] to inspect it.  -Pending apartment inspection -DME hospital bed ordered 06/28. - Signed and returned form [id number] to social work for patient's personal care services upon discharge   #Chronic  hypotension His blood pressure has remained stable in the 120s to 130s since decreasing midodrine dose. -Continue midodrine 5 mg 3 times daily -MAP goal >60   #Opioid use disorder #Chronic methadone use -Continue daily methadone   #Anxiety #Insomnia Chronic and stable. -Continue duloxetine, trazodone QHS, hydroxyzine '100mg'$  q6h PRN anxiety  Diet:  thin liquids and night time tube feeds IVF: None,None VTE: NOAC Code: Full  Dispo: Anticipated discharge to Home in 2 days pending home environment access/layout.   [name], MD 08/06/2021, 3:32 PM  Pager: [phone] After 5pm on weekdays and 1pm on weekends: On Call pager: [phone]

## 2021-08-06 NOTE — Progress Notes (Addendum)
Speech Language Pathology Treatment: Dysphagia;Passy Muir Speaking valve  Patient Details Name: David [name] MRN: [id number] DOB: Oct 28, 1975 Today's Date: 08/06/2021 Time: 6270-3500 SLP Time Calculation (min) (ACUTE ONLY): 15 min  Assessment / Plan / Recommendation Clinical Impression  [name] was cooperative during therapy for PMV and dysphagia. He stated not having to perform a right head turn since most recent MBS however SLP reviewed recommendations that continues to include use of head turn. Once reminded of right head turn he performed independently and there were no concerns of airway intrusion. He also seemed surprised he needed to wear valve when eating/drinking. He was able to donn and doff independently and only wore valve for total of several minutes to speak and eat. Vocal quality is hoarse (baseline?), vitals stable and conversing in conversation with therapist with 100% intelligibility. He is independent with PMV and has met goals related to speaking valve. Recommend continue regular texture, thin liquids via cup with right head turn and continued ST for implementation of strategies at frequency of minimum once a week. Will continue ST for dysphagia, all PMV goals have been met.   HPI HPI: 46yo male with past medical history of laryngeal cancer s/p chemo and radiation and heroin abuse presented to ED on 06/06/21 from home with complaints of shortness of breath and not being able to eat for several days. Patient was admitted on 06/06/2021 with acute hypoxic respiratory failure with sepsis secondary to aspiration pneumonia, elevated D-dimer, AKI. Developed worsening respiratory distress requiring emergent tracheostomy on 5/13. Patient got PEG on 06/20/21.      SLP Plan  Continue with current plan of care      Recommendations for follow up therapy are one component of a multi-disciplinary discharge planning process, led by the attending physician.  Recommendations may be  updated based on patient status, additional functional criteria and insurance authorization.    Recommendations  Diet recommendations: Regular;Thin liquid Liquids provided via: Cup Medication Administration: Whole meds with puree Supervision: Patient able to self feed Compensations: Slow rate;Small sips/bites;Other (Comment);Clear throat after each swallow;Hard cough after swallow (right head turn) Postural Changes and/or Swallow Maneuvers: Seated upright 90 degrees      Patient may use Passy-Muir Speech Valve: During all therapies with supervision;During all waking hours (remove during sleep) PMSV Supervision: Intermittent MD: Please consider changing trach tube to : Smaller size         Oral Care Recommendations: Oral care BID Follow Up Recommendations: Follow physician's recommendations for discharge plan and follow up therapies Assistance recommended at discharge: Intermittent Supervision/Assistance SLP Visit Diagnosis: Dysphagia, pharyngoesophageal phase (R13.14);Aphonia (R49.1) Plan: Continue with current plan of care           [name]  08/06/2021, 3:45 PM

## 2021-08-07 ENCOUNTER — Encounter (HOSPITAL_COMMUNITY): Payer: Self-pay | Admitting: Internal Medicine

## 2021-08-07 DIAGNOSIS — Z8521 Personal history of malignant neoplasm of larynx: Secondary | ICD-10-CM | POA: Diagnosis not present

## 2021-08-07 DIAGNOSIS — Z930 Tracheostomy status: Secondary | ICD-10-CM | POA: Diagnosis not present

## 2021-08-07 DIAGNOSIS — J9601 Acute respiratory failure with hypoxia: Secondary | ICD-10-CM | POA: Diagnosis not present

## 2021-08-07 DIAGNOSIS — E43 Unspecified severe protein-calorie malnutrition: Secondary | ICD-10-CM | POA: Diagnosis not present

## 2021-08-07 NOTE — Plan of Care (Signed)

## 2021-08-07 NOTE — Progress Notes (Signed)
Subjective:   Hospital day 56.  Interval events: None  Patient complains of increased swelling of right mandibular area at site of known cancer.  Also complains of bringing up scant blood.  Objective:  Vital signs: Blood pressure 125/78, pulse 74, temperature 98.4 F (36.9 C), temperature source Oral, resp. rate 18, height '5\' 11"'$  (1.803 m), weight 41.9 kg, SpO2 99 %.  Physical exam: Constitutional: Frail, cachectic appearing male.  In no apparent distress. HEENT: Tracheostomy. Pulmonary: Normal work of breathing noted Skin: Warm and dry Extremities: Moves all extremities Neuro: Alert and oriented x4   Intake/Output Summary (Last 24 hours) at 08/07/2021 1424 Last data filed at 08/07/2021 0700 Gross per 24 hour  Intake 880 ml  Output --  Net 880 ml    Pertinent Labs:    Latest Ref Rng & Units 07/26/2021    7:50 AM 07/15/2021    3:14 AM 07/08/2021    3:45 AM  CBC  WBC 4.0 - 10.5 K/uL 8.6  5.8  6.3   Hemoglobin 13.0 - 17.0 g/dL 11.9  9.6  9.7   Hematocrit 39.0 - 52.0 % 36.3  29.1  29.8   Platelets 150 - 400 K/uL 273  224  232        Latest Ref Rng & Units 07/24/2021   12:08 PM 07/01/2021    1:48 AM 06/25/2021    9:54 AM  CMP  Glucose 70 - 99 mg/dL 117  101  87   BUN 6 - 20 mg/dL '21  17  13   '$ Creatinine 0.61 - 1.24 mg/dL 0.57  0.43  0.41   Sodium 135 - 145 mmol/L 134  134  131   Potassium 3.5 - 5.1 mmol/L 4.4  4.3  4.2   Chloride 98 - 111 mmol/L 96  98  94   CO2 22 - 32 mmol/L 29  31  32   Calcium 8.9 - 10.3 mg/dL 9.4  8.8  8.5    Assessment/Plan:   Active Problems:   Laryngeal cancer (HCC)   Goals of care, counseling/discussion   Hypotension   Protein-calorie malnutrition, severe   Drug abuse and dependence ([place])   Anxiety state   Supraglottic airway obstruction   Tracheostomy status (HCC)   Pressure injury of skin   S/P percutaneous endoscopic gastrostomy (PEG) tube placement [facility])   Patient Summary: David [name] is a 46 y.o.  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 Hospital course has been complicated by ICU admission requiring tracheostomy, bacteremia, concern for endocarditis, and persistent hypotension. He has completed a 4 week course of IV antibiotics and is stable for discharge once housing is arranged.  Chronic hypotension Blood pressures have remained stable.  Going to try discontinuing midodrine. 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  Encourage patient to get up with PT is much as possible. -Continue PT/OT -Hypertonic nebulizer q8h PRN secretions -Robitussin q6h PRN secretions -Routine trach care per RRT  -Toradol 30 mg IV q6h PRN moderate pain, to be renewed on 08/10/2021. -Chloraseptic spray PRN, lidocaine mouth wash PRN throat irritation  Severe protein calorie malnutrition PEG tube dependent Last weight on 7/3 was 88.9 lbs. admission weight was 93.2 lbs.  Continues to tolerate p.o. intake and nighttime  feeding well. -RD following, appreciate their assistance -Continue night tube feeding -Encourage PO intake as tolerated -Zofran PRN-be cautious with QT given on chronic methadone  Transitions of care Patient has an apartment, but it is awaiting [place] inspection before he is able to move in. -DME hospital bed ordered 06/28. - Signed and returned form [id number] to social work for patient's personal care services upon discharge  Opioid use disorder Chronic methadone use - Continue daily methadone  Anxiety Insomnia Chronic and stable - Continue duloxetine, trazodone nightly, hydroxyzine 100 mg every 6 hours as needed for anxiety  Diet:  thin liquids and night time tube feeds IVF: None,None VTE: Rivaroxaban TOC: apartment awaiting county inspection Code: Full  Dispo: Anticipated discharge  to Home in 1 days pending county inspection of apartment.   [name], MD 08/07/2021, 2:24 PM  Pager: [phone] After 5pm on weekdays and 1pm on weekends: On Call pager: [phone]

## 2021-08-07 NOTE — TOC Progression Note (Signed)
Transition of Care [facility]) - Progression Note    Patient Details  Name: David [name] MRN: [id number] Date of Birth: 31-Aug-1975  Transition of Care [facility]) CM/SW [place], RN Phone Number: 08/07/2021, 1:52 PM  Clinical Narrative:    CM called and spoke with Epimenio Sarin, CM with [place] and the patient's apartment is waiting to be inspected by [facility] at this time.  Inspection of the apartment should be completed by next week - but at this time no date has been determined.  Patient's discharge address will be:  [address] [place], [place], [place] [address] but is unable to be occupied by the patient until inspection.  The patient is receiving a housing voucher that will assist with cost of rent - rent owed by the patient each month will be 274.00 per month along with cost of the electric bill and patient is aware through [facility] communication.  I received PCS Services documentation from the Medical Physician Team and this documentation was faxed to [place] at fax # [phone].  CM and MSW with DTP Team will continue to follow the patient for discharge needs for home - no discharge date has been determined at this time.   Expected Discharge Plan: [place] Barriers to Discharge: Homeless with medical needs  Expected Discharge Plan and Services Expected Discharge Plan: [place] In-house Referral: Clinical Social Work Discharge Planning Services: CM Consult Post Acute Care Choice: [place] arrangements for the past 2 months: Single Family Home                                       Social Determinants of Health (SDOH) Interventions    Readmission Risk Interventions     No data to display

## 2021-08-08 DIAGNOSIS — E43 Unspecified severe protein-calorie malnutrition: Secondary | ICD-10-CM | POA: Diagnosis not present

## 2021-08-08 DIAGNOSIS — Z8521 Personal history of malignant neoplasm of larynx: Secondary | ICD-10-CM | POA: Diagnosis not present

## 2021-08-08 DIAGNOSIS — Z930 Tracheostomy status: Secondary | ICD-10-CM | POA: Diagnosis not present

## 2021-08-08 DIAGNOSIS — J9601 Acute respiratory failure with hypoxia: Secondary | ICD-10-CM | POA: Diagnosis not present

## 2021-08-08 MED ORDER — KETOROLAC TROMETHAMINE 15 MG/ML IJ SOLN
30.0000 mg | Freq: Four times a day (QID) | INTRAMUSCULAR | Status: AC | PRN
  Administered 2021-08-08 – 2021-08-11 (×10): 30 mg via INTRAVENOUS
  Filled 2021-08-08 (×10): qty 2

## 2021-08-08 NOTE — Progress Notes (Signed)
Subjective:   Hospital day 56.  Interval events: None  Patient continues to complain of swelling posterior to angle of right mandible.  Does not seem to be getting rapidly worse.  Reports that Toradol was reordered yesterday the dose was half what he had been getting.  Otherwise no new complaints.  Objective:  Vital signs: Blood pressure 96/63, pulse 88, temperature 98.7 F (37.1 C), resp. rate 18, height '5\' 11"'$  (1.803 m), weight 41.9 kg, SpO2 98 %.  Physical exam: Constitutional: Frail, cachectic appearing male.  In no apparent distress. HEENT: Tracheostomy. Pulmonary: Normal work of breathing noted Skin: Warm and dry Extremities: Moves all extremities Neuro: Alert and oriented x4   Intake/Output Summary (Last 24 hours) at 08/08/2021 0807 Last data filed at 08/07/2021 2200 Gross per 24 hour  Intake 4720 ml  Output 2100 ml  Net 2620 ml    Pertinent Labs:    Latest Ref Rng & Units 07/26/2021    7:50 AM 07/15/2021    3:14 AM 07/08/2021    3:45 AM  CBC  WBC 4.0 - 10.5 K/uL 8.6  5.8  6.3   Hemoglobin 13.0 - 17.0 g/dL 11.9  9.6  9.7   Hematocrit 39.0 - 52.0 % 36.3  29.1  29.8   Platelets 150 - 400 K/uL 273  224  232        Latest Ref Rng & Units 07/24/2021   12:08 PM 07/01/2021    1:48 AM 06/25/2021    9:54 AM  CMP  Glucose 70 - 99 mg/dL 117  101  87   BUN 6 - 20 mg/dL '21  17  13   '$ Creatinine 0.61 - 1.24 mg/dL 0.57  0.43  0.41   Sodium 135 - 145 mmol/L 134  134  131   Potassium 3.5 - 5.1 mmol/L 4.4  4.3  4.2   Chloride 98 - 111 mmol/L 96  98  94   CO2 22 - 32 mmol/L 29  31  32   Calcium 8.9 - 10.3 mg/dL 9.4  8.8  8.5     Assessment/Plan:   Active Problems:   Laryngeal cancer (HCC)   Goals of care, counseling/discussion   Hypotension   Protein-calorie malnutrition, severe   Drug abuse and dependence ([place])   Anxiety state   Supraglottic airway obstruction   Tracheostomy status (HCC)   Pressure injury of skin   S/P percutaneous endoscopic  gastrostomy (PEG) tube placement [facility])   Patient Summary: David [name] is a 46 y.o. 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 Hospital course has been complicated by ICU admission requiring tracheostomy, bacteremia, concern for endocarditis, and persistent hypotension. He has completed a 4 week course of IV antibiotics and is stable for discharge once housing is arranged.  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  Patient states that he is getting up and using the restroom on his own. -Continue PT/OT -Hypertonic nebulizer q8h PRN secretions -Robitussin q6h PRN secretions -Routine trach care per RRT  -Toradol 30 mg IV q6h PRN moderate pain, to be renewed on 08/10/2021. -Chloraseptic spray PRN, lidocaine mouth wash PRN throat irritation  Severe protein calorie malnutrition PEG tube dependent Last weight on 7/3 was 88.9 lbs. admission  weight was 93.2 lbs.  Continues to tolerate p.o. intake and nighttime feeding well. -RD following, appreciate their assistance -Continue night tube feeding -Encourage PO intake as tolerated -Zofran PRN-be cautious with QT given on chronic methadone  Transitions of care [place] for apartment inspection to be completed this week. -DME hospital bed ordered 06/28. - Signed and returned form [id number] to social work for patient's personal care services upon discharge  Opioid use disorder Chronic methadone use - Continue daily methadone  Anxiety Insomnia Chronic and stable - Continue duloxetine, trazodone nightly, hydroxyzine 100 mg every 6 hours as needed for anxiety  Diet:  thin liquids and night time tube feeds IVF: None,None VTE: Rivaroxaban TOC: apartment awaiting county inspection Code: Full  Dispo: Anticipated  discharge to Home in 1 days pending county inspection of apartment.  [name], MD 08/08/2021, 8:07 AM  Pager: [phone] After 5pm on weekdays and 1pm on weekends: On Call pager: [phone]

## 2021-08-08 NOTE — TOC Progression Note (Signed)
Transition of Care [facility]) - Progression Note    Patient Details  Name: David [name] MRN: [id number] Date of Birth: 25-Sep-1975  Transition of Care [facility]) CM/SW [place], RN Phone Number: 08/08/2021, 10:48 AM  Clinical Narrative:    CM met with the patient at the bedside to update the patient that his apartment may be inspected this week or the first of next week according to Epimenio Sarin, [place] at [facility].  He was reminded that he would be responsible for his rent due of 274.00 to move in once available for occupancy.  I discuss with the patient about availability for [place] through [facility] and he was given the provider list to take home for choice regarding company to provide the aide services.  Documents will be faxed within 48 hours of discharge date to home and CM will follow up with the patient/ [facility].  The patient states that [facility] [place], [place] and I updated the patient that medications may be provided through [place] for home and then patient can continue to use [facility] at a different location that his mother can assist with pick up/ delivering along with assistance with groceries for the apartment.  The patient was agreeable with his mother's assistance with transportation.  [employer] - CM and MSW will follow up about delivery of dme to the apartment once the apartment becomes available for occupancy.  CM and MSW with DTP Team will continue to follow the patient for discharge planning needs - no discharge date has been determined at this time.   Expected Discharge Plan: [place] Barriers to Discharge: Homeless with medical needs  Expected Discharge Plan and Services Expected Discharge Plan: [place] In-house Referral: Clinical Social Work Discharge Planning Services: CM Consult Post Acute Care Choice: [place] arrangements for the  past 2 months: Single Family Home                                       Social Determinants of Health (SDOH) Interventions    Readmission Risk Interventions     No data to display

## 2021-08-09 DIAGNOSIS — J9601 Acute respiratory failure with hypoxia: Secondary | ICD-10-CM | POA: Diagnosis not present

## 2021-08-09 NOTE — Plan of Care (Signed)

## 2021-08-09 NOTE — Progress Notes (Signed)
Subjective:   Hospital day 53.  Interval events: None  No new complaints.  Objective:  Vital signs: Blood pressure 104/67, pulse 90, temperature 98 F (36.7 C), resp. rate 20, height '5\' 11"'$  (1.803 m), weight 41.9 kg, SpO2 100 %.  Physical exam: Constitutional: Frail, cachectic appearing male.  In no apparent distress. HEENT: Tracheostomy. Pulmonary: Normal work of breathing noted Skin: Warm and dry Extremities: Moves all extremities Neuro: Alert and oriented x4   Intake/Output Summary (Last 24 hours) at 08/09/2021 1453 Last data filed at 08/09/2021 1300 Gross per 24 hour  Intake 660 ml  Output 200 ml  Net 460 ml    Pertinent Labs:    Latest Ref Rng & Units 07/26/2021    7:50 AM 07/15/2021    3:14 AM 07/08/2021    3:45 AM  CBC  WBC 4.0 - 10.5 K/uL 8.6  5.8  6.3   Hemoglobin 13.0 - 17.0 g/dL 11.9  9.6  9.7   Hematocrit 39.0 - 52.0 % 36.3  29.1  29.8   Platelets 150 - 400 K/uL 273  224  232        Latest Ref Rng & Units 07/24/2021   12:08 PM 07/01/2021    1:48 AM 06/25/2021    9:54 AM  CMP  Glucose 70 - 99 mg/dL 117  101  87   BUN 6 - 20 mg/dL '21  17  13   '$ Creatinine 0.61 - 1.24 mg/dL 0.57  0.43  0.41   Sodium 135 - 145 mmol/L 134  134  131   Potassium 3.5 - 5.1 mmol/L 4.4  4.3  4.2   Chloride 98 - 111 mmol/L 96  98  94   CO2 22 - 32 mmol/L 29  31  32   Calcium 8.9 - 10.3 mg/dL 9.4  8.8  8.5     Assessment/Plan:   Active Problems:   Laryngeal cancer (HCC)   Goals of care, counseling/discussion   Hypotension   Protein-calorie malnutrition, severe   Drug abuse and dependence ([place])   Anxiety state   Supraglottic airway obstruction   Tracheostomy status (HCC)   Pressure injury of skin   S/P percutaneous endoscopic gastrostomy (PEG) tube placement [facility])   Patient Summary: David [name] is a 46 y.o. 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 Hospital course has been complicated by ICU admission requiring tracheostomy, bacteremia, concern for endocarditis, and persistent hypotension. 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 -Continue PT/OT -Hypertonic nebulizer q8h PRN secretions -Robitussin q6h PRN secretions -Routine trach care per RRT  -Toradol 30 mg IV q6h PRN moderate pain, to be renewed on 08/10/2021. -Chloraseptic spray PRN, lidocaine mouth wash PRN throat irritation  Severe protein calorie malnutrition PEG tube dependent Last weight on 7/10 was 92.2 lbs. admission weight was 93.2 lbs.  Continues to tolerate p.o. intake and nighttime feeding well. -RD following, appreciate their assistance -Continue night tube feeding -Encourage PO intake as tolerated -Zofran PRN-be cautious with QT given on chronic methadone  Transitions of care [place] for apartment inspection to be completed this week. -DME hospital bed ordered  06/28. - Signed and returned form [id number] to social work for patient's personal care services upon discharge  Opioid use disorder Chronic methadone use - Continue daily methadone  Anxiety Insomnia Chronic and stable - Continue duloxetine, trazodone nightly, hydroxyzine 100 mg every 6 hours as needed for anxiety   Diet:  thin liquids and night time tube feeds IVF: None,None VTE: Rivaroxaban TOC: apartment awaiting county inspection Code: Full   Dispo: Anticipated discharge to Home in 1 days pending county inspection of apartment.  [name], MD 08/09/2021, 2:53 PM  Pager: [phone] After 5pm on weekdays and 1pm on weekends: On Call pager: [phone]

## 2021-08-10 ENCOUNTER — Inpatient Hospital Stay (HOSPITAL_COMMUNITY): Payer: Medicaid Other

## 2021-08-10 DIAGNOSIS — J9601 Acute respiratory failure with hypoxia: Secondary | ICD-10-CM | POA: Diagnosis not present

## 2021-08-10 LAB — CBC WITH DIFFERENTIAL/PLATELET
Abs Immature Granulocytes: 0.02 10*3/uL (ref 0.00–0.07)
Basophils Absolute: 0 10*3/uL (ref 0.0–0.1)
Basophils Relative: 0 %
Eosinophils Absolute: 0.1 10*3/uL (ref 0.0–0.5)
Eosinophils Relative: 1 %
HCT: 32.2 % — ABNORMAL LOW (ref 39.0–52.0)
Hemoglobin: 10.4 g/dL — ABNORMAL LOW (ref 13.0–17.0)
Immature Granulocytes: 0 %
Lymphocytes Relative: 2 %
Lymphs Abs: 0.1 10*3/uL — ABNORMAL LOW (ref 0.7–4.0)
MCH: 29.5 pg (ref 26.0–34.0)
MCHC: 32.3 g/dL (ref 30.0–36.0)
MCV: 91.5 fL (ref 80.0–100.0)
Monocytes Absolute: 0.2 10*3/uL (ref 0.1–1.0)
Monocytes Relative: 3 %
Neutro Abs: 5.6 10*3/uL (ref 1.7–7.7)
Neutrophils Relative %: 94 %
Platelets: 104 10*3/uL — ABNORMAL LOW (ref 150–400)
RBC: 3.52 MIL/uL — ABNORMAL LOW (ref 4.22–5.81)
RDW: 14.2 % (ref 11.5–15.5)
WBC: 5.9 10*3/uL (ref 4.0–10.5)
nRBC: 0 % (ref 0.0–0.2)

## 2021-08-10 LAB — URINALYSIS, ROUTINE W REFLEX MICROSCOPIC
Bilirubin Urine: NEGATIVE
Glucose, UA: NEGATIVE mg/dL
Hgb urine dipstick: NEGATIVE
Ketones, ur: NEGATIVE mg/dL
Leukocytes,Ua: NEGATIVE
Nitrite: NEGATIVE
Protein, ur: NEGATIVE mg/dL
Specific Gravity, Urine: 1.025 (ref 1.005–1.030)
pH: 6 (ref 5.0–8.0)

## 2021-08-10 LAB — COMPREHENSIVE METABOLIC PANEL
ALT: 68 U/L — ABNORMAL HIGH (ref 0–44)
AST: 88 U/L — ABNORMAL HIGH (ref 15–41)
Albumin: 2.8 g/dL — ABNORMAL LOW (ref 3.5–5.0)
Alkaline Phosphatase: 146 U/L — ABNORMAL HIGH (ref 38–126)
Anion gap: 8 (ref 5–15)
BUN: 31 mg/dL — ABNORMAL HIGH (ref 6–20)
CO2: 26 mmol/L (ref 22–32)
Calcium: 9.3 mg/dL (ref 8.9–10.3)
Chloride: 100 mmol/L (ref 98–111)
Creatinine, Ser: 0.63 mg/dL (ref 0.61–1.24)
GFR, Estimated: >60 mL/min (ref >60)
Glucose, Bld: 109 mg/dL — ABNORMAL HIGH (ref 70–99)
Potassium: 4.9 mmol/L (ref 3.5–5.1)
Sodium: 134 mmol/L — ABNORMAL LOW (ref 135–145)
Total Bilirubin: 0.6 mg/dL (ref 0.3–1.2)
Total Protein: 7.5 g/dL (ref 6.5–8.1)

## 2021-08-10 MED ORDER — KETOROLAC TROMETHAMINE 15 MG/ML IJ SOLN
INTRAMUSCULAR | Status: AC
  Filled 2021-08-10: qty 1

## 2021-08-10 NOTE — TOC Progression Note (Signed)
Transition of Care [facility]) - Progression Note    Patient Details  Name: David [name] MRN: [id number] Date of Birth: Oct 12, 1975  Transition of Care [facility]) CM/SW [place], RN Phone Number: 08/10/2021, 9:56 AM  Clinical Narrative:    CM called and spoke with David [name], CM with [facility] and the patient's apartment has not been inspected at this time.  David [name] states that she sent an email to the housing authority to follow up but is waiting on an answer for scheduled inspection date.  CM and MSW with DTP Team are continuing to follow the patient for discharge planning needs - No discharge date has been determined at this time.   Expected Discharge Plan: [place] Barriers to Discharge: Homeless with medical needs  Expected Discharge Plan and Services Expected Discharge Plan: [place] In-house Referral: Clinical Social Work Discharge Planning Services: CM Consult Post Acute Care Choice: [place] arrangements for the past 2 months: Single Family Home                                       Social Determinants of Health (SDOH) Interventions    Readmission Risk Interventions     No data to display

## 2021-08-10 NOTE — Progress Notes (Addendum)
  Mobility Specialist Criteria Algorithm Info.   08/10/21 1352  Mobility  Activity Refused mobility    Patient declined d/t not feeling well. Will check back as time permits.  08/10/2021 1:52 PM  David [name], [place], [place]  WBLTG:[phone] Office: [phone]

## 2021-08-10 NOTE — Progress Notes (Signed)
Subjective:   Hospital day 65.  Interval events: MD notified of fever to 100.6 F  David [name] is not feeling great today.  He continues to complain of painful swelling around right posterior mandible.  His throat is somewhat sore.  He also complains of some pain around PEG tube insertion site especially when the tube is moved.  He has some new nausea which he attributes to excessive free water flushes overnight.  He endorses some chills overnight.  He does not complain of any new discomfort around tracheotomy.  No new cough.  No shortness of breath.  No chest pain.  No diarrhea.  No dysuria.  Objective:  Vital signs: Blood pressure 111/71, pulse 92, temperature 98.6 F (37 C), temperature source Oral, resp. rate 15, height '5\' 11"'$  (1.803 m), weight 41.9 kg, SpO2 97 %.  Supplemental O2: none  Physical exam: Constitutional: Frail-appearing male in no apparent acute discomfort. HEENT: Soft tissue induration posterior to angle of right mandible.  Mild erythema. Cardiovascular: Tachycardia. Pulmonary: Crackles right lower lobe.  Normal work of breathing noted. Abdominal: Tenderness around site of PEG tube insertion with PEG tube manipulation. Skin: Warm and dry.  No rashes or lesions on visible skin. Extremities: Left radial pulse 2+. Neuro: Alert and oriented. Psych: Appropriate mood and affect.   Intake/Output Summary (Last 24 hours) at 08/10/2021 1218 Last data filed at 08/10/2021 1008 Gross per 24 hour  Intake 4809.33 ml  Output 1300 ml  Net 3509.33 ml    Pertinent Labs:    Latest Ref Rng & Units 08/10/2021   10:25 AM 07/26/2021    7:50 AM 07/15/2021    3:14 AM  CBC  WBC 4.0 - 10.5 K/uL 5.9  8.6  5.8   Hemoglobin 13.0 - 17.0 g/dL 10.4  11.9  9.6   Hematocrit 39.0 - 52.0 % 32.2  36.3  29.1   Platelets 150 - 400 K/uL 104  273  224        Latest Ref Rng & Units 08/10/2021   10:25 AM 07/24/2021   12:08 PM 07/01/2021    1:48 AM  CMP  Glucose 70 - 99 mg/dL 109   117  101   BUN 6 - 20 mg/dL '31  21  17   '$ Creatinine 0.61 - 1.24 mg/dL 0.63  0.57  0.43   Sodium 135 - 145 mmol/L 134  134  134   Potassium 3.5 - 5.1 mmol/L 4.9  4.4  4.3   Chloride 98 - 111 mmol/L 100  96  98   CO2 22 - 32 mmol/L '26  29  31   '$ Calcium 8.9 - 10.3 mg/dL 9.3  9.4  8.8   Total Protein 6.5 - 8.1 g/dL 7.5     Total Bilirubin 0.3 - 1.2 mg/dL 0.6     Alkaline Phos 38 - 126 U/L 146     AST 15 - 41 U/L 88     ALT 0 - 44 U/L 68      UA negative  Imaging: CXR: Interval improvement of right lower lobe consolidation KUB: Small calcifications lateral to left L5 transverse process of uncertain significance  Assessment/Plan:   Active Problems:   Laryngeal cancer (HCC)   Goals of care, counseling/discussion   Hypotension   Protein-calorie malnutrition, severe   Drug abuse and dependence ([place])   Anxiety state   Supraglottic airway obstruction   Tracheostomy status ([place])  Pressure injury of skin   S/P percutaneous endoscopic gastrostomy (PEG) tube placement [facility])   Patient Summary: David [name] is a 46 y.o. 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 Hospital course has been complicated by ICU admission requiring tracheostomy, bacteremia, concern for endocarditis, and persistent hypotension.  Today he developed a fever to 100.6 F.  Fever 100.6 F New fever breaking through scheduled Tylenol and Toradol.  Neck swelling and soreness in the region of known calcified lymph node that has appeared stable on exam from day-to-day, low suspicion for soft tissue infection or neck.  RLL crackles on exam, probably not new, and CXR without new findings.  Pneumonia not ruled out.  Elevated LFTs raises suspicion for cholecystitis, pancreatitis, or metastases.  Also suspect that new thrombocytopenia may be related.  Endocarditis on radar given diagnosis of same early  during this hospitalization (see TTE from 06/08/2021), however this was adequately treated with a 4-week course of IV ceftriaxone. - Blood cultures - RUQ ultrasound - CT neck - Hold Tylenol to help with fever monitoring  Chronic hypotension - Start midodrine 2.5 mg p.o. 3 times daily with meals  History of stage IVB SCC of supraglottic region, postradiation Tracheostomy dependent No changes today. - Continue PT OT - Hypertonic nebs every 8 hours as needed secretions - Robitussin every 6 hours as needed secretions - Routine trach care - Toradol 30 mg IV every 6 hours as needed for moderate pain - Chloraseptic Spray as needed, lidocaine as needed  Severe protein calorie malnutrition PEG tube dependent Weight slowly but surely improving.  Appreciate dietitian input. - Continue night tube feeding - Zofran as needed for nausea  Transitions of care Awaiting apartment inspection.  OUD and chronic methadone use - Continue daily methadone  Anxiety Insomnia Chronic and stable - Continue duloxetine and trazodone and Atarax.  Diet:  thin liquids and night time tube feeds IVF: None,None VTE: Rivaroxaban TOC: apartment awaiting county inspection Code: Full   Dispo: Anticipated discharge to Home in 1 days pending county inspection of apartment.  David Gasser, MD 08/10/2021, 12:18 PM  Pager: [phone] After 5pm on weekdays and 1pm on weekends: On Call pager: [phone]

## 2021-08-10 NOTE — Progress Notes (Signed)
   08/10/21 0824  Assess: MEWS Score  Temp (!) 100.6 F (38.1 C)  BP 129/67  MAP (mmHg) 81  Pulse Rate (!) 123  Resp 19  SpO2 98 %  O2 Device Room Air  Assess: MEWS Score  MEWS Temp 1  MEWS Systolic 0  MEWS Pulse 2  MEWS RR 0  MEWS LOC 0  MEWS Score 3  MEWS Score Color Yellow  Assess: if the MEWS score is Yellow or Red  Were vital signs taken at a resting state? Yes  Focused Assessment No change from prior assessment  Does the patient meet 2 or more of the SIRS criteria? No  MEWS guidelines implemented *See Row Information* Yes  Treat  MEWS Interventions Administered scheduled meds/treatments  Pain Scale 0-10  Pain Score 8  Pain Location Back  Patients Stated Pain Goal 1  Pain Intervention(s) Medication (See eMAR)  Take Vital Signs  Increase Vital Sign Frequency  Yellow: Q 2hr X 2 then Q 4hr X 2, if remains yellow, continue Q 4hrs  Escalate  MEWS: Escalate Yellow: discuss with charge nurse/RN and consider discussing with provider and RRT  Notify: Charge Nurse/RN  Name of Charge Nurse/RN Notified candice RN  Date Charge Nurse/RN Notified 08/10/21  Time Charge Nurse/RN Notified 8088  Notify: Provider  Provider Name/Title Gilles Chiquito MD  Date Provider Notified 08/10/21  Time Provider Notified 0840  Method of Notification Page  Notification Reason Other (Comment)  Provider response En route  Date of Provider Response 08/10/21  Time of Provider Response 0845  Document  Patient Outcome Stabilized after interventions  Assess: SIRS CRITERIA  SIRS Temperature  0  SIRS Pulse 1  SIRS Respirations  0  SIRS WBC 0  SIRS Score Sum  1

## 2021-08-10 NOTE — Progress Notes (Signed)
   08/10/21 1327  Assess: MEWS Score  Temp 98.8 F (37.1 C)  BP 131/71  MAP (mmHg) 89  Pulse Rate 87  Resp 16  SpO2 98 %  O2 Device Room Air  Assess: MEWS Score  MEWS Temp 0  MEWS Systolic 0  MEWS Pulse 0  MEWS RR 0  MEWS LOC 0  MEWS Score 0  MEWS Score Color Green  Assess: if the MEWS score is Yellow or Red  Were vital signs taken at a resting state? Yes  Focused Assessment No change from prior assessment  Does the patient meet 2 or more of the SIRS criteria? No  Treat  MEWS Interventions Administered scheduled meds/treatments  Pain Score Asleep  Document  Patient Outcome Stabilized after interventions  Assess: SIRS CRITERIA  SIRS Temperature  0  SIRS Pulse 0  SIRS Respirations  0  SIRS WBC 0  SIRS Score Sum  0

## 2021-08-11 ENCOUNTER — Inpatient Hospital Stay (HOSPITAL_COMMUNITY): Payer: Medicaid Other

## 2021-08-11 DIAGNOSIS — J9601 Acute respiratory failure with hypoxia: Secondary | ICD-10-CM | POA: Diagnosis not present

## 2021-08-11 LAB — BLOOD CULTURE ID PANEL (REFLEXED) - BCID2

## 2021-08-11 MED ORDER — KETOROLAC TROMETHAMINE 15 MG/ML IJ SOLN
30.0000 mg | Freq: Four times a day (QID) | INTRAMUSCULAR | Status: AC | PRN
  Administered 2021-08-11 – 2021-08-14 (×10): 30 mg via INTRAVENOUS
  Filled 2021-08-11 (×12): qty 2

## 2021-08-11 MED ORDER — SODIUM CHLORIDE 0.9 % IV SOLN
100.0000 mg | INTRAVENOUS | Status: DC
  Administered 2021-08-12 – 2021-08-14 (×3): 100 mg via INTRAVENOUS
  Filled 2021-08-11 (×4): qty 5

## 2021-08-11 MED ORDER — IOHEXOL 300 MG/ML  SOLN
75.0000 mL | Freq: Once | INTRAMUSCULAR | Status: AC | PRN
  Administered 2021-08-11: 75 mL via INTRAVENOUS

## 2021-08-11 NOTE — Progress Notes (Signed)
Pharmacy Antibiotic Note  David [name] is a 46 y.o. male admitted on 06/06/2021 with bacteremia.  Pharmacy has been consulted for micafungin dosing.  Blood culture came back showing yeast with pseudohyphae. WBC 5.9. Scr 0.63 (CrCl 68 mL/min). Afebrile.  Plan: Micafungin 100 mg IV every 24 hours Monitor clinical pic, cx results, and recommendations from ID  Height: '5\' 11"'$  (180.3 cm) (from previous admission) Weight: 41.9 kg (92 lb 4.8 oz) IBW/kg (Calculated) : 75.3  Temp (24hrs), Avg:98.5 F (36.9 C), Min:98.3 F (36.8 C), Max:98.6 F (37 C)  Recent Labs  Lab 08/10/21 1025  WBC 5.9  CREATININE 0.63    Estimated Creatinine Clearance: 68.4 mL/min (by C-G formula based on SCr of 0.63 mg/dL).    No Known Allergies  Antimicrobials this admission: Micafungin 7/15 >>   Dose adjustments this admission: N/A  Microbiology results: 7/14 BCx: yeast with pseudohyphae >> ngtd  Thank you for allowing pharmacy to be a part of this patient's care.  Antonietta Jewel, PharmD, [facility] Clinical Pharmacist  Phone: [phone] 08/11/2021 10:25 PM  Please check AMION for all [place] phone numbers After 10:00 PM, call [place] [phone]

## 2021-08-11 NOTE — Progress Notes (Signed)
   08/10/21 1145  Assess: MEWS Score  Temp 98.6 F (37 C)  BP 111/71  MAP (mmHg) 83  Pulse Rate 92  Resp 15  SpO2 97 %  O2 Device Room Air  Assess: MEWS Score  MEWS Temp 0  MEWS Systolic 0  MEWS Pulse 0  MEWS RR 0  MEWS LOC 0  MEWS Score 0  MEWS Score Color Green  Assess: if the MEWS score is Yellow or Red  Were vital signs taken at a resting state? Yes  Focused Assessment No change from prior assessment  Does the patient meet 2 or more of the SIRS criteria? No  MEWS guidelines implemented *See Row Information* No, previously red, continue vital signs every 4 hours  Treat  MEWS Interventions Administered scheduled meds/treatments  Pain Scale 0-10  Pain Score 8  Pain Location Back  Patients Stated Pain Goal 1  Pain Intervention(s) Food;Relaxation  Document  Patient Outcome Stabilized after interventions  Assess: SIRS CRITERIA  SIRS Temperature  0  SIRS Pulse 1  SIRS Respirations  0  SIRS WBC 0  SIRS Score Sum  1

## 2021-08-11 NOTE — Progress Notes (Signed)
Subjective:   Hospital day 46.  Interval events: None  Patient is feeling better than he was feeling yesterday.  He has no new complaints.  He feels strongly that his fever was due to the swelling posterior to angle of right mandible.  He feels like this is improved since yesterday.  Wants to know when he can get the trach out.  Objective:  Vital signs: Blood pressure 122/65, pulse 84, temperature 98.6 F (37 C), temperature source Oral, resp. rate 18, height '5\' 11"'$  (1.803 m), weight 41.9 kg, SpO2 91 %.  Supplemental O2: None  Physical exam: Constitutional: Frail-appearing male sitting upright in bed.  No apparent discomfort. HEENT: Collared tracheostomy tube in place. Cardiovascular: Regular rate and rhythm. Pulmonary: Crackles right lung.  Normal work of breathing noted. Abdominal: Soft.  Nontender.  Nondistended.  PEG tube left lower quadrant. Skin: Warm and dry.  No rashes or lesions noted on visible skin. Extremities: Left radial pulse 2+.  No pedal edema. Neuro: Alert and oriented. Psych: Appropriate mood and affect.   Intake/Output Summary (Last 24 hours) at 08/11/2021 0925 Last data filed at 08/11/2021 0559 Gross per 24 hour  Intake 120 ml  Output 1210 ml  Net -1090 ml    Pertinent Labs:    Latest Ref Rng & Units 08/10/2021   10:25 AM 07/26/2021    7:50 AM 07/15/2021    3:14 AM  CBC  WBC 4.0 - 10.5 K/uL 5.9  8.6  5.8   Hemoglobin 13.0 - 17.0 g/dL 10.4  11.9  9.6   Hematocrit 39.0 - 52.0 % 32.2  36.3  29.1   Platelets 150 - 400 K/uL 104  273  224        Latest Ref Rng & Units 08/10/2021   10:25 AM 07/24/2021   12:08 PM 07/01/2021    1:48 AM  CMP  Glucose 70 - 99 mg/dL 109  117  101   BUN 6 - 20 mg/dL '31  21  17   '$ Creatinine 0.61 - 1.24 mg/dL 0.63  0.57  0.43   Sodium 135 - 145 mmol/L 134  134  134   Potassium 3.5 - 5.1 mmol/L 4.9  4.4  4.3   Chloride 98 - 111 mmol/L 100  96  98   CO2 22 - 32 mmol/L '26  29  31   '$ Calcium 8.9 - 10.3 mg/dL  9.3  9.4  8.8   Total Protein 6.5 - 8.1 g/dL 7.5     Total Bilirubin 0.3 - 1.2 mg/dL 0.6     Alkaline Phos 38 - 126 U/L 146     AST 15 - 41 U/L 88     ALT 0 - 44 U/L 68      Blood cultures no growth at 24 hours  Imaging: Right upper quadrant ultrasound: Moderate gallbladder distention Scant perihepatic ascites No focal hepatic lesions No acute findings  Assessment/Plan:   Active Problems:   Laryngeal cancer (HCC)   Goals of care, counseling/discussion   Hypotension   Protein-calorie malnutrition, severe   Drug abuse and dependence ([place])   Anxiety state   Supraglottic airway obstruction   Tracheostomy status (HCC)   Pressure injury of skin   S/P percutaneous endoscopic gastrostomy (PEG) tube placement [facility])   Patient Summary: David [name] is a 46 y.o. with a PMH of stage IVb SCC of supraglottic region, opioid use disorder, endocarditis, who  was admitted to the ICU 66 days ago with hypoxic respiratory failure and sepsis due to pneumonia.  Currently with tracheostomy and PEG tube.  Fever Afebrile overnight.  Concerned about for metastasis of SCC.  Awaiting CT neck.  May consider CT chest abdomen pelvis given findings of elevated LFTs, incidental small calcifications on KUB, and negative infectious work-up thus far.  We will hold Tylenol for now to prevent masking fever. - Follow-up CT neck - Continue holding Tylenol  History of stage IVB SCC of supraglottic region, postradiation Tracheostomy dependent No changes today.  Has follow-up with ENT 08/30/2021 for repeat laryngoscopy and evaluation for decannulation. - Continue PT OT - Hypertonic nebs every 8 hours as needed secretions - Robitussin every 6 hours as needed secretions - Routine trach care - Toradol 30 mg IV every 6 hours as needed for moderate pain - Chloraseptic Spray as needed, lidocaine as needed  Severe protein calorie malnutrition PEG tube dependent Weight slowly but surely improving.  Appreciate  dietitian input. - Continue night tube feeding and encourage p.o. intake - Zofran as needed for nausea  Chronic hypotension Holding midodrine because blood pressure is normal.  Transitions of care Awaiting apartment inspection.  Stable for discharge.  OUD and chronic methadone use - Continue daily methadone  Anxiety Insomnia Chronic and stable - Continue duloxetine and trazodone and Atarax.   Diet: Regular with thin liquids and night time tube feeds IVF: None,None VTE: Rivaroxaban TOC: apartment awaiting county inspection Code: Full   Dispo: Anticipated discharge to Home in 1 days pending county inspection of apartment.  [name], MD 08/11/2021, 9:25 AM  Pager: [phone] After 5pm on weekdays and 1pm on weekends: On Call pager: [phone]

## 2021-08-12 ENCOUNTER — Inpatient Hospital Stay (HOSPITAL_COMMUNITY): Payer: Medicaid Other

## 2021-08-12 DIAGNOSIS — R7881 Bacteremia: Secondary | ICD-10-CM

## 2021-08-12 DIAGNOSIS — B377 Candidal sepsis: Secondary | ICD-10-CM

## 2021-08-12 MED ORDER — ACETAMINOPHEN 325 MG PO TABS
650.0000 mg | ORAL_TABLET | Freq: Three times a day (TID) | ORAL | Status: DC
  Administered 2021-08-12 – 2021-08-22 (×28): 650 mg via ORAL
  Filled 2021-08-12 (×34): qty 2

## 2021-08-12 NOTE — Progress Notes (Signed)
  Echocardiogram 2D Echocardiogram has been performed.  David [name] F 08/12/2021, 5:08 PM

## 2021-08-12 NOTE — Progress Notes (Signed)
Pt refusing all help,drawtext ordered and pt placed one around trach, inner cannula changed by PT

## 2021-08-12 NOTE — Progress Notes (Signed)
Subjective:   Hospital day 65.  Interval events: Blood cultures positive for Candida albicans.  Pharmacy to start micafungin IV.  Patient is feeling well today.  No new complaints.  No skin lesions or rashes.  Objective:  Vital signs: Blood pressure (!) 115/95, pulse 84, temperature 98.8 F (37.1 C), temperature source Oral, resp. rate 18, height '5\' 11"'$  (1.803 m), weight 41.9 kg, SpO2 96 %.  Physical exam: Constitutional: Frail-appearing male in no apparent acute discomfort. HEENT: Tracheostomy. Cardiovascular: Regular rate and rhythm. Pulmonary: Crackles right lung.  Normal work of breathing noted. Abdominal: PEG tube. Skin: No new rashes or lesions on visible skin. Extremities: Left radial pulse 2+. Neuro: Alert and oriented.   Intake/Output Summary (Last 24 hours) at 08/12/2021 1324 Last data filed at 08/12/2021 1158 Gross per 24 hour  Intake 1708 ml  Output 1500 ml  Net 208 ml    Pertinent Labs:    Latest Ref Rng & Units 08/10/2021   10:25 AM 07/26/2021    7:50 AM 07/15/2021    3:14 AM  CBC  WBC 4.0 - 10.5 K/uL 5.9  8.6  5.8   Hemoglobin 13.0 - 17.0 g/dL 10.4  11.9  9.6   Hematocrit 39.0 - 52.0 % 32.2  36.3  29.1   Platelets 150 - 400 K/uL 104  273  224        Latest Ref Rng & Units 08/10/2021   10:25 AM 07/24/2021   12:08 PM 07/01/2021    1:48 AM  CMP  Glucose 70 - 99 mg/dL 109  117  101   BUN 6 - 20 mg/dL '31  21  17   '$ Creatinine 0.61 - 1.24 mg/dL 0.63  0.57  0.43   Sodium 135 - 145 mmol/L 134  134  134   Potassium 3.5 - 5.1 mmol/L 4.9  4.4  4.3   Chloride 98 - 111 mmol/L 100  96  98   CO2 22 - 32 mmol/L '26  29  31   '$ Calcium 8.9 - 10.3 mg/dL 9.3  9.4  8.8   Total Protein 6.5 - 8.1 g/dL 7.5     Total Bilirubin 0.3 - 1.2 mg/dL 0.6     Alkaline Phos 38 - 126 U/L 146     AST 15 - 41 U/L 88     ALT 0 - 44 U/L 68      Blood cultures positive for Candida albicans in both aerobic bottles.  Imaging: CT neck: IMPRESSION: 1. Decreased  overall edema within the supraglottic neck and surrounding the tracheostomy tube. 2. No focal fluid collection to suggest abscess or infection. 3. Linear enhancement along the right side of the residual supraglottic and marked irregular enhancement at the level of the false cords tissue is concerning for residual or recurrent tumor. 4. Right level 2 and level 4 lymph nodes are stable to slightly increased in size. 5. No new nodal disease. 6. Improved airspace disease in the posterior right upper lobe.  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 with a PMH of stage IVb SCC supraglottic region, tracheostomy dependence, PEG tube dependence, who presented with airway collapse shock and was admitted for septic shock.  Now with fever and candidemia after being stable for weeks.  Candida albicans fungemia Likely source of fever 2 nights ago.  Patient remains afebrile overnight.  His condition is stable.  No clear source of candidal infection.  Micafungin started.  We will need to resume laboratory monitoring for anemia and hepatic dysfunction which are adverse effects of micafungin treatment.  Infectious disease now following, appreciate their input. - Start micafungin 100 mg IV to be infused over 1 hour, every 24 hours - Every other day CBC and CMP - Follow-up recommendations from infectious disease.  History of stage IVB SCC of supraglottic region, postradiation Tracheostomy dependent No changes today.  Has follow-up with ENT 08/30/2021 for repeat laryngoscopy and evaluation for  decannulation. - Can restart Tylenol 650 mg p.o. 3 times daily for pain since source of fever has been discovered. - Continue PT OT - Hypertonic nebs every 8 hours as needed secretions - Robitussin every 6 hours as needed secretions - Routine trach care - Toradol 30 mg IV every 6 hours as needed for moderate pain - Chloraseptic Spray as needed, lidocaine as needed  Severe protein calorie malnutrition PEG tube dependent Weight slowly but surely improving.  Appreciate dietitian input. - Continue night tube feeding and encourage p.o. intake - Zofran as needed for nausea   Chronic hypotension Holding midodrine because blood pressure is normal.   Transitions of care Awaiting apartment inspection.  OUD and chronic methadone use - Continue daily methadone  Anxiety Insomnia Chronic and stable - Continue duloxetine and trazodone and Atarax.   Diet: Regular with thin liquids and night time tube feeds IVF: None,None VTE: Rivaroxaban TOC: apartment awaiting county inspection Code: Full   Dispo: Anticipated discharge to Home in 1 days pending county inspection of apartment.  [name], MD 08/12/2021, 1:24 PM  Pager: [phone] After 5pm on weekdays and 1pm on weekends: On Call pager: [phone]

## 2021-08-12 NOTE — Consult Note (Addendum)
[facility] for Infectious Disease  Date of Admission:  06/06/2021     Principal Problem:   Candidemia ([place]) Active Problems:   Laryngeal cancer ([place])   Goals of care, counseling/discussion   Hypotension   Protein-calorie malnutrition, severe   Drug abuse and dependence ([place])   Anxiety state   Supraglottic airway obstruction   Tracheostomy status (HCC)   Pressure injury of skin   S/P percutaneous endoscopic gastrostomy (PEG) tube placement ([place])          Assessment: 54 YM with a prolonged hospital stay, initially admitted with respiratory failure and sepsis  2/2 PNA. 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 ID was engaged and pt placed on ctx x 1 month. Hospital course complicated by acute respiratory failure requiring tracheostomy. Pt developed fevers and blood Cx+ candida albicans.   #Candida albicans+ blood Cx #Stage Ivb SCC of supraglottic region  SP trach on 06/09/21 #SP PEG #Prolonged hospitalization -Source is unclear, but I suspect trach site as there is increased drainage and erythema below trach opening. PIV was replaced. PEG in place without concern for surrounding infection.  Recommendations:  -Start micafungin, In the setting of MV vegetation I think we may need prolonged anti-fungals(4weeks). Pending if repeat Cx are negative and CT neck findings we can likely transition to fluconazole.  -TTE -CT neck to evaluate trach site -Repeat blood Cx -Obtain fungal Cx -Follow blood Cx -Opthalmology in the setting of fungemia   #Chronic HCV -follow-up outpatient  #Streptococcus pneumoniae bacteremia(POA) with Native MV IE #Hx of IVDA -TTE c.f small mitral valve vegetation. NO a candidate for TEE.  -CTX x4 weeks form negative Cx (ended on 6/9) Microbiology:   Antibiotics: Micafungin 7/15-  Blood 51/10 1/2 strep pneumoniae 7/12 2/2 Candida albicans SUBJECTIVE: Resting in bed. Reports trach site drainage has  increased. Interval: Afebrile overnight. Wbc 5.9k  Review of Systems: Review of Systems  All other systems reviewed and are negative.    Scheduled Meds:  acetaminophen  650 mg Oral TID   chlorhexidine gluconate (MEDLINE KIT)  15 mL Mouth Rinse BID   Chlorhexidine Gluconate Cloth  6 each Topical Daily   DULoxetine  60 mg Oral Daily   feeding supplement (OSMOLITE 1.5 CAL)  1,000 mL Per Tube Q24H   feeding supplement (PROSource TF)  45 mL Per Tube TID   folic acid  1 mg Per Tube Daily   free water  125 mL Per Tube Q6H   lidocaine  1 patch Transdermal Q24H   methadone  50 mg Per Tube Daily   multivitamin with minerals  1 tablet Per Tube Daily   nicotine  14 mg Transdermal Daily   pantoprazole sodium  40 mg Per Tube Daily   polyethylene glycol  17 g Per Tube Daily   rivaroxaban  10 mg Per Tube Daily   sennosides  5 mL Per Tube BID   thiamine  100 mg Per Tube Daily   traZODone  100 mg Per Tube QHS   Continuous Infusions:  micafungin (MYCAMINE) 100 mg in sodium chloride 0.9 % 100 mL IVPB 100 mg (08/12/21 0028)   PRN Meds:.diclofenac Sodium, guaiFENesin, hydrOXYzine, ketorolac, lidocaine, melatonin, ondansetron (ZOFRAN) IV, oxyCODONE, phenol, sodium chloride, sodium chloride flush, Zinc Oxide No Known Allergies  OBJECTIVE: Vitals:   08/12/21 0500 08/12/21 0744 08/12/21 0915 08/12/21 1215  BP:   (!) 115/95   Pulse: 88 99 83 84  Resp: 20 20 17  18  Temp:   98.8 F (37.1 C)   TempSrc:   Oral   SpO2: 99% 97% 99% 96%  Weight:      Height:       Body mass index is 12.87 kg/m.  Physical Exam Constitutional:      General: He is not in acute distress.    Appearance: He is normal weight. He is not toxic-appearing.     Comments: Trach and peg. Mild erythema at trach site  HENT:     Head: Normocephalic and atraumatic.     Right Ear: External ear normal.     Left Ear: External ear normal.     Nose: No congestion or rhinorrhea.     Mouth/Throat:     Mouth: Mucous membranes are  moist.     Pharynx: Oropharynx is clear.  Eyes:     Extraocular Movements: Extraocular movements intact.     Conjunctiva/sclera: Conjunctivae normal.     Pupils: Pupils are equal, round, and reactive to light.  Cardiovascular:     Rate and Rhythm: Normal rate and regular rhythm.     Heart sounds: No murmur heard.    No friction rub. No gallop.  Pulmonary:     Effort: Pulmonary effort is normal.     Breath sounds: Normal breath sounds.  Abdominal:     General: Abdomen is flat. Bowel sounds are normal.     Palpations: Abdomen is soft.  Musculoskeletal:        General: No swelling. Normal range of motion.     Cervical back: Normal range of motion and neck supple.  Skin:    General: Skin is warm and dry.  Neurological:     General: No focal deficit present.     Mental Status: He is oriented to person, place, and time.  Psychiatric:        Mood and Affect: Mood normal.       Lab Results Lab Results  Component Value Date   WBC 5.9 08/10/2021   HGB 10.4 (L) 08/10/2021   HCT 32.2 (L) 08/10/2021   MCV 91.5 08/10/2021   PLT 104 (L) 08/10/2021    Lab Results  Component Value Date   CREATININE 0.63 08/10/2021   BUN 31 (H) 08/10/2021   NA 134 (L) 08/10/2021   K 4.9 08/10/2021   CL 100 08/10/2021   CO2 26 08/10/2021    Lab Results  Component Value Date   ALT 68 (H) 08/10/2021   AST 88 (H) 08/10/2021   ALKPHOS 146 (H) 08/10/2021   BILITOT 0.6 08/10/2021        Laurice Record, [place] for Infectious Disease [facility] 08/12/2021, 2:49 PM

## 2021-08-13 DIAGNOSIS — B377 Candidal sepsis: Secondary | ICD-10-CM | POA: Diagnosis not present

## 2021-08-13 DIAGNOSIS — B376 Candidal endocarditis: Secondary | ICD-10-CM | POA: Diagnosis present

## 2021-08-13 LAB — COMPREHENSIVE METABOLIC PANEL
ALT: 49 U/L — ABNORMAL HIGH (ref 0–44)
AST: 48 U/L — ABNORMAL HIGH (ref 15–41)
Albumin: 2.6 g/dL — ABNORMAL LOW (ref 3.5–5.0)
Alkaline Phosphatase: 93 U/L (ref 38–126)
Anion gap: 12 (ref 5–15)
BUN: 29 mg/dL — ABNORMAL HIGH (ref 6–20)
CO2: 25 mmol/L (ref 22–32)
Calcium: 9.1 mg/dL (ref 8.9–10.3)
Chloride: 95 mmol/L — ABNORMAL LOW (ref 98–111)
Creatinine, Ser: 0.58 mg/dL — ABNORMAL LOW (ref 0.61–1.24)
GFR, Estimated: >60 mL/min (ref >60)
Glucose, Bld: 107 mg/dL — ABNORMAL HIGH (ref 70–99)
Potassium: 4.8 mmol/L (ref 3.5–5.1)
Sodium: 132 mmol/L — ABNORMAL LOW (ref 135–145)
Total Bilirubin: 0.5 mg/dL (ref 0.3–1.2)
Total Protein: 6.8 g/dL (ref 6.5–8.1)

## 2021-08-13 LAB — CBC
HCT: 30.3 % — ABNORMAL LOW (ref 39.0–52.0)
Hemoglobin: 9.9 g/dL — ABNORMAL LOW (ref 13.0–17.0)
MCH: 29.3 pg (ref 26.0–34.0)
MCHC: 32.7 g/dL (ref 30.0–36.0)
MCV: 89.6 fL (ref 80.0–100.0)
Platelets: 136 10*3/uL — ABNORMAL LOW (ref 150–400)
RBC: 3.38 MIL/uL — ABNORMAL LOW (ref 4.22–5.81)
RDW: 13.7 % (ref 11.5–15.5)
WBC: 7.1 10*3/uL (ref 4.0–10.5)
nRBC: 0 % (ref 0.0–0.2)

## 2021-08-13 NOTE — TOC Progression Note (Addendum)
Transition of Care [facility]) - Progression Note    Patient Details  Name: David [name] MRN: [id number] Date of Birth: 1975-08-03  Transition of [facility]) CM/SW Contact  Janae Bridgeman, RN Phone Number: 08/13/2021, 1:57 PM  Clinical Narrative:    CM called and spoke with David [name], CM at [facility] and the patient's apartment is still on hold to be inspected.  No discharge date has been determined at this time.  Dr. Benito Mccreedy was updated.  08/13/2021 1437 - CM spoke with Dr. Benito Mccreedy and the patient was seen by infectious disease MD and will need PICC line and treatment with IV antifungal medications.  The patient is not a candidate of return to home with a PICC line since has past history of IV Heroine abuse in the past  - so will need to remain hospitalized for this treatment until PICC line is no longer needed.  I called and spoke with David [name], CM at [facility] and she states that when the apartment is inspected  - she will bring the lease to the patient's hospital room so that he can sign the lease agreement and pay for the apartment until he is able to be discharged from the hospital.  CM and MSW with DTP Team will continue to follow the patient for discharge planning - no discharge date determined at this time.   Expected Discharge Plan: Home w Home Health Services Barriers to Discharge: Homeless with medical needs  Expected Discharge Plan and Services Expected Discharge Plan: Home w Home Health Services In-house Referral: Clinical Social Work Discharge Planning Services: CM Consult Post Acute Care Choice: Home Health Living arrangements for the past 2 months: Single Family Home                                       Social Determinants of Health (SDOH) Interventions    Readmission Risk Interventions     No data to display

## 2021-08-13 NOTE — Progress Notes (Signed)
Subjective:   Hospital day 65.  Interval events: No events overnight  Feeling well today.  No new subjective fevers.  No new complaints.  Objective:  Vital signs: Blood pressure 137/89, pulse 79, temperature 98.7 F (37.1 C), temperature source Oral, resp. rate 16, height '5\' 11"'$  (1.803 m), weight 41.9 kg, SpO2 96 %.  Physical exam: Constitutional: Frail-appearing male lying in bed.  No apparent acute discomfort. HEENT: Tracheostomy in place. Abdomen: PEG tube left lower quadrant. Skin: No rashes or lesions on visible skin. Neuro: Alert and oriented. Psych: Appropriate mood and affect.   Intake/Output Summary (Last 24 hours) at 08/13/2021 1224 Last data filed at 08/13/2021 0900 Gross per 24 hour  Intake 1679.95 ml  Output 1275 ml  Net 404.95 ml    Pertinent Labs:    Latest Ref Rng & Units 08/13/2021    3:01 AM 08/10/2021   10:25 AM 07/26/2021    7:50 AM  CBC  WBC 4.0 - 10.5 K/uL 7.1  5.9  8.6   Hemoglobin 13.0 - 17.0 g/dL 9.9  10.4  11.9   Hematocrit 39.0 - 52.0 % 30.3  32.2  36.3   Platelets 150 - 400 K/uL 136  104  273        Latest Ref Rng & Units 08/13/2021    3:01 AM 08/10/2021   10:25 AM 07/24/2021   12:08 PM  CMP  Glucose 70 - 99 mg/dL 107  109  117   BUN 6 - 20 mg/dL '29  31  21   '$ Creatinine 0.61 - 1.24 mg/dL 0.58  0.63  0.57   Sodium 135 - 145 mmol/L 132  134  134   Potassium 3.5 - 5.1 mmol/L 4.8  4.9  4.4   Chloride 98 - 111 mmol/L 95  100  96   CO2 22 - 32 mmol/L '25  26  29   '$ Calcium 8.9 - 10.3 mg/dL 9.1  9.3  9.4   Total Protein 6.5 - 8.1 g/dL 6.8  7.5    Total Bilirubin 0.3 - 1.2 mg/dL 0.5  0.6    Alkaline Phos 38 - 126 U/L 93  146    AST 15 - 41 U/L 48  88    ALT 0 - 44 U/L 49  68     Repeat blood cultures no growth at 24 hours.  TTE: Nondiagnostic study due to poor windows. Large highly mobile density measuring 1.2 cm x 1.1 cm on the tricuspid valve highly concerning for vegetation.  This is new when compared to prior TTE  on 06/08/2021.  Assessment/Plan:   Principal Problem:   Candidemia ([place]) Active Problems:   Laryngeal cancer (HCC)   Goals of care, counseling/discussion   Hypotension   Protein-calorie malnutrition, severe   Drug abuse and dependence ([place])   Anxiety state   Supraglottic airway obstruction   Tracheostomy status (HCC)   Pressure injury of skin   S/P percutaneous endoscopic gastrostomy (PEG) tube placement [facility])   Patient Summary: Penny Frisbie is a 46 y.o. 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  Hospital course complicated by endocarditis and candidemia.  Candida albicans fungemia Stable and afebrile.  No clear source of candidal infection.  Yesterday's echo was notable for a possible tricuspid valve vegetation, not seen  on 06/08/2021.  To evaluate this further patient may need a TEE.  May need prolonged course of IV micafungin.  Ophthalmologic examination is recommended to evaluate for endophthalmitis.  Infectious disease now following, appreciate their input. - Continue micafungin 100 mg IV to be infused over 1 hour, every 24 hours. - Every other day CBC and CMP for micafungin adverse effect monitoring. - Follow-up repeat blood cultures. - Follow-up fungal culture. - Consult ophthalmology.  History of stage IVB SCC of supraglottic region, postradiation Tracheostomy dependent No changes today.  Has follow-up with ENT 08/30/2021 for repeat laryngoscopy and evaluation for decannulation. - Continue Tylenol 650 mg p.o. 3 times daily for pain - Continue PT OT - Hypertonic nebs every 8 hours as needed secretions - Robitussin every 6 hours as needed secretions - Routine trach care - Toradol 30 mg IV every 6 hours as needed for moderate pain - Chloraseptic Spray as needed, lidocaine as needed  Severe protein calorie malnutrition PEG  tube dependent Weight slowly but surely improving.  Appreciate dietitian input. - Continue night tube feeding and encourage p.o. intake - Zofran as needed for nausea   Chronic hypotension Holding midodrine because blood pressure is normal.   Transitions of care Awaiting apartment inspection.  OUD and chronic methadone use - Continue daily methadone  Anxiety Insomnia Chronic and stable - Continue duloxetine, trazodone, and Atarax.  Diet: Regular with thin liquids and night time tube feeds IVF: None,None VTE: Rivaroxaban TOC: apartment awaiting county inspection Code: Full   Dispo: Anticipated discharge to Home in 1 days pending county inspection of apartment.  [name], MD 08/13/2021, 12:24 PM  Pager: [phone] After 5pm on weekdays and 1pm on weekends: On Call pager: [phone]

## 2021-08-13 NOTE — Progress Notes (Signed)
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 No evidence of active disease at this time per patient  7/16 tte with new tv vegetation 7/15 neck soft tissue ct no abscess/fluid collection around trach site    PLAN: Pending repeat 7/16 blood cx, if negative will plan to transition to fluconazole. Given vegetation anticipate months of treatment for presumed endocarditis At this time continue micafungin Will need outpatient ophthalmologic eye exam Discussed with primary team   I spent 50 minute reviewing data/chart, and coordinating care and >50% direct face to face time providing counseling/discussing diagnostics/treatment plan with patient   Principal Problem:   Candidemia ([place]) Active Problems:   Laryngeal cancer ([place])   Goals of care, counseling/discussion   Hypotension   Protein-calorie malnutrition, severe   Drug abuse and dependence ([place])   Anxiety state   Supraglottic airway obstruction   Tracheostomy status ([place])   Pressure injury of skin   S/P percutaneous endoscopic gastrostomy (PEG) tube placement ([place])   No Known Allergies  Scheduled Meds:  acetaminophen  650 mg Oral TID   chlorhexidine gluconate (MEDLINE KIT)  15 mL Mouth Rinse BID   Chlorhexidine Gluconate Cloth  6 each Topical Daily   DULoxetine  60 mg Oral Daily   feeding supplement (OSMOLITE 1.5 CAL)  1,000 mL Per Tube Q24H   feeding supplement (PROSource TF)  45 mL Per Tube TID   folic acid  1 mg Per Tube Daily   free water  125 mL Per Tube Q6H   lidocaine  1 patch Transdermal Q24H   methadone  50 mg Per Tube Daily   multivitamin  with minerals  1 tablet Per Tube Daily   nicotine  14 mg Transdermal Daily   pantoprazole sodium  40 mg Per Tube Daily   polyethylene glycol  17 g Per Tube Daily   rivaroxaban  10 mg Per Tube Daily   sennosides  5 mL Per Tube BID   thiamine  100 mg Per Tube Daily   traZODone  100 mg Per Tube QHS   Continuous Infusions:  micafungin (MYCAMINE) 100 mg in sodium chloride 0.9 % 100 mL IVPB 100 mg (08/12/21 2114)   PRN Meds:.diclofenac Sodium, guaiFENesin, hydrOXYzine, ketorolac, lidocaine, melatonin, ondansetron (ZOFRAN) IV, oxyCODONE, phenol, sodium chloride, sodium chloride flush, Zinc Oxide   SUBJECTIVE: Reviewed oncologic history with patient  He is currently still inpatient due to housing arrangement issue, and of course most recently fever with fungemia and echo finding of tv vegetation  7/16 bcx repeat ngtd Felt well  No n/v/diarrhea No back/peripheral joint pain/swelling   Review of Systems: ROS All other ROS was negative, except mentioned above     OBJECTIVE: Vitals:   08/13/21 0452 08/13/21 0551 08/13/21 0735 08/13/21 0838  BP:  125/75  137/89  Pulse:  83 87 83  Resp:  _0 Temp:  98.8 F (37.1 C)  98.7 F (37.1 C)  TempSrc:  Oral  Oral  SpO2: 99% 97% 96% 96%  Weight:      Height:       Body mass index is 12.87 kg/m.  Physical  Exam  General/constitutional: no distress, pleasant; verbal with trach occluded; thin HEENT: Normocephalic, PER, Conj Clear, EOMI, Oropharynx clear Neck trach site clean/dry no purulence/erythema CV: rrr no mrg Lungs: clear to auscultation, normal respiratory effort Abd: Soft, Nontender; peg site clean/dry no purulence/erythema Ext: no edema Skin: No Rash Neuro: nonfocal MSK: no peripheral joint swelling/tenderness/warmth; back spines nontender    Lab Results Lab Results  Component Value Date   WBC 7.1 08/13/2021   HGB 9.9 (L) 08/13/2021   HCT 30.3 (L) 08/13/2021   MCV 89.6 08/13/2021   PLT 136 (L) 08/13/2021     Lab Results  Component Value Date   CREATININE 0.58 (L) 08/13/2021   BUN 29 (H) 08/13/2021   NA 132 (L) 08/13/2021   K 4.8 08/13/2021   CL 95 (L) 08/13/2021   CO2 25 08/13/2021    Lab Results  Component Value Date   ALT 49 (H) 08/13/2021   AST 48 (H) 08/13/2021   ALKPHOS 93 08/13/2021   BILITOT 0.5 08/13/2021      Microbiology: Recent Results (from the past 240 hour(s))  Culture, blood (Routine X 2) w Reflex to ID Panel     Status: Abnormal (Preliminary result)   Collection Time: 08/10/21 10:25 AM   Specimen: BLOOD LEFT FOREARM  Result Value Ref Range Status   Specimen Description BLOOD LEFT FOREARM  Final   Special Requests   Final    BOTTLES DRAWN AEROBIC AND ANAEROBIC Blood Culture adequate volume   Culture  Setup Time   Final    YEAST WITH PSEUDOHYPHAE AEROBIC BOTTLE ONLY CRITICAL RESULT CALLED TO, READ BACK BY AND VERIFIED WITH: G ABBOTT,[email or web address]  08/11/21 [place]    Culture (A)  Final    CANDIDA ALBICANS CULTURE REINCUBATED FOR BETTER GROWTH Performed at [facility], [address], [place], [place] [address]    Report Status PENDING  Incomplete  Culture, blood (Routine X 2) w Reflex to ID Panel     Status: Abnormal (Preliminary result)   Collection Time: 08/10/21 10:25 AM   Specimen: BLOOD LEFT WRIST  Result Value Ref Range Status   Specimen Description BLOOD LEFT WRIST  Final   Special Requests   Final    BOTTLES DRAWN AEROBIC AND ANAEROBIC Blood Culture results may not be optimal due to an inadequate volume of blood received in culture bottles   Culture  Setup Time   Final    YEAST WITH PSEUDOHYPHAE AEROBIC BOTTLE ONLY CRITICAL VALUE NOTED.  VALUE IS CONSISTENT WITH PREVIOUSLY REPORTED AND CALLED VALUE. Performed at [facility], [place] [address], [place],  [address]    Culture CANDIDA ALBICANS (A)  Final   Report Status PENDING  Incomplete  Blood Culture ID Panel (Reflexed)     Status: Abnormal   Collection Time: 08/10/21 10:25 AM   Result Value Ref Range Status   Enterococcus faecalis NOT DETECTED NOT DETECTED Final   Enterococcus Faecium NOT DETECTED NOT DETECTED Final   Listeria monocytogenes NOT DETECTED NOT DETECTED Final   Staphylococcus species NOT DETECTED NOT DETECTED Final   Staphylococcus aureus (BCID) NOT DETECTED NOT DETECTED Final   Staphylococcus epidermidis NOT DETECTED NOT DETECTED Final   Staphylococcus lugdunensis NOT DETECTED NOT DETECTED Final   Streptococcus species NOT DETECTED NOT DETECTED Final   Streptococcus agalactiae NOT DETECTED NOT DETECTED Final   Streptococcus pneumoniae NOT DETECTED NOT DETECTED Final   Streptococcus pyogenes NOT DETECTED NOT DETECTED Final   A.calcoaceticus-baumannii NOT DETECTED NOT DETECTED Final   Bacteroides  fragilis NOT DETECTED NOT DETECTED Final   Enterobacterales NOT DETECTED NOT DETECTED Final   Enterobacter cloacae complex NOT DETECTED NOT DETECTED Final   Escherichia coli NOT DETECTED NOT DETECTED Final   Klebsiella aerogenes NOT DETECTED NOT DETECTED Final   Klebsiella oxytoca NOT DETECTED NOT DETECTED Final   Klebsiella pneumoniae NOT DETECTED NOT DETECTED Final   Proteus species NOT DETECTED NOT DETECTED Final   Salmonella species NOT DETECTED NOT DETECTED Final   Serratia marcescens NOT DETECTED NOT DETECTED Final   Haemophilus influenzae NOT DETECTED NOT DETECTED Final   Neisseria meningitidis NOT DETECTED NOT DETECTED Final   Pseudomonas aeruginosa NOT DETECTED NOT DETECTED Final   Stenotrophomonas maltophilia NOT DETECTED NOT DETECTED Final   Candida albicans DETECTED (A) NOT DETECTED Final    Comment: CRITICAL RESULT CALLED TO, READ BACK BY AND VERIFIED WITH: G ABBOTT,[email or web address]  08/11/21 [place]    Candida auris NOT DETECTED NOT DETECTED Final   Candida glabrata NOT DETECTED NOT DETECTED Final   Candida krusei NOT DETECTED NOT DETECTED Final   Candida parapsilosis NOT DETECTED NOT DETECTED Final   Candida tropicalis NOT DETECTED NOT DETECTED  Final   Cryptococcus neoformans/gattii NOT DETECTED NOT DETECTED Final    Comment: Performed at [facility], [address], [place], [place] [address]  Culture, blood (Routine X 2) w Reflex to ID Panel     Status: None (Preliminary result)   Collection Time: 08/12/21  3:29 PM   Specimen: BLOOD RIGHT HAND  Result Value Ref Range Status   Specimen Description BLOOD RIGHT HAND  Final   Special Requests   Final    BOTTLES DRAWN AEROBIC AND ANAEROBIC Blood Culture adequate volume   Culture   Final    NO GROWTH < 24 HOURS Performed at [facility], [place] [address], [place], [place] [address]    Report Status PENDING  Incomplete  Culture, blood (Routine X 2) w Reflex to ID Panel     Status: None (Preliminary result)   Collection Time: 08/12/21  3:30 PM   Specimen: BLOOD LEFT HAND  Result Value Ref Range Status   Specimen Description BLOOD LEFT HAND  Final   Special Requests   Final    BOTTLES DRAWN AEROBIC AND ANAEROBIC Blood Culture adequate volume   Culture   Final    NO GROWTH < 24 HOURS Performed at [facility], [place] [address], [place], [place] [address]    Report Status PENDING  Incomplete     Serology:   Imaging: If present, new imagings (plain films, ct scans, and mri) have been personally visualized and interpreted; radiology reports have been reviewed. Decision making incorporated into the Impression / Recommendations.   Jabier Mutton, [place] for Infectious [place] [phone] pager    08/13/2021, 11:40 AM

## 2021-08-14 DIAGNOSIS — B376 Candidal endocarditis: Secondary | ICD-10-CM | POA: Diagnosis not present

## 2021-08-14 MED ORDER — KETOROLAC TROMETHAMINE 15 MG/ML IJ SOLN
30.0000 mg | Freq: Four times a day (QID) | INTRAMUSCULAR | Status: AC | PRN
  Administered 2021-08-14 – 2021-08-16 (×6): 30 mg via INTRAVENOUS
  Filled 2021-08-14 (×8): qty 2

## 2021-08-14 MED ORDER — SODIUM CHLORIDE 0.9 % IV SOLN
150.0000 mg | INTRAVENOUS | Status: DC
  Administered 2021-08-14 – 2021-08-20 (×5): 150 mg via INTRAVENOUS
  Filled 2021-08-14 (×9): qty 7.5

## 2021-08-14 NOTE — TOC Progression Note (Addendum)
Transition of Care [facility]) - Progression Note    Patient Details  Name: David [name] MRN: [id number] Date of Birth: 23-Nov-1975  Transition of Care [facility]) CM/SW [place], RN Phone Number: 08/14/2021, 9:36 AM  Clinical Narrative:    CM met with the patient at the bedside and discussed with the patient that I spoke with Epimenio Sarin, CM at [facility] yesterday and that his apartment was still pending for inspection by [facility] at this time.  I discussed with the patient that he is currently receiving IV antifungals and may likely need a PICC line as requested by medicine.  I explained to the patient that he will be unable to go home with a PICC line due to history of substance abuse and would have to remain hospitalized while he is on IV medications for infection, but that [employer] would provide him with his apartment lease agreement to assist and hold the patient's apartment.  The patient was made aware that he will be able to discharge to his apartment once it is available and he is medically stable and not requiring IV medications nor PICC line for safety reasons.  [facility] Follow up information was placed in the discharge instructions since the patient had previously discuss continuing services and treatment at the clinic.  CM and MSW with DTP Team will continue to follow the patient for discharge planning needs.   Expected Discharge Plan: [place] Barriers to Discharge: Homeless with medical needs  Expected Discharge Plan and Services Expected Discharge Plan: [place] In-house Referral: Clinical Social Work Discharge Planning Services: CM Consult Post Acute Care Choice: [place] arrangements for the past 2 months: Single Family Home                                       Social Determinants of Health (SDOH) Interventions    Readmission Risk Interventions     No data to  display

## 2021-08-14 NOTE — Progress Notes (Signed)
[facility]) Hospital Liaison note:  This patient has been referred to [facility] Outpatient Palliative Care services. Will continue to follow for disposition.  Please call with any outpatient palliative questions or concerns.  Thank you for the opportunity to participate in this patient's care.   Thank you, Lorelee Market, LPN [facility] Hospital Liaison [phone]

## 2021-08-14 NOTE — Progress Notes (Addendum)
Subjective:   Hospital day 21.  Interval events: No interval events.  Spirits are low given his current state of health and hospital complications.  He has no new physical complaints.  Objective:  Vital signs: Blood pressure (!) 148/78, pulse 97, temperature 99 F (37.2 C), temperature source Oral, resp. rate 17, height '5\' 11"'$  (1.803 m), weight 41.9 kg, SpO2 97 %.  Physical exam: Constitutional: Frail-appearing male lying in bed.  No apparent acute discomfort. HEENT: Tracheostomy in place. Pulmonary: Normal work of breathing noted. Abdomen: PEG tube left lower quadrant. Skin: No rashes or lesions on visible skin. Neuro: Alert and oriented. Psych: Appropriate mood and affect.   Intake/Output Summary (Last 24 hours) at 08/14/2021 0600 Last data filed at 08/14/2021 0424 Gross per 24 hour  Intake 3905 ml  Output 2350 ml  Net 1555 ml    Pertinent Labs:    Latest Ref Rng & Units 08/13/2021    3:01 AM 08/10/2021   10:25 AM 07/26/2021    7:50 AM  CBC  WBC 4.0 - 10.5 K/uL 7.1  5.9  8.6   Hemoglobin 13.0 - 17.0 g/dL 9.9  10.4  11.9   Hematocrit 39.0 - 52.0 % 30.3  32.2  36.3   Platelets 150 - 400 K/uL 136  104  273        Latest Ref Rng & Units 08/13/2021    3:01 AM 08/10/2021   10:25 AM 07/24/2021   12:08 PM  CMP  Glucose 70 - 99 mg/dL 107  109  117   BUN 6 - 20 mg/dL '29  31  21   '$ Creatinine 0.61 - 1.24 mg/dL 0.58  0.63  0.57   Sodium 135 - 145 mmol/L 132  134  134   Potassium 3.5 - 5.1 mmol/L 4.8  4.9  4.4   Chloride 98 - 111 mmol/L 95  100  96   CO2 22 - 32 mmol/L '25  26  29   '$ Calcium 8.9 - 10.3 mg/dL 9.1  9.3  9.4   Total Protein 6.5 - 8.1 g/dL 6.8  7.5    Total Bilirubin 0.3 - 1.2 mg/dL 0.5  0.6    Alkaline Phos 38 - 126 U/L 93  146    AST 15 - 41 U/L 48  88    ALT 0 - 44 U/L 49  68     Blood cultures from 08/12/2021 with no growth to date  Assessment/Plan:   Principal Problem:   Candidemia ([place]) Active Problems:   Laryngeal cancer  (HCC)   Goals of care, counseling/discussion   Protein-calorie malnutrition, severe   Tracheostomy status ([place])   Pressure injury of skin   S/P percutaneous endoscopic gastrostomy (PEG) tube placement ([place])   Candidal endocarditis   Patient Summary: David [name] is a 46 y.o. 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  Hospital course complicated by endocarditis and candidemia.  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  Patient with large tricuspid valve vegetation first noticed on 08/12/2021 echo.  Anticipate 4 weeks of treatment with IV micafungin followed by a period of oral fluconazole.  Appreciate input from infectious disease. - Continue micafungin 100 mg IV to be infused over 1 hour, every 24 hours. - Follow-up repeat blood  cultures. - Follow-up fungal culture. - Outpatient ophthalmology follow-up  History of stage IVB SCC of supraglottic region, postradiation Tracheostomy dependent No changes today.  Has follow-up with ENT 08/30/2021 for repeat laryngoscopy and evaluation for decannulation. - Continue Tylenol 650 mg p.o. 3 times daily for pain - Continue PT OT - Hypertonic nebs every 8 hours as needed secretions - Robitussin every 6 hours as needed secretions - Routine trach care - Toradol 30 mg IV every 6 hours as needed for moderate pain - Chloraseptic Spray as needed, lidocaine as needed  Severe protein calorie malnutrition PEG tube dependent Weight slowly but surely improving.  Appreciate dietitian input. - Continue night tube feeding and encourage p.o. intake - Zofran as needed for nausea   Chronic hypotension Holding midodrine because blood pressure is normal.   Transitions of care Awaiting apartment inspection.   OUD and chronic methadone use - Continue daily  methadone  Anxiety Insomnia Chronic and stable - Continue duloxetine, trazodone, and Atarax.   Diet: Regular with thin liquids and night time tube feeds IVF: None,None VTE: Rivaroxaban TOC: apartment awaiting county inspection Code: Full   Dispo: Anticipated discharge to Home in 7 days pending treatment of candidemia.  [name], MD 08/14/2021, 6:00 AM  Pager: [phone] After 5pm on weekdays and 1pm on weekends: On Call pager: [phone]

## 2021-08-15 DIAGNOSIS — B376 Candidal endocarditis: Secondary | ICD-10-CM | POA: Diagnosis not present

## 2021-08-15 MED ORDER — OXYCODONE HCL 5 MG PO TABS
5.0000 mg | ORAL_TABLET | Freq: Once | ORAL | Status: AC
  Administered 2021-08-15: 5 mg via ORAL
  Filled 2021-08-15: qty 1

## 2021-08-15 NOTE — TOC Progression Note (Signed)
Transition of Care [facility]) - Progression Note    Patient Details  Name: David [name] MRN: [id number] Date of Birth: [birth year]  Transition of Care [facility]) CM/SW [place], RN Phone Number: 08/15/2021, 10:41 AM  Clinical Narrative:    [place] is continuing to assist patient with pending apartment availability - apartment needs to be inspected by [facility] prior to his discharge.  CM noted that patient is currently receiving IV antifungal medications but per ID MD note - once the patient's apartment is available at a later date - patient can be transitioned to PO medications since the patient is not a safe candidate for IV medications/PICC line in the home.  CM and MSW with DTP Team will continue to follow the patient for discharge planning needs - no discharge date has been determined at this time.   Expected Discharge Plan: [place] Barriers to Discharge: Homeless with medical needs  Expected Discharge Plan and Services Expected Discharge Plan: [place] In-house Referral: Clinical Social Work Discharge Planning Services: CM Consult Post Acute Care Choice: [place] arrangements for the past 2 months: Single Family Home                                       Social Determinants of Health (SDOH) Interventions    Readmission Risk Interventions     No data to display

## 2021-08-15 NOTE — Progress Notes (Signed)
Subjective:   Hospital day 70.  Interval events: No interval events.  Patient evaluated at the bedside laying comfortably in bed. He is a little frustrated that his trach is still in. He states it is uncomfortable and would like ENT to come evaluate for possible removal. No other concerns. Discussed plan for possible PICC line placement.   Objective:  Vital signs: Blood pressure 137/88, pulse 85, temperature 98.6 F (37 C), temperature source Oral, resp. rate 18, height '5\' 11"'$  (1.803 m), weight 41.9 kg, SpO2 98 %.  Physical exam: Constitutional: Frail-appearing male lying in bed. No acute distress HEENT: Trach stable in place w/ mild erythema around site.  Pulmonary: On room air. Normal effort Abdomen: PEG tube left lower quadrant. Skin: No rashes or lesions on visible skin. Neuro: Alert and oriented. Moves all extremities.  Psych: Mild frustration but appropriate affect   Intake/Output Summary (Last 24 hours) at 08/15/2021 1031 Last data filed at 08/15/2021 6440 Gross per 24 hour  Intake 688 ml  Output 400 ml  Net 288 ml    Pertinent Labs:    Latest Ref Rng & Units 08/13/2021    3:01 AM 08/10/2021   10:25 AM 07/26/2021    7:50 AM  CBC  WBC 4.0 - 10.5 K/uL 7.1  5.9  8.6   Hemoglobin 13.0 - 17.0 g/dL 9.9  10.4  11.9   Hematocrit 39.0 - 52.0 % 30.3  32.2  36.3   Platelets 150 - 400 K/uL 136  104  273        Latest Ref Rng & Units 08/13/2021    3:01 AM 08/10/2021   10:25 AM 07/24/2021   12:08 PM  CMP  Glucose 70 - 99 mg/dL 107  109  117   BUN 6 - 20 mg/dL '29  31  21   '$ Creatinine 0.61 - 1.24 mg/dL 0.58  0.63  0.57   Sodium 135 - 145 mmol/L 132  134  134   Potassium 3.5 - 5.1 mmol/L 4.8  4.9  4.4   Chloride 98 - 111 mmol/L 95  100  96   CO2 22 - 32 mmol/L '25  26  29   '$ Calcium 8.9 - 10.3 mg/dL 9.1  9.3  9.4   Total Protein 6.5 - 8.1 g/dL 6.8  7.5    Total Bilirubin 0.3 - 1.2 mg/dL 0.5  0.6    Alkaline Phos 38 - 126 U/L 93  146    AST 15 - 41 U/L 48   88    ALT 0 - 44 U/L 49  68     Blood cultures from 08/12/2021 still no growth after 3 days  Assessment/Plan:   Principal Problem:   Candidal endocarditis Active Problems:   Laryngeal cancer (HCC)   Goals of care, counseling/discussion   Protein-calorie malnutrition, severe   Tracheostomy status (HCC)   Pressure injury of skin   S/P percutaneous endoscopic gastrostomy (PEG) tube placement [facility])   Patient Summary: David [name] is a 46 y.o. 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 Hospital course complicated by endocarditis and candidemia.  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 Likely 2/2 to candida infection. Blood cultures still with no growth after 3  days. He remains afebrile. Currently on IV micafungin. Final recommendations provided by ID below.   --ID signed off today, recs below -Continue IV micafungin until 8/27, if discharging, can transition to fluconazole  800 mg daily. -After 8/27, transition to fluconazole 400 mg daily for 3-6 months, then eventually on 200 mg daily -If Bcx negative on 7/22, can place PICC for long-term antibiotics if still here -ID clinic f/u at 8/30 '@11'$ :15 am with Dr. Gale Journey --Continue to follow cultures --Outpatient ophthalmology appt before discharge to rule out intraocular  History of stage IVB SCC of supraglottic region, postradiation Tracheostomy dependent Trach remain stable. Patient frustrated that ENT has not come by the bedside to see him. Has follow-up with ENT 08/30/2021 for repeat laryngoscopy and evaluation for decannulation. Will reach out to ENT today.  --Continue routine trach care --Pain control with PRN oxycodone 5 mg q6h, IV Toradol 30 mg q6h and PRN tylenol 650 mg po TID  --Continue  PT/OT --Continue PRN hypertonic nebs q8h and Robitussin q6h for secretions - Continue prn Chloraseptic spray and lidocaine  Severe protein calorie malnutrition PEG tube dependent Stable. Last weight 7/10 41.9 kg (92.3 lbs). Albumin still low at 2.6. Has been tolerating p.o. intake for the last few weeks. --RD following, appreciate recs --Continue to encourage p.o. intake --Continue night feeding --PRN zofran 4 mg   Chronic hypotension Blood pressure has been stable off midodrine. SBP in the 120-130 -CTM   Transitions of care Pending apartment inspection by the county.  -CSW following, appreciate assistance   OUD and chronic methadone use -Continue methadone 50 mg daily  Anxiety Insomnia Chronic and stable - Continue duloxetine, trazodone, and Atarax.   Diet: Regular with thin liquids and night time tube feeds IVF: None,None VTE: Rivaroxaban TOC: apartment awaiting county inspection Code: Full   Dispo: Anticipated discharge to Home in 7 days pending treatment of candidemia and completion of apartment inspection.  Lacinda Axon, MD 08/15/2021, 10:31 AM  Pager: [phone] After 5pm on weekdays and 1pm on weekends: On Call pager: [phone]

## 2021-08-15 NOTE — Progress Notes (Addendum)
Assessment/Plan: Tv endocarditis Candida albican fungemia Hx strep pna mv endocarditis 06/06/21, s/p treatment  Hx IV drug use Stage IV head/neck scc s/p adjuvant chemo and xrt 12/2020; trache dependent; peg dependent due to swallow interference from trach. No evidence of active disease at this time per patient   7/14 bcx candida albican 7/16 bcx ngtd  On 7/22 if blood cx remains negative by then can place picc for long-term micafungin iv antibiotics   No great evidence one needs IV echinocandin for candida endocarditis over fluconazole... so if he remains in house can keep on echinocandin, BUT I wouldn't let that be the sole reason to keep him in house  For the initial 6 weeks from 7/16 until 8/27 Again inhouse can keep on micafungin, and If he is ready to go, could discharge on fluconazole high dose 800 mg PO daily  After 8/27 Keep on fluconazole 400 mg PO daily for 3-6 months and then eventually would maintain on 200 mg daily   Again please arrange outpatient ophthalmology visit to r/o intraocular involvement with candida   Discussed with primary team   Clinic Follow Up Appt: 08/30 @ 1115 with dr Gale Journey  @  [facility] [place], [place], [place] [address] Phone: [phone]

## 2021-08-15 NOTE — Procedures (Signed)
Bedside Tracheostomy Insertion Procedure Note   Patient Details:   Name: David [name] DOB: 1975/08/21 MRN: [id number]  Procedure: Tracheostomy  Pre Procedure Assessment: ET Tube Size: ET Tube secured at lip (cm): Bite block in place: No Breath Sounds: Diminished  Post Procedure Assessment: BP 137/88 (BP Location: Right Leg)   Pulse 84   Temp 98.6 F (37 C) (Oral)   Resp 16   Ht '5\' 11"'$  (1.803 m) Comment: from previous admission  Wt 41.9 kg   SpO2 97%   BMI 12.87 kg/m  O2 sats: stable throughout Complications: No apparent complications Patient did tolerate procedure well Tracheostomy Brand:Shiley Tracheostomy Style:Uncuffed Tracheostomy Size:  4 Tracheostomy Secured AOZ:[id number] Tracheostomy Placement Confirmation:Trach cuff visualized and in place Pt had good CO2 exchange and Bilateral breath sounds were heard. Pt is currently stable.   Felecia Jan 08/15/2021, 3:49 PM

## 2021-08-15 NOTE — Progress Notes (Signed)
IVT consult placed for PIV access for medications. IV RN attempted x2; both times patient requested I stop as soon as I initiated the stick. Requested another team member make an attempt. Patient has had numerous PIV's with a history of IV drug use. Patient's vasculature is extremely limited making PIV access difficult. Requesting second assessment.   Nagi Furio Lorita Officer, RN

## 2021-08-15 NOTE — Plan of Care (Signed)
°  Problem: Respiratory: °Goal: Ability to maintain adequate ventilation will improve °Outcome: Progressing °  °

## 2021-08-16 DIAGNOSIS — M549 Dorsalgia, unspecified: Secondary | ICD-10-CM

## 2021-08-16 DIAGNOSIS — B376 Candidal endocarditis: Secondary | ICD-10-CM | POA: Diagnosis not present

## 2021-08-16 LAB — CBC
HCT: 33 % — ABNORMAL LOW (ref 39.0–52.0)
Hemoglobin: 11 g/dL — ABNORMAL LOW (ref 13.0–17.0)
MCH: 29.3 pg (ref 26.0–34.0)
MCHC: 33.3 g/dL (ref 30.0–36.0)
MCV: 88 fL (ref 80.0–100.0)
Platelets: 186 10*3/uL (ref 150–400)
RBC: 3.75 MIL/uL — ABNORMAL LOW (ref 4.22–5.81)
RDW: 14.1 % (ref 11.5–15.5)
WBC: 7.9 10*3/uL (ref 4.0–10.5)
nRBC: 0 % (ref 0.0–0.2)

## 2021-08-16 LAB — BASIC METABOLIC PANEL
Anion gap: 9 (ref 5–15)
BUN: 19 mg/dL (ref 6–20)
CO2: 24 mmol/L (ref 22–32)
Calcium: 9.3 mg/dL (ref 8.9–10.3)
Chloride: 100 mmol/L (ref 98–111)
Creatinine, Ser: 0.44 mg/dL — ABNORMAL LOW (ref 0.61–1.24)
GFR, Estimated: >60 mL/min (ref >60)
Glucose, Bld: 125 mg/dL — ABNORMAL HIGH (ref 70–99)
Potassium: 4.4 mmol/L (ref 3.5–5.1)
Sodium: 133 mmol/L — ABNORMAL LOW (ref 135–145)

## 2021-08-16 MED ORDER — METHADONE HCL 10 MG PO TABS
20.0000 mg | ORAL_TABLET | Freq: Three times a day (TID) | ORAL | Status: AC
  Administered 2021-08-16 – 2021-08-21 (×16): 20 mg
  Filled 2021-08-16 (×19): qty 2

## 2021-08-16 MED ORDER — ACETAMINOPHEN 500 MG PO TABS
1000.0000 mg | ORAL_TABLET | Freq: Once | ORAL | Status: AC
  Administered 2021-08-16: 1000 mg via ORAL
  Filled 2021-08-16: qty 2

## 2021-08-16 MED ORDER — OXYCODONE HCL 5 MG PO TABS
5.0000 mg | ORAL_TABLET | Freq: Once | ORAL | Status: AC
  Administered 2021-08-16: 5 mg via ORAL
  Filled 2021-08-16: qty 1

## 2021-08-16 NOTE — Progress Notes (Signed)
Unable to administer IV antibiotics. IV team had 2 Attempts for IV access by two different IV team nurses but unsuccesful. Pt not following instructions. Delene Ruffini, MD was notified and he discussed the plan with pt and RN. IV day team will try again in the morning. Pharmacy was notified to reschedule medication times

## 2021-08-16 NOTE — TOC Progression Note (Addendum)
Transition of Care [facility]) - Progression Note    Patient Details  Name: David [name] MRN: [id number] Date of Birth: [birth year]  Transition of Care [facility]) CM/SW [place], RN Phone Number: 08/16/2021, 2:10 PM  Clinical Narrative:    CM called and spoke with David [name], CM at [facility] and the patient's apartment is being inspected by David [name] on 08/20/2021 and will be available to have [name] delivered to the apartment on 08/21/21.  David [name], CM at [facility] is planning to coordinate with David [name] company to meet them at the apartment to have home equipment delivered as ordered.  The patient will be able to safely move in to the apartment on 08/22/2021.  The patient's attending physicians were notified and that the patient has available apartment to discharge to but will need to have IV medications requirements transitioned to PO before he can discharge to the apartment since the patient will not be safe to discharge to home with an IV due to patient's history of IV drug use.  08/16/2021 1456 - CM spoke with the patient at the bedside and he was updated that his apartment would be inspected on 08/20/2021 and that David [name] will coordinate with David [name] to have [name] delivered to the apartment on 08/21/21.  I explained to the patient that his [name] would need to be available and delivered before he could safely discharge to the apartment and he is aware.  I encouraged the patient to reach out to his mother to update her on the available date for discharge from the hospital and encouraged him to have her transport him to the apartment on the day of discharge so that she may assist him.  The patient's mother was planning to help the patient with transportation and groceries from a previous conversation with the patient.  CM and MSW with David [name] will continue to follow the patient for discharge planning needs.   Expected Discharge Plan: David [name] Barriers to Discharge: Homeless with medical needs  Expected Discharge Plan and Services Expected Discharge Plan: David [name] Agency In-house Referral: Clinical Social Work Discharge Planning Services: CM Consult Post Acute Care Choice: [place] arrangements for the past 2 months: Single Family Home                                       Social Determinants of Health (SDOH) Interventions    Readmission Risk Interventions     No data to display

## 2021-08-16 NOTE — Progress Notes (Signed)
Speech Language Pathology Treatment: Dysphagia  Patient Details Name: Mat Stuard MRN: [id number] DOB: 06/09/1975 Today's Date: 08/16/2021 Time: 1430-1440 SLP Time Calculation (min) (ACUTE ONLY): 10 min  Assessment / Plan / Recommendation Clinical Impression  Patient seen by SLP for skilled treatment session focused on dysphagia goals. No PO's given as focus was on patient education regarding swallow safety. He reported to SLP that he has not had to suction self much lately and has had decreased pharyngeal secretions. SLP noted that his voice was hoarse as it has been but no audible secretions heard. Patient is in good spirits, telling SLP that his apartment will be inspected on Monday and he can move in Wednesday and he has been changed to oral meds for the IV meds he has been on. Patient aware of swallow precautions and is capable of performing without supervision. At this time, SLP to s/o as patient is tolerating regular solids, thin liquids with learned swallow precautions.     HPI HPI: 47yo male with past medical history of laryngeal cancer s/p chemo and radiation and heroin abuse presented to ED on 06/06/21 from home with complaints of shortness of breath and not being able to eat for several days. Patient was admitted on 06/06/2021 with acute hypoxic respiratory failure with sepsis secondary to aspiration pneumonia, elevated D-dimer, AKI. Developed worsening respiratory distress requiring emergent tracheostomy on 5/13. Patient got PEG on 06/20/21. Currently tolerating 4 cuffless trach.      SLP Plan  Discharge SLP treatment due to (comment);All goals met      Recommendations for follow up therapy are one component of a multi-disciplinary discharge planning process, led by the attending physician.  Recommendations may be updated based on patient status, additional functional criteria and insurance authorization.    Recommendations  Diet recommendations: Regular;Thin liquid Liquids  provided via: Cup Medication Administration: Whole meds with puree Supervision: Patient able to self feed Compensations: Slow rate;Small sips/bites;Other (Comment);Clear throat after each swallow Postural Changes and/or Swallow Maneuvers: Seated upright 90 degrees                Oral Care Recommendations: Oral care BID;Patient independent with oral care Follow Up Recommendations: Follow physician's recommendations for discharge plan and follow up therapies Assistance recommended at discharge: Intermittent Supervision/Assistance SLP Visit Diagnosis: Dysphagia, pharyngoesophageal phase (R13.14) Plan: Discharge SLP treatment due to (comment);All goals met          Sonia Baller, MA, CCC-SLP Speech Therapy

## 2021-08-16 NOTE — Plan of Care (Signed)
  Problem: Activity: Goal: Ability to tolerate increased activity will improve 08/16/2021 0645 by Delton See, RN Outcome: Progressing 08/16/2021 0645 by Delton See, RN Outcome: Progressing   Problem: Clinical Measurements: Goal: Ability to maintain a body temperature in the normal range will improve 08/16/2021 0645 by Delton See, RN Outcome: Progressing 08/16/2021 0645 by Delton See, RN Outcome: Progressing   Problem: Respiratory: Goal: Ability to maintain adequate ventilation will improve 08/16/2021 0645 by Delton See, RN Outcome: Progressing 08/16/2021 0645 by Delton See, RN Outcome: Progressing Goal: Ability to maintain a clear airway will improve 08/16/2021 0645 by Delton See, RN Outcome: Progressing 08/16/2021 0645 by Delton See, RN Outcome: Progressing   Problem: Education: Goal: Knowledge of General Education information will improve Description: Including pain rating scale, medication(s)/side effects and non-pharmacologic comfort measures 08/16/2021 0645 by Delton See, RN Outcome: Progressing 08/16/2021 0645 by Delton See, RN Outcome: Progressing   Problem: Nutrition: Goal: Adequate nutrition will be maintained 08/16/2021 0645 by Delton See, RN Outcome: Progressing 08/16/2021 0645 by Delton See, RN Outcome: Progressing   Problem: Coping: Goal: Level of anxiety will decrease 08/16/2021 0645 by Delton See, RN Outcome: Progressing 08/16/2021 0645 by Delton See, RN Outcome: Progressing   Problem: Pain Managment: Goal: General experience of comfort will improve 08/16/2021 0645 by Delton See, RN Outcome: Progressing 08/16/2021 0645 by Delton See, RN Outcome: Progressing   Problem: Safety: Goal: Ability to remain free from injury will improve 08/16/2021 0645 by Delton See, RN Outcome: Progressing 08/16/2021 0645 by Delton See, RN Outcome: Progressing   Problem: Skin Integrity: Goal: Risk for impaired skin integrity will decrease 08/16/2021 0645 by Delton See, RN Outcome: Progressing 08/16/2021 0645 by Delton See, RN Outcome: Progressing

## 2021-08-16 NOTE — Progress Notes (Signed)
IVT consult note: 2 Attempts for IV access unsuccesful. Pt not following instructions. Upon initial placement of needle in patients arm, Pt stating to pull it out. Bilateral extremities w/ multiple previous IV access.Unit RN notified.

## 2021-08-16 NOTE — Progress Notes (Addendum)
Paged regarding lost IV access.   Per nursing, two separate IV nurses attempted to replace IV but were unsuccessful as patient was not following instructions. Discussed with patient at bedside the importance of IV medication. He voiced that he would be compliant with IV team. Unfortunately, since patient had not been following directions, previous two IV nurses were unable to safely place IV with one near needle stick. 3rd IV nurse not willing to attempt IV placement.  Additionally, patient reported severe pain at ihs tracheostomy site rating it 10/10.   On exam: Patient did not appear to be in acute distress.  Neck was supple, trach appeared intact without surrounding edema or erythema. No blood noted either. Heart was RRR.  A/P: Unable to place new IV tonight. Consult for new IV placement to be made by RN. If day team is not able to successfully place IV, may need to consider alternatives. Additional dose PO oxycodone '5mg'$  ordered for pain.

## 2021-08-16 NOTE — Progress Notes (Signed)
Subjective:   Hospital day 59.  Interval events: PIV was pulled out.  Replaced this morning.  David [name] is in low spirits today.  He complains of low back pain.  It is intermittent and severe.  He has had this pain before.  His current pain regimen does not alleviate the pain.  Objective:  Vital signs: Blood pressure (!) 137/126, pulse 78, temperature 98.4 F (36.9 C), temperature source Oral, resp. rate 18, height '5\' 11"'$  (1.803 m), weight 41.9 kg, SpO2 100 %.  Physical exam: Constitutional: Frail-appearing male. HEENT: Tracheostomy. Pulmonary: Normal work of breathing noted. Abdominal: G-tube. Skin: Warm and dry. Extremities: Left radial pulse 2+. Neuro: Alert and oriented. Psych: Upset mood and affect.   Intake/Output Summary (Last 24 hours) at 08/16/2021 1435 Last data filed at 08/16/2021 1300 Gross per 24 hour  Intake 760 ml  Output 2000 ml  Net -1240 ml    Pertinent Labs:    Latest Ref Rng & Units 08/16/2021    5:40 AM 08/13/2021    3:01 AM 08/10/2021   10:25 AM  CBC  WBC 4.0 - 10.5 K/uL 7.9  7.1  5.9   Hemoglobin 13.0 - 17.0 g/dL 11.0  9.9  10.4   Hematocrit 39.0 - 52.0 % 33.0  30.3  32.2   Platelets 150 - 400 K/uL 186  136  104        Latest Ref Rng & Units 08/16/2021    5:40 AM 08/13/2021    3:01 AM 08/10/2021   10:25 AM  CMP  Glucose 70 - 99 mg/dL 125  107  109   BUN 6 - 20 mg/dL '19  29  31   '$ Creatinine 0.61 - 1.24 mg/dL 0.44  0.58  0.63   Sodium 135 - 145 mmol/L 133  132  134   Potassium 3.5 - 5.1 mmol/L 4.4  4.8  4.9   Chloride 98 - 111 mmol/L 100  95  100   CO2 22 - 32 mmol/L '24  25  26   '$ Calcium 8.9 - 10.3 mg/dL 9.3  9.1  9.3   Total Protein 6.5 - 8.1 g/dL  6.8  7.5   Total Bilirubin 0.3 - 1.2 mg/dL  0.5  0.6   Alkaline Phos 38 - 126 U/L  93  146   AST 15 - 41 U/L  48  88   ALT 0 - 44 U/L  49  68    Blood cultures NGTD x4 days  Assessment/Plan:   Principal Problem:   Candidal endocarditis Active Problems:   Laryngeal  cancer (HCC)   Goals of care, counseling/discussion   Protein-calorie malnutrition, severe   Tracheostomy status (HCC)   Pressure injury of skin   S/P percutaneous endoscopic gastrostomy (PEG) tube placement (HCC)   Back pain   Patient Summary: David [name] is a 46 y.o. 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  Hospital course complicated by endocarditis and candidemia.  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  Cultures no growth at 4 days.  IV micafungin through discharge at which point patient can transition to oral therapeutic fluconazole 6 months then prophylactic fluconazole indefinitely. - Continue micafungin 150 mg IV daily while hospitalized. - Follow-up blood cultures.  Back pain New complaint  of back pain today.  Patient has had this pain before.  Suspect it could be benign musculoskeletal pain secondary to prolonged time in bed and profound deconditioning.  However, not sure of the etiology, and in the setting of endocarditis and cancer need to maintain a high index of suspicion for more malignant cause like osteomyelitis or metastatic disease.  We will reassess need for imaging if pain continues and does not respond to an increased dose of methadone in divided doses. - Tomorrow, increase methadone to 20 mg p.o. 3 times daily  OUD and chronic methadone use Makes pain management in the acute setting challenging.  We will attempt an increase of methadone in divided doses tomorrow for back pain as noted above. - Continue methadone 50 mg p.o. today - Increase methadone to 20 mg p.o. 3 times daily  History of stage IVb SCC of supraglottic region, postradiation and cisplatin Tracheostomy dependent Stable.  Follow-up with ENT 08/30/2021 for repeat laryngoscopy and evaluation for decannulation. -  Continue routine trach care - Tylenol 650 mg p.o. 3 times daily - Oxycodone 5 mg p.o. every 6 hours as needed - Toradol 30 mg IV every 6 hours as needed - Hypertonic nebulizers every 8 hours as needed for secretions - Robitussin every 6 hours as needed for secretions - Chloraseptic Spray and lidocaine solution as needed for throat and mouth pain  Severe protein calorie malnutrition G-tube dependent Stable.  Last weight 41.9 kg 08/06/2021.  Hypoalbuminemia.  Tolerating some p.o. intake. - RD following, appreciate their input - Encourage p.o. intake - Continue nighttime feeding - Zofran 4 mg IV as needed for nausea and vomiting  Chronic hypotension Patient has been normotensive or somewhat hypertensive for some time now. - Continue holding midodrine  Transitions of care Apartment will be ready for movement on 08/22/2021.  At that point we will transition to oral fluconazole for discharge. - CSW following, appreciate their assistance  Anxiety Insomnia Chronic and stable - Continue duloxetine, trazodone, Atarax  Diet: Normal with supplemental nighttime G-tube feeding IVF: None,None VTE: Rivaroxaban Code: Full PT/OT recs: None, none. TOC recs: pending apartment inspection  Dispo: Anticipated discharge to Home in 6 days pending IV micafungin and safe discharge plan.   [name] MD 08/16/2021, 2:35 PM  Pager: [phone] After 5pm on weekdays and 1pm on weekends: On Call pager: [phone]

## 2021-08-17 ENCOUNTER — Ambulatory Visit: Payer: Medicaid Other | Admitting: Hematology and Oncology

## 2021-08-17 DIAGNOSIS — B376 Candidal endocarditis: Secondary | ICD-10-CM | POA: Diagnosis not present

## 2021-08-17 LAB — ANTIFUNGAL AST 9 DRUG PANEL
Amphotericin B MIC: 0.5
Fluconazole Islt MIC: 1
Flucytosine MIC: 0.12
Itraconazole MIC: 0.25
Posaconazole MIC: 0.12
Source: 183119

## 2021-08-17 LAB — CULTURE, BLOOD (ROUTINE X 2)
Culture: NO GROWTH
Culture: NO GROWTH
Special Requests: ADEQUATE
Special Requests: ADEQUATE

## 2021-08-17 NOTE — Progress Notes (Signed)
Subjective:   Hospital day 72.  Interval events: None.  Feels well overnight.  Spirits are up given the news of impending apartment inspection.  Back pain has improved somewhat.  Objective:  Vital signs: Blood pressure 107/69, pulse 93, temperature 98.5 F (36.9 C), temperature source Oral, resp. rate 18, height '5\' 11"'$  (1.803 m), weight 41.9 kg, SpO2 99 %.  Physical exam: Constitutional: Frail appearing male laying in bed. Pulmonary: Normal work of breathing noted. MSK: No spinal pain or tenderness. Skin: Warm and dry. Neuro: Alert and oriented. Psych: Pleasant.  Appropriate mood and affect.   Intake/Output Summary (Last 24 hours) at 08/17/2021 1543 Last data filed at 08/17/2021 1200 Gross per 24 hour  Intake 168.84 ml  Output 300 ml  Net -131.16 ml    Pertinent Labs:    Latest Ref Rng & Units 08/16/2021    5:40 AM 08/13/2021    3:01 AM 08/10/2021   10:25 AM  CBC  WBC 4.0 - 10.5 K/uL 7.9  7.1  5.9   Hemoglobin 13.0 - 17.0 g/dL 11.0  9.9  10.4   Hematocrit 39.0 - 52.0 % 33.0  30.3  32.2   Platelets 150 - 400 K/uL 186  136  104        Latest Ref Rng & Units 08/16/2021    5:40 AM 08/13/2021    3:01 AM 08/10/2021   10:25 AM  CMP  Glucose 70 - 99 mg/dL 125  107  109   BUN 6 - 20 mg/dL '19  29  31   '$ Creatinine 0.61 - 1.24 mg/dL 0.44  0.58  0.63   Sodium 135 - 145 mmol/L 133  132  134   Potassium 3.5 - 5.1 mmol/L 4.4  4.8  4.9   Chloride 98 - 111 mmol/L 100  95  100   CO2 22 - 32 mmol/L '24  25  26   '$ Calcium 8.9 - 10.3 mg/dL 9.3  9.1  9.3   Total Protein 6.5 - 8.1 g/dL  6.8  7.5   Total Bilirubin 0.3 - 1.2 mg/dL  0.5  0.6   Alkaline Phos 38 - 126 U/L  93  146   AST 15 - 41 U/L  48  88   ALT 0 - 44 U/L  49  68    Assessment/Plan:   Principal Problem:   Candidal endocarditis Active Problems:   Laryngeal cancer (HCC)   Goals of care, counseling/discussion   Protein-calorie malnutrition, severe   Tracheostomy status ([place])   Pressure injury of  skin   S/P percutaneous endoscopic gastrostomy (PEG) tube placement (HCC)   Back pain   Patient Summary: David [name] is a 46 y.o. 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  Hospital course complicated by endocarditis and candidemia.  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  Cultures no growth at 5 days.  Susceptible to micafungin and fluconazole.  IV micafungin through discharge at which point patient can transition to oral therapeutic fluconazole 6 months then prophylactic fluconazole indefinitely. - Continue micafungin 150 mg IV daily while hospitalized. - Follow-up blood cultures.  History of stage IVb SCC of supraglottic region, postradiation and cisplatin Tracheostomy dependent Stable.  Follow-up with ENT 08/30/2021 for repeat laryngoscopy and  evaluation for decannulation.  Based on recent CT neck findings, ENT and rad onc recommend CT chest and US guided biopsy of enlarged cervical lymph node. - Continue routine trach care - Tylenol 650 mg p.o. 3 times daily - Oxycodone 5 mg p.o. every 6 hours as needed - Toradol 30 mg IV every 6 hours as needed - Hypertonic nebulizers every 8 hours as needed for secretions - Robitussin every 6 hours as needed for secretions - Chloraseptic Spray and lidocaine solution as needed for throat and mouth pain  OUD and chronic methadone use Makes pain management in the acute setting challenging.  We will attempt an increase of methadone in divided doses tomorrow for back pain as noted above. - Continue methadone to 20 mg p.o. 3 times daily  Back pain Improved today.  Index of suspicion for septic emboli is lower.  If returns may follow-up with MRI at that time.  Severe protein calorie malnutrition G-tube dependent Stable.  Last weight 41.9  kg 08/06/2021.  Hypoalbuminemia.  Tolerating some p.o. intake. - RD following, appreciate their input - Encourage p.o. intake - Continue nighttime feeding - Zofran 4 mg IV as needed for nausea and vomiting  Chronic hypotension Patient has been normotensive or somewhat hypertensive for some time now. - Continue holding midodrine  Transitions of care Apartment will be ready for movement on 08/22/2021.  At that point we will transition to oral fluconazole for discharge. - CSW following, appreciate their assistance  Anxiety Insomnia Chronic and stable - Continue duloxetine, trazodone, Atarax   Diet: Normal with supplemental nighttime G-tube feeding IVF: None,None VTE: Rivaroxaban Code: Full PT/OT recs: None, none. TOC recs: pending apartment inspection   Dispo: Anticipated discharge to Home in 5 days pending IV micafungin and safe discharge plan.   [name] MD 08/17/2021, 3:43 PM  Pager: [phone] After 5pm on weekdays and 1pm on weekends: On Call pager: [phone]

## 2021-08-17 NOTE — Progress Notes (Addendum)
ENT PROGRESS NOTE   Subjective: Patient seen and examined at bedside. Patient resting in bed. Not engaging when asked questions, nods his head yes/no. Indicates pain around trach site has improved, no questions. No family at bedside.  Objective: Vital signs in last 24 hours: Temp:  [98.5 F (36.9 C)-100.1 F (37.8 C)] 98.5 F (36.9 C) (07/21 0938) Pulse Rate:  [79-109] 81 (07/21 1131) Resp:  [18-19] 18 (07/21 1131) BP: (107-197)/(58-80) 107/69 (07/21 0938) SpO2:  [91 %-100 %] 91 % (07/21 1131) FiO2 (%):  [21 %] 21 % (07/21 0340)  CONSTITUTIONAL: Chronically ill-appearing, cachetic, no acute distress. PULMONARY: on trach collar HENT: Head : normocephalic and atraumatic NECK: Shiley size 4 trach in place, patent. Secured with trach ties. Skin under trach appears less irritated.  Recent Labs    08/16/21 0540  NA 133*  K 4.4  CL 100  CO2 24  GLUCOSE 125*  BUN 19  CREATININE 0.44*  CALCIUM 9.3    Medications: I have reviewed the patient's current medications.  New Imaging: CLINICAL DATA:  New onset fever. Pain and soreness surrounding the tracheostomy.   EXAM: CT NECK WITH CONTRAST   TECHNIQUE: Multidetector CT imaging of the neck was performed using the standard protocol following the bolus administration of intravenous contrast.   RADIATION DOSE REDUCTION: This exam was performed according to the departmental dose-optimization program which includes automated exposure control, adjustment of the mA and/or kV according to patient size and/or use of iterative reconstruction technique.   CONTRAST:  52m OMNIPAQUE IOHEXOL 300 MG/ML  SOLN   COMPARISON:  CT neck with contrast 07/24/2021   FINDINGS: Pharynx and larynx: Extensive radiation changes are noted. Tracheostomy tube is in place. Linear enhancement is noted along the right side of the residual supraglottic tissue measuring 17 x 5 mm on the coronal images. Irregular bilateral enhancement is present at the  level of the false cords with nodular density on the right. Diffuse edema again noted.   Overall edema about the tracheostomy tube is decreased. Edema in the more superior supraglottic tissues and oral cavity has also decreased.   Salivary glands: Submandibular and parotid glands are hyperdense, related to radiation therapy. No focal lesions are present.   Thyroid: Unremarkable.   Lymph nodes: Peripherally enhancing consolidated nodes the right level 2 station stable to slightly increased in size, now measuring 3.4 x 2.5 x 1.8 cm. A peripherally enhancing right lateral pharyngeal lymph node has decreased in size, now measuring 11 x 9 x 17 mm.   An 8 mm right level 4 lymph node is stable.   No new nodal disease is present.   Vascular: Atherosclerotic changes are present at the bifurcations bilaterally without significant stenosis.   Limited intracranial: Within normal limits.   Visualized orbits: Bilateral lens replacements are noted. The globes and orbits are within normal limits.   Mastoids and visualized paranasal sinuses: Bilateral lens replacements are noted. Globes and orbits are otherwise unremarkable.   Skeleton: No focal osseous lesions are present.   Upper chest: Airspace consolidation in the posterior right upper lobe is improved since the prior study. Post radiation changes are noted at the apex.   IMPRESSION: 1. Decreased overall edema within the supraglottic neck and surrounding the tracheostomy tube. 2. No focal fluid collection to suggest abscess or infection. 3. Linear enhancement along the right side of the residual supraglottic and marked irregular enhancement at the level of the false cords tissue is concerning for residual or recurrent tumor. 4. Right level  2 and level 4 lymph nodes are stable to slightly increased in size. 5. No new nodal disease. 6. Improved airspace disease in the posterior right upper lobe   Assessment/Plan: David [name] is a 46 y/o M with PMH of supraglottic SCC, s/p completion of RT, s/p emergent trach 5/13 -Patient had new CT neck performed 7/15 due to fevers, which noted enhancement along the right supragottis and enlarging cervical lymph node concerning for residual cancer -Scheduled to FU with me 08/03 for laryngoscopy -After discussion of new imaging findings with rad onc and concern for recurrent disease, recommend proceeding with CT Chest with contrast as well as US guided biopsy of enlarged cervical lymph node during this admission. Further recommendations pending results of imaging and biopsy. -Continue routine trach care. Medical management per primary    LOS: 72 days    Jason Coop, [facility] 08/17/2021, 12:33 PM

## 2021-08-17 NOTE — Plan of Care (Signed)
  Problem: Respiratory: Goal: Ability to maintain adequate ventilation will improve Outcome: Progressing Goal: Ability to maintain a clear airway will improve Outcome: Progressing   Problem: Clinical Measurements: Goal: Respiratory complications will improve Outcome: Progressing Goal: Cardiovascular complication will be avoided Outcome: Progressing   Problem: Coping: Goal: Level of anxiety will decrease Outcome: Progressing   Problem: Elimination: Goal: Will not experience complications related to urinary retention Outcome: Progressing   Problem: Pain Managment: Goal: General experience of comfort will improve Outcome: Progressing

## 2021-08-17 NOTE — Plan of Care (Signed)
  Problem: Activity: Goal: Ability to tolerate increased activity will improve Outcome: Progressing   Problem: Clinical Measurements: Goal: Ability to maintain a body temperature in the normal range will improve Outcome: Progressing   Problem: Respiratory: Goal: Ability to maintain adequate ventilation will improve Outcome: Progressing Goal: Ability to maintain a clear airway will improve Outcome: Progressing   Problem: Education: Goal: Knowledge of General Education information will improve Description: Including pain rating scale, medication(s)/side effects and non-pharmacologic comfort measures Outcome: Progressing   Problem: Health Behavior/Discharge Planning: Goal: Ability to manage health-related needs will improve Outcome: Progressing   Problem: Clinical Measurements: Goal: Ability to maintain clinical measurements within normal limits will improve Outcome: Progressing Goal: Will remain free from infection Outcome: Progressing Goal: Diagnostic test results will improve Outcome: Progressing Goal: Respiratory complications will improve Outcome: Progressing Goal: Cardiovascular complication will be avoided Outcome: Progressing   Problem: Activity: Goal: Risk for activity intolerance will decrease Outcome: Progressing   Problem: Nutrition: Goal: Adequate nutrition will be maintained Outcome: Progressing   Problem: Coping: Goal: Level of anxiety will decrease Outcome: Progressing   Problem: Pain Managment: Goal: General experience of comfort will improve Outcome: Progressing   Problem: Safety: Goal: Ability to remain free from injury will improve Outcome: Progressing   Problem: Skin Integrity: Goal: Risk for impaired skin integrity will decrease Outcome: Progressing

## 2021-08-17 NOTE — Progress Notes (Signed)
Interventional Radiology Brief Note:  David [name] is a 46 year old with H/o supraglottic SCC,completed treatment but admitted in May with failure to thrive, respiratory compromise.  Underwent emergent trach 5/13.  Missed his outpatient post-treatment PET scan due to  prolonged hospitalization, now has fevers and trach pain.  CT shows concern for recurrent vs. residual cancer with stable/slightly enlarged nodes.  IR consulted for lymph node biopsy at the request of Dr. Fredric Dine.  Case reviewed by Dr. Annamaria Boots who approves right neck adenopathy.  Will follow for possible biopsy early next week.  Patient to be NPO p MN Monday morning.   Brynda Greathouse, MS RD PA-C

## 2021-08-18 ENCOUNTER — Inpatient Hospital Stay (HOSPITAL_COMMUNITY): Payer: Medicaid Other

## 2021-08-18 DIAGNOSIS — B376 Candidal endocarditis: Secondary | ICD-10-CM | POA: Diagnosis not present

## 2021-08-18 LAB — CULTURE, BLOOD (ROUTINE X 2): Special Requests: ADEQUATE

## 2021-08-18 MED ORDER — KETOROLAC TROMETHAMINE 15 MG/ML IJ SOLN
30.0000 mg | Freq: Four times a day (QID) | INTRAMUSCULAR | Status: DC | PRN
  Administered 2021-08-19 – 2021-08-20 (×3): 30 mg via INTRAVENOUS
  Filled 2021-08-18 (×3): qty 2

## 2021-08-18 MED ORDER — IOHEXOL 300 MG/ML  SOLN
75.0000 mL | Freq: Once | INTRAMUSCULAR | Status: AC | PRN
  Administered 2021-08-18: 75 mL via INTRAVENOUS

## 2021-08-18 NOTE — Progress Notes (Signed)
Subjective:   Hospital day 42.  Interval events: No interval events.  Patient evaluated at the bedside laying comfortably in bed. He reports he has been informed about plan for CT chest and biopsy of his cervical lymph nodes on Monday.  No complaints this morning.  When he was informed about the visit from Dr. Isaias Cowman yesterday, he stated he does not remember speaking with her and was sleepy yesterday afternoon due to not sleeping much the night before.  Objective:  Vital signs: Blood pressure (!) 103/57, pulse 81, temperature 98.1 F (36.7 C), temperature source Oral, resp. rate 19, height '5\' 11"'$  (1.803 m), weight 41.9 kg, SpO2 96 %.  Physical exam: Constitutional: Pleasant, chronically ill and cachectic middle-age man. NAD. HEENT: Trach collar with Shiley size 4 trach, patent. Pulmonary: Normal effort. Anterior lung sounds clear to auscultation Abdomen: PEG tube left lower quadrant. Skin: No rashes or lesions on visible skin. Psych: Appropriate mood and affect.   Intake/Output Summary (Last 24 hours) at 08/18/2021 0634 Last data filed at 08/17/2021 2201 Gross per 24 hour  Intake 2708.84 ml  Output 900 ml  Net 1808.84 ml    Pertinent Labs:    Latest Ref Rng & Units 08/16/2021    5:40 AM 08/13/2021    3:01 AM 08/10/2021   10:25 AM  CBC  WBC 4.0 - 10.5 K/uL 7.9  7.1  5.9   Hemoglobin 13.0 - 17.0 g/dL 11.0  9.9  10.4   Hematocrit 39.0 - 52.0 % 33.0  30.3  32.2   Platelets 150 - 400 K/uL 186  136  104        Latest Ref Rng & Units 08/16/2021    5:40 AM 08/13/2021    3:01 AM 08/10/2021   10:25 AM  CMP  Glucose 70 - 99 mg/dL 125  107  109   BUN 6 - 20 mg/dL '19  29  31   '$ Creatinine 0.61 - 1.24 mg/dL 0.44  0.58  0.63   Sodium 135 - 145 mmol/L 133  132  134   Potassium 3.5 - 5.1 mmol/L 4.4  4.8  4.9   Chloride 98 - 111 mmol/L 100  95  100   CO2 22 - 32 mmol/L '24  25  26   '$ Calcium 8.9 - 10.3 mg/dL 9.3  9.1  9.3   Total Protein 6.5 - 8.1 g/dL  6.8  7.5    Total Bilirubin 0.3 - 1.2 mg/dL  0.5  0.6   Alkaline Phos 38 - 126 U/L  93  146   AST 15 - 41 U/L  48  88   ALT 0 - 44 U/L  49  68    Blood cultures from 08/12/2021 still no growth after 6 days  Assessment/Plan:   Principal Problem:   Candidal endocarditis Active Problems:   Laryngeal cancer (HCC)   Goals of care, counseling/discussion   Protein-calorie malnutrition, severe   Tracheostomy status (HCC)   Pressure injury of skin   S/P percutaneous endoscopic gastrostomy (PEG) tube placement ([place])   Back pain   Patient Summary: David [name] is a 46 y.o. 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 Hospital course complicated by endocarditis and candidemia.  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  Patient has been afebrile for the last 48 hours. Blood cultures remain negative x6 days. -Continue IV micafungin until 8/27, if discharging, can transition to fluconazole  800 mg daily. -After 8/27, transition to fluconazole 400 mg daily for 3-6 months, then eventually on 200 mg daily -ID clinic f/u at 8/30 '@11'$ :15 am with Dr. Gale Journey --Continue to follow cultures --Outpatient ophthalmology appt before discharge to rule out intraocular infection.  History of stage IVB SCC of supraglottic region, postradiation Tracheostomy dependent, trach placed 5/13 Possible recurrent versus residual tumor Discussed recent CT neck finding with ENT yesterday. ENT discussed with rad onc and recommended CT chest with contrast as well as ultrasound-guided biopsy of the enlarged cervical lymph nodes due to concern for recurrent disease.  Patient has missed his postradiation PET scan due to his prolonged hospitalization. --CT chest with contrast --US guided biopsy of cervical  lymph node on Monday --Continue routine trach care --Pain control with PRN oxycodone 5 mg q6h, IV Toradol 30 mg q6h and PRN tylenol 650 mg po TID  --Continue PT/OT --Continue PRN hypertonic nebs q8h and Robitussin q6h for secretions - Continue prn Chloraseptic spray and lidocaine --Follow up with ENT on 8/03 for laryngoscopy  Severe protein calorie malnutrition PEG tube dependent Last weight 7/10 41.9 kg (92.3 lbs). Patient has been tolerating some p.o. intakes over the last few weeks. We will plan to repeat a weight today. --RD following, appreciate recs --Continue to encourage p.o. intake --Continue night feeding --PRN zofran 4 mg   Chronic hypotension Blood pressure continues to be stable off of midodrine.  SBP has been in range from 100s to 130s in the past 24 hours. --CTM   Social determinants of health Transitions of care Per CM, patient will have his apartment inspected on 7/24.  Patient's DME will be delivered on 7/25.  We will plan for discharge the next day. Patient's mother will help with transportation.  --CSW and CM following, appreciate assistance  Chronic back pain OUD and chronic methadone use Home regimen of methadone 50 mg daily modified to 20 mg 3 times daily to help with ongoing back pain. Doing well on current dose. --Continue methadone 20 mg p.o. 3 times daily  Anxiety Insomnia Chronic and stable - Continue duloxetine, trazodone, and Atarax.   Diet: Regular with thin liquids and night time tube feeds IVF: None,None VTE: Rivaroxaban TOC: apartment awaiting county inspection Code: Full   Dispo: Anticipated discharge to Home in 4 days pending treatment of candidemia and completion of apartment inspection.  Lacinda Axon, MD 08/18/2021, 6:34 AM  Pager: [phone] After 5pm on weekdays and 1pm on weekends: On Call pager: [phone]

## 2021-08-18 NOTE — Progress Notes (Signed)
Pharmacy Antibiotic Note  David [name] is a 46 y.o. male admitted on 06/06/2021 with bacteremia.  Pharmacy has been consulted for micafungin dosing.  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 WBC 7.9. Scr 0.44 (CrCl 68 mL/min). Afebrile.  Plan: Continue micafungin 150 mg IV every 24 hours x6 weeks or through discharge  Monitor clinical pic, cx results, and recommendations from [place] signing off consult, will follow peripherally.  Height: '5\' 11"'$  (180.3 cm) (from previous admission) Weight: 41.9 kg (92 lb 4.8 oz) IBW/kg (Calculated) : 75.3  Temp (24hrs), Avg:98.2 F (36.8 C), Min:97.7 F (36.5 C), Max:98.5 F (36.9 C)  Recent Labs  Lab 08/13/21 0301 08/16/21 0540  WBC 7.1 7.9  CREATININE 0.58* 0.44*     Estimated Creatinine Clearance: 68.4 mL/min (A) (by C-G formula based on SCr of 0.44 mg/dL (L)).    No Known Allergies  Antimicrobials this admission: Micafungin 7/15 >>   Dose adjustments this admission: N/A  Microbiology results: 7/14 BCx: yeast with pseudohyphae >> candida albican  7/16 Bcx: nf  Thank you for allowing pharmacy to be a part of this patient's care.  Francena Hanly, PharmD Pharmacy Resident  08/18/2021 8:54 AM

## 2021-08-19 NOTE — Plan of Care (Signed)
  Problem: Activity: Goal: Ability to tolerate increased activity will improve Outcome: Progressing   Problem: Clinical Measurements: Goal: Ability to maintain a body temperature in the normal range will improve Outcome: Progressing   Problem: Respiratory: Goal: Ability to maintain adequate ventilation will improve Outcome: Progressing   Problem: Clinical Measurements: Goal: Cardiovascular complication will be avoided Outcome: Progressing   Problem: Activity: Goal: Risk for activity intolerance will decrease Outcome: Progressing   Problem: Nutrition: Goal: Adequate nutrition will be maintained Outcome: Progressing   Problem: Elimination: Goal: Will not experience complications related to urinary retention Outcome: Progressing

## 2021-08-19 NOTE — Consult Note (Signed)
Chief Complaint: Patient was seen in consultation today for enlarged lymph node at the request of [place], Charisse March, MD  Referring Physician(s): Coy Saunas Charisse March, MD  Supervising Physician: Markus Daft  Patient Status: [facility] - In-pt  History of Present Illness: David [name] is a 46 y.o. male with history of supraglottic cancer who was admitted for respiratory collapse and protein calorie malnutrition, now with tracheostomy and PEG tube. Hospital course complicated by endocarditis and candidemia.  His team is concerned for possible recurrent versus residual tumor secondary to enlarged cervical lymph nodes on CT. He had missed his postradiation PET scan due to his prolonged hospitalization.  The team is planning on cervical node biopsy and Contrast CT of chest.  Case was reviewed and approved by Dr. Annamaria Boots.  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 ([place])    Pneumococcal bacteremia 06/09/2021   Pneumothorax    Left lung spontaneous pneumothorax at age 66 yr     Past Surgical History:  Procedure Laterality Date   chest tubes Left    IR GASTROSTOMY TUBE MOD SED  06/18/2021   LAPAROSCOPIC INSERTION GASTROSTOMY TUBE N/A 06/20/2021   Procedure: LAPAROSCOPIC INSERTION GASTROSTOMY TUBE;  Surgeon: Clovis Riley, MD;  Location: [place];  Service: General;  Laterality: N/A;  DOW PATIENT   TRACHEOSTOMY TUBE PLACEMENT N/A 06/09/2021   Procedure: AWAKE TRACHEOSTOMY;  Surgeon: Melida Quitter, MD;  Location: [place];  Service: ENT;  Laterality: N/A;    Allergies: Patient has no known allergies.  Medications: Prior to Admission  medications   Medication Sig Start Date End Date Taking? Authorizing Provider  carvedilol (COREG) 6.25 MG tablet Take 1 tablet (6.25 mg total) by mouth 2 (two) times daily. 04/25/21 04/25/22 Yes Delene Ruffini, MD  clotrimazole (MYCELEX) 10 MG troche Take 1 tablet (10 mg total) by mouth 5 (five) times daily. Suck on these slowly. 02/26/21  Yes [name], MD  guaiFENesin (MUCINEX) 600 MG 12 hr tablet Take 2 tablets (1,200 mg total) by mouth 2 (two) times daily. Patient taking differently: Take 1,200 mg by mouth 2 (two) times daily as needed for cough or to loosen phlegm. 02/26/21  Yes Pickenpack-Cousar, Athena N, NP  lidocaine (XYLOCAINE) 2 % solution Patient: Mix 1part 2% viscous lidocaine, 1part H20. Swish/swallow 40m of diluted mixture, 343m before meals and '@bedtime'$ , up to QID prn soreness 02/26/21  Yes SqEppie GibsonMD  methadone (DOLOPHINE) 10 MG/ML solution Take 6 mLs (60 mg total) by mouth daily. Patient taking differently: Take 50 mg by mouth daily. 01/08/21  Yes Pickenpack-Cousar, AtCarlena SaxNP  ondansetron (ZOFRAN) 8 MG tablet Take 1 tablet (8 mg total) by mouth every 8 (eight) hours as needed for nausea or vomiting. Starting day 3 after chemotherapy 05/17/21  Yes Iruku, PrArletha PiliMD  nicotine (NICODERM CQ - DOSED IN MG/24 HOURS) 14 mg/24hr patch Place 1 patch (14 mg total) onto the skin daily. Apply 21 mg patch daily x 6 wk, then '14mg'$  patch daily x 2 wk, then 7 mg patch daily x 2 wk Patient not taking: Reported on 06/06/2021 02/26/21   SqEppie GibsonMD  sacubitril-valsartan (ENTRESTO) 49-51 MG Take 1  tablet by mouth 2 (two) times daily. Patient not taking: Reported on 06/06/2021 04/25/21   Delene Ruffini, MD  traMADol (ULTRAM) 50 MG tablet Take 1 tablet (50 mg total) by mouth 2 (two) times daily. Patient not taking: Reported on 06/06/2021 02/26/21   Pickenpack-Cousar, Carlena Sax, NP     History reviewed. No pertinent family history.  Social History   Socioeconomic History   Marital  status: Divorced    Spouse name: Not on file   Number of children: Not on file   Years of education: Not on file   Highest education level: Not on file  Occupational History   Not on file  Tobacco Use   Smoking status: Every Day    Packs/day: 3.00    Types: Cigarettes   Smokeless tobacco: Never  Vaping Use   Vaping Use: Never used  Substance and Sexual Activity   Alcohol use: Not Currently    Alcohol/week: 12.0 standard drinks of alcohol    Types: 12 Cans of beer per week   Drug use: Not Currently    Types: IV    Comment: 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)   Sexual activity: Not on file  Other Topics Concern   Not on file  Social History Narrative   Not on file   Social Determinants of Health   Financial Resource Strain: Not on file  Food Insecurity: Not on file  Transportation Needs: Not on file  Physical Activity: Not on file  Stress: Not on file  Social Connections: Not on file    Review of Systems: not performed  Vital Signs: BP (!) 117/51   Pulse 81   Temp 98 F (36.7 C) (Oral)   Resp 18   Ht '5\' 11"'$  (1.803 m) Comment: from previous admission  Wt 91 lb 14.9 oz (41.7 kg)   SpO2 99%   BMI 12.82 kg/m   Physical Exam Vitals reviewed.  Constitutional:      Appearance: He is cachectic. He is ill-appearing.  HENT:     Mouth/Throat:     Pharynx: Oropharynx is clear.  Eyes:     Extraocular Movements: Extraocular movements intact.  Cardiovascular:     Rate and Rhythm: Normal rate.  Pulmonary:     Comments: On trach and vent Skin:    Coloration: Skin is pale.  Neurological:     General: No focal deficit present.     Mental Status: He is alert and oriented to person, place, and time.  Psychiatric:        Mood and Affect: Mood normal.        Behavior: Behavior normal.     Imaging: CT CHEST W CONTRAST  Result Date: 08/18/2021 CLINICAL DATA:  Laryngeal cancer evaluate for metastatic disease in the chest, shortness of breath  respiratory distress * Tracking Code: BO * EXAM: CT CHEST WITH CONTRAST TECHNIQUE: Multidetector CT imaging of the chest was performed during intravenous contrast administration. RADIATION DOSE REDUCTION: This exam was performed according to the departmental dose-optimization program which includes automated exposure control, adjustment of the mA and/or kV according to patient size and/or use of iterative reconstruction technique. CONTRAST:  34m OMNIPAQUE IOHEXOL 300 MG/ML  SOLN COMPARISON:  PET-CT, 05/14/2021 FINDINGS: Cardiovascular: Aortic atherosclerosis. Normal heart size. No pericardial effusion. Mediastinum/Nodes: No enlarged mediastinal, hilar, or axillary lymph nodes. Thyroid gland, trachea, and esophagus demonstrate no significant findings. Lungs/Pleura: Tracheostomy. Status post wedge resection of the anterior left upper lobe. New, extensive, dependent bibasilar scarring bronchiectasis, most  notably in the right lower lobe (series 4, image 97). Extensive, clustered centrilobular and tree-in-bud nodularity throughout the bilateral lung bases. Mild underlying paraseptal emphysema. Upper Abdomen: No acute abnormality.  Mild splenomegaly. Musculoskeletal: No chest wall abnormality. No suspicious osseous lesions identified. IMPRESSION: 1. New, extensive, dependent bibasilar scarring bronchiectasis, most notably in the right lower lobe. Extensive, clustered centrilobular and tree-in-bud nodularity throughout the bilateral lung bases. Findings are most consistent with aspiration and associated chronic sequelae. 2. No evidence of lymphadenopathy or metastatic disease in the chest. 3. Status post wedge resection of the anterior left upper lobe. 4. Tracheostomy. 5. Emphysema. Aortic Atherosclerosis (ICD10-I70.0) and Emphysema (ICD10-J43.9). Electronically Signed   By: Delanna Ahmadi M.D.   On: 08/18/2021 16:54   ECHOCARDIOGRAM LIMITED  Result Date: 08/12/2021    ECHOCARDIOGRAM REPORT   Patient Name:    David [name] Date of Exam: 08/12/2021 Medical Rec #:  [id number]            Height:       71.0 in Accession #:    [id number]           Weight:       92.3 lb Date of Birth:  1975/10/25            BSA:          1.518 m Patient Age:    32 years             BP:           146/95 mmHg Patient Gender: M                    HR:           76 bpm. Exam Location:  Inpatient Procedure: Limited Echo and Color Doppler Indications:    Fungemia  History:        Patient has prior history of Echocardiogram examinations.                 Polysubstance abuse on methadone. Tracheostomy.  Sonographer:    Merrie Roof RDCS Referring Phys: [facility]  Sonographer Comments: Technically challenging study due to limited acoustic windows, Technically difficult study due to poor echo windows, no apical window and no parasternal window. IMPRESSIONS  1. Nondiagnostic study due to very poor echo windows. Recommend TEE for further evaluation.  2. There is a large, highly mobile density measuring 1.2cmx1.1cm on the tricuspid valve highly concerning for vegetation. Recommend TEE for further evaluation. The tricuspid valve is abnormal.  3. Left ventricular ejection fraction, by estimation, is 55 to 60%. The left ventricle has normal function.  4. Right ventricular systolic function was not well visualized. The right ventricular size is not well visualized.  5. The mitral valve is grossly normal. Trivial mitral valve regurgitation.  6. The aortic valve was not well visualized.  7. Overall, very poor visualization, however, there is a large, mobile density on the triscupid valve highly concerning for vegetation. The other valves are incompletely visualized. Recommend TEE for further work-up Comparison(s): Compared to prior TTE on 05/2021, there is now a tricuspid valve mass concerning for vegetation. Other valves incompletely visualized on current study. FINDINGS  Left Ventricle: Left ventricular ejection fraction, by estimation, is 55 to 60%. The  left ventricle has normal function. Suboptimal image quality limits for assessment of left ventricular hypertrophy. Right Ventricle: The right ventricular size is not well visualized. Right vetricular wall thickness was not well visualized. Right ventricular systolic function was not well  visualized. Left Atrium: Left atrial size was not well visualized. Right Atrium: Right atrial size was not well visualized. Pericardium: There is no evidence of pericardial effusion. Mitral Valve: The mitral valve is grossly normal. Trivial mitral valve regurgitation. Tricuspid Valve: There is a large, highly mobile density measuring 1.2cmx1.1cm on the tricuspid valve highly concerning for vegetation. Recommend TEE for further evaluation. The tricuspid valve is abnormal. Aortic Valve: The aortic valve was not well visualized. Pulmonic Valve: The pulmonic valve was grossly normal. IAS/Shunts: The interatrial septum was not well visualized. Gwyndolyn Kaufman MD Electronically signed by Gwyndolyn Kaufman MD Signature Date/Time: 08/12/2021/7:43:36 PM    Final    CT SOFT TISSUE NECK W CONTRAST  Result Date: 08/11/2021 CLINICAL DATA:  New onset fever. Pain and soreness surrounding the tracheostomy. EXAM: CT NECK WITH CONTRAST TECHNIQUE: Multidetector CT imaging of the neck was performed using the standard protocol following the bolus administration of intravenous contrast. RADIATION DOSE REDUCTION: This exam was performed according to the departmental dose-optimization program which includes automated exposure control, adjustment of the mA and/or kV according to patient size and/or use of iterative reconstruction technique. CONTRAST:  66m OMNIPAQUE IOHEXOL 300 MG/ML  SOLN COMPARISON:  CT neck with contrast 07/24/2021 FINDINGS: Pharynx and larynx: Extensive radiation changes are noted. Tracheostomy tube is in place. Linear enhancement is noted along the right side of the residual supraglottic tissue measuring 17 x 5 mm on the coronal  images. Irregular bilateral enhancement is present at the level of the false cords with nodular density on the right. Diffuse edema again noted. Overall edema about the tracheostomy tube is decreased. Edema in the more superior supraglottic tissues and oral cavity has also decreased. Salivary glands: Submandibular and parotid glands are hyperdense, related to radiation therapy. No focal lesions are present. Thyroid: Unremarkable. Lymph nodes: Peripherally enhancing consolidated nodes the right level 2 station stable to slightly increased in size, now measuring 3.4 x 2.5 x 1.8 cm. A peripherally enhancing right lateral pharyngeal lymph node has decreased in size, now measuring 11 x 9 x 17 mm. An 8 mm right level 4 lymph node is stable. No new nodal disease is present. Vascular: Atherosclerotic changes are present at the bifurcations bilaterally without significant stenosis. Limited intracranial: Within normal limits. Visualized orbits: Bilateral lens replacements are noted. The globes and orbits are within normal limits. Mastoids and visualized paranasal sinuses: Bilateral lens replacements are noted. Globes and orbits are otherwise unremarkable. Skeleton: No focal osseous lesions are present. Upper chest: Airspace consolidation in the posterior right upper lobe is improved since the prior study. Post radiation changes are noted at the apex. IMPRESSION: 1. Decreased overall edema within the supraglottic neck and surrounding the tracheostomy tube. 2. No focal fluid collection to suggest abscess or infection. 3. Linear enhancement along the right side of the residual supraglottic and marked irregular enhancement at the level of the false cords tissue is concerning for residual or recurrent tumor. 4. Right level 2 and level 4 lymph nodes are stable to slightly increased in size. 5. No new nodal disease. 6. Improved airspace disease in the posterior right upper lobe. Electronically Signed   By: CSan MorelleM.D.    On: 08/11/2021 19:33   UKoreaAbdomen Limited RUQ (LIVER/GB)  Result Date: 08/10/2021 CLINICAL DATA:  Elevated LFTs. EXAM: ULTRASOUND ABDOMEN LIMITED RIGHT UPPER QUADRANT COMPARISON:  CT 06/11/2021 FINDINGS: Gallbladder: Moderately distended. No gallstones or wall thickening visualized. No sonographic Murphy sign noted by sonographer. Common bile duct: Diameter: 4-5 mm. Liver:  Mildly heterogeneous parenchymal echogenicity. No discrete focal lesion. Equivocal increase compared to right kidney. Portal vein is patent on color Doppler imaging with normal direction of blood flow towards the liver. Other: Trace perihepatic ascites. IMPRESSION: 1. Heterogeneous hepatic parenchymal echogenicity, no discrete focal lesion. 2. Trace perihepatic ascites. 3. Moderate gallbladder distention without gallstones or findings of acute cholecystitis. No biliary dilatation. Electronically Signed   By: Keith Rake M.D.   On: 08/10/2021 21:59   DG Abd 1 View  Result Date: 08/10/2021 CLINICAL DATA:  Cough and fever. EXAM: ABDOMEN - 1 VIEW COMPARISON:  CT 06/11/2021.  Radiographs 06/08/2021. FINDINGS: 1053 hours. A percutaneous G-tube overlaps the distal stomach. The visualized bowel gas pattern is nonobstructive, although the abdomen is relatively gasless. Two indeterminate calcifications projecting superior to the left L5 transverse process, not clearly seen on prior CT. The bones appear unremarkable. IMPRESSION: Percutaneous G-tube projects over the distal stomach. Indeterminate possible left paraspinal calcifications. Electronically Signed   By: Richardean Sale M.D.   On: 08/10/2021 11:27   DG Chest 2 View  Result Date: 08/10/2021 CLINICAL DATA:  Cough and fever. EXAM: CHEST - 2 VIEW COMPARISON:  Radiographs 06/08/2021, 06/07/2021 and 04/06/2021. FINDINGS: The PA view was repeated. Tracheostomy appears well positioned. Apparent faintly radiopaque port and catheter overlying the upper hemithorax on the frontal examination,  tip not clearly visualized. The heart size and mediastinal contours are stable with mild mediastinal shift to the right. Interval partial clearing of the right lung airspace opacities consistent with improving pneumonia. The left lung remains clear. There are postsurgical changes at the left lung apex. No evidence of pleural effusion or pneumothorax. IMPRESSION: 1. Interval partial clearing of multilobar pneumonia on the right. Continued follow-up recommended. 2. Satisfactory position of the tracheostomy. The tip of a left-sided port is not well visualized. Electronically Signed   By: Richardean Sale M.D.   On: 08/10/2021 11:25   DG Swallowing Func-Speech Pathology  Result Date: 07/25/2021 Table formatting from the original result was not included. Objective Swallowing Evaluation: Type of Study: MBS-Modified Barium Swallow Study  Patient Details Name: Adar Rase MRN: [id number] Date of Birth: 17-Jan-1976 Today's Date: 07/25/2021 Time: SLP Start Time (ACUTE ONLY): 1300 -SLP Stop Time (ACUTE ONLY): 1320 SLP Time Calculation (min) (ACUTE ONLY): 20 min Past Medical History: Past Medical History: Diagnosis Date  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[place])   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 ([place])   Pneumococcal bacteremia 06/09/2021  Pneumothorax   Left lung spontaneous pneumothorax at age 20 yr  Past Surgical History: Past Surgical History: Procedure Laterality Date  chest tubes Left   IR GASTROSTOMY TUBE MOD SED  06/18/2021  LAPAROSCOPIC INSERTION GASTROSTOMY TUBE N/A 06/20/2021  Procedure: LAPAROSCOPIC INSERTION GASTROSTOMY TUBE;  Surgeon: Clovis Riley, MD;  Location: [place];  Service: General;  Laterality: N/A;  DOW PATIENT  TRACHEOSTOMY TUBE PLACEMENT N/A 06/09/2021  Procedure: AWAKE TRACHEOSTOMY;  Surgeon: Melida Quitter, MD;  Location: [place];  Service: ENT;  Laterality: N/A; HPI: 47yo male with past medical history of laryngeal cancer s/p chemo and radiation and heroin abuse presented to ED on 06/06/21 from  home with complaints of shortness of breath and not being able to eat for several days. Patient was admitted on 06/06/2021 with acute hypoxic respiratory failure with sepsis secondary to aspiration pneumonia, elevated D-dimer, AKI. Developed worsening respiratory distress requiring emergent tracheostomy on 5/13. Patient got PEG on 06/20/21.  Subjective: pleasant, cooperative  Recommendations for follow up therapy are one component of a multi-disciplinary discharge  planning process, led by the attending physician.  Recommendations may be updated based on patient status, additional functional criteria and insurance authorization. Assessment / Plan / Recommendation   07/25/2021   3:11 PM Clinical Impressions Clinical Impression Patient presents with mild improvements in swallow function as compared to previous MBS on 5/26 however at that time he also had Cortrak feeding tube impacting swallow function. Main improvements noted during today's MBS are reduced pharyngeal residuals, improved consistency and effectiveness of using swallow strategies of head turn right during swallow and cough/throat clear and reswallow and decreased intensity of discomfort from penetration/aspiration. Patient independently used both strategies and this was effective to clear all observed penetrates which occured with nectar thick and thin liquid barium and cleared majority of aspirate. He did exhibit one instance of trace silent aspiration of thin liquid barium during this study. When SLP asked patient is comfort level when swallowing thin liquid barium with added requirement of swallow strategies, he candidly replied, "I've been doing it already" and that when he has a liquid "like a regular coke" he has to be more careful with his swallowing. SLP is recommending to liberalize patient's diet to allow thin liquids at this time as he is demonstrating consistent and effective use of learned swallow strategies to reduce his aspiration risk. SLP  will follow briefly to ensure education complete and patient comfortable with this change.     07/25/2021   2:41 PM Treatment Recommendations Treatment Recommendations Therapy as outlined in treatment plan below     07/25/2021   2:43 PM Prognosis Prognosis for Safe Diet Advancement Good   07/25/2021   2:41 PM Diet Recommendations SLP Diet Recommendations Regular solids;Thin liquid Liquid Administration via Cup Medication Administration Whole meds with puree Compensations Slow rate;Small sips/bites;Other (Comment);Clear throat after each swallow;Hard cough after swallow Postural Changes Seated upright at 90 degrees;Remain semi-upright after after feeds/meals (Comment)     07/25/2021   2:41 PM Other Recommendations Oral Care Recommendations Oral care BID;Patient independent with oral care Other Recommendations Have oral suction available Follow Up Recommendations Follow physician's recommendations for discharge plan and follow up therapies Assistance recommended at discharge Intermittent Supervision/Assistance Functional Status Assessment Patient has had a recent decline in their functional status and demonstrates the ability to make significant improvements in function in a reasonable and predictable amount of time.   07/25/2021   2:41 PM Frequency and Duration  Speech Therapy Frequency (ACUTE ONLY) min 1 x/week Treatment Duration 1 week     07/25/2021   2:37 PM Oral Phase Oral Phase [facility]    07/25/2021   2:37 PM Pharyngeal Phase Pharyngeal Phase Impaired Pharyngeal- Honey Cup Pharyngeal residue - valleculae;Pharyngeal residue - cp segment;Reduced pharyngeal peristalsis;Reduced epiglottic inversion Pharyngeal- Nectar Cup Reduced epiglottic inversion;Reduced airway/laryngeal closure;Penetration/Aspiration during swallow;Pharyngeal residue - valleculae;Pharyngeal residue - cp segment Pharyngeal Material enters airway, passes BELOW cords then ejected out Pharyngeal- Thin Cup Reduced epiglottic inversion;Reduced airway/laryngeal  closure;Pharyngeal residue - valleculae;Pharyngeal residue - cp segment;Penetration/Aspiration during swallow;Penetration/Apiration after swallow Pharyngeal Material enters airway, passes BELOW cords without attempt by patient to eject out (silent aspiration);Material enters airway, remains ABOVE vocal cords and not ejected out;Material enters airway, passes BELOW cords then ejected out;Material enters airway, CONTACTS cords and then ejected out Pharyngeal- Puree NT Pharyngeal- Regular NT    07/25/2021   2:40 PM Cervical Esophageal Phase  Cervical Esophageal Phase Impaired Honey Cup Reduced cricopharyngeal relaxation Nectar Cup Reduced cricopharyngeal relaxation Thin Cup Reduced cricopharyngeal relaxation Sonia Baller, MA, CCC-SLP Speech Therapy  CT SOFT TISSUE NECK W CONTRAST  Result Date: 07/24/2021 CLINICAL DATA:  Follow-up examination for head neck cancer. EXAM: CT NECK WITH CONTRAST TECHNIQUE: Multidetector CT imaging of the neck was performed using the standard protocol following the bolus administration of intravenous contrast. RADIATION DOSE REDUCTION: This exam was performed according to the departmental dose-optimization program which includes automated exposure control, adjustment of the mA and/or kV according to patient size and/or use of iterative reconstruction technique. CONTRAST:  58m OMNIPAQUE IOHEXOL 300 MG/ML  SOLN COMPARISON:  CT from 03/31/2021 as well as earlier studies. FINDINGS: Pharynx and larynx: Oral cavity and oropharynx remain negative. Nasopharynx negative other than small retropharyngeal lymph nodes as described below. Ill-defined soft tissue density seen involving the right greater than left supraglottic/glottic larynx again seen, consistent with known history of primary head neck cancer. The primary mass is somewhat more ill-defined and difficult to see as compared to previous, consistent with post treatment changes. Diffuse edema seen involving the  epiglottis, aryepiglottic folds, and right greater than left larynx, consistent with superimposed post treatment changes. The supraglottic airway is now largely obliterated (series 3, image 56). Tracheostomy tube in place within the subglottic airway. Salivary glands: Post treatment changes noted about the right greater than left carotid and submandibular glands. Salivary glands are otherwise unremarkable. Thyroid: Normal. Lymph nodes: Necrotic nodal conglomerate at right level II measures 3.1 x 1.9 cm, similar in size to previous. Small right greater than left retropharyngeal lymph nodes measure up to 1 cm in short axis (series 3, image 25). Necrotic left level II node measures 8 mm (series 3, image 48). Necrotic right level III node measures 5 mm (series 3, image 66). Few additional scattered bilateral level IV lymph nodes noted as well, largest of which measures 7 mm on the left (series 3, image 87). Overall size of these nodes is likely similar to previous, although a now demonstrate prominent peripheral enhancement and/or calcification, consistent with post treatment changes. Vascular: Normal intravascular enhancement seen within the neck. Limited intracranial: Unremarkable. Visualized orbits: Unremarkable. Mastoids and visualized paranasal sinuses: Tiny left maxillary sinus retention cyst noted. Visualized paranasal sinuses are otherwise clear. Trace right mastoid effusion noted. Skeleton: No discrete or worrisome osseous lesions. Upper chest: Emphysema. Consolidative opacity noted within the visualized posterior right lung, consistent with pneumonia, possibly reflecting changes of aspiration pneumonitis. Other: Diffuse soft tissue stranding noted within the subcutaneous fat of the right greater than left neck, compatible with post radiation changes. No loculated collections. IMPRESSION: 1. Ill-defined soft tissue density involving the right greater than left supraglottic/glottic larynx, consistent with known  history of primary head and neck cancer. The primary mass is more ill-defined and difficult to see as compared to previous, with superimposed edema now seen throughout the larynx, consistent with post treatment changes. The supraglottic airway is now largely obliterated as a result. Tracheostomy tube in appropriate position within the subglottic trachea. 2. Bilateral necrotic cervical adenopathy, with dominant 3.1 x 1.9 cm nodal conglomerate at right level II. These are likely similar in size and number as compared to previous exam, although also now demonstrate post treatment changes. 3. Diffuse soft tissue swelling and stranding within the right anterolateral neck, compatible with post radiation changes. No loculated collections. 4. Consolidative opacity within the visualized posterior right lung, consistent with pneumonia. Sequelae of aspiration pneumonitis could be considered given the patient's clinical picture and tracheostomy. 5. Emphysema (ICD10-J43.9). Electronically Signed   By: BJeannine BogaM.D.   On: 07/24/2021 20:48  Labs:  CBC: Recent Labs    07/26/21 0750 08/10/21 1025 08/13/21 0301 08/16/21 0540  WBC 8.6 5.9 7.1 7.9  HGB 11.9* 10.4* 9.9* 11.0*  HCT 36.3* 32.2* 30.3* 33.0*  PLT 273 104* 136* 186    COAGS: Recent Labs    12/08/20 1111 03/31/21 2104  INR 1.1 1.2  APTT  --  33    BMP: Recent Labs    07/24/21 1208 08/10/21 1025 08/13/21 0301 08/16/21 0540  NA 134* 134* 132* 133*  K 4.4 4.9 4.8 4.4  CL 96* 100 95* 100  CO2 '29 26 25 24  '$ GLUCOSE 117* 109* 107* 125*  BUN 21* 31* 29* 19  CALCIUM 9.4 9.3 9.1 9.3  CREATININE 0.57* 0.63 0.58* 0.44*  GFRNONAA >60 >60 >60 >60    LIVER FUNCTION TESTS: Recent Labs    06/06/21 1010 06/08/21 0418 08/10/21 1025 08/13/21 0301  BILITOT 1.0 0.4 0.6 0.5  AST 50* 42* 88* 48*  ALT 22 21 68* 49*  ALKPHOS 63 50 146* 93  PROT 7.2 4.9* 7.5 6.8  ALBUMIN 2.3* <1.5* 2.8* 2.6*    Assessment and Plan:  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  He voices desire to discuss PET scan with team before other testing or interventions are recommended.  He feels the swelling in his neck has receded.  He does not wish to discuss possible biopsy at this time.   --Order will be canceled for now.  IR available for re-consult as indicated.     Thank you for this interesting consult.  I greatly enjoyed meeting Jas Betten and look forward to participating in their care.  A copy of this report was sent to the requesting provider on this date.  Electronically Signed: Pasty Spillers, PA 08/19/2021, 11:34 AM   I spent a total of 15 minutes in face to face in clinical consultation, greater than 50% of which was counseling/coordinating care for enlarged cervical lymph nodes.

## 2021-08-19 NOTE — Progress Notes (Signed)
Subjective:   Hospital day 22.  Interval events: No interval events.  Patient evaluated at the bedside with RN in room giving medications through the PEG tube.  Patient has no acute complaints. Patient was informed of the results of his CT chest and plan for cervical node biopsy tomorrow.  RN to inform MD that patient's IV came out and had to get the IV team to put anyone in. RN later page team about patient's PEG tube being clogged.  MD went to the bedside and was able to declog PEG tube with coke.  Instructed RN to inform 19 to flush the PEG tube after given medications.  Objective:  Vital signs: Blood pressure (!) 117/51, pulse 81, temperature 98 F (36.7 C), temperature source Oral, resp. rate 18, height '5\' 11"'$  (1.803 m), weight 41.7 kg, SpO2 99 %.  Physical exam: Constitutional: Pleasant, chronically ill and cachectic middle-age man. NAD. HEENT: Trach collar with Shiley size 4 trach, patent. Pulmonary: Normal effort. Anterior lung sounds clear to auscultation Abdomen: PEG tube stable in LLQ Skin: No rashes or lesions on visible skin. Psych: Appropriate mood and affect.   Intake/Output Summary (Last 24 hours) at 08/19/2021 1245 Last data filed at 08/19/2021 0604 Gross per 24 hour  Intake 400 ml  Output 1735 ml  Net -1335 ml    Pertinent Labs:    Latest Ref Rng & Units 08/16/2021    5:40 AM 08/13/2021    3:01 AM 08/10/2021   10:25 AM  CBC  WBC 4.0 - 10.5 K/uL 7.9  7.1  5.9   Hemoglobin 13.0 - 17.0 g/dL 11.0  9.9  10.4   Hematocrit 39.0 - 52.0 % 33.0  30.3  32.2   Platelets 150 - 400 K/uL 186  136  104        Latest Ref Rng & Units 08/16/2021    5:40 AM 08/13/2021    3:01 AM 08/10/2021   10:25 AM  CMP  Glucose 70 - 99 mg/dL 125  107  109   BUN 6 - 20 mg/dL '19  29  31   '$ Creatinine 0.61 - 1.24 mg/dL 0.44  0.58  0.63   Sodium 135 - 145 mmol/L 133  132  134   Potassium 3.5 - 5.1 mmol/L 4.4  4.8  4.9   Chloride 98 - 111 mmol/L 100  95  100   CO2 22 -  32 mmol/L '24  25  26   '$ Calcium 8.9 - 10.3 mg/dL 9.3  9.1  9.3   Total Protein 6.5 - 8.1 g/dL  6.8  7.5   Total Bilirubin 0.3 - 1.2 mg/dL  0.5  0.6   Alkaline Phos 38 - 126 U/L  93  146   AST 15 - 41 U/L  48  88   ALT 0 - 44 U/L  49  68    Blood cultures from 08/12/2021 no growth after 5 days, final results  Assessment/Plan:   Principal Problem:   Candidal endocarditis Active Problems:   Laryngeal cancer (HCC)   Goals of care, counseling/discussion   Protein-calorie malnutrition, severe   Tracheostomy status (HCC)   Pressure injury of skin   S/P percutaneous endoscopic gastrostomy (PEG) tube placement ([place])   Back pain   Patient Summary: David [name] is a 46 y.o. 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 Hospital course complicated by endocarditis and candidemia.  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  Patient continues to be afebrile with no leukocytosis. He continues to have some issues with IV access.  IV team able to place 1 more PIV in today. We will continue for now and consider transition rest of his medications to p.o. or via tube if he loses access.  Getting close to discharge, we we will try to avoid a PICC line. -Continue IV micafungin until 8/27, if discharging, can transition to fluconazole  800 mg daily. -After 8/27, transition to fluconazole 400 mg daily for 3-6 months, then eventually on 200 mg daily -ID clinic f/u at 8/30 '@11'$ :15 am with Dr. Gale Journey --Continue to follow cultures --Outpatient ophthalmology appt before discharge to rule out intraocular infection.  History of stage IVB SCC of supraglottic region, postradiation Tracheostomy dependent, trach placed 5/13 Possible recurrent versus residual tumor Patient has missed his  postradiation PET scan due to his prolonged hospitalization.  CT chest yesterday showed extensive, bibasilar scarring and bronchiectasis in the RLL but no evidence of lymphadenopathy or metastatic disease in the chest.  IR saw patient this morning to prep for biopsy of cervical lymph node, however patient does not want the biopsy and would like to discuss getting a PET scan before any further procedures. --US guided biopsy of cervical lymph node canceled by IR --Continue routine trach care --Pain control with PRN oxycodone 5 mg q6h, IV Toradol 30 mg q6h and PRN tylenol 650 mg po TID  --Continue PT/OT --Continue PRN hypertonic nebs q8h and Robitussin q6h for secretions - Continue prn Chloraseptic spray and lidocaine --Follow up with ENT on 8/03 for laryngoscopy  Severe protein calorie malnutrition PEG tube dependent Patient's weight has been stable for the past 2 weeks. Weight on 7/22 was 41.7 kg (91.9 lbs).  He continues to tolerate some p.o. intake. --RD following, appreciate recs --Continue to encourage p.o. intake --Continue night feeding --PRN zofran 4 mg   Chronic hypotension Blood pressure continues to be stable off of midodrine. Blood pressure remained stable with SBP in the 110s to 120s over the last 48 hours. --CTM   Social determinants of health Transitions of care Per CM, patient will have his apartment inspected on 7/24.  Patient's DME will be delivered on 7/25. We will plan for discharge the next day. Patient's mother will help with transportation.  --CSW and CM following, appreciate assistance  Chronic back pain OUD and chronic methadone use Home regimen of methadone 50 mg daily modified to 20 mg 3 times daily to help with ongoing back pain. Doing well on current dose. --Continue methadone 20 mg p.o. 3 times daily  Anxiety Insomnia Chronic and stable - Continue duloxetine, trazodone, and Atarax.   Diet: Regular with thin liquids and night time tube feeds IVF:  None,None VTE: Rivaroxaban TOC: apartment awaiting county inspection Code: Full   Dispo: Anticipated discharge to Home in 4 days pending treatment of candidemia and completion of apartment inspection.  Lacinda Axon, MD 08/19/2021, 12:45 PM  Pager: [phone] After 5pm on weekdays and 1pm on weekends: On Call pager: [phone]

## 2021-08-19 NOTE — Progress Notes (Signed)
A consult was placed to IV Therapy again today for another iv restart;  iv that was started yesterday is "sore" .  Both arms assessed with ultrasound; very poor venous access; significant scar tissue noted on multiple veins;  able to place a new iv on the Right anterior forearm with a 20ga catheter; flushed easily w NS;  upon completion of starting the iv, the bed was lowered, and in doing so, the phone slid between the mattress and the siderail, coming disconnected from the white charging wall plug in adapter; Pt became extremely upset, thinking the cell phone fell;   the pt then moved the siderail himself, and his cell phone fell on the floor. This was witnessed by the 2 IV Team nurses in the room;  Pt was handed his phone, and given his charger from off the floor.

## 2021-08-20 DIAGNOSIS — B376 Candidal endocarditis: Secondary | ICD-10-CM | POA: Diagnosis not present

## 2021-08-20 NOTE — Progress Notes (Signed)
CSW spoke with Jackelyn Poling at [facility] who states the patient's apartment is being inspected today at 1:30pm. After the inspection is completed, Jackelyn Poling states she will obtain a lease agreement from [employer] and she will bring a copy of the lease to be signed by the patient tomorrow morning. Debbie states the utilities in the apartment have been turned on and she is agreeable to be in the unit to receive the DME tomorrow after 1pm.  CSW spoke with Thedore Mins of [employer] to inform him of information.   CSW sent secure chat to attending and residents to request patient be switched to oral antibiotics prior to discharge.  Madilyn Fireman, MSW, LCSW Transitions of Care  Clinical Social Worker II [phone]

## 2021-08-20 NOTE — Progress Notes (Signed)
Subjective:   Hospital day 75.  Interval events: Replaced PIV.  No new complaints today.  Does not understand why a biopsy of his lymph node was ordered.  Would prefer to get a PET scan before undergoing any more procedures.  Looking forward to leaving the hospital.  Objective:  Vital signs: Blood pressure (!) 105/56, pulse 87, temperature 98.5 F (36.9 C), temperature source Oral, resp. rate 19, height '5\' 11"'$  (1.803 m), weight 41.7 kg, SpO2 97 %.  Physical exam: Constitutional: Frail-appearing male lying in bed.  In no apparent distress. Pulmonary: Normal work of breathing noted. Abdominal: G-tube in place. Skin: Warm and dry. Neuro: Alert and oriented. Psych: Pleasant.  Appropriate mood and affect.   Intake/Output Summary (Last 24 hours) at 08/20/2021 0630 Last data filed at 08/20/2021 0400 Gross per 24 hour  Intake 430 ml  Output 1050 ml  Net -620 ml    Pertinent Labs:    Latest Ref Rng & Units 08/16/2021    5:40 AM 08/13/2021    3:01 AM 08/10/2021   10:25 AM  CBC  WBC 4.0 - 10.5 K/uL 7.9  7.1  5.9   Hemoglobin 13.0 - 17.0 g/dL 11.0  9.9  10.4   Hematocrit 39.0 - 52.0 % 33.0  30.3  32.2   Platelets 150 - 400 K/uL 186  136  104        Latest Ref Rng & Units 08/16/2021    5:40 AM 08/13/2021    3:01 AM 08/10/2021   10:25 AM  CMP  Glucose 70 - 99 mg/dL 125  107  109   BUN 6 - 20 mg/dL '19  29  31   '$ Creatinine 0.61 - 1.24 mg/dL 0.44  0.58  0.63   Sodium 135 - 145 mmol/L 133  132  134   Potassium 3.5 - 5.1 mmol/L 4.4  4.8  4.9   Chloride 98 - 111 mmol/L 100  95  100   CO2 22 - 32 mmol/L '24  25  26   '$ Calcium 8.9 - 10.3 mg/dL 9.3  9.1  9.3   Total Protein 6.5 - 8.1 g/dL  6.8  7.5   Total Bilirubin 0.3 - 1.2 mg/dL  0.5  0.6   Alkaline Phos 38 - 126 U/L  93  146   AST 15 - 41 U/L  48  88   ALT 0 - 44 U/L  49  68    Assessment/Plan:   Principal Problem:   Candidal endocarditis Active Problems:   Laryngeal cancer (HCC)   Goals of care,  counseling/discussion   Protein-calorie malnutrition, severe   Tracheostomy status ([place])   Pressure injury of skin   S/P percutaneous endoscopic gastrostomy (PEG) tube placement (HCC)   Back pain   Patient Summary: David [name] is a 46 y.o. 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  Hospital course complicated by endocarditis and candidemia.  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  No leukocytosis.  PIV reestablished this morning.  Micafungin until discharge (anticipated for 08/22/2021).  At that point we will switch to oral fluconazole. - Continue IV micafungin - Upon discharge, fluconazole 800 mg p.o. daily until follow-up with ID 09/26/2021. - Outpatient ophthalmology appointment to evaluate for candidal endophthalmitis.  History of stage IVb  SCC of supraglottic region, post radiation and cisplatin Tracheostomy dependent Possible recurrent versus residual tumor Patient declines ultrasound-guided biopsy of the lymph node in question.  He would prefer PET scan before undergoing further procedures. - Continue routine trach care - Control with oxycodone 5 mg every 6 hours as needed, Toradol 30 mg IV every 6 hours as needed, Tylenol 650 mg 3 times daily - Hypertonic nebs every 8 hours as needed Robitussin every 6 hours as needed - Chloraseptic Spray and lidocaine as needed - Follow-up with ENT on 08/30/2021 for laryngoscopy  Severe protein calorie malnutrition PEG tube dependent Tube is clogging frequently.  Recommend copious flushing to follow med administration.  Bedside replacement if needed, as tube is 2 months old and ought to be well epithelialized by now. - RD following, appreciate their input - Encourage as much p.o. intake as patient can tolerate - Continue nighttime feeding - Zofran 4 mg p.o. as  needed  Chronic hypertension Blood pressure stable off midodrine.  Chronic back pain OUD and chronic methadone use - Continue methadone 20 mg p.o. 3 times daily  Anxiety Insomnia Chronic and stable - Continue duloxetine, trazodone, and Atarax  Social determinants of health Transitions of care Apartment being inspected today.  DME will be delivered 08/21/2021.  Patient medically ready for discharge on 08/22/2021.  Diet:  Night time tube feeds IVF: None,None VTE: Rivaroxaban Code: Full  Dispo: Anticipated discharge to Home in 2 days pending completion of IV antifungals.   [name] MD 08/20/2021, 6:30 AM  Pager: [phone] After 5pm on weekdays and 1pm on weekends: On Call pager: [phone]

## 2021-08-20 NOTE — Plan of Care (Signed)
  Problem: Activity: Goal: Ability to tolerate increased activity will improve Outcome: Progressing   

## 2021-08-20 NOTE — Progress Notes (Signed)
Nutrition Follow-up  DOCUMENTATION CODES:   Severe malnutrition in context of chronic illness, Underweight  INTERVENTION:   Continue TF via PEG: Osmolite 1.5 @ 85 mL/hr x 12 hours (1020 mL/day) 45 mL ProSource TF - TID 125 mL free water flush q6h Provides 1650 kcal, 97 gm of protein, and 1277 mL free water (TF + flush) daily. Continue Multivitamin w/ minerals daily  NUTRITION DIAGNOSIS:   Severe Malnutrition related to chronic illness (head and neck cancer) as evidenced by severe muscle depletion, severe fat depletion, percent weight loss (23% weight loss within 3 months).  Ongoing  GOAL:   Patient will meet greater than or equal to 90% of their needs  Progressing   MONITOR:   PO intake, Labs, Weight trends, TF tolerance  REASON FOR ASSESSMENT:   Consult Assessment of nutrition requirement/status, Enteral/tube feeding initiation and management  ASSESSMENT:   46 yo male admitted with respiratory failure, suspected aspiration PNA. 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  5/12 - Cortrak placed (tip post pyloric) 5/13 - TF started; Trach placed 5/15 - diet advanced to Dysphagia 3, Honey thick liquids 5/22 - attempted placement of PEG in IR (not placed 2/2 anatomy) 5/23 - Cortrak removed 5/24 - PEG placed 5/26 - diet advanced to regular, Honey Thick liquids 5/30 - transitioned to bolus feeds 6/13 - transitioned to nocturnal feeds 6/21 - trach downsized to #4 6/28 - MBS w/ SLP, advanced to thin liquids  Reviewed I/O's: -620 ml x 24 hours and +9 L since 08/06/21  UOP: 1.1 L x 24 hours  Spoke with pt at bedside, who is pleasant and in good spirits today. Observed lunch tray; pt consumed only a few bites of mac and cheese. Pt shares that he just started working on his tray and that his meal schedule "was messed up today" secondary to late breakfast tray.  Pt  consuming 25-50% of meals and is also snacking on protein shakes and snacks that his mother has been bringing in (muffins, chocolate pie).   Pt reports compliance with nocturnal feedings and denies any nausea, vomiting, or abdominal distention with TF. Discussed importance of continuing TF to help support improvement of nutritional status.   Wt has been stable over the past 2 weeks.   Per TOC notes, pt is approaching discharge; awaiting apartment inspection. Pt reports he is excited to discharge home soon.   Medications reviewed and include miralax and senokot.   Labs reviewed: Na: 133. CBGS: 89   Diet Order:   Diet Order             Diet regular Room service appropriate? Yes; Fluid consistency: Thin  Diet effective now                   EDUCATION NEEDS:   No education needs have been identified at this time  Skin:  Skin Assessment: Skin Integrity Issues: Skin Integrity Issues:: Stage II Stage II: Sacrum  Last BM:  08/19/21  Height:   Ht Readings from Last 1 Encounters:  06/07/21 _0  (1.803 m)    Weight:   Wt Readings from Last 1 Encounters:  08/18/21 41.7 kg    Ideal Body Weight:  78.2 kg  BMI:  Body mass index is 12.82 kg/m.  Estimated Nutritional Needs:   Kcal:  1500-1700  Protein:  65-75 gm  Fluid:  1.5-1.7 L    Loistine Chance, RD, LDN, CDCES  Registered Dietitian II Certified Diabetes Care and Education Specialist Please refer to [facility] for RD and/or RD on-call/weekend/after hours pager

## 2021-08-21 DIAGNOSIS — B376 Candidal endocarditis: Secondary | ICD-10-CM | POA: Diagnosis not present

## 2021-08-21 MED ORDER — FLUCONAZOLE 40 MG/ML PO SUSR
800.0000 mg | Freq: Every day | ORAL | Status: AC
Start: 2021-08-21 — End: 2021-08-21
  Administered 2021-08-21: 800 mg
  Filled 2021-08-21: qty 20

## 2021-08-21 MED ORDER — METHADONE HCL 10 MG PO TABS
60.0000 mg | ORAL_TABLET | Freq: Once | ORAL | Status: AC
  Administered 2021-08-22: 60 mg via ORAL
  Filled 2021-08-21: qty 6

## 2021-08-21 NOTE — Progress Notes (Signed)
Mobility Specialist - Progress Note   08/21/21 1129  Mobility  Activity Ambulated independently in hallway  Level of Assistance Independent  Assistive Device None  Distance Ambulated (ft) 220 ft  Activity Response Tolerated well  $Mobility charge 1 Mobility    Pt received in bed and agreeable to mobility. No c/o thoughout ambulation. Pt back to bed with call bell at side.   Larey Seat

## 2021-08-21 NOTE — Progress Notes (Signed)
Nocturnal feeding stopped at this time per MD order. Flushed w / 100 ml, pt tolerated well

## 2021-08-21 NOTE — Discharge Instructions (Addendum)
[facility] @ [place] through [facility] @ [phone]  [name]  You were admitted for respiratory failure and malnutrition.  Your hospital course was complicated by a heart infection.  We are discharging you home now that you are doing better. To help assist you on your road to recovery, I have written the following recommendations:   1.  Refer to the attached references for gastrostomy tube care and tracheostomy care.  2.  It is important that you continue taking your medications after you leave the hospital.  For your heart infection: - Continue taking fluconazole 800 mg (4 tablets) daily until your follow-up appointment with your infectious disease doctor.  For mouth and throat pain: - Continue using your lidocaine solution  For back pain: - Continue using lidocaine patch  For pain around tracheostomy and gastrostomy: - Continue using topical Voltaren gel  To help with your mood: - Continue taking duloxetine 60 mg (1 capsule) daily  3.  Perhaps the most important part of your recovery will be your follow-up appointments with your primary care physician, oncologist, ENT specialist, and infectious disease specialist.  It was a privilege to be a part of your hospital care team, and I hope you feel better as a result of your stay.  All the best, [name], MD

## 2021-08-21 NOTE — Progress Notes (Addendum)
11am: CSW spoke with Earnest Bailey at [facility] to answer questions regarding PCS service needs.   10:30am: CSW obtained sign form from chart and sent it to [place] at [place].  CSW spoke with patient at bedside to discuss his discharge plan. Patient states he and his mother have reconciled their relationship and that she will be assisting him at home at discharge. Patient states he needs assistance getting to his MAT appointment on Thursday at [facility]. Patient states he needs to be at [facility] between 5:30am and 10am - and requested to be picked up at 6:30am. Patient understands he will be receiving PCS services from an agency in [facility] after discharge. Patient stated he is ready for discharge tomorrow.   CSW spoke with Almyra Free at [facility] to arrange for pickup on Thursday morning for 6:30am. [facility] will complete the round trip ride for the patient. CSW provided patient with contact information for [facility] once his appointment is complete.  CSW spoke with Jackelyn Poling at [facility] who states the patient's apartment did pass inspection. She is willing to receive the DME in the apartment as soon as [employer] provides a delivery time.   8:30am: CSW spoke with MD and secretary to request the trach supply order form be completed.  Madilyn Fireman, MSW, LCSW Transitions of Care  Clinical Social Worker II [phone]

## 2021-08-21 NOTE — Progress Notes (Signed)
Subjective:   Hospital day 60.  Interval events: None.  Feeling well.  Looking forward to going home, but somewhat apprehensive.  Says that PEG tube is patent.  Objective:  Vital signs: Blood pressure 114/77, pulse 79, temperature 98.4 F (36.9 C), temperature source Oral, resp. rate 17, height '5\' 11"'$  (1.803 m), weight 41.7 kg, SpO2 99 %.  Physical exam: Constitutional: Frail-appearing male.  No apparent distress. Pulmonary: Normal work of breathing noted. Abdominal: PEG tube in place. Skin: Warm and dry. Extremities: PIV right medial AC with some erythema. Neuro: Alert and oriented. Psych: Pleasant.  Appropriate mood and affect.   Intake/Output Summary (Last 24 hours) at 08/21/2021 0628 Last data filed at 08/20/2021 2053 Gross per 24 hour  Intake 1020 ml  Output 200 ml  Net 820 ml    Pertinent Labs:    Latest Ref Rng & Units 08/16/2021    5:40 AM 08/13/2021    3:01 AM 08/10/2021   10:25 AM  CBC  WBC 4.0 - 10.5 K/uL 7.9  7.1  5.9   Hemoglobin 13.0 - 17.0 g/dL 11.0  9.9  10.4   Hematocrit 39.0 - 52.0 % 33.0  30.3  32.2   Platelets 150 - 400 K/uL 186  136  104        Latest Ref Rng & Units 08/16/2021    5:40 AM 08/13/2021    3:01 AM 08/10/2021   10:25 AM  CMP  Glucose 70 - 99 mg/dL 125  107  109   BUN 6 - 20 mg/dL '19  29  31   '$ Creatinine 0.61 - 1.24 mg/dL 0.44  0.58  0.63   Sodium 135 - 145 mmol/L 133  132  134   Potassium 3.5 - 5.1 mmol/L 4.4  4.8  4.9   Chloride 98 - 111 mmol/L 100  95  100   CO2 22 - 32 mmol/L '24  25  26   '$ Calcium 8.9 - 10.3 mg/dL 9.3  9.1  9.3   Total Protein 6.5 - 8.1 g/dL  6.8  7.5   Total Bilirubin 0.3 - 1.2 mg/dL  0.5  0.6   Alkaline Phos 38 - 126 U/L  93  146   AST 15 - 41 U/L  48  88   ALT 0 - 44 U/L  49  68    Assessment/Plan:   Principal Problem:   Candidal endocarditis Active Problems:   Laryngeal cancer (HCC)   Goals of care, counseling/discussion   Protein-calorie malnutrition, severe   Tracheostomy  status ([place])   Pressure injury of skin   S/P percutaneous endoscopic gastrostomy (PEG) tube placement (HCC)   Back pain   Patient Summary: David [name] is a 46 y.o. 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  Hospital course complicated by endocarditis and candidemia.  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 - Discontinue micafungin - Start fluconazole 800 mg p.o. daily until follow-up with ID 09/26/2021. - Arrange outpatient ophthalmology appointment to evaluate for candidal endophthalmitis.  History of stage IVb SCC of supraglottic region, post radiation and cisplatin Possible recurrent versus residual tumor Tracheostomy dependent Patient declines ultrasound-guided biopsy of the lymph node in question.  He would prefer PET scan before undergoing further procedures. - Continue routine  trach care - Control with oxycodone 5 mg every 6 hours as needed, Toradol 30 mg IV every 6 hours as needed, Tylenol 650 mg 3 times daily - Hypertonic nebs every 8 hours as needed Robitussin every 6 hours as needed - Chloraseptic Spray and lidocaine as needed - Follow-up with ENT on 08/30/2021 for laryngoscopy  Severe protein calorie malnutrition PEG tube dependent Tube is clogging frequently.  Recommend copious flushing to follow med administration.  Will consult general surgery for bedside replacement if needed. - RD following, appreciate their input - Encourage as much p.o. intake as patient can tolerate - Continue nighttime feeding - Zofran 4 mg p.o. as needed  Chronic hypotension Blood pressure stable off midodrine.  Chronic back pain OUD and chronic methadone use. - Continue methadone 20 mg p.o. 3 times daily  Anxiety Insomnia Chronic and stable - Continue duloxetine, trazodone, and  Atarax  Social determinants of health Transitions of care Apartment passed inspection.  DME to be delivered to patient's home today.  Transportation to patient's MAT appointment on Thursday has been arranged.  Greatly appreciate work by [facility] team. - For tomorrow, 60 mg methadone once prior to discharge.  Diet: Normal supplemented with night time tube feeds IVF: None,None VTE: Rivaroxaban Code: Full  Dispo: Anticipated discharge to Home in 1 days pending discharge planning.  [name] MD 08/21/2021, 6:28 AM  Pager: [phone] After 5pm on weekdays and 1pm on weekends: On Call pager: [phone]

## 2021-08-22 ENCOUNTER — Other Ambulatory Visit (HOSPITAL_COMMUNITY): Payer: Self-pay

## 2021-08-22 DIAGNOSIS — B376 Candidal endocarditis: Secondary | ICD-10-CM | POA: Diagnosis not present

## 2021-08-22 MED ORDER — THIAMINE HCL 100 MG PO TABS
100.0000 mg | ORAL_TABLET | Freq: Every day | ORAL | 2 refills | Status: DC
  Filled 2021-08-22: qty 90, 90d supply, fill #0

## 2021-08-22 MED ORDER — PANTOPRAZOLE SODIUM 40 MG PO TBEC
40.0000 mg | DELAYED_RELEASE_TABLET | Freq: Every day | ORAL | 3 refills | Status: DC
  Filled 2021-08-22: qty 30, 30d supply, fill #0

## 2021-08-22 MED ORDER — DICLOFENAC SODIUM 1 % EX GEL
2.0000 g | Freq: Four times a day (QID) | CUTANEOUS | 2 refills | Status: DC | PRN
  Filled 2021-08-22: qty 100, 30d supply, fill #0

## 2021-08-22 MED ORDER — FLUCONAZOLE 200 MG PO TABS
800.0000 mg | ORAL_TABLET | Freq: Every day | ORAL | 0 refills | Status: DC
  Filled 2021-08-22: qty 114, 28d supply, fill #0

## 2021-08-22 MED ORDER — ZINC OXIDE 12.8 % EX OINT
TOPICAL_OINTMENT | CUTANEOUS | 2 refills | Status: DC | PRN
  Filled 2021-08-22: qty 227, fill #0

## 2021-08-22 MED ORDER — POLYETHYLENE GLYCOL 3350 17 GM/SCOOP PO POWD
17.0000 g | Freq: Every day | ORAL | 3 refills | Status: DC
  Filled 2021-08-22: qty 238, 14d supply, fill #0

## 2021-08-22 MED ORDER — FOLIC ACID 1 MG PO TABS
1.0000 mg | ORAL_TABLET | Freq: Every day | ORAL | 2 refills | Status: DC
  Filled 2021-08-22: qty 90, 90d supply, fill #0

## 2021-08-22 MED ORDER — ADULT MULTIVITAMIN W/MINERALS CH
1.0000 | ORAL_TABLET | Freq: Every day | ORAL | 2 refills | Status: DC
  Filled 2021-08-22: qty 30, 30d supply, fill #0

## 2021-08-22 MED ORDER — SODIUM CHLORIDE 0.9 % IN NEBU
3.0000 mL | INHALATION_SOLUTION | Freq: Three times a day (TID) | RESPIRATORY_TRACT | 12 refills | Status: DC | PRN
Start: 2021-08-22 — End: 2021-12-19
  Filled 2021-08-22: qty 90, 10d supply, fill #0

## 2021-08-22 MED ORDER — LIDOCAINE 5 % EX PTCH
1.0000 | MEDICATED_PATCH | CUTANEOUS | 2 refills | Status: DC
  Filled 2021-08-22: qty 30, 30d supply, fill #0

## 2021-08-22 MED ORDER — CHLORHEXIDINE GLUCONATE 0.12% ORAL RINSE (MEDLINE KIT)
15.0000 mL | Freq: Two times a day (BID) | OROMUCOSAL | 0 refills | Status: DC
  Filled 2021-08-22: qty 473, 16d supply, fill #0

## 2021-08-22 MED ORDER — DULOXETINE HCL 60 MG PO CPEP
60.0000 mg | ORAL_CAPSULE | Freq: Every day | ORAL | 2 refills | Status: DC
  Filled 2021-08-22: qty 90, 90d supply, fill #0

## 2021-08-22 MED ORDER — SODIUM CHLORIDE 0.9% FLUSH
10.0000 mL | INTRAVENOUS | 5 refills | Status: DC | PRN
  Filled 2021-08-22: qty 200, 3d supply, fill #0

## 2021-08-22 MED ORDER — HYDROXYZINE HCL 25 MG PO TABS
100.0000 mg | ORAL_TABLET | Freq: Three times a day (TID) | ORAL | 5 refills | Status: DC | PRN
Start: 2021-08-22 — End: 2022-03-04
  Filled 2021-08-22: qty 60, 5d supply, fill #0

## 2021-08-22 MED ORDER — MELATONIN 3 MG PO TABS
3.0000 mg | ORAL_TABLET | Freq: Every evening | ORAL | 2 refills | Status: DC | PRN
  Filled 2021-08-22: qty 30, 30d supply, fill #0

## 2021-08-22 MED ORDER — PHENOL 1.4 % MT LIQD
1.0000 | OROMUCOSAL | 5 refills | Status: DC | PRN
  Filled 2021-08-22: qty 177, fill #0

## 2021-08-22 NOTE — Progress Notes (Signed)
This patient requires a hospital bed to keep his head elevated at 30 degrees. This is important for managing secretions to prevent complications with tracheostomy. The patient is able to independently operate the controls.

## 2021-08-22 NOTE — Discharge Summary (Signed)
Name: David [name] MRN: [id number] DOB: Oct 09, 1975 46 y.o. PCP: Delene Ruffini, MD  Date of Admission: 06/06/2021  9:46 AM Date of Discharge: 08/22/2021 2:51 PM Attending Physician: Axel Filler, *  Discharge Diagnosis: Principal Problem:   Candidal endocarditis Active Problems:   Laryngeal cancer ([place])   Goals of care, counseling/discussion   Protein-calorie malnutrition, severe   Tracheostomy status ([place])   Pressure injury of skin   S/P percutaneous endoscopic gastrostomy (PEG) tube placement ([place])   Back pain   Discharge Medications: Allergies as of 08/22/2021   No Known Allergies      Medication List     STOP taking these medications    carvedilol 6.25 MG tablet Commonly known as: Coreg   clotrimazole 10 MG troche Commonly known as: MYCELEX   Entresto 49-51 MG Generic drug: sacubitril-valsartan   traMADol 50 MG tablet Commonly known as: ULTRAM       TAKE these medications    chlorhexidine gluconate (MEDLINE KIT) 0.12 % solution Commonly known as: PERIDEX Rinse  mouth with 15 mLs 2 (two) times daily. Do not eat or drink for 30 minutes.   diclofenac Sodium 1 % Gel Commonly known as: VOLTAREN Apply 2 g topically 4 (four) times daily as needed (pain).   DULoxetine 60 MG capsule Commonly known as: CYMBALTA Take 1 capsule (60 mg total) by mouth daily.   fluconazole 200 MG tablet Commonly known as: DIFLUCAN Take 4 tablets (800 mg total) by mouth daily.   folic acid 1 MG tablet Commonly known as: FOLVITE Take 1 tablet (1 mg total) by mouth daily.   guaiFENesin 600 MG 12 hr tablet Commonly known as: MUCINEX Take 2 tablets (1,200 mg total) by mouth 2 (two) times daily. What changed:  when to take this reasons to take this   hydrOXYzine 25 MG tablet Commonly known as: ATARAX Place 4 tablets (100 mg total) into feeding tube 3 (three) times daily as needed for anxiety.   lidocaine 2 % solution Commonly known as:  XYLOCAINE Patient: Mix 1part 2% viscous lidocaine, 1part H20. Swish/swallow 62m of diluted mixture, 311m before meals and @bedtime , up to QID prn soreness   lidocaine 5 % Commonly known as: LIDODERM Place 1 patch onto the skin daily. Remove & Discard patch within 12 hours or as directed by MD   melatonin 3 MG Tabs tablet Take 1 tablet (3 mg total) by mouth at bedtime as needed.   methadone 10 MG/ML solution Commonly known as: DOLOPHINE Take 6 mLs (60 mg total) by mouth daily. What changed: how much to take   multivitamin with minerals Tabs tablet Place 1 tablet into feeding tube daily.   nicotine 14 mg/24hr patch Commonly known as: NICODERM CQ - dosed in mg/24 hours Place 1 patch (14 mg total) onto the skin daily. Apply 21 mg patch daily x 6 wk, then 1487match daily x 2 wk, then 7 mg patch daily x 2 wk   ondansetron 8 MG tablet Commonly known as: ZOFRAN Take 1 tablet (8 mg total) by mouth every 8 (eight) hours as needed for nausea or vomiting. Starting day 3 after chemotherapy   pantoprazole 40 MG tablet Commonly known as: Protonix Take 1 tablet (40 mg total) by mouth daily.   phenol 1.4 % Liqd Commonly known as: CHLORASEPTIC Use as directed 1 spray in the mouth or throat as needed for throat irritation / pain.   polyethylene glycol powder 17 GM/SCOOP powder Commonly known as: GLYCOLAX/MIRALAX Place 17 g in  water and drink daily.   sodium chloride 0.9 % nebulizer solution Take 3 mLs by nebulization every 8 (eight) hours as needed (Secretions).   sodium chloride flush 0.9 % Soln Commonly known as: NS 10-40 mLs by Intracatheter route as needed (flush).   thiamine 100 MG tablet Commonly known as: VITAMIN B1 Place 1 tablet (100 mg total) into feeding tube daily.   Zinc Oxide 12.8 % ointment Commonly known as: TRIPLE PASTE Apply topically as needed for irritation. Apply to trach breakdown site               Durable Medical Equipment  (From admission,  onward)           Start     Ordered   08/22/21 0029  For home use only DME Hospital bed  Once       Comments: Patient with a history of squamous cell carcinoma of the larynx, bronchiectasis, emphysema now trach and PEG tube-depending patient now has generalized deconditioning due to critical illness which requires his torso to be positioned in ways not feasible with a normal bed. Head must be elevated at least 30 degrees to decrease risk of aspiration. Patient can independently operate the controls of the bed.  Question Answer Comment  Length of Need Lifetime   Patient has (list medical condition): Squamous ceel carcinoma of the larynx s/p radiation, tracheostomy, emphysema, malnutrition, deconditioning due to critical illness   The above medical condition requires: Patient requires the ability to reposition frequently   Head must be elevated greater than: 30 degrees   Bed type Semi-electric      08/22/21 0041   08/16/21 1456  For home use only DME Tube feeding  Once       Comments:  Continue TF via PEG:  Osmolite 1.5 @ 85 mL/hr x 12 hours (1020 mL/day)  45 mL ProSource TF - TID  125 mL free water flush q6h  Provides 1650 kcal, 97 gm of protein, and 1277 mL free water (TF + flush) daily.  Continue Multivitamin w/ minerals daily   08/16/21 1455   07/31/21 1447  For home use only DME 4 wheeled rolling walker with seat  Once       Question:  Patient needs a walker to treat with the following condition  Answer:  Generalized weakness   07/31/21 1446   07/31/21 1447  For home use only DME Tube feeding pump  Once       Question:  Length of Need  Answer:  Lifetime   07/31/21 1447   07/31/21 1444  For home use only DME Trach supplies  (For Home Use Only DME Trach Supplies)  Once       Question Answer Comment  Trach Type Shiley   Back Up Trach Type Shiley   Cuffed or Uncuffed Uncuffed   Fenestrated No   Size 4 mm   XLT Length No   Trach Supplies/Equipment for Home Use Tube  Holder/Collar   Trach Supplies/Equipment for Home Use Tracheostomy Drain Dressings   Trach Supplies/Equipment for Home Use Medical Suction Machine   Trach Supplies/Equipment for Home Use Suction Catheters   Trach Supplies/Equipment for Home Use Tracheostomy Care Cleaning Kits   Trach Supplies/Equipment for Home Use Speaking Valve   Trach Supplies/Equipment for Home Use HME Device   Suction Catheter Size 10 French (for size 4)   Cleaning Kits 60      07/31/21 1445  Discharge Care Instructions  (From admission, onward)           Start     Ordered   08/22/21 0000  Discharge wound care:       Comments: As instructed   08/22/21 1424            Disposition and follow-up:   [name] is a 46 y.o. year old male admitted to [facility] for respiratory collapse and protein calorie malnutrition secondary to stage IV B squamous of carcinoma of the supraglottic region.  This condition required placement of tracheostomy and gastrostomy tubes.  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  They were discharged from [facility] on hospital day 77 in Stable condition.  At the hospital follow up visit please address:  Candidal tricuspid valve endocarditis - Ensure adherence to regimen of fluconazole 800 mg p.o. daily  Screening for candidal endophthalmitis - Patient has follow-up with ophthalmology on 08/27/2021  Concern for recurrence of squamous cell carcinoma - Ensure patient has close follow-up with oncology  Tracheostomy dependent  - Ensure patient has everything he needs for good tracheostomy care at home  Gastrostomy dependent - Ensure the tube is patent  Medication assisted therapy - Ensure patient has access to methadone clinic - Concurrent administration of fluconazole and methadone can result in QT interval prolongation and increased serum concentrations of methadone  Labs /  imaging needed at time of follow-up: - CBC - BMP - EKG to monitor QT interval due to concurrent administration of methadone and fluconazole  Follow-up Appointments:  Follow-up Information     Sur[facility], [place]. Call.   Specialty: General Surgery Why: As needed if issues with feeding tube Contact information: [address] [place] [address] [phone]         Delene Ruffini, MD. Go on 08/29/2021.   Specialty: Internal Medicine Why: Please follow up with your primary care physician on this date at 1:15 pm. Please arrive 15 minutes before the appointment. Contact information: [place] [address] [place], [place] [employer] Follow up.   Why: [employer] to provide dme including rollator, hospital bed,  trach supplies, suction equipment , tube feeding supplies and tube feeding pump. Contact information: [address] [place] [address] [phone]         [facility] Follow up.   Specialty: PALLIATIVE CARE Contact information: [place]. Schedule an appointment as soon as possible for a visit.   Why: Please follow up with the [facility] for your Outpatient Addiction Treatment Regimen. Contact information: [place], [place] [address]        [name], MD. Call.   Specialty: Hematology and Oncology Contact information: [place] [address] [phone]         Jalene Mullet, MD. Call in 2 day(s).   Specialty: Ophthalmology Contact information: [address] [place] [address] [phone]         Lacinda Axon, MD. Go in 6 day(s).   Specialty: Internal Medicine Contact information: [place] [address] [phone]         [name], [place], DO Follow up in 7 day(s).    Specialty: Otolaryngology Contact information: [address]  [place] [address] [phone]         Jabier Mutton, MD Follow up on 09/26/2021.   Specialty: Infectious Diseases Contact information: [address] [place] [address] [phone]                 Hospital Course by problem list:  Briefly, this 46 year old patient initially presented with acute hypoxic respiratory failure and severe malnutrition requiring prolonged ICU level treatment, including placement of tracheostomy and gastrostomy.  He was treated for endocarditis twice, with treatment for tricuspid valve candidal endocarditis ongoing upon discharge.  Tricuspid valve candidal endocarditis Work-up for fever in setting of previously treated streptococcal endocarditis showed a new, large vegetation on the tricuspid valve.  Blood cultures grew Candida albicans.  IV micafungin was started on 08/11/2021 and continued through 08/21/2021.  At that point the patient was transitioned to oral fluconazole in preparation for discharge.  He remained afebrile for duration of his remaining hospital course.  Protein calorie malnutrition, severe  Cortrak was placed. VIR consulted for PEG tube placement once patient was stable, but were unable to place PEG-tube due to patient's anatomy. General Surgery placed PEG-tube on 5/24. Patient was switched to bolus feeds on 5/31.  Discharged at 41.6 kg up from a nadir of 36.7 kg.  Acute hypoxic respiratory failure with sepsis secondary to aspiration pneumonia Tracheostomy dependent History of SCC of larynx  Initial blood cultures grew Strep pneumo. He rescinded his DNI/DNR request and was transferred to the ICU on BiPAP on 5/11. ENT was consulted for respiratory failure. He continued to desaturate into the 70s with bradycardia on full BiPAP support. ENT performed urgent tracheostomy. He was transferred back to the floor on 5/16. He completed a 5-day course  of ceftriaxone and Flagyl on 5/21 for aspiration pneumonia. ENT switched him to a cuffless trach on 5/30. He underwent routine trach care throughout admission with downsize on 06/21. He had new pain around his tracheostomy site and R anterior neck prompting rescan of head and neck on 06/27 which did not show obvious progression of disease. Oncology team reviewed new scan and recommended outpatient follow-up as plan. Pain and secretions were managed throughout stay with as needed medications as appropriate.   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   Strep pneumo bacteremia  Endocarditis  TTE notable for thickened anterior mitral leaflet with possible small hypermobile density concerning for vegetation. High concern for underlying endocarditis given history of IV drug use and TTE findings. Per Cardiology, patient not a candidate for TEE given his history of radiation. Infectious Disease was consulted, recommended empirically treating for endocarditis with IV ceftriaxone, planned for 4-week course starting at date of negative blood cultures. Cultures were negative on 5/12, patient finished IV ceftriaxone on 6/9.    Chronic hypotension  Patient remained persistently hypotensive during his stay with SBPs 80-90s. He was started on midodrine 10 mg TID.   Polysubstance use disorder  Patient was continued on methadone 50 mg daily and started on nicotine patch.   Anxiety Depression  Insomnia Patient received duloxetine 60 mg, hydroxyzine 100 mg q6h PRN, and trazodone 100 mg qhs.   Chronic hepatitis C infection  HCV RNA load 792,000 on admission, indicative of chronic infection. ID recommended outpatient treatment.  Goals of Care Patient initially expressed desire to be DNR/DNI in the ED. Palliative care team was consulted for assistance, patient then changed his code status to full code stating  that he wanted to do everything possible to prolong his  life and to get better and go home.  Discharge Exam:  Subjective: Patient feeling well.  Healthy level of apprehension about going home.  No new complaints today.   Blood pressure 111/69, pulse 88, temperature 98.8 F (37.1 C), temperature source Oral, resp. rate 16, height 5' 11"  (1.803 m), weight 41.6 kg, SpO2 97 %. Constitutional: Frail-appearing male.  No apparent distress. HEENT: Tracheostomy in place. Pulmonary: Normal work of breathing noted. Abdominal: PEG tube in place. Skin: Warm and dry. Extremities: PIV right medial AC with some erythema. Neuro: Alert and oriented. Psych: Pleasant.  Appropriate mood and affect.  Pertinent studies and procedures:  TTE 08/12/2021: There is a large, highly mobile density measuring 1.2cmx1.1cm on the  tricuspid valve highly concerning for vegetation. Recommend TEE for  further evaluation. The tricuspid valve is abnormal.   Left ventricular ejection fraction, by estimation, is 55 to 60%. The  left ventricle has normal function.  CT Neck w contrast 08/11/2021: 1. Decreased overall edema within the supraglottic neck and surrounding the tracheostomy tube. 2. No focal fluid collection to suggest abscess or infection. 3. Linear enhancement along the right side of the residual supraglottic and marked irregular enhancement at the level of the false cords tissue is concerning for residual or recurrent tumor. 4. Right level 2 and level 4 lymph nodes are stable to slightly increased in size. 5. No new nodal disease. 6. Improved airspace disease in the posterior right upper lobe.  CT chest with contrast 08/18/2021: 1. New, extensive, dependent bibasilar scarring bronchiectasis, most notably in the right lower lobe. Extensive, clustered centrilobular and tree-in-bud nodularity throughout the bilateral lung bases. Findings are most consistent with aspiration and associated chronic sequelae. 2. No evidence of lymphadenopathy or metastatic disease in  the chest. 3. Status post wedge resection of the anterior left upper lobe. 4. Tracheostomy. 5. Emphysema.  Discharge Instructions:   Discharge Instructions      Safe Transport @ [phone]  Personal Care Services through [facility] @ [phone]  [name]  You were admitted for respiratory failure and malnutrition.  Your hospital course was complicated by a heart infection.  We are discharging you home now that you are doing better. To help assist you on your road to recovery, I have written the following recommendations:   1.  Refer to the attached references for gastrostomy tube care and tracheostomy care.  2.  It is important that you continue taking your medications after you leave the hospital.  For your heart infection: - Continue taking fluconazole 800 mg (4 tablets) daily until your follow-up appointment with your infectious disease doctor.  For mouth and throat pain: - Continue using your lidocaine solution  For back pain: - Continue using lidocaine patch  For pain around tracheostomy and gastrostomy: - Continue using topical Voltaren gel  To help with your mood: - Continue taking duloxetine 60 mg (1 capsule) daily  3.  Perhaps the most important part of your recovery will be your follow-up appointments with your primary care physician, oncologist, ENT specialist, and infectious disease specialist.  It was a privilege to be a part of your hospital care team, and I hope you feel better as a result of your stay.  All the best, [name], MD     Signed: [name] MD 08/22/2021, 2:51 PM   Pager: [phone]

## 2021-08-22 NOTE — Progress Notes (Signed)
Patient given discharge instructions and stated understanding. 

## 2021-08-22 NOTE — Plan of Care (Signed)
  Problem: Activity: Goal: Ability to tolerate increased activity will improve Outcome: Adequate for Discharge   Problem: Clinical Measurements: Goal: Ability to maintain a body temperature in the normal range will improve Outcome: Adequate for Discharge   Problem: Respiratory: Goal: Ability to maintain adequate ventilation will improve Outcome: Adequate for Discharge Goal: Ability to maintain a clear airway will improve Outcome: Adequate for Discharge   Problem: Malnutrition  (NI-5.2) Goal: Food and/or nutrient delivery Description: Individualized approach for food/nutrient provision. Outcome: Adequate for Discharge   Problem: Acute Rehab PT Goals(only PT should resolve) Goal: Patient Will Transfer Sit To/From Stand Outcome: Adequate for Discharge Goal: Pt Will Ambulate Outcome: Adequate for Discharge   Problem: Education: Goal: Knowledge of General Education information will improve Description: Including pain rating scale, medication(s)/side effects and non-pharmacologic comfort measures Outcome: Adequate for Discharge   Problem: Health Behavior/Discharge Planning: Goal: Ability to manage health-related needs will improve Outcome: Adequate for Discharge   Problem: Clinical Measurements: Goal: Ability to maintain clinical measurements within normal limits will improve Outcome: Adequate for Discharge Goal: Will remain free from infection Outcome: Adequate for Discharge Goal: Diagnostic test results will improve Outcome: Adequate for Discharge Goal: Respiratory complications will improve Outcome: Adequate for Discharge Goal: Cardiovascular complication will be avoided Outcome: Adequate for Discharge   Problem: Activity: Goal: Risk for activity intolerance will decrease Outcome: Adequate for Discharge   Problem: Nutrition: Goal: Adequate nutrition will be maintained Outcome: Adequate for Discharge   Problem: Coping: Goal: Level of anxiety will decrease Outcome:  Adequate for Discharge   Problem: Elimination: Goal: Will not experience complications related to bowel motility Outcome: Adequate for Discharge Goal: Will not experience complications related to urinary retention Outcome: Adequate for Discharge   Problem: Pain Managment: Goal: General experience of comfort will improve Outcome: Adequate for Discharge   Problem: Safety: Goal: Ability to remain free from injury will improve Outcome: Adequate for Discharge   Problem: Skin Integrity: Goal: Risk for impaired skin integrity will decrease Outcome: Adequate for Discharge   Problem: Acute Rehab PT Goals(only PT should resolve) Goal: Pt Will Go Up/Down Stairs Outcome: Adequate for Discharge

## 2021-08-22 NOTE — Progress Notes (Addendum)
2:40pm: CSW delivered money to [facility] for medications.  CSW spoke with [facility] at [place] to inform her of discharge plan.  1:20pm: CSW spoke with [place] at [facility] who states she is going to receive the DME in the patient's apartment as this time.  CSW notified MD and RN of information.  CSW spoke with [facility] and they do not have any availability at this time.  CSW spoke with patient's mother who states she will provide the patient with transportation home.  10:55am: CSW spoke with Thedore Mins at [place] who states the DME has been loaded onto the delivery truck but there is no ETA available at this time.  9am: CSW spoke with [place] at [facility] to provide her with information to support the patient's [facility] service initiation.  CSW spoke with Thedore Mins at [facility] to inform him that the necessary note was now in the chart.  Madilyn Fireman, MSW, LCSW Transitions of Care  Clinical Social Worker II [phone]

## 2021-08-24 ENCOUNTER — Inpatient Hospital Stay: Payer: Medicaid Other | Attending: Radiation Oncology | Admitting: Hematology and Oncology

## 2021-08-24 ENCOUNTER — Other Ambulatory Visit: Payer: Self-pay | Admitting: Hematology and Oncology

## 2021-08-24 ENCOUNTER — Inpatient Hospital Stay (HOSPITAL_BASED_OUTPATIENT_CLINIC_OR_DEPARTMENT_OTHER): Payer: Medicaid Other | Admitting: Nurse Practitioner

## 2021-08-24 ENCOUNTER — Encounter: Payer: Self-pay | Admitting: Hematology and Oncology

## 2021-08-24 ENCOUNTER — Other Ambulatory Visit: Payer: Self-pay

## 2021-08-24 ENCOUNTER — Telehealth: Payer: Self-pay

## 2021-08-24 DIAGNOSIS — C329 Malignant neoplasm of larynx, unspecified: Secondary | ICD-10-CM

## 2021-08-24 DIAGNOSIS — R634 Abnormal weight loss: Secondary | ICD-10-CM

## 2021-08-24 DIAGNOSIS — B370 Candidal stomatitis: Secondary | ICD-10-CM | POA: Diagnosis not present

## 2021-08-24 DIAGNOSIS — Z515 Encounter for palliative care: Secondary | ICD-10-CM

## 2021-08-24 DIAGNOSIS — Z930 Tracheostomy status: Secondary | ICD-10-CM | POA: Insufficient documentation

## 2021-08-24 DIAGNOSIS — Z9221 Personal history of antineoplastic chemotherapy: Secondary | ICD-10-CM | POA: Diagnosis not present

## 2021-08-24 DIAGNOSIS — F17210 Nicotine dependence, cigarettes, uncomplicated: Secondary | ICD-10-CM | POA: Insufficient documentation

## 2021-08-24 DIAGNOSIS — R630 Anorexia: Secondary | ICD-10-CM

## 2021-08-24 DIAGNOSIS — Z7189 Other specified counseling: Secondary | ICD-10-CM

## 2021-08-24 DIAGNOSIS — C328 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: Secondary | ICD-10-CM | POA: Diagnosis present

## 2021-08-24 DIAGNOSIS — Z923 Personal history of irradiation: Secondary | ICD-10-CM | POA: Diagnosis not present

## 2021-08-24 NOTE — Assessment & Plan Note (Signed)
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  He continues to have mucositis, but improving.  Physical examination with a palpable right cervical lymphadenopathy which is consistent with partial response so far His end of treatment PET did not show any evidence of residual disease.  He was most recently admitted with laryngeal edema, respiratory failure, fungal endocarditis, now discharged on fluconazole daily.  He is here for follow-up.  His most recent imaging showed evidence of linear enhancement along the right side of the supraglottic area and irregular enhancement at the level of the vocal cord concerning for residual or recurrent tumor.  He apparently refused a biopsy of this area however he tells me that he has no memory of it.  He is scheduled to see Dr. Isaias Cowman in the outpatient clinic and he is willing to proceed with biopsies if needed.  I will coordinate with ENT team about his follow-up.  He is not thought to be a good candidate for salvage surgery on her recurrent radiation.  He has several underlying social issues and may not be a good candidate for aggressive chemotherapy but we can attempt treatment if his nutritional status improves.

## 2021-08-24 NOTE — Telephone Encounter (Signed)
Spoke with patient and scheduled a Mychart Palliative Consult for 08/27/21 @ 12:30 PM.  Consent obtained; updated [facility], Team List and Epic.

## 2021-08-24 NOTE — Progress Notes (Signed)
[place] Cancer Follow up:   David Ruffini, MD [place] [address]   DIAGNOSIS:  Cancer Staging  Laryngeal cancer [facility]) Staging form: Larynx - Supraglottis, AJCC 8th Edition - Clinical stage from 12/01/2020: Stage IVB (cT3, cN3b, cM0) - Signed by [name], MD on 12/20/2020 Stage prefix: Initial diagnosis   SUMMARY OF ONCOLOGIC HISTORY: Oncology History  Laryngeal cancer ([place])  12/01/2020 Cancer Staging   Staging form: Larynx - Supraglottis, AJCC 8th Edition - Clinical stage from 12/01/2020: Stage IVB (cT3, cN3b, cM0) - Signed by [name], MD on 12/20/2020 Stage prefix: Initial diagnosis   12/04/2020 Initial Diagnosis   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 [facility])   12/25/2020 - 01/24/2021 Chemotherapy   Patient is on Treatment Plan : HEAD/NECK Cisplatin + XRT q21d       CURRENT THERAPY: s/p chemo and radiation  INTERVAL HISTORY:  David [name] 46 y.o. male returns for evaluation of his head neck cancer.   Patient was recently discharged after hospitalization when he was treated for fungal endocarditis, prescribed fluconazole 800 mg p.o. daily. While he was in the hospital he had a CT soft tissue of the neck and CT chest which showed findings suggestive of aspiration and associated chronic sequelae but at the same time he also had linear enhancement along the right side of the residual supraglottic and markedly irregular enhancement at the level of the false cords tissue concerning for residual or recurrent tumor.   Right level 2 and level 4 lymph nodes are stable to slightly increased in size.  He however refused to do biopsy according to Dr. Kandis Cocking message.  He is here for an appointment with his mom.  His mom tells me that he was in the hospital for almost 2 months and he nearly died and at one point was going to be made comfortable. He today continues on oral fluconazole and has some follow-up  appointment with ophthalmology given his Candida endocarditis.  He also has a follow-up made with ENT team on August 3.  He tells me that he feels well, denies any new complaints.  He was worried about swelling on his right wrist. 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   Dysgeusia 07/11/2021   S/P percutaneous endoscopic gastrostomy (PEG) tube placement ([place]) 06/24/2021   Pressure injury of skin 06/17/2021   Tracheostomy status ([place])    Heart failure with reduced ejection fraction ([place]) 04/28/2021   Elevated troponin 04/01/2021   Elevated transaminase level 04/01/2021   Weight loss, unintentional 01/15/2021   Protein-calorie malnutrition, severe 01/10/2021   AKI (acute kidney injury) ([place]) 01/08/2021   Chemotherapy induced nausea and vomiting 01/01/2021   Goals of care, counseling/discussion 12/13/2020   Laryngeal cancer ([place]) 12/04/2020   Teeth missing 12/04/2020   [place] 12/04/2020   Retained tooth root 12/04/2020   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  MEDICAL HISTORY: Past Medical History:  Diagnosis Date   Acute respiratory failure with hypoxia (HCC)    Aspiration pneumonia of right lower lobe (HCC) 06/06/2021   Bacterial endocarditis    Chronic HFrEF (heart failure with reduced ejection fraction) ([place]) 06/09/2021   Community acquired pneumonia due to  Pneumococcus ([place]) 04/01/2021   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[place])    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 ([place])    Pneumococcal bacteremia 06/09/2021   Pneumothorax    Left lung spontaneous pneumothorax at age 56 yr     SURGICAL HISTORY: Past Surgical History:  Procedure Laterality Date   chest tubes Left    IR GASTROSTOMY TUBE MOD SED   06/18/2021   LAPAROSCOPIC INSERTION GASTROSTOMY TUBE N/A 06/20/2021   Procedure: LAPAROSCOPIC INSERTION GASTROSTOMY TUBE;  Surgeon: Clovis Riley, MD;  Location: [facility];  Service: General;  Laterality: N/A;  DOW PATIENT   TRACHEOSTOMY TUBE PLACEMENT N/A 06/09/2021   Procedure: AWAKE TRACHEOSTOMY;  Surgeon: Melida Quitter, MD;  Location: [facility];  Service: ENT;  Laterality: N/A;    SOCIAL HISTORY: Social History   Socioeconomic History   Marital status: Divorced    Spouse name: Not on file   Number of children: Not on file   Years of education: Not on file   Highest education level: Not on file  Occupational History   Not on file  Tobacco Use   Smoking status: Every Day    Packs/day: 3.00    Types: Cigarettes   Smokeless tobacco: Never  Vaping Use   Vaping Use: Never used  Substance and Sexual Activity   Alcohol use: Not Currently    Alcohol/week: 12.0 standard drinks of alcohol    Types: 12 Cans of beer per week   Drug use: Not Currently    Types: IV    Comment: 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  Review of Systems  Constitutional:  Negative for appetite change, chills, fatigue, fever and unexpected weight change.  HENT:   Negative for hearing loss, lump/mass, sore throat and trouble swallowing.   Eyes:  Negative for eye problems and icterus.  Respiratory:  Negative for chest tightness, cough and shortness of breath.   Cardiovascular:  Negative for chest pain, leg swelling and palpitations.  Gastrointestinal:  Negative for abdominal  distention, abdominal pain, constipation, diarrhea, nausea and vomiting.  Endocrine: Negative for hot flashes.  Genitourinary:  Negative for difficulty urinating.   Musculoskeletal:  Negative for arthralgias.  Skin:  Negative for itching and rash.  Neurological:  Negative for dizziness, extremity weakness, headaches and numbness.  Hematological:  Negative for adenopathy. Does not bruise/bleed easily.  Psychiatric/Behavioral:  Negative for decreased concentration and depression. The patient is not nervous/anxious.       PHYSICAL EXAMINATION  ECOG PERFORMANCE STATUS: 1 - Symptomatic but completely ambulatory  Vitals:   08/24/21 1351  BP: 112/84  Pulse: 92  Resp: 16  Temp: 98.1 F (36.7 C)  SpO2: 100%    Physical Exam Constitutional:      General: He is not in acute distress.    Appearance: Normal appearance. He is not toxic-appearing.  HENT:     Head: Normocephalic and atraumatic.     Mouth/Throat:     Mouth: Mucous membranes are dry.  Eyes:  General: No scleral icterus. Neck:     Comments: Trach in place Cardiovascular:     Rate and Rhythm: Normal rate and regular rhythm.     Pulses: Normal pulses.     Heart sounds: Normal heart sounds.  Pulmonary:     Effort: Pulmonary effort is normal.     Breath sounds: Normal breath sounds.  Abdominal:     General: Abdomen is flat. Bowel sounds are normal.     Palpations: Abdomen is soft.  Musculoskeletal:        General: No swelling.     Cervical back: Neck supple.  Lymphadenopathy:     Cervical: Cervical adenopathy (There is still palpable swelling in the right upper cervical region however hard to distinguish if this is residual disease) present.  Skin:    General: Skin is warm and dry.  Neurological:     General: No focal deficit present.     Mental Status: He is alert.  Psychiatric:     Comments: Poor concentration    LABORATORY DATA:  CBC    Component Value Date/Time   WBC 7.9 08/16/2021 0540   RBC 3.75  (L) 08/16/2021 0540   HGB 11.0 (L) 08/16/2021 0540   HGB 14.0 03/30/2021 0856   HCT 33.0 (L) 08/16/2021 0540   PLT 186 08/16/2021 0540   PLT 308 03/30/2021 0856   MCV 88.0 08/16/2021 0540   MCV 100.9 (A) 07/27/2011 1302   MCH 29.3 08/16/2021 0540   MCHC 33.3 08/16/2021 0540   RDW 14.1 08/16/2021 0540   LYMPHSABS 0.1 (L) 08/10/2021 1025   MONOABS 0.2 08/10/2021 1025   EOSABS 0.1 08/10/2021 1025   BASOSABS 0.0 08/10/2021 1025    CMP     Component Value Date/Time   NA 133 (L) 08/16/2021 0540   K 4.4 08/16/2021 0540   CL 100 08/16/2021 0540   CO2 24 08/16/2021 0540   GLUCOSE 125 (H) 08/16/2021 0540   BUN 19 08/16/2021 0540   CREATININE 0.44 (L) 08/16/2021 0540   CREATININE 0.59 (L) 05/15/2021 1339   CALCIUM 9.3 08/16/2021 0540   PROT 6.8 08/13/2021 0301   ALBUMIN 2.6 (L) 08/13/2021 0301   AST 48 (H) 08/13/2021 0301   AST 88 (H) 03/30/2021 0856   ALT 49 (H) 08/13/2021 0301   ALT 39 03/30/2021 0856   ALKPHOS 93 08/13/2021 0301   BILITOT 0.5 08/13/2021 0301   BILITOT 0.6 03/30/2021 0856   GFRNONAA >60 08/16/2021 0540   GFRNONAA >60 05/15/2021 1339   GFRAA  01/25/2008 2040    >60        The eGFR has been calculated using the MDRD equation. This calculation has not been validated in all clinical    PENDING LABS: CBC reviewed, mild leukocytosis, otherwise no evidence of anemia or thrombocytopenia. CMP reviewed.   RADIOGRAPHIC STUDIES:  No results found.   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  He continues to have mucositis, but improving.  Physical examination with a palpable right cervical lymphadenopathy which is consistent with partial response so far His end of treatment PET did not show  any evidence of residual disease.  He was most recently admitted with laryngeal edema, respiratory failure, fungal endocarditis, now discharged on fluconazole daily.  He is here for follow-up.  His most recent imaging showed evidence of linear enhancement along the right side of the supraglottic area and irregular enhancement at the level of the vocal cord concerning for residual or recurrent tumor.  He apparently refused a biopsy of this area however he tells me that he has no memory of it.  He is scheduled to see Dr. Isaias Cowman in the outpatient clinic and he is willing to proceed with biopsies if needed.  I will coordinate with ENT team about his follow-up.  He is not thought to be a good candidate for salvage surgery on her recurrent radiation.  He has several underlying social issues and may not be a good candidate for aggressive chemotherapy but we can attempt treatment if his nutritional status improves.   All questions were answered. The patient knows to call the clinic with any problems, questions or concerns. We can certainly see the patient much sooner if necessary.  Total encounter time: 30 minutes in face-to-face visit time, chart review, lab review, care coordination, and documentation of the encounter*  *Total Encounter Time as defined by the Centers for Medicare and Medicaid Services includes, in addition to the face-to-face time of a patient visit (documented in the note above) non-face-to-face time: obtaining and reviewing outside history, ordering and reviewing medications, tests or procedures, care coordination (communications with other health care professionals or caregivers) and documentation in the medical record.

## 2021-08-24 NOTE — Progress Notes (Signed)
CSW submitted application for patient for [employer].  CSW will submit application for Medicaid transportation once the form is provided by DSS.  Madilyn Fireman, MSW, LCSW Transitions of Care  Clinical Social Worker II [phone]

## 2021-08-26 ENCOUNTER — Encounter: Payer: Self-pay | Admitting: Nurse Practitioner

## 2021-08-26 NOTE — Progress Notes (Signed)
[place]  [phone] [phone]   Name: David [name] Date: 08/26/2021 MRN: [id number]  DOB: 1975/09/13  Patient Care Team: Delene Ruffini, MD as PCP - General (Internal Medicine) [name], [name], RN as Oncology Nurse Navigator [name], [name], DO as Consulting Physician (Otolaryngology) [name], MD as Consulting Physician (Radiation Oncology) [name], MD as Consulting Physician (Hematology and Oncology)    REASON FOR CONSULTATION: David [name] is a 46 y.o. 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 He was recently hospitalized for several months at [facility] where he received treatment for fungal endocarditis and required emergent tracheostomy and PEG tube. During hospitalization CT of chest showed concerns for residual vs recurrent tumor. Palliative requested to re-engage for ongoing support and goals of care.    SOCIAL HISTORY:     reports that he has been smoking cigarettes. He has been smoking an average of 3 packs per day. He has never used smokeless tobacco. He reports that he does not currently use alcohol after a past usage of about 12.0 standard drinks of alcohol per week. He reports that he does not currently use drugs after having used the following drugs: IV.  ADVANCE DIRECTIVES:  Patient reports he does not have advanced directives.  Would like to further discuss in the future.  CODE STATUS: Full code  PAST MEDICAL HISTORY: Past Medical History:  Diagnosis Date   Acute respiratory failure with hypoxia (HCC)    Aspiration pneumonia of right lower lobe (HCC) 06/06/2021   Bacterial endocarditis    Chronic HFrEF (heart failure with reduced ejection  fraction) ([place]) 06/09/2021   Community acquired pneumonia due to Pneumococcus ([place]) 04/01/2021   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[place])    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 ([place])    Pneumococcal bacteremia 06/09/2021   Pneumothorax    Left lung spontaneous pneumothorax at age 19 yr     PAST SURGICAL HISTORY:  Past Surgical History:  Procedure Laterality Date   chest tubes Left    IR GASTROSTOMY TUBE MOD SED  06/18/2021   LAPAROSCOPIC INSERTION GASTROSTOMY TUBE N/A 06/20/2021   Procedure: LAPAROSCOPIC INSERTION GASTROSTOMY TUBE;  Surgeon: Clovis Riley, MD;  Location: [place];  Service: General;  Laterality: N/A;  DOW PATIENT   TRACHEOSTOMY TUBE PLACEMENT N/A 06/09/2021   Procedure: AWAKE TRACHEOSTOMY;  Surgeon: Melida Quitter, MD;  Location: [place];  Service: ENT;  Laterality: N/A;      ALLERGIES:  has No Known Allergies.  MEDICATIONS:  Current Outpatient Medications  Medication Sig Dispense Refill   chlorhexidine gluconate, MEDLINE KIT, (PERIDEX) 0.12 % solution Rinse  mouth with 15 mLs 2 (two) times daily. Do not eat or drink for 30 minutes. 473 mL 0   diclofenac Sodium (VOLTAREN) 1 % GEL Apply 2 g topically 4 (four) times daily as needed (pain). 100 g 2   DULoxetine (CYMBALTA) 60 MG capsule Take 1 capsule (60 mg total) by mouth daily. 90 capsule 2   fluconazole (DIFLUCAN) 200 MG tablet Take 4 tablets (800 mg total) by mouth daily. 357 tablet 0   folic acid (FOLVITE) 1 MG tablet Take 1 tablet (1 mg total) by mouth daily. 90 tablet 2   guaiFENesin (MUCINEX) 600 MG 12 hr tablet Take 2 tablets (1,200 mg total) by mouth 2 (two) times daily. (Patient  taking differently: Take 1,200 mg by mouth 2 (two) times daily as needed for cough or to loosen phlegm.) 60 tablet 1   hydrOXYzine (ATARAX) 25 MG tablet Place 4 tablets (100 mg total) into feeding tube 3 (three) times daily as needed for anxiety. 60 tablet 5   lidocaine (LIDODERM) 5 % Place 1 patch onto the skin daily. Remove & Discard patch  within 12 hours or as directed by MD 30 patch 2   lidocaine (XYLOCAINE) 2 % solution Patient: Mix 1part 2% viscous lidocaine, 1part H20. Swish/swallow 77m of diluted mixture, 321m before meals and _0 , up to QID prn soreness 200 mL 2   melatonin 3 MG TABS tablet Take 1 tablet (3 mg total) by mouth at bedtime as needed. 90 tablet 2   methadone (DOLOPHINE) 10 MG/ML solution Take 6 mLs (60 mg total) by mouth daily. (Patient taking differently: Take 50 mg by mouth daily.) 90 mL 0   Multiple Vitamin (MULTIVITAMIN WITH MINERALS) TABS tablet Place 1 tablet into feeding tube daily. 90 tablet 2   nicotine (NICODERM CQ - DOSED IN MG/24 HOURS) 14 mg/24hr patch Place 1 patch (14 mg total) onto the skin daily. Apply 21 mg patch daily x 6 wk, then 1452match daily x 2 wk, then 7 mg patch daily x 2 wk (Patient not taking: Reported on 06/06/2021) 14 patch 0   ondansetron (ZOFRAN) 8 MG tablet Take 1 tablet (8 mg total) by mouth every 8 (eight) hours as needed for nausea or vomiting. Starting day 3 after chemotherapy 30 tablet 2   pantoprazole (PROTONIX) 40 MG tablet Take 1 tablet (40 mg total) by mouth daily. 90 tablet 3   phenol (CHLORASEPTIC) 1.4 % LIQD Use as directed 1 spray in the mouth or throat as needed for throat irritation / pain. 177 mL 5   polyethylene glycol powder (GLYCOLAX/MIRALAX) 17 GM/SCOOP powder Place 17 g in water and drink daily. 238 g 3   sodium chloride 0.9 % nebulizer solution Take 3 mLs by nebulization every 8 (eight) hours as needed (Secretions). 90 mL 12   sodium chloride flush (NS) 0.9 % SOLN 10-40 mLs by Intracatheter route as needed (flush). 200 mL 5   thiamine (VITAMIN B1) 100 MG tablet Place 1 tablet (100 mg total) into feeding tube daily. 90 tablet 2   Zinc Oxide (TRIPLE PASTE) 12.8 % ointment Apply topically as needed for irritation. Apply to trach breakdown site 227 g 2   No current facility-administered medications for this visit.    VITAL SIGNS: There were no vitals  taken for this visit. There were no vitals filed for this visit.   Estimated body mass index is 13.03 kg/m as calculated from the following:   Height as of an earlier encounter on 08/24/21: _1  (1.803 m).   Weight as of an earlier encounter on 08/24/21: 93 lb 6.4 oz (42.4 kg). RADIOGRAPHIC STUDIES: CT CHEST W CONTRAST  Result Date: 08/18/2021 CLINICAL DATA:  Laryngeal cancer evaluate for metastatic disease in the chest, shortness of breath respiratory distress * Tracking Code: BO * EXAM: CT CHEST WITH CONTRAST TECHNIQUE: Multidetector CT imaging of the chest was performed during intravenous contrast administration. RADIATION DOSE REDUCTION: This exam was performed according to the departmental dose-optimization program which includes automated exposure control, adjustment of the mA and/or kV according to patient size and/or use of iterative reconstruction technique. CONTRAST:  53m51mNIPAQUE IOHEXOL 300 MG/ML  SOLN COMPARISON:  PET-CT, 05/14/2021 FINDINGS: Cardiovascular: Aortic atherosclerosis. Normal heart size.  No pericardial effusion. Mediastinum/Nodes: No enlarged mediastinal, hilar, or axillary lymph nodes. Thyroid gland, trachea, and esophagus demonstrate no significant findings. Lungs/Pleura: Tracheostomy. Status post wedge resection of the anterior left upper lobe. New, extensive, dependent bibasilar scarring bronchiectasis, most notably in the right lower lobe (series 4, image 97). Extensive, clustered centrilobular and tree-in-bud nodularity throughout the bilateral lung bases. Mild underlying paraseptal emphysema. Upper Abdomen: No acute abnormality.  Mild splenomegaly. Musculoskeletal: No chest wall abnormality. No suspicious osseous lesions identified. IMPRESSION: 1. New, extensive, dependent bibasilar scarring bronchiectasis, most notably in the right lower lobe. Extensive, clustered centrilobular and tree-in-bud nodularity throughout the bilateral lung bases. Findings are most consistent  with aspiration and associated chronic sequelae. 2. No evidence of lymphadenopathy or metastatic disease in the chest. 3. Status post wedge resection of the anterior left upper lobe. 4. Tracheostomy. 5. Emphysema. Aortic Atherosclerosis (ICD10-I70.0) and Emphysema (ICD10-J43.9). Electronically Signed   By: Delanna Ahmadi M.D.   On: 08/18/2021 16:54    PERFORMANCE STATUS (ECOG) : 1 - Symptomatic but completely ambulatory   Physical Exam General: NAD, cachectic Cardiovascular: regular rate and rhythm Pulmonary: clear ant fields, normal breathing pattern, tracheostomy midline, Passey muir valve  Abdomen: soft, nontender, + bowel sounds, PEG in place Extremities: no edema, no joint deformities Neurological: AAOx3, mood appropriate  IMPRESSION:  David [name] presents to the clinic today for follow-up post hospitalization. His mother, David [name] is also present. He was seen by [facility] on today.   No acute distress noted. Well groomed. Reports his appreciation of being out of the hospital and still alive. He was discharged to his own apartment which he has not had in several years. His mother continues to support him. Reports he is tolerating tube feedings and is waiting additional home supply. Denies nausea, vomiting, diarrhea, or constipation. He is also able to take in soft foods by mouth. Appetite is improved and he is trying to gain some of the weight he has loss back.   We discussed his recent illness and future plans. David [name] and his mother are clear in expressed goals to continue to treat the treatable. He is planning on proceeding with any suggested treatments and interventions such as biopsy. He is remaining hopeful and expresses appreciation of ongoing support to allow him every opportunity to thrive.   He is actively being followed at [facility] for his methadone treatments. Encouraged to continue with visits as this is working for him.     PLAN:  Ongoing goals of care discussions Patient and  mother clear in expressed wishes to continue to treat the treatable aggressively allowing him every opportunity to continue to thrive.  I will plan to see patient back in the clinic in 3-4 weeks in collaboration with his other oncology appointments.   Patient and mother expressed understanding and was in agreement with this plan. He also understands that He can call the clinic at any time with any questions, concerns, or complaints.   Time Total: 45 min   Visit consisted of counseling and education dealing with the complex and emotionally intense issues of symptom management and palliative care in the setting of serious and potentially life-threatening illness.Greater than 50%  of this time was spent counseling and coordinating care related to the above assessment and plan.  Alda Lea, AGPCNP-BC  Palliative Medicine Team/[place]

## 2021-08-27 ENCOUNTER — Telehealth: Payer: Medicaid Other | Admitting: Hospice

## 2021-08-28 ENCOUNTER — Telehealth: Payer: Self-pay

## 2021-08-28 NOTE — Telephone Encounter (Signed)
Spoke with patient regarding rescheduling Palliative Care consult that was scheduled for 7/31. Patient was rescheduled to 09/03/21.

## 2021-08-28 NOTE — Progress Notes (Unsigned)
Markus Daft, MD  Donita Brooks D ENT wants [name] to biopsy largest neck lymph node.  Schedule with [name].   [place]

## 2021-08-29 ENCOUNTER — Encounter: Payer: Medicaid Other | Admitting: Student

## 2021-09-03 ENCOUNTER — Telehealth: Payer: Medicaid Other | Admitting: Hospice

## 2021-09-07 ENCOUNTER — Telehealth: Payer: Medicaid Other | Admitting: Hospice

## 2021-09-07 DIAGNOSIS — G8929 Other chronic pain: Secondary | ICD-10-CM

## 2021-09-07 DIAGNOSIS — C329 Malignant neoplasm of larynx, unspecified: Secondary | ICD-10-CM

## 2021-09-07 DIAGNOSIS — Z515 Encounter for palliative care: Secondary | ICD-10-CM

## 2021-09-07 DIAGNOSIS — E43 Unspecified severe protein-calorie malnutrition: Secondary | ICD-10-CM

## 2021-09-07 NOTE — Progress Notes (Signed)
[facility] Palliative Care Consult Note Telephone: [phone]  Fax: [phone]  PATIENT NAME: David [name] [address] [place] [address] [phone] (home)  DOB: May 03, 1975 MRN: [id number]  PRIMARY CARE PROVIDER:    Delene Ruffini, MD,  [place] [address] [phone]  REFERRING PROVIDER:   Delene Ruffini, MD [address] [place],  [place] [address] [phone]  RESPONSIBLE PARTY:    Contact Information     Name Relation Home Work [place] Mother [phone]         TELEHEALTH VISIT STATEMENT Due to the COVID-19 crisis, this visit was done via telemedicine from my office and it was initiated and consent by this patient and or family.  I connected with patient OR PROXY by a telephone/video  and verified that I am speaking with the correct person. I discussed the limitations of evaluation and management by telemedicine. The patient expressed understanding and agreed to proceed. Palliative Care was asked to follow this patient to address advance care planning, complex medical decision making and goals of care clarification. This is the initial visit.     ASSESSMENT AND / RECOMMENDATIONS:   Advance Care Planning: Our advance care planning conversation included a discussion about:    The value and importance of advance care planning  Difference between Hospice and Palliative care Exploration of goals of care in the event of a sudden injury or illness  Identification and preparation of a healthcare agent  Review and updating or creation of an  advance directive document . Decision not to resuscitate or to de-escalate disease focused treatments due to poor prognosis.  CODE STATUS: Discussion on ramification and implications of code status. Patient elects to be Full code  Goals of Care: Goals include to maximize quality of life and symptom management  I  spent  16 minutes providing this initial consultation. More than 50% of the time in this consultation was spent on counseling patient and coordinating communication. --------------------------------------------------------------------------------------------------------------------------------------  Symptom Management/Plan: Laryngeal [id number] radiation and chemo. Scheduled for biopsy for 09/14/21. Presence of tracheostomy ,and use of Passey muir valve for talking. Follow up with Otolaryngologist and Oncologist as planned.  Dysphagia: related to Laryngeal CA. PEG tube for nourishment. Aspiration precautions. SLP consult as planned/needed.  Low back pain: Managed with Methadone by a pain clinic.  Constipation: Continue with Miralax as ordered. Routine CBC CMP Patient said he needed more of his tracheostomy supplies. He would call the supplies company; also gave him tel number of Dr [employer] office. Protein caloric malnutrition: Albumin 2.6 08/13/21. Weight 93 Ibs Height 5 feet 11 inches. ST consult as planned.  Follow up: Palliative care will continue to follow for complex medical decision making, advance care planning, and clarification of goals. Return 6 weeks or prn. Encouraged to call provider sooner with any concerns.   Family /Caregiver/Community Supports: Patient lives independently at home.   HOSPICE ELIGIBILITY/DIAGNOSIS: TBD  Chief Complaint: Initial Palliative care visit  HISTORY OF PRESENT ILLNESS:  David [name] is a 46 y.o. year old male  with multiple morbidities requiring close monitoring and with high risk of complications and  mortality: laryngeal CA, Dysphagia, tracheostomy status, low back pain, protein caloric malnutrition. History obtained from review of EMR, discussion with primary team, caregiver, family and/or David [name].  Review and summarization of Epic records shows history from other than patient. Rest of 10 point ROS asked and negative.  Independent  interpretation of tests and  reviewed as needed, available labs, patient records, imaging, studies and related documents from the EMR.  PAST MEDICAL HISTORY:  Active Ambulatory Problems    Diagnosis Date Noted   Laryngeal cancer ([place]) 12/04/2020   Teeth missing 12/04/2020   Caries 12/04/2020   Retained tooth root 12/04/2020   Chronic apical periodontitis 12/04/2020   Accretions on teeth 12/04/2020   Goals of care, counseling/discussion 12/13/2020   Chemotherapy induced nausea and vomiting 01/01/2021   AKI (acute kidney injury) ([place]) 01/08/2021   Protein-calorie malnutrition, severe 01/10/2021   Weight loss, unintentional 01/15/2021   Elevated troponin 04/01/2021   Elevated transaminase level 04/01/2021   Heart failure with reduced ejection fraction ([place]) 04/28/2021   Tracheostomy status ([place])    Pressure injury of skin 06/17/2021   S/P percutaneous endoscopic gastrostomy (PEG) tube placement ([place]) 06/24/2021   History of radiation to head and neck region 07/11/2021   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   Dysgeusia 07/11/2021   Candidal endocarditis    Back pain 08/16/2021   Resolved Ambulatory Problems    Diagnosis Date Noted   Encounter for preoperative dental examination 12/04/2020   Impacted third molar tooth 12/04/2020   Attrition, teeth excessive 12/04/2020   Chronic periodontitis 12/04/2020   Incipient enamel caries 12/04/2020   Hypotension 01/08/2021   Hyponatremia 01/08/2021   Leukopenia due to antineoplastic chemotherapy ([place]) 01/08/2021   SIRS (systemic inflammatory response syndrome) ([place]) 01/08/2021   Hypomagnesemia 01/09/2021   Hypokalemia 01/09/2021   Pancytopenia ([place]) 01/09/2021   Drug abuse and dependence ([place]) 01/15/2021   Mucositis due to antineoplastic therapy 02/14/2021   Community acquired pneumonia due to Pneumococcus ([place]) 04/01/2021   Odynophagia 04/28/2021   Anxiety state 04/28/2021   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    ALLERGIES: No Known Allergies    PERTINENT MEDICATIONS:  Outpatient Encounter Medications as of 09/07/2021  Medication Sig   chlorhexidine gluconate, MEDLINE KIT, (PERIDEX) 0.12 % solution Rinse  mouth with 15 mLs 2 (two) times daily. Do not eat or drink for 30 minutes.   diclofenac Sodium (VOLTAREN) 1 % GEL Apply 2 g topically 4 (four) times daily as needed (pain).   DULoxetine (CYMBALTA) 60 MG capsule Take 1 capsule (60 mg total) by mouth daily.   fluconazole (DIFLUCAN) 200 MG tablet Take 4 tablets (800 mg total) by mouth daily.   folic acid (FOLVITE) 1 MG tablet Take 1 tablet (1 mg total) by mouth daily.   guaiFENesin (MUCINEX) 600 MG 12 hr tablet Take 2 tablets (1,200 mg total) by mouth 2 (two) times daily. (Patient taking differently: Take 1,200 mg by mouth 2 (two) times daily as needed for  cough or to loosen phlegm.)   hydrOXYzine (ATARAX) 25 MG tablet Place 4 tablets (100 mg total) into feeding tube 3 (three) times daily as needed for anxiety.   lidocaine (LIDODERM) 5 % Place 1 patch onto the skin daily. Remove & Discard patch within 12 hours or as directed by MD   lidocaine (XYLOCAINE) 2 % solution Patient: Mix 1part 2% viscous lidocaine, 1part H20.  Swish/swallow 22m of diluted mixture, 317m before meals and @bedtime , up to QID prn soreness   melatonin 3 MG TABS tablet Take 1 tablet (3 mg total) by mouth at bedtime as needed.   methadone (DOLOPHINE) 10 MG/ML solution Take 6 mLs (60 mg total) by mouth daily. (Patient taking differently: Take 50 mg by mouth daily.)   Multiple Vitamin (MULTIVITAMIN WITH MINERALS) TABS tablet Place 1 tablet into feeding tube daily.   nicotine (NICODERM CQ - DOSED IN MG/24 HOURS) 14 mg/24hr patch Place 1 patch (14 mg total) onto the skin daily. Apply 21 mg patch daily x 6 wk, then 1472match daily x 2 wk, then 7 mg patch daily x 2 wk (Patient not taking: Reported on 06/06/2021)   ondansetron (ZOFRAN) 8 MG tablet Take 1 tablet (8 mg total) by mouth every 8 (eight) hours as needed for nausea or vomiting. Starting day 3 after chemotherapy   pantoprazole (PROTONIX) 40 MG tablet Take 1 tablet (40 mg total) by mouth daily.   phenol (CHLORASEPTIC) 1.4 % LIQD Use as directed 1 spray in the mouth or throat as needed for throat irritation / pain.   polyethylene glycol powder (GLYCOLAX/MIRALAX) 17 GM/SCOOP powder Place 17 g in water and drink daily.   sodium chloride 0.9 % nebulizer solution Take 3 mLs by nebulization every 8 (eight) hours as needed (Secretions).   sodium chloride flush (NS) 0.9 % SOLN 10-40 mLs by Intracatheter route as needed (flush).   thiamine (VITAMIN B1) 100 MG tablet Place 1 tablet (100 mg total) into feeding tube daily.   Zinc Oxide (TRIPLE PASTE) 12.8 % ointment Apply topically as needed for irritation. Apply to trach breakdown site   No facility-administered encounter medications on file as of 09/07/2021.     Thank you for the opportunity to participate in the care of Mr. CulSteinfeldtThe palliative care team will continue to follow. Please call our office at [phone] we can be of additional assistance.   Note: Portions of this note were generated with [employer]  errors may occur despite best attempts at proofreading.  LivTeodoro SprayP

## 2021-09-13 ENCOUNTER — Other Ambulatory Visit: Payer: Self-pay | Admitting: Radiology

## 2021-09-13 DIAGNOSIS — R591 Generalized enlarged lymph nodes: Secondary | ICD-10-CM

## 2021-09-14 ENCOUNTER — Other Ambulatory Visit: Payer: Self-pay

## 2021-09-14 ENCOUNTER — Telehealth: Payer: Self-pay | Admitting: *Deleted

## 2021-09-14 ENCOUNTER — Ambulatory Visit (HOSPITAL_COMMUNITY)
Admission: RE | Admit: 2021-09-14 | Discharge: 2021-09-14 | Disposition: A | Discharge status: Home / Self Care | Payer: Medicaid Other | Source: Ambulatory Visit | Attending: Hematology and Oncology | Admitting: Hematology and Oncology

## 2021-09-14 ENCOUNTER — Ambulatory Visit (INDEPENDENT_AMBULATORY_CARE_PROVIDER_SITE_OTHER): Payer: Medicaid Other | Admitting: Internal Medicine

## 2021-09-14 DIAGNOSIS — F1911 Other psychoactive substance abuse, in remission: Secondary | ICD-10-CM | POA: Diagnosis not present

## 2021-09-14 DIAGNOSIS — Z931 Gastrostomy status: Secondary | ICD-10-CM | POA: Insufficient documentation

## 2021-09-14 DIAGNOSIS — R591 Generalized enlarged lymph nodes: Secondary | ICD-10-CM

## 2021-09-14 DIAGNOSIS — R221 Localized swelling, mass and lump, neck: Secondary | ICD-10-CM | POA: Diagnosis not present

## 2021-09-14 DIAGNOSIS — Z8521 Personal history of malignant neoplasm of larynx: Secondary | ICD-10-CM | POA: Diagnosis not present

## 2021-09-14 DIAGNOSIS — Z7189 Other specified counseling: Secondary | ICD-10-CM

## 2021-09-14 DIAGNOSIS — C329 Malignant neoplasm of larynx, unspecified: Secondary | ICD-10-CM

## 2021-09-14 DIAGNOSIS — Z930 Tracheostomy status: Secondary | ICD-10-CM | POA: Diagnosis not present

## 2021-09-14 LAB — CBC
HCT: 36.9 % — ABNORMAL LOW (ref 39.0–52.0)
Hemoglobin: 12 g/dL — ABNORMAL LOW (ref 13.0–17.0)
MCH: 29.1 pg (ref 26.0–34.0)
MCHC: 32.5 g/dL (ref 30.0–36.0)
MCV: 89.3 fL (ref 80.0–100.0)
Platelets: 334 10*3/uL (ref 150–400)
RBC: 4.13 MIL/uL — ABNORMAL LOW (ref 4.22–5.81)
RDW: 14.4 % (ref 11.5–15.5)
WBC: 10.5 10*3/uL (ref 4.0–10.5)
nRBC: 0 % (ref 0.0–0.2)

## 2021-09-14 LAB — PROTIME-INR
INR: 1.1 (ref 0.8–1.2)
Prothrombin Time: 13.8 seconds (ref 11.4–15.2)

## 2021-09-14 MED ORDER — MIDAZOLAM HCL 2 MG/2ML IJ SOLN
INTRAMUSCULAR | Status: AC
  Filled 2021-09-14: qty 2

## 2021-09-14 MED ORDER — FENTANYL CITRATE (PF) 100 MCG/2ML IJ SOLN
INTRAMUSCULAR | Status: AC
  Filled 2021-09-14: qty 2

## 2021-09-14 MED ORDER — MIDAZOLAM HCL 2 MG/2ML IJ SOLN
INTRAMUSCULAR | Status: AC | PRN
  Administered 2021-09-14 (×2): .5 mg via INTRAVENOUS

## 2021-09-14 MED ORDER — SODIUM CHLORIDE 0.9 % IV SOLN: INTRAVENOUS | Status: DC

## 2021-09-14 MED ORDER — LIDOCAINE HCL (PF) 1 % IJ SOLN
INTRAMUSCULAR | Status: AC
  Filled 2021-09-14: qty 30

## 2021-09-14 MED ORDER — FENTANYL CITRATE (PF) 100 MCG/2ML IJ SOLN
INTRAMUSCULAR | Status: AC | PRN
  Administered 2021-09-14 (×2): 25 ug via INTRAVENOUS

## 2021-09-14 NOTE — H&P (Signed)
Chief Complaint: Enlarging right neck lymph nodes. Request is for right neck lymph node biopsy  Referring Physician(s): Iruku,Praveena  Supervising Physician: Michaelle Birks  Patient Status: [facility] - Out-pt  History of Present Illness: David [name] is a 46 y.o. male 46 y.o. male outpatient. History of substance abuse, homelessness, right supraglottic squamous cell carcinoma. 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 Found to have enlarging lymph nodes during workup while inpatient for fever. CT soft tissue neck from 7.14.23 reads  Lymph nodes: Peripherally enhancing consolidated nodes the right level 2 station stable to slightly increased in size, now measuring 3.4 x 2.5 x 1.8 cm. A peripherally enhancing right lateral pharyngeal lymph node has decreased in size, now measuring 11 x 9 x 17 mm. An 8 mm right level 4 lymph node is stable. Case approved on [phone] by IR Attending Dr. Annamaria Boots while inpatient.  And on 8.1.12 with Dr. Anselm Pancoast. Patient presents for lymph node biopsy. Patient is now palliative care.   Currently without any significant complaints. Patient alert and laying in bed, calm. Denies any fevers, headache, chest pain, SOB, cough, abdominal pain, nausea, vomiting or bleeding. Return precautions and treatment recommendations and follow-up discussed with the patient  who is agreeable with the plan.   Past Medical History:  Diagnosis Date   Acute respiratory failure with hypoxia (HCC)    Aspiration pneumonia of right lower lobe (HCC) 06/06/2021   Bacterial endocarditis    Chronic HFrEF (heart failure with reduced ejection fraction) ([place]) 06/09/2021   Community acquired pneumonia due to Pneumococcus ([place]) 04/01/2021   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[place])    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 ([place])    Pneumococcal bacteremia 06/09/2021   Pneumothorax    Left lung spontaneous  pneumothorax at age 51 yr     Past Surgical History:  Procedure Laterality Date   chest tubes Left    IR GASTROSTOMY TUBE MOD SED  06/18/2021   LAPAROSCOPIC INSERTION GASTROSTOMY TUBE N/A 06/20/2021   Procedure: LAPAROSCOPIC INSERTION GASTROSTOMY TUBE;  Surgeon: Clovis Riley, MD;  Location: [place];  Service: General;  Laterality: N/A;  DOW PATIENT   TRACHEOSTOMY TUBE PLACEMENT N/A 06/09/2021   Procedure: AWAKE TRACHEOSTOMY;  Surgeon: Melida Quitter, MD;  Location: [place];  Service: ENT;  Laterality: N/A;    Allergies: Patient has no known allergies.  Medications: Prior to Admission medications   Medication Sig Start Date End Date Taking? Authorizing Provider  fluconazole (DIFLUCAN) 200 MG tablet Take 4 tablets (800 mg total) by mouth daily. 08/22/21 09/21/21 Yes Lacinda Axon, MD  guaiFENesin (MUCINEX) 600 MG 12 hr tablet Take 2 tablets (1,200 mg total) by mouth 2 (two) times daily. Patient taking differently: Take 1,200 mg by mouth 2 (two) times daily as needed for cough or to loosen phlegm. 02/26/21  Yes Pickenpack-Cousar, Carlena Sax, NP  hydrOXYzine (ATARAX) 25 MG tablet Place 4 tablets (100 mg total) into feeding tube 3 (three) times daily as needed for anxiety. 08/22/21  Yes Lacinda Axon, MD  methadone (DOLOPHINE) 10 MG/ML solution Take 6 mLs (60 mg total) by mouth daily. Patient taking differently: Take 50 mg by mouth daily. 01/08/21  Yes Pickenpack-Cousar, Carlena Sax, NP  chlorhexidine gluconate, MEDLINE KIT, (PERIDEX) 0.12 % solution Rinse  mouth with 15 mLs 2 (two) times daily. Do not eat or drink for 30 minutes. 08/22/21   Lacinda Axon, MD  diclofenac Sodium (VOLTAREN) 1 %  GEL Apply 2 g topically 4 (four) times daily as needed (pain). 08/22/21   Lacinda Axon, MD  DULoxetine (CYMBALTA) 60 MG capsule Take 1 capsule (60 mg total) by mouth daily. 08/22/21   Lacinda Axon, MD  folic acid (FOLVITE) 1 MG tablet Take 1 tablet (1 mg total) by mouth daily. 08/22/21    Lacinda Axon, MD  lidocaine (LIDODERM) 5 % Place 1 patch onto the skin daily. Remove & Discard patch within 12 hours or as directed by MD 08/22/21   Lacinda Axon, MD  lidocaine (XYLOCAINE) 2 % solution Patient: Mix 1part 2% viscous lidocaine, 1part H20. Swish/swallow 61m of diluted mixture, 316m before meals and _0 , up to QID prn soreness 02/26/21   SqEppie GibsonMD  melatonin 3 MG TABS tablet Take 1 tablet (3 mg total) by mouth at bedtime as needed. 08/22/21   AmLacinda AxonMD  Multiple Vitamin (MULTIVITAMIN WITH MINERALS) TABS tablet Place 1 tablet into feeding tube daily. 08/22/21   AmLacinda AxonMD  nicotine (NICODERM CQ - DOSED IN MG/24 HOURS) 14 mg/24hr patch Place 1 patch (14 mg total) onto the skin daily. Apply 21 mg patch daily x 6 wk, then 1466match daily x 2 wk, then 7 mg patch daily x 2 wk Patient not taking: Reported on 06/06/2021 02/26/21   SquEppie GibsonD  ondansetron (ZOFRAN) 8 MG tablet Take 1 tablet (8 mg total) by mouth every 8 (eight) hours as needed for nausea or vomiting. Starting day 3 after chemotherapy 05/17/21   IruBenay PikeD  pantoprazole (PROTONIX) 40 MG tablet Take 1 tablet (40 mg total) by mouth daily. 08/22/21 08/22/22  AmpLacinda AxonD  phenol (CHLORASEPTIC) 1.4 % LIQD Use as directed 1 spray in the mouth or throat as needed for throat irritation / pain. 08/22/21   AmpLacinda AxonD  polyethylene glycol powder (GLYCOLAX/MIRALAX) 17 GM/SCOOP powder Place 17 g in water and drink daily. 08/22/21   AmpLacinda AxonD  sodium chloride 0.9 % nebulizer solution Take 3 mLs by nebulization every 8 (eight) hours as needed (Secretions). 08/22/21   AmpLacinda AxonD  sodium chloride flush (NS) 0.9 % SOLN 10-40 mLs by Intracatheter route as needed (flush). 08/22/21   AmpLacinda AxonD  thiamine (VITAMIN B1) 100 MG tablet Place 1 tablet (100 mg total) into feeding tube daily. 08/22/21   AmpLacinda AxonD  Zinc Oxide  (TRIPLE PASTE) 12.8 % ointment Apply topically as needed for irritation. Apply to trach breakdown site 08/22/21   AmpLacinda AxonD     No family history on file.  Social History   Socioeconomic History   Marital status: Divorced    Spouse name: Not on file   Number of children: Not on file   Years of education: Not on file   Highest education level: Not on file  Occupational History   Not on file  Tobacco Use   Smoking status: Every Day    Packs/day: 3.00    Types: Cigarettes   Smokeless tobacco: Never  Vaping Use   Vaping Use: Never used  Substance and Sexual Activity   Alcohol use: Not Currently    Alcohol/week: 12.0 standard drinks of alcohol    Types: 12 Cans of beer per week   Drug use: Not Currently    Types: IV    Comment: 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)   Sexual activity: Not on file  Other Topics Concern   Not on file  Social History Narrative   Not on file   Social Determinants of Health   Financial Resource Strain: Not on file  Food Insecurity: Not on file  Transportation Needs: Not on file  Physical Activity: Not on file  Stress: Not on file  Social Connections: Not on file     Review of Systems: A 12 point ROS discussed and pertinent positives are indicated in the HPI above.  All other systems are negative.  Review of Systems  Constitutional:  Negative for fever.  HENT:  Negative for congestion.   Respiratory:  Negative for cough and shortness of breath.   Cardiovascular:  Negative for chest pain.  Gastrointestinal:  Negative for abdominal pain.  Neurological:  Negative for headaches.  Psychiatric/Behavioral:  Negative for behavioral problems and confusion.     Vital Signs: BP 102/79   Pulse 70   Temp 97.8 F (36.6 C) (Temporal)   Resp 16   Ht _0  (1.803 m)   Wt 85 lb (38.6 kg)   SpO2 98%   BMI 11.86 kg/m     Physical Exam Vitals and nursing note reviewed.  Constitutional:      Appearance: He is  well-developed. He is ill-appearing.  HENT:     Head: Normocephalic.  Cardiovascular:     Rate and Rhythm: Normal rate and regular rhythm.     Heart sounds: Normal heart sounds.  Pulmonary:     Effort: Pulmonary effort is normal.     Breath sounds: Normal breath sounds.     Comments: Lurline Idol present Musculoskeletal:        General: Normal range of motion.     Cervical back: Normal range of motion.  Skin:    General: Skin is dry.  Neurological:     General: No focal deficit present.     Mental Status: He is alert and oriented to person, place, and time. Mental status is at baseline.     Imaging: CT CHEST W CONTRAST  Result Date: 08/18/2021 CLINICAL DATA:  Laryngeal cancer evaluate for metastatic disease in the chest, shortness of breath respiratory distress * Tracking Code: BO * EXAM: CT CHEST WITH CONTRAST TECHNIQUE: Multidetector CT imaging of the chest was performed during intravenous contrast administration. RADIATION DOSE REDUCTION: This exam was performed according to the departmental dose-optimization program which includes automated exposure control, adjustment of the mA and/or kV according to patient size and/or use of iterative reconstruction technique. CONTRAST:  23m OMNIPAQUE IOHEXOL 300 MG/ML  SOLN COMPARISON:  PET-CT, 05/14/2021 FINDINGS: Cardiovascular: Aortic atherosclerosis. Normal heart size. No pericardial effusion. Mediastinum/Nodes: No enlarged mediastinal, hilar, or axillary lymph nodes. Thyroid gland, trachea, and esophagus demonstrate no significant findings. Lungs/Pleura: Tracheostomy. Status post wedge resection of the anterior left upper lobe. New, extensive, dependent bibasilar scarring bronchiectasis, most notably in the right lower lobe (series 4, image 97). Extensive, clustered centrilobular and tree-in-bud nodularity throughout the bilateral lung bases. Mild underlying paraseptal emphysema. Upper Abdomen: No acute abnormality.  Mild splenomegaly. Musculoskeletal:  No chest wall abnormality. No suspicious osseous lesions identified. IMPRESSION: 1. New, extensive, dependent bibasilar scarring bronchiectasis, most notably in the right lower lobe. Extensive, clustered centrilobular and tree-in-bud nodularity throughout the bilateral lung bases. Findings are most consistent with aspiration and associated chronic sequelae. 2. No evidence of lymphadenopathy or metastatic disease in the chest. 3. Status post wedge resection of the anterior left upper lobe. 4. Tracheostomy. 5. Emphysema. Aortic Atherosclerosis (ICD10-I70.0) and Emphysema (ICD10-J43.9). Electronically Signed  By: Delanna Ahmadi M.D.   On: 08/18/2021 16:54    Labs:  CBC: Recent Labs    07/26/21 0750 08/10/21 1025 08/13/21 0301 08/16/21 0540  WBC 8.6 5.9 7.1 7.9  HGB 11.9* 10.4* 9.9* 11.0*  HCT 36.3* 32.2* 30.3* 33.0*  PLT 273 104* 136* 186    COAGS: Recent Labs    12/08/20 1111 03/31/21 2104  INR 1.1 1.2  APTT  --  33    BMP: Recent Labs    07/24/21 1208 08/10/21 1025 08/13/21 0301 08/16/21 0540  NA 134* 134* 132* 133*  K 4.4 4.9 4.8 4.4  CL 96* 100 95* 100  CO2 _0 GLUCOSE 117* 109* 107* 125*  BUN 21* 31* 29* 19  CALCIUM 9.4 9.3 9.1 9.3  CREATININE 0.57* 0.63 0.58* 0.44*  GFRNONAA >60 >60 >60 >60    LIVER FUNCTION TESTS: Recent Labs    06/06/21 1010 06/08/21 0418 08/10/21 1025 08/13/21 0301  BILITOT 1.0 0.4 0.6 0.5  AST 50* 42* 88* 48*  ALT 22 21 68* 49*  ALKPHOS 63 50 146* 93  PROT 7.2 4.9* 7.5 6.8  ALBUMIN 2.3* <1.5* 2.8* 2.6*     Assessment and Plan:  46 y.o. male outpatient. History of substance abuse, homelessness, right supraglottic squamous cell carcinoma. Recent admitted for several months for fungal endocarditis, s/p emergent tracheostomy on 5.12.23, surgically placed gastrostomy tube on 5.24.23. Found to have enlarging lymph nodes during workup while inpatient for fever. CT soft tissue neck from 7.14.23 reads  Lymph nodes: Peripherally  enhancing consolidated nodes the right level 2 station stable to slightly increased in size, now measuring 3.4 x 2.5 x 1.8 cm. A peripherally enhancing right lateral pharyngeal lymph node has decreased in size, now measuring 11 x 9 x 17 mm. An 8 mm right level 4 lymph node is stable. Case approved on [phone] by IR Attending Dr. Annamaria Boots while inpatient.  And on 8.1.12 with Dr. Anselm Pancoast. Patient presents for  right neck lymph node biopsy. Patient is now palliative care.   Labs from 7.20.23 and all medications are within acceptable parameters NKDA. Patient has been NPO since midnight.   Risks and benefits of right neck lymph node biopsy was discussed with the patient and/or patient's family including, but not limited to bleeding, infection, damage to adjacent structures or low yield requiring additional tests.  All of the questions were answered and there is agreement to proceed.  Consent signed and in chart.   Thank you for this interesting consult.  I greatly enjoyed meeting David [name] and look forward to participating in their care.  A copy of this report was sent to the requesting provider on this date.  Electronically Signed: Jacqualine Mau, NP 09/14/2021, 11:15 AM   I spent a total of  30 Minutes   in face to face in clinical consultation, greater than 50% of which was counseling/coordinating care for enlarging right neck lymph node biopsy

## 2021-09-14 NOTE — Procedures (Signed)
Interventional Radiology Procedure Note  Procedure: Ultrasound guided right neck mass biopsy  Findings: Please refer to procedural dictation for full description.18 ga core x5 of right neck mass.    Complications: None immediate  Estimated Blood Loss: < 5 mL  Recommendations: Follow up Pathology results.   Ruthann Cancer, MD

## 2021-09-14 NOTE — Progress Notes (Signed)
Attempt to reach patient for telephone visit 3 times from 14:50-15:30 were unsuccessful. There was no answer and there was no voicemail set up to leave a message.   David [name] was under my care during his recent prolong hospitalization and during that stay, on multiple occasions, he stated that he did not wish for his mother to be contacted and updated regarding his care as he wished to be the person relaying information to her. For this reason I did not call other contact numbers listed in his chart.  Our team would be happy to care for David [name] either via telephone visit or in-person visit. He was scheduled earlier this month for an in-person visit but unfortunately was not seen at that time. Please schedule him to be seen or called during the next available slot by someone in the [facility] if he reaches back out.  Farrel Gordon, DO Internal Medicine PGY-2

## 2021-09-14 NOTE — Telephone Encounter (Signed)
Patient's mother walked in asking to speak with doctor for concerns on how poorly patient is feeling. Patient is currently in Radiology for U/S and bx. Discussed with today's Attending and patient given tele appt today at 1500. Mom is very [employer] attorney.

## 2021-09-15 ENCOUNTER — Encounter (HOSPITAL_COMMUNITY): Payer: Self-pay | Admitting: Emergency Medicine

## 2021-09-15 ENCOUNTER — Emergency Department (HOSPITAL_COMMUNITY)
Admission: EM | Admit: 2021-09-15 | Discharge: 2021-09-15 | Disposition: A | Discharge status: Home / Self Care | Payer: Medicaid Other | Attending: Emergency Medicine | Admitting: Emergency Medicine

## 2021-09-15 DIAGNOSIS — J9503 Malfunction of tracheostomy stoma: Secondary | ICD-10-CM | POA: Diagnosis present

## 2021-09-15 DIAGNOSIS — Z8521 Personal history of malignant neoplasm of larynx: Secondary | ICD-10-CM | POA: Diagnosis not present

## 2021-09-15 DIAGNOSIS — I509 Heart failure, unspecified: Secondary | ICD-10-CM | POA: Insufficient documentation

## 2021-09-15 DIAGNOSIS — Z9889 Other specified postprocedural states: Secondary | ICD-10-CM

## 2021-09-15 NOTE — ED Notes (Signed)
EDP and RT at [facility]. Pt alert, NAD, calm, interactive, resps e/u. Lurline Idol out, stoma clogged. Speech clear.

## 2021-09-15 NOTE — ED Triage Notes (Signed)
Patient here with complaint of needing help with this trach. Patient states he lost the inner cannula for approximately one week and then when a new one was delivered it no longer fit. Patient is alert, oriented, and in no apparent distress at this time. Patient states the size he had delivered was a 4.

## 2021-09-15 NOTE — Progress Notes (Signed)
Patient came in with a #4 cuffless Shiley that has not been changed in a while per patient, trach appeared soiled on the outside and occluded on the inside, MD at the bedside and ordered the trach be changed, trach changed to a #4 cuffless Shiley, good color change on ETCO2 detector, SATS 100% on room air, Patient has a PMV which he puts on by himself.

## 2021-09-15 NOTE — ED Notes (Signed)
EDP into see 

## 2021-09-15 NOTE — Discharge Instructions (Signed)
Follow-up with your ENT doctor.  His number is below.  Return to the emergency department at any time for any further issues.

## 2021-09-15 NOTE — ED Notes (Signed)
Trach replaced, RT finished at [facility]. Pt verbalizes "feeling better, ready to go". Encouraged pt to wait for formal d/c. EDP notified.

## 2021-09-15 NOTE — ED Provider Notes (Signed)
[facility] EMERGENCY DEPARTMENT Provider Note   CSN: [id number] Arrival date & time: 09/15/21  [id number]     History {Add pertinent medical, surgical, social history, OB history to HPI:1} No chief complaint on file.   David [name] is a 46 y.o. male.  HPI Patient presents for***.  Medical history includes laryngeal cancer, CHF, tracheostomy, PEG tube.    Home Medications Prior to Admission medications   Medication Sig Start Date End Date Taking? Authorizing Provider  chlorhexidine gluconate, MEDLINE KIT, (PERIDEX) 0.12 % solution Rinse  mouth with 15 mLs 2 (two) times daily. Do not eat or drink for 30 minutes. 08/22/21   Lacinda Axon, MD  diclofenac Sodium (VOLTAREN) 1 % GEL Apply 2 g topically 4 (four) times daily as needed (pain). 08/22/21   Lacinda Axon, MD  DULoxetine (CYMBALTA) 60 MG capsule Take 1 capsule (60 mg total) by mouth daily. 08/22/21   Lacinda Axon, MD  fluconazole (DIFLUCAN) 200 MG tablet Take 4 tablets (800 mg total) by mouth daily. 08/22/21 09/21/21  Lacinda Axon, MD  folic acid (FOLVITE) 1 MG tablet Take 1 tablet (1 mg total) by mouth daily. 08/22/21   Lacinda Axon, MD  guaiFENesin (MUCINEX) 600 MG 12 hr tablet Take 2 tablets (1,200 mg total) by mouth 2 (two) times daily. Patient taking differently: Take 1,200 mg by mouth 2 (two) times daily as needed for cough or to loosen phlegm. 02/26/21   Pickenpack-Cousar, Carlena Sax, NP  hydrOXYzine (ATARAX) 25 MG tablet Place 4 tablets (100 mg total) into feeding tube 3 (three) times daily as needed for anxiety. 08/22/21   Lacinda Axon, MD  lidocaine (LIDODERM) 5 % Place 1 patch onto the skin daily. Remove & Discard patch within 12 hours or as directed by MD 08/22/21   Lacinda Axon, MD  lidocaine (XYLOCAINE) 2 % solution Patient: Mix 1part 2% viscous lidocaine, 1part H20. Swish/swallow 46m of diluted mixture, 354m before meals and _0 , up to QID prn soreness  02/26/21   SqEppie GibsonMD  melatonin 3 MG TABS tablet Take 1 tablet (3 mg total) by mouth at bedtime as needed. 08/22/21   AmLacinda AxonMD  methadone (DOLOPHINE) 10 MG/ML solution Take 6 mLs (60 mg total) by mouth daily. Patient taking differently: Take 50 mg by mouth daily. 01/08/21   Pickenpack-Cousar, AtCarlena SaxNP  Multiple Vitamin (MULTIVITAMIN WITH MINERALS) TABS tablet Place 1 tablet into feeding tube daily. 08/22/21   AmLacinda AxonMD  nicotine (NICODERM CQ - DOSED IN MG/24 HOURS) 14 mg/24hr patch Place 1 patch (14 mg total) onto the skin daily. Apply 21 mg patch daily x 6 wk, then 1489match daily x 2 wk, then 7 mg patch daily x 2 wk Patient not taking: Reported on 06/06/2021 02/26/21   SquEppie GibsonD  ondansetron (ZOFRAN) 8 MG tablet Take 1 tablet (8 mg total) by mouth every 8 (eight) hours as needed for nausea or vomiting. Starting day 3 after chemotherapy 05/17/21   IruBenay PikeD  pantoprazole (PROTONIX) 40 MG tablet Take 1 tablet (40 mg total) by mouth daily. 08/22/21 08/22/22  AmpLacinda AxonD  phenol (CHLORASEPTIC) 1.4 % LIQD Use as directed 1 spray in the mouth or throat as needed for throat irritation / pain. 08/22/21   AmpLacinda AxonD  polyethylene glycol powder (GLYCOLAX/MIRALAX) 17 GM/SCOOP powder Place 17 g in water and drink daily. 08/22/21   AmpLacinda AxonD  sodium  chloride 0.9 % nebulizer solution Take 3 mLs by nebulization every 8 (eight) hours as needed (Secretions). 08/22/21   Lacinda Axon, MD  sodium chloride flush (NS) 0.9 % SOLN 10-40 mLs by Intracatheter route as needed (flush). 08/22/21   Lacinda Axon, MD  thiamine (VITAMIN B1) 100 MG tablet Place 1 tablet (100 mg total) into feeding tube daily. 08/22/21   Lacinda Axon, MD  Zinc Oxide (TRIPLE PASTE) 12.8 % ointment Apply topically as needed for irritation. Apply to trach breakdown site 08/22/21   Lacinda Axon, MD      Allergies    Patient has no known  allergies.    Review of Systems   Review of Systems  Physical Exam Updated Vital Signs BP 105/81   Pulse 79   Temp 97.7 F (36.5 C) (Oral)   Resp 18   SpO2 100%  Physical Exam  ED Results / Procedures / Treatments   Labs (all labs ordered are listed, but only abnormal results are displayed) Labs Reviewed - No data to display  EKG None  Radiology [name] CORE BIOPSY (LYMPH NODES)  Result Date: 09/14/2021 INDICATION: 46 year old male with history of laryngeal cancer presenting with right neck mass of indeterminate etiology. EXAM: Ultrasound-guided right neck mass biopsy MEDICATIONS: None. ANESTHESIA/SEDATION: Moderate (conscious) sedation was employed during this procedure. A total of Versed 1 mg and Fentanyl 50 mcg was administered intravenously. Moderate Sedation Time: 14 minutes. The patient's level of consciousness and vital signs were monitored continuously by radiology nursing throughout the procedure under my direct supervision. FLUOROSCOPY TIME:  None. COMPLICATIONS: None immediate. PROCEDURE: Informed written consent was obtained from the patient after a thorough discussion of the procedural risks, benefits and alternatives. All questions were addressed. Maximal Sterile Barrier Technique was utilized including caps, mask, sterile gowns, sterile gloves, sterile drape, hand hygiene and skin antiseptic. A timeout was performed prior to the initiation of the procedure. Preprocedure ultrasound evaluation of the right neck demonstrated right cervical level 3 ovoid heterogeneously hypoechoic mass with peripheral calcification. The procedure was planned. The right neck was prepped and draped in standard fashion. Subdermal Local anesthesia was administered at the planned needle entry site. Deeper local anesthetic was administered under ultrasound guidance to the periphery of the targeted mass. A small skin nick was made. Under direct ultrasound visualization, a 17 gauge coaxial introducer needle  was introduced in the periphery of the mass. Next, a total of 5, 18 gauge core biopsies were obtained and placed in formalin. The needle was removed. Postprocedure ultrasound demonstrated no evidence of surrounding hematoma or other complicating features. Hemostasis was achieved with brief manual compression. A sterile bandage was applied. The patient tolerated the procedure well was transferred to the recovery area in good condition. IMPRESSION: Technically successful ultrasound-guided right cervical mass biopsy. Ruthann Cancer, MD [facility] Rad[facility] Electronically Signed   By: Ruthann Cancer M.D.   On: 09/14/2021 15:41    Procedures Procedures  {Document cardiac monitor, telemetry assessment procedure when appropriate:1}  Medications Ordered in ED Medications - No data to display  ED Course/ Medical Decision Making/ A&P                           Medical Decision Making  ***  {Document critical care time when appropriate:1} {Document review of labs and clinical decision tools ie heart score, Chads2Vasc2 etc:1}  {Document your independent review of radiology images, and any outside records:1} {Document  your discussion with family members, caretakers, and with consultants:1} {Document social determinants of health affecting pt's care:1} {Document your decision making why or why not admission, treatments were needed:1} Final Clinical Impression(s) / ED Diagnoses Final diagnoses:  None    Rx / DC Orders ED Discharge Orders     None

## 2021-09-17 LAB — SURGICAL PATHOLOGY

## 2021-09-17 NOTE — Progress Notes (Signed)
Internal Medicine Clinic Attending  Case discussed with Dr. Marlou Sa  At the time of the visit.

## 2021-09-25 ENCOUNTER — Encounter: Payer: Self-pay | Admitting: Hematology and Oncology

## 2021-09-25 ENCOUNTER — Inpatient Hospital Stay (HOSPITAL_BASED_OUTPATIENT_CLINIC_OR_DEPARTMENT_OTHER): Payer: Medicaid Other | Admitting: Nurse Practitioner

## 2021-09-25 ENCOUNTER — Inpatient Hospital Stay: Payer: Medicaid Other | Attending: Radiation Oncology | Admitting: Hematology and Oncology

## 2021-09-25 ENCOUNTER — Other Ambulatory Visit: Payer: Self-pay

## 2021-09-25 DIAGNOSIS — Z923 Personal history of irradiation: Secondary | ICD-10-CM | POA: Insufficient documentation

## 2021-09-25 DIAGNOSIS — Z9221 Personal history of antineoplastic chemotherapy: Secondary | ICD-10-CM | POA: Insufficient documentation

## 2021-09-25 DIAGNOSIS — Z7189 Other specified counseling: Secondary | ICD-10-CM

## 2021-09-25 DIAGNOSIS — Z930 Tracheostomy status: Secondary | ICD-10-CM | POA: Diagnosis not present

## 2021-09-25 DIAGNOSIS — F419 Anxiety disorder, unspecified: Secondary | ICD-10-CM

## 2021-09-25 DIAGNOSIS — C329 Malignant neoplasm of larynx, unspecified: Secondary | ICD-10-CM | POA: Diagnosis not present

## 2021-09-25 DIAGNOSIS — C328 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: Secondary | ICD-10-CM | POA: Diagnosis present

## 2021-09-25 DIAGNOSIS — G893 Neoplasm related pain (acute) (chronic): Secondary | ICD-10-CM

## 2021-09-25 DIAGNOSIS — Z515 Encounter for palliative care: Secondary | ICD-10-CM

## 2021-09-25 NOTE — Assessment & Plan Note (Signed)
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  He continues to have mucositis, but improving.  Physical examination with a palpable right cervical lymphadenopathy which is consistent with partial response so far His end of treatment PET did not show any evidence of residual disease.  He was most recently admitted with laryngeal edema, respiratory failure, fungal endocarditis, now discharged on fluconazole daily.  He is here for follow-up.  His most recent imaging showed evidence of linear enhancement along the right side of the supraglottic area and irregular enhancement at the level of the vocal cord concerning for residual or recurrent tumor.  Since his last visit he had a biopsy which was highly suspicious but inconclusive of definitive squamous cell carcinoma.  Biopsy showed atypical squamous cells and keratin.  He is here for follow-up.  We have discussed that the presentation is highly suspicious for residual carcinoma and unfortunately he is not a candidate for chemotherapy given his high risk behavior including IV drug use, candidal endocarditis and frailty.  We have discussed about referral to ENT at [facility] for surgical recommendations. I have also sent an in basket message to Dr. Isidore Moos as well as Dr. Virl Cagey for additional recommendations.  He may also benefit from PET/CT but will try to review him in the tumor board for additional recommendations.  He was recommended to contact [facility] our ENT team for his trach related questions.  They will follow-up with Dr. Gale Journey for candida endocarditis.

## 2021-09-25 NOTE — Progress Notes (Unsigned)
[place]  Telephone:(336) [phone] [phone]) [phone]   Name: David [name] Date: 09/25/2021 MRN: [id number]  DOB: August 07, 1975  Patient Care Team: Delene Ruffini, MD as PCP - General (Internal Medicine) [name], [name], RN as Oncology Nurse Navigator [name], [name], DO as Consulting Physician (Otolaryngology) [name], MD as Consulting Physician (Radiation Oncology) [name], MD as Consulting Physician (Hematology and Oncology) Pickenpack-Cousar, Carlena Sax, NP as Nurse Practitioner (Nurse Practitioner)    REASON FOR CONSULTATION: David [name] is a 45 y.o. 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 He was recently hospitalized for several months at [facility] where he received treatment for fungal endocarditis and required emergent tracheostomy and PEG tube. During hospitalization CT of chest showed concerns for residual vs recurrent tumor. Palliative requested to re-engage for ongoing support and goals of care.    SOCIAL HISTORY:     reports that he has been smoking cigarettes. He has been smoking an average of 3 packs per day. He has never used smokeless tobacco. He reports that he does not currently use alcohol after a past usage of about 12.0 standard drinks of alcohol per week. He reports that he does not currently use drugs after having used the following drugs: IV.  ADVANCE DIRECTIVES:  Patient reports he does not have advanced directives.  Would like to further discuss in the future.  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[place])    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 ([place])    Pneumococcal bacteremia 06/09/2021   Pneumothorax    Left lung spontaneous pneumothorax at age 51 yr     PAST SURGICAL HISTORY:  Past Surgical History:  Procedure Laterality Date   chest tubes Left    IR GASTROSTOMY TUBE MOD SED  06/18/2021   LAPAROSCOPIC INSERTION GASTROSTOMY TUBE N/A 06/20/2021   Procedure: LAPAROSCOPIC INSERTION GASTROSTOMY TUBE;  Surgeon: Clovis Riley, MD;  Location: [place];  Service: General;  Laterality: N/A;  DOW PATIENT   TRACHEOSTOMY TUBE PLACEMENT N/A 06/09/2021   Procedure: AWAKE TRACHEOSTOMY;  Surgeon: Melida Quitter, MD;  Location: [place];  Service: ENT;  Laterality: N/A;      ALLERGIES:  has No Known Allergies.  MEDICATIONS:  Current Outpatient Medications  Medication Sig Dispense Refill   chlorhexidine gluconate, MEDLINE KIT, (PERIDEX) 0.12 % solution Rinse  mouth with 15 mLs 2 (two) times daily. Do not eat or drink for 30 minutes. 473 mL 0   diclofenac Sodium (VOLTAREN) 1 % GEL Apply 2 g topically 4 (four) times daily as needed (pain). 100 g 2   DULoxetine (CYMBALTA) 60 MG capsule Take 1 capsule (60 mg total) by mouth daily. 90 capsule 2   folic acid (FOLVITE) 1 MG tablet Take 1 tablet (1 mg total) by mouth daily. 90 tablet 2   guaiFENesin (MUCINEX) 600 MG 12 hr tablet Take 2 tablets (1,200 mg total) by mouth 2 (two) times daily. (Patient taking differently: Take 1,200 mg by mouth 2 (two) times  daily as needed for cough or to loosen phlegm.) 60 tablet 1   hydrOXYzine (ATARAX) 25 MG tablet Place 4 tablets (100 mg total) into feeding tube 3 (three) times daily as needed for anxiety. 60 tablet 5   lidocaine (LIDODERM) 5 % Place 1 patch onto the skin daily. Remove & Discard patch within 12 hours or as  directed by MD 30 patch 2   lidocaine (XYLOCAINE) 2 % solution Patient: Mix 1part 2% viscous lidocaine, 1part H20. Swish/swallow 18m of diluted mixture, 359m before meals and _0 , up to QID prn soreness 200 mL 2   melatonin 3 MG TABS tablet Take 1 tablet (3 mg total) by mouth at bedtime as needed. 90 tablet 2   methadone (DOLOPHINE) 10 MG/ML solution Take 6 mLs (60 mg total) by mouth daily. (Patient taking differently: Take 50 mg by mouth daily.) 90 mL 0   Multiple Vitamin (MULTIVITAMIN WITH MINERALS) TABS tablet Place 1 tablet into feeding tube daily. 90 tablet 2   nicotine (NICODERM CQ - DOSED IN MG/24 HOURS) 14 mg/24hr patch Place 1 patch (14 mg total) onto the skin daily. Apply 21 mg patch daily x 6 wk, then 1412match daily x 2 wk, then 7 mg patch daily x 2 wk (Patient not taking: Reported on 06/06/2021) 14 patch 0   ondansetron (ZOFRAN) 8 MG tablet Take 1 tablet (8 mg total) by mouth every 8 (eight) hours as needed for nausea or vomiting. Starting day 3 after chemotherapy 30 tablet 2   pantoprazole (PROTONIX) 40 MG tablet Take 1 tablet (40 mg total) by mouth daily. 90 tablet 3   phenol (CHLORASEPTIC) 1.4 % LIQD Use as directed 1 spray in the mouth or throat as needed for throat irritation / pain. 177 mL 5   polyethylene glycol powder (GLYCOLAX/MIRALAX) 17 GM/SCOOP powder Place 17 g in water and drink daily. 238 g 3   sodium chloride 0.9 % nebulizer solution Take 3 mLs by nebulization every 8 (eight) hours as needed (Secretions). 90 mL 12   sodium chloride flush (NS) 0.9 % SOLN 10-40 mLs by Intracatheter route as needed (flush). 200 mL 5   thiamine (VITAMIN B1) 100 MG tablet Place 1 tablet (100 mg total) into feeding tube daily. 90 tablet 2   Zinc Oxide (TRIPLE PASTE) 12.8 % ointment Apply topically as needed for irritation. Apply to trach breakdown site 227 g 2   No current facility-administered medications for this visit.    VITAL SIGNS: There were no vitals taken for this  visit. There were no vitals filed for this visit.   Estimated body mass index is 12.29 kg/m as calculated from the following:   Height as of an earlier encounter on 09/25/21: _1  (1.803 m).   Weight as of an earlier encounter on 09/25/21: 88 lb 1.6 oz (40 kg). RADIOGRAPHIC STUDIES: CT CHEST W CONTRAST  Result Date: 08/18/2021 CLINICAL DATA:  Laryngeal cancer evaluate for metastatic disease in the chest, shortness of breath respiratory distress * Tracking Code: BO * EXAM: CT CHEST WITH CONTRAST TECHNIQUE: Multidetector CT imaging of the chest was performed during intravenous contrast administration. RADIATION DOSE REDUCTION: This exam was performed according to the departmental dose-optimization program which includes automated exposure control, adjustment of the mA and/or kV according to patient size and/or use of iterative reconstruction technique. CONTRAST:  57m88mNIPAQUE IOHEXOL 300 MG/ML  SOLN COMPARISON:  PET-CT, 05/14/2021 FINDINGS: Cardiovascular: Aortic atherosclerosis. Normal heart size. No pericardial effusion. Mediastinum/Nodes: No enlarged mediastinal, hilar, or axillary  lymph nodes. Thyroid gland, trachea, and esophagus demonstrate no significant findings. Lungs/Pleura: Tracheostomy. Status post wedge resection of the anterior left upper lobe. New, extensive, dependent bibasilar scarring bronchiectasis, most notably in the right lower lobe (series 4, image 97). Extensive, clustered centrilobular and tree-in-bud nodularity throughout the bilateral lung bases. Mild underlying paraseptal emphysema. Upper Abdomen: No acute abnormality.  Mild splenomegaly. Musculoskeletal: No chest wall abnormality. No suspicious osseous lesions identified. IMPRESSION: 1. New, extensive, dependent bibasilar scarring bronchiectasis, most notably in the right lower lobe. Extensive, clustered centrilobular and tree-in-bud nodularity throughout the bilateral lung bases. Findings are most consistent with aspiration  and associated chronic sequelae. 2. No evidence of lymphadenopathy or metastatic disease in the chest. 3. Status post wedge resection of the anterior left upper lobe. 4. Tracheostomy. 5. Emphysema. Aortic Atherosclerosis (ICD10-I70.0) and Emphysema (ICD10-J43.9). Electronically Signed   By: Delanna Ahmadi M.D.   On: 08/18/2021 16:54    PERFORMANCE STATUS (ECOG) : 1 - Symptomatic but completely ambulatory   Physical Exam General: NAD, cachectic Cardiovascular: regular rate and rhythm Pulmonary: clear ant fields, normal breathing pattern, tracheostomy midline, Passey muir valve  Abdomen: soft, nontender, + bowel sounds, PEG in place Extremities: no edema, no joint deformities Neurological: AAOx3, mood appropriate  IMPRESSION:  David [name] presents to the clinic today for follow-up post hospitalization. His mother, David [name] is also present. He was seen by [place] on today.   No acute distress noted. Well groomed. Reports his appreciation of being out of the hospital and still alive. He was discharged to his own apartment which he has not had in several years. His mother continues to support him. Reports he is tolerating tube feedings and is waiting additional home supply. Denies nausea, vomiting, diarrhea, or constipation. He is also able to take in soft foods by mouth. Appetite is improved and he is trying to gain some of the weight he has loss back.   We discussed his recent illness and future plans. David [name] and his mother are clear in expressed goals to continue to treat the treatable. He is planning on proceeding with any suggested treatments and interventions such as biopsy. He is remaining hopeful and expresses appreciation of ongoing support to allow him every opportunity to thrive.   He is actively being followed at [facility] for his methadone treatments. Encouraged to continue with visits as this is working for him.     PLAN:  Ongoing goals of care discussions Patient and mother clear in  expressed wishes to continue to treat the treatable aggressively allowing him every opportunity to continue to thrive.  I will plan to see patient back in the clinic in 3-4 weeks in collaboration with his other oncology appointments.   Patient and mother expressed understanding and was in agreement with this plan. He also understands that He can call the clinic at any time with any questions, concerns, or complaints.   Time Total: 45 min   Visit consisted of counseling and education dealing with the complex and emotionally intense issues of symptom management and palliative care in the setting of serious and potentially life-threatening illness.Greater than 50%  of this time was spent counseling and coordinating care related to the above assessment and plan.  Alda Lea, AGPCNP-BC  Palliative Medicine Team/[place]

## 2021-09-25 NOTE — Progress Notes (Signed)
[place] [facility] Follow up:   David Ruffini, MD [place] [address]   DIAGNOSIS:  Cancer Staging  Laryngeal cancer [facility]) Staging form: Larynx - Supraglottis, AJCC 8th Edition - Clinical stage from 12/01/2020: Stage IVB (cT3, cN3b, cM0) - Signed by [name], MD on 12/20/2020 Stage prefix: Initial diagnosis   SUMMARY OF ONCOLOGIC HISTORY: Oncology History  Laryngeal cancer ([place])  12/01/2020 Cancer Staging   Staging form: Larynx - Supraglottis, AJCC 8th Edition - Clinical stage from 12/01/2020: Stage IVB (cT3, cN3b, cM0) - Signed by [name], MD on 12/20/2020 Stage prefix: Initial diagnosis   12/04/2020 Initial Diagnosis   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 [facility])   12/25/2020 - 01/24/2021 Chemotherapy   Patient is on Treatment Plan : HEAD/NECK Cisplatin + XRT q21d      CURRENT THERAPY: s/p chemo and radiation  INTERVAL HISTORY:  David [name] 46 y.o. male returns for evaluation of his head neck cancer.   Patient was recently discharged after hospitalization when he was treated for fungal endocarditis, prescribed fluconazole 800 mg p.o. daily. While he was in the hospital he had a CT soft tissue of the neck and CT chest which showed findings suggestive of aspiration and associated chronic sequelae but at the same time he also had linear enhancement along the right side of the residual supraglottic and markedly irregular enhancement at the level of the false cords tissue concerning for residual or recurrent tumor.   Right level 2 and level 4 lymph nodes are stable to slightly increased in size.  Since last visit he had right lymph node biopsy which shows atypical squamous cells and keratin.  The comment states that in the proper clinical context the findings can be consistent with squamous cell carcinoma but that diagnosis cannot be definitively made on the current specimen.  He is here today with his mom.  He  was very tearful about the scary news.  He will continue on fluconazole indefinitely according to him.  He is having some struggles with the tracheostomy and recently had an episode when he had to go to the ER because he could not breathe and had some mucous plugging.  Once again they have a complicated family dynamics.  Mom asked the question if his Candida endocarditis is contagious and the patient starts crying and says that is all she is worried about.  He also mentions that he needs some help with the trach supplies and medication refills.  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   Dysgeusia 07/11/2021   S/P percutaneous endoscopic gastrostomy (PEG) tube placement ([place]) 06/24/2021   Pressure injury of skin 06/17/2021   Tracheostomy status ([place])    Heart failure with reduced ejection fraction ([place]) 04/28/2021   Elevated troponin 04/01/2021   Elevated transaminase level 04/01/2021   Weight loss, unintentional 01/15/2021   Protein-calorie malnutrition, severe 01/10/2021   AKI (acute kidney injury) ([place]) 01/08/2021   Chemotherapy induced nausea and vomiting 01/01/2021   Goals of care, counseling/discussion 12/13/2020   Laryngeal cancer ([place]) 12/04/2020   Teeth missing 12/04/2020   [place] 12/04/2020   Retained tooth root 12/04/2020   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  MEDICAL HISTORY: Past Medical History:  Diagnosis Date   Acute respiratory failure with hypoxia (HCC)    Aspiration pneumonia of right lower lobe (HCC) 06/06/2021   Bacterial endocarditis    Chronic HFrEF (heart failure with reduced ejection fraction) ([place]) 06/09/2021   Community acquired pneumonia due to Pneumococcus ([place]) 04/01/2021    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[place])    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 ([place])    Pneumococcal bacteremia 06/09/2021   Pneumothorax    Left lung spontaneous pneumothorax at age 56 yr     SURGICAL HISTORY: Past Surgical History:  Procedure Laterality Date   chest tubes Left    IR GASTROSTOMY TUBE MOD SED  06/18/2021   LAPAROSCOPIC INSERTION GASTROSTOMY TUBE N/A 06/20/2021   Procedure: LAPAROSCOPIC INSERTION GASTROSTOMY TUBE;  Surgeon: Clovis Riley, MD;  Location: [facility];  Service: General;  Laterality: N/A;  DOW PATIENT   TRACHEOSTOMY TUBE PLACEMENT N/A 06/09/2021   Procedure: AWAKE TRACHEOSTOMY;  Surgeon: Melida Quitter, MD;  Location: [facility] OR;  Service: ENT;  Laterality: N/A;    SOCIAL HISTORY: Social History   Socioeconomic History   Marital status: Divorced    Spouse name: Not on file   Number of children: Not on file   Years of education: Not on file   Highest education level: Not on file  Occupational History   Not on file  Tobacco Use   Smoking status: Every Day    Packs/day: 3.00    Types: Cigarettes   Smokeless tobacco: Never  Vaping Use   Vaping Use: Never used  Substance and Sexual Activity   Alcohol use: Not Currently    Alcohol/week: 12.0 standard drinks of alcohol    Types: 12 Cans of beer per week   Drug use: Not Currently    Types: IV    Comment: 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  Review of Systems  Constitutional:  Negative for appetite change, chills, fatigue, fever and unexpected weight change.  HENT:    Negative for hearing loss, lump/mass, sore throat and trouble swallowing.   Eyes:  Negative for eye problems and icterus.  Respiratory:  Negative for chest tightness, cough and shortness of breath.   Cardiovascular:  Negative for chest pain, leg swelling and palpitations.  Gastrointestinal:  Negative for abdominal distention, abdominal pain, constipation, diarrhea, nausea and vomiting.  Endocrine: Negative for hot flashes.  Genitourinary:  Negative for difficulty urinating.   Musculoskeletal:  Negative for arthralgias.  Skin:  Negative for itching and rash.  Neurological:  Negative for dizziness, extremity weakness, headaches and numbness.  Hematological:  Negative for adenopathy. Does not bruise/bleed easily.  Psychiatric/Behavioral:  Negative for decreased concentration and depression. The patient is not nervous/anxious.       PHYSICAL EXAMINATION  ECOG PERFORMANCE STATUS: 1 - Symptomatic but completely ambulatory  Vitals:   09/25/21 1445  BP: 119/84  Pulse: 75  Resp: 16  Temp: 97.9 F (36.6 C)  SpO2: 100%   Physical exam deferred  today in lieu of counseling  LABORATORY DATA:  CBC    Component Value Date/Time   WBC 10.5 09/14/2021 1142   RBC 4.13 (L) 09/14/2021 1142   HGB 12.0 (L) 09/14/2021 1142   HGB 14.0 03/30/2021 0856   HCT 36.9 (L) 09/14/2021 1142   PLT 334 09/14/2021 1142   PLT 308 03/30/2021 0856   MCV 89.3 09/14/2021 1142   MCV 100.9 (A) 07/27/2011 1302   MCH 29.1 09/14/2021 1142   MCHC 32.5 09/14/2021 1142   RDW 14.4 09/14/2021 1142   LYMPHSABS 0.1 (L) 08/10/2021 1025   MONOABS 0.2 08/10/2021 1025   EOSABS 0.1 08/10/2021 1025   BASOSABS 0.0 08/10/2021 1025    CMP     Component Value Date/Time   NA 133 (L) 08/16/2021 0540   K 4.4 08/16/2021 0540   CL 100 08/16/2021 0540   CO2 24 08/16/2021 0540   GLUCOSE 125 (H) 08/16/2021 0540   BUN 19 08/16/2021 0540   CREATININE 0.44 (L) 08/16/2021 0540   CREATININE 0.59 (L) 05/15/2021 1339   CALCIUM 9.3  08/16/2021 0540   PROT 6.8 08/13/2021 0301   ALBUMIN 2.6 (L) 08/13/2021 0301   AST 48 (H) 08/13/2021 0301   AST 88 (H) 03/30/2021 0856   ALT 49 (H) 08/13/2021 0301   ALT 39 03/30/2021 0856   ALKPHOS 93 08/13/2021 0301   BILITOT 0.5 08/13/2021 0301   BILITOT 0.6 03/30/2021 0856   GFRNONAA >60 08/16/2021 0540   GFRNONAA >60 05/15/2021 1339   GFRAA  01/25/2008 2040    >60        The eGFR has been calculated using the MDRD equation. This calculation has not been validated in all clinical    PENDING LABS: CBC reviewed, mild leukocytosis, otherwise no evidence of anemia or thrombocytopenia. CMP reviewed.   RADIOGRAPHIC STUDIES:  No results found.   PATHOLOGY: No new findings   ASSESSMENT and THERAPY PLAN:   Laryngeal cancer [facility]) This is a 46 year old male patient with squamous cell carcinoma of the larynx post concurrent chemotherapy and radiation with cisplatin 100 mg per metered squared received 2 out of 3 doses who is here for posttreatment follow-up.  He continues to have mucositis, but improving.  Physical examination with a palpable right cervical lymphadenopathy which is consistent with partial response so far His end of treatment PET did not show any evidence of residual disease.  He was most recently admitted with laryngeal edema, respiratory failure, fungal endocarditis, now discharged on fluconazole daily.  He is here for follow-up.  His most recent imaging showed evidence of linear enhancement along the right side of the supraglottic area and irregular enhancement at the level of the vocal cord concerning for residual or recurrent tumor.  Since his last visit he had a biopsy which was highly suspicious but inconclusive of definitive squamous cell carcinoma.  Biopsy showed atypical squamous cells and keratin.  He is here for follow-up.  We have discussed that the presentation is highly suspicious for residual carcinoma and unfortunately he is not a candidate for  chemotherapy given his high risk behavior including IV drug use, candidal endocarditis and frailty.  We have discussed about referral to ENT at [facility] for surgical recommendations. I have also sent an in basket message to Dr. Isidore Moos as well as Dr. Virl Cagey for additional recommendations.  He may also benefit from PET/CT but will try to review him in the tumor board for additional recommendations.  He was recommended to contact [facility] our  ENT team for his trach related questions.  They will follow-up with Dr. Gale Journey for candida endocarditis.   All questions were answered. The patient knows to call the clinic with any problems, questions or concerns. We can certainly see the patient much sooner if necessary.  Total encounter time: 30 minutes in face-to-face visit time, chart review, lab review, care coordination, and documentation of the encounter*  *Total Encounter Time as defined by the Centers for Medicare and Medicaid Services includes, in addition to the face-to-face time of a patient visit (documented in the note above) non-face-to-face time: obtaining and reviewing outside history, ordering and reviewing medications, tests or procedures, care coordination (communications with other health care professionals or caregivers) and documentation in the medical record.

## 2021-09-26 ENCOUNTER — Encounter: Payer: Self-pay | Admitting: Internal Medicine

## 2021-09-26 ENCOUNTER — Telehealth: Payer: Self-pay

## 2021-09-26 ENCOUNTER — Encounter: Payer: Self-pay | Admitting: Nurse Practitioner

## 2021-09-26 ENCOUNTER — Other Ambulatory Visit: Payer: Self-pay

## 2021-09-26 ENCOUNTER — Ambulatory Visit (INDEPENDENT_AMBULATORY_CARE_PROVIDER_SITE_OTHER): Payer: Medicaid Other | Admitting: Internal Medicine

## 2021-09-26 VITALS — BP 107/73 | HR 67 | Temp 98.2°F | Wt 87.0 lb

## 2021-09-26 DIAGNOSIS — B49 Unspecified mycosis: Secondary | ICD-10-CM | POA: Diagnosis not present

## 2021-09-26 DIAGNOSIS — I079 Rheumatic tricuspid valve disease, unspecified: Secondary | ICD-10-CM

## 2021-09-26 MED ORDER — FLUCONAZOLE 40 MG/ML PO SUSR: 800.0000 mg | Freq: Every day | ORAL | 3 refills | Status: DC

## 2021-09-26 MED ORDER — PREGABALIN 20 MG/ML PO SOLN: 20.0000 mg | Freq: Two times a day (BID) | ORAL | 0 refills | Status: DC

## 2021-09-26 MED ORDER — LIDOCAINE 5 % EX PTCH: 1.0000 | MEDICATED_PATCH | CUTANEOUS | 2 refills | Status: DC

## 2021-09-26 MED ORDER — FLUCONAZOLE 200 MG PO TABS: 800.0000 mg | ORAL_TABLET | Freq: Every day | ORAL | 0 refills | Status: DC

## 2021-09-26 NOTE — Telephone Encounter (Signed)
Mother called requesting something for patient's pain along with refill of Diflucan. Nikki NP e-scribed medications. Spoke with [facility] pharmacist to clarify orders and to provide Medicaid number.

## 2021-09-26 NOTE — Patient Instructions (Signed)
Continue fluconazole 800 mg once a day for your heart valve infection  See [name] in 6 weeks

## 2021-09-26 NOTE — Progress Notes (Signed)
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   Dysgeusia 07/11/2021   S/P percutaneous endoscopic gastrostomy (PEG) tube placement ([place]) 06/24/2021   Pressure injury of skin 06/17/2021   Tracheostomy status ([place])    Heart failure with reduced ejection fraction ([place]) 04/28/2021   Elevated troponin 04/01/2021   Elevated transaminase level 04/01/2021   Weight loss, unintentional 01/15/2021   Protein-calorie malnutrition, severe 01/10/2021   AKI (acute kidney injury) ([place]) 01/08/2021   Chemotherapy induced nausea and vomiting 01/01/2021   Goals of care, counseling/discussion 12/13/2020   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   MRN: [id number]  Chief Complaint  Patient presents with   Hospitalization Follow-up    HPI:  David [name] is a 46 y.o. 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 Missed dose yesterday as out of med No side effect No fever, chill, rash  He is in in active f/u with [facility] for what could possibly be recurrent head/neck cancer. He has been in remission 3-4 months  He is being followed by palliative care team and is asking me if I can do with his chronic back pain/trach site pain   No Known Allergies    Outpatient Medications Prior to Visit  Medication Sig Dispense Refill   chlorhexidine gluconate,  MEDLINE KIT, (PERIDEX) 0.12 % solution Rinse  mouth with 15 mLs 2 (two) times daily. Do not eat or drink for 30 minutes. 473 mL 0   diclofenac Sodium (VOLTAREN) 1 % GEL Apply 2 g topically 4 (four) times daily as needed (pain). 100 g 2   DULoxetine (CYMBALTA) 60 MG capsule Take 1 capsule (60 mg total) by mouth daily. 90 capsule 2   fluconazole (DIFLUCAN) 200 MG tablet Take 4 tablets (800 mg total) by mouth daily. 681 tablet 0   folic acid (FOLVITE) 1 MG tablet Take 1 tablet (1 mg total) by mouth daily. 90 tablet 2   guaiFENesin (MUCINEX) 600 MG 12 hr tablet Take 2 tablets (1,200 mg total) by mouth 2 (two) times daily. (Patient taking differently: Take 1,200 mg by mouth 2 (two) times daily as needed for cough or to loosen phlegm.) 60 tablet 1   hydrOXYzine (ATARAX) 25 MG tablet Place 4 tablets (100 mg total) into feeding tube 3 (three) times daily as needed for anxiety. 60 tablet 5   lidocaine (LIDODERM) 5 % Place 1 patch onto the skin daily. Remove & Discard patch within 12 hours or as directed by MD 30 patch 2   lidocaine (XYLOCAINE) 2 % solution Patient: Mix 1part 2% viscous lidocaine, 1part H20. Swish/swallow 69m of diluted mixture, 329m before meals and _0 , up to QID prn soreness 200 mL 2   melatonin 3 MG TABS tablet Take 1 tablet (3 mg total) by mouth at bedtime as needed. 90 tablet 2  methadone (DOLOPHINE) 10 MG/ML solution Take 6 mLs (60 mg total) by mouth daily. (Patient taking differently: Take 50 mg by mouth daily.) 90 mL 0   Multiple Vitamin (MULTIVITAMIN WITH MINERALS) TABS tablet Place 1 tablet into feeding tube daily. 90 tablet 2   ondansetron (ZOFRAN) 8 MG tablet Take 1 tablet (8 mg total) by mouth every 8 (eight) hours as needed for nausea or vomiting. Starting day 3 after chemotherapy 30 tablet 2   pantoprazole (PROTONIX) 40 MG tablet Take 1 tablet (40 mg total) by mouth daily. 90 tablet 3   phenol (CHLORASEPTIC) 1.4 % LIQD Use as directed 1 spray in the mouth or throat as  needed for throat irritation / pain. 177 mL 5   polyethylene glycol powder (GLYCOLAX/MIRALAX) 17 GM/SCOOP powder Place 17 g in water and drink daily. 238 g 3   Pregabalin 20 MG/ML SOLN 20 mg by PEG Tube route in the morning and at bedtime. 450 mL 0   sodium chloride 0.9 % nebulizer solution Take 3 mLs by nebulization every 8 (eight) hours as needed (Secretions). 90 mL 12   sodium chloride flush (NS) 0.9 % SOLN 10-40 mLs by Intracatheter route as needed (flush). 200 mL 5   thiamine (VITAMIN B1) 100 MG tablet Place 1 tablet (100 mg total) into feeding tube daily. 90 tablet 2   Zinc Oxide (TRIPLE PASTE) 12.8 % ointment Apply topically as needed for irritation. Apply to trach breakdown site 227 g 2   nicotine (NICODERM CQ - DOSED IN MG/24 HOURS) 14 mg/24hr patch Place 1 patch (14 mg total) onto the skin daily. Apply 21 mg patch daily x 6 wk, then 58m patch daily x 2 wk, then 7 mg patch daily x 2 wk (Patient not taking: Reported on 06/06/2021) 14 patch 0   No facility-administered medications prior to visit.     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  Physical Exam     General/constitutional: thin, conversant HEENT: Normocephalic; trach Neck  supple CV: rrr no mrg Lungs: clear to auscultation, normal respiratory effort Abd: Soft, Nontender Ext: no edema Skin: peg and trach site no purulence/erythema Neuro: nonfocal MSK: no peripheral joint swelling/tenderness/warmth; back spines nontender   Labs: Lab Results  Component Value Date   WBC 10.5 09/14/2021   HGB 12.0 (L) 09/14/2021   HCT 36.9 (L) 09/14/2021   MCV 89.3 09/14/2021   PLT 334 09/73/5329   Last metabolic panel Lab Results  Component Value Date   GLUCOSE 125 (H) 08/16/2021   NA 133 (L) 08/16/2021   K 4.4 08/16/2021   CL 100 08/16/2021   CO2 24 08/16/2021   BUN 19 08/16/2021   CREATININE 0.44 (L) 08/16/2021   GFRNONAA >60 08/16/2021   CALCIUM 9.3 08/16/2021   PHOS 4.2 06/25/2021   PROT 6.8 08/13/2021   ALBUMIN 2.6 (L) 08/13/2021   BILITOT 0.5 08/13/2021   ALKPHOS 93 08/13/2021   AST 48 (H) 08/13/2021   ALT 49 (H) 08/13/2021   ANIONGAP 9 08/16/2021    Micro:  Serology:  Imaging:  Assessment & Plan:   Problem List Items Addressed This Visit   None Visit Diagnoses     Endocarditis of tricuspid valve    -  Primary   Relevant Orders   CBC w/Diff   COMPLETE METABOLIC PANEL WITH GFR   Fungemia       Relevant Orders   CBC w/Diff   COMPLETE METABOLIC PANEL WITH GFR         No orders of the defined types were placed in this encounter.    Fungal tv endocarditis Candida albican Will continue his fluconazole high dose 800 mg for now. Potentially transition to lower dose at another 3-6 months No side  effect at this time clinically  Labs today Liquid form fluconazole ordered for a year F/u 4-6 weeks    Ent cancer F/u [facility]   Follow-up: Return in about 6 weeks (around 11/07/2021).      Jabier Mutton, [facility] 09/26/2021, 11:48 AM

## 2021-09-27 LAB — COMPLETE METABOLIC PANEL WITH GFR
AG Ratio: 0.8 (calc) — ABNORMAL LOW (ref 1.0–2.5)
ALT: 28 U/L (ref 9–46)
AST: 37 U/L (ref 10–40)
Albumin: 3.4 g/dL — ABNORMAL LOW (ref 3.6–5.1)
Alkaline phosphatase (APISO): 68 U/L (ref 36–130)
BUN: 13 mg/dL (ref 7–25)
CO2: 25 mmol/L (ref 20–32)
Calcium: 9.3 mg/dL (ref 8.6–10.3)
Chloride: 100 mmol/L (ref 98–110)
Creat: 0.68 mg/dL (ref 0.60–1.29)
Globulin: 4.2 g/dL (calc) — ABNORMAL HIGH (ref 1.9–3.7)
Glucose, Bld: 107 mg/dL — ABNORMAL HIGH (ref 65–99)
Potassium: 3.8 mmol/L (ref 3.5–5.3)
Sodium: 135 mmol/L (ref 135–146)
Total Bilirubin: 0.3 mg/dL (ref 0.2–1.2)
Total Protein: 7.6 g/dL (ref 6.1–8.1)
eGFR: 116 mL/min/{1.73_m2} (ref >=60)

## 2021-09-27 LAB — CBC WITH DIFFERENTIAL/PLATELET
Absolute Monocytes: 604 cells/uL (ref 200–950)
Basophils Absolute: 31 cells/uL (ref 0–200)
Basophils Relative: 0.5 %
Eosinophils Absolute: 122 cells/uL (ref 15–500)
Eosinophils Relative: 2 %
HCT: 31.6 % — ABNORMAL LOW (ref 38.5–50.0)
Hemoglobin: 10.1 g/dL — ABNORMAL LOW (ref 13.2–17.1)
Lymphs Abs: 616 cells/uL — ABNORMAL LOW (ref 850–3900)
MCH: 28.5 pg (ref 27.0–33.0)
MCHC: 32 g/dL (ref 32.0–36.0)
MCV: 89.3 fL (ref 80.0–100.0)
MPV: 11.2 fL (ref 7.5–12.5)
Monocytes Relative: 9.9 %
Neutro Abs: 4728 cells/uL (ref 1500–7800)
Neutrophils Relative %: 77.5 %
Platelets: 220 10*3/uL (ref 140–400)
RBC: 3.54 10*6/uL — ABNORMAL LOW (ref 4.20–5.80)
RDW: 13.9 % (ref 11.0–15.0)
Total Lymphocyte: 10.1 %
WBC: 6.1 10*3/uL (ref 3.8–10.8)

## 2021-09-28 ENCOUNTER — Telehealth: Payer: Self-pay | Admitting: Hematology and Oncology

## 2021-09-28 NOTE — Telephone Encounter (Signed)
Scheduled appointment per 8/29 los. Patient is aware.

## 2021-10-03 NOTE — Progress Notes (Signed)
Oncology Nurse Navigator Documentation   I placed a call to the referral coordinator at [facility] for Mr. Marsalis to get scheduled at the request of Dr. Chryl Heck. He is scheduled on 10/17/21 at 9am to see Dr. Nicolette Bang. I attempted to call Mr. Eichelberger to inform him of the appointment but the call would not go through. I then called his mother [name] to inform her of the date and time and she agreed to the appointment. She is aware that they will mail a packet to his address with further information.  Harlow Asa RN, BSN, OCN Head & Neck Oncology Nurse [place] at [facility] Phone # [phone]  Fax # [phone]

## 2021-10-04 ENCOUNTER — Telehealth: Payer: Self-pay

## 2021-10-04 ENCOUNTER — Other Ambulatory Visit: Payer: Self-pay | Admitting: Pharmacist

## 2021-10-04 DIAGNOSIS — B49 Unspecified mycosis: Secondary | ICD-10-CM

## 2021-10-04 MED ORDER — FLUCONAZOLE 200 MG PO TABS: 800.0000 mg | ORAL_TABLET | Freq: Every day | ORAL | 3 refills | Status: DC

## 2021-10-04 NOTE — Telephone Encounter (Signed)
If patient is able and willing to swallow tablets, it will be the 4 tablets of '200mg'$  since that's the highest strength tablet. Is he willing to take all four tablets together once daily? Have then been out of fluconazole at all due to these issues?

## 2021-10-04 NOTE — Telephone Encounter (Signed)
Thanks

## 2021-10-04 NOTE — Telephone Encounter (Signed)
Yes patient can take the tablets. Patient's mother states patient was previously crushing the tablets to put them in his feeding tube. Patient has not missed any doses and after today he will be completely out.  David [name] T Brooks Sailors

## 2021-10-04 NOTE — Telephone Encounter (Signed)
Sending script now - thanks!

## 2021-10-04 NOTE — Telephone Encounter (Signed)
Attempted to call patient to verify that he will take all four tablets together once daily, and whether he has had any missed doses.  No answer and unable to leave a voicemail. Will send MyChart message.  Binnie Kand, RN

## 2021-10-04 NOTE — Telephone Encounter (Signed)
Received call from patient's mother, [name]. States that pharmacy has had difficulty filling prescription for fluconazole suspension, and that the patient would like to go back to taking the pill form.  Asks if it is ok to discontinue suspension and switch back to fluconazole tablets. Confirmed that preferred pharmacy is [facility].  [name] stated the pharmacist said they would call her if the prescription was changed/refilled.  Routed to provider and [facility] pharmacists.  Binnie Kand, RN

## 2021-10-05 ENCOUNTER — Ambulatory Visit: Payer: Medicaid Other | Admitting: Hematology and Oncology

## 2021-10-08 ENCOUNTER — Telehealth: Payer: Self-pay | Admitting: *Deleted

## 2021-10-08 NOTE — Telephone Encounter (Signed)
Called patient's mother David [name]) to inform of fu appt. with Dr. Isidore Moos on 10-10-21 @ 11 am, spoke with patient's mother David [name]) and she is aware of this appt.

## 2021-10-10 ENCOUNTER — Ambulatory Visit
Admission: RE | Admit: 2021-10-10 | Discharge: 2021-10-10 | Disposition: A | Discharge status: Home / Self Care | Payer: Medicaid Other | Source: Ambulatory Visit | Attending: Radiation Oncology | Admitting: Radiation Oncology

## 2021-10-10 ENCOUNTER — Other Ambulatory Visit: Payer: Self-pay

## 2021-10-10 ENCOUNTER — Encounter: Payer: Self-pay | Admitting: Radiation Oncology

## 2021-10-10 VITALS — BP 100/78 | HR 77 | Temp 97.6°F | Resp 20 | Ht 71.0 in | Wt 89.4 lb

## 2021-10-10 DIAGNOSIS — F17210 Nicotine dependence, cigarettes, uncomplicated: Secondary | ICD-10-CM | POA: Diagnosis not present

## 2021-10-10 DIAGNOSIS — F419 Anxiety disorder, unspecified: Secondary | ICD-10-CM | POA: Insufficient documentation

## 2021-10-10 DIAGNOSIS — C329 Malignant neoplasm of larynx, unspecified: Secondary | ICD-10-CM

## 2021-10-10 DIAGNOSIS — Z79899 Other long term (current) drug therapy: Secondary | ICD-10-CM | POA: Insufficient documentation

## 2021-10-10 DIAGNOSIS — C321 Malignant neoplasm of supraglottis: Secondary | ICD-10-CM

## 2021-10-10 DIAGNOSIS — C328 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: Secondary | ICD-10-CM | POA: Insufficient documentation

## 2021-10-10 MED ORDER — LIDOCAINE VISCOUS HCL 2 % MT SOLN: OROMUCOSAL | 5 refills | Status: DC

## 2021-10-10 NOTE — Progress Notes (Signed)
Oncology Nurse Navigator Documentation   I met with David [name] and his mom during his follow up appointment with Dr. Isidore Moos today. He will meet with Dr. Nicolette Bang to discuss further treatment options. He will see Lexine Baton on 9/26 for a palliative care visit, see Dr. Chryl Heck in October, and Dr. Isidore Moos again in December. At his mother's request I have also contacted respiratory at [facility] and left a message asking them to contact him for an appointment with the trach clinic. David [name] and his mom know how to contact me if needed.   Harlow Asa RN, BSN, OCN Head & Neck Oncology Nurse [place] at [facility] Phone # [phone]  Fax # [phone]

## 2021-10-10 NOTE — Progress Notes (Signed)
Radiation Oncology         [phone]) [phone] ________________________________  Name: David [name] MRN: [id number]  Date: 10/10/2021  DOB: 1975/12/10  Follow-Up Visit Note  CC: David Ruffini, MD  [name], [name], DO  Diagnosis and Prior Radiotherapy:       ICD-10-CM   1. 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)  C32.8 lidocaine (XYLOCAINE) 2 % solution    3. 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 He mother is present with him today.  Our head and neck navigator, David [name], is present as well.  Since I last saw him he has spent quite [name] bit of time in the hospital.  During his hospitalization this summer CT scan of his neck was concerning for residual disease.  I have personally reviewed his images and I shared them with the patient and his mom today.  There is concern for persistent disease in his neck and his larynx.  He is seeing Dr. Fredric [name] of otolaryngology and she has referred him to the [facility] clinic in [place] for further work-up and surgery if indicated.  Biopsy of his neck in August showed atypical cells but no definitive proof of residual cancer.  He is wondering when he will get [name] PET scan.  Dr. Chryl [name] recommended that he wait until he sees otolaryngology at [facility]  He reports anxiety and feeling upset about his uncertain prognosis, the discomfort of his trach, and ambivalence about undergoing [name] laryngectomy if it's  recommended.  He is quite hoarse.  He is having trouble with weight loss.  He still has [name] feeding tube.  Completed radiation to the larynx on 02-09-2021.  Pain issues, if any:  Pain is all over Using [name] feeding tube?:  Yes,States that he eats orally,but use feeding tube at night Weight changes, if any: States that he has lost alot of weight and today he weighs 89.4lbs  Wt Readings from Last 3 Encounters:  10/10/21 89 lb 6.4 oz (40.6 kg)  09/26/21 87 lb (39.5 kg)  09/25/21 88 lb 1.6 oz (40 kg)    Swallowing issues, if any: States that he cannot get his food down without drinking fluids to push it down Smoking or chewing tobacco? States that he smoke 2 to 3 cigarettes daily Using fluoride trays daily? No Last ENT visit was on: 08/30/2021 Other notable issues, if any:  Vitals:   10/10/21 1109  BP: 100/78  Pulse: 77  Resp: 20  Temp: 97.6 F (36.4 C)  SpO2: 100%  Weight: 89 lb 6.4 oz (40.6 kg)  Height: _0  (1.803 m)       ALLERGIES:  has No Known Allergies.  Meds: Current Outpatient Medications  Medication Sig Dispense Refill   chlorhexidine gluconate, MEDLINE KIT, (PERIDEX) 0.12 % solution Rinse  mouth with 15 mLs 2 (two) times daily. Do not eat or drink for 30 minutes. 473 mL 0   diclofenac Sodium (VOLTAREN) 1 % GEL Apply 2  g topically 4 (four) times daily as needed (pain). 100 g 2   DULoxetine (CYMBALTA) 60 MG capsule Take 1 capsule (60 mg total) by mouth daily. 90 capsule 2   fluconazole (DIFLUCAN) 200 MG tablet Take 4 tablets (800 mg total) by mouth daily. 174 tablet 3   folic acid (FOLVITE) 1 MG tablet Take 1 tablet (1 mg total) by mouth daily. 90 tablet 2   guaiFENesin (MUCINEX) 600 MG 12 hr tablet Take 2 tablets (1,200 mg total) by mouth 2 (two) times daily. (Patient taking differently: Take 1,200 mg by mouth 2 (two) times daily as needed for cough or to loosen phlegm.) 60 tablet 1   hydrOXYzine (ATARAX) 25 MG tablet Place 4 tablets (100 mg total) into feeding tube 3  (three) times daily as needed for anxiety. 60 tablet 5   lidocaine (LIDODERM) 5 % Place 1 patch onto the skin daily. Remove & Discard patch within 12 hours or as directed by MD 30 patch 2   lidocaine (XYLOCAINE) 2 % solution Patient: Mix 1part 2% viscous lidocaine, 1part H20. Swish and spit 73m of diluted mixture up to eight times [name] day, prn soreness 200 mL 5   melatonin 3 MG TABS tablet Take 1 tablet (3 mg total) by mouth at bedtime as needed. 90 tablet 2   methadone (DOLOPHINE) 10 MG/ML solution Take 6 mLs (60 mg total) by mouth daily. (Patient taking differently: Take 50 mg by mouth daily.) 90 mL 0   Multiple Vitamin (MULTIVITAMIN WITH MINERALS) TABS tablet Place 1 tablet into feeding tube daily. 90 tablet 2   nicotine (NICODERM CQ - DOSED IN MG/24 HOURS) 14 mg/24hr patch Place 1 patch (14 mg total) onto the skin daily. Apply 21 mg patch daily x 6 wk, then 149mpatch daily x 2 wk, then 7 mg patch daily x 2 wk (Patient not taking: Reported on 06/06/2021) 14 patch 0   ondansetron (ZOFRAN) 8 MG tablet Take 1 tablet (8 mg total) by mouth every 8 (eight) hours as needed for nausea or vomiting. Starting day 3 after chemotherapy 30 tablet 2   pantoprazole (PROTONIX) 40 MG tablet Take 1 tablet (40 mg total) by mouth daily. 90 tablet 3   phenol (CHLORASEPTIC) 1.4 % LIQD Use as directed 1 spray in the mouth or throat as needed for throat irritation / pain. 177 mL 5   polyethylene glycol powder (GLYCOLAX/MIRALAX) 17 GM/SCOOP powder Place 17 g in water and drink daily. 238 g 3   Pregabalin 20 MG/ML SOLN 20 mg by PEG Tube route in the morning and at bedtime. 60 mL 0   sodium chloride 0.9 % nebulizer solution Take 3 mLs by nebulization every 8 (eight) hours as needed (Secretions). 90 mL 12   sodium chloride flush (NS) 0.9 % SOLN 10-40 mLs by Intracatheter route as needed (flush). 200 mL 5   thiamine (VITAMIN B1) 100 MG tablet Place 1 tablet (100 mg total) into feeding tube daily. 90 tablet 2   Zinc Oxide (TRIPLE  PASTE) 12.8 % ointment Apply topically as needed for irritation. Apply to trach breakdown site 227 g 2   No current facility-administered medications for this encounter.    Physical Findings: The patient is in no acute distress. Patient is alert and oriented. Wt Readings from Last 3 Encounters:  10/10/21 89 lb 6.4 oz (40.6 kg)  09/26/21 87 lb (39.5 kg)  09/25/21 88 lb 1.6 oz (40 kg)    height is _0  (1.803 m) and weight  is 89 lb 6.4 oz (40.6 kg). His temperature is 97.6 F (36.4 C). His blood pressure is 100/78 and his pulse is 77. His respiration is 20 and oxygen saturation is 100%. .  General: Alert and oriented, in mild distress HEENT: Head is normocephalic. Extraocular movements are intact. Oropharynx is notable for no tumor in upper throat.  His teeth are brittle.  He is still hoarse.  Trach present.  Neck: Neck is notable for mass in the right level 2 region corresponding dominant node on imaging Abd: PEG tube present MSK: Muscle wasting, ambulatory   ECOG = 3  0 - Asymptomatic (Fully active, able to carry on all predisease activities without restriction)  1 - Symptomatic but completely ambulatory (Restricted in physically strenuous activity but ambulatory and able to carry out work of [name] light or sedentary nature. For example, light housework, office work)  2 - Symptomatic, <50% in bed during the day (Ambulatory and capable of all self care but unable to carry out any work activities. Up and about more than 50% of waking hours)  3 - Symptomatic, >50% in bed, but not bedbound (Capable of only limited self-care, confined to bed or chair 50% or more of waking hours)  4 - Bedbound (Completely disabled. Cannot carry on any self-care. Totally confined to bed or chair)  5 - Death   Eustace Pen MM, Creech RH, Tormey DC, et al. [phone]). "Toxicity and response criteria of the [facility]". [place] Oncol. 5 (6): 649-55  Lab Findings: Lab Results  Component  Value Date   WBC 6.1 09/26/2021   HGB 10.1 (L) 09/26/2021   HCT 31.6 (L) 09/26/2021   MCV 89.3 09/26/2021   PLT 220 09/26/2021    Lab Results  Component Value Date   TSH 1.957 05/15/2021        Latest Ref Rng & Units 09/26/2021   12:13 PM 08/16/2021    5:40 AM 08/13/2021    3:01 AM  BMP  Glucose 65 - 99 mg/dL 107  125  107   BUN 7 - 25 mg/dL _0 Creatinine 0.60 - 1.29 mg/dL 0.68  0.44  0.58   BUN/Creat Ratio 6 - 22 (calc) SEE NOTE:     Sodium 135 - 146 mmol/L 135  133  132   Potassium 3.5 - 5.3 mmol/L 3.8  4.4  4.8   Chloride 98 - 110 mmol/L 100  100  95   CO2 20 - 32 mmol/L _1 Calcium 8.6 - 10.3 mg/dL 9.3  9.3  9.1     Radiographic Findings: [name] CORE BIOPSY (LYMPH NODES)  Result Date: 09/14/2021 INDICATION: 46 year old male with history of laryngeal cancer presenting with right neck mass of indeterminate etiology. EXAM: Ultrasound-guided right neck mass biopsy MEDICATIONS: None. ANESTHESIA/SEDATION: Moderate (conscious) sedation was employed during this procedure. [name] total of Versed 1 mg and Fentanyl 50 mcg was administered intravenously. Moderate Sedation Time: 14 minutes. The patient's level of consciousness and vital signs were monitored continuously by radiology nursing throughout the procedure under my direct supervision. FLUOROSCOPY TIME:  None. COMPLICATIONS: None immediate. PROCEDURE: Informed written consent was obtained from the patient after [name] thorough discussion of the procedural risks, benefits and alternatives. All questions were addressed. Maximal Sterile Barrier Technique was utilized including caps, mask, sterile gowns, sterile gloves, sterile drape, hand hygiene and skin antiseptic. [name] timeout was performed prior to the initiation of the procedure. Preprocedure ultrasound evaluation  of the right neck demonstrated right cervical level 3 ovoid heterogeneously hypoechoic mass with peripheral calcification. The procedure was planned. The right neck was  prepped and draped in standard fashion. Subdermal Local anesthesia was administered at the planned needle entry site. Deeper local anesthetic was administered under ultrasound guidance to the periphery of the targeted mass. [name] small skin nick was made. Under direct ultrasound visualization, [name] 17 gauge coaxial introducer needle was introduced in the periphery of the mass. Next, [name] total of 5, 18 gauge core biopsies were obtained and placed in formalin. The needle was removed. Postprocedure ultrasound demonstrated no evidence of surrounding hematoma or other complicating features. Hemostasis was achieved with brief manual compression. [name] sterile bandage was applied. The patient tolerated the procedure well was transferred to the recovery area in good condition. IMPRESSION: Technically successful ultrasound-guided right cervical mass biopsy. Ruthann Cancer, MD [facility] Radiology Electronically Signed   By: Ruthann Cancer M.D.   On: 09/14/2021 15:41    Impression/Plan:    1) Head and Neck Cancer Status: I personally showed the patient his neck CT.  We discussed that there is [name] concern for residual disease but that more tissue would need to be obtained to prove this and rule out other potential explanations such as necrotic tissue in the neck and/or larynx.  He had questions about many things which I answered to the best of my ability today.  He understands that [name] PET scan may be recommended by [facility] otolaryngology when he sees them next week.  We discussed his nutritional status and the fact that gaining weight is important to make him eligible for aggressive salvage therapies (such as neck dissection or laryngectomy) if they are needed.   We also talked about palliative care and comfort measures.  He sees palliative care next week.  He understands that if he does have residual disease that salvage surgery would be significant and difficult and it would not  guarantee cure.  Palliative care will continue to have discussions with him about his goals of care as information evolves in his work-up  2) Nutritional Status:  Wt Readings from Last 3 Encounters:  10/10/21 89 lb 6.4 oz (40.6 kg)  09/26/21 87 lb (39.5 kg)  09/25/21 88 lb 1.6 oz (40 kg)  He continues to lose weight and appears to have been lost to follow-up with nutrition.  I will refer him back.  We spoke quite [name] bit about the importance of nutrition to be eligible for treatments as needed in the future  3) Risk Factors: The patient has been educated about risk factors including alcohol and tobacco abuse; they understand that avoidance of alcohol and tobacco is important to prevent recurrences as well as other cancers  He continues to smoke cigarettes and does not appear motivated to quit; he also has [name] history of illicit drug use  4) Swallowing: He was again lost to follow-up with swallowing therapy.  I am not sure the patient will be amenable with appointments if we refer him back but we will try  5) Dental: He has very brittle teeth.  Dr. Benson Norway states that he is eligible for extractions in the future but the timing is not prudent given all the other things that he has on his plate.  I gave him [name] prescription for more lidocaine mouthwash to help with dental pain  6) Thyroid function: WNL -I recommended we recheck this today but he became tearful at the thought  of undergoing lab work so we will arrange this for October when he sees medical oncology Lab Results  Component Value Date   TSH 1.957 05/15/2021   7) He will follow-up with me in 3 months  - sooner if needed.  Emotional support given to the patient and his mom today.  On date of service, in total, I spent 35 minutes on this encounter. Patient was seen in person. _____________________________________   David Gibson, MD

## 2021-10-10 NOTE — Progress Notes (Addendum)
Mr Tilly is here for a follow -up appointment today with Dr. Isidore Moos.Completed radiation to the larynx on 02-09-2021.  Pain issues, if any:  Pain is all over Using a feeding tube?:  Yes,States that he eats orally,but use feeding tube at night Weight changes, if any: States that he has lost alot of weight . States he was 120 and today he weight  89.4lbs Swallowing issues, if any: States that he cannot get his food down without drinking fluids to push it down Smoking or chewing tobacco? States that he smoke 2 to 3 cigarettes daily Using fluoride trays daily? No Last ENT visit was on: 08/30/2021 Other notable issues, if any:  Vitals:   10/10/21 1109  BP: 100/78  Pulse: 77  Resp: 20  Temp: 97.6 F (36.4 C)  SpO2: 100%  Weight: 40.6 kg  Height: '5\' 11"'$  (1.803 m)

## 2021-10-15 ENCOUNTER — Other Ambulatory Visit: Payer: Self-pay

## 2021-10-15 DIAGNOSIS — C329 Malignant neoplasm of larynx, unspecified: Secondary | ICD-10-CM

## 2021-10-16 ENCOUNTER — Ambulatory Visit (HOSPITAL_COMMUNITY)
Admission: RE | Admit: 2021-10-16 | Discharge: 2021-10-16 | Disposition: A | Discharge status: Home / Self Care | Payer: Medicaid Other | Source: Ambulatory Visit | Attending: Acute Care | Admitting: Acute Care

## 2021-10-16 DIAGNOSIS — Z930 Tracheostomy status: Secondary | ICD-10-CM

## 2021-10-16 DIAGNOSIS — C329 Malignant neoplasm of larynx, unspecified: Secondary | ICD-10-CM | POA: Diagnosis present

## 2021-10-16 DIAGNOSIS — L308 Other specified dermatitis: Secondary | ICD-10-CM | POA: Insufficient documentation

## 2021-10-16 DIAGNOSIS — Z931 Gastrostomy status: Secondary | ICD-10-CM

## 2021-10-16 DIAGNOSIS — R634 Abnormal weight loss: Secondary | ICD-10-CM | POA: Diagnosis present

## 2021-10-16 NOTE — Consult Note (Signed)
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 He completed XRT 02/09/21. Recently been referred to [facility] from local ENT for persistent/residual disease in his neck and larynx. ENT from [facility]:   Presents to trach clinic for routine trach care and maintenance   Past Medical History:  Diagnosis Date   Acute respiratory failure with hypoxia ([place])    Aspiration pneumonia of right lower lobe (HCC) 06/06/2021   Bacterial endocarditis    Chronic HFrEF (heart failure with reduced ejection fraction) ([place]) 06/09/2021   Community acquired pneumonia due to Pneumococcus ([place]) 04/01/2021   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[place])    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 ([place])    Pneumococcal bacteremia 06/09/2021   Pneumothorax    Left lung spontaneous pneumothorax at age 42 yr    Past Surgical History:  Procedure Laterality Date   chest tubes Left    IR GASTROSTOMY TUBE MOD SED  06/18/2021   LAPAROSCOPIC INSERTION GASTROSTOMY TUBE N/A 06/20/2021   Procedure: LAPAROSCOPIC INSERTION GASTROSTOMY TUBE;  Surgeon: Clovis Riley, MD;  Location: [place];  Service: General;  Laterality: N/A;  DOW PATIENT   TRACHEOSTOMY TUBE PLACEMENT N/A 06/09/2021   Procedure: AWAKE TRACHEOSTOMY;  Surgeon: Melida Quitter, MD;  Location: [place];  Service: ENT;  Laterality: N/A;  Social hx   reports that he has been smoking cigarettes. He has been smoking an average of 3 packs per day. He has never used smokeless tobacco. He reports that he does not currently use alcohol after a past usage of about 12.0 standard drinks of alcohol per week. He reports that he does not currently use drugs after having used the following drugs: IV.   Family hx No family history on file.   No Known Allergies Prior to Admission medications   Medication Sig Start Date End Date Taking? Authorizing Provider  chlorhexidine gluconate, MEDLINE KIT, (PERIDEX) 0.12 % solution Rinse  mouth with 15 mLs 2  (two) times daily. Do not eat or drink for 30 minutes. 08/22/21   Lacinda Axon, MD  diclofenac Sodium (VOLTAREN) 1 % GEL Apply 2 g topically 4 (four) times daily as needed (pain). 08/22/21   Lacinda Axon, MD  DULoxetine (CYMBALTA) 60 MG capsule Take 1 capsule (60 mg total) by mouth daily. 08/22/21   Lacinda Axon, MD  fluconazole (DIFLUCAN) 200 MG tablet Take 4 tablets (800 mg total) by mouth daily. 10/04/21   Esmond Plants, RPH-CPP  folic acid (FOLVITE) 1 MG tablet Take 1 tablet (1 mg total) by mouth daily. 08/22/21   Lacinda Axon, MD  guaiFENesin (MUCINEX) 600 MG 12 hr tablet Take 2 tablets (1,200 mg total) by mouth 2 (two) times daily. Patient taking differently: Take 1,200 mg by mouth 2 (two) times daily as needed for cough or to loosen phlegm. 02/26/21   Pickenpack-Cousar, Carlena Sax, NP  hydrOXYzine (ATARAX) 25 MG tablet Place 4 tablets (100 mg total) into feeding tube 3 (three) times daily as needed for anxiety. 08/22/21   Lacinda Axon, MD  lidocaine (LIDODERM) 5 % Place 1 patch onto the skin daily. Remove & Discard patch within 12 hours or as directed by MD 09/26/21   Pickenpack-Cousar, Carlena Sax, NP  lidocaine (XYLOCAINE) 2 % solution Patient: Mix 1part 2% viscous lidocaine, 1part H20. Swish and spit 47m of diluted mixture up to eight times a day, prn soreness 10/10/21   SEppie Gibson MD  melatonin 3  MG TABS tablet Take 1 tablet (3 mg total) by mouth at bedtime as needed. 08/22/21   Lacinda Axon, MD  methadone (DOLOPHINE) 10 MG/ML solution Take 6 mLs (60 mg total) by mouth daily. Patient taking differently: Take 50 mg by mouth daily. 01/08/21   Pickenpack-Cousar, Carlena Sax, NP  Multiple Vitamin (MULTIVITAMIN WITH MINERALS) TABS tablet Place 1 tablet into feeding tube daily. 08/22/21   Lacinda Axon, MD  nicotine (NICODERM CQ - DOSED IN MG/24 HOURS) 14 mg/24hr patch Place 1 patch (14 mg total) onto the skin daily. Apply 21 mg patch daily x 6 wk, then 42m  patch daily x 2 wk, then 7 mg patch daily x 2 wk Patient not taking: Reported on 06/06/2021 02/26/21   SEppie Gibson MD  ondansetron (ZOFRAN) 8 MG tablet Take 1 tablet (8 mg total) by mouth every 8 (eight) hours as needed for nausea or vomiting. Starting day 3 after chemotherapy 05/17/21   IBenay Pike MD  pantoprazole (PROTONIX) 40 MG tablet Take 1 tablet (40 mg total) by mouth daily. 08/22/21 08/22/22  ALacinda Axon MD  phenol (CHLORASEPTIC) 1.4 % LIQD Use as directed 1 spray in the mouth or throat as needed for throat irritation / pain. 08/22/21   ALacinda Axon MD  polyethylene glycol powder (GLYCOLAX/MIRALAX) 17 GM/SCOOP powder Place 17 g in water and drink daily. 08/22/21   ALacinda Axon MD  Pregabalin 20 MG/ML SOLN 20 mg by PEG Tube route in the morning and at bedtime. 09/26/21   Pickenpack-Cousar, ACarlena Sax NP  sodium chloride 0.9 % nebulizer solution Take 3 mLs by nebulization every 8 (eight) hours as needed (Secretions). 08/22/21   ALacinda Axon MD  sodium chloride flush (NS) 0.9 % SOLN 10-40 mLs by Intracatheter route as needed (flush). 08/22/21   ALacinda Axon MD  thiamine (VITAMIN B1) 100 MG tablet Place 1 tablet (100 mg total) into feeding tube daily. 08/22/21   ALacinda Axon MD  Zinc Oxide (TRIPLE PASTE) 12.8 % ointment Apply topically as needed for irritation. Apply to trach breakdown site 08/22/21   ALacinda Axon MD    Review of Systems  Constitutional:  Positive for weight loss. Negative for diaphoresis, fever and malaise/fatigue.  HENT: Negative.    Eyes: Negative.   Respiratory:  Positive for sputum production and shortness of breath.   Cardiovascular: Negative.   Gastrointestinal: Negative.   Genitourinary: Negative.   Musculoskeletal: Negative.   Skin:  Positive for rash.  Endo/Heme/Allergies: Negative.      Exam  General very frail 46year old severely malnourished male who is ambulatory and walked into the tracheostomy clinic  in no acute distress HEENT normocephalic with the exception of poor dentition, and severe temporal wasting.  Pupils equal reactive his size 4 tracheostomy is cuffless, he has red excoriated skin around tracheostomy stoma, his phonation quality is hoarse with PMV Pulm: Scattered rhonchi no accessory use Cardiac: Regular rate and rhythm Abdomen soft PEG placed  Extremities warm no edema  Procedure: The existing tracheostomy was removed, the site was inspected and found to be red and excoriated as mentioned in exam.  At this point a lubricated size 4 cuffless Shiley trach was placed over obturator, patient tolerated well, placement confirmed via end-tidal CO2.  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 He is s/p treatment but unfortunately imaging suggests on-going disease. He has been referred to [facility] ENT to discuss further treatment options which may include surgery. Based on current clinical situation I do not think he is a candidate for decannulation and we will plan on taking a supportive role in his care for routine trach management, deferring to ENT re: changes of care.   Plan  ROV 6 weeks for planned trach change Continue routine trach care     Dermatitis associated with moisture Overview: Has had trouble w/ moisture in past Plan Will order Drawtex moisture pads. Sent two with him      My time 32 min   Erick Colace ACNP-BC [place] Pager # [phone] OR # [phone] if no answer

## 2021-10-16 NOTE — Progress Notes (Signed)
Tracheostomy Procedure Note  David [name] [id number] 02-May-1975  Pre Procedure Tracheostomy Information  Trach Brand: Shiley Size:  4.0  [id number] Style: Uncuffed Secured by: Velcro   Procedure: Trach Change and  Trach Cleaning    Post Procedure Tracheostomy Information  Trach Brand: Shiley Size:  4.0 [id number] Style: Uncuffed Secured by: Velcro   Post Procedure Evaluation:  ETCO2 positive color change from yellow to purple : Yes.   Vital signs:VSS Patients current condition: stable Complications: No apparent complications Trach site exam:  dried secretions and redness Wound care done: 4 x 4 gauze drain Patient did tolerate procedure well.   Education: none  Prescription needs: none    Additional needs: none

## 2021-10-16 NOTE — Assessment & Plan Note (Addendum)
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 He is s/p treatment but unfortunately imaging suggests on-going disease. He has been referred to [facility] ENT to discuss further treatment options which may include surgery. Based on current clinical situation I do not think he is a candidate for decannulation and we will plan on taking a supportive role in his care for routine trach management, deferring to ENT re: changes of care.   Plan  ROV 6 weeks for planned trach change Continue routine trach care

## 2021-10-22 ENCOUNTER — Telehealth: Payer: Self-pay | Admitting: *Deleted

## 2021-10-22 NOTE — Telephone Encounter (Signed)
Called patient's mom Nyoka Cowden) to inform of Pet Scan on 11-12-21, patient declined due to having Pet arranged on 10-25-21 @ [facility], biopsy to be done @ [facility]  on 11-08-21

## 2021-10-23 ENCOUNTER — Inpatient Hospital Stay: Payer: Medicaid Other | Admitting: Dietician

## 2021-10-23 ENCOUNTER — Inpatient Hospital Stay: Payer: Medicaid Other | Attending: Radiation Oncology | Admitting: Nurse Practitioner

## 2021-10-23 ENCOUNTER — Other Ambulatory Visit: Payer: Self-pay

## 2021-10-23 ENCOUNTER — Encounter: Payer: Self-pay | Admitting: Nurse Practitioner

## 2021-10-23 ENCOUNTER — Ambulatory Visit: Payer: Medicaid Other | Attending: Radiation Oncology

## 2021-10-23 VITALS — BP 105/79 | HR 72 | Temp 98.0°F | Resp 18 | Ht 71.0 in | Wt 93.2 lb

## 2021-10-23 DIAGNOSIS — R530 Neoplastic (malignant) related fatigue: Secondary | ICD-10-CM

## 2021-10-23 DIAGNOSIS — G893 Neoplasm related pain (acute) (chronic): Secondary | ICD-10-CM

## 2021-10-23 DIAGNOSIS — F17210 Nicotine dependence, cigarettes, uncomplicated: Secondary | ICD-10-CM | POA: Insufficient documentation

## 2021-10-23 DIAGNOSIS — R490 Dysphonia: Secondary | ICD-10-CM | POA: Diagnosis present

## 2021-10-23 DIAGNOSIS — C328 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: Secondary | ICD-10-CM | POA: Diagnosis present

## 2021-10-23 DIAGNOSIS — R1310 Dysphagia, unspecified: Secondary | ICD-10-CM | POA: Insufficient documentation

## 2021-10-23 DIAGNOSIS — Z930 Tracheostomy status: Secondary | ICD-10-CM | POA: Insufficient documentation

## 2021-10-23 DIAGNOSIS — Z515 Encounter for palliative care: Secondary | ICD-10-CM

## 2021-10-23 MED ORDER — PREGABALIN 20 MG/ML PO SOLN: 20.0000 mg | Freq: Two times a day (BID) | ORAL | 1 refills | Status: DC

## 2021-10-23 NOTE — Progress Notes (Signed)
Nutrition Follow-up:  Patient completed concurrent chemoradiation for larynex cancer. Last radiation 02/09/21. Possible recurrence. He is pending PET scan + biopsy at [facility].  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 Patient is not pleased with having trach. He says he is unable to breath good. Mucous is constant. Patient is using feeding tube some. Reports more so in the past couple of weeks giving one carton Osmolite 1.5 three times daily + Prosource TF BID. Patient says he has gained ~7 lbs by doing this. He is drinking mostly water and a couple of sodas by mouth. Patient tolerates soft foods. States he is just a vesicle for the drug and is punished everyday for his choices. He is now being punished by loosing his teeth. Says the punishment won't stop even after I am in the ground. Patient agreeable to spiritual care referral.   Medications: reviewed   Labs: no new labs for review  Anthropometrics: last wt 89 lb 6.4 oz on 9/13   NUTRITION DIAGNOSIS: Severe malnutrition continues   INTERVENTION:  Continue giving one carton Osmolite 1.5 TID with Prosource TF BID Pt receiving formula + supplies from [place] per mom - pt given additional bolus syringes today per request Encouraged soft moist foods as tolerated Suggested drinking 1-2 Ensure Plus/equivalent for added calories/protein One complimentary case Ensure Plus HP provided  Supportive listening and encouragement  Will place referral to spiritual services  Contact information provided    MONITORING, EVALUATION, GOAL: weight trends, intake, tube feeding, test results/plan of care   NEXT VISIT: To be scheduled

## 2021-10-23 NOTE — Therapy (Addendum)
OUTPATIENT SPEECH LANGUAGE PATHOLOGY SWALLOW EVALUATION   Patient Name: David [name] MRN: [id number] DOB:[birth year], 46 y.o., male Today's Date: 10/23/2021  PCP: Delene Ruffini, MD REFERRING PROVIDER: [name], MD   End of Session - 10/23/21 1401     Visit Number 1    Number of Visits 4    Date for SLP Re-Evaluation 12/24/21    Authorization Type Medicaid [place] Access    SLP Start Time [phone]    SLP Stop Time  61    SLP Time Calculation (min) 37 min    Activity Tolerance Patient tolerated treatment well             Past Medical History:  Diagnosis Date   Acute respiratory failure with hypoxia (HCC)    Aspiration pneumonia of right lower lobe ([place]) 06/06/2021   Bacterial endocarditis    Chronic HFrEF (heart failure with reduced ejection fraction) ([place]) 06/09/2021   Community acquired pneumonia due to Pneumococcus ([place]) 04/01/2021   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[place])    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 ([place])    Pneumococcal bacteremia 06/09/2021   Pneumothorax    Left lung spontaneous pneumothorax at age 33 yr    Past Surgical History:  Procedure Laterality Date   chest tubes Left    IR GASTROSTOMY TUBE MOD SED  06/18/2021   LAPAROSCOPIC INSERTION GASTROSTOMY TUBE N/A 06/20/2021   Procedure: LAPAROSCOPIC INSERTION GASTROSTOMY TUBE;  Surgeon: Clovis Riley, MD;  Location: [place];  Service: General;  Laterality: N/A;  DOW PATIENT   TRACHEOSTOMY TUBE PLACEMENT N/A 06/09/2021   Procedure: AWAKE TRACHEOSTOMY;  Surgeon: Melida Quitter, MD;  Location: [place];  Service: ENT;  Laterality: N/A;   Patient Active Problem List   Diagnosis Date Noted   Dermatitis associated with moisture 10/16/2021   Back pain 08/16/2021   Candidal endocarditis    History of radiation to head and neck region 07/11/2021   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   Dysgeusia 07/11/2021   S/P percutaneous endoscopic gastrostomy (PEG) tube placement ([place]) 06/24/2021   Pressure  injury of skin 06/17/2021   Tracheostomy status ([place])    Heart failure with reduced ejection fraction ([place]) 04/28/2021   Elevated troponin 04/01/2021   Elevated transaminase level 04/01/2021   Weight loss, unintentional 01/15/2021   Protein-calorie malnutrition, severe 01/10/2021   AKI (acute kidney injury) ([place]) 01/08/2021   Chemotherapy induced nausea and vomiting 01/01/2021   Goals of care, counseling/discussion 12/13/2020   Laryngeal cancer ([place]) 12/04/2020   Teeth missing 12/04/2020   [place] 12/04/2020   Retained tooth root 12/04/2020   Chronic apical periodontitis 12/04/2020   Accretions on teeth 12/04/2020    ONSET DATE: 2022   REFERRING DIAG: C32.9 (ICD-10-CM) - Laryngeal cancer  THERAPY DIAG:  Dysphagia, unspecified type  Hoarseness of voice  Rationale for Evaluation and Treatment Rehabilitation  SUBJECTIVE:   SUBJECTIVE STATEMENT: "Now I got this damn trach thing." Pt accompanied by: self  PERTINENT HISTORY: Pt seen for course of ST with limited/poor compliance in the past. 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  This condition required placement of tracheostomy and gastrostomy tubes.  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  They were discharged from [facility] on hospital day 77 (08-22-21) in Stable condition. Prior rad tx to larynx and bil neck completed on 02-08-21. Now with possible recurrent disease. Consult 10-17-21 with Dr. Denman George at [facility], with  results/decisions pending. PET scheduled 10-25-21 and biopsy on 11-08-21 at [facility].   LIVING ENVIRONMENT: Lives with: lives alone Lives in: House/apartment  PLOF:  Level of assistance: Independent with ADLs Employment: Other: does not work   PATIENT GOALS  Improve swallow  OBJECTIVE:   DIAGNOSTIC FINDINGS: PET scheduled for 10-25-21 at  [facility].  RECOMMENDATIONS FROM OBJECTIVE SWALLOW STUDY (MBSS/FEES) 07/25/21:   Clinical Impression Patient presents with mild improvements in swallow function as compared to previous MBS on 5/26 however at that time he also had Cortrak feeding tube impacting swallow function. Main improvements noted during today's MBS are reduced pharyngeal residuals, improved consistency and effectiveness of using swallow strategies of head turn right during swallow and cough/throat clear and reswallow and decreased intensity of discomfort from penetration/aspiration. Patient independently used both strategies and this was effective to clear all observed penetrates which occured with nectar thick and thin liquid barium and cleared majority of aspirate. He did exhibit one instance of trace silent aspiration of thin liquid barium during this study. When SLP asked patient is comfort level when swallowing thin liquid barium with added requirement of swallow strategies, he candidly replied, "I've been doing it already" and that when he has a liquid "like a regular coke" he has to be more careful with his swallowing. SLP is recommending to liberalize patient's diet to allow thin liquids at this time as he is demonstrating consistent and effective use of learned swallow strategies to reduce his aspiration risk. SLP will follow briefly to ensure education complete and patient comfortable with this change. Swallow Evaluation Recommendations  SLP Diet Recommendations: Regular solids;Thin liquid  Liquid Administration via: Cup  Medication Administration: Whole meds with puree  Supervision: Patient able to self feed  Compensations: Slow rate;Small sips/bites;Other (Comment);Clear throat after each swallow;Hard cough after swallow (head turn right during swallows with liquids)  Postural Changes: Seated upright at 90 degrees;Remain semi-upright after after feeds/meals (Comment) (30 minutes)  Oral Care Recommendations: Oral care  BID;Patient independent with oral care  Other Recommendations: Have oral suction available             COGNITION: Overall cognitive status: Within functional limits for tasks assessed Areas of impairment:  Behavior: Poor frustration tolerance Functional deficits: Pt refused >2 minutes of practice with aspiration compensations from MBS dated 07-25-21. He was only approx 80% accurate with 3 compensations/precautions at that time. SLP reiterated the rationale for the precautions and strongly encouraged pt to look at handout and complete precautions with his POs.   ORAL MOTOR EXAMINATION Overall status: Impaired:   Lingual: Left (ROM) Comments: mild decr in ROM SLP palpated pt's neck - fibrotic tissue rt lateral neck below ear, and some fibrosis in pt's submental musculature.  CLINICAL SWALLOW ASSESSMENT:   Current diet: regular and thin liquids Dentition: missing dentition Patient directly observed with POs: Yes: dysphagia 3 (soft) and thin liquids  Feeding: able to feed self Liquids provided by: cup Oral phase signs and symptoms:  none noted Pharyngeal phase signs and symptoms: delayed throat clear Pt did not follow any precautions from June MBS.    PATIENT REPORTED OUTCOME MEASURES (PROM): EAT-10: next session   TODAY'S TREATMENT:  10/23/21: Because he did not follow precautions with POs, SLP reiterated rationale for these precautions. Pt stated that at one time he had been told some aspiration is considered WNL. SLP told pt that in his condition no aspiration is optimal and strongly recommended pt adhere to precautions to reduce/eliminate aspiration risk.  SLP continued by assisting  pt in adhering to aspiration precautions - after 2 minutes pt refused to continue with practicing these compensations. At time of refusal he was following the precautions with approx 80% success.  SLP then reviewed pt's HEP with him and he req'd min A consistently. By end of session he was completing HEP  with mod I.  PATIENT EDUCATION: Education details: See "today's treatment" Person educated: Patient Education method: Explanation, Demonstration, Verbal cues, and Handouts Education comprehension: verbalized understanding, returned demonstration, verbal cues required, and needs further education   ASSESSMENT:  CLINICAL IMPRESSION: Patient is a 46 y.o. male who was seen today for re-evaluation of swallowing after MBS on 07-25-21. He did not follow any swallow precautions and req'd consistent min A with HEP procedure.   OBJECTIVE IMPAIRMENTS include voice disorder and dysphagia. These impairments are limiting patient from safety when swallowing. Factors affecting potential to achieve goals and functional outcome are cooperation/participation level, medical prognosis, previous level of function, and severity of impairments. Patient will benefit from skilled SLP services to address above impairments and improve overall function.  REHAB POTENTIAL: Fair given above factors in "objective impairments".   GOALS: Goals reviewed with patient? Yes  SHORT TERM GOALS: Target date: 11/20/2021    Pt will complete HEP with rare min A Baseline: consistent min A Goal status: INITIAL  2.    Pt will follow swallow precautions with rare min A in 2 sessions Baseline: Total A Goal status: INITIAL   LONG TERM GOALS: Target date: 12/18/2021    Pt will complete HEP with modified independence in 2 sessions Baseline: Consistent min A Goal status: INITIAL  2.  Pt will follow swallow precautions with modified independence in 2 sessions Baseline: Total A Goal status: INITIAL  3.  Pt will tell SLP three overt s/sx aspiration PNA Baseline: not provided yet Goal status: INITIAL  PLAN: SLP FREQUENCY: 2x/month  SLP DURATION: 8 weeks  PLANNED INTERVENTIONS: Aspiration precaution training, Pharyngeal strengthening exercises, Diet toleration management , Environmental controls, Trials of upgraded  texture/liquids, SLP instruction and feedback, Compensatory strategies, and Patient/family education    [facility], [place] 10/23/2021, 2:03 PM

## 2021-10-23 NOTE — Progress Notes (Signed)
[place]  [phone] [phone]   Name: David [name] Date: 10/23/2021 MRN: [id number]  DOB: 03/10/1975  Patient Care Team: Delene Ruffini, MD as PCP - General (Internal Medicine) [name], [name], RN as Oncology Nurse Navigator [name], [name], DO as Consulting Physician (Otolaryngology) [name], MD as Consulting Physician (Radiation Oncology) [name], MD as Consulting Physician (Hematology and Oncology) Pickenpack-Cousar, Carlena Sax, NP as Nurse Practitioner (Nurse Practitioner)    REASON FOR CONSULTATION: David [name] is a 46 y.o. 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 He was recently hospitalized for several months at [facility] where he received treatment for fungal endocarditis and required emergent tracheostomy and PEG tube. During hospitalization CT of chest showed concerns for residual vs recurrent tumor. Palliative requested to re-engage for ongoing support and goals of care.    SOCIAL HISTORY:     reports that he has been smoking cigarettes. He has been smoking an average of 3 packs per day. He has never used smokeless tobacco. He reports that he does not currently use alcohol after a past usage of about 12.0 standard drinks of alcohol per week. He reports that he does not currently use drugs after having used the following drugs: IV.  ADVANCE DIRECTIVES:  Patient reports he does not have advanced directives.  Would like to further discuss in the future.  CODE STATUS: Full code  PAST MEDICAL HISTORY: Past Medical History:  Diagnosis Date   Acute respiratory failure with hypoxia (HCC)    Aspiration pneumonia of right lower lobe (HCC) 06/06/2021    Bacterial endocarditis    Chronic HFrEF (heart failure with reduced ejection fraction) ([place]) 06/09/2021   Community acquired pneumonia due to Pneumococcus ([place]) 04/01/2021   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[place])    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 ([place])    Pneumococcal bacteremia 06/09/2021   Pneumothorax    Left lung spontaneous pneumothorax at age 81 yr     PAST SURGICAL HISTORY:  Past Surgical History:  Procedure Laterality Date   chest tubes Left    IR GASTROSTOMY TUBE MOD SED  06/18/2021   LAPAROSCOPIC INSERTION GASTROSTOMY TUBE N/A 06/20/2021   Procedure: LAPAROSCOPIC INSERTION GASTROSTOMY TUBE;  Surgeon: Clovis Riley, MD;  Location: [place];  Service: General;  Laterality: N/A;  DOW PATIENT   TRACHEOSTOMY TUBE PLACEMENT N/A 06/09/2021   Procedure: AWAKE TRACHEOSTOMY;  Surgeon: Melida Quitter, MD;  Location: [place];  Service: ENT;  Laterality: N/A;      ALLERGIES:  has No Known Allergies.  MEDICATIONS:  Current Outpatient Medications  Medication Sig Dispense Refill   chlorhexidine gluconate, MEDLINE KIT, (PERIDEX) 0.12 % solution Rinse  mouth with 15 mLs 2 (two) times daily. Do not eat or drink for 30 minutes. 473 mL 0   diclofenac Sodium (VOLTAREN) 1 % GEL Apply 2 g topically 4 (four) times daily as needed (pain). 100 g 2   DULoxetine (CYMBALTA) 60 MG capsule Take 1 capsule (60 mg total) by mouth daily. 90 capsule 2   fluconazole (DIFLUCAN) 200 MG tablet Take 4 tablets (800 mg total) by mouth daily. 706 tablet 3   folic acid (FOLVITE) 1 MG tablet Take 1 tablet (1 mg total) by mouth daily. 90 tablet 2   guaiFENesin (MUCINEX) 600 MG 12 hr tablet Take 2 tablets (1,200  mg total) by mouth 2 (two) times daily. (Patient taking differently: Take 1,200 mg by mouth 2 (two) times daily as needed for cough or to loosen phlegm.) 60 tablet 1   hydrOXYzine (ATARAX) 25 MG tablet Place 4 tablets (100 mg total) into feeding tube 3 (three) times daily as needed for anxiety. 60 tablet 5   lidocaine  (LIDODERM) 5 % Place 1 patch onto the skin daily. Remove & Discard patch within 12 hours or as directed by MD 30 patch 2   lidocaine (XYLOCAINE) 2 % solution Patient: Mix 1part 2% viscous lidocaine, 1part H20. Swish and spit 31m of diluted mixture up to eight times a day, prn soreness 200 mL 5   melatonin 3 MG TABS tablet Take 1 tablet (3 mg total) by mouth at bedtime as needed. 90 tablet 2   methadone (DOLOPHINE) 10 MG/ML solution Take 6 mLs (60 mg total) by mouth daily. (Patient taking differently: Take 50 mg by mouth daily.) 90 mL 0   Multiple Vitamin (MULTIVITAMIN WITH MINERALS) TABS tablet Place 1 tablet into feeding tube daily. 90 tablet 2   nicotine (NICODERM CQ - DOSED IN MG/24 HOURS) 14 mg/24hr patch Place 1 patch (14 mg total) onto the skin daily. Apply 21 mg patch daily x 6 wk, then 131mpatch daily x 2 wk, then 7 mg patch daily x 2 wk (Patient not taking: Reported on 06/06/2021) 14 patch 0   ondansetron (ZOFRAN) 8 MG tablet Take 1 tablet (8 mg total) by mouth every 8 (eight) hours as needed for nausea or vomiting. Starting day 3 after chemotherapy 30 tablet 2   pantoprazole (PROTONIX) 40 MG tablet Take 1 tablet (40 mg total) by mouth daily. 90 tablet 3   phenol (CHLORASEPTIC) 1.4 % LIQD Use as directed 1 spray in the mouth or throat as needed for throat irritation / pain. 177 mL 5   polyethylene glycol powder (GLYCOLAX/MIRALAX) 17 GM/SCOOP powder Place 17 g in water and drink daily. 238 g 3   Pregabalin 20 MG/ML SOLN 20 mg by PEG Tube route in the morning and at bedtime. 60 mL 1   sodium chloride 0.9 % nebulizer solution Take 3 mLs by nebulization every 8 (eight) hours as needed (Secretions). 90 mL 12   sodium chloride flush (NS) 0.9 % SOLN 10-40 mLs by Intracatheter route as needed (flush). 200 mL 5   thiamine (VITAMIN B1) 100 MG tablet Place 1 tablet (100 mg total) into feeding tube daily. 90 tablet 2   Zinc Oxide (TRIPLE PASTE) 12.8 % ointment Apply topically as needed for irritation.  Apply to trach breakdown site 227 g 2   No current facility-administered medications for this visit.    VITAL SIGNS: BP 105/79 (BP Location: Right Arm, Patient Position: Sitting)   Pulse 72   Temp 98 F (36.7 C) (Oral)   Resp 18   Ht 5' 11"  (1.803 m)   Wt 93 lb 3.2 oz (42.3 kg)   SpO2 100%   BMI 13.00 kg/m  Filed Weights   10/23/21 1501  Weight: 93 lb 3.2 oz (42.3 kg)     Estimated body mass index is 13 kg/m as calculated from the following:   Height as of this encounter: 5' 11"  (1.803 m).   Weight as of this encounter: 93 lb 3.2 oz (42.3 kg).  PERFORMANCE STATUS (ECOG) : 1 - Symptomatic but completely ambulatory  Physical Exam General: NAD, cachectic Cardiovascular: regular rate and rhythm Pulmonary: clear ant fields, normal breathing pattern,  tracheostomy midline, Passey muir valve  Abdomen: soft, nontender, + bowel sounds, PEG in place Extremities: no edema, no joint deformities Neurological: AAOx3, mood appropriate  IMPRESSION:  David [name] presents to the clinic today for follow-up post hospitalization. His mother, David [name] is also present. He was seen by the Dietician today.   He reports doing fairly well overall. Is anxious about future PET scan results and pending biopsy. He is considering having tracheostomy reversed however will not be able to make final decisions or have understanding of his options until he has a confirmed report if he has cancer or not. Emotional support provided.   He is doing much better at his apartment and adjusting. His mother continues to be a big support for him. We reviewed upcoming appointments that he has scheduled and copy of calendar provided as requested.   Pain He is being followed at the methadone clinic ([facility]). Endorses occasional pain or discomfort in his throat area and upper back. He is tolerating lidocaine patches and Lyrica as prescribed. Feels this is helping.   I will continue to closely monitor and adjust as  needed.    We discussed his recent illness and future plans. [name] and his mother are clear in expressed goals to continue to treat the treatable. He is planning on proceeding with any suggested treatments and interventions.  He is remaining hopeful and expresses appreciation of ongoing support to allow him every opportunity to thrive.      PLAN:  Ongoing goals of care discussions Patient and mother clear in expressed wishes to continue to treat the treatable aggressively allowing him every opportunity to continue to thrive.  Lidocaine patch to back  Pregabalin 20 mg twice daily via peg  I will plan to see patient back in the clinic in 4-6 weeks in collaboration with his other oncology appointments.   Patient and mother expressed understanding and was in agreement with this plan. He also understands that He can call the clinic at any time with any questions, concerns, or complaints.      Any controlled substances utilized were prescribed in the context of palliative care. PDMP has been reviewed.    Time Total: 40 min   Visit consisted of counseling and education dealing with the complex and emotionally intense issues of symptom management and palliative care in the setting of serious and potentially life-threatening illness.Greater than 50%  of this time was spent counseling and coordinating care related to the above assessment and plan.  Alda Lea, AGPCNP-BC  Palliative Medicine Team/[place] [place]

## 2021-10-23 NOTE — Patient Instructions (Signed)
   WHEN YOU EAT AND DRINK: EVERY bite or sip: ~Turn head to right ~Swallow then cough ~Swallow again   EXERCISES: Swallow hard Hold your tongue out and swallow Swallow and hold it tight 5 sec  ~do each of them as many times as you can, 3-4 times a day

## 2021-10-23 NOTE — Therapy (Signed)
[facility] [place], [place], [place], [address] Phone: [phone]   Fax:  [phone]  Patient Details  Name: David [name] MRN: [id number] Date of Birth: 1975-09-16 Referring Provider:  [name], MD  SPEECH THERAPY DISCHARGE SUMMARY  Visits from Start of Care: 5  Current functional level related to goals / functional outcomes: See below  Encounter Date: 10/23/2021 SLP SHORT TERM GOAL #1      Title pt will complete HEP with occasional min A    Time      Period --   vists, for all STGs    Status Met           SLP SHORT TERM GOAL #2    Title pt will tell SLP why pt is completing HEP with modified independence    Time      Status Met           SLP SHORT TERM GOAL #3    Title pt will describe 3 overt s/s aspiration PNA with modified independence    Time      Status Met           SLP SHORT TERM GOAL #4    Title pt will tell SLP how a food journal could hasten return to a more normalized diet    Time      Status deferred                     Long Term Goals 05/07/21                 SLP LONG TERM GOAL #1    Title pt will complete HEP with modified independence over two visits    Time 2   04-04-21    Period --   visits, for all LTGs    Status Not met and Ongoing  (renewed 05-07-21)           SLP LONG TERM GOAL #2    Title pt will describe how to modify HEP over time, and the timeline associated with reduction in HEP frequency with modified independence over two sessions    Time 2    Status Not met and Ongoing (renewed 05-07-21)       Plan - 05/07/21      Clinical Impression Statement At this time pt swallowing is deemed [facility] with diet/thin liquids as delineated in MBSS from last week. However due to lingual soreness SLP suggested dys I-II for pt today. SLP reviewed Pt MBSS.  See daily note above for more details. Data indicate that pt's swallow ability could very well decline over time following  conclusion of their radiation therapy due to muscle disuse atrophy and/or muscle fibrosis. Pt will cont to need to be seen by SLP in order to assess safety of PO intake, assess the need for recommending any objective swallow assessment, and ensuring pt correctly completes the individualized HEP.    Speech Therapy Frequency --   once approx every four weeks    Duration --   90 days/3 therapy sessions for this reporting period;  Overall plan of care to include approx 7 visits    Treatment/Interventions Aspiration precaution training;Pharyngeal strengthening exercises;Diet toleration management by SLP;Trials of upgraded texture/liquids;Patient/family education;SLP instruction and feedback;Compensatory techniques    Potential to Achieve Goals Good; decr'd participation/lack of cooperation    Remaining deficits: Dysphagia.   Education / Equipment: HEP, s/s asp PNA, compensations for safe swallowing  Patient agrees to discharge. Patient goals were partially met. Patient is being discharged due to not returning since the last visit.  [place], [place] 10/23/2021, 2:19 PM  [facility] [place] [address] [place], [place], [address] Phone: [phone]   Fax:  [phone]

## 2021-10-23 NOTE — Addendum Note (Signed)
Addended by: Garald Balding B on: 10/23/2021 02:17 PM   Modules accepted: Orders

## 2021-10-24 ENCOUNTER — Encounter: Payer: Self-pay | Admitting: General Practice

## 2021-10-24 NOTE — Progress Notes (Signed)
[place] Spiritual Care Note  Followed up with David [name] by phone per referral from [facility] team. His throat was extra dry and bothersome today, so he plans to phone at a better time and has direct Spiritual Care number.   [place], [place] Pager [phone] Voicemail [phone]

## 2021-10-25 ENCOUNTER — Ambulatory Visit (HOSPITAL_COMMUNITY): Payer: Medicaid Other

## 2021-11-02 ENCOUNTER — Inpatient Hospital Stay (HOSPITAL_COMMUNITY)
Admission: RE | Admit: 2021-11-02 | Discharge: 2021-11-02 | Disposition: A | Discharge status: Home / Self Care | Payer: Medicaid Other | Source: Ambulatory Visit

## 2021-11-02 DIAGNOSIS — Z930 Tracheostomy status: Secondary | ICD-10-CM

## 2021-11-02 DIAGNOSIS — C329 Malignant neoplasm of larynx, unspecified: Secondary | ICD-10-CM | POA: Diagnosis present

## 2021-11-05 ENCOUNTER — Telehealth: Payer: Self-pay

## 2021-11-05 NOTE — Telephone Encounter (Signed)
Spoke with [name] (Mother) regarding prior authorization initiation for Patient's Lidocaine 5% Patches via phone to [employer]. Review number [id number]. Intake ID # [id number]. No other needs or concerns noted at this time.

## 2021-11-07 ENCOUNTER — Ambulatory Visit (INDEPENDENT_AMBULATORY_CARE_PROVIDER_SITE_OTHER): Payer: Medicaid Other | Admitting: Internal Medicine

## 2021-11-07 ENCOUNTER — Other Ambulatory Visit: Payer: Self-pay

## 2021-11-07 ENCOUNTER — Encounter: Payer: Self-pay | Admitting: Internal Medicine

## 2021-11-07 VITALS — BP 115/81 | HR 80 | Temp 98.3°F | Ht 69.0 in | Wt 93.0 lb

## 2021-11-07 DIAGNOSIS — B377 Candidal sepsis: Secondary | ICD-10-CM

## 2021-11-07 DIAGNOSIS — I330 Acute and subacute infective endocarditis: Secondary | ICD-10-CM | POA: Diagnosis present

## 2021-11-07 NOTE — Patient Instructions (Signed)
Let's check blood tests today   Continue fluconazole 800 mg for the next 3 months   Please check with your cardiologist or primary care to do ekg at least once a year. We want to make sure your rhythm is ok

## 2021-11-07 NOTE — Progress Notes (Signed)
[place] for Infectious Disease  Patient Active Problem List   Diagnosis Date Noted   Dermatitis associated with moisture 10/16/2021   Back pain 08/16/2021   Candidal endocarditis    History of radiation to head and neck region 07/11/2021   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   Dysgeusia 07/11/2021   S/P percutaneous endoscopic gastrostomy (PEG) tube placement ([place]) 06/24/2021   Pressure injury of skin 06/17/2021   Tracheostomy status ([place])    Heart failure with reduced ejection fraction ([place]) 04/28/2021   Elevated troponin 04/01/2021   Elevated transaminase level 04/01/2021   Weight loss, unintentional 01/15/2021   Protein-calorie malnutrition, severe 01/10/2021   AKI (acute kidney injury) ([place]) 01/08/2021   Chemotherapy induced nausea and vomiting 01/01/2021   Goals of care, counseling/discussion 12/13/2020   Laryngeal cancer ([place]) 12/04/2020   Teeth missing 12/04/2020   Caries 12/04/2020   Retained tooth root 12/04/2020   Chronic apical periodontitis 12/04/2020   Accretions on teeth 12/04/2020      Subjective:    Patient ID: David [name], male    DOB: 1975-11-27, 46 y.o.   MRN: [id number]  Chief Complaint  Patient presents with   Follow-up    HPI:  David [name] is a 46 y.o. 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 Missed dose yesterday as out of med No side effect No fever, chill, rash  He is in in active f/u with [facility] ent for what could possibly be recurrent head/neck cancer. He has been in remission 3-4 months  He is being followed by palliative care team and is asking me if I can do with his chronic back pain/trach site pain  ----------------- 11/07/21 id clinic  f/u Patient feeling well He has recent pet scan [facility] and this demonstrate some concerning lesion in his throat and this awaits biopsy. He takes 800 mg fluconazole daily and tolerates it well Again at this time no port/central catheter  No hair loss, n/v, rash, ruq pain, jaundice     No Known Allergies    Outpatient Medications Prior to Visit  Medication Sig Dispense Refill   chlorhexidine gluconate, MEDLINE KIT, (PERIDEX) 0.12 % solution Rinse  mouth with 15 mLs 2 (two) times daily. Do not eat or drink for 30 minutes. 473 mL 0   diclofenac Sodium (VOLTAREN) 1 % GEL Apply 2 g topically 4 (four) times daily as needed (pain). 100 g 2   DULoxetine (CYMBALTA) 60 MG capsule Take 1 capsule (60 mg total) by mouth daily. 90 capsule 2   fluconazole (DIFLUCAN) 200 MG tablet Take 4 tablets (800 mg total) by mouth daily. 409 tablet 3   folic acid (FOLVITE) 1 MG tablet Take 1 tablet (1 mg total) by mouth daily. 90 tablet 2   guaiFENesin (MUCINEX) 600 MG 12 hr tablet Take 2 tablets (1,200 mg total) by mouth 2 (two) times daily. (Patient taking differently: Take 1,200 mg by mouth 2 (two) times daily as needed for cough or to loosen phlegm.) 60 tablet 1   hydrOXYzine (ATARAX) 25 MG tablet Place 4 tablets (100 mg total) into feeding tube 3 (three) times daily as needed for anxiety. 60 tablet 5   lidocaine (LIDODERM) 5 % Place 1 patch onto the skin  daily. Remove & Discard patch within 12 hours or as directed by MD 30 patch 2   lidocaine (XYLOCAINE) 2 % solution Patient: Mix 1part 2% viscous lidocaine, 1part H20. Swish and spit 41m of diluted mixture up to eight times a day, prn soreness 200 mL 5   melatonin 3 MG TABS tablet Take 1 tablet (3 mg total) by mouth at bedtime as needed. 90 tablet 2   methadone (DOLOPHINE) 10 MG/ML solution Take 6 mLs (60 mg total) by mouth daily. (Patient taking differently: Take 50 mg by mouth daily.) 90 mL 0   Multiple Vitamin (MULTIVITAMIN WITH MINERALS) TABS tablet  Place 1 tablet into feeding tube daily. 90 tablet 2   nicotine (NICODERM CQ - DOSED IN MG/24 HOURS) 14 mg/24hr patch Place 1 patch (14 mg total) onto the skin daily. Apply 21 mg patch daily x 6 wk, then 171mpatch daily x 2 wk, then 7 mg patch daily x 2 wk (Patient not taking: Reported on 06/06/2021) 14 patch 0   ondansetron (ZOFRAN) 8 MG tablet Take 1 tablet (8 mg total) by mouth every 8 (eight) hours as needed for nausea or vomiting. Starting day 3 after chemotherapy 30 tablet 2   pantoprazole (PROTONIX) 40 MG tablet Take 1 tablet (40 mg total) by mouth daily. 90 tablet 3   phenol (CHLORASEPTIC) 1.4 % LIQD Use as directed 1 spray in the mouth or throat as needed for throat irritation / pain. 177 mL 5   polyethylene glycol powder (GLYCOLAX/MIRALAX) 17 GM/SCOOP powder Place 17 g in water and drink daily. 238 g 3   Pregabalin 20 MG/ML SOLN 20 mg by PEG Tube route in the morning and at bedtime. 60 mL 1   sodium chloride 0.9 % nebulizer solution Take 3 mLs by nebulization every 8 (eight) hours as needed (Secretions). 90 mL 12   sodium chloride flush (NS) 0.9 % SOLN 10-40 mLs by Intracatheter route as needed (flush). 200 mL 5   thiamine (VITAMIN B1) 100 MG tablet Place 1 tablet (100 mg total) into feeding tube daily. 90 tablet 2   Zinc Oxide (TRIPLE PASTE) 12.8 % ointment Apply topically as needed for irritation. Apply to trach breakdown site 227 g 2   No facility-administered medications prior to visit.     Social History   Socioeconomic History   Marital status: Divorced    Spouse name: Not on file   Number of children: Not on file   Years of education: Not on file   Highest education level: Not on file  Occupational History   Not on file  Tobacco Use   Smoking status: Every Day    Packs/day: 3.00    Types: Cigarettes   Smokeless tobacco: Never  Vaping Use   Vaping Use: Never used  Substance and Sexual Activity   Alcohol use: Not Currently    Alcohol/week: 12.0 standard drinks of  alcohol    Types: 12 Cans of beer per week   Drug use: Not Currently    Types: IV    Comment: 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)   Sexual activity: Not on file  Other Topics Concern   Not on file  Social History Narrative   Not on file   Social Determinants of Health   Financial Resource Strain: Not on file  Food Insecurity: Not on file  Transportation Needs: Not on file  Physical Activity: Not on file  Stress: Not on file  Social Connections: Not on file  Intimate Partner Violence: Not on file      Review of Systems    All other ros negative  Objective:    BP 115/81   Pulse 80   Temp 98.3 F (36.8 C) (Temporal)   Ht 5' 9"  (1.753 m)   Wt 93 lb (42.2 kg)   SpO2 100%   BMI 13.73 kg/m  Nursing note and vital signs reviewed.  Physical Exam     General/constitutional: no distress, pleasant HEENT: Normocephalic, PER, Conj Clear, EOMI, Oropharynx clear Neck peg tube dressing c/d CV: rrr no mrg Lungs: clear to auscultation, normal respiratory effort Abd: Soft, Nontender Ext: no edema Skin: no rash Neuro: nonfocal MSK: no peripheral joint swelling/tenderness/warmth; back spines nontender    Labs: Lab Results  Component Value Date   WBC 6.1 09/26/2021   HGB 10.1 (L) 09/26/2021   HCT 31.6 (L) 09/26/2021   MCV 89.3 09/26/2021   PLT 220 20/23/3435   Last metabolic panel Lab Results  Component Value Date   GLUCOSE 107 (H) 09/26/2021   NA 135 09/26/2021   K 3.8 09/26/2021   CL 100 09/26/2021   CO2 25 09/26/2021   BUN 13 09/26/2021   CREATININE 0.68 09/26/2021   GFRNONAA >60 08/16/2021   CALCIUM 9.3 09/26/2021   PHOS 4.2 06/25/2021   PROT 7.6 09/26/2021   ALBUMIN 2.6 (L) 08/13/2021   BILITOT 0.3 09/26/2021   ALKPHOS 93 08/13/2021   AST 37 09/26/2021   ALT 28 09/26/2021   ANIONGAP 9 08/16/2021    Micro:  Serology:  Imaging:  Assessment & Plan:   Problem List Items Addressed This Visit   None Visit Diagnoses      Infective endocarditis, due to unspecified organism, unspecified chronicity    -  Primary   Relevant Orders   CBC   COMPLETE METABOLIC PANEL WITH GFR   Candidemia (HCC)       Relevant Orders   CBC   COMPLETE METABOLIC PANEL WITH GFR        No orders of the defined types were placed in this encounter.    Fungal tv endocarditis Candida albican Will continue his fluconazole high dose 800 mg for now. Potentially transition to lower dose at another 3-6 months No side effect at this time clinically  Labs today Liquid form fluconazole ordered for a year F/u 4-6 weeks    Ent cancer F/u [facility]  ----- 11/07/21 id assessment Doing well Pending repeat upper oral airway biopsy for pet avid lesion  Would continue high dose 12 mg/kg fluconazole for another 3 months then go to 6 mg/kg Advise patient of periodic once or twice a year ekg and discuss potential drug interaction that could make this worse   Follow-up: Return in about 3 months (around 02/07/2022).      Jabier Mutton, [place] for Infectious Disease [facility] 11/07/2021, 3:33 PM

## 2021-11-08 ENCOUNTER — Ambulatory Visit: Payer: Medicaid Other

## 2021-11-08 LAB — COMPLETE METABOLIC PANEL WITH GFR
AG Ratio: 0.9 (calc) — ABNORMAL LOW (ref 1.0–2.5)
ALT: 18 U/L (ref 9–46)
AST: 29 U/L (ref 10–40)
Albumin: 3.5 g/dL — ABNORMAL LOW (ref 3.6–5.1)
Alkaline phosphatase (APISO): 62 U/L (ref 36–130)
BUN: 12 mg/dL (ref 7–25)
CO2: 28 mmol/L (ref 20–32)
Calcium: 8.9 mg/dL (ref 8.6–10.3)
Chloride: 105 mmol/L (ref 98–110)
Creat: 0.62 mg/dL (ref 0.60–1.29)
Globulin: 3.9 g/dL (calc) — ABNORMAL HIGH (ref 1.9–3.7)
Glucose, Bld: 104 mg/dL — ABNORMAL HIGH (ref 65–99)
Potassium: 3.7 mmol/L (ref 3.5–5.3)
Sodium: 139 mmol/L (ref 135–146)
Total Bilirubin: 0.2 mg/dL (ref 0.2–1.2)
Total Protein: 7.4 g/dL (ref 6.1–8.1)
eGFR: 119 mL/min/{1.73_m2} (ref >=60)

## 2021-11-08 LAB — CBC
HCT: 36.3 % — ABNORMAL LOW (ref 38.5–50.0)
Hemoglobin: 11.6 g/dL — ABNORMAL LOW (ref 13.2–17.1)
MCH: 29.1 pg (ref 27.0–33.0)
MCHC: 32 g/dL (ref 32.0–36.0)
MCV: 91 fL (ref 80.0–100.0)
MPV: 10.7 fL (ref 7.5–12.5)
Platelets: 241 10*3/uL (ref 140–400)
RBC: 3.99 10*6/uL — ABNORMAL LOW (ref 4.20–5.80)
RDW: 14.7 % (ref 11.0–15.0)
WBC: 4.6 10*3/uL (ref 3.8–10.8)

## 2021-11-12 ENCOUNTER — Ambulatory Visit (HOSPITAL_COMMUNITY): Payer: Medicaid Other

## 2021-11-14 ENCOUNTER — Ambulatory Visit: Payer: Self-pay | Admitting: Radiation Oncology

## 2021-11-21 ENCOUNTER — Ambulatory Visit (HOSPITAL_COMMUNITY)
Admission: RE | Admit: 2021-11-21 | Discharge: 2021-11-21 | Disposition: A | Discharge status: Home / Self Care | Payer: Medicaid Other | Source: Ambulatory Visit | Attending: Acute Care | Admitting: Acute Care

## 2021-11-21 DIAGNOSIS — C329 Malignant neoplasm of larynx, unspecified: Secondary | ICD-10-CM | POA: Diagnosis present

## 2021-11-21 DIAGNOSIS — Z720 Tobacco use: Secondary | ICD-10-CM | POA: Insufficient documentation

## 2021-11-21 DIAGNOSIS — R490 Dysphonia: Secondary | ICD-10-CM | POA: Diagnosis present

## 2021-11-21 DIAGNOSIS — Z930 Tracheostomy status: Secondary | ICD-10-CM | POA: Diagnosis not present

## 2021-11-21 NOTE — Progress Notes (Signed)
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 He completed XRT 02/09/21. Recently been referred to [facility] from local ENT for persistent/residual disease in his neck and larynx. ENT from [facility]: evaluated 10/12. Found globally edematous Larynx, tissue was biopsed  Presents to trach clinic for planned trach change  Review of Systems  Constitutional:  Negative for fever and weight loss.  HENT: Negative.    Eyes: Negative.   Respiratory: Negative.    Cardiovascular: Negative.   Gastrointestinal: Negative.   Genitourinary: Negative.   Skin:  Positive for rash.       Painful rash around stoma     Exam   General chronically ill appearing 48 yom. Ambulated into clinic. No distress HENT temporal wasting has improved. Voice phonation still raspy w/ sig rush of air after PMV removal  Pulm scattered rhonchi Card rrr Abd soft  Ext warm  Neuro intact   Procedure: Trach removed. Site inspected. Red and painful around the trach stoma still. This appears like residual radiation injury.   Active Problems:   Tracheostomy status ([place])   Laryngeal cancer ([place])  Phonation disorder  Tracheostomy status ([place]) Overview: Brand: shiley Size: 4 cuffless  Last change in clinic: 10/25  Discussion. He tells me that onc from [facility] says he is "cancer free". I see note but nothing to confirm that. From trach stand-point he is stable. He still have very poor phonation quality and evidence of Subglottic stenosis when I occlude his trach as evidenced by fairly brisk rush of air. I worry even if there is not cancer he still has sig subglottic obstruction (? Edema scar tissue??) At this point he is not ready to decannulate and I would have to see sig clinical improvement before I think decannulation would be on the table.   Plan ROV 6 weeks trach change Starting capping trials but only as PRN and alternating w/ PMV. I suspect he will be using PMV  mostly still. I again reminded him of the importance of removing PMV at night      Continuous tobacco abuse Overview: I spoke to him about the importance of stopping and the increased risk of recurrent malignancy w/ on-going smoking.        My time 20 min   Erick Colace ACNP-BC [place] Pager # [phone] OR # [phone] if no answer

## 2021-11-21 NOTE — Progress Notes (Signed)
Tracheostomy Procedure Note  David [name] [id number] Sep 16, 1975  Pre Procedure Tracheostomy Information  Trach Brand: Shiley Size:  4.0 [id number] Style: Uncuffed Secured by: Velcro   Procedure: Trach change and trach cleaning    Post Procedure Tracheostomy Information  Trach Brand: Shiley Size:  4.0 [id number] Style: Uncuffed Secured by: Velcro   Post Procedure Evaluation:  ETCO2 positive color change from yellow to purple : Yes.   Vital signs:VSS Patients current condition: stable Complications: No apparent complications Trach site exam: clean, dry Wound care done: 4 x 4 gauze drain  Patient did tolerate procedure well.   Education: Red cap trial and instructions  Prescription needs: no    Additional needs: none

## 2021-11-22 ENCOUNTER — Inpatient Hospital Stay (HOSPITAL_BASED_OUTPATIENT_CLINIC_OR_DEPARTMENT_OTHER): Payer: Medicaid Other | Admitting: Nurse Practitioner

## 2021-11-22 ENCOUNTER — Inpatient Hospital Stay: Payer: Medicaid Other | Attending: Radiation Oncology | Admitting: Hematology and Oncology

## 2021-11-22 ENCOUNTER — Other Ambulatory Visit: Payer: Self-pay

## 2021-11-22 ENCOUNTER — Ambulatory Visit: Payer: Medicaid Other | Attending: Radiation Oncology

## 2021-11-22 ENCOUNTER — Ambulatory Visit
Admission: RE | Admit: 2021-11-22 | Discharge: 2021-11-22 | Disposition: A | Discharge status: Home / Self Care | Payer: Medicaid Other | Source: Ambulatory Visit | Attending: Hematology and Oncology | Admitting: Hematology and Oncology

## 2021-11-22 ENCOUNTER — Encounter: Payer: Self-pay | Admitting: Nurse Practitioner

## 2021-11-22 DIAGNOSIS — Z515 Encounter for palliative care: Secondary | ICD-10-CM | POA: Diagnosis not present

## 2021-11-22 DIAGNOSIS — Z930 Tracheostomy status: Secondary | ICD-10-CM | POA: Diagnosis not present

## 2021-11-22 DIAGNOSIS — R490 Dysphonia: Secondary | ICD-10-CM | POA: Diagnosis present

## 2021-11-22 DIAGNOSIS — G893 Neoplasm related pain (acute) (chronic): Secondary | ICD-10-CM | POA: Diagnosis not present

## 2021-11-22 DIAGNOSIS — Z9221 Personal history of antineoplastic chemotherapy: Secondary | ICD-10-CM | POA: Insufficient documentation

## 2021-11-22 DIAGNOSIS — Z79899 Other long term (current) drug therapy: Secondary | ICD-10-CM | POA: Diagnosis not present

## 2021-11-22 DIAGNOSIS — C328 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: Secondary | ICD-10-CM | POA: Insufficient documentation

## 2021-11-22 DIAGNOSIS — R1310 Dysphagia, unspecified: Secondary | ICD-10-CM | POA: Insufficient documentation

## 2021-11-22 DIAGNOSIS — C329 Malignant neoplasm of larynx, unspecified: Secondary | ICD-10-CM

## 2021-11-22 DIAGNOSIS — R530 Neoplastic (malignant) related fatigue: Secondary | ICD-10-CM

## 2021-11-22 DIAGNOSIS — Z923 Personal history of irradiation: Secondary | ICD-10-CM | POA: Diagnosis not present

## 2021-11-22 MED ORDER — PREGABALIN 20 MG/ML PO SOLN: 20.0000 mg | Freq: Two times a day (BID) | ORAL | 1 refills | Status: DC

## 2021-11-22 NOTE — Therapy (Signed)
OUTPATIENT SPEECH LANGUAGE PATHOLOGY SWALLOW TREATMENT   Patient Name: David [name] MRN: [id number] DOB:[birth year], 46 y.o., male Today's Date: 11/22/2021  PCP: Delene Ruffini, MD REFERRING PROVIDER: [name], MD   End of Session - 11/22/21 1740     Visit Number 2    Number of Visits 4    Date for SLP Re-Evaluation 12/24/21    Authorization Type Medicaid [place] Access    SLP Start Time 1107    SLP Stop Time  1147    SLP Time Calculation (min) 40 min    Activity Tolerance Patient tolerated treatment well              Past Medical History:  Diagnosis Date   Acute respiratory failure with hypoxia (HCC)    Aspiration pneumonia of right lower lobe ([place]) 06/06/2021   Bacterial endocarditis    Chronic HFrEF (heart failure with reduced ejection fraction) ([place]) 06/09/2021   Community acquired pneumonia due to Pneumococcus ([place]) 04/01/2021   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[place])    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 ([place])    Pneumococcal bacteremia 06/09/2021   Pneumothorax    Left lung spontaneous pneumothorax at age 19 yr    Past Surgical History:  Procedure Laterality Date   chest tubes Left    IR GASTROSTOMY TUBE MOD SED  06/18/2021   LAPAROSCOPIC INSERTION GASTROSTOMY TUBE N/A 06/20/2021   Procedure: LAPAROSCOPIC INSERTION GASTROSTOMY TUBE;  Surgeon: Clovis Riley, MD;  Location: [facility];  Service: General;  Laterality: N/A;  DOW PATIENT   TRACHEOSTOMY TUBE PLACEMENT N/A 06/09/2021   Procedure: AWAKE TRACHEOSTOMY;  Surgeon: Melida Quitter, MD;  Location: [facility];  Service: ENT;  Laterality: N/A;   Patient Active Problem List   Diagnosis Date Noted   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   Continuous tobacco abuse 11/21/2021   Dermatitis associated with moisture 10/16/2021   Back pain 08/16/2021   Candidal endocarditis    History of radiation to head and neck region 07/11/2021   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   Dysgeusia 07/11/2021   S/P percutaneous  endoscopic gastrostomy (PEG) tube placement ([place]) 06/24/2021   Pressure injury of skin 06/17/2021   Tracheostomy status ([place])    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   Heart failure with reduced ejection fraction ([place]) 04/28/2021   Elevated troponin 04/01/2021   Elevated transaminase level 04/01/2021   Weight loss, unintentional 01/15/2021   Protein-calorie malnutrition, severe 01/10/2021   AKI (acute kidney injury) ([place]) 01/08/2021   Chemotherapy induced nausea and vomiting 01/01/2021   Goals of care, counseling/discussion 12/13/2020   Laryngeal cancer ([place]) 12/04/2020   Teeth missing 12/04/2020   Caries 12/04/2020   Retained tooth root 12/04/2020   Chronic apical periodontitis 12/04/2020   Accretions on teeth 12/04/2020    ONSET DATE: 2022   REFERRING DIAG: C32.9 (ICD-10-CM) - Laryngeal cancer  THERAPY DIAG:  Dysphagia, unspecified type  Hoarseness of voice  Rationale for Evaluation and Treatment Rehabilitation  SUBJECTIVE:   SUBJECTIVE STATEMENT: "I haven't been doing them (HEP)." Pt accompanied by: self  PERTINENT HISTORY: Pt seen for course of ST with limited/poor compliance in the past. 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  This condition required placement of tracheostomy and gastrostomy tubes.  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  They were discharged from [facility] on hospital day 77 (08-22-21) in Stable condition. Prior rad tx to larynx and bil neck completed  on 02-08-21. Now with possible recurrent disease. Consult 10-17-21 with Dr. Denman George at [facility], with results/decisions pending. PET scheduled 10-25-21 and biopsy on 11-08-21 at [facility].   LIVING ENVIRONMENT: Lives with: lives alone Lives in: House/apartment  PLOF:  Level of assistance: Independent with ADLs Employment: Other: does not  work   PATIENT GOALS  Improve swallow  OBJECTIVE:   DIAGNOSTIC FINDINGS: PET scheduled for 10-25-21 at [facility].  RECOMMENDATIONS FROM OBJECTIVE SWALLOW STUDY (MBSS/FEES) 07/25/21:   Clinical Impression Patient presents with mild improvements in swallow function as compared to previous MBS on 5/26 however at that time he also had Cortrak feeding tube impacting swallow function. Main improvements noted during today's MBS are reduced pharyngeal residuals, improved consistency and effectiveness of using swallow strategies of head turn right during swallow and cough/throat clear and reswallow and decreased intensity of discomfort from penetration/aspiration. Patient independently used both strategies and this was effective to clear all observed penetrates which occured with nectar thick and thin liquid barium and cleared majority of aspirate. He did exhibit one instance of trace silent aspiration of thin liquid barium during this study. When SLP asked patient is comfort level when swallowing thin liquid barium with added requirement of swallow strategies, he candidly replied, "I've been doing it already" and that when he has a liquid "like a regular coke" he has to be more careful with his swallowing. SLP is recommending to liberalize patient's diet to allow thin liquids at this time as he is demonstrating consistent and effective use of learned swallow strategies to reduce his aspiration risk. SLP will follow briefly to ensure education complete and patient comfortable with this change. Swallow Evaluation Recommendations  SLP Diet Recommendations: Regular solids;Thin liquid  Liquid Administration via: Cup  Medication Administration: Whole meds with puree  Supervision: Patient able to self feed  Compensations: Slow rate;Small sips/bites;Other (Comment);Clear throat after each swallow;Hard cough after swallow (head turn right during swallows with liquids)  Postural Changes: Seated upright at 90  degrees;Remain semi-upright after after feeds/meals (Comment) (30 minutes)  Oral Care Recommendations: Oral care BID;Patient independent with oral care  Other Recommendations: Have oral suction available               PATIENT REPORTED OUTCOME MEASURES (PROM): EAT-10: next session   TODAY'S TREATMENT:  11/22/21: Pt did not follow any precautions from [place] from June 2023. SLP reiterated precautions. Pt stated that he was having more difficulty with some Tylenol last week. [name] said it "stuck in my throat" and he had to use a liquid wash to clear. SLP worked with [name] today with applesauce and tic tacs, to teach [name] how to use applesauce with medication. Using this method, pharyngeal clearance was achieved. [name] said that sometimes he has difficulty with food remaining in his throat after the first two swallows. SLP encouraged him to use alternating bite and sip technique, and demonstrated this during the session. [name] demonstrated understanding. He demonstrated delayed through clear on one sip of 8 during the session. No overt signs and symptoms of aspiration pneumonia were present today.  [name] told SLP in "s" statement he had not been completing HEP and SLP reminded pt yet again rationale for completing HEP, highlighting that aspiration may cause aspiration PNA, using visual aid of sagittal view of oral and pharyngeal cavities. Pt demonstrated understanding of rationale for HEP. Despite not completing HEP, he completed it today with independence.  10/23/21: Because he did not follow precautions with POs, SLP reiterated rationale for these precautions. Pt  stated that at one time he had been told some aspiration is considered WNL. SLP told pt that in his condition no aspiration is optimal and strongly recommended pt adhere to precautions to reduce/eliminate aspiration risk.  SLP continued by assisting pt in adhering to aspiration precautions - after 2 minutes pt refused to continue with  practicing these compensations. At time of refusal he was following the precautions with approx 80% success.  SLP then reviewed pt's HEP with him and he req'd min A consistently. By end of session he was completing HEP with mod I.  PATIENT EDUCATION: Education details: See "today's treatment" Person educated: Patient Education method: Explanation, Demonstration, Verbal cues, and Handouts Education comprehension: verbalized understanding, returned demonstration, verbal cues required, and needs further education   ASSESSMENT:  CLINICAL IMPRESSION: Patient is a 46 y.o. male who was seen today for re-evaluation of swallowing after MBS on 07-25-21. He has not been performing any of the HEP. Again, [name] did not follow swallow precautions with POs, on his second swallow he turned head to rt and then did this independently.   OBJECTIVE IMPAIRMENTS include voice disorder and dysphagia. These impairments are limiting patient from safety when swallowing. Factors affecting potential to achieve goals and functional outcome are cooperation/participation level, medical prognosis, previous level of function, and severity of impairments. Patient will benefit from skilled SLP services to address above impairments and improve overall function.  REHAB POTENTIAL: Fair given above factors in "objective impairments".   GOALS: Goals reviewed with patient? Yes  SHORT TERM GOALS: Target date: 11/20/2021    Pt will complete HEP with rare min A Baseline: consistent min A Goal status: met  2.    Pt will follow swallow precautions with rare min A in 2 sessions Baseline: Total A Goal status: not met   LONG TERM GOALS: Target date: 12/18/2021    Pt will complete HEP with modified independence in 2 sessions Baseline: Consistent min A   11-22-21 Goal status: [place]  2.  Pt will follow swallow precautions with modified independence in 2 sessions Baseline: Total A Goal status: [place]  3.  Pt will tell SLP  three overt s/sx aspiration PNA Baseline: not provided yet Goal status: [place]  PLAN: SLP FREQUENCY: 2x/month  SLP DURATION: 8 weeks  PLANNED INTERVENTIONS: Aspiration precaution training, Pharyngeal strengthening exercises, Diet toleration management , Environmental controls, Trials of upgraded texture/liquids, SLP instruction and feedback, Compensatory strategies, and Patient/family education    [facility], [place] 11/22/2021, 5:41 PM

## 2021-11-22 NOTE — Progress Notes (Signed)
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) Staging form: Larynx - Supraglottis, AJCC 8th Edition - Clinical stage from 12/01/2020: Stage IVB (cT3, cN3b, cM0) - Signed by [name], MD on 12/20/2020 Stage prefix: Initial diagnosis   SUMMARY OF ONCOLOGIC HISTORY: Oncology History  Laryngeal cancer ([place])  12/01/2020 Cancer Staging   Staging form: Larynx - Supraglottis, AJCC 8th Edition - Clinical stage from 12/01/2020: Stage IVB (cT3, cN3b, cM0) - Signed by [name], MD on 12/20/2020 Stage prefix: Initial diagnosis   12/04/2020 Initial Diagnosis   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 [facility])   12/25/2020 - 01/24/2021 Chemotherapy   Patient is on Treatment Plan : HEAD/NECK Cisplatin + XRT q21d      CURRENT THERAPY: s/p chemo and radiation  INTERVAL HISTORY:  David [name] 46 y.o. male returns for evaluation of his head neck cancer.   Since his last visit, he had a PET/CT which was suggestive of possible residual disease but exploration and biopsy showed no evidence of malignancy which is very reassuring.  He followed up with Dr. Nicolette Bang recently.  He continues to eat better and has gained some weight.  His mom was a bit worried about the low blood pressure today.  He however remains asymptomatic.  He is also looking at having his trach removed soon.  No change in breathing.  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   Heart failure with reduced ejection fraction ([place]) 04/28/2021   Elevated troponin 04/01/2021   Elevated transaminase level 04/01/2021   Weight loss, unintentional 01/15/2021   Protein-calorie malnutrition, severe 01/10/2021   AKI (acute kidney injury) ([place]) 01/08/2021   Chemotherapy induced nausea and vomiting 01/01/2021   Goals of care, counseling/discussion 12/13/2020   Laryngeal cancer ([place]) 12/04/2020   Teeth missing 12/04/2020   Caries 12/04/2020   Retained tooth root 12/04/2020   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  MEDICAL HISTORY: Past Medical History:  Diagnosis Date   Acute respiratory failure with hypoxia (HCC)    Aspiration pneumonia of right lower lobe (HCC) 06/06/2021   Bacterial endocarditis    Chronic HFrEF (heart failure with reduced ejection fraction) ([place]) 06/09/2021   Community acquired pneumonia due to Pneumococcus ([place]) 04/01/2021   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[place])    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 ([place])    Pneumococcal bacteremia 06/09/2021   Pneumothorax    Left lung spontaneous pneumothorax at age 31 yr     SURGICAL HISTORY: Past Surgical History:  Procedure Laterality Date   chest tubes Left    IR GASTROSTOMY TUBE MOD SED  06/18/2021   LAPAROSCOPIC INSERTION GASTROSTOMY TUBE N/A 06/20/2021   Procedure:  LAPAROSCOPIC INSERTION GASTROSTOMY TUBE;  Surgeon: Clovis Riley, MD;  Location: [facility];  Service: General;  Laterality: N/A;  DOW PATIENT   TRACHEOSTOMY TUBE PLACEMENT N/A 06/09/2021   Procedure: AWAKE TRACHEOSTOMY;  Surgeon: Melida Quitter, MD;  Location: [facility] OR;  Service: ENT;  Laterality: N/A;    SOCIAL HISTORY: Social History    Socioeconomic History   Marital status: Divorced    Spouse name: Not on file   Number of children: Not on file   Years of education: Not on file   Highest education level: Not on file  Occupational History   Not on file  Tobacco Use   Smoking status: Every Day    Packs/day: 3.00    Types: Cigarettes   Smokeless tobacco: Never   Tobacco comments:    Patient denied tobacco use  Vaping Use   Vaping Use: Never used  Substance and Sexual Activity   Alcohol use: Not Currently    Alcohol/week: 12.0 standard drinks of alcohol    Types: 12 Cans of beer per week   Drug use: Not Currently    Types: IV    Comment: 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  Review of Systems  Constitutional:  Negative for appetite change, chills, fatigue, fever and unexpected weight change.  HENT:   Negative for hearing loss, lump/mass, sore throat and trouble swallowing.   Eyes:  Negative for eye problems and icterus.  Respiratory:  Negative for chest tightness, cough and shortness of breath.   Cardiovascular:  Negative for chest pain, leg swelling and palpitations.  Gastrointestinal:  Negative for abdominal distention, abdominal pain, constipation, diarrhea, nausea and vomiting.  Endocrine: Negative for hot flashes.  Genitourinary:  Negative for difficulty urinating.   Musculoskeletal:  Negative for arthralgias.  Skin:  Negative for itching and rash.  Neurological:  Negative for dizziness, extremity weakness, headaches and numbness.  Hematological:  Negative for  adenopathy. Does not bruise/bleed easily.  Psychiatric/Behavioral:  Negative for decreased concentration and depression. The patient is not nervous/anxious.       PHYSICAL EXAMINATION  ECOG PERFORMANCE STATUS: 1 - Symptomatic but completely ambulatory  Vitals:   11/22/21 1518  BP: (!) 86/67  Pulse: 74  Resp: 16  Temp: 98.1 F (36.7 C)  SpO2: 100%   Physical Exam Constitutional:      Appearance: Normal appearance.  Neck:     Comments: Right neck swelling, likely post treatment changes especially given the negative biopsy. Cardiovascular:     Rate and Rhythm: Normal rate and regular rhythm.     Pulses: Normal pulses.     Heart sounds: Normal heart sounds.  Pulmonary:     Effort: Pulmonary effort is normal.     Breath sounds: Normal breath sounds.  Musculoskeletal:        General: No swelling.     Cervical back: Normal range of motion and neck supple. No rigidity.  Lymphadenopathy:     Cervical: No cervical adenopathy.  Neurological:  Mental Status: He is alert.      LABORATORY DATA:  CBC    Component Value Date/Time   WBC 4.6 11/07/2021 1559   RBC 3.99 (L) 11/07/2021 1559   HGB 11.6 (L) 11/07/2021 1559   HGB 14.0 03/30/2021 0856   HCT 36.3 (L) 11/07/2021 1559   PLT 241 11/07/2021 1559   PLT 308 03/30/2021 0856   MCV 91.0 11/07/2021 1559   MCV 100.9 (A) 07/27/2011 1302   MCH 29.1 11/07/2021 1559   MCHC 32.0 11/07/2021 1559   RDW 14.7 11/07/2021 1559   LYMPHSABS 616 (L) 09/26/2021 1213   MONOABS 0.2 08/10/2021 1025   EOSABS 122 09/26/2021 1213   BASOSABS 31 09/26/2021 1213    CMP     Component Value Date/Time   NA 139 11/07/2021 1559   K 3.7 11/07/2021 1559   CL 105 11/07/2021 1559   CO2 28 11/07/2021 1559   GLUCOSE 104 (H) 11/07/2021 1559   BUN 12 11/07/2021 1559   CREATININE 0.62 11/07/2021 1559   CALCIUM 8.9 11/07/2021 1559   PROT 7.4 11/07/2021 1559   ALBUMIN 2.6 (L) 08/13/2021 0301   AST 29 11/07/2021 1559   AST 88 (H) 03/30/2021 0856    ALT 18 11/07/2021 1559   ALT 39 03/30/2021 0856   ALKPHOS 93 08/13/2021 0301   BILITOT 0.2 11/07/2021 1559   BILITOT 0.6 03/30/2021 0856   GFRNONAA >60 08/16/2021 0540   GFRNONAA >60 05/15/2021 1339   GFRAA  01/25/2008 2040    >60        The eGFR has been calculated using the MDRD equation. This calculation has not been validated in all clinical    PENDING LABS: CBC reviewed, mild leukocytosis, otherwise no evidence of anemia or thrombocytopenia. CMP reviewed.   RADIOGRAPHIC STUDIES:  No results found.  PET CT 9/28  CONCLUSION:   1.  Status post chemoradiation of a right hypopharyngeal/supraglottic squamous cell carcinoma and subsequent tracheostomy with foci of increased FDG uptake visualized along the posterior aspect of the left cricoid cartilage and along the anterior aspect of the right cricoarytenoid cartilage, which appear to correlate to foci of contrast enhancement seen on prior CT neck dated 08/11/2021 and are concerning for recurrent disease. Correlation with direct inspection and tissue sampling is recommended.  2.  Mild diffuse uptake along the tracheostomy site and central neck compartment is favored to represent posttreatment changes.  3.  Focus of mild FDG avidity likely within the upper esophagus could be further evaluated by endoscopy versus attention on follow-up imaging as clinically indicated. No definite associated soft tissue mass on CT is noted in this region.  4.  Non-FDG avid, peripherally calcified cervical lymph nodes are stable in comparison to prior CT neck. No new, enlarging or FDG avid lymph nodes are identified.  5.  Cardiomegaly, with significantly increased size of the right ventricle in comparison to prior. Recommend cardiologic consultation and echocardiogram if not already performed.    6.  Diffuse groundglass opacities and tree-in-bud opacifications with multiple subcentimeter nodules involving the right lung are new in comparison to prior PET  CT, however improved relative to most recent CT chest and likely represent evolution of prior pneumonia.  PATHOLOGY: 11/08/2021  Final Pathologic Diagnosis    A. RIGHT PYRIFORM, BIOPSY:               Squamous mucosa with fibrosis.              Negative for high-grade dysplasia and carcinoma  ASSESSMENT and THERAPY PLAN:   Laryngeal cancer [facility]) This is a 46 year old male patient with squamous cell carcinoma of the larynx post concurrent chemotherapy and radiation with cisplatin 100 mg per metered squared received 2 out of 3 doses who is here for follow up.  He recently had a PET/CT which was concerning for possible residual disease however biopsy did not show any evidence of residual malignancy.  He is very thrilled with this results.  He is also looking at having his trach removed soon.  He was instructed to follow-up with Dr. Nicolette Bang.  Recent CBC and CMP from October 11 satisfactory.  TSH from today is pending.  Since there is no evidence of residual disease, there is no clear role of seeing medical oncology on a regular basis at this point.  He was strongly encouraged to contact us with any new questions.  Otherwise we will plan to see him back in about a year.   All questions were answered. The patient knows to call the clinic with any problems, questions or concerns. We can certainly see the patient much sooner if necessary.  Total encounter time: 30 minutes in face-to-face visit time, chart review, lab review, care coordination, and documentation of the encounter*  *Total Encounter Time as defined by the Centers for Medicare and Medicaid Services includes, in addition to the face-to-face time of a patient visit (documented in the note above) non-face-to-face time: obtaining and reviewing outside history, ordering and reviewing medications, tests or procedures, care coordination (communications with other health care professionals or caregivers) and documentation in the medical  record.

## 2021-11-22 NOTE — Assessment & Plan Note (Signed)
This is a 46 year old male patient with squamous cell carcinoma of the larynx post concurrent chemotherapy and radiation with cisplatin 100 mg per metered squared received 2 out of 3 doses who is here for follow up.  He recently had a PET/CT which was concerning for possible residual disease however biopsy did not show any evidence of residual malignancy.  He is very thrilled with this results.  He is also looking at having his trach removed soon.  He was instructed to follow-up with Dr. Nicolette Bang.  Recent CBC and CMP from October 11 satisfactory.  TSH from today is pending.  Since there is no evidence of residual disease, there is no clear role of seeing medical oncology on a regular basis at this point.  He was strongly encouraged to contact us with any new questions.  Otherwise we will plan to see him back in about a year.

## 2021-11-23 ENCOUNTER — Other Ambulatory Visit: Payer: Self-pay

## 2021-11-23 ENCOUNTER — Other Ambulatory Visit: Payer: Self-pay | Admitting: Radiation Oncology

## 2021-11-23 DIAGNOSIS — Z1329 Encounter for screening for other suspected endocrine disorder: Secondary | ICD-10-CM

## 2021-11-23 LAB — T4, FREE: Free T4: 0.71 ng/dL (ref 0.61–1.12)

## 2021-11-23 LAB — TSH: TSH: 5.244 u[IU]/mL — ABNORMAL HIGH (ref 0.350–4.500)

## 2021-11-23 NOTE — Progress Notes (Signed)
[place]  [phone] [phone]   Name: David [name] Date: 11/23/2021 MRN: [id number]  DOB: 17-Jun-1975  Patient Care Team: Delene Ruffini, MD as PCP - General (Internal Medicine) [name], [name], RN as Oncology Nurse Navigator [name], [name], DO as Consulting Physician (Otolaryngology) [name], MD as Consulting Physician (Radiation Oncology) [name], MD as Consulting Physician (Hematology and Oncology) Pickenpack-Cousar, Carlena Sax, NP as Nurse Practitioner (Nurse Practitioner)    REASON FOR CONSULTATION: David [name] is a 46 y.o. 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 He was recently hospitalized for several months at [facility] where he received treatment for fungal endocarditis and required emergent tracheostomy and PEG tube. During hospitalization CT of chest showed concerns for residual vs recurrent tumor. Palliative requested to re-engage for ongoing support and goals of care.    SOCIAL HISTORY:     reports that he has been smoking cigarettes. He has been smoking an average of 3 packs per day. He has never used smokeless tobacco. He reports that he does not currently use alcohol after a past usage of about 12.0 standard drinks of alcohol per week. He reports that he does not currently use drugs after having used the following drugs: IV.  ADVANCE DIRECTIVES:  Patient reports he does not have advanced directives.  Would like to further discuss in the future.  CODE STATUS: Full code  PAST MEDICAL HISTORY: Past Medical History:  Diagnosis Date   Acute respiratory failure with hypoxia (HCC)    Aspiration pneumonia of right lower lobe (HCC) 06/06/2021    Bacterial endocarditis    Chronic HFrEF (heart failure with reduced ejection fraction) ([place]) 06/09/2021   Community acquired pneumonia due to Pneumococcus ([place]) 04/01/2021   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[place])    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 ([place])    Pneumococcal bacteremia 06/09/2021   Pneumothorax    Left lung spontaneous pneumothorax at age 13 yr     PAST SURGICAL HISTORY:  Past Surgical History:  Procedure Laterality Date   chest tubes Left    IR GASTROSTOMY TUBE MOD SED  06/18/2021   LAPAROSCOPIC INSERTION GASTROSTOMY TUBE N/A 06/20/2021   Procedure: LAPAROSCOPIC INSERTION GASTROSTOMY TUBE;  Surgeon: Clovis Riley, MD;  Location: [facility];  Service: General;  Laterality: N/A;  DOW PATIENT   TRACHEOSTOMY TUBE PLACEMENT N/A 06/09/2021   Procedure: AWAKE TRACHEOSTOMY;  Surgeon: Melida Quitter, MD;  Location: [place];  Service: ENT;  Laterality: N/A;      ALLERGIES:  has No Known Allergies.  MEDICATIONS:  Current Outpatient Medications  Medication Sig Dispense Refill   chlorhexidine gluconate, MEDLINE KIT, (PERIDEX) 0.12 % solution Rinse  mouth with 15 mLs 2 (two) times daily. Do not eat or drink for 30 minutes. (Patient not taking: Reported on 11/07/2021) 473 mL 0   diclofenac Sodium (VOLTAREN) 1 % GEL Apply 2 g topically 4 (four) times daily as needed (pain). (Patient not taking: Reported on 11/07/2021) 100 g 2   DULoxetine (CYMBALTA) 60 MG capsule Take 1 capsule (60 mg total) by mouth daily. (Patient not taking: Reported on 11/07/2021) 90 capsule 2   fluconazole (DIFLUCAN) 200 MG tablet Take 4 tablets (800 mg total) by mouth daily. 005 tablet 3   folic acid (FOLVITE) 1 MG tablet Take 1 tablet (1 mg total) by  mouth daily. (Patient not taking: Reported on 11/07/2021) 90 tablet 2   guaiFENesin (MUCINEX) 600 MG 12 hr tablet Take 2 tablets (1,200 mg total) by mouth 2 (two) times daily. (Patient not taking: Reported on 11/07/2021) 60 tablet 1   hydrOXYzine (ATARAX) 25 MG tablet Place 4  tablets (100 mg total) into feeding tube 3 (three) times daily as needed for anxiety. (Patient not taking: Reported on 11/07/2021) 60 tablet 5   lidocaine (LIDODERM) 5 % Place 1 patch onto the skin daily. Remove & Discard patch within 12 hours or as directed by MD (Patient not taking: Reported on 11/07/2021) 30 patch 2   lidocaine (XYLOCAINE) 2 % solution Patient: Mix 1part 2% viscous lidocaine, 1part H20. Swish and spit 29m of diluted mixture up to eight times a day, prn soreness (Patient not taking: Reported on 11/07/2021) 200 mL 5   melatonin 3 MG TABS tablet Take 1 tablet (3 mg total) by mouth at bedtime as needed. (Patient not taking: Reported on 11/07/2021) 90 tablet 2   methadone (DOLOPHINE) 10 MG/ML solution Take 6 mLs (60 mg total) by mouth daily. (Patient taking differently: Take 90 mg by mouth daily.) 90 mL 0   Multiple Vitamin (MULTIVITAMIN WITH MINERALS) TABS tablet Place 1 tablet into feeding tube daily. (Patient not taking: Reported on 11/07/2021) 90 tablet 2   nicotine (NICODERM CQ - DOSED IN MG/24 HOURS) 14 mg/24hr patch Place 1 patch (14 mg total) onto the skin daily. Apply 21 mg patch daily x 6 wk, then 172mpatch daily x 2 wk, then 7 mg patch daily x 2 wk (Patient not taking: Reported on 06/06/2021) 14 patch 0   ondansetron (ZOFRAN) 8 MG tablet Take 1 tablet (8 mg total) by mouth every 8 (eight) hours as needed for nausea or vomiting. Starting day 3 after chemotherapy (Patient not taking: Reported on 11/07/2021) 30 tablet 2   pantoprazole (PROTONIX) 40 MG tablet Take 1 tablet (40 mg total) by mouth daily. (Patient not taking: Reported on 11/07/2021) 90 tablet 3   phenol (CHLORASEPTIC) 1.4 % LIQD Use as directed 1 spray in the mouth or throat as needed for throat irritation / pain. (Patient not taking: Reported on 11/07/2021) 177 mL 5   polyethylene glycol powder (GLYCOLAX/MIRALAX) 17 GM/SCOOP powder Place 17 g in water and drink daily. (Patient not taking: Reported on 11/07/2021) 238 g  3   Pregabalin 20 MG/ML SOLN 20 mg by PEG Tube route in the morning and at bedtime. 60 mL 1   sodium chloride 0.9 % nebulizer solution Take 3 mLs by nebulization every 8 (eight) hours as needed (Secretions). (Patient not taking: Reported on 11/07/2021) 90 mL 12   sodium chloride flush (NS) 0.9 % SOLN 10-40 mLs by Intracatheter route as needed (flush). (Patient not taking: Reported on 11/07/2021) 200 mL 5   thiamine (VITAMIN B1) 100 MG tablet Place 1 tablet (100 mg total) into feeding tube daily. (Patient not taking: Reported on 11/07/2021) 90 tablet 2   Zinc Oxide (TRIPLE PASTE) 12.8 % ointment Apply topically as needed for irritation. Apply to trach breakdown site (Patient not taking: Reported on 11/07/2021) 227 g 2   No current facility-administered medications for this visit.    VITAL SIGNS: There were no vitals taken for this visit. There were no vitals filed for this visit.    Estimated body mass index is 14.43 kg/m as calculated from the following:   Height as of an earlier encounter on 11/22/21: _0  (1.753 m).  Weight as of an earlier encounter on 11/22/21: 97 lb 11.2 oz (44.3 kg).  PERFORMANCE STATUS (ECOG) : 1 - Symptomatic but completely ambulatory  Physical Exam General: NAD, cachectic Cardiovascular: regular rate and rhythm Pulmonary: clear ant fields, normal breathing pattern, tracheostomy midline, Passey muir valve  Abdomen: soft, nontender, + bowel sounds, PEG in place Extremities: no edema, no joint deformities Neurological: AAOx3, mood appropriate  IMPRESSION:  David [name] presents to the clinic today for follow-up post hospitalization. His mother, David [name] is also present. Reports things are going well  He is excited sharing with me that recent test and biospy did not show any active cancer.  Emotional support provided.   He is continuing to do well at his apartment. I can truly see a difference in his lifestyle and personality in a positive way.  His appetite     Pain He is being followed at the methadone clinic ([facility]). Endorses occasional pain or discomfort in his throat area and upper back. He is tolerating lidocaine patches and Lyrica as prescribed. Feels this is helping.   I will continue to closely monitor and adjust as needed.    We discussed his recent illness and future plans. David [name] and his mother are clear in expressed goals to continue to treat the treatable. He is planning on proceeding with any suggested treatments and interventions.  He is remaining hopeful and expresses appreciation of ongoing support to allow him every opportunity to thrive.      PLAN:  Ongoing goals of care discussions.  Lidocaine patch to back  Pregabalin 20 mg twice daily via peg  I will plan to see patient back in the clinic in 4-6 weeks in collaboration with his other oncology appointments.   Patient and mother expressed understanding and was in agreement with this plan. He also understands that He can call the clinic at any time with any questions, concerns, or complaints.    Any controlled substances utilized were prescribed in the context of palliative care. PDMP has been reviewed.    Time Total: 25 min   Visit consisted of counseling and education dealing with the complex and emotionally intense issues of symptom management and palliative care in the setting of serious and potentially life-threatening illness.Greater than 50%  of this time was spent counseling and coordinating care related to the above assessment and plan.  Alda Lea, AGPCNP-BC  Palliative Medicine Team/[place]

## 2021-12-04 ENCOUNTER — Ambulatory Visit (INDEPENDENT_AMBULATORY_CARE_PROVIDER_SITE_OTHER): Payer: Medicaid Other | Admitting: Dentistry

## 2021-12-04 ENCOUNTER — Encounter (HOSPITAL_COMMUNITY): Payer: Self-pay | Admitting: Dentistry

## 2021-12-04 VITALS — BP 102/77 | HR 74 | Temp 98.2°F

## 2021-12-04 DIAGNOSIS — K036 Deposits [accretions] on teeth: Secondary | ICD-10-CM | POA: Diagnosis not present

## 2021-12-04 DIAGNOSIS — F40232 Fear of other medical care: Secondary | ICD-10-CM

## 2021-12-04 DIAGNOSIS — Z0120 Encounter for dental examination and cleaning without abnormal findings: Secondary | ICD-10-CM

## 2021-12-04 DIAGNOSIS — K045 Chronic apical periodontitis: Secondary | ICD-10-CM

## 2021-12-04 DIAGNOSIS — K0530 Chronic periodontitis, unspecified: Secondary | ICD-10-CM | POA: Diagnosis not present

## 2021-12-04 DIAGNOSIS — Z923 Personal history of irradiation: Secondary | ICD-10-CM

## 2021-12-04 DIAGNOSIS — Y842 Radiological procedure and radiotherapy as the cause of abnormal reaction of the patient, or of later complication, without mention of misadventure at the time of the procedure: Secondary | ICD-10-CM

## 2021-12-04 DIAGNOSIS — K117 Disturbances of salivary secretion: Secondary | ICD-10-CM

## 2021-12-04 DIAGNOSIS — K0850 Unsatisfactory restoration of tooth, unspecified: Secondary | ICD-10-CM | POA: Diagnosis not present

## 2021-12-04 DIAGNOSIS — K083 Retained dental root: Secondary | ICD-10-CM

## 2021-12-04 DIAGNOSIS — K029 Dental caries, unspecified: Secondary | ICD-10-CM

## 2021-12-04 DIAGNOSIS — K08109 Complete loss of teeth, unspecified cause, unspecified class: Secondary | ICD-10-CM

## 2021-12-04 DIAGNOSIS — K011 Impacted teeth: Secondary | ICD-10-CM

## 2021-12-04 NOTE — Progress Notes (Signed)
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 C/C denture fabrication after 4-6 week healing period 1 year recall visits  NEXT VISIT: Full mouth extractions    Service Date:   12/04/2021  Patient Name:   David [name] Date of Birth:   July 09, 1975 Medical Record Number: [id number]  Referring Provider:           [name], MD   HISTORY OF PRESENT ILLNESS: David [name] is a 46 y.o. male with history of laryngeal cancer status-post chemoradiation therapy, bacterial endocarditis, chronic HFrEF, substance abuse (heroin addiction- former) and acute respiratory failure who presents today for a comprehensive dental exam.   DENTAL HISTORY: The patient is a patient of record at the hospital dental clinic. His mother reports that they did end up going to [facility] for a consultation after referral was placed, however the provider at that time stated he was unable to treat the patient due to his history of head and neck radiation. The patient states that he has several teeth that have been breaking off and is seeking comprehensive dental care.  He currently denies any dental/orofacial pain or sensitivity. Patient is able to manage oral secretions.  Patient denies dysphagia, odynophagia, dysphonia, SOB and neck pain.  Patient denies fever, rigors and malaise.   CHIEF COMPLAINT: Here for a comprehensive dental exam.   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   Heart failure with reduced ejection fraction ([place]) 04/28/2021   Elevated troponin 04/01/2021   Elevated transaminase level 04/01/2021   Weight loss, unintentional 01/15/2021   Protein-calorie malnutrition, severe 01/10/2021   AKI (acute kidney injury) ([place]) 01/08/2021   Chemotherapy induced nausea and vomiting 01/01/2021   Goals of care, counseling/discussion 12/13/2020   Laryngeal cancer ([place]) 12/04/2020   Teeth missing 12/04/2020   Caries 12/04/2020   Retained tooth root 12/04/2020   Chronic apical periodontitis 12/04/2020   Accretions on teeth 12/04/2020   Past Medical History:  Diagnosis Date   Acute respiratory failure with hypoxia (HCC)    Aspiration pneumonia of right lower lobe ([place]) 06/06/2021   Bacterial endocarditis    Chronic HFrEF (heart failure with reduced ejection fraction) ([place]) 06/09/2021   Community acquired pneumonia due to Pneumococcus ([place]) 04/01/2021   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[place])    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 ([place])    Pneumococcal bacteremia 06/09/2021   Pneumothorax    Left lung spontaneous pneumothorax at age 82 yr    Past Surgical History:  Procedure Laterality Date   chest tubes Left    IR GASTROSTOMY TUBE MOD SED  06/18/2021  LAPAROSCOPIC INSERTION GASTROSTOMY TUBE N/A 06/20/2021   Procedure: LAPAROSCOPIC INSERTION GASTROSTOMY TUBE;  Surgeon: Clovis Riley, MD;  Location: [facility];  Service: General;  Laterality: N/A;  DOW PATIENT   TRACHEOSTOMY TUBE PLACEMENT N/A 06/09/2021   Procedure: AWAKE  TRACHEOSTOMY;  Surgeon: Melida Quitter, MD;  Location: [facility] OR;  Service: ENT;  Laterality: N/A;   No Known Allergies Current Outpatient Medications  Medication Sig Dispense Refill   chlorhexidine gluconate, MEDLINE KIT, (PERIDEX) 0.12 % solution Rinse  mouth with 15 mLs 2 (two) times daily. Do not eat or drink for 30 minutes. (Patient not taking: Reported on 11/07/2021) 473 mL 0   diclofenac Sodium (VOLTAREN) 1 % GEL Apply 2 g topically 4 (four) times daily as needed (pain). (Patient not taking: Reported on 11/07/2021) 100 g 2   DULoxetine (CYMBALTA) 60 MG capsule Take 1 capsule (60 mg total) by mouth daily. (Patient not taking: Reported on 11/07/2021) 90 capsule 2   fluconazole (DIFLUCAN) 200 MG tablet Take 4 tablets (800 mg total) by mouth daily. 657 tablet 3   folic acid (FOLVITE) 1 MG tablet Take 1 tablet (1 mg total) by mouth daily. (Patient not taking: Reported on 11/07/2021) 90 tablet 2   guaiFENesin (MUCINEX) 600 MG 12 hr tablet Take 2 tablets (1,200 mg total) by mouth 2 (two) times daily. (Patient not taking: Reported on 11/07/2021) 60 tablet 1   hydrOXYzine (ATARAX) 25 MG tablet Place 4 tablets (100 mg total) into feeding tube 3 (three) times daily as needed for anxiety. (Patient not taking: Reported on 11/07/2021) 60 tablet 5   lidocaine (LIDODERM) 5 % Place 1 patch onto the skin daily. Remove & Discard patch within 12 hours or as directed by MD 30 patch 2   lidocaine (XYLOCAINE) 2 % solution Patient: Mix 1part 2% viscous lidocaine, 1part H20. Swish and spit 50m of diluted mixture up to eight times a day, prn soreness (Patient not taking: Reported on 11/07/2021) 200 mL 5   melatonin 3 MG TABS tablet Take 1 tablet (3 mg total) by mouth at bedtime as needed. (Patient not taking: Reported on 11/07/2021) 90 tablet 2   methadone (DOLOPHINE) 10 MG/ML solution Take 6 mLs (60 mg total) by mouth daily. (Patient taking differently: Take 90 mg by mouth daily.) 90 mL 0   Multiple Vitamin (MULTIVITAMIN  WITH MINERALS) TABS tablet Place 1 tablet into feeding tube daily. (Patient not taking: Reported on 11/07/2021) 90 tablet 2   nicotine (NICODERM CQ - DOSED IN MG/24 HOURS) 14 mg/24hr patch Place 1 patch (14 mg total) onto the skin daily. Apply 21 mg patch daily x 6 wk, then 167mpatch daily x 2 wk, then 7 mg patch daily x 2 wk (Patient not taking: Reported on 06/06/2021) 14 patch 0   ondansetron (ZOFRAN) 8 MG tablet Take 1 tablet (8 mg total) by mouth every 8 (eight) hours as needed for nausea or vomiting. Starting day 3 after chemotherapy (Patient not taking: Reported on 11/07/2021) 30 tablet 2   Oxycodone HCl 10 MG TABS Take 1 tablet (10 mg total) by mouth every 6 (six) hours. 30 tablet 0   pantoprazole (PROTONIX) 40 MG tablet Take 1 tablet (40 mg total) by mouth daily. (Patient not taking: Reported on 11/07/2021) 90 tablet 3   phenol (CHLORASEPTIC) 1.4 % LIQD Use as directed 1 spray in the mouth or throat as needed for throat irritation / pain. (Patient not taking: Reported on 11/07/2021) 177 mL 5   polyethylene glycol powder (GLYCOLAX/MIRALAX)  17 GM/SCOOP powder Place 17 g in water and drink daily. (Patient not taking: Reported on 11/07/2021) 238 g 3   Pregabalin 20 MG/ML SOLN Take 20 mg by mouth 3 (three) times daily. 60 mL 1   sodium chloride 0.9 % nebulizer solution Take 3 mLs by nebulization every 8 (eight) hours as needed (Secretions). (Patient not taking: Reported on 11/07/2021) 90 mL 12   sodium chloride flush (NS) 0.9 % SOLN 10-40 mLs by Intracatheter route as needed (flush). (Patient not taking: Reported on 11/07/2021) 200 mL 5   thiamine (VITAMIN B1) 100 MG tablet Place 1 tablet (100 mg total) into feeding tube daily. (Patient not taking: Reported on 11/07/2021) 90 tablet 2   Zinc Oxide (TRIPLE PASTE) 12.8 % ointment Apply topically as needed for irritation. Apply to trach breakdown site (Patient not taking: Reported on 11/07/2021) 227 g 2   No current facility-administered medications for  this visit.    LABS: Lab Results  Component Value Date   WBC 4.6 11/07/2021   HGB 11.6 (L) 11/07/2021   HCT 36.3 (L) 11/07/2021   MCV 91.0 11/07/2021   PLT 241 11/07/2021      Component Value Date/Time   NA 139 11/07/2021 1559   K 3.7 11/07/2021 1559   CL 105 11/07/2021 1559   CO2 28 11/07/2021 1559   GLUCOSE 104 (H) 11/07/2021 1559   BUN 12 11/07/2021 1559   CREATININE 0.62 11/07/2021 1559   CALCIUM 8.9 11/07/2021 1559   GFRNONAA >60 08/16/2021 0540   GFRNONAA >60 05/15/2021 1339   GFRAA  01/25/2008 2040    >60        The eGFR has been calculated using the MDRD equation. This calculation has not been validated in all clinical   Lab Results  Component Value Date   INR 1.1 09/14/2021   INR 1.2 03/31/2021   INR 1.1 12/08/2020   No results found for: "PTT"  Social History   Socioeconomic History   Marital status: Divorced    Spouse name: Not on file   Number of children: Not on file   Years of education: Not on file   Highest education level: Not on file  Occupational History   Not on file  Tobacco Use   Smoking status: Every Day    Packs/day: 3.00    Types: Cigarettes   Smokeless tobacco: Never   Tobacco comments:    Patient denied tobacco use  Vaping Use   Vaping Use: Never used  Substance and Sexual Activity   Alcohol use: Not Currently    Alcohol/week: 12.0 standard drinks of alcohol    Types: 12 Cans of beer per week   Drug use: Not Currently    Types: IV    Comment: 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)   Sexual activity: Not on file  Other Topics Concern   Not on file  Social History Narrative   Not on file   Social Determinants of Health   Financial Resource Strain: Not on file  Food Insecurity: Not on file  Transportation Needs: Not on file  Physical Activity: Not on file  Stress: Not on file  Social Connections: Not on file  Intimate Partner Violence: Not on file   History reviewed. No pertinent family  history.   REVIEW OF SYSTEMS:  Reviewed with the patient as per HPI. PSYCH:  [+] Dental phobia   VITAL SIGNS:  BP 102/77 (BP Location: Right Arm, Patient Position: Sitting, Cuff Size: Normal)  Pulse 74   Temp 98.2 F (36.8 C) (Oral)    PHYSICAL EXAM:  Soft tissue exam completed and charted.  GENERAL:  Well-developed, comfortable and in no apparent distress. NEUROLOGICAL:  Alert and oriented to person, place and  time. EXTRAORAL:  Head size, head shape, facial symmetry, skin, complexion, pigmentation, conjunctiva, oral labia, parotid gland, submandibular gland, thyroid, cervical and preauricular lymph nodes, submandibular and submental lymph nodes, tonsillar and occipital lymph nodes and supraclavicular lymph nodes WNL. No swelling or lymphadenopathy.  TMJ asymptomatic without clicks or crepitations. MAXIMUM INTERINCISAL OPENING:  40 mm measured on 01/18/2022 at status-post radiation visit INTRAORAL:  Soft tissues appear well-perfused and mucous membranes moist.  FOM and vestibules soft and not raised. Oral cavity without mass or lesion. No signs of infection, parulis, sinus tract, edema or erythema evident upon exam. [+] Soft tissues:  Dry; thick, viscous saliva   DENTAL EXAM:  Hard tissue and periodontal exams completed and charted.      OVERALL IMPRESSION:  Poor remaining dentition.       ORAL HYGIENE:  Poor   HARD TISSUE:    MISSING:  #1, #17, #32 CARIES:  Rampant decay in all 4 quadrants; radiation caries. RETAINED ROOTS:  #31  DEFECTIVE RESTORATIONS:  #3, #14, #15, #18, #19 and #30 existing amalgam restorations with recurrent decay. OCCLUSION:  Unable to assess molar occlusion.  Non-functional teeth numbers 2 and 16.   PERIODONTAL: PROBING DEPTHS:  Not measured today; patient needs full mouth extractions. PLAQUE:  Heavy, generalized CALCULUS:  Generalized accumulation GINGIVAL APPEARANCE:  Inflamed, erythematous gingival tissue  Exam was performed by Sandi Mariscal, DMD with Leta Speller, DAII acting as scribe.   RADIOGRAPHIC EXAM:   PAN and Full Mouth Series were exposed on 12/04/2020 and interpreted.    Condyles seated bilaterally in fossas.  No evidence of abnormal pathology.  All visualized osseous structures appear WNL. Impacted 3rd molar #16.  Missing teeth numbers 1, 17 and 32. Tooth #2 appears supra-erupted.   Localized mild horizontal bone loss consistent with mild periodontitis.   Missing teeth numbers 1, 17, 32.  Impacted 3rd molar #16.  Severe decay approximating the pulp on teeth numbers 5, 7, 8, 9, 10, 12, 13, 28, 29 and 30.  Periapical radiolucency & retained tooth roots #31.  Existing restorations on teeth numbers 2, 14, 15, 18, 19 & 30.  Caries- rampant decay in all 4 quadrants.     ASSESSMENT:   1. History of head and neck cancer 2. History of radiation therapy to the head and neck region 3. New patient dental exam 4.  Radiation caries 5.  Chronic apical periodontitis 6.  Retained root tips 7.  Defective restorations 8.  Accretions on teeth 9.  Chronic periodontitis 10.  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  Xerostomia due to radiotherapy 13.  Retained tooth root   PLAN/RECOMMENDATIONS: I discussed the risks, benefits, and complications of various scenarios with the patient in relationship to their medical and dental conditions.  I explained all significant findings of the dental consultation with the patient including severe, rampant decay in all 4 quadrants of his mouth, and the recommended care including full mouth extractions with alveoloplasty as needed in order to optimize them from a dental standpoint.  The patient verbalized understanding of all findings, discussion, and recommendations. We then discussed various treatment options to include no treatment, multiple extractions with alveoloplasty, pre-prosthetic surgery as indicated, periodontal therapy, dental restorations, root canal therapy, crown and  bridge  therapy, implant therapy, and replacement of missing teeth as indicated.   The patient verbalized understanding of all options, and currently wishes to proceed with the treatment plan below:  Full Mouth Extractions in the operating room under general anesthesia C/C denture fabrication following 4-6 week healing period Recall interval: 1 year edentulous recall visits  Plan to discuss all findings and recommendations with medical team as needed. The patient will need updated H&P prior to the patient's dental surgery as well as clearance from cardiac doctors. Call if questions or concerns arise before next visit.  All questions and concerns were invited and addressed.  The patient tolerated today's visit well and departed in stable condition.     NEXT VISIT:  Full mouth extractions  I spent in excess of 120 minutes during the conduct of this consultation and >50% of this time involved direct face-to-face encounter for counseling and/or coordination of the patient's care.  Sandi Mariscal, DMD

## 2021-12-06 ENCOUNTER — Ambulatory Visit: Payer: Medicaid Other | Attending: Radiation Oncology

## 2021-12-06 DIAGNOSIS — R490 Dysphonia: Secondary | ICD-10-CM | POA: Insufficient documentation

## 2021-12-06 DIAGNOSIS — R1310 Dysphagia, unspecified: Secondary | ICD-10-CM | POA: Insufficient documentation

## 2021-12-06 NOTE — Patient Instructions (Signed)
   To say safe when eating: *Clear your throat or cough hard -then swallow, after every bite or sip  *Turn your head to the right when you drink  *Small bites and sips ============================  Signs of Aspiration Pneumonia   Chest pain/tightness Fever (can be low grade) Cough  With foul-smelling phlegm (sputum) With sputum containing pus or blood With greenish sputum Fatigue  Shortness of breath  Wheezing   **IF YOU HAVE THESE SIGNS, CONTACT YOUR DOCTOR OR GO TO THE EMERGENCY DEPARTMENT OR URGENT CARE AS SOON AS POSSIBLE**

## 2021-12-06 NOTE — Therapy (Signed)
OUTPATIENT SPEECH LANGUAGE PATHOLOGY SWALLOW TREATMENT   Patient Name: David [name] MRN: [id number] DOB:06/29/1975, 46 y.o., male Today's Date: 12/06/2021  PCP: Delene Ruffini, MD REFERRING PROVIDER: [name], MD   End of Session - 12/06/21 1448     Visit Number 3    Number of Visits 4    Date for SLP Re-Evaluation 12/24/21    Authorization Type Medicaid [place] Access    SLP Start Time 1355    SLP Stop Time  1430    SLP Time Calculation (min) 35 min    Activity Tolerance Patient tolerated treatment well               Past Medical History:  Diagnosis Date   Acute respiratory failure with hypoxia (HCC)    Aspiration pneumonia of right lower lobe ([place]) 06/06/2021   Bacterial endocarditis    Chronic HFrEF (heart failure with reduced ejection fraction) ([place]) 06/09/2021   Community acquired pneumonia due to Pneumococcus ([place]) 04/01/2021   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[place])    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 ([place])    Pneumococcal bacteremia 06/09/2021   Pneumothorax    Left lung spontaneous pneumothorax at age 56 yr    Past Surgical History:  Procedure Laterality Date   chest tubes Left    IR GASTROSTOMY TUBE MOD SED  06/18/2021   LAPAROSCOPIC INSERTION GASTROSTOMY TUBE N/A 06/20/2021   Procedure: LAPAROSCOPIC INSERTION GASTROSTOMY TUBE;  Surgeon: Clovis Riley, MD;  Location: [facility];  Service: General;  Laterality: N/A;  DOW PATIENT   TRACHEOSTOMY TUBE PLACEMENT N/A 06/09/2021   Procedure: AWAKE TRACHEOSTOMY;  Surgeon: Melida Quitter, MD;  Location: [facility];  Service: ENT;  Laterality: N/A;   Patient Active Problem List   Diagnosis Date Noted   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   Continuous tobacco abuse 11/21/2021   Dermatitis associated with moisture 10/16/2021   Back pain 08/16/2021   Candidal endocarditis    History of radiation to head and neck region 07/11/2021   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   Dysgeusia 07/11/2021   S/P  percutaneous endoscopic gastrostomy (PEG) tube placement ([place]) 06/24/2021   Pressure injury of skin 06/17/2021   Tracheostomy status ([place])    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   Heart failure with reduced ejection fraction ([place]) 04/28/2021   Elevated troponin 04/01/2021   Elevated transaminase level 04/01/2021   Weight loss, unintentional 01/15/2021   Protein-calorie malnutrition, severe 01/10/2021   AKI (acute kidney injury) ([place]) 01/08/2021   Chemotherapy induced nausea and vomiting 01/01/2021   Goals of care, counseling/discussion 12/13/2020   Laryngeal cancer ([place]) 12/04/2020   Teeth missing 12/04/2020   [place] 12/04/2020   Retained tooth root 12/04/2020   Chronic apical periodontitis 12/04/2020   Accretions on teeth 12/04/2020    ONSET DATE: 2022   REFERRING DIAG: C32.9 (ICD-10-CM) - Laryngeal cancer  THERAPY DIAG:  No diagnosis found.  Rationale for Evaluation and Treatment Rehabilitation  SUBJECTIVE:   SUBJECTIVE STATEMENT: "My feeding tube hurts - I think I pulled it a bit when I was sleeping." "Everything (I eat) is going easier and easier." Pt accompanied by: self  PERTINENT HISTORY: Pt seen for course of ST with limited/poor compliance in the past. 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  This condition required placement of tracheostomy and gastrostomy tubes.  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  They were discharged from [facility] on hospital  day 77 (08-22-21) in Stable condition. Prior rad tx to larynx and bil neck completed on 02-08-21. Now with possible recurrent disease. Consult 10-17-21 with Dr. Denman George at [facility], with results/decisions pending. PET scheduled 10-25-21 and biopsy on 11-08-21 at [facility] ([place]  LIVING ENVIRONMENT: Lives with: lives alone Lives in: House/apartment  PLOF:  Level of  assistance: Independent with ADLs Employment: Other: does not work  PATIENT GOALS  Improve swallow  OBJECTIVE:   DIAGNOSTIC FINDINGS: PET scheduled for 10-25-21 at [facility].  RECOMMENDATIONS FROM OBJECTIVE SWALLOW STUDY (MBSS/FEES) 07/25/21:   Clinical Impression Patient presents with mild improvements in swallow function as compared to previous MBS on 5/26 however at that time he also had Cortrak feeding tube impacting swallow function. Main improvements noted during today's MBS are reduced pharyngeal residuals, improved consistency and effectiveness of using swallow strategies of head turn right during swallow and cough/throat clear and reswallow and decreased intensity of discomfort from penetration/aspiration. Patient independently used both strategies and this was effective to clear all observed penetrates which occured with nectar thick and thin liquid barium and cleared majority of aspirate. He did exhibit one instance of trace silent aspiration of thin liquid barium during this study. When SLP asked patient is comfort level when swallowing thin liquid barium with added requirement of swallow strategies, he candidly replied, "I've been doing it already" and that when he has a liquid "like a regular coke" he has to be more careful with his swallowing. SLP is recommending to liberalize patient's diet to allow thin liquids at this time as he is demonstrating consistent and effective use of learned swallow strategies to reduce his aspiration risk. SLP will follow briefly to ensure education complete and patient comfortable with this change. Swallow Evaluation Recommendations  SLP Diet Recommendations: Regular solids;Thin liquid  Liquid Administration via: Cup  Medication Administration: Whole meds with puree  Supervision: Patient able to self feed  Compensations: Slow rate;Small sips/bites;Other (Comment);Clear throat after each swallow;Hard cough after swallow (head turn right during swallows with  liquids)  Postural Changes: Seated upright at 90 degrees;Remain semi-upright after after feeds/meals (Comment) (30 minutes)  Oral Care Recommendations: Oral care BID;Patient independent with oral care  Other Recommendations: Have oral suction available               PATIENT REPORTED OUTCOME MEASURES (PROM): EAT-10: pt scored 14/40 (lower scores indicate better QOL)   TODAY'S TREATMENT:  12/06/21: Microwave meals, pinwheels, dark chocolate, tuna salad. Pt reports he is gaining weight. Today pt smells heavily like cigarette smoke. HEP completed with independence. Continues to complete HEP at suboptimal level "Maybe once a week." SLP reiterated to pt to try to increase frequency and get total reps to x20/day. Pt told SLP rationale for this, when SLP palpated pt's submandibular and submental region - minimal fibrosis felt today. Pt ate two bites of cereal bar and two of applesauce and drank 5 sips water without overt s/sx aspiration, however this might not be adequate symptom of aspiration given pt's history. Pt did not follow any precautions from the [place] in June (again). SLP reviewed precautions and provided them to pt, along with overt s/sx of aspiration PNA, which pt told SLP. SLP again informed pt why following precautions was important and pt acknowledged understanding. SLP congratulated pt that his procedure for HEP was correct and that he needed to just do the exercises at least x7 at a time with aim to get to 20 reps/day.  11/22/21: Pt did not follow any precautions from MBS  from June 2023. SLP reiterated precautions. Pt stated that he was having more difficulty with some Tylenol last week. [name] said it "stuck in my throat" and he had to use a liquid wash to clear. SLP worked with [name] today with applesauce and tic tacs, to teach [name] how to use applesauce with medication. Using this method, pharyngeal clearance was achieved. [name] said that sometimes he has difficulty with food remaining in  his throat after the first two swallows. SLP encouraged him to use alternating bite and sip technique, and demonstrated this during the session. [name] demonstrated understanding. He demonstrated delayed through clear on one sip of 8 during the session. No overt signs and symptoms of aspiration pneumonia were present today.  [name] told SLP in "s" statement he had not been completing HEP and SLP reminded pt yet again rationale for completing HEP, highlighting that aspiration may cause aspiration PNA, using visual aid of sagittal view of oral and pharyngeal cavities. Pt demonstrated understanding of rationale for HEP. Despite not completing HEP, he completed it today with independence.  10/23/21: Because he did not follow precautions with POs, SLP reiterated rationale for these precautions. Pt stated that at one time he had been told some aspiration is considered WNL. SLP told pt that in his condition no aspiration is optimal and strongly recommended pt adhere to precautions to reduce/eliminate aspiration risk.  SLP continued by assisting pt in adhering to aspiration precautions - after 2 minutes pt refused to continue with practicing these compensations. At time of refusal he was following the precautions with approx 80% success.  SLP then reviewed pt's HEP with him and he req'd min A consistently. By end of session he was completing HEP with mod I.  PATIENT EDUCATION: Education details: See "today's treatment" Person educated: Patient Education method: Explanation, Demonstration, Verbal cues, and Handouts Education comprehension: verbalized understanding, returned demonstration, verbal cues required, and needs further education   ASSESSMENT:  CLINICAL IMPRESSION: Patient is a 46 y.o. male who was seen today for re-evaluation of swallowing after MBS on 07-25-21. He continues to choose to not perform the HEP at recommended frequency. Today with POs, [name] did not follow swallow precautions from [facility]  07-25-21.   OBJECTIVE IMPAIRMENTS include voice disorder and dysphagia. These impairments are limiting patient from safety when swallowing. Factors affecting potential to achieve goals and functional outcome are cooperation/participation level, medical prognosis, previous level of function, and severity of impairments. Patient will benefit from skilled SLP services to address above impairments and improve overall function. Consider d/c next 1-2 visits if HEP compliance does not improve and pt cont to look safe with POs.   REHAB POTENTIAL: Fair given above factors in "objective impairments".   GOALS: Goals reviewed with patient? Yes  SHORT TERM GOALS: Target date: 11/20/2021    Pt will complete HEP with rare min A Baseline: consistent min A Goal status: met  2.    Pt will follow swallow precautions with rare min A in 2 sessions Baseline: Total A Goal status: not met   LONG TERM GOALS: Target date: 12/18/2021    Pt will complete HEP with modified independence in 2 sessions Baseline: Consistent min A   11-22-21 Goal status: Met  2.  Pt will follow swallow precautions with modified independence in 2 sessions Baseline: Total A Goal status: [place]  3.  Pt will tell SLP three overt s/sx aspiration PNA Baseline: not provided yet Goal status: Met  PLAN: SLP FREQUENCY: 2x/month  SLP DURATION: 8 weeks  PLANNED INTERVENTIONS: Aspiration precaution training, Pharyngeal strengthening exercises, Diet toleration management , Environmental controls, Trials of upgraded texture/liquids, SLP instruction and feedback, Compensatory strategies, and Patient/family education    [facility], [place] 12/06/2021, 2:48 PM

## 2021-12-10 ENCOUNTER — Telehealth: Payer: Self-pay | Admitting: Internal Medicine

## 2021-12-10 NOTE — Telephone Encounter (Signed)
Returned call to patient's mother. Feeding tube was placed on 5/24 during hospitalization by Romana Juniper, MD with [facility]. Gave mom contact number for that office: [phone]. She is very Patent attorney.

## 2021-12-10 NOTE — Telephone Encounter (Signed)
Pt's mother states his Feeding tube is taped up on his stomach and she thinks he needs to have it fixed.  Pt feels like it is coming out  Pt is complaining of his stomach hurting at the site.

## 2021-12-12 ENCOUNTER — Ambulatory Visit (INDEPENDENT_AMBULATORY_CARE_PROVIDER_SITE_OTHER): Payer: Medicaid Other | Admitting: Student

## 2021-12-12 ENCOUNTER — Other Ambulatory Visit: Payer: Self-pay | Admitting: Internal Medicine

## 2021-12-12 VITALS — BP 119/98 | HR 96 | Temp 98.1°F | Wt 95.6 lb

## 2021-12-12 DIAGNOSIS — B376 Candidal endocarditis: Secondary | ICD-10-CM

## 2021-12-12 DIAGNOSIS — K9423 Gastrostomy malfunction: Secondary | ICD-10-CM

## 2021-12-12 DIAGNOSIS — K9422 Gastrostomy infection: Secondary | ICD-10-CM

## 2021-12-12 DIAGNOSIS — K029 Dental caries, unspecified: Secondary | ICD-10-CM

## 2021-12-12 HISTORY — DX: Gastrostomy malfunction: K94.23

## 2021-12-12 MED ORDER — LIDOCAINE HCL URETHRAL/MUCOSAL 2 % EX GEL
1.0000 | Freq: Once | CUTANEOUS | Status: AC
  Administered 2021-12-12: 1 via TOPICAL

## 2021-12-12 MED ORDER — OXYCODONE HCL 10 MG PO TABS: 10.0000 mg | ORAL_TABLET | Freq: Four times a day (QID) | ORAL | 0 refills | Status: DC

## 2021-12-12 MED ORDER — OXYCODONE HCL 5 MG PO TABS
10.0000 mg | ORAL_TABLET | ORAL | Status: AC
  Administered 2021-12-12: 10 mg via ORAL
  Filled 2021-12-12: qty 2

## 2021-12-12 NOTE — Assessment & Plan Note (Signed)
Patient originally presenting today for dental surgery clearance. Denies any chest pain or dyspnea. Able to climb a few flights of stairs with any shortness of breath. 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 Does not have cardiologist and previously did not want one. After discussion with him and his mother, we will resend referral to cardiology. Also, most of the visit today involved his concern for PEG tube leaking.

## 2021-12-12 NOTE — Patient Instructions (Signed)
Thank you, Mr.Jonanthan Prieur for allowing [name] to provide your care today.   -Referral to [facility] sent.  -Referral to cardiology sent.  -I will sent a few tablets of oxycodone to your pharmacy to help with your pain.   Referrals ordered today:   Referral Orders         Amb Referral to [facility]         Ambulatory referral to Cardiology       Follow up:  2-4 weeks    Should you have any questions or concerns please call the internal medicine clinic at [phone].    Angelique Blonder, D.O. [place]

## 2021-12-12 NOTE — Assessment & Plan Note (Addendum)
Patient presents with concern of leaking PEG tube. Sunday he reports his belt tugged on the tube. Saw general surgery the next day and they applied silver nitrate over hypergranulation to help with drainage and irritation. Last night he endorses severe pain and increased drainage around site.   He has not been using the PEG tube for past 4-5 days. He is able to tolerate oral intake. Some throat soreness and discomfort from dental caries. Weight has been improving past few months.   Exam today showed silver nitrate application and drainage at PEG site. Some surrounding erythema at site. Patient reports severe pain and feels like PEG tube is going to popped out. During visit PEG tube dislodged. He was given lidocaine 2% jelly and one dose of oxycodone 10 mg with some relief. Provided wound care and discussed with patient about referral to ostomy clinic.  He was feeling better after completing wound care but as he left the room he complained of sharp pains at PEG site. Requested additional pain medication. Discussed with him and his mother that if pain is severe then his other option is to be seen in the emergency department.   Plan -urgent referral to ostomy clinic -continue following with general surgery  -sent oxycodone 10 mg (3 tablets, no refills) to pharmacy for pain

## 2021-12-12 NOTE — Progress Notes (Unsigned)
CC: dental surgery clearance  HPI:  David [name] is a 46 y.o. male living with a history stated below and presents today for dental surgery clearance. Please see problem based assessment and plan for additional details.  Past Medical History:  Diagnosis Date   Acute respiratory failure with hypoxia (HCC)    Aspiration pneumonia of right lower lobe (HCC) 06/06/2021   Bacterial endocarditis    Chronic HFrEF (heart failure with reduced ejection fraction) ([place]) 06/09/2021   Community acquired pneumonia due to Pneumococcus ([place]) 04/01/2021   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[place])    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 ([place])    Pneumococcal bacteremia 06/09/2021   Pneumothorax    Left lung spontaneous pneumothorax at age 42 yr     Current Outpatient Medications on File Prior to Visit  Medication Sig Dispense Refill   chlorhexidine gluconate, MEDLINE KIT, (PERIDEX) 0.12 % solution Rinse  mouth with 15 mLs 2 (two) times daily. Do not eat or drink for 30 minutes. (Patient not taking: Reported on 11/07/2021) 473 mL 0   diclofenac Sodium (VOLTAREN) 1 % GEL Apply 2 g topically 4 (four) times daily as needed (pain). (Patient not taking: Reported on 11/07/2021) 100 g 2   DULoxetine (CYMBALTA) 60 MG capsule Take 1 capsule (60 mg total) by mouth daily. (Patient not taking: Reported on 11/07/2021) 90 capsule 2   fluconazole (DIFLUCAN) 200 MG tablet Take 4 tablets (800 mg total) by mouth daily. 768 tablet 3   folic acid (FOLVITE) 1 MG tablet Take 1 tablet (1 mg total) by mouth daily. (Patient not taking: Reported on 11/07/2021) 90 tablet 2   guaiFENesin (MUCINEX) 600 MG 12 hr tablet Take 2 tablets (1,200 mg total) by mouth 2 (two) times daily. (Patient not taking: Reported on 11/07/2021) 60 tablet 1   hydrOXYzine (ATARAX) 25 MG tablet Place 4 tablets (100 mg total) into feeding tube 3 (three) times daily as needed for anxiety. (Patient not taking: Reported on 11/07/2021) 60 tablet 5   lidocaine  (LIDODERM) 5 % Place 1 patch onto the skin daily. Remove & Discard patch within 12 hours or as directed by MD (Patient not taking: Reported on 11/07/2021) 30 patch 2   lidocaine (XYLOCAINE) 2 % solution Patient: Mix 1part 2% viscous lidocaine, 1part H20. Swish and spit 36m of diluted mixture up to eight times a day, prn soreness (Patient not taking: Reported on 11/07/2021) 200 mL 5   melatonin 3 MG TABS tablet Take 1 tablet (3 mg total) by mouth at bedtime as needed. (Patient not taking: Reported on 11/07/2021) 90 tablet 2   methadone (DOLOPHINE) 10 MG/ML solution Take 6 mLs (60 mg total) by mouth daily. (Patient taking differently: Take 90 mg by mouth daily.) 90 mL 0   Multiple Vitamin (MULTIVITAMIN WITH MINERALS) TABS tablet Place 1 tablet into feeding tube daily. (Patient not taking: Reported on 11/07/2021) 90 tablet 2   nicotine (NICODERM CQ - DOSED IN MG/24 HOURS) 14 mg/24hr patch Place 1 patch (14 mg total) onto the skin daily. Apply 21 mg patch daily x 6 wk, then 125mpatch daily x 2 wk, then 7 mg patch daily x 2 wk (Patient not taking: Reported on 06/06/2021) 14 patch 0   ondansetron (ZOFRAN) 8 MG tablet Take 1 tablet (8 mg total) by mouth every 8 (eight) hours as needed for nausea or vomiting. Starting day 3 after chemotherapy (Patient not taking: Reported on 11/07/2021) 30 tablet 2   pantoprazole (PROTONIX) 40 MG tablet Take 1  tablet (40 mg total) by mouth daily. (Patient not taking: Reported on 11/07/2021) 90 tablet 3   phenol (CHLORASEPTIC) 1.4 % LIQD Use as directed 1 spray in the mouth or throat as needed for throat irritation / pain. (Patient not taking: Reported on 11/07/2021) 177 mL 5   polyethylene glycol powder (GLYCOLAX/MIRALAX) 17 GM/SCOOP powder Place 17 g in water and drink daily. (Patient not taking: Reported on 11/07/2021) 238 g 3   Pregabalin 20 MG/ML SOLN 20 mg by PEG Tube route in the morning and at bedtime. 60 mL 1   sodium chloride 0.9 % nebulizer solution Take 3 mLs by  nebulization every 8 (eight) hours as needed (Secretions). (Patient not taking: Reported on 11/07/2021) 90 mL 12   sodium chloride flush (NS) 0.9 % SOLN 10-40 mLs by Intracatheter route as needed (flush). (Patient not taking: Reported on 11/07/2021) 200 mL 5   thiamine (VITAMIN B1) 100 MG tablet Place 1 tablet (100 mg total) into feeding tube daily. (Patient not taking: Reported on 11/07/2021) 90 tablet 2   Zinc Oxide (TRIPLE PASTE) 12.8 % ointment Apply topically as needed for irritation. Apply to trach breakdown site (Patient not taking: Reported on 11/07/2021) 227 g 2   No current facility-administered medications on file prior to visit.   Review of Systems: ROS negative except for what is noted on the assessment and plan.  Vitals:   12/12/21 0929  BP: (!) 119/98  Pulse: 96  Temp: 98.1 F (36.7 C)  TempSrc: Oral  SpO2: 100%  Weight: 95 lb 9.6 oz (43.4 kg)   Physical Exam: Constitutional: ill-appearing male, in pain and uncomfortable sitting in chair HENT: normocephalic atraumatic Neck: supple Abdominal: soft, non-distended, tender around PEG tube site with drainage from PEG tube, surrounding skin of PEG tube is erythematous MSK: normal bulk and tone Neurological: alert & oriented x 3 Skin: warm and dry Psych: tearful affect  Assessment & Plan:   Leaking PEG tube [facility]) Patient presents with concern of leaking PEG tube. Sunday he reports his belt tugged on the tube. Saw general surgery the next day and they applied silver nitrate over hypergranulation to help with drainage and irritation. Last night he endorses severe pain and increased drainage around site.   He has not been using the PEG tube for past 4-5 days. He is able to tolerate oral intake. Some throat soreness and discomfort from dental caries. Weight has been improving past few months.   Exam today showed silver nitrate application and drainage at PEG site. Some surrounding erythema at site. Patient reports severe pain  and feels like PEG tube is going to popped out. During the visit, patient had removed PEG tube himself when we were contacting for possible admission and retrieving supplies. He was given lidocaine 2% jelly and one dose of oxycodone 10 mg with some relief. Provided wound care and discussed with patient about urgent referral to ostomy clinic. Patient and mother voices understanding.   He was feeling better after completing wound care but as he left the room he complained of sharp pains at PEG site. Requested additional pain medication. Discussed with him and his mother that if pain is severe then his other option is to be seen in the emergency department.   Plan -urgent referral to ostomy clinic, appears appointment scheduled for 11/17 -continue following with general surgery -sent oxycodone 10 mg (3 tablets, no refills) to pharmacy for pain  [place] Patient originally presenting today for dental surgery clearance. Denies any chest pain  or dyspnea. Able to climb a few flights of stairs with any shortness of breath. 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 Does not have cardiologist and previously did not want one. After discussion with him and his mother, we will resend referral to cardiology. Also, most of the visit today involved his concern for PEG tube leaking.   Patient seen with Dr. Andris Baumann, D.O. [facility], PGY-1 Phone: [phone] Date 12/13/2021 Time 9:41 AM

## 2021-12-14 ENCOUNTER — Ambulatory Visit (HOSPITAL_COMMUNITY)
Admission: RE | Admit: 2021-12-14 | Discharge: 2021-12-14 | Disposition: A | Discharge status: Home / Self Care | Payer: Medicaid Other | Source: Ambulatory Visit | Attending: Nurse Practitioner | Admitting: Nurse Practitioner

## 2021-12-14 ENCOUNTER — Encounter: Payer: Self-pay | Admitting: Nurse Practitioner

## 2021-12-14 ENCOUNTER — Inpatient Hospital Stay: Payer: Medicaid Other | Attending: Radiation Oncology | Admitting: Nurse Practitioner

## 2021-12-14 VITALS — BP 112/87 | HR 85 | Temp 98.2°F | Resp 18 | Wt 91.3 lb

## 2021-12-14 DIAGNOSIS — G893 Neoplasm related pain (acute) (chronic): Secondary | ICD-10-CM | POA: Diagnosis not present

## 2021-12-14 DIAGNOSIS — L24B1 Irritant contact dermatitis related to digestive stoma or fistula: Secondary | ICD-10-CM | POA: Diagnosis not present

## 2021-12-14 DIAGNOSIS — C329 Malignant neoplasm of larynx, unspecified: Secondary | ICD-10-CM | POA: Diagnosis not present

## 2021-12-14 DIAGNOSIS — K9429 Other complications of gastrostomy: Secondary | ICD-10-CM

## 2021-12-14 DIAGNOSIS — C328 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: Secondary | ICD-10-CM | POA: Diagnosis not present

## 2021-12-14 DIAGNOSIS — Z515 Encounter for palliative care: Secondary | ICD-10-CM

## 2021-12-14 DIAGNOSIS — K9423 Gastrostomy malfunction: Secondary | ICD-10-CM

## 2021-12-14 DIAGNOSIS — Z931 Gastrostomy status: Secondary | ICD-10-CM | POA: Diagnosis present

## 2021-12-14 MED ORDER — OXYCODONE HCL 10 MG PO TABS: 10.0000 mg | ORAL_TABLET | Freq: Four times a day (QID) | ORAL | 0 refills | Status: DC

## 2021-12-14 MED ORDER — LIDOCAINE 5 % EX PTCH: 1.0000 | MEDICATED_PATCH | CUTANEOUS | 2 refills | Status: DC

## 2021-12-14 MED ORDER — PREGABALIN 20 MG/ML PO SOLN: 20.0000 mg | Freq: Three times a day (TID) | ORAL | 1 refills | Status: DC

## 2021-12-14 NOTE — Progress Notes (Signed)
[place] [facility]  Telephone:([phone]) [phone] Fax:(336) [phone]   Name: David [name] Date: 12/14/2021 MRN: [id number]  DOB: 1975/02/05  Patient Care Team: Delene Ruffini, MD as PCP - General (Internal Medicine) [name], [name], RN as Oncology Nurse Navigator [name], [name], DO as Consulting Physician (Otolaryngology) [name], MD as Consulting Physician (Radiation Oncology) [name], MD as Consulting Physician (Hematology and Oncology) Pickenpack-Cousar, Carlena Sax, NP as Nurse Practitioner (Nurse Practitioner)    REASON FOR CONSULTATION: David [name] is a 46 y.o. 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 He was recently hospitalized for several months at [facility] where he received treatment for fungal endocarditis and required emergent tracheostomy and PEG tube. During hospitalization CT of chest showed concerns for residual vs recurrent tumor. Palliative requested to re-engage for ongoing support and goals of care.    SOCIAL HISTORY:     reports that he has been smoking cigarettes. He has been smoking an average of 3 packs per day. He has never used smokeless tobacco. He reports that he does not currently use alcohol after a past usage of about 12.0 standard drinks of alcohol per week. He reports that he does not currently use drugs after having used the following drugs: IV.  ADVANCE DIRECTIVES:  Patient reports he does not have advanced directives.  Would like to further discuss in the future.  CODE STATUS: Full code  PAST MEDICAL HISTORY: Past Medical History:  Diagnosis Date   Acute respiratory failure with hypoxia (HCC)    Aspiration pneumonia of right lower lobe (HCC) 06/06/2021    Bacterial endocarditis    Chronic HFrEF (heart failure with reduced ejection fraction) ([place]) 06/09/2021   Community acquired pneumonia due to Pneumococcus ([place]) 04/01/2021   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[place])    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 ([place])    Pneumococcal bacteremia 06/09/2021   Pneumothorax    Left lung spontaneous pneumothorax at age 10 yr     PAST SURGICAL HISTORY:  Past Surgical History:  Procedure Laterality Date   chest tubes Left    IR GASTROSTOMY TUBE MOD SED  06/18/2021   LAPAROSCOPIC INSERTION GASTROSTOMY TUBE N/A 06/20/2021   Procedure: LAPAROSCOPIC INSERTION GASTROSTOMY TUBE;  Surgeon: Clovis Riley, MD;  Location: [place];  Service: General;  Laterality: N/A;  DOW PATIENT   TRACHEOSTOMY TUBE PLACEMENT N/A 06/09/2021   Procedure: AWAKE TRACHEOSTOMY;  Surgeon: Melida Quitter, MD;  Location: [place];  Service: ENT;  Laterality: N/A;      ALLERGIES:  has No Known Allergies.  MEDICATIONS:  Current Outpatient Medications  Medication Sig Dispense Refill   chlorhexidine gluconate, MEDLINE KIT, (PERIDEX) 0.12 % solution Rinse  mouth with 15 mLs 2 (two) times daily. Do not eat or drink for 30 minutes. (Patient not taking: Reported on 11/07/2021) 473 mL 0   diclofenac Sodium (VOLTAREN) 1 % GEL Apply 2 g topically 4 (four) times daily as needed (pain). (Patient not taking: Reported on 11/07/2021) 100 g 2   DULoxetine (CYMBALTA) 60 MG capsule Take 1 capsule (60 mg total) by mouth daily. (Patient not taking: Reported on 11/07/2021) 90 capsule 2   fluconazole (DIFLUCAN) 200 MG tablet Take 4 tablets (800 mg total) by mouth daily. 858 tablet 3   folic acid (FOLVITE) 1 MG tablet Take 1 tablet (1 mg total) by  mouth daily. (Patient not taking: Reported on 11/07/2021) 90 tablet 2   guaiFENesin (MUCINEX) 600 MG 12 hr tablet Take 2 tablets (1,200 mg total) by mouth 2 (two) times daily. (Patient not taking: Reported on 11/07/2021) 60 tablet 1   hydrOXYzine (ATARAX) 25 MG tablet Place 4  tablets (100 mg total) into feeding tube 3 (three) times daily as needed for anxiety. (Patient not taking: Reported on 11/07/2021) 60 tablet 5   lidocaine (LIDODERM) 5 % Place 1 patch onto the skin daily. Remove & Discard patch within 12 hours or as directed by MD 30 patch 2   lidocaine (XYLOCAINE) 2 % solution Patient: Mix 1part 2% viscous lidocaine, 1part H20. Swish and spit 81m of diluted mixture up to eight times a day, prn soreness (Patient not taking: Reported on 11/07/2021) 200 mL 5   melatonin 3 MG TABS tablet Take 1 tablet (3 mg total) by mouth at bedtime as needed. (Patient not taking: Reported on 11/07/2021) 90 tablet 2   methadone (DOLOPHINE) 10 MG/ML solution Take 6 mLs (60 mg total) by mouth daily. (Patient taking differently: Take 90 mg by mouth daily.) 90 mL 0   Multiple Vitamin (MULTIVITAMIN WITH MINERALS) TABS tablet Place 1 tablet into feeding tube daily. (Patient not taking: Reported on 11/07/2021) 90 tablet 2   nicotine (NICODERM CQ - DOSED IN MG/24 HOURS) 14 mg/24hr patch Place 1 patch (14 mg total) onto the skin daily. Apply 21 mg patch daily x 6 wk, then 124mpatch daily x 2 wk, then 7 mg patch daily x 2 wk (Patient not taking: Reported on 06/06/2021) 14 patch 0   ondansetron (ZOFRAN) 8 MG tablet Take 1 tablet (8 mg total) by mouth every 8 (eight) hours as needed for nausea or vomiting. Starting day 3 after chemotherapy (Patient not taking: Reported on 11/07/2021) 30 tablet 2   Oxycodone HCl 10 MG TABS Take 1 tablet (10 mg total) by mouth every 6 (six) hours. 30 tablet 0   pantoprazole (PROTONIX) 40 MG tablet Take 1 tablet (40 mg total) by mouth daily. (Patient not taking: Reported on 11/07/2021) 90 tablet 3   phenol (CHLORASEPTIC) 1.4 % LIQD Use as directed 1 spray in the mouth or throat as needed for throat irritation / pain. (Patient not taking: Reported on 11/07/2021) 177 mL 5   polyethylene glycol powder (GLYCOLAX/MIRALAX) 17 GM/SCOOP powder Place 17 g in water and drink  daily. (Patient not taking: Reported on 11/07/2021) 238 g 3   Pregabalin 20 MG/ML SOLN Take 20 mg by mouth 3 (three) times daily. 60 mL 1   sodium chloride 0.9 % nebulizer solution Take 3 mLs by nebulization every 8 (eight) hours as needed (Secretions). (Patient not taking: Reported on 11/07/2021) 90 mL 12   sodium chloride flush (NS) 0.9 % SOLN 10-40 mLs by Intracatheter route as needed (flush). (Patient not taking: Reported on 11/07/2021) 200 mL 5   thiamine (VITAMIN B1) 100 MG tablet Place 1 tablet (100 mg total) into feeding tube daily. (Patient not taking: Reported on 11/07/2021) 90 tablet 2   Zinc Oxide (TRIPLE PASTE) 12.8 % ointment Apply topically as needed for irritation. Apply to trach breakdown site (Patient not taking: Reported on 11/07/2021) 227 g 2   No current facility-administered medications for this visit.    VITAL SIGNS: BP 112/87 (BP Location: Left Arm, Patient Position: Sitting)   Pulse 85   Temp 98.2 F (36.8 C) (Oral)   Resp 18   Wt 91 lb 4.8 oz (41.4  kg)   SpO2 100%   BMI 12.73 kg/m  Filed Weights   12/14/21 1220  Weight: 91 lb 4.8 oz (41.4 kg)      Estimated body mass index is 12.73 kg/m as calculated from the following:   Height as of an earlier encounter on 12/14/21: _0  (1.803 m).   Weight as of this encounter: 91 lb 4.8 oz (41.4 kg).  PERFORMANCE STATUS (ECOG) : 1 - Symptomatic but completely ambulatory  Physical Exam General: NAD, cachectic Cardiovascular: regular rate and rhythm Pulmonary: clear ant fields, normal breathing pattern, tracheostomy midline, Passey muir valve  Abdomen: soft, tender, + bowel sounds, PEG insertion site draining, skin irritation around site, dressing saturated Extremities: no edema, no joint deformities Neurological: AAOx3, mood appropriate          IMPRESSION:  David [name] presents to the clinic today after calling in significant pain. His mother is tearful expressing his distress around recent PEG tube  dislodgement and skin irritation. David [name] is sitting upright. Shaking and tearful complaining of severe pain. He was recently seen by the surgeon and also at the wound center for gauze dressing was applied as well as packing.  Unfortunately while in the office site began draining.  Patient grimacing and crying due to burning around site and skin area.  Guarded and tender to touch.  Packing has pushed itself out due to the amount of drainage flowing.  [place], RN cleansed area, skin protectant applied, and guaze dressing.   States he has been able to eat and drink by mouth without difficulty.  No plans to have PEG replaced.  Education provided on keeping site clean and follow-up. He knows to seek medical attention if worsen.   We discussed medications. We will prescribe oxycodone to support acute pain. He understands this is short-term support. He is actively involved with [facility] for daily administration of his methadone. Increase lyrica to three times daily.   We will continue to closely monitor.   PLAN:  Emotional support. Lidocaine patch to back  Pregabalin 20 mg 3 times daily  Oxycodone 10 mg every 6 hours as needed.  Education provided on taking medication responsibly. I will plan to see patient back in the clinic in 4-6 weeks in collaboration with his other oncology appointments.   Patient and mother expressed understanding and was in agreement with this plan. He also understands that He can call the clinic at any time with any questions, concerns, or complaints.      Any controlled substances utilized were prescribed in the context of palliative care. PDMP has been reviewed.    Time Total: 50 min   Visit consisted of counseling and education dealing with the complex and emotionally intense issues of symptom management and palliative care in the setting of serious and potentially life-threatening illness.Greater than 50%  of this time was spent counseling and coordinating care related  to the above assessment and plan.  Alda Lea, AGPCNP-BC  Palliative Medicine Team/[place]

## 2021-12-14 NOTE — Progress Notes (Signed)
[facility]   Reason for visit:  Feeding tube dislodged earlier this week at MD office.  Skin remains irritated as effluent is constantly leaking when patient changes position.   HPI:  Laryngeal malignancy with PEG tube ROS  Review of Systems  Constitutional:  Positive for activity change and fatigue.       Pain due to skin irritation  Respiratory:         Trach in place  Gastrointestinal:        Leaking PEG tube site.  Tube removed earlier in the week.   Skin:  Positive for rash and wound.       PEG tube site has not closed.  Leaks intermittently  Peristomal breakdown  Psychiatric/Behavioral:  Positive for dysphoric mood.        Pain due to skin irritation. Requesting additional analgesia and told he would need to get this from PCP or oncology.   All other systems reviewed and are negative.  Vital signs:  BP 109/81   Pulse 81   Temp 98.5 F (36.9 C) (Oral)   Resp 18   Ht '5\' 11"'$  (1.803 m)   Wt 44.5 kg   SpO2 100%   BMI 13.67 kg/m  Exam:  Physical Exam Constitutional:      Comments: underweight  HENT:     Mouth/Throat:     Mouth: Mucous membranes are moist.     Comments: TRACH in place  with collar Abdominal:     Comments: LLQ PEG tube site with irritation  Skin:    Findings: Lesion and rash present.  Neurological:     Mental Status: He is alert and oriented to person, place, and time.  Psychiatric:     Comments: Anxious due to pain and leaking effluent     Stoma type/location:  LLQ PEG tube site. Tube has been dislodged.  Stomal assessment/size:  0.5 cm round Peristomal assessment:  full thickness tissue loss to peristomal skin from constant leakage of effluent. Was told in MD office this site would close on its own.   Treatment options for stomal/peristomal skin: Area is gently cleansed with soap and water and pat dry.  I gently fill the opening with silver hydrofiber. This super soaker dressing may delay frequency of dressing changes but they will  still be frequent at least initially.  They verbalize understanding.  The denuded skin is treated with calmoseptine ointment to promote healing.  I provide them with 5 sheets of aquacel and demonstrate cutting into strips and packing into opening.  I cover with Drawtex and tape.  Patient states it feels like it is leaking already. I do not see effluent but explain this was likely to  happen and if he noticed strikethrough, to change the dressing. This is likely to lessen over time after tube has been removed.  Output: Liquid green effluent  Education provided:  see above Mother is trying to arrange appointment with oncology for additional analgesia.     Impression/dx  Contact dermatitis from exposure to leaking G tube site.  Discussion  See above Plan  New wound care orders    Visit time: 55 minutes.   Domenic Moras FNP-BC

## 2021-12-19 ENCOUNTER — Encounter: Payer: Self-pay | Admitting: Internal Medicine

## 2021-12-19 ENCOUNTER — Ambulatory Visit: Payer: Medicaid Other | Attending: Internal Medicine | Admitting: Internal Medicine

## 2021-12-19 VITALS — BP 64/48 | HR 57 | Ht 71.0 in | Wt 94.8 lb

## 2021-12-19 DIAGNOSIS — I5020 Unspecified systolic (congestive) heart failure: Secondary | ICD-10-CM | POA: Diagnosis present

## 2021-12-19 MED ORDER — MIDODRINE HCL 5 MG PO TABS: 5.0000 mg | ORAL_TABLET | Freq: Three times a day (TID) | ORAL | 6 refills | Status: DC

## 2021-12-19 NOTE — Patient Instructions (Signed)
Medication Instructions:  Start Midodrine 5 mg three times a day Continue all other medications    Lab Work: None ordered   Testing/Procedures: None ordered   Follow-Up: At [facility], you and your health needs are our priority.  As part of our continuing mission to provide you with exceptional heart care, we have created designated Provider Care Teams.  These Care Teams include your primary Cardiologist (physician) and Advanced Practice Providers (APPs -  Physician Assistants and Nurse Practitioners) who all work together to provide you with the care you need, when you need it.  We recommend signing up for the patient portal called "MyChart".  Sign up information is provided on this After Visit Summary.  MyChart is used to connect with patients for Virtual Visits (Telemedicine).  Patients are able to view lab/test results, encounter notes, upcoming appointments, etc.  Non-urgent messages can be sent to your provider as well.   To learn more about what you can do with MyChart, go to [email or web address].    Your next appointment:  6 months   Call in Feb to schedule May appointment    The format for your next appointment: Office    Provider:  [place]

## 2021-12-19 NOTE — Progress Notes (Signed)
Cardiology Office Note:    Date:  12/19/2021   ID:  David [name], DOB 1975/05/18, MRN [id number]  PCP:  Delene Ruffini, MD   [place] Providers Cardiologist:  None     Referring MD: Delene Ruffini, MD   No chief complaint on file. Infective Endocarditis  History of Present Illness:    David [name] is a 46 y.o. 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 He hospitalized for 77 days during 06/06/2021-08/22/2021. TTE showed a large vegetation on the tricuspid valve. He was deemed not a TEE candidate 2/2 laryngeal CA and radiation. He was not evaluated by cardiology in the hospital. He is followed by ID. He is on fluconazole high dose. He will continue this for a few more months then continue fluconazole at a lower dose.  Followed by heme/onc for his squamous cell carcinoma of the larynx post concurrent chemotherapy and radiation with cisplatin 100 mg per metered squared. He recently had a PET/CT which was concerning for possible residual disease, however a biopsy did not show any evidence of residual malignancy. Considering he does not have residual disease, he was not encouraged to continue to follow with medical oncology  Today he comes in, reports his PEG tube fell out. He can consume soft foods by mouth and take his medications. No hx of stroke. No conduction dx.    Cardiology Studies TTE 08/12/2021 EF 55-60%, large highly mobile density measuring 1.2cm x 1.1 cm on the tricuspid valve  Past Medical History:  Diagnosis Date   Acute respiratory failure with hypoxia (HCC)    Aspiration pneumonia of right lower lobe (HCC) 06/06/2021   Bacterial endocarditis    Chronic HFrEF (heart failure with reduced ejection fraction) ([place])  06/09/2021   Community acquired pneumonia due to Pneumococcus ([place]) 04/01/2021   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[place])    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 ([place])    Pneumococcal bacteremia 06/09/2021   Pneumothorax    Left lung spontaneous pneumothorax at age 74 yr     Past Surgical History:  Procedure Laterality Date   chest tubes Left    IR GASTROSTOMY TUBE MOD SED  06/18/2021   LAPAROSCOPIC INSERTION GASTROSTOMY TUBE N/A 06/20/2021   Procedure: LAPAROSCOPIC INSERTION GASTROSTOMY TUBE;  Surgeon: Clovis Riley, MD;  Location: [place];  Service: General;  Laterality: N/A;  DOW PATIENT   TRACHEOSTOMY TUBE PLACEMENT N/A 06/09/2021   Procedure: AWAKE TRACHEOSTOMY;  Surgeon: Melida Quitter, MD;  Location: [facility];  Service: ENT;  Laterality: N/A;    Current Medications: Current Meds  Medication Sig   fluconazole (DIFLUCAN) 200 MG tablet Take 4 tablets (800 mg total) by mouth daily.   guaiFENesin (MUCINEX) 600 MG 12 hr tablet Take 2 tablets (1,200 mg total) by mouth 2 (two) times daily.   hydrOXYzine (ATARAX) 25 MG tablet Place 4 tablets (100 mg total) into feeding tube 3 (three) times daily as needed for anxiety.   lidocaine (LIDODERM) 5 % Place 1 patch onto the skin daily. Remove & Discard patch within 12 hours or as directed by MD   lidocaine (XYLOCAINE) 2 % solution Patient: Mix 1part 2% viscous lidocaine, 1part H20. Swish and spit 26m of diluted mixture up to eight times a day, prn soreness   melatonin 3 MG TABS tablet Take 1 tablet (3 mg total)  by mouth at bedtime as needed.   methadone (DOLOPHINE) 10 MG/ML solution Take 6 mLs (60 mg total) by mouth daily. (Patient taking differently: Take 90 mg by mouth daily.)   Multiple Vitamin (MULTIVITAMIN WITH MINERALS) TABS tablet Place 1 tablet into feeding tube daily.   nicotine (NICODERM CQ - DOSED IN MG/24 HOURS) 14 mg/24hr patch Place 1 patch (14 mg total) onto the skin daily. Apply 21 mg patch daily x 6 wk, then '14mg'$  patch daily x 2 wk, then 7  mg patch daily x 2 wk   ondansetron (ZOFRAN) 8 MG tablet Take 1 tablet (8 mg total) by mouth every 8 (eight) hours as needed for nausea or vomiting. Starting day 3 after chemotherapy   Oxycodone HCl 10 MG TABS Take 1 tablet (10 mg total) by mouth every 6 (six) hours.   pantoprazole (PROTONIX) 40 MG tablet Take 1 tablet (40 mg total) by mouth daily.   phenol (CHLORASEPTIC) 1.4 % LIQD Use as directed 1 spray in the mouth or throat as needed for throat irritation / pain.   polyethylene glycol powder (GLYCOLAX/MIRALAX) 17 GM/SCOOP powder Place 17 g in water and drink daily.   Pregabalin 20 MG/ML SOLN Take 20 mg by mouth 3 (three) times daily.   thiamine (VITAMIN B1) 100 MG tablet Place 1 tablet (100 mg total) into feeding tube daily.   Zinc Oxide (TRIPLE PASTE) 12.8 % ointment Apply topically as needed for irritation. Apply to trach breakdown site   [DISCONTINUED] midodrine (PROAMATINE) 5 MG tablet Take 1 tablet (5 mg total) by mouth 3 (three) times daily with meals.     Allergies:   Patient has no known allergies.   Social History   Socioeconomic History   Marital status: Divorced    Spouse name: Not on file   Number of children: Not on file   Years of education: Not on file   Highest education level: Not on file  Occupational History   Not on file  Tobacco Use   Smoking status: Every Day    Packs/day: 3.00    Types: Cigarettes   Smokeless tobacco: Never   Tobacco comments:    Patient denied tobacco use  Vaping Use   Vaping Use: Never used  Substance and Sexual Activity   Alcohol use: Not Currently    Alcohol/week: 12.0 standard drinks of alcohol    Types: 12 Cans of beer per week   Drug use: Not Currently    Types: IV    Comment: 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)   Sexual activity: Not on file  Other Topics Concern   Not on file  Social History Narrative   Not on file   Social Determinants of Health   Financial Resource Strain: Not on file  Food  Insecurity: Not on file  Transportation Needs: Not on file  Physical Activity: Not on file  Stress: Not on file  Social Connections: Not on file     Family History: NA  ROS:   Please see the history of present illness.     All other systems reviewed and are negative.  EKGs/Labs/Other Studies Reviewed:    The following studies were reviewed today:   EKG:  EKG is  ordered today.  The ekg ordered today demonstrates   12/18/2021- sinus bradycardia 57 bpm, Qtc 490 ms  Recent Labs: 04/01/2021: B Natriuretic Peptide 276.6 06/25/2021: Magnesium 1.8 11/07/2021: ALT 18; BUN 12; Creat 0.62; Hemoglobin 11.6; Platelets 241; Potassium 3.7; Sodium 139 11/22/2021:  TSH 5.244  Recent Lipid Panel    Component Value Date/Time   CHOL 98 04/01/2021 0612   TRIG 40 04/01/2021 0612   HDL 35 (L) 04/01/2021 0612   CHOLHDL 2.8 04/01/2021 0612   VLDL 8 04/01/2021 0612   LDLCALC 55 04/01/2021 0612     Risk Assessment/Calculations:     Physical Exam:    VS:  Vitals:   12/19/21 0822  BP: (!) 64/48  Pulse: (!) 57  SpO2: 99%     Wt Readings from Last 3 Encounters:  12/19/21 94 lb 12.8 oz (43 kg)  12/14/21 98 lb (44.5 kg)  12/14/21 91 lb 4.8 oz (41.4 kg)     GEN: Malnourished, emaciated, Well nourished, well developed in no acute distress HEENT: Normal NECK: No JVD; No carotid bruits LYMPHATICS: No lymphadenopathy CARDIAC: RRR, no murmurs, rubs, gallops RESPIRATORY:  Clear to auscultation without rales, wheezing or rhonchi  ABDOMEN: Soft, non-tender, non-distended MUSCULOSKELETAL:  No edema; No deformity  SKIN: Warm and dry NEUROLOGIC:  Alert and oriented x 3 PSYCHIATRIC:  explosive  ASSESSMENT:    [place] IE: he is not to a surgical candidate considering his multiple co-morbidities and FTT. His TTE windows are challenging, but he does not have significant TR. There is no further work up from a cardiac perspective that I would recommend at this time. Can follow periodically.    Hypotensive: typically nl. He denies signs of infection. He took his methadone which may contribute.  PreOp: he tolerated anesthesia at [facility] recently per patient. His EF is normal and his valve is not compromised. His teeth extraction is important for reducing his risk of recurrent infection. Considering the risks and benefits, would consider him acceptable from a cardiac perspective for anesthesia.   PLAN:    In order of problems listed above:  Midodrine 5 mg TID Follow up 6 months      Medication Adjustments/Labs and Tests Ordered: Current medicines are reviewed at length with the patient today.  Concerns regarding medicines are outlined above.  Orders Placed This Encounter  Procedures   EKG 12-Lead   Meds ordered this encounter  Medications   DISCONTD: midodrine (PROAMATINE) 5 MG tablet    Sig: Take 1 tablet (5 mg total) by mouth 3 (three) times daily with meals.    Dispense:  100 tablet    Refill:  6   midodrine (PROAMATINE) 5 MG tablet    Sig: Take 1 tablet (5 mg total) by mouth 3 (three) times daily with meals.    Dispense:  100 tablet    Refill:  6    Patient Instructions  Medication Instructions:  Start Midodrine 5 mg three times a day Continue all other medications    Lab Work: None ordered   Testing/Procedures: None ordered   Follow-Up: At [facility], you and your health needs are our priority.  As part of our continuing mission to provide you with exceptional heart care, we have created designated Provider Care Teams.  These Care Teams include your primary Cardiologist (physician) and Advanced Practice Providers (APPs -  Physician Assistants and Nurse Practitioners) who all work together to provide you with the care you need, when you need it.  We recommend signing up for the patient portal called "MyChart".  Sign up information is provided on this After Visit Summary.  MyChart is used to connect with patients for Virtual Visits  (Telemedicine).  Patients are able to view lab/test results, encounter notes, upcoming appointments, etc.  Non-urgent messages  can be sent to your provider as well.   To learn more about what you can do with MyChart, go to [email or web address].    Your next appointment:  6 months   Call in Feb to schedule May appointment    The format for your next appointment: Office    Provider:  Dr.Khyleigh Furney   Important Information About Sugar         Signed, [name], MD  12/19/2021 9:22 AM    [place]

## 2021-12-25 DIAGNOSIS — L24B1 Irritant contact dermatitis related to digestive stoma or fistula: Secondary | ICD-10-CM | POA: Insufficient documentation

## 2021-12-25 DIAGNOSIS — K9429 Other complications of gastrostomy: Secondary | ICD-10-CM | POA: Insufficient documentation

## 2021-12-25 NOTE — Discharge Instructions (Signed)
Wound care:  Aquacel Ag strip to opening Cover with calmoseptine ointment, drawtex and tape if needed.  Change when leaks

## 2021-12-26 ENCOUNTER — Ambulatory Visit (INDEPENDENT_AMBULATORY_CARE_PROVIDER_SITE_OTHER): Payer: Medicaid Other | Admitting: Student

## 2021-12-26 ENCOUNTER — Encounter: Payer: Self-pay | Admitting: Student

## 2021-12-26 ENCOUNTER — Other Ambulatory Visit: Payer: Self-pay

## 2021-12-26 VITALS — BP 85/60 | HR 59 | Temp 98.2°F | Wt 94.4 lb

## 2021-12-26 DIAGNOSIS — E43 Unspecified severe protein-calorie malnutrition: Secondary | ICD-10-CM

## 2021-12-26 DIAGNOSIS — K9423 Gastrostomy malfunction: Secondary | ICD-10-CM | POA: Diagnosis not present

## 2021-12-26 DIAGNOSIS — K029 Dental caries, unspecified: Secondary | ICD-10-CM | POA: Diagnosis not present

## 2021-12-26 DIAGNOSIS — I959 Hypotension, unspecified: Secondary | ICD-10-CM | POA: Diagnosis not present

## 2021-12-26 MED ORDER — ZINC OXIDE 10 % EX CREA: 1.0000 | TOPICAL_CREAM | Freq: Two times a day (BID) | CUTANEOUS | 0 refills | Status: DC

## 2021-12-26 NOTE — Patient Instructions (Addendum)
You are able to proceed with your surgery.   You can use desitin for the area around your peg tube site. Please cleanse this area with warm soap and water twice a day.   Please continue to take midodrine for your blood pressure.

## 2021-12-27 NOTE — Progress Notes (Signed)
David [name] was seen together with Dr. Markus Jarvis.  The severe PEG pain in retrospect was related to the bulb being externally dislodged, and there was immediate relief when it did pop out. There is now PEG drainage leading to contact of luminal contents against his raw peri-ostomy skin, causing significant ongoing pain.  Contacted surgery clinic for advice - they typically send their ostomy care tissues o the ostomy clinic, which was closed today.  We dressed the ostomy site as best we could, explaining to [name] and his mother that there would be eventual cessation of the drainage as the wound heals, and that frequent dressing changes would be needed until then.  F/u next day in surgery clinic for additional advice.  Note we have no plan to replace the PEG; he has resumed oral nutrition and hydration.

## 2021-12-30 NOTE — Assessment & Plan Note (Signed)
Patient was recently cleared by his cardiologist to undergo surgery for his multiple dental caries under anesthesia. Patient does continue to smoke but is not currently interested in cessation. Given the potential for recurrent infections from his dental caries agree the benefits outweigh the risks for patient to undergo teeth extraction. He is a class II risk and has about a 6% 30d risk of MI, death, or cardiac arrest.

## 2021-12-30 NOTE — Progress Notes (Signed)
   CC: F/u re clearance for teeth extraction   HPI:  [name] is a 46 y.o. M with PMH per below who presents for clearance regarding teeth extraction.   Patient recently seen by cardiologist who cleared patient to undergo surgery for his teeth extraction. At that clinic visit it was noted that he chronically is hypotensive and he was started on midodrine '5mg'$  TID. Unfortunately he has not been able to take these. In addition, patient was seen in our clinic prior to his clinic visit and was noted to have singificant drainage from around his peg tube site.   Since his clinic visit he states the drainage from around his PEG tube site as significantly decreased. He declined to have the tube replaced as he would feels he is able to take more in by mouth. He does state the burning pain he experienced at the site of his peg tube insertion has also significantly improved now that the drainage has stopped.   He denies chest pain, SOB, abdominal pain.   Past Medical History:  Diagnosis Date   Acute respiratory failure with hypoxia (HCC)    Aspiration pneumonia of right lower lobe (HCC) 06/06/2021   Bacterial endocarditis    Chronic HFrEF (heart failure with reduced ejection fraction) ([place]) 06/09/2021   Community acquired pneumonia due to Pneumococcus ([place]) 04/01/2021   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[place])    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 ([place])    Pneumococcal bacteremia 06/09/2021   Pneumothorax    Left lung spontaneous pneumothorax at age 47 yr    Review of Systems:  Negative except per above.   Physical Exam:  Vitals:   12/26/21 1513 12/26/21 1514  BP:  (!) 85/60  Pulse:  (!) 59  Temp:  98.2 F (36.8 C)  TempSrc:  Oral  SpO2:  93%  Weight: 94 lb 6.4 oz (42.8 kg)      Constitutional: Thin, chronically ill appearing, and in no distress.  Neck: Trach in place with passymuir valve. No surrounding erythema or drainage.   Cardiovascular: Normal rate, regular rhythm, intact  distal pulses. No gallop and no friction rub.  No murmur heard. No lower extremity edema  Pulmonary: Non labored breathing on room air, no wheezing or rales  Abdominal: Soft. Normal bowel sounds. Non distended and non tender. PEG tube site surrounding skin macerated with superficial skin breakdown. Mildly erythematous, no increased warmth to touch. No purulent drainage.   Musculoskeletal: Normal range of motion.        General: No tenderness or edema.  Neurological: Alert and oriented to person, place, and time. Non focal  Skin: Skin is warm and dry.    Assessment & Plan:   See Encounters Tab for problem based charting.  Patient discussed with Dr. Heber Mylo

## 2021-12-30 NOTE — Assessment & Plan Note (Signed)
Patient previously with PEG but did not want it replaced after it was accidentally dislodged. He notes that he will continue to try and meet his caloric needs with PO intake only.

## 2021-12-30 NOTE — Assessment & Plan Note (Signed)
This is a chronic problem for patient. He is severely malnourished.   In clinic his vitals are:     12/26/2021    3:14 PM 12/26/2021    3:13 PM 12/19/2021    8:22 AM  Vitals with BMI  Height   '5\' 11"'$   Weight  94 lbs 6 oz 94 lbs 13 oz  BMI  29.29 09.03  Systolic 85  64  Diastolic 60  48  Pulse 59  57  He is asymptomatic. He is able to ambulated with out dizziness. He was prescribed midodrine by his cardiologist and states he has been taking this intermittently. He will take his.

## 2021-12-31 DIAGNOSIS — K0530 Chronic periodontitis, unspecified: Secondary | ICD-10-CM | POA: Insufficient documentation

## 2021-12-31 DIAGNOSIS — K0850 Unsatisfactory restoration of tooth, unspecified: Secondary | ICD-10-CM | POA: Insufficient documentation

## 2021-12-31 DIAGNOSIS — F40232 Fear of other medical care: Secondary | ICD-10-CM | POA: Insufficient documentation

## 2021-12-31 DIAGNOSIS — K011 Impacted teeth: Secondary | ICD-10-CM | POA: Insufficient documentation

## 2022-01-01 ENCOUNTER — Encounter: Payer: Medicaid Other | Admitting: Nurse Practitioner

## 2022-01-01 ENCOUNTER — Telehealth: Payer: Self-pay

## 2022-01-01 NOTE — Progress Notes (Signed)
Internal Medicine Clinic Attending  Case discussed with the resident at the time of the visit.  We reviewed the resident's history and exam and pertinent patient test results.  I agree with the assessment, diagnosis, and plan of care documented in the resident's note.  

## 2022-01-01 NOTE — Telephone Encounter (Signed)
Pt mother called ot r/s today's appt, schedule changed and confirmed with pt mother, no further needs at this time.

## 2022-01-02 NOTE — Progress Notes (Signed)
Mr Munn is here for a follow -up appointment today with Dr. Isidore Moos.Completed radiation to the larynx on 02-09-2021.   Pain issues, if any: tongue pain, denies throat pain but you feels like something is stuck Using a feeding tube?: pulled on in md office Weight changes, if any: lost weight, varies Wt Readings from Last 3 Encounters:  01/11/22 93 lb 2 oz (42.2 kg)  01/09/22 91 lb 4.8 oz (41.4 kg)  12/26/21 94 lb 6.4 oz (42.8 kg)    Swallowing issues, if any: modifying how he eats and drinks to get food in Denotation is a problem as well, losing teeth Smoking or chewing tobacco? Current smoker, states he is unable to get nicotine patches due to money Using fluoride trays daily? no Last ENT visit was on: bx on Oct 15 then saw for follow up on 12-11-21 with Dr. Nicolette Bang, Bx was negative  Other notable issues, if any: trach issues, Dr. Kary Kos  saw him this week for that, needs refills on Viscous Lidocaine, concern for constipation   Vitals:   01/11/22 1033  BP: (!) 131/92  Pulse: 74  Resp: 18  Temp: 98.6 F (37 C)  SpO2: 100%

## 2022-01-08 ENCOUNTER — Ambulatory Visit: Payer: Medicaid Other | Attending: Radiation Oncology

## 2022-01-08 DIAGNOSIS — R1310 Dysphagia, unspecified: Secondary | ICD-10-CM | POA: Insufficient documentation

## 2022-01-08 DIAGNOSIS — R490 Dysphonia: Secondary | ICD-10-CM | POA: Diagnosis present

## 2022-01-08 NOTE — Therapy (Signed)
OUTPATIENT SPEECH LANGUAGE PATHOLOGY SWALLOW TREATMENT/  Renewal-discharge summary   Patient Name: David [name] MRN: [id number] DOB:[birth year], 46 y.o., male Today's Date: 01/08/2022  PCP: Delene Ruffini, MD REFERRING PROVIDER: [name], MD   End of Session - 01/08/22 1450     Visit Number 4    Number of Visits 4    Date for SLP Re-Evaluation 01/08/22    SLP Start Time 77    SLP Stop Time  84    SLP Time Calculation (min) 12 min    Activity Tolerance Patient tolerated treatment well                Past Medical History:  Diagnosis Date   Acute respiratory failure with hypoxia ([place])    Aspiration pneumonia of right lower lobe ([place]) 06/06/2021   Bacterial endocarditis    Chronic HFrEF (heart failure with reduced ejection fraction) ([place]) 06/09/2021   Community acquired pneumonia due to Pneumococcus ([place]) 04/01/2021   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[place])    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 ([place])    Pneumococcal bacteremia 06/09/2021   Pneumothorax    Left lung spontaneous pneumothorax at age 27 yr    Past Surgical History:  Procedure Laterality Date   chest tubes Left    IR GASTROSTOMY TUBE MOD SED  06/18/2021   LAPAROSCOPIC INSERTION GASTROSTOMY TUBE N/A 06/20/2021   Procedure: LAPAROSCOPIC INSERTION GASTROSTOMY TUBE;  Surgeon: Clovis Riley, MD;  Location: [facility] OR;  Service: General;  Laterality: N/A;  DOW PATIENT   TRACHEOSTOMY TUBE PLACEMENT N/A 06/09/2021   Procedure: AWAKE TRACHEOSTOMY;  Surgeon: Melida Quitter, MD;  Location: [facility] OR;  Service: ENT;  Laterality: N/A;   Patient Active Problem List   Diagnosis Date Noted   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   Defective dental restoration 12/31/2021   Chronic periodontitis 12/31/2021   Impacted third molar tooth 12/31/2021   Irritant contact dermatitis associated with digestive stoma 12/25/2021   Irritation around percutaneous endoscopic gastrostomy (PEG) tube site ([place]) 12/25/2021    Leaking PEG tube ([place]) 12/12/2021   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   Continuous tobacco abuse 11/21/2021   Dermatitis associated with moisture 10/16/2021   Back pain 08/16/2021   Candidal endocarditis    History of radiation to head and neck region 07/11/2021   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   Dysgeusia 07/11/2021   S/P percutaneous endoscopic gastrostomy (PEG) tube placement ([place]) 06/24/2021   Pressure injury of skin 06/17/2021   Tracheostomy status ([place])    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   Heart failure with reduced ejection fraction ([place]) 04/28/2021   Elevated troponin 04/01/2021   Elevated transaminase level 04/01/2021   Weight loss, unintentional 01/15/2021   Protein-calorie malnutrition, severe 01/10/2021   Hypotension 01/08/2021   AKI (acute kidney injury) ([place]) 01/08/2021   Chemotherapy induced nausea and vomiting 01/01/2021   Encounter for dental examination 12/13/2020   Laryngeal cancer ([place]) 12/04/2020   Teeth missing 12/04/2020   Radiation caries 12/04/2020   Retained tooth root 12/04/2020   Chronic apical periodontitis 12/04/2020   Accretions on teeth 12/04/2020    SPEECH THERAPY RENEWAL-DISCHARGE SUMMARY  Visits from Start of Care: 4  Current functional level related to goals / functional outcomes: See below. Pt will be seen today with the current goals and then will be d/c'd.    Remaining deficits: Dysphagia, hoarseness.   Education / Equipment: Late effects head/neck radiation on swallowing, HEP procedure, aspiration precautions from MBS and rationale, rationale for ST appointments, overt s/sx aspiration PNA  Patient agrees to discharge. Patient goals were partially met. Patient is being discharged due to the patient's request..     ONSET DATE: 2022   REFERRING DIAG: C32.9 (ICD-10-CM) - Laryngeal cancer  THERAPY DIAG:  Dysphagia, unspecified type  Hoarseness of voice  Rationale for Evaluation and Treatment  Rehabilitation  SUBJECTIVE:   SUBJECTIVE STATEMENT: David [name] refused to tell SLP what he had been eating and drinking in the last 48 hours.  Pt accompanied by: self  PERTINENT HISTORY: Pt seen for course of ST with limited/poor compliance in the past. 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  This condition required placement of tracheostomy and gastrostomy tubes.  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  They were discharged from [facility] on hospital day 77 (08-22-21) in Stable condition. Prior rad tx to larynx and bil neck completed on 02-08-21. Now with possible recurrent disease. Consult 10-17-21 with Dr. Denman George at [facility], with results/decisions pending. PET scheduled 10-25-21 and biopsy on 11-08-21 at [facility].  LIVING ENVIRONMENT: Lives with: lives alone Lives in: House/apartment  PLOF:  Level of assistance: Independent with ADLs Employment: Other: does not work  PATIENT GOALS  Improve swallow  OBJECTIVE:   DIAGNOSTIC FINDINGS: PET scheduled for 10-25-21 at [facility].  RECOMMENDATIONS FROM OBJECTIVE SWALLOW STUDY (MBSS/FEES) 07/25/21:   Clinical Impression Patient presents with mild improvements in swallow function as compared to previous MBS on 5/26 however at that time he also had Cortrak feeding tube impacting swallow function. Main improvements noted during today's MBS are reduced pharyngeal residuals, improved consistency and effectiveness of using swallow strategies of head turn right during swallow and cough/throat clear and reswallow and decreased intensity of discomfort from penetration/aspiration. Patient independently used both strategies and this was effective to clear all observed penetrates which occured with nectar thick and thin liquid barium and cleared majority of aspirate. He did exhibit one instance of trace  silent aspiration of thin liquid barium during this study. When SLP asked patient is comfort level when swallowing thin liquid barium with added requirement of swallow strategies, he candidly replied, "I've been doing it already" and that when he has a liquid "like a regular coke" he has to be more careful with his swallowing. SLP is recommending to liberalize patient's diet to allow thin liquids at this time as he is demonstrating consistent and effective use of learned swallow strategies to reduce his aspiration risk. SLP will follow briefly to ensure education complete and patient comfortable with this change. Swallow Evaluation Recommendations  SLP Diet Recommendations: Regular solids;Thin liquid  Liquid Administration via: Cup  Medication Administration: Whole meds with puree  Supervision: Patient able to self feed  Compensations: Slow rate;Small sips/bites;Other (Comment);Clear throat after each swallow;Hard cough after swallow (head turn right during swallows with liquids)  Postural Changes: Seated upright at 90 degrees;Remain semi-upright after after feeds/meals (Comment) (30 minutes)  Oral Care Recommendations: Oral care BID;Patient independent with oral care  Other Recommendations: Have oral suction available               PATIENT REPORTED OUTCOME MEASURES (PROM): EAT-10: pt scored 14/40 (lower scores indicate better QOL)   TODAY'S TREATMENT:  01/08/22: See "subjective statement". David [name] arrived today without speaking valve placed. After cueing pt to place speaking valve, he asked for Ibuprofen due to "not really pain, but a pressure in my throat." SLP told pt SLP cannot provide Ibuprofen. After  telling SLP his mother takes him to the wrong doctors, he stated frustration with having to come to see ST today. SLP explained why he was seeing SLP and he said "What do we need to do so I can leave?" SLP ascertained pt was only drinking Ensure at this time due to tongue pain from broken  dentition. SLP asked David [name] to take single sips of water to simulate drinking ensure. He had cough with each sip, not following any precautions from his previous MBS -which is consistent from session to session. Because of this SLP has to assume pt is not following precautions with POs. No overt s/sx aspiration PNA were evident today. SLP has provided pt with these s/sx in previous session, which pt told SLP (}see note from 12-06-21 for details). Pt then requested d/c from [place] today telling SLP "all I want to do is watch TV and sleep" and again expressed frustration with mother taking him to MD appointments. Given patient's tone of voice with this request and with previous statements, SLP believes d/c at this time may be the best option for pt; SLP told pt if he has coughing or choking with things he eats or drinks he should contact Dr. Isidore Moos or RN navigator Anderson Malta to have Dr. Isidore Moos re-refer for ST services.   12/06/21: Microwave meals, pinwheels, dark chocolate, tuna salad. Pt reports he is gaining weight. Today pt smells heavily like cigarette smoke. HEP completed with independence. Continues to complete HEP at suboptimal level "Maybe once a week." SLP reiterated to pt to try to increase frequency and get total reps to x20/day. Pt told SLP rationale for this, when SLP palpated pt's submandibular and submental region - minimal fibrosis felt today. Pt ate two bites of cereal bar and two of applesauce and drank 5 sips water without overt s/sx aspiration, however this might not be adequate symptom of aspiration given pt's history. Pt did not follow any precautions from the [place] in June (again). SLP reviewed precautions and provided them to pt, along with overt s/sx of aspiration PNA, which pt told SLP. SLP again informed pt why following precautions was important and pt acknowledged understanding. SLP congratulated pt that his procedure for HEP was correct and that he needed to just do the exercises at least x7 at a  time with aim to get to 20 reps/day.  11/22/21: Pt did not follow any precautions from [place] from June 2023. SLP reiterated precautions. Pt stated that he was having more difficulty with some Tylenol last week. David [name] said it "stuck in my throat" and he had to use a liquid wash to clear. SLP worked with David [name] today with applesauce and tic tacs, to teach David [name] how to use applesauce with medication. Using this method, pharyngeal clearance was achieved. David [name] said that sometimes he has difficulty with food remaining in his throat after the first two swallows. SLP encouraged him to use alternating bite and sip technique, and demonstrated this during the session. David [name] demonstrated understanding. He demonstrated delayed through clear on one sip of 8 during the session. No overt signs and symptoms of aspiration pneumonia were present today.  David [name] told SLP in "s" statement he had not been completing HEP and SLP reminded pt yet again rationale for completing HEP, highlighting that aspiration may cause aspiration PNA, using visual aid of sagittal view of oral and pharyngeal cavities. Pt demonstrated understanding of rationale for HEP. Despite not completing HEP, he completed it today with independence.  10/23/21: Because he did not  follow precautions with POs, SLP reiterated rationale for these precautions. Pt stated that at one time he had been told some aspiration is considered WNL. SLP told pt that in his condition no aspiration is optimal and strongly recommended pt adhere to precautions to reduce/eliminate aspiration risk.  SLP continued by assisting pt in adhering to aspiration precautions - after 2 minutes pt refused to continue with practicing these compensations. At time of refusal he was following the precautions with approx 80% success.  SLP then reviewed pt's HEP with him and he req'd min A consistently. By end of session he was completing HEP with mod I.  PATIENT EDUCATION: Education details: See "today's  treatment" Person educated: Patient Education method: Explanation, Demonstration, Verbal cues, and Handouts Education comprehension: verbalized understanding, returned demonstration, verbal cues required, and needs further education   ASSESSMENT:  CLINICAL IMPRESSION: Patient is a 46 y.o. male who was seen today for treatment of swallowing after MBS on 07-25-21. He continues to choose to not perform the HEP at recommended frequency. Today with POs, David [name] did not follow swallow precautions from [facility] 07-25-21, and coughed with each of 3 sips of water. Pt stated he desires d/c, after SLP explanation why he needs ST.  OBJECTIVE IMPAIRMENTS include voice disorder and dysphagia. These impairments are limiting patient from safety when swallowing. Factors affecting potential to achieve goals and functional outcome are cooperation/participation level, medical prognosis, previous level of function, and severity of impairments. Patient will benefit from skilled SLP services to address above impairments and improve overall function. Consider d/c next 1-2 visits if HEP compliance does not improve and pt cont to look safe with POs.   REHAB POTENTIAL: Fair given above factors in "objective impairments".   GOALS: Goals reviewed with patient? Yes  SHORT TERM GOALS: Target date: 11/20/2021    Pt will complete HEP with rare min A Baseline: consistent min A Goal status: met  2.    Pt will follow swallow precautions with rare min A in 2 sessions Baseline: Total A Goal status: not met   LONG TERM GOALS: Target date: 01/08/22    Pt will complete HEP with modified independence in 2 sessions Baseline: Consistent min A   11-22-21 Goal status: Met  2.  Pt will follow swallow precautions with modified independence in 2 sessions Baseline: Total A Goal status: not met  3.  Pt will tell SLP three overt s/sx aspiration PNA Baseline: not provided yet Goal status: Met  PLAN: Pt desires d/c. Pt will be  d/c'd.  PLANNED INTERVENTIONS: Aspiration precaution training, Pharyngeal strengthening exercises, Diet toleration management , Environmental controls, Trials of upgraded texture/liquids, SLP instruction and feedback, Compensatory strategies, and Patient/family education    [facility], [place] 01/08/2022, 3:31 PM

## 2022-01-09 ENCOUNTER — Inpatient Hospital Stay: Payer: Medicaid Other | Attending: Radiation Oncology | Admitting: Nurse Practitioner

## 2022-01-09 ENCOUNTER — Encounter: Payer: Self-pay | Admitting: Nurse Practitioner

## 2022-01-09 ENCOUNTER — Other Ambulatory Visit: Payer: Self-pay

## 2022-01-09 ENCOUNTER — Ambulatory Visit (HOSPITAL_COMMUNITY)
Admission: RE | Admit: 2022-01-09 | Discharge: 2022-01-09 | Disposition: A | Discharge status: Home / Self Care | Payer: Medicaid Other | Source: Ambulatory Visit | Attending: Acute Care | Admitting: Acute Care

## 2022-01-09 VITALS — BP 100/60 | HR 78 | Temp 98.0°F | Resp 19 | Ht 71.0 in | Wt 91.3 lb

## 2022-01-09 DIAGNOSIS — Z515 Encounter for palliative care: Secondary | ICD-10-CM

## 2022-01-09 DIAGNOSIS — R634 Abnormal weight loss: Secondary | ICD-10-CM

## 2022-01-09 DIAGNOSIS — C329 Malignant neoplasm of larynx, unspecified: Secondary | ICD-10-CM

## 2022-01-09 DIAGNOSIS — G893 Neoplasm related pain (acute) (chronic): Secondary | ICD-10-CM

## 2022-01-09 DIAGNOSIS — K9423 Gastrostomy malfunction: Secondary | ICD-10-CM

## 2022-01-09 DIAGNOSIS — Z930 Tracheostomy status: Secondary | ICD-10-CM | POA: Diagnosis not present

## 2022-01-09 DIAGNOSIS — R630 Anorexia: Secondary | ICD-10-CM

## 2022-01-09 MED ORDER — PREGABALIN 20 MG/ML PO SOLN: 20.0000 mg | Freq: Three times a day (TID) | ORAL | 1 refills | Status: DC

## 2022-01-09 NOTE — Progress Notes (Signed)
Tracheostomy Procedure Note  David [name] [id number] 1975/12/23  Pre Procedure Tracheostomy Information  Trach Brand: Shiley Size:  4.0  [id number] Style: Uncuffed Secured by: Velcro   Procedure: Trach Observation and Assessmemt  No trach change today patient said he changed his own trach 1 day ago.   Post Procedure Tracheostomy Information  No Trach changed at this appointment  Post Procedure Evaluation:  ETCO2 positive color change from yellow to purple : Yes.   Did check ETCO2 on trach that was changed by patient a day prior to this visit. Good color change. Vital signs:VSS Patients current condition: stable Complications: No apparent complications Trach site exam: clean, dry Wound care done: dry Patient did tolerate procedure well.   Education: none  Prescription needs: none    Additional needs: Patient given a NEW PMV at this visit.  Pt also given a new [id number] to take home along with 1 box of [id number] innner cannulas

## 2022-01-09 NOTE — Progress Notes (Signed)
[place]  [phone]) [phone] [phone]) [phone]   Name: [name] Date: 01/09/2022 MRN: [id number]  DOB: Jan 11, 1976  Patient Care Team: Delene Ruffini, MD as PCP - General (Internal Medicine) [name], [name], RN as Oncology Nurse Navigator [name], [name], DO as Consulting Physician (Otolaryngology) [name], MD as Consulting Physician (Radiation Oncology) [name], MD as Consulting Physician (Hematology and Oncology) Pickenpack-Cousar, Carlena Sax, NP as Nurse Practitioner (Nurse Practitioner)    REASON FOR CONSULTATION: David [name] is a 46 y.o. 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 He was recently hospitalized for several months at [facility] where he received treatment for fungal endocarditis and required emergent tracheostomy and PEG tube. During hospitalization CT of chest showed concerns for residual vs recurrent tumor. Palliative requested to re-engage for ongoing support and goals of care.    SOCIAL HISTORY:     reports that he has been smoking cigarettes. He has been smoking an average of 3 packs per day. He has never used smokeless tobacco. He reports that he does not currently use alcohol after a past usage of about 12.0 standard drinks of alcohol per week. He reports that he does not currently use drugs after having used the following drugs: IV.  ADVANCE DIRECTIVES:  Patient reports he does not have advanced directives.  Would like to further discuss in the future.  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[place])    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 ([place])    Pneumococcal bacteremia 06/09/2021   Pneumothorax    Left lung spontaneous pneumothorax at age 63 yr     PAST SURGICAL HISTORY:  Past Surgical History:  Procedure Laterality Date   chest tubes Left    IR GASTROSTOMY TUBE MOD SED  06/18/2021   LAPAROSCOPIC INSERTION GASTROSTOMY TUBE N/A 06/20/2021   Procedure: LAPAROSCOPIC INSERTION GASTROSTOMY TUBE;  Surgeon: Clovis Riley, MD;  Location: [place];  Service: General;  Laterality: N/A;  DOW PATIENT   TRACHEOSTOMY TUBE PLACEMENT N/A 06/09/2021   Procedure: AWAKE TRACHEOSTOMY;  Surgeon: Melida Quitter, MD;  Location: [place];  Service: ENT;  Laterality: N/A;      ALLERGIES:  has No Known Allergies.  MEDICATIONS:  Current Outpatient Medications  Medication Sig Dispense Refill   fluconazole (DIFLUCAN) 200 MG tablet Take 4 tablets (800 mg total) by mouth daily. 120 tablet 3   guaiFENesin (MUCINEX) 600 MG 12 hr tablet Take 2 tablets (1,200 mg total) by mouth 2 (two) times daily. 60 tablet 1   hydrOXYzine (ATARAX) 25 MG tablet Place 4 tablets (100 mg total) into feeding tube 3 (three) times daily as needed for anxiety. 60 tablet 5   lidocaine (LIDODERM) 5 % Place 1 patch onto the skin daily. Remove & Discard patch within 12 hours or as directed by MD 30 patch 2   lidocaine (XYLOCAINE) 2 % solution Patient: Mix 1part 2% viscous lidocaine, 1part H20. Swish and spit 38m of diluted mixture up to eight times  a day, prn soreness 200 mL 5   melatonin 3 MG TABS tablet Take 1 tablet (3 mg total) by mouth at bedtime as needed. 90 tablet 2   methadone (DOLOPHINE) 10 MG/ML solution Take 6 mLs (60 mg total) by mouth daily. (Patient taking differently: Take 90 mg by mouth daily.) 90 mL 0   midodrine  (PROAMATINE) 5 MG tablet Take 1 tablet (5 mg total) by mouth 3 (three) times daily with meals. 100 tablet 6   Multiple Vitamin (MULTIVITAMIN WITH MINERALS) TABS tablet Place 1 tablet into feeding tube daily. 90 tablet 2   nicotine (NICODERM CQ - DOSED IN MG/24 HOURS) 14 mg/24hr patch Place 1 patch (14 mg total) onto the skin daily. Apply 21 mg patch daily x 6 wk, then '14mg'$  patch daily x 2 wk, then 7 mg patch daily x 2 wk 14 patch 0   ondansetron (ZOFRAN) 8 MG tablet Take 1 tablet (8 mg total) by mouth every 8 (eight) hours as needed for nausea or vomiting. Starting day 3 after chemotherapy 30 tablet 2   Oxycodone HCl 10 MG TABS Take 1 tablet (10 mg total) by mouth every 6 (six) hours. 30 tablet 0   pantoprazole (PROTONIX) 40 MG tablet Take 1 tablet (40 mg total) by mouth daily. 90 tablet 3   phenol (CHLORASEPTIC) 1.4 % LIQD Use as directed 1 spray in the mouth or throat as needed for throat irritation / pain. 177 mL 5   polyethylene glycol powder (GLYCOLAX/MIRALAX) 17 GM/SCOOP powder Place 17 g in water and drink daily. 238 g 3   Pregabalin 20 MG/ML SOLN Take 20 mg by mouth 3 (three) times daily. 60 mL 1   thiamine (VITAMIN B1) 100 MG tablet Place 1 tablet (100 mg total) into feeding tube daily. 90 tablet 2   ZINC OXIDE, TOPICAL, 10 % CREA Apply 1 Application topically 2 (two) times daily.  0   No current facility-administered medications for this visit.    VITAL SIGNS: BP 100/60 (BP Location: Left Arm, Patient Position: Sitting)   Pulse 78   Temp 98 F (36.7 C) (Oral)   Resp 19   Ht '5\' 11"'$  (1.803 m)   Wt 91 lb 4.8 oz (41.4 kg)   SpO2 100%   BMI 12.73 kg/m  Filed Weights   01/09/22 1025  Weight: 91 lb 4.8 oz (41.4 kg)      Estimated body mass index is 12.73 kg/m as calculated from the following:   Height as of this encounter: '5\' 11"'$  (1.803 m).   Weight as of this encounter: 91 lb 4.8 oz (41.4 kg).  PERFORMANCE STATUS (ECOG) : 1 - Symptomatic but completely ambulatory  Physical  Exam General: NAD, cachectic Cardiovascular: regular rate and rhythm Pulmonary: clear ant fields, normal breathing pattern, tracheostomy midline, Passey muir valve  Abdomen: soft, tender, + bowel sounds, PEG insertion site healing Extremities: no edema, no joint deformities Neurological: AAOx3, mood appropriate   IMPRESSION:  David [name] presents to the clinic today for follow-up. His mother is present with him. No acute distress. PEG tube site is healing. Some skin peeling around site. He is not able to eat as much due to poor dentition.  He reports his teeth are breaking off and are falling out.  Some lacerations to the tongue where sharp edges are cutting.  He has been closely followed by Dr. Benson Norway.  Mother reports he is scheduled to have all teeth extracted under general anesthesia in the OR on January 4.  [name] is tearful expressing he is losing weight and was doing so well prior to this.  He is unable to eat certain foods as he cannot chew.  Education provided on soft foods, protein drinks, and finally chopping his meat and or making pure consistency.  He verbalized understanding however is not happy about the situation.  He has been using Orajel to any open areas for pain.  We discussed medications. He is actively involved with [facility] for daily administration of his methadone. Increase lyrica to three times daily.   We will continue to closely monitor.   PLAN:  Emotional support. Lidocaine patch to back  Pregabalin 20 mg 3 times daily  Education provided on nutrition in the setting of poor dentition. He is scheduled for full mouth extraction on 1/4.  I will plan to see patient back in the clinic in 4-6 weeks in collaboration with his other oncology appointments.   Patient and mother expressed understanding and was in agreement with this plan. He also understands that He can call the clinic at any time with any questions, concerns, or complaints.         Any controlled  substances utilized were prescribed in the context of palliative care. PDMP has been reviewed.    Time Total: 45 min   Visit consisted of counseling and education dealing with the complex and emotionally intense issues of symptom management and palliative care in the setting of serious and potentially life-threatening illness.Greater than 50%  of this time was spent counseling and coordinating care related to the above assessment and plan.  Alda Lea, AGPCNP-BC  Palliative Medicine Team/[place]

## 2022-01-09 NOTE — Progress Notes (Signed)
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 He completed XRT 02/09/21. Recently been referred to [facility] from local ENT for persistent/residual disease in his neck and larynx. ENT from [facility]: evaluated 10/12. Found globally edematous Larynx, tissue was biopsed  I last saw [name] on 10/25 I noted that from trach stand-point he was stable but I had concern about subglottic stenosis given rush of air noted w/ finger occlusion. We decided to attempt capping trials on a PRN only basis alternating w/ PMV But when I saw him last I did not think he was at a point where decannulation could be considered  Interval review of records 10/26 heme/onc f/u: ET/CT which was suggestive of possible residual disease but exploration and biopsy showed no evidence of malignancy which is very reassuring. Plan was to see onc in a year 11/9 out-pt SLP recommended he continue asp precautions and exercises  11/14 seen by ENT  Procedure: Flexible fiberoptic laryngoscopy  "The right pyriform is effaced but no visible tumor seen. The epiglottis is edematous, as is the right aryepiglottic fold. I cannot see any visible ulcers or tumor of the endolarynx although visualization was difficult due to edema. Vocal fold mobility was not seen on the right. Airway very narrow due to the right arytenoid/AE fold edema"   Review of Systems  Constitutional:  Negative for chills and fever.  HENT:         Trach stoma a little sore. He replaced on his own   Eyes: Negative.   Respiratory:  Negative for cough, sputum production and shortness of breath.   Cardiovascular: Negative.   Gastrointestinal: Negative.   Genitourinary: Negative.   Skin: Negative.     Exam   General chronically ill appearing 46 year old male. Ambulatory. Chronically ill appearing HENT temporal wasting. Lurline Idol is size 4. It is midline. Stoma unremarkable. Some dry crusting around stoma site. + ETCO2  Card  RRR Pulm clear  Abd soft  Ext warm and dry  Neuro intact   Procedure: none  Active Problems:   Tracheostomy status (HCC)   Laryngeal cancer (HCC)  Tracheostomy status (HCC) Overview: Brand: shiley Size: 4 cuffless  Last change in clinic: 12/12   Discussion Stable trach. Pt changed himself on 12/12. I do not recommend this but if in emergency he has demonstrated he can do it. He used a ben-gay cream   Plan ROV 6 weeks Cont PMV and capping trials      Laryngeal cancer [facility]) Overview: Completed XRT Currently felt to have no evidence of active cancer per notes from ENT on 11/14 Plan F/u 3 mo w/ ENT F/u 1 yr heme/onc       My time 20 min   Erick Colace ACNP-BC [place] Pager # [phone] OR # [phone] if no answer

## 2022-01-11 ENCOUNTER — Encounter: Payer: Self-pay | Admitting: Radiation Oncology

## 2022-01-11 ENCOUNTER — Telehealth: Payer: Self-pay

## 2022-01-11 ENCOUNTER — Ambulatory Visit
Admission: RE | Admit: 2022-01-11 | Discharge: 2022-01-11 | Disposition: A | Discharge status: Home / Self Care | Payer: Medicaid Other | Source: Ambulatory Visit | Attending: Radiation Oncology | Admitting: Radiation Oncology

## 2022-01-11 VITALS — BP 131/92 | HR 74 | Temp 98.6°F | Resp 18 | Ht 71.0 in | Wt 93.1 lb

## 2022-01-11 DIAGNOSIS — C321 Malignant neoplasm of supraglottis: Secondary | ICD-10-CM | POA: Insufficient documentation

## 2022-01-11 DIAGNOSIS — Z79899 Other long term (current) drug therapy: Secondary | ICD-10-CM | POA: Insufficient documentation

## 2022-01-11 DIAGNOSIS — F17210 Nicotine dependence, cigarettes, uncomplicated: Secondary | ICD-10-CM | POA: Diagnosis not present

## 2022-01-11 DIAGNOSIS — K5900 Constipation, unspecified: Secondary | ICD-10-CM | POA: Insufficient documentation

## 2022-01-11 DIAGNOSIS — Z923 Personal history of irradiation: Secondary | ICD-10-CM | POA: Insufficient documentation

## 2022-01-11 DIAGNOSIS — C329 Malignant neoplasm of larynx, unspecified: Secondary | ICD-10-CM

## 2022-01-11 NOTE — Telephone Encounter (Signed)
Rn called to check availability of Viscous Lidocaine with pharmacy . They stated they had enough supply for this pt. Rn informed pt/ family and they stated they would pick it up.

## 2022-01-11 NOTE — Progress Notes (Signed)
Radiation Oncology         [phone]) [phone] ________________________________  Name: David [name] MRN: [id number]  Date: 01/11/2022  DOB: 1976-01-23  Follow-Up Visit Note  CC: Delene Ruffini, MD  [name], [name], DO  Diagnosis and Prior Radiotherapy:   C32.1     ICD-10-CM   1. Laryngeal cancer ([place])  C32.9     2. Malignant neoplasm of supraglottis (HCC)  C32.1      Cancer Staging  Laryngeal cancer [facility]) Staging form: Larynx - Supraglottis, AJCC 8th Edition - Clinical stage from 12/01/2020: Stage IVB (cT3, cN3b, cM0) - Signed by [name], MD on 12/20/2020 Stage prefix: Initial diagnosis   CHIEF COMPLAINT:  Here for follow-up and surveillance of laryngeal cancer  Narrative:   David [name] is here for a follow -up appointment today with Dr. Isidore Moos.Completed radiation to the larynx on 02-09-2021.   Pain issues, if any: tongue pain from broken teeth, denies throat pain but you feels like something is stuck Using a feeding tube?: pulled on in md office - no longer present Weight changes, if any: stable but low Wt Readings from Last 3 Encounters:  01/11/22 93 lb 2 oz (42.2 kg)  01/09/22 91 lb 4.8 oz (41.4 kg)  12/26/21 94 lb 6.4 oz (42.8 kg)    Swallowing issues, if any: modifying how he eats and drinks to get food in Denotation is a problem as well, losing teeth Smoking or chewing tobacco? Current smoker, Using fluoride trays daily? no Last ENT visit was on: bx on Oct 15 then saw for follow up on 12-11-21 with Dr. Nicolette Bang, Bx was negative  Other notable issues, if any: trach issues, Dr. Kary Kos  saw him this week for that, needs refills on Viscous Lidocaine, concern for constipation   Vitals:   01/11/22 1033  BP: (!) 131/92  Pulse: 74  Resp: 18  Temp: 98.6 F (37 C)  SpO2: 100%      ALLERGIES:  has No Known Allergies.  Meds: Current Outpatient Medications  Medication Sig Dispense Refill   fluconazole (DIFLUCAN) 200 MG tablet Take 4 tablets (800 mg  total) by mouth daily. 120 tablet 3   lidocaine (LIDODERM) 5 % Place 1 patch onto the skin daily. Remove & Discard patch within 12 hours or as directed by MD 30 patch 2   lidocaine (XYLOCAINE) 2 % solution Patient: Mix 1part 2% viscous lidocaine, 1part H20. Swish and spit 65m of diluted mixture up to eight times a day, prn soreness 200 mL 5   methadone (DOLOPHINE) 10 MG/ML solution Take 6 mLs (60 mg total) by mouth daily. (Patient taking differently: Take 90 mg by mouth daily.) 90 mL 0   midodrine (PROAMATINE) 5 MG tablet Take 1 tablet (5 mg total) by mouth 3 (three) times daily with meals. 100 tablet 6   Multiple Vitamin (MULTIVITAMIN WITH MINERALS) TABS tablet Place 1 tablet into feeding tube daily. 90 tablet 2   guaiFENesin (MUCINEX) 600 MG 12 hr tablet Take 2 tablets (1,200 mg total) by mouth 2 (two) times daily. (Patient not taking: Reported on 01/11/2022) 60 tablet 1   hydrOXYzine (ATARAX) 25 MG tablet Place 4 tablets (100 mg total) into feeding tube 3 (three) times daily as needed for anxiety. (Patient not taking: Reported on 01/11/2022) 60 tablet 5   melatonin 3 MG TABS tablet Take 1 tablet (3 mg total) by mouth at bedtime as needed. (Patient not taking: Reported on 01/11/2022) 90 tablet 2   nicotine (NICODERM CQ - DOSED  IN MG/24 HOURS) 14 mg/24hr patch Place 1 patch (14 mg total) onto the skin daily. Apply 21 mg patch daily x 6 wk, then '14mg'$  patch daily x 2 wk, then 7 mg patch daily x 2 wk (Patient not taking: Reported on 01/11/2022) 14 patch 0   ondansetron (ZOFRAN) 8 MG tablet Take 1 tablet (8 mg total) by mouth every 8 (eight) hours as needed for nausea or vomiting. Starting day 3 after chemotherapy (Patient not taking: Reported on 01/11/2022) 30 tablet 2   Oxycodone HCl 10 MG TABS Take 1 tablet (10 mg total) by mouth every 6 (six) hours. (Patient not taking: Reported on 01/11/2022) 30 tablet 0   pantoprazole (PROTONIX) 40 MG tablet Take 1 tablet (40 mg total) by mouth daily. (Patient not  taking: Reported on 01/11/2022) 90 tablet 3   phenol (CHLORASEPTIC) 1.4 % LIQD Use as directed 1 spray in the mouth or throat as needed for throat irritation / pain. (Patient not taking: Reported on 01/11/2022) 177 mL 5   polyethylene glycol powder (GLYCOLAX/MIRALAX) 17 GM/SCOOP powder Place 17 g in water and drink daily. (Patient not taking: Reported on 01/11/2022) 238 g 3   Pregabalin 20 MG/ML SOLN Take 20 mg by mouth 3 (three) times daily. (Patient not taking: Reported on 01/11/2022) 60 mL 1   thiamine (VITAMIN B1) 100 MG tablet Place 1 tablet (100 mg total) into feeding tube daily. (Patient not taking: Reported on 01/11/2022) 90 tablet 2   ZINC OXIDE, TOPICAL, 10 % CREA Apply 1 Application topically 2 (two) times daily. (Patient not taking: Reported on 01/11/2022)  0   No current facility-administered medications for this encounter.    Physical Findings: The patient is in no acute distress. Patient is alert and oriented. Wt Readings from Last 3 Encounters:  01/11/22 93 lb 2 oz (42.2 kg)  01/09/22 91 lb 4.8 oz (41.4 kg)  12/26/21 94 lb 6.4 oz (42.8 kg)    height is '5\' 11"'$  (1.803 m) and weight is 93 lb 2 oz (42.2 kg). His temporal temperature is 98.6 F (37 C). His blood pressure is 131/92 (abnormal) and his pulse is 74. His respiration is 18 and oxygen saturation is 100%. .  General: Alert and oriented, in mild distress HEENT: Head is normocephalic. Extraocular movements are intact. Oropharynx is notable for no tumor in upper throat.  His teeth are brittle.  He is still hoarse.  Trach present.  Neck: Neck is notable for mass in the right level 2 region  Abd: PEG tube is not present - PEG site scabbed over MSK: Muscle wasting, ambulatory   ECOG = 3  0 - Asymptomatic (Fully active, able to carry on all predisease activities without restriction)  1 - Symptomatic but completely ambulatory (Restricted in physically strenuous activity but ambulatory and able to carry out work of a light or  sedentary nature. For example, light housework, office work)  2 - Symptomatic, <50% in bed during the day (Ambulatory and capable of all self care but unable to carry out any work activities. Up and about more than 50% of waking hours)  3 - Symptomatic, >50% in bed, but not bedbound (Capable of only limited self-care, confined to bed or chair 50% or more of waking hours)  4 - Bedbound (Completely disabled. Cannot carry on any self-care. Totally confined to bed or chair)  5 - Death   Eustace Pen MM, Creech RH, Tormey DC, et al. [phone]). "Toxicity and response criteria of the [facility]".  [place] Oncol. 5 (6): 649-55  Lab Findings: Lab Results  Component Value Date   WBC 4.6 11/07/2021   HGB 11.6 (L) 11/07/2021   HCT 36.3 (L) 11/07/2021   MCV 91.0 11/07/2021   PLT 241 11/07/2021    Lab Results  Component Value Date   TSH 5.244 (H) 11/22/2021        Latest Ref Rng & Units 11/07/2021    3:59 PM 09/26/2021   12:13 PM 08/16/2021    5:40 AM  BMP  Glucose 65 - 99 mg/dL 104  107  125   BUN 7 - 25 mg/dL '12  13  19   '$ Creatinine 0.60 - 1.29 mg/dL 0.62  0.68  0.44   BUN/Creat Ratio 6 - 22 (calc) SEE NOTE:  SEE NOTE:    Sodium 135 - 146 mmol/L 139  135  133   Potassium 3.5 - 5.3 mmol/L 3.7  3.8  4.4   Chloride 98 - 110 mmol/L 105  100  100   CO2 20 - 32 mmol/L '28  25  24   '$ Calcium 8.6 - 10.3 mg/dL 8.9  9.3  9.3     Radiographic Findings: No results found.  Impression/Plan:    1) Head and Neck Cancer Status:  Following closely now with ENT at [facility]. Recent biopsy negative. PET performed which is indeterminate.  No plans for surgery currently.   2) Nutritional Status:  Wt Readings from Last 3 Encounters:  01/11/22 93 lb 2 oz (42.2 kg)  01/09/22 91 lb 4.8 oz (41.4 kg)  12/26/21 94 lb 6.4 oz (42.8 kg)   Patient is managing with oral diet - PEG pulled out.  3) Risk Factors: The patient has been educated about risk factors including alcohol and tobacco abuse;  they understand that avoidance of alcohol and tobacco is important to prevent recurrences as well as other cancers  He continues to smoke cigarettes and does not appear motivated to quit; he also has a history of illicit drug use  4) Swallowing:  Pt stopped SLP on his own accord.  Garald Balding did see him recently and states he will see patient again if patient is willing.  5) Dental: He has very brittle teeth.  Dr. Benson Norway states that he is eligible for extractions in the future - will be Jan if medically cleared.  6) Thyroid function:  normal Free T4 in October  7) constipation - Miralax recommended  8) He will follow-up with me in 5 months  - sooner if needed.  Emotional support given to the patient and his mom today.  On date of service, in total, I spent 30 minutes on this encounter. Patient was seen in person. _____________________________________   [name], MD

## 2022-01-14 ENCOUNTER — Other Ambulatory Visit: Payer: Self-pay

## 2022-01-14 DIAGNOSIS — Z1329 Encounter for screening for other suspected endocrine disorder: Secondary | ICD-10-CM

## 2022-01-25 ENCOUNTER — Other Ambulatory Visit: Payer: Medicaid Other

## 2022-01-25 DIAGNOSIS — Z515 Encounter for palliative care: Secondary | ICD-10-CM

## 2022-01-25 NOTE — Progress Notes (Signed)
COMMUNITY PALLIATIVE CARE SW NOTE  PATIENT NAME: David [name] DOB: 1975-05-11 MRN: [id number]  PRIMARY CARE PROVIDER: Delene Ruffini, MD  RESPONSIBLE PARTY:  Acct ID - Guarantor Home Phone Work Phone Relationship Acct Type  [id number] Alm [phone]  Self P/F     [address], [place], [place] [address]   Palliative Care Encounter/Clinical Social Work(12:00pm- 12:45 pm  Person (s) encountered: Patient's mother- David [name]  Purpose of the Visit: Follow-up/ status update  Assessment: LCSW completed a review and assessment of patient's family/social, mental health and medical history, allergies, medications, and health (functional) status, including patient/family self-reporting and a review of relevant consultative reports was completed today as part of a comprehensive evaluation by [facility] services.  Summary of Encounter/Visit: PC SW completed a telephonic encounter with patient's mother. She provided a status update on patient.  She advised that patient spent Christmas with her and laid on her sofa the entire time. Patient states he breaths better when he laying down. He is eating very little. He will drink Ensure that he received from the cancer center. Patient's current weight is 91 lbs and he continues to lose weight. His mother expressed concern over patient's hygiene and condition of his apartment, as patient is not allowing her to assist him. Patient needs assistance with personal care and housekeeping, but patient will not allow any assistance. Patient received PCS services, but terminated the services. His mother advises that patient has a great deal of support through other agencies and organizations. Despite receiving SSI, she is assisting patient with paying his rent and utilities. She also brings him food as he has loss his EBT card. SW inquired if she suspect patient maybe using drugs. She stated that  she suspects he may be using marijuana. Patient has intermittent low blood pressures. Patient has a hospital bed. His cancer is in remission and is scheduled to return to cancer center on 03/13/22. His mother states that she has observed another knot on the side of his neck.  His mother advises that she feels patient may need LTC, but patient wants to be as independent as possible. However, she is also willing to care for patient in her home in [place]. SW scheduled a follow-up visit with clinical team for further assessment and support.    Social Work Interventions Provided: chart review, assessment of patient, needs, comfort and status, assessed coping of PCG, reassurance of support, re-enforced PC contact information, scheduled follow-up visit by palliative care RN/clinical team.  Collaborative Services/Coordination of Care: Patient is scheduled to have dental surgery on 01/31/22 at [facility]  -attends Methadone clinic 3x week (MWF) -receives support through [facility] transportation through [place] for appointments.   Plan/Follow-up: RN/SW to see patient on 02/13/22 '@9'$ :30 am.   SOCIAL HX: Patient's drug use started at age 26. He has been to rehab several times.  Patient was homeless for two years prior to moving into his apartment. He has been married 2x and have no children. His mother is his PCG. His brother-David [name] serves as his POA. His father is in a memory care facility. Patient does not have any advance directives. Patient is a FULL CODE. *Patient's insurance source is Medicaid. Social History   Tobacco Use   Smoking status: Every Day    Packs/day: 3.00    Types: Cigarettes   Smokeless tobacco: Never   Tobacco comments:  10 cigs per day  Substance Use Topics   Alcohol use: Not Currently    Alcohol/week: 12.0 standard drinks of alcohol    Types: 12 Cans of beer per week    CODE STATUS: Full  Code ADVANCED DIRECTIVES: No MOST FORM COMPLETE:  No HOSPICE EDUCATION PROVIDED: Yes  Duration of encounter and documentation: 60 minutes  David Vandevoort, LCSW

## 2022-02-01 ENCOUNTER — Telehealth (HOSPITAL_COMMUNITY): Payer: Self-pay

## 2022-02-06 ENCOUNTER — Ambulatory Visit (INDEPENDENT_AMBULATORY_CARE_PROVIDER_SITE_OTHER): Payer: Medicaid Other | Admitting: Internal Medicine

## 2022-02-06 ENCOUNTER — Encounter: Payer: Self-pay | Admitting: Internal Medicine

## 2022-02-06 ENCOUNTER — Other Ambulatory Visit: Payer: Self-pay

## 2022-02-06 VITALS — BP 89/56 | HR 71 | Temp 98.3°F | Wt 99.0 lb

## 2022-02-06 DIAGNOSIS — I079 Rheumatic tricuspid valve disease, unspecified: Secondary | ICD-10-CM

## 2022-02-06 DIAGNOSIS — B376 Candidal endocarditis: Secondary | ICD-10-CM | POA: Diagnosis present

## 2022-02-06 NOTE — Patient Instructions (Addendum)
Continue fluconazole 400 mg once a day (you can take 200 mg pill and then 1-2 hours later another 200 mg pill if the size is an issue)   At about another year, potentially we can trial off. We don't clearly know if it will relapse off fluconazole though  Follow up 3 months   Labs today

## 2022-02-06 NOTE — Progress Notes (Signed)
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   Continuous tobacco abuse 11/21/2021   Dermatitis associated with moisture 10/16/2021   Back pain 08/16/2021   Candidal endocarditis    History of radiation to head and neck region 07/11/2021   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   Dysgeusia 07/11/2021   S/P percutaneous endoscopic gastrostomy (PEG) tube placement ([place]) 06/24/2021   Pressure injury of skin 06/17/2021   Tracheostomy status ([place])    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   Heart failure with reduced ejection fraction ([place]) 04/28/2021   Elevated troponin 04/01/2021   Elevated transaminase level 04/01/2021   Weight loss, unintentional 01/15/2021   Protein-calorie malnutrition, severe 01/10/2021   Hypotension 01/08/2021   AKI (acute kidney injury) ([place]) 01/08/2021   Chemotherapy induced nausea and vomiting 01/01/2021   Encounter for dental examination 12/13/2020   Laryngeal cancer ([place]) 12/04/2020   Teeth missing 12/04/2020   Radiation caries 12/04/2020   Retained tooth root 12/04/2020   Chronic apical periodontitis 12/04/2020   Accretions on teeth 12/04/2020      Subjective:    Patient ID: David [name], male    DOB: April 03, 1975, 47 y.o.   MRN: [id number]  Chief Complaint  Patient presents with   Follow-up    HPI:  David [name] is a 47 y.o. 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 Missed dose yesterday as out of med No side effect No fever, chill, rash  He is in in active f/u with [facility] ent for what could possibly be recurrent head/neck cancer. He has been in remission 3-4 months  He is being followed by palliative care team and is asking me if I can do with his chronic back pain/trach site pain  ----------------- 11/07/21 id clinic f/u Patient feeling well He has recent pet scan [facility] and this demonstrate some concerning lesion in his throat and this awaits biopsy. He takes 800 mg fluconazole daily and tolerates it well Again at this time no port/central catheter  No hair loss, n/v, rash, ruq pain, jaundice   02/06/22 Tolerating fluconazole, down to 400 mg once a day, compliant Missing 200 mg here and there sometimes due to size/nausea Otherwise no issue Wants handicap placcard application filled out    No Known Allergies    Outpatient Medications Prior to Visit  Medication Sig Dispense Refill   fluconazole (DIFLUCAN) 200 MG tablet Take 4 tablets (800 mg total) by mouth daily. 120 tablet 3   guaiFENesin (MUCINEX) 600 MG 12 hr tablet Take 2 tablets (1,200 mg total) by mouth 2 (two) times daily. (Patient not taking: Reported on 01/11/2022) 60 tablet 1   hydrOXYzine (ATARAX) 25 MG tablet Place 4 tablets (100 mg total) into feeding tube 3 (three) times daily as needed for anxiety. (Patient not taking: Reported on 01/11/2022) 60  tablet 5   lidocaine (LIDODERM) 5 % Place 1 patch onto the skin daily. Remove & Discard patch within 12 hours or as directed by MD 30 patch 2   lidocaine (XYLOCAINE) 2 % solution Patient: Mix 1part 2% viscous lidocaine, 1part H20. Swish and spit 41m of diluted mixture up to eight times a day, prn soreness 200 mL 5   melatonin 3 MG TABS tablet Take  1 tablet (3 mg total) by mouth at bedtime as needed. (Patient not taking: Reported on 01/11/2022) 90 tablet 2   methadone (DOLOPHINE) 10 MG/ML solution Take 6 mLs (60 mg total) by mouth daily. (Patient taking differently: Take 90 mg by mouth daily.) 90 mL 0   midodrine (PROAMATINE) 5 MG tablet Take 1 tablet (5 mg total) by mouth 3 (three) times daily with meals. 100 tablet 6   Multiple Vitamin (MULTIVITAMIN WITH MINERALS) TABS tablet Place 1 tablet into feeding tube daily. 90 tablet 2   nicotine (NICODERM CQ - DOSED IN MG/24 HOURS) 14 mg/24hr patch Place 1 patch (14 mg total) onto the skin daily. Apply 21 mg patch daily x 6 wk, then '14mg'$  patch daily x 2 wk, then 7 mg patch daily x 2 wk (Patient not taking: Reported on 01/11/2022) 14 patch 0   ondansetron (ZOFRAN) 8 MG tablet Take 1 tablet (8 mg total) by mouth every 8 (eight) hours as needed for nausea or vomiting. Starting day 3 after chemotherapy (Patient not taking: Reported on 01/11/2022) 30 tablet 2   Oxycodone HCl 10 MG TABS Take 1 tablet (10 mg total) by mouth every 6 (six) hours. (Patient not taking: Reported on 01/11/2022) 30 tablet 0   pantoprazole (PROTONIX) 40 MG tablet Take 1 tablet (40 mg total) by mouth daily. (Patient not taking: Reported on 01/11/2022) 90 tablet 3   phenol (CHLORASEPTIC) 1.4 % LIQD Use as directed 1 spray in the mouth or throat as needed for throat irritation / pain. (Patient not taking: Reported on 01/11/2022) 177 mL 5   polyethylene glycol powder (GLYCOLAX/MIRALAX) 17 GM/SCOOP powder Place 17 g in water and drink daily. (Patient not taking: Reported on 01/11/2022) 238 g 3   Pregabalin 20 MG/ML SOLN Take 20 mg by mouth 3 (three) times daily. (Patient not taking: Reported on 01/11/2022) 60 mL 1   thiamine (VITAMIN B1) 100 MG tablet Place 1 tablet (100 mg total) into feeding tube daily. (Patient not taking: Reported on 01/11/2022) 90 tablet 2   ZINC OXIDE, TOPICAL, 10 % CREA Apply 1 Application topically 2 (two) times  daily. (Patient not taking: Reported on 01/11/2022)  0   No facility-administered medications prior to visit.     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)   Sexual activity: Not on file  Other Topics Concern   Not on file  Social History Narrative   Not on file   Social Determinants of Health   Financial Resource Strain: Not on file  Food Insecurity: Not on file  Transportation Needs: Not on file  Physical Activity: Not on file  Stress: Not on file  Social Connections: Not on file  Intimate Partner Violence: Not on file      Review of Systems    All other ros negative  Objective:    BP (!) 89/56 Comment: patient is asymptomatic  Pulse 71   Temp 98.3 F (36.8 C) (Oral)   Wt 99 lb (44.9 kg)   SpO2 99%   BMI 13.81 kg/m  Nursing note and vital signs reviewed.  Physical Exam     General/constitutional: no distress, pleasant HEENT: Normocephalic, PER, Conj Clear, EOMI, Oropharynx clear Neck supple -- trach site clean/dry CV: rrr no mrg Lungs: clear to auscultation, normal respiratory effort Abd: Soft, Nontender Ext: no edema Skin: No Rash Neuro: nonfocal   Labs: Lab Results  Component Value Date   WBC 4.6 11/07/2021   HGB 11.6 (L) 11/07/2021   HCT 36.3 (L) 11/07/2021   MCV 91.0 11/07/2021   PLT 241 17/40/8144   Last  metabolic panel Lab Results  Component Value Date   GLUCOSE 104 (H) 11/07/2021   NA 139 11/07/2021   K 3.7 11/07/2021   CL 105 11/07/2021   CO2 28 11/07/2021   BUN 12 11/07/2021   CREATININE 0.62 11/07/2021   GFRNONAA >60 08/16/2021   CALCIUM 8.9 11/07/2021   PHOS 4.2 06/25/2021   PROT 7.4 11/07/2021   ALBUMIN 2.6 (L) 08/13/2021   BILITOT 0.2 11/07/2021   ALKPHOS 93 08/13/2021   AST 29 11/07/2021   ALT 18 11/07/2021   ANIONGAP 9 08/16/2021    Micro:  Serology:  Imaging:  Assessment & Plan:   Problem List Items Addressed This Visit       Cardiovascular and Mediastinum   Candidal endocarditis - Primary   Other Visit Diagnoses     Endocarditis of tricuspid valve           No orders of the defined types were placed in this encounter.    Fungal tv endocarditis Candida albican Will continue his fluconazole high dose 800 mg for now. Potentially transition to lower dose at another 3-6 months No side effect at this time clinically  Labs today Liquid form fluconazole ordered for a year F/u 4-6 weeks    Ent cancer F/u [facility]  ----- 11/07/21 id assessment Doing well Pending repeat upper oral airway biopsy for pet avid lesion  Would continue high dose 12 mg/kg fluconazole for another 3 months then go to 6 mg/kg Advise patient of periodic once or twice a year ekg and discuss potential drug interaction that could make this worse   02/06/22 assessment Patient had transitioned to 400 mg daily about December 2023. No side effect He asked about if or when he could go off of fluconazole Given his age it is not unreasonable to try, but I would not do for candida endocarditis, at least another year from now (01/2023) He asked for handicap placcard application to be filled out today and we can. Renewal probably needs pcp fill out. Filled out for 6 months  Follow up in 3 months   Follow-up: Return in about 3 months (around 05/08/2022).  Jabier Mutton,  [facility] for Infectious [facility] 02/06/2022, 2:37 PM

## 2022-02-07 LAB — COMPLETE METABOLIC PANEL WITH GFR
AG Ratio: 1.1 (calc) (ref 1.0–2.5)
ALT: 51 U/L — ABNORMAL HIGH (ref 9–46)
AST: 51 U/L — ABNORMAL HIGH (ref 10–40)
Albumin: 4 g/dL (ref 3.6–5.1)
Alkaline phosphatase (APISO): 64 U/L (ref 36–130)
BUN: 17 mg/dL (ref 7–25)
CO2: 24 mmol/L (ref 20–32)
Calcium: 9.1 mg/dL (ref 8.6–10.3)
Chloride: 103 mmol/L (ref 98–110)
Creat: 0.71 mg/dL (ref 0.60–1.29)
Globulin: 3.7 g/dL (calc) (ref 1.9–3.7)
Glucose, Bld: 111 mg/dL — ABNORMAL HIGH (ref 65–99)
Potassium: 4.4 mmol/L (ref 3.5–5.3)
Sodium: 139 mmol/L (ref 135–146)
Total Bilirubin: 0.3 mg/dL (ref 0.2–1.2)
Total Protein: 7.7 g/dL (ref 6.1–8.1)
eGFR: 114 mL/min/{1.73_m2} (ref >=60)

## 2022-02-07 LAB — CBC
HCT: 34.6 % — ABNORMAL LOW (ref 38.5–50.0)
Hemoglobin: 11.5 g/dL — ABNORMAL LOW (ref 13.2–17.1)
MCH: 30.4 pg (ref 27.0–33.0)
MCHC: 33.2 g/dL (ref 32.0–36.0)
MCV: 91.5 fL (ref 80.0–100.0)
MPV: 11.3 fL (ref 7.5–12.5)
Platelets: 169 10*3/uL (ref 140–400)
RBC: 3.78 10*6/uL — ABNORMAL LOW (ref 4.20–5.80)
RDW: 14.5 % (ref 11.0–15.0)
WBC: 4.7 10*3/uL (ref 3.8–10.8)

## 2022-02-11 ENCOUNTER — Telehealth (HOSPITAL_COMMUNITY): Payer: Self-pay

## 2022-02-13 ENCOUNTER — Ambulatory Visit (HOSPITAL_COMMUNITY)
Admission: RE | Admit: 2022-02-13 | Discharge: 2022-02-13 | Disposition: A | Discharge status: Home / Self Care | Payer: Medicaid Other | Source: Ambulatory Visit | Attending: Acute Care | Admitting: Acute Care

## 2022-02-13 ENCOUNTER — Other Ambulatory Visit: Payer: Medicaid Other

## 2022-02-13 VITALS — BP 80/62 | HR 82 | Resp 20

## 2022-02-13 DIAGNOSIS — Z930 Tracheostomy status: Secondary | ICD-10-CM | POA: Diagnosis present

## 2022-02-13 DIAGNOSIS — Z515 Encounter for palliative care: Secondary | ICD-10-CM

## 2022-02-13 DIAGNOSIS — C329 Malignant neoplasm of larynx, unspecified: Secondary | ICD-10-CM | POA: Diagnosis present

## 2022-02-13 NOTE — Progress Notes (Signed)
[facility] Follow Up Encounter Note       PATIENT NAME: David [name] DOB: 04-27-1975 MRN: [id number]  PRIMARY CARE PROVIDER: Delene Ruffini, MD  RESPONSIBLE PARTY:  Acct ID - Guarantor Home Phone Work Phone Relationship Acct Type  [id number] Alm [id number]  Self P/F     [place] [address], [place], [place] [address]    RN/ M. Lonnon,SW completed follow up visit in home. Mom also present    HISTORY OF PRESENT ILLNESS:    47 y.o. year old male with multiple morbidities requiring close monitoring and with high risk of complications and mortality: laryngeal CA, Dysphagia, tracheostomy status, low back pain, protein caloric malnutrition.    Socially: Lives alone in apartment.    Cognitive: Alert and oriented to person, place, and time. Pt talking to mom in very short, dismissive sentences. Appears frustrated. States, "how long is this going to take? I want to take a nap before my appt at the trach clinic". When mom asks a question or tries to provide input, pt talks over her and interrupts her.   Appetite: Reports having difficulty in the past eating d/t loose teeth. Goes to medical dental clinic at [facility]. States appetite is good. Eats 100% of 3 meals per day. Drinks ensure every day.    Mobility: steady. Denies falls. No use of assistive devices. Does not drive but gets a ride to appts with Medicaid transport or mom   Sleeping Pattern: Denies any issues with sleeping. States, I have always had a weird sleep schedule.  Pain: Denies.  Palliative Care/ Hospice: RN explained role and purpose of palliative care including visit frequency. Also discussed benefits of hospice care as well as the differences between the two with patient.  Pt appears to completely close off when hospice is mentioned. Gets up and walks to couch to lay down to take nap prior to trach clinic. Palliative Care packet with contact info given. Reports that he is still a smoker.  States that he smokes 3 cigs per day. 99%/82  80/68  Goals of Care: wants to live independently as long as possible. Coughing up mucus plugs tinged with blood.     CODE STATUS: Full Code ADVANCED DIRECTIVES: N MOST FORM: No PPS:    PHYSICAL EXAM:   VITALS: Today's Vitals   02/13/22 0950  BP: (!) 80/62  Pulse: 82  Resp: 20  SpO2: 96%  PainSc: 0-No pain    LUNGS: Rhonchi auscultated. Trach in place. Stoma is clean and dry with no erythema. Pt coughing up thick brown mucus plugs from trach. Has appt at trach clinic today for trach change. CARDIAC: Regular  EXTREMITIES: WNL SKIN: WNL    Jacqulyn Cane, RN

## 2022-02-13 NOTE — Progress Notes (Addendum)
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 He completed XRT 02/09/21. Recently been referred to [facility] from local ENT for persistent/residual disease in his neck and larynx. ENT from [facility]: evaluated 10/12. Found globally edematous Larynx, tissue was biopsed  I last saw [name] on 12/13  Comes today for planned trach change. 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 Comes in today w/ concern for coughing up more mucous    Interval review of records 10/26 heme/onc f/u: ET/CT which was suggestive of possible residual disease but exploration and biopsy showed no evidence of malignancy which is very reassuring. Plan was to see onc in a year 11/9 out-pt SLP recommended he continue asp precautions and exercises  11/14 seen by ENT  Procedure: Flexible fiberoptic laryngoscopy  "The right pyriform is effaced but no visible tumor seen. The epiglottis is edematous, as is the right aryepiglottic fold. I cannot see any visible ulcers or tumor of the endolarynx although visualization was difficult due to edema. Vocal fold mobility was not seen on the right. Airway very narrow due to the right arytenoid/AE fold edema"   Review of Systems  Constitutional: Negative.   HENT: Negative.    Eyes: Negative.   Respiratory:  Positive for cough and sputum production. Negative for hemoptysis and shortness of breath.        Coughing up think brown colored plugs   Cardiovascular: Negative.   Gastrointestinal: Negative.   Genitourinary: Negative.   Skin: Negative.     Exam  Frail 47 year old male currently in no distress HENT #4 cuffless trach. It is unremarkable at stoma site. Does still have thick dry mucous plugs he is coughing up now  Pulm scattered rhonchi, no accessory use  Card rrr Abd soft   Ext warm and dry brisk cr   Procedure: The # 4 trach was removed. Site inspected.. was unremarkable. A new trach was placed over obturator w/out difficulty thru the existing stoma. Pt tolerated well placement verified via etco2   Active Problems:   Tracheostomy status ([place])   Laryngeal cancer ([place])  Tracheostomy status ([place]) Overview: Brand: shiley Size: 4 cuffless  Last change in clinic: 1/17  Discussion Stable trach. Is having trouble with mucous plugging. I do not think he has acute infection but does have very thick dry mucous that is causing occasional trach obstruction. He just started using his humidification recently.  Still not a candidate for decannulation due to weak cough   Plan ROV 4 weeks Cont PMV as tolerated Humidification of trach at HS Encouraged him to increase water intake  Also encouraged he and his mother pick up some mucinex.    Laryngeal cancer [facility]) Overview: Completed XRT Currently felt to have no evidence of active cancer per notes from ENT on 11/14.  Plan F/u 3 mo w/ ENT F/u 1 yr heme/onc       My time 20 min   Erick Colace ACNP-BC [place] Pager # [phone] OR # [phone] if no answer
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
## 2022-02-13 NOTE — Progress Notes (Signed)
Tracheostomy Procedure Note  David [name] [id number] April 13, 1975  Pre Procedure Tracheostomy Information  Trach Brand: Shiley Size:  4.0  [id number] Style: Uncuffed Secured by: Velcro   Procedure: Trach change and trach cleaning    Post Procedure Tracheostomy Information  Trach Brand: Shiley Size:  4.0 [id number] Style: Uncuffed Secured by: Velcro   Post Procedure Evaluation:  ETCO2 positive color change from yellow to purple : Yes.   Vital signs:VSS Patients current condition: stable Complications: No apparent complications Trach site exam: clean, dry Wound care done: 4 x 4 gauze drain  Patient did tolerate procedure well.   Education: none  Prescription needs: none    Additional needs: none

## 2022-02-16 ENCOUNTER — Other Ambulatory Visit: Payer: Self-pay | Admitting: Student

## 2022-02-16 MED ORDER — OSELTAMIVIR PHOSPHATE 75 MG PO CAPS: 75.0000 mg | ORAL_CAPSULE | Freq: Two times a day (BID) | ORAL | 0 refills | Status: AC

## 2022-02-16 NOTE — Progress Notes (Addendum)
David [name] mother paged the after hours number as she recently tested positive for influenza and was around her son. He is asymptomatic but with his chronic medical conditions she was wondering about post exposure prophylaxis. With his continued treatment for candidal endocarditis, hx of malignancy will prescribe 5 day course of tamiflu. He has not received influenza vaccine this year. Discussed if does develop symptoms to stay home and call us for an appointment if symptoms arise.

## 2022-02-21 ENCOUNTER — Encounter: Payer: Medicaid Other | Admitting: Nurse Practitioner

## 2022-02-25 NOTE — Progress Notes (Signed)
Oncology Nurse Navigator Documentation   I received notice that Mr. Nouri mom, [name] called reporting a new nodule on the right side of his neck. They are both concerned that this may be a recurrence of his cancer. Dr. Isidore Moos was notified and recommended that David [name] be seen by his ENT MD in case a biopsy was necessary. He is scheduled to see Dr. Nicolette Bang at [facility] on 2/13. I spoke with [name] and encouraged them to keep that appointment for evaluation. She knows to call if she has any further questions or concerns.   Harlow Asa RN, BSN, OCN Head & Neck Oncology Nurse [place] at [facility] Phone # [phone]  Fax # [phone]

## 2022-02-27 ENCOUNTER — Telehealth: Payer: Self-pay | Admitting: *Deleted

## 2022-02-27 NOTE — Telephone Encounter (Signed)
Patient's mother called office. She reports he has another "knot" on side of his neck, warm to touch and growing bigger every day. His appt at [facility] is on 03/12/22. She called and they can't see him earlier. Ms. Cazier wants to know if Dr. Chryl Heck will see him to evaluate it - she'd like to bring him tomorrow if there is an appt available. Dr. Chryl Heck given message/information. Per Dr. Chryl Heck, offer mother/patient appt Friday, 03/01/22 @ 2 PM. Patient's mother in agreement. Appt scheduled.

## 2022-03-01 ENCOUNTER — Inpatient Hospital Stay: Payer: Medicaid Other | Attending: Radiation Oncology | Admitting: Hematology and Oncology

## 2022-03-01 ENCOUNTER — Other Ambulatory Visit: Payer: Self-pay

## 2022-03-01 VITALS — BP 117/82 | HR 93 | Temp 97.9°F | Resp 16 | Ht 71.0 in | Wt 100.3 lb

## 2022-03-01 DIAGNOSIS — Z923 Personal history of irradiation: Secondary | ICD-10-CM | POA: Insufficient documentation

## 2022-03-01 DIAGNOSIS — C328 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: Secondary | ICD-10-CM | POA: Insufficient documentation

## 2022-03-01 DIAGNOSIS — Z930 Tracheostomy status: Secondary | ICD-10-CM | POA: Diagnosis not present

## 2022-03-01 DIAGNOSIS — C329 Malignant neoplasm of larynx, unspecified: Secondary | ICD-10-CM | POA: Diagnosis not present

## 2022-03-01 DIAGNOSIS — Z9221 Personal history of antineoplastic chemotherapy: Secondary | ICD-10-CM | POA: Diagnosis not present

## 2022-03-01 DIAGNOSIS — F419 Anxiety disorder, unspecified: Secondary | ICD-10-CM | POA: Diagnosis not present

## 2022-03-01 DIAGNOSIS — R221 Localized swelling, mass and lump, neck: Secondary | ICD-10-CM

## 2022-03-01 NOTE — Progress Notes (Signed)
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    His last PET/CT showed disease which was concerning however biopsy was negative at that time.  He was last seen by ENT back in November 2023 and impression was that he had residual lymphadenopathy however the question was if this was treated nodes versus versus residual viable tumor.  Core biopsy in August 2023 was without any viable tumor cells.  I have not seen him in many months.  He has since followed by Dr. Isidore Moos.  He has a follow-up with ENT at [facility] in February 2024 however his mom has called several times complaining of a growth in his neck which has been progressing by the day and she was very frantic about it.  Hence we have accommodated to see him today.  I have in the past and today once again mentioned to him that even if he has residual disease or progressive metastatic disease, he is unfortunately not a candidate for any form of chemotherapy.  He has illicit drug use, had fungal endocarditis with prolonged treatment.    On exam today, he mentions this asymmetric swelling of the neck which has been going on for months, intermittently swells up and he was previously told that this could be lymphedema.  He does not think this has significantly changed.  His mom however apparently was very worried about this, felt that it was warm when she touched it.   He does have a follow-up with ENT on February 13. He may benefit from some imaging of the neck to clarify this.  I will however let Dr. Nicolette Bang see him and give additional recommendations.  If he does have recurrence or systemic disease, I am happy to see him back to discuss any additional options for treatment.  I would also recommend he see medical oncology at [facility] for additional recommendations as well.  I did not feel that there is any acute infection that warrants immediate intervention.  SUMMARY OF ONCOLOGIC HISTORY: Oncology History  Laryngeal cancer ([place])  12/01/2020 Cancer Staging   Staging form: Larynx - Supraglottis, AJCC 8th Edition - Clinical stage from 12/01/2020: Stage IVB (cT3, cN3b, cM0) - Signed by [name], MD on 12/20/2020 Stage prefix: Initial diagnosis   12/04/2020 Initial Diagnosis   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 [facility])   12/25/2020 - 01/24/2021 Chemotherapy   Patient is on Treatment Plan : HEAD/NECK Cisplatin + XRT q21d      CURRENT THERAPY: s/p chemo and radiation  INTERVAL HISTORY:  Grayden Burley 47 y.o. male returns for evaluation of his head neck cancer.   Since his last visit, we had multiple phone calls from his mom about growth in his neck which has been progressing by the day.  She tried to follow-up with [facility] but did not have any success obtaining an appointment sooner than February 14.  She was  frantic about this and called [name] multiple times hence we accommodated to be seen today.   He came in with his mom today.  He tells me that the swelling has been going on for months and it intermittently swells up more and he was previously told this could be some lymphatic swelling.  Mom however feels like this was very warm and it came with no warning and she was very frightened that this could be cancer.  He also points to some tightening and swelling in his neck where his previous cancer was.  He actually looks better,  has been eating well, gained some weight.  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  Denies any shortness of breath or change in bowel habits.  He seems to have not been concerned by her worry at all. 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   Continuous tobacco abuse 11/21/2021   Dermatitis associated with moisture 10/16/2021   Back pain 08/16/2021   Candidal endocarditis    History of radiation to head and neck region 07/11/2021   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   Dysgeusia 07/11/2021   S/P percutaneous endoscopic gastrostomy (PEG) tube placement ([place]) 06/24/2021   Pressure injury of skin 06/17/2021   Tracheostomy status ([place])    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   Heart failure with reduced ejection fraction ([place]) 04/28/2021   Elevated troponin 04/01/2021   Elevated transaminase level 04/01/2021   Weight loss, unintentional 01/15/2021   Protein-calorie malnutrition, severe 01/10/2021   Hypotension 01/08/2021   AKI (acute kidney injury) ([place]) 01/08/2021   Chemotherapy induced nausea and vomiting 01/01/2021   Encounter for dental examination 12/13/2020   Laryngeal cancer ([place]) 12/04/2020   Teeth missing 12/04/2020   Radiation caries 12/04/2020   Retained tooth root 12/04/2020   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[place])    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 ([place])    Pneumococcal bacteremia 06/09/2021   Pneumothorax    Left lung spontaneous pneumothorax at age 60 yr     SURGICAL HISTORY: Past Surgical History:  Procedure Laterality Date   chest tubes Left    IR GASTROSTOMY TUBE MOD SED  06/18/2021   LAPAROSCOPIC INSERTION GASTROSTOMY TUBE N/A 06/20/2021   Procedure: LAPAROSCOPIC INSERTION GASTROSTOMY TUBE;  Surgeon: Clovis Riley, MD;  Location: [place];  Service: General;  Laterality: N/A;  DOW PATIENT   TRACHEOSTOMY TUBE PLACEMENT N/A 06/09/2021   Procedure: AWAKE TRACHEOSTOMY;  Surgeon: Melida Quitter, MD;  Location: [facility];  Service: ENT;  Laterality: N/A;    SOCIAL HISTORY: Social History   Socioeconomic History   Marital status: Divorced  Spouse name: Not on file   Number of children: Not on file   Years of education: Not on file   Highest education level: Not on file  Occupational History   Not on file  Tobacco Use   Smoking status: Every Day    Packs/day: 3.00    Types: Cigarettes   Smokeless tobacco: Never   Tobacco comments:    10 cigs per day  Vaping Use   Vaping Use: Never used  Substance and Sexual Activity   Alcohol use: Not Currently    Alcohol/week: 12.0 standard drinks of alcohol    Types: 12 Cans of beer per week   Drug use: Not Currently    Types: IV    Comment: 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)   Sexual activity: Not on file  Other Topics Concern   Not on file  Social History Narrative   Not on file   Social Determinants of Health   Financial Resource Strain: Not on file  Food Insecurity: Not on file  Transportation Needs: Not on file  Physical  Activity: Not on file  Stress: Not on file  Social Connections: Not on file  Intimate Partner Violence: Not on file    FAMILY HISTORY: No family history on file.   PHYSICAL EXAMINATION  ECOG PERFORMANCE STATUS: 1 - Symptomatic but completely ambulatory  Vitals:   03/01/22 1349  BP: 117/82  Pulse: 93  Resp: 16  Temp: 97.9 F (36.6 C)  SpO2: 100%   Physical Exam Constitutional:      Appearance: Normal appearance.  Neck:     Comments: Right neck with tightening and swelling.  There is seems to be asymmetric swelling of the right neck extending up to the mandible.  He tells me that the swelling has been happening for several months and has been going up and down and he was told this was lymphedema.  No definitive lumps.  Again his neck is very hard to examine given the spasticity. Cardiovascular:     Rate and Rhythm: Normal rate and regular rhythm.     Pulses: Normal pulses.     Heart sounds: Normal heart sounds.  Pulmonary:     Effort: Pulmonary effort is normal.     Breath sounds: Normal breath sounds.  Musculoskeletal:        General: No swelling.     Cervical back: Rigidity present.     Comments: Range of motion limited in the neck.  Neurological:     Mental Status: He is alert.      LABORATORY DATA:  CBC    Component Value Date/Time   WBC 4.7 02/06/2022 1453   RBC 3.78 (L) 02/06/2022 1453   HGB 11.5 (L) 02/06/2022 1453   HGB 14.0 03/30/2021 0856   HCT 34.6 (L) 02/06/2022 1453   PLT 169 02/06/2022 1453   PLT 308 03/30/2021 0856   MCV 91.5 02/06/2022 1453   MCV 100.9 (A) 07/27/2011 1302   MCH 30.4 02/06/2022 1453   MCHC 33.2 02/06/2022 1453   RDW 14.5 02/06/2022 1453   LYMPHSABS 616 (L) 09/26/2021 1213   MONOABS 0.2 08/10/2021 1025   EOSABS 122 09/26/2021 1213   BASOSABS 31 09/26/2021 1213    CMP     Component Value Date/Time   NA 139 02/06/2022 1453   K 4.4 02/06/2022 1453   CL 103 02/06/2022 1453   CO2 24 02/06/2022 1453   GLUCOSE 111 (H)  02/06/2022 1453   BUN 17 02/06/2022  1453   CREATININE 0.71 02/06/2022 1453   CALCIUM 9.1 02/06/2022 1453   PROT 7.7 02/06/2022 1453   ALBUMIN 2.6 (L) 08/13/2021 0301   AST 51 (H) 02/06/2022 1453   AST 88 (H) 03/30/2021 0856   ALT 51 (H) 02/06/2022 1453   ALT 39 03/30/2021 0856   ALKPHOS 93 08/13/2021 0301   BILITOT 0.3 02/06/2022 1453   BILITOT 0.6 03/30/2021 0856   GFRNONAA >60 08/16/2021 0540   GFRNONAA >60 05/15/2021 1339   GFRAA  01/25/2008 2040    >60        The eGFR has been calculated using the MDRD equation. This calculation has not been validated in all clinical    PENDING LABS: CBC reviewed, mild leukocytosis, otherwise no evidence of anemia or thrombocytopenia. CMP reviewed.   RADIOGRAPHIC STUDIES:  No results found.  PET CT 9/28  CONCLUSION:   1.  Status post chemoradiation of a right hypopharyngeal/supraglottic squamous cell carcinoma and subsequent tracheostomy with foci of increased FDG uptake visualized along the posterior aspect of the left cricoid cartilage and along the anterior aspect of the right cricoarytenoid cartilage, which appear to correlate to foci of contrast enhancement seen on prior CT neck dated 08/11/2021 and are concerning for recurrent disease. Correlation with direct inspection and tissue sampling is recommended.  2.  Mild diffuse uptake along the tracheostomy site and central neck compartment is favored to represent posttreatment changes.  3.  Focus of mild FDG avidity likely within the upper esophagus could be further evaluated by endoscopy versus attention on follow-up imaging as clinically indicated. No definite associated soft tissue mass on CT is noted in this region.  4.  Non-FDG avid, peripherally calcified cervical lymph nodes are stable in comparison to prior CT neck. No new, enlarging or FDG avid lymph nodes are identified.  5.  Cardiomegaly, with significantly increased size of the right ventricle in comparison to prior. Recommend  cardiologic consultation and echocardiogram if not already performed.    6.  Diffuse groundglass opacities and tree-in-bud opacifications with multiple subcentimeter nodules involving the right lung are new in comparison to prior PET CT, however improved relative to most recent CT chest and likely represent evolution of prior pneumonia.  PATHOLOGY: 11/08/2021  Final Pathologic Diagnosis    A. RIGHT PYRIFORM, BIOPSY:               Squamous mucosa with fibrosis.              Negative for high-grade dysplasia and carcinoma     All questions were answered. The patient knows to call the clinic with any problems, questions or concerns. We can certainly see the patient much sooner if necessary.  Total encounter time: 30 minutes in face-to-face visit time, chart review, lab review, care coordination, and documentation of the encounter*  *Total Encounter Time as defined by the Centers for Medicare and Medicaid Services includes, in addition to the face-to-face time of a patient visit (documented in the note above) non-face-to-face time: obtaining and reviewing outside history, ordering and reviewing medications, tests or procedures, care coordination (communications with other health care professionals or caregivers) and documentation in the medical record.

## 2022-03-04 ENCOUNTER — Inpatient Hospital Stay (HOSPITAL_BASED_OUTPATIENT_CLINIC_OR_DEPARTMENT_OTHER): Payer: Medicaid Other | Admitting: Nurse Practitioner

## 2022-03-04 ENCOUNTER — Encounter: Payer: Self-pay | Admitting: Nurse Practitioner

## 2022-03-04 ENCOUNTER — Encounter: Payer: Self-pay | Admitting: Nutrition

## 2022-03-04 DIAGNOSIS — K9423 Gastrostomy malfunction: Secondary | ICD-10-CM

## 2022-03-04 DIAGNOSIS — Z515 Encounter for palliative care: Secondary | ICD-10-CM | POA: Diagnosis not present

## 2022-03-04 DIAGNOSIS — B49 Unspecified mycosis: Secondary | ICD-10-CM

## 2022-03-04 DIAGNOSIS — C329 Malignant neoplasm of larynx, unspecified: Secondary | ICD-10-CM | POA: Diagnosis not present

## 2022-03-04 DIAGNOSIS — G893 Neoplasm related pain (acute) (chronic): Secondary | ICD-10-CM | POA: Diagnosis not present

## 2022-03-04 DIAGNOSIS — R112 Nausea with vomiting, unspecified: Secondary | ICD-10-CM

## 2022-03-04 MED ORDER — PREGABALIN 20 MG/ML PO SOLN: 20.0000 mg | Freq: Three times a day (TID) | ORAL | 1 refills | Status: DC

## 2022-03-04 MED ORDER — FLUCONAZOLE 200 MG PO TABS: 800.0000 mg | ORAL_TABLET | Freq: Every day | ORAL | 3 refills | Status: DC

## 2022-03-04 MED ORDER — ONDANSETRON HCL 8 MG PO TABS: 8.0000 mg | ORAL_TABLET | Freq: Three times a day (TID) | ORAL | 2 refills | Status: DC | PRN

## 2022-03-04 NOTE — Progress Notes (Signed)
Provided one complimentary case of ensure plus HP. 

## 2022-03-04 NOTE — Progress Notes (Signed)
[place]  Telephone:(336) [phone] Fax:(336) [phone]   Name: David [name] Date: 03/04/2022 MRN: [id number]  DOB: 1975/12/17  Patient Care Team: Delene Ruffini, MD as PCP - General (Internal Medicine) [name], [name], RN as Oncology Nurse Navigator [name], [name], DO as Consulting Physician (Otolaryngology) [name], MD as Consulting Physician (Radiation Oncology) [name], MD as Consulting Physician (Hematology and Oncology) Pickenpack-Cousar, Carlena Sax, NP as Nurse Practitioner (Nurse Practitioner)    REASON FOR CONSULTATION: [name] is a 47 y.o. 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 He was recently hospitalized for several months at [facility] where he received treatment for fungal endocarditis and required emergent tracheostomy and PEG tube. During hospitalization CT of chest showed concerns for residual vs recurrent tumor. Palliative requested to re-engage for ongoing support and goals of care.    SOCIAL HISTORY:     reports that he has been smoking cigarettes. He has been smoking an average of 3 packs per day. He has never used smokeless tobacco. He reports that he does not currently use alcohol after a past usage of about 12.0 standard drinks of alcohol per week. He reports that he does not currently use drugs after having used the following drugs: IV.  ADVANCE DIRECTIVES:  Patient reports he does not have advanced directives.  Would like to further discuss in the future.  CODE STATUS: Full code  PAST MEDICAL HISTORY: Past Medical History:  Diagnosis Date   Acute respiratory failure with hypoxia (HCC)    Aspiration pneumonia of right lower lobe (HCC) 06/06/2021    Bacterial endocarditis    Chronic HFrEF (heart failure with reduced ejection fraction) ([place]) 06/09/2021   Community acquired pneumonia due to Pneumococcus ([place]) 04/01/2021   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[place])    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 ([place])    Pneumococcal bacteremia 06/09/2021   Pneumothorax    Left lung spontaneous pneumothorax at age 30 yr     PAST SURGICAL HISTORY:  Past Surgical History:  Procedure Laterality Date   chest tubes Left    IR GASTROSTOMY TUBE MOD SED  06/18/2021   LAPAROSCOPIC INSERTION GASTROSTOMY TUBE N/A 06/20/2021   Procedure: LAPAROSCOPIC INSERTION GASTROSTOMY TUBE;  Surgeon: Clovis Riley, MD;  Location: [place];  Service: General;  Laterality: N/A;  DOW PATIENT   TRACHEOSTOMY TUBE PLACEMENT N/A 06/09/2021   Procedure: AWAKE TRACHEOSTOMY;  Surgeon: Melida Quitter, MD;  Location: [place];  Service: ENT;  Laterality: N/A;      ALLERGIES:  has No Known Allergies.  MEDICATIONS:  Current Outpatient Medications  Medication Sig Dispense Refill   fluconazole (DIFLUCAN) 200 MG tablet Take 4 tablets (800 mg total) by mouth daily. 120 tablet 3   guaiFENesin (MUCINEX) 600 MG 12 hr tablet Take 2 tablets (1,200 mg total) by mouth 2 (two) times daily. (Patient not taking: Reported on 01/11/2022) 60 tablet 1   hydrOXYzine (ATARAX) 25 MG tablet Place 4 tablets (100 mg total) into feeding tube 3 (three) times daily as needed for anxiety. (Patient not taking: Reported on 01/11/2022) 60 tablet 5   lidocaine (LIDODERM) 5 % Place 1 patch onto the skin daily. Remove & Discard patch within 12 hours or as directed by MD 30 patch 2   lidocaine (XYLOCAINE) 2 % solution Patient: Mix 1part 2% viscous lidocaine, 1part  H20. Swish and spit 63m of diluted mixture up to eight times a day, prn soreness 200 mL 5   melatonin 3 MG TABS tablet Take 1 tablet (3 mg total) by mouth at bedtime as needed. (Patient not taking: Reported on 01/11/2022) 90 tablet 2   methadone (DOLOPHINE) 10 MG/ML  solution Take 6 mLs (60 mg total) by mouth daily. (Patient taking differently: Take 90 mg by mouth daily.) 90 mL 0   midodrine (PROAMATINE) 5 MG tablet Take 1 tablet (5 mg total) by mouth 3 (three) times daily with meals. 100 tablet 6   Multiple Vitamin (MULTIVITAMIN WITH MINERALS) TABS tablet Place 1 tablet into feeding tube daily. 90 tablet 2   nicotine (NICODERM CQ - DOSED IN MG/24 HOURS) 14 mg/24hr patch Place 1 patch (14 mg total) onto the skin daily. Apply 21 mg patch daily x 6 wk, then '14mg'$  patch daily x 2 wk, then 7 mg patch daily x 2 wk (Patient not taking: Reported on 01/11/2022) 14 patch 0   ondansetron (ZOFRAN) 8 MG tablet Take 1 tablet (8 mg total) by mouth every 8 (eight) hours as needed for nausea or vomiting. Starting day 3 after chemotherapy (Patient not taking: Reported on 01/11/2022) 30 tablet 2   Oxycodone HCl 10 MG TABS Take 1 tablet (10 mg total) by mouth every 6 (six) hours. (Patient not taking: Reported on 01/11/2022) 30 tablet 0   pantoprazole (PROTONIX) 40 MG tablet Take 1 tablet (40 mg total) by mouth daily. (Patient not taking: Reported on 01/11/2022) 90 tablet 3   phenol (CHLORASEPTIC) 1.4 % LIQD Use as directed 1 spray in the mouth or throat as needed for throat irritation / pain. (Patient not taking: Reported on 01/11/2022) 177 mL 5   polyethylene glycol powder (GLYCOLAX/MIRALAX) 17 GM/SCOOP powder Place 17 g in water and drink daily. (Patient not taking: Reported on 01/11/2022) 238 g 3   Pregabalin 20 MG/ML SOLN Take 20 mg by mouth 3 (three) times daily. (Patient not taking: Reported on 01/11/2022) 60 mL 1   thiamine (VITAMIN B1) 100 MG tablet Place 1 tablet (100 mg total) into feeding tube daily. (Patient not taking: Reported on 01/11/2022) 90 tablet 2   ZINC OXIDE, TOPICAL, 10 % CREA Apply 1 Application topically 2 (two) times daily. (Patient not taking: Reported on 01/11/2022)  0   No current facility-administered medications for this visit.    VITAL SIGNS: There  were no vitals taken for this visit. There were no vitals filed for this visit.     Estimated body mass index is 13.99 kg/m as calculated from the following:   Height as of 03/01/22: '5\' 11"'$  (1.803 m).   Weight as of 03/01/22: 100 lb 4.8 oz (45.5 kg).  PERFORMANCE STATUS (ECOG) : 1 - Symptomatic but completely ambulatory  Physical Exam General: NAD, cachectic Cardiovascular: regular rate and rhythm Pulmonary: normal breathing pattern  Abdomen: soft, tender, + bowel sounds Extremities: no edema, no joint deformities Neurological: AAOx3, mood appropriate   IMPRESSION:  Mr. CSivilspresents to clinic for follow-up today.  His mother is present.  No acute distress.  Patient does endorse ongoing fatigue.  They are both recovering from recent influenza A.  States he did utilize Tamiflu as prescribed.  Unfortunately he was unable to have teeth removed due to changes in dentist.  He does have another appointment scheduled for March 21.  This is also affected his ability to eat and appetite.  Appetite continues to fluctuate.  Some days are  better than others.  He has lost a few pounds since previous visit.  Current weight is 94 pounds.  Down from 100 pounds on 2/2.  Contributes some of this due to his recent viral course.  Education provided on nutrition and protein intake.  Patient is drinking several Ensure daily.  He was provided with case of Ensure by dietitian.  Continues to tolerate pregabalin 20 mg 3 times daily.  Denies any unwanted side effects.  Shares his pain and discomfort is well-controlled on current regimen.  Also continues to follow with [facility] for his daily methadone treatment.  Refill sent for ondansetron.  States he had some nausea over the past several days.  We will continue to closely monitor and support as needed.    PLAN:  Emotional support. Lidocaine patch to back  Pregabalin 20 mg 3 times daily  Education provided on nutrition in the setting of poor  dentition.  I will plan to see patient back in the clinic in 4-6 weeks in collaboration with his other oncology appointments.   Patient and mother expressed understanding and was in agreement with this plan. He also understands that He can call the clinic at any time with any questions, concerns, or complaints.        Any controlled substances utilized were prescribed in the context of palliative care. PDMP has been reviewed.    Time Total: 45 min  Visit consisted of counseling and education dealing with the complex and emotionally intense issues of symptom management and palliative care in the setting of serious and potentially life-threatening illness.Greater than 50%  of this time was spent counseling and coordinating care related to the above assessment and plan.  Alda Lea, AGPCNP-BC  Palliative Medicine Team/[facility]

## 2022-03-19 ENCOUNTER — Telehealth: Payer: Self-pay

## 2022-03-19 ENCOUNTER — Encounter: Payer: Self-pay | Admitting: Student

## 2022-03-19 ENCOUNTER — Ambulatory Visit (INDEPENDENT_AMBULATORY_CARE_PROVIDER_SITE_OTHER): Payer: Medicaid Other | Admitting: Student

## 2022-03-19 ENCOUNTER — Other Ambulatory Visit: Payer: Self-pay

## 2022-03-19 VITALS — BP 116/77 | HR 90 | Temp 98.1°F | Ht 70.0 in | Wt 94.0 lb

## 2022-03-19 DIAGNOSIS — Z0279 Encounter for issue of other medical certificate: Secondary | ICD-10-CM | POA: Diagnosis present

## 2022-03-19 DIAGNOSIS — Z0000 Encounter for general adult medical examination without abnormal findings: Secondary | ICD-10-CM

## 2022-03-19 DIAGNOSIS — Z0289 Encounter for other administrative examinations: Secondary | ICD-10-CM

## 2022-03-19 HISTORY — DX: Encounter for general adult medical examination without abnormal findings: Z00.00

## 2022-03-19 NOTE — Assessment & Plan Note (Addendum)
Patient here today as he needs a letter as he is not able to make it to court tomorrow as he has a Scientist, research (life sciences). He was told by the court that he will need a letter. From chart review, patient does have a Therapist, nutritional appointment tomorrow. Will write the patient a letter to excuse him from court for tomorrow.   Plan: -Letter provided

## 2022-03-19 NOTE — Patient Instructions (Signed)
Imothy, Bland you for allowing me to take part in your care today.  Here are your instructions.  1. Attached to this is your letter.   Thank you, Dr. Posey Pronto  If you have any other questions please contact the internal medicine clinic at [phone]

## 2022-03-19 NOTE — Progress Notes (Signed)
   CC: Encounter for letter   HPI:  [name] is a 47 y.o. male with a past medical history below presenting for evaluation for medical letter.   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 Remove & Discard patch within 12 hours or as directed by MD, Disp: 30 patch, Rfl: 2   lidocaine (XYLOCAINE) 2 % solution, Patient: Mix 1part 2% viscous lidocaine, 1part H20. Swish and spit 44m of diluted mixture up to eight times a day, prn soreness, Disp: 200 mL, Rfl: 5   methadone (DOLOPHINE) 10 MG/ML solution, Take 6 mLs (60 mg total) by mouth daily. (Patient taking differently: Take 90 mg by mouth daily.), Disp: 90 mL, Rfl: 0   midodrine (PROAMATINE) 5 MG tablet, Take 1 tablet (5 mg total) by mouth 3 (three) times daily with meals., Disp: 100 tablet, Rfl: 6   ondansetron (ZOFRAN) 8 MG tablet, Take 1 tablet (8 mg total) by mouth every 8 (eight) hours as needed for nausea or vomiting. Starting day 3 after chemotherapy, Disp: 30 tablet, Rfl: 2   Pregabalin 20 MG/ML SOLN, Take 20 mg by mouth 3 (three) times daily., Disp: 60 mL, Rfl: 1  Review of Systems:  Negative except for what is stated in HPI.    Physical Exam:  Vitals:   03/19/22 1448  BP: 116/77  Pulse: 90  Temp: 98.1 F (36.7 C)  TempSrc: Oral  SpO2: 100%   Weight: 94 lb (42.6 kg)  Height: 5' 10"$  (1.778 m)    General: Patient is sitting comfortably in the room  Neck: Trach in place Pulm: Normal breathing on Room air   Assessment & Plan:   Evaluation by medical service required Patient here today as he needs a letter as he is not able to make it to court tomorrow as he has a TScientist, research (life sciences) He was told by the court that he will need a letter. From chart review, patient does have a TTherapist, nutritionalappointment tomorrow. Will write the patient a letter to excuse him from court for tomorrow.   Plan: -Letter provided   Patient discussed with Dr. MWyn Forster DO PGY-1 Internal Medicine Resident  Pager: [phone]

## 2022-03-19 NOTE — Telephone Encounter (Signed)
Pt mother called for pt excuse from court. Letter left at front desk and dixie made aware.

## 2022-03-20 ENCOUNTER — Ambulatory Visit (HOSPITAL_COMMUNITY)
Admission: RE | Admit: 2022-03-20 | Discharge: 2022-03-20 | Disposition: A | Discharge status: Home / Self Care | Payer: Medicaid Other | Source: Ambulatory Visit | Attending: Acute Care | Admitting: Acute Care

## 2022-03-20 DIAGNOSIS — Z923 Personal history of irradiation: Secondary | ICD-10-CM | POA: Diagnosis not present

## 2022-03-20 DIAGNOSIS — R609 Edema, unspecified: Secondary | ICD-10-CM | POA: Insufficient documentation

## 2022-03-20 DIAGNOSIS — Z930 Tracheostomy status: Secondary | ICD-10-CM | POA: Diagnosis not present

## 2022-03-20 DIAGNOSIS — C329 Malignant neoplasm of larynx, unspecified: Secondary | ICD-10-CM | POA: Diagnosis not present

## 2022-03-20 NOTE — Progress Notes (Signed)
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 He completed XRT 02/09/21. Recently been referred to [facility] from local ENT for persistent/residual disease in his neck and larynx. ENT from [facility]: evaluated 10/12. Found globally edematous Larynx, tissue was biopsed  I last saw David [name] on 1/17 Comes today for planned trach change. 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   Comes in today for planned trach change   Interval review of records 10/26 heme/onc f/u: ET/CT which was suggestive of possible residual disease but exploration and biopsy showed no evidence of malignancy which is very reassuring. Plan was to see onc in a year 11/9 out-pt SLP recommended he continue asp precautions and exercises  11/14 seen by ENT  Procedure: Flexible fiberoptic laryngoscopy  "The right pyriform is effaced but no visible tumor seen. The epiglottis is edematous, as is the right aryepiglottic fold. I cannot see any visible ulcers or tumor of the endolarynx although visualization was difficult due to edema. Vocal fold mobility was not seen on the right. Airway very narrow due to the right arytenoid/AE fold edema" 2/13 seen by ENT. Ordered CT but did not see evidence of active disease    Review of Systems  Constitutional: Negative.   HENT: Negative.    Eyes: Negative.   Respiratory:         Still coughing up occasional plugs but this is better  Cardiovascular: Negative.   Gastrointestinal: Negative.   Genitourinary: Negative.   Musculoskeletal: Negative.   Skin: Negative.     Exam  Frail 47 year old male currently in no distress HENT #4 cuffless trach. It is unremarkable at stoma site. Does still have thick dry mucous plugs he is coughing up now  Pulm scattered rhonchi, no accessory use  Card  rrr Abd soft  Ext warm and dry brisk cr   Procedure: The # 4 trach was removed. Site inspected.. was unremarkable. A new trach was placed over obturator w/out difficulty thru the existing stoma. Pt tolerated well placement verified via etco2. He tolerated this change well.    Active Problems:   Tracheostomy status ([place])   Laryngeal cancer ([place])  Tracheostomy status ([place]) Overview: Brand: shiley Size: 4 cuffless  Last change in clinic: 1/17  Discussion Stable trach.Mucous plugging a little better.He just started using his humidification recently. He is getting stronger. The fact that he is able to cough out the mucous plugs makes me think we may yet be able to decannulate him one day.   Plan ROV 4 weeks Cont PMV as tolerated Humidification of trach at HS Encouraged him to increase water intake  mucinex.  Will see how his next imaging looks. Might be able to start capping trials soon.       My time 20 min   Erick Colace ACNP-BC [place] Pager # [phone] OR # [phone] if no answer

## 2022-03-20 NOTE — Progress Notes (Signed)
Tracheostomy Procedure Note  David [name] [id number] 1975/08/28  Pre Procedure Tracheostomy Information  Trach Brand: Shiley Size:  4.0 [id number] Style: Uncuffed Secured by: Velcro   Procedure: Trach Cleaning and trach change    Post Procedure Tracheostomy Information  Trach Brand: Shiley Size:  4.0 [id number] Style: Uncuffed Secured by: Velcro   Post Procedure Evaluation:  ETCO2 positive color change from yellow to purple : Yes.   Vital signs:VSS Patients current condition: stable Complications: No apparent complications Trach site exam: clean, dry Wound care done: 4 x 4 gauze drain Patient did tolerate procedure well.   Education: none  Prescription needs: none    Additional needs: New PMV given to patient at this visit

## 2022-03-21 NOTE — Progress Notes (Signed)
Internal Medicine Clinic Attending  Case discussed with Dr. Posey Pronto  At the time of the visit.  We reviewed the resident's history and exam and pertinent patient test results.  I agree with the assessment, diagnosis, and plan of care documented in the resident's note.    Patient's main reason for visit today is requesting a doctor's note. He has a court appointment at the same time as a pulmonary/trach appointment tomorrow. We provided a note stating that he has a doctor's appointment tomorrow.

## 2022-04-12 ENCOUNTER — Telehealth: Payer: Self-pay | Admitting: Nurse Practitioner

## 2022-04-12 NOTE — Progress Notes (Unsigned)
[facility]  [phone] [phone]   Name: David [name] Date: 04/12/2022 MRN: [id number]  DOB: 15-Jan-1976  Patient Care Team: Delene Ruffini, MD as PCP - General (Internal Medicine) [name], [name], RN as Oncology Nurse Navigator [name], [name], DO as Consulting Physician (Otolaryngology) [name], MD as Consulting Physician (Radiation Oncology) [name], MD as Consulting Physician (Hematology and Oncology) Pickenpack-Cousar, Carlena Sax, NP as Nurse Practitioner (Nurse Practitioner)    REASON FOR CONSULTATION: David [name] is a 47 y.o. 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 He was recently hospitalized for several months at [facility] where he received treatment for fungal endocarditis and required emergent tracheostomy and PEG tube. During hospitalization CT of chest showed concerns for residual vs recurrent tumor. Palliative requested to re-engage for ongoing support and goals of care.    SOCIAL HISTORY:     reports that he has been smoking cigarettes. He has been smoking an average of 3 packs per day. He has never used smokeless tobacco. He reports that he does not currently use alcohol after a past usage of about 12.0 standard drinks of alcohol per week. He reports that he does not currently use drugs after having used the following drugs: IV.  ADVANCE DIRECTIVES:  Patient reports he does not have advanced directives.  Would like to further discuss in the future.  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 ([place])    Pneumococcal bacteremia 06/09/2021   Pneumothorax    Left lung spontaneous pneumothorax at age 55 yr     PAST SURGICAL HISTORY:  Past Surgical History:  Procedure Laterality Date   chest tubes Left    IR GASTROSTOMY TUBE MOD SED  06/18/2021   LAPAROSCOPIC INSERTION GASTROSTOMY TUBE N/A 06/20/2021   Procedure: LAPAROSCOPIC INSERTION GASTROSTOMY TUBE;  Surgeon: Clovis Riley, MD;  Location: [facility];  Service: General;  Laterality: N/A;  DOW PATIENT   TRACHEOSTOMY TUBE PLACEMENT N/A 06/09/2021   Procedure: AWAKE TRACHEOSTOMY;  Surgeon: Melida Quitter, MD;  Location: [facility];  Service: ENT;  Laterality: N/A;      ALLERGIES:  has No Known Allergies.  MEDICATIONS:  Current Outpatient Medications  Medication Sig Dispense Refill   fluconazole (DIFLUCAN) 200 MG tablet Take 4 tablets (800 mg total) by mouth daily. 120 tablet 3   lidocaine (LIDODERM) 5 % Place 1 patch onto the skin daily. Remove & Discard patch within 12 hours or as directed by MD 30 patch 2   lidocaine (XYLOCAINE) 2 % solution Patient: Mix 1part 2% viscous lidocaine, 1part H20. Swish and spit 19mL of diluted mixture up to eight times a day, prn soreness 200 mL 5   methadone (DOLOPHINE) 10 MG/ML solution Take 6 mLs (60 mg total) by mouth daily. (Patient taking differently: Take 90 mg by mouth daily.) 90 mL 0   midodrine (PROAMATINE) 5 MG tablet Take 1 tablet (5 mg total) by mouth 3 (  three) times daily with meals. 100 tablet 6   ondansetron (ZOFRAN) 8 MG tablet Take 1 tablet (8 mg total) by mouth every 8 (eight) hours as needed for nausea or vomiting. Starting day 3 after chemotherapy 30 tablet 2   Pregabalin 20 MG/ML SOLN Take 20 mg by mouth 3 (three) times daily. 60 mL 1   No  current facility-administered medications for this visit.    VITAL SIGNS: There were no vitals taken for this visit. There were no vitals filed for this visit.     Estimated body mass index is 13.49 kg/m as calculated from the following:   Height as of 03/19/22: 5\' 10"  (1.778 m).   Weight as of 03/19/22: 94 lb (42.6 kg).  PERFORMANCE STATUS (ECOG) : 1 - Symptomatic but completely ambulatory  Physical Exam General: NAD, cachectic HEENT: Trach/Passy Muir valve in place Cardiovascular: regular rate and rhythm Pulmonary: normal breathing pattern  Abdomen: soft, tender, + bowel sounds Extremities: no edema, no joint deformities Neurological: AAOx3, mood appropriate   IMPRESSION: David [name] presents to clinic today for follow-up.  No acute distress noted.  His mother is present.  Is remaining as active as possible.  Endorses ongoing fatigue and just not "feeling well" at times.   his mother shares unfortunately he has canceled his upcoming appointment to have his teeth removed after waiting several months.  David [name] shares he is just not feeling well and does not wish to endure any more medical "things" at this time.  Reports overall appetite is good.  Denies nausea, vomiting, constipation, or diarrhea.  Current weight is 93.9 pounds stable from 94 pounds on 2/5.  He has decreased from 100 pounds on 2/2.  Continues to have ongoing discomfort however feels this is relieved with his Lyrica.  Patient continues to go to [facility] for methadone management.  No other symptom needs at this time.  Patient and mother knows to contact office if needed.   PLAN:  Emotional support. Lidocaine patch to back  Pregabalin 20 mg 3 times daily  Education provided on nutrition in the setting of poor dentition.  I will plan to see patient back in the clinic in 4-6 weeks in collaboration with his other oncology appointments.   Patient and mother expressed understanding and was in agreement with this plan. He  also understands that He can call the clinic at any time with any questions, concerns, or complaints.        Any controlled substances utilized were prescribed in the context of palliative care. PDMP has been reviewed.    Time Total: 20 min   Visit consisted of counseling and education dealing with the complex and emotionally intense issues of symptom management and palliative care in the setting of serious and potentially life-threatening illness.Greater than 50%  of this time was spent counseling and coordinating care related to the above assessment and plan.  Alda Lea, AGPCNP-BC  Palliative Medicine Team/ [place]

## 2022-04-12 NOTE — Telephone Encounter (Signed)
Per 3/15 IB reached out to patient to answer questions about upcoming appointments.

## 2022-04-15 ENCOUNTER — Ambulatory Visit (HOSPITAL_COMMUNITY)
Admission: RE | Admit: 2022-04-15 | Discharge: 2022-04-15 | Disposition: A | Discharge status: Home / Self Care | Payer: Medicaid Other | Source: Ambulatory Visit | Attending: Acute Care | Admitting: Acute Care

## 2022-04-15 DIAGNOSIS — Z930 Tracheostomy status: Secondary | ICD-10-CM | POA: Diagnosis not present

## 2022-04-15 DIAGNOSIS — C329 Malignant neoplasm of larynx, unspecified: Secondary | ICD-10-CM | POA: Diagnosis present

## 2022-04-15 NOTE — Progress Notes (Signed)
Tracheostomy Procedure Note  Nas Dudziak [id number] [birth year]  Pre Procedure Tracheostomy Information  Trach Brand: Shiley Size:  4.0 [id number] Style: Uncuffed Secured by: Velcro   Procedure: Trach Change and Trach Cleaning    Post Procedure Tracheostomy Information  Trach Brand: Shiley Size:  4.0 [id number] Style: Uncuffed Secured by: Velcro   Post Procedure Evaluation:  ETCO2 positive color change from yellow to purple : Yes.   Vital signs:VSS Patients current condition: stable Complications: No apparent complications Trach site exam: clean, dry Wound care done: site cleaned with sterile saline and 4 x 4 gauze drain placed Patient did tolerate procedure well.   Education: none  Prescription needs: none    Additional needs: Patient given new PMV at this visit

## 2022-04-15 NOTE — Progress Notes (Signed)
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 10/26 heme/onc f/u: ET/CT which was suggestive of possible residual disease but exploration and biopsy showed no evidence of malignancy which is very reassuring. Plan was to see onc in a year 11/9 out-pt SLP recommended he continue asp precautions and exercises  11/14 seen by ENT Procedure: Flexible fiberoptic laryngoscopy  "The right pyriform is effaced but no visible tumor seen. The epiglottis is edematous, as is the right aryepiglottic fold. I cannot see any visible ulcers or tumor of the endolarynx although visualization was difficult due to edema. Vocal fold mobility was not seen on the right. Airway very narrow due to the right arytenoid/AE fold edema" 2/13 seen by ENT. Ordered CT but did not see evidence of active disease  2/21 seen trach clinic. Holding off on moving towards decannulation as pt still had evidence of air trapping w/ trach occlusion.  3/18 presents for planned trach change. No sig new events since last visit. Feels like secretions better.   Review of Systems  Constitutional:  Positive for malaise/fatigue. Negative for fever.  HENT: Negative.    Eyes: Negative.   Respiratory: Negative.    Cardiovascular: Negative.   Gastrointestinal: Negative.   Genitourinary: Negative.   Musculoskeletal:        Reports neck discomfort   Skin: Negative.   Neurological: Negative.   Endo/Heme/Allergies: Negative.     Exam   General 46 year old male presents to clinic w/out any distress HENT NCAT #4 cuffless phonation still breathy and weak. Still has sig air trapping w/ red capping valve and endorses SOB w/ it in place Pulm clear  Card rrr Abd soft  Ext warm  Neuro intact   Procedure: The # 4 cuffless trach was removed. New trach placed over obturator. Pt tolerated well. Placement confirmed via ETCO2   Active Problems:   Tracheostomy status ([place])   Laryngeal cancer  ([place])  Laryngeal cancer ([place]) Overview: Completed XRT Currently felt to have no evidence of active cancer per notes from ENT on 11/14.  Plan F/u w/ both ENT and onc as scheduled    Tracheostomy status ([place]) Overview: Brand: shiley Size: 4 cuffless  Last change in clinic: 3/18  Discussion Trach is unremarkable. Still has sig air trapping w/ capping valve   Plan ROV 4 weeks Cont to check capping trials ->I am hopeful that he will one day be able to be decannulated but as of today he is not ready       My time 20 min   David [name] ACNP-BC [place] Pager # [phone] OR # [phone] if no answer

## 2022-04-16 ENCOUNTER — Other Ambulatory Visit: Payer: Self-pay

## 2022-04-16 ENCOUNTER — Encounter: Payer: Self-pay | Admitting: Nurse Practitioner

## 2022-04-16 ENCOUNTER — Inpatient Hospital Stay: Payer: Medicaid Other | Attending: Radiation Oncology | Admitting: Nurse Practitioner

## 2022-04-16 VITALS — BP 127/86 | HR 83 | Temp 98.5°F | Resp 15 | Ht 70.0 in | Wt 93.6 lb

## 2022-04-16 DIAGNOSIS — G893 Neoplasm related pain (acute) (chronic): Secondary | ICD-10-CM | POA: Diagnosis not present

## 2022-04-16 DIAGNOSIS — Z515 Encounter for palliative care: Secondary | ICD-10-CM | POA: Diagnosis not present

## 2022-04-16 DIAGNOSIS — C329 Malignant neoplasm of larynx, unspecified: Secondary | ICD-10-CM

## 2022-04-16 MED ORDER — PREGABALIN 20 MG/ML PO SOLN: 20.0000 mg | Freq: Three times a day (TID) | ORAL | 1 refills | Status: DC

## 2022-04-26 ENCOUNTER — Other Ambulatory Visit: Payer: Medicaid Other

## 2022-05-08 ENCOUNTER — Ambulatory Visit: Payer: Medicaid Other | Admitting: Internal Medicine

## 2022-05-09 ENCOUNTER — Encounter: Payer: Self-pay | Admitting: Internal Medicine

## 2022-05-09 ENCOUNTER — Ambulatory Visit (INDEPENDENT_AMBULATORY_CARE_PROVIDER_SITE_OTHER): Payer: Medicaid Other | Admitting: Internal Medicine

## 2022-05-09 ENCOUNTER — Other Ambulatory Visit: Payer: Self-pay

## 2022-05-09 VITALS — BP 89/64 | HR 59 | Temp 98.6°F | Resp 16 | Wt 98.6 lb

## 2022-05-09 DIAGNOSIS — B376 Candidal endocarditis: Secondary | ICD-10-CM | POA: Diagnosis not present

## 2022-05-09 DIAGNOSIS — B49 Unspecified mycosis: Secondary | ICD-10-CM

## 2022-05-09 NOTE — Progress Notes (Signed)
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   Continuous tobacco abuse 11/21/2021   Dermatitis associated with moisture 10/16/2021   Back pain 08/16/2021   Candidal endocarditis    History of radiation to head and neck region 07/11/2021   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   Dysgeusia 07/11/2021   S/P percutaneous endoscopic gastrostomy (PEG) tube placement 06/24/2021   Pressure injury of skin 06/17/2021   Tracheostomy status    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   Heart failure with reduced ejection fraction 04/28/2021   Elevated troponin 04/01/2021   Elevated transaminase level 04/01/2021   Weight loss, unintentional 01/15/2021   Protein-calorie malnutrition, severe 01/10/2021   Hypotension 01/08/2021   AKI (acute kidney injury) 01/08/2021   Chemotherapy induced nausea and vomiting 01/01/2021   Encounter for dental examination 12/13/2020   Laryngeal cancer 12/04/2020   Teeth missing 12/04/2020   Radiation caries 12/04/2020   Retained tooth root 12/04/2020   Chronic apical periodontitis 12/04/2020   Accretions on teeth 12/04/2020      Subjective:    Patient ID: David [name], male    DOB: 28-May-1975, 47 y.o.   MRN: [id number]  Chief Complaint  Patient presents with   Follow-up    Acute candidal endocarditis       HPI:  David [name] is a 47 y.o. male with head neck cancer s/p trach/prior surgery/chemoxrt, here for  hospital f/u tv endocarditis with candida albican (also ad hx strep pna mv endocarditis 05/2021 s/p treatment)   He received micafungin from 08/21/21 to jul 25, and then was transitioned to fluconazole 800 mg daily. Missed dose yesterday as out of med No side effect No fever, chill, rash  He is in in active f/u with [facility] for what could possibly be recurrent head/neck cancer. He has been in remission 3-4 months  He is being followed by palliative care team and is asking me if I can do with his chronic back pain/trach site pain  ----------------- 11/07/21 id clinic f/u Patient feeling well He has recent pet scan [facility] and this demonstrate some concerning lesion in his throat and this awaits biopsy. He takes 800 mg fluconazole daily and tolerates it well Again at this time no port/central catheter  No hair loss, n/v, rash, ruq pain, jaundice   02/06/22 id f/u Tolerating fluconazole, down to 400 mg once a day, compliant Missing 200 mg here and there sometimes due to size/nausea Otherwise no issue Wants handicap placcard application filled out   05/09/22 id f/u Doing well on fluconazole 400 mg daily No hair loss No active issue going on outside of "trach being annoying."   No Known Allergies    Outpatient Medications Prior to Visit  Medication Sig Dispense Refill   fluconazole (DIFLUCAN) 200 MG tablet Take 4 tablets (800 mg total) by mouth daily. 120 tablet 3   lidocaine (LIDODERM) 5 % Place 1 patch onto the skin daily. Remove & Discard patch within 12 hours or as directed by MD 30 patch  2   lidocaine (XYLOCAINE) 2 % solution Patient: Mix 1part 2% viscous lidocaine, 1part H20. Swish and spit 10mL of diluted mixture up to eight times a day, prn soreness 200 mL 5   methadone (DOLOPHINE) 10 MG/ML solution Take 6 mLs (60 mg total) by mouth daily. (Patient taking differently: Take 90 mg by mouth daily.) 90 mL 0   ondansetron (ZOFRAN) 8 MG tablet Take 1 tablet (8 mg  total) by mouth every 8 (eight) hours as needed for nausea or vomiting. Starting day 3 after chemotherapy 30 tablet 2   midodrine (PROAMATINE) 5 MG tablet Take 1 tablet (5 mg total) by mouth 3 (three) times daily with meals. (Patient not taking: Reported on 05/09/2022) 100 tablet 6   Pregabalin 20 MG/ML SOLN Take 20 mg by mouth 3 (three) times daily. (Patient not taking: Reported on 05/09/2022) 60 mL 1   No facility-administered medications prior to visit.     Social History   Socioeconomic History   Marital status: Divorced    Spouse name: Not on file   Number of children: Not on file   Years of education: Not on file   Highest education level: Not on file  Occupational History   Not on file  Tobacco Use   Smoking status: Every Day    Packs/day: 3    Types: Cigarettes   Smokeless tobacco: Never   Tobacco comments:    10 cigs per day  Vaping Use   Vaping Use: Never used  Substance and Sexual Activity   Alcohol use: Not Currently    Alcohol/week: 12.0 standard drinks of alcohol    Types: 12 Cans of beer per week   Drug use: Not Currently    Types: IV    Comment: 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)   Sexual activity: Not on file  Other Topics Concern   Not on file  Social History Narrative   Not on file   Social Determinants of Health   Financial Resource Strain: Low Risk  (03/19/2022)   Overall Financial Resource Strain (CARDIA)    Difficulty of Paying Living Expenses: Not hard at all  Food Insecurity: No Food Insecurity (03/19/2022)   Hunger Vital Sign    Worried About Running Out of Food in the Last Year: Never true    Ran Out of Food in the Last Year: Never true  Transportation Needs: No Transportation Needs (03/19/2022)   PRAPARE - Administrator, Civil ServiceTransportation    Lack of Transportation (Medical): No    Lack of Transportation (Non-Medical): No  Physical Activity: Insufficiently Active (03/19/2022)   Exercise Vital Sign    Days of Exercise per Week: 3 days     Minutes of Exercise per Session: 20 min  Stress: No Stress Concern Present (03/19/2022)   [employer] - Occupational Stress Questionnaire    Feeling of Stress : Only a little  Social Connections: Socially Isolated (03/19/2022)   Social Connection and Isolation Panel [NHANES]    Frequency of Communication with Friends and Family: More than three times a week    Frequency of Social Gatherings with Friends and Family: Three times a week    Attends Religious Services: Never    Active Member of Clubs or Organizations: No    Attends BankerClub or Organization Meetings: Never    Marital Status: Divorced  Catering managerntimate Partner Violence: Not At Risk (03/19/2022)   Humiliation, Afraid, Rape, and Kick questionnaire    Fear of Current or Ex-Partner: No  Emotionally Abused: No    Physically Abused: No    Sexually Abused: No      Review of Systems    All other ros negative  Objective:    BP (!) 89/64   Pulse (!) 59   Temp 98.6 F (37 C) (Oral)   Resp 16   Wt 98 lb 9.6 oz (44.7 kg)   SpO2 97%   BMI 14.15 kg/m  Nursing note and vital signs reviewed.  Physical Exam     Blood pressure (!) 89/64, pulse (!) 59, temperature 98.6 F (37 C), temperature source Oral, resp. rate 16, weight 98 lb 9.6 oz (44.7 kg), SpO2 97 %.  General/constitutional: no distress, pleasant HEENT: Normocephalic, PER, Conj Clear, EOMI, Oropharynx clear Neck supple -- trach site clean no erythema/discharge CV: rrr no mrg Lungs: clear to auscultation, normal respiratory effort Abd: Soft, Nontender Ext: no edema Skin: No Rash Neuro: nonfocal MSK: no peripheral joint swelling/tenderness/warmth; back spines nontender     Labs: Lab Results  Component Value Date   WBC 4.7 02/06/2022   HGB 11.5 (L) 02/06/2022   HCT 34.6 (L) 02/06/2022   MCV 91.5 02/06/2022   PLT 169 02/06/2022   Last metabolic panel Lab Results  Component Value Date   GLUCOSE 111 (H) 02/06/2022   NA 139 02/06/2022    K 4.4 02/06/2022   CL 103 02/06/2022   CO2 24 02/06/2022   BUN 17 02/06/2022   CREATININE 0.71 02/06/2022   GFRNONAA >60 08/16/2021   CALCIUM 9.1 02/06/2022   PHOS 4.2 06/25/2021   PROT 7.7 02/06/2022   ALBUMIN 2.6 (L) 08/13/2021   BILITOT 0.3 02/06/2022   ALKPHOS 93 08/13/2021   AST 51 (H) 02/06/2022   ALT 51 (H) 02/06/2022   ANIONGAP 9 08/16/2021    Micro:  Serology:  Imaging:  Assessment & Plan:   Problem List Items Addressed This Visit       Cardiovascular and Mediastinum   Candidal endocarditis   Other Visit Diagnoses     Fungemia    -  Primary      No orders of the defined types were placed in this encounter.    Fungal tv endocarditis Candida albican Will continue his fluconazole high dose 800 mg for now. Potentially transition to lower dose at another 3-6 months No side effect at this time clinically  Labs today Liquid form fluconazole ordered for a year F/u 4-6 weeks    Ent cancer F/u [facility]  ----- 11/07/21 id assessment Doing well Pending repeat upper oral airway biopsy for pet avid lesion  Would continue high dose 12 mg/kg fluconazole for another 3 months then go to 6 mg/kg Advise patient of periodic once or twice a year ekg and discuss potential drug interaction that could make this worse   02/06/22 assessment Patient had transitioned to 400 mg daily about December 2023. No side effect He asked about if or when he could go off of fluconazole Given his age it is not unreasonable to try, but I would not do for candida endocarditis, at least another year from now (01/2023) He asked for handicap placcard application to be filled out today and we can. Renewal probably needs pcp fill out. Filled out for 6 months  Follow up in 3 months   05/09/22 id assessment Doing well tolerating fluconazole wihtout any side effect Plan to lower dose to 200 mg in 07/2022 and reevaluate in August Potentially trial off 01/2023  I have spent a total  of 30 minutes of face-to-face and non-face-to-face time, excluding clinical staff time, preparing to see patient, ordering tests and/or medications, and provide counseling the patient   Follow-up: Return in about 4 months (around 09/08/2022).      Raymondo Band, MD [facility] 05/09/2022, 11:31 AM

## 2022-05-09 NOTE — Patient Instructions (Signed)
Continue fluconazole 400 mg once a day (2 tab)  On July 15/2024, you can go down to 200 mg fluconazole once a day   Then 01/2023 or sometime near there we may be can consider trial of stopping fluconazole with close follow up

## 2022-05-15 ENCOUNTER — Telehealth: Payer: Self-pay

## 2022-05-15 ENCOUNTER — Inpatient Hospital Stay (HOSPITAL_COMMUNITY)
Admission: RE | Admit: 2022-05-15 | Discharge: 2022-05-15 | Disposition: A | Discharge status: Home / Self Care | Payer: Medicaid Other | Source: Ambulatory Visit

## 2022-05-15 NOTE — Telephone Encounter (Signed)
Palliative Care Note   Palliative care check in call made by volunteer this week. Ensured pt had contact information and encouraged to call [facility] staff if needed anything. Voiced understanding.              Barbette Merino, RN

## 2022-05-16 ENCOUNTER — Ambulatory Visit (HOSPITAL_COMMUNITY)
Admission: RE | Admit: 2022-05-16 | Discharge: 2022-05-16 | Disposition: A | Discharge status: Home / Self Care | Payer: Medicaid Other | Source: Ambulatory Visit | Attending: Acute Care | Admitting: Acute Care

## 2022-05-16 DIAGNOSIS — Z930 Tracheostomy status: Secondary | ICD-10-CM | POA: Diagnosis not present

## 2022-05-16 DIAGNOSIS — J386 Stenosis of larynx: Secondary | ICD-10-CM | POA: Diagnosis present

## 2022-05-16 DIAGNOSIS — R490 Dysphonia: Secondary | ICD-10-CM | POA: Diagnosis present

## 2022-05-16 DIAGNOSIS — E43 Unspecified severe protein-calorie malnutrition: Secondary | ICD-10-CM | POA: Diagnosis present

## 2022-05-16 DIAGNOSIS — Z923 Personal history of irradiation: Secondary | ICD-10-CM

## 2022-05-16 DIAGNOSIS — C329 Malignant neoplasm of larynx, unspecified: Secondary | ICD-10-CM | POA: Diagnosis present

## 2022-05-16 NOTE — Progress Notes (Signed)
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 10/26 heme/onc f/u: ET/CT which was suggestive of possible residual disease but exploration and biopsy showed no evidence of malignancy which is very reassuring. Plan was to see onc in a year 11/9 out-pt SLP recommended he continue asp precautions and exercises  11/14 seen by ENT Procedure: Flexible fiberoptic laryngoscopy  "The right pyriform is effaced but no visible tumor seen. The epiglottis is edematous, as is the right aryepiglottic fold. I cannot see any visible ulcers or tumor of the endolarynx although visualization was difficult due to edema. Vocal fold mobility was not seen on the right. Airway very narrow due to the right arytenoid/AE fold edema" 2/13 seen by ENT. Ordered CT but did not see evidence of active disease  2/21 seen trach clinic. Holding off on moving towards decannulation as pt still had evidence of air trapping w/ trach occlusion.  4/18 presents for planned trach change. No sig new events since last visit. Feels like secretions better. Does report decreased neck ROM  Review of Systems  Constitutional:  Positive for fever. Negative for malaise/fatigue and weight loss.  HENT: Negative.    Eyes: Negative.   Respiratory: Negative.    Cardiovascular: Negative.   Gastrointestinal: Negative.   Skin: Negative.     Exam   General 47 year old male presents to clinic w/out any distress, actually appears to be gaining weight  HENT NCAT #4 cuffless phonation still breathy and weak. Still has sig air trapping when PMV removed. He has limited ROM of his neck. Mildly tilted to right shoulder and not able to straighten midline   Pulm clear  Card rrr Abd soft  Ext warm  Neuro intact   Procedure: The # 4 cuffless trach was removed. New trach placed over obturator. Pt tolerated well. Placement confirmed via ETCO2   Active Problems:   Tracheostomy status   Laryngeal  cancer   Protein-calorie malnutrition, severe   Subglottic stenosis   History of radiation to head and neck region   Phonation disorder  Tracheostomy status Overview: Brand: shiley Size: 4 cuffless  Last change in clinic: 4/18  Discussion Trach is unremarkable. Still has sig air trapping w/ capping valve   Plan ROV 4 weeks Cont PMV as tolerated I am getting less confident that he will be able to be safely decannulated       My time 20 min   Simonne Martinet ACNP-BC [facility] Pulmonary/Critical Care Pager # [phone] OR # [phone] if no answer

## 2022-05-16 NOTE — Progress Notes (Signed)
Tracheostomy Procedure Note  David [name] [id number] [birth year]  Pre Procedure Tracheostomy Information  Trach Brand: Shiley Size:  4.0 [id number] Style: Uncuffed Secured by: Velcro   Procedure: Trach Change and Trach cleaning    Post Procedure Tracheostomy Information  Trach Brand: Shiley Size:  4.0 [id number] Style: Uncuffed Secured by: Velcro   Post Procedure Evaluation:  ETCO2 positive color change from yellow to purple : Yes.   Vital signs:VSS Patients current condition: stable Complications: No apparent complications Trach site exam: clean, dry Wound care done: 4 x 4 gauze drain Patient did tolerate procedure well.   Education: none  Prescription needs: none    Additional needs: New PMV given to patient at this visit Patient also given additional drain gauze and a box #4 inner cannulas

## 2022-05-20 ENCOUNTER — Other Ambulatory Visit: Payer: Self-pay

## 2022-05-20 DIAGNOSIS — Z515 Encounter for palliative care: Secondary | ICD-10-CM

## 2022-05-20 DIAGNOSIS — C321 Malignant neoplasm of supraglottis: Secondary | ICD-10-CM

## 2022-05-20 NOTE — Progress Notes (Signed)
Pt mother, [name], called requesting PT for pt. per Lowella Bandy, NP orders placed.

## 2022-05-30 ENCOUNTER — Ambulatory Visit: Payer: Medicaid Other | Attending: Nurse Practitioner

## 2022-05-30 ENCOUNTER — Other Ambulatory Visit: Payer: Self-pay

## 2022-05-30 DIAGNOSIS — Z515 Encounter for palliative care: Secondary | ICD-10-CM | POA: Insufficient documentation

## 2022-05-30 DIAGNOSIS — C321 Malignant neoplasm of supraglottis: Secondary | ICD-10-CM | POA: Insufficient documentation

## 2022-05-30 DIAGNOSIS — R293 Abnormal posture: Secondary | ICD-10-CM

## 2022-05-30 DIAGNOSIS — M542 Cervicalgia: Secondary | ICD-10-CM | POA: Diagnosis present

## 2022-05-30 NOTE — Therapy (Signed)
OUTPATIENT PHYSICAL THERAPY CERVICAL EVALUATION   Patient Name: David [name] MRN: [id number] DOB:1975/06/12, 47 y.o., male Today's Date: 05/30/2022  END OF SESSION:  PT End of Session - 05/30/22 1010     Visit Number 1    Date for PT Re-Evaluation 07/26/22    Authorization Type Medicaid CCME- auth requested    PT Start Time 0939    PT Stop Time 1008    PT Time Calculation (min) 29 min    Activity Tolerance Patient tolerated treatment well    Behavior During Therapy [facility] for tasks assessed/performed             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)    Pneumococcal bacteremia 06/09/2021   Pneumothorax    Left lung spontaneous pneumothorax at age 70 yr    Past Surgical History:  Procedure Laterality Date   chest tubes Left    IR GASTROSTOMY TUBE MOD SED  06/18/2021   LAPAROSCOPIC INSERTION GASTROSTOMY TUBE N/A 06/20/2021   Procedure: LAPAROSCOPIC INSERTION GASTROSTOMY TUBE;  Surgeon: Berna Bue, MD;  Location: [facility] OR;  Service: General;  Laterality: N/A;  DOW PATIENT   TRACHEOSTOMY TUBE PLACEMENT N/A 06/09/2021   Procedure: AWAKE TRACHEOSTOMY;  Surgeon: Christia Reading, MD;  Location[facility] OR;  Service: ENT;  Laterality: N/A;   Patient Active Problem List   Diagnosis Date Noted   Evaluation by medical service required 03/19/2022   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   Defective dental restoration 12/31/2021   Chronic periodontitis 12/31/2021   Impacted third molar tooth 12/31/2021   Irritant contact dermatitis associated with digestive stoma  12/25/2021   Irritation around percutaneous endoscopic gastrostomy (PEG) tube site (HCC) 12/25/2021   Leaking PEG tube (HCC) 12/12/2021   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   Continuous tobacco abuse 11/21/2021   Dermatitis associated with moisture 10/16/2021   Back pain 08/16/2021   Candidal endocarditis    History of radiation to head and neck region 07/11/2021   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   Dysgeusia 07/11/2021   S/P percutaneous endoscopic gastrostomy (PEG) tube placement (HCC) 06/24/2021   Pressure injury of skin 06/17/2021   Tracheostomy status (HCC)    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   Heart failure with reduced ejection fraction (HCC) 04/28/2021   Elevated troponin 04/01/2021   Elevated transaminase level 04/01/2021   Weight loss, unintentional 01/15/2021   Protein-calorie malnutrition, severe (HCC) 01/10/2021   Hypotension 01/08/2021   AKI (acute kidney injury) (HCC) 01/08/2021   Chemotherapy induced nausea and vomiting 01/01/2021   Encounter for dental examination 12/13/2020   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   Teeth missing 12/04/2020   Radiation caries 12/04/2020   Retained tooth root 12/04/2020   Chronic apical periodontitis 12/04/2020   Accretions on teeth 12/04/2020    PCP: Adron Bene, MD   REFERRING PROVIDER: Glee Arvin, NP   REFERRING DIAG: Diagnosis Information  Diagnosis  C32.1 (ICD-10-CM) - Malignant neoplasm of supraglottis (HCC)  Z51.5 (ICD-10-CM) - Palliative care patient  Eval and treat for neck pain   THERAPY DIAG:  Cervicalgia - Plan: PT plan of care cert/re-cert  Abnormal posture - Plan: PT plan of care cert/re-cert  Rationale for Evaluation and Treatment: Rehabilitation  ONSET DATE: 1 year history (trach placement)  SUBJECTIVE:                                                                                                                                                                                                          SUBJECTIVE STATEMENT: Pt presents to PT with neck pain. Pt has difficulty holding up his head due to pain and postural weakness. Hand dominance: Right  PERTINENT HISTORY:  Tracheostomy, radiation to neck, malignant neoplasm of supraglottis  PAIN:  Are you having pain? Yes: NPRS scale: 3/10 Pain location: Lt>Rt neck and thoracic spine  Pain description: stiff, throbbing Aggravating factors: overstretching Relieving factors: not moving   PRECAUTIONS: Other: cancer, trach  WEIGHT BEARING RESTRICTIONS: No  FALLS:  Has patient fallen in last 6 months? No  LIVING ENVIRONMENT: Lives with: lives alone Lives in: House/apartment   PLOF: Independent, walking to mailbox  PATIENT GOALS: reduce neck pain, improve mobility   NEXT MD VISIT:   OBJECTIVE:   DIAGNOSTIC FINDINGS:  None   PATIENT SURVEYS:  NDI 11/45  COGNITION: Overall cognitive status: Within functional limits for tasks assessed  SENSATION: WFL  POSTURE: rounded shoulders, forward head, increased thoracic kyphosis, and flexed trunk   PALPATION: Significant tension and reduced muscle length in bil neck. Pain on Rt vs Lt.    CERVICAL ROM:   Active ROM A/PROM (deg) eval  Flexion Rests in flexion  Extension   Right lateral flexion Rests in 30 degrees  Left lateral flexion 10 degrees Rt lateral flexion  Right rotation WFLs  Left rotation WFLs   (Blank rows = not tested)  UPPER EXTREMITY ROM:  UE A/ROM is WFLs without pain  UPPER EXTREMITY MMT: 4+/5 bil UE strength, neck strength 4-/5 throughout   TODAY'S TREATMENT:  DATE: 05/30/22 HEP initiated- see below     If treatment provided at initial evaluation, no treatment charged due to lack of authorization.        PATIENT EDUCATION:  Education details: Access Code: [email or web address] Person educated:  Patient Education method: Explanation, Demonstration, and Handouts Education comprehension: verbalized understanding and returned demonstration  HOME EXERCISE PROGRAM: Access Code: [email or web address] URL: [email or web address] Date: 05/30/2022 Prepared by: Tresa Endo  Exercises - Seated Scapular Retraction  - 5 x daily - 7 x weekly - 1 sets - 10 reps - 5 hold - Supine Scapular Retraction  - 3 x daily - 7 x weekly - 1 sets - 10 reps - 5 hold - Seated Correct Posture  - 1 x daily - 7 x weekly - 3 sets - 10 reps - Seated Cervical Retraction  - 1 x daily - 7 x weekly - 3 sets - 10 reps - Supine Chin Tuck  - 1 x daily - 7 x weekly - 3 sets - 10 reps - Seated Cervical Sidebending AROM  - 3 x daily - 7 x weekly - 1 sets - 10 reps - 5-10 hold  ASSESSMENT:  CLINICAL IMPRESSION: Patient is a 47 y.o. male who was seen today for physical therapy evaluation and treatment for neck pain and significant postural dysfunction.  Pt has a trach in place and is on pallative care for throat cancer.  Pt reports that he has not been able to hold his head up for over a year.  He reports that he has pain and stiffness in his neck.  Pt rests in full cervical flexion and Rt lateral flexion (30 degrees).  He is able to correct to 10 degrees Rt sidebending.  Pt with significant muscle tension and pain in bil cervical musculature.  Pt with poor conditioning overall secondary to complex medical history. Patient will benefit from skilled PT to address the below impairments and improve overall function.   OBJECTIVE IMPAIRMENTS: decreased activity tolerance, increased muscle spasms, impaired flexibility, postural dysfunction, and pain.   ACTIVITY LIMITATIONS: sitting, standing, and reach over head  PARTICIPATION LIMITATIONS: meal prep, driving, and community activity  PERSONAL FACTORS: 1-2 comorbidities: cancer on pallative care, chronic postural dysfunction  are also affecting patient's functional outcome.   REHAB  POTENTIAL: Good  CLINICAL DECISION MAKING: Stable/uncomplicated  EVALUATION COMPLEXITY: Moderate   GOALS: Goals reviewed with patient? Yes  SHORT TERM GOALS: Target date: 06/27/2022    Be independent in initial  HEP Baseline:  Goal status: INITIAL  2.  Verbalize and demonstrate postural corrections to improve tolerance for neutral posture and reduced neck strain  Baseline:  Goal status: INITIAL   LONG TERM GOALS: Target date: 07/26/22  Be independent in advanced HEP Baseline:  Goal status: INITIAL  3.  Improve NDI to < or = to 7/45 Baseline: 11/45 Goal status: INITIAL  3.   Perform independent postural correction and hold this x 1-2 minutes  Baseline: needs visual feedback and can hold <20 seconds  Goal status: INITIAL   4.   Report > or = to 40% reduction in neck pain due to reduced tension and strain in neck Baseline: 3-5/10 Goal status: INITIAL    PLAN:  PT FREQUENCY: 1x/week  PT DURATION: 8 weeks  PLANNED INTERVENTIONS: Therapeutic exercises, Therapeutic activity, Neuromuscular re-education, Balance training, Gait training, Patient/Family education, Self Care, Joint mobilization, Aquatic Therapy, Dry Needling, Electrical stimulation, Spinal mobilization, Cryotherapy, Moist heat, Taping, Traction, Manual therapy, and Re-evaluation  PLAN FOR NEXT SESSION: work on  postural reeducation, DN to neck and upper traps and provide with DN info   Lorrene Reid, PT 05/30/22 10:52 AM    [facility] [address], [address] [place], [place] [address] Phone # [phone] Fax [phone]

## 2022-05-31 NOTE — Progress Notes (Signed)
David [name] is here for a follow -up appointment today with Dr. Basilio Cairo.Completed radiation to the larynx on 02-09-2021. Rn Victorino Dike spoke with his mom on 06-13-22 to gather information about how pt was doing. Pt has a trach and has difficulty talking on the phone.   Pain issues, if any: Pain in the back of his neck, his head hangs to the right  Using a feeding tube?: no feeding tube, fell out a couple of months ago, mds are aware of this, eating better now (likes sweets) according to mom Weight changes, if any: mom has concerns about this, he does not weigh himself often at times, his weight fluctuates  Swallowing issues, if any: coughs a lot, Dr. Ezekiel Ina is recommending physical therapy for neck, swallowing has improved. He was recently released from [facility].  Smoking or chewing tobacco? Still smokes, not as much as before Using fluoride toothpaste daily? no Last ENT visit was on: 03-12-22 with Dr. Hezzie Bump Other notable issues, if any: Lymphedema to right neck getting therapy for this. Mom feels like he does look healthy and he wants to sleep often. Pt has trach. 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 Mom states that the doctors state the infection could kill him.   03-12-22 with Dr. Hezzie Bump Procedure: Flexible fiberoptic laryngoscopy Technique: After anesthetizing the nasal cavity with topical lidocaine and oxymetazoline, the flexible endoscope was introduced and passed through the nasal cavity into the nasopharynx. The scope was then advanced to the level of the oropharynx. The posterior soft palate, uvula, tongue base and vallecula were visualized and appeared healthy without mucosal masses or lesions. There are some secretions in the hypopharynx. The right pyriform is effaced but no visible tumor seen. The epiglottis is edematous, as is the right aryepiglottic fold. I cannot see any visible ulcers or tumor of the endolarynx although visualization was difficult due to edema. Vocal fold mobility was  not seen on the right. Airway narrow due to the right arytenoid/AE fold edema, but improved since last visit.. The scope was withdrawn from the nose. He tolerated the procedure well.

## 2022-06-12 ENCOUNTER — Ambulatory Visit (HOSPITAL_COMMUNITY)
Admission: RE | Admit: 2022-06-12 | Discharge: 2022-06-12 | Disposition: A | Discharge status: Home / Self Care | Payer: Medicaid Other | Source: Ambulatory Visit | Attending: Acute Care | Admitting: Acute Care

## 2022-06-12 DIAGNOSIS — C329 Malignant neoplasm of larynx, unspecified: Secondary | ICD-10-CM | POA: Diagnosis present

## 2022-06-12 DIAGNOSIS — Z430 Encounter for attention to tracheostomy: Secondary | ICD-10-CM | POA: Insufficient documentation

## 2022-06-12 DIAGNOSIS — Z930 Tracheostomy status: Secondary | ICD-10-CM | POA: Diagnosis not present

## 2022-06-12 NOTE — Progress Notes (Signed)
Palliative Medicine [facility]  Telephone:([phone]) [phone] Fax:([phone]) [phone]   Name: David [name] Date: 06/12/2022 MRN: [id number]  DOB: 10/08/1975  Patient Care Team: Adron Bene, MD (Inactive) as PCP - General (Internal Medicine) [name], Lise Auer, RN as Oncology Nurse Navigator [name], Skeet Latch, DO as Consulting Physician (Otolaryngology) Lonie Peak, MD as Consulting Physician (Radiation Oncology) Rachel Moulds, MD as Consulting Physician (Hematology and Oncology) Pickenpack-Cousar, Arty Baumgartner, NP as Nurse Practitioner (Nurse Practitioner)    REASON FOR CONSULTATION: David [name] is a 47 y.o. 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 He was recently hospitalized for several months at [facility] where he received treatment for fungal endocarditis and required emergent tracheostomy and PEG tube. During hospitalization CT of chest showed concerns for residual vs recurrent tumor. Palliative requested to re-engage for ongoing support and goals of care.    SOCIAL HISTORY:     reports that he has been smoking cigarettes. He has been smoking an average of 3 packs per day. He has never used smokeless tobacco. He reports that he does not currently use alcohol after a past usage of about 12.0 standard drinks of alcohol per week. He reports that he does not currently use drugs after having used the following drugs: IV.  ADVANCE DIRECTIVES:  Patient reports he does not have advanced directives.  Would like to further discuss in the future.  CODE STATUS: Full code  PAST MEDICAL HISTORY: Past Medical History:  Diagnosis Date   Acute respiratory failure with hypoxia (HCC)    Aspiration pneumonia of right lower lobe (HCC) 06/06/2021    Bacterial endocarditis    Chronic HFrEF (heart failure with reduced ejection fraction) (HCC) 06/09/2021   Community acquired pneumonia due to Pneumococcus (HCC) 04/01/2021   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HCC)    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)    Pneumococcal bacteremia 06/09/2021   Pneumothorax    Left lung spontaneous pneumothorax at age 64 yr     PAST SURGICAL HISTORY:  Past Surgical History:  Procedure Laterality Date   chest tubes Left    IR GASTROSTOMY TUBE MOD SED  06/18/2021   LAPAROSCOPIC INSERTION GASTROSTOMY TUBE N/A 06/20/2021   Procedure: LAPAROSCOPIC INSERTION GASTROSTOMY TUBE;  Surgeon: Berna Bue, MD;  Location: [facility] OR;  Service: General;  Laterality: N/A;  DOW PATIENT   TRACHEOSTOMY TUBE PLACEMENT N/A 06/09/2021   Procedure: AWAKE TRACHEOSTOMY;  Surgeon: Christia Reading, MD;  Location: [facility] OR;  Service: ENT;  Laterality: N/A;      ALLERGIES:  has No Known Allergies.  MEDICATIONS:  Current Outpatient Medications  Medication Sig Dispense Refill   fluconazole (DIFLUCAN) 200 MG tablet Take 4 tablets (800 mg total) by mouth daily. 120 tablet 3   lidocaine (LIDODERM) 5 % Place 1 patch onto the skin daily. Remove & Discard patch within 12 hours or as directed by MD 30 patch 2   lidocaine (XYLOCAINE) 2 % solution Patient: Mix 1part 2% viscous lidocaine, 1part H20. Swish and spit 10mL of diluted mixture up to eight times a day, prn soreness 200 mL 5   methadone (DOLOPHINE) 10 MG/ML solution Take 6 mLs (60 mg total) by mouth daily. (Patient taking differently: Take 90 mg by mouth daily.) 90 mL 0   midodrine (PROAMATINE) 5 MG tablet Take 1 tablet (5 mg total) by mouth  3 (three) times daily with meals. 100 tablet 6   ondansetron (ZOFRAN) 8 MG tablet Take 1 tablet (8 mg total) by mouth every 8 (eight) hours as needed for nausea or vomiting. Starting day 3 after chemotherapy 30 tablet 2   Pregabalin 20 MG/ML SOLN Take 20 mg by mouth 3 (three) times daily. 60 mL 1    No current facility-administered medications for this visit.    VITAL SIGNS: There were no vitals taken for this visit. There were no vitals filed for this visit.     Estimated body mass index is 14.15 kg/m as calculated from the following:   Height as of 04/16/22: 5\' 10"  (1.778 m).   Weight as of 05/09/22: 98 lb 9.6 oz (44.7 kg).  PERFORMANCE STATUS (ECOG) : 1 - Symptomatic but completely ambulatory  Physical Exam General: NAD HEENT: Trach/Passy Muir valve in place Cardiovascular: regular rate and rhythm Pulmonary: normal breathing pattern  Abdomen: soft, tender, + bowel sounds Extremities: no edema, some head contracture to right side  Neurological: AAOx3, mood appropriate   IMPRESSION: David [name] presents to clinic today for follow-up. No acute distress. Looks well. Mother is present. States overall he has been doing ok. Denies nausea, vomiting, constipation, or diarrhea. Nutrition is appropriate he has gained a few pounds. Current weight 95lbs up from 93lbs. He is appreciative of this.   Pain is well controlled. Continues to be supported by [facility] for methadone. Tolerating Lyrica as prescribed. Shares this really helps with his discomfort. Taking as prescribed.   Ongoing support. Patient and mother knows to contact office as needed.   PLAN:  Emotional support. Lidocaine patch to back  Pregabalin 20 mg 3 times daily  Education provided on nutrition in the setting of poor dentition.  I will plan to see patient back in the clinic in 4-6 weeks in collaboration with his other oncology appointments.   Patient and mother expressed understanding and was in agreement with this plan. He also understands that He can call the clinic at any time with any questions, concerns, or complaints.        Any controlled substances utilized were prescribed in the context of palliative care. PDMP has been reviewed.    Time Total: 20 min   Visit consisted of counseling and  education dealing with the complex and emotionally intense issues of symptom management and palliative care in the setting of serious and potentially life-threatening illness.Greater than 50%  of this time was spent counseling and coordinating care related to the above assessment and plan.  Willette Alma, AGPCNP-BC  Palliative Medicine Team/[facility]

## 2022-06-12 NOTE — Progress Notes (Signed)
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 10/26 heme/onc f/u: ET/CT which was suggestive of possible residual disease but exploration and biopsy showed no evidence of malignancy which is very reassuring. Plan was to see onc in a year 11/9 out-pt SLP recommended he continue asp precautions and exercises  11/14 seen by ENT Procedure: Flexible fiberoptic laryngoscopy  "The right pyriform is effaced but no visible tumor seen. The epiglottis is edematous, as is the right aryepiglottic fold. I cannot see any visible ulcers or tumor of the endolarynx although visualization was difficult due to edema. Vocal fold mobility was not seen on the right. Airway very narrow due to the right arytenoid/AE fold edema" 2/13 seen by ENT. Ordered CT but did not see evidence of active disease  2/21 seen trach clinic. Holding off on moving towards decannulation as pt still had evidence of air trapping w/ trach occlusion.  4/18 presents for planned trach change. No sig new events since last visit. Feels like secretions better. Does report decreased neck ROM 5/2 seeing PT starting dry needling and weekly PT  Review of Systems  All other systems reviewed and are negative. Except he still neck stiffness, also has thick secretions primarily in the morning he has routine which he uses which I do not endorse however he has been doing for some time now where he actually removes his inner cannula to help expectorate his mucous\  Exam  General malnourished 47 year old male patient presents today for planned tracheostomy change HEENT temporal wasting noted, size 4 cuffless tracheostomy in place.  There is no significant breakdown at the tracheostomy stoma.  He is able to phonate with PMV in place his voice is raspy and coarse his cough is weak Pulmonary: Clear to auscultation Cardiac: Regular rate and rhythm Abdomen: Soft nontender Extremities: Warm dry brisk cap  refill   Procedure: #4 tracheostomy was removed, new tracheostomy was placed over obturator patient tolerated this well however did initially request to be suctioned however got quite agitated during attempt for this.  Insisted on removing his tracheostomy to help cough, still really did not produce much in the way of mucus he was adamant that he leave so that he can "take care of this my way".  The tracheostomy was replaced once again over obturator and end-tidal CO2 was confirmed  Active Problems:   Tracheostomy status (HCC)   Laryngeal cancer (HCC)  Tracheostomy status (HCC) Overview: Brand: shiley Size: 4 cuffless  Last change in clinic:5/15, prior was  4/18,  Discussion [name] is unremarkable. Still has sig air trapping w/ capping valve   Plan ROV 4 weeks Cont PMV as tolerated I am doubtful we will get him decannulated      My time 20 min   Simonne Martinet ACNP-BC [facility] Pulmonary/Critical Care Pager # [phone] OR # [phone] if no answer

## 2022-06-12 NOTE — Progress Notes (Signed)
Tracheostomy Procedure Note  David [name] [id number] 1975/10/20  Pre Procedure Tracheostomy Information  Trach Brand: Shiley Size:  4.0  [id number] Style: Uncuffed Secured by: Velcro   Procedure: Trach change and Trach cleaning    Post Procedure Tracheostomy Information  Trach Brand: Shiley Size:  4.0 [id number] Style: Uncuffed Secured by: Velcro   Post Procedure Evaluation:  ETCO2 positive color change from yellow to purple : Yes.   Vital signs:VSS Patients current condition: stable Complications: No apparent complications Trach site exam: clean, dry Wound care done: 4 x 4 drain gauze  applied Patient did tolerate procedure well.   Education: none  Prescription needs: none    Additional needs:  New PMV given to patient at this visit

## 2022-06-13 ENCOUNTER — Encounter: Payer: Self-pay | Admitting: Physical Therapy

## 2022-06-13 ENCOUNTER — Ambulatory Visit: Payer: Medicaid Other | Admitting: Physical Therapy

## 2022-06-13 ENCOUNTER — Telehealth: Payer: Self-pay

## 2022-06-13 ENCOUNTER — Encounter: Payer: Self-pay | Admitting: Radiation Oncology

## 2022-06-13 DIAGNOSIS — R293 Abnormal posture: Secondary | ICD-10-CM

## 2022-06-13 DIAGNOSIS — M542 Cervicalgia: Secondary | ICD-10-CM

## 2022-06-13 NOTE — Therapy (Signed)
OUTPATIENT PHYSICAL THERAPY CERVICAL TREATMENT   Patient Name: David [name] MRN: [id number] DOB:1975-02-01, 47 y.o., male Today's Date: 06/13/2022  END OF SESSION:  PT End of Session - 06/13/22 1105     Visit Number 2    Date for PT Re-Evaluation 07/26/22    Authorization Type Medicaid CCME- auth requested    PT Start Time 1103    PT Stop Time 1136    PT Time Calculation (min) 33 min    Activity Tolerance Patient tolerated treatment well    Behavior During Therapy WFL for tasks assessed/performed              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)    Pneumococcal bacteremia 06/09/2021   Pneumothorax    Left lung spontaneous pneumothorax at age 81 yr    Past Surgical History:  Procedure Laterality Date   chest tubes Left    IR GASTROSTOMY TUBE MOD SED  06/18/2021   LAPAROSCOPIC INSERTION GASTROSTOMY TUBE N/A 06/20/2021   Procedure: LAPAROSCOPIC INSERTION GASTROSTOMY TUBE;  Surgeon: Berna Bue, MD;  Location: [facility] OR;  Service: General;  Laterality: N/A;  DOW PATIENT   TRACHEOSTOMY TUBE PLACEMENT N/A 06/09/2021   Procedure: AWAKE TRACHEOSTOMY;  Surgeon: Christia Reading, MD;  Location: [facility] OR;  Service: ENT;  Laterality: N/A;   Patient Active Problem List   Diagnosis Date Noted   Evaluation by medical service required 03/19/2022   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   Defective dental restoration 12/31/2021   Chronic periodontitis 12/31/2021   Impacted third molar tooth 12/31/2021   Irritant contact dermatitis associated with digestive stoma  12/25/2021   Irritation around percutaneous endoscopic gastrostomy (PEG) tube site (HCC) 12/25/2021   Leaking PEG tube (HCC) 12/12/2021   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   Continuous tobacco abuse 11/21/2021   Dermatitis associated with moisture 10/16/2021   Back pain 08/16/2021   Candidal endocarditis    History of radiation to head and neck region 07/11/2021   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   Dysgeusia 07/11/2021   S/P percutaneous endoscopic gastrostomy (PEG) tube placement (HCC) 06/24/2021   Pressure injury of skin 06/17/2021   Tracheostomy status (HCC)    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   Heart failure with reduced ejection fraction (HCC) 04/28/2021   Elevated troponin 04/01/2021   Elevated transaminase level 04/01/2021   Weight loss, unintentional 01/15/2021   Protein-calorie malnutrition, severe (HCC) 01/10/2021   Hypotension 01/08/2021   AKI (acute kidney injury) (HCC) 01/08/2021   Chemotherapy induced nausea and vomiting 01/01/2021   Encounter for dental examination 12/13/2020   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   Teeth missing 12/04/2020   Radiation caries 12/04/2020   Retained tooth root 12/04/2020   Chronic apical periodontitis 12/04/2020   Accretions on teeth 12/04/2020    PCP: Adron Bene, MD   REFERRING PROVIDER: Glee Arvin, NP   REFERRING DIAG: Diagnosis Information  Diagnosis  C32.1 (ICD-10-CM) - Malignant neoplasm of supraglottis (HCC)  Z51.5 (ICD-10-CM) - Palliative care patient  Eval and treat for neck pain   THERAPY DIAG:  Cervicalgia  Abnormal posture  Rationale for Evaluation  and Treatment: Rehabilitation  ONSET DATE: 1 year history (trach placement)  SUBJECTIVE:                                                                                                                                                                                                         SUBJECTIVE STATEMENT: I have muscle aches. Hand  dominance: Right  PERTINENT HISTORY:  Tracheostomy, radiation to neck, malignant neoplasm of supraglottis  PAIN:  Are you having pain? Yes: NPRS scale: 5/10 Pain location: Lt>Rt neck and thoracic spine  Pain description: stiff, throbbing Aggravating factors: overstretching Relieving factors: not moving   PRECAUTIONS: Other: cancer, trach  WEIGHT BEARING RESTRICTIONS: No  FALLS:  Has patient fallen in last 6 months? No  LIVING ENVIRONMENT: Lives with: lives alone Lives in: House/apartment   PLOF: Independent, walking to mailbox  PATIENT GOALS: reduce neck pain, improve mobility   NEXT MD VISIT:   OBJECTIVE:   DIAGNOSTIC FINDINGS:  None   PATIENT SURVEYS:  NDI 11/45  COGNITION: Overall cognitive status: Within functional limits for tasks assessed  SENSATION: WFL  POSTURE: rounded shoulders, forward head, increased thoracic kyphosis, and flexed trunk   PALPATION: Significant tension and reduced muscle length in bil neck. Pain on Rt vs Lt.    CERVICAL ROM:   Active ROM A/PROM (deg) eval  Flexion Rests in flexion  Extension   Right lateral flexion Rests in 30 degrees  Left lateral flexion 10 degrees Rt lateral flexion  Right rotation WFLs  Left rotation WFLs   (Blank rows = not tested)  UPPER EXTREMITY ROM:  UE A/ROM is WFLs without pain  UPPER EXTREMITY MMT: 4+/5 bil UE strength, neck strength 4-/5 throughout   TODAY'S TREATMENT:                                                                                                                              DATE:   06/13/22: Upright posture in chair 5x10" STM bil upper traps from chair Trigger Point Dry-Needling  Treatment  instructions: Expect mild to moderate muscle soreness. S/S of pneumothorax if dry needled over a lung field, and to seek immediate medical attention should they occur. Patient verbalized understanding of these instructions and education.  Patient Consent Given: Yes Education  handout provided: Previously provided Muscles treated: bil upper traps in sitting Electrical stimulation performed: No Parameters: N/A Treatment response/outcome: tight Rt>Lt, deep ache Upright posture in chair Rt/Lt SB isometrics 2 rounds 5x5" Upright posture in chair yellow tband bicep curl, row, horiz abd, ER x8 each with brief rest between HEP focus needs to be upright posture multiple reps every waking hour, work on ROM in recliner  05/30/22 HEP initiated- see below     If treatment provided at initial evaluation, no treatment charged due to lack of authorization.        PATIENT EDUCATION:  Education details: Access Code: [email or web address] Person educated: Patient Education method: Explanation, Demonstration, and Handouts Education comprehension: verbalized understanding and returned demonstration  HOME EXERCISE PROGRAM: Access Code: [email or web address] URL: [email or web address] Date: 05/30/2022 Prepared by: Tresa Endo  Exercises - Seated Scapular Retraction  - 5 x daily - 7 x weekly - 1 sets - 10 reps - 5 hold - Supine Scapular Retraction  - 3 x daily - 7 x weekly - 1 sets - 10 reps - 5 hold - Seated Correct Posture  - 1 x daily - 7 x weekly - 3 sets - 10 reps - Seated Cervical Retraction  - 1 x daily - 7 x weekly - 3 sets - 10 reps - Supine Chin Tuck  - 1 x daily - 7 x weekly - 3 sets - 10 reps - Seated Cervical Sidebending AROM  - 3 x daily - 7 x weekly - 1 sets - 10 reps - 5-10 hold  ASSESSMENT:  CLINICAL IMPRESSION: Pt presents with his mom.  He has been unable to do HEP due to lack of energy, motivation and pain presence.  Pt is unable to lay down so all treatment from chair today.  Posture retraining focus to work toward upright posture with neutral head position, layering on isometrics and yellow tband scapular strength.  PT encouraged Pt to practice upright head correction at least 3 rounds each waking hour and to practice ROM from supported recliner position.  He admitted he  likely wouldn't do HEP if more therex given.  OBJECTIVE IMPAIRMENTS: decreased activity tolerance, increased muscle spasms, impaired flexibility, postural dysfunction, and pain.   ACTIVITY LIMITATIONS: sitting, standing, and reach over head  PARTICIPATION LIMITATIONS: meal prep, driving, and community activity  PERSONAL FACTORS: 1-2 comorbidities: cancer on pallative care, chronic postural dysfunction  are also affecting patient's functional outcome.   REHAB POTENTIAL: Good  CLINICAL DECISION MAKING: Stable/uncomplicated  EVALUATION COMPLEXITY: Moderate   GOALS: Goals reviewed with patient? Yes  SHORT TERM GOALS: Target date: 06/27/2022    Be independent in initial  HEP Baseline:  Goal status: INITIAL  2.  Verbalize and demonstrate postural corrections to improve tolerance for neutral posture and reduced neck strain  Baseline:  Goal status: ongoing   LONG TERM GOALS: Target date: 07/26/22  Be independent in advanced HEP Baseline:  Goal status: INITIAL  3.  Improve NDI to < or = to 7/45 Baseline: 11/45 Goal status: INITIAL  3.   Perform independent postural correction and hold this x 1-2 minutes  Baseline: needs visual feedback and can hold <20 seconds  Goal status: INITIAL   4.   Report > or = to 40% reduction in neck pain  due to reduced tension and strain in neck Baseline: 3-5/10 Goal status: INITIAL    PLAN:  PT FREQUENCY: 1x/week  PT DURATION: 8 weeks  PLANNED INTERVENTIONS: Therapeutic exercises, Therapeutic activity, Neuromuscular re-education, Balance training, Gait training, Patient/Family education, Self Care, Joint mobilization, Aquatic Therapy, Dry Needling, Electrical stimulation, Spinal mobilization, Cryotherapy, Moist heat, Taping, Traction, Manual therapy, and Re-evaluation  PLAN FOR NEXT SESSION: work on postural reeducation, DN to neck and upper traps and provide with DN info  Morton Peters, PT 06/13/22 11:44 AM     [facility]  Specialty Rehab Services [address], [address] [place], [place] [address] Phone # [phone] Fax [phone]

## 2022-06-13 NOTE — Telephone Encounter (Signed)
RN called pt to obtain follow up information over the phone for Dr. Basilio Cairo. Rn spoke with pt's mom (pt has trach and hard time speaking over the phone). There is a concern for the right side of his neck, he is seeing physical therapy for this. His peg tube fell out about two months ago, md is aware. He is eating well but his mom is still concerned with how fragile he looks. 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

## 2022-06-14 ENCOUNTER — Other Ambulatory Visit: Payer: Self-pay

## 2022-06-14 ENCOUNTER — Encounter: Payer: Self-pay | Admitting: Nurse Practitioner

## 2022-06-14 ENCOUNTER — Encounter: Payer: Self-pay | Admitting: Radiation Oncology

## 2022-06-14 ENCOUNTER — Ambulatory Visit
Admission: RE | Admit: 2022-06-14 | Discharge: 2022-06-14 | Disposition: A | Discharge status: Home / Self Care | Payer: Medicaid Other | Source: Ambulatory Visit | Attending: Radiation Oncology | Admitting: Radiation Oncology

## 2022-06-14 ENCOUNTER — Inpatient Hospital Stay: Payer: Medicaid Other | Attending: Radiation Oncology | Admitting: Nurse Practitioner

## 2022-06-14 VITALS — BP 100/90 | HR 84 | Temp 97.5°F | Resp 18 | Wt 95.0 lb

## 2022-06-14 VITALS — BP 110/85 | HR 83 | Temp 98.6°F | Resp 15 | Ht 70.0 in | Wt 95.3 lb

## 2022-06-14 DIAGNOSIS — M792 Neuralgia and neuritis, unspecified: Secondary | ICD-10-CM | POA: Diagnosis not present

## 2022-06-14 DIAGNOSIS — C329 Malignant neoplasm of larynx, unspecified: Secondary | ICD-10-CM

## 2022-06-14 DIAGNOSIS — Z515 Encounter for palliative care: Secondary | ICD-10-CM | POA: Diagnosis not present

## 2022-06-14 DIAGNOSIS — C321 Malignant neoplasm of supraglottis: Secondary | ICD-10-CM

## 2022-06-14 DIAGNOSIS — G893 Neoplasm related pain (acute) (chronic): Secondary | ICD-10-CM

## 2022-06-14 MED ORDER — OXYMETAZOLINE HCL 0.05 % NA SOLN
1.0000 | Freq: Once | NASAL | Status: AC
  Administered 2022-06-14: 1 via NASAL
  Filled 2022-06-14: qty 30

## 2022-06-14 MED ORDER — PREGABALIN 20 MG/ML PO SOLN: 20.0000 mg | Freq: Three times a day (TID) | ORAL | 1 refills | Status: DC

## 2022-06-14 NOTE — Progress Notes (Signed)
Free T4 and TSH ordered per Dr. Colletta Maryland request

## 2022-06-14 NOTE — Progress Notes (Signed)
Radiation Oncology         [phone] ________________________________  Name: David [name] MRN: [id number]  Date: 06/14/2022  DOB: 1975-05-24  Follow-Up Visit Note  CC: Adron Bene, MD (Inactive)  [name], [name], DO  Diagnosis and Prior Radiotherapy:   C32.1     ICD-10-CM   1. 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 Basilio Cairo.Completed radiation to the larynx on 02-09-2021.     Pain issues, if any: Pain in the back of his neck, his head hangs to the right  Using a feeding tube?: no feeding tube, fell out a couple of months ago, mds are aware of this, eating better now (likes sweets)  Weight change:  Wt Readings from Last 3 Encounters:  06/14/22 95 lb 4.8 oz (43.2 kg)  06/14/22 95 lb (43.1 kg)  05/09/22 98 lb 9.6 oz (44.7 kg)   Weight changes, if any: mom has concerns about this, he does not weigh himself often at times, his weight fluctuates  Swallowing issues, if any: coughs a lot, Dr. Ezekiel Ina is recommending physical therapy for neck, swallowing has improved. He was recently released from SLP.  Smoking or chewing tobacco? Still smokes, not as much as before Using fluoride toothpaste daily? no Last ENT visit was on: 03-12-22 with Dr. Hezzie Bump Other notable issues, if any: Lymphedema to right neck getting therapy for this.  Pt has trach. 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 Mom states that the doctors state the infection could kill him.   03-12-22 with Dr. Hezzie Bump Procedure:  Flexible fiberoptic laryngoscopy Technique: After anesthetizing the nasal cavity with topical lidocaine and oxymetazoline, the flexible endoscope was introduced and passed through the nasal cavity into the nasopharynx. The scope was then advanced to the level of the oropharynx. The posterior soft palate, uvula, tongue base and vallecula were visualized and appeared healthy without mucosal masses or lesions. There are some secretions in the hypopharynx. The right pyriform is effaced but no visible tumor seen. The epiglottis is edematous, as is the right aryepiglottic fold. I cannot see any visible ulcers or tumor of the endolarynx although visualization was difficult due to edema. Vocal fold mobility was not seen on the right. Airway narrow due to the right arytenoid/AE fold edema, but improved since last visit.. The scope was withdrawn from the nose. He tolerated the procedure well.      ALLERGIES:  has No Known Allergies.  Meds: Current Outpatient Medications  Medication Sig Dispense Refill   fluconazole (DIFLUCAN) 200 MG tablet Take 4 tablets (800 mg total) by mouth daily. 120 tablet 3   lidocaine (LIDODERM) 5 % Place 1 patch onto the skin daily. Remove & Discard patch within 12 hours or as directed by MD 30 patch 2   lidocaine (XYLOCAINE) 2 % solution Patient: Mix 1part 2% viscous lidocaine, 1part H20. Swish and spit 10mL of diluted mixture up to eight times a day, prn soreness 200 mL 5   methadone (DOLOPHINE) 10 MG/ML solution Take  6 mLs (60 mg total) by mouth daily. (Patient taking differently: Take 90 mg by mouth daily.) 90 mL 0   midodrine (PROAMATINE) 5 MG tablet Take 1 tablet (5 mg total) by mouth 3 (three) times daily with meals. 100 tablet 6   ondansetron (ZOFRAN) 8 MG tablet Take 1 tablet (8 mg total) by mouth every 8 (eight) hours as needed for nausea or vomiting. Starting day 3 after chemotherapy 30 tablet 2   Pregabalin 20 MG/ML SOLN Take 20 mg by mouth 3 (three) times daily. 60 mL 1    No current facility-administered medications for this encounter.    Physical Findings: The patient is in no acute distress. Patient is alert and oriented. Wt Readings from Last 3 Encounters:  06/14/22 95 lb 4.8 oz (43.2 kg)  06/14/22 95 lb (43.1 kg)  05/09/22 98 lb 9.6 oz (44.7 kg)    weight is 95 lb (43.1 kg). His temperature is 97.5 F (36.4 C) (abnormal). His blood pressure is 100/90 (abnormal) and his pulse is 84. His respiration is 18 and oxygen saturation is 100%. .  General: Alert and oriented  HEENT: Head is normocephalic. Extraocular movements are intact. Oropharynx is notable for no tumor in upper throat.  Trach present.  Neck: Neck is notable for mass in the right level 2 region.  Neck strength is poor, and head is tilted to right.  No sign of stomal recurrence. MSK: Muscle wasting, ambulatory  PROCEDURE NOTE: After obtaining consent and spraying nasal cavity with topical oxymetazoline, the flexible endoscope was  coated w/ lidocaine gel and introduced and passed through the nasal cavity.  The nasopharynx, oropharynx, hypopharynx, and larynx  were then examined. No mucosal lesions appreciated but the supraglottis is very edematous and the true cords were not visible.    ECOG = 3  0 - Asymptomatic (Fully active, able to carry on all predisease activities without restriction)  1 - Symptomatic but completely ambulatory (Restricted in physically strenuous activity but ambulatory and able to carry out work of a light or sedentary nature. For example, light housework, office work)  2 - Symptomatic, <50% in bed during the day (Ambulatory and capable of all self care but unable to carry out any work activities. Up and about more than 50% of waking hours)  3 - Symptomatic, >50% in bed, but not bedbound (Capable of only limited self-care, confined to bed or chair 50% or more of waking hours)  4 - Bedbound (Completely disabled. Cannot carry on any self-care. Totally confined to bed  or chair)  5 - Death   David [name], Creech RH, Tormey DC, et al. [phone]). "Toxicity and response criteria of the [facility]". Am. Evlyn Clines. Oncol. 5 (6): 649-55  Lab Findings: Lab Results  Component Value Date   WBC 4.7 02/06/2022   HGB 11.5 (L) 02/06/2022   HCT 34.6 (L) 02/06/2022   MCV 91.5 02/06/2022   PLT 169 02/06/2022    Lab Results  Component Value Date   TSH 5.244 (H) 11/22/2021        Latest Ref Rng & Units 02/06/2022    2:53 PM 11/07/2021    3:59 PM 09/26/2021   12:13 PM  BMP  Glucose 65 - 99 mg/dL 191  478  295   BUN 7 - 25 mg/dL 17  12  13    Creatinine 0.60 - 1.29 mg/dL 6.21  3.08  6.57   BUN/Creat Ratio 6 - 22 (calc) SEE NOTE:  SEE NOTE:  SEE NOTE:   Sodium 135 - 146 mmol/L 139  139  135   Potassium 3.5 - 5.3 mmol/L 4.4  3.7  3.8   Chloride 98 - 110 mmol/L 103  105  100   CO2 20 - 32 mmol/L 24  28  25    Calcium 8.6 - 10.3 mg/dL 9.1  8.9  9.3     Radiographic Findings: No results found.  Impression/Plan:    1) Head and Neck Cancer Status:  Following closely now with ENT at [facility]. Post treatment biopsy negative.    He is very debilitated and has multiple comorbidities and will continue to follow closely with palliative care.  He sees palliative care today.  2) Nutritional Status:  Wt Readings from Last 3 Encounters:  06/14/22 95 lb 4.8 oz (43.2 kg)  06/14/22 95 lb (43.1 kg)  05/09/22 98 lb 9.6 oz (44.7 kg)   Patient is on oral diet - PEG pulled out.  3) Risk Factors: The patient has been educated about risk factors including alcohol and tobacco abuse; they understand that avoidance of alcohol and tobacco is important to prevent recurrences as well as other cancers  He continues to smoke cigarettes and does not appear motivated to quit; he also has a history of illicit drug use  4) Swallowing:  Pt stopped SLP on his own accord.  Verdie Mosher did see him recently and states he will see patient again if patient is willing.  5)   Thyroid function:  normal Free T4 in October - check labs at next appt Lab Results  Component Value Date   TSH 5.244 (H) 11/22/2021    6) he will continue physical therapy for lymphedema and issues with poor neck strength  7) He will follow-up with me in 6 months  - sooner if needed.    On date of service, in total, I spent 30 minutes on this encounter. Patient was seen in person. _____________________________________   Lonie Peak, MD

## 2022-06-14 NOTE — Progress Notes (Signed)
Oncology Nurse Navigator Documentation    I met with David [name] and his mom before they saw Dr. Basilio Cairo today for his scheduled follow up. He is attending rehab and following with the trach clinic regularly. I reminded them that they could call me at any time as needed.  Hedda Slade RN, BSN, OCN Head & Neck Oncology Nurse Navigator [facility] Phone # [phone]  Fax # [phone]

## 2022-06-19 ENCOUNTER — Ambulatory Visit: Payer: Medicaid Other | Admitting: Physical Therapy

## 2022-06-19 ENCOUNTER — Encounter: Payer: Self-pay | Admitting: Physical Therapy

## 2022-06-19 DIAGNOSIS — M542 Cervicalgia: Secondary | ICD-10-CM

## 2022-06-19 DIAGNOSIS — R293 Abnormal posture: Secondary | ICD-10-CM

## 2022-06-19 NOTE — Therapy (Signed)
OUTPATIENT PHYSICAL THERAPY CERVICAL TREATMENT   Patient Name: David [name] MRN: [id number] DOB:November 23, 1975, 47 y.o., male Today's Date: 06/19/2022  END OF SESSION:  PT End of Session - 06/19/22 1153     Visit Number 3    Number of Visits 4    Date for PT Re-Evaluation 07/26/22    Authorization Type Medicaid CCME- auth requested    Authorization - Visit Number 2    Authorization - Number of Visits 3    PT Start Time 1153   Pt late   PT Stop Time 1230    PT Time Calculation (min) 37 min    Activity Tolerance Patient limited by pain;Patient limited by fatigue    Behavior During Therapy [facility] for tasks assessed/performed               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)    Pneumococcal bacteremia 06/09/2021   Pneumothorax    Left lung spontaneous pneumothorax at age 89 yr    Past Surgical History:  Procedure Laterality Date   chest tubes Left    IR GASTROSTOMY TUBE MOD SED  06/18/2021   LAPAROSCOPIC INSERTION GASTROSTOMY TUBE N/A 06/20/2021   Procedure: LAPAROSCOPIC INSERTION GASTROSTOMY TUBE;  Surgeon: Berna Bue, MD;  Location: [facility] OR;  Service: General;  Laterality: N/A;  DOW PATIENT   TRACHEOSTOMY TUBE PLACEMENT N/A 06/09/2021   Procedure: AWAKE TRACHEOSTOMY;  Surgeon: Christia Reading, MD;  Location: [facility] OR;  Service: ENT;  Laterality: N/A;   Patient Active Problem List   Diagnosis Date Noted   Evaluation by medical service required 03/19/2022   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   Defective dental restoration 12/31/2021   Chronic  periodontitis 12/31/2021   Impacted third molar tooth 12/31/2021   Irritant contact dermatitis associated with digestive stoma 12/25/2021   Irritation around percutaneous endoscopic gastrostomy (PEG) tube site (HCC) 12/25/2021   Leaking PEG tube (HCC) 12/12/2021   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   Continuous tobacco abuse 11/21/2021   Dermatitis associated with moisture 10/16/2021   Back pain 08/16/2021   Candidal endocarditis    History of radiation to head and neck region 07/11/2021   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   Dysgeusia 07/11/2021   S/P percutaneous endoscopic gastrostomy (PEG) tube placement (HCC) 06/24/2021   Pressure injury of skin 06/17/2021   Tracheostomy status (HCC)    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   Heart failure with reduced ejection fraction (HCC) 04/28/2021   Elevated troponin 04/01/2021   Elevated transaminase level 04/01/2021   Weight loss, unintentional 01/15/2021   Protein-calorie malnutrition, severe (HCC) 01/10/2021   Hypotension 01/08/2021   AKI (acute kidney injury) (HCC) 01/08/2021   Chemotherapy induced nausea and vomiting 01/01/2021   Encounter for dental examination 12/13/2020   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   Teeth missing 12/04/2020   Radiation caries 12/04/2020   Retained tooth root 12/04/2020   Chronic apical periodontitis 12/04/2020   Accretions on teeth 12/04/2020    PCP: Adron Bene, MD   REFERRING PROVIDER: Glee Arvin, NP   REFERRING DIAG: Diagnosis Information  Diagnosis  C32.1 (ICD-10-CM) - Malignant  neoplasm of supraglottis (HCC)  Z51.5 (ICD-10-CM) - Palliative care patient  Eval and treat for neck pain   THERAPY DIAG:  Cervicalgia  Abnormal posture  Rationale for Evaluation and Treatment: Rehabilitation  ONSET DATE: 1 year history (trach placement)  SUBJECTIVE:                                                                                                                                                                                                          SUBJECTIVE STATEMENT: Maybe a little bit of progress after last time.  I got sore and then it reduced.  I am still so tight Rt>Lt. I am working on the posture HEP.  I have been doing it 1-2x/day.  I walk to mailbox and back 2 blocks most days of the week.  Hand dominance: Right  PERTINENT HISTORY:  Tracheostomy, radiation to neck, malignant neoplasm of supraglottis  PAIN:  Are you having pain? Yes: NPRS scale: 5/10 Pain location: Lt>Rt neck and thoracic spine  Pain description: stiff, throbbing Aggravating factors: overstretching Relieving factors: not moving   PRECAUTIONS: Other: cancer, trach  WEIGHT BEARING RESTRICTIONS: No  FALLS:  Has patient fallen in last 6 months? No  LIVING ENVIRONMENT: Lives with: lives alone Lives in: House/apartment   PLOF: Independent, walking to mailbox  PATIENT GOALS: reduce neck pain, improve mobility   NEXT MD VISIT:   OBJECTIVE:   DIAGNOSTIC FINDINGS:  None   PATIENT SURVEYS:  NDI 11/45  COGNITION: Overall cognitive status: Within functional limits for tasks assessed  SENSATION: WFL  POSTURE: rounded shoulders, forward head, increased thoracic kyphosis, and flexed trunk   PALPATION: Significant tension and reduced muscle length in bil neck. Pain on Rt vs Lt.    CERVICAL ROM:   Active ROM A/PROM (deg) eval  Flexion Rests in flexion  Extension   Right lateral flexion Rests in 30 degrees  Left lateral flexion 10 degrees Rt lateral flexion  Right rotation WFLs  Left rotation WFLs   (Blank rows = not tested)  UPPER EXTREMITY ROM:  UE A/ROM is WFLs without pain  UPPER EXTREMITY MMT: 4+/5 bil UE strength, neck strength 4-/5 throughout   TODAY'S TREATMENT:  DATE:  06/19/22: Postural correction to lengthen Rt SCM  3x30" Pt to add Rt UE support to Rt side of head to give supported stretch to Rt cervical muscles Cervical SNAG using towel into Lt rotation (HEP) Postural cueing for how to right head and level ears throughout session as tol  06/13/22: Upright posture in chair 5x10" STM bil upper traps from chair Trigger Point Dry-Needling  Treatment instructions: Expect mild to moderate muscle soreness. S/S of pneumothorax if dry needled over a lung field, and to seek immediate medical attention should they occur. Patient verbalized understanding of these instructions and education.  Patient Consent Given: Yes Education handout provided: Previously provided Muscles treated: bil upper traps in sitting Electrical stimulation performed: No Parameters: N/A Treatment response/outcome: tight Rt>Lt, deep ache Upright posture in chair Rt/Lt SB isometrics 2 rounds 5x5" Upright posture in chair yellow tband bicep curl, row, horiz abd, ER x8 each with brief rest between HEP focus needs to be upright posture multiple reps every waking hour, work on ROM in recliner  05/30/22 HEP initiated- see below     If treatment provided at initial evaluation, no treatment charged due to lack of authorization.        PATIENT EDUCATION:  Education details: Access Code: [email or web address] Person educated: Patient Education method: Explanation, Demonstration, and Handouts Education comprehension: verbalized understanding and returned demonstration  HOME EXERCISE PROGRAM: Access Code: [email or web address] URL: [email or web address] Date: 06/19/2022 Prepared by: Loistine Simas Ezzard Ditmer  Exercises - Seated Scapular Retraction  - 5 x daily - 7 x weekly - 1 sets - 10 reps - 5 hold - Supine Scapular Retraction  - 3 x daily - 7 x weekly - 1 sets - 10 reps - 5 hold - Seated Correct Posture  - 1 x daily - 7 x weekly - 3 sets - 10 reps - Seated Cervical Retraction  - 1 x daily - 7 x weekly - 3 sets - 10 reps - Supine Chin Tuck  - 1 x daily - 7  x weekly - 3 sets - 10 reps - Seated Cervical Sidebending AROM  - 3 x daily - 7 x weekly - 1 sets - 10 reps - 5-10 hold - Seated Assisted Cervical Rotation with Towel  - 5 x daily - 7 x weekly - 1 sets - 3 reps - 10 hold  ASSESSMENT:  CLINICAL IMPRESSION: Pt presents with his mom.  He is unsure whether the DN helped him last time.  He continues to be very tight in Rt SCM which pulls his resting posture into flexion, Rt SB and Lt Rot.  He demo'd improving ability to right his head and neck over shoulders today to nearly neutral.  He was able to hold this position x 30 sec today demo'ing improving postural endurance.  PT added cervical SNAG with towel to provide supported positioning for stretching to HEP.  PT encouraging Pt to perform HEP up to 5x daily given how much tension he needs to work on overcoming and how he needs to build up postural endurance as flexibility and range improve.  OBJECTIVE IMPAIRMENTS: decreased activity tolerance, increased muscle spasms, impaired flexibility, postural dysfunction, and pain.   ACTIVITY LIMITATIONS: sitting, standing, and reach over head  PARTICIPATION LIMITATIONS: meal prep, driving, and community activity  PERSONAL FACTORS: 1-2 comorbidities: cancer on pallative care, chronic postural dysfunction  are also affecting patient's functional outcome.   REHAB POTENTIAL: Good  CLINICAL DECISION MAKING: Stable/uncomplicated  EVALUATION COMPLEXITY: Moderate   GOALS: Goals reviewed with patient?  Yes  SHORT TERM GOALS: Target date: 06/27/2022    Be independent in initial  HEP Baseline:  Goal status: ongoing  2.  Verbalize and demonstrate postural corrections to improve tolerance for neutral posture and reduced neck strain  Baseline:  Goal status: ongoing   LONG TERM GOALS: Target date: 07/26/22  Be independent in advanced HEP Baseline:  Goal status: INITIAL  3.  Improve NDI to < or = to 7/45 Baseline: 11/45 Goal status: INITIAL  3.    Perform independent postural correction and hold this x 1-2 minutes  Baseline: needs visual feedback and can hold <20 seconds  Goal status: can hold 30 sec 5/22   4.   Report > or = to 40% reduction in neck pain due to reduced tension and strain in neck Baseline: 3-5/10 Goal status: INITIAL    PLAN:  PT FREQUENCY: 1x/week  PT DURATION: 8 weeks  PLANNED INTERVENTIONS: Therapeutic exercises, Therapeutic activity, Neuromuscular re-education, Balance training, Gait training, Patient/Family education, Self Care, Joint mobilization, Aquatic Therapy, Dry Needling, Electrical stimulation, Spinal mobilization, Cryotherapy, Moist heat, Taping, Traction, Manual therapy, and Re-evaluation  PLAN FOR NEXT SESSION: f/u on postural correction (timed for goal), SNAG and supported neutral head positioning for SCM stretching on Rt, STM or DN to neck and upper traps as [employer], PT 06/19/22 1:31 PM      [facility] [address] [place], [place] [address] Phone # [phone] Fax [phone]

## 2022-06-27 ENCOUNTER — Ambulatory Visit: Payer: Medicaid Other | Admitting: Physical Therapy

## 2022-06-27 ENCOUNTER — Encounter: Payer: Self-pay | Admitting: Physical Therapy

## 2022-06-27 DIAGNOSIS — M542 Cervicalgia: Secondary | ICD-10-CM | POA: Diagnosis not present

## 2022-06-27 DIAGNOSIS — R293 Abnormal posture: Secondary | ICD-10-CM

## 2022-06-27 NOTE — Therapy (Signed)
OUTPATIENT PHYSICAL THERAPY CERVICAL TREATMENT   Patient Name: David [name] MRN: [id number] DOB:07-Dec-1975, 47 y.o., male Today's Date: 06/27/2022  END OF SESSION:  PT End of Session - 06/27/22 1058     Visit Number 4    Number of Visits 4    Date for PT Re-Evaluation 07/26/22    Authorization Type Medicaid CCME- auth requested for 1x/week for another 6 visits    Authorization - Visit Number 3    Authorization - Number of Visits 3    PT Start Time 1100    PT Stop Time 1130    PT Time Calculation (min) 30 min    Activity Tolerance Patient limited by pain;Patient limited by fatigue    Behavior During Therapy [facility] for tasks assessed/performed                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)    Pneumococcal bacteremia 06/09/2021   Pneumothorax    Left lung spontaneous pneumothorax at age 33 yr    Past Surgical History:  Procedure Laterality Date   chest tubes Left    IR GASTROSTOMY TUBE MOD SED  06/18/2021   LAPAROSCOPIC INSERTION GASTROSTOMY TUBE N/A 06/20/2021   Procedure: LAPAROSCOPIC INSERTION GASTROSTOMY TUBE;  Surgeon: Berna Bue, MD;  Location: [facility] OR;  Service: General;  Laterality: N/A;  DOW PATIENT   TRACHEOSTOMY TUBE PLACEMENT N/A 06/09/2021   Procedure: AWAKE TRACHEOSTOMY;  Surgeon: Christia Reading, MD;  Location: [facility] OR;  Service: ENT;  Laterality: N/A;   Patient Active Problem List   Diagnosis Date Noted   Evaluation by medical service required 03/19/2022   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   Defective dental restoration  12/31/2021   Chronic periodontitis 12/31/2021   Impacted third molar tooth 12/31/2021   Irritant contact dermatitis associated with digestive stoma 12/25/2021   Irritation around percutaneous endoscopic gastrostomy (PEG) tube site (HCC) 12/25/2021   Leaking PEG tube (HCC) 12/12/2021   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   Continuous tobacco abuse 11/21/2021   Dermatitis associated with moisture 10/16/2021   Back pain 08/16/2021   Candidal endocarditis    History of radiation to head and neck region 07/11/2021   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   Dysgeusia 07/11/2021   S/P percutaneous endoscopic gastrostomy (PEG) tube placement (HCC) 06/24/2021   Pressure injury of skin 06/17/2021   Tracheostomy status (HCC)    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   Heart failure with reduced ejection fraction (HCC) 04/28/2021   Elevated troponin 04/01/2021   Elevated transaminase level 04/01/2021   Weight loss, unintentional 01/15/2021   Protein-calorie malnutrition, severe (HCC) 01/10/2021   Hypotension 01/08/2021   AKI (acute kidney injury) (HCC) 01/08/2021   Chemotherapy induced nausea and vomiting 01/01/2021   Encounter for dental examination 12/13/2020   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   Teeth missing 12/04/2020   Radiation caries 12/04/2020   Retained tooth root 12/04/2020   Chronic apical periodontitis 12/04/2020   Accretions on teeth 12/04/2020    PCP: Adron Bene, MD   REFERRING PROVIDER: Glee Arvin, NP   REFERRING DIAG: Diagnosis Information  Diagnosis  C32.1 (ICD-10-CM) - Malignant neoplasm of supraglottis (HCC)  Z51.5 (ICD-10-CM) - Palliative care patient  Eval and treat for neck pain   THERAPY DIAG:  Cervicalgia  Abnormal posture  Rationale for Evaluation and Treatment: Rehabilitation  ONSET DATE: 1 year history (trach placement)  SUBJECTIVE:                                                                                                                                                                                                          SUBJECTIVE STATEMENT: I feel like crap.  I had no sleep last night - it felt like something was in my trach but it wasn't that.  I tried and tried to clear it and get it up but now it feels like it's up in my nose - maybe swelling.   PT has been really useful.  It feels like I've been proactive vs feeling like I'm sitting in the apartment waiting to die.  I want to find something more to do now that I'm moving better - maybe build models or paint/draw.   Hand dominance: Right  PERTINENT HISTORY:  Tracheostomy, radiation to neck, malignant neoplasm of supraglottis  PAIN:  Are you having pain? Yes: NPRS scale: 3-5/10 Pain location: Lt>Rt neck and thoracic spine  Pain description: stiff, throbbing Aggravating factors: overstretching Relieving factors: not moving   PRECAUTIONS: Other: cancer, trach  WEIGHT BEARING RESTRICTIONS: No  FALLS:  Has patient fallen in last 6 months? No  LIVING ENVIRONMENT: Lives with: lives alone Lives in: House/apartment   PLOF: Independent, walking to mailbox  PATIENT GOALS: reduce neck pain, improve mobility   NEXT MD VISIT:   OBJECTIVE:   DIAGNOSTIC FINDINGS:  None   PATIENT SURVEYS:  5/30 NDI: 12/45 NDI 11/45  COGNITION: Overall cognitive status: Within functional limits for tasks assessed  SENSATION: WFL  POSTURE: rounded shoulders, forward head, increased thoracic kyphosis, and flexed trunk   PALPATION: 5/30:  Significant adaptive shortening of Rt SCM, lengthened enough now for Pt to be able to achieve neutral posture from resting in flexion/Rt SB/Lt Rot Over-lengthened and painful Lt cervical paraspinals and upper trap secondary to tone in Rt SCM Very tender SCM beneath jaw   Significant tension and reduced muscle length in bil neck. Pain on Rt vs Lt.    CERVICAL ROM:   Active ROM A/PROM (deg) eval A/ROM  Flexion Rests in  flexion Rests in  55 deg flexion, Rt SB, Lt Rot:  Able to  Right posture to 15 deg flexion  Extension  Rests in flexion, Rt  SB, Lt Rot: Able to correct to 15 deg flexion   Right lateral flexion Rests in 30 degrees    Left lateral flexion 10 degrees Rt lateral flexion    Right rotation WFLs From 15 deg flexion, 45  deg   Left rotation WFLs From 15 deg flexion, 45 deg    (Blank rows = not tested)  UPPER EXTREMITY ROM:  UE A/ROM is WFLs without pain  UPPER EXTREMITY MMT:  5/30 Neck strength: able to perform postural correction to near neutral and hold 36 sec x 1 reps  4+/5 bil UE strength, neck strength 4-/5 throughout   TODAY'S TREATMENT:                                                                                                                              DATE:  06/27/22: NDI, ROM, MMT, review of HEP Seated STM Rt scalenes, SCM, upper trap to tolerance  Seated P/ROM toward Lt SB, extension, Rt Rot to tolerance Discussed trying laying on Lt side to work on gravity assisted stretching of Rt cervical muscles  06/19/22: Postural correction to lengthen Rt SCM 3x30" Pt to add Rt UE support to Rt side of head to give supported stretch to Rt cervical muscles Cervical SNAG using towel into Lt rotation (HEP) Postural cueing for how to right head and level ears throughout session as tol  06/13/22: Upright posture in chair 5x10" STM bil upper traps from chair Trigger Point Dry-Needling  Treatment instructions: Expect mild to moderate muscle soreness. S/S of pneumothorax if dry needled over a lung field, and to seek immediate medical attention should they occur. Patient verbalized understanding of these instructions and education.  Patient Consent Given: Yes Education handout provided: Previously provided Muscles treated: bil upper traps in sitting Electrical stimulation performed: No Parameters: N/A Treatment response/outcome: tight Rt>Lt, deep ache Upright posture in chair  Rt/Lt SB isometrics 2 rounds 5x5" Upright posture in chair yellow tband bicep curl, row, horiz abd, ER x8 each with brief rest between HEP focus needs to be upright posture multiple reps every waking hour, work on ROM in recliner  05/30/22 HEP initiated- see below     If treatment provided at initial evaluation, no treatment charged due to lack of authorization.        PATIENT EDUCATION:  Education details: Access Code: [email or web address] Person educated: Patient Education method: Explanation, Demonstration, and Handouts Education comprehension: verbalized understanding and returned demonstration  HOME EXERCISE PROGRAM: Access Code: [email or web address] URL: [email or web address] Date: 06/19/2022 Prepared by: Loistine Simas Francely Craw  Exercises - Seated Scapular Retraction  - 5 x daily - 7 x weekly - 1 sets - 10 reps - 5 hold - Supine Scapular Retraction  - 3 x daily - 7 x weekly - 1 sets - 10 reps - 5 hold - Seated Correct Posture  - 1 x daily - 7 x weekly - 3 sets - 10 reps - Seated Cervical Retraction  - 1 x daily -  7 x weekly - 3 sets - 10 reps - Supine Chin Tuck  - 1 x daily - 7 x weekly - 3 sets - 10 reps - Seated Cervical Sidebending AROM  - 3 x daily - 7 x weekly - 1 sets - 10 reps - 5-10 hold - Seated Assisted Cervical Rotation with Towel  - 5 x daily - 7 x weekly - 1 sets - 3 reps - 10 hold  ASSESSMENT:  CLINICAL IMPRESSION: Pt presents with his mom.  He had a very bad night last night getting no rest secondary to feeling increased sensation of swelling around trachea.  He hadn't been able to eat and felt nauseated on arrival due to high water intake without food.   Pt does state he feels like PT is helping with his mobility.  He has been working on postural righting endurance and SNAG passive stretches.  He continues to rest in flexion/Rt SB/Lt Rot from adaptive shortening of SCM and scalenes on Rt.  He has consistent constant pain 3-5/10 which has not changed but he is now able to right  posture coming closer to neutral (can correct posture from resting in 55 deg flexion to 15 deg flexion) and demos improved endurance holding postural righting x 36 sec today before fatigue.  He is using SNAG technique with towel to work on Rt cervical stretching and has yellow tband for postural strength. Pt is motivated to continue with PT and will benefit from continued skilled PT for manual techniques to complement his HEP to improve muscle tightness, ROM and strength to improve QOL.  He stated he feels like he has more purpose by way of his HEP and even expressed interest in finding a hobby to do with his hands now that he is moving better such as building models or painting/drawing.   OBJECTIVE IMPAIRMENTS: decreased activity tolerance, increased muscle spasms, impaired flexibility, postural dysfunction, and pain.   ACTIVITY LIMITATIONS: sitting, standing, and reach over head  PARTICIPATION LIMITATIONS: meal prep, driving, and community activity  PERSONAL FACTORS: 1-2 comorbidities: cancer on pallative care, chronic postural dysfunction  are also affecting patient's functional outcome.   REHAB POTENTIAL: Good  CLINICAL DECISION MAKING: Stable/uncomplicated  EVALUATION COMPLEXITY: Moderate   GOALS: Goals reviewed with patient? Yes  SHORT TERM GOALS: Target date: 06/27/2022    Be independent in initial  HEP Baseline:  Goal status: met 5/30  2.  Verbalize and demonstrate postural corrections to improve tolerance for neutral posture and reduced neck strain  Baseline:  Goal status: met 5/30   LONG TERM GOALS: Target date: 07/26/22  Be independent in advanced HEP Baseline:  Goal status: ongoing  3.  Improve NDI to < or = to 7/45 Baseline: 11/45 Goal status: 12/45  3.   Perform independent postural correction and hold this x 1-2 minutes  Baseline: needs visual feedback and can hold <20 seconds  Goal status: can hold 36 sec 5/30   4.   Report > or = to 40% reduction in neck  pain due to reduced tension and strain in neck Baseline: 3-5/10 Goal status: ongoing 5/30, no change in pain but Pt feels improved mobility    PLAN:  PT FREQUENCY: 1x/week  PT DURATION: 8 weeks  PLANNED INTERVENTIONS: Therapeutic exercises, Therapeutic activity, Neuromuscular re-education, Balance training, Gait training, Patient/Family education, Self Care, Joint mobilization, Aquatic Therapy, Dry Needling, Electrical stimulation, Spinal mobilization, Cryotherapy, Moist heat, Taping, Traction, Manual therapy, and Re-evaluation  PLAN FOR NEXT SESSION: f/u on postural correction (  timed for goal), SNAG and supported neutral head positioning for SCM stretching on Rt, STM or DN to neck and upper traps as [employer], PT 06/27/22 1:25 PM       [facility] [address], [address] [place], [place] [address] Phone # [phone] Fax [phone]

## 2022-07-02 ENCOUNTER — Ambulatory Visit: Payer: Medicaid Other | Attending: Nurse Practitioner

## 2022-07-02 DIAGNOSIS — R293 Abnormal posture: Secondary | ICD-10-CM | POA: Diagnosis present

## 2022-07-02 DIAGNOSIS — M542 Cervicalgia: Secondary | ICD-10-CM | POA: Insufficient documentation

## 2022-07-02 NOTE — Therapy (Signed)
OUTPATIENT PHYSICAL THERAPY CERVICAL TREATMENT   Patient Name: David [name] MRN: [id number] DOB:April 29, 1975, 47 y.o., male Today's Date: 07/02/2022  END OF SESSION:  PT End of Session - 07/02/22 1216     Visit Number 5    Date for PT Re-Evaluation 07/26/22    Authorization Type Medicaid CCME- 6 visits    Authorization - Visit Number 1    Authorization - Number of Visits 6    PT Start Time 1148    PT Stop Time 1212    PT Time Calculation (min) 24 min    Activity Tolerance Patient limited by pain;Patient limited by fatigue    Behavior During Therapy [facility] for tasks assessed/performed                 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)    Pneumococcal bacteremia 06/09/2021   Pneumothorax    Left lung spontaneous pneumothorax at age 76 yr    Past Surgical History:  Procedure Laterality Date   chest tubes Left    IR GASTROSTOMY TUBE MOD SED  06/18/2021   LAPAROSCOPIC INSERTION GASTROSTOMY TUBE N/A 06/20/2021   Procedure: LAPAROSCOPIC INSERTION GASTROSTOMY TUBE;  Surgeon: Berna Bue, MD;  Location: [facility] OR;  Service: General;  Laterality: N/A;  DOW PATIENT   TRACHEOSTOMY TUBE PLACEMENT N/A 06/09/2021   Procedure: AWAKE TRACHEOSTOMY;  Surgeon: Christia Reading, MD;  Location: [facility] OR;  Service: ENT;  Laterality: N/A;   Patient Active Problem List   Diagnosis Date Noted   Evaluation by medical service required 03/19/2022   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   Defective dental restoration 12/31/2021   Chronic periodontitis 12/31/2021   Impacted third  molar tooth 12/31/2021   Irritant contact dermatitis associated with digestive stoma 12/25/2021   Irritation around percutaneous endoscopic gastrostomy (PEG) tube site (HCC) 12/25/2021   Leaking PEG tube (HCC) 12/12/2021   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   Continuous tobacco abuse 11/21/2021   Dermatitis associated with moisture 10/16/2021   Back pain 08/16/2021   Candidal endocarditis    History of radiation to head and neck region 07/11/2021   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   Dysgeusia 07/11/2021   S/P percutaneous endoscopic gastrostomy (PEG) tube placement (HCC) 06/24/2021   Pressure injury of skin 06/17/2021   Tracheostomy status (HCC)    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   Heart failure with reduced ejection fraction (HCC) 04/28/2021   Elevated troponin 04/01/2021   Elevated transaminase level 04/01/2021   Weight loss, unintentional 01/15/2021   Protein-calorie malnutrition, severe (HCC) 01/10/2021   Hypotension 01/08/2021   AKI (acute kidney injury) (HCC) 01/08/2021   Chemotherapy induced nausea and vomiting 01/01/2021   Encounter for dental examination 12/13/2020   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   Teeth missing 12/04/2020   Radiation caries 12/04/2020   Retained tooth root 12/04/2020   Chronic apical periodontitis 12/04/2020   Accretions on teeth 12/04/2020    PCP: Adron Bene, MD   REFERRING PROVIDER: Glee Arvin, NP   REFERRING DIAG: Diagnosis Information  Diagnosis  C32.1 (ICD-10-CM) - Malignant neoplasm of supraglottis (HCC)  Z51.5 (ICD-10-CM) -  Palliative care patient  Eval and treat for neck pain   THERAPY DIAG:  Cervicalgia  Abnormal posture  Rationale for Evaluation and Treatment: Rehabilitation  ONSET DATE: 1 year history (trach placement)  SUBJECTIVE:                                                                                                                                                                                                          SUBJECTIVE STATEMENT: I just woke up so I am having a harder time sitting up straight.  I didn't have time to clear my trach    Hand dominance: Right  PERTINENT HISTORY:  Tracheostomy, radiation to neck, malignant neoplasm of supraglottis  PAIN:  Are you having pain? Yes: NPRS scale: 3/10 Pain location: Lt>Rt neck and thoracic spine  Pain description: stiff, throbbing Aggravating factors: overstretching Relieving factors: not moving   PRECAUTIONS: Other: cancer, trach  WEIGHT BEARING RESTRICTIONS: No  FALLS:  Has patient fallen in last 6 months? No  LIVING ENVIRONMENT: Lives with: lives alone Lives in: House/apartment   PLOF: Independent, walking to mailbox  PATIENT GOALS: reduce neck pain, improve mobility   NEXT MD VISIT:   OBJECTIVE:   DIAGNOSTIC FINDINGS:  None   PATIENT SURVEYS:  5/30 NDI: 12/45 NDI 11/45  COGNITION: Overall cognitive status: Within functional limits for tasks assessed  SENSATION: WFL  POSTURE: rounded shoulders, forward head, increased thoracic kyphosis, and flexed trunk   PALPATION: 5/30:  Significant adaptive shortening of Rt SCM, lengthened enough now for Pt to be able to achieve neutral posture from resting in flexion/Rt SB/Lt Rot Over-lengthened and painful Lt cervical paraspinals and upper trap secondary to tone in Rt SCM Very tender SCM beneath jaw   Significant tension and reduced muscle length in bil neck. Pain on Rt vs Lt.    CERVICAL ROM:   Active ROM A/PROM (deg) eval A/ROM  Flexion Rests in flexion Rests in  55 deg flexion, Rt SB, Lt Rot:  Able to  Right posture to 15 deg flexion  Extension  Rests in flexion, Rt SB, Lt Rot: Able to correct to 15 deg flexion   Right lateral flexion Rests in 30 degrees    Left lateral flexion 10 degrees Rt lateral flexion    Right rotation WFLs From 15 deg flexion, 45  deg   Left rotation WFLs From 15 deg flexion, 45 deg    (Blank rows =  not tested)  UPPER EXTREMITY ROM:  UE A/ROM is WFLs without pain  UPPER EXTREMITY MMT:  5/30 Neck strength: able  to perform postural correction to near neutral and hold 36 sec x 1 reps  4+/5 bil UE strength, neck strength 4-/5 throughout   TODAY'S TREATMENT:                                                                                                                              DATE:  07/02/22: Seated STM Rt scalenes, SCM, upper trap to tolerance  Reviewed practice with posture   06/27/22: NDI, ROM, MMT, review of HEP Seated STM Rt scalenes, SCM, upper trap to tolerance  Seated P/ROM toward Lt SB, extension, Rt Rot to tolerance Discussed trying laying on Lt side to work on gravity assisted stretching of Rt cervical muscles  06/19/22: Postural correction to lengthen Rt SCM 3x30" Pt to add Rt UE support to Rt side of head to give supported stretch to Rt cervical muscles Cervical SNAG using towel into Lt rotation (HEP) Postural cueing for how to right head and level ears throughout session as tol      PATIENT EDUCATION:  Education details: Access Code: [email or web address] Person educated: Patient Education method: Explanation, Demonstration, and Handouts Education comprehension: verbalized understanding and returned demonstration  HOME EXERCISE PROGRAM: Access Code: [email or web address] URL: [email or web address] Date: 06/19/2022 Prepared by: Loistine Simas Beuhring  Exercises - Seated Scapular Retraction  - 5 x daily - 7 x weekly - 1 sets - 10 reps - 5 hold - Supine Scapular Retraction  - 3 x daily - 7 x weekly - 1 sets - 10 reps - 5 hold - Seated Correct Posture  - 1 x daily - 7 x weekly - 3 sets - 10 reps - Seated Cervical Retraction  - 1 x daily - 7 x weekly - 3 sets - 10 reps - Supine Chin Tuck  - 1 x daily - 7 x weekly - 3 sets - 10 reps - Seated Cervical Sidebending AROM  - 3 x daily - 7 x weekly - 1 sets - 10 reps - 5-10 hold - Seated Assisted Cervical Rotation with Towel  - 5  x daily - 7 x weekly - 1 sets - 3 reps - 10 hold  ASSESSMENT:  CLINICAL IMPRESSION: Pt reports that he just woke up and didn't have time to clear his trach.  He requested a short session.  He reports that he can hold his head upright for ~8-10 seconds at home.  Pt with significant tension and muscle shortening on the Rt side of neck.  He was hypersensitive to manual work over Rt SCM so more focus on paraspinals and upper trap today.   Pt is motivated to continue with PT and will benefit from continued skilled PT for manual techniques to complement his HEP to improve muscle tightness, ROM and strength to improve QOL.  He stated he feels like he has more purpose by way of his HEP and even expressed interest in finding a hobby to do with his hands now that he is moving  better such as building models or painting/drawing.   OBJECTIVE IMPAIRMENTS: decreased activity tolerance, increased muscle spasms, impaired flexibility, postural dysfunction, and pain.   ACTIVITY LIMITATIONS: sitting, standing, and reach over head  PARTICIPATION LIMITATIONS: meal prep, driving, and community activity  PERSONAL FACTORS: 1-2 comorbidities: cancer on pallative care, chronic postural dysfunction  are also affecting patient's functional outcome.   REHAB POTENTIAL: Good  CLINICAL DECISION MAKING: Stable/uncomplicated  EVALUATION COMPLEXITY: Moderate   GOALS: Goals reviewed with patient? Yes  SHORT TERM GOALS: Target date: 06/27/2022    Be independent in initial  HEP Baseline:  Goal status: met 5/30  2.  Verbalize and demonstrate postural corrections to improve tolerance for neutral posture and reduced neck strain  Baseline:  Goal status: met 5/30   LONG TERM GOALS: Target date: 07/26/22  Be independent in advanced HEP Baseline:  Goal status: ongoing  3.  Improve NDI to < or = to 7/45 Baseline: 11/45 Goal status: 12/45  3.   Perform independent postural correction and hold this x 1-2 minutes   Baseline: needs visual feedback and can hold <20 seconds  Goal status: can hold 36 sec 5/30   4.   Report > or = to 40% reduction in neck pain due to reduced tension and strain in neck Baseline: 3-5/10 Goal status: ongoing 5/30, no change in pain but Pt feels improved mobility    PLAN:  PT FREQUENCY: 1x/week  PT DURATION: 8 weeks  PLANNED INTERVENTIONS: Therapeutic exercises, Therapeutic activity, Neuromuscular re-education, Balance training, Gait training, Patient/Family education, Self Care, Joint mobilization, Aquatic Therapy, Dry Needling, Electrical stimulation, Spinal mobilization, Cryotherapy, Moist heat, Taping, Traction, Manual therapy, and Re-evaluation  PLAN FOR NEXT SESSION: f/u on postural correction (timed for goal), SNAG and supported neutral head positioning for SCM stretching on Rt, STM or DN to neck and upper traps as Ace Gins, PT 07/02/22 12:19 PM        [facility] [address], [address] [place], [place] [address] Phone # [phone] Fax [phone]

## 2022-07-03 ENCOUNTER — Encounter: Payer: Self-pay | Admitting: *Deleted

## 2022-07-03 ENCOUNTER — Telehealth: Payer: Self-pay

## 2022-07-03 NOTE — Telephone Encounter (Signed)
1617 Palliative Care Note  Volunteer attempted to call pt for palliative care check in yesterday 07/02/22. No answer. Mailbox is full so unable to leave voicemail.  Barbette Merino, RN

## 2022-07-04 ENCOUNTER — Ambulatory Visit (HOSPITAL_COMMUNITY)
Admission: RE | Admit: 2022-07-04 | Discharge: 2022-07-04 | Disposition: A | Discharge status: Home / Self Care | Payer: Medicaid Other | Source: Ambulatory Visit | Attending: Acute Care | Admitting: Acute Care

## 2022-07-04 DIAGNOSIS — Z930 Tracheostomy status: Secondary | ICD-10-CM

## 2022-07-04 DIAGNOSIS — M436 Torticollis: Secondary | ICD-10-CM | POA: Diagnosis not present

## 2022-07-04 DIAGNOSIS — C329 Malignant neoplasm of larynx, unspecified: Secondary | ICD-10-CM | POA: Diagnosis present

## 2022-07-04 DIAGNOSIS — J386 Stenosis of larynx: Secondary | ICD-10-CM | POA: Diagnosis present

## 2022-07-04 NOTE — Progress Notes (Signed)
Tracheostomy Procedure Note  David [name] [id number] [birth year]  Pre Procedure Tracheostomy Information  Trach Brand: Shiley Size:  4.0 [id number] Style: Uncuffed Secured by: Velcro   Procedure: Trach Cleaning and Trach Change    Post Procedure Tracheostomy Information  Trach Brand: Shiley Size:  4.0 [id number] Style: Uncuffed Secured by: Velcro   Post Procedure Evaluation:  ETCO2 positive color change from yellow to purple : Yes.   Vital signs:VSS Patients current condition: stable Complications: No apparent complications Trach site exam: clean, dry Wound care done: 4 x 4 gauze drain Patient did tolerate procedure well.   Education: none  Prescription needs: none    Additional needs: New PMV given to patient today

## 2022-07-04 NOTE — Progress Notes (Signed)
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 10/26 heme/onc f/u: ET/CT which was suggestive of possible residual disease but exploration and biopsy showed no evidence of malignancy which is very reassuring. Plan was to see onc in a year 11/9 out-pt SLP recommended he continue asp precautions and exercises  11/14 seen by ENT Procedure: Flexible fiberoptic laryngoscopy  "The right pyriform is effaced but no visible tumor seen. The epiglottis is edematous, as is the right aryepiglottic fold. I cannot see any visible ulcers or tumor of the endolarynx although visualization was difficult due to edema. Vocal fold mobility was not seen on the right. Airway very narrow due to the right arytenoid/AE fold edema" 2/13 seen by ENT. Ordered CT but did not see evidence of active disease  2/21 seen trach clinic. Holding off on moving towards decannulation as pt still had evidence of air trapping w/ trach occlusion.  4/18 presents for planned trach change. No sig new events since last visit. Feels like secretions better. Does report decreased neck ROM 5/2 seeing PT starting dry needling and weekly PT 6/6 Still working w/ PT. Nothing new noted from last rad/onc visit.   Review of Systems  Constitutional: Negative.   HENT: Negative.    Eyes: Negative.   Respiratory: Negative.    Cardiovascular: Negative.   Gastrointestinal: Negative.   Genitourinary: Negative.   Musculoskeletal:  Positive for neck pain.       Neck still very stiff   Skin: Negative.   Neurological: Negative.   Endo/Heme/Allergies: Negative.   All other systems reviewed and are negative.   Exam  General 47 year old wm sitting up in chair. No distress HENT NCAT # 4 trach is unremarkable. His neck contracture favoring the right shoulder continues to be present. He also has mild increased lymphedema over right lateral mandible Pulm clear  Card rrr Abd soft Ext warm and dry     Procedure: the #4 trach was removed. Stoma was assessed and is mid-line. A new 4 trach was placed over obturator. Placement was confirmed via ETCO2. Pt tolerated well    Active Problems:   Tracheostomy status (HCC)   Laryngeal cancer (HCC)   Subglottic stenosis  Tracheostomy status (HCC) Overview: Brand: shiley Size: 4 cuffless  Last change in clinic: 6/6 , prior was  5/15  Discussion Trach is unremarkable. Still has sig air trapping w/ capping valve. I am less confident that he will ever be a candidate for decannulation   Plan ROV 4 weeks Cont PMV as tolerated I am doubtful we will get him decannulated   Torticollis Overview:  Started PT several weeks ago Not much better. [name] tells me there is some concern about the trach and what staff can do in regards to his neck PT Plan I have provided my cell number so that the PT can reach out to me if questions    My time 14 min    Simonne Martinet ACNP-BC [facility] Pulmonary/Critical Care Pager # [phone] OR # [phone] if no answer

## 2022-07-09 NOTE — Progress Notes (Unsigned)
Palliative Medicine [facility]  [phone] [phone]   Name: David [name] Date: 07/09/2022 MRN: [id number]  DOB: 1975-12-20  Patient Care Team: Adron Bene, MD (Inactive) as PCP - General (Internal Medicine) [name], Lise Auer, RN as Oncology Nurse Navigator [name], Skeet Latch, DO as Consulting Physician (Otolaryngology) Lonie Peak, MD as Consulting Physician (Radiation Oncology) Rachel Moulds, MD as Consulting Physician (Hematology and Oncology) Pickenpack-Cousar, Arty Baumgartner, NP as Nurse Practitioner (Nurse Practitioner)    REASON FOR CONSULTATION: David [name] is a 47 y.o. 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 He was recently hospitalized for several months at [facility] where he received treatment for fungal endocarditis and required emergent tracheostomy and PEG tube. During hospitalization CT of chest showed concerns for residual vs recurrent tumor. Palliative requested to re-engage for ongoing support and goals of care.    SOCIAL HISTORY:     reports that he has been smoking cigarettes. He has been smoking an average of 3 packs per day. He has never used smokeless tobacco. He reports that he does not currently use alcohol after a past usage of about 12.0 standard drinks of alcohol per week. He reports that he does not currently use drugs after having used the following drugs: IV.  ADVANCE DIRECTIVES:  Patient reports he does not have advanced directives.  Would like to further discuss in the future.  CODE STATUS: Full code  PAST MEDICAL HISTORY: Past Medical History:  Diagnosis Date   Acute respiratory failure with hypoxia (HCC)    Aspiration pneumonia of right lower lobe (HCC) 06/06/2021    Bacterial endocarditis    Chronic HFrEF (heart failure with reduced ejection fraction) (HCC) 06/09/2021   Community acquired pneumonia due to Pneumococcus (HCC) 04/01/2021   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HCC)    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)    Pneumococcal bacteremia 06/09/2021   Pneumothorax    Left lung spontaneous pneumothorax at age 61 yr     PAST SURGICAL HISTORY:  Past Surgical History:  Procedure Laterality Date   chest tubes Left    IR GASTROSTOMY TUBE MOD SED  06/18/2021   LAPAROSCOPIC INSERTION GASTROSTOMY TUBE N/A 06/20/2021   Procedure: LAPAROSCOPIC INSERTION GASTROSTOMY TUBE;  Surgeon: Berna Bue, MD;  Location: [facility] OR;  Service: General;  Laterality: N/A;  DOW PATIENT   TRACHEOSTOMY TUBE PLACEMENT N/A 06/09/2021   Procedure: AWAKE TRACHEOSTOMY;  Surgeon: Christia Reading, MD;  Location: [facility] OR;  Service: ENT;  Laterality: N/A;      ALLERGIES:  has No Known Allergies.  MEDICATIONS:  Current Outpatient Medications  Medication Sig Dispense Refill   fluconazole (DIFLUCAN) 200 MG tablet Take 4 tablets (800 mg total) by mouth daily. 120 tablet 3   lidocaine (LIDODERM) 5 % Place 1 patch onto the skin daily. Remove & Discard patch within 12 hours or as directed by MD 30 patch 2   lidocaine (XYLOCAINE) 2 % solution Patient: Mix 1part 2% viscous lidocaine, 1part H20. Swish and spit 10mL of diluted mixture up to eight times a day, prn soreness 200 mL 5   methadone (DOLOPHINE) 10 MG/ML solution Take 6 mLs (60 mg total) by mouth daily. (Patient taking differently: Take 90 mg by mouth daily.) 90 mL 0   midodrine (PROAMATINE) 5 MG tablet Take 1 tablet (5 mg total) by mouth  3 (three) times daily with meals. 100 tablet 6   ondansetron (ZOFRAN) 8 MG tablet Take 1 tablet (8 mg total) by mouth every 8 (eight) hours as needed for nausea or vomiting. Starting day 3 after chemotherapy 30 tablet 2   Pregabalin 20 MG/ML SOLN Take 20 mg by mouth 3 (three) times daily. 60 mL 1    No current facility-administered medications for this visit.    VITAL SIGNS: There were no vitals taken for this visit. There were no vitals filed for this visit.     Estimated body mass index is 13.67 kg/m as calculated from the following:   Height as of 06/14/22: 5\' 10"  (1.778 m).   Weight as of 06/14/22: 95 lb 4.8 oz (43.2 kg).  PERFORMANCE STATUS (ECOG) : 1 - Symptomatic but completely ambulatory  Physical Exam General: NAD HEENT: Trach/Passy Muir valve in place, neck contracture  Cardiovascular: regular rate and rhythm Pulmonary: normal breathing pattern  Abdomen: soft, tender, + bowel sounds Extremities: no edema, some head contracture to right side  Neurological: AAOx3, mood appropriate   IMPRESSION: David [name] presents to clinic with his mother for symptom management follow-up. No acute distress noted. He continues to work with PT due to his right neck contraction. Overall is doing well. Denies nausea, vomiting, constipation, or diarrhea.   Remains active with [facility] for daily methadone dosing. David [name] is tolerating Lyrica however reports discomfort has increased with therapy. Education provided on medication. Will increase dose to 40 mg three times daily. Emphasized taking medication as directing assuring he is using supplied measuring tool. He and mother verbalized understanding.   Ongoing support. Patient and mother knows to contact office as needed.   PLAN:  Emotional support. Lidocaine patch to back  Pregabalin 30 mg 3 times daily  Education provided on nutrition in the setting of poor dentition.  I will plan to see patient back in the clinic in 4-6 weeks in collaboration with his other oncology appointments.   Patient and mother expressed understanding and was in agreement with this plan. He also understands that He can call the clinic at any time with any questions, concerns, or complaints.        Any controlled substances utilized were prescribed in  the context of palliative care. PDMP has been reviewed.    Visit consisted of counseling and education dealing with the complex and emotionally intense issues of symptom management and palliative care in the setting of serious and potentially life-threatening illness.Greater than 50%  of this time was spent counseling and coordinating care related to the above assessment and plan.  Willette Alma, AGPCNP-BC  Palliative Medicine Team/[facility]  *Please note that this is a verbal dictation therefore any spelling or grammatical errors are due to the "Dragon Medical One" system interpretation.

## 2022-07-10 ENCOUNTER — Inpatient Hospital Stay: Payer: Medicaid Other | Attending: Radiation Oncology | Admitting: Nurse Practitioner

## 2022-07-10 ENCOUNTER — Encounter: Payer: Self-pay | Admitting: Nurse Practitioner

## 2022-07-10 ENCOUNTER — Other Ambulatory Visit: Payer: Self-pay

## 2022-07-10 ENCOUNTER — Ambulatory Visit: Payer: Medicaid Other | Admitting: Physical Therapy

## 2022-07-10 ENCOUNTER — Encounter: Payer: Self-pay | Admitting: Physical Therapy

## 2022-07-10 VITALS — BP 101/82 | HR 83 | Temp 98.2°F | Resp 17 | Ht 67.0 in | Wt 95.4 lb

## 2022-07-10 DIAGNOSIS — M542 Cervicalgia: Secondary | ICD-10-CM | POA: Diagnosis not present

## 2022-07-10 DIAGNOSIS — Z515 Encounter for palliative care: Secondary | ICD-10-CM

## 2022-07-10 DIAGNOSIS — G893 Neoplasm related pain (acute) (chronic): Secondary | ICD-10-CM

## 2022-07-10 DIAGNOSIS — R293 Abnormal posture: Secondary | ICD-10-CM

## 2022-07-10 DIAGNOSIS — C329 Malignant neoplasm of larynx, unspecified: Secondary | ICD-10-CM | POA: Diagnosis not present

## 2022-07-10 MED ORDER — PREGABALIN 20 MG/ML PO SOLN
40.0000 mg | Freq: Three times a day (TID) | ORAL | 1 refills | Status: DC
Start: 2022-07-10 — End: 2022-09-11

## 2022-07-10 NOTE — Therapy (Signed)
OUTPATIENT PHYSICAL THERAPY CERVICAL TREATMENT   Patient Name: David [name] MRN: [id number] DOB:03-30-1975, 47 y.o., male Today's Date: 07/10/2022  END OF SESSION:  PT End of Session - 07/10/22 1019     Visit Number 6    Date for PT Re-Evaluation 07/26/22    Authorization Type Medicaid CCME- 6 visits    Authorization - Visit Number 2    Authorization - Number of Visits 6    PT Start Time 1019    PT Stop Time 1050    PT Time Calculation (min) 31 min    Activity Tolerance Patient limited by pain;Patient limited by fatigue    Behavior During Therapy [facility] for tasks assessed/performed                  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)    Pneumococcal bacteremia 06/09/2021   Pneumothorax    Left lung spontaneous pneumothorax at age 52 yr    Past Surgical History:  Procedure Laterality Date   chest tubes Left    IR GASTROSTOMY TUBE MOD SED  06/18/2021   LAPAROSCOPIC INSERTION GASTROSTOMY TUBE N/A 06/20/2021   Procedure: LAPAROSCOPIC INSERTION GASTROSTOMY TUBE;  Surgeon: Berna Bue, MD;  Location: [facility] OR;  Service: General;  Laterality: N/A;  DOW PATIENT   TRACHEOSTOMY TUBE PLACEMENT N/A 06/09/2021   Procedure: AWAKE TRACHEOSTOMY;  Surgeon: Christia Reading, MD;  Location: [facility] OR;  Service: ENT;  Laterality: N/A;   Patient Active Problem List   Diagnosis Date Noted   Torticollis 07/04/2022   Evaluation by medical service required 03/19/2022   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   Defective dental restoration 12/31/2021   Chronic periodontitis  12/31/2021   Impacted third molar tooth 12/31/2021   Irritant contact dermatitis associated with digestive stoma 12/25/2021   Irritation around percutaneous endoscopic gastrostomy (PEG) tube site (HCC) 12/25/2021   Leaking PEG tube (HCC) 12/12/2021   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   Continuous tobacco abuse 11/21/2021   Dermatitis associated with moisture 10/16/2021   Back pain 08/16/2021   Candidal endocarditis    History of radiation to head and neck region 07/11/2021   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   Dysgeusia 07/11/2021   S/P percutaneous endoscopic gastrostomy (PEG) tube placement (HCC) 06/24/2021   Pressure injury of skin 06/17/2021   Tracheostomy status (HCC)    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   Heart failure with reduced ejection fraction (HCC) 04/28/2021   Elevated troponin 04/01/2021   Elevated transaminase level 04/01/2021   Weight loss, unintentional 01/15/2021   Protein-calorie malnutrition, severe (HCC) 01/10/2021   Hypotension 01/08/2021   AKI (acute kidney injury) (HCC) 01/08/2021   Chemotherapy induced nausea and vomiting 01/01/2021   Encounter for dental examination 12/13/2020   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   Teeth missing 12/04/2020   Radiation caries 12/04/2020   Retained tooth root 12/04/2020   Chronic apical periodontitis 12/04/2020   Accretions on teeth 12/04/2020    PCP: Adron Bene, MD   REFERRING PROVIDER: Glee Arvin, NP   REFERRING DIAG: Diagnosis Information  Diagnosis  C32.1 (ICD-10-CM) - Malignant neoplasm of supraglottis (  HCC)  Z51.5 (ICD-10-CM) - Palliative care patient  Eval and treat for neck pain   THERAPY DIAG:  Cervicalgia  Abnormal posture  Rationale for Evaluation and Treatment: Rehabilitation  ONSET DATE: 1 year history (trach placement)  SUBJECTIVE:                                                                                                                                                                                                          SUBJECTIVE STATEMENT: I am doing my HEP.  I am able to look up and ahead more.     Hand dominance: Right  PERTINENT HISTORY:  Tracheostomy, radiation to neck, malignant neoplasm of supraglottis  PAIN:  Are you having pain? Yes: NPRS scale: 3/10 Pain location: Lt>Rt neck and thoracic spine  Pain description: stiff, throbbing Aggravating factors: overstretching Relieving factors: not moving   PRECAUTIONS: Other: cancer, trach  WEIGHT BEARING RESTRICTIONS: No  FALLS:  Has patient fallen in last 6 months? No  LIVING ENVIRONMENT: Lives with: lives alone Lives in: House/apartment   PLOF: Independent, walking to mailbox  PATIENT GOALS: reduce neck pain, improve mobility   NEXT MD VISIT:   OBJECTIVE:   DIAGNOSTIC FINDINGS:  None   PATIENT SURVEYS:  5/30 NDI: 12/45 NDI 11/45  COGNITION: Overall cognitive status: Within functional limits for tasks assessed  SENSATION: WFL  POSTURE: rounded shoulders, forward head, increased thoracic kyphosis, and flexed trunk   PALPATION: 5/30:  Significant adaptive shortening of Rt SCM, lengthened enough now for Pt to be able to achieve neutral posture from resting in flexion/Rt SB/Lt Rot Over-lengthened and painful Lt cervical paraspinals and upper trap secondary to tone in Rt SCM Very tender SCM beneath jaw   Significant tension and reduced muscle length in bil neck. Pain on Rt vs Lt.    CERVICAL ROM:   Active ROM A/PROM (deg) eval A/ROM  Flexion Rests in flexion Rests in  55 deg flexion, Rt SB, Lt Rot:  Able to  Right posture to 15 deg flexion  Extension  Rests in flexion, Rt SB, Lt Rot: Able to correct to 15 deg flexion   Right lateral flexion Rests in 30 degrees    Left lateral flexion 10 degrees Rt lateral flexion    Right rotation WFLs From 15 deg flexion, 45  deg   Left rotation WFLs From 15 deg flexion, 45 deg    (Blank rows = not  tested)  UPPER EXTREMITY ROM:  UE A/ROM is WFLs without pain  UPPER EXTREMITY MMT:  5/30 Neck strength: able to perform  postural correction to near neutral and hold 36 sec x 1 reps  4+/5 bil UE strength, neck strength 4-/5 throughout   TODAY'S TREATMENT:                                                                                                                              DATE:  07/10/22: Pt education: idea of supportive neck brace options that will accommodate trach - gave [facility] handout to inflatable neck brace to look into Lt sidelying gravity assisted stretching multiple angles to combat SCM tightness on Rt Lt sidelying isometrics Lt SB, Rt Rot Lt sidelying with PT manually supporting head position STM and cervical joint mobs along facets and U joints Gr I-III Seated cervical SNAG to neutral 2x20", add neck extension into ball on wall 5x5"  07/02/22: Seated STM Rt scalenes, SCM, upper trap to tolerance  Reviewed practice with posture   06/27/22: NDI, ROM, MMT, review of HEP Seated STM Rt scalenes, SCM, upper trap to tolerance  Seated P/ROM toward Lt SB, extension, Rt Rot to tolerance Discussed trying laying on Lt side to work on gravity assisted stretching of Rt cervical muscles  06/19/22: Postural correction to lengthen Rt SCM 3x30" Pt to add Rt UE support to Rt side of head to give supported stretch to Rt cervical muscles Cervical SNAG using towel into Lt rotation (HEP) Postural cueing for how to right head and level ears throughout session as tol      PATIENT EDUCATION:  Education details: Access Code: [email or web address] Person educated: Patient Education method: Explanation, Demonstration, and Handouts Education comprehension: verbalized understanding and returned demonstration  HOME EXERCISE PROGRAM: Access Code: [email or web address] URL: [email or web address] Date: 06/19/2022 Prepared by: Loistine Simas Natasia Sanko  Exercises - Seated Scapular Retraction  - 5 x daily - 7 x  weekly - 1 sets - 10 reps - 5 hold - Supine Scapular Retraction  - 3 x daily - 7 x weekly - 1 sets - 10 reps - 5 hold - Seated Correct Posture  - 1 x daily - 7 x weekly - 3 sets - 10 reps - Seated Cervical Retraction  - 1 x daily - 7 x weekly - 3 sets - 10 reps - Supine Chin Tuck  - 1 x daily - 7 x weekly - 3 sets - 10 reps - Seated Cervical Sidebending AROM  - 3 x daily - 7 x weekly - 1 sets - 10 reps - 5-10 hold - Seated Assisted Cervical Rotation with Towel  - 5 x daily - 7 x weekly - 1 sets - 3 reps - 10 hold  ASSESSMENT:  CLINICAL IMPRESSION: Pt and PT discussed through his frustration that his neck position and tightness is not a quick fix.  PT had previously discussed with Pt's respiratory therapist the idea of supportive neck brace so gave print out to look into inflatable option which could accommodate trach.  Pt continues to have signif rigid stiffness along SCM.  He  was able to tolerate progression of STM, ROM, and joint mobs in Lt SL today for gravity assisted manual stretching and manual techniques by PT.  He does demo improved range is postural correction and will continue to make gains with compliance with HEP.  Pt is motivated to continue with PT and will benefit from continued skilled PT for manual techniques to complement his HEP to improve muscle tightness, ROM and strength to improve QOL.  He stated he feels like he has more purpose by way of his HEP and even expressed interest in finding a hobby to do with his hands now that he is moving better such as building models or painting/drawing.   OBJECTIVE IMPAIRMENTS: decreased activity tolerance, increased muscle spasms, impaired flexibility, postural dysfunction, and pain.   ACTIVITY LIMITATIONS: sitting, standing, and reach over head  PARTICIPATION LIMITATIONS: meal prep, driving, and community activity  PERSONAL FACTORS: 1-2 comorbidities: cancer on pallative care, chronic postural dysfunction  are also affecting patient's  functional outcome.   REHAB POTENTIAL: Good  CLINICAL DECISION MAKING: Stable/uncomplicated  EVALUATION COMPLEXITY: Moderate   GOALS: Goals reviewed with patient? Yes  SHORT TERM GOALS: Target date: 06/27/2022    Be independent in initial  HEP Baseline:  Goal status: met 5/30  2.  Verbalize and demonstrate postural corrections to improve tolerance for neutral posture and reduced neck strain  Baseline:  Goal status: met 5/30   LONG TERM GOALS: Target date: 07/26/22  Be independent in advanced HEP Baseline:  Goal status: ongoing  3.  Improve NDI to < or = to 7/45 Baseline: 11/45 Goal status: 12/45  3.   Perform independent postural correction and hold this x 1-2 minutes  Baseline: needs visual feedback and can hold <20 seconds  Goal status: can hold 36 sec 5/30   4.   Report > or = to 40% reduction in neck pain due to reduced tension and strain in neck Baseline: 3-5/10 Goal status: ongoing 5/30, no change in pain but Pt feels improved mobility    PLAN:  PT FREQUENCY: 1x/week  PT DURATION: 8 weeks  PLANNED INTERVENTIONS: Therapeutic exercises, Therapeutic activity, Neuromuscular re-education, Balance training, Gait training, Patient/Family education, Self Care, Joint mobilization, Aquatic Therapy, Dry Needling, Electrical stimulation, Spinal mobilization, Cryotherapy, Moist heat, Taping, Traction, Manual therapy, and Re-evaluation  PLAN FOR NEXT SESSION: did Pt get inflatable neck brace?  Lt SL gravity assisted stretching and manual techniques, f/u on postural correction (timed for goal), SNAG and supported neutral head positioning for SCM stretching on Rt, STM or DN to neck and upper traps as [employer], PT 07/10/22 10:56 AM        [facility] [address] [place], [place] [address] Phone # [phone] Fax [phone]

## 2022-07-17 ENCOUNTER — Ambulatory Visit: Payer: Medicaid Other

## 2022-07-17 DIAGNOSIS — R293 Abnormal posture: Secondary | ICD-10-CM

## 2022-07-17 DIAGNOSIS — M542 Cervicalgia: Secondary | ICD-10-CM | POA: Diagnosis not present

## 2022-07-17 NOTE — Therapy (Signed)
OUTPATIENT PHYSICAL THERAPY CERVICAL TREATMENT   Patient Name: David [name] MRN: [id number] DOB:03-16-1975, 47 y.o., male Today's Date: 07/17/2022  END OF SESSION:  PT End of Session - 07/17/22 1128     Visit Number 7    Date for PT Re-Evaluation 07/26/22    Authorization Type Medicaid CCME- 6 visits    Authorization - Visit Number 3    Authorization - Number of Visits 6    PT Start Time 1100    PT Stop Time 1125    PT Time Calculation (min) 25 min    Activity Tolerance Patient limited by pain;Patient limited by fatigue    Behavior During Therapy [facility] for tasks assessed/performed                   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)    Pneumococcal bacteremia 06/09/2021   Pneumothorax    Left lung spontaneous pneumothorax at age 15 yr    Past Surgical History:  Procedure Laterality Date   chest tubes Left    IR GASTROSTOMY TUBE MOD SED  06/18/2021   LAPAROSCOPIC INSERTION GASTROSTOMY TUBE N/A 06/20/2021   Procedure: LAPAROSCOPIC INSERTION GASTROSTOMY TUBE;  Surgeon: Berna Bue, MD;  Location: [facility] OR;  Service: General;  Laterality: N/A;  DOW PATIENT   TRACHEOSTOMY TUBE PLACEMENT N/A 06/09/2021   Procedure: AWAKE TRACHEOSTOMY;  Surgeon: Christia Reading, MD;  Location: [facility] OR;  Service: ENT;  Laterality: N/A;   Patient Active Problem List   Diagnosis Date Noted   Torticollis 07/04/2022   Evaluation by medical service required 03/19/2022   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   Defective dental restoration 12/31/2021   Chronic periodontitis  12/31/2021   Impacted third molar tooth 12/31/2021   Irritant contact dermatitis associated with digestive stoma 12/25/2021   Irritation around percutaneous endoscopic gastrostomy (PEG) tube site (HCC) 12/25/2021   Leaking PEG tube (HCC) 12/12/2021   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   Continuous tobacco abuse 11/21/2021   Dermatitis associated with moisture 10/16/2021   Back pain 08/16/2021   Candidal endocarditis    History of radiation to head and neck region 07/11/2021   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   Dysgeusia 07/11/2021   S/P percutaneous endoscopic gastrostomy (PEG) tube placement (HCC) 06/24/2021   Pressure injury of skin 06/17/2021   Tracheostomy status (HCC)    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   Heart failure with reduced ejection fraction (HCC) 04/28/2021   Elevated troponin 04/01/2021   Elevated transaminase level 04/01/2021   Weight loss, unintentional 01/15/2021   Protein-calorie malnutrition, severe (HCC) 01/10/2021   Hypotension 01/08/2021   AKI (acute kidney injury) (HCC) 01/08/2021   Chemotherapy induced nausea and vomiting 01/01/2021   Encounter for dental examination 12/13/2020   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   Teeth missing 12/04/2020   Radiation caries 12/04/2020   Retained tooth root 12/04/2020   Chronic apical periodontitis 12/04/2020   Accretions on teeth 12/04/2020    PCP: Adron Bene, MD   REFERRING PROVIDER: Glee Arvin, NP   REFERRING DIAG: Diagnosis Information  Diagnosis  C32.1 (ICD-10-CM) - Malignant neoplasm of  supraglottis (HCC)  Z51.5 (ICD-10-CM) - Palliative care patient  Eval and treat for neck pain   THERAPY DIAG:  Cervicalgia  Abnormal posture  Rationale for Evaluation and Treatment: Rehabilitation  ONSET DATE: 1 year history (trach placement)  SUBJECTIVE:                                                                                                                                                                                                          SUBJECTIVE STATEMENT: I got the inflatable collar and it helps.  I am able to work it around the trach.  My neck feels a little bit looser everyday.   Hand dominance: Right  PERTINENT HISTORY:  Tracheostomy, radiation to neck, malignant neoplasm of supraglottis  PAIN:  Are you having pain? Yes: NPRS scale: 3/10 Pain location: Lt>Rt neck and thoracic spine  Pain description: stiff, throbbing Aggravating factors: overstretching Relieving factors: not moving   PRECAUTIONS: Other: cancer, trach  WEIGHT BEARING RESTRICTIONS: No  FALLS:  Has patient fallen in last 6 months? No  LIVING ENVIRONMENT: Lives with: lives alone Lives in: House/apartment   PLOF: Independent, walking to mailbox  PATIENT GOALS: reduce neck pain, improve mobility   NEXT MD VISIT:   OBJECTIVE:   DIAGNOSTIC FINDINGS:  None   PATIENT SURVEYS:  5/30 NDI: 12/45 NDI 11/45  COGNITION: Overall cognitive status: Within functional limits for tasks assessed  SENSATION: WFL  POSTURE: rounded shoulders, forward head, increased thoracic kyphosis, and flexed trunk   PALPATION: 5/30:  Significant adaptive shortening of Rt SCM, lengthened enough now for Pt to be able to achieve neutral posture from resting in flexion/Rt SB/Lt Rot Over-lengthened and painful Lt cervical paraspinals and upper trap secondary to tone in Rt SCM Very tender SCM beneath jaw   Significant tension and reduced muscle length in bil neck. Pain on Rt vs Lt.    CERVICAL ROM:   Active ROM A/PROM (deg) eval A/ROM  Flexion Rests in flexion Rests in  55 deg flexion, Rt SB, Lt Rot:  Able to  Right posture to 15 deg flexion  Extension  Rests in flexion, Rt SB, Lt Rot: Able to correct to 15 deg flexion   Right lateral flexion Rests in 30 degrees    Left lateral flexion 10 degrees Rt lateral flexion    Right rotation WFLs From 15 deg flexion, 45  deg   Left rotation  WFLs From 15 deg flexion, 45 deg    (Blank rows = not tested)  UPPER EXTREMITY ROM:  UE A/ROM is WFLs without pain  UPPER EXTREMITY MMT:  5/30 Neck strength: able to perform postural correction to near neutral and hold 36 sec x 1 reps  4+/5 bil UE strength, neck strength 4-/5 throughout   TODAY'S TREATMENT:      DATE:  07/17/22: Lt sidelying gravity assisted stretching multiple angles  Lt sidelying isometrics Lt SB, Rt Rot Lt sidelying with PT manually supporting head position STM and cervical joint mobs along facets and U joints Gr I-III Elongation to Rt SCM and upper trap in Lt sidelying                                                                                                                     DATE:  07/10/22: Pt education: idea of supportive neck brace options that will accommodate trach - gave [facility] handout to inflatable neck brace to look into Lt sidelying gravity assisted stretching multiple angles to combat SCM tightness on Rt Lt sidelying isometrics Lt SB, Rt Rot Lt sidelying with PT manually supporting head position STM and cervical joint mobs along facets and U joints Gr I-III Seated cervical SNAG to neutral 2x20", add neck extension into ball on wall 5x5"  07/02/22: Seated STM Rt scalenes, SCM, upper trap to tolerance  Reviewed practice with posture   06/27/22: NDI, ROM, MMT, review of HEP Seated STM Rt scalenes, SCM, upper trap to tolerance  Seated P/ROM toward Lt SB, extension, Rt Rot to tolerance Discussed trying laying on Lt side to work on gravity assisted stretching of Rt cervical muscles       PATIENT EDUCATION:  Education details: Access Code: [email or web address] Person educated: Patient Education method: Programmer, multimedia, Facilities manager, and Handouts Education comprehension: verbalized understanding and returned demonstration  HOME EXERCISE PROGRAM: Access Code: [email or web address] URL: [email or web address] Date: 06/19/2022 Prepared by: Loistine Simas  Beuhring  Exercises - Seated Scapular Retraction  - 5 x daily - 7 x weekly - 1 sets - 10 reps - 5 hold - Supine Scapular Retraction  - 3 x daily - 7 x weekly - 1 sets - 10 reps - 5 hold - Seated Correct Posture  - 1 x daily - 7 x weekly - 3 sets - 10 reps - Seated Cervical Retraction  - 1 x daily - 7 x weekly - 3 sets - 10 reps - Supine Chin Tuck  - 1 x daily - 7 x weekly - 3 sets - 10 reps - Seated Cervical Sidebending AROM  - 3 x daily - 7 x weekly - 1 sets - 10 reps - 5-10 hold - Seated Assisted Cervical Rotation with Towel  - 5 x daily - 7 x weekly - 1 sets - 3 reps - 10 hold  ASSESSMENT:  CLINICAL IMPRESSION: Pt has purchased an inflatable collar and is wearing at night for passive stretch.  Pt had limited tolerance for treatment due to sensitivity of Rt SCM and pt continues to have signif rigid stiffness along SCM.  He was able to tolerate progression of STM, ROM, and joint mobs in Lt SL  again today for gravity assisted manual stretching and manual techniques by PT.  He does demo improved range is postural correction and will continue to make gains with compliance with HEP.  Pt is motivated to continue with PT and will benefit from continued skilled PT for manual techniques to complement his HEP to improve muscle tightness, ROM and strength to improve QOL.  He stated he feels like he has more purpose by way of his HEP and even expressed interest in finding a hobby to do with his hands now that he is moving better such as building models or painting/drawing.   OBJECTIVE IMPAIRMENTS: decreased activity tolerance, increased muscle spasms, impaired flexibility, postural dysfunction, and pain.   ACTIVITY LIMITATIONS: sitting, standing, and reach over head  PARTICIPATION LIMITATIONS: meal prep, driving, and community activity  PERSONAL FACTORS: 1-2 comorbidities: cancer on pallative care, chronic postural dysfunction  are also affecting patient's functional outcome.   REHAB POTENTIAL:  Good  CLINICAL DECISION MAKING: Stable/uncomplicated  EVALUATION COMPLEXITY: Moderate   GOALS: Goals reviewed with patient? Yes  SHORT TERM GOALS: Target date: 06/27/2022    Be independent in initial  HEP Baseline:  Goal status: met 5/30  2.  Verbalize and demonstrate postural corrections to improve tolerance for neutral posture and reduced neck strain  Baseline:  Goal status: met 5/30   LONG TERM GOALS: Target date: 07/26/22  Be independent in advanced HEP Baseline:  Goal status: ongoing  3.  Improve NDI to < or = to 7/45 Baseline: 11/45 Goal status: 12/45  3.   Perform independent postural correction and hold this x 1-2 minutes  Baseline: needs visual feedback and can hold <20 seconds  Goal status: can hold 36 sec 5/30   4.   Report > or = to 40% reduction in neck pain due to reduced tension and strain in neck Baseline: 3-5/10 Goal status: ongoing 5/30, no change in pain but Pt feels improved mobility    PLAN:  PT FREQUENCY: 1x/week  PT DURATION: 8 weeks  PLANNED INTERVENTIONS: Therapeutic exercises, Therapeutic activity, Neuromuscular re-education, Balance training, Gait training, Patient/Family education, Self Care, Joint mobilization, Aquatic Therapy, Dry Needling, Electrical stimulation, Spinal mobilization, Cryotherapy, Moist heat, Taping, Traction, Manual therapy, and Re-evaluation  PLAN FOR NEXT SESSION:  Lt SL gravity assisted stretching and manual techniques, f/u on postural correction (timed for goal), SNAG and supported neutral head positioning for SCM stretching on Rt, STM or DN to neck and upper traps as Ace Gins, PT 07/17/22 11:33 AM     [facility] [address], [address] [place], [place] [address] Phone # [phone] Fax [phone]

## 2022-07-23 ENCOUNTER — Telehealth: Payer: Self-pay

## 2022-07-23 NOTE — Telephone Encounter (Signed)
Called pt mother and confirmed upcoming appts.

## 2022-07-24 ENCOUNTER — Ambulatory Visit: Payer: Medicaid Other

## 2022-07-24 DIAGNOSIS — R293 Abnormal posture: Secondary | ICD-10-CM

## 2022-07-24 DIAGNOSIS — M542 Cervicalgia: Secondary | ICD-10-CM

## 2022-07-24 NOTE — Therapy (Signed)
OUTPATIENT PHYSICAL THERAPY CERVICAL TREATMENT   Patient Name: David [name] MRN: [id number] DOB:July 03, 1975, 47 y.o., male Today's Date: 07/24/2022  END OF SESSION:  PT End of Session - 07/24/22 1133     Visit Number 8    Date for PT Re-Evaluation 08/28/22    Authorization Type Medicaid CCME- 6 visits    Authorization - Visit Number 4    Authorization - Number of Visits 6    PT Start Time 1101    PT Stop Time 1132    PT Time Calculation (min) 31 min    Activity Tolerance Patient limited by pain;Patient limited by fatigue    Behavior During Therapy [facility] for tasks assessed/performed                    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)    Pneumococcal bacteremia 06/09/2021   Pneumothorax    Left lung spontaneous pneumothorax at age 36 yr    Past Surgical History:  Procedure Laterality Date   chest tubes Left    IR GASTROSTOMY TUBE MOD SED  06/18/2021   LAPAROSCOPIC INSERTION GASTROSTOMY TUBE N/A 06/20/2021   Procedure: LAPAROSCOPIC INSERTION GASTROSTOMY TUBE;  Surgeon: Berna Bue, MD;  Location: [facility] OR;  Service: General;  Laterality: N/A;  DOW PATIENT   TRACHEOSTOMY TUBE PLACEMENT N/A 06/09/2021   Procedure: AWAKE TRACHEOSTOMY;  Surgeon: Christia Reading, MD;  Location: [facility] OR;  Service: ENT;  Laterality: N/A;   Patient Active Problem List   Diagnosis Date Noted   Torticollis 07/04/2022   Evaluation by medical service required 03/19/2022   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   Defective dental restoration 12/31/2021   Chronic  periodontitis 12/31/2021   Impacted third molar tooth 12/31/2021   Irritant contact dermatitis associated with digestive stoma 12/25/2021   Irritation around percutaneous endoscopic gastrostomy (PEG) tube site (HCC) 12/25/2021   Leaking PEG tube (HCC) 12/12/2021   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   Continuous tobacco abuse 11/21/2021   Dermatitis associated with moisture 10/16/2021   Back pain 08/16/2021   Candidal endocarditis    History of radiation to head and neck region 07/11/2021   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   Dysgeusia 07/11/2021   S/P percutaneous endoscopic gastrostomy (PEG) tube placement (HCC) 06/24/2021   Pressure injury of skin 06/17/2021   Tracheostomy status (HCC)    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   Heart failure with reduced ejection fraction (HCC) 04/28/2021   Elevated troponin 04/01/2021   Elevated transaminase level 04/01/2021   Weight loss, unintentional 01/15/2021   Protein-calorie malnutrition, severe (HCC) 01/10/2021   Hypotension 01/08/2021   AKI (acute kidney injury) (HCC) 01/08/2021   Chemotherapy induced nausea and vomiting 01/01/2021   Encounter for dental examination 12/13/2020   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   Teeth missing 12/04/2020   Radiation caries 12/04/2020   Retained tooth root 12/04/2020   Chronic apical periodontitis 12/04/2020   Accretions on teeth 12/04/2020    PCP: Adron Bene, MD   REFERRING PROVIDER: Glee Arvin, NP   REFERRING DIAG: Diagnosis Information  Diagnosis  C32.1 (ICD-10-CM) - Malignant neoplasm  of supraglottis (HCC)  Z51.5 (ICD-10-CM) - Palliative care patient  Eval and treat for neck pain   THERAPY DIAG:  Cervicalgia  Abnormal posture  Rationale for Evaluation and Treatment: Rehabilitation  ONSET DATE: 1 year history (trach placement)  SUBJECTIVE:                                                                                                                                                                                                          SUBJECTIVE STATEMENT: I am wearing the collar.  My neck is sore and tight today.  I have a very hard time holding my head upright  Hand dominance: Right  PERTINENT HISTORY:  Tracheostomy, radiation to neck, malignant neoplasm of supraglottis  PAIN:  Are you having pain? Yes: NPRS scale: 0/10 Pain location: Lt>Rt neck and thoracic spine  Pain description: stiff, throbbing Aggravating factors: overstretching Relieving factors: not moving   PRECAUTIONS: Other: cancer, trach  WEIGHT BEARING RESTRICTIONS: No  FALLS:  Has patient fallen in last 6 months? No  LIVING ENVIRONMENT: Lives with: lives alone Lives in: House/apartment   PLOF: Independent, walking to mailbox  PATIENT GOALS: reduce neck pain, improve mobility   NEXT MD VISIT:   OBJECTIVE:   DIAGNOSTIC FINDINGS:  None   PATIENT SURVEYS:  5/30 NDI: 12/45 NDI 11/45  COGNITION: Overall cognitive status: Within functional limits for tasks assessed  SENSATION: WFL  POSTURE: rounded shoulders, forward head, increased thoracic kyphosis, and flexed trunk   PALPATION: 5/30:  Significant adaptive shortening of Rt SCM, lengthened enough now for Pt to be able to achieve neutral posture from resting in flexion/Rt SB/Lt Rot Over-lengthened and painful Lt cervical paraspinals and upper trap secondary to tone in Rt SCM Very tender SCM beneath jaw   Significant tension and reduced muscle length in bil neck. Pain on Rt vs Lt.    CERVICAL ROM:   Active ROM A/PROM (deg) eval A/ROM  Flexion Rests in flexion Rests in  55 deg flexion, Rt SB, Lt Rot:  Able to  Right posture to 15 deg flexion  Extension  Rests in flexion, Rt SB, Lt Rot: Able to correct to 15 deg flexion   Right lateral flexion Rests in 30 degrees    Left lateral flexion 10 degrees Rt lateral flexion    Right rotation WFLs From 15 deg flexion, 45  deg   Left rotation WFLs  From 15 deg flexion, 45 deg    (Blank rows = not tested)  UPPER EXTREMITY ROM:  UE A/ROM is WFLs without pain  UPPER EXTREMITY  MMT:  5/30 Neck strength: able to perform postural correction to near neutral and hold 36 sec x 1 reps  4+/5 bil UE strength, neck strength 4-/5 throughout   TODAY'S TREATMENT:       07/24/22: Supine on 2 pillows- Lt rotation and sidebending, resting in neutral posture and hold Manual: elongation to Rt SCM and upper traps in Lt sidelying, then in supine with passive stretch and hold into Lt sidebending and rotation to pt tolerance.              DATE:  07/17/22: Lt sidelying gravity assisted stretching multiple angles  Lt sidelying isometrics Lt SB, Rt Rot Lt sidelying with PT manually supporting head position STM and cervical joint mobs along facets and U joints Gr I-III Elongation to Rt SCM and upper trap in Lt sidelying                                                                                                                     DATE:  07/10/22: Pt education: idea of supportive neck brace options that will accommodate trach - gave [facility] handout to inflatable neck brace to look into Lt sidelying gravity assisted stretching multiple angles to combat SCM tightness on Rt Lt sidelying isometrics Lt SB, Rt Rot Lt sidelying with PT manually supporting head position STM and cervical joint mobs along facets and U joints Gr I-III Seated cervical SNAG to neutral 2x20", add neck extension into ball on wall 5x5"         PATIENT EDUCATION:  Education details: Access Code: [email or web address] Person educated: Patient Education method: Explanation, Demonstration, and Handouts Education comprehension: verbalized understanding and returned demonstration  HOME EXERCISE PROGRAM: Access Code: [email or web address] URL: [email or web address] Date: 06/19/2022 Prepared by: Loistine Simas Beuhring  Exercises - Seated Scapular Retraction  - 5 x daily - 7 x weekly - 1 sets - 10  reps - 5 hold - Supine Scapular Retraction  - 3 x daily - 7 x weekly - 1 sets - 10 reps - 5 hold - Seated Correct Posture  - 1 x daily - 7 x weekly - 3 sets - 10 reps - Seated Cervical Retraction  - 1 x daily - 7 x weekly - 3 sets - 10 reps - Supine Chin Tuck  - 1 x daily - 7 x weekly - 3 sets - 10 reps - Seated Cervical Sidebending AROM  - 3 x daily - 7 x weekly - 1 sets - 10 reps - 5-10 hold - Seated Assisted Cervical Rotation with Towel  - 5 x daily - 7 x weekly - 1 sets - 3 reps - 10 hold  ASSESSMENT:  CLINICAL IMPRESSION: Pt has purchased an inflatable collar and is wearing at night for passive stretch. Pt continues to be limited by tight musculature on the Rt side of his neck.  He is posturally weak and maintaining upright posture remains a challenge due to this.  Pt was able to obtain neutral head posture in supine with passive motion  and soft tissue elongation to musculature.  Pt is sensitive to deep pressure over Rt SCM.  Slow progress due to chronic nature of condition.  Pt does report lower pain levels and improved head alignment at home. Patient will benefit from skilled PT to address the below impairments and improve overall function.   Pt is motivated to continue with PT and will benefit from continued skilled PT for manual techniques to complement his HEP to improve muscle tightness, ROM and strength to improve QOL.  He stated he feels like he has more purpose by way of his HEP and even expressed interest in finding a hobby to do with his hands now that he is moving better such as building models or painting/drawing.   OBJECTIVE IMPAIRMENTS: decreased activity tolerance, increased muscle spasms, impaired flexibility, postural dysfunction, and pain.   ACTIVITY LIMITATIONS: sitting, standing, and reach over head  PARTICIPATION LIMITATIONS: meal prep, driving, and community activity  PERSONAL FACTORS: 1-2 comorbidities: cancer on pallative care, chronic postural dysfunction  are also  affecting patient's functional outcome.   REHAB POTENTIAL: Good  CLINICAL DECISION MAKING: Stable/uncomplicated  EVALUATION COMPLEXITY: Moderate   GOALS: Goals reviewed with patient? Yes  SHORT TERM GOALS: Target date: 06/27/2022    Be independent in initial  HEP Baseline:  Goal status: met 5/30  2.  Verbalize and demonstrate postural corrections to improve tolerance for neutral posture and reduced neck strain  Baseline:  Goal status: met 5/30   LONG TERM GOALS: Target date: 08/28/22  Be independent in advanced HEP Baseline:  Goal status: ongoing  3.  Improve NDI to < or = to 7/45 Baseline: 11/45 Goal status: 12/45  3.   Perform independent postural correction and hold this x 1-2 minutes  Baseline: can hold 30 seconds against gravity, 1 min in supine (07/24/22) Goal status:   4.   Report > or = to 40% reduction in neck pain due to reduced tension and strain in neck Baseline: reduced pain and improved mobility overall (07/24/22) Goal status: In progress     PLAN:  PT FREQUENCY: 1x/week  PT DURATION: 6 weeks  PLANNED INTERVENTIONS: Therapeutic exercises, Therapeutic activity, Neuromuscular re-education, Balance training, Gait training, Patient/Family education, Self Care, Joint mobilization, Aquatic Therapy, Dry Needling, Electrical stimulation, Spinal mobilization, Cryotherapy, Moist heat, Taping, Traction, Manual therapy, and Re-evaluation  PLAN FOR NEXT SESSION:  Lt SL gravity assisted stretching and manual techniques, f/u on postural correction (timed for goal), neutral head in supine and work on postural strength with band  Lorrene Reid, PT 07/24/22 11:46 AM     [facility] [address] [place], [place] [address] Phone # [phone] Fax [phone]

## 2022-07-29 NOTE — Progress Notes (Unsigned)
Palliative Medicine [facility]  [phone] [phone]   Name: David [name] Date: 07/29/2022 MRN: [id number]  DOB: 06/01/1975  Patient Care Team: Faith Rogue, DO as PCP - General [name], Lise Auer, RN as Oncology Nurse Navigator [name], Skeet Latch, DO as Consulting Physician (Otolaryngology) Lonie Peak, MD as Consulting Physician (Radiation Oncology) Rachel Moulds, MD as Consulting Physician (Hematology and Oncology) Pickenpack-Cousar, Arty Baumgartner, NP as Nurse Practitioner (Nurse Practitioner)    REASON FOR CONSULTATION: David [name] is a 47 y.o. 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 He was recently hospitalized for several months at [facility] where he received treatment for fungal endocarditis and required emergent tracheostomy and PEG tube. During hospitalization CT of chest showed concerns for residual vs recurrent tumor. Palliative requested to re-engage for ongoing support and goals of care.    SOCIAL HISTORY:     reports that he has been smoking cigarettes. He has been smoking an average of 3 packs per day. He has never used smokeless tobacco. He reports that he does not currently use alcohol after a past usage of about 12.0 standard drinks of alcohol per week. He reports that he does not currently use drugs after having used the following drugs: IV.  ADVANCE DIRECTIVES:  Patient reports he does not have advanced directives.  Would like to further discuss in the future.  CODE STATUS: Full code  PAST MEDICAL HISTORY: Past Medical History:  Diagnosis Date   Acute respiratory failure with hypoxia (HCC)    Aspiration pneumonia of right lower lobe (HCC) 06/06/2021   Bacterial endocarditis     Chronic HFrEF (heart failure with reduced ejection fraction) (HCC) 06/09/2021   Community acquired pneumonia due to Pneumococcus (HCC) 04/01/2021   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HCC)    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)    Pneumococcal bacteremia 06/09/2021   Pneumothorax    Left lung spontaneous pneumothorax at age 41 yr     PAST SURGICAL HISTORY:  Past Surgical History:  Procedure Laterality Date   chest tubes Left    IR GASTROSTOMY TUBE MOD SED  06/18/2021   LAPAROSCOPIC INSERTION GASTROSTOMY TUBE N/A 06/20/2021   Procedure: LAPAROSCOPIC INSERTION GASTROSTOMY TUBE;  Surgeon: Berna Bue, MD;  Location: [facility] OR;  Service: General;  Laterality: N/A;  DOW PATIENT   TRACHEOSTOMY TUBE PLACEMENT N/A 06/09/2021   Procedure: AWAKE TRACHEOSTOMY;  Surgeon: Christia Reading, MD;  Location: [facility] OR;  Service: ENT;  Laterality: N/A;      ALLERGIES:  has No Known Allergies.  MEDICATIONS:  Current Outpatient Medications  Medication Sig Dispense Refill   fluconazole (DIFLUCAN) 200 MG tablet Take 4 tablets (800 mg total) by mouth daily. 120 tablet 3   lidocaine (LIDODERM) 5 % Place 1 patch onto the skin daily. Remove & Discard patch within 12 hours or as directed by MD 30 patch 2   lidocaine (XYLOCAINE) 2 % solution Patient: Mix 1part 2% viscous lidocaine, 1part H20. Swish and spit 10mL of diluted mixture up to eight times a day, prn soreness 200 mL 5   methadone (DOLOPHINE) 10 MG/ML solution Take 6 mLs (60 mg total) by mouth daily. (Patient taking differently: Take 90 mg by mouth daily.) 90 mL 0   midodrine (PROAMATINE) 5 MG tablet Take 1 tablet (5 mg total) by mouth 3 (three) times  daily with meals. 100 tablet 6   ondansetron (ZOFRAN) 8 MG tablet Take 1 tablet (8 mg total) by mouth every 8 (eight) hours as needed for nausea or vomiting. Starting day 3 after chemotherapy 30 tablet 2   Pregabalin 20 MG/ML SOLN Take 40 mg by mouth 3 (three) times daily. 120 mL 1   No current  facility-administered medications for this visit.    VITAL SIGNS: There were no vitals taken for this visit. There were no vitals filed for this visit.     Estimated body mass index is 14.95 kg/m as calculated from the following:   Height as of 07/10/22: 5\' 7"  (1.702 m).   Weight as of 07/10/22: 95 lb 7 oz (43.3 kg).  PERFORMANCE STATUS (ECOG) : 1 - Symptomatic but completely ambulatory  Physical Exam General: NAD HEENT: Trach/Passy Muir valve in place, neck contracture  Cardiovascular: regular rate and rhythm Pulmonary: normal breathing pattern  Abdomen: soft, tender, + bowel sounds Extremities: no edema, some head contracture to right side  Neurological: AAOx3, mood appropriate   IMPRESSION:  David [name] presents to clinic today for follow-up. His mother Yehuda Mao is present with him. He continues to do well. Is participating in therapy. Has noticeable improvement in neck muscles strengthening.   He is tolerating Lyrica. Unfortunately there was an issue with his prescription refills from his pharmacy. Apparently after speaking with them and verifying prescription they filled an "old" Lyrica prescription from February which is not the correct dosing. Unfortunately patient is now out of medication and unable to fill despite pharmacy error. Patient is aware. Pharmacy notified and requested to only utilize most recent prescriptions due to changes in dosing.   Patient continues with methadone support at [facility].   PLAN:  Emotional support. Lidocaine patch to back  Pregabalin 30 mg 3 times daily  Education provided on nutrition in the setting of poor dentition.  I will plan to see patient back in the clinic in 4-6 weeks in collaboration with his other oncology appointments.   Patient and mother expressed understanding and was in agreement with this plan. He also understands that He can call the clinic at any time with any questions, concerns, or complaints.        Any  controlled substances utilized were prescribed in the context of palliative care. PDMP has been reviewed.    Visit consisted of counseling and education dealing with the complex and emotionally intense issues of symptom management and palliative care in the setting of serious and potentially life-threatening illness.Greater than 50%  of this time was spent counseling and coordinating care related to the above assessment and plan.  Willette Alma, AGPCNP-BC  Palliative Medicine Team/[facility]  *Please note that this is a verbal dictation therefore any spelling or grammatical errors are due to the "Dragon Medical One" system interpretation.

## 2022-07-31 ENCOUNTER — Encounter: Payer: Self-pay | Admitting: Nurse Practitioner

## 2022-07-31 ENCOUNTER — Inpatient Hospital Stay: Payer: MEDICAID | Attending: Nurse Practitioner | Admitting: Nurse Practitioner

## 2022-07-31 ENCOUNTER — Other Ambulatory Visit: Payer: Self-pay

## 2022-07-31 VITALS — BP 112/88 | HR 81 | Temp 99.2°F | Resp 16 | Wt 94.7 lb

## 2022-07-31 DIAGNOSIS — C329 Malignant neoplasm of larynx, unspecified: Secondary | ICD-10-CM | POA: Diagnosis not present

## 2022-07-31 DIAGNOSIS — Z515 Encounter for palliative care: Secondary | ICD-10-CM | POA: Diagnosis not present

## 2022-07-31 DIAGNOSIS — G893 Neoplasm related pain (acute) (chronic): Secondary | ICD-10-CM

## 2022-08-05 ENCOUNTER — Telehealth: Payer: Self-pay | Admitting: Nurse Practitioner

## 2022-08-05 ENCOUNTER — Ambulatory Visit (HOSPITAL_COMMUNITY)
Admission: RE | Admit: 2022-08-05 | Discharge: 2022-08-05 | Disposition: A | Discharge status: Home / Self Care | Payer: MEDICAID | Source: Ambulatory Visit | Attending: Acute Care | Admitting: Acute Care

## 2022-08-05 DIAGNOSIS — Z430 Encounter for attention to tracheostomy: Secondary | ICD-10-CM | POA: Insufficient documentation

## 2022-08-05 DIAGNOSIS — K08109 Complete loss of teeth, unspecified cause, unspecified class: Secondary | ICD-10-CM | POA: Diagnosis present

## 2022-08-05 DIAGNOSIS — C329 Malignant neoplasm of larynx, unspecified: Secondary | ICD-10-CM | POA: Diagnosis present

## 2022-08-05 DIAGNOSIS — Z930 Tracheostomy status: Secondary | ICD-10-CM | POA: Diagnosis not present

## 2022-08-05 DIAGNOSIS — M436 Torticollis: Secondary | ICD-10-CM | POA: Diagnosis present

## 2022-08-05 NOTE — Progress Notes (Signed)
Tracheostomy Procedure Note  David [name] [id number] 10-28-1975  Pre Procedure Tracheostomy Information  Trach Brand: Shiley Size:  4.0 [id number] Style: Uncuffed Secured by: Velcro   Procedure: Trach Change and Trach Cleaning    Post Procedure Tracheostomy Information  Trach Brand: Shiley Size:  4.0 [id number] Style: Uncuffed Secured by: Velcro   Post Procedure Evaluation:  ETCO2 positive color change from yellow to purple : Yes.   Vital signs:VSS Patients current condition: stable Complications: No apparent complications Trach site exam: clean, dry Wound care done: 4 x 4 gauze drain Patient did tolerate procedure well.   Education: none  Prescription needs: none    Additional needs:  New PMV given to patient at this visit

## 2022-08-05 NOTE — Telephone Encounter (Signed)
Scheduled appointment per 7/3 los. Talked with the patients mother and she is aware of the made appointment for the patient.

## 2022-08-05 NOTE — Progress Notes (Signed)
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 10/26 heme/onc f/u: ET/CT which was suggestive of possible residual disease but exploration and biopsy showed no evidence of malignancy which is very reassuring. Plan was to see onc in a year 11/9 out-pt SLP recommended he continue asp precautions and exercises  11/14 seen by ENT Procedure: Flexible fiberoptic laryngoscopy  "The right pyriform is effaced but no visible tumor seen. The epiglottis is edematous, as is the right aryepiglottic fold. I cannot see any visible ulcers or tumor of the endolarynx although visualization was difficult due to edema. Vocal fold mobility was not seen on the right. Airway very narrow due to the right arytenoid/AE fold edema" 2/13 seen by ENT. Ordered CT but did not see evidence of active disease  2/21 seen trach clinic. Holding off on moving towards decannulation as pt still had evidence of air trapping w/ trach occlusion.  4/18 presents for planned trach change. No sig new events since last visit. Feels like secretions better. Does report decreased neck ROM 5/2 seeing PT starting dry needling and weekly PT 6/6 Still working w/ PT. Nothing new noted from last rad/onc visit.  Last seen in PT end of June. Looks like making slow improvement w/ neck pain. Returns today 7/8 for planned trach change   Review of Systems  Constitutional: Negative.   HENT:         Neck pain and decreased neck ROM a little better  Respiratory:  Positive for cough.        Still has occ thick mucous   Cardiovascular:  Negative for chest pain.  Gastrointestinal:  Positive for heartburn.  Genitourinary: Negative.   Musculoskeletal: Negative.   Skin: Negative.     Exam   General frail 47 yr old chronically ill male. Presents w/ his mother today. He is in no distress HENT his neck is still contracted w/ ear towards right shoulder. His trach is mid-line. Has #4 cuffless trach.  Using PMV voice quality is hoarse but at baseline Pulm clear no accessory use. Room air Card rrr Abd soft Ext no edema   Procedure: the #4 trach was removed. Stoma was assessed and is mid-line and unremarkable . A new 4 trach was placed over obturator. Placement was confirmed via ETCO2. Tolerated well.     Principal Problem:   Tracheostomy status (HCC) Active Problems:   Laryngeal cancer (HCC)   Teeth missing   Torticollis  Tracheostomy status (HCC) Overview: Brand: shiley Size: 4 cuffless  Last change in clinic:7/8 , prior was  6/6  Discussion [name] is unremarkable. Still has sig air trapping w/ capping valve. I doubt he will ever be decannulated. Still has trouble w/ thick mucous. I encouraged him to increase fluid intake and consider Mucinex   Plan ROV 4 weeks Cont PMV as tolerated    My time 14 min    Simonne Martinet ACNP-BC [facility] Pulmonary/Critical Care Pager # [phone] OR # [phone] if no answer

## 2022-08-07 ENCOUNTER — Ambulatory Visit: Payer: MEDICAID | Attending: Nurse Practitioner

## 2022-08-07 DIAGNOSIS — M6281 Muscle weakness (generalized): Secondary | ICD-10-CM | POA: Insufficient documentation

## 2022-08-07 DIAGNOSIS — M542 Cervicalgia: Secondary | ICD-10-CM | POA: Insufficient documentation

## 2022-08-07 DIAGNOSIS — R293 Abnormal posture: Secondary | ICD-10-CM | POA: Diagnosis present

## 2022-08-07 NOTE — Therapy (Signed)
OUTPATIENT PHYSICAL THERAPY CERVICAL TREATMENT   Patient Name: David [name] MRN: [id number] DOB:1975/04/26, 47 y.o., male Today's Date: 08/07/2022  END OF SESSION:  PT End of Session - 08/07/22 1655     Visit Number 9    Date for PT Re-Evaluation 09/28/22    Authorization Type Medicaid CCME- 6 visits    Authorization - Visit Number 5    Authorization - Number of Visits 6    PT Start Time 1617    PT Stop Time 1654    PT Time Calculation (min) 37 min    Activity Tolerance Patient tolerated treatment well    Behavior During Therapy WFL for tasks assessed/performed                     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)    Pneumococcal bacteremia 06/09/2021   Pneumothorax    Left lung spontaneous pneumothorax at age 72 yr    Past Surgical History:  Procedure Laterality Date   chest tubes Left    IR GASTROSTOMY TUBE MOD SED  06/18/2021   LAPAROSCOPIC INSERTION GASTROSTOMY TUBE N/A 06/20/2021   Procedure: LAPAROSCOPIC INSERTION GASTROSTOMY TUBE;  Surgeon: Berna Bue, MD;  Location: [facility] OR;  Service: General;  Laterality: N/A;  DOW PATIENT   TRACHEOSTOMY TUBE PLACEMENT N/A 06/09/2021   Procedure: AWAKE TRACHEOSTOMY;  Surgeon: Christia Reading, MD;  Location: [facility] OR;  Service: ENT;  Laterality: N/A;   Patient Active Problem List   Diagnosis Date Noted   Torticollis 07/04/2022   Evaluation by medical service required 03/19/2022   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   Defective dental restoration 12/31/2021   Chronic periodontitis 12/31/2021    Impacted third molar tooth 12/31/2021   Irritant contact dermatitis associated with digestive stoma 12/25/2021   Irritation around percutaneous endoscopic gastrostomy (PEG) tube site (HCC) 12/25/2021   Leaking PEG tube (HCC) 12/12/2021   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   Continuous tobacco abuse 11/21/2021   Dermatitis associated with moisture 10/16/2021   Back pain 08/16/2021   Candidal endocarditis    History of radiation to head and neck region 07/11/2021   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   Dysgeusia 07/11/2021   S/P percutaneous endoscopic gastrostomy (PEG) tube placement (HCC) 06/24/2021   Pressure injury of skin 06/17/2021   Tracheostomy status (HCC)    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   Heart failure with reduced ejection fraction (HCC) 04/28/2021   Elevated troponin 04/01/2021   Elevated transaminase level 04/01/2021   Weight loss, unintentional 01/15/2021   Protein-calorie malnutrition, severe (HCC) 01/10/2021   Hypotension 01/08/2021   AKI (acute kidney injury) (HCC) 01/08/2021   Chemotherapy induced nausea and vomiting 01/01/2021   Encounter for dental examination 12/13/2020   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   Teeth missing 12/04/2020   Radiation caries 12/04/2020   Retained tooth root 12/04/2020   Chronic apical periodontitis 12/04/2020   Accretions on teeth 12/04/2020    PCP: Adron Bene, MD   REFERRING PROVIDER: Glee Arvin, NP   REFERRING DIAG: Diagnosis Information  Diagnosis  C32.1 (ICD-10-CM) - Malignant neoplasm of supraglottis (  HCC)  Z51.5 (ICD-10-CM) - Palliative care patient  Eval and treat for neck pain   THERAPY DIAG:  Cervicalgia - Plan: PT plan of care cert/re-cert  Abnormal posture - Plan: PT plan of care cert/re-cert  Muscle weakness (generalized) - Plan: PT plan of care cert/re-cert  Rationale for Evaluation and Treatment: Rehabilitation  ONSET DATE: 1 year history (trach placement)  SUBJECTIVE:                                                                                                                                                                                                          SUBJECTIVE STATEMENT: I am using my collar every other day.  I can hold my neck upright much better.  I can see the horizon and I don't feel as vulnerable when I am walking outside.  I'm 35-50% better.   Hand dominance: Right  PERTINENT HISTORY:  Tracheostomy, radiation to neck, malignant neoplasm of supraglottis  PAIN:  Are you having pain? Yes: NPRS scale: 0/10 Pain location: Lt>Rt neck and thoracic spine  Pain description: stiff, throbbing Aggravating factors: overstretching Relieving factors: not moving   PRECAUTIONS: Other: cancer, trach  WEIGHT BEARING RESTRICTIONS: No  FALLS:  Has patient fallen in last 6 months? No  LIVING ENVIRONMENT: Lives with: lives alone Lives in: House/apartment   PLOF: Independent, walking to mailbox  PATIENT GOALS: reduce neck pain, improve mobility   NEXT MD VISIT:   OBJECTIVE:   DIAGNOSTIC FINDINGS:  None   PATIENT SURVEYS:  5/30 NDI: 12/45 NDI 11/45  COGNITION: Overall cognitive status: Within functional limits for tasks assessed  SENSATION: WFL  POSTURE: rounded shoulders, forward head, increased thoracic kyphosis, and flexed trunk   PALPATION: 5/30:  Significant adaptive shortening of Rt SCM, lengthened enough now for Pt to be able to achieve neutral posture from resting in flexion/Rt SB/Lt Rot Over-lengthened and painful Lt cervical paraspinals and upper trap secondary to tone in Rt SCM Very tender SCM beneath jaw   Significant tension and reduced muscle length in bil neck. Pain on Rt vs Lt.    CERVICAL ROM:   Active ROM A/PROM (deg) eval A/ROM A/ROM 08/07/22  Flexion Rests in flexion Rests in  55 deg flexion, Rt SB, Lt Rot:  Able to  Right posture to 15 deg flexion Rests in 30 degrees flexion  Extension  Rests in flexion,  Rt SB, Lt Rot: Able to correct to 15 deg flexion    Right lateral flexion Rests in 30 degrees   Rests in 20 degrees sidebending  Left lateral flexion  10 degrees Rt lateral flexion     Right rotation WFLs From 15 deg flexion, 45  deg  50 degrees- rests in flexion  Left rotation WFLs From 15 deg flexion, 45 deg  70 degrees left- rest in flexion    (Blank rows = not tested)  UPPER EXTREMITY ROM:  UE A/ROM is WFLs without pain  UPPER EXTREMITY MMT:  5/30 Neck strength: able to perform postural correction to near neutral and hold 36 sec x 1 reps  4+/5 bil UE strength, neck strength 4-/5 throughout   TODAY'S TREATMENT:      08/07/22 Discussed cervical traction unit on [employer] to assist with upright posture- pt's mom will order to assist with posture training.  Manual:  supine with passive stretch and hold into Lt sidebending and rotation to pt tolerance.              Trigger Point Dry-Needling  Treatment instructions: Expect mild to moderate muscle soreness. S/S of pneumothorax if dry needled over a lung field, and to seek immediate medical attention should they occur. Patient verbalized understanding of these instructions and education.  Patient Consent Given: Yes Education handout provided: Previously provided Muscles treated: bil cervical multifidi, Rt cervical paraspinals and bil upper traps Treatment response/outcome: Utilized skilled palpation to identify trigger points.  During dry needling able to palpate muscle twitch and muscle elongation  Elongation to bil neck with pt prone Skilled palpation and monitoring by PT during dry needling  07/24/22: Supine on 2 pillows- Lt rotation and sidebending, resting in neutral posture and hold Manual: elongation to Rt SCM and upper traps in Lt sidelying, then in supine with passive stretch and hold into Lt sidebending and rotation to pt tolerance.              DATE:  07/17/22: Lt sidelying gravity assisted stretching multiple angles  Lt  sidelying isometrics Lt SB, Rt Rot Lt sidelying with PT manually supporting head position STM and cervical joint mobs along facets and U joints Gr I-III Elongation to Rt SCM and upper trap in Lt sidelying                                                                                                                       PATIENT EDUCATION:  Education details: Access Code: [email or web address] Person educated: Patient Education method: Explanation, Facilities manager, and Handouts Education comprehension: verbalized understanding and returned demonstration  HOME EXERCISE PROGRAM: Access Code: [email or web address] URL: [email or web address] Date: 06/19/2022 Prepared by: Loistine Simas Beuhring  Exercises - Seated Scapular Retraction  - 5 x daily - 7 x weekly - 1 sets - 10 reps - 5 hold - Supine Scapular Retraction  - 3 x daily - 7 x weekly - 1 sets - 10 reps - 5 hold - Seated Correct Posture  - 1 x daily - 7 x weekly - 3 sets - 10 reps - Seated Cervical Retraction  - 1 x daily - 7 x weekly - 3 sets - 10 reps - Supine  Chin Tuck  - 1 x daily - 7 x weekly - 3 sets - 10 reps - Seated Cervical Sidebending AROM  - 3 x daily - 7 x weekly - 1 sets - 10 reps - 5-10 hold - Seated Assisted Cervical Rotation with Towel  - 5 x daily - 7 x weekly - 1 sets - 3 reps - 10 hold  ASSESSMENT:  CLINICAL IMPRESSION: Pt has purchased an inflatable collar and is wearing at night for passive stretch. Pt continues to be limited by tight musculature on the Rt side of his neck.  His cervical A/ROM is overall significantly improved and pt is able to maintain upright head posture x 2 minutes with Rt cervical sidebending that is also improved.   Pt was able to obtain neutral head posture in supine with passive motion and soft tissue elongation to musculature.  Pt had good response to DN to bil neck to release bil muscle tension. He reports 35-50% overall improvement and is now able to see the horizon when he is outside, making him feel less  vulnerable in the community.   Slow progress due to chronic nature of condition.  Pt does report lower pain levels and improved head alignment at home. Patient will benefit from skilled PT to address the below impairments and improve overall function.   Pt is motivated to continue with PT and will benefit from continued skilled PT for manual techniques to complement his HEP to improve muscle tightness, ROM and strength to improve QOL.  He stated he feels like he has more purpose by way of his HEP and even expressed interest in finding a hobby to do with his hands now that he is moving better such as building models or painting/drawing.   OBJECTIVE IMPAIRMENTS: decreased activity tolerance, increased muscle spasms, impaired flexibility, postural dysfunction, and pain.   ACTIVITY LIMITATIONS: sitting, standing, and reach over head  PARTICIPATION LIMITATIONS: meal prep, driving, and community activity  PERSONAL FACTORS: 1-2 comorbidities: cancer on pallative care, chronic postural dysfunction  are also affecting patient's functional outcome.   REHAB POTENTIAL: Good  CLINICAL DECISION MAKING: Stable/uncomplicated  EVALUATION COMPLEXITY: Moderate   GOALS: Goals reviewed with patient? Yes  SHORT TERM GOALS: Target date: 06/27/2022    Be independent in initial  HEP Baseline:  Goal status: met 5/30  2.  Verbalize and demonstrate postural corrections to improve tolerance for neutral posture and reduced neck strain  Baseline:  Goal status: met 5/30   LONG TERM GOALS: Target date: 09/27/22  Be independent in advanced HEP Baseline:  Goal status: ongoing  3.  Improve NDI to < or = to 7/45 Baseline: 11/45 Goal status: 12/45  3.   Perform independent postural correction and hold eyes fixed forward x 3-4 minutes   Baseline: 2 minutes (08/07/22) Goal status:revised    4.   Report > or = to 60-70% reduction in neck pain due to reduced tension and strain in neck Baseline: 35-50%  Goal  status:  revised   5. Rest i < or = to 10-15 degrees Rt sidebending x 3 minutes   Baseline: 20 degrees   Goal Status: NEW    PLAN:  PT FREQUENCY: 1x/week  PT DURATION: 6 weeks  PLANNED INTERVENTIONS: Therapeutic exercises, Therapeutic activity, Neuromuscular re-education, Balance training, Gait training, Patient/Family education, Self Care, Joint mobilization, Aquatic Therapy, Dry Needling, Electrical stimulation, Spinal mobilization, Cryotherapy, Moist heat, Taping, Traction, Manual therapy, and Re-evaluation  PLAN FOR NEXT SESSION:  Lt SL gravity assisted stretching  and manual techniques, request more visits,  neutral head in supine and work on postural strength with band, DN again if helpful  Lorrene Reid, PT 08/07/22 5:08 PM     [facility] [address], [address] [place], [place] [address] Phone # [phone] Fax [phone]

## 2022-08-12 ENCOUNTER — Encounter: Payer: Self-pay | Admitting: Physical Therapy

## 2022-08-12 ENCOUNTER — Ambulatory Visit: Payer: MEDICAID | Admitting: Physical Therapy

## 2022-08-12 DIAGNOSIS — R293 Abnormal posture: Secondary | ICD-10-CM

## 2022-08-12 DIAGNOSIS — M6281 Muscle weakness (generalized): Secondary | ICD-10-CM

## 2022-08-12 DIAGNOSIS — M542 Cervicalgia: Secondary | ICD-10-CM

## 2022-08-12 NOTE — Therapy (Signed)
OUTPATIENT PHYSICAL THERAPY CERVICAL TREATMENT   Patient Name: David [name] MRN: [id number] DOB:[birth year], 47 y.o., male Today's Date: 08/12/2022  END OF SESSION:  PT End of Session - 08/12/22 1353     Visit Number 10   Pt arrived but not seen for visit 10 on 7/15 due to Pt not feeling well.  Next visit will be visit 10   Date for PT Re-Evaluation 09/28/22    Authorization Type new insurance: [place] [employer] authorized 8 visits 08/07/22-09/28/22    Authorization - Visit Number 2   Pt arrived but not treated for visit #2 on 7/15, so next visit will be visit 2   Authorization - Number of Visits 8    PT Start Time 1400    PT Stop Time 1410    PT Time Calculation (min) 10 min    Activity Tolerance Patient tolerated treatment well    Behavior During Therapy WFL for tasks assessed/performed                     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)    Pneumococcal bacteremia 06/09/2021   Pneumothorax    Left lung spontaneous pneumothorax at age 11 yr    Past Surgical History:  Procedure Laterality Date   chest tubes Left    IR GASTROSTOMY TUBE MOD SED  06/18/2021   LAPAROSCOPIC INSERTION GASTROSTOMY TUBE N/A 06/20/2021   Procedure: LAPAROSCOPIC INSERTION GASTROSTOMY TUBE;  Surgeon: Berna Bue, MD;  Location: [facility] OR;  Service: General;  Laterality: N/A;  DOW PATIENT   TRACHEOSTOMY TUBE PLACEMENT N/A 06/09/2021   Procedure: AWAKE TRACHEOSTOMY;  Surgeon: Christia Reading, MD;  Location: [facility] OR;  Service: ENT;  Laterality: N/A;   Patient Active Problem List   Diagnosis Date Noted   Torticollis 07/04/2022    Evaluation by medical service required 03/19/2022   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   Defective dental restoration 12/31/2021   Chronic periodontitis 12/31/2021   Impacted third molar tooth 12/31/2021   Irritant contact dermatitis associated with digestive stoma 12/25/2021   Irritation around percutaneous endoscopic gastrostomy (PEG) tube site (HCC) 12/25/2021   Leaking PEG tube (HCC) 12/12/2021   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   Continuous tobacco abuse 11/21/2021   Dermatitis associated with moisture 10/16/2021   Back pain 08/16/2021   Candidal endocarditis    History of radiation to head and neck region 07/11/2021   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   Dysgeusia 07/11/2021   S/P percutaneous endoscopic gastrostomy (PEG) tube placement (HCC) 06/24/2021   Pressure injury of skin 06/17/2021   Tracheostomy status (HCC)    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   Heart failure with reduced ejection fraction (HCC) 04/28/2021   Elevated troponin 04/01/2021   Elevated transaminase level 04/01/2021   Weight loss, unintentional 01/15/2021   Protein-calorie malnutrition, severe (HCC) 01/10/2021   Hypotension 01/08/2021   AKI (acute kidney injury) (HCC) 01/08/2021   Chemotherapy induced nausea and vomiting 01/01/2021   Encounter for dental examination 12/13/2020   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   Teeth missing 12/04/2020   Radiation caries 12/04/2020   Retained  tooth root 12/04/2020   Chronic apical periodontitis 12/04/2020   Accretions on teeth 12/04/2020    PCP: Adron Bene, MD   REFERRING PROVIDER: Glee Arvin, NP   REFERRING DIAG: Diagnosis Information  Diagnosis  C32.1 (ICD-10-CM) - Malignant neoplasm of supraglottis (HCC)  Z51.5 (ICD-10-CM) - Palliative care patient  Eval and treat for neck pain   THERAPY DIAG:  Cervicalgia  Abnormal posture  Muscle weakness (generalized)  Rationale  for Evaluation and Treatment: Rehabilitation  ONSET DATE: 1 year history (trach placement)  SUBJECTIVE:                                                                                                                                                                                                         SUBJECTIVE STATEMENT: I had a bad night and it's been hard to breathe.  My trach feels swollen.  I don't want to do too much today.  The over the door neck pulley traction is working well.  I wear the inflatable neck brace every other night.   I am not sure if the DN did anything for me.   Hand dominance: Right  PERTINENT HISTORY:  Tracheostomy, radiation to neck, malignant neoplasm of supraglottis  PAIN:  Are you having pain? Yes: NPRS scale: 0/10 Pain location: Lt>Rt neck and thoracic spine  Pain description: stiff, throbbing Aggravating factors: overstretching Relieving factors: not moving   PRECAUTIONS: Other: cancer, trach  WEIGHT BEARING RESTRICTIONS: No  FALLS:  Has patient fallen in last 6 months? No  LIVING ENVIRONMENT: Lives with: lives alone Lives in: House/apartment   PLOF: Independent, walking to mailbox  PATIENT GOALS: reduce neck pain, improve mobility   NEXT MD VISIT:   OBJECTIVE:   DIAGNOSTIC FINDINGS:  None   PATIENT SURVEYS:  5/30 NDI: 12/45 NDI 11/45  COGNITION: Overall cognitive status: Within functional limits for tasks assessed  SENSATION: WFL  POSTURE: rounded shoulders, forward head, increased thoracic kyphosis, and flexed trunk   PALPATION: 5/30:  Significant adaptive shortening of Rt SCM, lengthened enough now for Pt to be able to achieve neutral posture from resting in flexion/Rt SB/Lt Rot Over-lengthened and painful Lt cervical paraspinals and upper trap secondary to tone in Rt SCM Very tender SCM beneath jaw   Significant tension and reduced muscle length in bil neck. Pain on Rt vs Lt.    CERVICAL ROM:   Active ROM A/PROM  (deg) eval A/ROM A/ROM 08/07/22  Flexion Rests in flexion Rests in  55 deg flexion, Rt SB, Lt Rot:  Able to  Right posture to  15 deg flexion Rests in 30 degrees flexion  Extension  Rests in flexion, Rt SB, Lt Rot: Able to correct to 15 deg flexion    Right lateral flexion Rests in 30 degrees   Rests in 20 degrees sidebending  Left lateral flexion 10 degrees Rt lateral flexion     Right rotation WFLs From 15 deg flexion, 45  deg  50 degrees- rests in flexion  Left rotation WFLs From 15 deg flexion, 45 deg  70 degrees left- rest in flexion    (Blank rows = not tested)  UPPER EXTREMITY ROM:  UE A/ROM is WFLs without pain  UPPER EXTREMITY MMT:  5/30 Neck strength: able to perform postural correction to near neutral and hold 36 sec x 1 reps  4+/5 bil UE strength, neck strength 4-/5 throughout   TODAY'S TREATMENT:      08/12/22: Attempted positions to begin treatment and ultimately Pt stated he didn't feel he could do therapy today due to having challenges with trach and feeling like he couldn't breathe.  No treatment today.  08/07/22 Discussed cervical traction unit on [employer] to assist with upright posture- pt's mom will order to assist with posture training.  Manual:  supine with passive stretch and hold into Lt sidebending and rotation to pt tolerance.              Trigger Point Dry-Needling  Treatment instructions: Expect mild to moderate muscle soreness. S/S of pneumothorax if dry needled over a lung field, and to seek immediate medical attention should they occur. Patient verbalized understanding of these instructions and education.  Patient Consent Given: Yes Education handout provided: Previously provided Muscles treated: bil cervical multifidi, Rt cervical paraspinals and bil upper traps Treatment response/outcome: Utilized skilled palpation to identify trigger points.  During dry needling able to palpate muscle twitch and muscle elongation  Elongation to bil neck with pt  prone Skilled palpation and monitoring by PT during dry needling  07/24/22: Supine on 2 pillows- Lt rotation and sidebending, resting in neutral posture and hold Manual: elongation to Rt SCM and upper traps in Lt sidelying, then in supine with passive stretch and hold into Lt sidebending and rotation to pt tolerance.              DATE:  07/17/22: Lt sidelying gravity assisted stretching multiple angles  Lt sidelying isometrics Lt SB, Rt Rot Lt sidelying with PT manually supporting head position STM and cervical joint mobs along facets and U joints Gr I-III Elongation to Rt SCM and upper trap in Lt sidelying                                                                                                                       PATIENT EDUCATION:  Education details: Access Code: [email or web address] Person educated: Patient Education method: Explanation, Facilities manager, and Handouts Education comprehension: verbalized understanding and returned demonstration  HOME EXERCISE PROGRAM: Access Code: [email or web address] URL: [email or web address] Date: 06/19/2022 Prepared by: Morton Peters  Exercises - Seated Scapular  Retraction  - 5 x daily - 7 x weekly - 1 sets - 10 reps - 5 hold - Supine Scapular Retraction  - 3 x daily - 7 x weekly - 1 sets - 10 reps - 5 hold - Seated Correct Posture  - 1 x daily - 7 x weekly - 3 sets - 10 reps - Seated Cervical Retraction  - 1 x daily - 7 x weekly - 3 sets - 10 reps - Supine Chin Tuck  - 1 x daily - 7 x weekly - 3 sets - 10 reps - Seated Cervical Sidebending AROM  - 3 x daily - 7 x weekly - 1 sets - 10 reps - 5-10 hold - Seated Assisted Cervical Rotation with Towel  - 5 x daily - 7 x weekly - 1 sets - 3 reps - 10 hold  ASSESSMENT:  CLINICAL IMPRESSION: Pt arrived reporting no sleep last night and having a bad day with his trach feeling like he couldn't breathe well.  PT attempted positioning to begin treatment but ultimately Pt declined participation today.  Not  treatment administered today.  Pt is motivated to continue with PT and will benefit from continued skilled PT for manual techniques to complement his HEP to improve muscle tightness, ROM and strength to improve QOL.  He stated he feels like he has more purpose by way of his HEP and even expressed interest in finding a hobby to do with his hands now that he is moving better such as building models or painting/drawing.   OBJECTIVE IMPAIRMENTS: decreased activity tolerance, increased muscle spasms, impaired flexibility, postural dysfunction, and pain.   ACTIVITY LIMITATIONS: sitting, standing, and reach over head  PARTICIPATION LIMITATIONS: meal prep, driving, and community activity  PERSONAL FACTORS: 1-2 comorbidities: cancer on pallative care, chronic postural dysfunction  are also affecting patient's functional outcome.   REHAB POTENTIAL: Good  CLINICAL DECISION MAKING: Stable/uncomplicated  EVALUATION COMPLEXITY: Moderate   GOALS: Goals reviewed with patient? Yes  SHORT TERM GOALS: Target date: 06/27/2022    Be independent in initial  HEP Baseline:  Goal status: met 5/30  2.  Verbalize and demonstrate postural corrections to improve tolerance for neutral posture and reduced neck strain  Baseline:  Goal status: met 5/30   LONG TERM GOALS: Target date: 09/27/22  Be independent in advanced HEP Baseline:  Goal status: ongoing  3.  Improve NDI to < or = to 7/45 Baseline: 11/45 Goal status: 12/45  3.   Perform independent postural correction and hold eyes fixed forward x 3-4 minutes   Baseline: 2 minutes (08/07/22) Goal status:revised    4.   Report > or = to 60-70% reduction in neck pain due to reduced tension and strain in neck Baseline: 35-50%  Goal status:  revised   5. Rest i < or = to 10-15 degrees Rt sidebending x 3 minutes   Baseline: 20 degrees   Goal Status: NEW    PLAN:  PT FREQUENCY: 1x/week  PT DURATION: 6 weeks  PLANNED INTERVENTIONS: Therapeutic  exercises, Therapeutic activity, Neuromuscular re-education, Balance training, Gait training, Patient/Family education, Self Care, Joint mobilization, Aquatic Therapy, Dry Needling, Electrical stimulation, Spinal mobilization, Cryotherapy, Moist heat, Taping, Traction, Manual therapy, and Re-evaluation  PLAN FOR NEXT SESSION:  Lt SL gravity assisted stretching and manual techniques, request more visits,  neutral head in supine and work on postural strength with band, DN again if helpful  [employer], PT 08/12/22 2:20 PM     [facility]  [address] [place], [place] [address] Phone # [phone] Fax [phone]

## 2022-08-16 ENCOUNTER — Other Ambulatory Visit: Payer: Self-pay | Admitting: Nurse Practitioner

## 2022-08-16 DIAGNOSIS — C329 Malignant neoplasm of larynx, unspecified: Secondary | ICD-10-CM

## 2022-08-16 DIAGNOSIS — Z515 Encounter for palliative care: Secondary | ICD-10-CM

## 2022-08-16 DIAGNOSIS — G893 Neoplasm related pain (acute) (chronic): Secondary | ICD-10-CM

## 2022-08-16 DIAGNOSIS — C328 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: Secondary | ICD-10-CM

## 2022-08-16 MED ORDER — LIDOCAINE 5 % EX PTCH
1.0000 | MEDICATED_PATCH | CUTANEOUS | 6 refills | Status: DC
Start: 2022-08-16 — End: 2022-09-06

## 2022-08-16 MED ORDER — LIDOCAINE VISCOUS HCL 2 % MT SOLN
OROMUCOSAL | 5 refills | Status: DC
Start: 2022-08-16 — End: 2022-09-12

## 2022-08-21 ENCOUNTER — Ambulatory Visit: Payer: MEDICAID

## 2022-08-21 DIAGNOSIS — R293 Abnormal posture: Secondary | ICD-10-CM

## 2022-08-21 DIAGNOSIS — M542 Cervicalgia: Secondary | ICD-10-CM

## 2022-08-21 DIAGNOSIS — M6281 Muscle weakness (generalized): Secondary | ICD-10-CM

## 2022-08-21 NOTE — Therapy (Signed)
OUTPATIENT PHYSICAL THERAPY CERVICAL TREATMENT   Patient Name: David [name] MRN: [id number] DOB:Oct 12, 1975, 47 y.o., male Today's Date: 08/21/2022  END OF SESSION:  PT End of Session - 08/21/22 1306     Visit Number 10    Date for PT Re-Evaluation 09/28/22    Authorization Type new insurance: [place] [employer] authorized 8 visits 08/07/22-09/28/22    Authorization - Visit Number 2    Authorization - Number of Visits 8    PT Start Time 1232    PT Stop Time 1301    PT Time Calculation (min) 29 min    Activity Tolerance Patient tolerated treatment well    Behavior During Therapy WFL for tasks assessed/performed                      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)    Pneumococcal bacteremia 06/09/2021   Pneumothorax    Left lung spontaneous pneumothorax at age 41 yr    Past Surgical History:  Procedure Laterality Date   chest tubes Left    IR GASTROSTOMY TUBE MOD SED  06/18/2021   LAPAROSCOPIC INSERTION GASTROSTOMY TUBE N/A 06/20/2021   Procedure: LAPAROSCOPIC INSERTION GASTROSTOMY TUBE;  Surgeon: Berna Bue, MD;  Location: [facility] OR;  Service: General;  Laterality: N/A;  DOW PATIENT   TRACHEOSTOMY TUBE PLACEMENT N/A 06/09/2021   Procedure: AWAKE TRACHEOSTOMY;  Surgeon: Christia Reading, MD;  Location: [facility] OR;  Service: ENT;  Laterality: N/A;   Patient Active Problem List   Diagnosis Date Noted   Torticollis 07/04/2022   Evaluation by medical service required 03/19/2022   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   Defective dental restoration  12/31/2021   Chronic periodontitis 12/31/2021   Impacted third molar tooth 12/31/2021   Irritant contact dermatitis associated with digestive stoma 12/25/2021   Irritation around percutaneous endoscopic gastrostomy (PEG) tube site (HCC) 12/25/2021   Leaking PEG tube (HCC) 12/12/2021   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   Continuous tobacco abuse 11/21/2021   Dermatitis associated with moisture 10/16/2021   Back pain 08/16/2021   Candidal endocarditis    History of radiation to head and neck region 07/11/2021   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   Dysgeusia 07/11/2021   S/P percutaneous endoscopic gastrostomy (PEG) tube placement (HCC) 06/24/2021   Pressure injury of skin 06/17/2021   Tracheostomy status (HCC)    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   Heart failure with reduced ejection fraction (HCC) 04/28/2021   Elevated troponin 04/01/2021   Elevated transaminase level 04/01/2021   Weight loss, unintentional 01/15/2021   Protein-calorie malnutrition, severe (HCC) 01/10/2021   Hypotension 01/08/2021   AKI (acute kidney injury) (HCC) 01/08/2021   Chemotherapy induced nausea and vomiting 01/01/2021   Encounter for dental examination 12/13/2020   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   Teeth missing 12/04/2020   Radiation caries 12/04/2020   Retained tooth root 12/04/2020   Chronic apical periodontitis 12/04/2020   Accretions on teeth 12/04/2020    PCP: Adron Bene, MD   REFERRING PROVIDER: Glee Arvin, NP   REFERRING DIAG: Diagnosis Information  Diagnosis  C32.1 (ICD-10-CM) -  Malignant neoplasm of supraglottis (HCC)  Z51.5 (ICD-10-CM) - Palliative care patient  Eval and treat for neck pain   THERAPY DIAG:  Cervicalgia  Abnormal posture  Muscle weakness (generalized)  Rationale for Evaluation and Treatment: Rehabilitation  ONSET DATE: 1 year history (trach placement)  SUBJECTIVE:                                                                                                                                                                                                          SUBJECTIVE STATEMENT: I am feeling better than last time I was here. I am trying to get medication to help my anxiety.  I'm using the over the door traction to work on holding my head up.    Hand dominance: Right  PERTINENT HISTORY:  Tracheostomy, radiation to neck, malignant neoplasm of supraglottis  PAIN:  Are you having pain? Yes: NPRS scale: 0/10 Pain location: Lt>Rt neck and thoracic spine  Pain description: stiff, throbbing Aggravating factors: overstretching Relieving factors: not moving   PRECAUTIONS: Other: cancer, trach  WEIGHT BEARING RESTRICTIONS: No  FALLS:  Has patient fallen in last 6 months? No  LIVING ENVIRONMENT: Lives with: lives alone Lives in: House/apartment   PLOF: Independent, walking to mailbox  PATIENT GOALS: reduce neck pain, improve mobility   NEXT MD VISIT:   OBJECTIVE:   DIAGNOSTIC FINDINGS:  None   PATIENT SURVEYS:  5/30 NDI: 12/45 NDI 11/45  COGNITION: Overall cognitive status: Within functional limits for tasks assessed  SENSATION: WFL  POSTURE: rounded shoulders, forward head, increased thoracic kyphosis, and flexed trunk   PALPATION: 5/30:  Significant adaptive shortening of Rt SCM, lengthened enough now for Pt to be able to achieve neutral posture from resting in flexion/Rt SB/Lt Rot Over-lengthened and painful Lt cervical paraspinals and upper trap secondary to tone in Rt SCM Very tender SCM beneath jaw   Significant tension and reduced muscle length in bil neck. Pain on Rt vs Lt.    CERVICAL ROM:   Active ROM A/PROM (deg) eval A/ROM A/ROM 08/07/22  Flexion Rests in flexion Rests in  55 deg flexion, Rt SB, Lt Rot:  Able to  Right posture to 15 deg flexion Rests in 30 degrees flexion  Extension  Rests in flexion, Rt SB, Lt Rot: Able to correct to 15 deg flexion    Right lateral  flexion Rests in 30 degrees   Rests in 20 degrees sidebending  Left lateral flexion 10 degrees Rt lateral flexion     Right rotation WFLs From 15 deg flexion, 45  deg  50 degrees- rests in flexion  Left rotation WFLs From 15 deg flexion, 45 deg  70 degrees left- rest in flexion    (Blank rows = not tested)  UPPER EXTREMITY ROM:  UE A/ROM is WFLs without pain  UPPER EXTREMITY MMT:  5/30 Neck strength: able to perform postural correction to near neutral and hold 36 sec x 1 reps  4+/5 bil UE strength, neck strength 4-/5 throughout   TODAY'S TREATMENT:    08/21/22: Elongation and passive stretch to Rt neck in supine with P/ROM and overpressure Sidelying on Lt- elongation to Rt cervical musculature.   08/12/22: Attempted positions to begin treatment and ultimately Pt stated he didn't feel he could do therapy today due to having challenges with trach and feeling like he couldn't breathe.  No treatment today.  08/07/22 Discussed cervical traction unit on [employer] to assist with upright posture- pt's mom will order to assist with posture training.  Manual:  supine with passive stretch and hold into Lt sidebending and rotation to pt tolerance.              Trigger Point Dry-Needling  Treatment instructions: Expect mild to moderate muscle soreness. S/S of pneumothorax if dry needled over a lung field, and to seek immediate medical attention should they occur. Patient verbalized understanding of these instructions and education.  Patient Consent Given: Yes Education handout provided: Previously provided Muscles treated: bil cervical multifidi, Rt cervical paraspinals and bil upper traps Treatment response/outcome: Utilized skilled palpation to identify trigger points.  During dry needling able to palpate muscle twitch and muscle elongation  Elongation to bil neck with pt prone Skilled palpation and monitoring by PT during dry needling  07/24/22: Supine on 2 pillows- Lt rotation and  sidebending, resting in neutral posture and hold Manual: elongation to Rt SCM and upper traps in Lt sidelying, then in supine with passive stretch and hold into Lt sidebending and rotation to pt tolerance.              DATE:  07/17/22: Lt sidelying gravity assisted stretching multiple angles  Lt sidelying isometrics Lt SB, Rt Rot Lt sidelying with PT manually supporting head position STM and cervical joint mobs along facets and U joints Gr I-III Elongation to Rt SCM and upper trap in Lt sidelying                                                                                                                       PATIENT EDUCATION:  Education details: Access Code: [email or web address] Person educated: Patient Education method: Explanation, Facilities manager, and Handouts Education comprehension: verbalized understanding and returned demonstration  HOME EXERCISE PROGRAM: Access Code: [email or web address] URL: [email or web address] Date: 06/19/2022 Prepared by: Morton Peters  Exercises - Seated Scapular Retraction  - 5 x daily - 7 x weekly - 1 sets - 10 reps - 5 hold - Supine Scapular Retraction  - 3 x daily - 7 x weekly - 1 sets - 10 reps - 5 hold - Seated Correct  Posture  - 1 x daily - 7 x weekly - 3 sets - 10 reps - Seated Cervical Retraction  - 1 x daily - 7 x weekly - 3 sets - 10 reps - Supine Chin Tuck  - 1 x daily - 7 x weekly - 3 sets - 10 reps - Seated Cervical Sidebending AROM  - 3 x daily - 7 x weekly - 1 sets - 10 reps - 5-10 hold - Seated Assisted Cervical Rotation with Towel  - 5 x daily - 7 x weekly - 1 sets - 3 reps - 10 hold  ASSESSMENT:  CLINICAL IMPRESSION: Pt responded well to manual therapy today and there was improved tissue mobility of the Rt upper trap and improved passive motion to the left post session.  Pt continues to use his home cervical traction unit for postural training in sitting and neck brace for passive stretch.    Pt is motivated to continue with PT and will  benefit from continued skilled PT for manual techniques to complement his HEP to improve muscle tightness, ROM and strength to improve QOL.  He stated he feels like he has more purpose by way of his HEP and even expressed interest in finding a hobby to do with his hands now that he is moving better such as building models or painting/drawing.   OBJECTIVE IMPAIRMENTS: decreased activity tolerance, increased muscle spasms, impaired flexibility, postural dysfunction, and pain.   ACTIVITY LIMITATIONS: sitting, standing, and reach over head  PARTICIPATION LIMITATIONS: meal prep, driving, and community activity  PERSONAL FACTORS: 1-2 comorbidities: cancer on pallative care, chronic postural dysfunction  are also affecting patient's functional outcome.   REHAB POTENTIAL: Good  CLINICAL DECISION MAKING: Stable/uncomplicated  EVALUATION COMPLEXITY: Moderate   GOALS: Goals reviewed with patient? Yes  SHORT TERM GOALS: Target date: 06/27/2022    Be independent in initial  HEP Baseline:  Goal status: met 5/30  2.  Verbalize and demonstrate postural corrections to improve tolerance for neutral posture and reduced neck strain  Baseline:  Goal status: met 5/30   LONG TERM GOALS: Target date: 09/27/22  Be independent in advanced HEP Baseline:  Goal status: ongoing  3.  Improve NDI to < or = to 7/45 Baseline: 11/45 Goal status: 12/45  3.   Perform independent postural correction and hold eyes fixed forward x 3-4 minutes   Baseline: 2 minutes (08/07/22) Goal status:revised    4.   Report > or = to 60-70% reduction in neck pain due to reduced tension and strain in neck Baseline: 35-50%  Goal status:  revised   5. Rest i < or = to 10-15 degrees Rt sidebending x 3 minutes   Baseline: 20 degrees   Goal Status: NEW    PLAN:  PT FREQUENCY: 1x/week  PT DURATION: 6 weeks  PLANNED INTERVENTIONS: Therapeutic exercises, Therapeutic activity, Neuromuscular re-education, Balance  training, Gait training, Patient/Family education, Self Care, Joint mobilization, Aquatic Therapy, Dry Needling, Electrical stimulation, Spinal mobilization, Cryotherapy, Moist heat, Taping, Traction, Manual therapy, and Re-evaluation  PLAN FOR NEXT SESSION:  Lt SL gravity assisted stretching and manual techniques, request more visits,  neutral head in supine and work on postural strength with band, DN again if helpful  Lorrene Reid, PT 08/21/22 1:13 PM      [facility] [address] [place], [place] [address] Phone # [phone] Fax [phone]

## 2022-09-02 ENCOUNTER — Ambulatory Visit: Payer: MEDICAID | Attending: Nurse Practitioner | Admitting: Physical Therapy

## 2022-09-02 ENCOUNTER — Encounter: Payer: Self-pay | Admitting: Physical Therapy

## 2022-09-02 DIAGNOSIS — R293 Abnormal posture: Secondary | ICD-10-CM | POA: Diagnosis present

## 2022-09-02 DIAGNOSIS — M6281 Muscle weakness (generalized): Secondary | ICD-10-CM | POA: Diagnosis present

## 2022-09-02 DIAGNOSIS — M542 Cervicalgia: Secondary | ICD-10-CM | POA: Insufficient documentation

## 2022-09-02 NOTE — Therapy (Signed)
OUTPATIENT PHYSICAL THERAPY CERVICAL TREATMENT   Patient Name: David [name] MRN: [id number] DOB:1975/02/13, 47 y.o., male Today's Date: 09/02/2022  END OF SESSION:  PT End of Session - 09/02/22 1355     Visit Number 11    Date for PT Re-Evaluation 09/28/22    Authorization Type new insurance: [place] [employer] authorized 8 visits 08/07/22-09/28/22    Authorization - Visit Number 3    Authorization - Number of Visits 8    PT Start Time 1356    PT Stop Time 1438    PT Time Calculation (min) 42 min    Activity Tolerance Patient tolerated treatment well    Behavior During Therapy WFL for tasks assessed/performed                       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)    Pneumococcal bacteremia 06/09/2021   Pneumothorax    Left lung spontaneous pneumothorax at age 51 yr    Past Surgical History:  Procedure Laterality Date   chest tubes Left    IR GASTROSTOMY TUBE MOD SED  06/18/2021   LAPAROSCOPIC INSERTION GASTROSTOMY TUBE N/A 06/20/2021   Procedure: LAPAROSCOPIC INSERTION GASTROSTOMY TUBE;  Surgeon: Berna Bue, MD;  Location: [facility] OR;  Service: General;  Laterality: N/A;  DOW PATIENT   TRACHEOSTOMY TUBE PLACEMENT N/A 06/09/2021   Procedure: AWAKE TRACHEOSTOMY;  Surgeon: Christia Reading, MD;  Location: [facility] OR;  Service: ENT;  Laterality: N/A;   Patient Active Problem List   Diagnosis Date Noted   Torticollis 07/04/2022   Evaluation by medical service required 03/19/2022   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   Defective dental restoration  12/31/2021   Chronic periodontitis 12/31/2021   Impacted third molar tooth 12/31/2021   Irritant contact dermatitis associated with digestive stoma 12/25/2021   Irritation around percutaneous endoscopic gastrostomy (PEG) tube site (HCC) 12/25/2021   Leaking PEG tube (HCC) 12/12/2021   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   Continuous tobacco abuse 11/21/2021   Dermatitis associated with moisture 10/16/2021   Back pain 08/16/2021   Candidal endocarditis    History of radiation to head and neck region 07/11/2021   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   Dysgeusia 07/11/2021   S/P percutaneous endoscopic gastrostomy (PEG) tube placement (HCC) 06/24/2021   Pressure injury of skin 06/17/2021   Tracheostomy status (HCC)    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   Heart failure with reduced ejection fraction (HCC) 04/28/2021   Elevated troponin 04/01/2021   Elevated transaminase level 04/01/2021   Weight loss, unintentional 01/15/2021   Protein-calorie malnutrition, severe (HCC) 01/10/2021   Hypotension 01/08/2021   AKI (acute kidney injury) (HCC) 01/08/2021   Chemotherapy induced nausea and vomiting 01/01/2021   Encounter for dental examination 12/13/2020   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   Teeth missing 12/04/2020   Radiation caries 12/04/2020   Retained tooth root 12/04/2020   Chronic apical periodontitis 12/04/2020   Accretions on teeth 12/04/2020    PCP: Adron Bene, MD   REFERRING PROVIDER: Glee Arvin, NP   REFERRING DIAG: Diagnosis Information  Diagnosis  C32.1 (  ICD-10-CM) - Malignant neoplasm of supraglottis (HCC)  Z51.5 (ICD-10-CM) - Palliative care patient  Eval and treat for neck pain   THERAPY DIAG:  Cervicalgia  Abnormal posture  Muscle weakness (generalized)  Rationale for Evaluation and Treatment: Rehabilitation  ONSET DATE: 1 year history (trach placement)  SUBJECTIVE:                                                                                                                                                                                                          SUBJECTIVE STATEMENT: Today is a pretty good day.    Hand dominance: Right  PERTINENT HISTORY:  Tracheostomy, radiation to neck, malignant neoplasm of supraglottis  PAIN:  Are you having pain? Yes: NPRS scale: 3/10 Pain location: Lt>Rt neck and thoracic spine  Pain description: stiff, throbbing Aggravating factors: overstretching Relieving factors: not moving   PRECAUTIONS: Other: cancer, trach  WEIGHT BEARING RESTRICTIONS: No  FALLS:  Has patient fallen in last 6 months? No  LIVING ENVIRONMENT: Lives with: lives alone Lives in: House/apartment   PLOF: Independent, walking to mailbox  PATIENT GOALS: reduce neck pain, improve mobility   NEXT MD VISIT:   OBJECTIVE:   DIAGNOSTIC FINDINGS:  None   PATIENT SURVEYS:  5/30 NDI: 12/45 NDI 11/45  COGNITION: Overall cognitive status: Within functional limits for tasks assessed  SENSATION: WFL  POSTURE: rounded shoulders, forward head, increased thoracic kyphosis, and flexed trunk   PALPATION: 5/30:  Significant adaptive shortening of Rt SCM, lengthened enough now for Pt to be able to achieve neutral posture from resting in flexion/Rt SB/Lt Rot Over-lengthened and painful Lt cervical paraspinals and upper trap secondary to tone in Rt SCM Very tender SCM beneath jaw   Significant tension and reduced muscle length in bil neck. Pain on Rt vs Lt.    CERVICAL ROM:   Active ROM A/PROM (deg) eval A/ROM A/ROM 08/07/22  Flexion Rests in flexion Rests in  55 deg flexion, Rt SB, Lt Rot:  Able to  Right posture to 15 deg flexion Rests in 30 degrees flexion  Extension  Rests in flexion, Rt SB, Lt Rot: Able to correct to 15 deg flexion    Right lateral flexion Rests in 30 degrees   Rests in 20 degrees sidebending  Left lateral flexion 10 degrees Rt lateral flexion     Right rotation WFLs  From 15 deg flexion, 45  deg  50 degrees- rests in flexion  Left rotation WFLs From 15 deg flexion, 45 deg  70 degrees left- rest in flexion    (Blank  rows = not tested)  UPPER EXTREMITY ROM:  UE A/ROM is WFLs without pain  UPPER EXTREMITY MMT:  5/30 Neck strength: able to perform postural correction to near neutral and hold 36 sec x 1 reps  4+/5 bil UE strength, neck strength 4-/5 throughout   TODAY'S TREATMENT:    09/02/22: Supine elongation and passive stretch to Rt neck in supine with P/ROM and overpressure Active assisted stretching supine for neck extension and Lt SB for moving toward level posture - dynamic and brief periods of static holds at near end range Trigger Point Dry-Needling  Treatment instructions: Expect mild to moderate muscle soreness. S/S of pneumothorax if dry needled over a lung field, and to seek immediate medical attention should they occur. Patient verbalized understanding of these instructions and education.  Patient Consent Given: Yes Education handout provided: Previously provided Muscles treated: bil upper trap and cervical multifidi Electrical stimulation performed: No Parameters: N/A Treatment response/outcome: twitch and elongation  Seated in chair postural righting active assisted and active 5 rounds 10 sec holds    08/21/22: Elongation and passive stretch to Rt neck in supine with P/ROM and overpressure Sidelying on Lt- elongation to Rt cervical musculature.   08/12/22: Attempted positions to begin treatment and ultimately Pt stated he didn't feel he could do therapy today due to having challenges with trach and feeling like he couldn't breathe.  No treatment today.  08/07/22 Discussed cervical traction unit on [employer] to assist with upright posture- pt's mom will order to assist with posture training.  Manual:  supine with passive stretch and hold into Lt sidebending and rotation to pt tolerance.              Trigger Point Dry-Needling   Treatment instructions: Expect mild to moderate muscle soreness. S/S of pneumothorax if dry needled over a lung field, and to seek immediate medical attention should they occur. Patient verbalized understanding of these instructions and education.  Patient Consent Given: Yes Education handout provided: Previously provided Muscles treated: bil cervical multifidi, Rt cervical paraspinals and bil upper traps Treatment response/outcome: Utilized skilled palpation to identify trigger points.  During dry needling able to palpate muscle twitch and muscle elongation  Elongation to bil neck with pt prone Skilled palpation and monitoring by PT during dry needling                                                                                                                               PATIENT EDUCATION:  Education details: Access Code: [email or web address] Person educated: Patient Education method: Programmer, multimedia, Demonstration, and Handouts Education comprehension: verbalized understanding and returned demonstration  HOME EXERCISE PROGRAM: Access Code: [email or web address] URL: [email or web address] Date: 06/19/2022 Prepared by: Morton Peters  Exercises - Seated Scapular Retraction  - 5 x daily - 7 x weekly - 1 sets - 10 reps - 5 hold - Supine Scapular Retraction  - 3 x daily - 7 x weekly - 1 sets - 10 reps -  5 hold - Seated Correct Posture  - 1 x daily - 7 x weekly - 3 sets - 10 reps - Seated Cervical Retraction  - 1 x daily - 7 x weekly - 3 sets - 10 reps - Supine Chin Tuck  - 1 x daily - 7 x weekly - 3 sets - 10 reps - Seated Cervical Sidebending AROM  - 3 x daily - 7 x weekly - 1 sets - 10 reps - 5-10 hold - Seated Assisted Cervical Rotation with Towel  - 5 x daily - 7 x weekly - 1 sets - 3 reps - 10 hold  ASSESSMENT:  CLINICAL IMPRESSION: Pt arrived having a good day and was agreeable and tolerated well multiple manual therapy techniques including DN, passive stretching, active assisted  stretching and postural ROM and isometric holds.  He continues to rest in flexion, Rt SB with less obligatory Lt Rot.  He is able perform active assisted and active cues for more level and upright gaze with neck extension and Lt SB.  He continues to be very tight along Rt cervical spine.    Pt is motivated to continue with PT and will benefit from continued skilled PT for manual techniques to complement his HEP to improve muscle tightness, ROM and strength to improve QOL.  He stated he feels like he has more purpose by way of his HEP and even expressed interest in finding a hobby to do with his hands now that he is moving better such as building models or painting/drawing.   OBJECTIVE IMPAIRMENTS: decreased activity tolerance, increased muscle spasms, impaired flexibility, postural dysfunction, and pain.   ACTIVITY LIMITATIONS: sitting, standing, and reach over head  PARTICIPATION LIMITATIONS: meal prep, driving, and community activity  PERSONAL FACTORS: 1-2 comorbidities: cancer on pallative care, chronic postural dysfunction  are also affecting patient's functional outcome.   REHAB POTENTIAL: Good  CLINICAL DECISION MAKING: Stable/uncomplicated  EVALUATION COMPLEXITY: Moderate   GOALS: Goals reviewed with patient? Yes  SHORT TERM GOALS: Target date: 06/27/2022    Be independent in initial  HEP Baseline:  Goal status: met 5/30  2.  Verbalize and demonstrate postural corrections to improve tolerance for neutral posture and reduced neck strain  Baseline:  Goal status: met 5/30   LONG TERM GOALS: Target date: 09/27/22  Be independent in advanced HEP Baseline:  Goal status: ongoing  3.  Improve NDI to < or = to 7/45 Baseline: 11/45 Goal status: 12/45  3.   Perform independent postural correction and hold eyes fixed forward x 3-4 minutes   Baseline: 2 minutes (08/07/22) Goal status:revised    4.   Report > or = to 60-70% reduction in neck pain due to reduced tension and  strain in neck Baseline: 35-50%  Goal status:  revised   5. Rest i < or = to 10-15 degrees Rt sidebending x 3 minutes   Baseline: 20 degrees   Goal Status: NEW    PLAN:  PT FREQUENCY: 1x/week  PT DURATION: 6 weeks  PLANNED INTERVENTIONS: Therapeutic exercises, Therapeutic activity, Neuromuscular re-education, Balance training, Gait training, Patient/Family education, Self Care, Joint mobilization, Aquatic Therapy, Dry Needling, Electrical stimulation, Spinal mobilization, Cryotherapy, Moist heat, Taping, Traction, Manual therapy, and Re-evaluation  PLAN FOR NEXT SESSION:  Lt SL gravity assisted stretching and manual techniques, request more visits,  neutral head in supine and work on postural strength with band, DN again if helpful  Morton Peters, PT 09/02/22 2:44 PM      [facility]  [facility] [address], [address] [place], [place] [address] Phone # [phone] Fax [phone]

## 2022-09-04 ENCOUNTER — Encounter (HOSPITAL_BASED_OUTPATIENT_CLINIC_OR_DEPARTMENT_OTHER): Payer: Self-pay | Admitting: Radiology

## 2022-09-04 ENCOUNTER — Observation Stay (HOSPITAL_BASED_OUTPATIENT_CLINIC_OR_DEPARTMENT_OTHER)
Admission: EM | Admit: 2022-09-04 | Discharge: 2022-09-06 | Disposition: A | Discharge status: Home / Self Care | Payer: MEDICAID | Attending: Family Medicine | Admitting: Family Medicine

## 2022-09-04 ENCOUNTER — Emergency Department (HOSPITAL_BASED_OUTPATIENT_CLINIC_OR_DEPARTMENT_OTHER): Payer: MEDICAID

## 2022-09-04 ENCOUNTER — Other Ambulatory Visit: Payer: Self-pay

## 2022-09-04 DIAGNOSIS — E43 Unspecified severe protein-calorie malnutrition: Secondary | ICD-10-CM | POA: Diagnosis not present

## 2022-09-04 DIAGNOSIS — I82C19 Acute embolism and thrombosis of unspecified internal jugular vein: Secondary | ICD-10-CM | POA: Diagnosis present

## 2022-09-04 DIAGNOSIS — Z930 Tracheostomy status: Secondary | ICD-10-CM

## 2022-09-04 DIAGNOSIS — Z8589 Personal history of malignant neoplasm of other organs and systems: Secondary | ICD-10-CM | POA: Diagnosis not present

## 2022-09-04 DIAGNOSIS — H5789 Other specified disorders of eye and adnexa: Secondary | ICD-10-CM | POA: Diagnosis present

## 2022-09-04 DIAGNOSIS — R220 Localized swelling, mass and lump, head: Secondary | ICD-10-CM

## 2022-09-04 DIAGNOSIS — I5032 Chronic diastolic (congestive) heart failure: Secondary | ICD-10-CM | POA: Insufficient documentation

## 2022-09-04 DIAGNOSIS — I82C11 Acute embolism and thrombosis of right internal jugular vein: Secondary | ICD-10-CM | POA: Diagnosis not present

## 2022-09-04 DIAGNOSIS — C329 Malignant neoplasm of larynx, unspecified: Secondary | ICD-10-CM | POA: Diagnosis present

## 2022-09-04 DIAGNOSIS — I959 Hypotension, unspecified: Secondary | ICD-10-CM | POA: Diagnosis present

## 2022-09-04 DIAGNOSIS — I82890 Acute embolism and thrombosis of other specified veins: Secondary | ICD-10-CM | POA: Diagnosis present

## 2022-09-04 LAB — BASIC METABOLIC PANEL
Anion gap: 7 (ref 5–15)
BUN: 11 mg/dL (ref 6–20)
CO2: 26 mmol/L (ref 22–32)
Calcium: 8.5 mg/dL — ABNORMAL LOW (ref 8.9–10.3)
Chloride: 102 mmol/L (ref 98–111)
Creatinine, Ser: 0.82 mg/dL (ref 0.61–1.24)
GFR, Estimated: >60 mL/min (ref >60)
Glucose, Bld: 125 mg/dL — ABNORMAL HIGH (ref 70–99)
Potassium: 4 mmol/L (ref 3.5–5.1)
Sodium: 135 mmol/L (ref 135–145)

## 2022-09-04 LAB — CBC WITH DIFFERENTIAL/PLATELET
Abs Immature Granulocytes: 0.01 10*3/uL (ref 0.00–0.07)
Basophils Absolute: 0 10*3/uL (ref 0.0–0.1)
Basophils Relative: 1 %
Eosinophils Absolute: 0.1 10*3/uL (ref 0.0–0.5)
Eosinophils Relative: 3 %
HCT: 36.8 % — ABNORMAL LOW (ref 39.0–52.0)
Hemoglobin: 11.8 g/dL — ABNORMAL LOW (ref 13.0–17.0)
Immature Granulocytes: 0 %
Lymphocytes Relative: 13 %
Lymphs Abs: 0.5 10*3/uL — ABNORMAL LOW (ref 0.7–4.0)
MCH: 29.1 pg (ref 26.0–34.0)
MCHC: 32.1 g/dL (ref 30.0–36.0)
MCV: 90.9 fL (ref 80.0–100.0)
Monocytes Absolute: 0.6 10*3/uL (ref 0.1–1.0)
Monocytes Relative: 17 %
Neutro Abs: 2.5 10*3/uL (ref 1.7–7.7)
Neutrophils Relative %: 66 %
Platelets: 106 10*3/uL — ABNORMAL LOW (ref 150–400)
RBC: 4.05 MIL/uL — ABNORMAL LOW (ref 4.22–5.81)
RDW: 15.1 % (ref 11.5–15.5)
WBC: 3.8 10*3/uL — ABNORMAL LOW (ref 4.0–10.5)
nRBC: 0 % (ref 0.0–0.2)

## 2022-09-04 LAB — CBC
HCT: 34.4 % — ABNORMAL LOW (ref 39.0–52.0)
Hemoglobin: 11.3 g/dL — ABNORMAL LOW (ref 13.0–17.0)
MCH: 29.1 pg (ref 26.0–34.0)
MCHC: 32.8 g/dL (ref 30.0–36.0)
MCV: 88.7 fL (ref 80.0–100.0)
Platelets: 112 10*3/uL — ABNORMAL LOW (ref 150–400)
RBC: 3.88 MIL/uL — ABNORMAL LOW (ref 4.22–5.81)
RDW: 14.8 % (ref 11.5–15.5)
WBC: 3.1 10*3/uL — ABNORMAL LOW (ref 4.0–10.5)
nRBC: 0 % (ref 0.0–0.2)

## 2022-09-04 LAB — HEPARIN LEVEL (UNFRACTIONATED): Heparin Unfractionated: <0.1 IU/mL — ABNORMAL LOW (ref 0.30–0.70)

## 2022-09-04 LAB — PROTIME-INR
INR: 1.1 (ref 0.8–1.2)
Prothrombin Time: 14.7 seconds (ref 11.4–15.2)

## 2022-09-04 MED ORDER — ONDANSETRON HCL 4 MG/2ML IJ SOLN: 4.0000 mg | Freq: Four times a day (QID) | INTRAMUSCULAR | Status: DC | PRN

## 2022-09-04 MED ORDER — HEPARIN BOLUS VIA INFUSION
1500.0000 [IU] | Freq: Once | INTRAVENOUS | Status: AC
  Administered 2022-09-05: 1500 [IU] via INTRAVENOUS
  Filled 2022-09-04: qty 1500

## 2022-09-04 MED ORDER — MIDODRINE HCL 5 MG PO TABS
5.0000 mg | ORAL_TABLET | Freq: Three times a day (TID) | ORAL | Status: DC
  Administered 2022-09-05: 5 mg via ORAL
  Filled 2022-09-04: qty 1

## 2022-09-04 MED ORDER — HEPARIN BOLUS VIA INFUSION
2500.0000 [IU] | Freq: Once | INTRAVENOUS | Status: AC
  Administered 2022-09-04: 2500 [IU] via INTRAVENOUS

## 2022-09-04 MED ORDER — HEPARIN (PORCINE) 25000 UT/250ML-% IV SOLN
950.0000 [IU]/h | INTRAVENOUS | Status: DC
  Administered 2022-09-04: 700 [IU]/h via INTRAVENOUS
  Filled 2022-09-04: qty 250

## 2022-09-04 MED ORDER — ACETAMINOPHEN 650 MG RE SUPP: 650.0000 mg | Freq: Four times a day (QID) | RECTAL | Status: DC | PRN

## 2022-09-04 MED ORDER — IOHEXOL 300 MG/ML  SOLN
100.0000 mL | Freq: Once | INTRAMUSCULAR | Status: AC | PRN
  Administered 2022-09-04: 75 mL via INTRAVENOUS

## 2022-09-04 MED ORDER — ONDANSETRON HCL 4 MG PO TABS: 4.0000 mg | ORAL_TABLET | Freq: Four times a day (QID) | ORAL | Status: DC | PRN

## 2022-09-04 MED ORDER — METHADONE HCL 10 MG/ML PO CONC: 60.0000 mg | Freq: Every day | ORAL | Status: DC

## 2022-09-04 MED ORDER — DOCUSATE SODIUM 100 MG PO CAPS
100.0000 mg | ORAL_CAPSULE | Freq: Two times a day (BID) | ORAL | Status: DC
  Administered 2022-09-06: 100 mg via ORAL
  Filled 2022-09-04 (×2): qty 1

## 2022-09-04 MED ORDER — SODIUM CHLORIDE 0.9 % IV SOLN: INTRAVENOUS | Status: DC

## 2022-09-04 MED ORDER — ACETAMINOPHEN 325 MG PO TABS
650.0000 mg | ORAL_TABLET | Freq: Four times a day (QID) | ORAL | Status: DC | PRN
  Administered 2022-09-05: 650 mg via ORAL
  Filled 2022-09-04: qty 2

## 2022-09-04 NOTE — ED Notes (Signed)
Patient transported to CT. Patient removed from monitor from this provider. Patient a level 2, but stable to go to CT without assistance.

## 2022-09-04 NOTE — Progress Notes (Signed)
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 To start IV heparin. Baseline Hgb is slightly low and platelets are also low. He is not on anticoagulation PTA.   Goal of Therapy:  Heparin level 0.3-0.7 units/ml Monitor platelets by anticoagulation protocol: Yes   Plan:  Heparin bolus 2500 units IV x 1 Heparin gtt 700 units/hr Check a 6 hr heparin level  Daily heparin level and CBC  , Drake Leach 09/04/2022,3:23 PM

## 2022-09-04 NOTE — ED Provider Notes (Signed)
[place] [facility] AT [facility] Provider Note   CSN: [id number] Arrival date & time: 09/04/22  1113     History {Add pertinent medical, surgical, social history, OB history to HPI:1} Chief Complaint  Patient presents with   Eye Problem    David [name] is a 47 y.o. male.  He has significant past medical history including neck cancer, tracheostomy, endocarditis.  He said he woke up this morning with some swelling around his right eye.  No eye pain or vision changes.  No trauma.  No fevers.  He has had some nasal congestion steadies been blowing his nose hard.  No headache.  The history is provided by the patient.  Eye Problem Location:  Right eye Quality: no pain. Severity:  Moderate Timing:  Constant Progression:  Unchanged Chronicity:  New Context: not direct trauma   Relieved by:  None tried Worsened by:  Nothing Ineffective treatments:  None tried Associated symptoms: swelling   Associated symptoms: no decreased vision, no double vision, no headaches, no nausea and no vomiting        Home Medications Prior to Admission medications   Medication Sig Start Date End Date Taking? Authorizing Provider  fluconazole (DIFLUCAN) 200 MG tablet Take 4 tablets (800 mg total) by mouth daily. 03/04/22   Pickenpack-Cousar, Arty Baumgartner, NP  lidocaine (LIDODERM) 5 % Place 1 patch onto the skin daily. Remove & Discard patch within 12 hours or as directed by MD 08/16/22   Pickenpack-Cousar, Arty Baumgartner, NP  lidocaine (XYLOCAINE) 2 % solution Patient: Mix 1part 2% viscous lidocaine, 1part H20. Swish and spit 10mL of diluted mixture up to eight times a day, prn soreness 08/16/22   Pickenpack-Cousar, Arty Baumgartner, NP  methadone (DOLOPHINE) 10 MG/ML solution Take 6 mLs (60 mg total) by mouth daily. Patient taking differently: Take 90 mg by mouth daily. 01/08/21   Pickenpack-Cousar, Arty Baumgartner, NP  midodrine (PROAMATINE) 5 MG tablet Take 1 tablet (5 mg total) by mouth 3 (three)  times daily with meals. 12/19/21   Maisie Fus, MD  ondansetron (ZOFRAN) 8 MG tablet Take 1 tablet (8 mg total) by mouth every 8 (eight) hours as needed for nausea or vomiting. Starting day 3 after chemotherapy 03/04/22   Pickenpack-Cousar, Arty Baumgartner, NP  Pregabalin 20 MG/ML SOLN Take 40 mg by mouth 3 (three) times daily. 07/10/22   Pickenpack-Cousar, Arty Baumgartner, NP      Allergies    Patient has no known allergies.    Review of Systems   Review of Systems  Constitutional:  Negative for fever.  Eyes:  Negative for double vision, pain and visual disturbance.  Respiratory:  Negative for shortness of breath.   Cardiovascular:  Negative for chest pain.  Gastrointestinal:  Negative for nausea and vomiting.  Neurological:  Negative for headaches.    Physical Exam Updated Vital Signs BP (!) 74/56 (BP Location: Left Arm)   Pulse 66   Temp 98.4 F (36.9 C) (Oral)   Resp 17   SpO2 98%  Physical Exam Vitals and nursing note reviewed.  Constitutional:      General: He is not in acute distress.    Appearance: Normal appearance. He is well-developed.  HENT:     Head: Normocephalic and atraumatic.  Eyes:     Extraocular Movements: Extraocular movements intact.     Conjunctiva/sclera: Conjunctivae normal.     Pupils: Pupils are equal, round, and reactive to light.     Comments: Patient has significant edema of  his upper and lower eyelid.  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     Heart sounds: No murmur heard. Pulmonary:     Effort: Pulmonary effort is normal. No respiratory distress.     Breath sounds: Normal breath sounds.  Abdominal:     Palpations: Abdomen is soft.     Tenderness: There is no abdominal tenderness. There is no guarding or  rebound.  Skin:    General: Skin is warm and dry.     Capillary Refill: Capillary refill takes less than 2 seconds.  Neurological:     Mental Status: He is alert.     ED Results / Procedures / Treatments   Labs (all labs ordered are listed, but only abnormal results are displayed) Labs Reviewed  CBC WITH DIFFERENTIAL/PLATELET  BASIC METABOLIC PANEL    EKG EKG Interpretation Date/Time:  Wednesday September 04 2022 11:56:22 EDT Ventricular Rate:  62 PR Interval:  160 QRS Duration:  100 QT Interval:  491 QTC Calculation: 499 R Axis:   79  Text Interpretation: Sinus rhythm Borderline prolonged QT interval rate slower than prior 7/23 Confirmed by Meridee Score [phone]) on 09/04/2022 11:57:21 AM  Radiology No results found.  Procedures Procedures  {Document cardiac monitor, telemetry assessment procedure when appropriate:1}  Medications Ordered in ED Medications - No data to display  ED Course/ Medical Decision Making/ A&P   {   Click here for ABCD2, HEART and other calculatorsREFRESH Note before signing :1}                              Medical Decision Making Amount and/or Complexity of Data Reviewed Labs: ordered. Radiology: ordered.   This patient complains of ***; this involves an extensive number of treatment Options and is a complaint that carries with it a high risk of complications and morbidity. The differential includes ***  I ordered, reviewed and interpreted labs, which included *** I ordered medication *** and reviewed PMP when indicated. I ordered imaging studies which included *** and I independently    visualized and interpreted imaging which showed *** Additional history obtained from *** Previous records obtained and reviewed *** I consulted *** and discussed lab and imaging findings and discussed disposition.  Cardiac monitoring reviewed, *** Social determinants considered, *** Critical Interventions: ***  After the interventions stated above,  I reevaluated the patient and found *** Admission and further testing considered, ***   {Document critical care time when appropriate:1} {Document review of labs and clinical decision tools ie heart score, Chads2Vasc2 etc:1}  {Document your independent review of radiology images, and any outside records:1} {Document your discussion with family members, caretakers, and with consultants:1} {Document social determinants of health affecting pt's care:1} {Document your decision making why or why not admission, treatments were needed:1} Final Clinical Impression(s) / ED Diagnoses Final diagnoses:  None    Rx / DC Orders ED Discharge Orders     None

## 2022-09-04 NOTE — ED Triage Notes (Signed)
Patient presents to ED via POV from home with mother. Here with right upper eye lid swelling. Denies injury or trauma. Trach patient. Cancer, not on active treatment at this time.

## 2022-09-04 NOTE — Progress Notes (Signed)
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 Brief A/P: Heparin level subtherapeutic Increase Heparin rate'  No Known Allergies  Patient Measurements: Height: 5\' 10"  (177.8 cm) Weight: 42.7 kg (94 lb 2.2 oz) IBW/kg (Calculated) : 73 Heparin Dosing Weight: 42.7kg  Vital Signs: Temp: 98 F (36.7 C) (08/07 2025) Temp Source: Oral (08/07 2025) BP: 117/84 (08/07 2025) Pulse Rate: 72 (08/07 2045)  Labs: Recent Labs    09/04/22 1204 09/04/22 1514 09/04/22 2144 09/04/22 2314  HGB 11.8*  --   --  11.3*  HCT 36.8*  --   --  34.4*  PLT 106*  --   --  112*  LABPROT  --  14.7  --   --   INR  --  1.1  --   --   HEPARINUNFRC  --   --  <0.10*  --   CREATININE 0.82  --   --   --     Estimated Creatinine Clearance: 67.3 mL/min (by C-G formula based on SCr of 0.82 mg/dL).   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   Eddie Candle 09/04/2022,11:54 PM

## 2022-09-04 NOTE — H&P (Signed)
History and Physical    David [name] ZOX:[id number] DOB: 1975/07/21 DOA: 09/04/2022  PCP: Faith Rogue, DO   Patient coming from: Home.  I have personally briefly reviewed patient's old medical records in [facility]  Chief Complaint:  Right sided face swelling   HPI: David [name] is a 47 y.o. male with PMH significant of head and neck cancer, history of radiation therapy, status post tracheostomy,with trach collar,  history of endocarditis presented in the ED for right facial swelling.  Patient reports he woke up this morning with swelling around the right eye,  right face, denies any pain, shortness of breath, difficulty swallowing, denies any visual changes, dizziness.  he thought it was related to the chemotherapy. He denies any fever, chills, recent travel, sick contacts.  ED Course: He was hypotensive other vitals were stable. Temp 98.4, HR 97, RR 17, BP 70/56 which improved to 101/75 after IV hydration, SpO2 100% on room air Labs include sodium 135, potassium 4.0, chloride 102, bicarb 26, glucose 125, BUN 11, creatinine 0.82, calcium 8.5, WBC 3.8, hemoglobin 11.8, hematocrit 36.8, platelet 106, PT 14.7, INR 1.1, CT maxillofacial: New obstruction of the right internal jugular vein, likely secondary to a peripherally calcified and previously biopsied right level 2A lymph node, with associated extensive subcutaneous edema throughout the right face and involving the right parotid and submandibular glands. ED physician has spoken with Dr. Myra Gianotti who advised medical admission and will see patient in consult,  advised IV heparin.  Review of Systems:  Review of Systems  Constitutional: Negative.   HENT: Negative.         Right facial swelling.  Eyes: Negative.   Respiratory: Negative.         Status post tracheostomy with trach collar.  Cardiovascular: Negative.   Gastrointestinal: Negative.   Genitourinary: Negative.   Musculoskeletal: Negative.   Skin:  Negative.   Neurological: Negative.   Endo/Heme/Allergies: Negative.   Psychiatric/Behavioral: Negative.      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;  Service: ENT;  Laterality: N/A;     reports that he has been smoking cigarettes. He has never used smokeless tobacco. He reports that he does not currently use alcohol after a past usage of about 12.0 standard drinks of alcohol per week. He reports that he does not currently use drugs after having used the following drugs: IV.  No Known Allergies  No family history on file. Family history reviewed and not pertinent.  Prior to Admission medications   Medication Sig Start Date End Date Taking? Authorizing  Provider  fluconazole (DIFLUCAN) 200 MG tablet Take 4 tablets (800 mg total) by mouth daily. 03/04/22   Pickenpack-Cousar, Arty Baumgartner, NP  lidocaine (LIDODERM) 5 % Place 1 patch onto the skin daily. Remove & Discard patch within 12 hours or as directed by MD 08/16/22   Pickenpack-Cousar, Arty Baumgartner, NP  lidocaine (XYLOCAINE) 2 % solution  Patient: Mix 1part 2% viscous lidocaine, 1part H20. Swish and spit 10mL of diluted mixture up to eight times a day, prn soreness 08/16/22   Pickenpack-Cousar, Arty Baumgartner, NP  methadone (DOLOPHINE) 10 MG/ML solution Take 6 mLs (60 mg total) by mouth daily. Patient taking differently: Take 90 mg by mouth daily. 01/08/21   Pickenpack-Cousar, Arty Baumgartner, NP  midodrine (PROAMATINE) 5 MG tablet Take 1 tablet (5 mg total) by mouth 3 (three) times daily with meals. 12/19/21   Maisie Fus, MD  ondansetron (ZOFRAN) 8 MG tablet Take 1 tablet (8 mg total) by mouth every 8 (eight) hours as needed for nausea or vomiting. Starting day 3 after chemotherapy 03/04/22   Pickenpack-Cousar, Arty Baumgartner, NP  Pregabalin 20 MG/ML SOLN Take 40 mg by mouth 3 (three) times daily. 07/10/22   Pickenpack-CousarArty Baumgartner, NP    Physical Exam: Vitals:   09/04/22 1445 09/04/22 1500 09/04/22 1515 09/04/22 1724  BP: 101/75  117/70 101/74  Pulse:    63  Resp: (!) 21  10 16   Temp:   98.2 F (36.8 C) 98 F (36.7 C)  TempSrc:   Oral Oral  SpO2:    100%  Weight:  42.7 kg    Height:  5\' 10"  (1.778 m)      Constitutional: Appears comfortable, not in any acute distress, deconditioned, cachectic Vitals:   09/04/22 1445 09/04/22 1500 09/04/22 1515 09/04/22 1724  BP: 101/75  117/70 101/74  Pulse:    63  Resp: (!) 21  10 16   Temp:   98.2 F (36.8 C) 98 F (36.7 C)  TempSrc:   Oral Oral  SpO2:    100%  Weight:  42.7 kg    Height:  5\' 10"  (1.778 m)     Eyes: PERRL, lids and conjunctivae normal ENMT: Mucous membranes are moist.  Right-sided facial swelling, no erythema, no tenderness, no warmth. Neck: normal, supple, no masses, no thyromegaly Respiratory: clear to auscultation bilaterally, no wheezing, no crackles. Normal respiratory effort. No accessory muscle use.  Cardiovascular: S1-S2 heard, regular rate and rhythm, no murmurs / rubs / gallops.  No carotid bruits.  Abdomen: Soft, no tenderness, no masses palpated. No  hepatosplenomegaly. Bowel sounds positive.  Musculoskeletal: no clubbing / cyanosis. No joint deformity upper and lower extremities. Good ROM, no contractures. Normal muscle tone.  Skin: no rashes, lesions, ulcers. No induration Neurologic: CN 2-12 grossly intact. Sensation intact, DTR normal. Strength 5/5 in all 4.  Psychiatric: Normal judgment and insight. Alert and oriented x 3. Normal mood.     Labs on Admission: I have personally reviewed following labs and imaging studies  CBC: Recent Labs  Lab 09/04/22 1204  WBC 3.8*  NEUTROABS 2.5  HGB 11.8*  HCT 36.8*  MCV 90.9  PLT 106*   Basic Metabolic Panel: Recent Labs  Lab 09/04/22 1204  NA 135  K 4.0  CL 102  CO2 26  GLUCOSE 125*  BUN 11  CREATININE 0.82  CALCIUM 8.5*   GFR: Estimated Creatinine Clearance: 67.3 mL/min (by C-G formula based on SCr of 0.82 mg/dL). Liver Function Tests: No results for input(s): "AST", "ALT", "  ALKPHOS", "BILITOT", "PROT", "ALBUMIN" in the last 168 hours. No results for input(s): "LIPASE", "AMYLASE" in the last 168 hours. No results for input(s): "AMMONIA" in the last 168 hours. Coagulation Profile: Recent Labs  Lab 09/04/22 1514  INR 1.1   Cardiac Enzymes: No results for input(s): "CKTOTAL", "CKMB", "CKMBINDEX", "TROPONINI" in the last 168 hours. BNP (last 3 results) No results for input(s): "PROBNP" in the last 8760 hours. HbA1C: No results for input(s): "HGBA1C" in the last 72 hours. CBG: No results for input(s): "GLUCAP" in the last 168 hours. Lipid Profile: No results for input(s): "CHOL", "HDL", "LDLCALC", "TRIG", "CHOLHDL", "LDLDIRECT" in the last 72 hours. Thyroid Function Tests: No results for input(s): "TSH", "T4TOTAL", "FREET4", "T3FREE", "THYROIDAB" in the last 72 hours. Anemia Panel: No results for input(s): "VITAMINB12", "FOLATE", "FERRITIN", "TIBC", "IRON", "RETICCTPCT" in the last 72 hours. Urine analysis:    Component Value Date/Time   COLORURINE YELLOW  08/10/2021 1012   APPEARANCEUR CLEAR 08/10/2021 1012   LABSPEC 1.025 08/10/2021 1012   PHURINE 6.0 08/10/2021 1012   GLUCOSEU NEGATIVE 08/10/2021 1012   HGBUR NEGATIVE 08/10/2021 1012   BILIRUBINUR NEGATIVE 08/10/2021 1012   KETONESUR NEGATIVE 08/10/2021 1012   PROTEINUR NEGATIVE 08/10/2021 1012   NITRITE NEGATIVE 08/10/2021 1012   LEUKOCYTESUR NEGATIVE 08/10/2021 1012    Radiological Exams on Admission: CT Maxillofacial W Contrast  Addendum Date: 09/04/2022   ADDENDUM REPORT: 09/04/2022 15:03 ADDENDUM: CORRECTION: Occlusion of the RIGHT internal jugular vein, not the left. Electronically Signed   By: Orvan Falconer M.D.   On: 09/04/2022 15:03   Result Date: 09/04/2022 CLINICAL DATA:  Right-sided facial swelling. Right upper eyelid swelling. EXAM: CT MAXILLOFACIAL WITH CONTRAST TECHNIQUE: Multidetector CT imaging of the maxillofacial structures was performed with intravenous contrast. Multiplanar CT image reconstructions were also generated. RADIATION DOSE REDUCTION: This exam was performed according to the departmental dose-optimization program which includes automated exposure control, adjustment of the mA and/or kV according to patient size and/or use of iterative reconstruction technique. CONTRAST:  75mL OMNIPAQUE IOHEXOL 300 MG/ML  SOLN COMPARISON:  Neck CT 08/11/2021. Ultrasound-guided cervical lymph node biopsy 09/14/2021. FINDINGS: Osseous: No fracture or mandibular dislocation. Extensive dental caries. Orbits: Negative. No traumatic or inflammatory finding. Sinuses: Mild mucosal disease of the right maxillary sinus. Soft tissues: The previously biopsied, peripherally calcified right level 2A lymph node is unchanged in size, measuring up to 31 x 30 mm (sagittal image 25 series 7). 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  Limited intracranial: Unremarkable. IMPRESSION: 1. 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 2. Extensive dental caries. Electronically Signed: By: Orvan Falconer M.D. On: 09/04/2022 14:50    EKG: Independently reviewed.  Sinus rhythm, borderline QT interval.  Assessment/Plan Principal Problem:   Jugular vein occlusion, right (HCC) Active Problems:   Laryngeal cancer (HCC)   Hypotension   Protein-calorie malnutrition, severe (HCC)   Tracheostomy status (HCC)   Jugular vein occlusion (HCC)  Right internal juglar vein occlusion: Patient presented in the ED with right-sided facial swelling, denies any pain, redness, visual changes CT maxillofacial showed new obstruction of right internal jugular vein. EDP discussed the case with Dr. Ruel Favors, Vascular surgery recommended medical admission. Continue IV heparin,  with subsequent plan to switch to DOAC. Continue methadone 60 mg daily. There is no signs of erythema, warmth or tenderness, hold on antibiotics  Head and neck cancer: Patient follows up with Dr. Al Pimple, added to the treatment team. Follow-up outpatient.  Chronic pain syndrome: Continue methadone 60 mg daily Patient said he takes 120 mg daily. Please confirm with methadone clinic in the morning the appropriate dosing.  S/P Tracheostomy: Continue tracheostomy care.  Hypotension: Patient states he takes midodrine,   His Systolic blood pressure remains in 90s.  Protein calorie malnutrition: Nutrition consult   Leukopenia: Thrombocytopenia: Likely secondary to chemotherapy. No signs of bleeding, continue to monitor   DVT prophylaxis: Heparin IV Code Status: Full code Family Communication: No family at bed side. Disposition Plan:  Status  is: Inpatient Remains inpatient appropriate because: Admitted for right internal juglar vein occlusion.   Consults called: Vascular Dr. Lear Ng Admission status: Inpatient   Willeen Niece MD [facility]   If 7PM-7AM, please contact night-coverage   09/04/2022, 6:11 PM

## 2022-09-04 NOTE — ED Notes (Signed)
ED TO INPATIENT HANDOFF REPORT  ED Nurse Name and Phone #: Dominga Ferry [facility] Paramedic [phone]  S Name/Age/Gender Lennart Pall 47 y.o. male Room/Bed: MH11/MH11  Code Status   Code Status: Prior  Home/SNF/Other Home Patient oriented to: self Is this baseline? Yes   Triage Complete: Triage complete  Chief Complaint Jugular vein occlusion (HCC) [U98.C19]  Triage Note Patient presents to ED via POV from home with mother. Here with right upper eye lid swelling. Denies injury or trauma. Trach patient. Cancer, not on active treatment at this time.    Allergies No Known Allergies  Level of Care/Admitting Diagnosis ED Disposition     ED Disposition  Admit   Condition  --   Comment  Hospital [facility] [[id number]]  Level of Care: Telemetry Medical [104]  May admit patient to [facility] or [facility] if equivalent level of care is available:: No  Interfacility transfer: Yes  Covid Evaluation: Asymptomatic - no recent exposure (last 10 days) testing not required  Diagnosis: Jugular vein occlusion [facility]) [119147]  Admitting Physician: Lanae Boast [[id number]]  Attending Physician: Terrilee Files [[id number]]  Certification:: I certify this patient will need inpatient services for at least 2 midnights  Estimated Length of Stay: 2          B Medical/Surgery History 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)    Pneumococcal bacteremia 06/09/2021   Pneumothorax    Left lung spontaneous pneumothorax at age 52 yr    Past Surgical History:  Procedure Laterality Date   chest tubes Left     IR GASTROSTOMY TUBE MOD SED  06/18/2021   LAPAROSCOPIC INSERTION GASTROSTOMY TUBE N/A 06/20/2021   Procedure: LAPAROSCOPIC INSERTION GASTROSTOMY TUBE;  Surgeon: Berna Bue, MD;  Location: [facility] OR;  Service: General;  Laterality: N/A;  DOW PATIENT   TRACHEOSTOMY TUBE PLACEMENT N/A 06/09/2021   Procedure: AWAKE TRACHEOSTOMY;  Surgeon: Christia Reading, MD;  Location: [facility] OR;  Service: ENT;  Laterality: N/A;     A IV Location/Drains/Wounds Patient Lines/Drains/Airways Status     Active Line/Drains/Airways     Name Placement date Placement time Site Days   Peripheral IV 09/04/22 20 G Right Antecubital 09/04/22  1243  Antecubital  less than 1   Gastrostomy/Enterostomy Gastrostomy 18 Fr. LUQ 06/20/21  0832  LUQ  441   Incision - 3 Ports Abdomen 1: Right;Lateral 2: Right;Lateral;Medial 3: Umbilicus 06/20/21  0805  -- 441   Tracheostomy Shiley Flexible 4 mm Uncuffed 08/15/21  1540  4 mm  385   Pressure Injury 06/16/21 Sacrum Medial Stage 2 -  Partial thickness loss of dermis presenting as a shallow open injury with a red, pink wound bed without slough. 06/16/21  2000  -- 445   Wound / Incision (Open or Dehisced) 09/14/21 Puncture Neck Right cervical lymph node bx site 09/14/21  1438  Neck  355            Intake/Output Last 24 hours No intake or output data in the 24 hours ending 09/04/22 1551  Labs/Imaging Results for orders placed or performed during the hospital encounter of 09/04/22 (from the  past 48 hour(s))  CBC with Differential     Status: Abnormal   Collection Time: 09/04/22 12:04 PM  Result Value Ref Range   WBC 3.8 (L) 4.0 - 10.5 K/uL   RBC 4.05 (L) 4.22 - 5.81 MIL/uL   Hemoglobin 11.8 (L) 13.0 - 17.0 g/dL   HCT 91.4 (L) 78.2 - 95.6 %   MCV 90.9 80.0 - 100.0 fL   MCH 29.1 26.0 - 34.0 pg   MCHC 32.1 30.0 - 36.0 g/dL   RDW 21.3 08.6 - 57.8 %   Platelets 106 (L) 150 - 400 K/uL   nRBC 0.0 0.0 - 0.2 %   Neutrophils Relative % 66 %   Neutro Abs 2.5 1.7 - 7.7 K/uL    Lymphocytes Relative 13 %   Lymphs Abs 0.5 (L) 0.7 - 4.0 K/uL   Monocytes Relative 17 %   Monocytes Absolute 0.6 0.1 - 1.0 K/uL   Eosinophils Relative 3 %   Eosinophils Absolute 0.1 0.0 - 0.5 K/uL   Basophils Relative 1 %   Basophils Absolute 0.0 0.0 - 0.1 K/uL   Immature Granulocytes 0 %   Abs Immature Granulocytes 0.01 0.00 - 0.07 K/uL    Comment: Performed at [facility], [address] [place], [place] [address]  Basic metabolic panel     Status: Abnormal   Collection Time: 09/04/22 12:04 PM  Result Value Ref Range   Sodium 135 135 - 145 mmol/L   Potassium 4.0 3.5 - 5.1 mmol/L   Chloride 102 98 - 111 mmol/L   CO2 26 22 - 32 mmol/L   Glucose, Bld 125 (H) 70 - 99 mg/dL    Comment: Glucose reference range applies only to samples taken after fasting for at least 8 hours.   BUN 11 6 - 20 mg/dL   Creatinine, Ser 9.52 0.61 - 1.24 mg/dL   Calcium 8.5 (L) 8.9 - 10.3 mg/dL   GFR, Estimated >84 >13 mL/min    Comment: (NOTE) Calculated using the CKD-EPI Creatinine Equation (2021)    Anion gap 7 5 - 15    Comment: Performed at [facility], [address] [place], [place] [address]  Protime-INR     Status: None   Collection Time: 09/04/22  3:14 PM  Result Value Ref Range   Prothrombin Time 14.7 11.4 - 15.2 seconds   INR 1.1 0.8 - 1.2    Comment: (NOTE) INR goal varies based on device and disease states. Performed at [facility], [address], [place], [place] [address]    CT Maxillofacial W Contrast  Addendum Date: 09/04/2022   ADDENDUM REPORT: 09/04/2022 15:03 ADDENDUM: CORRECTION: Occlusion of the RIGHT internal jugular vein, not the left. Electronically Signed   By: Orvan Falconer M.D.   On: 09/04/2022 15:03   Result Date: 09/04/2022 CLINICAL DATA:  Right-sided facial swelling. Right upper eyelid swelling. EXAM: CT MAXILLOFACIAL WITH CONTRAST TECHNIQUE: Multidetector CT imaging of the maxillofacial structures was performed with intravenous  contrast. Multiplanar CT image reconstructions were also generated. RADIATION DOSE REDUCTION: This exam was performed according to the departmental dose-optimization program which includes automated exposure control, adjustment of the mA and/or kV according to patient size and/or use of iterative reconstruction technique. CONTRAST:  75mL OMNIPAQUE IOHEXOL 300 MG/ML  SOLN COMPARISON:  Neck CT 08/11/2021. Ultrasound-guided cervical lymph node biopsy 09/14/2021. FINDINGS: Osseous: No fracture or mandibular dislocation. Extensive dental caries. Orbits: Negative. No traumatic or inflammatory finding. Sinuses: Mild mucosal  disease of the right maxillary sinus. Soft tissues: The previously biopsied, peripherally calcified right level 2A lymph node is unchanged in size, measuring up to 31 x 30 mm (sagittal image 25 series 7). 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 Limited intracranial: Unremarkable. IMPRESSION: 1. 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 2. Extensive dental caries. Electronically Signed: By: Orvan Falconer M.D. On: 09/04/2022 14:50    Pending Labs Unresulted Labs (From admission, onward)     Start     Ordered   09/04/22 2130  Heparin level (unfractionated)  Once-Timed,   URGENT        09/04/22 1522   09/04/22 2130  CBC  Once-Timed,   STAT        09/04/22 1522            Vitals/Pain Today's Vitals   09/04/22 1430 09/04/22 1445 09/04/22 1500 09/04/22 1515  BP: 104/73 101/75  117/70  Pulse:      Resp: 10 (!) 21  10  Temp:    98.2 F (36.8 C)  TempSrc:    Oral  SpO2:      Weight:   94 lb  2.2 oz (42.7 kg)   Height:   5\' 10"  (1.778 m)   PainSc:        Isolation Precautions No active isolations  Medications Medications  heparin ADULT infusion 100 units/mL (25000 units/281mL) (700 Units/hr Intravenous New Bag/Given 09/04/22 1532)  iohexol (OMNIPAQUE) 300 MG/ML solution 100 mL (75 mLs Intravenous Contrast Given 09/04/22 1340)  heparin bolus via infusion 2,500 Units (2,500 Units Intravenous Bolus from Bag 09/04/22 1532)    Mobility walks with person assist     Focused Assessments     R Recommendations: See Admitting Provider Note  Report given to:   Additional Notes:

## 2022-09-04 NOTE — Discharge Instructions (Signed)
Cool compress. Avoid blowing nose. Return to emergency department if any worsening or concerning symptoms.

## 2022-09-04 NOTE — ED Notes (Signed)
Called care Link for Transport talked to [facility] at 3:34

## 2022-09-04 NOTE — ED Notes (Signed)
Unsuccessful IV attempt x2.  

## 2022-09-05 ENCOUNTER — Other Ambulatory Visit (HOSPITAL_COMMUNITY): Payer: Self-pay

## 2022-09-05 DIAGNOSIS — I82890 Acute embolism and thrombosis of other specified veins: Secondary | ICD-10-CM | POA: Diagnosis present

## 2022-09-05 DIAGNOSIS — I82C11 Acute embolism and thrombosis of right internal jugular vein: Secondary | ICD-10-CM | POA: Diagnosis not present

## 2022-09-05 MED ORDER — APIXABAN 5 MG PO TABS
10.0000 mg | ORAL_TABLET | Freq: Two times a day (BID) | ORAL | Status: DC
  Administered 2022-09-05 – 2022-09-06 (×3): 10 mg via ORAL
  Filled 2022-09-05 (×3): qty 2

## 2022-09-05 MED ORDER — DIAZEPAM 2 MG PO TABS
2.0000 mg | ORAL_TABLET | Freq: Four times a day (QID) | ORAL | Status: DC | PRN
  Administered 2022-09-05 – 2022-09-06 (×3): 2 mg via ORAL
  Filled 2022-09-05 (×3): qty 1

## 2022-09-05 MED ORDER — LIDOCAINE VISCOUS HCL 2 % MT SOLN
15.0000 mL | OROMUCOSAL | Status: DC | PRN
  Administered 2022-09-06: 15 mL via OROMUCOSAL
  Filled 2022-09-05 (×2): qty 15

## 2022-09-05 MED ORDER — METHADONE HCL 10 MG PO TABS
120.0000 mg | ORAL_TABLET | Freq: Every day | ORAL | Status: DC
  Administered 2022-09-05 – 2022-09-06 (×2): 120 mg via ORAL
  Filled 2022-09-05 (×2): qty 12

## 2022-09-05 MED ORDER — APIXABAN 5 MG PO TABS: 5.0000 mg | ORAL_TABLET | Freq: Two times a day (BID) | ORAL | Status: DC

## 2022-09-05 NOTE — Progress Notes (Signed)
[facility] PROGRESS NOTE  Amram Plymire (DOB: 1975-06-22) ZOX:[id number] PCP: Faith Rogue, DO  Brief Narrative: HPI: David [name] is a 47 y.o. male with PMH significant of head and neck cancer, history of radiation therapy, status post tracheostomy,with trach collar,  history of endocarditis presented in the ED for right facial swelling.  Patient reports he woke up this morning with swelling around the right eye,  right face, denies any pain, shortness of breath, difficulty swallowing, denies any visual changes, dizziness.  he thought it was related to the chemotherapy. He denies any fever, chills, recent travel, sick contacts.   ED Course: He was hypotensive other vitals were stable. Temp 98.4, HR 97, RR 17, BP 70/56 which improved to 101/75 after IV hydration, SpO2 100% on room air Labs include sodium 135, potassium 4.0, chloride 102, bicarb 26, glucose 125, BUN 11, creatinine 0.82, calcium 8.5, WBC 3.8, hemoglobin 11.8, hematocrit 36.8, platelet 106, PT 14.7, INR 1.1, CT maxillofacial: New obstruction of the right internal jugular vein, likely secondary to a peripherally calcified and previously biopsied right level 2A lymph node, with associated extensive subcutaneous edema throughout the right face and involving the right parotid and submandibular glands. ED physician has spoken with Dr. Myra Gianotti who advised medical admission and will see patient in consult,  advised IV heparin.   Hospital Course: Swelling has improved initially but since slightly worse the following morning. Vascular surgery hoping to avoid intervention due to history of radiation to the area. Platelets low.   Subjective: Swelling on right face got a lot better yesterday but woke up with worse swelling again today, not as bad as yesterday. No bleeding reported. Feels anxious.   Objective: BP 115/89 (BP Location: Left Arm)   Pulse 68   Temp 98.4 F (36.9 C) (Oral)   Resp 14   Ht 5\' 10"  (1.778 m)    Wt 42.7 kg   SpO2 99%   BMI 13.51 kg/m   Gen: Chronically ill-appearing, frail male in no distress HEENT: Right facial swelling not extending to arm Neck: Trach in good position, no exudate Pulm: Clear, nonlabored  CV: RRR, NSR on tele strips GI: Soft, NT, ND, +BS, PEG scar Neuro: Alert and oriented. No new focal deficits. Ext: Warm, decreased muscle bulk Skin: No new rashes, lesions or ulcers on visualized skin   Assessment & Plan: Right internal juglar vein occlusion: - VVS consulted, d/w Dr. Myra Gianotti. History of radiation to the area in question increases friability of the vessel, so having a high threshold to thrombectomy. If symptoms continue improving on anticoagulation, hopefully can avoid it.  - Continue anticoagulation, transition to eliquis.   Thrombocytopenia: This dose not appear to be a chronic problem, though with pancytopenia could be attributable to malignancy/therapy. - Monitor in AM, switching away from heparin.   Head and neck cancer: Patient follows up with Dr. Al Pimple, added to the treatment team. Also a palliative care patient.  Follow-up outpatient.   Chronic pain syndrome: Continue methadone 120 mg daily (confirmed).    S/P Tracheostomy: Continue tracheostomy care.   Hypotension: - Continue midodrine   Protein calorie malnutrition: Nutrition consult    Tyrone Nine, MD [facility] [email or web address] 09/05/2022, 12:02 PM

## 2022-09-05 NOTE — Plan of Care (Signed)
  Problem: Pain Managment: Goal: General experience of comfort will improve Outcome: Progressing   Problem: Safety: Goal: Ability to remain free from injury will improve Outcome: Progressing   Problem: Skin Integrity: Goal: Risk for impaired skin integrity will decrease Outcome: Progressing   Problem: Education: Goal: Knowledge of General Education information will improve Description: Including pain rating scale, medication(s)/side effects and non-pharmacologic comfort measures Outcome: Progressing   

## 2022-09-05 NOTE — TOC Benefit Eligibility Note (Signed)
Patient Product/process development scientist completed.    The patient is insured through [place].     Ran test claim for Eliquis 5 mg and the current 30 day co-pay is $4.00.   This test claim was processed through [facility]- copay amounts may vary at other pharmacies due to pharmacy/plan contracts, or as the patient moves through the different stages of their insurance plan.     Roland Earl, CPHT Pharmacy Patient Advocate Specialist [facility] Patient Advocate Team Direct Number: [phone]  Fax: [phone]

## 2022-09-05 NOTE — Plan of Care (Signed)
  Problem: Education: Goal: Knowledge of General Education information will improve Description Including pain rating scale, medication(s)/side effects and non-pharmacologic comfort measures Outcome: Progressing   Problem: Clinical Measurements: Goal: Ability to maintain clinical measurements within normal limits will improve Outcome: Progressing   Problem: Activity: Goal: Risk for activity intolerance will decrease Outcome: Progressing   Problem: Nutrition: Goal: Adequate nutrition will be maintained Outcome: Progressing   Problem: Coping: Goal: Level of anxiety will decrease Outcome: Progressing   Problem: Pain Managment: Goal: General experience of comfort will improve Outcome: Progressing   Problem: Safety: Goal: Ability to remain free from injury will improve Outcome: Progressing   

## 2022-09-05 NOTE — Progress Notes (Addendum)
[facility] Liaison Note  This is a current outpatient based palliative care patient with [facility].  [facility] hospital liaison will continue to follow for discharge disposition.   Please call for any outpatient based outpatient palliative care related questions or concerns.   Thank you.   Dionicio Stall, [facility] Liaison [phone]

## 2022-09-05 NOTE — TOC Initial Note (Addendum)
Transition of Care (TOC) - Initial/Assessment Note   Spoke to patient and mother at bedside.   Confirmed face sheet information.   PCP David [name] at Internal Medicine at [facility].   Patient gets trach supplies through [facility] [employer].   Patient active with OP Palliative Care through [facility] . David [name] with [facility] aware of admission   Patient has PCS last year through [facility], no longer has service and does not want service.   Patient active with OP PT at [facility]  Patient Details  Name: David [name] MRN: [id number] Date of Birth: 08/20/1975  Transition of Care [facility]) CM/SW Contact:    David Plan, RN Phone Number: 09/05/2022, 9:55 AM  Clinical Narrative:                   Expected Discharge [name]: Home/Self Care Barriers to Discharge: Continued Medical Work up   Patient Goals and CMS Choice Patient states their goals for this hospitalization and ongoing recovery are:: to return to home          Expected Discharge [name] and Services   Discharge Planning Services: CM Consult Post Acute Care Choice: NA                   DME Arranged: N/A DME Agency: NA       HH Arranged: NA          Prior Living Arrangements/Services   Lives with:: Self Patient language and need for interpreter reviewed:: Yes Do you feel safe going back to the place where you live?: Yes      Need for Family Participation in Patient Care: Yes (Comment) Care giver support system in place?: Yes (comment) Current home services: DME Criminal Activity/Legal Involvement Pertinent to Current Situation/Hospitalization: No - Comment as needed  Activities of Daily Living      Permission Sought/Granted   Permission granted to share information with : Yes, Verbal Permission Granted  Share Information with NAME: David [name] [phone]           Emotional Assessment Appearance:: Appears stated age Attitude/Demeanor/Rapport: Engaged Affect (typically observed):  Accepting Orientation: : Oriented to Self, Oriented to Place, Oriented to  Time, Oriented to Situation Alcohol / Substance Use: Not Applicable Psych Involvement: No (comment)  Admission diagnosis:  Facial swelling [R22.0] Jugular vein occlusion (HCC) [W09.C19] Jugular vein occlusion, right (HCC) [W11.C11] Patient Active Problem List   Diagnosis Date Noted   Jugular vein occlusion (HCC) 09/04/2022   Jugular vein occlusion, right (HCC) 09/04/2022   Torticollis 07/04/2022   Evaluation by medical service required 03/19/2022   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   Defective dental restoration 12/31/2021   Chronic periodontitis 12/31/2021   Impacted third molar tooth 12/31/2021   Irritant contact dermatitis associated with digestive stoma 12/25/2021   Irritation around percutaneous endoscopic gastrostomy (PEG) tube site (HCC) 12/25/2021   Leaking PEG tube (HCC) 12/12/2021   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   Continuous tobacco abuse 11/21/2021   Dermatitis associated with moisture 10/16/2021   Back pain 08/16/2021   Candidal endocarditis    History of radiation to head and neck region 07/11/2021   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   Dysgeusia 07/11/2021   S/P percutaneous endoscopic gastrostomy (PEG) tube placement (HCC) 06/24/2021   Pressure injury of skin 06/17/2021   Tracheostomy status (HCC)    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   Heart failure with reduced ejection fraction (HCC) 04/28/2021   Elevated troponin 04/01/2021  Elevated transaminase level 04/01/2021   Weight loss, unintentional 01/15/2021   Protein-calorie malnutrition, severe (HCC) 01/10/2021   Hypotension 01/08/2021   AKI (acute kidney injury) (HCC) 01/08/2021   Chemotherapy induced nausea and vomiting 01/01/2021   Encounter for dental examination 12/13/2020   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 [place] [address] Phone: [phone] Fax: [phone]  [id number] [place], [place] - [address] [facility] [address] [place] [address] Phone: [phone] Fax: [phone]  [facility] [address] [place] [address] Phone: [phone] Fax: [phone]     Social Determinants of Health (SDOH) Social History: SDOH Screenings   Food Insecurity: No Food Insecurity (03/19/2022)  Housing: Low Risk  (03/19/2022)  Transportation Needs: No Transportation Needs (03/19/2022)  Utilities: Not At Risk (03/19/2022)  Alcohol Screen: Low Risk  (03/19/2022)  Depression (PHQ2-9): Low Risk  (05/09/2022)  Financial Resource Strain: Low Risk  (03/19/2022)  Physical Activity: Insufficiently Active (03/19/2022)  Social Connections: Socially Isolated (03/19/2022)  Stress: No Stress Concern Present (03/19/2022)  Tobacco Use: High Risk (09/02/2022)   SDOH Interventions:     Readmission Risk Interventions     No data to display

## 2022-09-05 NOTE — Consult Note (Signed)
Vascular and Vein Specialist of [facility]  Patient name: David [name] MRN: [id number] DOB: 1975/02/07 Sex: male   REQUESTING PROVIDER:    Hospital Service   REASON FOR CONSULT:    Internal jugular DVT  HISTORY OF PRESENT ILLNESS:   David [name] is a 47 y.o. male, who presented to the emergency department on 09/04/2018 for after waking up with swelling around his right eye.  He went to the emergency department and had a CT scan that shows a right internal jugular vein occlusion.  The patient has a history of stage IV head and neck cancer.  He is status posttreatment with chemotherapy and radiation.  Chemotherapy was completed and 2023.  He had a tracheostomy in May 2023.  He has a tracheostomy.  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  He continues to smoke.  He does have a history of spontaneous pneumothorax 20 years ago.  He denies any shortness of breath or extremity swelling.  He feels his swelling is better this am  PAST MEDICAL HISTORY    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)    Pneumococcal bacteremia 06/09/2021   Pneumothorax    Left lung spontaneous pneumothorax at age 65 yr      FAMILY HISTORY   No family history on file.  SOCIAL HISTORY:   Social History   Socioeconomic History   Marital status: Divorced    Spouse name: Not on file   Number of children: Not on file   Years of education: Not on file   Highest education level: Not on file  Occupational History   Not on file  Tobacco Use   Smoking status: Every Day    Current packs/day:  3.00    Types: Cigarettes   Smokeless tobacco: Never   Tobacco comments:    10 cigs per day  Vaping Use   Vaping status: Never Used  Substance and Sexual Activity   Alcohol use: Not Currently    Alcohol/week: 12.0 standard drinks of alcohol    Types: 12 Cans of beer per week   Drug use: Not Currently    Types: IV    Comment: 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)   Sexual activity: Not on file  Other Topics Concern   Not on file  Social History Narrative   Not on file   Social Determinants of Health   Financial Resource Strain: Low Risk  (03/19/2022)   Overall Financial Resource Strain (CARDIA)    Difficulty of Paying Living Expenses: Not hard at all  Food Insecurity: No Food Insecurity (03/19/2022)   Hunger Vital Sign    Worried About Running Out of Food in the Last Year: Never true    Ran Out of Food in the Last Year: Never true  Transportation Needs: No Transportation Needs (03/19/2022)   PRAPARE - Administrator, Civil Service (Medical): No    Lack of Transportation (Non-Medical): No  Physical Activity: Insufficiently Active (03/19/2022)   Exercise Vital Sign    Days of Exercise per Week: 3 days    Minutes of Exercise per Session: 20 min  Stress: No Stress Concern Present (03/19/2022)   [employer] of Occupational Health - Occupational Stress Questionnaire    Feeling of Stress : Only a little  Social Connections: Socially Isolated (03/19/2022)   Social Connection and Isolation Panel [NHANES]    Frequency of Communication with Friends and Family: More than three times a week    Frequency of Social Gatherings with Friends and Family: Three times a week    Attends Religious Services: Never    Active Member of Clubs or Organizations: No    Attends Banker Meetings: Never    Marital Status: Divorced  Catering manager Violence: Not At Risk (03/19/2022)   Humiliation, Afraid, Rape, and Kick questionnaire    Fear of Current or  Ex-Partner: No    Emotionally Abused: No    Physically Abused: No    Sexually Abused: No    ALLERGIES:    No Known Allergies  CURRENT MEDICATIONS:    Current Facility-Administered Medications  Medication Dose Route Frequency Provider Last Rate Last Admin   acetaminophen (TYLENOL) tablet 650 mg  650 mg Oral Q6H PRN Willeen Niece, MD       Or   acetaminophen (TYLENOL) suppository 650 mg  650 mg Rectal Q6H PRN Willeen Niece, MD       docusate sodium (COLACE) capsule 100 mg  100 mg Oral BID Willeen Niece, MD       heparin ADULT infusion 100 units/mL (25000 units/223mL)  900 Units/hr Intravenous Continuous Willeen Niece, MD 9 mL/hr at 09/05/22 0012 900 Units/hr at 09/05/22 0012   methadone (DOLOPHINE) 10 MG/ML solution 60 mg  60 mg Oral Daily Willeen Niece, MD       midodrine (PROAMATINE) tablet 5 mg  5 mg Oral TID WC Willeen Niece, MD       ondansetron (ZOFRAN) tablet 4 mg  4 mg Oral Q6H PRN Willeen Niece, MD       Or   ondansetron (ZOFRAN) injection 4 mg  4 mg Intravenous Q6H PRN Willeen Niece, MD        REVIEW OF SYSTEMS:   [X]  denotes positive finding, [ ]  denotes negative finding Cardiac  Comments:  Chest pain or chest pressure:    Shortness of breath upon exertion:    Short of breath when lying flat:    Irregular heart rhythm:        Vascular    Pain in calf, thigh, or hip brought on by ambulation:    Pain in feet at night that wakes you up from your sleep:     Blood clot in your veins:    Leg swelling:         Pulmonary    Oxygen at home:    Productive cough:     Wheezing:         Neurologic    Sudden weakness in arms or legs:     Sudden numbness in arms or legs:     Sudden onset of difficulty speaking or slurred speech:    Temporary loss of vision in one eye:     Problems with dizziness:         Gastrointestinal    Blood in stool:      Vomited blood:         Genitourinary    Burning when urinating:     Blood in urine:         Psychiatric    Major depression:         Hematologic  Bleeding problems:    Problems with blood clotting too easily:        Skin    Rashes or ulcers:        Constitutional    Fever or chills:     PHYSICAL EXAM:   Vitals:   09/05/22 0036 09/05/22 0100 09/05/22 0456 09/05/22 0459  BP: 118/83  116/78   Pulse: 66 66 67 67  Resp: 18 13 16    Temp: 98.1 F (36.7 C)  98.5 F (36.9 C)   TempSrc: Oral  Oral   SpO2: 99% 99% 99% 99%  Weight:      Height:        GENERAL: The patient is a well-nourished male, in no acute distress. The vital signs are documented above. CARDIAC: There is a regular rate and rhythm.  PULMONARY: Nonlabored respirations ABDOMEN: Soft and non-tender   MUSCULOSKELETAL: There are no major deformities or cyanosis. NEUROLOGIC: No focal weakness or paresthesias are detected. SKIN: There are no ulcers or rashes noted. PSYCHIATRIC: The patient has a normal affect. Improved right peri-orbital and facial edema STUDIES:   I have reviewed the following CT SCAN;  1. New obstruction of the right internal jugular vein, likely secondary to a peripherally calcified and previously biopsied right level 2A lymph node, with associated extensive subcutaneous edema throughout the right face and involving the right parotid and submandibular glands. 2. Extensive dental caries.  ASSESSMENT and PLAN   Right internal jugular vein DVT: There is no role for interventional treatment at this time.  I think he is at risk for complication from a thrombectomy because of his prior radiation.  Since he has improved on IV heparin, I would recommend continuing IV heparin with plans to convert to a DOAC.   Charlena Cross, MD, FACS Vascular and Vein Specialists of [facility] [phone] Pager [phone]

## 2022-09-05 NOTE — Progress Notes (Addendum)
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  No Known Allergies  Patient Measurements: Height: 5\' 10"  (177.8 cm) Weight: 42.7 kg (94 lb 2.2 oz) IBW/kg (Calculated) : 73 Heparin Dosing Weight: 42.7kg  Vital Signs: Temp: 98.5 F (36.9 C) (08/08 0456) Temp Source: Oral (08/08 0456) BP: 116/78 (08/08 0456) Pulse Rate: 67 (08/08 0459)  Labs: Recent Labs    09/04/22 1204 09/04/22 1514 09/04/22 2144 09/04/22 2314 09/05/22 0612  HGB 11.8*  --   --  11.3* 12.5*  HCT 36.8*  --   --  34.4* 38.1*  PLT 106*  --   --  112* 85*  LABPROT  --  14.7  --   --   --   INR  --  1.1  --   --   --   HEPARINUNFRC  --   --  <0.10*  --  0.30  CREATININE 0.82  --   --   --  1.02    Estimated Creatinine Clearance: 54.1 mL/min (by C-G formula based on SCr of 1.02 mg/dL).   Assessment: 47 yo male with RI facial swelling, found to have R IJV obstruction. Vascular consult pending.   Heparin level therapeutic at 0.30 while on 900u/hr. No s/sx of bleeding noted. Plts with slight downtrend, continue to monitor closely. PLT 112 to 85  Goal of Therapy:  Heparin level 0.3-0.7 units/ml Monitor platelets by anticoagulation protocol: Yes   Plan:  Small increase to 950u/hr of heparin, patient at lower end of therapeutic.  Re-check anti-Xa in 8 hours.  Monitor for s/sx of bleeding.   Estill Batten, PharmD, BCCCP  09/05/2022,7:48 AM  Addendum:   Consult to switch to Eliquis per pharmacy. Will load with 10mg  BID x7 days followed by 5mg  BID. Continue to monitor platelets closely for s/sx of bleeding.   Estill Batten, PharmD, BCCCP

## 2022-09-06 ENCOUNTER — Other Ambulatory Visit (HOSPITAL_COMMUNITY): Payer: Self-pay

## 2022-09-06 DIAGNOSIS — F17210 Nicotine dependence, cigarettes, uncomplicated: Secondary | ICD-10-CM | POA: Diagnosis not present

## 2022-09-06 DIAGNOSIS — I82C11 Acute embolism and thrombosis of right internal jugular vein: Principal | ICD-10-CM

## 2022-09-06 MED ORDER — HYDROXYZINE HCL 10 MG/5ML PO SYRP: 10.0000 mg | ORAL_SOLUTION | Freq: Every evening | ORAL | Status: DC | PRN

## 2022-09-06 MED ORDER — HYDROXYZINE HCL 10 MG/5ML PO SYRP
10.0000 mg | ORAL_SOLUTION | Freq: Every evening | ORAL | 0 refills | Status: DC | PRN
  Filled 2022-09-06: qty 118, 24d supply, fill #0

## 2022-09-06 MED ORDER — SERTRALINE HCL 20 MG/ML PO CONC
25.0000 mg | Freq: Every day | ORAL | Status: DC
  Administered 2022-09-06: 25 mg via ORAL
  Filled 2022-09-06: qty 1.25

## 2022-09-06 MED ORDER — SERTRALINE HCL 20 MG/ML PO CONC
25.0000 mg | Freq: Every day | ORAL | 2 refills | Status: DC
Start: 2022-09-06 — End: 2023-03-14
  Filled 2022-09-06: qty 60, 48d supply, fill #0

## 2022-09-06 MED ORDER — PREGABALIN 20 MG/ML PO SOLN: 40.0000 mg | Freq: Three times a day (TID) | ORAL | Status: DC

## 2022-09-06 MED ORDER — FLUCONAZOLE 40 MG/ML PO SUSR
800.0000 mg | Freq: Every day | ORAL | Status: DC
  Administered 2022-09-06: 800 mg via ORAL
  Filled 2022-09-06: qty 20

## 2022-09-06 MED ORDER — FLUCONAZOLE 200 MG PO TABS
800.0000 mg | ORAL_TABLET | Freq: Every day | ORAL | 0 refills | Status: DC
Start: 2022-09-06 — End: 2022-09-13
  Filled 2022-09-06: qty 20, 5d supply, fill #0

## 2022-09-06 MED ORDER — APIXABAN 5 MG PO TABS
ORAL_TABLET | ORAL | 11 refills | Status: DC
Start: 2022-09-06 — End: 2022-09-13
  Filled 2022-09-06: qty 72, 30d supply, fill #0

## 2022-09-06 MED ORDER — FLUCONAZOLE 100 MG PO TABS: 800.0000 mg | ORAL_TABLET | Freq: Every day | ORAL | Status: DC

## 2022-09-06 NOTE — Procedures (Signed)
Tracheostomy tube change: Informed verbal consent was obtained after explaining the risks (including bleeding and infection), benefits and alternatives of the procedure. Verbal timeout was performed prior to the procedure. The old  #4 cuffless trach was carefully removed. the tracheostomy site appeared: unremarkable. A new #  Cuffless trach was easily placed in the tracheostomy stoma and secured with velcro trach ties. The tracheostomy was patent, good color change observed via EZ-CAP, and the patient was easily able to voice with finger occlusion and tolerated the procedure well with no immediate complications.   Simonne Martinet ACNP-BC [facility] Pulmonary/Critical Care Pager # [phone] OR # [phone] if no answer

## 2022-09-06 NOTE — Progress Notes (Signed)
MEDICATION RELATED CONSULT NOTE - INITIAL   Pharmacy Consult for patient teaching for apixaban/ELIQUIS Indication: Internal jugular vein thrombosis  No Known Allergies   Medications:  Apixaban/Eliquis  Assessment: Treatment of IJV thrombosis, right side.  Plan:  Patient education provided, written materials from CH[facility]/EPIC and UpToDate provided to patient and his mom. They were given an opportunity to ask and have answered  any questions.   Elicia Lamp, PhamD, CPP 09/06/2022,11:07 AM

## 2022-09-06 NOTE — Discharge Summary (Addendum)
Name: David [name] MRN: [id number] DOB: 21-Jul-1975 47 y.o. PCP: Faith Rogue, DO  Date of Admission: 09/04/2022 11:53 AM Date of Discharge: 09/06/2022 Attending Physician: Dr. Cleda Daub  Discharge Diagnosis: Principal Problem:   Jugular vein occlusion, right (HCC) Active Problems:   Laryngeal cancer (HCC)   Hypotension   Protein-calorie malnutrition, severe (HCC)   Tracheostomy status (HCC)   Jugular vein occlusion (HCC)   Jugular vein thrombosis, right    Discharge Medications: Allergies as of 09/06/2022   No Known Allergies      Medication List     STOP taking these medications    lidocaine 5 % Commonly known as: LIDODERM   midodrine 5 MG tablet Commonly known as: PROAMATINE   ondansetron 8 MG tablet Commonly known as: ZOFRAN       TAKE these medications    apixaban 5 MG Tabs tablet Commonly known as: ELIQUIS Take 2 tablets (10mg ) two times daily until 08/14. On 08/15, start taking  1 tablet (5mg ) two times daily. You will be on this medication long term.   fluconazole 200 MG tablet Commonly known as: DIFLUCAN Take 4 tablets (800 mg total) by mouth daily.   hydrOXYzine 10 MG/5ML syrup Commonly known as: ATARAX Take 5 mLs (10 mg total) by mouth at bedtime as needed for anxiety.   lidocaine 2 % solution Commonly known as: XYLOCAINE Patient: Mix 1part 2% viscous lidocaine, 1part H20. Swish and spit 10mL of diluted mixture up to eight times a day, prn soreness What changed:  how much to take how to take this when to take this additional instructions   methadone 10 MG/ML solution Commonly known as: DOLOPHINE Take 6 mLs (60 mg total) by mouth daily. What changed: how much to take   Pregabalin 20 MG/ML Soln Take 40 mg by mouth 3 (three) times daily.   sertraline 20 MG/ML concentrated solution Commonly known as: ZOLOFT Mix 1.25 mLs (25 mg total)  with 1/2 cup of water and take by mouth daily.        Disposition and follow-up:    [name] was discharged from [facility] in Stable condition.  At the hospital follow up visit please address:  1.  Follow-up: a. Swelling in right face- occlusion of right IJV. No a good candidate for thrombectomy with prio radiation. Started on DOAC at discharge. Ensure he is taking as prescribed. 10 mg BID until 08/14 and then switch to 5 mg BID 08/15. This will be long term medication for him.   b. Anxiety- started on SSRI liquid in hospital. He was also prescribed PRN hydroxyzine. He would likely benefit from counseling as well if he is open to this. Check EKG with QTC while on methadone, fluconazole and starting SSRI.  C. Hx of candidal endocarditis- some confusion with dosing for this. Last ID note indicated fluconazole 800 mg liquid but he has been taking tablets at 400 dose. He has follow-up with Dr. Renold Don soon.  Lawernce Keas DME needs. Follows with trach clinic. He has had trouble with getting trach supplies from [employer] recently. Please ensure that issue was resolved at follow-up.  E. Pancytopenia- repeat CBC and hepatitis C RNA at discharge. Unclear if chronic hepatitis C was addressed as outpatient.  4.  Medication Changes Eliquis 10 mg BID until 08/14 then 5 mg BID long term Sertraline 25 mg liquid PO Hydroxyzine 10 mg PRN  Follow-up Appointments: Dr. Renold Don 08/13 Dr. Hessie Diener 08/15  Hospital Course by problem list: Right Internal  Jugular Vein Occlusion Patient presented with worsening of right sided facial swelling. CT showed occlusion of internal jugular vein. With his history of radiation he is high risk for thrombectomy due to friability of tissue so he was started on DOAC.  Hx of SCC of neck S/p tracheostomy Diagnosed in 2022. He underwent chemo and radiation. [name] has been in place since 5/23. Currently in remission.  History of candida endocarditis He follows with ID, next visit on 08/13. From last ID OV he was to be taking fluconazole 800 mg  with plans to decrease dosing. He has been taking PO fluconazole 400 mg. I discussed with him about increasing dose until he follows up with ID next week. Likely that outpatient pharmacy did not have enough liquid to supply him with.  Pancytopenia New during admission. Tech smear added. No signs of infection. On chart review he did have chronic hepatitis C, I do not see that this was addressed as outpatient.  Anxiety He was started on valium in the hospital as he has anxiety. He was open to starting SSRI at discharge. He has trouble swallowing so he will take liquid version. I also sent in hydroxyzine PRN for him.  Chronic pain syndrome Follows with [facility]. Home dose is methadone 120 mg.   Discharge Subjective: Patient evaluated at bedside this AM. He is feeling better. He has some swelling since receiving radiation and reports that current swelling is back to baseline.   Discharge Exam:   BP 103/83 (BP Location: Left Arm)   Pulse 62   Temp 98.1 F (36.7 C) (Oral)   Resp 18   Ht 5\' 10"  (1.778 m)   Wt 42.7 kg   SpO2 99%   BMI 13.51 kg/m  Constitutional: well-appearing, in no acute distress, thin HENT: swelling to right side of face up to eye, no tenderness present, tracheostomy in place Cardiovascular: regular rate and rhythm, no m/r/g Pulmonary/Chest: normal work of breathing on room air, lungs clear to auscultation bilaterally Abdominal: soft, non-tender, non-distended Neurological: alert & oriented x 3  Pertinent Labs, Studies, and Procedures:     Latest Ref Rng & Units 09/06/2022    1:43 AM 09/05/2022    6:12 AM 09/04/2022   11:14 PM  CBC  WBC 4.0 - 10.5 K/uL 3.6  3.1  3.1   Hemoglobin 13.0 - 17.0 g/dL 65.7  84.6  96.2   Hematocrit 39.0 - 52.0 % 34.7  38.1  34.4   Platelets 150 - 400 K/uL 134  85  112        Latest Ref Rng & Units 09/05/2022    6:12 AM 09/04/2022   12:04 PM 02/06/2022    2:53 PM  CMP  Glucose 70 - 99 mg/dL 78  952  841   BUN 6 - 20  mg/dL 6  11  17    Creatinine 0.61 - 1.24 mg/dL 3.24  4.01  0.27   Sodium 135 - 145 mmol/L 135  135  139   Potassium 3.5 - 5.1 mmol/L 3.9  4.0  4.4   Chloride 98 - 111 mmol/L 99  102  103   CO2 22 - 32 mmol/L 29  26  24    Calcium 8.9 - 10.3 mg/dL 8.7  8.5  9.1   Total Protein 6.5 - 8.1 g/dL 6.3   7.7   Total Bilirubin 0.3 - 1.2 mg/dL 0.5   0.3   Alkaline Phos 38 - 126 U/L 57     AST 15 -  41 U/L 47   51   ALT 0 - 44 U/L 34   51     CT Maxillofacial W Contrast  Addendum Date: 09/04/2022   ADDENDUM REPORT: 09/04/2022 15:03 ADDENDUM: CORRECTION: Occlusion of the RIGHT internal jugular vein, not the left. Electronically Signed   By: Orvan Falconer M.D.   On: 09/04/2022 15:03   Result Date: 09/04/2022 CLINICAL DATA:  Right-sided facial swelling. Right upper eyelid swelling. EXAM: CT MAXILLOFACIAL WITH CONTRAST TECHNIQUE: Multidetector CT imaging of the maxillofacial structures was performed with intravenous contrast. Multiplanar CT image reconstructions were also generated. RADIATION DOSE REDUCTION: This exam was performed according to the departmental dose-optimization program which includes automated exposure control, adjustment of the mA and/or kV according to patient size and/or use of iterative reconstruction technique. CONTRAST:  75mL OMNIPAQUE IOHEXOL 300 MG/ML  SOLN COMPARISON:  Neck CT 08/11/2021. Ultrasound-guided cervical lymph node biopsy 09/14/2021. FINDINGS: Osseous: No fracture or mandibular dislocation. Extensive dental caries. Orbits: Negative. No traumatic or inflammatory finding. Sinuses: Mild mucosal disease of the right maxillary sinus. Soft tissues: The previously biopsied, peripherally calcified right level 2A lymph node is unchanged in size, measuring up to 31 x 30 mm (sagittal image 25 series 7). New obstruction of the left internal jugular vein (sagittal image 26 series 7) with associated extensive subcutaneous edema throughout the right face and involving the right parotid and  submandibular glands. Limited intracranial: Unremarkable. IMPRESSION: 1. 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 2. Extensive dental caries. Electronically Signed: By: Orvan Falconer M.D. On: 09/04/2022 14:50     Discharge Instructions: Discharge Instructions     Diet - low sodium heart healthy   Complete by: As directed    Discharge instructions   Complete by: As directed    Shary Key were recently admitted to [facility] for blood clot in a vein in your neck.   Continue taking your home medications with the following changes  Start: 1. Eliquis 10 mg, 2 times daily until 08/14, then switch to 5 mg, 2 times daily 2. Sertraline 25 mg daily 3. Hydroxyzine 25 mg as needed  Please follow-up with Dr. Hessie Diener next week. We are so glad that you are feeling better.  Sincerely, Rudene Christians, DO   Increase activity slowly   Complete by: As directed        Signed: Rudene Christians, DO 09/06/2022, 12:09 PM   Pager: [phone]

## 2022-09-06 NOTE — Hospital Course (Addendum)
Right Internal Jugular Vein Occlusion Patient presented with worsening of right sided facial swelling. CT showed occlusion of internal jugular vein. With his history of radiation he is high risk for thrombectomy due to friability of tissue so he was started on DOAC.  Hx of SCC of neck S/p tracheostomy Diagnosed in 2022. He underwent chemo and radiation. [name] has been in place since 5/23. Currently in remission.  History of candida endocarditis He follows with ID, next visit on 08/13. From last ID OV he was to be taking fluconazole 800 mg with plans to decrease dosing. He has been taking PO fluconazole 400 mg. I discussed with him about increasing dose until he follows up with ID next week. Likely that outpatient pharmacy did not have enough liquid to supply him with.  Pancytopenia New during admission. Tech smear added. No signs of infection. On chart review he did have chronic hepatitis C, I do not see that this was addressed as outpatient.  Anxiety He was started on valium in the hospital as he has anxiety. He was open to starting SSRI at discharge. He has trouble swallowing so he will take liquid version. I also sent in hydroxyzine PRN for him.  Chronic pain syndrome Follows with [facility]. Home dose is methadone 120 mg.

## 2022-09-09 ENCOUNTER — Telehealth: Payer: Self-pay

## 2022-09-09 NOTE — Progress Notes (Unsigned)
Palliative Medicine [facility]  [phone] Fax:([phone]   Name: David [name] Date: 09/09/2022 MRN: [id number]  DOB: December 28, 1975  Patient Care Team: Faith Rogue, DO as PCP - General [name], Lise Auer, RN as Oncology Nurse Navigator [name], Skeet Latch, DO as Consulting Physician (Otolaryngology) Lonie Peak, MD as Consulting Physician (Radiation Oncology) Rachel Moulds, MD as Consulting Physician (Hematology and Oncology) Pickenpack-Cousar, Arty Baumgartner, NP as Nurse Practitioner (Nurse Practitioner)    REASON FOR CONSULTATION: David [name] is a 47 y.o. 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 He was recently hospitalized for several months at [facility] where he received treatment for fungal endocarditis and required emergent tracheostomy and PEG tube. During hospitalization CT of chest showed concerns for residual vs recurrent tumor. Palliative requested to re-engage for ongoing support and goals of care.    SOCIAL HISTORY:     reports that he has been smoking cigarettes. He has never used smokeless tobacco. He reports that he does not currently use alcohol after a past usage of about 12.0 standard drinks of alcohol per week. He reports that he does not currently use drugs after having used the following drugs: IV.  ADVANCE DIRECTIVES:  Patient reports he does not have advanced directives.  Would like to further discuss in the future.  CODE STATUS: Full code  PAST MEDICAL HISTORY: Past Medical History:  Diagnosis Date   Acute respiratory failure with hypoxia (HCC)    Aspiration pneumonia of right lower lobe (HCC) 06/06/2021   Bacterial endocarditis    Chronic HFrEF (heart failure with reduced ejection  fraction) (HCC) 06/09/2021   Community acquired pneumonia due to Pneumococcus (HCC) 04/01/2021   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HCC)    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)    Pneumococcal bacteremia 06/09/2021   Pneumothorax    Left lung spontaneous pneumothorax at age 47 yr     PAST SURGICAL HISTORY:  Past Surgical History:  Procedure Laterality Date   chest tubes Left    IR GASTROSTOMY TUBE MOD SED  06/18/2021   LAPAROSCOPIC INSERTION GASTROSTOMY TUBE N/A 06/20/2021   Procedure: LAPAROSCOPIC INSERTION GASTROSTOMY TUBE;  Surgeon: Berna Bue, MD;  Location: [facility] OR;  Service: General;  Laterality: N/A;  DOW PATIENT   TRACHEOSTOMY TUBE PLACEMENT N/A 06/09/2021   Procedure: AWAKE TRACHEOSTOMY;  Surgeon: Christia Reading, MD;  Location: [facility] OR;  Service: ENT;  Laterality: N/A;      ALLERGIES:  has No Known Allergies.  MEDICATIONS:  Current Outpatient Medications  Medication Sig Dispense Refill   apixaban (ELIQUIS) 5 MG TABS tablet Take 2 tablets (10mg ) two times daily until 08/14. On 08/15, start taking  1 tablet (5mg ) two times daily. You will be on this medication long term. 72 tablet 11   fluconazole (DIFLUCAN) 200 MG tablet Take 4 tablets (800 mg total) by mouth daily. 20 tablet 0   hydrOXYzine (ATARAX) 10 MG/5ML syrup Take 5 mLs (10 mg total) by mouth at bedtime as needed for anxiety. 118 mL 0   lidocaine (XYLOCAINE) 2 % solution Patient: Mix 1part 2% viscous lidocaine, 1part H20. Swish and spit 10mL of diluted mixture up to eight times a day, prn soreness (Patient taking differently: Use as directed 15 mLs in the mouth or throat daily.) 200 mL 5   methadone (DOLOPHINE) 10 MG/ML solution Take  6 mLs (60 mg total) by mouth daily. (Patient taking differently: Take 120 mg by mouth daily.) 90 mL 0   Pregabalin 20 MG/ML SOLN Take 40 mg by mouth 3 (three) times daily. 120 mL 1   sertraline (ZOLOFT) 20 MG/ML concentrated solution Mix 1.25 mLs (25 mg total)  with 1/2 cup of water and take  by mouth daily. 60 mL 2   No current facility-administered medications for this visit.    VITAL SIGNS: There were no vitals taken for this visit. There were no vitals filed for this visit.     Estimated body mass index is 13.51 kg/m as calculated from the following:   Height as of 09/04/22: 5\' 10"  (1.778 m).   Weight as of 09/04/22: 94 lb 2.2 oz (42.7 kg).  PERFORMANCE STATUS (ECOG) : 1 - Symptomatic but completely ambulatory  Physical Exam General: NAD HEENT: Trach/Passy Muir valve in place, neck contracture, right facial edema. redness Cardiovascular: regular rate and rhythm Pulmonary: normal breathing pattern  Abdomen: soft, tender, + bowel sounds Extremities: no edema, some head contracture to right side  Neurological: AAOx3, mood appropriate   IMPRESSION: David [name] presents to clinic for follow-up. Recent hospitalization where he was found to have a right jugular vein clot. Discharged home on Eliquis. He is taking as prescribed. Denies any active bleeding, nausea, vomiting, constipation, or diarrhea. Right neck and facial discomfort. He has quite a bit of right facial redness and edema however reports this has been present with some increase in edema since discharge. No changes to vision. He has follow-up with PCP who also managed his care inpatient.   David [name] continues to follow-up as expected with [facility] for his methadone management.  During hospitalization he was started on hydroxyzine at bedtime for anxiety in addition to Zoloft daily.  Tolerating without difficulty.  Pain is well-controlled.  Tolerating pregabalin 3 times daily.  Will continue to closely monitor and support as needed.  PLAN:  Emotional support. Lidocaine patch to back  Pregabalin 30 mg 3 times daily  I will plan to see patient back in the clinic in 4-6 weeks in collaboration with his other oncology appointments.   Patient and mother expressed understanding and was in agreement with this plan. He  also understands that He can call the clinic at any time with any questions, concerns, or complaints.        Any controlled substances utilized were prescribed in the context of palliative care. PDMP has been reviewed.    Visit consisted of counseling and education dealing with the complex and emotionally intense issues of symptom management and palliative care in the setting of serious and potentially life-threatening illness.Greater than 50%  of this time was spent counseling and coordinating care related to the above assessment and plan.  Willette Alma, AGPCNP-BC  Palliative Medicine Team/[facility]  *Please note that this is a verbal dictation therefore any spelling or grammatical errors are due to the "Dragon [employer] One" system interpretation.

## 2022-09-09 NOTE — Transitions of Care (Post Inpatient/ED Visit) (Signed)
09/09/2022  Name: David [name] MRN: [id number] DOB: Apr 26, 1975  Today's TOC FU Call Status: Today's TOC FU Call Status:: Successful TOC FU Call Completed TOC FU Call Complete Date: 09/09/22  Transition Care Management Follow-up Telephone Call Date of Discharge: 09/06/22 Discharge Facility: [facility]) Type of Discharge: Inpatient Admission How have you been since you were released from the hospital?: Better Any questions or concerns?: No  Items Reviewed: Did you receive and understand the discharge instructions provided?: No Medications obtained,verified, and reconciled?: Yes (Medications Reviewed) Any new allergies since your discharge?: No Dietary orders reviewed?: Yes Do you have support at home?: No  Medications Reviewed Today: Medications Reviewed Today     Reviewed by Karena Addison, LPN (Licensed Practical Nurse) on 09/09/22 at 1231  Med List Status: <None>   Medication Order Taking? Sig Documenting Provider Last Dose Status Informant  apixaban (ELIQUIS) 5 MG TABS tablet [id number]  Take 2 tablets (10mg ) two times daily until 08/14. On 08/15, start taking  1 tablet (5mg ) two times daily. You will be on this medication long term. Masters, Florentina Addison, DO  Active   fluconazole (DIFLUCAN) 200 MG tablet [id number]  Take 4 tablets (800 mg total) by mouth daily. Masters, Florentina Addison, DO  Active   hydrOXYzine (ATARAX) 10 MG/5ML syrup [id number]  Take 5 mLs (10 mg total) by mouth at bedtime as needed for anxiety. Masters, Florentina Addison, DO  Active   lidocaine (XYLOCAINE) 2 % solution [id number] No Patient: Mix 1part 2% viscous lidocaine, 1part H20. Swish and spit 10mL of diluted mixture up to eight times a day, prn soreness  Patient taking differently: Use as directed 15 mLs in the mouth or throat daily.   Glee Arvin, NP 09/03/2022 Active Self, Pharmacy Records           Med Note ([place], Genoveva Ill   Wed Sep 04, 2022  8:10 PM) Patient states he takes every day    methadone (DOLOPHINE) 10 MG/ML solution [id number] No Take 6 mLs (60 mg total) by mouth daily.  Patient taking differently: Take 120 mg by mouth daily.   Glee Arvin, NP 09/04/2022 Active Self, Pharmacy Records           Med Note Erin Fulling, MEGAN   Thu Sep 05, 2022  7:39 AM) Benetta Spar, RN at [facility] verified dosage 120mg  daily last dose 09/04/22.  Pregabalin 20 MG/ML SOLN [id number] No Take 40 mg by mouth 3 (three) times daily. Pickenpack-Cousar, Arty Baumgartner, NP Past Week Active Self, Pharmacy Records           Med Note ([place], Genoveva Ill   Wed Sep 04, 2022  8:11 PM) Patient verified dose is correct   sertraline (ZOLOFT) 20 MG/ML concentrated solution [id number]  Mix 1.25 mLs (25 mg total)  with 1/2 cup of water and take by mouth daily. Masters, Florentina Addison, DO  Active   Med List Note Erin Fulling, [place], CPhT 09/05/22 0730): [facility] [phone]            Home Care and Equipment/Supplies: Were Home Health Services Ordered?: NA Any new equipment or medical supplies ordered?: NA  Functional Questionnaire: Do you need assistance with bathing/showering or dressing?: No Do you need assistance with meal preparation?: No Do you need assistance with eating?: No Do you have difficulty maintaining continence: No Do you need assistance with getting out of bed/getting out of a chair/moving?: No Do you have difficulty managing or taking your medications?: No  Follow up appointments reviewed: PCP Follow-up appointment  confirmed?: Yes Date of PCP follow-up appointment?: 09/12/22 Follow-up Provider: [facility] Follow-up appointment confirmed?: NA Do you need transportation to your follow-up appointment?: No Do you understand care options if your condition(s) worsen?: Yes-patient verbalized understanding    SIGNATURE Karena Addison, LPN [facility] Nurse Health Advisor Direct Dial [phone]

## 2022-09-10 ENCOUNTER — Telehealth: Payer: Self-pay

## 2022-09-10 ENCOUNTER — Encounter: Payer: Self-pay | Admitting: Internal Medicine

## 2022-09-10 ENCOUNTER — Ambulatory Visit: Payer: MEDICAID | Admitting: Internal Medicine

## 2022-09-10 ENCOUNTER — Other Ambulatory Visit: Payer: Self-pay

## 2022-09-10 VITALS — BP 91/57 | HR 97 | Temp 98.1°F | Wt 104.0 lb

## 2022-09-10 DIAGNOSIS — I82409 Acute embolism and thrombosis of unspecified deep veins of unspecified lower extremity: Secondary | ICD-10-CM | POA: Diagnosis not present

## 2022-09-10 DIAGNOSIS — I8290 Acute embolism and thrombosis of unspecified vein: Secondary | ICD-10-CM

## 2022-09-10 DIAGNOSIS — I38 Endocarditis, valve unspecified: Secondary | ICD-10-CM | POA: Diagnosis not present

## 2022-09-10 NOTE — Patient Instructions (Addendum)
Take fluconazole 400 mg once a day (if you take liquid, please calculate from its concentration)    Check echo, if no vegetation seen, we can discuss potentially trying to go off fluconazole next year    See me in 8 weeks   Discuss with your cancer doctor to see if you need to monitor level of eliquis as the fluconazole can increase its bleeding risk

## 2022-09-10 NOTE — Progress Notes (Signed)
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   Defective dental restoration 12/31/2021   Chronic periodontitis 12/31/2021   Impacted third molar tooth 12/31/2021   Irritant contact dermatitis associated with digestive stoma 12/25/2021   Irritation around percutaneous endoscopic gastrostomy (PEG) tube site (HCC) 12/25/2021   Leaking PEG tube (HCC) 12/12/2021   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   Continuous tobacco abuse 11/21/2021   Dermatitis associated with moisture 10/16/2021   Back pain 08/16/2021   Candidal endocarditis    History of radiation to head and neck region 07/11/2021   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   Dysgeusia 07/11/2021   S/P percutaneous endoscopic gastrostomy (PEG) tube placement (HCC) 06/24/2021   Pressure injury of skin 06/17/2021   Tracheostomy status (HCC)    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   Heart failure with reduced ejection fraction (HCC) 04/28/2021   Elevated troponin 04/01/2021   Elevated transaminase level 04/01/2021   Weight loss, unintentional 01/15/2021   Protein-calorie malnutrition, severe (HCC) 01/10/2021   Hypotension 01/08/2021   AKI (acute kidney injury) (HCC) 01/08/2021   Chemotherapy induced nausea and vomiting 01/01/2021   Encounter for dental examination 12/13/2020   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   MRN:  [id number]  No chief complaint on file.   HPI:  David [name] is a 47 y.o. 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 Missed dose yesterday as out of med No side effect No fever, chill, rash  He is in in active f/u with [facility] for what could possibly be recurrent head/neck cancer. He has been in remission 3-4 months  He is being followed by palliative care team and is asking me if I can do with his chronic back pain/trach site pain  ----------------- 11/07/21 id clinic f/u Patient feeling well He has recent pet scan [facility] and this demonstrate some concerning lesion in his throat and this awaits biopsy. He takes 800 mg fluconazole daily and tolerates it well Again at this time no port/central catheter  No hair loss, n/v, rash, ruq pain, jaundice   02/06/22 id f/u Tolerating fluconazole, down to 400 mg once a day, compliant Missing 200 mg here and there sometimes due to size/nausea Otherwise no issue Wants handicap placcard application filled out   05/09/22 id f/u Doing well on fluconazole 400 mg daily No hair loss No active issue going on outside of "trach being annoying."  09/10/22 id f/u Recent admission right face swelling and right internal jugular thrombosis started on doac Discharged on 800 mg fluconazole instead of 400 mg Tolerating fluconazole No bleeding  No Known Allergies    Outpatient Medications Prior to Visit  Medication Sig Dispense Refill   apixaban (ELIQUIS) 5 MG TABS tablet Take 2 tablets (10mg ) two times daily until 08/14. On 08/15, start taking  1 tablet (5mg ) two times daily. You will be on this medication long term. 72 tablet 11   fluconazole (DIFLUCAN) 200 MG tablet Take 4 tablets (800  mg total) by mouth daily. 20 tablet 0   hydrOXYzine (ATARAX) 10 MG/5ML syrup Take 5 mLs (10 mg total) by mouth at bedtime as needed for anxiety. 118 mL 0   lidocaine (XYLOCAINE) 2 % solution Patient: Mix 1part 2% viscous lidocaine, 1part H20. Swish and spit 10mL of diluted mixture up to eight times a day, prn soreness (Patient taking differently: Use as directed 15 mLs in the mouth or throat daily.) 200 mL 5   methadone (DOLOPHINE) 10 MG/ML solution Take 6 mLs (60 mg total) by mouth daily. (Patient taking differently: Take 120 mg by mouth daily.) 90 mL 0   Pregabalin 20 MG/ML SOLN Take 40 mg by mouth 3 (three) times daily. 120 mL 1   sertraline (ZOLOFT) 20 MG/ML concentrated solution Mix 1.25 mLs (25 mg total)  with 1/2 cup of water and take by mouth daily. 60 mL 2   No facility-administered medications prior to visit.     Social History   Socioeconomic History   Marital status: Divorced    Spouse name: Not on file   Number of children: Not on file   Years of education: Not on file   Highest education level: Not on file  Occupational History   Not on file  Tobacco Use   Smoking status: Every Day    Current packs/day: 3.00    Types: Cigarettes   Smokeless tobacco: Never   Tobacco comments:    10 cigs per day  Vaping Use   Vaping status: Never Used  Substance and Sexual Activity   Alcohol use: Not Currently    Alcohol/week: 12.0 standard drinks of alcohol    Types: 12 Cans of beer per week   Drug use: Not Currently    Types: IV    Comment: 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)   Sexual activity: Not on file  Other Topics Concern   Not on file  Social History Narrative   Not on file   Social Determinants of Health   Financial Resource Strain: Low Risk  (03/19/2022)   Overall Financial Resource Strain (CARDIA)    Difficulty of Paying Living Expenses: Not hard at all  Food Insecurity: No Food Insecurity (03/19/2022)   Hunger Vital Sign    Worried About Running  Out of Food in the Last Year: Never true    Ran Out of Food in the Last Year: Never true  Transportation Needs: No Transportation Needs (03/19/2022)   PRAPARE - Administrator, Civil Service (Medical): No    Lack of Transportation (Non-Medical): No  Physical Activity: Insufficiently Active (03/19/2022)   Exercise Vital Sign    Days of Exercise per Week: 3 days    Minutes of Exercise per Session: 20 min  Stress: No Stress Concern Present (03/19/2022)   [employer] of Occupational Health - Occupational Stress Questionnaire    Feeling of Stress : Only a little  Social Connections: Socially Isolated (03/19/2022)   Social Connection and Isolation Panel [NHANES]    Frequency of Communication with Friends and Family: More than three times a week    Frequency of Social Gatherings with Friends and Family: Three times a  week    Attends Religious Services: Never    Active Member of Clubs or Organizations: No    Attends Banker Meetings: Never    Marital Status: Divorced  Catering manager Violence: Not At Risk (03/19/2022)   Humiliation, Afraid, Rape, and Kick questionnaire    Fear of Current or Ex-Partner: No    Emotionally Abused: No    Physically Abused: No    Sexually Abused: No      Review of Systems    All other ros negative  Objective:    BP (!) 91/57   Pulse 97   Temp 98.1 F (36.7 C) (Oral)   Wt 104 lb (47.2 kg)   SpO2 95%   BMI 14.92 kg/m  Nursing note and vital signs reviewed.  Physical Exam     Blood pressure (!) 91/57, pulse 97, temperature 98.1 F (36.7 C), temperature source Oral, weight 104 lb (47.2 kg), SpO2 95%.  General/constitutional: no distress, pleasant HEENT: Normocephalic, PER, Conj Clear, EOMI, Oropharynx clear -- right facial swelling better  Neck supple -- trach site clean no erythema/discharge CV: rrr no mrg Lungs: clear to auscultation, normal respiratory effort Abd: Soft, Nontender Ext: no edema Skin: No  Rash Neuro: nonfocal MSK: right sided flexion torticollis chronic    Labs: Lab Results  Component Value Date   WBC 3.6 (L) 09/06/2022   HGB 11.2 (L) 09/06/2022   HCT 34.7 (L) 09/06/2022   MCV 88.3 09/06/2022   PLT 134 (L) 09/06/2022   Last metabolic panel Lab Results  Component Value Date   GLUCOSE 78 09/05/2022   NA 135 09/05/2022   K 3.9 09/05/2022   CL 99 09/05/2022   CO2 29 09/05/2022   BUN 6 09/05/2022   CREATININE 1.02 09/05/2022   GFRNONAA >60 09/05/2022   CALCIUM 8.7 (L) 09/05/2022   PHOS 4.6 09/05/2022   PROT 6.3 (L) 09/05/2022   ALBUMIN 2.8 (L) 09/05/2022   BILITOT 0.5 09/05/2022   ALKPHOS 57 09/05/2022   AST 47 (H) 09/05/2022   ALT 34 09/05/2022   ANIONGAP 7 09/05/2022    Micro:  Serology:  Imaging:  Assessment & Plan:   Problem List Items Addressed This Visit   None Visit Diagnoses     Endocarditis, unspecified chronicity, unspecified endocarditis type    -  Primary   Relevant Orders   ECHOCARDIOGRAM LIMITED   Acute deep vein thrombosis (DVT) of non-extremity vein           No orders of the defined types were placed in this encounter.    Fungal tv endocarditis Candida albican Will continue his fluconazole high dose 800 mg for now. Potentially transition to lower dose at another 3-6 months No side effect at this time clinically  Labs today Liquid form fluconazole ordered for a year F/u 4-6 weeks    Ent cancer F/u [facility]  ----- 11/07/21 id assessment Doing well Pending repeat upper oral airway biopsy for pet avid lesion  Would continue high dose 12 mg/kg fluconazole for another 3 months then go to 6 mg/kg Advise patient of periodic once or twice a year ekg and discuss potential drug interaction that could make this worse   02/06/22 assessment Patient had transitioned to 400 mg daily about December 2023. No side effect He asked about if or when he could go off of fluconazole Given his age it is not unreasonable to try,  but I would not do for candida endocarditis, at least another year from now (01/2023)  He asked for handicap placcard application to be filled out today and we can. Renewal probably needs pcp fill out. Filled out for 6 months  Follow up in 3 months   05/09/22 id assessment Doing well tolerating fluconazole wihtout any side effect Plan to lower dose to 200 mg in 07/2022 and reevaluate in August Potentially trial off 01/2023  09/10/22 id clinic assessment New onset right internal jugular dvt now on eliquis. Reviewed ddi with fluconazole slight increased auc with eliquis. Advise monitoring and discuss with oncology I would continue fluconazole 400 mg daily for now Repeat tte If tte doesn't show vegetation, will consider discussing if wanting to trial off fluconazole (most take lifelong)  F/u with me in 8 weeks     Follow-up: Return in about 2 months (around 11/10/2022).      Raymondo Band, MD [facility] 09/10/2022, 11:19 AM

## 2022-09-10 NOTE — Telephone Encounter (Signed)
Per Dr. Renold Don called patient to inform him of Echocardiogram appt. Pt is scheduled for 8/27 at 11:15 at [facility]. Left voicemail with patient's mother with appt date/time.   Juanita Laster, RMA

## 2022-09-11 ENCOUNTER — Inpatient Hospital Stay: Payer: MEDICAID | Attending: Nurse Practitioner | Admitting: Nurse Practitioner

## 2022-09-11 ENCOUNTER — Encounter: Payer: Self-pay | Admitting: Nurse Practitioner

## 2022-09-11 ENCOUNTER — Ambulatory Visit: Payer: MEDICAID

## 2022-09-11 VITALS — BP 90/66 | HR 81 | Temp 97.9°F | Resp 14 | Wt 102.9 lb

## 2022-09-11 DIAGNOSIS — M542 Cervicalgia: Secondary | ICD-10-CM | POA: Diagnosis not present

## 2022-09-11 DIAGNOSIS — Z515 Encounter for palliative care: Secondary | ICD-10-CM

## 2022-09-11 DIAGNOSIS — G893 Neoplasm related pain (acute) (chronic): Secondary | ICD-10-CM | POA: Diagnosis not present

## 2022-09-11 DIAGNOSIS — C329 Malignant neoplasm of larynx, unspecified: Secondary | ICD-10-CM

## 2022-09-11 DIAGNOSIS — R293 Abnormal posture: Secondary | ICD-10-CM

## 2022-09-11 DIAGNOSIS — R5383 Other fatigue: Secondary | ICD-10-CM

## 2022-09-11 DIAGNOSIS — M6281 Muscle weakness (generalized): Secondary | ICD-10-CM

## 2022-09-11 MED ORDER — PREGABALIN 20 MG/ML PO SOLN
40.0000 mg | Freq: Three times a day (TID) | ORAL | 1 refills | Status: DC
Start: 2022-09-11 — End: 2022-10-23

## 2022-09-11 NOTE — Therapy (Signed)
OUTPATIENT PHYSICAL THERAPY CERVICAL TREATMENT   Patient Name: David [name] MRN: [id number] DOB:July 11, 1975, 47 y.o., male Today's Date: 09/11/2022  END OF SESSION:  PT End of Session - 09/11/22 1228     Visit Number 12    Date for PT Re-Evaluation 09/28/22    Authorization Type new insurance: [place] [employer] authorized 8 visits 08/07/22-09/28/22    Authorization - Visit Number 4    Authorization - Number of Visits 8    PT Start Time 1149    PT Stop Time 1216    PT Time Calculation (min) 27 min    Activity Tolerance Patient tolerated treatment well    Behavior During Therapy WFL for tasks assessed/performed                        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)    Pneumococcal bacteremia 06/09/2021   Pneumothorax    Left lung spontaneous pneumothorax at age 18 yr    Past Surgical History:  Procedure Laterality Date   chest tubes Left    IR GASTROSTOMY TUBE MOD SED  06/18/2021   LAPAROSCOPIC INSERTION GASTROSTOMY TUBE N/A 06/20/2021   Procedure: LAPAROSCOPIC INSERTION GASTROSTOMY TUBE;  Surgeon: Berna Bue, MD;  Location: [facility] OR;  Service: General;  Laterality: N/A;  DOW PATIENT   TRACHEOSTOMY TUBE PLACEMENT N/A 06/09/2021   Procedure: AWAKE TRACHEOSTOMY;  Surgeon: Christia Reading, MD;  Location: [facility] OR;  Service: ENT;  Laterality: N/A;   Patient Active Problem List   Diagnosis Date Noted   Jugular vein thrombosis, right 09/05/2022   Jugular vein occlusion (HCC) 09/04/2022   Jugular vein occlusion, right (HCC) 09/04/2022   Torticollis 07/04/2022   Evaluation by medical service required  03/19/2022   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   Defective dental restoration 12/31/2021   Chronic periodontitis 12/31/2021   Impacted third molar tooth 12/31/2021   Irritant contact dermatitis associated with digestive stoma 12/25/2021   Irritation around percutaneous endoscopic gastrostomy (PEG) tube site (HCC) 12/25/2021   Leaking PEG tube (HCC) 12/12/2021   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   Continuous tobacco abuse 11/21/2021   Dermatitis associated with moisture 10/16/2021   Back pain 08/16/2021   Candidal endocarditis    History of radiation to head and neck region 07/11/2021   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   Dysgeusia 07/11/2021   S/P percutaneous endoscopic gastrostomy (PEG) tube placement (HCC) 06/24/2021   Pressure injury of skin 06/17/2021   Tracheostomy status (HCC)    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   Heart failure with reduced ejection fraction (HCC) 04/28/2021   Elevated troponin 04/01/2021   Elevated transaminase level 04/01/2021   Weight loss, unintentional 01/15/2021   Protein-calorie malnutrition, severe (HCC) 01/10/2021   Hypotension 01/08/2021   AKI (acute kidney injury) (HCC) 01/08/2021   Chemotherapy induced nausea and vomiting 01/01/2021   Encounter for dental examination 12/13/2020   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   Teeth missing 12/04/2020   Radiation caries 12/04/2020   Retained tooth root 12/04/2020   Chronic apical periodontitis 12/04/2020   Accretions on teeth 12/04/2020  PCP: Adron Bene, MD   REFERRING PROVIDER: Glee Arvin, NP   REFERRING DIAG: Diagnosis Information  Diagnosis  C32.1 (ICD-10-CM) - Malignant neoplasm of supraglottis (HCC)  Z51.5 (ICD-10-CM) - Palliative care patient  Eval and treat for neck pain   THERAPY DIAG:  Cervicalgia  Abnormal posture  Muscle weakness (generalized)  Rationale for Evaluation and Treatment:  Rehabilitation  ONSET DATE: 1 year history (trach placement)  SUBJECTIVE:                                                                                                                                                                                                         SUBJECTIVE STATEMENT: Pt's mother reports that he has blood clot in the Rt side of his neck.  Saw MD yesterday and he said not to do much to the Rt side of the neck in PT.  He was in the hospital on IV blood thinners and the oral meds don't seem to be helping,    Hand dominance: Right  PERTINENT HISTORY:  Tracheostomy, radiation to neck, malignant neoplasm of supraglottis  PAIN:  Are you having pain? Yes: NPRS scale: 3/10 Pain location: Lt>Rt neck and thoracic spine  Pain description: stiff, throbbing Aggravating factors: overstretching Relieving factors: not moving   PRECAUTIONS: Other: cancer, trach  WEIGHT BEARING RESTRICTIONS: No  FALLS:  Has patient fallen in last 6 months? No  LIVING ENVIRONMENT: Lives with: lives alone Lives in: House/apartment   PLOF: Independent, walking to mailbox  PATIENT GOALS: reduce neck pain, improve mobility   NEXT MD VISIT:   OBJECTIVE:   DIAGNOSTIC FINDINGS:  None   PATIENT SURVEYS:  5/30 NDI: 12/45 NDI 11/45  COGNITION: Overall cognitive status: Within functional limits for tasks assessed  SENSATION: WFL  POSTURE: rounded shoulders, forward head, increased thoracic kyphosis, and flexed trunk   PALPATION: 5/30:  Significant adaptive shortening of Rt SCM, lengthened enough now for Pt to be able to achieve neutral posture from resting in flexion/Rt SB/Lt Rot Over-lengthened and painful Lt cervical paraspinals and upper trap secondary to tone in Rt SCM Very tender SCM beneath jaw   Significant tension and reduced muscle length in bil neck. Pain on Rt vs Lt.    CERVICAL ROM:   Active ROM A/PROM (deg) eval A/ROM A/ROM 08/07/22  Flexion Rests in  flexion Rests in  55 deg flexion, Rt SB, Lt Rot:  Able to  Right posture to 15 deg flexion Rests in 30 degrees flexion  Extension  Rests in flexion, Rt SB, Lt Rot: Able to correct to  15 deg flexion    Right lateral flexion Rests in 30 degrees   Rests in 20 degrees sidebending  Left lateral flexion 10 degrees Rt lateral flexion     Right rotation WFLs From 15 deg flexion, 45  deg  50 degrees- rests in flexion  Left rotation WFLs From 15 deg flexion, 45 deg  70 degrees left- rest in flexion    (Blank rows = not tested)  UPPER EXTREMITY ROM:  UE A/ROM is WFLs without pain  UPPER EXTREMITY MMT:  5/30 Neck strength: able to perform postural correction to near neutral and hold 36 sec x 1 reps  4+/5 bil UE strength, neck strength 4-/5 throughout   TODAY'S TREATMENT:    09/11/22: Elongation to Lt side of the neck only.  Pt practiced righting his neck to midline from the supine position No treatment to Rt side of neck today 09/02/22: Supine elongation and passive stretch to Rt neck in supine with P/ROM and overpressure Active assisted stretching supine for neck extension and Lt SB for moving toward level posture - dynamic and brief periods of static holds at near end range Trigger Point Dry-Needling  Treatment instructions: Expect mild to moderate muscle soreness. S/S of pneumothorax if dry needled over a lung field, and to seek immediate medical attention should they occur. Patient verbalized understanding of these instructions and education.  Patient Consent Given: Yes Education handout provided: Previously provided Muscles treated: bil upper trap and cervical multifidi Electrical stimulation performed: No Parameters: N/A Treatment response/outcome: twitch and elongation  Seated in chair postural righting active assisted and active 5 rounds 10 sec holds    08/21/22: Elongation and passive stretch to Rt neck in supine with P/ROM and overpressure Sidelying on Lt- elongation to Rt  cervical musculature.   08/12/22: Attempted positions to begin treatment and ultimately Pt stated he didn't feel he could do therapy today due to having challenges with trach and feeling like he couldn't breathe.  No treatment today.                                                                PATIENT EDUCATION:  Education details: Access Code: [email or web address] Person educated: Patient Education method: Explanation, Demonstration, and Handouts Education comprehension: verbalized understanding and returned demonstration  HOME EXERCISE PROGRAM: Access Code: [email or web address] URL: [email or web address] Date: 06/19/2022 Prepared by: Loistine Simas Beuhring  Exercises - Seated Scapular Retraction  - 5 x daily - 7 x weekly - 1 sets - 10 reps - 5 hold - Supine Scapular Retraction  - 3 x daily - 7 x weekly - 1 sets - 10 reps - 5 hold - Seated Correct Posture  - 1 x daily - 7 x weekly - 3 sets - 10 reps - Seated Cervical Retraction  - 1 x daily - 7 x weekly - 3 sets - 10 reps - Supine Chin Tuck  - 1 x daily - 7 x weekly - 3 sets - 10 reps - Seated Cervical Sidebending AROM  - 3 x daily - 7 x weekly - 1 sets - 10 reps - 5-10 hold - Seated Assisted Cervical Rotation with Towel  - 5 x daily - 7 x weekly - 1 sets - 3 reps - 10 hold  ASSESSMENT:  CLINICAL IMPRESSION: Pt has  clot in the Rt side of his neck, causing face and eye edema.  MD requested no active treatment to the Rt side of his neck for now.  Session focused on elongation and release to Lt side of neck and active head righting to midline.  PT tolerated this treatment well today.  Pt will continue on blood thinners and be followed closely by the MD regarding this issue.  He continues to be very tight along Rt cervical spine.    Pt is motivated to continue with PT and will benefit from continued skilled PT for manual techniques to complement his HEP to improve muscle tightness, ROM and strength to improve QOL.  He stated he feels like he has more  purpose by way of his HEP and even expressed interest in finding a hobby to do with his hands now that he is moving better such as building models or painting/drawing.   OBJECTIVE IMPAIRMENTS: decreased activity tolerance, increased muscle spasms, impaired flexibility, postural dysfunction, and pain.   ACTIVITY LIMITATIONS: sitting, standing, and reach over head  PARTICIPATION LIMITATIONS: meal prep, driving, and community activity  PERSONAL FACTORS: 1-2 comorbidities: cancer on pallative care, chronic postural dysfunction  are also affecting patient's functional outcome.   REHAB POTENTIAL: Good  CLINICAL DECISION MAKING: Stable/uncomplicated  EVALUATION COMPLEXITY: Moderate   GOALS: Goals reviewed with patient? Yes  SHORT TERM GOALS: Target date: 06/27/2022    Be independent in initial  HEP Baseline:  Goal status: met 5/30  2.  Verbalize and demonstrate postural corrections to improve tolerance for neutral posture and reduced neck strain  Baseline:  Goal status: met 5/30   LONG TERM GOALS: Target date: 09/27/22  Be independent in advanced HEP Baseline:  Goal status: ongoing  3.  Improve NDI to < or = to 7/45 Baseline: 11/45 Goal status: 12/45  3.   Perform independent postural correction and hold eyes fixed forward x 3-4 minutes   Baseline: 2 minutes (08/07/22) Goal status:revised    4.   Report > or = to 60-70% reduction in neck pain due to reduced tension and strain in neck Baseline: 35-50%  Goal status:  revised   5. Rest i < or = to 10-15 degrees Rt sidebending x 3 minutes   Baseline: 20 degrees   Goal Status: NEW    PLAN:  PT FREQUENCY: 1x/week  PT DURATION: 6 weeks  PLANNED INTERVENTIONS: Therapeutic exercises, Therapeutic activity, Neuromuscular re-education, Balance training, Gait training, Patient/Family education, Self Care, Joint mobilization, Aquatic Therapy, Dry Needling, Electrical stimulation, Spinal mobilization, Cryotherapy, Moist heat,  Taping, Traction, Manual therapy, and Re-evaluation  PLAN FOR NEXT SESSION:  Follow MD instructions regarding treatment to Rt side of neck  Lorrene Reid, PT 09/11/22 12:29 PM       [facility] [address] [place], [place] [address] Phone # [phone] Fax [phone]

## 2022-09-12 ENCOUNTER — Encounter (HOSPITAL_COMMUNITY): Payer: Self-pay

## 2022-09-12 ENCOUNTER — Observation Stay (HOSPITAL_COMMUNITY)
Admission: AD | Admit: 2022-09-12 | Discharge: 2022-09-13 | Disposition: A | Discharge status: Home / Self Care | Payer: MEDICAID | Source: Ambulatory Visit | Attending: Internal Medicine | Admitting: Internal Medicine

## 2022-09-12 ENCOUNTER — Ambulatory Visit: Payer: MEDICAID | Admitting: Student

## 2022-09-12 ENCOUNTER — Other Ambulatory Visit: Payer: Self-pay

## 2022-09-12 ENCOUNTER — Observation Stay (HOSPITAL_COMMUNITY): Payer: MEDICAID

## 2022-09-12 ENCOUNTER — Encounter: Payer: Self-pay | Admitting: Student

## 2022-09-12 ENCOUNTER — Ambulatory Visit (HOSPITAL_COMMUNITY)
Admission: RE | Admit: 2022-09-12 | Discharge: 2022-09-12 | Disposition: A | Discharge status: Home / Self Care | Payer: MEDICAID | Source: Ambulatory Visit | Attending: Internal Medicine | Admitting: Internal Medicine

## 2022-09-12 VITALS — BP 98/75 | HR 85 | Temp 98.8°F | Wt 102.4 lb

## 2022-09-12 DIAGNOSIS — I4581 Long QT syndrome: Secondary | ICD-10-CM | POA: Insufficient documentation

## 2022-09-12 DIAGNOSIS — I82C11 Acute embolism and thrombosis of right internal jugular vein: Secondary | ICD-10-CM

## 2022-09-12 DIAGNOSIS — Z7901 Long term (current) use of anticoagulants: Secondary | ICD-10-CM | POA: Insufficient documentation

## 2022-09-12 DIAGNOSIS — Z9189 Other specified personal risk factors, not elsewhere classified: Secondary | ICD-10-CM

## 2022-09-12 DIAGNOSIS — Z862 Personal history of diseases of the blood and blood-forming organs and certain disorders involving the immune mechanism: Secondary | ICD-10-CM

## 2022-09-12 DIAGNOSIS — R9431 Abnormal electrocardiogram [ECG] [EKG]: Secondary | ICD-10-CM | POA: Insufficient documentation

## 2022-09-12 DIAGNOSIS — F419 Anxiety disorder, unspecified: Secondary | ICD-10-CM | POA: Diagnosis not present

## 2022-09-12 DIAGNOSIS — Z9889 Other specified postprocedural states: Secondary | ICD-10-CM | POA: Diagnosis not present

## 2022-09-12 DIAGNOSIS — R220 Localized swelling, mass and lump, head: Secondary | ICD-10-CM | POA: Diagnosis present

## 2022-09-12 DIAGNOSIS — D61818 Other pancytopenia: Secondary | ICD-10-CM

## 2022-09-12 DIAGNOSIS — B376 Candidal endocarditis: Secondary | ICD-10-CM

## 2022-09-12 LAB — COMPREHENSIVE METABOLIC PANEL WITH GFR
ALT: 30 U/L (ref 0–44)
AST: 49 U/L — ABNORMAL HIGH (ref 15–41)
Albumin: 3.1 g/dL — ABNORMAL LOW (ref 3.5–5.0)
Alkaline Phosphatase: 56 U/L (ref 38–126)
Anion gap: 14 (ref 5–15)
BUN: <5 mg/dL — ABNORMAL LOW (ref 6–20)
CO2: 25 mmol/L (ref 22–32)
Calcium: 9 mg/dL (ref 8.9–10.3)
Chloride: 97 mmol/L — ABNORMAL LOW (ref 98–111)
Creatinine, Ser: 0.89 mg/dL (ref 0.61–1.24)
GFR, Estimated: >60 mL/min
Glucose, Bld: 66 mg/dL — ABNORMAL LOW (ref 70–99)
Potassium: 3.6 mmol/L (ref 3.5–5.1)
Sodium: 136 mmol/L (ref 135–145)
Total Bilirubin: 0.6 mg/dL (ref 0.3–1.2)
Total Protein: 7.4 g/dL (ref 6.5–8.1)

## 2022-09-12 LAB — CBC
HCT: 37.5 % — ABNORMAL LOW (ref 39.0–52.0)
Hemoglobin: 12 g/dL — ABNORMAL LOW (ref 13.0–17.0)
MCH: 30.2 pg (ref 26.0–34.0)
MCHC: 32 g/dL (ref 30.0–36.0)
MCV: 94.2 fL (ref 80.0–100.0)
Platelets: 207 K/uL (ref 150–400)
RBC: 3.98 MIL/uL — ABNORMAL LOW (ref 4.22–5.81)
RDW: 14.7 % (ref 11.5–15.5)
WBC: 6.8 K/uL (ref 4.0–10.5)
nRBC: 0 % (ref 0.0–0.2)

## 2022-09-12 MED ORDER — ACETAMINOPHEN 650 MG RE SUPP: 650.0000 mg | Freq: Four times a day (QID) | RECTAL | Status: DC | PRN

## 2022-09-12 MED ORDER — SERTRALINE HCL 20 MG/ML PO CONC: 25.0000 mg | Freq: Every day | ORAL | Status: DC

## 2022-09-12 MED ORDER — ACETAMINOPHEN 325 MG PO TABS: 650.0000 mg | ORAL_TABLET | Freq: Four times a day (QID) | ORAL | Status: DC | PRN

## 2022-09-12 MED ORDER — APIXABAN 5 MG PO TABS
5.0000 mg | ORAL_TABLET | Freq: Two times a day (BID) | ORAL | Status: DC
  Administered 2022-09-13 (×2): 5 mg via ORAL
  Filled 2022-09-12 (×2): qty 1

## 2022-09-12 MED ORDER — PREGABALIN 20 MG/ML PO SOLN: 40.0000 mg | Freq: Three times a day (TID) | ORAL | Status: DC

## 2022-09-12 MED ORDER — METHADONE HCL 10 MG PO TABS
120.0000 mg | ORAL_TABLET | Freq: Every day | ORAL | Status: DC
  Administered 2022-09-13: 120 mg via ORAL
  Filled 2022-09-12: qty 12

## 2022-09-12 MED ORDER — FLUCONAZOLE 100 MG PO TABS
400.0000 mg | ORAL_TABLET | Freq: Every day | ORAL | Status: DC
  Administered 2022-09-13: 400 mg via ORAL
  Filled 2022-09-12: qty 4

## 2022-09-12 MED ORDER — IOHEXOL 350 MG/ML SOLN
75.0000 mL | Freq: Once | INTRAVENOUS | Status: AC | PRN
  Administered 2022-09-12: 75 mL via INTRAVENOUS

## 2022-09-12 NOTE — Progress Notes (Signed)
Called patient placement for direct admission.

## 2022-09-12 NOTE — Assessment & Plan Note (Addendum)
During hospitalization, the patient was pancytopenic with elevated transaminases. The pancytopenia could have been due to untreated hepatitis C and malnutrition. Malnutrition is likely the culprit due to lab results of hypoalbuminemia (2.8)and a BMI of 14.69. Malnutrition is also high on the differential due to poor oral intake from poor dentition.  He was evaluated for other causes such as HIV, which he tested negative for. Plan: -Hepatitis C RNA quantity ordered -CBC result today: Platelet count 207, Hbg 12.0, WBC 6.8 -Consider nutrition consult in future/ encourage use of PEG tube

## 2022-09-12 NOTE — Hospital Course (Addendum)
#Jugular vein occlusion, right  #Right facial swelling Patient presented with a right facial and right eyelid swelling. CT on 8/7 showed the new obstruction of the R internal jugular vein secondary to a peripheral calcified and previously biopsied right level 2A lymph node with associated extensive subcutaneous edema throughout the right face involving the right parotid and submandibular glands. Including extensive dental caries. Patient was discharged on 8/9 with Eliquis 10 mg twice daily until 8/14 and then switch to 5 mg BID. Patient reported compliance with Eliquis. Reported increased in swelling after discharge that worsened 2-3 days ago. Concerned about failing outpatient DOAC therapy and clotting of R IJV. IR consulted and recommended CT angio head neck with and without contrast. Patient with one temperature of 100.5 initially when admitted, however now afebrile. Patient denied any sick contacts, no recent symptoms of congestion or cough. Blood cultures so far no growth.   CTA head, neck negative for intracranial abnormality, negative for large vessel occlusion. IR with no recommendations or needs per Dr Corliss Skains.  Patient is evaluated today, facial swelling has improved.  No other concerns at this time.  Patient is advised to continue Eliquis 5 mg twice a day.  Patient is advised to follow-up outpatient at the internal medicine clinic.   Hx of SCC of neck S/p tracheostomy in 05/2021 He was diagnosed in 2022 then underwent chemo and radiation. [name] has been in place since 5/23. Currently in remission. Currently denies any trouble swallowing and trouble breathing.   Anxiety Patient was started with home medication Zoloft 25 mg daily (liquid) and atarax 10 mg at evening. Currently denies any symptoms of anxiety.    History of Candidal endocarditis Patient recently saw Dr. Renold Don, ID, 8/13 to decrease fluconazole high dose 800 mg to 400 mg with repeat TTE scheduled on 09/24/2022  Patient was started  home dose Diflucan 400 mg, liquid form, daily here at the hospital.    Tobacco use disorder Previously smoked 1 pack/day until he was diagnosed with cancer, now only smokes 2-3 cigarettes/day. Nicotine patch.     Chronic pain syndrome Reports taking methadone from the [facility].  His home dose is methadone 120 mg. We will continue it here in patient, in addition to pregabalin solution 40 mg TID. Methadone clinic was called 8/16 to confirm patient dose and prescription.   History of pancytopenia Presented with a hemoglobin of 12.0 from 11.2.  WBC of 6.8 from 3.6. Platelet of 207 from 134, these are compared to CBC upon discharge on 8/9. Currently CBC stable.   Prolonged QTc EKG today shows QTc of 490 compared to previous EKG on 8/7 showed Qtc 499 . Avoid QTc prolonging medications.  -------------------------------------------------------------------------------------------- Mr. Weisel,  He came to the hospital for right face and eye lid swelling. We had imaging of your arteries to your head that did not show large artery blockage.   For your right face swelling, jugular vein occlusion on the right: Please continue to take your blood thinner: -Eliquis (apxiban) 5 mg, take 1 tablet by mouth 2 times daily. -If your swelling of the R face worsens, or you started having blurry vision or pain on the right side of your face, please call [facility] to schedule an appointment.  For your candidal endocarditis Please continue to take your home medication: -Fluconazole(Diflucan) 400 mg daily, that you picked up yesterday from the pharmacy after seeing Dr. Renold Don this week on 8/13.   For your chronic pain Please continue to take your home medication: -  Methadone 120 mg that you receive at the [facility].  -Pregabalin 20 MG/ML Soln, Take 40 mg by mouth 3 (three) times daily   For your anxiety Please continue to take your home medications: -Hydroxyzine Atarax 10 mg / 5 mL, take 5 mL by  mouth at bedtime as needed for anxiety -Sertraline (Zoloft) 20 MG/ML concentrated solution , Mix 1.25 mLs (25 mg total) with 1/2 cup of water and take by mouth daily.   If you have any of these following symptoms, please call us or seek care at an emergency department: -Chest Pain -Difficulty Breathing -Worsening abdominal pain -Syncope (passing out) -Drooping of face -Slurred speech -Sudden weakness in your leg or arm -Fever -Chills -blood in the stool -dark black, sticky stool  I am glad you are feeling better. It was a pleasure taking care for you. I wish a good recovery and good health!   Jeral Pinch, DO, PGY1  ------------------------------------------------------------------------------------------ Disposition  Right face swelling, jugular vein occlusion on the right: Patient was discharged on Eliquis 5 mg BID.  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 Renold Don on 8/13.  Chronic Pain: Patient receives methadone 120 mg from the [facility]. We discharged him with pregabalin 20mg /mL soln TID.  Anxiety: Patient is discharged with hydroxyzine 10mg /mL and Zoloft 20mg /mL daily.

## 2022-09-12 NOTE — H&P (Signed)
Date: 09/12/2022               Patient Name:  David [name] MRN: [id number]  DOB: 11-13-1975 Age / Sex: 47 y.o., male   PCP: Faith Rogue, DO         Medical Service: Internal Medicine Teaching Service         Attending Physician: Dr. Hoy Finlay, Provider, MD      First Contact: Dr. Jeral Pinch, DO Pager [phone]    Second Contact: Dr. Elza Rafter, DO Pager [phone]         After Hours (After 5p/  First Contact Pager: [phone]  weekends / holidays): Second Contact Pager: [phone]   SUBJECTIVE   Chief Complaint: Worsening right sided facial swelling  History of Present Illness: David [name] is a 47 yo male with SCC of the neck s/p tracheostomy placed 05/2021, hx of candida endocarditis, chronic pain syndrome on methadone, who presented to the [facility] with R sided facial swelling.  Discharged from hospital on 8/9 for R internal jugular occlusion and placed on Eliquis. His face was not swollen like this at discharge, but he does have some swelling at baseline from radiation. The last few days, he has noticed an increase in swelling - spread from cheek to R eye. He said the swelling has gotten worse over the last couple of days. Patient denies any pain or tenderness, numbness or tingling on the R side of the face. No drainage. No tongue swelling, no neck swelling, no new dysphagia (has this chronically), no SOB. Denies lightheadedness, weakness, headaches, or visiual changes. Patient reports compliance on his medications that he was discharged with. Patient reports taking his Eliquis as prescribed. Otherwise. patient denies any sick contacts. No cough. No chills, chest pain, abd pain, n/v/d. Last BM yesterday. No urinary sxs. New fever now.   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, Right Protein caloric malnutrition History of candidal endocarditis Anxiety  Past Surgical History:  Procedure Laterality Date   chest tubes Left    IR GASTROSTOMY TUBE MOD SED  06/18/2021   LAPAROSCOPIC INSERTION GASTROSTOMY TUBE N/A 06/20/2021   Procedure: LAPAROSCOPIC INSERTION GASTROSTOMY TUBE;  Surgeon: Berna Bue, MD;  Location: [facility] OR;  Service: General;  Laterality: N/A;  DOW PATIENT   TRACHEOSTOMY TUBE PLACEMENT N/A 06/09/2021   Procedure: AWAKE TRACHEOSTOMY;  Surgeon: Christia Reading, MD;  Location: [facility] OR;  Service: ENT;  Laterality: N/A;    Social:  Lives alone in [place]  Support: Mother  Level of Function: Independent of ADLs No alcohol use Smokes 2-3 cigarettes/day, down from 1 pack/day before cancer diagnosis No illicit substances   PCP: Dr. Hessie Diener at [facility]   Family History:  No family history   Allergies: Allergies as of 09/12/2022   (No Known Allergies)    Review of Systems: A complete ROS was negative except as per HPI.   OBJECTIVE:   Physical Exam: Blood pressure (!) 85/61, pulse 90, temperature (!) 100.5 F (38.1 C), temperature source Oral, resp. rate 17, SpO2 99%.   Constitutional: cachetic male. Laying comfortably, in no acute distress HENT: significant edema and erythema of R cheek and R periorbital region, warm to touch, non tender. +  dental carries.  Eyes: conjunctiva non-erythematous Neck: trach in place, no drainage noted.   Cardiovascular: regular rate and rhythm, no m/r/g Pulmonary/Chest: normal work of breathing on room air, lungs clear to auscultation bilaterally Abdominal: soft, non-tender, non-distended, Bowel sound present MSK: normal bulk and tone Neurological: alert & oriented x 3, normal gait, no focal deficit Skin: warm and dry Psych: normal mood and affect   Labs: CBC    Component Value Date/Time   WBC 6.8 09/12/2022  1236   RBC 3.98 (L) 09/12/2022 1236   HGB 12.0 (L) 09/12/2022 1236   HGB 14.0 03/30/2021 0856   HCT 37.5 (L) 09/12/2022 1236   PLT 207 09/12/2022 1236   PLT 308 03/30/2021 0856   MCV 94.2 09/12/2022 1236   MCV 100.9 (A) 07/27/2011 1302   MCH 30.2 09/12/2022 1236   MCHC 32.0 09/12/2022 1236   RDW 14.7 09/12/2022 1236   LYMPHSABS 0.5 (L) 09/04/2022 1204   MONOABS 0.6 09/04/2022 1204   EOSABS 0.1 09/04/2022 1204   BASOSABS 0.0 09/04/2022 1204     CMP     Component Value Date/Time   NA 136 09/12/2022 1236   K 3.6 09/12/2022 1236   CL 97 (L) 09/12/2022 1236   CO2 25 09/12/2022 1236   GLUCOSE 66 (L) 09/12/2022 1236   BUN <5 (L) 09/12/2022 1236   CREATININE 0.89 09/12/2022 1236   CREATININE 0.71 02/06/2022 1453   CALCIUM 9.0 09/12/2022 1236   PROT 7.4 09/12/2022 1236   ALBUMIN 3.1 (L) 09/12/2022 1236   AST 49 (H) 09/12/2022 1236   AST 88 (H) 03/30/2021 0856   ALT 30 09/12/2022 1236   ALT 39 03/30/2021 0856   ALKPHOS 56 09/12/2022 1236   BILITOT 0.6 09/12/2022 1236   BILITOT 0.6 03/30/2021 0856   GFRNONAA >60 09/12/2022 1236   GFRNONAA >60 05/15/2021 1339   GFRAA  01/25/2008 2040    >60        The eGFR has been calculated using the MDRD equation. This calculation has not been validated in all clinical    Imaging: CT angio head neck w wo contrast pending  EKG: personally reviewed my interpretation is normal sinus rhythm, normal PR interval, normal QRS interval, prolonged Qtc 490, noted in the previous EKG as well.    ASSESSMENT & PLAN:   Assessment & Plan by Problem: Active Problems:   * No active hospital problems. *   David [name] is a 47 y.o. person living with a history of SCC of the neck s/p tracheostomy placed 05/2021, hx of candida endocarditis, chronic pain syndrome on methadone, presented to the [facility] with R sided facial swelling, admitted for further workup.    #Jugular vein occlusion, right  #Right facial swelling CT on 8/7 showed the new  obstruction of the R internal jugular vein secondary to a peripheral calcified and previously biopsied right level 2A lymph node with associated extensive subcutaneous edema throughout the right face involving the right parotid and submandibular glands. Including extensive dental caries. Patient was discharged on 8/9 with Eliquis 10 mg twice daily until 8/14 and then switch to 5 mg BID. Patient reported compliance with Eliquis. Reported increased in swelling after discharge that worsened 2-3 days ago. Concerned about failing outpatient DOAC therapy and clotting of R IJV. IR consulted and recommended CT angio head neck with and without contrast. Patient with one temperature of 100.5 initially when admitted, however now afebrile. Concerned about likely bacteremia, though patient denies any sick contacts, no recent symptoms of  congestion or cough.  - Continue Eliquis 5 mg BID, IR currently not planning any procedures at this time.  - Follow up on imaging  - Follow up on blood cultures - Monitor for any signs of fevers   Hx of SCC of neck S/p tracheostomy in 05/2021 He was diagnosed in 2022 then underwent chemo and radiation. [name] has been in place since 5/23. Currently in remission. Currently denies any trouble swallowing and trouble breathing.  Anxiety We have restarted home medication Zolofot 25 mg daily (liquid). Currently denies any symptoms of anxiety. He does have hydroxyzine as a home medication but is currently not taking it.   History of Candidal endocarditis Patient recently saw Dr. Renold Don, ID, 8/13 to decrease fluconazole high dose 800 mg to 400 mg with repeat TTE scheduled on 09/24/2022  - We have started home dose Diflucan 400 mg, liquid form, daily  Tobacco use disorder Previously smoked 1 pack/day until he was diagnosed with cancer, now only smokes 2-3 cigarettes/day. - Nicotine patch   Chronic pain syndrome Reports taking methadone from the [facility].  His home dose is  methadone 120 mg. We will continue it here in patient, in addition to pregabalin solution 40 mg TID. Will call Methadone clinic at [facility] to confirm tomorrow.   History of pancytopenia Presented with a hemoglobin of 12.0 from 11.2.  WBC of 6.8 from 3.6. Platelet of 207 from 134, these are compared to CBC upon discharge on 8/9. Currently CBC stable. Will continue to monitor.   Prolonged QTc EKG today shows QTc of 490 compared to previous EKG on 8/7 showed Qtc 499 . Avoid QTc prolonging medications.  Diet: Normal VTE: Eliquis  IVF: Note Code: Full  Prior to Admission Living Arrangement: Home, living self Anticipated Discharge Location: Home Barriers to Discharge: Medical management   Dispo: Admit patient to Observation with expected length of stay less than 2 midnights.  Signed: Jeral Pinch, DO Internal Medicine Resident PGY-1  09/12/2022, 2:43 PM

## 2022-09-12 NOTE — Assessment & Plan Note (Signed)
Patient has a history of Candidal endocarditis currently treated with fluconazole 800 mg daily managed by infectious disease. Plan -Continue fluconazole 800 mg -Continue follow-up with infectious disease -Patient has echocardiogram scheduled for 8/27, will follow-up on results at next appointment

## 2022-09-12 NOTE — Assessment & Plan Note (Addendum)
Patient has a history of anxiety and was started on sertraline 25mg  daily.  While he was in the hospital he was also started on hydroxyzine 10 mg as needed.  The patient stated that medications are helping with anxiety.  Will need to order an EKG due to QT prolonging medications such as sertraline added. Plan -Continue sertraline 25 mg -Continue hydroxyzine 10 mg as needed -EKG ordered: QTc is 490, was previously 499.

## 2022-09-12 NOTE — Plan of Care (Signed)

## 2022-09-12 NOTE — Progress Notes (Signed)
Notified attending team about patients current vitals signs

## 2022-09-12 NOTE — Consult Note (Signed)
Chief Complaint: Right internal jugular thrombus  Referring Provider(s): Nischal Heide Spark  Supervising Physician: Julieanne Cotton  Patient Status: [facility] - In-pt  History of Present Illness: David [name] is a 47 y.o. male with stage IV squamous cell carcinoma, right supraglottic mass, adenopathy, s/p initial concurrent chemoradiation, history of drug use currently followed by methadone clinic, homelessness, and malnutrition.   He was recently hospitalized for several months at [facility] where he received treatment for fungal endocarditis and required emergent tracheostomy and PEG tube.  He was found to have right internal jugular thrombus on CT scan done 09/04/22.  He was on Eliquis. His facial swelling seems to be getting worse.  He denies any pain in his face or neck.  We are asked to evaluate for any intervention.  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)    Pneumococcal bacteremia 06/09/2021   Pneumothorax    Left lung spontaneous pneumothorax at age 86 yr     Past Surgical History:  Procedure Laterality Date   chest tubes Left    IR GASTROSTOMY TUBE MOD SED  06/18/2021   LAPAROSCOPIC INSERTION GASTROSTOMY TUBE N/A 06/20/2021   Procedure: LAPAROSCOPIC INSERTION GASTROSTOMY TUBE;  Surgeon: Berna Bue, MD;  Location: [facility] OR;  Service: General;  Laterality: N/A;  DOW PATIENT   TRACHEOSTOMY TUBE PLACEMENT N/A 06/09/2021   Procedure: AWAKE TRACHEOSTOMY;  Surgeon: Christia Reading, MD;  Location: [facility] OR;  Service: ENT;  Laterality: N/A;    Allergies: Patient has no known  allergies.  Medications: Prior to Admission medications   Medication Sig Start Date End Date Taking? Authorizing Provider  apixaban (ELIQUIS) 5 MG TABS tablet Take 2 tablets (10mg ) two times daily until 08/14. On 08/15, start taking  1 tablet (5mg ) two times daily. You will be on this medication long term. 09/06/22   Masters, Katie, DO  fluconazole (DIFLUCAN) 200 MG tablet Take 4 tablets (800 mg total) by mouth daily. 09/06/22   Masters, Florentina Addison, DO  hydrOXYzine (ATARAX) 10 MG/5ML syrup Take 5 mLs (10 mg total) by mouth at bedtime as needed for anxiety. 09/06/22   Masters, Katie, DO  methadone (DOLOPHINE) 10 MG/ML solution Take 6 mLs (60 mg total) by mouth daily. Patient taking differently: Take 120 mg by mouth daily. 01/08/21   Pickenpack-Cousar, Arty Baumgartner, NP  Pregabalin 20 MG/ML SOLN Take 40 mg by mouth 3 (three) times daily. 09/11/22   Pickenpack-Cousar, Arty Baumgartner, NP  sertraline (ZOLOFT) 20 MG/ML concentrated solution Mix 1.25 mLs (25 mg total)  with 1/2 cup of water and take by mouth daily. 09/06/22   Masters, Katie, DO     No family history on file.  Social History   Socioeconomic History   Marital status: Divorced    Spouse name: Not on file   Number of children: Not on file   Years of education: Not on file   Highest education level: Not on file  Occupational History   Not on file  Tobacco Use   Smoking status: Every Day    Current packs/day: 3.00    Types: Cigarettes   Smokeless tobacco: Never   Tobacco comments:  10 cigs per day  Vaping Use   Vaping status: Never Used  Substance and Sexual Activity   Alcohol use: Not Currently    Alcohol/week: 12.0 standard drinks of alcohol    Types: 12 Cans of beer per week   Drug use: Not Currently    Types: IV    Comment: 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)   Sexual activity: Not on file  Other Topics Concern   Not on file  Social History Narrative   Not on file   Social Determinants of Health   Financial Resource  Strain: Low Risk  (03/19/2022)   Overall Financial Resource Strain (CARDIA)    Difficulty of Paying Living Expenses: Not hard at all  Food Insecurity: Patient Declined (09/12/2022)   Hunger Vital Sign    Worried About Running Out of Food in the Last Year: Patient declined    Ran Out of Food in the Last Year: Patient declined  Transportation Needs: Patient Declined (09/12/2022)   PRAPARE - Administrator, Civil Service (Medical): Patient declined    Lack of Transportation (Non-Medical): Patient declined  Physical Activity: Insufficiently Active (03/19/2022)   Exercise Vital Sign    Days of Exercise per Week: 3 days    Minutes of Exercise per Session: 20 min  Stress: No Stress Concern Present (03/19/2022)   [employer] of Occupational Health - Occupational Stress Questionnaire    Feeling of Stress : Only a little  Social Connections: Socially Isolated (03/19/2022)   Social Connection and Isolation Panel [NHANES]    Frequency of Communication with Friends and Family: More than three times a week    Frequency of Social Gatherings with Friends and Family: Three times a week    Attends Religious Services: Never    Active Member of Clubs or Organizations: No    Attends Banker Meetings: Never    Marital Status: Divorced    Review of Systems: A 12 point ROS discussed and pertinent positives are indicated in the HPI above.  All other systems are negative.    Vital Signs: BP 94/70 (BP Location: Left Arm)   Pulse 80   Temp 98.1 F (36.7 C) (Oral)   Resp 17   SpO2 99%   Advance Care Plan: The advanced care place/surrogate decision maker was discussed at the time of visit and the patient did not wish to discuss or was not able to name a surrogate decision maker or provide an advance care plan.  Physical Exam Constitutional:      Appearance: Normal appearance.  HENT:     Head: Normocephalic and atraumatic.  Eyes:     Extraocular Movements: Extraocular  movements intact.     Comments: Right eyelid very edematous. Patient is able to open his eye and can see.  Neck:     Comments: Tracheostomy/trach collar in place Cardiovascular:     Rate and Rhythm: Normal rate and regular rhythm.  Pulmonary:     Effort: Pulmonary effort is normal. No respiratory distress.  Skin:    General: Skin is warm and dry.  Neurological:     General: No focal deficit present.     Mental Status: He is alert and oriented to person, place, and time.  Psychiatric:        Mood and Affect: Mood normal.        Behavior: Behavior normal.        Thought Content: Thought content normal.  Judgment: Judgment normal.     Imaging: CT Maxillofacial W Contrast  Addendum Date: 09/04/2022   ADDENDUM REPORT: 09/04/2022 15:03 ADDENDUM: CORRECTION: Occlusion of the RIGHT internal jugular vein, not the left. Electronically Signed   By: Orvan Falconer M.D.   On: 09/04/2022 15:03   Result Date: 09/04/2022 CLINICAL DATA:  Right-sided facial swelling. Right upper eyelid swelling. EXAM: CT MAXILLOFACIAL WITH CONTRAST TECHNIQUE: Multidetector CT imaging of the maxillofacial structures was performed with intravenous contrast. Multiplanar CT image reconstructions were also generated. RADIATION DOSE REDUCTION: This exam was performed according to the departmental dose-optimization program which includes automated exposure control, adjustment of the mA and/or kV according to patient size and/or use of iterative reconstruction technique. CONTRAST:  75mL OMNIPAQUE IOHEXOL 300 MG/ML  SOLN COMPARISON:  Neck CT 08/11/2021. Ultrasound-guided cervical lymph node biopsy 09/14/2021. FINDINGS: Osseous: No fracture or mandibular dislocation. Extensive dental caries. Orbits: Negative. No traumatic or inflammatory finding. Sinuses: Mild mucosal disease of the right maxillary sinus. Soft tissues: The previously biopsied, peripherally calcified right level 2A lymph node is unchanged in size, measuring up to  31 x 30 mm (sagittal image 25 series 7). 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 Limited intracranial: Unremarkable. IMPRESSION: 1. 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 2. Extensive dental caries. Electronically Signed: By: Orvan Falconer M.D. On: 09/04/2022 14:50    Labs:  CBC: Recent Labs    09/04/22 2314 09/05/22 0612 09/06/22 0143 09/12/22 1236  WBC 3.1* 3.1* 3.6* 6.8  HGB 11.3* 12.5* 11.2* 12.0*  HCT 34.4* 38.1* 34.7* 37.5*  PLT 112* 85* 134* 207    COAGS: Recent Labs    09/14/21 1142 09/04/22 1514  INR 1.1 1.1    BMP: Recent Labs    02/06/22 1453 09/04/22 1204 09/05/22 0612 09/12/22 1236  NA 139 135 135 136  K 4.4 4.0 3.9 3.6  CL 103 102 99 97*  CO2 24 26 29 25   GLUCOSE 111* 125* 78 66*  BUN 17 11 6  <5*  CALCIUM 9.1 8.5* 8.7* 9.0  CREATININE 0.71 0.82 1.02 0.89  GFRNONAA  --  >60 >60 >60    LIVER FUNCTION TESTS: Recent Labs    11/07/21 1559 02/06/22 1453 09/05/22 0612 09/12/22 1236  BILITOT 0.2 0.3 0.5 0.6  AST 29 51* 47* 49*  ALT 18 51* 34 30  ALKPHOS  --   --  57 56  PROT 7.4 7.7 6.3* 7.4  ALBUMIN  --   --  2.8* 3.1*    TUMOR MARKERS: No results for input(s): "AFPTM", "CEA", "CA199", "CHROMGRNA" in the last 8760 hours.  Assessment and Plan:  Right internal jugular thrombus - on Eliquis.  Patient seen and examined by Dr. Corliss Skains.  Recommend CTA of the head/neck for further evaluation of vasculature.   Thank you for allowing our service to participate in David [name] 's care.  Electronically Signed: Gwynneth Macleod, PA-C    09/12/2022, 4:08 PM      I spent a total of 20 Minutes  in face to face in clinical consultation, greater than 50% of which was counseling/coordinating care for eval right internal jugular thrombus.

## 2022-09-12 NOTE — Progress Notes (Signed)
Established Patient Office Visit  Subjective   Patient ID: David [name], male    DOB: Jun 13, 1975  Age: 47 y.o. MRN: [id number]  Chief Complaint  Patient presents with   Hospitalization Follow-up    David [name] is a 47 year old male with a past medical history outlined below who is here for a hospitalization follow-up for right internal jugular vein occlusion. He was discharged home on Eliquis. His hospital course was complicated by pancytopenia. Today, the patient presented with increasing right facial swelling.  He reported that he is taking his Eliquis properly and is compliant with Eliquis.  Denied right facial pain.   Of note, the patient declined referral for trach clinic.  He reported having supplies at the hospital and stated that he does not need to go because he has supplies. Please see problem based assessment and plan for additional details.          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)    Pneumococcal bacteremia 06/09/2021   Pneumothorax    Left lung spontaneous pneumothorax at age 44 yr       Review of Systems  Respiratory:  Negative for shortness of breath.   Cardiovascular:  Negative for chest pain.      Objective:     BP 98/75 (BP Location: Left Arm, Patient Position: Sitting, Cuff Size: Small) Comment: (Map 81)  Pulse 85   Temp 98.8 F (37.1 C) (Oral)   Wt 102 lb 6.4 oz (46.4 kg)   SpO2 99%   BMI 14.69 kg/m  BP Readings from Last 3 Encounters:  09/12/22 94/70  09/12/22 98/75  09/11/22 90/66      Physical Exam Constitutional:      General: He is not in acute  distress.    Appearance: He is ill-appearing. He is not toxic-appearing.  HENT:     Head: Right periorbital erythema present.     Comments: Significant right facial swelling present.  No tenderness on exam.  Erythematous, increased warmth on the right face. Cardiovascular:     Rate and Rhythm: Normal rate and regular rhythm.  Pulmonary:     Effort: Pulmonary effort is normal.     Breath sounds: Normal breath sounds.  Skin:    General: Skin is warm and dry.  Neurological:     Mental Status: He is alert and oriented to person, place, and time.  Psychiatric:        Behavior: Behavior is cooperative.      Results for orders placed or performed in visit on 09/12/22  CBC no Diff  Result Value Ref Range   WBC 6.8 4.0 - 10.5 K/uL   RBC 3.98 (L) 4.22 - 5.81 MIL/uL   Hemoglobin 12.0 (L) 13.0 - 17.0 g/dL   HCT 40.9 (L) 81.1 - 91.4 %   MCV 94.2 80.0 - 100.0 fL   MCH 30.2 26.0 - 34.0 pg   MCHC 32.0 30.0 - 36.0 g/dL   RDW 78.2 95.6 - 21.3 %   Platelets 207 150 - 400 K/uL   nRBC 0.0 0.0 - 0.2 %  CMP w Anion Gap (STAT/Sunquest-performed on-site)  Result  Value Ref Range   Sodium 136 135 - 145 mmol/L   Potassium 3.6 3.5 - 5.1 mmol/L   Chloride 97 (L) 98 - 111 mmol/L   CO2 25 22 - 32 mmol/L   Glucose, Bld 66 (L) 70 - 99 mg/dL   BUN <5 (L) 6 - 20 mg/dL   Creatinine, Ser 1.61 0.61 - 1.24 mg/dL   Calcium 9.0 8.9 - 09.6 mg/dL   Total Protein 7.4 6.5 - 8.1 g/dL   Albumin 3.1 (L) 3.5 - 5.0 g/dL   AST 49 (H) 15 - 41 U/L   ALT 30 0 - 44 U/L   Alkaline Phosphatase 56 38 - 126 U/L   Total Bilirubin 0.6 0.3 - 1.2 mg/dL   GFR, Estimated >04 >54 mL/min   Anion gap 14 5 - 15    Last CBC Lab Results  Component Value Date   WBC 6.8 09/12/2022   HGB 12.0 (L) 09/12/2022   HCT 37.5 (L) 09/12/2022   MCV 94.2 09/12/2022   MCH 30.2 09/12/2022   RDW 14.7 09/12/2022   PLT 207 09/12/2022   Last metabolic panel Lab Results  Component Value Date   GLUCOSE 66 (L) 09/12/2022   NA 136 09/12/2022    K 3.6 09/12/2022   CL 97 (L) 09/12/2022   CO2 25 09/12/2022   BUN <5 (L) 09/12/2022   CREATININE 0.89 09/12/2022   GFRNONAA >60 09/12/2022   CALCIUM 9.0 09/12/2022   PHOS 4.6 09/05/2022   PROT 7.4 09/12/2022   ALBUMIN 3.1 (L) 09/12/2022   BILITOT 0.6 09/12/2022   ALKPHOS 56 09/12/2022   AST 49 (H) 09/12/2022   ALT 30 09/12/2022   ANIONGAP 14 09/12/2022   Last lipids Lab Results  Component Value Date   CHOL 98 04/01/2021   HDL 35 (L) 04/01/2021   LDLCALC 55 04/01/2021   TRIG 40 04/01/2021   CHOLHDL 2.8 04/01/2021   Last hemoglobin A1c No results found for: "HGBA1C"    The ASCVD Risk score (Arnett DK, et al., 2019) failed to calculate for the following reasons:   The patient has a prior MI or stroke diagnosis    Assessment & Plan:   Problem List Items Addressed This Visit       Cardiovascular and Mediastinum   Candidal endocarditis    Patient has a history of Candidal endocarditis currently treated with fluconazole 800 mg daily managed by infectious disease. Plan -Continue fluconazole 800 mg -Continue follow-up with infectious disease -Patient has echocardiogram scheduled for 8/27, will follow-up on results at next appointment      Jugular vein occlusion, right [facility])    Patient was discharged from the hospital for a right internal jugular vein occlusion on 8/9.  He was discharged home on Eliquis 10 mg twice daily and recently transitioned to 5 mg twice daily yesterday.  Today, patient and mother noticed increased swelling of the right face and right eye.  Patient denied right facial pain.  Physical exam was significant for right facial edema with increased periorbital edema, erythema, warmth to the right face.  Patient showed a picture of his face on the day he was admitted, the swelling is significantly worse today than it was in the picture.  Due to increase in symptoms while on Eliquis, Dr. Criselda Peaches spoke with an interventional radiologist for possible treatments.   There are some concerns that the emboli could be septic due to candidal endocarditis.  The interventional radiologist recommended direct admit to the hospital for an official consultation.  The patient is hemodynamically stable, bed control was called and patient was notified that he will be admitted to the hospital today for further treatment of the right internal jugular vein occlusion.  On call team notified of admission. PLAN: -Patient is hemodynamically stable, he does not require emergent admission to the emergency department -Patient and mother were updated on the plan for a direct admit -Per bed control, a hospital bed will be available around 4:00 today -Patient and mother's phone number both updated in the chart -CBC ordered         Hematopoietic and Hemostatic   History of pancytopenia    During hospitalization, the patient was pancytopenic with elevated transaminases. The pancytopenia could have been due to untreated hepatitis C and malnutrition. Malnutrition is likely the culprit due to lab results of hypoalbuminemia (2.8)and a BMI of 14.69. Malnutrition is also high on the differential due to poor oral intake from poor dentition.  He was evaluated for other causes such as HIV, which he tested negative for. Plan: -Hepatitis C RNA quantity ordered -CBC result today: Platelet count 207, Hbg 12.0, WBC 6.8 -Consider nutrition consult in future/ encourage use of PEG tube          Other   Anxiety    Patient has a history of anxiety and was started on sertraline 25mg  daily.  While he was in the hospital he was also started on hydroxyzine 10 mg as needed.  The patient stated that medications are helping with anxiety.  Will need to order an EKG due to QT prolonging medications such as sertraline added. Plan -Continue sertraline 25 mg -Continue hydroxyzine 10 mg as needed -EKG ordered: QTc is 490, was previously 499.      Other Visit Diagnoses     At risk for prolonged QT  interval syndrome    -  Primary   Relevant Orders   EKG 12-Lead (Completed)   Pancytopenia (HCC)       Relevant Orders   CBC no Diff (Completed)   HCV RNA quant   CMP w Anion Gap (STAT/Sunquest-performed on-site) (Completed)        Return For HFU.    Faith Rogue, DO

## 2022-09-12 NOTE — Assessment & Plan Note (Addendum)
Patient was discharged from the hospital for a right internal jugular vein occlusion on 8/9.  He was discharged home on Eliquis 10 mg twice daily and recently transitioned to 5 mg twice daily yesterday.  Today, patient and mother noticed increased swelling of the right face and right eye.  Patient denied right facial pain.  Physical exam was significant for right facial edema with increased periorbital edema, erythema, warmth to the right face.  Patient showed a picture of his face on the day he was admitted, the swelling is significantly worse today than it was in the picture.  Due to increase in symptoms while on Eliquis, Dr. Criselda Peaches spoke with an interventional radiologist for possible treatments.  There are some concerns that the emboli could be septic due to candidal endocarditis.  The interventional radiologist recommended direct admit to the hospital for an official consultation.  The patient is hemodynamically stable, bed control was called and patient was notified that he will be admitted to the hospital today for further treatment of the right internal jugular vein occlusion.  On call team notified of admission. PLAN: -Patient is hemodynamically stable, he does not require emergent admission to the emergency department -Patient and mother were updated on the plan for a direct admit -Per bed control, a hospital bed will be available around 4:00 today -Patient and mother's phone number both updated in the chart -CBC ordered

## 2022-09-13 ENCOUNTER — Other Ambulatory Visit: Payer: Self-pay | Admitting: Student

## 2022-09-13 DIAGNOSIS — I82C11 Acute embolism and thrombosis of right internal jugular vein: Secondary | ICD-10-CM | POA: Diagnosis not present

## 2022-09-13 LAB — HCV RNA QUANT
HCV Quantitative Log: 6.167 {Log_IU}/mL (ref >1.70)
HCV Quantitative: 1470000 [IU]/mL (ref >50)

## 2022-09-13 MED ORDER — SERTRALINE HCL 50 MG PO TABS: 25.0000 mg | ORAL_TABLET | Freq: Every day | ORAL | Status: DC

## 2022-09-13 MED ORDER — SERTRALINE HCL 50 MG PO TABS
25.0000 mg | ORAL_TABLET | Freq: Every day | ORAL | Status: DC
  Administered 2022-09-13: 25 mg via ORAL
  Filled 2022-09-13: qty 1

## 2022-09-13 MED ORDER — HYDROXYZINE HCL 10 MG PO TABS
10.0000 mg | ORAL_TABLET | Freq: Every evening | ORAL | Status: DC
  Administered 2022-09-13: 10 mg via ORAL
  Filled 2022-09-13: qty 1

## 2022-09-13 MED ORDER — PREGABALIN 50 MG PO CAPS
50.0000 mg | ORAL_CAPSULE | Freq: Three times a day (TID) | ORAL | Status: DC
  Administered 2022-09-13: 50 mg via ORAL
  Filled 2022-09-13: qty 1

## 2022-09-13 MED ORDER — APIXABAN 5 MG PO TABS: 5.0000 mg | ORAL_TABLET | Freq: Two times a day (BID) | ORAL | 0 refills | Status: DC

## 2022-09-13 MED ORDER — FLUCONAZOLE 200 MG PO TABS: 400.0000 mg | ORAL_TABLET | Freq: Every day | ORAL | Status: AC

## 2022-09-13 NOTE — Progress Notes (Addendum)
   09/13/22 0945  TOC Brief Assessment  Insurance and Status Reviewed  Patient has primary care physician Yes  Home environment has been reviewed yes  Prior level of function: independent  Prior/Current Home Services Current home services  Social Determinants of Health Reivew SDOH reviewed no interventions necessary  Readmission risk has been reviewed Yes  Transition of care needs no transition of care needs at this time     Patient from home , has [facility] for outpatient palliative services.  [facility] aware patient is at hospital  OP PT at [facility] following for discharge needs

## 2022-09-13 NOTE — Progress Notes (Signed)
 [facility] Liaison Note  This is a current outpatient based palliative care patient with [facility].  [facility] hospital liaison will continue to follow for discharge disposition.   Please call for any outpatient based outpatient palliative care related questions or concerns.   Thank you.   Dionicio Stall, [facility] Liaison [phone]

## 2022-09-13 NOTE — Progress Notes (Signed)
Patient was notified of EKG during visit.  QTc is stable at 490.

## 2022-09-13 NOTE — Plan of Care (Signed)

## 2022-09-13 NOTE — Discharge Summary (Addendum)
Name: David [name] MRN: [id number] DOB: [birth year] 47 y.o. PCP: Faith Rogue, DO  Date of Admission: 09/12/2022  2:18 PM Date of Discharge:  09/13/2022 Attending Physician: Dr. 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 What changed:  how much to take how to take this when to take this additional instructions   fluconazole 200 MG tablet Commonly known as: DIFLUCAN Take 2 tablets (400 mg total) by mouth daily. Start taking on: September 14, 2022 What changed:  how much to take Another medication with the same name was removed. Continue taking this medication, and follow the directions you see here.   hydrOXYzine 10 MG/5ML syrup Commonly known as: ATARAX Take 5 mLs (10 mg total) by mouth at bedtime as needed for anxiety.   methadone 10 MG/ML solution Commonly known as: DOLOPHINE Take 6 mLs (60 mg total) by mouth daily. What changed: how much to take   Pregabalin 20 MG/ML Soln Take 40 mg by mouth 3 (three) times daily.   sertraline 20 MG/ML concentrated solution Commonly known as: ZOLOFT Mix 1.25 mLs (25 mg total)  with 1/2 cup of water and take by mouth daily.        DISPOSITION AND FOLLOW-UP:  David [name] was discharged from [facility] in Stable condition. At the hospital follow up visit please address:  Right face swelling, jugular vein occlusion on the right: Not a good candidate for thrombectomy with prior radiation due to friable tissue. IR with no recommendations. CTA head negative for LVO. Patient was discharged on Eliquis 5 mg BID.  Please assess patient's swelling whether it has improved or not.  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 Renold Don on 8/13. He has another appointment scheduled in 11/04/2022. Chronic Pain: Patient receives methadone 120 mg from the [facility]. We discharged him with pregabalin 20mg /mL soln TID.  Anxiety: Patient is discharged with hydroxyzine 10mg /mL and Zoloft 20mg /mL daily. Please get an EKG for QTC prolongation.   Follow-up Recommendations: Consults: None  Labs: Basic Metabolic Profile and CBC Studies: None  Medications: Eliquis 5 mg twice daily, hydroxyzine 10 mg/5 ml daily, Zoloft 20 mg/ml daily, methadone 10 mg/mL, pregabalin 20 mg/ml daily.   Follow-up Appointments:  Follow-up Information     Faith Rogue, DO Follow up.   Specialty: Internal Medicine Why: I emailed [name] to schedule an appointment. Once it is available, We will notify you via call. Contact information: [address] [place] [address] [phone]                Cardiology: 09/24/2022 at 11:15 AM Infectious disease: 11/04/2022 at 11 AM with Dr. Renold Don Oncology: 11/26/2022 at 3:45 PM with Dr. Al Pimple   HOSPITAL COURSE:  #Jugular vein occlusion, right  #Right facial swelling Patient presented with a right facial and right eyelid swelling. CT on 8/7 showed the new obstruction of the R internal jugular vein secondary to a peripheral calcified and previously biopsied right level 2A lymph node with  associated extensive subcutaneous edema throughout the right face involving the right parotid and submandibular glands. Including extensive dental caries. Patient was discharged on 8/9 with Eliquis 10 mg twice daily until 8/14 and then switch to 5 mg BID. Patient reported compliance with Eliquis. Reported increased in swelling after discharge that worsened 2-3 days ago. Concerned about failing outpatient DOAC therapy  and clotting of R IJV. IR consulted and recommended CT angio head neck with and without contrast. Patient with one temperature of 100.5 initially when admitted, however now afebrile.  No leukocytosis noted.  No recurrent fevers.  Patient denied any sick contacts, no recent symptoms of congestion or cough. Blood cultures so far no growth.   CTA head, neck negative for intracranial abnormality, negative for large vessel occlusion. IR with no recommendations or needs per Dr Corliss Skains.  Patient is evaluated today, facial swelling has improved.  No other concerns at this time.  Patient is advised to continue Eliquis 5 mg twice a day.  Patient is advised to follow-up outpatient at the internal medicine clinic.   Hx of SCC of neck S/p tracheostomy in 05/2021 He was diagnosed in 2022 then underwent chemo and radiation. [name] has been in place since 5/23. Currently in remission. Currently denies any trouble swallowing and trouble breathing.   Anxiety Patient was started with home medication Zoloft 25 mg daily (liquid) and atarax 10 mg at evening. Currently denies any symptoms of anxiety.    History of Candidal endocarditis Patient recently saw Dr. Renold Don, ID, 8/13 to decrease fluconazole high dose 800 mg to 400 mg with repeat TTE scheduled on 09/24/2022  Patient was started home dose Diflucan 400 mg, liquid form, daily here at the hospital.    Tobacco use disorder Previously smoked 1 pack/day until he was diagnosed with cancer, now only smokes 2-3 cigarettes/day. Nicotine patch.     Chronic pain syndrome Reports taking methadone from the [facility].  His home dose is methadone 120 mg. We will continue it here in patient, in addition to pregabalin solution 40 mg TID. Methadone clinic was called 8/16 to confirm patient dose and prescription.   History of pancytopenia Presented with a hemoglobin of 12.0 from 11.2.  WBC of 6.8 from 3.6. Platelet of 207 from 134, these are compared to CBC upon  discharge on 8/9. Currently CBC stable.   Prolonged QTc EKG today shows QTc of 490 compared to previous EKG on 8/7 showed Qtc 499 . Avoid QTc prolonging medications.  DISCHARGE INSTRUCTIONS:   Discharge Instructions     Diet - low sodium heart healthy   Complete by: As directed    Discharge instructions   Complete by: As directed    David [name],  He came to the hospital for right face and eye lid swelling.   For your right face swelling, jugular vein occlusion on the right: Please continue to take your blood thinner: -Eliquis (apxiban) 5 mg, take 1 tablet by mouth 2 times daily. -If your swelling of the R face worsens, or you started having blurry vision or pain on the right side of your face, please call [facility] to schedule an appointment.  For your candidal endocarditis Please continue to take your home medication: -Fluconazole(Diflucan) 400 mg daily, that you picked up yesterday from the pharmacy after seeing Dr. Renold Don this week on 8/13.   For your chronic pain Please continue to take your home medication: -Methadone 120 mg that you receive at the [facility].  -Pregabalin 20 MG/ML Soln, Take 40  mg by mouth 3 (three) times daily   For your anxiety Please continue to take your home medications: -Hydroxyzine (Atarax) 10 mg / 5 mL, take 5 mL by mouth at bedtime as needed for anxiety -Sertraline (Zoloft) 20 MG/ML concentrated solution , Mix 1.25 mLs (25 mg total) with 1/2 cup of water and take by mouth daily.   If you have any of these following symptoms, please call us or seek care at an emergency department: -Chest Pain -Difficulty Breathing -Worsening abdominal pain -Syncope (passing out) -Drooping of face -Slurred speech -Sudden weakness in your leg or arm -Fever -Chills -blood in the stool -dark black, sticky stool  I am glad you are feeling better. It was a pleasure taking care for you. I wish a good recovery and good health!   David Kunath, DO, PGY1   Increase  activity slowly   Complete by: As directed        SUBJECTIVE:  Patient is evaluated bedside, he is lying comfortably.  Patient denies any trouble swallowing.  Reports he is doing well.  Patient denies any pain, numbness, tingling, weakness on the right side of his face.  Patient reports right-sided facial swelling has improved. Patient denies any headaches, blurring of vision. Reports bowel movement last night. At this point no other questions or concerns.   Discharge Vitals:   BP 103/73 (BP Location: Right Arm)   Pulse 69   Temp 98.4 F (36.9 C) (Oral)   Resp 16   SpO2 97%   OBJECTIVE:  Physical Exam   Constitutional: cachetic male. Laying comfortably, in no acute distress HENT: Edema and erythema of R cheek and R periorbital region has improved, non tender. +dental carries.  Eyes: conjunctiva non-erythematous Neck: trach in place, no drainage noted.   Cardiovascular: regular rate and rhythm, no m/r/g Pulmonary/Chest: normal work of breathing on room air, lungs clear to auscultation bilaterally Abdominal: soft, non-tender, non-distended, Bowel sound present MSK: normal bulk and tone Neurological: alert & oriented x 3, no focal deficit Skin: warm and dry Psych: normal mood and affect   Pertinent Labs, Studies, and Procedures:     Latest Ref Rng & Units 09/12/2022   12:36 PM 09/06/2022    1:43 AM 09/05/2022    6:12 AM  CBC  WBC 4.0 - 10.5 K/uL 6.8  3.6  3.1   Hemoglobin 13.0 - 17.0 g/dL 16.1  09.6  04.5   Hematocrit 39.0 - 52.0 % 37.5  34.7  38.1   Platelets 150 - 400 K/uL 207  134  85        Latest Ref Rng & Units 09/12/2022   12:36 PM 09/05/2022    6:12 AM 09/04/2022   12:04 PM  CMP  Glucose 70 - 99 mg/dL 66  78  409   BUN 6 - 20 mg/dL 5  6  11    Creatinine 0.61 - 1.24 mg/dL 8.11  9.14  7.82   Sodium 135 - 145 mmol/L 136  135  135   Potassium 3.5 - 5.1 mmol/L 3.6  3.9  4.0   Chloride 98 - 111 mmol/L 97  99  102   CO2 22 - 32 mmol/L 25  29  26    Calcium 8.9 - 10.3  mg/dL 9.0  8.7  8.5   Total Protein 6.5 - 8.1 g/dL 7.4  6.3    Total Bilirubin 0.3 - 1.2 mg/dL 0.6  0.5    Alkaline Phos 38 - 126 U/L 56  57  AST 15 - 41 U/L 49  47    ALT 0 - 44 U/L 30  34      CT ANGIO HEAD NECK W WO CM  Result Date: 09/13/2022 CLINICAL DATA:  Initial evaluation for right internal jugular vein occlusion, swelling. EXAM: CT ANGIOGRAPHY HEAD AND NECK WITH AND WITHOUT CONTRAST TECHNIQUE: Multidetector CT imaging of the head and neck was performed using the standard protocol during bolus administration of intravenous contrast. Multiplanar CT image reconstructions and MIPs were obtained to evaluate the vascular anatomy. Carotid stenosis measurements (when applicable) are obtained utilizing NASCET criteria, using the distal internal carotid diameter as the denominator. RADIATION DOSE REDUCTION: This exam was performed according to the departmental dose-optimization program which includes automated exposure control, adjustment of the mA and/or kV according to patient size and/or use of iterative reconstruction technique. CONTRAST:  75mL OMNIPAQUE IOHEXOL 350 MG/ML SOLN COMPARISON:  Prior study from 09/04/2022. FINDINGS: CT HEAD FINDINGS Brain: Examination severely limited by positioning. No acute intracranial hemorrhage. No visible acute large vessel territory infarct. No mass lesion or midline shift. No hydrocephalus or extra-axial fluid collection. Vascular: No abnormal hyperdense vessel. Skull: Scalp soft tissues demonstrate no acute finding. Calvarium intact. Sinuses/Orbits: Visualized globes and orbital soft tissues grossly within normal limits. Mild-to-moderate mucosal thickening noted about the visualized paranasal sinuses. Other: No mastoid effusion. Review of the MIP images confirms the above findings CTA NECK FINDINGS Aortic arch: Examination severely limited by positioning. Visualized arch within normal limits for caliber with standard 3 vessel morphology. Mild atheromatous change  about the arch without hemodynamically significant stenosis. Right carotid system: Right common and internal carotid arteries are tortuous and irregular. No evidence for dissection. Atheromatous change about the right carotid bulb/proximal cervical right ICA without hemodynamically significant greater than 50% stenosis. Left carotid system: Left common and internal carotid arteries are tortuous. No evidence for dissection. Mild atheromatous change about the left carotid bulb/proximal left ICA without hemodynamically significant greater than 50% stenosis. Vertebral arteries: Both vertebral arteries arise from the subclavian arteries. No visible proximal subclavian artery stenosis. Left vertebral artery dominant. Vertebral arteries are patent without stenosis or dissection. Skeleton: Evaluation of the osseous structures limited by positioning. No discrete or worrisome osseous lesions. Poor dentition noted. Other neck: Chronic and post treatment changes related to patient's head neck malignancy noted, described on prior neck CT. Tracheostomy tube in place. Asymmetric swelling about the right face/neck. Venous system not well assessed due to timing of the contrast bolus. Upper chest: Emphysema.  No acute finding. Review of the MIP images confirms the above findings CTA HEAD FINDINGS Anterior circulation: Exam limited by positioning and motion. Both internal carotid arteries patent to the termini without stenosis. Left A1 segment dominant and patent. Right A1 hypoplastic and/or absent. Normal anterior communicating complex. Anterior cerebral arteries patent to their distal aspects without visible stenosis. No M1 stenosis or occlusion. No proximal MCA branch occlusion or high-grade stenosis. Distal MCA branches perfused and symmetric. Posterior circulation: Both V4 segments patent without stenosis. Both PICA patent. Basilar patent without stenosis. Superior cerebellar arteries patent bilaterally. Both PCAs primarily  supplied via the basilar well perfused or distal aspects. Venous sinuses: Not well assessed due to timing of the contrast bolus. Anatomic variants: As above.  No aneurysm. Review of the MIP images confirms the above findings IMPRESSION: CT HEAD IMPRESSION: 1. Limited exam due to motion and patient positioning. 2. Grossly negative head CT. No acute intracranial abnormality identified. CTA HEAD AND NECK IMPRESSION: 1. Negative CTA for  large vessel occlusion or other emergent finding. 2. Mild atheromatous change about the carotid bifurcations/proximal ICAs without hemodynamically significant greater than 50% stenosis. 3. Post treatment changes related to patient's head neck malignancy with asymmetric swelling about the right face/neck, similar to prior. Aortic Atherosclerosis (ICD10-I70.0) and Emphysema (ICD10-J43.9). Electronically Signed   By: Rise Mu M.D.   On: 09/13/2022 00:03     Signed: Jeral Pinch, D.O.  Internal Medicine Resident, PGY-1 [facility]  Pager: [phone] 2:58 PM, 09/13/2022

## 2022-09-13 NOTE — Progress Notes (Signed)
   DR Corliss Skains has reviewed recent CTA  Stable lymph node mass at Rt carotid bifurcation. Internal jugular vein remains occluded No NIR needs/recommendations at this time

## 2022-09-13 NOTE — Discharge Instructions (Addendum)
David [name],  He came to the hospital for right face and eye lid swelling.   For your right face swelling, jugular vein occlusion on the right: Please continue to take your blood thinner: -Eliquis (apxiban) 5 mg, take 1 tablet by mouth 2 times daily. -If your swelling of the R face worsens, or you started having blurry vision or pain on the right side of your face, please call [facility] to schedule an appointment.  For your candidal endocarditis Please continue to take your home medication: -Fluconazole(Diflucan) 400 mg daily, that you picked up yesterday from the pharmacy after seeing Dr. Renold Don this week on 8/13.   For your chronic pain Please continue to take your home medication: -Methadone 120 mg that you receive at the [facility].  -Pregabalin 20 MG/ML Soln, Take 40 mg by mouth 3 (three) times daily   For your anxiety Please continue to take your home medications: -Hydroxyzine (Atarax) 10 mg / 5 mL, take 5 mL by mouth at bedtime as needed for anxiety -Sertraline (Zoloft) 20 MG/ML concentrated solution, mix 1.25 mLs (25 mg total) with 1/2 cup of water and take by mouth daily.   If you have any of these following symptoms, please call us or seek care at an emergency department: -Chest Pain -Difficulty Breathing -Worsening abdominal pain -Syncope (passing out) -Drooping of face -Slurred speech -Sudden weakness in your leg or arm -Fever -Chills -blood in the stool -dark black, sticky stool  I am glad you are feeling better. It was a pleasure taking care for you. I wish a good recovery and good health!   Jeral Pinch, DO, PGY1

## 2022-09-16 ENCOUNTER — Telehealth: Payer: Self-pay

## 2022-09-16 ENCOUNTER — Ambulatory Visit: Payer: MEDICAID | Admitting: Physical Therapy

## 2022-09-16 ENCOUNTER — Encounter: Payer: Self-pay | Admitting: Physical Therapy

## 2022-09-16 ENCOUNTER — Other Ambulatory Visit: Payer: Self-pay | Admitting: Student

## 2022-09-16 DIAGNOSIS — M6281 Muscle weakness (generalized): Secondary | ICD-10-CM

## 2022-09-16 DIAGNOSIS — R293 Abnormal posture: Secondary | ICD-10-CM

## 2022-09-16 DIAGNOSIS — M542 Cervicalgia: Secondary | ICD-10-CM

## 2022-09-16 MED ORDER — APIXABAN 5 MG PO TABS: 5.0000 mg | ORAL_TABLET | Freq: Two times a day (BID) | ORAL | 0 refills | Status: DC

## 2022-09-16 NOTE — Transitions of Care (Post Inpatient/ED Visit) (Signed)
   09/16/2022  Name: David [name] MRN: [id number] DOB: 1975-08-25  Today's TOC FU Call Status: Today's TOC FU Call Status:: Successful TOC FU Call Completed TOC FU Call Complete Date: 09/16/22  Transition Care Management Follow-up Telephone Call Date of Discharge: 09/13/22 Discharge Facility: [facility]) Type of Discharge: Inpatient Admission Primary Inpatient Discharge Diagnosis:: embolism How have you been since you were released from the hospital?: Better Any questions or concerns?: No  Items Reviewed: Did you receive and understand the discharge instructions provided?: Yes Medications obtained,verified, and reconciled?: Yes (Medications Reviewed) Any new allergies since your discharge?: No Dietary orders reviewed?: Yes Do you have support at home?: No  Medications Reviewed Today: Medications Reviewed Today     Reviewed by Karena Addison, LPN (Licensed Practical Nurse) on 09/16/22 at 1100  Med List Status: <None>   Medication Order Taking? Sig Documenting Provider Last Dose Status Informant  apixaban (ELIQUIS) 5 MG TABS tablet [id number]  Take 1 tablet (5 mg total) by mouth 2 (two) times daily. Jeral Pinch, DO  Active   fluconazole (DIFLUCAN) 200 MG tablet [id number]  Take 2 tablets (400 mg total) by mouth daily. Jeral Pinch, DO  Active   hydrOXYzine (ATARAX) 10 MG/5ML syrup [id number] No Take 5 mLs (10 mg total) by mouth at bedtime as needed for anxiety. Masters, Katie, DO 09/11/2022 Active Self, Pharmacy Records  methadone (DOLOPHINE) 10 MG/ML solution [id number] No Take 6 mLs (60 mg total) by mouth daily.  Patient taking differently: Take 120 mg by mouth daily.   Glee Arvin, NP 09/11/2022 Active Self, Pharmacy Records           Med Note Estil Daft   Fri Sep 13, 2022  8:34 AM) Benetta Spar, RN at [facility] verified doasge 120mg  daily last dose 09/11/22.  Pregabalin 20 MG/ML SOLN [id number] No Take 40 mg by mouth 3 (three) times daily.  Pickenpack-CousarArty Baumgartner, NP 09/12/2022 Active Self, Pharmacy Records  sertraline (ZOLOFT) 20 MG/ML concentrated solution [id number] No Mix 1.25 mLs (25 mg total)  with 1/2 cup of water and take by mouth daily. Masters, Florentina Addison, DO 09/12/2022 Active Self, Pharmacy Records  Med List Note Erin Fulling, [place], CPhT 09/05/22 0730): [facility] [phone]            Home Care and Equipment/Supplies: Were Home Health Services Ordered?: Yes Name of Home Health Agency:: [facility] Has Agency set up a time to come to your home?: No Any new equipment or medical supplies ordered?: Yes Name of Medical supply agency?: [facility] Were you able to get the equipment/medical supplies?: Yes Do you have any questions related to the use of the equipment/supplies?: No  Functional Questionnaire: Do you need assistance with bathing/showering or dressing?: Yes Do you need assistance with meal preparation?: Yes Do you need assistance with eating?: No Do you have difficulty maintaining continence: No Do you need assistance with getting out of bed/getting out of a chair/moving?: No Do you have difficulty managing or taking your medications?: No  Follow up appointments reviewed: PCP Follow-up appointment confirmed?: No (no avail appts, sent message to staff to schedule) Specialist Hospital Follow-up appointment confirmed?: Yes Date of Specialist follow-up appointment?: 09/16/22 Follow-Up Specialty Provider:: PT Do you need transportation to your follow-up appointment?: No Do you understand care options if your condition(s) worsen?: Yes-patient verbalized understanding    SIGNATURE Karena Addison, LPN [facility] Nurse Health Advisor Direct Dial [phone]

## 2022-09-16 NOTE — Progress Notes (Signed)
Tried calling patient, he did not answer and voicemail box is full.  Will try again tomorrow.

## 2022-09-16 NOTE — Therapy (Addendum)
OUTPATIENT PHYSICAL THERAPY CERVICAL TREATMENT   Patient Name: David [name] MRN: [id number] DOB:[birth year], 47 y.o., male Today's Date: 09/16/2022  END OF SESSION:  PT End of Session - 09/16/22 1359     Visit Number 13    Date for PT Re-Evaluation 09/28/22    Authorization Type new insurance: [place] [employer] authorized 8 visits 08/07/22-09/28/22    Authorization - Visit Number 5    Authorization - Number of Visits 8    PT Start Time 1400    PT Stop Time 1428    PT Time Calculation (min) 28 min    Activity Tolerance Patient tolerated treatment well    Behavior During Therapy WFL for tasks assessed/performed                         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)    Pneumococcal bacteremia 06/09/2021   Pneumothorax    Left lung spontaneous pneumothorax at age 5 yr    Past Surgical History:  Procedure Laterality Date   chest tubes Left    IR GASTROSTOMY TUBE MOD SED  06/18/2021   LAPAROSCOPIC INSERTION GASTROSTOMY TUBE N/A 06/20/2021   Procedure: LAPAROSCOPIC INSERTION GASTROSTOMY TUBE;  Surgeon: Berna Bue, MD;  Location: [facility] OR;  Service: General;  Laterality: N/A;  DOW PATIENT   TRACHEOSTOMY TUBE PLACEMENT N/A 06/09/2021   Procedure: AWAKE TRACHEOSTOMY;  Surgeon: Christia Reading, MD;  Location: [facility] OR;  Service: ENT;  Laterality: N/A;   Patient Active Problem List   Diagnosis Date Noted   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   History of pancytopenia 09/12/2022   Jugular vein thrombosis, right 09/05/2022   Jugular vein occlusion (HCC) 09/04/2022   Jugular vein  occlusion, right (HCC) 09/04/2022   Torticollis 07/04/2022   Evaluation by medical service required 03/19/2022   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   Defective dental restoration 12/31/2021   Chronic periodontitis 12/31/2021   Impacted third molar tooth 12/31/2021   Irritant contact dermatitis associated with digestive stoma 12/25/2021   Irritation around percutaneous endoscopic gastrostomy (PEG) tube site (HCC) 12/25/2021   Leaking PEG tube (HCC) 12/12/2021   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   Continuous tobacco abuse 11/21/2021   Dermatitis associated with moisture 10/16/2021   Back pain 08/16/2021   Candidal endocarditis    History of radiation to head and neck region 07/11/2021   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   Dysgeusia 07/11/2021   S/P percutaneous endoscopic gastrostomy (PEG) tube placement (HCC) 06/24/2021   Pressure injury of skin 06/17/2021   Tracheostomy status (HCC)    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   Heart failure with reduced ejection fraction (HCC) 04/28/2021   Anxiety 04/28/2021   Elevated troponin 04/01/2021   Elevated transaminase level 04/01/2021   Weight loss, unintentional 01/15/2021   Protein-calorie malnutrition, severe (HCC) 01/10/2021   Hypotension 01/08/2021   AKI (acute kidney injury) (HCC) 01/08/2021   Chemotherapy induced nausea and vomiting 01/01/2021   Encounter for dental examination 12/13/2020   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   Teeth missing 12/04/2020   Radiation caries  12/04/2020   Retained tooth root 12/04/2020   Chronic apical periodontitis 12/04/2020   Accretions on teeth 12/04/2020    PCP: Adron Bene, MD   REFERRING PROVIDER: Glee Arvin, NP   REFERRING DIAG: Diagnosis Information  Diagnosis  C32.1 (ICD-10-CM) - Malignant neoplasm of supraglottis (HCC)  Z51.5 (ICD-10-CM) - Palliative care patient  Eval and treat for neck pain   THERAPY  DIAG:  Cervicalgia  Abnormal posture  Muscle weakness (generalized)  Rationale for Evaluation and Treatment: Rehabilitation  ONSET DATE: 1 year history (trach placement)  SUBJECTIVE:                                                                                                                                                                                                         SUBJECTIVE STATEMENT: I am just tired today.   Hand dominance: Right  PERTINENT HISTORY:  Tracheostomy, radiation to neck, malignant neoplasm of supraglottis  PAIN:  Are you having pain? Yes: NPRS scale: 4/10 Pain location: Lt>Rt neck and thoracic spine  Pain description: stiff, throbbing Aggravating factors: overstretching Relieving factors: not moving   PRECAUTIONS: Other: cancer, trach  WEIGHT BEARING RESTRICTIONS: No  FALLS:  Has patient fallen in last 6 months? No  LIVING ENVIRONMENT: Lives with: lives alone Lives in: House/apartment   PLOF: Independent, walking to mailbox  PATIENT GOALS: reduce neck pain, improve mobility   NEXT MD VISIT:   OBJECTIVE:   DIAGNOSTIC FINDINGS:  None   PATIENT SURVEYS:  5/30 NDI: 12/45 NDI 11/45  COGNITION: Overall cognitive status: Within functional limits for tasks assessed  SENSATION: WFL  POSTURE: rounded shoulders, forward head, increased thoracic kyphosis, and flexed trunk   PALPATION: 5/30:  Significant adaptive shortening of Rt SCM, lengthened enough now for Pt to be able to achieve neutral posture from resting in flexion/Rt SB/Lt Rot Over-lengthened and painful Lt cervical paraspinals and upper trap secondary to tone in Rt SCM Very tender SCM beneath jaw   Significant tension and reduced muscle length in bil neck. Pain on Rt vs Lt.    CERVICAL ROM:   Active ROM A/PROM (deg) eval A/ROM A/ROM 08/07/22  Flexion Rests in flexion Rests in  55 deg flexion, Rt SB, Lt Rot:  Able to  Right posture to 15 deg flexion Rests in 30  degrees flexion  Extension  Rests in flexion, Rt SB, Lt Rot: Able to correct to 15 deg flexion    Right lateral flexion Rests in 30 degrees   Rests in 20 degrees sidebending  Left lateral flexion 10 degrees Rt lateral  flexion     Right rotation WFLs From 15 deg flexion, 45  deg  50 degrees- rests in flexion  Left rotation WFLs From 15 deg flexion, 45 deg  70 degrees left- rest in flexion    (Blank rows = not tested)  UPPER EXTREMITY ROM:  UE A/ROM is WFLs without pain  UPPER EXTREMITY MMT:  5/30 Neck strength: able to perform postural correction to near neutral and hold 36 sec x 1 reps  4+/5 bil UE strength, neck strength 4-/5 throughout   TODAY'S TREATMENT:    8/19: In Rt SL: elongation to Lt side of neck only In Rt SL: passive and active assisted Lt gentle scapular mobilization 4-way Pt declined working on postural righting today due to sleepiness  09/11/22: Elongation to Lt side of the neck only.  Pt practiced righting his neck to midline from the supine position No treatment to Rt side of neck today 09/02/22: Supine elongation and passive stretch to Rt neck in supine with P/ROM and overpressure Active assisted stretching supine for neck extension and Lt SB for moving toward level posture - dynamic and brief periods of static holds at near end range Trigger Point Dry-Needling  Treatment instructions: Expect mild to moderate muscle soreness. S/S of pneumothorax if dry needled over a lung field, and to seek immediate medical attention should they occur. Patient verbalized understanding of these instructions and education.  Patient Consent Given: Yes Education handout provided: Previously provided Muscles treated: bil upper trap and cervical multifidi Electrical stimulation performed: No Parameters: N/A Treatment response/outcome: twitch and elongation  Seated in chair postural righting active assisted and active 5 rounds 10 sec holds                                                          PATIENT EDUCATION:  Education details: Access Code: [email or web address] Person educated: Patient Education method: Explanation, Demonstration, and Handouts Education comprehension: verbalized understanding and returned demonstration  HOME EXERCISE PROGRAM: Access Code: [email or web address] URL: [email or web address] Date: 06/19/2022 Prepared by: Loistine Simas Aidynn Krenn  Exercises - Seated Scapular Retraction  - 5 x daily - 7 x weekly - 1 sets - 10 reps - 5 hold - Supine Scapular Retraction  - 3 x daily - 7 x weekly - 1 sets - 10 reps - 5 hold - Seated Correct Posture  - 1 x daily - 7 x weekly - 3 sets - 10 reps - Seated Cervical Retraction  - 1 x daily - 7 x weekly - 3 sets - 10 reps - Supine Chin Tuck  - 1 x daily - 7 x weekly - 3 sets - 10 reps - Seated Cervical Sidebending AROM  - 3 x daily - 7 x weekly - 1 sets - 10 reps - 5-10 hold - Seated Assisted Cervical Rotation with Towel  - 5 x daily - 7 x weekly - 1 sets - 3 reps - 10 hold  ASSESSMENT:  CLINICAL IMPRESSION: PT continues to avoid any manual therapy to Rt side of neck due to presence of Rt sided clot in in his neck.  Pt's mom reported that his eye and Rt side of face were less swollen today compared to last week.  Given limitations for manual therapy and progress to date (MD requested no active treatment to the Rt side of his  neck for now), Pt will likely be d/c'd next visit.  He continues to use over the door traction/pulley device and inflatable neck collar for postural stretching and support.  He is partially compliant with other aspects of HEP for cervical ROM, postural righting and strength.  Session focused on elongation and release to Lt side of neck today.  Pt was very sleepy and declined working on postural strength today so short session.  Pt and his mom are agreeable to d/c next visit.   Pt will continue on blood thinners and be followed closely by the MD regarding this issue.  He continues to be very tight along Rt  cervical spine.    OBJECTIVE IMPAIRMENTS: decreased activity tolerance, increased muscle spasms, impaired flexibility, postural dysfunction, and pain.   ACTIVITY LIMITATIONS: sitting, standing, and reach over head  PARTICIPATION LIMITATIONS: meal prep, driving, and community activity  PERSONAL FACTORS: 1-2 comorbidities: cancer on pallative care, chronic postural dysfunction  are also affecting patient's functional outcome.   REHAB POTENTIAL: Good  CLINICAL DECISION MAKING: Stable/uncomplicated  EVALUATION COMPLEXITY: Moderate   GOALS: Goals reviewed with patient? Yes  SHORT TERM GOALS: Target date: 06/27/2022    Be independent in initial  HEP Baseline:  Goal status: met 5/30  2.  Verbalize and demonstrate postural corrections to improve tolerance for neutral posture and reduced neck strain  Baseline:  Goal status: met 5/30   LONG TERM GOALS: Target date: 09/27/22  Be independent in advanced HEP Baseline:  Goal status: ongoing  3.  Improve NDI to < or = to 7/45 Baseline: 11/45 Goal status: 12/45  3.   Perform independent postural correction and hold eyes fixed forward x 3-4 minutes   Baseline: 2 minutes (08/07/22) Goal status:revised    4.   Report > or = to 60-70% reduction in neck pain due to reduced tension and strain in neck Baseline: 35-50%  Goal status:  revised   5. Rest i < or = to 10-15 degrees Rt sidebending x 3 minutes   Baseline: 20 degrees   Goal Status: NEW    PLAN:  PT FREQUENCY: 1x/week  PT DURATION: 6 weeks  PLANNED INTERVENTIONS: Therapeutic exercises, Therapeutic activity, Neuromuscular re-education, Balance training, Gait training, Patient/Family education, Self Care, Joint mobilization, Aquatic Therapy, Dry Needling, Electrical stimulation, Spinal mobilization, Cryotherapy, Moist heat, Taping, Traction, Manual therapy, and Re-evaluation  PLAN FOR NEXT SESSION:  ERO and d/c next time, Follow MD instructions regarding treatment to Rt  side of neck  Rudy Domek, PT 09/16/22 2:29 PM      PHYSICAL THERAPY DISCHARGE SUMMARY  Visits from Start of Care: 13  Current functional level related to goals / functional outcomes: See above for most current status.  Pt has complicating medical factors that doesn't allow treatment to the Rt side of his neck.  He has HEP in place and devices at home to aid with stretching and posture.     Remaining deficits: Significant tone in Rt neck with postural abnormalities.    Education / Equipment: HEP, posture    Patient agrees to discharge. Patient goals were partially met. Patient is being discharged due to a change in medical status.  Lorrene Reid, PT 10/01/22 12:09 PM   [facility] [address], [address] [place], [place] [address] Phone # [phone] Fax [phone]

## 2022-09-17 LAB — CULTURE, BLOOD (ROUTINE X 2): Culture: NO GROWTH

## 2022-09-17 NOTE — Progress Notes (Signed)
Patient is notified of his results. Due to the tracheostomy, is was difficult to understand if he was ok with sending him to GI for a Hepatis C treatment evaluation or not. His mother was not around to speak with [name] either. Recommend speaking with the patient about hepatis C treatment options  in person on September 10 at his appointment.

## 2022-09-17 NOTE — Progress Notes (Signed)
Internal Medicine Clinic Attending  I was physically present during the key portions of the resident provided service and participated in the medical decision making of patient's management care. I reviewed pertinent patient test results.  The assessment, diagnosis, and plan were formulated together and I agree with the documentation in the resident's note.  Concerning for new increased swelling of the face.  He will be admitted to the hospital for repeat imaging and to make sure not interventions are needed.    Inez Catalina, MD

## 2022-09-23 ENCOUNTER — Encounter (HOSPITAL_COMMUNITY): Payer: Self-pay

## 2022-09-23 ENCOUNTER — Ambulatory Visit: Payer: MEDICAID

## 2022-09-23 ENCOUNTER — Emergency Department (HOSPITAL_COMMUNITY): Payer: MEDICAID

## 2022-09-23 ENCOUNTER — Other Ambulatory Visit: Payer: Self-pay

## 2022-09-23 ENCOUNTER — Emergency Department (HOSPITAL_COMMUNITY)
Admission: EM | Admit: 2022-09-23 | Discharge: 2022-09-23 | Disposition: A | Discharge status: Home / Self Care | Payer: MEDICAID | Attending: Emergency Medicine | Admitting: Emergency Medicine

## 2022-09-23 DIAGNOSIS — Z7901 Long term (current) use of anticoagulants: Secondary | ICD-10-CM | POA: Diagnosis not present

## 2022-09-23 DIAGNOSIS — R1310 Dysphagia, unspecified: Secondary | ICD-10-CM | POA: Diagnosis present

## 2022-09-23 LAB — CBC WITH DIFFERENTIAL/PLATELET
Abs Immature Granulocytes: 0.05 10*3/uL (ref 0.00–0.07)
Basophils Absolute: 0.1 10*3/uL (ref 0.0–0.1)
Basophils Relative: 1 %
Eosinophils Absolute: 0.1 10*3/uL (ref 0.0–0.5)
Eosinophils Relative: 1 %
HCT: 40 % (ref 39.0–52.0)
Hemoglobin: 12.8 g/dL — ABNORMAL LOW (ref 13.0–17.0)
Immature Granulocytes: 1 %
Lymphocytes Relative: 6 %
Lymphs Abs: 0.5 10*3/uL — ABNORMAL LOW (ref 0.7–4.0)
MCH: 28.8 pg (ref 26.0–34.0)
MCHC: 32 g/dL (ref 30.0–36.0)
MCV: 89.9 fL (ref 80.0–100.0)
Monocytes Absolute: 0.5 10*3/uL (ref 0.1–1.0)
Monocytes Relative: 7 %
Neutro Abs: 6.2 10*3/uL (ref 1.7–7.7)
Neutrophils Relative %: 84 %
Platelets: 287 10*3/uL (ref 150–400)
RBC: 4.45 MIL/uL (ref 4.22–5.81)
RDW: 14.6 % (ref 11.5–15.5)
WBC: 7.4 10*3/uL (ref 4.0–10.5)
nRBC: 0.3 % — ABNORMAL HIGH (ref 0.0–0.2)

## 2022-09-23 LAB — COMPREHENSIVE METABOLIC PANEL
ALT: 20 U/L (ref 0–44)
AST: 47 U/L — ABNORMAL HIGH (ref 15–41)
Albumin: 3.5 g/dL (ref 3.5–5.0)
Alkaline Phosphatase: 55 U/L (ref 38–126)
Anion gap: 13 (ref 5–15)
BUN: 33 mg/dL — ABNORMAL HIGH (ref 6–20)
CO2: 28 mmol/L (ref 22–32)
Calcium: 9.7 mg/dL (ref 8.9–10.3)
Chloride: 96 mmol/L — ABNORMAL LOW (ref 98–111)
Creatinine, Ser: 0.94 mg/dL (ref 0.61–1.24)
GFR, Estimated: >60 mL/min (ref >60)
Glucose, Bld: 100 mg/dL — ABNORMAL HIGH (ref 70–99)
Potassium: 3.9 mmol/L (ref 3.5–5.1)
Sodium: 137 mmol/L (ref 135–145)
Total Bilirubin: 0.9 mg/dL (ref 0.3–1.2)
Total Protein: 8.2 g/dL — ABNORMAL HIGH (ref 6.5–8.1)

## 2022-09-23 LAB — MAGNESIUM: Magnesium: 2.2 mg/dL (ref 1.7–2.4)

## 2022-09-23 MED ORDER — DIAZEPAM 1 MG/ML PO SOLN: 2.0000 mg | Freq: Three times a day (TID) | ORAL | 0 refills | Status: DC | PRN

## 2022-09-23 MED ORDER — SODIUM CHLORIDE 0.9 % IV BOLUS
1000.0000 mL | Freq: Once | INTRAVENOUS | Status: AC
  Administered 2022-09-23: 1000 mL via INTRAVENOUS

## 2022-09-23 MED ORDER — DIAZEPAM 5 MG/ML IJ SOLN
2.5000 mg | Freq: Once | INTRAMUSCULAR | Status: AC
  Administered 2022-09-23: 2.5 mg via INTRAVENOUS
  Filled 2022-09-23: qty 2

## 2022-09-23 NOTE — ED Notes (Signed)
Pt provided with AVS.  Education complete; all questions answered.  Pt leaving ED in stable condition at this time via wheelchair with all belongings. 

## 2022-09-23 NOTE — ED Notes (Signed)
Iv team at bedside  

## 2022-09-23 NOTE — ED Notes (Signed)
Patient transported to X-ray 

## 2022-09-23 NOTE — ED Triage Notes (Signed)
Pt states for the past three days every time he eats or drinks it comes out of his nose. Pt states he feels weak since he hasn't been able to eat for the past three days. Pt states th only way fluids or food goes down he has to plug his nose up. Pt states the neck tightness is getting worsex1-2wks.

## 2022-09-23 NOTE — ED Provider Notes (Signed)
EMERGENCY DEPARTMENT AT [facility] Provider Note   CSN: [id number] Arrival date & time: 09/23/22  1352     History  No chief complaint on file.   David [name] is a 47 y.o. male presenting to ED with dysphagia.  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  This is a chronic issue, he has a tracheostomy, but says he can only swallow if he pinches his nose shut, otherwise "it comes up to my nose."  For past 24 hours choking on his pills.  However he IS drinking water, gatorade, and eating, according to him and his mother at bedside.  He says he is here because he feels "dehydrated" and is requesting "medication to help relax my throat."  HPI     Home Medications Prior to Admission medications   Medication Sig Start Date End Date Taking? Authorizing Provider  diazepam (VALIUM) 1 MG/ML solution Take 2 mLs (2 mg total) by mouth every 8 (eight) hours as needed for muscle spasms (Dysphagia). 09/23/22  Yes Jahmarion Popoff, Kermit Balo, MD  apixaban (ELIQUIS) 5 MG TABS tablet Take 1 tablet (5 mg total) by mouth 2 (two) times daily. 09/16/22   Faith Rogue, DO  fluconazole (DIFLUCAN) 200 MG tablet Take 2 tablets (400 mg total) by mouth daily. 09/14/22 10/14/22  Tawkaliyar, Roya, DO  hydrOXYzine (ATARAX) 10 MG/5ML syrup Take 5 mLs (10 mg total) by mouth at bedtime as needed for anxiety. 09/06/22   Masters, Katie, DO  methadone (DOLOPHINE) 10 MG/ML solution Take 6 mLs (60 mg total) by mouth daily. Patient taking differently: Take 120 mg by mouth daily. 01/08/21   Pickenpack-Cousar, Arty Baumgartner, NP  Pregabalin 20 MG/ML SOLN Take 40 mg by mouth 3 (three) times daily. 09/11/22   Pickenpack-Cousar, Arty Baumgartner, NP  sertraline (ZOLOFT) 20 MG/ML concentrated solution Mix 1.25 mLs (25 mg total)  with 1/2 cup of water and take by mouth daily. 09/06/22   Masters,  Katie, DO      Allergies    Patient has no known allergies.    Review of Systems   Review of Systems  Physical Exam Updated Vital Signs BP 114/87 (BP Location: Right Arm)   Pulse 65   Temp 97.6 F (36.4 C) (Oral)   Resp 19   Ht 5\' 10"  (1.778 m)   Wt 46.4 kg   SpO2 100%   BMI 14.68 kg/m  Physical Exam Constitutional:      General: He is not in acute distress.    Comments: Thin, frail appearing Tracheostomy in place Swallowing saliva  HENT:     Head: Normocephalic and atraumatic.  Eyes:     Conjunctiva/sclera: Conjunctivae normal.     Pupils: Pupils are equal, round, and reactive to light.  Cardiovascular:     Rate and Rhythm: Normal rate and regular rhythm.  Pulmonary:     Effort: Pulmonary effort is normal. No respiratory distress.  Abdominal:     General: There is no distension.     Tenderness: There is no abdominal tenderness.  Skin:    General: Skin is warm and dry.  Neurological:     General: No focal deficit present.     Mental Status: He is alert. Mental status is at baseline.  Psychiatric:        Mood and Affect: Mood normal.        Behavior: Behavior  normal.     ED Results / Procedures / Treatments   Labs (all labs ordered are listed, but only abnormal results are displayed) Labs Reviewed  CBC WITH DIFFERENTIAL/PLATELET - Abnormal; Notable for the following components:      Result Value   Hemoglobin 12.8 (*)    nRBC 0.3 (*)    Lymphs Abs 0.5 (*)    All other components within normal limits  COMPREHENSIVE METABOLIC PANEL - Abnormal; Notable for the following components:   Chloride 96 (*)    Glucose, Bld 100 (*)    BUN 33 (*)    Total Protein 8.2 (*)    AST 47 (*)    All other components within normal limits  MAGNESIUM    EKG None  Radiology DG Chest 2 View  Result Date: 09/23/2022 CLINICAL DATA:  Difficulty breathing. EXAM: CHEST - 2 VIEW COMPARISON:  August 10, 2021. FINDINGS: The heart size and mediastinal contours are within normal  limits. Tracheostomy tube is unchanged position. Postoperative changes noted in left lung apex. No acute pulmonary disease is noted. The visualized skeletal structures are unremarkable. IMPRESSION: No acute cardiopulmonary abnormality seen. Electronically Signed   By: Lupita Raider M.D.   On: 09/23/2022 16:31    Procedures Procedures    Medications Ordered in ED Medications  sodium chloride 0.9 % bolus 1,000 mL (0 mLs Intravenous Stopped 09/23/22 1750)  diazepam (VALIUM) injection 2.5 mg (2.5 mg Intravenous Given 09/23/22 1645)    ED Course/ Medical Decision Making/ A&P                                 Medical Decision Making Amount and/or Complexity of Data Reviewed Labs: ordered. Radiology: ordered.  Risk Prescription drug management.   Patient here with dysphagia, chronic neck swelling Likely this is a chronic ongoing issue due to proximal tissue swelling, candidiasis  No odynohpagia; doubt abscess/infection of the neck - no indication for CT imaging at this time High aspiration risk however, we'll obtain xray chest while he is here  Records reviewed - discharged from hospital 09/13/22, started on eliquis for jugular DVT at that time.   CT facial tissue scan 09/04/22: impression:  IMPRESSION: 1. 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  2. Extensive dental caries.  Patient is adamant he wants to avoid re-hospitalization if at all possible.  We'll try IV fluids and IV valium which may help with esophageal spasm or dysphagia Need to ensure he can tolerate PO and take his oral meds, including diflucan and eliquis Patient understands that valium is a narcotic and would be prescribed as a SHORT-term medication only, to be used AS NEEDED and not  regularly.  Dg chest and labs ordered and reviewed personally, significant for no emergent findings  Patient was reassessed after his IV medications.  He says he was able to swallow and eat the meal at the bedside.  At this point I do think he is stable for discharge with outpatient follow-up.  We will try some liquid Valium to use as needed at home, as I understand the significant discomfort he is having with his neck edema and dysphagia.  But he is stable for discharge at this time.  His mother is present to take  him home  Supplemental history provided by patient's mother at bedside        Final Clinical Impression(s) / ED Diagnoses Final diagnoses:  Dysphagia, unspecified type    Rx / DC Orders ED Discharge Orders          Ordered    diazepam (VALIUM) 1 MG/ML solution  Every 8 hours PRN        09/23/22 1920              Terald Sleeper, MD 09/23/22 2021

## 2022-09-23 NOTE — ED Notes (Signed)
Awaiting patient from lobby 

## 2022-09-24 ENCOUNTER — Ambulatory Visit (HOSPITAL_COMMUNITY)
Admission: RE | Admit: 2022-09-24 | Discharge: 2022-09-24 | Disposition: A | Discharge status: Home / Self Care | Payer: MEDICAID | Source: Ambulatory Visit | Attending: Internal Medicine

## 2022-09-24 DIAGNOSIS — F1910 Other psychoactive substance abuse, uncomplicated: Secondary | ICD-10-CM | POA: Insufficient documentation

## 2022-09-24 DIAGNOSIS — I38 Endocarditis, valve unspecified: Secondary | ICD-10-CM | POA: Insufficient documentation

## 2022-09-24 LAB — ECHOCARDIOGRAM COMPLETE
Area-P 1/2: 4.15 cm2
S' Lateral: 2.6 cm

## 2022-10-08 ENCOUNTER — Ambulatory Visit: Payer: MEDICAID | Admitting: Student

## 2022-10-08 ENCOUNTER — Encounter: Payer: Self-pay | Admitting: Student

## 2022-10-08 VITALS — BP 94/72 | HR 71 | Temp 98.4°F | Ht 71.0 in | Wt 97.8 lb

## 2022-10-08 DIAGNOSIS — I82811 Embolism and thrombosis of superficial veins of right lower extremities: Secondary | ICD-10-CM | POA: Diagnosis not present

## 2022-10-08 DIAGNOSIS — I82C11 Acute embolism and thrombosis of right internal jugular vein: Secondary | ICD-10-CM

## 2022-10-08 DIAGNOSIS — B1920 Unspecified viral hepatitis C without hepatic coma: Secondary | ICD-10-CM | POA: Diagnosis not present

## 2022-10-08 DIAGNOSIS — M79605 Pain in left leg: Secondary | ICD-10-CM | POA: Diagnosis not present

## 2022-10-08 MED ORDER — NAPROXEN 500 MG PO TABS: 500.0000 mg | ORAL_TABLET | Freq: Two times a day (BID) | ORAL | 0 refills | Status: AC

## 2022-10-08 NOTE — Patient Instructions (Addendum)
Thank you so much for coming to the clinic today!   I am sending in an anti-inflammatory medication to help with that leg pain.  I am also sending a referral to physical therapy.  Glad you are able to eat and drink well.  I will also put in infectious disease referral for the hepatitis C.  If you have any questions please feel free to the call the clinic at anytime at [phone]. It was a pleasure seeing you!  Best, Dr. Thomasene Ripple

## 2022-10-09 DIAGNOSIS — B1920 Unspecified viral hepatitis C without hepatic coma: Secondary | ICD-10-CM | POA: Insufficient documentation

## 2022-10-09 DIAGNOSIS — M79605 Pain in left leg: Secondary | ICD-10-CM | POA: Insufficient documentation

## 2022-10-09 NOTE — Assessment & Plan Note (Signed)
Patient states he has been having this left leg pain for the last 4 to 5 days.  It is made things difficult for him to walk.  He describes it as sharp pain every time that he moves.  The pain is located in the left anterior thigh.  Low concern for clot at this time.  Patient is also weak at baseline, as BMI is 13.64.  I think chronic deconditioning could also be playing a role here.  Plan: - Referral to physical therapy for strength training - Short course of naproxen for pain

## 2022-10-09 NOTE — Assessment & Plan Note (Signed)
Patient presents for follow-up regarding his right jugular vein occlusion.  No further workup for this can be done per IR.  Overall, he is doing well, and I do not see any signs of increased swelling at this moment.  He is able to tolerate p.o. intake well, and states that his diet is improving.  He is able to communicate well, with no signs of mass effect of the occlusion.

## 2022-10-09 NOTE — Progress Notes (Signed)
CC: Hospital follow-up  HPI:  [name] is a 47 y.o. male living with a history stated below and presents today for hospital follow-up. Please see problem based assessment and plan for additional details.  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 60 tablet 0   diazepam (VALIUM) 1 MG/ML solution Take 2 mLs (2 mg total) by mouth every 8 (eight) hours as needed for muscle spasms (Dysphagia). 100 mL 0   fluconazole (DIFLUCAN) 200 MG tablet Take 2 tablets (400 mg total) by mouth daily.     hydrOXYzine (ATARAX) 10 MG/5ML syrup Take 5 mLs (10 mg total) by mouth at bedtime as needed for anxiety. 118 mL 0   methadone (DOLOPHINE) 10 MG/ML solution Take 6 mLs (60 mg total) by mouth daily. (Patient taking differently: Take 120 mg by mouth daily.) 90 mL 0   Pregabalin 20 MG/ML SOLN Take 40 mg by mouth 3 (three) times daily. 120 mL 1   sertraline (ZOLOFT) 20 MG/ML concentrated solution Mix 1.25 mLs (25 mg total)  with 1/2 cup of water and take by mouth daily. 60 mL 2   No current facility-administered medications on file prior to  visit.    No family history on file.  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)   Sexual activity: Not on file  Other Topics Concern   Not on file  Social History Narrative   Not on file   Social Determinants of Health   Financial Resource Strain: Low Risk  (03/19/2022)   Overall Financial Resource Strain (CARDIA)    Difficulty of Paying Living Expenses: Not hard at all  Food Insecurity: Patient Declined (09/12/2022)   Hunger Vital Sign    Worried About Running Out of Food in the Last Year: Patient declined    Barista in  the Last Year: Patient declined  Transportation Needs: Patient Declined (09/12/2022)   PRAPARE - Administrator, Civil Service (Medical): Patient declined    Lack of Transportation (Non-Medical): Patient declined  Physical Activity: Insufficiently Active (03/19/2022)   Exercise Vital Sign    Days of Exercise per Week: 3 days    Minutes of Exercise per Session: 20 min  Stress: No Stress Concern Present (03/19/2022)   [employer] of Occupational Health - Occupational Stress Questionnaire    Feeling of Stress : Only a little  Social Connections: Socially Isolated (03/19/2022)   Social Connection and Isolation Panel [NHANES]    Frequency of Communication with Friends and Family: More than three times a week    Frequency of Social  Gatherings with Friends and Family: Three times a week    Attends Religious Services: Never    Active Member of Clubs or Organizations: No    Attends Banker Meetings: Never    Marital Status: Divorced  Catering manager Violence: Patient Declined (09/12/2022)   Humiliation, Afraid, Rape, and Kick questionnaire    Fear of Current or Ex-Partner: Patient declined    Emotionally Abused: Patient declined    Physically Abused: Patient declined    Sexually Abused: Patient declined    Review of Systems: ROS negative except for what is noted on the assessment and plan.  Vitals:   10/08/22 1414 10/08/22 1432 10/08/22 1444  BP: 94/72 (!) 76/64 94/72  Pulse: 81 72 71  Temp: 98.4 F (36.9 C)    TempSrc: Oral    SpO2: 100%    Weight: 97 lb 12.8 oz (44.4 kg)    Height: 5\' 11"  (1.803 m)      Physical Exam: Constitutional: Ill-appearing male, in no acute distress HENT: normocephalic atraumatic, mucous membranes dry, Eyes: conjunctiva non-erythematous Neck: supple, trach in place, torticollis to the right side Cardiovascular: regular rate and rhythm, no m/r/g Pulmonary/Chest: normal work of breathing on room air, lungs clear to auscultation bilaterally Abdominal: soft, non-tender, non-distended MSK: normal bulk and tone, mild tenderness to palpation in the left lower extremity thigh   Assessment & Plan:   Jugular vein occlusion, right [facility]) Patient presents for follow-up regarding his right jugular vein occlusion.  No further workup for this can be done per IR.  Overall, he is doing well, and I do not see any signs of increased swelling at this moment.  He is able to tolerate p.o. intake well, and states that his diet is improving.  He is able to communicate well, with no signs of mass effect of the occlusion.  Hepatitis C Tested during last internal medicine center visit.  Results are positive, however is currently not being treated.  Patient does follow-up with infectious  disease doctor, and has appointment with them on November 04, 2022.  Will reach out to Dr. Renold Don to see if we can move his appointment forward.  Left leg pain Patient states he has been having this left leg pain for the last 4 to 5 days.  It is made things difficult for him to walk.  He describes it as sharp pain every time that he moves.  The pain is located in the left anterior thigh.  Low concern for clot at this time.  Patient is also weak at baseline, as BMI is 13.64.  I think chronic deconditioning could also be playing a role here.  Plan: - Referral to physical therapy for strength training - Short course of naproxen for  pain  Patient discussed with Dr. Dewain Penning Giovani Neumeister, M.D. [facility] Internal Medicine, PGY-2 Pager: [phone] Date 10/09/2022 Time 7:27 AM

## 2022-10-09 NOTE — Assessment & Plan Note (Signed)
Tested during last internal medicine center visit.  Results are positive, however is currently not being treated.  Patient does follow-up with infectious disease doctor, and has appointment with them on November 04, 2022.  Will reach out to Dr. Renold Don to see if we can move his appointment forward.

## 2022-10-14 NOTE — Progress Notes (Signed)
Internal Medicine Clinic Attending  Case discussed with the resident at the time of the visit.  We reviewed the resident's history and exam and pertinent patient test results.  I agree with the assessment, diagnosis, and plan of care documented in the resident's note.

## 2022-10-21 NOTE — Progress Notes (Unsigned)
Palliative Medicine [facility]  [phone] [phone]) [phone]   Name: David [name] Date: 10/21/2022 MRN: [id number]  DOB: [birth year]  Patient Care Team: Faith Rogue, DO as PCP - General (Internal Medicine) [name], Lise Auer, RN as Oncology Nurse Navigator [name], Skeet Latch, DO as Consulting Physician (Otolaryngology) Lonie Peak, MD as Consulting Physician (Radiation Oncology) Rachel Moulds, MD as Consulting Physician (Hematology and Oncology) Pickenpack-Cousar, Arty Baumgartner, NP as Nurse Practitioner (Nurse Practitioner)   REASON FOR CONSULTATION: David [name] is a 47 y.o. 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 He was recently hospitalized for several months at [facility] where he received treatment for fungal endocarditis and required emergent tracheostomy and PEG tube. During hospitalization CT of chest showed concerns for residual vs recurrent tumor. Palliative requested to re-engage for ongoing support and goals of care.   SOCIAL HISTORY:     reports that he has been smoking cigarettes. He has never used smokeless tobacco. He reports that he does not currently use alcohol after a past usage of about 12.0 standard drinks of alcohol per week. He reports that he does not currently use drugs after having used the following drugs: IV.  ADVANCE DIRECTIVES:  Patient reports he does not have advanced directives.  Would like to further discuss in the future.  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)    Pneumococcal bacteremia 06/09/2021   Pneumothorax    Left lung spontaneous pneumothorax at age 8 yr    S/P percutaneous endoscopic gastrostomy (PEG) tube placement (HCC) 06/24/2021    PAST SURGICAL HISTORY:  Past Surgical History:  Procedure Laterality Date   chest tubes Left    IR GASTROSTOMY TUBE MOD SED  06/18/2021   LAPAROSCOPIC INSERTION GASTROSTOMY TUBE N/A 06/20/2021   Procedure: LAPAROSCOPIC INSERTION GASTROSTOMY TUBE;  Surgeon: Berna Bue, MD;  Location: [facility] OR;  Service: General;  Laterality: N/A;  DOW PATIENT   TRACHEOSTOMY TUBE PLACEMENT N/A 06/09/2021   Procedure: AWAKE TRACHEOSTOMY;  Surgeon: Christia Reading, MD;  Location: [facility] OR;  Service: ENT;  Laterality: N/A;      ALLERGIES:  has No Known Allergies.  MEDICATIONS:  Current Outpatient Medications  Medication Sig Dispense Refill   apixaban (ELIQUIS) 5 MG TABS tablet Take 1 tablet (5 mg total) by mouth 2 (two) times daily. 60 tablet 0   diazepam (VALIUM) 1 MG/ML solution Take 2 mLs (2 mg total) by mouth every 8 (eight) hours as needed for muscle spasms (Dysphagia). 100 mL 0   hydrOXYzine (ATARAX) 10 MG/5ML syrup Take 5 mLs (10 mg total) by mouth at bedtime as needed for anxiety. 118 mL 0   methadone (DOLOPHINE) 10 MG/ML solution Take 6 mLs (60 mg total) by mouth daily. (Patient taking differently: Take 120 mg by mouth daily.) 90 mL 0  naproxen (NAPROSYN) 500 MG tablet Take 1 tablet (500 mg total) by mouth 2 (two) times daily with a meal for 14 days. 28 tablet 0   Pregabalin 20 MG/ML SOLN Take 40 mg by mouth 3 (three) times daily. 120 mL 1   sertraline (ZOLOFT) 20 MG/ML concentrated  solution Mix 1.25 mLs (25 mg total)  with 1/2 cup of water and take by mouth daily. 60 mL 2   No current facility-administered medications for this visit.    VITAL SIGNS: There were no vitals taken for this visit. There were no vitals filed for this visit.     Estimated body mass index is 13.64 kg/m as calculated from the following:   Height as of 10/08/22: 5\' 11"  (1.803 m).   Weight as of 10/08/22: 97 lb 12.8 oz (44.4 kg).  PERFORMANCE STATUS (ECOG) : 1 - Symptomatic but completely ambulatory  Physical Exam General: NAD, thin, weak appearing  HEENT: Trach/Passy Muir valve in place, neck contracture, right facial edema. redness Cardiovascular: regular rate and rhythm Pulmonary: normal breathing pattern  Abdomen: soft, tender, + bowel sounds Extremities: no edema, some head contracture to right side  Neurological: AAOx3, mood appropriate   IMPRESSION: David [name] presents to clinic today for follow-up. I am concerned about him as he is not his normal self. He is less interactive. Falls asleep in the recliner. [name] reports he is sleeping more often than he is awake over the past week. He is staying at her home due to decline since recent blood clot and hospitalization. David [name] awakens due to coughing and immediately has a large amount of secretions from trach. I assist with trach care. He has an appointment with the [facility] today. I express concerns about his decline with his mother. Patient awakens expressing his anxiousness of getting his trach examined.   Mother shares her anxiety with waking up at night to check in on him often making sure he is still breathing.  Emotional support provided.  Unfortunately at this time David [name] just does not seem like he is rebounding back from recent illness and continues to further decline.  Appetite has been minimal.  Current weight is 95 pounds down from 97 pounds on 9/10, 102 pounds on 8/14. I will let him go to his trach clinic appointment.  I  advised his mother I will plan to call and speak with her later today or tomorrow to further discuss next steps and gain a better understanding of goals of care.  It may be time to consider focusing on comfort as I know we have discussed this in the past if his cells were to further decline.  All questions answered and support provided.  PLAN:  Emotional support. Lidocaine patch to back  Pregabalin 30 mg 3 times daily  I will plan to see patient back in the clinic in 1-2 weeks in collaboration with his other oncology appointments.  I will plan to contact mother by phone over the next several days to engage further in discussions.   Patient and mother expressed understanding and was in agreement with this plan. He also understands that He can call the clinic at any time with any questions, concerns, or complaints.        Any controlled substances utilized were prescribed in the context of palliative care. PDMP has been reviewed.    Visit consisted of counseling and education dealing with the complex and emotionally intense issues of symptom management and palliative care in the setting of serious  and potentially life-threatening illness.Greater than 50%  of this time was spent counseling and coordinating care related to the above assessment and plan.  Willette Alma, AGPCNP-BC  Palliative Medicine Team/[facility]  *Please note that this is a verbal dictation therefore any spelling or grammatical errors are due to the "Dragon Medical One" system interpretation.

## 2022-10-23 ENCOUNTER — Inpatient Hospital Stay: Payer: MEDICAID | Attending: Hematology and Oncology | Admitting: Nurse Practitioner

## 2022-10-23 ENCOUNTER — Ambulatory Visit (HOSPITAL_COMMUNITY)
Admission: RE | Admit: 2022-10-23 | Discharge: 2022-10-23 | Disposition: A | Discharge status: Home / Self Care | Payer: MEDICAID | Source: Ambulatory Visit | Attending: Acute Care | Admitting: Acute Care

## 2022-10-23 ENCOUNTER — Other Ambulatory Visit: Payer: Self-pay | Admitting: Acute Care

## 2022-10-23 ENCOUNTER — Other Ambulatory Visit (HOSPITAL_COMMUNITY): Payer: Self-pay | Admitting: Respiratory Therapy

## 2022-10-23 ENCOUNTER — Other Ambulatory Visit: Payer: Self-pay

## 2022-10-23 ENCOUNTER — Encounter: Payer: Self-pay | Admitting: Nurse Practitioner

## 2022-10-23 VITALS — BP 85/57 | HR 63 | Temp 98.1°F | Resp 16 | Wt 95.4 lb

## 2022-10-23 DIAGNOSIS — Z930 Tracheostomy status: Secondary | ICD-10-CM | POA: Diagnosis present

## 2022-10-23 DIAGNOSIS — E43 Unspecified severe protein-calorie malnutrition: Secondary | ICD-10-CM | POA: Diagnosis present

## 2022-10-23 DIAGNOSIS — R630 Anorexia: Secondary | ICD-10-CM

## 2022-10-23 DIAGNOSIS — Z515 Encounter for palliative care: Secondary | ICD-10-CM

## 2022-10-23 DIAGNOSIS — Z7189 Other specified counseling: Secondary | ICD-10-CM | POA: Diagnosis not present

## 2022-10-23 DIAGNOSIS — C329 Malignant neoplasm of larynx, unspecified: Secondary | ICD-10-CM

## 2022-10-23 DIAGNOSIS — J386 Stenosis of larynx: Secondary | ICD-10-CM | POA: Diagnosis present

## 2022-10-23 DIAGNOSIS — R530 Neoplastic (malignant) related fatigue: Secondary | ICD-10-CM

## 2022-10-23 DIAGNOSIS — G893 Neoplasm related pain (acute) (chronic): Secondary | ICD-10-CM | POA: Diagnosis not present

## 2022-10-23 DIAGNOSIS — J209 Acute bronchitis, unspecified: Secondary | ICD-10-CM | POA: Diagnosis present

## 2022-10-23 DIAGNOSIS — R634 Abnormal weight loss: Secondary | ICD-10-CM

## 2022-10-23 MED ORDER — PREGABALIN 20 MG/ML PO SOLN: 40.0000 mg | Freq: Three times a day (TID) | ORAL | 1 refills | Status: DC

## 2022-10-23 MED ORDER — AMOXICILLIN-POT CLAVULANATE 400-57 MG/5ML PO SUSR: 400.0000 mg | Freq: Two times a day (BID) | ORAL | 0 refills | Status: AC

## 2022-10-23 NOTE — Progress Notes (Signed)
Tracheostomy Procedure Note  David [name] [id number] 06-01-1975  Pre Procedure Tracheostomy Information  Trach Brand: Shiley Size:  4.0 [id number] Style: Uncuffed Secured by: Velcro   Procedure: Trach Changed and Trach CLeaned    Post Procedure Tracheostomy Information  Trach Brand: Shiley Size:  4.0 [id number] Style: Uncuffed Secured by: Velcro   Post Procedure Evaluation:  ETCO2 positive color change from yellow to purple : Yes.   Vital signs:VSS Patients current condition: stable Complications: No apparent complications Trach site exam: clean, dry  Wound care done: 4 x 4 gauze drain Patient did tolerate procedure well.   Education: none  Prescription needs: none    Additional needs: New PMV given to Patent at this visit

## 2022-10-23 NOTE — Progress Notes (Signed)
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 10/26 heme/onc f/u: ET/CT which was suggestive of possible residual disease but exploration and biopsy showed no evidence of malignancy which is very reassuring. Plan was to see onc in a year 11/9 out-pt SLP recommended he continue asp precautions and exercises  11/14 seen by ENT Procedure: Flexible fiberoptic laryngoscopy  "The right pyriform is effaced but no visible tumor seen. The epiglottis is edematous, as is the right aryepiglottic fold. I cannot see any visible ulcers or tumor of the endolarynx although visualization was difficult due to edema. Vocal fold mobility was not seen on the right. Airway very narrow due to the right arytenoid/AE fold edema" 2/13 seen by ENT. Ordered CT but did not see evidence of active disease  2/21 seen trach clinic. Holding off on moving towards decannulation as pt still had evidence of air trapping w/ trach occlusion.  4/18 presents for planned trach change. No sig new events since last visit. Feels like secretions better. Does report decreased neck ROM 5/2 seeing PT starting dry needling and weekly PT 6/6 Still working w/ PT. Nothing new noted from last rad/onc visit.  Last seen in PT end of June. Looks like making slow improvement w/ neck pain. Returns today 7/8 for planned trach change   Review of Systems  Constitutional:  Positive for malaise/fatigue and weight loss. Negative for chills and fever.  HENT:         Neck contraction much worse. Pain tolerable   Respiratory:  Positive for cough and sputum production.        Worse cough thick brown   Cardiovascular:  Negative for chest pain.  Gastrointestinal:  Negative for heartburn.  Genitourinary: Negative.   Musculoskeletal: Negative.   Skin: Negative.   Neurological: Negative.   Psychiatric/Behavioral: Negative.      Exam   General frail 47 yr old chronically ill male. Presents w/ his  mother today. He is in no distress HENT his neck is still contracted w/ ear towards right shoulder. His trach is mid-line. Has #4 cuffless trach. Using PMV voice quality is hoarse but at baseline Pulm clear no accessory use. Room air Card rrr Abd soft Ext no edema   Procedure: the #4 trach was removed. Stoma was assessed and is mid-line and unremarkable . A new 4 trach was placed over obturator. Placement was confirmed via ETCO2. Tolerated well.     Active Problems:   Tracheostomy status (HCC)   Laryngeal cancer (HCC)   Protein-calorie malnutrition, severe (HCC)   Subglottic stenosis  Tracheostomy status (HCC) Overview: Brand: shiley Size: 4 cuffless  Last change in clinic 9/25 prior was  8/9  Discussion Trach is unremarkable. Still has sig air trapping w/ capping valve. I doubt he will ever be decannulated.   Plan ROV 4 weeks Cont PMV as tolerated    End of life care Overview: [name] is declined significantly over the last 6 weeks.  He actually appears to me as though he is actively dying.  He is debilitated, less engaged, weak, he is lost 2 pounds, and he really did not have that weight to lose.  Mom reports she is not eating, sleeping most of the time.  He was seen by palliative today his mom Lanice Schwab tells me that she is waiting to hear from palliative later today.  I have told Misty that I think it is time for [name] to enter hospice.  I am not sure how much longer we have left with him.   Acute purulent bronchitis Overview: Plan Will place him on a 7-day course of Augmentin His mom has my phone number, she will contact me if no improvement over the next 48 to 72 hours    My time 21 min    Simonne Martinet ACNP-BC [facility] Pulmonary/Critical Care Pager # [phone] OR # [phone] if no answer

## 2022-10-24 ENCOUNTER — Ambulatory Visit (INDEPENDENT_AMBULATORY_CARE_PROVIDER_SITE_OTHER): Payer: MEDICAID | Admitting: Internal Medicine

## 2022-10-24 ENCOUNTER — Other Ambulatory Visit: Payer: Self-pay

## 2022-10-24 ENCOUNTER — Encounter: Payer: Self-pay | Admitting: Internal Medicine

## 2022-10-24 VITALS — BP 89/63 | HR 59 | Temp 98.2°F | Resp 16 | Wt 95.4 lb

## 2022-10-24 DIAGNOSIS — R5383 Other fatigue: Secondary | ICD-10-CM

## 2022-10-24 DIAGNOSIS — B376 Candidal endocarditis: Secondary | ICD-10-CM | POA: Diagnosis not present

## 2022-10-24 DIAGNOSIS — B182 Chronic viral hepatitis C: Secondary | ICD-10-CM | POA: Diagnosis not present

## 2022-10-24 LAB — CBC WITH DIFFERENTIAL/PLATELET
Absolute Monocytes: 302 cells/uL (ref 200–950)
Basophils Absolute: 59 cells/uL (ref 0–200)
Basophils Relative: 1.4 %
Eosinophils Absolute: 181 cells/uL (ref 15–500)
Eosinophils Relative: 4.3 %
HCT: 37.9 % — ABNORMAL LOW (ref 38.5–50.0)
Hemoglobin: 11.9 g/dL — ABNORMAL LOW (ref 13.2–17.1)
Lymphs Abs: 655 cells/uL — ABNORMAL LOW (ref 850–3900)
MCH: 28.7 pg (ref 27.0–33.0)
MCHC: 31.4 g/dL — ABNORMAL LOW (ref 32.0–36.0)
MCV: 91.3 fL (ref 80.0–100.0)
MPV: 10.9 fL (ref 7.5–12.5)
Monocytes Relative: 7.2 %
Neutro Abs: 3003 cells/uL (ref 1500–7800)
Neutrophils Relative %: 71.5 %
Platelets: 231 10*3/uL (ref 140–400)
RBC: 4.15 10*6/uL — ABNORMAL LOW (ref 4.20–5.80)
RDW: 13.3 % (ref 11.0–15.0)
Total Lymphocyte: 15.6 %
WBC: 4.2 10*3/uL (ref 3.8–10.8)

## 2022-10-24 NOTE — Patient Instructions (Signed)
Follow up with me in 3 months   Blood cultures today   Check in with your primary care doctor and let her know about your malaise   Make sure you see ENT in 01/2023. This issue likely remains the biggest issue now in terms of your problem   At this time not a good time to consider hepatitis c treatment

## 2022-10-24 NOTE — Addendum Note (Signed)
Addended by: Harley Alto on: 10/24/2022 03:05 PM   Modules accepted: Orders

## 2022-10-24 NOTE — Progress Notes (Signed)
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   Defective dental restoration 12/31/2021   Chronic periodontitis 12/31/2021   Impacted third molar tooth 12/31/2021   Irritant contact dermatitis associated with digestive stoma 12/25/2021   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   Continuous tobacco abuse 11/21/2021   Dermatitis associated with moisture 10/16/2021   Back pain 08/16/2021   Candidal endocarditis    History of radiation to head and neck region 07/11/2021   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   Dysgeusia 07/11/2021   Pressure injury of skin 06/17/2021   Tracheostomy status (HCC)    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   Heart failure with reduced ejection fraction (HCC) 04/28/2021   Anxiety 04/28/2021   Elevated troponin 04/01/2021   Elevated transaminase level 04/01/2021   Weight loss, unintentional 01/15/2021   Protein-calorie malnutrition, severe (HCC) 01/10/2021   Hypotension 01/08/2021   Chemotherapy induced nausea and vomiting 01/01/2021   Encounter for dental examination 12/13/2020   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   MRN: [id number]  No chief complaint on file.   HPI:  David [name] is a 47 y.o. 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 Missed dose yesterday as out of med No side effect No fever, chill, rash  He is in in active f/u with [facility] for what could possibly be recurrent head/neck cancer. He has been in remission 3-4 months  He is being followed by palliative care team and is asking me if I can do with his chronic back pain/trach site pain  ----------------- 11/07/21 id clinic f/u Patient feeling well He has recent pet scan [facility] and this demonstrate some concerning lesion in his throat and this awaits biopsy. He takes 800 mg fluconazole daily and tolerates it well Again at this time no port/central catheter  No hair loss, n/v, rash, ruq pain, jaundice   02/06/22 id f/u Tolerating fluconazole, down to 400 mg once a day, compliant Missing 200 mg here and there sometimes due to size/nausea Otherwise no issue Wants handicap placcard application filled out   05/09/22 id f/u Doing well on fluconazole 400 mg daily No hair loss No active issue going on outside of "trach being annoying."  09/10/22 id f/u Recent admission right face swelling and right internal jugular thrombosis started on doac Discharged on 800 mg fluconazole instead of 400 mg Tolerating fluconazole No bleeding   10/24/22 id f/u  Patient continues on doac for right internal jugular acute  thrombotic event I continued him on fluconazole 400 mg daily He continues to have on repeat echo 09/24/22 a 1.2 cm stable size tricuspid vegetation Our thought was if tte was completely normal potentially trial off fluconazole and see as he had had it for more than a year  He has no complaint today  No Known Allergies    Outpatient  Medications Prior to Visit  Medication Sig Dispense Refill   amoxicillin-clavulanate (AUGMENTIN) 400-57 MG/5ML suspension Take 5 mLs (400 mg total) by mouth 2 (two) times daily for 7 days. 70 mL 0   apixaban (ELIQUIS) 5 MG TABS tablet Take 1 tablet (5 mg total) by mouth 2 (two) times daily. 60 tablet 0   diazepam (VALIUM) 1 MG/ML solution Take 2 mLs (2 mg total) by mouth every 8 (eight) hours as needed for muscle spasms (Dysphagia). 100 mL 0   hydrOXYzine (ATARAX) 10 MG/5ML syrup Take 5 mLs (10 mg total) by mouth at bedtime as needed for anxiety. 118 mL 0   methadone (DOLOPHINE) 10 MG/ML solution Take 6 mLs (60 mg total) by mouth daily. (Patient taking differently: Take 120 mg by mouth daily.) 90 mL 0   Pregabalin 20 MG/ML SOLN Take 40 mg by mouth 3 (three) times daily. 120 mL 1   sertraline (ZOLOFT) 20 MG/ML concentrated solution Mix 1.25 mLs (25 mg total)  with 1/2 cup of water and take by mouth daily. 60 mL 2   No facility-administered medications prior to visit.     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)   Sexual activity: Not on file  Other Topics Concern   Not on file  Social History Narrative   Not on file   Social Determinants of Health   Financial Resource Strain: Low Risk  (03/19/2022)   Overall Financial Resource Strain (CARDIA)    Difficulty of Paying Living  Expenses: Not hard at all  Food Insecurity: Patient Declined (09/12/2022)   Hunger Vital Sign    Worried About Running Out of Food in the Last Year: Patient declined    Ran Out of Food in the Last Year: Patient declined  Transportation Needs: Patient Declined (09/12/2022)   PRAPARE - Administrator, Civil Service (Medical): Patient declined    Lack of Transportation (Non-Medical): Patient declined  Physical Activity: Insufficiently Active (03/19/2022)   Exercise Vital Sign    Days of Exercise per Week: 3 days    Minutes of Exercise per Session: 20 min  Stress: No Stress Concern Present (03/19/2022)   [employer] of Occupational Health - Occupational Stress Questionnaire    Feeling of Stress : Only a little  Social Connections: Socially Isolated (03/19/2022)   Social Connection and  Isolation Panel [NHANES]    Frequency of Communication with Friends and Family: More than three times a week    Frequency of Social Gatherings with Friends and Family: Three times a week    Attends Religious Services: Never    Active Member of Clubs or Organizations: No    Attends Banker Meetings: Never    Marital Status: Divorced  Catering manager Violence: Patient Declined (09/12/2022)   Humiliation, Afraid, Rape, and Kick questionnaire    Fear of Current or Ex-Partner: Patient declined    Emotionally Abused: Patient declined    Physically Abused: Patient declined    Sexually Abused: Patient declined      Review of Systems    All other ros negative  Objective:    There were no vitals taken for this visit. Nursing note and vital signs reviewed.  Physical Exam     There were no vitals taken for this visit.  General/constitutional: no distress, pleasant HEENT: Normocephalic, PER, Conj Clear, EOMI, Oropharynx clear --  decreased right facial swelling better  Neck supple -- trach site clean no erythema/discharge CV: rrr no mrg Lungs: clear to auscultation, normal  respiratory effort Abd: Soft, Nontender Ext: no edema Skin: No Rash Neuro: nonfocal MSK: right sided flexion torticollis chronic    Labs: Lab Results  Component Value Date   WBC 7.4 09/23/2022   HGB 12.8 (L) 09/23/2022   HCT 40.0 09/23/2022   MCV 89.9 09/23/2022   PLT 287 09/23/2022   Last metabolic panel Lab Results  Component Value Date   GLUCOSE 100 (H) 09/23/2022   NA 137 09/23/2022   K 3.9 09/23/2022   CL 96 (L) 09/23/2022   CO2 28 09/23/2022   BUN 33 (H) 09/23/2022   CREATININE 0.94 09/23/2022   GFRNONAA >60 09/23/2022   CALCIUM 9.7 09/23/2022   PHOS 4.6 09/05/2022   PROT 8.2 (H) 09/23/2022   ALBUMIN 3.5 09/23/2022   BILITOT 0.9 09/23/2022   ALKPHOS 55 09/23/2022   AST 47 (H) 09/23/2022   ALT 20 09/23/2022   ANIONGAP 13 09/23/2022    Micro:  Serology:  Imaging: Reviewed   09/24/22 tte  1. Left ventricular ejection fraction, by estimation, is 60 to 65%. The  left ventricle has normal function. The left ventricle has no regional  wall motion abnormalities. Left ventricular diastolic parameters are  consistent with Grade II diastolic  dysfunction (pseudonormalization).   2. Right ventricular systolic function is normal. The right ventricular  size is normal. There is normal pulmonary artery systolic pressure.   3. The mitral valve is normal in structure. No evidence of mitral valve  regurgitation. No evidence of mitral stenosis.   4. Mobile density noted in the right atrium that appears to be attached  to the tricuspid valve. Measures 1.2 x 0.6 cm. Image quality is poor, with  most images obtained in the subcostal window. Suggest TEE to better  evaluate. Similar in appearance to  echo performed 07/2021.   5. The aortic valve is normal in structure. Aortic valve regurgitation is  not visualized. No aortic stenosis is present.   6. The inferior vena cava is normal in size with greater than 50%  respiratory variability, suggesting right atrial pressure  of 3 mmHg.    Assessment & Plan:   Problem List Items Addressed This Visit       Cardiovascular and Mediastinum   Candidal endocarditis - Primary   Relevant Medications   fluconazole (DIFLUCAN) 200 MG tablet   Other  Relevant Orders   CBC w/Diff   COMPLETE METABOLIC PANEL WITH GFR   C-reactive protein   Blood culture (routine single)   Blood culture (routine single)     Digestive   Hepatitis C   Relevant Medications   fluconazole (DIFLUCAN) 200 MG tablet   Other Relevant Orders   CBC w/Diff   COMPLETE METABOLIC PANEL WITH GFR   C-reactive protein   Blood culture (routine single)   Blood culture (routine single)   Other Visit Diagnoses     Other fatigue       Relevant Orders   CBC w/Diff   COMPLETE METABOLIC PANEL WITH GFR   C-reactive protein   Blood culture (routine single)   Blood culture (routine single)        No orders of the defined types were placed in this encounter.    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  10/24/22 id clinic assessment I reviewed oncology/ent note 08/2021  right neck lymph node biopsy showed atypical squamous cell "in proper clinical context, could be consistent with SCC, but dx can't be definitively made on current specimen; no lymphnode tissue identified)  He had pet avid lesion upper esophagus and posterior aspect left cricoid cartilage and along anterior aspect right cricoarytenoid cartilage, but no mass on pet ct 10/25/21. He underwent right piriform biopsy 11/08/2021 at [facility] --> negative for high-grade dysplasia/carcinoma; squamous mucosa with fibrosis  02/2022 last hem/onc visit at [facility] --> family worried about intermittent swelling on the neck --> "may benefit from imaging to clarify. And recommend patient to see medical oncology at [facility] for additional recommendation." "Not a candidate for any  form of chemotherapy."  08/03/22 ent flexible laryngoscopy done --> "no evidence of disease on exam". Next f/u 01/2023 or sooner as needed   2 weeks decreased appetite and more sleepy -- discuss biggest elephant in the room is the ongoing question of recurrence of his ENT cancer. There is no sign of relapse fungal infection     With regard to his fungal tv endocarditis: Tte 09/24/22 still showed tv vegetation, but stable size Discuss with him while the thrombus alone doesn't suggestive active infection. Minimal data on discontinuing and observe off abx. So given persistent vegetation, and known high risk relapse, will keep on fluconazole indefinitely. The minimum dose will be fluconazole 400 mg daily   Chronic hep c -- last viral load 09/12/22 1.5 million unit. He asked about treatment 11/2020 hep b sAg and c Igm were negative We discussed that it is not a good time now to consider hep c treatment given the uncertainty in the head/neck cancer and ddi with eliquis already with fluconazole   Sinusitis sx - have been having chronic nasal drainage. He was given a week amox-clav for his sinus. I am not sure it is warranted or will change anything  Blood pressure chronically on the loewr side. Heart rate not fast   -not sure why the malaise. Could be viral process. No sign/sx of neck swelling that is worse. No other localizing sx of infection. Doubt breakthrough candida endocarditis -will check labs and bcx -advise to monitor neck sx/swelling (no swelling reported outside of right face swelling from dvt that is decreasing)  I have spent a total of 60 minutes of face-to-face and non-face-to-face time, excluding clinical staff time, preparing to see patient, ordering tests and/or medications, and provide counseling  the patient   Follow-up: Return in about 3 months (around 01/23/2023).      Raymondo Band, MD [facility] 10/24/2022, 2:10  PM

## 2022-10-24 NOTE — Progress Notes (Signed)
Palliative Medicine [facility]  Telephone:([phone]) [phone] [phone]) [phone]   Name: David [name] Date: 10/24/2022 MRN: [id number]  DOB: [birth year]  Patient Care Team: Faith Rogue, DO as PCP - General (Internal Medicine) [name], Lise Auer, RN as Oncology Nurse Navigator [name], Skeet Latch, DO as Consulting Physician (Otolaryngology) Lonie Peak, MD as Consulting Physician (Radiation Oncology) Rachel Moulds, MD as Consulting Physician (Hematology and Oncology) Pickenpack-Cousar, Arty Baumgartner, NP as Nurse Practitioner (Nurse Practitioner)   I connected with David [name] on 10/24/22 at 11:00 AM EDT by phoneand verified that I am speaking with the correct person using two identifiers.   I discussed the limitations, risks, security and privacy concerns of performing an evaluation and management service by telemedicine and the availability of in-person appointments. I also discussed with the patient that there may be a patient responsible charge related to this service. The patient expressed understanding and agreed to proceed.   Other persons participating in the visit and their role in the encounter: [name], mother   Patient's location: home  Provider's location: [facility]   Chief Complaint: f/u of symptom management   REASON FOR CONSULTATION: David [name] is a 47 y.o. 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 He was recently hospitalized for several months at [facility] where he received treatment for fungal endocarditis and required emergent tracheostomy and PEG tube. During hospitalization CT of chest showed concerns for residual vs recurrent tumor. Palliative requested to re-engage for ongoing support and goals of care.    SOCIAL HISTORY:     reports that he has been smoking cigarettes. He has never used smokeless tobacco. He reports that he does not currently use alcohol after a past usage of about 12.0 standard drinks of alcohol per week. He reports that he does not currently use drugs after having used the following drugs: IV.  ADVANCE DIRECTIVES:  Patient reports he does not have advanced directives.  Would like to further discuss in the future.  CODE STATUS: Full code  PAST MEDICAL HISTORY: 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)    Pneumococcal bacteremia 06/09/2021   Pneumothorax    Left lung spontaneous pneumothorax at age 66 yr    S/P percutaneous endoscopic gastrostomy (PEG) tube placement (HCC) 06/24/2021    PAST SURGICAL HISTORY:  Past Surgical History:  Procedure Laterality Date   chest tubes Left    IR GASTROSTOMY TUBE MOD SED  06/18/2021   LAPAROSCOPIC INSERTION GASTROSTOMY TUBE N/A 06/20/2021   Procedure: LAPAROSCOPIC INSERTION GASTROSTOMY TUBE;  Surgeon: Berna Bue, MD;  Location: [facility] OR;  Service: General;  Laterality: N/A;  DOW PATIENT   TRACHEOSTOMY TUBE PLACEMENT N/A 06/09/2021   Procedure: AWAKE TRACHEOSTOMY;  Surgeon: Christia Reading, MD;  Location: [facility] OR;  Service: ENT;  Laterality: N/A;      ALLERGIES:  has No Known Allergies.  MEDICATIONS:  Current Outpatient Medications  Medication Sig Dispense Refill   amoxicillin-clavulanate (AUGMENTIN) 400-57 MG/5ML suspension Take 5  mLs (400 mg total) by mouth 2 (two) times daily for 7 days. 70 mL 0   apixaban (ELIQUIS) 5 MG TABS tablet Take 1 tablet (5 mg total) by mouth 2 (two) times daily. 60 tablet 0   diazepam (VALIUM) 1 MG/ML solution Take 2 mLs (2 mg total) by mouth every 8 (eight) hours as needed for muscle spasms (Dysphagia). 100 mL 0   fluconazole (DIFLUCAN) 200 MG tablet Take 200 mg by mouth daily.     hydrOXYzine (ATARAX) 10 MG/5ML syrup Take 5 mLs (10 mg total) by mouth at bedtime as needed for anxiety. 118 mL 0   methadone (DOLOPHINE) 10 MG/ML solution Take 6 mLs (60 mg total) by mouth daily. (Patient taking differently: Take 120 mg by mouth daily.) 90 mL 0   Pregabalin 20 MG/ML SOLN Take 40 mg by mouth 3 (three) times daily. 120 mL 1   sertraline (ZOLOFT) 20 MG/ML concentrated solution Mix 1.25 mLs (25 mg total)  with 1/2 cup of water and take by mouth daily. 60 mL 2   No current facility-administered medications for this visit.    VITAL SIGNS: There were no vitals taken for this visit. There were no vitals filed for this visit.     Estimated body mass index is 13.31 kg/m as calculated from the following:   Height as of 10/08/22: 5\' 11"  (1.803 m).   Weight as of 10/24/22: 95 lb 6.4 oz (43.3 kg).  PERFORMANCE STATUS (ECOG) : 1 - Symptomatic but completely ambulatory    IMPRESSION: I connected with Mrs. Neidlinger by phone. Patient is resting. We discussed at length patient;'s recent decline. [name] is emotional expressing her worry for patient's constant fatigue, sleeping all the time, and decrease in appetite. Some days are better than others. He will awaken and eat some and fall back to sleep.   He has been up today and ate pancakes from [employer]. She shares discussions with providers this week who also expressed concerns regarding his overall status and prognosis.   His mother states she is scared that something will happen to him. Her nightly routine is to get up during the night to check and make  sure he is breathing. She knows he is not feeling well as he requested to come stay with her for some time which is not his norm. She is planning to reach out to his ENT and provider who he follows at [facility] for a follow-up for evaluation of disease reoccurrence. He has an appointment with Oncology and Radiation in the next months. We discussed considering moving those appointments up to make sure there is not reoccurrence. She would like to follow-up with [facility] first before moving up other appointments.   We discussed focusing on [name]'s comfort if he continues to show signs of decline or no improvement. Mrs. Trullinger is emotional. Support provided. She shares [name] "hates" the word hospice. Does not like for it to be mentioned and she knows as long as he is lucid would not agree to this based on previous experiences. We discussed the goals and philosophy of hospice while streamlining previous experience. She would be in agreement if goals were for comfort as she does not want him to suffer but also wants to support him in a way that he is at peace and is comfortable. She plans to take things one day at a time for now. Follow-up with  oncology team to rule out any underlying cancer reoccurrence. She knows if he continues to decline to seek medical attention or contact office if goal becomes clear to focus on comfort.   All questions answered and support provided.   PLAN:  Emotional support. Ongoing goals of care discussions. I will plan to see patient back in the clinic in as needed in collaboration with his other oncology appointments.  Mother knows to contact office.    Patient and mother expressed understanding and was in agreement with this plan. He also understands that He can call the clinic at any time with any questions, concerns, or complaints.        Any controlled substances utilized were prescribed in the context of palliative care. PDMP has been reviewed.    Visit consisted of counseling  and education dealing with the complex and emotionally intense issues of symptom management and palliative care in the setting of serious and potentially life-threatening illness.Greater than 50%  of this time was spent counseling and coordinating care related to the above assessment and plan.  Willette Alma, AGPCNP-BC  Palliative Medicine Team/[facility]  *Please note that this is a verbal dictation therefore any spelling or grammatical errors are due to the "[employer]agon [employer] One" system interpretation.

## 2022-10-25 ENCOUNTER — Inpatient Hospital Stay (HOSPITAL_BASED_OUTPATIENT_CLINIC_OR_DEPARTMENT_OTHER): Payer: MEDICAID | Admitting: Nurse Practitioner

## 2022-10-25 ENCOUNTER — Encounter: Payer: Self-pay | Admitting: Nurse Practitioner

## 2022-10-25 DIAGNOSIS — Z515 Encounter for palliative care: Secondary | ICD-10-CM

## 2022-10-25 DIAGNOSIS — R530 Neoplastic (malignant) related fatigue: Secondary | ICD-10-CM | POA: Diagnosis not present

## 2022-10-25 DIAGNOSIS — Z7189 Other specified counseling: Secondary | ICD-10-CM | POA: Diagnosis not present

## 2022-10-25 DIAGNOSIS — C321 Malignant neoplasm of supraglottis: Secondary | ICD-10-CM | POA: Diagnosis not present

## 2022-10-30 LAB — COMPLETE METABOLIC PANEL WITH GFR
AG Ratio: 1.1 (calc) (ref 1.0–2.5)
ALT: 17 U/L (ref 9–46)
AST: 32 U/L (ref 10–40)
Albumin: 4 g/dL (ref 3.6–5.1)
Alkaline phosphatase (APISO): 67 U/L (ref 36–130)
BUN: 15 mg/dL (ref 7–25)
CO2: 30 mmol/L (ref 20–32)
Calcium: 9.5 mg/dL (ref 8.6–10.3)
Chloride: 101 mmol/L (ref 98–110)
Creat: 0.78 mg/dL (ref 0.60–1.29)
Globulin: 3.7 g/dL (ref 1.9–3.7)
Glucose, Bld: 99 mg/dL (ref 65–99)
Potassium: 4.6 mmol/L (ref 3.5–5.3)
Sodium: 138 mmol/L (ref 135–146)
Total Bilirubin: 0.4 mg/dL (ref 0.2–1.2)
Total Protein: 7.7 g/dL (ref 6.1–8.1)
eGFR: 111 mL/min/{1.73_m2} (ref >=60)

## 2022-10-30 LAB — CULTURE, BLOOD (SINGLE): MICRO NUMBER:: 15521641

## 2022-10-30 LAB — C-REACTIVE PROTEIN: CRP: 32.3 mg/L — ABNORMAL HIGH (ref <8.0)

## 2022-11-04 ENCOUNTER — Telehealth: Payer: Self-pay

## 2022-11-04 ENCOUNTER — Ambulatory Visit: Payer: MEDICAID | Admitting: Internal Medicine

## 2022-11-04 NOTE — Telephone Encounter (Signed)
Pt mother called and LVM with no details, attempted to call back, no answer, LVM and callback number

## 2022-11-07 ENCOUNTER — Other Ambulatory Visit: Payer: Self-pay | Admitting: Acute Care

## 2022-11-07 MED ORDER — ONDANSETRON HCL 4 MG PO TABS: 4.0000 mg | ORAL_TABLET | Freq: Three times a day (TID) | ORAL | 1 refills | Status: DC | PRN

## 2022-11-07 NOTE — Progress Notes (Signed)
I spoke to [name] mom via phone.  Since our last visit he has continued to decline. On mon the 7th he was confused, would not eat, has gotten weaker. We discussed on that day my recommendation to call EMS and bring to the hospital. David [name] continued to refuse this care.  I called today 10/10 as a follow up. David [name] is still not eating, not really drinking, he continues to weaken and is now having nausea. He otherwise seems comfortable and is no longer confused but just seems to still be declining.  We discussed really two options I think are reasonable.  1) home hospice or 2) admit and hydrate. He is a DNR. I confirmed that w/ [name] and fully agree that David [name] should NEVER be placed on life support.  Plan I placed a order for zofran for his nausea He has a follow up scheduled on 16th w/ Dr Ninetta Lights. I will forward my thoughts to him. David [name] is unlikely to agree to residential hospice but I do it's probably time to bring in home hospice soon as he continues to decline  his ability to make out pt appointments or really even get his chronic pain addressed will be challenging   Simonne Martinet ACNP-BC [facility] Pulmonary/Critical Care Pager # [phone] OR # [phone] if no answer

## 2022-11-08 ENCOUNTER — Other Ambulatory Visit: Payer: Self-pay

## 2022-11-08 ENCOUNTER — Inpatient Hospital Stay: Payer: MEDICAID | Attending: Hematology and Oncology | Admitting: Nurse Practitioner

## 2022-11-08 ENCOUNTER — Inpatient Hospital Stay: Payer: MEDICAID

## 2022-11-08 ENCOUNTER — Other Ambulatory Visit: Payer: Self-pay | Admitting: Student

## 2022-11-08 ENCOUNTER — Inpatient Hospital Stay (HOSPITAL_BASED_OUTPATIENT_CLINIC_OR_DEPARTMENT_OTHER): Payer: MEDICAID | Admitting: Nurse Practitioner

## 2022-11-08 ENCOUNTER — Encounter: Payer: Self-pay | Admitting: Nurse Practitioner

## 2022-11-08 VITALS — BP 127/91 | HR 86 | Temp 98.2°F | Resp 17 | Wt 88.1 lb

## 2022-11-08 DIAGNOSIS — M62838 Other muscle spasm: Secondary | ICD-10-CM | POA: Diagnosis not present

## 2022-11-08 DIAGNOSIS — Z515 Encounter for palliative care: Secondary | ICD-10-CM | POA: Diagnosis not present

## 2022-11-08 DIAGNOSIS — C321 Malignant neoplasm of supraglottis: Secondary | ICD-10-CM

## 2022-11-08 DIAGNOSIS — R630 Anorexia: Secondary | ICD-10-CM

## 2022-11-08 DIAGNOSIS — Z86718 Personal history of other venous thrombosis and embolism: Secondary | ICD-10-CM | POA: Insufficient documentation

## 2022-11-08 DIAGNOSIS — C328 Malignant neoplasm of overlapping sites of larynx: Secondary | ICD-10-CM | POA: Diagnosis present

## 2022-11-08 DIAGNOSIS — Z930 Tracheostomy status: Secondary | ICD-10-CM | POA: Diagnosis not present

## 2022-11-08 DIAGNOSIS — R634 Abnormal weight loss: Secondary | ICD-10-CM

## 2022-11-08 DIAGNOSIS — Z7189 Other specified counseling: Secondary | ICD-10-CM

## 2022-11-08 DIAGNOSIS — C329 Malignant neoplasm of larynx, unspecified: Secondary | ICD-10-CM | POA: Diagnosis not present

## 2022-11-08 DIAGNOSIS — Z1329 Encounter for screening for other suspected endocrine disorder: Secondary | ICD-10-CM

## 2022-11-08 DIAGNOSIS — Z923 Personal history of irradiation: Secondary | ICD-10-CM | POA: Insufficient documentation

## 2022-11-08 DIAGNOSIS — Z9221 Personal history of antineoplastic chemotherapy: Secondary | ICD-10-CM | POA: Diagnosis not present

## 2022-11-08 DIAGNOSIS — F419 Anxiety disorder, unspecified: Secondary | ICD-10-CM

## 2022-11-08 DIAGNOSIS — F17210 Nicotine dependence, cigarettes, uncomplicated: Secondary | ICD-10-CM | POA: Insufficient documentation

## 2022-11-08 DIAGNOSIS — Z7901 Long term (current) use of anticoagulants: Secondary | ICD-10-CM | POA: Insufficient documentation

## 2022-11-08 DIAGNOSIS — R531 Weakness: Secondary | ICD-10-CM

## 2022-11-08 DIAGNOSIS — B1920 Unspecified viral hepatitis C without hepatic coma: Secondary | ICD-10-CM | POA: Insufficient documentation

## 2022-11-08 LAB — CMP (CANCER CENTER ONLY)
ALT: 19 U/L (ref 0–44)
AST: 46 U/L — ABNORMAL HIGH (ref 15–41)
Albumin: 3.9 g/dL (ref 3.5–5.0)
Alkaline Phosphatase: 68 U/L (ref 38–126)
Anion gap: 10 (ref 5–15)
BUN: 22 mg/dL — ABNORMAL HIGH (ref 6–20)
CO2: 29 mmol/L (ref 22–32)
Calcium: 9.5 mg/dL (ref 8.9–10.3)
Chloride: 97 mmol/L — ABNORMAL LOW (ref 98–111)
Creatinine: 0.66 mg/dL (ref 0.61–1.24)
GFR, Estimated: >60 mL/min (ref >60)
Glucose, Bld: 130 mg/dL — ABNORMAL HIGH (ref 70–99)
Potassium: 3.2 mmol/L — ABNORMAL LOW (ref 3.5–5.1)
Sodium: 136 mmol/L (ref 135–145)
Total Bilirubin: 0.6 mg/dL (ref 0.3–1.2)
Total Protein: 7.9 g/dL (ref 6.5–8.1)

## 2022-11-08 LAB — CBC WITH DIFFERENTIAL (CANCER CENTER ONLY)
Abs Immature Granulocytes: 0.03 10*3/uL (ref 0.00–0.07)
Basophils Absolute: 0.1 10*3/uL (ref 0.0–0.1)
Basophils Relative: 1 %
Eosinophils Absolute: 0.1 10*3/uL (ref 0.0–0.5)
Eosinophils Relative: 2 %
HCT: 40.8 % (ref 39.0–52.0)
Hemoglobin: 13 g/dL (ref 13.0–17.0)
Immature Granulocytes: 0 %
Lymphocytes Relative: 9 %
Lymphs Abs: 0.7 10*3/uL (ref 0.7–4.0)
MCH: 28.8 pg (ref 26.0–34.0)
MCHC: 31.9 g/dL (ref 30.0–36.0)
MCV: 90.3 fL (ref 80.0–100.0)
Monocytes Absolute: 0.5 10*3/uL (ref 0.1–1.0)
Monocytes Relative: 6 %
Neutro Abs: 6.3 10*3/uL (ref 1.7–7.7)
Neutrophils Relative %: 82 %
Platelet Count: 249 10*3/uL (ref 150–400)
RBC: 4.52 MIL/uL (ref 4.22–5.81)
RDW: 14.6 % (ref 11.5–15.5)
WBC Count: 7.7 10*3/uL (ref 4.0–10.5)
nRBC: 0 % (ref 0.0–0.2)

## 2022-11-08 LAB — MAGNESIUM: Magnesium: 1.9 mg/dL (ref 1.7–2.4)

## 2022-11-08 LAB — TSH: TSH: 6.319 u[IU]/mL — ABNORMAL HIGH (ref 0.350–4.500)

## 2022-11-08 LAB — T4, FREE: Free T4: 0.91 ng/dL (ref 0.61–1.12)

## 2022-11-08 MED ORDER — POTASSIUM CHLORIDE 10 MEQ/100ML IV SOLN
10.0000 meq | INTRAVENOUS | Status: AC
  Administered 2022-11-08 (×2): 10 meq via INTRAVENOUS
  Filled 2022-11-08 (×2): qty 100

## 2022-11-08 MED ORDER — SODIUM CHLORIDE 0.9 % IV SOLN: Freq: Once | INTRAVENOUS | Status: AC

## 2022-11-08 MED ORDER — DIAZEPAM 1 MG/ML PO SOLN: 2.0000 mg | Freq: Three times a day (TID) | ORAL | 0 refills | Status: DC | PRN

## 2022-11-08 NOTE — Progress Notes (Signed)
Palliative Medicine [facility]  [phone] [phone]   Name: David [name] Date: 11/08/2022 MRN: [id number]  DOB: [birth year]  Patient Care Team: Faith Rogue, DO as PCP - General (Internal Medicine) [name], Lise Auer, RN as Oncology Nurse Navigator [name], Skeet Latch, DO as Consulting Physician (Otolaryngology) Lonie Peak, MD as Consulting Physician (Radiation Oncology) Rachel Moulds, MD as Consulting Physician (Hematology and Oncology) Pickenpack-Cousar, Arty Baumgartner, NP as Nurse Practitioner (Nurse Practitioner)   REASON FOR CONSULTATION: David [name] is a 47 y.o. 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 During hospitalization CT of chest showed concerns for residual vs recurrent tumor. Palliative requested to re-engage for ongoing support and goals of care.   SOCIAL HISTORY:     reports that he has been smoking cigarettes. He has never used smokeless tobacco. He reports that he does not currently use alcohol after a past usage of about 12.0 standard drinks of alcohol per week. He reports that he does not currently use drugs after having used the following drugs: IV.  ADVANCE DIRECTIVES:  Patient reports he does not have advanced directives.  Would like to further discuss in the future.  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)    Pneumococcal bacteremia 06/09/2021   Pneumothorax    Left lung spontaneous pneumothorax at age 67 yr    S/P percutaneous endoscopic gastrostomy (PEG) tube placement (HCC) 06/24/2021    PAST SURGICAL HISTORY:  Past Surgical History:  Procedure Laterality Date   chest tubes Left    IR GASTROSTOMY TUBE MOD SED  06/18/2021   LAPAROSCOPIC INSERTION GASTROSTOMY TUBE N/A 06/20/2021   Procedure: LAPAROSCOPIC INSERTION GASTROSTOMY TUBE;  Surgeon: Berna Bue, MD;  Location: [facility] OR;  Service: General;  Laterality: N/A;  DOW PATIENT   TRACHEOSTOMY TUBE PLACEMENT N/A 06/09/2021   Procedure: AWAKE TRACHEOSTOMY;  Surgeon: Christia Reading, MD;  Location: [facility] OR;  Service: ENT;  Laterality: N/A;      ALLERGIES:  has No Known Allergies.  MEDICATIONS:  Current Outpatient Medications  Medication Sig Dispense Refill   apixaban (ELIQUIS) 5 MG TABS tablet Take 1 tablet (5 mg total) by mouth 2 (two) times daily. 60 tablet 0   diazepam (VALIUM) 1 MG/ML solution Take 2 mLs (2 mg total) by mouth every 8 (eight) hours as needed for muscle spasms (Dysphagia). 100 mL 0   fluconazole (DIFLUCAN) 200 MG tablet Take 200 mg by mouth daily.     hydrOXYzine (ATARAX) 10 MG/5ML syrup Take 5 mLs (10 mg total) by mouth at bedtime as needed for anxiety. 118 mL 0   methadone (DOLOPHINE) 10 MG/ML solution Take 6 mLs (60 mg total)  by mouth daily. (Patient taking differently: Take 120 mg by mouth daily.) 90 mL 0   ondansetron (ZOFRAN) 4 MG tablet Take 1 tablet (4 mg total) by mouth every 8 (eight) hours as needed for nausea or vomiting. 30 tablet 1   Pregabalin 20 MG/ML SOLN Take 40 mg by mouth  3 (three) times daily. 120 mL 1   sertraline (ZOLOFT) 20 MG/ML concentrated solution Mix 1.25 mLs (25 mg total)  with 1/2 cup of water and take by mouth daily. 60 mL 2   No current facility-administered medications for this visit.      Latest Ref Rng & Units 11/08/2022    8:59 AM 10/24/2022    2:51 PM 09/23/2022    4:23 PM  CBC  WBC 4.0 - 10.5 K/uL 7.7  4.2  7.4   Hemoglobin 13.0 - 17.0 g/dL 91.4  78.2  95.6   Hematocrit 39.0 - 52.0 % 40.8  37.9  40.0   Platelets 150 - 400 K/uL 249  231  287        Latest Ref Rng & Units 11/08/2022    8:59 AM 10/24/2022    2:51 PM 09/23/2022    4:22 PM  CMP  Glucose 70 - 99 mg/dL 213  99  086   BUN 6 - 20 mg/dL 22  15  33   Creatinine 0.61 - 1.24 mg/dL 5.78  4.69  6.29   Sodium 135 - 145 mmol/L 136  138  137   Potassium 3.5 - 5.1 mmol/L 3.2  4.6  3.9   Chloride 98 - 111 mmol/L 97  101  96   CO2 22 - 32 mmol/L 29  30  28    Calcium 8.9 - 10.3 mg/dL 9.5  9.5  9.7   Total Protein 6.5 - 8.1 g/dL 7.9  7.7  8.2   Total Bilirubin 0.3 - 1.2 mg/dL 0.6  0.4  0.9   Alkaline Phos 38 - 126 U/L 68   55   AST 15 - 41 U/L 46  32  47   ALT 0 - 44 U/L 19  17  20      VITAL SIGNS: There were no vitals taken for this visit. There were no vitals filed for this visit.     Estimated body mass index is 13.31 kg/m as calculated from the following:   Height as of 10/08/22: 5\' 11"  (1.803 m).   Weight as of 10/24/22: 95 lb 6.4 oz (43.3 kg).  PERFORMANCE STATUS (ECOG) : 1 - Symptomatic but completely ambulatory  Assessment NAD, thin  RRR Normal breathing pattern Alert and oriented x3  IMPRESSION: David [name] presented to clinic today for symptom management follow-up and goals of care discussions. He is ambulatory without assistive devices. His mother is present. David [name] shares he is feeling much better today than previous days. Shares feeling poorly on yesterday with nausea and stomach pains. Also endorses recent challenges with constipation requiring multiple  suppositories. This was successful and he confirms now having normal bowel movements.   Over the past month David [name]'s appetite has been minimal. Mom shares over the past several days he has not eaten much, has been sleeping more, and overall concerned for overall well being. He has also had recent follow-up with Pulmonary and ID.   Suprisingly patient has been more awake and his "normal" self on today. Is eating [employer] and requesting something to drink. We reviewed lab findings today. Patient agreed to receive IVF in office for support. Also administered  IV potassium replacement.   I had open and honest discussions with David [name]. He shares his understanding of decline in health. He states he is hoping he will continue to feel better as today is the best day he has had in some time. We discussed some days may be better than others however the overall concern is his ongoing weight loss, fatigue, and decreased appetite. He contributes some of his symptoms to post-nasal drip which causes him to have decreased appetite. Also states he has not been sleeping well and this is why he continues to nap on and off throughout the day. David [name] does acknowledge his weakness and weight loss.   His current weight is down to 88lbs from 95lbs on 9/26.   His mother expresses her concerns and fears of his well-being. She does not want him to suffer and is open to the care he would need in the home if patient is in agreement.   I empathetically approached discussions regarding hospice support in the home. David [name] verbalized understanding of medical concern however is declining outpatient/in-home services at this time. He is very much familiar with hospice sharing previous experience when they were coming into the home. He is polite and open to discussions. States he knows at some point this will be most beneficial for him but is requesting additional time. David [name] states if he does not improve or worsen over the next 2-4 weeks  he would be more open to hospice support in the home. Also agreed that if things continued prior to 2 week follow-up allowing permission for his mom to give [name] a call and set-up services on his behalf.   Extensive education provided on hospice's goals and philosophy of care. He and Mrs. [name] verbalized understanding. David [name] and his mother are clear in wishes to continue taking things one day at a time while managing symptoms minimizing any suffering. If health continues to decline or no improvement they would then be open to considering in home hospice support.   Patient provided permission to speak with his mother separately. Extensive discussion with Ms. [name] regarding David [name]'s overall conditions, medications, and wishes. She is in support of his wishes and will allow time. She knows to contact office as needed.    PLAN:  Extensive goals of care discussions. Patient and mother are clear in expressed wishes to continue to treat the treatable allowing patient every opportunity to continue to thrive as best possible while managing symptoms aggressively, minimizing suffering.  He is not yet in agreement with outpatient hospice support however is able to verbalize his understanding that he knows that this may be most appropriate in the near future.  Patient request additional time for outcomes with close follow-up.  Mother is aware to contact office as needed. IV fluids while in clinic with IV potassium replacement. Ongoing goals of care discussions. I will plan to see patient back in the clinic in 2 weeks in collaboration with his other oncology appointments.  Mother knows to contact office.    Patient and mother expressed understanding and was in agreement with this plan. He also understands that He can call the clinic at any time with any questions, concerns, or complaints.        Any controlled substances utilized were prescribed in the context of palliative care. PDMP has been reviewed.    Visit  consisted of counseling and education dealing with the complex and emotionally intense issues of symptom management and palliative care in the setting of serious  and potentially life-threatening illness.  David [name], AGPCNP-BC  Palliative Medicine Team/[facility]  *Please note that this is a verbal dictation therefore any spelling or grammatical errors are due to the "[employer]agon [employer] One" system interpretation.

## 2022-11-08 NOTE — Progress Notes (Signed)
Pt come in for IVF per NP orders, see MAR. Tolerated well, d/c ambulatory to lobby in stable condition. No needs at time of d/c.

## 2022-11-08 NOTE — Patient Instructions (Signed)

## 2022-11-13 ENCOUNTER — Ambulatory Visit: Payer: MEDICAID | Admitting: Infectious Diseases

## 2022-11-13 ENCOUNTER — Encounter: Payer: Self-pay | Admitting: Infectious Diseases

## 2022-11-13 VITALS — BP 99/82 | HR 86 | Temp 98.6°F | Ht 71.0 in | Wt 91.0 lb

## 2022-11-13 DIAGNOSIS — K029 Dental caries, unspecified: Secondary | ICD-10-CM

## 2022-11-13 DIAGNOSIS — B182 Chronic viral hepatitis C: Secondary | ICD-10-CM | POA: Diagnosis not present

## 2022-11-13 DIAGNOSIS — I82C11 Acute embolism and thrombosis of right internal jugular vein: Secondary | ICD-10-CM | POA: Diagnosis not present

## 2022-11-13 DIAGNOSIS — B376 Candidal endocarditis: Secondary | ICD-10-CM

## 2022-11-13 DIAGNOSIS — C329 Malignant neoplasm of larynx, unspecified: Secondary | ICD-10-CM

## 2022-11-13 NOTE — Assessment & Plan Note (Signed)
Will repeat his BCx Appears to be stable.

## 2022-11-13 NOTE — Progress Notes (Signed)
Subjective:    Patient ID: David [name], male  DOB: [birth year], 47 y.o.        MRN: [id number]   HPI 47 yo M with hx of SCC of the neck s/p tracheostomy placed 05/2021, hx of candida endocarditis, chronic pain syndrome on methadone. He was seen by ID (Vu) 09-2022 for fungal IE (C albicans) and was continued on his flucon indefinitely (started July 2023). His TTE showed no change in his TV lesion (08-2022- 1.2 x 0.6 cm). He also has Hep C (1.47 million 08-2022).  He is also on eliquis for R jugular vein thrombosis noted after he developed R facial swelling.  Swelling occasionally better, occasionally swollen.  Saw vascular in hospital who felt it was too dangerous for intervention (per mom).   He has had increasing fatigue recently, was seen by palliative care last week. Was given hydration and magnesium. Has gained back some of the wt he had lost.   Needs genotype and fibrosure or elastogram.    Health Maintenance  Topic Date Due  . COVID-19 Vaccine (1) Never done  . Colonoscopy  Never done  . INFLUENZA VACCINE  04/28/2023 (Originally 08/29/2022)  . DTaP/Tdap/Td (2 - Td or Tdap) 08/27/2024  . Hepatitis C Screening  Completed  . HIV Screening  Completed  . HPV VACCINES  Aged Out      Review of Systems  Constitutional:  Positive for weight loss. Negative for chills and fever.  Respiratory:  Negative for cough and shortness of breath.        [name] is clean, no d/c.   Gastrointestinal:  Positive for constipation. Negative for diarrhea.  Genitourinary:  Negative for dysuria.   Please see HPI. All other systems reviewed and negative.     Objective:  Physical Exam Vitals reviewed.  Constitutional:      General: He is not in acute distress.    Appearance: Normal appearance. He is not toxic-appearing.  HENT:     Mouth/Throat:     Pharynx: No oropharyngeal exudate or posterior oropharyngeal erythema.  Eyes:     Extraocular Movements: Extraocular movements intact.      Pupils: Pupils are equal, round, and reactive to light.     Comments: Significant periocular swelling on R. And R face.   Cardiovascular:     Rate and Rhythm: Normal rate and regular rhythm.  Pulmonary:     Effort: Pulmonary effort is normal.     Breath sounds: Normal breath sounds.  Abdominal:     General: Bowel sounds are normal. There is no distension.     Palpations: Abdomen is soft.     Tenderness: There is no abdominal tenderness.  Musculoskeletal:     Right lower leg: No edema.     Left lower leg: No edema.  Neurological:     Mental Status: He is alert.          Assessment & Plan:

## 2022-11-13 NOTE — Assessment & Plan Note (Signed)
Will check his genotype and fibrosure.  I'm not sure if his palliative care status precludes him from treatment.  Will see him back in 2 weeks.

## 2022-11-13 NOTE — Assessment & Plan Note (Signed)
Will get him in with Dr Myra Gianotti for f/u. Some concern about his continued edema.

## 2022-11-13 NOTE — Assessment & Plan Note (Signed)
No change to his sig dental disease.

## 2022-11-13 NOTE — Assessment & Plan Note (Signed)
He has f/u on 10-29 with Onc. At his last laryngoscopy, no evidence of recurrence seen. Per mom.  Encouraged him to improve diet (ensure, choc milk).

## 2022-11-14 ENCOUNTER — Encounter: Payer: Self-pay | Admitting: Infectious Diseases

## 2022-11-14 DIAGNOSIS — Z789 Other specified health status: Secondary | ICD-10-CM | POA: Insufficient documentation

## 2022-11-14 LAB — HEPATITIS A ANTIBODY, TOTAL: hep A Total Ab: POSITIVE — AB

## 2022-11-16 LAB — COMPREHENSIVE METABOLIC PANEL
ALT: 23 [IU]/L (ref 0–44)
AST: 61 [IU]/L — ABNORMAL HIGH (ref 0–40)
Albumin: 3.7 g/dL — ABNORMAL LOW (ref 4.1–5.1)
Alkaline Phosphatase: 81 [IU]/L (ref 44–121)
BUN/Creatinine Ratio: 13 (ref 9–20)
BUN: 11 mg/dL (ref 6–24)
Bilirubin Total: <0.2 mg/dL (ref 0.0–1.2)
CO2: 24 mmol/L (ref 20–29)
Calcium: 9 mg/dL (ref 8.7–10.2)
Chloride: 97 mmol/L (ref 96–106)
Creatinine, Ser: 0.84 mg/dL (ref 0.76–1.27)
Globulin, Total: 3.4 g/dL (ref 1.5–4.5)
Glucose: 49 mg/dL — ABNORMAL LOW (ref 70–99)
Potassium: 3.7 mmol/L (ref 3.5–5.2)
Sodium: 138 mmol/L (ref 134–144)
Total Protein: 7.1 g/dL (ref 6.0–8.5)
eGFR: 108 mL/min/{1.73_m2} (ref >59)

## 2022-11-16 LAB — CBC
Hematocrit: 35.6 % — ABNORMAL LOW (ref 37.5–51.0)
Hemoglobin: 11.4 g/dL — ABNORMAL LOW (ref 13.0–17.7)
MCH: 29.3 pg (ref 26.6–33.0)
MCHC: 32 g/dL (ref 31.5–35.7)
MCV: 92 fL (ref 79–97)
Platelets: 160 10*3/uL (ref 150–450)
RBC: 3.89 x10E6/uL — ABNORMAL LOW (ref 4.14–5.80)
RDW: 14 % (ref 11.6–15.4)
WBC: 4 10*3/uL (ref 3.4–10.8)

## 2022-11-16 LAB — HEPATITIS B SURFACE ANTIBODY,QUALITATIVE: Hep B Surface Ab, Qual: NONREACTIVE

## 2022-11-16 LAB — HEPATITIS C GENOTYPE: Hepatitis C Genotype: 3

## 2022-11-16 LAB — HEPATITIS B SURFACE ANTIGEN: Hepatitis B Surface Ag: NEGATIVE

## 2022-11-19 LAB — CULTURE, BLOOD (SINGLE)

## 2022-11-20 ENCOUNTER — Other Ambulatory Visit: Payer: Self-pay

## 2022-11-20 MED ORDER — LIDOCAINE VISCOUS HCL 2 % MT SOLN: 15.0000 mL | OROMUCOSAL | 2 refills | Status: DC | PRN

## 2022-11-21 ENCOUNTER — Ambulatory Visit (HOSPITAL_COMMUNITY)
Admission: RE | Admit: 2022-11-21 | Discharge: 2022-11-21 | Disposition: A | Discharge status: Home / Self Care | Payer: MEDICAID | Source: Ambulatory Visit | Attending: Acute Care | Admitting: Acute Care

## 2022-11-21 DIAGNOSIS — J386 Stenosis of larynx: Secondary | ICD-10-CM | POA: Diagnosis present

## 2022-11-21 DIAGNOSIS — M436 Torticollis: Secondary | ICD-10-CM | POA: Diagnosis present

## 2022-11-21 DIAGNOSIS — C329 Malignant neoplasm of larynx, unspecified: Secondary | ICD-10-CM | POA: Diagnosis present

## 2022-11-21 DIAGNOSIS — E43 Unspecified severe protein-calorie malnutrition: Secondary | ICD-10-CM | POA: Diagnosis present

## 2022-11-21 DIAGNOSIS — Z930 Tracheostomy status: Secondary | ICD-10-CM

## 2022-11-21 NOTE — Progress Notes (Signed)
Reason for visit Trach care  Consulting MD Basilio Cairo  HPI 47 year old male w/ stage IVB Laryngeal cancer.He is s/p chemo and XRT. Has had slow but steady overall decline over the summer. Specifically significant adult FTT. He is hospice appropriate in my opinion. Presents today for planned trach care   Review of Systems  Constitutional:  Positive for malaise/fatigue and weight loss.  HENT: Negative.    Eyes: Negative.   Respiratory:  Positive for shortness of breath.   Cardiovascular: Negative.   Gastrointestinal: Negative.   Genitourinary: Negative.   Musculoskeletal:  Positive for neck pain.  Skin: Negative.     Exam   General 47 year old male well known to me. Presents for planned trach change today  HENT neck still severely contracted with ear almost touching the right shoulder. Recently started on valium apparently that has helped some trach is mid line. Stoma unremarkable. Phonation audible but weak w/PMV Pulm clear Card rrr Abd soft Ext warm  Neuro intactfg   Procedure: the #4 trach was removed. Stoma was assessed and is mid-line and unremarkable . A new 4 trach was placed over obturator. Placement was confirmed via ETCO2. Tolerated well. No sig change    Active Problems:   Tracheostomy status (HCC)   Laryngeal cancer (HCC)   Protein-calorie malnutrition, severe (HCC)   Subglottic stenosis   Torticollis  Tracheostomy status (HCC) Overview: Brand: shiley Size: 4 cuffless  Last change in clinic: 10/24 prior was  9/25   Discussion Trach is unremarkable. Still has sig air trapping w/ capping valve. He is not a candidate for decannulation   Plan ROV 4 weeks Cont PMV as tolerated Added HME for pt. He cont to have sig thick mucous plugs from time to time      My time 15 min   Simonne Martinet ACNP-BC [facility] Pulmonary/Critical Care Pager # [phone] OR # [phone] if no answer

## 2022-11-21 NOTE — Progress Notes (Signed)
Tracheostomy Procedure Note  David [name] [id number] 23-Apr-1975  Pre Procedure Tracheostomy Information  Trach Brand: Shiley Size:  4 Style: Uncuffed Secured by: Velcro   Procedure: Trach changed and clean.     Post Procedure Tracheostomy Information  Trach Brand: Shiley Size:  4 Style: Uncuffed Secured by: Velcro   Post Procedure Evaluation:  ETCO2 positive color change from yellow to purple : Yes.   Vital signs: vitals are stable. Patients current condition: stable Complications: No apparent complications Trach site exam: clean, dry Wound care done: dry, clean, and 4 x 4 gauze Patient did tolerate procedure well.   Education: None  Prescription needs: None    Additional needs: New PMV given and a few trach HMEs given to trial at home.

## 2022-11-25 NOTE — Progress Notes (Unsigned)
Palliative Medicine [facility]  [phone] [phone]   Name: David [name] Date: 11/25/2022 MRN: [id number]  DOB: 01-20-1976  Patient Care Team: Faith Rogue, DO as PCP - General (Internal Medicine) [name], Lise Auer, RN as Oncology Nurse Navigator [name], Skeet Latch, DO as Consulting Physician (Otolaryngology) Lonie Peak, MD as Consulting Physician (Radiation Oncology) Rachel Moulds, MD as Consulting Physician (Hematology and Oncology) Pickenpack-Cousar, Arty Baumgartner, NP as Nurse Practitioner (Nurse Practitioner)   REASON FOR CONSULTATION: David [name] is a 47 y.o. 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 He was recently hospitalized for several months at [facility] where he received treatment for fungal endocarditis and required emergent tracheostomy and PEG tube. During hospitalization CT of chest showed concerns for residual vs recurrent tumor. Palliative requested to re-engage for ongoing support and goals of care.   SOCIAL HISTORY:     reports that he has been smoking cigarettes. He has never used smokeless tobacco. He reports that he does not currently use alcohol after a past usage of about 12.0 standard drinks of alcohol per week. He reports that he does not currently use drugs after having used the following drugs: IV.  ADVANCE DIRECTIVES:  Patient reports he does not have advanced directives.  Would like to further discuss in the future.  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)    Pneumococcal bacteremia 06/09/2021   Pneumothorax    Left lung spontaneous pneumothorax at age 56 yr    S/P percutaneous endoscopic gastrostomy (PEG) tube placement (HCC) 06/24/2021    PAST SURGICAL HISTORY:  Past Surgical History:  Procedure Laterality Date   chest tubes Left    IR GASTROSTOMY TUBE MOD SED  06/18/2021   LAPAROSCOPIC INSERTION GASTROSTOMY TUBE N/A 06/20/2021   Procedure: LAPAROSCOPIC INSERTION GASTROSTOMY TUBE;  Surgeon: Berna Bue, MD;  Location: [facility] OR;  Service: General;  Laterality: N/A;  DOW PATIENT   TRACHEOSTOMY TUBE PLACEMENT N/A 06/09/2021   Procedure: AWAKE TRACHEOSTOMY;  Surgeon: Christia Reading, MD;  Location: [facility] OR;  Service: ENT;  Laterality: N/A;      ALLERGIES:  has No Known Allergies.  MEDICATIONS:  Current Outpatient Medications  Medication Sig Dispense Refill   diazepam (VALIUM) 1 MG/ML solution Take 2 mLs (2 mg total) by mouth every 8 (eight) hours as needed for muscle spasms (Dysphagia). 100 mL 0   ELIQUIS 5 MG TABS tablet TAKE 1 TABLET BY MOUTH TWICE A DAY 60 tablet 0   fluconazole (DIFLUCAN) 200 MG tablet Take 800 mg by mouth daily.     lidocaine (XYLOCAINE) 2 % solution Use as directed 15 mLs in the mouth or throat every 3 (three) hours as needed for mouth pain. Mix 1part 2% viscous lidocaine, 1part water. Swish and spit 30mL of total diluted mixture up  to eight times a day, prn as needed for soreness 200 mL 2   methadone (DOLOPHINE) 10 MG/ML solution Take 6 mLs (60 mg total) by mouth daily. (Patient taking differently: Take 120 mg by mouth daily.) 90 mL 0   ondansetron (ZOFRAN) 4 MG tablet Take 1 tablet (4  mg total) by mouth every 8 (eight) hours as needed for nausea or vomiting. 30 tablet 1   Pregabalin 20 MG/ML SOLN Take 40 mg by mouth 3 (three) times daily. 120 mL 1   sertraline (ZOLOFT) 20 MG/ML concentrated solution Mix 1.25 mLs (25 mg total)  with 1/2 cup of water and take by mouth daily. 60 mL 2   No current facility-administered medications for this visit.      Latest Ref Rng & Units 11/13/2022    3:40 PM 11/08/2022    8:59 AM 10/24/2022    2:51 PM  CBC  WBC 3.4 - 10.8 x10E3/uL 4.0  7.7  4.2   Hemoglobin 13.0 - 17.7 g/dL 30.8  65.7  84.6   Hematocrit 37.5 - 51.0 % 35.6  40.8  37.9   Platelets 150 - 450 x10E3/uL 160  249  231        Latest Ref Rng & Units 11/13/2022    3:40 PM 11/08/2022    8:59 AM 10/24/2022    2:51 PM  CMP  Glucose 70 - 99 mg/dL 49  962  99   BUN 6 - 24 mg/dL 11  22  15    Creatinine 0.76 - 1.27 mg/dL 9.52  8.41  3.24   Sodium 134 - 144 mmol/L 138  136  138   Potassium 3.5 - 5.2 mmol/L 3.7  3.2  4.6   Chloride 96 - 106 mmol/L 97  97  101   CO2 20 - 29 mmol/L 24  29  30    Calcium 8.7 - 10.2 mg/dL 9.0  9.5  9.5   Total Protein 6.0 - 8.5 g/dL 7.1  7.9  7.7   Total Bilirubin 0.0 - 1.2 mg/dL <4.0  0.6  0.4   Alkaline Phos 44 - 121 IU/L 81  68    AST 0 - 40 IU/L 61  46  32   ALT 0 - 44 IU/L 23  19  17      VITAL SIGNS: There were no vitals taken for this visit. There were no vitals filed for this visit.     Estimated body mass index is 12.69 kg/m as calculated from the following:   Height as of 11/13/22: 5\' 11"  (1.803 m).   Weight as of 11/13/22: 91 lb (41.3 kg).  PERFORMANCE STATUS (ECOG) : 1 - Symptomatic but completely ambulatory  Assessment NAD, thin  RRR Normal breathing pattern, trach in place Right neck contracture Alert and oriented x3  IMPRESSION:  [name] presents to clinic for follow-up. His mother is present. He has a history of multiple medical conditions, reports a persistent issue of feeling unsteady and lightheaded, which  has led to a fall approximately three weeks ago. He also reports a significant weight loss, from 102 pounds in August to 91 pounds currently, despite maintaining an appetite and consuming regular meals. He does feel that his appetite is slowly improving.   [name] states he has been experiencing issues with swallowing, with liquid occasionally regurgitating through the right nostril. He also reports a persistent sinus problem in the right nostril, with frequent discharge of mucus and other substances. Is requesting to be re-evaluated by Speech for  recommendations and support.   He has been managing muscle issues in the neck, which had shown some improvement with therapy before it was halted due to a clot. [name] expresses a desire to resume therapy or explore other treatment options once the clot is resolved and he gains approval to do so by his medical team. Acknowledges his awareness that he will be on lifelong anticoagulation.   He is currently on multiple medications, including Lyrica and diazepam, and has recently started using lidocaine. He reports no issues with nausea, vomiting, constipation, or diarrhea, and overall sleep quality is satisfactory. However, he does report ongoing fatigue. Seems to be doing somewhat better compared to weeks prior.   We will continue to closely monitor and support. Patient and mother knows to contact office as needed.   Assessment & Plan  Unintentional Weight Loss Weight has decreased from 102 pounds in August to 91 pounds currently. Patient reports eating meals, but quantity may be insufficient. -Encourage increased caloric intake.  Hypotension Reports of feeling "wobbly" and lightheaded, with one fall approximately three weeks ago. Blood pressure is currently 126/76, improved from previous readings of 96/70. -Monitor blood pressure and symptoms of lightheadedness.  Neck Muscle Issue Reports of muscle issue in the neck, which was improving with therapy  before it was halted due to a clot. -Consider resuming therapy once clot is resolved. -Consider consultation with a nerve, vein, muscle specialist per recommendations of PCP.  -Consider Botox injections as suggested by therapist.  Dysphagia Reports of liquid regurgitation through the nose and chunks of material from the throat. -Request evaluation by speech therapist for swallowing test and further evaluation. -Is actively followed by Pulmonology in setting of tracheostomy  Medication Management Current medications include Lyrica and Diazepam. -Refill Lyrica and Diazepam prescriptions, sending to [facility] pharmacy.  Follow-up Next appointment with Dr. Basilio Cairo and lab work scheduled for November 22nd. -See patient after Dr. Colletta Maryland appointment on November 22nd. -Check in with patient regarding any issues before then.    Any controlled substances utilized were prescribed in the context of palliative care. PDMP has been reviewed.    Visit consisted of counseling and education dealing with the complex and emotionally intense issues of symptom management and palliative care in the setting of serious and potentially life-threatening illness.  Willette Alma, AGPCNP-BC  Palliative Medicine Team/[facility]  *Please note that this is a verbal dictation therefore any spelling or grammatical errors are due to the "Dragon Medical One" system interpretation.

## 2022-11-26 ENCOUNTER — Inpatient Hospital Stay (HOSPITAL_BASED_OUTPATIENT_CLINIC_OR_DEPARTMENT_OTHER): Payer: MEDICAID | Admitting: Nurse Practitioner

## 2022-11-26 ENCOUNTER — Inpatient Hospital Stay (HOSPITAL_BASED_OUTPATIENT_CLINIC_OR_DEPARTMENT_OTHER): Payer: MEDICAID | Admitting: Hematology and Oncology

## 2022-11-26 ENCOUNTER — Encounter: Payer: Self-pay | Admitting: Nurse Practitioner

## 2022-11-26 VITALS — BP 126/76 | HR 84 | Temp 98.5°F | Resp 17 | Wt 91.1 lb

## 2022-11-26 VITALS — BP 110/80 | HR 84 | Temp 98.1°F | Resp 17 | Wt 91.7 lb

## 2022-11-26 DIAGNOSIS — M62838 Other muscle spasm: Secondary | ICD-10-CM

## 2022-11-26 DIAGNOSIS — Z515 Encounter for palliative care: Secondary | ICD-10-CM | POA: Diagnosis not present

## 2022-11-26 DIAGNOSIS — C321 Malignant neoplasm of supraglottis: Secondary | ICD-10-CM | POA: Diagnosis not present

## 2022-11-26 DIAGNOSIS — C329 Malignant neoplasm of larynx, unspecified: Secondary | ICD-10-CM | POA: Diagnosis not present

## 2022-11-26 DIAGNOSIS — G893 Neoplasm related pain (acute) (chronic): Secondary | ICD-10-CM | POA: Diagnosis not present

## 2022-11-26 DIAGNOSIS — F419 Anxiety disorder, unspecified: Secondary | ICD-10-CM

## 2022-11-26 DIAGNOSIS — C328 Malignant neoplasm of overlapping sites of larynx: Secondary | ICD-10-CM | POA: Diagnosis not present

## 2022-11-26 NOTE — Progress Notes (Signed)
[facility] Cancer Follow up:   Faith Rogue, DO [address] [place] [address]   DIAGNOSIS:  Cancer Staging  Laryngeal cancer [facility]) Staging form: Larynx - Supraglottis, AJCC 8th Edition - Clinical stage from 12/01/2020: Stage IVB (cT3, cN3b, cM0) - Signed by Lonie Peak, MD on 12/20/2020 Stage prefix: Initial diagnosis   ASSESSMENT and THERAPY PLAN:   Laryngeal cancer [facility])  This is a 47 year old male patient with squamous cell carcinoma of the larynx post concurrent chemotherapy and radiation with cisplatin 100 mg per metered squared received 2 out of 3 doses who is here for follow up.    His last PET/CT showed disease which was concerning however biopsy was negative at that time.  He was last seen by ENT back in November 2023 and impression was that he had residual lymphadenopathy however the question was if this was treated nodes versus versus residual viable tumor.  Core biopsy in August 2023 was without any viable tumor cells.  He is once again here for follow-up.  Since his last visit here he continues to deal with neck contracture, ongoing fungal endocarditis.  Neck Contracture Severe contracture likely secondary to radiation therapy. Discussed potential benefit of Botox injections to relax the muscle. -Research and coordinate with Dr. Basilio Cairo and Victorino Dike regarding potential providers for Botox injections. -Consider referral to physical therapy for neck exercises.  Endocarditis Ongoing treatment with Fluconazole, with a planned reduction in dose from 800mg  to 200mg  by Dr. Renold Don according to pt. -Continue current treatment plan as directed by infectious disease specialist.  Deep Vein Thrombosis History of blood clot, currently on Eliquis for anticoagulation. -Continue Eliquis as prescribed.  Cancer Surveillance Patient expresses concern about potential cancer recurrence due to coughing up mucus plugs and general fear. -Discuss potential for imaging  with Dr. Basilio Cairo at upcoming appointment. No concern for recurrence based on ROS or PE.  Hepatitis C Patient reports previous self-resolution of Hepatitis C, currently not on treatment. -Discuss potential treatment options with Dr. Renold Don.  Thyroid Function Slightly abnormal thyroid function noted on recent labs, currently not being treated. -Monitor thyroid function, no treatment indicated at this time.  Follow-up in 1 year, unless sooner for Botox injections if indicated.  SUMMARY OF ONCOLOGIC HISTORY: Oncology History  Laryngeal cancer (HCC)  12/01/2020 Cancer Staging   Staging form: Larynx - Supraglottis, AJCC 8th Edition - Clinical stage from 12/01/2020: Stage IVB (cT3, cN3b, cM0) - Signed by Lonie Peak, MD on 12/20/2020 Stage prefix: Initial diagnosis   12/04/2020 Initial Diagnosis   Malignant neoplasm of overlapping sites of larynx [facility])   12/25/2020 - 01/24/2021 Chemotherapy   Patient is on Treatment Plan : HEAD/NECK Cisplatin + XRT q21d      CURRENT THERAPY: s/p chemo and radiation  INTERVAL HISTORY:  Jerramie Bendavid 47 y.o. male returns for evaluation of his head neck cancer.    The patient, with a history of chemoradiation therapy, endocarditis, and a blood clot, presents with a severely contracted neck. The patient believes the contraction may be due to a blood clot or radiation therapy. The patient is currently being treated for endocarditis and reports no changes in his condition. The patient also has a history of Hepatitis C, which he believes his body previously fought off on its own. The patient has been experiencing fatigue and nausea, which he attributes to his Hepatitis C or possibly his medication. The patient also reports coughing up mucus plugs daily, which worries him.The patient expresses fear and concern  about the possibility of his cancer returning.  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   Defective dental restoration 12/31/2021   Chronic periodontitis 12/31/2021   Impacted third molar tooth 12/31/2021   Irritant contact dermatitis associated with digestive stoma 12/25/2021   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   Continuous tobacco abuse 11/21/2021   Dermatitis associated with moisture 10/16/2021   Back pain 08/16/2021   Candidal endocarditis    History of radiation to head and neck region 07/11/2021   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   Dysgeusia 07/11/2021   Pressure injury of skin 06/17/2021   Tracheostomy status (HCC)    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   Heart failure with reduced ejection fraction (HCC) 04/28/2021   Anxiety 04/28/2021   Elevated troponin 04/01/2021   Elevated transaminase level 04/01/2021   Weight loss, unintentional 01/15/2021   Protein-calorie malnutrition, severe (HCC) 01/10/2021   Hypotension 01/08/2021   Chemotherapy induced nausea and vomiting 01/01/2021   Encounter for dental examination 12/13/2020   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   Teeth missing 12/04/2020   Radiation caries 12/04/2020   Retained tooth root 12/04/2020   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  MEDICAL HISTORY: Past Medical History:  Diagnosis Date   Acute respiratory failure with hypoxia (HCC)    AKI (acute kidney injury) (HCC) 01/08/2021   Aspiration pneumonia of right lower lobe (HCC) 06/06/2021   Bacterial endocarditis    Chronic HFrEF (heart failure with reduced ejection fraction) (HCC) 06/09/2021   Community  acquired pneumonia due to Pneumococcus (HCC) 04/01/2021   Evaluation by medical service required 03/19/2022   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HCC)    Leaking PEG tube (HCC) 12/12/2021   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)    Pneumococcal bacteremia 06/09/2021   Pneumothorax    Left lung spontaneous pneumothorax at age 32 yr    S/P percutaneous endoscopic gastrostomy (PEG) tube placement (HCC) 06/24/2021    SURGICAL HISTORY: Past Surgical History:  Procedure Laterality Date   chest tubes Left    IR GASTROSTOMY TUBE MOD SED  06/18/2021   LAPAROSCOPIC INSERTION GASTROSTOMY TUBE N/A 06/20/2021   Procedure: LAPAROSCOPIC INSERTION GASTROSTOMY TUBE;  Surgeon: Berna Bue, MD;  Location: [facility] OR;  Service: General;  Laterality: N/A;  DOW PATIENT   TRACHEOSTOMY TUBE PLACEMENT N/A 06/09/2021   Procedure: AWAKE TRACHEOSTOMY;  Surgeon: Christia Reading, MD;  Location: [facility] OR;  Service: ENT;  Laterality: N/A;    SOCIAL HISTORY: Social History   Socioeconomic History   Marital status: Divorced    Spouse name: Not on file   Number of children: Not on file   Years of education: Not on file   Highest education level: Not on file  Occupational History   Not on file  Tobacco Use   Smoking status: Every Day    Types: Cigarettes   Smokeless tobacco: Never   Tobacco comments:    1 cigs per day  Vaping Use   Vaping status: Never Used  Substance  and Sexual Activity   Alcohol use: Not Currently    Alcohol/week: 12.0 standard drinks of alcohol    Types: 12 Cans of beer per week   Drug use: Not Currently    Types: IV    Comment: 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)   Sexual activity: Not on file  Other Topics Concern   Not on file  Social History Narrative   Not on file   Social Determinants of Health   Financial Resource Strain: Low Risk  (03/19/2022)   Overall Financial Resource Strain (CARDIA)    Difficulty of Paying Living Expenses: Not hard at all  Food  Insecurity: Patient Declined (09/12/2022)   Hunger Vital Sign    Worried About Running Out of Food in the Last Year: Patient declined    Ran Out of Food in the Last Year: Patient declined  Transportation Needs: Patient Declined (09/12/2022)   PRAPARE - Administrator, Civil Service (Medical): Patient declined    Lack of Transportation (Non-Medical): Patient declined  Physical Activity: Insufficiently Active (03/19/2022)   Exercise Vital Sign    Days of Exercise per Week: 3 days    Minutes of Exercise per Session: 20 min  Stress: No Stress Concern Present (03/19/2022)   [employer] of Occupational Health - Occupational Stress Questionnaire    Feeling of Stress : Only a little  Social Connections: Socially Isolated (03/19/2022)   Social Connection and Isolation Panel [NHANES]    Frequency of Communication with Friends and Family: More than three times a week    Frequency of Social Gatherings with Friends and Family: Three times a week    Attends Religious Services: Never    Active Member of Clubs or Organizations: No    Attends Banker Meetings: Never    Marital Status: Divorced  Catering manager Violence: Patient Declined (09/12/2022)   Humiliation, Afraid, Rape, and Kick questionnaire    Fear of Current or Ex-Partner: Patient declined    Emotionally Abused: Patient declined    Physically Abused: Patient declined    Sexually Abused: Patient declined    FAMILY HISTORY: No family history on file.   PHYSICAL EXAMINATION  ECOG PERFORMANCE STATUS: 1 - Symptomatic but completely ambulatory  Vitals:   11/26/22 1528  BP: 110/80  Pulse: 84  Resp: 17  Temp: 98.1 F (36.7 C)  SpO2: 100%   Physical Exam Constitutional:      Appearance: Normal appearance.  Neck:     Comments: Severe contracture of the right neck.  Lymphedema noted.  No definitive palpable lymphadenopathy or other masses. Cardiovascular:     Rate and Rhythm: Normal rate and regular  rhythm.     Pulses: Normal pulses.     Heart sounds: Normal heart sounds.  Pulmonary:     Effort: Pulmonary effort is normal.     Breath sounds: Normal breath sounds.  Musculoskeletal:        General: No swelling.     Cervical back: Rigidity present.     Comments: Range of motion limited in the neck.  Neurological:     Mental Status: He is alert.      LABORATORY DATA:  CBC    Component Value Date/Time   WBC 4.0 11/13/2022 1540   WBC 7.7 11/08/2022 0859   WBC 4.2 10/24/2022 1451   RBC 3.89 (L) 11/13/2022 1540   RBC 4.52 11/08/2022 0859   HGB 11.4 (L) 11/13/2022 1540   HCT 35.6 (L) 11/13/2022 1540  PLT 160 11/13/2022 1540   MCV 92 11/13/2022 1540   MCH 29.3 11/13/2022 1540   MCH 28.8 11/08/2022 0859   MCHC 32.0 11/13/2022 1540   MCHC 31.9 11/08/2022 0859   RDW 14.0 11/13/2022 1540   LYMPHSABS 0.7 11/08/2022 0859   MONOABS 0.5 11/08/2022 0859   EOSABS 0.1 11/08/2022 0859   BASOSABS 0.1 11/08/2022 0859    CMP     Component Value Date/Time   NA 138 11/13/2022 1540   K 3.7 11/13/2022 1540   CL 97 11/13/2022 1540   CO2 24 11/13/2022 1540   GLUCOSE 49 (L) 11/13/2022 1540   GLUCOSE 130 (H) 11/08/2022 0859   BUN 11 11/13/2022 1540   CREATININE 0.84 11/13/2022 1540   CREATININE 0.66 11/08/2022 0859   CREATININE 0.78 10/24/2022 1451   CALCIUM 9.0 11/13/2022 1540   PROT 7.1 11/13/2022 1540   ALBUMIN 3.7 (L) 11/13/2022 1540   AST 61 (H) 11/13/2022 1540   AST 46 (H) 11/08/2022 0859   ALT 23 11/13/2022 1540   ALT 19 11/08/2022 0859   ALKPHOS 81 11/13/2022 1540   BILITOT <0.2 11/13/2022 1540   BILITOT 0.6 11/08/2022 0859   GFRNONAA >60 11/08/2022 0859   GFRAA  01/25/2008 2040    >60        The eGFR has been calculated using the MDRD equation. This calculation has not been validated in all clinical   CBC from October 16 with mild anemia, no evidence of leukopenia or thrombocytopenia, TSH mildly abnormal but free T4 was normal CMP today with normal  creatinine  RADIOGRAPHIC STUDIES:  No results found.  PET CT 9/28  CONCLUSION:   1.  Status post chemoradiation of a right hypopharyngeal/supraglottic squamous cell carcinoma and subsequent tracheostomy with foci of increased FDG uptake visualized along the posterior aspect of the left cricoid cartilage and along the anterior aspect of the right cricoarytenoid cartilage, which appear to correlate to foci of contrast enhancement seen on prior CT neck dated 08/11/2021 and are concerning for recurrent disease. Correlation with direct inspection and tissue sampling is recommended.  2.  Mild diffuse uptake along the tracheostomy site and central neck compartment is favored to represent posttreatment changes.  3.  Focus of mild FDG avidity likely within the upper esophagus could be further evaluated by endoscopy versus attention on follow-up imaging as clinically indicated. No definite associated soft tissue mass on CT is noted in this region.  4.  Non-FDG avid, peripherally calcified cervical lymph nodes are stable in comparison to prior CT neck. No new, enlarging or FDG avid lymph nodes are identified.  5.  Cardiomegaly, with significantly increased size of the right ventricle in comparison to prior. Recommend cardiologic consultation and echocardiogram if not already performed.    6.  Diffuse groundglass opacities and tree-in-bud opacifications with multiple subcentimeter nodules involving the right lung are new in comparison to prior PET CT, however improved relative to most recent CT chest and likely represent evolution of prior pneumonia.  PATHOLOGY: 11/08/2021  Final Pathologic Diagnosis    A. RIGHT PYRIFORM, BIOPSY:               Squamous mucosa with fibrosis.              Negative for high-grade dysplasia and carcinoma     All questions were answered. The patient knows to call the clinic with any problems, questions or concerns. We can certainly see the patient much sooner if  necessary.  Total encounter time: 30  minutes in face-to-face visit time, chart review, lab review, care coordination, and documentation of the encounter*  *Total Encounter Time as defined by the Centers for Medicare and Medicaid Services includes, in addition to the face-to-face time of a patient visit (documented in the note above) non-face-to-face time: obtaining and reviewing outside history, ordering and reviewing medications, tests or procedures, care coordination (communications with other health care professionals or caregivers) and documentation in the medical record.

## 2022-11-27 ENCOUNTER — Encounter: Payer: Self-pay | Admitting: Infectious Diseases

## 2022-11-27 ENCOUNTER — Ambulatory Visit: Payer: MEDICAID | Admitting: Infectious Diseases

## 2022-11-27 VITALS — BP 117/76 | HR 84 | Temp 99.2°F | Wt 90.7 lb

## 2022-11-27 DIAGNOSIS — B1920 Unspecified viral hepatitis C without hepatic coma: Secondary | ICD-10-CM

## 2022-11-27 DIAGNOSIS — I82C11 Acute embolism and thrombosis of right internal jugular vein: Secondary | ICD-10-CM

## 2022-11-27 DIAGNOSIS — C321 Malignant neoplasm of supraglottis: Secondary | ICD-10-CM

## 2022-11-27 DIAGNOSIS — B182 Chronic viral hepatitis C: Secondary | ICD-10-CM

## 2022-11-27 DIAGNOSIS — B376 Candidal endocarditis: Secondary | ICD-10-CM

## 2022-11-27 DIAGNOSIS — M436 Torticollis: Secondary | ICD-10-CM

## 2022-11-27 MED ORDER — GLECAPREVIR-PIBRENTASVIR 100-40 MG PO TABS: 1.0000 | ORAL_TABLET | Freq: Every day | ORAL | Status: DC

## 2022-11-27 MED ORDER — PREGABALIN 20 MG/ML PO SOLN: 40.0000 mg | Freq: Three times a day (TID) | ORAL | 1 refills | Status: DC

## 2022-11-27 MED ORDER — DIAZEPAM 1 MG/ML PO SOLN: 2.0000 mg | Freq: Three times a day (TID) | ORAL | 0 refills | Status: DC | PRN

## 2022-11-27 NOTE — Assessment & Plan Note (Addendum)
He will continue on eliquis indefinitely

## 2022-11-27 NOTE — Progress Notes (Signed)
No problem Trey Paula  I am fine if you see him  He has other problems that is better cared for under medicine. So u think you will be better

## 2022-11-27 NOTE — Progress Notes (Signed)
Subjective:    Patient ID: David [name], male  DOB: [birth year], 47 y.o.        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 He was seen by ID (Vu) 09-2022 for fungal IE (C albicans) and was continued on his flucon (800mg ) indefinitely (started July 2023). His TTE showed no change in his TV lesion (02-9526-: 1.2 x 0.6 cm). He is now down to 400mg  daily of flucon.  He also has Hep C (1.47 million 08-2022, genotype 3).  He is also on eliquis for R jugular vein thrombosis noted after he developed R facial swelling.  Swelling  has been better. 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 Suggested botox to try to relieve.  Saw vascular in hospital who felt it was too dangerous for intervention (per mom).    Feels fatigued which he attributes to his Hep C.   Saw oncology yesterday (10-29-) and is to have repeat neck imaging due to concerns about coughing.  Has ENT/Onc f/u 02-22-23.   Health Maintenance  Topic Date Due  . COVID-19 Vaccine (1) Never done  . Colonoscopy  Never done  . INFLUENZA VACCINE  04/28/2023 (Originally 08/29/2022)  . DTaP/Tdap/Td (2 - Td or Tdap) 08/27/2024  . Hepatitis C Screening  Completed  . HIV Screening  Completed  . HPV VACCINES  Aged Out      Review of Systems  Constitutional:  Negative for chills and fever.  HENT:  Negative for nosebleeds.   Respiratory:  Negative for cough and shortness of breath.   Cardiovascular:  Negative for chest pain.  Gastrointestinal:  Negative for constipation and diarrhea.  Genitourinary:  Positive for dysuria.   Please see HPI. All other systems reviewed and negative.     Objective:  Physical Exam Vitals reviewed.  HENT:     Mouth/Throat:     Mouth: Mucous membranes are moist.     Pharynx: No oropharyngeal exudate.  Eyes:     Extraocular Movements: Extraocular movements intact.     Pupils: Pupils are  equal, round, and reactive to light.  Neck:     Comments: [name] site is clean.  Neck contracted to R Poor dentition Cardiovascular:     Rate and Rhythm: Normal rate and regular rhythm.  Pulmonary:     Effort: Pulmonary effort is normal.     Breath sounds: Normal breath sounds.  Abdominal:     General: Bowel sounds are normal. There is no distension.     Palpations: Abdomen is soft.     Tenderness: There is no abdominal tenderness.  Musculoskeletal:     Right lower leg: No edema.     Left lower leg: No edema.  Neurological:     Mental Status: He is alert.          Assessment & Plan:

## 2022-11-27 NOTE — Assessment & Plan Note (Signed)
Asked them to call ENT/Onc to see about earlier appt and also for potential botox injection.

## 2022-11-27 NOTE — Assessment & Plan Note (Signed)
He's on 400mg  /day of fluconazole Plan is to change to 200mg  qday.  His LFTs were fairly normal at last visit (mild increase in ALT).

## 2022-11-27 NOTE — Addendum Note (Signed)
Addended by: Bufford Spikes on: 11/27/2022 03:54 PM   Modules accepted: Orders

## 2022-11-27 NOTE — Assessment & Plan Note (Addendum)
Pt would like to be treated. Will d/I with pharm regarding treatment and possibility of multiple drug interactions.  He also needs fibrosure for staging.  Will call him back with results.   Also- I spoke with pt that he is seeing Dr Renold Don and myself and I would rather not complicate or duplicate his care.  I asked them to consider choosing between [name]

## 2022-11-27 NOTE — Assessment & Plan Note (Signed)
Hopefully will get in with ENT to alleviate.

## 2022-11-29 ENCOUNTER — Other Ambulatory Visit (INDEPENDENT_AMBULATORY_CARE_PROVIDER_SITE_OTHER): Payer: MEDICAID

## 2022-11-29 ENCOUNTER — Other Ambulatory Visit: Payer: Self-pay

## 2022-11-29 DIAGNOSIS — C321 Malignant neoplasm of supraglottis: Secondary | ICD-10-CM

## 2022-11-29 DIAGNOSIS — B182 Chronic viral hepatitis C: Secondary | ICD-10-CM

## 2022-11-29 DIAGNOSIS — R070 Pain in throat: Secondary | ICD-10-CM

## 2022-11-29 DIAGNOSIS — C329 Malignant neoplasm of larynx, unspecified: Secondary | ICD-10-CM

## 2022-11-29 DIAGNOSIS — R591 Generalized enlarged lymph nodes: Secondary | ICD-10-CM

## 2022-11-29 LAB — COMPREHENSIVE METABOLIC PANEL
ALT: 17 U/L (ref 0–44)
AST: 31 U/L (ref 15–41)
Albumin: 3.1 g/dL — ABNORMAL LOW (ref 3.5–5.0)
Alkaline Phosphatase: 67 U/L (ref 38–126)
Anion gap: 13 (ref 5–15)
BUN: 12 mg/dL (ref 6–20)
CO2: 26 mmol/L (ref 22–32)
Calcium: 9.1 mg/dL (ref 8.9–10.3)
Chloride: 96 mmol/L — ABNORMAL LOW (ref 98–111)
Creatinine, Ser: 0.85 mg/dL (ref 0.61–1.24)
GFR, Estimated: >60 mL/min (ref >60)
Glucose, Bld: 92 mg/dL (ref 70–99)
Potassium: 4 mmol/L (ref 3.5–5.1)
Sodium: 135 mmol/L (ref 135–145)
Total Bilirubin: 0.3 mg/dL (ref 0.3–1.2)
Total Protein: 7.1 g/dL (ref 6.5–8.1)

## 2022-11-29 NOTE — Addendum Note (Signed)
Addended by: Bufford Spikes on: 11/29/2022 10:11 AM   Modules accepted: Orders

## 2022-11-29 NOTE — Progress Notes (Signed)
Pt mother called wanting a swallow evaluation done and not speech therapy, new order placed and pt mother updated. No further needs at this time.

## 2022-12-03 LAB — HCV FIBROSURE
ALPHA 2-MACROGLOBULINS, QN: 387 mg/dL — ABNORMAL HIGH (ref 110–276)
ALT (SGPT) P5P: 17 [IU]/L (ref 0–55)
Apolipoprotein A-1: 100 mg/dL — ABNORMAL LOW (ref 101–178)
Bilirubin, Total: 0.2 mg/dL (ref 0.0–1.2)
Fibrosis Score: 0.45 — ABNORMAL HIGH (ref 0.00–0.21)
GGT: 30 [IU]/L (ref 0–65)
Haptoglobin: 136 mg/dL (ref 23–355)
Necroinflammat Activity Score: 0.08 (ref 0.00–0.17)

## 2022-12-13 ENCOUNTER — Other Ambulatory Visit: Payer: Self-pay | Admitting: Internal Medicine

## 2022-12-13 ENCOUNTER — Other Ambulatory Visit: Payer: Self-pay

## 2022-12-13 ENCOUNTER — Emergency Department (HOSPITAL_COMMUNITY): Payer: MEDICAID

## 2022-12-13 ENCOUNTER — Emergency Department (HOSPITAL_COMMUNITY)
Admission: EM | Admit: 2022-12-13 | Discharge: 2022-12-13 | Disposition: A | Discharge status: Home / Self Care | Payer: MEDICAID | Attending: Emergency Medicine | Admitting: Emergency Medicine

## 2022-12-13 ENCOUNTER — Telehealth: Payer: Self-pay | Admitting: Infectious Diseases

## 2022-12-13 DIAGNOSIS — I509 Heart failure, unspecified: Secondary | ICD-10-CM | POA: Insufficient documentation

## 2022-12-13 DIAGNOSIS — D649 Anemia, unspecified: Secondary | ICD-10-CM | POA: Diagnosis not present

## 2022-12-13 DIAGNOSIS — Z8521 Personal history of malignant neoplasm of larynx: Secondary | ICD-10-CM | POA: Diagnosis not present

## 2022-12-13 DIAGNOSIS — M25552 Pain in left hip: Secondary | ICD-10-CM | POA: Diagnosis present

## 2022-12-13 DIAGNOSIS — W19XXXA Unspecified fall, initial encounter: Secondary | ICD-10-CM | POA: Diagnosis not present

## 2022-12-13 DIAGNOSIS — Z7901 Long term (current) use of anticoagulants: Secondary | ICD-10-CM | POA: Insufficient documentation

## 2022-12-13 DIAGNOSIS — E876 Hypokalemia: Secondary | ICD-10-CM | POA: Diagnosis not present

## 2022-12-13 LAB — CBC WITH DIFFERENTIAL/PLATELET
Abs Immature Granulocytes: 0.02 10*3/uL (ref 0.00–0.07)
Basophils Absolute: 0 10*3/uL (ref 0.0–0.1)
Basophils Relative: 1 %
Eosinophils Absolute: 0 10*3/uL (ref 0.0–0.5)
Eosinophils Relative: 0 %
HCT: 31.2 % — ABNORMAL LOW (ref 39.0–52.0)
Hemoglobin: 9.6 g/dL — ABNORMAL LOW (ref 13.0–17.0)
Immature Granulocytes: 0 %
Lymphocytes Relative: 9 %
Lymphs Abs: 0.5 10*3/uL — ABNORMAL LOW (ref 0.7–4.0)
MCH: 27.6 pg (ref 26.0–34.0)
MCHC: 30.8 g/dL (ref 30.0–36.0)
MCV: 89.7 fL (ref 80.0–100.0)
Monocytes Absolute: 0.4 10*3/uL (ref 0.1–1.0)
Monocytes Relative: 9 %
Neutro Abs: 3.9 10*3/uL (ref 1.7–7.7)
Neutrophils Relative %: 81 %
Platelets: 108 10*3/uL — ABNORMAL LOW (ref 150–400)
RBC: 3.48 MIL/uL — ABNORMAL LOW (ref 4.22–5.81)
RDW: 14.9 % (ref 11.5–15.5)
WBC: 4.8 10*3/uL (ref 4.0–10.5)
nRBC: 0 % (ref 0.0–0.2)

## 2022-12-13 LAB — BASIC METABOLIC PANEL WITH GFR
Anion gap: 6 (ref 5–15)
BUN: 16 mg/dL (ref 6–20)
CO2: 28 mmol/L (ref 22–32)
Calcium: 8.7 mg/dL — ABNORMAL LOW (ref 8.9–10.3)
Chloride: 101 mmol/L (ref 98–111)
Creatinine, Ser: 0.75 mg/dL (ref 0.61–1.24)
GFR, Estimated: >60 mL/min (ref >60)
Glucose, Bld: 127 mg/dL — ABNORMAL HIGH (ref 70–99)
Potassium: 3.4 mmol/L — ABNORMAL LOW (ref 3.5–5.1)
Sodium: 135 mmol/L (ref 135–145)

## 2022-12-13 LAB — APTT: aPTT: 41 s — ABNORMAL HIGH (ref 24–36)

## 2022-12-13 LAB — PROTIME-INR
INR: 1.6 — ABNORMAL HIGH (ref 0.8–1.2)
Prothrombin Time: 19.2 s — ABNORMAL HIGH (ref 11.4–15.2)

## 2022-12-13 MED ORDER — SODIUM CHLORIDE 0.9 % IV BOLUS: 250.0000 mL | Freq: Once | INTRAVENOUS | Status: DC

## 2022-12-13 MED ORDER — ONDANSETRON 4 MG PO TBDP
4.0000 mg | ORAL_TABLET | Freq: Once | ORAL | Status: DC
  Filled 2022-12-13: qty 1

## 2022-12-13 NOTE — ED Notes (Signed)
Pt was stuck. Wasn't successful.

## 2022-12-13 NOTE — Discharge Instructions (Addendum)
Today you were seen for left hip pain after fall.  You may take Tylenol for the pain as needed.  You should schedule follow-up with your primary care provider if pain persist for further evaluation. Thank you for letting us treat you today. After reviewing your labs and imaging, I feel you are safe to go home. Please follow up with your PCP in the next several days and provide them with your records from this visit. Return to the Emergency Room if pain becomes severe or symptoms worsen.

## 2022-12-13 NOTE — Telephone Encounter (Signed)
Tried to call pt to let him know that his Hep C rx was sent in.  No option to leave VM.

## 2022-12-13 NOTE — ED Triage Notes (Signed)
Pt. Stated, I have fallen last night on ice cream  and hurt my left hip and 2 months ago I sipped and hit the left hip and its the same spot. Pt takes  Elaquis for a blood clot in the jugular but its making his rt. Eye swell. He has send a Dr. For it one time per pt.

## 2022-12-13 NOTE — ED Notes (Addendum)
Asked phlebotomy to grab labs on this PT due to being a difficult stick.  Attempted IV one time and unsuccessful.

## 2022-12-13 NOTE — ED Notes (Addendum)
Phlebotomy was unsuccessful in drawing labs.  Asked nurse Tresa Endo to attempt to grab labs.  PA notified face to face.

## 2022-12-13 NOTE — ED Notes (Signed)
Unable to get labs:2x, NB & Inetta Fermo

## 2022-12-13 NOTE — ED Provider Notes (Signed)
[place] EMERGENCY DEPARTMENT AT [facility] Provider Note   CSN: [id number] Arrival date & time: 12/13/22  6213     History  Chief Complaint  Patient presents with   Fall   Hip Pain    David [name] is a 47 y.o. male past medical history significant for laryngeal cancer, thrombosis of right internal jugular vein, torticollis, heart failure, candidal endocarditis presents today with left hip pain.  Patient states that he fell last night and hurt his hip.  Patient states he also fell 2 months ago and hit his hip in the same spot.  Patient notes pain with ambulation and states that it feels like "bone pain".  Patient denies numbness, radiating pain, tingling, or pain anywhere else.   Fall  Hip Pain       Home Medications Prior to Admission medications   Medication Sig Start Date End Date Taking? Authorizing Provider  diazepam (VALIUM) 1 MG/ML solution Take 2 mLs (2 mg total) by mouth every 8 (eight) hours as needed for muscle spasms (Dysphagia). 11/27/22   Pickenpack-Cousar, Arty Baumgartner, NP  ELIQUIS 5 MG TABS tablet TAKE 1 TABLET BY MOUTH TWICE A DAY 11/11/22   Champ Mungo, DO  fluconazole (DIFLUCAN) 200 MG tablet Take 800 mg by mouth daily.    [provider]  Glecaprevir-Pibrentasvir 100-40 MG TABS Take 1 tablet by mouth daily. 11/27/22   Ginnie Smart, MD  lidocaine (XYLOCAINE) 2 % solution Use as directed 15 mLs in the mouth or throat every 3 (three) hours as needed for mouth pain. Mix 1part 2% viscous lidocaine, 1part water. Swish and spit 30mL of total diluted mixture up to eight times a day, prn as needed for soreness 11/20/22   Pickenpack-Cousar, Arty Baumgartner, NP  methadone (DOLOPHINE) 10 MG/ML solution Take 6 mLs (60 mg total) by mouth daily. Patient taking differently: Take 120 mg by mouth daily. 01/08/21   Pickenpack-Cousar, Arty Baumgartner, NP  ondansetron (ZOFRAN) 4 MG tablet Take 1 tablet (4 mg total) by mouth every 8 (eight) hours as needed for  nausea or vomiting. 11/07/22 11/07/23  Simonne Martinet, NP  Pregabalin 20 MG/ML SOLN Take 40 mg by mouth 3 (three) times daily. 11/27/22   Pickenpack-Cousar, Arty Baumgartner, NP  sertraline (ZOLOFT) 20 MG/ML concentrated solution Mix 1.25 mLs (25 mg total)  with 1/2 cup of water and take by mouth daily. 09/06/22   Masters, Katie, DO      Allergies    Patient has no known allergies.    Review of Systems   Review of Systems  Musculoskeletal:  Positive for arthralgias.    Physical Exam Updated Vital Signs BP 138/77   Pulse 67   Temp 98.5 F (36.9 C)   Resp 18   Ht 5\' 11"  (1.803 m)   SpO2 100%   BMI 12.65 kg/m  Physical Exam Vitals and nursing note reviewed.  Constitutional:      General: He is not in acute distress.    Appearance: He is well-developed.  HENT:     Head: Normocephalic and atraumatic.     Right Ear: External ear normal.     Left Ear: External ear normal.     Nose: Nose normal.  Eyes:     Conjunctiva/sclera: Conjunctivae normal.  Neck:     Comments: Tracheostomy tube present Cardiovascular:     Rate and Rhythm: Normal rate and regular rhythm.     Heart sounds: No murmur heard. Pulmonary:     Effort: Pulmonary  effort is normal. No respiratory distress.     Breath sounds: Normal breath sounds.  Abdominal:     Palpations: Abdomen is soft.     Tenderness: There is no abdominal tenderness.  Musculoskeletal:        General: No swelling.     Cervical back: Neck supple.     Comments: Patient has bony tenderness to left mid thigh pain.  Sensation and bilateral pedal pulses intact.  Range of motion decreased due to pain.  Skin:    General: Skin is warm and dry.     Capillary Refill: Capillary refill takes less than 2 seconds.  Neurological:     Mental Status: He is alert.  Psychiatric:        Mood and Affect: Mood normal.     ED Results / Procedures / Treatments   Labs (all labs ordered are listed, but only abnormal results are displayed) Labs Reviewed  CBC  WITH DIFFERENTIAL/PLATELET - Abnormal; Notable for the following components:      Result Value   RBC 3.48 (*)    Hemoglobin 9.6 (*)    HCT 31.2 (*)    Platelets 108 (*)    Lymphs Abs 0.5 (*)    All other components within normal limits  BASIC METABOLIC PANEL - Abnormal; Notable for the following components:   Potassium 3.4 (*)    Glucose, Bld 127 (*)    Calcium 8.7 (*)    All other components within normal limits  APTT - Abnormal; Notable for the following components:   aPTT 41 (*)    All other components within normal limits  PROTIME-INR - Abnormal; Notable for the following components:   Prothrombin Time 19.2 (*)    INR 1.6 (*)    All other components within normal limits    EKG None  Radiology DG Hip Unilat W or Wo Pelvis 2-3 Views Left  Result Date: 12/13/2022 CLINICAL DATA:  Fall.  Left hip pain. EXAM: DG HIP (WITH OR WITHOUT PELVIS) 2-3V LEFT; LEFT FEMUR 2 VIEWS COMPARISON:  None Available. FINDINGS: Pelvis is intact with normal and symmetric sacroiliac joints. No acute fracture or dislocation. No aggressive osseous lesion. Visualized sacral arcuate lines are unremarkable. Unremarkable symphysis pubis. No significant arthritis of left hip or knee joints. No radiopaque foreign bodies. IMPRESSION: *No acute osseous abnormality of the left hip or left femur. *No significant arthritis. Electronically Signed   By: Jules Schick M.D.   On: 12/13/2022 13:48   DG FEMUR MIN 2 VIEWS LEFT  Result Date: 12/13/2022 CLINICAL DATA:  Fall.  Left hip pain. EXAM: DG HIP (WITH OR WITHOUT PELVIS) 2-3V LEFT; LEFT FEMUR 2 VIEWS COMPARISON:  None Available. FINDINGS: Pelvis is intact with normal and symmetric sacroiliac joints. No acute fracture or dislocation. No aggressive osseous lesion. Visualized sacral arcuate lines are unremarkable. Unremarkable symphysis pubis. No significant arthritis of left hip or knee joints. No radiopaque foreign bodies. IMPRESSION: *No acute osseous abnormality of the  left hip or left femur. *No significant arthritis. Electronically Signed   By: Jules Schick M.D.   On: 12/13/2022 13:48    Procedures Procedures    Medications Ordered in ED Medications  ondansetron (ZOFRAN-ODT) disintegrating tablet 4 mg (0 mg Oral Hold 12/13/22 1255)  sodium chloride 0.9 % bolus 250 mL (has no administration in time range)    ED Course/ Medical Decision Making/ A&P Clinical Course as of 12/13/22 1451  Fri Dec 13, 2022  1128 Stable GLF Hurt hip. Esoph cancer. [  CC]    Clinical Course User Index [CC] Glyn Ade, MD                                 Medical Decision Making Amount and/or Complexity of Data Reviewed Labs: ordered. Radiology: ordered.  Risk Prescription drug management.    This patient presents to the ED with chief complaint(s) of left hip pain with pertinent past medical history of laryngeal cancer, back pain, hepatitis C, which further complicates the presenting complaint. The complaint involves an extensive differential diagnosis and also carries with it a high risk of complications and morbidity.    The differential diagnosis includes musculoskeletal pain hip fracture  Additional history obtained: Additional history obtained from family Records reviewed Primary Care Documents  ED Course and Reassessment:   Independent labs interpretation:  The following labs were independently interpreted:  CBC: Anemia 9.6 which is chronic per historical values, low platelets which is not uncommon for historical value BMP: Mild hypokalemia and hypocalcemia Pro time-INR: 19.2 and 1.6 APTT: 41 EKG: Sinus rhythm  Independent visualization of imaging: - I independently visualized the following imaging with scope of interpretation limited to determining acute life threatening conditions related to emergency care:  Left hip x-ray: No acute osseous abnormalities Left femur x-ray: No acute osseous abnormalities  Consultation: - Consulted or  discussed management/test interpretation w/ external professional: None  Consideration for admission or further workup: Considered for admission or further workup however patient's vital signs have remained stable throughout entire stay.  Patient's physical exam, labs, and imaging have all been reassuring.  Patient should follow-up with PCP if pain persist.        Final Clinical Impression(s) / ED Diagnoses Final diagnoses:  Left hip pain    Rx / DC Orders ED Discharge Orders     None         Dolphus Jenny, PA-C 12/13/22 1454    Glyn Ade, MD 12/16/22 1500

## 2022-12-13 NOTE — ED Notes (Signed)
Pt's had pain in his left leg for the last 3 weeks.  Pt denied injury at the time of pain.  Pt had no obvious deformity or bruising. Pt fell yesterday and stated that the pain was worse.  Pt had good sensory and movement in his lower leg.

## 2022-12-13 NOTE — ED Notes (Signed)
Patient transported to X-ray 

## 2022-12-13 NOTE — ED Notes (Signed)
Nurse Tresa Endo unable to draw labs.  IV team consult orders placed.

## 2022-12-16 NOTE — Progress Notes (Signed)
Palliative Medicine [facility]  [phone] [phone]   Name: Pravin Rummel Date: 12/16/2022 MRN: [id number]  DOB: 1975-04-16  Patient Care Team: Faith Rogue, DO as PCP - General (Internal Medicine) [name], Lise Auer, RN as Oncology Nurse Navigator [name], Skeet Latch, DO as Consulting Physician (Otolaryngology) Lonie Peak, MD as Consulting Physician (Radiation Oncology) Rachel Moulds, MD as Consulting Physician (Hematology and Oncology) Pickenpack-Cousar, Arty Baumgartner, NP as Nurse Practitioner (Nurse Practitioner)   REASON FOR CONSULTATION: Jamol Reim is a 47 y.o. 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 He was recently hospitalized for several months at [facility] where he received treatment for fungal endocarditis and required emergent tracheostomy and PEG tube. During hospitalization CT of chest showed concerns for residual vs recurrent tumor. Palliative requested to re-engage for ongoing support and goals of care.   SOCIAL HISTORY:     reports that he has been smoking cigarettes. He has never used smokeless tobacco. He reports that he does not currently use alcohol after a past usage of about 12.0 standard drinks of alcohol per week. He reports that he does not currently use drugs after having used the following drugs: IV.  ADVANCE DIRECTIVES:  Patient reports he does not have advanced directives.  Would like to further discuss in the future.  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)    Pneumococcal bacteremia 06/09/2021   Pneumothorax    Left lung spontaneous pneumothorax at age 46 yr    S/P percutaneous endoscopic gastrostomy (PEG) tube placement (HCC) 06/24/2021    PAST SURGICAL HISTORY:  Past Surgical History:  Procedure Laterality Date   chest tubes Left    IR GASTROSTOMY TUBE MOD SED  06/18/2021   LAPAROSCOPIC INSERTION GASTROSTOMY TUBE N/A 06/20/2021   Procedure: LAPAROSCOPIC INSERTION GASTROSTOMY TUBE;  Surgeon: Berna Bue, MD;  Location: [facility] OR;  Service: General;  Laterality: N/A;  DOW PATIENT   TRACHEOSTOMY TUBE PLACEMENT N/A 06/09/2021   Procedure: AWAKE TRACHEOSTOMY;  Surgeon: Christia Reading, MD;  Location: [facility] OR;  Service: ENT;  Laterality: N/A;      ALLERGIES:  has No Known Allergies.  MEDICATIONS:  Current Outpatient Medications  Medication Sig Dispense Refill   diazepam (VALIUM) 1 MG/ML solution Take 2 mLs (2 mg total) by mouth every 8 (eight) hours as needed for muscle spasms (Dysphagia). 100 mL 0   ELIQUIS 5 MG TABS tablet TAKE 1 TABLET BY MOUTH TWICE A DAY 60 tablet 0   fluconazole (DIFLUCAN) 200 MG tablet Take 800 mg by mouth daily.     Glecaprevir-Pibrentasvir 100-40 MG TABS Take 1 tablet by mouth daily.     lidocaine (XYLOCAINE) 2 % solution Use as directed 15 mLs in the mouth or throat every 3 (three) hours as needed for mouth pain. Mix 1part  2% viscous lidocaine, 1part water. Swish and spit 30mL of total diluted mixture up to eight times a day, prn as needed for soreness 200 mL 2   methadone (DOLOPHINE) 10 MG/ML solution Take 6 mLs (60 mg total) by mouth daily. (Patient taking differently: Take 120 mg by  mouth daily.) 90 mL 0   ondansetron (ZOFRAN) 4 MG tablet Take 1 tablet (4 mg total) by mouth every 8 (eight) hours as needed for nausea or vomiting. 30 tablet 1   Pregabalin 20 MG/ML SOLN Take 40 mg by mouth 3 (three) times daily. 120 mL 1   sertraline (ZOLOFT) 20 MG/ML concentrated solution Mix 1.25 mLs (25 mg total)  with 1/2 cup of water and take by mouth daily. 60 mL 2   No current facility-administered medications for this visit.      Latest Ref Rng & Units 12/13/2022    2:10 PM 11/13/2022    3:40 PM 11/08/2022    8:59 AM  CBC  WBC 4.0 - 10.5 K/uL 4.8  4.0  7.7   Hemoglobin 13.0 - 17.0 g/dL 9.6  66.0  63.0   Hematocrit 39.0 - 52.0 % 31.2  35.6  40.8   Platelets 150 - 400 K/uL 108  160  249        Latest Ref Rng & Units 12/13/2022    2:10 PM 11/29/2022   10:00 AM 11/13/2022    3:40 PM  CMP  Glucose 70 - 99 mg/dL 160  92  49   BUN 6 - 20 mg/dL 16  12  11    Creatinine 0.61 - 1.24 mg/dL 1.09  3.23  5.57   Sodium 135 - 145 mmol/L 135  135  138   Potassium 3.5 - 5.1 mmol/L 3.4  4.0  3.7   Chloride 98 - 111 mmol/L 101  96  97   CO2 22 - 32 mmol/L 28  26  24    Calcium 8.9 - 10.3 mg/dL 8.7  9.1  9.0   Total Protein 6.5 - 8.1 g/dL  7.1  7.1   Total Bilirubin 0.3 - 1.2 mg/dL  0.3  <3.2   Alkaline Phos 38 - 126 U/L  67  81   AST 15 - 41 U/L  31  61   ALT 0 - 44 U/L  17  23     VITAL SIGNS: There were no vitals taken for this visit. There were no vitals filed for this visit.     Estimated body mass index is 12.65 kg/m as calculated from the following:   Height as of 12/13/22: 5\' 11"  (1.803 m).   Weight as of 11/27/22: 90 lb 11.2 oz (41.1 kg).  PERFORMANCE STATUS (ECOG) : 1 - Symptomatic but completely ambulatory  Assessment NAD, thin  RRR Normal breathing pattern, trach in place Right neck contracture, dependent edema Alert and oriented x3  Discussed the use of AI scribe software for clinical note transcription with the patient, who gave verbal consent to proceed.    IMPRESSION:  Mr. Ridder presented to clinic for follow-up. His mother, Yehuda Mao is present. Seen by Dr. Basilio Cairo today.  Continues to have difficulty swallowing and a persistent issue of food and drink 'dripping out.' of his nose. He is currently on a liquid diet and diazepam. He has not yet had a swallowing test, which was previously discussed. We will plan to follow-up as orders were placed for barium swallow. He states he received a call however it was from the wrong department.  The patient also reports a recent fall, resulting in significant left hip pain. He initially managed the pain with crutches and has since been able to walk. However, he fell again in the same spot, exacerbating the pain. X-rays were performed in the emergency room, which did not reveal any fractures or bruising. Slowly improving. He states he needs to get some rest.   [name]'s weight has been steadily decreasing, from 102 pounds in August to 88 pounds currently. He has been able to eat soft foods such as scrambled eggs, milk, pears, spaghetti, soups, and bananas. He has to limit foods himself to soft foods only. Denies any recent choking events.   His symptoms are well managed with pregabalin and valium as prescribed. Taking as directed  The patient has expressed a desire for ongoing palliative care support and in-home assistance, as he lives alone in [place] with his mother currently. He hopes to return to his own apartment at some point. He has Medicaid and is exploring the possibility of CAPS Civil engineer, contracting for Disabled Adults) services. Education provided on the program and guidelines.   [name] shares he has multiple upcoming appointments with Trach clinic, ID, in December and January with [facility] provider for a potential biopsy, follow-up, and to monitor for cancer recurrence. He has expressed a desire to rest and recover before these appointments.  Assessment & Plan  Unintentional Weight  Loss Weight has decreased from 102 pounds in August to 88 pounds currently. Patient reports eating meals, but quantity may be insufficient. -Encourage increased caloric intake.  Difficulty Swallowing Patient is on a liquid/soft diet and has a pending appointment for a swallowing evaluation. No update on the appointment yet. -Follow up on the swallowing test appointment. This was placed previously however patient states he has not been notified.   Fall and Hip Pain Patient fell and experienced hip pain. X-rays were done and showed no fractures. Patient is currently using crutches. -Continue monitoring hip pain and mobility.  Cancer Monitoring Patient has a history of cancer and is scheduled for follow-up on February 18, 2023, with Dr. Gordy Levan to ensure the cancer has not returned. -Continue current cancer monitoring plan as per Oncology/Radiation Oncology team.  Medication Management Patient is on Diazepam, Eliquis, and Pregabalin. No refills needed at this time. -Continue current medications.  Home Care Assistance Patient's caregiver expressed need for assistance at home. Patient has Medicaid which may cover CAPS ([employer] Program for Disabled Adults). -Advise caregiver to contact Medicaid for CAPS services.  Follow-up Plan to touch base in January after patient's appointments. -Call if medication refills are needed or if there are any changes in patient's condition.    Any controlled substances utilized were prescribed in the context of palliative care. PDMP has been reviewed.    Visit consisted of counseling and education dealing with the complex and emotionally intense issues of symptom management and palliative care in the setting of serious and potentially life-threatening illness.  Willette Alma, AGPCNP-BC  Palliative Medicine Team/[facility]  *Please note that this is a verbal dictation therefore any spelling or grammatical errors are due  to the "[employer]agon [employer] One" system interpretation.

## 2022-12-19 ENCOUNTER — Inpatient Hospital Stay (HOSPITAL_COMMUNITY): Admission: RE | Admit: 2022-12-19 | Payer: MEDICAID | Source: Ambulatory Visit

## 2022-12-19 ENCOUNTER — Telehealth: Payer: Self-pay | Admitting: *Deleted

## 2022-12-19 NOTE — Telephone Encounter (Signed)
CALLED PATIENT TO REMIND OF LAB AND FU FOR 12-20-22- LAB @ 9:30 AM AND FU APPT. WITH DR. Basilio Cairo ON 12-20-22 @ 10 AM , LVM FOR A RETURN CALL

## 2022-12-20 ENCOUNTER — Ambulatory Visit
Admission: RE | Admit: 2022-12-20 | Discharge: 2022-12-20 | Disposition: A | Discharge status: Home / Self Care | Payer: MEDICAID | Source: Ambulatory Visit | Attending: Radiation Oncology | Admitting: Radiation Oncology

## 2022-12-20 ENCOUNTER — Encounter: Payer: Self-pay | Admitting: Nurse Practitioner

## 2022-12-20 ENCOUNTER — Other Ambulatory Visit: Payer: Self-pay

## 2022-12-20 ENCOUNTER — Telehealth: Payer: Self-pay | Admitting: Infectious Diseases

## 2022-12-20 ENCOUNTER — Inpatient Hospital Stay: Payer: MEDICAID | Attending: Hematology and Oncology | Admitting: Nurse Practitioner

## 2022-12-20 ENCOUNTER — Ambulatory Visit
Admission: RE | Admit: 2022-12-20 | Discharge: 2022-12-20 | Disposition: A | Discharge status: Home / Self Care | Payer: MEDICAID | Source: Ambulatory Visit | Attending: Hematology and Oncology | Admitting: Hematology and Oncology

## 2022-12-20 VITALS — BP 118/88 | HR 97 | Temp 99.0°F | Resp 19 | Wt 88.0 lb

## 2022-12-20 DIAGNOSIS — R5381 Other malaise: Secondary | ICD-10-CM | POA: Insufficient documentation

## 2022-12-20 DIAGNOSIS — Z923 Personal history of irradiation: Secondary | ICD-10-CM | POA: Diagnosis not present

## 2022-12-20 DIAGNOSIS — F17210 Nicotine dependence, cigarettes, uncomplicated: Secondary | ICD-10-CM | POA: Insufficient documentation

## 2022-12-20 DIAGNOSIS — G893 Neoplasm related pain (acute) (chronic): Secondary | ICD-10-CM | POA: Diagnosis not present

## 2022-12-20 DIAGNOSIS — R220 Localized swelling, mass and lump, head: Secondary | ICD-10-CM | POA: Insufficient documentation

## 2022-12-20 DIAGNOSIS — C321 Malignant neoplasm of supraglottis: Secondary | ICD-10-CM | POA: Insufficient documentation

## 2022-12-20 DIAGNOSIS — C329 Malignant neoplasm of larynx, unspecified: Secondary | ICD-10-CM

## 2022-12-20 DIAGNOSIS — Z7901 Long term (current) use of anticoagulants: Secondary | ICD-10-CM | POA: Insufficient documentation

## 2022-12-20 DIAGNOSIS — Z515 Encounter for palliative care: Secondary | ICD-10-CM | POA: Diagnosis not present

## 2022-12-20 DIAGNOSIS — R630 Anorexia: Secondary | ICD-10-CM | POA: Diagnosis not present

## 2022-12-20 DIAGNOSIS — Z79899 Other long term (current) drug therapy: Secondary | ICD-10-CM | POA: Insufficient documentation

## 2022-12-20 DIAGNOSIS — Z7189 Other specified counseling: Secondary | ICD-10-CM

## 2022-12-20 DIAGNOSIS — R1311 Dysphagia, oral phase: Secondary | ICD-10-CM

## 2022-12-20 DIAGNOSIS — R530 Neoplastic (malignant) related fatigue: Secondary | ICD-10-CM

## 2022-12-20 DIAGNOSIS — M25552 Pain in left hip: Secondary | ICD-10-CM | POA: Insufficient documentation

## 2022-12-20 DIAGNOSIS — I82C11 Acute embolism and thrombosis of right internal jugular vein: Secondary | ICD-10-CM | POA: Diagnosis not present

## 2022-12-20 DIAGNOSIS — C328 Malignant neoplasm of overlapping sites of larynx: Secondary | ICD-10-CM | POA: Diagnosis present

## 2022-12-20 DIAGNOSIS — Z87891 Personal history of nicotine dependence: Secondary | ICD-10-CM | POA: Diagnosis not present

## 2022-12-20 DIAGNOSIS — R634 Abnormal weight loss: Secondary | ICD-10-CM

## 2022-12-20 LAB — TSH: TSH: 6.419 u[IU]/mL — ABNORMAL HIGH (ref 0.350–4.500)

## 2022-12-20 LAB — T4, FREE: Free T4: 0.63 ng/dL (ref 0.61–1.12)

## 2022-12-20 NOTE — Progress Notes (Signed)
David [name] presents today for follow-up after completing radiation to his larynx on 02/09/2021   Pain issues, if any: Denies any mouth or throat pain Using a feeding tube?: N/A Weight changes, if any:  Wt Readings from Last 3 Encounters:  12/20/22 88 lb (39.9 kg)  11/27/22 90 lb 11.2 oz (41.1 kg)  11/26/22 91 lb 11.2 oz (41.6 kg)   Swallowing issues, if any: Yes--struggles with solid food. Diet is mainly soft/pureed. Liquids often leak out of nostrils  Smoking or chewing tobacco? Was smoking pack/day, but reports he currently isn't able to tolerate 1 cigarette a day (reports he is interested in nicotine patches) Using fluoride toothpaste daily? N/A Last ENT visit was on: 08/13/2022 Saw Dr. 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 He underwent [facility]RT at [facility], completed January 2023 Currently with residual lymphadenopathy (treated nodes vs residual viable tumor???). Core biopsy in Aug 2023 showed no viable tumor cells. Larynx / hypopharynx with some enhancement on July CT, DL/biopsy showed no signs of viable tumor. Stable on f/u CT in Feb 2024 There is no evidence of disease on exam today.  He sees Dr Basilio Cairo in November , Dr Al Pimple PRN Return in January , certainly sooner if there are any questions or problems. He is tearful and insistent on getting medication for anxiety. Will check with his pain medicine clinic to see what they recommend.  All his questions were answered.  --Will see again 02/18/2023  Other notable issues, if any: Mother reports he fell recently, went to ED and had scans done to rule out any fractures. Right jugular vein thrombosis noted after patient developed right facial swelling. Scheduled to follow-up with palliative care later this morning

## 2022-12-20 NOTE — Progress Notes (Signed)
Radiation Oncology         [phone] ________________________________  Name: Samba Madeira MRN: [id number]  Date: 12/20/2022  DOB: 12-29-1975  Follow-Up Visit Note  CC: Faith Rogue, DO  [name], [name], DO  Diagnosis and Prior Radiotherapy:   C32.1     ICD-10-CM   1. Malignant neoplasm of supraglottis (HCC)  C32.1       Cancer Staging  Laryngeal cancer [facility]) Staging form: Larynx - Supraglottis, AJCC 8th Edition - Clinical stage from 12/01/2020: Stage IVB (cT3, cN3b, cM0) - Signed by Lonie Peak, MD on 12/20/2020 Stage prefix: Initial diagnosis   CHIEF COMPLAINT:  Here for follow-up and surveillance of laryngeal cancer  Narrative:   Mr Esquibel is here for a follow -up appointment today with Dr. Basilio Cairo.Completed radiation to the larynx on 02-09-2021.    Mr. Rosal presents today for follow-up after completing radiation to his larynx on 02/09/2021   Pain issues, if any: Denies any mouth or throat pain Using a feeding tube?: N/A Weight changes, if any:  Wt Readings from Last 3 Encounters:  12/20/22 88 lb (39.9 kg)  11/27/22 90 lb 11.2 oz (41.1 kg)  11/26/22 91 lb 11.2 oz (41.6 kg)   Swallowing issues, if any: Yes--struggles with solid food. Diet is mainly soft/pureed. Liquids often leak out of nostrils  Smoking or chewing tobacco? Was smoking pack/day, but reports he currently isn't able to tolerate 1 cigarette a day   Using fluoride toothpaste daily? N/A Last ENT visit was on: 08/13/2022 Saw Dr. 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 He underwent chemoRT at [facility], completed January 2023 Currently with residual lymphadenopathy (treated nodes vs residual viable tumor???). Core biopsy in Aug 2023 showed no viable tumor cells. Larynx / hypopharynx with some enhancement on July CT, DL/biopsy showed no signs of viable tumor. Stable on f/u  CT in Feb 2024 There is no evidence of disease on exam today.  He sees Dr Basilio Cairo in November , Dr Al Pimple PRN Return in January , certainly sooner if there are any questions or problems. He is tearful and insistent on getting medication for anxiety. Will check with his pain medicine clinic to see what they recommend.  All his questions were answered.  --Will see again 02/18/2023  Other notable issues, if any: Mother reports he fell recently, went to ED and had scans done to rule out any fractures. Right jugular vein thrombosis noted (taking Eliquis) after patient developed right facial swelling. Scheduled to follow-up with palliative care later this morning   ALLERGIES:  has No Known Allergies.  Meds: Current Outpatient Medications  Medication Sig Dispense Refill   diazepam (VALIUM) 1 MG/ML solution Take 2 mLs (2 mg total) by mouth every 8 (eight) hours as needed for muscle spasms (Dysphagia). 100 mL 0   ELIQUIS 5 MG TABS tablet TAKE 1 TABLET BY MOUTH TWICE A DAY 60 tablet 0   fluconazole (DIFLUCAN) 200 MG tablet Take 800 mg by mouth daily.     Glecaprevir-Pibrentasvir 100-40 MG TABS Take 1 tablet by mouth daily.     lidocaine (XYLOCAINE) 2 % solution Use as directed 15 mLs in the mouth or throat every 3 (three) hours as needed for mouth pain. Mix 1part 2% viscous lidocaine, 1part water. Swish and spit 30mL of total diluted mixture up to eight times a day, prn as needed for soreness 200 mL 2   methadone (DOLOPHINE) 10 MG/ML solution  Take 6 mLs (60 mg total) by mouth daily. (Patient taking differently: Take 120 mg by mouth daily.) 90 mL 0   ondansetron (ZOFRAN) 4 MG tablet Take 1 tablet (4 mg total) by mouth every 8 (eight) hours as needed for nausea or vomiting. 30 tablet 1   Pregabalin 20 MG/ML SOLN Take 40 mg by mouth 3 (three) times daily. 120 mL 1   sertraline (ZOLOFT) 20 MG/ML concentrated solution Mix 1.25 mLs (25 mg total)  with 1/2 cup of water and take by mouth daily. 60 mL 2   No  current facility-administered medications for this encounter.    Physical Findings: The patient is in no acute distress. Patient is alert and oriented. Wt Readings from Last 3 Encounters:  12/20/22 88 lb (39.9 kg)  11/27/22 90 lb 11.2 oz (41.1 kg)  11/26/22 91 lb 11.2 oz (41.6 kg)    weight is 88 lb (39.9 kg). His oral temperature is 99 F (37.2 C). His blood pressure is 118/88 and his pulse is 97. His respiration is 19 and oxygen saturation is 100%. .  General: Alert and oriented  HEENT: temporal wasting. Extraocular movements are intact. Oropharynx is notable for no tumor in upper throat.  Trach present.  Brittle teeth, most missing. Swelling in upper right eyelid, no redness Neck:  Neck strength is extremely poor, and head is tilted to right >>45 degrees. He needs to manually lift his head to turn it or straighten it.  No sign of recurrence but examination is limited MSK: Muscle wasting, in WC ECOG = 3  0 - Asymptomatic (Fully active, able to carry on all predisease activities without restriction)  1 - Symptomatic but completely ambulatory (Restricted in physically strenuous activity but ambulatory and able to carry out work of a light or sedentary nature. For example, light housework, office work)  2 - Symptomatic, <50% in bed during the day (Ambulatory and capable of all self care but unable to carry out any work activities. Up and about more than 50% of waking hours)  3 - Symptomatic, >50% in bed, but not bedbound (Capable of only limited self-care, confined to bed or chair 50% or more of waking hours)  4 - Bedbound (Completely disabled. Cannot carry on any self-care. Totally confined to bed or chair)  5 - Death   Santiago Glad MM, Creech RH, Tormey DC, et al. [phone]). "Toxicity and response criteria of the [facility]". Am. Evlyn Clines. Oncol. 5 (6): 649-55  Lab Findings: Lab Results  Component Value Date   WBC 4.8 12/13/2022   HGB 9.6 (L) 12/13/2022   HCT 31.2  (L) 12/13/2022   MCV 89.7 12/13/2022   PLT 108 (L) 12/13/2022    Lab Results  Component Value Date   TSH 6.319 (H) 11/08/2022        Latest Ref Rng & Units 12/13/2022    2:10 PM 11/29/2022   10:00 AM 11/13/2022    3:40 PM  BMP  Glucose 70 - 99 mg/dL 960  92  49   BUN 6 - 20 mg/dL 16  12  11    Creatinine 0.61 - 1.24 mg/dL 4.54  0.98  1.19   BUN/Creat Ratio 9 - 20   13   Sodium 135 - 145 mmol/L 135  135  138   Potassium 3.5 - 5.1 mmol/L 3.4  4.0  3.7   Chloride 98 - 111 mmol/L 101  96  97   CO2 22 - 32 mmol/L 28  26  24   Calcium 8.9 - 10.3 mg/dL 8.7  9.1  9.0     Radiographic Findings: DG Hip Unilat W or Wo Pelvis 2-3 Views Left  Result Date: 12/13/2022 CLINICAL DATA:  Fall.  Left hip pain. EXAM: DG HIP (WITH OR WITHOUT PELVIS) 2-3V LEFT; LEFT FEMUR 2 VIEWS COMPARISON:  None Available. FINDINGS: Pelvis is intact with normal and symmetric sacroiliac joints. No acute fracture or dislocation. No aggressive osseous lesion. Visualized sacral arcuate lines are unremarkable. Unremarkable symphysis pubis. No significant arthritis of left hip or knee joints. No radiopaque foreign bodies. IMPRESSION: *No acute osseous abnormality of the left hip or left femur. *No significant arthritis. Electronically Signed   By: Jules Schick M.D.   On: 12/13/2022 13:48   DG FEMUR MIN 2 VIEWS LEFT  Result Date: 12/13/2022 CLINICAL DATA:  Fall.  Left hip pain. EXAM: DG HIP (WITH OR WITHOUT PELVIS) 2-3V LEFT; LEFT FEMUR 2 VIEWS COMPARISON:  None Available. FINDINGS: Pelvis is intact with normal and symmetric sacroiliac joints. No acute fracture or dislocation. No aggressive osseous lesion. Visualized sacral arcuate lines are unremarkable. Unremarkable symphysis pubis. No significant arthritis of left hip or knee joints. No radiopaque foreign bodies. IMPRESSION: *No acute osseous abnormality of the left hip or left femur. *No significant arthritis. Electronically Signed   By: Jules Schick M.D.   On:  12/13/2022 13:48    Impression/Plan:   1) Head and Neck Cancer Status:  Following closely now with ENT at [facility]. NED with chronic sequelae of cancer and treatments and poor nutrition  He is very debilitated and has multiple comorbidities and will continue to follow closely with palliative care.  He sees palliative care today.  2) Nutritional Status:  Even though he's losing weight, he is adamant he doesn't want another PEG. Lesle Reek and Rosalita Chessman contacted today to see if they can to see/evaluate him before the end of the year to help with nutritional support  Wt Readings from Last 3 Encounters:  12/20/22 88 lb (39.9 kg)  11/27/22 90 lb 11.2 oz (41.1 kg)  11/26/22 91 lb 11.2 oz (41.6 kg)     3) Risk Factors: The patient has been educated about risk factors including alcohol and tobacco abuse; they understand that avoidance of alcohol and tobacco is important to prevent recurrences as well as other cancers. Was smoking pack/day, but reports he currently isn't able to tolerate 1 cigarette a day    4) Swallowing:   He's having swallowing issues (liquids are frequently coming out his nose). He would like another swallow study done and to resume SLP sessions. Baldo Ash and Tammy contacted to arrange  5)  Thyroid function:  normal Free T4 in October - check labs at next appt Lab Results  Component Value Date   TSH 6.319 (H) 11/08/2022    6)  He would like to resume PT given that he has lost most of strength in his neck (and it's perpetually leaning to the right). He hasn't been wearing his neck collar which I strongly encouraged he resume during his visit today - referral back to PT today.  7) He will follow-up with me in 6 months  - sooner if needed.    On date of service, in total, I spent 30 minutes on this encounter. Patient was seen in person. _____________________________________   Lonie Peak, MD

## 2022-12-20 NOTE — Telephone Encounter (Signed)
error 

## 2022-12-23 ENCOUNTER — Other Ambulatory Visit: Payer: Self-pay

## 2022-12-23 ENCOUNTER — Other Ambulatory Visit (HOSPITAL_COMMUNITY): Payer: Self-pay | Admitting: Radiation Oncology

## 2022-12-23 ENCOUNTER — Other Ambulatory Visit (HOSPITAL_COMMUNITY): Payer: Self-pay | Admitting: Student

## 2022-12-23 DIAGNOSIS — C321 Malignant neoplasm of supraglottis: Secondary | ICD-10-CM

## 2022-12-23 DIAGNOSIS — C329 Malignant neoplasm of larynx, unspecified: Secondary | ICD-10-CM

## 2022-12-23 DIAGNOSIS — R1310 Dysphagia, unspecified: Secondary | ICD-10-CM

## 2022-12-24 ENCOUNTER — Other Ambulatory Visit: Payer: Self-pay | Admitting: Internal Medicine

## 2022-12-24 ENCOUNTER — Other Ambulatory Visit: Payer: Self-pay

## 2022-12-24 DIAGNOSIS — C329 Malignant neoplasm of larynx, unspecified: Secondary | ICD-10-CM

## 2022-12-25 ENCOUNTER — Other Ambulatory Visit: Payer: Self-pay

## 2022-12-25 DIAGNOSIS — C329 Malignant neoplasm of larynx, unspecified: Secondary | ICD-10-CM

## 2022-12-30 ENCOUNTER — Telehealth: Payer: Self-pay

## 2022-12-30 NOTE — Telephone Encounter (Signed)
Returned his call. He is complaining that his right ear is drooping down to his right shoulder from radiation. Requesting referral to PT. Forwarded message to Dr. Al Pimple POD.

## 2022-12-31 ENCOUNTER — Ambulatory Visit (HOSPITAL_COMMUNITY)
Admission: RE | Admit: 2022-12-31 | Discharge: 2022-12-31 | Disposition: A | Discharge status: Home / Self Care | Payer: MEDICAID | Source: Ambulatory Visit | Attending: Student | Admitting: Student

## 2022-12-31 DIAGNOSIS — Z515 Encounter for palliative care: Secondary | ICD-10-CM

## 2022-12-31 DIAGNOSIS — E43 Unspecified severe protein-calorie malnutrition: Secondary | ICD-10-CM | POA: Diagnosis present

## 2022-12-31 DIAGNOSIS — R627 Adult failure to thrive: Secondary | ICD-10-CM | POA: Diagnosis not present

## 2022-12-31 DIAGNOSIS — L8990 Pressure ulcer of unspecified site, unspecified stage: Secondary | ICD-10-CM | POA: Insufficient documentation

## 2022-12-31 DIAGNOSIS — T85898A Other specified complication of other internal prosthetic devices, implants and grafts, initial encounter: Secondary | ICD-10-CM | POA: Insufficient documentation

## 2022-12-31 DIAGNOSIS — R1310 Dysphagia, unspecified: Secondary | ICD-10-CM

## 2022-12-31 DIAGNOSIS — M436 Torticollis: Secondary | ICD-10-CM | POA: Diagnosis present

## 2022-12-31 DIAGNOSIS — J386 Stenosis of larynx: Secondary | ICD-10-CM | POA: Diagnosis present

## 2022-12-31 DIAGNOSIS — C329 Malignant neoplasm of larynx, unspecified: Secondary | ICD-10-CM | POA: Diagnosis present

## 2022-12-31 DIAGNOSIS — R634 Abnormal weight loss: Secondary | ICD-10-CM | POA: Diagnosis present

## 2022-12-31 DIAGNOSIS — Z9221 Personal history of antineoplastic chemotherapy: Secondary | ICD-10-CM | POA: Diagnosis not present

## 2022-12-31 DIAGNOSIS — Z930 Tracheostomy status: Secondary | ICD-10-CM

## 2022-12-31 DIAGNOSIS — Z8521 Personal history of malignant neoplasm of larynx: Secondary | ICD-10-CM | POA: Insufficient documentation

## 2022-12-31 DIAGNOSIS — Z430 Encounter for attention to tracheostomy: Secondary | ICD-10-CM | POA: Insufficient documentation

## 2022-12-31 DIAGNOSIS — C321 Malignant neoplasm of supraglottis: Secondary | ICD-10-CM | POA: Diagnosis present

## 2022-12-31 DIAGNOSIS — Y828 Other medical devices associated with adverse incidents: Secondary | ICD-10-CM | POA: Diagnosis not present

## 2022-12-31 NOTE — Progress Notes (Signed)
Reason for visit Trach care  Consulting MD Basilio Cairo  HPI 47 year old male w/ stage IVB Laryngeal cancer.He is s/p chemo and XRT. Has had slow but steady overall decline over the summer. Specifically significant adult FTT w/ECOG 3. He remains hospice appropriate in my opinion and since our last visit I have reviewed notes from rad-onc and palliative medicine. Worsening dysphagia w/ evidence of penetration (but [name] downplays this).  recent fall. Also asking to resume PT for his neck contraction.  Presents today for planned trach care   Review of Systems  Constitutional:  Positive for weight loss. Negative for fever.       Appetite a little better but at times poor appetite. This seems to fluctuate   HENT: Negative.         Nose running.   Eyes: Negative.   Respiratory:  Positive for hemoptysis and shortness of breath.        Scant hemoptysis. Usually in am when first getting up.  No fever, chills, inc'd SOB is on DOAC At times coughs w/ PO intake again [name] down plays this but mom noted it   Cardiovascular: Negative.   Gastrointestinal: Negative.   Genitourinary: Negative.   Musculoskeletal:  Positive for neck pain.  Skin: Negative.   Neurological: Negative.     Exam  General this is a 47 year old male well known to me. Presents today in no distress and honestly looking better than he had in past few visits.  HENT marked torticollis w/ his right ear essentially touching his shoulder. Has limited ROM of neck and dependent lymphedema more on right face. Has mild stage I pressure/breakdown to left of trach stoma Pulm some scattered rhonchi.  Card rrr Abd soft Ext warm  Neuro baseline   Procedure: the #4 trach was removed. Stoma was assessed and is mid-line and noted some pressure related skin injury from trach flanged left of stoma. Skin is intact but erythremic. As I do not expect much improvement anatomically re: his neck we decided to change to a  Portex Bivona size 7  Active  Problems:   Tracheostomy status (HCC)   Laryngeal cancer (HCC)   Protein-calorie malnutrition, severe (HCC)   Weight loss, unintentional   Subglottic stenosis   Malignant neoplasm of supraglottis (HCC)   Torticollis   End of life care   Dysphagia, unspecified  Tracheostomy status (HCC) Overview: Brand: shiley Size: 7 Portex Bivona cuffless  Last change in clinic: 12/3 prior was  10/24 Discussion Still has sig air trapping w/ capping valve. He is not a candidate for decannulation.  Given his marked neck contracture developing pressure ulcer on the left of stoma.  Changed to Portex from size 4 Shiley  Plan ROV 4 weeks Cont PMV as tolerated HME as able   FTT (failure to thrive) in adult Overview: ECOG 3 Progressive decline w/ wt down to 88 lbs.  Plan Cont to follow w/ palliative    Dysphagia, unspecified type Overview: W/ notes from rad-onc and palliative describing to me what sounds like penetration  Plan MBS pending He no longer has PEG tube    Torticollis Overview:  Started PT several months ago. D/c'd from therapy back in August. Asking to see if PT can be ordered again. Looks like that order was placed. He really didn't gain much if anything from mobility stand-point. Perhaps got some benefit from pain stand-point?  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

## 2022-12-31 NOTE — Progress Notes (Signed)
Tracheostomy Procedure Note  David [name] [id number] 1975/10/06  Pre Procedure Tracheostomy Information  Trach Brand: Shiley Size:  4.0 [id number] Style: Uncuffed Secured by: Velcro   Procedure: Trach Change and Trach leaning    Post Procedure Tracheostomy Information  Trach Brand: Portex  Bivona  Size:  7.0  [id number] Style: Uncuffed Secured by: Velcro   Post Procedure Evaluation:  ETCO2 positive color change from yellow to purple : Yes.   Vital signs:VSS Patients current condition: stable Complications: No apparent complications Trach site exam: clean, dry Wound care done: 4 x 4 gauze drain Patient did tolerate procedure well.   Education: On new trach style    Prescription needs: none    Additional needs: New PMV given to patient at this visit and an additiona trach new style  # 7.0 ref # [id number]

## 2023-01-01 ENCOUNTER — Inpatient Hospital Stay: Payer: MEDICAID | Attending: Hematology and Oncology | Admitting: Dietician

## 2023-01-01 NOTE — Progress Notes (Signed)
Nutrition Assessment  Called mom on her telephone.  Patient was sleeping.  She reviewed patient's current  PO intake and barriers.   After lengthy discussion with mom, patient woke up and got on the telephone saying he eats fine, doesn't want help with eating or to talk with me about any changes, and used some profanity and stated what he really wants is PT for his neck.   Reason for Assessment: Referral for weight loss.    ASSESSMENT: Patient is 47 year old male with hypopharyngeal / supraglottic squamous cell carcinoma with numerous ipsilateral cervical and retropharyngeal lymph nodes involved. He underwent chemo RT at [facility], completed January 2023. He recently, went to ED and had scans done to rule out any fractures. Right jugular vein thrombosis noted (taking Eliquis) after patient developed right facial swelling.  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 During hospitalization CT of chest showed concerns for residual vs recurrent tumor. He is followed palliative care.  He is currently living with his mom in [place] and traveling back and forth for nutrition in-person visit is a bit of a hardship for mom. 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  Mom reports patient sleeps often and doesn't eat regular meals, only eats when he is hungry.  She denies and nausea or vomiting but patient does have liquids come up through nostrils and leak out of nose.  Speech therapy and swallowing evals have been ordered and he is scheduled with appointments next week.  He can't tolerate meats or very lumpy foods, won't eat chicken salad with any vegetables in it even if cooked soft. She has tried to get him to try yogurts without success.  Usual foods currently: Eggs with cheese and milk.   [employer] Biscuits and gravy (he won't eat in one meal  gets 2 meals out of one portion) Jello, egg salad not on bread, baked spaghetti chopped fine with some ground meats and cottage cheese, chicken & dumplings (ate mostly the dumpling), mashed potatoes with gravy, mac & cheese Loves cold pears.  Trouble with chicken noodle soup didn't want the chicken lumps Fluids: Drinks a lot of Cokes and Gingerale 7 small (8 oz cans) was drinking  Ensure Complete or Plus when cancer center provided it. She's buying Strawberry Rocking Protein but he won't drink it very often.   Medications: No vitamins currently.  Patient is taking meds with pill crusher in puddings and applesauce.  Anthropometrics: weight loss 16# (15%) past 3 months significant for time period  Height: 71" Weight:  12/20/22  88# UBW: has been fluctuating past year 88-104# range BMI: 12.27    NUTRITION DIAGNOSIS: Inadequate PO intake to meet increased nutrient needs, r/t dysphagia continues   MALNUTRITION DIAGNOSIS: Severe malnutrition continues    INTERVENTION:  Relayed that nutrition services are wrap around service provided at no charge and encouraged continued communication if experiencing continued weight loss or any nutritional impact symptoms (NIS).  Discussed ways to add calories/protein to foods (adding cheese, cooking with butter, creamy sauces/gravy)  Encouraged return to Ensure plus BID, will mail coupons Encouraged pureed soups with added mashed potatoes or canned meats  Encouraged small frequent feeds and scheduled meals Encouraged weekly weights Emailed ([name])Nutrition Tip sheet  for  High protein chocolate peanut butter pudding, soft moist proteins contact information provided Mailed Ensure coupons to home address [address], [place], [place] [address]  MONITORING, EVALUATION, GOAL: weight, PO intake, Nutrition Impact Symptoms, labs Goal is weight gain 1-2 pounds per week  Next Visit: Remote after speech evaluation  Gennaro Africa, RDN,  LDN Registered Dietitian, [facility] Part Time Remote (Usual office hours: Tuesday-Thursday) Cell: [phone]

## 2023-01-07 ENCOUNTER — Ambulatory Visit: Payer: MEDICAID | Admitting: Internal Medicine

## 2023-01-07 ENCOUNTER — Other Ambulatory Visit: Payer: Self-pay

## 2023-01-07 VITALS — BP 155/112 | HR 102 | Temp 98.1°F | Ht 71.0 in | Wt 90.8 lb

## 2023-01-07 DIAGNOSIS — B376 Candidal endocarditis: Secondary | ICD-10-CM

## 2023-01-07 DIAGNOSIS — B182 Chronic viral hepatitis C: Secondary | ICD-10-CM

## 2023-01-07 DIAGNOSIS — K089 Disorder of teeth and supporting structures, unspecified: Secondary | ICD-10-CM | POA: Diagnosis not present

## 2023-01-07 DIAGNOSIS — Z930 Tracheostomy status: Secondary | ICD-10-CM

## 2023-01-07 NOTE — Progress Notes (Signed)
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   Defective dental restoration 12/31/2021   Chronic periodontitis 12/31/2021   Impacted third molar tooth 12/31/2021   Irritant contact dermatitis associated with digestive stoma 12/25/2021   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   Continuous tobacco abuse 11/21/2021   Dermatitis associated with moisture 10/16/2021   Back pain 08/16/2021   Candidal endocarditis    History of radiation to head and neck region 07/11/2021   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   Dysgeusia 07/11/2021   Pressure injury of skin 06/17/2021   Tracheostomy status (HCC)    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   Heart failure with reduced ejection fraction (HCC) 04/28/2021   Anxiety 04/28/2021   Elevated troponin 04/01/2021   Elevated transaminase level 04/01/2021   Weight loss, unintentional 01/15/2021   Protein-calorie malnutrition, severe (HCC) 01/10/2021   Hypotension 01/08/2021   Chemotherapy induced nausea and vomiting 01/01/2021   Encounter for dental examination 12/13/2020   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   MRN: [id number]  Chief Complaint  Patient  presents with   Follow-up    HPI:  David [name] is a 47 y.o. 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 Missed dose yesterday as out of med No side effect No fever, chill, rash  He is in in active f/u with [facility] ent for what could possibly be recurrent head/neck cancer. He has been in remission 3-4 months  He is being followed by palliative care team and is asking me if I can do with his chronic back pain/trach site pain  ----------------- 11/07/21 id clinic f/u Patient feeling well He has recent pet scan [facility] and this demonstrate some concerning lesion in his throat and this awaits biopsy. He takes 800 mg fluconazole daily and tolerates it well Again at this time no port/central catheter  No hair loss, n/v, rash, ruq pain, jaundice   02/06/22 id f/u Tolerating fluconazole, down to 400 mg once a day, compliant Missing 200 mg here and there sometimes due to size/nausea Otherwise no issue Wants handicap placcard application filled out   05/09/22 id f/u Doing well on fluconazole 400 mg daily No hair loss No active issue going on outside of "trach being annoying."  09/10/22 id f/u Recent admission right face swelling and right internal jugular thrombosis started on doac Discharged on 800 mg fluconazole instead of 400 mg Tolerating fluconazole No bleeding   10/24/22 id  f/u  Patient continues on doac for right internal jugular acute thrombotic event I continued him on fluconazole 400 mg daily He continues to have on repeat echo 09/24/22 a 1.2 cm stable size tricuspid vegetation Our thought was if tte was completely normal potentially trial off fluconazole and see as he had had it for more than a year  He has no  complaint today   01/07/23 id f/u Patient has been seeing dr Hatcher/internal medicine residency clinic Patient is following up with ent/rad-onc  At this time no further radiation/chemo and no sign of cancer based on last biopsy I had discussed with dr Ninetta Lights to take over care for candida endocarditis which he will do He has no complaint today  He has trach  His feeding tube had fallen off and he is eating by mouth now 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 100 mL 0   ELIQUIS 5 MG TABS tablet TAKE 1 TABLET BY MOUTH TWICE A DAY 60 tablet 0   fluconazole (DIFLUCAN) 200 MG tablet Take 800 mg by mouth daily.     Glecaprevir-Pibrentasvir 100-40 MG TABS Take 1 tablet by mouth daily.     lidocaine (XYLOCAINE) 2 % solution Use as directed 15 mLs in the mouth or throat every 3 (three) hours as needed for mouth pain. Mix 1part 2% viscous lidocaine, 1part water. Swish and spit 30mL of total diluted mixture up to eight times a day, prn as needed for soreness 200 mL 2   midodrine (PROAMATINE) 5 MG tablet TAKE 1 TABLET (5 MG TOTAL) BY MOUTH 3 (THREE) TIMES DAILY WITH MEALS. 270 tablet 2   ondansetron (ZOFRAN) 4 MG tablet Take 1 tablet (4 mg total) by mouth every 8 (eight) hours as needed for nausea or vomiting. 30 tablet 1   Pregabalin 20 MG/ML SOLN Take 40 mg by mouth 3 (three) times daily. 120 mL 1   sertraline (ZOLOFT) 20 MG/ML concentrated solution Mix 1.25 mLs (25 mg total)  with 1/2 cup of water and take by mouth daily. 60 mL 2   methadone (DOLOPHINE) 10 MG/ML solution Take 6 mLs (60 mg total) by mouth daily. (Patient taking differently: Take 120 mg by mouth daily.) 90 mL 0   No  facility-administered medications prior to visit.     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)   Sexual activity:  Not on file  Other Topics Concern   Not on file  Social History Narrative   Not on file   Social Determinants of Health   Financial Resource Strain: Low Risk  (03/19/2022)   Overall Financial Resource Strain (CARDIA)    Difficulty of Paying Living Expenses: Not hard at all  Food Insecurity: Patient Declined (09/12/2022)   Hunger Vital Sign    Worried About Running Out of Food in the Last Year: Patient declined    Ran Out of Food in the Last Year: Patient declined  Transportation Needs: Patient Declined (09/12/2022)   PRAPARE - Administrator, Civil Service (Medical): Patient declined    Lack of Transportation (Non-Medical): Patient declined  Physical Activity: Insufficiently Active (03/19/2022)   Exercise Vital Sign    Days of Exercise per Week: 3 days    Minutes of Exercise per Session: 20 min  Stress: No Stress Concern Present (03/19/2022)   [employer] of Occupational Health - Occupational Stress Questionnaire    Feeling of Stress : Only a little  Social Connections: Socially Isolated (03/19/2022)   Social Connection and Isolation Panel [NHANES]    Frequency of Communication with Friends and Family: More than three times a week     Frequency of Social Gatherings with Friends and Family: Three times a week    Attends Religious Services: Never    Active Member of Clubs or Organizations: No    Attends Banker Meetings: Never    Marital Status: Divorced  Catering manager Violence: Patient Declined (09/12/2022)   Humiliation, Afraid, Rape, and Kick questionnaire    Fear of Current or Ex-Partner: Patient declined    Emotionally Abused: Patient declined    Physically Abused: Patient declined    Sexually Abused: Patient declined      Review of Systems    All other ros negative  Objective:    BP (!) 155/112   Pulse (!) 102   Temp 98.1 F (36.7 C) (Temporal)   Ht 5\' 11"  (1.803 m)   Wt 90 lb 12.8 oz (41.2 kg)   SpO2 100%   BMI 12.66 kg/m  Nursing note and vital signs reviewed.  Physical Exam     Blood pressure (!) 155/112, pulse (!) 102, temperature 98.1 F (36.7 C), temperature source Temporal, height 5\' 11"  (1.803 m), weight 90 lb 12.8 oz (41.2 kg), SpO2 100%.  General/constitutional: no distress, pleasant; right sided head tilt  HEENT: Normocephalic, PER, Conj Clear, EOMI, Oropharynx clear --  chronic right facial fullness nontender; poor dentition  Neck supple -- trach site clean no erythema/discharge CV: rrr no mrg Lungs: clear to auscultation, normal respiratory effort Abd: Soft, Nontender Ext: no edema Skin: No Rash Neuro: nonfocal MSK: right sided flexion torticollis chronic    Labs: Lab Results  Component Value Date   WBC 4.8 12/13/2022   HGB 9.6 (L) 12/13/2022   HCT 31.2 (L) 12/13/2022   MCV 89.7 12/13/2022   PLT 108 (L) 12/13/2022   Last metabolic panel Lab Results  Component Value Date   GLUCOSE 127 (H) 12/13/2022   NA 135 12/13/2022   K 3.4 (L) 12/13/2022   CL 101 12/13/2022   CO2 28 12/13/2022   BUN 16 12/13/2022   CREATININE 0.75 12/13/2022   GFRNONAA >60 12/13/2022   CALCIUM 8.7 (L) 12/13/2022   PHOS 4.6 09/05/2022   PROT 7.1 11/29/2022   ALBUMIN 3.1  (L) 11/29/2022   LABGLOB 3.4 11/13/2022   BILITOT 0.3 11/29/2022  ALKPHOS 67 11/29/2022   AST 31 11/29/2022   ALT 17 11/29/2022   ANIONGAP 6 12/13/2022    Micro:  Serology:  Imaging: Reviewed   09/24/22 tte  1. Left ventricular ejection fraction, by estimation, is 60 to 65%. The  left ventricle has normal function. The left ventricle has no regional  wall motion abnormalities. Left ventricular diastolic parameters are  consistent with Grade II diastolic  dysfunction (pseudonormalization).   2. Right ventricular systolic function is normal. The right ventricular  size is normal. There is normal pulmonary artery systolic pressure.   3. The mitral valve is normal in structure. No evidence of mitral valve  regurgitation. No evidence of mitral stenosis.   4. Mobile density noted in the right atrium that appears to be attached  to the tricuspid valve. Measures 1.2 x 0.6 cm. Image quality is poor, with  most images obtained in the subcostal window. Suggest TEE to better  evaluate. Similar in appearance to  echo performed 07/2021.   5. The aortic valve is normal in structure. Aortic valve regurgitation is  not visualized. No aortic stenosis is present.   6. The inferior vena cava is normal in size with greater than 50%  respiratory variability, suggesting right atrial pressure of 3 mmHg.    Assessment & Plan:   Problem List Items Addressed This Visit       Cardiovascular and Mediastinum   Candidal endocarditis     Other   Tracheostomy status (HCC) (Chronic)   Other Visit Diagnoses     Poor dentition    -  Primary         No orders of the defined types were placed in this encounter.    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 He underwent right piriform biopsy 11/08/2021 at [facility] --> negative for high-grade dysplasia/carcinoma; squamous mucosa with fibrosis  02/2022 last hem/onc visit at [facility] --> family worried about intermittent swelling on the neck --> "may benefit from imaging to clarify. And recommend patient to see medical oncology at [facility] for additional recommendation." "Not a candidate for any form of chemotherapy."  08/03/22 ent flexible laryngoscopy done --> "no evidence of disease on exam". Next f/u 01/2023 or sooner as needed   2 weeks decreased appetite and more sleepy -- discuss biggest elephant in the room is the ongoing question of recurrence of his ENT cancer. There is no sign of relapse fungal infection     With regard to his fungal tv endocarditis: Tte 09/24/22 still showed tv vegetation, but stable size Discuss with him while the thrombus alone doesn't suggestive active infection. Minimal data on discontinuing and observe off abx. So given persistent vegetation, and known high risk relapse, will keep on fluconazole indefinitely. The minimum dose will be fluconazole 400 mg daily   Chronic hep c -- last viral load 09/12/22 1.5 million unit. He asked about treatment 11/2020 hep b sAg and c Igm were negative We discussed that it is not  a good time now to consider hep c treatment given the uncertainty in the head/neck cancer and ddi with eliquis already with fluconazole   Sinusitis sx - have been having chronic nasal drainage. He was given a week amox-clav for his sinus. I am not sure it is warranted or will change anything  Blood pressure chronically on the  loewr side. Heart rate not fast   -not sure why the malaise. Could be viral process. No sign/sx of neck swelling that is worse. No other localizing sx of infection. Doubt breakthrough candida endocarditis -will check labs and bcx -advise to monitor neck sx/swelling (no swelling reported outside of right face swelling from dvt that is decreasing)    01/07/23 id assessment Discussed with patient it's ok to see internal med residency teaching clinic and dr Ninetta Lights for both ID and routine care Discuss need to get dentition taken care off to reduce risk for more endocarditis -- he is on eliquis for recent dvt and timing will need to be arranged in regard to this  His hep c probably is least of the problem. With fluconazole administration and unclear status of ent issue/hx cancer would focus on that first before revisiting hep c treatment F/u ent regarding ongoing monitoring for head neck cancer  Recent labs reviewed and no labs needed today  Advise twice a year to see internal medicine clinic   Follow-up: No follow-ups on file.      Raymondo Band, MD [facility] 01/07/2023, 11:22 AM

## 2023-01-07 NOTE — Patient Instructions (Signed)
You can continue seeing the internal medicine clinic/dr Hatcher twice a year  Make sure though the top issue now is to see ENT and ascertain that the cancer had really gone away  Eventually if nothing is active meaning cancer resolved, candida endocarditis at a good point and your dentition well taken care of, then hep c evaluation and treatment can be considered. Timing will be determined by dr Ninetta Lights   No need to f/u with [name]

## 2023-01-08 ENCOUNTER — Telehealth: Payer: Self-pay | Admitting: Infectious Diseases

## 2023-01-08 NOTE — Telephone Encounter (Signed)
Called pt to see if he had started Hep C rx.

## 2023-01-09 ENCOUNTER — Other Ambulatory Visit: Payer: Self-pay

## 2023-01-09 DIAGNOSIS — Z515 Encounter for palliative care: Secondary | ICD-10-CM

## 2023-01-09 DIAGNOSIS — C329 Malignant neoplasm of larynx, unspecified: Secondary | ICD-10-CM

## 2023-01-09 DIAGNOSIS — M62838 Other muscle spasm: Secondary | ICD-10-CM

## 2023-01-09 DIAGNOSIS — F419 Anxiety disorder, unspecified: Secondary | ICD-10-CM

## 2023-01-09 MED ORDER — DIAZEPAM 1 MG/ML PO SOLN: 2.0000 mg | Freq: Three times a day (TID) | ORAL | 0 refills | Status: DC | PRN

## 2023-01-09 NOTE — Telephone Encounter (Signed)
Pt mother called for script refill, see associated orders.

## 2023-01-10 ENCOUNTER — Ambulatory Visit (HOSPITAL_COMMUNITY)
Admission: RE | Admit: 2023-01-10 | Discharge: 2023-01-10 | Disposition: A | Discharge status: Home / Self Care | Payer: MEDICAID | Source: Ambulatory Visit | Attending: *Deleted | Admitting: *Deleted

## 2023-01-10 DIAGNOSIS — R1310 Dysphagia, unspecified: Secondary | ICD-10-CM | POA: Insufficient documentation

## 2023-01-10 DIAGNOSIS — C329 Malignant neoplasm of larynx, unspecified: Secondary | ICD-10-CM | POA: Diagnosis not present

## 2023-01-10 DIAGNOSIS — C321 Malignant neoplasm of supraglottis: Secondary | ICD-10-CM

## 2023-01-10 NOTE — Progress Notes (Signed)
Modified Barium Swallow Study  Patient Details  Name: David [name] MRN: [id number] Date of Birth: [birth year]  Today's Date: 01/10/2023  Modified Barium Swallow completed.  Full report located under Chart Review in the Imaging Section.  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 He is s/p chemo and XRT. Has had slow but steady overall decline over the summer. Specifically significant adult FTT, torticollis with right ear touching right shoulder, limited ROM of neck and dependent lymphedema right face, mild stage 1 pressure/breakdown to left of trach stoma. He is followed by Anders Simmonds at trach clinic, most recently 12/31/22; trach changed to [place] B[place]ona size 7. Significant air trapping with capping valve, not a candidate for decannulation.   Hx trach 06/09/21, PEG 06/20/21, subsequently "fell out" per his report. Most recent MBS 07/25/21: mild pharyngeal residuals, head turn to right, cough/re-swallow helped with airway protection. Aspiration of thins. Regular/thins recommended. OP SLP from 9-12/23. Pt reports worsening dysphagia with coughing, occasional nasal regurgitation. He is enthusiastic about resuming PT for his neck contraction. His mother accompanied him to exam but was not present in flouroscopy.   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  Quality of imaging was compromised by shoulder/neck positioning and it was difficult to visualize anatomical markers. There were no incidents of nasal regurgitation on today's study (reported to be happening more frequently). There was minimal mobility of the larynx and incomplete laryngeal closure, leading to intermittent aspiration of thin and nectar thick liquids, occurring before and after the swallow response. There was frequent tongue pumping and effort to propel  material from mouth through pharynx, leaving residuals in posterior oral cavity with multiple f/u swallows needed to move bolus into throat. There was minimal pharyngeal residue post-swallow.   Spent time after the study talking with David [name] and his mom about results and worsening dysphagia.  We discussed David [name]' swallowing profile and its characteristic traits of RAD.  We discussed the presence of aspiration, which concerned his mother- however, it occurs in the absence of risk factors that would heighten his risk for pna. Encouraged David [name] to continue walking frequently throughout the day. He should brush his tongue/gums to maintain healthy oral mucosa.  We discussed adding protein supplements and calories as able.  Acknowledged the difficulty of his situation and our inability to do much to improve the swallow at this point. He is planning to see PT soon to help with torticollis.  He is followed by Anders Simmonds at the trach clinic, who has provided great support.  David [name] should continue soft solids, thin liquids as able.  He and his mom verbalized understanding.  Please feel free to contact SLP services should further needs arise. Factors that may increase risk of adverse event in presence of aspiration David [name] & David [name] 2021):  poor general health/compromised immunity  Swallow Evaluation Recommendations Recommendations: PO diet PO Diet Recommendation: Regular;Thin liquids (Level 0) Medication Administration: Whole meds with puree Supervision: Patient able to self-feed Swallowing strategies  : Place PMSV during PO intake Oral care recommendations: Oral care QID (4x/day)    Garyn Arlotta L. Samson Frederic, MA CCC/SLP Clinical Specialist - Acute Care SLP Acute Rehabilitation Services Office number [phone]   Blenda Mounts Laurice 01/10/2023,4:18 PM

## 2023-01-13 ENCOUNTER — Ambulatory Visit: Payer: MEDICAID | Attending: Radiation Oncology

## 2023-01-13 ENCOUNTER — Other Ambulatory Visit: Payer: Self-pay

## 2023-01-13 DIAGNOSIS — R293 Abnormal posture: Secondary | ICD-10-CM | POA: Diagnosis present

## 2023-01-13 DIAGNOSIS — M542 Cervicalgia: Secondary | ICD-10-CM | POA: Insufficient documentation

## 2023-01-13 DIAGNOSIS — C329 Malignant neoplasm of larynx, unspecified: Secondary | ICD-10-CM | POA: Insufficient documentation

## 2023-01-13 DIAGNOSIS — M6281 Muscle weakness (generalized): Secondary | ICD-10-CM | POA: Diagnosis present

## 2023-01-13 NOTE — Therapy (Addendum)
OUTPATIENT PHYSICAL THERAPY CERVICAL EVALUATION   Patient Name: David [name] MRN: [id number] DOB:04/12/1975, 47 y.o., male Today's Date: 01/13/2023  END OF SESSION:  PT End of Session - 01/13/23 0835     Visit Number 1    Date for PT Re-Evaluation 03/10/23    Authorization Type [employer]- requested visits    PT Start Time [phone]    PT Stop Time [phone]    PT Time Calculation (min) 38 min    Activity Tolerance Patient tolerated treatment well    Behavior During Therapy [facility] for tasks assessed/performed              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)    Pneumococcal bacteremia 06/09/2021   Pneumothorax    Left lung spontaneous pneumothorax at age 68 yr    S/P percutaneous endoscopic gastrostomy (PEG) tube placement (HCC) 06/24/2021   Past Surgical History:  Procedure Laterality Date   chest tubes Left    IR GASTROSTOMY TUBE MOD SED  06/18/2021   LAPAROSCOPIC INSERTION GASTROSTOMY TUBE N/A 06/20/2021   Procedure: LAPAROSCOPIC INSERTION GASTROSTOMY TUBE;  Surgeon: Berna Bue, MD;  Location: [facility] OR;  Service: General;  Laterality: N/A;  DOW PATIENT   TRACHEOSTOMY TUBE PLACEMENT N/A 06/09/2021   Procedure: AWAKE TRACHEOSTOMY;  Surgeon: Christia Reading, MD;  Location: [facility] OR;  Service: ENT;  Laterality: N/A;   Patient Active Problem List   Diagnosis Date Noted   FTT (failure to thrive) in adult 12/31/2022   Dysphagia, unspecified 12/31/2022   Pressure ulcer caused  by device 12/31/2022   Hepatitis A immune 11/14/2022   End of life care 10/23/2022   Acute purulent bronchitis 10/23/2022   Hepatitis C 10/09/2022   Left leg pain 10/09/2022   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   History of pancytopenia 09/12/2022   Torticollis 07/04/2022   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   Defective dental restoration 12/31/2021   Chronic periodontitis 12/31/2021   Impacted third molar tooth 12/31/2021   Irritant contact dermatitis associated with digestive stoma 12/25/2021   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   Continuous tobacco abuse 11/21/2021   Dermatitis associated with moisture 10/16/2021   Back pain 08/16/2021   Candidal endocarditis    History of radiation to head and neck region 07/11/2021   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   Dysgeusia 07/11/2021   Pressure injury of skin 06/17/2021   Tracheostomy status (HCC)    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   Heart failure with reduced ejection fraction (HCC) 04/28/2021   Anxiety 04/28/2021   Elevated troponin 04/01/2021   Elevated transaminase level 04/01/2021   Weight loss, unintentional 01/15/2021   Protein-calorie malnutrition, severe (HCC) 01/10/2021   Hypotension 01/08/2021   Chemotherapy induced nausea and vomiting 01/01/2021   Encounter for dental examination 12/13/2020   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   Teeth missing 12/04/2020   Radiation caries 12/04/2020   Retained tooth root 12/04/2020   Chronic apical periodontitis 12/04/2020  Accretions on teeth 12/04/2020    PCP: Adron Bene, MD   REFERRING PROVIDER: Glee Arvin, NP   REFERRING DIAG: Diagnosis Information  Diagnosis  C32.9 (ICD-10-CM) - Laryngeal cancer (HCC)    THERAPY DIAG:  Cervicalgia - Plan: PT plan of care cert/re-cert  Abnormal posture - Plan: PT plan of care cert/re-cert  Muscle weakness (generalized) - Plan:  PT plan of care cert/re-cert  Rationale for Evaluation and Treatment: Rehabilitation  ONSET DATE: 1.5 year history (trach placement)  SUBJECTIVE:                                                                                                                                                                                                         SUBJECTIVE STATEMENT: Pt presents to PT with Rt sided neck tension and shortened tissue secondary to radiation.  Pt has difficulty holding up his head due to pain, postural weakness, and shortened tissue on the Rt due to radiation.  Pt was discharged from PT in August due to clot in Rt jugular vein.  Clot is now resolved.  He reports that he has been stretching and working on posture at home.   Hand dominance: Right  PERTINENT HISTORY:  Tracheostomy, radiation to neck, malignant neoplasm of supraglottis, thrombosis of Rt internal jugular vein, torticollis, heart failure  PAIN:  Are you having pain? Yes: NPRS scale: 1-2/10 Pain location: Rt neck Pain description: stiff, throbbing Aggravating factors: overstretching, trying to stretch  Relieving factors: massaging, medication  PRECAUTIONS: Other: cancer, trach  WEIGHT BEARING RESTRICTIONS: No  FALLS:  Has patient fallen in last 6 months? No  LIVING ENVIRONMENT: Lives with: lives alone Lives in: House/apartment   PLOF: Independent, walking to mailbox  PATIENT GOALS: reduce neck pain, improve mobility   NEXT MD VISIT:  January 2025  OBJECTIVE:   DIAGNOSTIC FINDINGS:  None   PATIENT SURVEYS:  NDI 15/50  COGNITION: Overall cognitive status: Within functional limits for tasks assessed  SENSATION: WFL  POSTURE: rounded shoulders, forward head, increased thoracic kyphosis, and flexed trunk   PALPATION: Significant tension and reduced muscle length in bil neck. Fixed cervical spine segments with minimal mobility allowed.    CERVICAL ROM:   Active ROM A/PROM (deg) 01/13/23   Flexion Rests in 30 degrees flexion  Extension   Right lateral flexion Rests in 30 degrees  Left lateral flexion 10 degrees Rt lateral flexion  Right rotation 50 degrees   Left rotation 25 degrees    (Blank rows = not tested)  UPPER EXTREMITY ROM:  UE A/ROM is WFLs without  pain  UPPER EXTREMITY MMT: 4+/5 bil UE strength, neck strength 4-/5 throughout   TODAY'S TREATMENT:                                                                                                                              DATE: 01/13/23 Review of HEP issued in prior plan of care-see below    Manual therapy: elongation to Rt neck with passive stretch   If treatment provided at initial evaluation, no treatment charged due to lack of authorization.        PATIENT EDUCATION:  Education details: Access Code: [email or web address] Person educated: Patient Education method: Explanation, Demonstration, and Handouts Education comprehension: verbalized understanding and returned demonstration  HOME EXERCISE PROGRAM: Access Code: [email or web address] URL: [email or web address] Date: 05/30/2022 Prepared by: Tresa Endo  Exercises - Seated Scapular Retraction  - 5 x daily - 7 x weekly - 1 sets - 10 reps - 5 hold - Supine Scapular Retraction  - 3 x daily - 7 x weekly - 1 sets - 10 reps - 5 hold - Seated Correct Posture  - 1 x daily - 7 x weekly - 3 sets - 10 reps - Seated Cervical Retraction  - 1 x daily - 7 x weekly - 3 sets - 10 reps - Supine Chin Tuck  - 1 x daily - 7 x weekly - 3 sets - 10 reps - Seated Cervical Sidebending AROM  - 3 x daily - 7 x weekly - 1 sets - 10 reps - 5-10 hold  ASSESSMENT:  CLINICAL IMPRESSION: Patient is a 47 y.o. male who was seen today for physical therapy evaluation and treatment for neck pain and significant postural dysfunction.  Pt has a trach in place and is on pallative care for throat cancer. Pt was treated at this clinic  05/30/22-09/16/22 with improvement in tissue mobility and overall  alignment and posture.  His posture is fixed in 30 degrees flexion and 30 degrees Rt side bending today.   Pt with chronic postural deficits and pain due to effect from cancer treatment.    Pt with significant muscle tension and pain in bil cervical musculature.  Pt with poor conditioning overall secondary to complex medical history. Patient will benefit from skilled PT to address the below impairments and improve overall function.   OBJECTIVE IMPAIRMENTS: decreased activity tolerance, increased muscle spasms, impaired flexibility, postural dysfunction, and pain.   ACTIVITY LIMITATIONS: sitting, standing, and reach over head  PARTICIPATION LIMITATIONS: meal prep, driving, and community activity  PERSONAL FACTORS: 1-2 comorbidities: cancer on pallative care, chronic postural dysfunction  are also affecting patient's functional outcome.   REHAB POTENTIAL: Good  CLINICAL DECISION MAKING: Stable/uncomplicated  EVALUATION COMPLEXITY: Moderate   GOALS: Goals reviewed with patient? Yes  SHORT TERM GOALS: Target date: 02/10/2023      Be independent in initial  HEP Baseline:  Goal status: INITIAL  2.  Verbalize and demonstrate postural corrections to improve tolerance for neutral posture and  reduced neck strain  Baseline:  Goal status: INITIAL   LONG TERM GOALS: Target date: 03/10/2023    Be independent in advanced HEP Baseline:  Goal status: INITIAL  3.  Improve NDI to < or = to 7/50 Baseline: 15/50 Goal status: INITIAL  3.   Perform independent postural correction and hold this x 1-2 minutes  Baseline: needs visual feedback and can hold <30seconds  Goal status: INITIAL   4.   Report > or = to 40% reduction in neck pain due to reduced tension and strain in neck Baseline: constant strain with sitting upright  Goal status: INITIAL    PLAN:  PT FREQUENCY: 1x/week  PT DURATION: 8 weeks  PLANNED INTERVENTIONS: Therapeutic exercises, Therapeutic activity, Neuromuscular  re-education, Balance training, Gait training, Patient/Family education, Self Care, Joint mobilization, Aquatic Therapy, Dry Needling, Electrical stimulation, Spinal mobilization, Cryotherapy, Moist heat, Taping, Traction, Manual therapy, and Re-evaluation  PLAN FOR NEXT SESSION: work on postural reeducation, passive stretching, manual to address muscle tension, DN    Lorrene Reid, PT 01/13/23 8:45 AM    [facility] [address], [address] [place], [place] [address] Phone # [phone] Fax [phone]

## 2023-01-14 ENCOUNTER — Other Ambulatory Visit: Payer: Self-pay

## 2023-01-14 ENCOUNTER — Ambulatory Visit: Payer: MEDICAID | Attending: Radiation Oncology

## 2023-01-14 DIAGNOSIS — C329 Malignant neoplasm of larynx, unspecified: Secondary | ICD-10-CM | POA: Diagnosis not present

## 2023-01-14 DIAGNOSIS — R490 Dysphonia: Secondary | ICD-10-CM | POA: Insufficient documentation

## 2023-01-14 DIAGNOSIS — R1312 Dysphagia, oropharyngeal phase: Secondary | ICD-10-CM | POA: Insufficient documentation

## 2023-01-14 NOTE — Patient Instructions (Addendum)
SWALLOWING EXERCISES Do these at least 5 days/week, then 2 days per week afterwards You can use 1-2 drops of liquid to help you swallow, if your mouth gets dry  Effortful Swallows - Press your tongue against the roof of your mouth for 3 seconds, then swallow as hard as you can - Do at least 20 reps/day, in sets of 5-10  Masako Swallow - swallow with your tongue sticking out - Stick tongue out past your lips and gently bite tongue with your teeth - Swallow, while holding your tongue with your teeth - Do at least 20 reps/day, in sets of 5-10   Mendelsohn Maneuver - "squeeze swallow" exercise - Swallow, and squeeze tight to keep your Adam's Apple up - Hold the squeeze for 5-7 seconds - then relax - Do at least 20 reps/day, in sets of 5-10       6.  "Super Swallow"  - Take a breath and hold it  - Swallow then IMMEDIATELY cough  - Do at least 20 reps/day, in sets of 5-10  ================================================= SWALLOW 3-4 TIMES FOR EACH BITE OR SIP  EAT SOFT FOODS   CLEAN YOUR MOUTH OUT GOOD AFTER EACH TIME YOU EAT SOMETHING

## 2023-01-14 NOTE — Therapy (Signed)
OUTPATIENT SPEECH LANGUAGE PATHOLOGY SWALLOW EVALUATION   [name] Name: David [name] MRN: [id number] DOB:[birth year], 47 y.o., male Today's Date: 01/14/2023  PCP: David Rogue, MD REFERRING PROVIDER: Lonie Peak, MD  END OF SESSION:  End of Session - 01/14/23 1118     Visit Number 1    Number of Visits 9    Date for SLP Re-Evaluation 03/14/23    SLP Start Time 0934    SLP Stop Time  1015    SLP Time Calculation (min) 41 min    Activity Tolerance [name] tolerated treatment well             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)    Pneumococcal bacteremia 06/09/2021   Pneumothorax    Left lung spontaneous pneumothorax at age 35 yr    S/P percutaneous endoscopic gastrostomy (PEG) tube placement (HCC) 06/24/2021   Past Surgical History:  Procedure Laterality Date   chest tubes Left    IR GASTROSTOMY TUBE MOD SED  06/18/2021   LAPAROSCOPIC INSERTION GASTROSTOMY TUBE N/A 06/20/2021   Procedure: LAPAROSCOPIC INSERTION GASTROSTOMY TUBE;  Surgeon: David Bue, MD;  Location: [facility] OR;  Service: General;  Laterality: N/A;  David [name]   TRACHEOSTOMY TUBE PLACEMENT N/A 06/09/2021   Procedure: AWAKE TRACHEOSTOMY;  Surgeon: David Reading, MD;  Location: [facility] OR;  Service: ENT;  Laterality: N/A;   [name] Active Problem List   Diagnosis Date Noted   FTT (failure to thrive) in adult 12/31/2022   Dysphagia, unspecified 12/31/2022   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 12/31/2022    Hepatitis A immune 11/14/2022   End of life care 10/23/2022   Acute purulent bronchitis 10/23/2022   Hepatitis C 10/09/2022   Left leg pain 10/09/2022   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   History of pancytopenia 09/12/2022   Torticollis 07/04/2022   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   Defective dental restoration 12/31/2021   Chronic periodontitis 12/31/2021   Impacted third molar tooth 12/31/2021   Irritant contact dermatitis associated with digestive stoma 12/25/2021   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   Continuous tobacco abuse 11/21/2021   Dermatitis associated with moisture 10/16/2021   Back pain 08/16/2021   Candidal endocarditis    History of radiation to head and neck region 07/11/2021   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   Dysgeusia 07/11/2021   Pressure injury of skin 06/17/2021   Tracheostomy status (HCC)    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   Heart failure with reduced ejection fraction (HCC) 04/28/2021   Anxiety 04/28/2021   Elevated troponin 04/01/2021   Elevated transaminase level 04/01/2021   Weight loss, unintentional 01/15/2021   Protein-calorie malnutrition, severe (HCC) 01/10/2021   Hypotension 01/08/2021   Chemotherapy induced nausea and vomiting 01/01/2021   Encounter for dental examination 12/13/2020   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   Teeth missing 12/04/2020   Radiation caries 12/04/2020   Retained tooth root 12/04/2020   Chronic apical periodontitis 12/04/2020   Accretions  on teeth 12/04/2020    ONSET DATE: 2023   REFERRING DIAG:  C32.9 (ICD-10-CM) - Laryngeal cancer (HCC)    THERAPY DIAG:  Dysphagia, oropharyngeal phase  Hoarseness of voice  Rationale for Evaluation and Treatment: Rehabilitation  SUBJECTIVE:   SUBJECTIVE STATEMENT: "I'm not supposed to wear a cannula but I feel more comfortable with one in so I'm wearing one." Pt accompanied by:  self  PERTINENT HISTORY: Pt well known to this clinician from previous plans of care. HPI: s/p chemo and XRT. Has had slow but steady overall decline over the summer. Specifically significant adult FTT, torticollis with right ear touching right shoulder, limited ROM of neck and dependent lymphedema right face, mild stage 1 pressure/breakdown to left of trach stoma. He is followed by David [name] at trach clinic, most recently 12/31/22; trach changed to [place] Bivona size 7. Significant air trapping with capping valve, not a candidate for decannulation. Hx trach 06/09/21, PEG 06/20/21, subsequently "fell out" per his report. Most recent MBS 07/25/21: mild pharyngeal residuals, head turn to right, cough/re-swallow helped with airway protection. Aspiration of thins. Regular/thins recommended. OP SLP from 9-12/23. Pt reports worsening dysphagia with coughing, occasional nasal regurgitation. MBS completed 01/10/23, results below.   PAIN:  Are you having pain? No  FALLS: Has [name] fallen in last 6 months?  Yes; one  LIVING ENVIRONMENT: Lives with: lives with their family Lives in: House/apartment  PLOF:  Level of assistance: Independent with ADLs, Independent with IADLs Employment: Other: not employed currently  [name] GOALS: get this swallowing better  OBJECTIVE:  Note: Objective measures were completed at Evaluation unless otherwise noted. 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  Quality of imaging was compromised by shoulder/neck positioning and it was difficult to visualize anatomical markers. There were no incidents of nasal regurgitation on today's study (reported to be happening more frequently). There was minimal mobility of the larynx and  incomplete laryngeal closure, leading to intermittent aspiration of thin and nectar thick liquids, occurring before and after the swallow response. There was frequent tongue pumping and effort to propel material from mouth through pharynx, leaving residuals in posterior oral cavity with multiple f/u swallows needed to move bolus into throat. There was minimal pharyngeal residue post-swallow.    Objective recommended compensations:  Recommendations: PO diet PO Diet Recommendation: Regular; Thin liquids (Level 0) Medication Administration: Whole meds with puree Supervision: [name] able to self-feed Swallowing strategies  : Place PMSV during PO intake Oral care recommendations: Oral care QID (4x/day)  COGNITION: Overall cognitive status: Within functional limits for tasks assessed  SUBJECTIVE DYSPHAGIA REPORTS:  Date of onset: 2023 Reported symptoms: coughing with both solids and liquids, xerostomia, regurgitation, hoarseness, and nasal regurgitation  Current diet: Dysphagia 3 (mechanical soft) and thin liquids  Co-morbid voice changes: Yes  FACTORS WHICH MAY INCREASE RISK OF ADVERSE EVENT IN PRESENCE OF ASPIRATION:  General health: poor general health and frail or deconditioned  Risk factors: poor oral health, trach dependent, posturing (torticollis)    ORAL MOTOR EXAMINATION: Overall status: Impaired: Lingual: Bilateral (ROM and Strength) Comments:   CLINICAL SWALLOW ASSESSMENT:   Dentition: edentulous Vocal quality at baseline: hoarse, breathy, and harsh [name] directly observed with POs: Yes: dysphagia 1 (puree) and thin liquids  Feeding: able to feed self Liquids provided by: straw due to torticollis  Oral phase signs and symptoms:  nasal regurgitation (mild) x1/7 with liquids Pharyngeal phase signs and symptoms: audible swallow and delayed cough  [name] REPORTED OUTCOME MEASURES (PROM): EAT-10: to be completed during first session   TODAY'S TREATMENT:                                                                                                                                          DATE:  01/14/23 (eval): see "education" below  [name] EDUCATION: Education details: swallow precautions, swallow HEP, need for thorough oral care x4/day, aspiration PNA s/sx and that pt is at higher risk for this Person educated: [name] Education method: Explanation, Demonstration, Verbal cues, and Handouts Education comprehension: verbalized understanding, returned demonstration, verbal cues required, and needs further education   ASSESSMENT:  CLINICAL IMPRESSION: [name] is a 47 y.o. M who was seen today for assessment of swallow after MBS last week. Pt is currently being seen for torticollis by PT. He was provided a dysphagia HEP in hopes of decr'ing his risk of aspiration by building muscle strength, and he will be educated about aspiration PNA s/sx, maintaining a safe diet, as well as the necessity of optimal oral care. As seen in pt's "s" statement at times pt can not heed medical recommendations so it is critical pt know these aforementioned items. Success of HEP on pt's overall swallow safety at this time is unknown but SLP will educate pt about and cont to encourage pt to complete HEP and follow all swallow safety recommendations.  OBJECTIVE IMPAIRMENTS: include voice disorder and dysphagia. These impairments are limiting [name] from effectively communicating at home and in community and safety when swallowing. Factors affecting potential to achieve goals and functional outcome are cooperation/participation level, medical prognosis, previous level of function, severity of impairments, and financial resources. [name] will benefit from skilled SLP services to address above impairments and improve overall function.  REHAB POTENTIAL: Fair given above factors   GOALS: Goals reviewed with [name]? Yes  SHORT TERM GOALS: Target date: 02/14/23  Pt will complete HEP with  rare min A Baseline: Goal status: INITIAL  2.  Pt will follow swallow strategies from MBS with POs x2 visits Baseline:  Goal status: INITIAL  3.  Pt will tell SLP 3 overt s/sx aspiration PNA with modified independence in 2 sessions Baseline:  Goal status: INITIAL  4.  Pt will tell SLP rationale for thorough oral care in 2 sessions Baseline:  Goal status: INITIAL   LONG TERM GOALS: Target date: 03/14/23  Pt will improve PROM compared to initial administration Baseline:  Goal status: INITIAL  2.  Pt will follow swallow strategies in 1 session after 02/14/23 Baseline:  Goal status: INITIAL  3.  Pt will tell SLP 3 overt s/sx aspiration PNA in 2 sessions Baseline:  Goal status: INITIAL  4.  Pt will tell SLP rationale for thorough oral care after 02/14/23 Baseline:  Goal status: INITIAL   PLAN:  SLP FREQUENCY: 1x/week  SLP DURATION: 8 weeks  PLANNED INTERVENTIONS: 92526 Treatment of swallowing function, Aspiration precaution training, Pharyngeal strengthening exercises, Diet toleration management , Environmental controls, Trials of upgraded texture/liquids, Internal/external aids, SLP instruction and feedback, Compensatory strategies, and [name]/family education    [facility], CCC-SLP 01/14/2023, 11:37 AM For all possible CPT codes, reference the Planned Interventions line above.     Check all conditions that are expected to impact treatment: {Conditions expected to impact treatment:Respiratory disorders, Contractures, spasticity or fracture relevant to requested treatment, and Social determinants of health   If treatment provided at initial evaluation, no treatment charged due to lack of authorization.

## 2023-01-16 ENCOUNTER — Inpatient Hospital Stay: Payer: MEDICAID | Admitting: Dietician

## 2023-01-16 ENCOUNTER — Other Ambulatory Visit: Payer: Self-pay | Admitting: Student

## 2023-01-17 ENCOUNTER — Telehealth: Payer: Self-pay | Admitting: Nurse Practitioner

## 2023-01-17 NOTE — Telephone Encounter (Signed)
Scheduled appointment per scheduling message. Left VM for the patients mother Mrs.[name] with the appointment details.

## 2023-01-20 ENCOUNTER — Ambulatory Visit: Payer: MEDICAID

## 2023-01-20 DIAGNOSIS — R490 Dysphonia: Secondary | ICD-10-CM

## 2023-01-20 DIAGNOSIS — R1312 Dysphagia, oropharyngeal phase: Secondary | ICD-10-CM | POA: Diagnosis not present

## 2023-01-20 NOTE — Therapy (Signed)
OUTPATIENT SPEECH LANGUAGE PATHOLOGY SWALLOW TREATMENT   Patient Name: David [name] MRN: [id number] DOB:Aug 28, 1975, 47 y.o., male Today's Date: 01/20/2023  PCP: Faith Rogue, MD REFERRING PROVIDER: Lonie Peak, MD  END OF SESSION:  End of Session - 01/20/23 1154     Visit Number 2    Number of Visits 9    Date for SLP Re-Evaluation 03/14/23    SLP Start Time 1104    SLP Stop Time  1128    SLP Time Calculation (min) 24 min    Activity Tolerance Patient tolerated treatment well              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)    Pneumococcal bacteremia 06/09/2021   Pneumothorax    Left lung spontaneous pneumothorax at age 20 yr    S/P percutaneous endoscopic gastrostomy (PEG) tube placement (HCC) 06/24/2021   Past Surgical History:  Procedure Laterality Date   chest tubes Left    IR GASTROSTOMY TUBE MOD SED  06/18/2021   LAPAROSCOPIC INSERTION GASTROSTOMY TUBE N/A 06/20/2021   Procedure: LAPAROSCOPIC INSERTION GASTROSTOMY TUBE;  Surgeon: Berna Bue, MD;  Location: [facility] OR;  Service: General;  Laterality: N/A;  DOW PATIENT   TRACHEOSTOMY TUBE PLACEMENT N/A 06/09/2021   Procedure: AWAKE TRACHEOSTOMY;  Surgeon: Christia Reading, MD;  Location: [facility] OR;  Service: ENT;  Laterality: N/A;   Patient Active Problem List   Diagnosis Date Noted   FTT (failure to thrive) in adult 12/31/2022   Dysphagia, unspecified 12/31/2022   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 12/31/2022    Hepatitis A immune 11/14/2022   End of life care 10/23/2022   Acute purulent bronchitis 10/23/2022   Hepatitis C 10/09/2022   Left leg pain 10/09/2022   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   History of pancytopenia 09/12/2022   Torticollis 07/04/2022   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   Defective dental restoration 12/31/2021   Chronic periodontitis 12/31/2021   Impacted third molar tooth 12/31/2021   Irritant contact dermatitis associated with digestive stoma 12/25/2021   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   Continuous tobacco abuse 11/21/2021   Dermatitis associated with moisture 10/16/2021   Back pain 08/16/2021   Candidal endocarditis    History of radiation to head and neck region 07/11/2021   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   Dysgeusia 07/11/2021   Pressure injury of skin 06/17/2021   Tracheostomy status (HCC)    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   Heart failure with reduced ejection fraction (HCC) 04/28/2021   Anxiety 04/28/2021   Elevated troponin 04/01/2021   Elevated transaminase level 04/01/2021   Weight loss, unintentional 01/15/2021   Protein-calorie malnutrition, severe (HCC) 01/10/2021   Hypotension 01/08/2021   Chemotherapy induced nausea and vomiting 01/01/2021   Encounter for dental examination 12/13/2020   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   Teeth missing 12/04/2020   Radiation caries 12/04/2020   Retained tooth root 12/04/2020   Chronic apical periodontitis 12/04/2020  Accretions on teeth 12/04/2020    ONSET DATE: 2023   REFERRING DIAG:  C32.9 (ICD-10-CM) - Laryngeal cancer (HCC)    THERAPY DIAG:  Dysphagia, oropharyngeal phase  Hoarseness of voice  Rationale for Evaluation and Treatment: Rehabilitation  SUBJECTIVE:   SUBJECTIVE STATEMENT: "I had biscuits and gravy for breakfast." Pt accompanied by: self  PERTINENT HISTORY: Pt well known to this  clinician from previous plans of care. HPI: s/p chemo and XRT. Has had slow but steady overall decline over the summer. Specifically significant adult FTT, torticollis with right ear touching right shoulder, limited ROM of neck and dependent lymphedema right face, mild stage 1 pressure/breakdown to left of trach stoma. He is followed by Anders Simmonds at trach clinic, most recently 12/31/22; trach changed to Portex Bivona size 7. Significant air trapping with capping valve, not a candidate for decannulation. Hx trach 06/09/21, PEG 06/20/21, subsequently "fell out" per his report. Most recent MBS 07/25/21: mild pharyngeal residuals, head turn to right, cough/re-swallow helped with airway protection. Aspiration of thins. Regular/thins recommended. OP SLP from 9-12/23. Pt reports worsening dysphagia with coughing, occasional nasal regurgitation. MBS completed 01/10/23, results below.   PAIN:  Are you having pain? No  FALLS: Has patient fallen in last 6 months?  Yes; one  PATIENT GOALS: get this swallowing better  OBJECTIVE:  Note: Objective measures were completed at Evaluation unless otherwise noted. 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  Quality of imaging was compromised by shoulder/neck positioning and it was difficult to visualize anatomical markers. There were no incidents of nasal regurgitation on today's study (reported to be happening more frequently). There was minimal mobility of the larynx and incomplete laryngeal closure, leading to intermittent aspiration of thin and nectar thick liquids, occurring before and after the swallow response. There was frequent tongue pumping and effort to propel material from mouth through pharynx, leaving residuals in  posterior oral cavity with multiple f/u swallows needed to move bolus into throat. There was minimal pharyngeal residue post-swallow.    Objective recommended compensations:  Recommendations: PO diet PO Diet Recommendation: Regular; Thin liquids (Level 0) Medication Administration: Whole meds with puree Supervision: Patient able to self-feed Swallowing strategies  : Place PMSV during PO intake Oral care recommendations: Oral care QID (4x/day)  PATIENT REPORTED OUTCOME MEASURES (PROM): EAT-10: to be completed during first session   TODAY'S TREATMENT:                                                                                                                                         DATE:  01/20/23: Pt needs EAT-10 next session. He ate biscuits and gravy this morning for breakfast. SLP reiterated soft foods to pt. Pt enters today  with visibly soiled shirt with what appears to be dried food or dried phlegm, and a blood stain (small) on jacket. He completed HEP exercises with independence. He has completed all exercises but "not 20/day like you said". SLP encouraged pt he may not be able to achieve 20 each exercise, currently, but to do as many as he can at this time and he should be able to incr reps as time goes on. He acknowledged understanding.  Pt removed trach during session today and SLP expressed concern about this. Pt assured SLP he is performing trach cleaning and care each day, and good oral hygiene as well.   01/14/23 (eval): see "education" below  PATIENT EDUCATION: Education details: swallow precautions, swallow HEP, need for thorough oral care x4/day, aspiration PNA s/sx and that pt is at higher risk for this Person educated: Patient Education method: Explanation, Demonstration, Verbal cues, and Handouts Education comprehension: verbalized understanding, returned demonstration, verbal cues required, and needs further education   ASSESSMENT:  CLINICAL IMPRESSION: Patient is  a 47 y.o. M who was seen today for treatment of swallow after MBS 01/10/23. Pt is currently being seen for torticollis by PT. He completed exercises on his dysphagia HEP independently today, and stated he places his PMSV for all POs. Signs and sx of aspiration PNA were provided today. Success of HEP on pt's overall swallow safety at this time is unknown but SLP will educate pt about and cont to encourage pt to complete HEP and follow all swallow safety recommendations.  OBJECTIVE IMPAIRMENTS: include voice disorder and dysphagia. These impairments are limiting patient from effectively communicating at home and in community and safety when swallowing. Factors affecting potential to achieve goals and functional outcome are cooperation/participation level, medical prognosis, previous level of function, severity of impairments, and financial resources. Patient will benefit from skilled SLP services to address above impairments and improve overall function.  REHAB POTENTIAL: Fair given above factors   GOALS: Goals reviewed with patient? Yes  SHORT TERM GOALS: Target date: 02/14/23  Pt will complete HEP with rare min A Baseline: Goal status: INITIAL  2.  Pt will follow swallow strategies from MBS with POs x2 visits Baseline:  Goal status: INITIAL  3.  Pt will tell SLP 3 overt s/sx aspiration PNA with modified independence in 2 sessions Baseline: 01/20/23 Goal status: INITIAL  4.  Pt will tell SLP rationale for thorough oral care in 2 sessions Baseline:  Goal status: INITIAL   LONG TERM GOALS: Target date: 03/14/23  Pt will improve PROM compared to initial administration Baseline:  Goal status: INITIAL  2.  Pt will follow swallow strategies in 1 session after 02/14/23 Baseline:  Goal status: INITIAL  3.  Pt will tell SLP 3 overt s/sx aspiration PNA in 2 sessions Baseline:  Goal status: INITIAL  4.  Pt will tell SLP rationale for thorough oral care after 02/14/23 Baseline:  Goal  status: INITIAL   PLAN:  SLP FREQUENCY: 1x/week  SLP DURATION: 8 weeks  PLANNED INTERVENTIONS: 92526 Treatment of swallowing function, Aspiration precaution training, Pharyngeal strengthening exercises, Diet toleration management , Environmental controls, Trials of upgraded texture/liquids, Internal/external aids, SLP instruction and feedback, Compensatory strategies, and Patient/family education    [facility], CCC-SLP 01/20/2023, 11:54 AM For all possible CPT codes, reference the Planned Interventions line above.     Check all conditions that are expected to impact treatment: {Conditions expected to impact treatment:Respiratory disorders, Contractures, spasticity or fracture relevant to requested treatment, and Social determinants of health  If treatment provided at initial evaluation, no treatment charged due to lack of authorization.

## 2023-01-20 NOTE — Patient Instructions (Signed)
   Signs of Aspiration Pneumonia   Chest pain/tightness Fever (can be low grade) Cough  With foul-smelling phlegm (sputum) With sputum containing pus or blood With greenish sputum Fatigue  Shortness of breath  Wheezing   **IF YOU HAVE THESE SIGNS, CONTACT YOUR DOCTOR OR GO TO THE EMERGENCY DEPARTMENT OR URGENT CARE AS SOON AS POSSIBLE**     

## 2023-01-23 ENCOUNTER — Telehealth: Payer: Self-pay

## 2023-01-23 NOTE — Telephone Encounter (Signed)
Pt mother, Yehuda Mao, was called and instructed to have chris f/u with his ENT per Lowella Bandy, NP. [name] verbalized understanding. No further needs at this time.

## 2023-01-24 ENCOUNTER — Encounter: Payer: Self-pay | Admitting: Surgery

## 2023-01-24 ENCOUNTER — Ambulatory Visit (INDEPENDENT_AMBULATORY_CARE_PROVIDER_SITE_OTHER): Payer: MEDICAID | Admitting: Surgery

## 2023-01-24 VITALS — BP 106/76 | HR 72 | Temp 97.6°F

## 2023-01-24 DIAGNOSIS — I82C29 Chronic embolism and thrombosis of unspecified internal jugular vein: Secondary | ICD-10-CM | POA: Diagnosis not present

## 2023-01-24 NOTE — Progress Notes (Signed)
Vascular and Vein Specialist of [facility]  Patient name: David [name] MRN: [id number] DOB: 1975/10/17 Sex: male   REQUESTING PROVIDER:    Dr. Ninetta Lights   REASON FOR CONSULT:    Internal jugular DVT  HISTORY OF PRESENT ILLNESS:   David [name] is a 47 y.o. male, who fell for a right internal jugular vein DVT while he was in the hospital on September 05, 2022.  He had developed swelling around his eye.  A CT scan showed right internal jugular vein occlusion.  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  He is completed chemotherapy in 2023.  He had a tracheostomy in May 2023.  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  He is a continual smoker.  He has a history of spontaneous pneumothorax 20 years ago.  The patient did get better on IV heparin.  He was converted to a DOAC and ultimately discharged without intervention.  He is back today for follow-up.  He says that the eye swelling is intermittent but it does not really bother him.  He is tolerating his blood thinner  PAST MEDICAL HISTORY    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 [facility])    Pneumococcal bacteremia 06/09/2021   Pneumothorax    Left lung spontaneous pneumothorax at age 32 yr    S/P percutaneous endoscopic gastrostomy (PEG) tube placement (HCC) 06/24/2021     FAMILY HISTORY   No family history on file.  SOCIAL HISTORY:   Social History    Socioeconomic History   Marital status: Divorced    Spouse name: Not on file   Number of children: Not on file   Years of education: Not on file   Highest education level: Not on file  Occupational History   Not on file  Tobacco Use   Smoking status: Every Day    Types: Cigarettes   Smokeless tobacco: Never   Tobacco comments:    1 cigs per day  Vaping Use   Vaping status: Never Used  Substance and Sexual Activity   Alcohol use: Not Currently    Alcohol/week: 12.0 standard drinks of alcohol    Types: 12 Cans of beer per week   Drug use: Not Currently    Types: IV    Comment: 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)   Sexual activity: Not on file  Other Topics Concern   Not on file  Social History Narrative   Not on file   Social Drivers of Health   Financial Resource Strain: Low Risk  (03/19/2022)   Overall Financial Resource Strain (CARDIA)    Difficulty of Paying Living Expenses: Not hard at all  Food Insecurity: Patient Declined (09/12/2022)   Hunger Vital Sign    Worried About Running Out of Food in the Last Year: Patient declined    Ran Out of Food in the Last Year: Patient declined  Transportation Needs: Patient Declined (09/12/2022)   PRAPARE - Transportation  Lack of Transportation (Medical): Patient declined    Lack of Transportation (Non-Medical): Patient declined  Physical Activity: Insufficiently Active (03/19/2022)   Exercise Vital Sign    Days of Exercise per Week: 3 days    Minutes of Exercise per Session: 20 min  Stress: No Stress Concern Present (03/19/2022)   [employer] of Occupational Health - Occupational Stress Questionnaire    Feeling of Stress : Only a little  Social Connections: Socially Isolated (03/19/2022)   Social Connection and Isolation Panel [NHANES]    Frequency of Communication with Friends and Family: More than three times a week    Frequency of Social Gatherings with Friends and Family: Three times a week     Attends Religious Services: Never    Active Member of Clubs or Organizations: No    Attends Banker Meetings: Never    Marital Status: Divorced  Catering manager Violence: Patient Declined (09/12/2022)   Humiliation, Afraid, Rape, and Kick questionnaire    Fear of Current or Ex-Partner: Patient declined    Emotionally Abused: Patient declined    Physically Abused: Patient declined    Sexually Abused: Patient declined    ALLERGIES:    No Known Allergies  CURRENT MEDICATIONS:    Current Outpatient Medications  Medication Sig Dispense Refill   diazepam (VALIUM) 1 MG/ML solution Take 2 mLs (2 mg total) by mouth every 8 (eight) hours as needed for muscle spasms (Dysphagia). 100 mL 0   ELIQUIS 5 MG TABS tablet TAKE 1 TABLET BY MOUTH TWICE A DAY 60 tablet 0   fluconazole (DIFLUCAN) 200 MG tablet Take 800 mg by mouth daily.     Glecaprevir-Pibrentasvir 100-40 MG TABS Take 1 tablet by mouth daily.     lidocaine (XYLOCAINE) 2 % solution Use as directed 15 mLs in the mouth or throat every 3 (three) hours as needed for mouth pain. Mix 1part 2% viscous lidocaine, 1part water. Swish and spit 30mL of total diluted mixture up to eight times a day, prn as needed for soreness 200 mL 2   midodrine (PROAMATINE) 5 MG tablet TAKE 1 TABLET (5 MG TOTAL) BY MOUTH 3 (THREE) TIMES DAILY WITH MEALS. 270 tablet 2   ondansetron (ZOFRAN) 4 MG tablet Take 1 tablet (4 mg total) by mouth every 8 (eight) hours as needed for nausea or vomiting. 30 tablet 1   Pregabalin 20 MG/ML SOLN Take 40 mg by mouth 3 (three) times daily. 120 mL 1   sertraline (ZOLOFT) 20 MG/ML concentrated solution Mix 1.25 mLs (25 mg total)  with 1/2 cup of water and take by mouth daily. 60 mL 2   No current facility-administered medications for this visit.    REVIEW OF SYSTEMS:   [X]  denotes positive finding, [ ]  denotes negative finding Cardiac  Comments:  Chest pain or chest pressure:    Shortness of breath upon exertion:     Short of breath when lying flat:    Irregular heart rhythm:        Vascular    Pain in calf, thigh, or hip brought on by ambulation:    Pain in feet at night that wakes you up from your sleep:     Blood clot in your veins:    Leg swelling:         Pulmonary    Oxygen at home:    Productive cough:     Wheezing:         Neurologic    Sudden weakness in arms  or legs:     Sudden numbness in arms or legs:     Sudden onset of difficulty speaking or slurred speech:    Temporary loss of vision in one eye:     Problems with dizziness:         Gastrointestinal    Blood in stool:      Vomited blood:         Genitourinary    Burning when urinating:     Blood in urine:        Psychiatric    Major depression:         Hematologic    Bleeding problems:    Problems with blood clotting too easily:        Skin    Rashes or ulcers:        Constitutional    Fever or chills:     PHYSICAL EXAM:   Vitals:   01/24/23 1054  BP: 106/76  Pulse: 72  Temp: 97.6 F (36.4 C)  TempSrc: Temporal  SpO2: 98%    GENERAL: The patient is a well-nourished male, in no acute distress. The vital signs are documented above. CARDIAC: There is a regular rate and rhythm.  VASCULAR: Contracted right neck.  No significant swelling PULMONARY: Nonlabored respirations SKIN: There are no ulcers or rashes noted. PSYCHIATRIC: The patient has a normal affect.  STUDIES:   CT scan: 1. New obstruction of the right internal jugular vein, likely secondary to a peripherally calcified and previously biopsied right level 2A lymph node, with associated extensive subcutaneous edema throughout the right face and involving the right parotid and submandibular glands. 2. Extensive dental caries.  ASSESSMENT and PLAN   Internal jugular vein DVT: This was most likely provoked from an attempted biopsy and as result of his stage IV malignancy.  No intervention has been recommended given his minimal symptoms.  I  have recommended lifelong anticoagulation.  He is in agreement with this.  He will follow-up as needed   Durene Cal, IV, MD, FACS [facility] [phone] Pager [phone]

## 2023-01-27 ENCOUNTER — Other Ambulatory Visit: Payer: Self-pay | Admitting: Nurse Practitioner

## 2023-01-27 ENCOUNTER — Ambulatory Visit: Payer: MEDICAID

## 2023-01-27 ENCOUNTER — Telehealth: Payer: Self-pay

## 2023-01-27 DIAGNOSIS — C329 Malignant neoplasm of larynx, unspecified: Secondary | ICD-10-CM

## 2023-01-27 DIAGNOSIS — R1312 Dysphagia, oropharyngeal phase: Secondary | ICD-10-CM

## 2023-01-27 DIAGNOSIS — Z515 Encounter for palliative care: Secondary | ICD-10-CM

## 2023-01-27 DIAGNOSIS — M62838 Other muscle spasm: Secondary | ICD-10-CM

## 2023-01-27 DIAGNOSIS — F419 Anxiety disorder, unspecified: Secondary | ICD-10-CM

## 2023-01-27 DIAGNOSIS — R490 Dysphonia: Secondary | ICD-10-CM

## 2023-01-27 NOTE — Telephone Encounter (Signed)
[facility] pharmacy requesting refill on naproxen (NAPROSYN) 500 MG tablet. Not on medication list.

## 2023-01-27 NOTE — Therapy (Signed)
OUTPATIENT SPEECH LANGUAGE PATHOLOGY SWALLOW TREATMENT   Patient Name: David [name] MRN: [id number] DOB:02-Aug-1975, 47 y.o., male Today's Date: 01/27/2023  PCP: Faith Rogue, MD REFERRING PROVIDER: Lonie Peak, MD  END OF SESSION:  End of Session - 01/27/23 1324     Visit Number 3    Number of Visits 9    Date for SLP Re-Evaluation 03/14/23    Activity Tolerance Patient tolerated treatment well              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)    Pneumococcal bacteremia 06/09/2021   Pneumothorax    Left lung spontaneous pneumothorax at age 5 yr    S/P percutaneous endoscopic gastrostomy (PEG) tube placement (HCC) 06/24/2021   Past Surgical History:  Procedure Laterality Date   chest tubes Left    IR GASTROSTOMY TUBE MOD SED  06/18/2021   LAPAROSCOPIC INSERTION GASTROSTOMY TUBE N/A 06/20/2021   Procedure: LAPAROSCOPIC INSERTION GASTROSTOMY TUBE;  Surgeon: Berna Bue, MD;  Location: [facility] OR;  Service: General;  Laterality: N/A;  DOW PATIENT   TRACHEOSTOMY TUBE PLACEMENT N/A 06/09/2021   Procedure: AWAKE TRACHEOSTOMY;  Surgeon: Christia Reading, MD;  Location: [facility] OR;  Service: ENT;  Laterality: N/A;   Patient Active Problem List   Diagnosis Date Noted   FTT (failure to thrive) in adult 12/31/2022   Dysphagia, unspecified 12/31/2022   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 12/31/2022   Hepatitis A immune 11/14/2022   End of life care 10/23/2022   Acute purulent  bronchitis 10/23/2022   Hepatitis C 10/09/2022   Left leg pain 10/09/2022   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   History of pancytopenia 09/12/2022   Torticollis 07/04/2022   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   Defective dental restoration 12/31/2021   Chronic periodontitis 12/31/2021   Impacted third molar tooth 12/31/2021   Irritant contact dermatitis associated with digestive stoma 12/25/2021   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   Continuous tobacco abuse 11/21/2021   Dermatitis associated with moisture 10/16/2021   Back pain 08/16/2021   Candidal endocarditis    History of radiation to head and neck region 07/11/2021   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   Dysgeusia 07/11/2021   Pressure injury of skin 06/17/2021   Tracheostomy status (HCC)    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   Heart failure with reduced ejection fraction (HCC) 04/28/2021   Anxiety 04/28/2021   Elevated troponin 04/01/2021   Elevated transaminase level 04/01/2021   Weight loss, unintentional 01/15/2021   Protein-calorie malnutrition, severe (HCC) 01/10/2021   Hypotension 01/08/2021   Chemotherapy induced nausea and vomiting 01/01/2021   Encounter for dental examination 12/13/2020   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   Teeth missing 12/04/2020   Radiation caries 12/04/2020   Retained tooth root 12/04/2020   Chronic apical periodontitis 12/04/2020   Accretions on teeth 12/04/2020    ONSET DATE: 2023   REFERRING DIAG:  C32.9 (ICD-10-CM) - Laryngeal cancer (HCC)  THERAPY DIAG:  Dysphagia, oropharyngeal phase  Hoarseness of voice  Rationale for Evaluation and Treatment: Rehabilitation  SUBJECTIVE:   SUBJECTIVE STATEMENT: "I had biscuits and gravy and grilled chicken sandwich."  Pt accompanied by: self  PERTINENT HISTORY: Pt well known to this clinician from previous plans of care. HPI: s/p chemo and XRT. Has  had slow but steady overall decline over the summer. Specifically significant adult FTT, torticollis with right ear touching right shoulder, limited ROM of neck and dependent lymphedema right face, mild stage 1 pressure/breakdown to left of trach stoma. He is followed by Anders Simmonds at trach clinic, most recently 12/31/22; trach changed to [place] Bivona size 7. Significant air trapping with capping valve, not a candidate for decannulation. Hx trach 06/09/21, PEG 06/20/21, subsequently "fell out" per his report. Most recent MBS 07/25/21: mild pharyngeal residuals, head turn to right, cough/re-swallow helped with airway protection. Aspiration of thins. Regular/thins recommended. OP SLP from 9-12/23. Pt reports worsening dysphagia with coughing, occasional nasal regurgitation. MBS completed 01/10/23, results below.   PAIN:  Are you having pain? No  FALLS: Has patient fallen in last 6 months?  Yes; one  PATIENT GOALS: get this swallowing better  OBJECTIVE:  Note: Objective measures were completed at Evaluation unless otherwise noted. 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  Quality of imaging was compromised by shoulder/neck positioning and it was difficult to visualize anatomical markers. There were no incidents of nasal regurgitation on today's study (reported to be happening more frequently). There was minimal mobility of the larynx and incomplete laryngeal closure, leading to intermittent aspiration of thin and nectar thick liquids, occurring before and after the swallow response. There was frequent tongue pumping and effort to propel material from mouth through pharynx, leaving residuals in posterior oral cavity with multiple f/u swallows needed to move  bolus into throat. There was minimal pharyngeal residue post-swallow.    Objective recommended compensations:  Recommendations: PO diet PO Diet Recommendation: Regular; Thin liquids (Level 0) Medication Administration: Whole meds with puree Supervision: Patient able to self-feed Swallowing strategies  : Place PMSV during PO intake Oral care recommendations: Oral care QID (4x/day)  PATIENT REPORTED OUTCOME MEASURES (PROM): EAT-10: to be completed during first session   TODAY'S TREATMENT:                                                                                                                                         DATE:  01/27/23: SLP provided pt with fig bar and water and pt with hydrophonic voice after 3 bite/sip combinations. SLP told pt to swallow x3 for each bite or sip and he req'd usual min A for this faded to occasional min A. Frequency of hydrophonic  voice decr'd after this was suggested by SLP. Pt cont to eat softer foods that don't require as much chewing. SLP again provided pt with overt s/sx aspiration PNA. SLP reiterated pt keep as active as possible with regular walking/exercise to reduce risk of aspiration PNA. SLP also reminded pt of x4/day thorough oral care. Pt told SLP he was performing oral care x3/day regularly and x4 sometimes.  With HEP, pt req'd cues with sequencing of Supraglottic, but was independent by session end. Other exercises were completed with independence. Pt stated he was getting x10/day of each but stated he was working his way to x20/day for each. SLP encouraged him to stay consistent and he has the best chance for this to occur.   01/20/23: Pt needs EAT-10 next session. He ate biscuits and gravy this morning for breakfast. SLP reiterated soft foods to pt. Pt enters today with visibly soiled shirt with what appears to be dried food or dried phlegm, and a blood stain (small) on jacket. He completed HEP exercises with independence. He has completed all  exercises but "not 20/day like you said". SLP encouraged pt he may not be able to achieve 20 each exercise, currently, but to do as many as he can at this time and he should be able to incr reps as time goes on. He acknowledged understanding.  Pt removed trach during session today and SLP expressed concern about this. Pt assured SLP he is performing trach cleaning and care each day, and good oral hygiene as well.   01/14/23 (eval): see "education" below  PATIENT EDUCATION: Education details: swallow precautions, swallow HEP, need for thorough oral care x4/day, aspiration PNA s/sx and that pt is at higher risk for this Person educated: Patient Education method: Explanation, Demonstration, Verbal cues, and Handouts Education comprehension: verbalized understanding, returned demonstration, verbal cues required, and needs further education   ASSESSMENT:  CLINICAL IMPRESSION: Patient is a 47 y.o. M who was seen today for treatment of swallow after MBS 01/10/23. Pt is currently being seen for torticollis by PT. See "today's treatment" for more details of today's session. Success of HEP on pt's overall swallow safety at this time is unknown but SLP will educate pt about and cont to encourage pt to complete HEP and follow all swallow safety recommendations.  OBJECTIVE IMPAIRMENTS: include voice disorder and dysphagia. These impairments are limiting patient from effectively communicating at home and in community and safety when swallowing. Factors affecting potential to achieve goals and functional outcome are cooperation/participation level, medical prognosis, previous level of function, severity of impairments, and financial resources. Patient will benefit from skilled SLP services to address above impairments and improve overall function.  REHAB POTENTIAL: Fair given above factors   GOALS: Goals reviewed with patient? Yes  SHORT TERM GOALS: Target date: 02/14/23  Pt will complete HEP with rare  min A Baseline: Goal status: INITIAL  2.  Pt will follow swallow strategies from MBS with POs x2 visits Baseline:  Goal status: INITIAL  3.  Pt will tell SLP 3 overt s/sx aspiration PNA with modified independence in 2 sessions Baseline: 01/20/23 Goal status: met  4.  Pt will tell SLP rationale for thorough oral care in 2 sessions Baseline:  Goal status: INITIAL   LONG TERM GOALS: Target date: 03/14/23  Pt will improve PROM compared to initial administration Baseline:  Goal status: INITIAL  2.  Pt will follow swallow strategies in 1 session after 02/14/23 Baseline:  Goal status: INITIAL  3.  Pt will  tell SLP 3 overt s/sx aspiration PNA in 2 sessions Baseline:  Goal status: INITIAL  4.  Pt will tell SLP rationale for thorough oral care after 02/14/23 Baseline:  Goal status: INITIAL   PLAN:  SLP FREQUENCY: 1x/week  SLP DURATION: 8 weeks  PLANNED INTERVENTIONS: 92526 Treatment of swallowing function, Aspiration precaution training, Pharyngeal strengthening exercises, Diet toleration management , Environmental controls, Trials of upgraded texture/liquids, Internal/external aids, SLP instruction and feedback, Compensatory strategies, and Patient/family education    [facility], CCC-SLP 01/27/2023, 1:24 PM For all possible CPT codes, reference the Planned Interventions line above.     Check all conditions that are expected to impact treatment: {Conditions expected to impact treatment:Respiratory disorders, Contractures, spasticity or fracture relevant to requested treatment, and Social determinants of health   If treatment provided at initial evaluation, no treatment charged due to lack of authorization.

## 2023-01-27 NOTE — Patient Instructions (Signed)
   Signs of Aspiration Pneumonia   Chest pain/tightness Fever (can be low grade) Cough  With foul-smelling phlegm (sputum) With sputum containing pus or blood With greenish sputum Fatigue  Shortness of breath  Wheezing   **IF YOU HAVE THESE SIGNS, CONTACT YOUR DOCTOR OR GO TO THE EMERGENCY DEPARTMENT OR URGENT CARE AS SOON AS POSSIBLE**     

## 2023-01-28 ENCOUNTER — Inpatient Hospital Stay: Payer: MEDICAID | Admitting: Dietician

## 2023-01-28 NOTE — Progress Notes (Signed)
 Nutrition Follow up:    Reached out to mom to monitor patient's PO intake and changes since starting with speech therapy. Mom reports appetite has improved. Eating chicken salad sandwiches, drinking Ensure plus and shake  made with Ensure plus, peanut butter and banana. He is gaining weight weight.  No restrictions on swallowing tests. Mom couldn't remember getting recipe for the high protein pudding, offered to resend.  Medications: reviewed  Labs:  No new  Anthropometrics: weight has been improving  Height: 71 Weight:  01/28/23  97.8# (reported today) 12/1022  90.7# 12/20/22  88# UBW: has been fluctuating past year 88-104# range BMI: 13.6    NUTRITION DIAGNOSIS: Inadequate PO intake to meet increased nutrient needs, r/t dysphagia continues   MALNUTRITION DIAGNOSIS: Severe malnutrition continues    INTERVENTION:  Encouraged return to Ensure plus BID, offered more coupons she declined at this time   Encouraged continued use of small frequent feeds and scheduled meals  Encouraged continuing weekly weights  Emailed ([name]) recipe chocolate peanut butter pudding, with contact information provided  MONITORING, EVALUATION, GOAL: weight, PO intake, Nutrition Impact Symptoms, labs Goal is weight gain 1-2 pounds per week  Next Visit: PRN at patient/mom or provider request  Micheline Craven, RDN, LDN Registered Dietitian, [facility] Part Time Remote (Usual office hours: Tuesday-Thursday) Cell: [phone]

## 2023-01-31 ENCOUNTER — Ambulatory Visit: Payer: MEDICAID | Attending: Radiation Oncology | Admitting: Physical Therapy

## 2023-01-31 ENCOUNTER — Telehealth: Payer: Self-pay

## 2023-01-31 ENCOUNTER — Encounter: Payer: Self-pay | Admitting: Physical Therapy

## 2023-01-31 DIAGNOSIS — R293 Abnormal posture: Secondary | ICD-10-CM | POA: Insufficient documentation

## 2023-01-31 DIAGNOSIS — M6281 Muscle weakness (generalized): Secondary | ICD-10-CM | POA: Insufficient documentation

## 2023-01-31 DIAGNOSIS — M542 Cervicalgia: Secondary | ICD-10-CM | POA: Insufficient documentation

## 2023-01-31 NOTE — Telephone Encounter (Signed)
 Rx refill request from the pharmacy for Naproxen,medication has expired off of med list  [id number] - [place], [place] - [name]

## 2023-01-31 NOTE — Therapy (Signed)
 OUTPATIENT PHYSICAL THERAPY CERVICAL TREATMENT   Patient Name: David [name] MRN: [id number] DOB:05/15/1975, 48 y.o., male Today's Date: 01/31/2023  END OF SESSION:  PT End of Session - 01/31/23 0849     Visit Number 2    Date for PT Re-Evaluation 03/10/23    Authorization Type trillium approved 8 visits 01/20/2023-03/10/2023 [id number]    Authorization Time Period 01/20/2023-03/10/2023    Authorization - Visit Number 1    Authorization - Number of Visits 8    PT Start Time 0800    PT Stop Time 0840    PT Time Calculation (min) 40 min               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)    Pneumococcal bacteremia 06/09/2021   Pneumothorax    Left lung spontaneous pneumothorax at age 64 yr    S/P percutaneous endoscopic gastrostomy (PEG) tube placement (HCC) 06/24/2021   Past Surgical History:  Procedure Laterality Date   chest tubes Left    IR GASTROSTOMY TUBE MOD SED  06/18/2021   LAPAROSCOPIC INSERTION GASTROSTOMY TUBE N/A 06/20/2021   Procedure: LAPAROSCOPIC INSERTION GASTROSTOMY TUBE;  Surgeon: Signe Mitzie LABOR, MD;  Location: [facility] OR;  Service: General;  Laterality: N/A;  DOW PATIENT   TRACHEOSTOMY TUBE PLACEMENT N/A 06/09/2021   Procedure: AWAKE TRACHEOSTOMY;  Surgeon: Carlie Clark, MD;  Location: [facility] OR;  Service: ENT;  Laterality: N/A;   Patient Active Problem List   Diagnosis Date Noted   FTT (failure to thrive) in adult 12/31/2022   Dysphagia,  unspecified 12/31/2022   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 12/31/2022   Hepatitis A immune 11/14/2022   End of life care 10/23/2022   Acute purulent bronchitis 10/23/2022   Hepatitis C 10/09/2022   Left leg pain 10/09/2022   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   History of pancytopenia 09/12/2022   Torticollis 07/04/2022   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   Defective dental restoration 12/31/2021   Chronic periodontitis 12/31/2021   Impacted third molar tooth 12/31/2021   Irritant contact dermatitis associated with digestive stoma 12/25/2021   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   Continuous tobacco abuse 11/21/2021   Dermatitis associated with moisture 10/16/2021   Back pain 08/16/2021   Candidal endocarditis    History of radiation to head and neck region 07/11/2021   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   Dysgeusia 07/11/2021   Pressure injury of skin 06/17/2021   Tracheostomy status (HCC)    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   Heart failure with reduced ejection fraction (HCC) 04/28/2021   Anxiety 04/28/2021   Elevated troponin 04/01/2021   Elevated transaminase level 04/01/2021   Weight loss, unintentional 01/15/2021   Protein-calorie malnutrition, severe (HCC) 01/10/2021   Hypotension 01/08/2021   Chemotherapy induced nausea and vomiting 01/01/2021   Encounter for dental examination 12/13/2020   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   Teeth missing 12/04/2020   Radiation caries 12/04/2020   Retained tooth root  12/04/2020   Chronic apical periodontitis 12/04/2020   Accretions on teeth 12/04/2020    PCP: Gawaluck, [place], MD   REFERRING PROVIDER: [place] Fannie SAILOR, NP   REFERRING DIAG: Diagnosis Information  Diagnosis  C32.9 (ICD-10-CM) - Laryngeal cancer (HCC)    THERAPY DIAG:  Cervicalgia  Abnormal posture  Muscle weakness (generalized)  Rationale for Evaluation and  Treatment: Rehabilitation  ONSET DATE: 1.5 year history (trach placement)  SUBJECTIVE:                                                                                                                                                                                                         SUBJECTIVE STATEMENT: Patient reports he is doing good today. He has been compliant with HEP. He is having some neck soreness.  From Eval: Pt presents to PT with Rt sided neck tension and shortened tissue secondary to radiation.  Pt has difficulty holding up his head due to pain, postural weakness, and shortened tissue on the Rt due to radiation.  Pt was discharged from PT in August due to clot in Rt jugular vein.  Clot is now resolved.  He reports that he has been stretching and working on posture at home.   Hand dominance: Right  PERTINENT HISTORY:  Tracheostomy, radiation to neck, malignant neoplasm of supraglottis, thrombosis of Rt internal jugular vein, torticollis, heart failure  PAIN: 01/31/2023 Are you having pain? Yes: NPRS scale: 3/10 Pain location: Rt neck Pain description: stiff, throbbing Aggravating factors: overstretching, trying to stretch  Relieving factors: massaging, medication  PRECAUTIONS: Other: cancer, trach  WEIGHT BEARING RESTRICTIONS: No  FALLS:  Has patient fallen in last 6 months? No  LIVING ENVIRONMENT: Lives with: lives alone Lives in: House/apartment   PLOF: Independent, walking to mailbox  PATIENT GOALS: reduce neck pain, improve mobility   NEXT MD VISIT:  January 2025  OBJECTIVE:   DIAGNOSTIC FINDINGS:  None   PATIENT SURVEYS:  NDI 15/50  COGNITION: Overall cognitive status: Within functional limits for tasks assessed  SENSATION: WFL  POSTURE: rounded shoulders, forward head, increased thoracic kyphosis, and flexed trunk   PALPATION: Significant tension and reduced muscle length in bil neck. Fixed cervical spine segments with minimal mobility  allowed.    CERVICAL ROM:   Active ROM A/PROM (deg) 01/13/23  Flexion Rests in 30 degrees flexion  Extension   Right lateral flexion Rests in 30 degrees  Left lateral flexion 10 degrees Rt lateral flexion  Right rotation 50 degrees   Left rotation 25 degrees    (Blank rows = not tested)  UPPER EXTREMITY ROM:  UE A/ROM is WFLs without pain  UPPER EXTREMITY MMT: 4+/5 bil UE strength, neck strength 4-/5 throughout   TODAY'S TREATMENT:                                                                                                                              DATE:  01/31/2023 Seated scapular Retraction x 10 5 sec holds Seated cervical retraction 2 x 10 Seated cervical sidebending 2 x10 Seated cervical SNAGS (Lt rotation) x 10 8 sec hold Supine chin tucks 2 x 10 Supine scapular retractions 2 x 10 Manual: Cervical PROM & upper trap stretching into Lt sidebending & Rt rotation   01/13/23 Review of HEP issued in prior plan of care-see below    Manual therapy: elongation to Rt neck with passive stretch   If treatment provided at initial evaluation, no treatment charged due to lack of authorization.        PATIENT EDUCATION:  Education details: Access Code: [email or web address] Person educated: Patient Education method: Explanation, Demonstration, and Handouts Education comprehension: verbalized understanding and returned demonstration  HOME EXERCISE PROGRAM: Access Code: [email or web address] URL: [email or web address] Date: 05/30/2022 Prepared by: Burnard  Exercises - Seated Scapular Retraction  - 5 x daily - 7 x weekly - 1 sets - 10 reps - 5 hold - Supine Scapular Retraction  - 3 x daily - 7 x weekly - 1 sets - 10 reps - 5 hold - Seated Correct Posture  - 1 x daily - 7 x weekly - 3 sets - 10 reps - Seated Cervical Retraction  - 1 x daily - 7 x weekly - 3 sets - 10 reps - Supine Chin Tuck  - 1 x daily - 7 x weekly - 3 sets - 10 reps - Seated Cervical Sidebending AROM  - 3 x daily -  7 x weekly - 1 sets - 10 reps - 5-10 hold  ASSESSMENT:  CLINICAL IMPRESSION: Today's treatment session focused on postural awareness & manual therapy technique for neck ROM. [name] responded favorably to neck PROM and verbalized mild soreness. He verbalized compliance with HEP. Patient required minimal verbal cues for form correction. Patient will benefit from skilled PT to address the below impairments and improve overall function.   OBJECTIVE IMPAIRMENTS: decreased activity tolerance, increased muscle spasms, impaired flexibility, postural dysfunction, and pain.   ACTIVITY LIMITATIONS: sitting, standing, and reach over head  PARTICIPATION LIMITATIONS: meal prep, driving, and community activity  PERSONAL FACTORS: 1-2 comorbidities: cancer on pallative care, chronic postural dysfunction  are also affecting patient's functional outcome.   REHAB POTENTIAL: Good  CLINICAL DECISION MAKING: Stable/uncomplicated  EVALUATION COMPLEXITY: Moderate   GOALS: Goals reviewed with patient? Yes  SHORT TERM GOALS: Target date: 02/10/2023      Be independent in initial  HEP Baseline:  Goal status: INITIAL  2.  Verbalize and demonstrate postural corrections to improve tolerance for neutral posture and reduced neck strain  Baseline:  Goal status:  INITIAL   LONG TERM GOALS: Target date: 03/10/2023    Be independent in advanced HEP Baseline:  Goal status: INITIAL  3.  Improve NDI to < or = to 7/50 Baseline: 15/50 Goal status: INITIAL  3.   Perform independent postural correction and hold this x 1-2 minutes  Baseline: needs visual feedback and can hold <30seconds  Goal status: INITIAL   4.   Report > or = to 40% reduction in neck pain due to reduced tension and strain in neck Baseline: constant strain with sitting upright  Goal status: INITIAL    PLAN:  PT FREQUENCY: 1x/week  PT DURATION: 8 weeks  PLANNED INTERVENTIONS: Therapeutic exercises, Therapeutic activity,  Neuromuscular re-education, Balance training, Gait training, Patient/Family education, Self Care, Joint mobilization, Aquatic Therapy, Dry Needling, Electrical stimulation, Spinal mobilization, Cryotherapy, Moist heat, Taping, Traction, Manual therapy, and Re-evaluation  PLAN FOR NEXT SESSION:Continue  work on postural reeducation, passive stretching, manual to address muscle tension, DN      Kristeen Sar, PT 01/31/23 8:50 AM [facility] [address] [place], [place] [address] Phone # [phone] Fax [phone]

## 2023-02-03 ENCOUNTER — Inpatient Hospital Stay: Payer: MEDICAID | Attending: Hematology and Oncology | Admitting: Nurse Practitioner

## 2023-02-03 ENCOUNTER — Encounter: Payer: Self-pay | Admitting: Nurse Practitioner

## 2023-02-03 ENCOUNTER — Ambulatory Visit: Payer: MEDICAID | Attending: Radiation Oncology

## 2023-02-03 VITALS — BP 117/80 | HR 68 | Temp 97.6°F | Resp 16 | Ht 71.0 in | Wt 100.1 lb

## 2023-02-03 DIAGNOSIS — C329 Malignant neoplasm of larynx, unspecified: Secondary | ICD-10-CM

## 2023-02-03 DIAGNOSIS — R5383 Other fatigue: Secondary | ICD-10-CM

## 2023-02-03 DIAGNOSIS — G893 Neoplasm related pain (acute) (chronic): Secondary | ICD-10-CM | POA: Diagnosis not present

## 2023-02-03 DIAGNOSIS — R630 Anorexia: Secondary | ICD-10-CM

## 2023-02-03 DIAGNOSIS — Z515 Encounter for palliative care: Secondary | ICD-10-CM | POA: Diagnosis not present

## 2023-02-03 DIAGNOSIS — R1312 Dysphagia, oropharyngeal phase: Secondary | ICD-10-CM | POA: Insufficient documentation

## 2023-02-03 DIAGNOSIS — R490 Dysphonia: Secondary | ICD-10-CM | POA: Diagnosis present

## 2023-02-03 MED ORDER — PREGABALIN 20 MG/ML PO SOLN: 40.0000 mg | Freq: Three times a day (TID) | ORAL | 1 refills | Status: DC

## 2023-02-03 NOTE — Progress Notes (Signed)
 Palliative Medicine [facility]  Telephone:([phone] Fax:([phone]   Name: David [name] Date: 02/03/2023 MRN: [id number]  DOB: 1975-11-03  Patient Care Team: Kandis Perkins, DO as PCP - General (Internal Medicine) [name], Delon CROME, RN as Oncology Nurse Navigator [name], Gerard LABOR, DO as Consulting Physician (Otolaryngology) Izell Domino, MD as Consulting Physician (Radiation Oncology) Loretha Ash, MD as Consulting Physician (Hematology and Oncology) Pickenpack-Cousar, Fannie SAILOR, NP as Nurse Practitioner (Nurse Practitioner)   REASON FOR CONSULTATION: David [name] is a 48 y.o. male with multiple medical problems including stage IV squamous cell carcinoma, right supraglottic mass, adenopathy, s/p initial concurrent chemoradiation, history of drug use currently followed by methadone  clinic, homelessness, and some malnutrition. He was recently hospitalized for several months at [facility] where he received treatment for fungal endocarditis and required emergent tracheostomy and PEG tube. During hospitalization CT of chest showed concerns for residual vs recurrent tumor. Palliative requested to re-engage for ongoing support and goals of care.   SOCIAL HISTORY:     reports that he has been smoking cigarettes. He has never used smokeless tobacco. He reports that he does not currently use alcohol after a past usage of about 12.0 standard drinks of alcohol per week. He reports that he does not currently use drugs after having used the following drugs: IV.  ADVANCE DIRECTIVES:  Patient reports he does not have advanced directives.  Would like to further discuss in the future.  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)    Pneumococcal bacteremia 06/09/2021   Pneumothorax    Left lung spontaneous pneumothorax at age 3 yr    S/P percutaneous endoscopic gastrostomy (PEG) tube placement (HCC) 06/24/2021    PAST SURGICAL HISTORY:  Past Surgical History:  Procedure Laterality Date   chest tubes Left    IR GASTROSTOMY TUBE MOD SED  06/18/2021   LAPAROSCOPIC INSERTION GASTROSTOMY TUBE N/A 06/20/2021   Procedure: LAPAROSCOPIC INSERTION GASTROSTOMY TUBE;  Surgeon: Signe Mitzie LABOR, MD;  Location: [facility] OR;  Service: General;  Laterality: N/A;  DOW PATIENT   TRACHEOSTOMY TUBE PLACEMENT N/A 06/09/2021   Procedure: AWAKE TRACHEOSTOMY;  Surgeon: Carlie Clark, MD;  Location: [facility] OR;  Service: ENT;  Laterality: N/A;      ALLERGIES:  has no known allergies.  MEDICATIONS:  Current Outpatient Medications  Medication Sig Dispense Refill   diazePAM  5 MG/5ML SOLN TAKE 2 MLS (2 MG TOTAL) BY MOUTH EVERY 8 (EIGHT) HOURS AS NEEDED FOR MUSCLE SPASMS (DYSPHAGIA). 100 mL 0   ELIQUIS  5 MG TABS tablet TAKE 1 TABLET BY MOUTH TWICE A DAY 60 tablet 0   fluconazole  (DIFLUCAN ) 200 MG tablet Take 800 mg by mouth daily.     Glecaprevir -Pibrentasvir  100-40 MG TABS Take 1 tablet by mouth daily.     lidocaine  (XYLOCAINE ) 2 % solution Use as directed 15 mLs in the mouth or throat every 3 (three) hours as needed for mouth pain. Mix 1part 2%  viscous lidocaine , 1part water . Swish and spit 30mL of total diluted mixture up to eight times a day, prn as needed for soreness 200 mL 2   midodrine  (PROAMATINE ) 5 MG tablet TAKE 1 TABLET (5 MG TOTAL) BY MOUTH 3 (THREE) TIMES DAILY WITH MEALS. 270 tablet 2   ondansetron   (ZOFRAN ) 4 MG tablet Take 1 tablet (4 mg total) by mouth every 8 (eight) hours as needed for nausea or vomiting. 30 tablet 1   Pregabalin  20 MG/ML SOLN Take 40 mg by mouth 3 (three) times daily. 120 mL 1   sertraline  (ZOLOFT ) 20 MG/ML concentrated solution Mix 1.25 mLs (25 mg total)  with 1/2 cup of water  and take by mouth daily. 60 mL 2   No current facility-administered medications for this visit.      Latest Ref Rng & Units 12/13/2022    2:10 PM 11/13/2022    3:40 PM 11/08/2022    8:59 AM  CBC  WBC 4.0 - 10.5 K/uL 4.8  4.0  7.7   Hemoglobin 13.0 - 17.0 g/dL 9.6  88.5  86.9   Hematocrit 39.0 - 52.0 % 31.2  35.6  40.8   Platelets 150 - 400 K/uL 108  160  249        Latest Ref Rng & Units 12/13/2022    2:10 PM 11/29/2022   10:00 AM 11/13/2022    3:40 PM  CMP  Glucose 70 - 99 mg/dL 872  92  49   BUN 6 - 20 mg/dL 16  12  11    Creatinine 0.61 - 1.24 mg/dL 9.24  9.14  9.15   Sodium 135 - 145 mmol/L 135  135  138   Potassium 3.5 - 5.1 mmol/L 3.4  4.0  3.7   Chloride 98 - 111 mmol/L 101  96  97   CO2 22 - 32 mmol/L 28  26  24    Calcium 8.9 - 10.3 mg/dL 8.7  9.1  9.0   Total Protein 6.5 - 8.1 g/dL  7.1  7.1   Total Bilirubin 0.3 - 1.2 mg/dL  0.3  <9.7   Alkaline Phos 38 - 126 U/L  67  81   AST 15 - 41 U/L  31  61   ALT 0 - 44 U/L  17  23     VITAL SIGNS: There were no vitals taken for this visit. There were no vitals filed for this visit.     Estimated body mass index is 12.66 kg/m as calculated from the following:   Height as of 01/07/23: 5' 11 (1.803 m).   Weight as of 01/07/23: 90 lb 12.8 oz (41.2 kg).  PERFORMANCE STATUS (ECOG) : 1 - Symptomatic but completely ambulatory  Assessment NAD, thin  RRR Normal breathing pattern, trach in place Right neck contracture, dependent edema Alert and oriented x3  Discussed the use of AI scribe software for clinical note transcription with the patient, who gave verbal consent to proceed.   IMPRESSION:  [name] presents  to clinic for follow-up. His mother is present. Patient continues to show signs of decline however he feels that his quality of life is sufficient. He is still at his mother's home for support. Actively involved with SLP/PT. States he has an upcoming appointment with [facility] provider. [name] denies nausea, vomiting, constipation, or diarrhea.   The patient's significant concern is his nasal discharge. States food will come out of his nose at times in addition to liquids. His head is contracted to right  side and chin to chest.  Recent MBS performed. Despite nasal drainage and regurgitation, he feels his appetite is good and much improved. His weight is up to 100lbs from 88 lbs 11/27.    His pain is well controlled in addition to anxiety. Continues to receive support from Methadone  clinic. Tolerating pregabalin  and diazepam . No changes to regimen.   The patient expressed a need for assistance with bathing and assistance due to occasional fatigue and nutritional needs. He has been advised to register for home health or CAPS through his Medicaid worker.   All questions answered and support provided.  Assessment & Plan  Nasal Drainage Recurrence of symptoms after eating or drinking. Denies cough. Tracheostomy in place.  -Encouraged ongoing support form SLP and medical providers.  -Education provided on safe eating and drinking to decrease risk of aspiration and ongoing drainage.   Blood Pressure Concerns about an old prescription for blood pressure medication, despite no current diagnosis of hypertension. Blood pressure today is 117/80. -Advised to follow-up with medical provider on use of midodrine .   Weight Gain Noted increase in weight, due to dietary changes. High-caloric intake -Continue current diet and monitor weight.  Home Health Assistance Difficulty with bathing, laundry, and cleaning due to physical limitations. -Contact Medicaid worker to register for CAPS or home health  services.  Speech Therapy Currently attending sessions once every week. Patient states this is being moved to every 2 weeks.  -Continue current speech therapy schedule.  Physical Therapy Starting new therapy for neck. -Continue with planned physical therapy.  Follow-up Upcoming appointment with Dr. Lebron on 02/18/2023. -Discuss possibility of using a patch for nasal drainage with Dr. Lebron. -I will plan to see patient back in 6 weeks. Sooner if needed.    Any controlled substances utilized were prescribed in the context of palliative care. PDMP has been reviewed.    Visit consisted of counseling and education dealing with the complex and emotionally intense issues of symptom management and palliative care in the setting of serious and potentially life-threatening illness.  Levon Borer, AGPCNP-BC  Palliative Medicine Team/[facility]

## 2023-02-03 NOTE — Therapy (Signed)
 OUTPATIENT SPEECH LANGUAGE PATHOLOGY SWALLOW TREATMENT   Patient Name: David [name] MRN: [id number] DOB:05-21-1975, 48 y.o., male Today's Date: 02/03/2023  PCP: Kandis Perkins, MD REFERRING PROVIDER: Izell Domino, MD  END OF SESSION:  End of Session - 02/03/23 0811     Visit Number 4    Number of Visits 9    Date for SLP Re-Evaluation 03/14/23    SLP Start Time 0809    Activity Tolerance Patient tolerated treatment well              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)    Pneumococcal bacteremia 06/09/2021   Pneumothorax    Left lung spontaneous pneumothorax at age 86 yr    S/P percutaneous endoscopic gastrostomy (PEG) tube placement (HCC) 06/24/2021   Past Surgical History:  Procedure Laterality Date   chest tubes Left    IR GASTROSTOMY TUBE MOD SED  06/18/2021   LAPAROSCOPIC INSERTION GASTROSTOMY TUBE N/A 06/20/2021   Procedure: LAPAROSCOPIC INSERTION GASTROSTOMY TUBE;  Surgeon: Signe Mitzie LABOR, MD;  Location: [facility] OR;  Service: General;  Laterality: N/A;  DOW PATIENT   TRACHEOSTOMY TUBE PLACEMENT N/A 06/09/2021   Procedure: AWAKE TRACHEOSTOMY;  Surgeon: Carlie Clark, MD;  Location: [facility] OR;  Service: ENT;  Laterality: N/A;   Patient Active Problem List   Diagnosis Date Noted   FTT (failure to thrive) in adult 12/31/2022   Dysphagia, unspecified 12/31/2022   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 12/31/2022   Hepatitis A immune 11/14/2022   End of life care 10/23/2022    Acute purulent bronchitis 10/23/2022   Hepatitis C 10/09/2022   Left leg pain 10/09/2022   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   History of pancytopenia 09/12/2022   Torticollis 07/04/2022   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   Defective dental restoration 12/31/2021   Chronic periodontitis 12/31/2021   Impacted third molar tooth 12/31/2021   Irritant contact dermatitis associated with digestive stoma 12/25/2021   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   Continuous tobacco abuse 11/21/2021   Dermatitis associated with moisture 10/16/2021   Back pain 08/16/2021   Candidal endocarditis    History of radiation to head and neck region 07/11/2021   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   Dysgeusia 07/11/2021   Pressure injury of skin 06/17/2021   Tracheostomy status (HCC)    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   Heart failure with reduced ejection fraction (HCC) 04/28/2021   Anxiety 04/28/2021   Elevated troponin 04/01/2021   Elevated transaminase level 04/01/2021   Weight loss, unintentional 01/15/2021   Protein-calorie malnutrition, severe (HCC) 01/10/2021   Hypotension 01/08/2021   Chemotherapy induced nausea and vomiting 01/01/2021   Encounter for dental examination 12/13/2020   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   Teeth missing 12/04/2020   Radiation caries 12/04/2020   Retained tooth root 12/04/2020   Chronic apical periodontitis 12/04/2020   Accretions on teeth 12/04/2020    ONSET DATE: 2023   REFERRING DIAG:  C32.9 (ICD-10-CM) -  Laryngeal cancer (HCC)    THERAPY DIAG:  Dysphagia, oropharyngeal phase  Hoarseness of voice  Rationale for Evaluation and Treatment: Rehabilitation  SUBJECTIVE:   SUBJECTIVE STATEMENT: I had biscuits and gravy and grilled chicken sandwich.  Pt accompanied by: self  PERTINENT HISTORY: Pt well known to this clinician from previous plans of care. HPI: s/p chemo  and XRT. Has had slow but steady overall decline over the summer. Specifically significant adult FTT, torticollis with right ear touching right shoulder, limited ROM of neck and dependent lymphedema right face, mild stage 1 pressure/breakdown to left of trach stoma. He is followed by Jeralyn Banner at trach clinic, most recently 12/31/22; trach changed to [place] Bivona size 7. Significant air trapping with capping valve, not a candidate for decannulation. Hx trach 06/09/21, PEG 06/20/21, subsequently fell out per his report. Most recent MBS 07/25/21: mild pharyngeal residuals, head turn to right, cough/re-swallow helped with airway protection. Aspiration of thins. Regular/thins recommended. OP SLP from 9-12/23. Pt reports worsening dysphagia with coughing, occasional nasal regurgitation. MBS completed 01/10/23, results below.   PAIN:  Are you having pain? No  FALLS: Has patient fallen in last 6 months?  Yes; one  PATIENT GOALS: get this swallowing better  OBJECTIVE:  Note: Objective measures were completed at Evaluation unless otherwise noted. 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  Quality of imaging was compromised by shoulder/neck positioning and it was difficult to visualize anatomical markers. There were no incidents of nasal regurgitation on today's study (reported to be happening more frequently). There was minimal mobility of the larynx and incomplete laryngeal closure, leading to intermittent aspiration of thin and nectar thick liquids, occurring before and after the swallow response. There was frequent tongue pumping and effort to propel material from mouth through pharynx, leaving residuals in posterior oral cavity with multiple f/u swallows  needed to move bolus into throat. There was minimal pharyngeal residue post-swallow.    Objective recommended compensations:  Recommendations: PO diet PO Diet Recommendation: Regular; Thin liquids (Level 0) Medication Administration: Whole meds with puree Supervision: Patient able to self-feed Swallowing strategies  : Place PMSV during PO intake Oral care recommendations: Oral care QID (4x/day)  PATIENT REPORTED OUTCOME MEASURES (PROM): EAT-10: 7/40, with higher scores indicating that swallowing is negatively affecting pt's QOL.    TODAY'S TREATMENT:                                                                                                                                         DATE:  02/03/23: EAT-10 completed today. See above. Pt's neck positioning does not appear improved from previous session. Talked with pt about/reiterated importance of 20 reps of each exercise, as pt reported he was  completing approx 10 each/day. As he said, I'm doing 10 plus all my eating, SLP reminded [name] that exercises were in addition to any swallowing that he would do with eating. Procedure was independent.  Pt ate a fig bar and drank water  with a straw. Wet voice heard approx 25% of the time and pt req'd cues initially to swallow x2-3 with each bite or sip. SLP stressed to pt the need to follow precautions which included multiple swallows, considering eating 4-5 smaller meals per day rather than 2-3 larger meals in order to conserve energy and make eating more safe, and if experiencing nasal regurgitation (not seen on MBS) to take smaller sips and swallow hard. SLP told pt that he is decreasing his RISK for aspiration with completion of the exercises and if he is not completing HEP as directed his is likely not decreasing his risk for aspiration . He voiced understanding. SLP suggested pt cont'd to be seen once a week, [name] asked to be seen once every two weeks.  01/27/23: SLP provided pt with fig bar and  water  and pt with hydrophonic voice after 3 bite/sip combinations. SLP told pt to swallow x3 for each bite or sip and he req'd usual min A for this faded to occasional min A. Frequency of hydrophonic voice decr'd after this was suggested by SLP. Pt cont to eat softer foods that don't require as much chewing. SLP again provided pt with overt s/sx aspiration PNA. SLP reiterated pt keep as active as possible with regular walking/exercise to reduce risk of aspiration PNA. SLP also reminded pt of x4/day thorough oral care. Pt told SLP he was performing oral care x3/day regularly and x4 sometimes.  With HEP, pt req'd cues with sequencing of Supraglottic, but was independent by session end. Other exercises were completed with independence. Pt stated he was getting x10/day of each but stated he was working his way to x20/day for each. SLP encouraged him to stay consistent and he has the best chance for this to occur.   01/20/23: Pt needs EAT-10 next session. He ate biscuits and gravy this morning for breakfast. SLP reiterated soft foods to pt. Pt enters today with visibly soiled shirt with what appears to be dried food or dried phlegm, and a blood stain (small) on jacket. He completed HEP exercises with independence. He has completed all exercises but not 20/day like you said. SLP encouraged pt he may not be able to achieve 20 each exercise, currently, but to do as many as he can at this time and he should be able to incr reps as time goes on. He acknowledged understanding.  Pt removed trach during session today and SLP expressed concern about this. Pt assured SLP he is performing trach cleaning and care each day, and good oral hygiene as well.   01/14/23 (eval): see education below  PATIENT EDUCATION: Education details: See today's treatment above Person educated: Patient Education method: Explanation, Demonstration, Verbal cues, and Handouts Education comprehension: verbalized understanding, returned  demonstration, verbal cues required, and needs further education   ASSESSMENT:  CLINICAL IMPRESSION: Patient is a 48 y.o. M who was seen today for treatment of swallow after MBS 01/10/23. Pt is currently being seen for torticollis by PT. See today's treatment for more details of today's session. Success of HEP on pt's overall swallow safety at this time is unknown but SLP will educate pt about and cont to encourage pt to complete HEP and follow all swallow safety recommendations.  OBJECTIVE IMPAIRMENTS:  include voice disorder and dysphagia. These impairments are limiting patient from effectively communicating at home and in community and safety when swallowing. Factors affecting potential to achieve goals and functional outcome are cooperation/participation level, medical prognosis, previous level of function, severity of impairments, and financial resources. Patient will benefit from skilled SLP services to address above impairments and improve overall function.  REHAB POTENTIAL: Fair given above factors   GOALS: Goals reviewed with patient? Yes  SHORT TERM GOALS: Target date: 02/14/23  Pt will complete HEP with rare min A Baseline: Goal status: INITIAL  2.  Pt will follow swallow strategies from MBS with POs x2 visits Baseline:  Goal status: INITIAL  3.  Pt will tell SLP 3 overt s/sx aspiration PNA with modified independence in 2 sessions Baseline: 01/20/23 Goal status: met  4.  Pt will tell SLP rationale for thorough oral care in 2 sessions Baseline:  Goal status: INITIAL   LONG TERM GOALS: Target date: 03/14/23  Pt will improve PROM compared to initial administration Baseline:  Goal status: INITIAL  2.  Pt will follow swallow strategies in 1 session after 02/14/23 Baseline:  Goal status: INITIAL  3.  Pt will tell SLP 3 overt s/sx aspiration PNA in 2 sessions Baseline:  Goal status: INITIAL  4.  Pt will tell SLP rationale for thorough oral care after  02/14/23 Baseline:  Goal status: INITIAL   PLAN:  SLP FREQUENCY: 1x/week  SLP DURATION: 8 weeks  PLANNED INTERVENTIONS: 92526 Treatment of swallowing function, Aspiration precaution training, Pharyngeal strengthening exercises, Diet toleration management , Environmental controls, Trials of upgraded texture/liquids, Internal/external aids, SLP instruction and feedback, Compensatory strategies, and Patient/family education    [facility], CCC-SLP 02/03/2023, 8:20 AM For all possible CPT codes, reference the Planned Interventions line above.     Check all conditions that are expected to impact treatment: {Conditions expected to impact treatment:Respiratory disorders, Contractures, spasticity or fracture relevant to requested treatment, and Social determinants of health   If treatment provided at initial evaluation, no treatment charged due to lack of authorization.

## 2023-02-05 ENCOUNTER — Ambulatory Visit: Payer: MEDICAID

## 2023-02-05 DIAGNOSIS — M542 Cervicalgia: Secondary | ICD-10-CM | POA: Diagnosis not present

## 2023-02-05 DIAGNOSIS — M6281 Muscle weakness (generalized): Secondary | ICD-10-CM

## 2023-02-05 DIAGNOSIS — R293 Abnormal posture: Secondary | ICD-10-CM

## 2023-02-05 NOTE — Therapy (Signed)
 OUTPATIENT PHYSICAL THERAPY CERVICAL TREATMENT   Patient Name: David [name] MRN: [id number] DOB:03-04-1975, 48 y.o., male Today's Date: 02/05/2023  END OF SESSION:  PT End of Session - 02/05/23 0858     Visit Number 3    Date for PT Re-Evaluation 03/10/23    Authorization Type trillium approved 8 visits 01/20/2023-03/10/2023 [id number]    Authorization Time Period 01/20/2023-03/10/2023    Authorization - Visit Number 2    Authorization - Number of Visits 8    PT Start Time 0816    PT Stop Time 0901    PT Time Calculation (min) 45 min    Activity Tolerance Patient tolerated treatment well    Behavior During Therapy [facility] for tasks assessed/performed                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)    Pneumococcal bacteremia 06/09/2021   Pneumothorax    Left lung spontaneous pneumothorax at age 54 yr    S/P percutaneous endoscopic gastrostomy (PEG) tube placement (HCC) 06/24/2021   Past Surgical History:  Procedure Laterality Date   chest tubes Left    IR GASTROSTOMY TUBE MOD SED  06/18/2021   LAPAROSCOPIC INSERTION GASTROSTOMY TUBE N/A 06/20/2021   Procedure: LAPAROSCOPIC INSERTION GASTROSTOMY TUBE;  Surgeon: Signe Mitzie LABOR, MD;  Location: [facility] OR;  Service: General;  Laterality: N/A;  DOW PATIENT   TRACHEOSTOMY TUBE PLACEMENT N/A 06/09/2021   Procedure: AWAKE TRACHEOSTOMY;  Surgeon: Carlie Clark, MD;  Location: [facility] OR;  Service: ENT;  Laterality: N/A;    Patient Active Problem List   Diagnosis Date Noted   FTT (failure to thrive) in adult 12/31/2022   Dysphagia, unspecified 12/31/2022   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 12/31/2022   Hepatitis A immune 11/14/2022   End of life care 10/23/2022   Acute purulent bronchitis 10/23/2022   Hepatitis C 10/09/2022   Left leg pain 10/09/2022   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   History of pancytopenia 09/12/2022   Torticollis 07/04/2022   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   Defective dental restoration 12/31/2021   Chronic periodontitis 12/31/2021   Impacted third molar tooth 12/31/2021   Irritant contact dermatitis associated with digestive stoma 12/25/2021   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   Continuous tobacco abuse 11/21/2021   Dermatitis associated with moisture 10/16/2021   Back pain 08/16/2021   Candidal endocarditis    History of radiation to head and neck region 07/11/2021   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   Dysgeusia 07/11/2021   Pressure injury of skin 06/17/2021   Tracheostomy status (HCC)    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   Heart failure with reduced ejection fraction (HCC) 04/28/2021   Anxiety 04/28/2021   Elevated troponin 04/01/2021   Elevated transaminase level 04/01/2021   Weight loss, unintentional 01/15/2021   Protein-calorie malnutrition, severe (HCC) 01/10/2021   Hypotension 01/08/2021   Chemotherapy induced nausea and vomiting 01/01/2021   Encounter for dental examination 12/13/2020  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   Teeth missing 12/04/2020   Radiation caries 12/04/2020   Retained tooth root 12/04/2020   Chronic apical periodontitis 12/04/2020   Accretions on teeth 12/04/2020    PCP: Griffith Porter, MD   REFERRING PROVIDER: [place] Fannie SAILOR, NP   REFERRING DIAG: Diagnosis Information  Diagnosis  C32.9 (ICD-10-CM) - Laryngeal cancer (HCC)     THERAPY DIAG:  Cervicalgia  Abnormal posture  Muscle weakness (generalized)  Rationale for Evaluation and Treatment: Rehabilitation  ONSET DATE: 1.5 year history (trach placement)  SUBJECTIVE:                                                                                                                                                                                                         SUBJECTIVE STATEMENT: I have been working on my exercises at home.  My neck feel looser.    From Eval: Pt presents to PT with Rt sided neck tension and shortened tissue secondary to radiation.  Pt has difficulty holding up his head due to pain, postural weakness, and shortened tissue on the Rt due to radiation.  Pt was discharged from PT in August due to clot in Rt jugular vein.  Clot is now resolved.  He reports that he has been stretching and working on posture at home.   Hand dominance: Right  PERTINENT HISTORY:  Tracheostomy, radiation to neck, malignant neoplasm of supraglottis, thrombosis of Rt internal jugular vein, torticollis, heart failure  PAIN: 02/05/2023 Are you having pain? Yes: NPRS scale: 3/10 Pain location: Rt neck Pain description: stiff, throbbing Aggravating factors: overstretching, trying to stretch  Relieving factors: massaging, medication  PRECAUTIONS: Other: cancer, trach  WEIGHT BEARING RESTRICTIONS: No  FALLS:  Has patient fallen in last 6 months? No  LIVING ENVIRONMENT: Lives with: lives alone Lives in: House/apartment   PLOF: Independent, walking to mailbox  PATIENT GOALS: reduce neck pain, improve mobility   NEXT MD VISIT:  January 2025  OBJECTIVE:   DIAGNOSTIC FINDINGS:  None   PATIENT SURVEYS:  NDI 15/50  COGNITION: Overall cognitive status: Within functional limits for tasks assessed  SENSATION: WFL  POSTURE: rounded shoulders, forward head, increased thoracic kyphosis, and flexed trunk   PALPATION: Significant tension and reduced  muscle length in bil neck. Fixed cervical spine segments with minimal mobility allowed.    CERVICAL ROM:   Active ROM A/PROM (deg) 01/13/23  Flexion Rests in 30 degrees flexion  Extension   Right lateral flexion Rests in 30 degrees  Left lateral flexion 10 degrees Rt lateral flexion  Right rotation  50 degrees   Left rotation 25 degrees    (Blank rows = not tested)  UPPER EXTREMITY ROM:  UE A/ROM is WFLs without pain  UPPER EXTREMITY MMT: 4+/5 bil UE strength, neck strength 4-/5 throughout   TODAY'S TREATMENT:                                                                                                                              DATE:  02/05/2023 Seated scapular Retraction x 10 5 sec holds Arm Bike: Level 1 x 4 minutes-1 min forward, rest, 1 min reverse, rest and repeat.-focus on keeping head upright Standing red band: shoulder extension 2x10 Seated red band rows: 2x10 Trigger Point Dry Needling  Subsequent Treatment: Instructions reviewed, if requested by the patient, prior to subsequent dry needling treatment.   Patient Verbal Consent Given: Yes Education Handout Provided: Yes Muscles Treated: bil upper traps, bil cervical paraspinals  Electrical Stimulation Performed: No Treatment Response/Outcome: improved tissue mobility and twitch response    Manual: elongation and release to Rt>Lt neck after DN 01/31/2023 Seated scapular Retraction x 10 5 sec holds Seated cervical retraction 2 x 10 Seated cervical sidebending 2 x10 Seated cervical SNAGS (Lt rotation) x 10 8 sec hold Supine chin tucks 2 x 10 Supine scapular retractions 2 x 10 Manual: Cervical PROM & upper trap stretching into Lt sidebending & Rt rotation   01/13/23 Review of HEP issued in prior plan of care-see below    Manual therapy: elongation to Rt neck with passive stretch   If treatment provided at initial evaluation, no treatment charged due to lack of authorization.        PATIENT EDUCATION:   Education details: Access Code: [email or web address] Person educated: Patient Education method: Explanation, Demonstration, and Handouts Education comprehension: verbalized understanding and returned demonstration  HOME EXERCISE PROGRAM: Access Code: [email or web address] URL: [email or web address] Date: 05/30/2022 Prepared by: Burnard  Exercises - Seated Scapular Retraction  - 5 x daily - 7 x weekly - 1 sets - 10 reps - 5 hold - Supine Scapular Retraction  - 3 x daily - 7 x weekly - 1 sets - 10 reps - 5 hold - Seated Correct Posture  - 1 x daily - 7 x weekly - 3 sets - 10 reps - Seated Cervical Retraction  - 1 x daily - 7 x weekly - 3 sets - 10 reps - Supine Chin Tuck  - 1 x daily - 7 x weekly - 3 sets - 10 reps - Seated Cervical Sidebending AROM  - 3 x daily - 7 x weekly - 1 sets - 10 reps - 5-10 hold  ASSESSMENT:  CLINICAL IMPRESSION: Pt was late for appt due to transportation. Pt did well with advancement of activity with emphasis on postural strength and upright gaze. Pt had good response to DN with improved tissue mobility after. He verbalized compliance with HEP. Patient required minimal verbal cues for form correction. Patient will benefit from skilled PT  to address the below impairments and improve overall function.   OBJECTIVE IMPAIRMENTS: decreased activity tolerance, increased muscle spasms, impaired flexibility, postural dysfunction, and pain.   ACTIVITY LIMITATIONS: sitting, standing, and reach over head  PARTICIPATION LIMITATIONS: meal prep, driving, and community activity  PERSONAL FACTORS: 1-2 comorbidities: cancer on pallative care, chronic postural dysfunction  are also affecting patient's functional outcome.   REHAB POTENTIAL: Good  CLINICAL DECISION MAKING: Stable/uncomplicated  EVALUATION COMPLEXITY: Moderate   GOALS: Goals reviewed with patient? Yes  SHORT TERM GOALS: Target date: 02/10/2023      Be independent in initial  HEP Baseline:  Goal status:  INITIAL  2.  Verbalize and demonstrate postural corrections to improve tolerance for neutral posture and reduced neck strain  Baseline:  Goal status: INITIAL   LONG TERM GOALS: Target date: 03/10/2023    Be independent in advanced HEP Baseline:  Goal status: INITIAL  3.  Improve NDI to < or = to 7/50 Baseline: 15/50 Goal status: INITIAL  3.   Perform independent postural correction and hold this x 1-2 minutes  Baseline: needs visual feedback and can hold <30seconds  Goal status: INITIAL   4.   Report > or = to 40% reduction in neck pain due to reduced tension and strain in neck Baseline: constant strain with sitting upright  Goal status: INITIAL    PLAN:  PT FREQUENCY: 1x/week  PT DURATION: 8 weeks  PLANNED INTERVENTIONS: Therapeutic exercises, Therapeutic activity, Neuromuscular re-education, Balance training, Gait training, Patient/Family education, Self Care, Joint mobilization, Aquatic Therapy, Dry Needling, Electrical stimulation, Spinal mobilization, Cryotherapy, Moist heat, Taping, Traction, Manual therapy, and Re-evaluation  PLAN FOR NEXT SESSION:Continue work on postural reeducation, passive stretching, manual to address muscle tension, DN    Burnard Joy, PT 02/05/23 9:05 AM  [facility] [address], [address] [place], [place] [address] Phone # [phone] Fax [phone]

## 2023-02-07 ENCOUNTER — Other Ambulatory Visit: Payer: Self-pay | Admitting: Student

## 2023-02-10 ENCOUNTER — Ambulatory Visit: Payer: MEDICAID | Admitting: Student

## 2023-02-10 VITALS — BP 112/68 | HR 92 | Temp 99.3°F | Ht 70.0 in | Wt 99.7 lb

## 2023-02-10 DIAGNOSIS — E038 Other specified hypothyroidism: Secondary | ICD-10-CM

## 2023-02-10 NOTE — Assessment & Plan Note (Signed)
 I spoke on the phone with the patient and his mother 2 months ago to discuss the results of his thyroid  testing.  I informed the patient's mother that he had subclinical hypothyroidism at that time.  The patient's mother stated that David [name] has been fatigued and sleeping excessively.  I asked the patient and mother to schedule an appointment at the clinic to discuss his symptoms and possible treatment for subclinical hypothyroidism due to the symptoms present.  At his appointment today, the patient denied fatigue, sleeping excessively, or any other symptoms of hypothyroidism. He reported feeling well and denied any acute complaints.  Plan: -Repeat TSH in one year -No medication management at this time

## 2023-02-10 NOTE — Progress Notes (Signed)
 Established Patient Office Visit  Subjective   Patient ID: David [name], male    DOB: 11/28/1975  Age: 48 y.o. MRN: [id number]  Chief Complaint  Patient presents with   Results    Discuss thyroid  results and/or treatment     David [name] is a 48 year old male who presents to discuss laboratory results.  I spoke on the phone with the patient and his mother 2 months ago to discuss the results of his thyroid  testing.  I informed the patient's mother that he had subclinical hypothyroidism at that time.  The patient's mother stated that David [name] has been fatigued and sleeping excessively.  I asked the patient and mother to schedule an appointment at the clinic to discuss his symptoms and possible treatment for subclinical hypothyroidism due to the symptoms present.  At his appointment today, the patient denied fatigue, sleeping excessively, or any other symptoms of hypothyroidism. He reported feeling well and denied any acute complaints.    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)    Pneumococcal bacteremia 06/09/2021   Pneumothorax    Left lung spontaneous pneumothorax at age 88 yr    S/P percutaneous endoscopic gastrostomy (PEG) tube placement (HCC) 06/24/2021      Objective:     BP 112/68 (BP Location: Right Arm, Patient Position: Sitting, Cuff Size: Normal)   Pulse 92   Temp 99.3 F (37.4 C) (Oral)   Ht 5' 10 (1.778 m)   Wt 99 lb 11.2 oz (45.2 kg)   SpO2 99%   BMI  14.31 kg/m  BP Readings from Last 3 Encounters:  02/10/23 112/68  02/03/23 117/80  01/24/23 106/76   Wt Readings from Last 3 Encounters:  02/10/23 99 lb 11.2 oz (45.2 kg)  02/03/23 100 lb 1.6 oz (45.4 kg)  01/07/23 90 lb 12.8 oz (41.2 kg)      Physical Exam Constitutional:      General: He is not in acute distress.    Appearance: He is not toxic-appearing or diaphoretic.  HENT:     Head:     Comments: Chronic R facial erythema and swelling present.  Cardiovascular:     Rate and Rhythm: Normal rate and regular rhythm.     Heart sounds: No murmur heard. Pulmonary:     Effort: Pulmonary effort is normal. No respiratory distress.     Breath sounds: Normal breath sounds. No stridor. No wheezing, rhonchi or rales.  Skin:    General: Skin is warm and dry.  Neurological:     Mental Status: He is alert.  Psychiatric:        Mood and Affect: Mood normal.      No results found for any visits on 02/10/23.  Last thyroid  functions Lab Results  Component Value Date   TSH 6.419 (H) 12/20/2022      The ASCVD Risk score (Arnett DK, et al., 2019) failed to calculate for the following reasons:   Risk score cannot be calculated because patient has a medical  history suggesting prior/existing ASCVD    Assessment & Plan:   Problem List Items Addressed This Visit       Endocrine   Subclinical hypothyroidism - Primary    I spoke on the phone with the patient and his mother 2 months ago to discuss the results of his thyroid  testing.  I informed the patient's mother that he had subclinical hypothyroidism at that time.  The patient's mother stated that David [name] has been fatigued and sleeping excessively.  I asked the patient and mother to schedule an appointment at the clinic to discuss his symptoms and possible treatment for subclinical hypothyroidism due to the symptoms present.  At his appointment today, the patient denied fatigue, sleeping excessively, or any other symptoms of  hypothyroidism. He reported feeling well and denied any acute complaints.  Plan: -Repeat TSH in one year -No medication management at this time         Return in about 6 months (around 08/10/2023) for General Check Up.    Damien Lease, DO

## 2023-02-10 NOTE — Patient Instructions (Signed)
 Thank you, [name] for allowing us  to provide your care today. Today we discussed your lab results .    I have ordered the following labs for you:  Lab Orders  No laboratory test(s) ordered today     Tests ordered today:  N/A  Referrals ordered today:   Referral Orders  No referral(s) requested today     I have ordered the following medication/changed the following medications:   Stop the following medications: There are no discontinued medications.   Start the following medications: No orders of the defined types were placed in this encounter.    Follow up: 6 months     Should you have any questions or concerns please call the internal medicine clinic at [phone].     Damien Lease, D.O. [facility]

## 2023-02-11 ENCOUNTER — Ambulatory Visit: Payer: MEDICAID

## 2023-02-11 DIAGNOSIS — R293 Abnormal posture: Secondary | ICD-10-CM

## 2023-02-11 DIAGNOSIS — R1312 Dysphagia, oropharyngeal phase: Secondary | ICD-10-CM | POA: Diagnosis not present

## 2023-02-11 DIAGNOSIS — R490 Dysphonia: Secondary | ICD-10-CM

## 2023-02-11 DIAGNOSIS — M542 Cervicalgia: Secondary | ICD-10-CM

## 2023-02-11 DIAGNOSIS — M6281 Muscle weakness (generalized): Secondary | ICD-10-CM

## 2023-02-11 NOTE — Therapy (Signed)
 OUTPATIENT SPEECH LANGUAGE PATHOLOGY SWALLOW TREATMENT   Patient Name: David [name] MRN: [id number] DOB:1975-02-06, 48 y.o., male Today's Date: 02/11/2023  PCP: David Perkins, MD REFERRING PROVIDER: Izell Domino, MD  END OF SESSION:  End of Session - 02/11/23 0927     Visit Number 5    Number of Visits 9    Date for SLP Re-Evaluation 03/14/23    SLP Start Time 0850    SLP Stop Time  0922    SLP Time Calculation (min) 32 min    Activity Tolerance Patient tolerated treatment well               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)    Pneumococcal bacteremia 06/09/2021   Pneumothorax    Left lung spontaneous pneumothorax at age 83 yr    S/P percutaneous endoscopic gastrostomy (PEG) tube placement (HCC) 06/24/2021   Past Surgical History:  Procedure Laterality Date   chest tubes Left    IR GASTROSTOMY TUBE MOD SED  06/18/2021   LAPAROSCOPIC INSERTION GASTROSTOMY TUBE N/A 06/20/2021   Procedure: LAPAROSCOPIC INSERTION GASTROSTOMY TUBE;  Surgeon: David Mitzie LABOR, MD;  Location: [facility] OR;  Service: General;  Laterality: N/A;  DOW PATIENT   TRACHEOSTOMY TUBE PLACEMENT N/A 06/09/2021   Procedure: AWAKE TRACHEOSTOMY;  Surgeon: David Clark, MD;  Location: [facility] OR;  Service: ENT;  Laterality: N/A;   Patient Active Problem List   Diagnosis Date Noted   Subclinical hypothyroidism 02/10/2023   FTT (failure to thrive) in adult 12/31/2022   Dysphagia, unspecified 12/31/2022    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 12/31/2022   Hepatitis A immune 11/14/2022   End of life care 10/23/2022   Acute purulent bronchitis 10/23/2022   Hepatitis C 10/09/2022   Left leg pain 10/09/2022   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   History of pancytopenia 09/12/2022   Torticollis 07/04/2022   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   Defective dental restoration 12/31/2021   Chronic periodontitis 12/31/2021   Impacted third molar tooth 12/31/2021   Irritant contact dermatitis associated with digestive stoma 12/25/2021   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   Continuous tobacco abuse 11/21/2021   Dermatitis associated with moisture 10/16/2021   Back pain 08/16/2021   Candidal endocarditis    History of radiation to head and neck region 07/11/2021   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   Dysgeusia 07/11/2021   Pressure injury of skin 06/17/2021   Tracheostomy status (HCC)    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   Heart failure with reduced ejection fraction (HCC) 04/28/2021   Anxiety 04/28/2021   Elevated troponin 04/01/2021   Elevated transaminase level 04/01/2021   Weight loss, unintentional 01/15/2021   Protein-calorie malnutrition, severe (HCC) 01/10/2021   Hypotension 01/08/2021   Chemotherapy induced nausea and vomiting 01/01/2021   Encounter for dental examination 12/13/2020   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   Teeth missing 12/04/2020   Radiation caries 12/04/2020   Retained tooth root 12/04/2020  Chronic apical periodontitis 12/04/2020   Accretions on teeth 12/04/2020    ONSET DATE: 2023   REFERRING DIAG:  C32.9 (ICD-10-CM) - Laryngeal cancer (HCC)    THERAPY DIAG:  Dysphagia, oropharyngeal phase  Hoarseness of voice  Rationale for Evaluation and Treatment: Rehabilitation  SUBJECTIVE:   SUBJECTIVE STATEMENT: I had go about modifying it (trach). It wasn't meant to have a  canula.  Pt accompanied by: self  PERTINENT HISTORY: Pt well known to this clinician from previous plans of care. HPI: s/p chemo and XRT. Has had slow but steady overall decline over the summer. Specifically significant adult FTT, torticollis with right ear touching right shoulder, limited ROM of neck and dependent lymphedema right face, mild stage 1 pressure/breakdown to left of trach stoma. He is followed by David [name] at trach clinic, most recently 12/31/22; trach changed to [place] size 7. Significant air trapping with capping valve, not a candidate for decannulation. Hx trach 06/09/21, PEG 06/20/21, subsequently fell out per his report. Most recent MBS 07/25/21: mild pharyngeal residuals, head turn to right, cough/re-swallow helped with airway protection. Aspiration of thins. Regular/thins recommended. OP SLP from 9-12/23. Pt reports worsening dysphagia with coughing, occasional nasal regurgitation. MBS completed 01/10/23, results below.   PAIN:  Are you having pain? No  FALLS: Has patient fallen in last 6 months?  Yes; one  PATIENT GOALS: get this swallowing better  OBJECTIVE:  Note: Objective measures were completed at Evaluation unless otherwise noted. 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  Quality of imaging was compromised by shoulder/neck positioning and it was difficult to visualize anatomical markers. There were no incidents of nasal regurgitation on today's study (reported to be happening more frequently). There was minimal mobility of the larynx and incomplete laryngeal closure, leading to intermittent aspiration of thin and nectar thick liquids, occurring before and after the swallow response. There was frequent tongue  pumping and effort to propel material from mouth through pharynx, leaving residuals in posterior oral cavity with multiple f/u swallows needed to move bolus into throat. There was minimal pharyngeal residue post-swallow.    Objective recommended compensations:  Recommendations: PO diet PO Diet Recommendation: Regular; Thin liquids (Level 0) Medication Administration: Whole meds with puree Supervision: Patient able to self-feed Swallowing strategies  : Place PMSV during PO intake Oral care recommendations: Oral care QID (4x/day)  PATIENT REPORTED OUTCOME MEASURES (PROM): EAT-10: 7/40, with higher scores indicating that swallowing is negatively affecting pt's QOL.    TODAY'S TREATMENT:                                                                                                                                         DATE:  02/11/23: I hope  the cancer hasn't come back. I have some kind of a scan and a biopsy (on 02/18/23). Pt entered with a clear voice. Pt performed HEP with rare min A (tongue protrusion on [place]). [name] again stated he was performing 10 reps per day for each exercise of the HEP; SLP reminded pt again about 20 reps minimum of each exercise, and provided rationale for this. He ate fig bar and drank water  today without spontaneously following precautions. After SLP reminded pt of precautions he followed them. No overt s/sx of aspiration today with POs; pt's voice remained clear during and after POs.   02/03/23: EAT-10 completed today. See above. Pt's neck positioning does not appear improved from previous session. Talked with pt about/reiterated importance of 20 reps of each exercise, as pt reported he was completing approx 10 each/day. As he said, I'm doing 10 plus all my eating, SLP reminded [name] that exercises were in addition to any swallowing that he would do with eating. Procedure was independent.  Pt ate a fig bar and drank water  with a straw. Wet voice heard approx 25%  of the time and pt req'd cues initially to swallow x2-3 with each bite or sip. SLP stressed to pt the need to follow precautions which included multiple swallows, considering eating 4-5 smaller meals per day rather than 2-3 larger meals in order to conserve energy and make eating more safe, and if experiencing nasal regurgitation (not seen on MBS) to take smaller sips and swallow hard. SLP told pt that he is decreasing his RISK for aspiration with completion of the exercises and if he is not completing HEP as directed his is likely not decreasing his risk for aspiration . He voiced understanding. SLP suggested pt cont'd to be seen once a week, [name] asked to be seen once every two weeks.  01/27/23: SLP provided pt with fig bar and water  and pt with hydrophonic voice after 3 bite/sip combinations. SLP told pt to swallow x3 for each bite or sip and he req'd usual min A for this faded to occasional min A. Frequency of hydrophonic voice decr'd after this was suggested by SLP. Pt cont to eat softer foods that don't require as much chewing. SLP again provided pt with overt s/sx aspiration PNA. SLP reiterated pt keep as active as possible with regular walking/exercise to reduce risk of aspiration PNA. SLP also reminded pt of x4/day thorough oral care. Pt told SLP he was performing oral care x3/day regularly and x4 sometimes.  With HEP, pt req'd cues with sequencing of Supraglottic, but was independent by session end. Other exercises were completed with independence. Pt stated he was getting x10/day of each but stated he was working his way to x20/day for each. SLP encouraged him to stay consistent and he has the best chance for this to occur.   01/20/23: Pt needs EAT-10 next session. He ate biscuits and gravy this morning for breakfast. SLP reiterated soft foods to pt. Pt enters today with visibly soiled shirt with what appears to be dried food or dried phlegm, and a blood stain (small) on jacket. He completed HEP  exercises with independence. He has completed all exercises but not 20/day like you said. SLP encouraged pt he may not be able to achieve 20 each exercise, currently, but to do as many as he can at this time and he should be able to incr reps as time goes on. He acknowledged understanding.  Pt removed trach during session today and SLP expressed concern  about this. Pt assured SLP he is performing trach cleaning and care each day, and good oral hygiene as well.   01/14/23 (eval): see education below  PATIENT EDUCATION: Education details: See today's treatment above Person educated: Patient Education method: Explanation, Demonstration, Verbal cues, and Handouts Education comprehension: verbalized understanding, returned demonstration, verbal cues required, and needs further education   ASSESSMENT:  CLINICAL IMPRESSION: STGs checked today. See below. Patient is a 48 y.o. M who was seen today for treatment of swallow after MBS 01/10/23. Pt is currently being seen for torticollis by PT. See today's treatment for more details of today's session. Success of HEP on pt's overall swallow safety at this time is unknown but SLP will educate pt about and cont to encourage pt to complete HEP and follow all swallow safety recommendations.  OBJECTIVE IMPAIRMENTS: include voice disorder and dysphagia. These impairments are limiting patient from effectively communicating at home and in community and safety when swallowing. Factors affecting potential to achieve goals and functional outcome are cooperation/participation level, medical prognosis, previous level of function, severity of impairments, and financial resources. Patient will benefit from skilled SLP services to address above impairments and improve overall function.  REHAB POTENTIAL: Fair given above factors   GOALS: Goals reviewed with patient? Yes  SHORT TERM GOALS: Target date: 02/14/23  Pt will complete HEP with rare min  A Baseline: Goal status: Met  2.  Pt will follow swallow strategies from MBS with POs x2 visits Baseline:  Goal status: Not met  3.  Pt will tell SLP 3 overt s/sx aspiration PNA with modified independence in 2 sessions Baseline: 01/20/23 Goal status: met  4.  Pt will tell SLP rationale for thorough oral care in 2 sessions Baseline: 02/11/23 Goal status: Partially met   LONG TERM GOALS: Target date: 03/14/23  Pt will improve PROM compared to initial administration Baseline:  Goal status: INITIAL  2.  Pt will follow swallow strategies in 1 session after 02/14/23 Baseline:  Goal status: INITIAL  3.  Pt will tell SLP 3 overt s/sx aspiration PNA in 2 sessions Baseline:  Goal status: INITIAL  4.  Pt will tell SLP rationale for thorough oral care after 02/14/23 Baseline:  Goal status: INITIAL   PLAN:  SLP FREQUENCY: 1x/week  SLP DURATION: 8 weeks  PLANNED INTERVENTIONS: 92526 Treatment of swallowing function, Aspiration precaution training, Pharyngeal strengthening exercises, Diet toleration management , Environmental controls, Trials of upgraded texture/liquids, Internal/external aids, SLP instruction and feedback, Compensatory strategies, and Patient/family education    [facility], CCC-SLP 02/11/2023, 9:27 AM For all possible CPT codes, reference the Planned Interventions line above.     Check all conditions that are expected to impact treatment: {Conditions expected to impact treatment:Respiratory disorders, Contractures, spasticity or fracture relevant to requested treatment, and Social determinants of health   If treatment provided at initial evaluation, no treatment charged due to lack of authorization.

## 2023-02-11 NOTE — Addendum Note (Signed)
 Addended by: Dickie La on: 02/11/2023 10:01 AM   Modules accepted: Level of Service

## 2023-02-11 NOTE — Patient Instructions (Signed)
   Please call Cordelia Pen or Alaysha to schedule once/week x4 more weeks. We will probably only need 2-3 more sessions.   [phone]  Thank you!

## 2023-02-11 NOTE — Therapy (Signed)
 OUTPATIENT PHYSICAL THERAPY CERVICAL TREATMENT   Patient Name: David [name] MRN: [id number] DOB:03/03/1975, 48 y.o., male Today's Date: 02/11/2023  END OF SESSION:  PT End of Session - 02/11/23 0901     Visit Number 4    Date for PT Re-Evaluation 03/10/23    Authorization Type trill[employer] approved 8 visits 01/20/2023-03/10/2023 [id number]    Authorization Time Period 01/20/2023-03/10/2023    Authorization - Visit Number 3    Authorization - Number of Visits 8    PT Start Time 0804    PT Stop Time 0841    PT Time Calculation (min) 37 min    Activity Tolerance Patient tolerated treatment well    Behavior During Therapy [facility] for tasks assessed/performed                 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)    Pneumococcal bacteremia 06/09/2021   Pneumothorax    Left lung spontaneous pneumothorax at age 45 yr    S/P percutaneous endoscopic gastrostomy (PEG) tube placement (HCC) 06/24/2021   Past Surgical History:  Procedure Laterality Date   chest tubes Left    IR GASTROSTOMY TUBE MOD SED  06/18/2021   LAPAROSCOPIC INSERTION GASTROSTOMY TUBE N/A 06/20/2021   Procedure: LAPAROSCOPIC INSERTION GASTROSTOMY TUBE;  Surgeon: Signe Mitzie LABOR, MD;  Location: [facility] OR;  Service: General;  Laterality: N/A;  DOW PATIENT   TRACHEOSTOMY TUBE PLACEMENT N/A 06/09/2021   Procedure: AWAKE TRACHEOSTOMY;  Surgeon: Carlie Clark, MD;  Location: [facility] OR;  Service: ENT;  Laterality: N/A;    Patient Active Problem List   Diagnosis Date Noted   Subclinical hypothyroidism 02/10/2023   FTT (failure to thrive) in adult 12/31/2022   Dysphagia, unspecified 12/31/2022   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 12/31/2022   Hepatitis A immune 11/14/2022   End of life care 10/23/2022   Acute purulent bronchitis 10/23/2022   Hepatitis C 10/09/2022   Left leg pain 10/09/2022   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   History of pancytopenia 09/12/2022   Torticollis 07/04/2022   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   Defective dental restoration 12/31/2021   Chronic periodontitis 12/31/2021   Impacted third molar tooth 12/31/2021   Irritant contact dermatitis associated with digestive stoma 12/25/2021   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   Continuous tobacco abuse 11/21/2021   Dermatitis associated with moisture 10/16/2021   Back pain 08/16/2021   Candidal endocarditis    History of radiation to head and neck region 07/11/2021   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   Dysgeusia 07/11/2021   Pressure injury of skin 06/17/2021   Tracheostomy status (HCC)    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   Heart failure with reduced ejection fraction (HCC) 04/28/2021   Anxiety 04/28/2021   Elevated troponin 04/01/2021   Elevated transaminase level 04/01/2021   Weight loss, unintentional 01/15/2021   Protein-calorie malnutrition, severe (HCC) 01/10/2021   Hypotension 01/08/2021   Chemotherapy induced nausea and vomiting 01/01/2021  Encounter for dental examination 12/13/2020   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   Teeth missing 12/04/2020   Radiation caries 12/04/2020   Retained tooth root 12/04/2020   Chronic apical periodontitis 12/04/2020   Accretions on teeth 12/04/2020    PCP: Griffith Porter, MD   REFERRING PROVIDER: [place] Fannie SAILOR, NP   REFERRING DIAG: Diagnosis Information  Diagnosis  C32.9  (ICD-10-CM) - Laryngeal cancer (HCC)    THERAPY DIAG:  Cervicalgia  Abnormal posture  Muscle weakness (generalized)  Rationale for Evaluation and Treatment: Rehabilitation  ONSET DATE: 1.5 year history (trach placement)  SUBJECTIVE:                                                                                                                                                                                                         SUBJECTIVE STATEMENT: I've been doing my exercises.  I wasn't sore after last session.    From Eval: Pt presents to PT with Rt sided neck tension and shortened tissue secondary to radiation.  Pt has difficulty holding up his head due to pain, postural weakness, and shortened tissue on the Rt due to radiation.  Pt was discharged from PT in August due to clot in Rt jugular vein.  Clot is now resolved.  He reports that he has been stretching and working on posture at home.   Hand dominance: Right  PERTINENT HISTORY:  Tracheostomy, radiation to neck, malignant neoplasm of supraglottis, thrombosis of Rt internal jugular vein, torticollis, heart failure  PAIN: 02/11/2023 Are you having pain? Yes: NPRS scale: 3/10 Pain location: Rt neck Pain description: stiff, throbbing Aggravating factors: overstretching, trying to stretch  Relieving factors: massaging, medication  PRECAUTIONS: Other: cancer, trach  WEIGHT BEARING RESTRICTIONS: No  FALLS:  Has patient fallen in last 6 months? No  LIVING ENVIRONMENT: Lives with: lives alone Lives in: House/apartment   PLOF: Independent, walking to mailbox  PATIENT GOALS: reduce neck pain, improve mobility   NEXT MD VISIT:  January 2025  OBJECTIVE:   DIAGNOSTIC FINDINGS:  None   PATIENT SURVEYS:  NDI 15/50  COGNITION: Overall cognitive status: Within functional limits for tasks assessed  SENSATION: WFL  POSTURE: rounded shoulders, forward head, increased thoracic kyphosis, and flexed trunk    PALPATION: Significant tension and reduced muscle length in bil neck. Fixed cervical spine segments with minimal mobility allowed.    CERVICAL ROM:   Active ROM A/PROM (deg) 01/13/23  Flexion Rests in 30 degrees flexion  Extension   Right lateral flexion Rests in 30 degrees  Left lateral flexion 10 degrees Rt  lateral flexion  Right rotation 50 degrees   Left rotation 25 degrees    (Blank rows = not tested)  UPPER EXTREMITY ROM:  UE A/ROM is WFLs without pain  UPPER EXTREMITY MMT: 4+/5 bil UE strength, neck strength 4-/5 throughout   TODAY'S TREATMENT:                                                                                                                              DATE:  02/11/2023 Seated scapular Retraction x 10 5 sec holds Arm Bike: Level 1 78min/2min, then 1 min/1 min-focus on keeping head upright Seated shoulder flexion and scaption 1# 2x10 Open book stretch x10 bil each Manual: Passive stretch into Lt sidebending and rotation, elongation to soft tissue to improve movement.   DATE:  02/05/2023 Seated scapular Retraction x 10 5 sec holds Arm Bike: Level 1 x 4 minutes-1 min forward, rest, 1 min reverse, rest and repeat.-focus on keeping head upright Standing red band: shoulder extension 2x10 Seated red band rows: 2x10 Trigger Point Dry Needling  Subsequent Treatment: Instructions reviewed, if requested by the patient, prior to subsequent dry needling treatment.   Patient Verbal Consent Given: Yes Education Handout Provided: Yes Muscles Treated: bil upper traps, bil cervical paraspinals  Electrical Stimulation Performed: No Treatment Response/Outcome: improved tissue mobility and twitch response    Manual: elongation and release to Rt>Lt neck after DN 01/31/2023 Seated scapular Retraction x 10 5 sec holds Seated cervical retraction 2 x 10 Seated cervical sidebending 2 x10 Seated cervical SNAGS (Lt rotation) x 10 8 sec hold Supine chin tucks 2 x  10 Supine scapular retractions 2 x 10 Manual: Cervical PROM & upper trap stretching into Lt sidebending & Rt rotation      PATIENT EDUCATION:  Education details: Access Code: [email or web address] Person educated: Patient Education method: Explanation, Demonstration, and Handouts Education comprehension: verbalized understanding and returned demonstration  HOME EXERCISE PROGRAM: Access Code: [email or web address] URL: [email or web address] Date: 05/30/2022 Prepared by: Burnard  Exercises - Seated Scapular Retraction  - 5 x daily - 7 x weekly - 1 sets - 10 reps - 5 hold - Supine Scapular Retraction  - 3 x daily - 7 x weekly - 1 sets - 10 reps - 5 hold - Seated Correct Posture  - 1 x daily - 7 x weekly - 3 sets - 10 reps - Seated Cervical Retraction  - 1 x daily - 7 x weekly - 3 sets - 10 reps - Supine Chin Tuck  - 1 x daily - 7 x weekly - 3 sets - 10 reps - Seated Cervical Sidebending AROM  - 3 x daily - 7 x weekly - 1 sets - 10 reps - 5-10 hold  ASSESSMENT:  CLINICAL IMPRESSION: Pt did well with additional time on the arm bike and kept head upright as much as cervical contracture will allow. He added weights with seated raise and tolerated well.  Patient required minimal verbal cues for  form correction. He responds well to manual therapy with passive strength . Patient will benefit from skilled PT to address the below impairments and improve overall function.   OBJECTIVE IMPAIRMENTS: decreased activity tolerance, increased muscle spasms, impaired flexibility, postural dysfunction, and pain.   ACTIVITY LIMITATIONS: sitting, standing, and reach over head  PARTICIPATION LIMITATIONS: meal prep, driving, and community activity  PERSONAL FACTORS: 1-2 comorbidities: cancer on pallative care, chronic postural dysfunction  are also affecting patient's functional outcome.   REHAB POTENTIAL: Good  CLINICAL DECISION MAKING: Stable/uncomplicated  EVALUATION COMPLEXITY: Moderate   GOALS: Goals  reviewed with patient? Yes  SHORT TERM GOALS: Target date: 02/10/2023      Be independent in initial  HEP Baseline:  Goal status: INITIAL  2.  Verbalize and demonstrate postural corrections to improve tolerance for neutral posture and reduced neck strain  Baseline:  Goal status: INITIAL   LONG TERM GOALS: Target date: 03/10/2023    Be independent in advanced HEP Baseline:  Goal status: INITIAL  3.  Improve NDI to < or = to 7/50 Baseline: 15/50 Goal status: INITIAL  3.   Perform independent postural correction and hold this x 1-2 minutes  Baseline: needs visual feedback and can hold <30seconds  Goal status: INITIAL   4.   Report > or = to 40% reduction in neck pain due to reduced tension and strain in neck Baseline: constant strain with sitting upright  Goal status: INITIAL    PLAN:  PT FREQUENCY: 1x/week  PT DURATION: 8 weeks  PLANNED INTERVENTIONS: Therapeutic exercises, Therapeutic activity, Neuromuscular re-education, Balance training, Gait training, Patient/Family education, Self Care, Joint mobilization, Aquatic Therapy, Dry Needling, Electrical stimulation, Spinal mobilization, Cryotherapy, Moist heat, Taping, Traction, Manual therapy, and Re-evaluation  PLAN FOR NEXT SESSION:Continue work on postural reeducation, passive stretching, manual to address muscle tension, DN    Burnard Joy, PT 02/11/23 9:07 AM  [facility] [address], [address] [place], [place] [address] Phone # [phone] Fax [phone]

## 2023-02-11 NOTE — Progress Notes (Signed)
 Internal [facility] Clinic Attending  Case discussed with the resident at the time of the visit.  We reviewed the resident's history and exam and pertinent patient test results.  I agree with the assessment, diagnosis, and plan of care documented in the resident's note.

## 2023-02-14 ENCOUNTER — Other Ambulatory Visit: Payer: Self-pay

## 2023-02-14 DIAGNOSIS — F419 Anxiety disorder, unspecified: Secondary | ICD-10-CM

## 2023-02-14 DIAGNOSIS — C329 Malignant neoplasm of larynx, unspecified: Secondary | ICD-10-CM

## 2023-02-14 DIAGNOSIS — Z515 Encounter for palliative care: Secondary | ICD-10-CM

## 2023-02-14 DIAGNOSIS — M62838 Other muscle spasm: Secondary | ICD-10-CM

## 2023-02-14 MED ORDER — DIAZEPAM 5 MG/5ML PO SOLN: 2.0000 mL | Freq: Three times a day (TID) | ORAL | 0 refills | Status: DC | PRN

## 2023-02-14 NOTE — Telephone Encounter (Signed)
Pt mother called for refill, see associated orders.

## 2023-02-19 ENCOUNTER — Ambulatory Visit: Payer: MEDICAID | Admitting: Physical Therapy

## 2023-02-19 ENCOUNTER — Ambulatory Visit: Payer: MEDICAID

## 2023-02-19 ENCOUNTER — Encounter: Payer: Self-pay | Admitting: Physical Therapy

## 2023-02-19 DIAGNOSIS — R1312 Dysphagia, oropharyngeal phase: Secondary | ICD-10-CM | POA: Diagnosis not present

## 2023-02-19 DIAGNOSIS — R293 Abnormal posture: Secondary | ICD-10-CM

## 2023-02-19 DIAGNOSIS — M6281 Muscle weakness (generalized): Secondary | ICD-10-CM

## 2023-02-19 DIAGNOSIS — M542 Cervicalgia: Secondary | ICD-10-CM

## 2023-02-19 DIAGNOSIS — R490 Dysphonia: Secondary | ICD-10-CM

## 2023-02-19 NOTE — Patient Instructions (Signed)
   Signs of Aspiration Pneumonia   Chest pain/tightness Fever (can be low grade) Cough  With foul-smelling phlegm (sputum) With sputum containing pus or blood With greenish sputum Fatigue  Shortness of breath  Wheezing   **IF YOU HAVE THESE SIGNS, CONTACT YOUR DOCTOR OR GO TO THE EMERGENCY DEPARTMENT OR URGENT CARE AS SOON AS POSSIBLE**     

## 2023-02-19 NOTE — Therapy (Signed)
OUTPATIENT SPEECH LANGUAGE PATHOLOGY SWALLOW TREATMENT   Patient Name: David [name] MRN: [id number] DOB:08-25-1975, 48 y.o., male Today's Date: 02/19/2023  PCP: Faith Rogue, MD REFERRING PROVIDER: Lonie Peak, MD  END OF SESSION:  End of Session - 02/19/23 1515     Visit Number 6    Number of Visits 9    Date for SLP Re-Evaluation 03/14/23    SLP Start Time 1021    SLP Stop Time  1054    SLP Time Calculation (min) 33 min    Activity Tolerance Patient tolerated treatment well                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)    Pneumococcal bacteremia 06/09/2021   Pneumothorax    Left lung spontaneous pneumothorax at age 64 yr    S/P percutaneous endoscopic gastrostomy (PEG) tube placement (HCC) 06/24/2021   Past Surgical History:  Procedure Laterality Date   chest tubes Left    IR GASTROSTOMY TUBE MOD SED  06/18/2021   LAPAROSCOPIC INSERTION GASTROSTOMY TUBE N/A 06/20/2021   Procedure: LAPAROSCOPIC INSERTION GASTROSTOMY TUBE;  Surgeon: Berna Bue, MD;  Location: [facility] OR;  Service: General;  Laterality: N/A;  DOW PATIENT   TRACHEOSTOMY TUBE PLACEMENT N/A 06/09/2021   Procedure: AWAKE TRACHEOSTOMY;  Surgeon: Christia Reading, MD;  Location: [facility];  Service: ENT;  Laterality: N/A;   Patient Active Problem List   Diagnosis Date Noted   Subclinical hypothyroidism 02/10/2023   FTT (failure to thrive) in adult 12/31/2022   Dysphagia, unspecified 12/31/2022    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 12/31/2022   Hepatitis A immune 11/14/2022   End of life care 10/23/2022   Acute purulent bronchitis 10/23/2022   Hepatitis C 10/09/2022   Left leg pain 10/09/2022   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   History of pancytopenia 09/12/2022   Torticollis 07/04/2022   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   Defective dental restoration 12/31/2021   Chronic periodontitis 12/31/2021   Impacted third molar tooth 12/31/2021   Irritant contact dermatitis associated with digestive stoma 12/25/2021   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   Continuous tobacco abuse 11/21/2021   Dermatitis associated with moisture 10/16/2021   Back pain 08/16/2021   Candidal endocarditis    History of radiation to head and neck region 07/11/2021   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   Dysgeusia 07/11/2021   Pressure injury of skin 06/17/2021   Tracheostomy status (HCC)    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   Heart failure with reduced ejection fraction (HCC) 04/28/2021   Anxiety 04/28/2021   Elevated troponin 04/01/2021   Elevated transaminase level 04/01/2021   Weight loss, unintentional 01/15/2021   Protein-calorie malnutrition, severe (HCC) 01/10/2021   Hypotension 01/08/2021   Chemotherapy induced nausea and vomiting 01/01/2021   Encounter for dental examination 12/13/2020   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   Teeth missing 12/04/2020   Radiation caries 12/04/2020   Retained tooth root 12/04/2020  Chronic apical periodontitis 12/04/2020   Accretions on teeth 12/04/2020    ONSET DATE: 2023   REFERRING DIAG:  C32.9 (ICD-10-CM) - Laryngeal cancer (HCC)    THERAPY DIAG:  Dysphagia, oropharyngeal phase  Hoarseness of voice  Rationale for Evaluation and Treatment: Rehabilitation  SUBJECTIVE:   SUBJECTIVE STATEMENT: "No cancer - found out yesterday."  Pt accompanied by:  self  PERTINENT HISTORY: Pt well known to this clinician from previous plans of care. HPI: s/p chemo and XRT. Has had slow but steady overall decline over the summer. Specifically significant adult FTT, torticollis with right ear touching right shoulder, limited ROM of neck and dependent lymphedema right face, mild stage 1 pressure/breakdown to left of trach stoma. He is followed by Anders Simmonds at trach clinic, most recently 12/31/22; trach changed to [place] Bivona size 7. Significant air trapping with capping valve, not a candidate for decannulation. Hx trach 06/09/21, PEG 06/20/21, subsequently "fell out" per his report. Most recent MBS 07/25/21: mild pharyngeal residuals, head turn to right, cough/re-swallow helped with airway protection. Aspiration of thins. Regular/thins recommended. OP SLP from 9-12/23. Pt reports worsening dysphagia with coughing, occasional nasal regurgitation. MBS completed 01/10/23, results below.   PAIN:  Are you having pain? No  FALLS: Has patient fallen in last 6 months?  Yes; one  PATIENT GOALS: get this swallowing better  OBJECTIVE:  Note: Objective measures were completed at Evaluation unless otherwise noted. 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  Quality of imaging was compromised by shoulder/neck positioning and it was difficult to visualize anatomical markers. There were no incidents of nasal regurgitation on today's study (reported to be happening more frequently). There was minimal mobility of the larynx and incomplete laryngeal closure, leading to intermittent aspiration of thin and nectar thick liquids, occurring before and after the swallow response. There was frequent tongue pumping and effort to propel material  from mouth through pharynx, leaving residuals in posterior oral cavity with multiple f/u swallows needed to move bolus into throat. There was minimal pharyngeal residue post-swallow.    Objective recommended compensations:  Recommendations: PO diet PO Diet Recommendation: Regular; Thin liquids (Level 0) Medication Administration: Whole meds with puree Supervision: Patient able to self-feed Swallowing strategies  : Place PMSV during PO intake Oral care recommendations: Oral care QID (4x/day)  PATIENT REPORTED OUTCOME MEASURES (PROM): EAT-10: 7/40, with higher scores indicating that swallowing is negatively affecting pt's QOL.    TODAY'S TREATMENT:                                                                                                                                         DATE:  02/19/23: "I take small bites and sips and that helps to  keep it from getting stuck in my nose. Dr Hezzie Bump said the food in my nose is going to happen." Today pt had sausage and gravy, and has had beefaroni, chicken salad sandwiches in the last few days. Today he ate a strawberry fig bar without reported nasal regurgitation. He adhered to adequately small bites. Water consumption was via straw and he did not have any overt s/sx pharyngeal deficits with water or with fig bar. Multiple dry swallows were completed for each bite. He waited for oral clearance of bolus before having next bite or sip.   Pt states he has been doing the full scope of his swallow exercises, approx twice a day. He tells SLP he has been doing two sets of 10. His procedure with HEP today was actually WNL. SLP encouraged pt to complete 25 or 30 reps/day. Overt s/sx PNA were shared with pt today and told to go to ED or urgent care ASAP if he has >1 of these sx at one time.  02/11/23: "I hope the cancer hasn't come back. I have some kind of a scan and a biopsy (on 02/18/23)." Pt entered with a clear voice. Pt performed HEP with rare min A (tongue  protrusion on [place]). [name] again stated he was performing 10 reps per day for each exercise of the HEP; SLP reminded pt again about 20 reps minimum of each exercise, and provided rationale for this. He ate fig bar and drank water today without spontaneously following precautions. After SLP reminded pt of precautions he followed them. No overt s/sx of aspiration today with POs; pt's voice remained clear during and after POs.   02/03/23: EAT-10 completed today. See above. Pt's neck positioning does not appear improved from previous session. Talked with pt about/reiterated importance of 20 reps of each exercise, as pt reported he was completing approx 10 each/day. As he said, "I'm doing 10 plus all my eating," SLP reminded [name] that exercises were in addition to any swallowing that he would do with eating. Procedure was independent.  Pt ate a fig bar and drank water with a straw. "Wet" voice heard approx 25% of the time and pt req'd cues initially to swallow x2-3 with each bite or sip. SLP stressed to pt the need to follow precautions which included multiple swallows, considering eating 4-5 smaller meals per day rather than 2-3 larger meals in order to conserve energy and make eating more safe, and if experiencing nasal regurgitation (not seen on MBS) to take smaller sips and swallow hard. SLP told pt that he is decreasing his RISK for aspiration with completion of the exercises and if he is not completing HEP as directed his is likely not decreasing his risk for aspiration . He voiced understanding. SLP suggested pt cont'd to be seen once a week, [name] asked to be seen once every two weeks.  01/27/23: SLP provided pt with fig bar and water and pt with hydrophonic voice after 3 bite/sip combinations. SLP told pt to swallow x3 for each bite or sip and he req'd usual min A for this faded to occasional min A. Frequency of hydrophonic voice decr'd after this was suggested by SLP. Pt cont to eat softer foods that  don't require as much chewing. SLP again provided pt with overt s/sx aspiration PNA. SLP reiterated pt keep as active as possible with regular walking/exercise to reduce risk of aspiration PNA. SLP also reminded pt of x4/day thorough oral care. Pt told SLP he was performing oral care x3/day  regularly and x4 sometimes.  With HEP, pt req'd cues with sequencing of Supraglottic, but was independent by session end. Other exercises were completed with independence. Pt stated he was getting x10/day of each but stated he was working his way to x20/day for each. SLP encouraged him to stay consistent and he has the best chance for this to occur.   01/20/23: Pt needs EAT-10 next session. He ate biscuits and gravy this morning for breakfast. SLP reiterated soft foods to pt. Pt enters today with visibly soiled shirt with what appears to be dried food or dried phlegm, and a blood stain (small) on jacket. He completed HEP exercises with independence. He has completed all exercises but "not 20/day like you said". SLP encouraged pt he may not be able to achieve 20 each exercise, currently, but to do as many as he can at this time and he should be able to incr reps as time goes on. He acknowledged understanding.  Pt removed trach during session today and SLP expressed concern about this. Pt assured SLP he is performing trach cleaning and care each day, and good oral hygiene as well.   01/14/23 (eval): see "education" below  PATIENT EDUCATION: Education details: See "today's treatment" above Person educated: Patient Education method: Explanation, Demonstration, Verbal cues, and Handouts Education comprehension: verbalized understanding, returned demonstration, verbal cues required, and needs further education   ASSESSMENT:  CLINICAL IMPRESSION: LTG added for HEP completion. Patient is a 48 y.o. M who was seen today for treatment of swallow after MBS 01/10/23. Pt is currently being seen for torticollis by PT. See  "today's treatment" for more details of today's session. Today pt followed swallow precautions of multiple swallows for each bite/sip, small bites and sips, and alternate bite/sip. His procedure with his prescribed HEP was WNL. Pt and SLP agree if pt status remains static next session d/c can be considered.   OBJECTIVE IMPAIRMENTS: include voice disorder and dysphagia. These impairments are limiting patient from effectively communicating at home and in community and safety when swallowing. Factors affecting potential to achieve goals and functional outcome are cooperation/participation level, medical prognosis, previous level of function, severity of impairments, and financial resources. Patient will benefit from skilled SLP services to address above impairments and improve overall function.  REHAB POTENTIAL: Fair given above factors   GOALS: Goals reviewed with patient? Yes  SHORT TERM GOALS: Target date: 02/14/23  Pt will complete HEP with rare min A Baseline: Goal status: Met  2.  Pt will follow swallow strategies from MBS with POs x2 visits Baseline:  Goal status: Not met  3.  Pt will tell SLP 3 overt s/sx aspiration PNA with modified independence in 2 sessions Baseline: 01/20/23 Goal status: met  4.  Pt will tell SLP rationale for thorough oral care in 2 sessions Baseline: 02/11/23 Goal status: Partially met   LONG TERM GOALS: Target date: 03/14/23  Pt will improve PROM compared to initial administration Baseline:  Goal status: INITIAL  2.  Pt will follow swallow strategies in 1 session after 02/14/23 Baseline:  Goal status: met  3.  Pt will tell SLP 3 overt s/sx aspiration PNA in 2 sessions Baseline: 02/19/23 Goal status: INITIAL  4.  Pt will tell SLP rationale for thorough oral care after 02/14/23 Baseline:  Goal status: INITIAL  5. Pt will complete HEP with independence in two sessions Baseline: 02/19/23 Goal status: INITIAL  PLAN:  SLP FREQUENCY: every other  week  SLP DURATION: 8 weeks  PLANNED INTERVENTIONS:  72536 Treatment of swallowing function, Aspiration precaution training, Pharyngeal strengthening exercises, Diet toleration management , Environmental controls, Trials of upgraded texture/liquids, Internal/external aids, SLP instruction and feedback, Compensatory strategies, and Patient/family education    [facility], CCC-SLP 02/19/2023, 3:15 PM For all possible CPT codes, reference the Planned Interventions line above.     Check all conditions that are expected to impact treatment: {Conditions expected to impact treatment:Respiratory disorders, Contractures, spasticity or fracture relevant to requested treatment, and Social determinants of health   If treatment provided at initial evaluation, no treatment charged due to lack of authorization.

## 2023-02-19 NOTE — Therapy (Signed)
OUTPATIENT PHYSICAL THERAPY CERVICAL TREATMENT   Patient Name: David [name] MRN: [id number] DOB:1975/10/13, 48 y.o., male Today's Date: 02/19/2023  END OF SESSION:  PT End of Session - 02/19/23 0857     Visit Number 5    Date for PT Re-Evaluation 03/10/23    Authorization Type trillium approved 8 visits 01/20/2023-03/10/2023 [id number]    Authorization Time Period 01/20/2023-03/10/2023    Authorization - Visit Number 4    Authorization - Number of Visits 8    PT Start Time 0810   patient was late to appointment   PT Stop Time 0849    PT Time Calculation (min) 39 min    Activity Tolerance Patient tolerated treatment well    Behavior During Therapy [facility] for tasks assessed/performed                  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)    Pneumococcal bacteremia 06/09/2021   Pneumothorax    Left lung spontaneous pneumothorax at age 13 yr    S/P percutaneous endoscopic gastrostomy (PEG) tube placement (HCC) 06/24/2021   Past Surgical History:  Procedure Laterality Date   chest tubes Left    IR GASTROSTOMY TUBE MOD SED  06/18/2021   LAPAROSCOPIC INSERTION GASTROSTOMY TUBE N/A 06/20/2021   Procedure: LAPAROSCOPIC INSERTION GASTROSTOMY TUBE;  Surgeon: Berna Bue, MD;  Location: [facility] OR;  Service: General;  Laterality: N/A;  DOW PATIENT   TRACHEOSTOMY TUBE PLACEMENT N/A 06/09/2021   Procedure: AWAKE TRACHEOSTOMY;  Surgeon: Christia Reading, MD;  Location: [facility];   Service: ENT;  Laterality: N/A;   Patient Active Problem List   Diagnosis Date Noted   Subclinical hypothyroidism 02/10/2023   FTT (failure to thrive) in adult 12/31/2022   Dysphagia, unspecified 12/31/2022   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 12/31/2022   Hepatitis A immune 11/14/2022   End of life care 10/23/2022   Acute purulent bronchitis 10/23/2022   Hepatitis C 10/09/2022   Left leg pain 10/09/2022   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   History of pancytopenia 09/12/2022   Torticollis 07/04/2022   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   Defective dental restoration 12/31/2021   Chronic periodontitis 12/31/2021   Impacted third molar tooth 12/31/2021   Irritant contact dermatitis associated with digestive stoma 12/25/2021   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   Continuous tobacco abuse 11/21/2021   Dermatitis associated with moisture 10/16/2021   Back pain 08/16/2021   Candidal endocarditis    History of radiation to head and neck region 07/11/2021   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   Dysgeusia 07/11/2021   Pressure injury of skin 06/17/2021   Tracheostomy status (HCC)    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   Heart failure with reduced ejection fraction (HCC) 04/28/2021   Anxiety 04/28/2021   Elevated troponin 04/01/2021   Elevated transaminase level 04/01/2021   Weight loss, unintentional 01/15/2021   Protein-calorie malnutrition, severe (HCC) 01/10/2021   Hypotension 01/08/2021   Chemotherapy  induced nausea and vomiting 01/01/2021   Encounter for dental examination 12/13/2020   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   Teeth missing 12/04/2020   Radiation caries 12/04/2020   Retained tooth root 12/04/2020   Chronic apical periodontitis 12/04/2020   Accretions on teeth 12/04/2020    PCP: Adron Bene, MD   REFERRING PROVIDER: Glee Arvin, NP   REFERRING DIAG: Diagnosis  Information  Diagnosis  C32.9 (ICD-10-CM) - Laryngeal cancer (HCC)    THERAPY DIAG:  Cervicalgia  Abnormal posture  Muscle weakness (generalized)  Rationale for Evaluation and Treatment: Rehabilitation  ONSET DATE: 1.5 year history (trach placement)  SUBJECTIVE:                                                                                                                                                                                                         SUBJECTIVE STATEMENT: Patient reports he is doing good today. He was not sore after last treatment session.  From Eval: Pt presents to PT with Rt sided neck tension and shortened tissue secondary to radiation.  Pt has difficulty holding up his head due to pain, postural weakness, and shortened tissue on the Rt due to radiation.  Pt was discharged from PT in August due to clot in Rt jugular vein.  Clot is now resolved.  He reports that he has been stretching and working on posture at home.   Hand dominance: Right  PERTINENT HISTORY:  Tracheostomy, radiation to neck, malignant neoplasm of supraglottis, thrombosis of Rt internal jugular vein, torticollis, heart failure  PAIN: 02/19/2023 Are you having pain? Yes: NPRS scale: 0/10 Pain location: Rt neck Pain description: stiff, throbbing Aggravating factors: overstretching, trying to stretch  Relieving factors: massaging, medication  PRECAUTIONS: Other: cancer, trach  WEIGHT BEARING RESTRICTIONS: No  FALLS:  Has patient fallen in last 6 months? No  LIVING ENVIRONMENT: Lives with: lives alone Lives in: House/apartment   PLOF: Independent, walking to mailbox  PATIENT GOALS: reduce neck pain, improve mobility   NEXT MD VISIT:  January 2025  OBJECTIVE:   DIAGNOSTIC FINDINGS:  None   PATIENT SURVEYS:  NDI 15/50  COGNITION: Overall cognitive status: Within functional limits for tasks assessed  SENSATION: WFL  POSTURE: rounded shoulders, forward head,  increased thoracic kyphosis, and flexed trunk   PALPATION: Significant tension and reduced muscle length in bil neck. Fixed cervical spine segments with minimal mobility allowed.    CERVICAL ROM:   Active ROM A/PROM (deg) 01/13/23  Flexion Rests in 30 degrees flexion  Extension   Right lateral flexion Rests in 30  degrees  Left lateral flexion 10 degrees Rt lateral flexion  Right rotation 50 degrees   Left rotation 25 degrees    (Blank rows = not tested)  UPPER EXTREMITY ROM:  UE A/ROM is WFLs without pain  UPPER EXTREMITY MMT: 4+/5 bil UE strength, neck strength 4-/5 throughout   TODAY'S TREATMENT:                                                                                                                              DATE:  02/19/2023 Arm Bike: Level 1 49min/2min, then 1 min/1 min-focus on keeping head upright Seated scapular Retraction x 10 5 sec holds Seated red band rows: 2x10 Seated shoulder flexion and scaption 1# 2x10 Open book stretch x10 bil each Standing shoulder abduction & ER with red TB 2 x 10 Manual: Passive stretch into Lt sidebending and rotation, elongation to soft tissue to improve movement.     02/11/2023 Seated scapular Retraction x 10 5 sec holds Arm Bike: Level 1 52min/2min, then 1 min/1 min-focus on keeping head upright Seated shoulder flexion and scaption 1# 2x10 Open book stretch x10 bil each Manual: Passive stretch into Lt sidebending and rotation, elongation to soft tissue to improve movement.   DATE:  02/05/2023 Seated scapular Retraction x 10 5 sec holds Arm Bike: Level 1 x 4 minutes-1 min forward, rest, 1 min reverse, rest and repeat.-focus on keeping head upright Standing red band: shoulder extension 2x10 Seated red band rows: 2x10 Trigger Point Dry Needling  Subsequent Treatment: Instructions reviewed, if requested by the patient, prior to subsequent dry needling treatment.   Patient Verbal Consent Given: Yes Education Handout  Provided: Yes Muscles Treated: bil upper traps, bil cervical paraspinals  Electrical Stimulation Performed: No Treatment Response/Outcome: improved tissue mobility and twitch response    Manual: elongation and release to Rt>Lt neck after DN       PATIENT EDUCATION:  Education details: Access Code: [email or web address] Person educated: Patient Education method: Explanation, Demonstration, and Handouts Education comprehension: verbalized understanding and returned demonstration  HOME EXERCISE PROGRAM: Access Code: [email or web address] URL: [email or web address] Date: 02/19/2023 Prepared by: Claude Manges  Exercises - Seated Scapular Retraction  - 5 x daily - 7 x weekly - 1 sets - 10 reps - 5 hold - Supine Scapular Retraction  - 3 x daily - 7 x weekly - 1 sets - 10 reps - 5 hold - Seated Correct Posture  - 1 x daily - 7 x weekly - 3 sets - 10 reps - Seated Cervical Retraction  - 1 x daily - 7 x weekly - 3 sets - 10 reps - Supine Chin Tuck  - 1 x daily - 7 x weekly - 3 sets - 10 reps - Seated Cervical Sidebending AROM  - 3 x daily - 7 x weekly - 1 sets - 10 reps - 5-10 hold - Seated Assisted Cervical Rotation with Towel  - 5 x daily - 7 x weekly -  1 sets - 3 reps - 10 hold - Shoulder External Rotation and Scapular Retraction with Resistance  - 1 x daily - 7 x weekly - 2 sets - 10 reps - Standing Shoulder Row with Anchored Resistance  - 1 x daily - 7 x weekly - 2 sets - 10 reps - Shoulder extension with resistance - Neutral  - 1 x daily - 7 x weekly - 2 sets - 10 reps  ASSESSMENT:  CLINICAL IMPRESSION: Today's treatment session focused on periscapular strengthening and tissue mobility. Patient tolerated treatment session well. He required verbal and visual cues for correct exercise performance. Updated HEP to include periscapular exercises. Patient will benefit from skilled PT to address the below impairments and improve overall function.    OBJECTIVE IMPAIRMENTS: decreased activity tolerance,  increased muscle spasms, impaired flexibility, postural dysfunction, and pain.   ACTIVITY LIMITATIONS: sitting, standing, and reach over head  PARTICIPATION LIMITATIONS: meal prep, driving, and community activity  PERSONAL FACTORS: 1-2 comorbidities: cancer on pallative care, chronic postural dysfunction  are also affecting patient's functional outcome.   REHAB POTENTIAL: Good  CLINICAL DECISION MAKING: Stable/uncomplicated  EVALUATION COMPLEXITY: Moderate   GOALS: Goals reviewed with patient? Yes  SHORT TERM GOALS: Target date: 02/10/2023      Be independent in initial  HEP Baseline:  Goal status: INITIAL  2.  Verbalize and demonstrate postural corrections to improve tolerance for neutral posture and reduced neck strain  Baseline:  Goal status: INITIAL   LONG TERM GOALS: Target date: 03/10/2023    Be independent in advanced HEP Baseline:  Goal status: INITIAL  3.  Improve NDI to < or = to 7/50 Baseline: 15/50 Goal status: INITIAL  3.   Perform independent postural correction and hold this x 1-2 minutes  Baseline: needs visual feedback and can hold <30seconds  Goal status: INITIAL   4.   Report > or = to 40% reduction in neck pain due to reduced tension and strain in neck Baseline: constant strain with sitting upright  Goal status: INITIAL    PLAN:  PT FREQUENCY: 1x/week  PT DURATION: 8 weeks  PLANNED INTERVENTIONS: Therapeutic exercises, Therapeutic activity, Neuromuscular re-education, Balance training, Gait training, Patient/Family education, Self Care, Joint mobilization, Aquatic Therapy, Dry Needling, Electrical stimulation, Spinal mobilization, Cryotherapy, Moist heat, Taping, Traction, Manual therapy, and Re-evaluation  PLAN FOR NEXT SESSION: assess new HEP; Continue work on postural reeducation, passive stretching, manual to address muscle tension, DN    Claude Manges, PT 02/19/23 8:58 AM [facility] [address]  [address] [place], [place] [address] Phone # [phone] Fax [phone]

## 2023-02-26 ENCOUNTER — Ambulatory Visit: Payer: MEDICAID

## 2023-02-26 DIAGNOSIS — M542 Cervicalgia: Secondary | ICD-10-CM | POA: Diagnosis not present

## 2023-02-26 DIAGNOSIS — M6281 Muscle weakness (generalized): Secondary | ICD-10-CM

## 2023-02-26 DIAGNOSIS — R293 Abnormal posture: Secondary | ICD-10-CM

## 2023-02-26 NOTE — Therapy (Signed)
OUTPATIENT PHYSICAL THERAPY CERVICAL TREATMENT   Patient Name: David [name] MRN: [id number] DOB:Oct 06, 1975, 48 y.o., male Today's Date: 02/26/2023  END OF SESSION:  PT End of Session - 02/26/23 0847     Visit Number 6    Date for PT Re-Evaluation 03/10/23    Authorization Type trillium approved 8 visits 01/20/2023-03/10/2023 NFAO#[id number]    Authorization Time Period 01/20/2023-03/10/2023    Authorization - Visit Number 5    Authorization - Number of Visits 8    PT Start Time 0812    PT Stop Time 0846    PT Time Calculation (min) 34 min    Activity Tolerance Patient tolerated treatment well    Behavior During Therapy [facility] for tasks assessed/performed                   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)    Pneumococcal bacteremia 06/09/2021   Pneumothorax    Left lung spontaneous pneumothorax at age 62 yr    S/P percutaneous endoscopic gastrostomy (PEG) tube placement (HCC) 06/24/2021   Past Surgical History:  Procedure Laterality Date   chest tubes Left    IR GASTROSTOMY TUBE MOD SED  06/18/2021   LAPAROSCOPIC INSERTION GASTROSTOMY TUBE N/A 06/20/2021   Procedure: LAPAROSCOPIC INSERTION GASTROSTOMY TUBE;  Surgeon: Berna Bue, MD;  Location: [facility] OR;  Service: General;  Laterality: N/A;  DOW PATIENT   TRACHEOSTOMY TUBE PLACEMENT N/A 06/09/2021   Procedure: AWAKE TRACHEOSTOMY;  Surgeon: Christia Reading, MD;  Location: [facility] OR;  Service: ENT;  Laterality: N/A;    Patient Active Problem List   Diagnosis Date Noted   Subclinical hypothyroidism 02/10/2023   FTT (failure to thrive) in adult 12/31/2022   Dysphagia, unspecified 12/31/2022   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 12/31/2022   Hepatitis A immune 11/14/2022   End of life care 10/23/2022   Acute purulent bronchitis 10/23/2022   Hepatitis C 10/09/2022   Left leg pain 10/09/2022   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   History of pancytopenia 09/12/2022   Torticollis 07/04/2022   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   Defective dental restoration 12/31/2021   Chronic periodontitis 12/31/2021   Impacted third molar tooth 12/31/2021   Irritant contact dermatitis associated with digestive stoma 12/25/2021   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   Continuous tobacco abuse 11/21/2021   Dermatitis associated with moisture 10/16/2021   Back pain 08/16/2021   Candidal endocarditis    History of radiation to head and neck region 07/11/2021   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   Dysgeusia 07/11/2021   Pressure injury of skin 06/17/2021   Tracheostomy status (HCC)    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   Heart failure with reduced ejection fraction (HCC) 04/28/2021   Anxiety 04/28/2021   Elevated troponin 04/01/2021   Elevated transaminase level 04/01/2021   Weight loss, unintentional 01/15/2021   Protein-calorie malnutrition, severe (HCC) 01/10/2021   Hypotension 01/08/2021   Chemotherapy induced nausea and vomiting 01/01/2021  Encounter for dental examination 12/13/2020   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   Teeth missing 12/04/2020   Radiation caries 12/04/2020   Retained tooth root 12/04/2020   Chronic apical periodontitis 12/04/2020   Accretions on teeth 12/04/2020    PCP: Adron Bene, MD   REFERRING PROVIDER: Glee Arvin, NP   REFERRING DIAG: Diagnosis Information  Diagnosis  C32.9  (ICD-10-CM) - Laryngeal cancer (HCC)    THERAPY DIAG:  Cervicalgia  Abnormal posture  Muscle weakness (generalized)  Rationale for Evaluation and Treatment: Rehabilitation  ONSET DATE: 1.5 year history (trach placement)  SUBJECTIVE:                                                                                                                                                                                                         SUBJECTIVE STATEMENT: I just woke up so I'm having difficulty holding my head upright today.   From Eval: Pt presents to PT with Rt sided neck tension and shortened tissue secondary to radiation.  Pt has difficulty holding up his head due to pain, postural weakness, and shortened tissue on the Rt due to radiation.  Pt was discharged from PT in August due to clot in Rt jugular vein.  Clot is now resolved.  He reports that he has been stretching and working on posture at home.   Hand dominance: Right  PERTINENT HISTORY:  Tracheostomy, radiation to neck, malignant neoplasm of supraglottis, thrombosis of Rt internal jugular vein, torticollis, heart failure  PAIN: 02/26/2023 Are you having pain? Yes: NPRS scale: 0/10, just stiff Pain location: Rt neck Pain description: stiff, throbbing Aggravating factors: overstretching, trying to stretch  Relieving factors: massaging, medication  PRECAUTIONS: Other: cancer, trach  WEIGHT BEARING RESTRICTIONS: No  FALLS:  Has patient fallen in last 6 months? No  LIVING ENVIRONMENT: Lives with: lives alone Lives in: House/apartment   PLOF: Independent, walking to mailbox  PATIENT GOALS: reduce neck pain, improve mobility   NEXT MD VISIT:  January 2025  OBJECTIVE:   DIAGNOSTIC FINDINGS:  None   PATIENT SURVEYS:  NDI 15/50  COGNITION: Overall cognitive status: Within functional limits for tasks assessed  SENSATION: WFL  POSTURE: rounded shoulders, forward head, increased thoracic kyphosis, and flexed  trunk   PALPATION: Significant tension and reduced muscle length in bil neck. Fixed cervical spine segments with minimal mobility allowed.    CERVICAL ROM:   Active ROM A/PROM (deg) 01/13/23  Flexion Rests in 30 degrees flexion  Extension   Right lateral flexion Rests in 30 degrees  Left lateral flexion 10  degrees Rt lateral flexion  Right rotation 50 degrees   Left rotation 25 degrees    (Blank rows = not tested)  UPPER EXTREMITY ROM:  UE A/ROM is WFLs without pain  UPPER EXTREMITY MMT: 4+/5 bil UE strength, neck strength 4-/5 throughout   TODAY'S TREATMENT:                                                                                                                              DATE:  02/26/2023 Arm Bike: Level 1 66min/2min, then 1 min/1 min-focus on keeping head upright Shoulder flexion and scaption: 2# 2x10 bil each Seated horizontal abduction: red band 2x10 Trigger Point Dry Needling Subsequent Treatment: Instructions reviewed, if requested by the patient, prior to subsequent dry needling treatment.  Patient Verbal Consent Given: Yes Education Handout Provided: Yes Muscles Treated: bil upper traps, bil cervical multifidi   Treatment Response/Outcome: improved tissue mobility and twitch response    Manual: elongation and release to Rt>Lt neck after DN   02/19/2023 Arm Bike: Level 1 58min/2min, then 1 min/1 min-focus on keeping head upright Seated scapular Retraction x 10 5 sec holds Seated red band rows: 2x10 Seated shoulder flexion and scaption 1# 2x10 Open book stretch x10 bil each Standing shoulder abduction & ER with red TB 2 x 10 Manual: Passive stretch into Lt sidebending and rotation, elongation to soft tissue to improve movement.   02/11/2023 Seated scapular Retraction x 10 5 sec holds Arm Bike: Level 1 35min/2min, then 1 min/1 min-focus on keeping head upright Seated shoulder flexion and scaption 1# 2x10 Open book stretch x10 bil each Manual: Passive  stretch into Lt sidebending and rotation, elongation to soft tissue to improve movement.   DATE:  02/05/2023 Seated scapular Retraction x 10 5 sec holds Arm Bike: Level 1 x 4 minutes-1 min forward, rest, 1 min reverse, rest and repeat.-focus on keeping head upright Standing red band: shoulder extension 2x10 Seated red band rows: 2x10 Trigger Point Dry Needling  Subsequent Treatment: Instructions reviewed, if requested by the patient, prior to subsequent dry needling treatment.   Patient Verbal Consent Given: Yes Education Handout Provided: Yes Muscles Treated: bil upper traps, bil cervical paraspinals  Electrical Stimulation Performed: No Treatment Response/Outcome: improved tissue mobility and twitch response    Manual: elongation and release to Rt>Lt neck after DN       PATIENT EDUCATION:  Education details: Access Code: [email or web address] Person educated: Patient Education method: Explanation, Demonstration, and Handouts Education comprehension: verbalized understanding and returned demonstration  HOME EXERCISE PROGRAM: Access Code: [email or web address] URL: [email or web address] Date: 02/19/2023 Prepared by: Claude Manges  Exercises - Seated Scapular Retraction  - 5 x daily - 7 x weekly - 1 sets - 10 reps - 5 hold - Supine Scapular Retraction  - 3 x daily - 7 x weekly - 1 sets - 10 reps - 5 hold - Seated Correct Posture  - 1 x daily - 7 x weekly - 3 sets -  10 reps - Seated Cervical Retraction  - 1 x daily - 7 x weekly - 3 sets - 10 reps - Supine Chin Tuck  - 1 x daily - 7 x weekly - 3 sets - 10 reps - Seated Cervical Sidebending AROM  - 3 x daily - 7 x weekly - 1 sets - 10 reps - 5-10 hold - Seated Assisted Cervical Rotation with Towel  - 5 x daily - 7 x weekly - 1 sets - 3 reps - 10 hold - Shoulder External Rotation and Scapular Retraction with Resistance  - 1 x daily - 7 x weekly - 2 sets - 10 reps - Standing Shoulder Row with Anchored Resistance  - 1 x daily - 7 x weekly - 2  sets - 10 reps - Shoulder extension with resistance - Neutral  - 1 x daily - 7 x weekly - 2 sets - 10 reps  ASSESSMENT:  CLINICAL IMPRESSION: Pt was late so shorted treatment today. Today's treatment session focused on periscapular strengthening and tissue mobility. Patient tolerated treatment session well.Good twitch response and improved tissue mobility with dry needling.  He required verbal and visual cues for head alignment as this was more challenging for him today due to fatigue. PT discussed ways that he can incorporate head posture into his daily tasks for more consistency.  Patient will benefit from skilled PT to address the below impairments and improve overall function.    OBJECTIVE IMPAIRMENTS: decreased activity tolerance, increased muscle spasms, impaired flexibility, postural dysfunction, and pain.   ACTIVITY LIMITATIONS: sitting, standing, and reach over head  PARTICIPATION LIMITATIONS: meal prep, driving, and community activity  PERSONAL FACTORS: 1-2 comorbidities: cancer on pallative care, chronic postural dysfunction  are also affecting patient's functional outcome.   REHAB POTENTIAL: Good  CLINICAL DECISION MAKING: Stable/uncomplicated  EVALUATION COMPLEXITY: Moderate   GOALS: Goals reviewed with patient? Yes  SHORT TERM GOALS: Target date: 02/10/2023      Be independent in initial  HEP Baseline:  Goal status: INITIAL  2.  Verbalize and demonstrate postural corrections to improve tolerance for neutral posture and reduced neck strain  Baseline:  Goal status: INITIAL   LONG TERM GOALS: Target date: 03/10/2023    Be independent in advanced HEP Baseline:  Goal status: INITIAL  3.  Improve NDI to < or = to 7/50 Baseline: 15/50 Goal status: INITIAL  3.   Perform independent postural correction and hold this x 1-2 minutes  Baseline: needs visual feedback and can hold <30seconds  Goal status: INITIAL   4.   Report > or = to 40% reduction in neck pain  due to reduced tension and strain in neck Baseline: constant strain with sitting upright  Goal status: INITIAL    PLAN:  PT FREQUENCY: 1x/week  PT DURATION: 8 weeks  PLANNED INTERVENTIONS: Therapeutic exercises, Therapeutic activity, Neuromuscular re-education, Balance training, Gait training, Patient/Family education, Self Care, Joint mobilization, Aquatic Therapy, Dry Needling, Electrical stimulation, Spinal mobilization, Cryotherapy, Moist heat, Taping, Traction, Manual therapy, and Re-evaluation  PLAN FOR NEXT SESSION: assess new HEP; Continue work on postural reeducation, passive stretching, manual to address muscle tension, DN    Lorrene Reid, PT 02/26/23 8:48 AM  [facility] [address] [place], [place] [address] Phone # [phone] Fax [phone]

## 2023-03-03 ENCOUNTER — Other Ambulatory Visit: Payer: Self-pay | Admitting: Student

## 2023-03-03 ENCOUNTER — Ambulatory Visit (HOSPITAL_COMMUNITY)
Admission: RE | Admit: 2023-03-03 | Discharge: 2023-03-03 | Disposition: A | Discharge status: Home / Self Care | Payer: MEDICAID | Source: Ambulatory Visit | Attending: Acute Care | Admitting: Acute Care

## 2023-03-03 DIAGNOSIS — Z930 Tracheostomy status: Secondary | ICD-10-CM

## 2023-03-03 DIAGNOSIS — R627 Adult failure to thrive: Secondary | ICD-10-CM | POA: Diagnosis present

## 2023-03-03 DIAGNOSIS — Z430 Encounter for attention to tracheostomy: Secondary | ICD-10-CM | POA: Diagnosis not present

## 2023-03-03 DIAGNOSIS — C329 Malignant neoplasm of larynx, unspecified: Secondary | ICD-10-CM | POA: Diagnosis present

## 2023-03-03 DIAGNOSIS — L8990 Pressure ulcer of unspecified site, unspecified stage: Secondary | ICD-10-CM | POA: Diagnosis present

## 2023-03-03 DIAGNOSIS — M436 Torticollis: Secondary | ICD-10-CM | POA: Diagnosis not present

## 2023-03-03 DIAGNOSIS — I82C11 Acute embolism and thrombosis of right internal jugular vein: Secondary | ICD-10-CM | POA: Diagnosis present

## 2023-03-03 DIAGNOSIS — E43 Unspecified severe protein-calorie malnutrition: Secondary | ICD-10-CM | POA: Diagnosis present

## 2023-03-03 DIAGNOSIS — T85898A Other specified complication of other internal prosthetic devices, implants and grafts, initial encounter: Secondary | ICD-10-CM | POA: Insufficient documentation

## 2023-03-03 DIAGNOSIS — J386 Stenosis of larynx: Secondary | ICD-10-CM | POA: Diagnosis present

## 2023-03-03 NOTE — Progress Notes (Signed)
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 Has had slow but steady overall decline over the summer. Specifically significant adult FTT w/ECOG 3. He remains hospice appropriate in my opinion. Still very debilitated, malnourished, his neck contraction is worse.This being said aside from the neck contracture being worse his appetite has been a little better over last few weeks and his overall mood improved. He presents today for planned trach change. Of note on our last visit I placed a # 7 biovona in place of his 4 shiley as he was having discomfort from the trach flange. He liked this more but did make some mild modifications to his trach (cut the larger part of the flange off and punched new hole in flange so that it did not rub up against his neck contracture, he has also been using left over #4 shiley disposable inner cannulas (the biovona is designed cuffless) and also had modified the trach length to be the same as the #4 inner cannula (cut about 1.5 cm off distal end of trach) because otherwise sputum would occlude between the end of the internal cannula and the distal end of trach.  Presents today for planned trach change   Review of Systems  Constitutional:  Negative for fever and weight loss.  HENT: Negative.    Eyes: Negative.   Respiratory: Negative.    Cardiovascular: Negative.   Gastrointestinal: Negative.   Genitourinary: Negative.   Skin:        Some discomfort noted on the right side of his stoma d/t trach flange but this has improved greatly  Neurological: Negative.   Endo/Heme/Allergies: Negative.     Exam  General this is a 48 year old male. He is well known to me. Presents for planned trach change  HENT his neck contracture with this right ear touching his right shoulder seems more pronounced to me. Pain is tolerable. He can move it a little spontaneously approx 10 degrees towards midline.  The stage 1 ulceration around the stoma has resolved Phonation quality about the same. Raspy but can be understood Pulm scattered occ rhonchi Card rrr Abd soft Ext warm  Neuro intact  Procedure: the # 7 portex was removed. Modifications were made to the new trach to make it identical to one we removed. Then using clean technique the new # 7 portex was placed over obturator. Placement verified via ETCO2. Pt tolerated well. 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 He is not a candidate for decannulation.   He was switched  to 7 portex with # 6mm inner diameter from 4 shiley due to his marked neck contracture developing pressure ulcer on the left of stoma. He at home had modified the trach flange on the right sided so that it didn't rub on his neck, and modified the lumen length so that he can use an internal shiley #  4 disposable which he has in surplus so that he can clean the inner cannula and not have to have trach removed every time he plugs which is a chronic problem for him. We discussed at length that modification of his trach would void any warrantee and does increase the risk of failure (I worry mostly about the integrity of the inner cannula) but he has tolerated this modification for over a month, is doing better with it, and he will continue to make this modification at home regardless if we modify it in the clinic. He understands this risk and accepts it.   Plan ROV 4  weeks Cont PMV as tolerated HME as able Continue w/ his modified trach. Not really good alternatives given his neck contracture. 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 Drinks protein shake w/ addition of fruit, eats eggs, chicken and tuna salad.  Plan Continue current diet.    Torticollis  -looks a little worse. Happy to see he is back w/ PT. I don't have a lot of confidence that this will get better but I am hoping at least w/ the dry needle therapy and exercises it will at least not get worse and pain will be manageable  Plan Cont PT    My time 39 minutes    Simonne Martinet ACNP-BC [facility] Pager # [phone] OR # [phone] if no answer

## 2023-03-03 NOTE — Progress Notes (Signed)
Tracheostomy Procedure Note  Joshuan Bolander [id number] 1975-07-24  Pre Procedure Tracheostomy Information  Trach Brand: Portex Size:  60A160 Style: Uncuffed Secured by: Velcro   Procedure: Trach change    Post Procedure Tracheostomy Information  Trach Brand: Portex Size:  60A160 Style: Uncuffed Secured by: Velcro   Post Procedure Evaluation:  ETCO2 positive color change from yellow to purple : Yes.   Vital signs: pulse oximetry 100 % Patients current condition: stable Complications: No apparent complications Trach site exam: clean, dry, no drainage Wound care done: dry, saline soaked, sterile, clean, and 4 x 4 gauze Patient did tolerate procedure well.   Education: Done  Prescription needs: None    Additional needs: New PMV given

## 2023-03-04 ENCOUNTER — Other Ambulatory Visit: Payer: Self-pay

## 2023-03-04 ENCOUNTER — Ambulatory Visit: Payer: MEDICAID | Attending: Radiation Oncology

## 2023-03-04 DIAGNOSIS — R490 Dysphonia: Secondary | ICD-10-CM | POA: Insufficient documentation

## 2023-03-04 DIAGNOSIS — R1312 Dysphagia, oropharyngeal phase: Secondary | ICD-10-CM | POA: Diagnosis present

## 2023-03-04 NOTE — Patient Instructions (Addendum)
   We talked today and you have been doing very well with demonstrating how to do your exercises, and when you eat, being very careful with taking double/triple swallows, small bites and sips, and keeping only liquids, or solids in your mouth at one time. You are doing a great job at this time and keep up the good work. We agreed you are doing good enough for me to discharge you today! If you have any of the sx below, contact Dr. Izell, or go to an urgent care or the ER ASAP.  Signs of Aspiration Pneumonia   Chest pain/tightness Fever (can be low grade) Cough  With foul-smelling phlegm (sputum) With sputum containing pus or blood With greenish sputum Fatigue  Shortness of breath  Wheezing

## 2023-03-04 NOTE — Therapy (Signed)
 OUTPATIENT SPEECH LANGUAGE PATHOLOGY SWALLOW TREATMENT/DISCHARGE   Patient Name: David [name] MRN: [id number] DOB:[birth year], 48 y.o., male Today's Date: 03/04/2023  PCP: Kandis Perkins, MD REFERRING PROVIDER: Izell Domino, MD  END OF SESSION:  End of Session - 03/04/23 0811     Visit Number 7    Number of Visits 9    Date for SLP Re-Evaluation 03/14/23    SLP Start Time 0810   pt arrived late at clinic   SLP Stop Time  0845    SLP Time Calculation (min) 35 min    Activity Tolerance Patient tolerated treatment well                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)    Pneumococcal bacteremia 06/09/2021   Pneumothorax    Left lung spontaneous pneumothorax at age 28 yr    S/P percutaneous endoscopic gastrostomy (PEG) tube placement (HCC) 06/24/2021   Past Surgical History:  Procedure Laterality Date   chest tubes Left    IR GASTROSTOMY TUBE MOD SED  06/18/2021   LAPAROSCOPIC INSERTION GASTROSTOMY TUBE N/A 06/20/2021   Procedure: LAPAROSCOPIC INSERTION GASTROSTOMY TUBE;  Surgeon: Signe Mitzie LABOR, MD;  Location: [facility] OR;  Service: General;  Laterality: N/A;  DOW PATIENT   TRACHEOSTOMY TUBE PLACEMENT N/A 06/09/2021   Procedure: AWAKE TRACHEOSTOMY;  Surgeon: Carlie Clark, MD;  Location: [facility] OR;  Service: ENT;  Laterality: N/A;   Patient Active Problem List   Diagnosis Date Noted   Subclinical hypothyroidism 02/10/2023   FTT (failure to thrive) in adult 12/31/2022    Dysphagia, unspecified 12/31/2022   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 12/31/2022   Hepatitis A immune 11/14/2022   End of life care 10/23/2022   Acute purulent bronchitis 10/23/2022   Hepatitis C 10/09/2022   Left leg pain 10/09/2022   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   History of pancytopenia 09/12/2022   Torticollis 07/04/2022   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   Defective dental restoration 12/31/2021   Chronic periodontitis 12/31/2021   Impacted third molar tooth 12/31/2021   Irritant contact dermatitis associated with digestive stoma 12/25/2021   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   Continuous tobacco abuse 11/21/2021   Dermatitis associated with moisture 10/16/2021   Back pain 08/16/2021   Candidal endocarditis    History of radiation to head and neck region 07/11/2021   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   Dysgeusia 07/11/2021   Pressure injury of skin 06/17/2021   Tracheostomy status (HCC)    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   Heart failure with reduced ejection fraction (HCC) 04/28/2021   Anxiety 04/28/2021   Elevated troponin 04/01/2021   Elevated transaminase level 04/01/2021   Weight loss, unintentional 01/15/2021   Protein-calorie malnutrition, severe (HCC) 01/10/2021   Hypotension 01/08/2021   Chemotherapy induced nausea and vomiting 01/01/2021   Encounter for dental examination 12/13/2020   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   Teeth missing 12/04/2020   Radiation caries 12/04/2020  Retained tooth root 12/04/2020   Chronic apical periodontitis 12/04/2020   Accretions on teeth 12/04/2020   SPEECH THERAPY DISCHARGE SUMMARY  Visits from Start of Care: 7  Current functional level related to goals / functional outcomes: See goals below. Pt reports he completes his HEP with regularity, he does not show any overt s/sx aspiration PNA but has recited s/sx to SLP with mod I. He  voices that he is taking more ownership of his success with keeping PNA away by doing exercises and by following swallow precautions during ST sessions. Because pt has no overt s/sx aspiration PNA SLP can only assume pt is following precautions outside ST room.   Remaining deficits: Oropharyngeal dysphagia. Voice disorder/hoarseness.   Education / Equipment: See therapy notes for more details.    Patient agrees to discharge. Patient goals were partially met. Patient is being discharged due to being pleased with the current functional level..     ONSET DATE: 2023   REFERRING DIAG:  C32.9 (ICD-10-CM) - Laryngeal cancer (HCC)    THERAPY DIAG:  Dysphagia, oropharyngeal phase  Hoarseness of voice  Rationale for Evaluation and Treatment: Rehabilitation  SUBJECTIVE:   SUBJECTIVE STATEMENT: The trach guy replaced the trach yesterday but it didn't fit a canula. Pt accompanied by: self  PERTINENT HISTORY: Pt well known to this clinician from previous plans of care. HPI: s/p chemo and XRT. Has had slow but steady overall decline over the summer. Specifically significant adult FTT, torticollis with right ear touching right shoulder, limited ROM of neck and dependent lymphedema right face, mild stage 1 pressure/breakdown to left of trach stoma. He is followed by Jeralyn Banner at trach clinic, most recently 12/31/22; trach changed to Portex Bivona size 7. Significant air trapping with capping valve, not a candidate for decannulation. Hx trach 06/09/21, PEG 06/20/21, subsequently fell out per his report. Most recent MBS 07/25/21: mild pharyngeal residuals, head turn to right, cough/re-swallow helped with airway protection. Aspiration of thins. Regular/thins recommended. OP SLP from 9-12/23. Pt reports worsening dysphagia with coughing, occasional nasal regurgitation. MBS completed 01/10/23, results below.   PAIN:  Are you having pain? No  FALLS: Has patient fallen in last 6 months?  Yes;  one  PATIENT GOALS: get this swallowing better  OBJECTIVE:  Note: Objective measures were completed at Evaluation unless otherwise noted. 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  Quality of imaging was compromised by shoulder/neck positioning and it was difficult to visualize anatomical markers. There were no incidents of nasal regurgitation on today's study (reported to be happening more frequently). There was minimal mobility of the larynx and incomplete laryngeal closure, leading to intermittent aspiration of thin and nectar thick liquids, occurring before and after the swallow response. There was frequent tongue pumping and effort to propel material from mouth through pharynx, leaving residuals in posterior oral cavity with multiple f/u swallows needed to move bolus into throat. There was minimal pharyngeal residue post-swallow.    Objective recommended compensations:  Recommendations: PO diet PO Diet Recommendation: Regular; Thin liquids (Level 0) Medication Administration: Whole meds with puree Supervision: Patient able to self-feed Swallowing strategies  : Place PMSV during PO intake Oral care recommendations: Oral care QID (4x/day)  PATIENT REPORTED OUTCOME MEASURES (PROM): EAT-10: 7/40, with higher scores indicating that swallowing is negatively affecting pt's QOL.  TODAY'S TREATMENT:                                                                                                                                         DATE:  03/04/23: [name] shares that his weight is up. SLP congratulated him on this.  He tells SLP his nasal regurgitation is better/almost negligible when he takes smaller bites and eats slower. SLP encouraged him as  this is best for him anyway at this time. With 8 bites and multiple sips with POs today audible swallow noted with larger bites dys III (fig bar) and occasionally with water .  He was again provided overt s/sx aspiration PNA. Today [name] and SLP talked about discharge as pt is able to complete HEP without cues, and appears safe with dys III and thin liquids when he follows precautions SLP suggested to him. [name] demo'd knowledge of rationale for oral care after eating. Pt's final EAT-10 was 12/40 which is slightly higher than initial. This may be due to pt's incr'd awareness of swallowing difficulty.  02/19/23: I take small bites and sips and that helps to keep it from getting stuck in my nose. Dr Lauralee said the food in my nose is going to happen. Today pt had sausage and gravy, and has had beefaroni, chicken salad sandwiches in the last few days. Today he ate a strawberry fig bar without reported nasal regurgitation. He adhered to adequately small bites. Water  consumption was via straw and he did not have any overt s/sx pharyngeal deficits with water  or with fig bar. Multiple dry swallows were completed for each bite. He waited for oral clearance of bolus before having next bite or sip.   Pt states he has been doing the full scope of his swallow exercises, approx twice a day. He tells SLP he has been doing two sets of 10. His procedure with HEP today was actually WNL. SLP encouraged pt to complete 25 or 30 reps/day. Overt s/sx PNA were shared with pt today and told to go to ED or urgent care ASAP if he has >1 of these sx at one time.  02/11/23: I hope the cancer hasn't come back. I have some kind of a scan and a biopsy (on 02/18/23). Pt entered with a clear voice. Pt performed HEP with rare min A (tongue protrusion on [place]). [name] again stated he was performing 10 reps per day for each exercise of the HEP; SLP reminded pt again about 20 reps minimum of each exercise, and provided rationale for this. He  ate fig bar and drank water  today without spontaneously following precautions. After SLP reminded pt of precautions he followed them. No overt s/sx of aspiration today with POs; pt's voice remained clear during and after POs.   02/03/23: EAT-10 completed today. See above. Pt's neck positioning does not appear improved from previous session. Talked with pt about/reiterated importance of 20 reps  of each exercise, as pt reported he was completing approx 10 each/day. As he said, I'm doing 10 plus all my eating, SLP reminded [name] that exercises were in addition to any swallowing that he would do with eating. Procedure was independent.  Pt ate a fig bar and drank water  with a straw. Wet voice heard approx 25% of the time and pt req'd cues initially to swallow x2-3 with each bite or sip. SLP stressed to pt the need to follow precautions which included multiple swallows, considering eating 4-5 smaller meals per day rather than 2-3 larger meals in order to conserve energy and make eating more safe, and if experiencing nasal regurgitation (not seen on MBS) to take smaller sips and swallow hard. SLP told pt that he is decreasing his RISK for aspiration with completion of the exercises and if he is not completing HEP as directed his is likely not decreasing his risk for aspiration . He voiced understanding. SLP suggested pt cont'd to be seen once a week, [name] asked to be seen once every two weeks.  01/27/23: SLP provided pt with fig bar and water  and pt with hydrophonic voice after 3 bite/sip combinations. SLP told pt to swallow x3 for each bite or sip and he req'd usual min A for this faded to occasional min A. Frequency of hydrophonic voice decr'd after this was suggested by SLP. Pt cont to eat softer foods that don't require as much chewing. SLP again provided pt with overt s/sx aspiration PNA. SLP reiterated pt keep as active as possible with regular walking/exercise to reduce risk of aspiration PNA. SLP also  reminded pt of x4/day thorough oral care. Pt told SLP he was performing oral care x3/day regularly and x4 sometimes.  With HEP, pt req'd cues with sequencing of Supraglottic, but was independent by session end. Other exercises were completed with independence. Pt stated he was getting x10/day of each but stated he was working his way to x20/day for each. SLP encouraged him to stay consistent and he has the best chance for this to occur.   01/20/23: Pt needs EAT-10 next session. He ate biscuits and gravy this morning for breakfast. SLP reiterated soft foods to pt. Pt enters today with visibly soiled shirt with what appears to be dried food or dried phlegm, and a blood stain (small) on jacket. He completed HEP exercises with independence. He has completed all exercises but not 20/day like you said. SLP encouraged pt he may not be able to achieve 20 each exercise, currently, but to do as many as he can at this time and he should be able to incr reps as time goes on. He acknowledged understanding.  Pt removed trach during session today and SLP expressed concern about this. Pt assured SLP he is performing trach cleaning and care each day, and good oral hygiene as well.   01/14/23 (eval): see education below  PATIENT EDUCATION: Education details: See today's treatment above Person educated: Patient Education method: Explanation, Demonstration, Verbal cues, and Handouts Education comprehension: verbalized understanding, returned demonstration, verbal cues required, and needs further education   ASSESSMENT:  CLINICAL IMPRESSION: Patient is a 48 y.o. M who was seen today for treatment of swallow after MBS 01/10/23. Pt is currently being seen for torticollis by PT. See today's treatment for more details of today's session. Today pt followed swallow precautions and his procedure with his prescribed HEP was WNL. Pt and SLP agree on d/c today.   OBJECTIVE IMPAIRMENTS: include voice disorder and  dysphagia. These impairments are limiting patient from effectively communicating at home and in community and safety when swallowing. Factors affecting potential to achieve goals and functional outcome are cooperation/participation level, medical prognosis, previous level of function, severity of impairments, and financial resources. Patient will benefit from skilled SLP services to address above impairments and improve overall function.  REHAB POTENTIAL: Fair given above factors   GOALS: Goals reviewed with patient? Yes  SHORT TERM GOALS: Target date: 02/14/23  Pt will complete HEP with rare min A Baseline: Goal status: Met  2.  Pt will follow swallow strategies from MBS with POs x2 visits Baseline:  Goal status: Not met  3.  Pt will tell SLP 3 overt s/sx aspiration PNA with modified independence in 2 sessions Baseline: 01/20/23 Goal status: met  4.  Pt will tell SLP rationale for thorough oral care in 2 sessions Baseline: 02/11/23 Goal status: Partially met   LONG TERM GOALS: Target date: 03/14/23  Pt will improve PROM compared to initial administration Baseline:  Goal status: Not met  2.  Pt will follow swallow strategies in 1 session after 02/14/23 Baseline:  Goal status: met  3.  Pt will tell SLP 3 overt s/sx aspiration PNA in 2 sessions Baseline: 02/19/23 Goal status: met  4.  Pt will tell SLP rationale for thorough oral care after 02/14/23 Baseline:  Goal status: met  5. Pt will complete HEP with independence in two sessions Baseline: 02/19/23 Goal status: met  PLAN:  Discharge today  PLANNED INTERVENTIONS: [id number] Treatment of swallowing function, Aspiration precaution training, Pharyngeal strengthening exercises, Diet toleration management , Environmental controls, Trials of upgraded texture/liquids, Internal/external aids, SLP instruction and feedback, Compensatory strategies, and Patient/family education    [facility], CCC-SLP 03/04/2023, 8:16 AM For all  possible CPT codes, reference the Planned Interventions line above.     Check all conditions that are expected to impact treatment: {Conditions expected to impact treatment:Respiratory disorders, Contractures, spasticity or fracture relevant to requested treatment, and Social determinants of health   If treatment provided at initial evaluation, no treatment charged due to lack of authorization.

## 2023-03-05 ENCOUNTER — Other Ambulatory Visit: Payer: Self-pay | Admitting: Nurse Practitioner

## 2023-03-05 ENCOUNTER — Ambulatory Visit: Payer: MEDICAID | Attending: Radiation Oncology

## 2023-03-05 DIAGNOSIS — M62838 Other muscle spasm: Secondary | ICD-10-CM

## 2023-03-05 DIAGNOSIS — M542 Cervicalgia: Secondary | ICD-10-CM | POA: Diagnosis present

## 2023-03-05 DIAGNOSIS — R293 Abnormal posture: Secondary | ICD-10-CM | POA: Diagnosis present

## 2023-03-05 DIAGNOSIS — G893 Neoplasm related pain (acute) (chronic): Secondary | ICD-10-CM

## 2023-03-05 DIAGNOSIS — C329 Malignant neoplasm of larynx, unspecified: Secondary | ICD-10-CM

## 2023-03-05 DIAGNOSIS — M6281 Muscle weakness (generalized): Secondary | ICD-10-CM | POA: Diagnosis present

## 2023-03-05 DIAGNOSIS — Z515 Encounter for palliative care: Secondary | ICD-10-CM

## 2023-03-05 DIAGNOSIS — F419 Anxiety disorder, unspecified: Secondary | ICD-10-CM

## 2023-03-05 MED ORDER — PREGABALIN 20 MG/ML PO SOLN: 40.0000 mg | Freq: Three times a day (TID) | ORAL | 1 refills | Status: DC

## 2023-03-05 MED ORDER — DIAZEPAM 5 MG/5ML PO SOLN: 2.0000 mL | Freq: Three times a day (TID) | ORAL | 0 refills | Status: DC | PRN

## 2023-03-05 NOTE — Therapy (Signed)
 OUTPATIENT PHYSICAL THERAPY CERVICAL TREATMENT   Patient Name: David [name] MRN: [id number] DOB:[birth year], 48 y.o., male Today's Date: 03/05/2023  END OF SESSION:  PT End of Session - 03/05/23 0836     Visit Number 7    Date for PT Re-Evaluation 03/10/23    Authorization Type trillium approved 8 visits 01/20/2023-03/10/2023 [id number]    Authorization Time Period 01/20/2023-03/10/2023    Authorization - Visit Number 6    Authorization - Number of Visits 8    PT Start Time 0752    PT Stop Time 0835    PT Time Calculation (min) 43 min    Activity Tolerance Patient tolerated treatment well    Behavior During Therapy [facility] for tasks assessed/performed                    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)    Pneumococcal bacteremia 06/09/2021   Pneumothorax    Left lung spontaneous pneumothorax at age 64 yr    S/P percutaneous endoscopic gastrostomy (PEG) tube placement (HCC) 06/24/2021   Past Surgical History:  Procedure Laterality Date   chest tubes Left    IR GASTROSTOMY TUBE MOD SED  06/18/2021   LAPAROSCOPIC INSERTION GASTROSTOMY TUBE N/A 06/20/2021   Procedure: LAPAROSCOPIC INSERTION GASTROSTOMY TUBE;  Surgeon: Signe Mitzie LABOR, MD;  Location: [facility] OR;  Service: General;  Laterality: N/A;  DOW PATIENT   TRACHEOSTOMY TUBE PLACEMENT N/A 06/09/2021   Procedure: AWAKE TRACHEOSTOMY;  Surgeon: Carlie Clark, MD;  Location: [facility] OR;  Service: ENT;  Laterality: N/A;    Patient Active Problem List   Diagnosis Date Noted   Subclinical hypothyroidism 02/10/2023   FTT (failure to thrive) in adult 12/31/2022   Dysphagia, unspecified 12/31/2022   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 12/31/2022   Hepatitis A immune 11/14/2022   End of life care 10/23/2022   Acute purulent bronchitis 10/23/2022   Hepatitis C 10/09/2022   Left leg pain 10/09/2022   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   History of pancytopenia 09/12/2022   Torticollis 07/04/2022   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   Defective dental restoration 12/31/2021   Chronic periodontitis 12/31/2021   Impacted third molar tooth 12/31/2021   Irritant contact dermatitis associated with digestive stoma 12/25/2021   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   Continuous tobacco abuse 11/21/2021   Dermatitis associated with moisture 10/16/2021   Back pain 08/16/2021   Candidal endocarditis    History of radiation to head and neck region 07/11/2021   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   Dysgeusia 07/11/2021   Pressure injury of skin 06/17/2021   Tracheostomy status (HCC)    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   Heart failure with reduced ejection fraction (HCC) 04/28/2021   Anxiety 04/28/2021   Elevated troponin 04/01/2021   Elevated transaminase level 04/01/2021   Weight loss, unintentional 01/15/2021   Protein-calorie malnutrition, severe (HCC) 01/10/2021   Hypotension 01/08/2021   Chemotherapy induced nausea and vomiting  01/01/2021   Encounter for dental examination 12/13/2020   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   Teeth missing 12/04/2020   Radiation caries 12/04/2020   Retained tooth root 12/04/2020   Chronic apical periodontitis 12/04/2020   Accretions on teeth 12/04/2020    PCP: Griffith Porter, MD   REFERRING PROVIDER: [place] Fannie SAILOR, NP   REFERRING DIAG: Diagnosis Information  Diagnosis  C32.9  (ICD-10-CM) - Laryngeal cancer (HCC)    THERAPY DIAG:  Cervicalgia  Abnormal posture  Muscle weakness (generalized)  Rationale for Evaluation and Treatment: Rehabilitation  ONSET DATE: 1.5 year history (trach placement)  SUBJECTIVE:                                                                                                                                                                                                         SUBJECTIVE STATEMENT: I have been doing my exercises.  It seems like it is easier to keep my head up, but it is hard to keep it straight.  I can see over the dashboard now in the car.    From Eval: Pt presents to PT with Rt sided neck tension and shortened tissue secondary to radiation.  Pt has difficulty holding up his head due to pain, postural weakness, and shortened tissue on the Rt due to radiation.  Pt was discharged from PT in August due to clot in Rt jugular vein.  Clot is now resolved.  He reports that he has been stretching and working on posture at home.   Hand dominance: Right  PERTINENT HISTORY:  Tracheostomy, radiation to neck, malignant neoplasm of supraglottis, thrombosis of Rt internal jugular vein, torticollis, heart failure  PAIN: 03/05/2023 Are you having pain? Yes: NPRS scale: 0/10, just stiff Pain location: Rt neck Pain description: stiff, throbbing Aggravating factors: overstretching, trying to stretch  Relieving factors: massaging, medication  PRECAUTIONS: Other: cancer, trach  WEIGHT BEARING RESTRICTIONS: No  FALLS:  Has patient fallen in last 6 months? No  LIVING ENVIRONMENT: Lives with: lives alone Lives in: House/apartment   PLOF: Independent, walking to mailbox  PATIENT GOALS: reduce neck pain, improve mobility   NEXT MD VISIT:  January 2025  OBJECTIVE:   DIAGNOSTIC FINDINGS:  None   PATIENT SURVEYS:  NDI 15/50  COGNITION: Overall cognitive status: Within functional limits for tasks  assessed  SENSATION: WFL  POSTURE: rounded shoulders, forward head, increased thoracic kyphosis, and flexed trunk   PALPATION: Significant tension and reduced muscle length in bil neck. Fixed cervical spine segments with minimal mobility allowed.    CERVICAL ROM:  Active ROM A/PROM (deg) 01/13/23  Flexion Rests in 30 degrees flexion  Extension   Right lateral flexion Rests in 30 degrees  Left lateral flexion 10 degrees Rt lateral flexion  Right rotation 50 degrees   Left rotation 25 degrees    (Blank rows = not tested)  UPPER EXTREMITY ROM:  UE A/ROM is WFLs without pain  UPPER EXTREMITY MMT: 4+/5 bil UE strength, neck strength 4-/5 throughout   TODAY'S TREATMENT:                                                                                                                              DATE:  02/26/2023 NuStep: Level 4-arms and legs with cues to keep head upright x 8 minutes- PT present to discuss progress Sit to stand with 5# kettlebell chest press 2x10 Shoulder flexion and scaption: 2# 2x10 bil each Standing rows and shoulder extension with red band: 2x10 bil- verbal cues to improve eccentric control  Farmer's carry with 5# kettlebell - 1 lap in each hand   Manual: elongation and release to Rt>Lt neck after DN   02/19/2023 Arm Bike: Level 1 29min/2min, then 1 min/1 min-focus on keeping head upright Seated scapular Retraction x 10 5 sec holds Seated red band rows: 2x10 Seated shoulder flexion and scaption 1# 2x10 Open book stretch x10 bil each Standing shoulder abduction & ER with red TB 2 x 10 Manual: Passive stretch into Lt sidebending and rotation, elongation to soft tissue to improve movement.   02/11/2023 Seated scapular Retraction x 10 5 sec holds Arm Bike: Level 1 64min/2min, then 1 min/1 min-focus on keeping head upright Seated shoulder flexion and scaption 1# 2x10 Open book stretch x10 bil each Manual: Passive stretch into Lt sidebending and rotation,  elongation to soft tissue to improve movement.   DATE:  02/05/2023 Seated scapular Retraction x 10 5 sec holds Arm Bike: Level 1 x 4 minutes-1 min forward, rest, 1 min reverse, rest and repeat.-focus on keeping head upright Standing red band: shoulder extension 2x10 Seated red band rows: 2x10 Trigger Point Dry Needling  Subsequent Treatment: Instructions reviewed, if requested by the patient, prior to subsequent dry needling treatment.   Patient Verbal Consent Given: Yes Education Handout Provided: Yes Muscles Treated: bil upper traps, bil cervical paraspinals  Electrical Stimulation Performed: No Treatment Response/Outcome: improved tissue mobility and twitch response    Manual: elongation and release to Rt>Lt neck after DN       PATIENT EDUCATION:  Education details: Access Code: [email or web address] Person educated: Patient Education method: Explanation, Demonstration, and Handouts Education comprehension: verbalized understanding and returned demonstration  HOME EXERCISE PROGRAM: Access Code: [email or web address] URL: [email or web address] Date: 03/05/2023 Prepared by: Burnard  Exercises - Seated Scapular Retraction  - 5 x daily - 7 x weekly - 1 sets - 10 reps - 5 hold - Supine Scapular Retraction  - 3 x daily - 7 x weekly - 1 sets - 10 reps - 5 hold -  Seated Correct Posture  - 1 x daily - 7 x weekly - 3 sets - 10 reps - Seated Cervical Retraction  - 1 x daily - 7 x weekly - 3 sets - 10 reps - Supine Chin Tuck  - 1 x daily - 7 x weekly - 3 sets - 10 reps - Seated Cervical Sidebending AROM  - 3 x daily - 7 x weekly - 1 sets - 10 reps - 5-10 hold - Seated Assisted Cervical Rotation with Towel  - 5 x daily - 7 x weekly - 1 sets - 3 reps - 10 hold - Shoulder External Rotation and Scapular Retraction with Resistance  - 1 x daily - 7 x weekly - 2 sets - 10 reps - Standing Shoulder Row with Anchored Resistance  - 1 x daily - 7 x weekly - 2 sets - 10 reps - Shoulder extension with  resistance - Neutral  - 1 x daily - 7 x weekly - 2 sets - 10 reps - Sit to Stand Without Arm Support  - 2 x daily - 7 x weekly - 2 sets - 5 reps  ASSESSMENT:  CLINICAL IMPRESSION: Pt is making functional gain and is able to progress to more activity with emphasis on posture, core and head alignment.  He is now able to see over the dash board in the car and is able to maintain upright head position with walking and exercise tasks in the clinic.  Pt still with Rt lateral flexion, however he is able to keep his eyes upright.  PT discussed ways that he can incorporate head posture into his daily tasks for more consistency.  Patient will benefit from skilled PT to address the below impairments and improve overall function.    OBJECTIVE IMPAIRMENTS: decreased activity tolerance, increased muscle spasms, impaired flexibility, postural dysfunction, and pain.   ACTIVITY LIMITATIONS: sitting, standing, and reach over head  PARTICIPATION LIMITATIONS: meal prep, driving, and community activity  PERSONAL FACTORS: 1-2 comorbidities: cancer on pallative care, chronic postural dysfunction  are also affecting patient's functional outcome.   REHAB POTENTIAL: Good  CLINICAL DECISION MAKING: Stable/uncomplicated  EVALUATION COMPLEXITY: Moderate   GOALS: Goals reviewed with patient? Yes  SHORT TERM GOALS: Target date: 02/10/2023      Be independent in initial  HEP Baseline:  Goal status: INITIAL  2.  Verbalize and demonstrate postural corrections to improve tolerance for neutral posture and reduced neck strain  Baseline:  Goal status: INITIAL   LONG TERM GOALS: Target date: 03/10/2023    Be independent in advanced HEP Baseline:  Goal status: INITIAL  3.  Improve NDI to < or = to 7/50 Baseline: 15/50 Goal status: INITIAL  3.   Perform independent postural correction and hold this x 1-2 minutes  Baseline: needs visual feedback and can hold <30seconds  Goal status: INITIAL   4.    Report > or = to 40% reduction in neck pain due to reduced tension and strain in neck Baseline: constant strain with sitting upright  Goal status: INITIAL    PLAN:  PT FREQUENCY: 1x/week  PT DURATION: 8 weeks  PLANNED INTERVENTIONS: Therapeutic exercises, Therapeutic activity, Neuromuscular re-education, Balance training, Gait training, Patient/Family education, Self Care, Joint mobilization, Aquatic Therapy, Dry Needling, Electrical stimulation, Spinal mobilization, Cryotherapy, Moist heat, Taping, Traction, Manual therapy, and Re-evaluation  PLAN FOR NEXT SESSION: ERO next session, add functional goals for posture and endurance    Burnard Joy, PT 03/05/23 8:38 AM  [facility]  [address] [place], [place] [address] Phone # [phone] Fax [phone]

## 2023-03-07 NOTE — Progress Notes (Deleted)
 Palliative Medicine [facility]  [phone] [phone]   Name: David [name] Date: 03/07/2023 MRN: [id number]  DOB: November 26, 1975  Patient Care Team: Faith Rogue, DO as PCP - General (Internal Medicine) [name], Lise Auer, RN as Oncology Nurse Navigator [name], Skeet Latch, DO as Consulting Physician (Otolaryngology) Lonie Peak, MD as Consulting Physician (Radiation Oncology) Rachel Moulds, MD as Consulting Physician (Hematology and Oncology) Pickenpack-Cousar, Arty Baumgartner, NP as Nurse Practitioner (Nurse Practitioner)   REASON FOR CONSULTATION: David [name] is a 48 y.o. 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 He was recently hospitalized for several months at [facility] where he received treatment for fungal endocarditis and required emergent tracheostomy and PEG tube. During hospitalization CT of chest showed concerns for residual vs recurrent tumor. Palliative requested to re-engage for ongoing support and goals of care.   SOCIAL HISTORY:     reports that he has been smoking cigarettes. He has never used smokeless tobacco. He reports that he does not currently use alcohol after a past usage of about 12.0 standard drinks of alcohol per week. He reports that he does not currently use drugs after having used the following drugs: IV.  ADVANCE DIRECTIVES:  Patient reports he does not have advanced directives.  Would like to further discuss in the future.  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)    Pneumococcal bacteremia 06/09/2021   Pneumothorax    Left lung spontaneous pneumothorax at age 49 yr    S/P percutaneous endoscopic gastrostomy (PEG) tube placement (HCC) 06/24/2021    PAST SURGICAL HISTORY:  Past Surgical History:  Procedure Laterality Date   chest tubes Left    IR GASTROSTOMY TUBE MOD SED  06/18/2021   LAPAROSCOPIC INSERTION GASTROSTOMY TUBE N/A 06/20/2021   Procedure: LAPAROSCOPIC INSERTION GASTROSTOMY TUBE;  Surgeon: Berna Bue, MD;  Location: [facility] OR;  Service: General;  Laterality: N/A;  DOW PATIENT   TRACHEOSTOMY TUBE PLACEMENT N/A 06/09/2021   Procedure: AWAKE TRACHEOSTOMY;  Surgeon: Christia Reading, MD;  Location[facility] OR;  Service: ENT;  Laterality: N/A;      ALLERGIES:  has no known allergies.  MEDICATIONS:  Current Outpatient Medications  Medication Sig Dispense Refill   apixaban (ELIQUIS) 5 MG TABS tablet TAKE 1 TABLET BY MOUTH TWICE A DAY 180 tablet 3   diazePAM 5 MG/5ML SOLN Take 2 mLs (2 mg total) by mouth every 8 (eight) hours as needed (muscle spasms and dysphagia). TAKE 2 MLS (2 MG TOTAL) BY MOUTH EVERY 8 (EIGHT) HOURS AS NEEDED FOR MUSCLE SPASMS (DYSPHAGIA). 100 mL 0   fluconazole (DIFLUCAN) 200 MG tablet Take 800 mg by mouth daily.     Glecaprevir-Pibrentasvir 100-40 MG TABS Take 1 tablet by mouth daily.     lidocaine (XYLOCAINE) 2 % solution Use as directed  15 mLs in the mouth or throat every 3 (three) hours as needed for mouth pain. Mix 1part 2% viscous lidocaine, 1part water. Swish and spit 30mL of total diluted mixture up to eight times a day, prn as needed for soreness 200 mL 2   midodrine (PROAMATINE) 5 MG  tablet TAKE 1 TABLET (5 MG TOTAL) BY MOUTH 3 (THREE) TIMES DAILY WITH MEALS. 270 tablet 2   ondansetron (ZOFRAN) 4 MG tablet Take 1 tablet (4 mg total) by mouth every 8 (eight) hours as needed for nausea or vomiting. 30 tablet 1   [START ON 03/11/2023] Pregabalin 20 MG/ML SOLN Take 40 mg by mouth 3 (three) times daily. 120 mL 1   sertraline (ZOLOFT) 20 MG/ML concentrated solution Mix 1.25 mLs (25 mg total)  with 1/2 cup of water and take by mouth daily. 60 mL 2   No current facility-administered medications for this visit.      Latest Ref Rng & Units 12/13/2022    2:10 PM 11/13/2022    3:40 PM 11/08/2022    8:59 AM  CBC  WBC 4.0 - 10.5 K/uL 4.8  4.0  7.7   Hemoglobin 13.0 - 17.0 g/dL 9.6  30.1  60.1   Hematocrit 39.0 - 52.0 % 31.2  35.6  40.8   Platelets 150 - 400 K/uL 108  160  249        Latest Ref Rng & Units 12/13/2022    2:10 PM 11/29/2022   10:00 AM 11/13/2022    3:40 PM  CMP  Glucose 70 - 99 mg/dL 093  92  49   BUN 6 - 20 mg/dL 16  12  11    Creatinine 0.61 - 1.24 mg/dL 2.35  5.73  2.20   Sodium 135 - 145 mmol/L 135  135  138   Potassium 3.5 - 5.1 mmol/L 3.4  4.0  3.7   Chloride 98 - 111 mmol/L 101  96  97   CO2 22 - 32 mmol/L 28  26  24    Calcium 8.9 - 10.3 mg/dL 8.7  9.1  9.0   Total Protein 6.5 - 8.1 g/dL  7.1  7.1   Total Bilirubin 0.3 - 1.2 mg/dL  0.3  <2.5   Alkaline Phos 38 - 126 U/L  67  81   AST 15 - 41 U/L  31  61   ALT 0 - 44 U/L  17  23     VITAL SIGNS: There were no vitals taken for this visit. There were no vitals filed for this visit.     Estimated body mass index is 14.31 kg/m as calculated from the following:   Height as of 02/10/23: 5\' 10"  (1.778 m).   Weight as of 02/10/23: 99 lb 11.2 oz (45.2 kg).  PERFORMANCE STATUS (ECOG) : 1 - Symptomatic but completely ambulatory  Assessment NAD, thin  RRR Normal breathing pattern, trach in place Right neck contracture, dependent edema Alert and oriented x3  Discussed the use of AI scribe software  for clinical note transcription with the patient, who gave verbal consent to proceed.   IMPRESSION:  David [name] presents to clinic for follow-up. His mother is present. Patient continues to show signs of decline however he feels that his quality of life is sufficient. He is still at his mother's home for support. Actively involved with SLP/PT. States he has an upcoming appointment with [facility] provider. [name] denies nausea, vomiting, constipation, or diarrhea.   The patient's significant concern is his  nasal discharge. States food will come out of his nose at times in addition to liquids. His head is contracted to right side and chin to chest.  Recent MBS performed. Despite nasal drainage and regurgitation, he feels his appetite is good and much improved. His weight is up to 100lbs from 88 lbs 11/27.    His pain is well controlled in addition to anxiety. Continues to receive support from Methadone clinic. Tolerating pregabalin and diazepam. No changes to regimen.   The patient expressed a need for assistance with bathing and assistance due to occasional fatigue and nutritional needs. He has been advised to register for home health or CAPS through his Medicaid worker.   All questions answered and support provided.  Assessment & Plan  Nasal Drainage Recurrence of symptoms after eating or drinking. Denies cough. Tracheostomy in place.  -Encouraged ongoing support form SLP and medical providers.  -Education provided on safe eating and drinking to decrease risk of aspiration and ongoing drainage.   Blood Pressure Concerns about an old prescription for blood pressure medication, despite no current diagnosis of hypertension. Blood pressure today is 117/80. -Advised to follow-up with medical provider on use of midodrine.   Weight Gain Noted increase in weight, due to dietary changes. High-caloric intake -Continue current diet and monitor weight.  Home Health Assistance Difficulty with  bathing, laundry, and cleaning due to physical limitations. -Contact Medicaid worker to register for CAPS or home health services.  Speech Therapy Currently attending sessions once every week. Patient states this is being moved to every 2 weeks.  -Continue current speech therapy schedule.  Physical Therapy Starting new therapy for neck. -Continue with planned physical therapy.  Follow-up Upcoming appointment with Dr. Gordy Levan on 02/18/2023. -Discuss possibility of using a patch for nasal drainage with Dr. Gordy Levan. -I will plan to see patient back in 6 weeks. Sooner if needed.    Any controlled substances utilized were prescribed in the context of palliative care. PDMP has been reviewed.    Visit consisted of counseling and education dealing with the complex and emotionally intense issues of symptom management and palliative care in the setting of serious and potentially life-threatening illness.  Willette Alma, AGPCNP-BC  Palliative Medicine Team/[facility]

## 2023-03-10 ENCOUNTER — Ambulatory Visit: Payer: MEDICAID

## 2023-03-10 ENCOUNTER — Inpatient Hospital Stay: Payer: MEDICAID | Attending: Hematology and Oncology | Admitting: Nurse Practitioner

## 2023-03-10 DIAGNOSIS — M542 Cervicalgia: Secondary | ICD-10-CM | POA: Diagnosis not present

## 2023-03-10 DIAGNOSIS — M6281 Muscle weakness (generalized): Secondary | ICD-10-CM

## 2023-03-10 DIAGNOSIS — R293 Abnormal posture: Secondary | ICD-10-CM

## 2023-03-10 NOTE — Therapy (Addendum)
 OUTPATIENT PHYSICAL THERAPY TREATMENT   Patient Name: David [name] MRN: [id number] DOB:03-25-1975, 48 y.o., male Today's Date: 03/10/2023  END OF SESSION:  PT End of Session - 03/10/23 0837     Visit Number 8    Date for PT Re-Evaluation 05/05/23    Authorization Type trillium approved 8 visits 01/20/2023-03/10/2023 [id number]- requested more 03/10/23    Authorization - Visit Number 7    Authorization - Number of Visits 8    PT Start Time 0750    PT Stop Time 0838    PT Time Calculation (min) 48 min    Activity Tolerance Patient tolerated treatment well    Behavior During Therapy [facility] for tasks assessed/performed                     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)    Pneumococcal bacteremia 06/09/2021   Pneumothorax    Left lung spontaneous pneumothorax at age 36 yr    S/P percutaneous endoscopic gastrostomy (PEG) tube placement (HCC) 06/24/2021   Past Surgical History:  Procedure Laterality Date   chest tubes Left    IR GASTROSTOMY TUBE MOD SED  06/18/2021   LAPAROSCOPIC INSERTION GASTROSTOMY TUBE N/A 06/20/2021   Procedure: LAPAROSCOPIC INSERTION GASTROSTOMY TUBE;  Surgeon: Adalberto Acton, MD;  Location: [facility] OR;  Service: General;  Laterality: N/A;  DOW PATIENT   TRACHEOSTOMY TUBE PLACEMENT N/A 06/09/2021   Procedure: AWAKE TRACHEOSTOMY;  Surgeon: Virgina Grills, MD;  Location: [facility] OR;  Service: ENT;  Laterality: N/A;   Patient Active Problem List    Diagnosis Date Noted   Subclinical hypothyroidism 02/10/2023   FTT (failure to thrive) in adult 12/31/2022   Dysphagia, unspecified 12/31/2022   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 12/31/2022   Hepatitis A immune 11/14/2022   End of life care 10/23/2022   Acute purulent bronchitis 10/23/2022   Hepatitis C 10/09/2022   Left leg pain 10/09/2022   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   History of pancytopenia 09/12/2022   Torticollis 07/04/2022   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   Defective dental restoration 12/31/2021   Chronic periodontitis 12/31/2021   Impacted third molar tooth 12/31/2021   Irritant contact dermatitis associated with digestive stoma 12/25/2021   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   Continuous tobacco abuse 11/21/2021   Dermatitis associated with moisture 10/16/2021   Back pain 08/16/2021   Candidal endocarditis    History of radiation to head and neck region 07/11/2021   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   Dysgeusia 07/11/2021   Pressure injury of skin 06/17/2021   Tracheostomy status (HCC)    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   Heart failure with reduced ejection fraction (HCC) 04/28/2021   Anxiety 04/28/2021   Elevated troponin 04/01/2021   Elevated transaminase level 04/01/2021   Weight loss, unintentional 01/15/2021   Protein-calorie malnutrition, severe (HCC) 01/10/2021   Hypotension 01/08/2021   Chemotherapy induced nausea and vomiting 01/01/2021   Encounter  for dental examination 12/13/2020   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   Teeth missing 12/04/2020   Radiation caries 12/04/2020   Retained tooth root 12/04/2020   Chronic apical periodontitis 12/04/2020   Accretions on teeth 12/04/2020    PCP: Beatrix Lindau, MD   REFERRING PROVIDER: Jeb Miner, NP   REFERRING DIAG: Diagnosis Information  Diagnosis  C32.9 (ICD-10-CM) - Laryngeal cancer  (HCC)    THERAPY DIAG:  Cervicalgia - Plan: PT plan of care cert/re-cert  Abnormal posture - Plan: PT plan of care cert/re-cert  Muscle weakness (generalized) - Plan: PT plan of care cert/re-cert  Rationale for Evaluation and Treatment: Rehabilitation  ONSET DATE: 1.5 year history (trach placement)  SUBJECTIVE:                                                                                                                                                                                                         SUBJECTIVE STATEMENT: I was tired after last visit but it wasn't too bad.  It seems like it is easier to keep my head up, but it is hard to keep it straight.  I can see over the dashboard now in the car.  I didn't do my new exercises but I do have a 5# weight at home that I can use.    From Eval: Pt presents to PT with Rt sided neck tension and shortened tissue secondary to radiation.  Pt has difficulty holding up his head due to pain, postural weakness, and shortened tissue on the Rt due to radiation.  Pt was discharged from PT in August due to clot in Rt jugular vein.  Clot is now resolved.  He reports that he has been stretching and working on posture at home.   Hand dominance: Right  PERTINENT HISTORY:  Tracheostomy, radiation to neck, malignant neoplasm of supraglottis, thrombosis of Rt internal jugular vein, torticollis, heart failure  PAIN: 03/10/2023 Are you having pain? Yes: NPRS scale: 0/10, just stiff Pain location: Rt neck Pain description: stiff, throbbing Aggravating factors: overstretching, trying to stretch  Relieving factors: massaging, medication  PRECAUTIONS: Other: cancer, trach  WEIGHT BEARING RESTRICTIONS: No  FALLS:  Has patient fallen in last 6 months? No  LIVING ENVIRONMENT: Lives with: lives alone Lives in: House/apartment   PLOF: Independent, walking to mailbox  PATIENT GOALS: reduce neck pain, improve mobility   NEXT MD VISIT:  January  2025  OBJECTIVE:   DIAGNOSTIC FINDINGS:  None   PATIENT SURVEYS:  NDI 15/50 03/10/23: 6 min walk test: 1135 feet (age related norm in  healthy male is 2339 ft)- RPE 5/10 The Patient-Specific Functional Scale (PSFS)  Initial:  I am going to ask you to identify up to 3 important activities that you are unable to do or are having difficulty with as a result of this problem.  Today are there any activities that you are unable to do or having difficulty with because of this?  (Patient shown scale and patient rated each activity)  Follow up: When you first came in you had difficulty performing these activities.  Today do you still have difficulty?  Patient-Specific activity scoring scheme (Point to one number):  0 1 2 3 4 5 6 7 8 9  10 Unable                                                                                                          Able to perform To perform                                                                                                    activity at the same Activity         Level as before                                                                                                                       Injury or problem  Activity   Taking out trash                                                   Initial:  8/10                     follow up:     COGNITION: Overall cognitive status: Within functional limits for tasks assessed  SENSATION: WFL  POSTURE: rounded shoulders, forward head, increased thoracic kyphosis, and flexed trunk   PALPATION: Significant tension and reduced muscle length in bil neck. Fixed cervical spine segments with minimal mobility allowed.    CERVICAL ROM:  Active ROM A/PROM (deg) 01/13/23  Flexion Rests in 30 degrees flexion  Extension   Right lateral flexion Rests in 30 degrees  Left lateral flexion 10 degrees Rt lateral flexion  Right rotation 50 degrees   Left rotation 25 degrees    (Blank rows = not  tested)  UPPER EXTREMITY ROM:  UE A/ROM is WFLs without pain  UPPER EXTREMITY MMT: 4+/5 bil UE strength, neck strength 4-/5 throughout   TODAY'S TREATMENT:                                                                                                                              DATE:  03/10/23 NuStep: Level 4-arms and legs with cues to keep head upright x 8 minutes- PT present to discuss progress Sit to stand with 5# kettlebell chest press 2x10 Shoulder flexion and scaption: 2# 2x10 bil each Standing rows and shoulder extension with red band: 2x10 bil- verbal cues to improve eccentric control  Farmer's carry with 5# kettlebell - 1 lap in each hand   Manual: elongation and release to Rt>Lt neck after DN  03/05/2023 NuStep: Level 4-arms and legs with cues to keep head upright x 8 minutes- PT present to discuss progress Sit to stand with 5# kettlebell chest press 2x10 Shoulder flexion and scaption: 2# 2x10 bil each Standing rows and shoulder extension with red band: 2x10 bil- verbal cues to improve eccentric control  Farmer's carry with 5# kettlebell - 1 lap in each hand   Manual: elongation and release to Rt>Lt neck after DN   02/19/2023 Arm Bike: Level 1 42min/2min, then 1 min/1 min-focus on keeping head upright Seated scapular Retraction x 10 5 sec holds Seated red band rows: 2x10 Seated shoulder flexion and scaption 1# 2x10 Open book stretch x10 bil each Standing shoulder abduction & ER with red TB 2 x 10 Manual: Passive stretch into Lt sidebending and rotation, elongation to soft tissue to improve movement.    PATIENT EDUCATION:  Education details: Access Code: [email or web address] Person educated: Patient Education method: Explanation, Demonstration, and Handouts Education comprehension: verbalized understanding and returned demonstration  HOME EXERCISE PROGRAM: Access Code: [email or web address] URL: [email or web address] Date: 03/05/2023 Prepared by: Loetta Ringer  Exercises -  Seated Scapular Retraction  - 5 x daily - 7 x weekly - 1 sets - 10 reps - 5 hold - Supine Scapular Retraction  - 3 x daily - 7 x weekly - 1 sets - 10 reps - 5 hold - Seated Correct Posture  - 1 x daily - 7 x weekly - 3 sets - 10 reps - Seated Cervical Retraction  - 1 x daily - 7 x weekly - 3 sets - 10 reps - Supine Chin Tuck  - 1 x daily - 7 x weekly - 3 sets - 10 reps - Seated Cervical Sidebending AROM  - 3 x daily - 7 x weekly - 1 sets - 10 reps - 5-10 hold - Seated Assisted Cervical Rotation  with Towel  - 5 x daily - 7 x weekly - 1 sets - 3 reps - 10 hold - Shoulder External Rotation and Scapular Retraction with Resistance  - 1 x daily - 7 x weekly - 2 sets - 10 reps - Standing Shoulder Row with Anchored Resistance  - 1 x daily - 7 x weekly - 2 sets - 10 reps - Shoulder extension with resistance - Neutral  - 1 x daily - 7 x weekly - 2 sets - 10 reps - Sit to Stand Without Arm Support  - 2 x daily - 7 x weekly - 2 sets - 5 reps  ASSESSMENT:  CLINICAL IMPRESSION: Pt is making functional gain and is able to progress to more activity with emphasis on posture, core and head alignment.  He is now able to see over the dash board in the car and is able to maintain upright head position with walking and exercise tasks in the clinic.  Pt still with Rt lateral flexion, however he is able to keep his eyes upright. He is significantly deconditioned due to chronic illness and PT will advance physical activity while incorporating improved postural alignment and endurance.  6 min walk test is 1135 feet today with RPE of of 5/10 and age related norm is 2339 feet. PT discussed ways that he can incorporate head posture into his daily tasks for more consistency.  He will initiate a walking program to improve community endurance.   Patient will benefit from skilled PT to address the below impairments and improve overall function.    OBJECTIVE IMPAIRMENTS: decreased activity tolerance, increased muscle spasms,  impaired flexibility, postural dysfunction, and pain.   ACTIVITY LIMITATIONS: sitting, standing, and reach over head  PARTICIPATION LIMITATIONS: meal prep, driving, and community activity  PERSONAL FACTORS: 1-2 comorbidities: cancer on pallative care, chronic postural dysfunction  are also affecting patient's functional outcome.   REHAB POTENTIAL: Good  CLINICAL DECISION MAKING: Stable/uncomplicated  EVALUATION COMPLEXITY: Moderate   GOALS: Goals reviewed with patient? Yes  SHORT TERM GOALS: Target date: 02/10/2023      Be independent in initial  HEP Baseline:  Goal status: MET  2.  Verbalize and demonstrate postural corrections to improve tolerance for neutral posture and reduced neck strain  Baseline:  Goal status: MET   LONG TERM GOALS: Target date: 05/05/23    Be independent in advanced HEP Baseline:  Goal status: In progress   3.  Improve taking out  the trash to 9/10 on PSFS due to improved strength and endurance  Baseline: 8/10 Goal status: INITIAL  3.   Perform independent postural correction and hold this x 5-10 minutes  during activity  Baseline: needs visual feedback and can hold 2-3 min Goal status: NEW   4.   Improve 6 min walk test to 1700 feet to improve community function  Baseline: 1135 feet (03/10/23) Goal status: NEW   5.  Initiate a regular walking program and verbalize safe progression for endurance gains   Goal status: NEW    PLAN:  PT FREQUENCY: 1x/week  PT DURATION: 8 weeks  PLANNED INTERVENTIONS: Therapeutic exercises, Therapeutic activity, Neuromuscular re-education, Balance training, Gait training, Patient/Family education, Self Care, Joint mobilization, Aquatic Therapy, Dry Needling, Electrical stimulation, Spinal mobilization, Cryotherapy, Moist heat, Taping, Traction, Manual therapy, and Re-evaluation  PLAN FOR NEXT SESSION: Address cervical alignment, mobility, posture and functional mobility    Luella Sager, PT 03/10/23  8:46 AM  [facility] [address] [place],  [place] [address] Phone # [phone] Fax [phone]

## 2023-03-13 ENCOUNTER — Telehealth: Payer: Self-pay | Admitting: Nurse Practitioner

## 2023-03-13 NOTE — Telephone Encounter (Signed)
Marland Kitchen

## 2023-03-14 ENCOUNTER — Other Ambulatory Visit: Payer: Self-pay | Admitting: Internal Medicine

## 2023-03-14 MED ORDER — SERTRALINE HCL 20 MG/ML PO CONC: 25.0000 mg | Freq: Every day | ORAL | 2 refills | Status: DC

## 2023-03-14 NOTE — Progress Notes (Signed)
Palliative Medicine [facility]  [phone] [phone]   Name: David [name] Date: 03/14/2023 MRN: [id number]  DOB: [birth year]  Patient Care Team: Faith Rogue, DO as PCP - General (Internal Medicine) [name], Lise Auer, RN as Oncology Nurse Navigator [name], Skeet Latch, DO as Consulting Physician (Otolaryngology) Lonie Peak, MD as Consulting Physician (Radiation Oncology) Rachel Moulds, MD as Consulting Physician (Hematology and Oncology) Pickenpack-Cousar, Arty Baumgartner, NP as Nurse Practitioner (Nurse Practitioner)   REASON FOR CONSULTATION: David [name] is a 48 y.o. 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 He was recently hospitalized for several months at [facility] where he received treatment for fungal endocarditis and required emergent tracheostomy and PEG tube. During hospitalization CT of chest showed concerns for residual vs recurrent tumor. Palliative requested to re-engage for ongoing support and goals of care.   SOCIAL HISTORY:     reports that he has been smoking cigarettes. He has never used smokeless tobacco. He reports that he does not currently use alcohol after a past usage of about 12.0 standard drinks of alcohol per week. He reports that he does not currently use drugs after having used the following drugs: IV.  ADVANCE DIRECTIVES:  Patient reports he does not have advanced directives.  Would like to further discuss in the future.  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)    Pneumococcal bacteremia 06/09/2021   Pneumothorax    Left lung spontaneous pneumothorax at age 89 yr    S/P percutaneous endoscopic gastrostomy (PEG) tube placement (HCC) 06/24/2021    PAST SURGICAL HISTORY:  Past Surgical History:  Procedure Laterality Date   chest tubes Left    IR GASTROSTOMY TUBE MOD SED  06/18/2021   LAPAROSCOPIC INSERTION GASTROSTOMY TUBE N/A 06/20/2021   Procedure: LAPAROSCOPIC INSERTION GASTROSTOMY TUBE;  Surgeon: Berna Bue, MD;  Location: [facility] OR;  Service: General;  Laterality: N/A;  DOW PATIENT   TRACHEOSTOMY TUBE PLACEMENT N/A 06/09/2021   Procedure: AWAKE TRACHEOSTOMY;  Surgeon: Christia Reading, MD;  Location: [facility] OR;  Service: ENT;  Laterality: N/A;      ALLERGIES:  has no known allergies.  MEDICATIONS:  Current Outpatient Medications  Medication Sig Dispense Refill   apixaban (ELIQUIS) 5 MG TABS tablet TAKE 1 TABLET BY MOUTH TWICE A DAY 180 tablet 3   diazePAM 5 MG/5ML SOLN Take 2 mLs (2 mg total) by mouth every 8 (eight) hours as needed (muscle spasms and dysphagia). TAKE 2 MLS (2 MG TOTAL) BY MOUTH EVERY 8 (EIGHT) HOURS AS NEEDED FOR MUSCLE SPASMS (DYSPHAGIA). 100 mL 0   fluconazole (DIFLUCAN) 200 MG tablet Take 800 mg by mouth daily.     Glecaprevir-Pibrentasvir 100-40 MG TABS Take 1 tablet by mouth daily.     lidocaine (XYLOCAINE) 2 % solution Use as directed  15 mLs in the mouth or throat every 3 (three) hours as needed for mouth pain. Mix 1part 2% viscous lidocaine, 1part water. Swish and spit 30mL of total diluted mixture up to eight times a day, prn as needed for soreness 200 mL 2   midodrine (PROAMATINE) 5 MG  tablet TAKE 1 TABLET (5 MG TOTAL) BY MOUTH 3 (THREE) TIMES DAILY WITH MEALS. 270 tablet 2   ondansetron (ZOFRAN) 4 MG tablet Take 1 tablet (4 mg total) by mouth every 8 (eight) hours as needed for nausea or vomiting. 30 tablet 1   Pregabalin 20 MG/ML SOLN Take 40 mg by mouth 3 (three) times daily. 120 mL 1   sertraline (ZOLOFT) 20 MG/ML concentrated solution Mix 1.25 mLs (25 mg total)  with 1/2 cup of water and take by mouth daily. 60 mL 2   No current facility-administered medications for this visit.      Latest Ref Rng & Units 12/13/2022    2:10 PM 11/13/2022    3:40 PM 11/08/2022    8:59 AM  CBC  WBC 4.0 - 10.5 K/uL 4.8  4.0  7.7   Hemoglobin 13.0 - 17.0 g/dL 9.6  09.8  11.9   Hematocrit 39.0 - 52.0 % 31.2  35.6  40.8   Platelets 150 - 400 K/uL 108  160  249        Latest Ref Rng & Units 12/13/2022    2:10 PM 11/29/2022   10:00 AM 11/13/2022    3:40 PM  CMP  Glucose 70 - 99 mg/dL 147  92  49   BUN 6 - 20 mg/dL 16  12  11    Creatinine 0.61 - 1.24 mg/dL 8.29  5.62  1.30   Sodium 135 - 145 mmol/L 135  135  138   Potassium 3.5 - 5.1 mmol/L 3.4  4.0  3.7   Chloride 98 - 111 mmol/L 101  96  97   CO2 22 - 32 mmol/L 28  26  24    Calcium 8.9 - 10.3 mg/dL 8.7  9.1  9.0   Total Protein 6.5 - 8.1 g/dL  7.1  7.1   Total Bilirubin 0.3 - 1.2 mg/dL  0.3  <8.6   Alkaline Phos 38 - 126 U/L  67  81   AST 15 - 41 U/L  31  61   ALT 0 - 44 U/L  17  23     VITAL SIGNS: There were no vitals taken for this visit. There were no vitals filed for this visit.     Estimated body mass index is 14.31 kg/m as calculated from the following:   Height as of 02/10/23: 5\' 10"  (1.778 m).   Weight as of 02/10/23: 99 lb 11.2 oz (45.2 kg).  PERFORMANCE STATUS (ECOG) : 1 - Symptomatic but completely ambulatory  Assessment NAD, thin  RRR Normal breathing pattern, trach in place Right neck contracture, dependent edema Alert and oriented x3  Discussed the use of AI scribe software for clinical note  transcription with the patient, who gave verbal consent to proceed.   IMPRESSION:  Mr. David Mich "[name]" is a 48 year old male who presents with medication and symptom management. He is alone today reporting his mother does not feel well and is waiting in the car. He is doing well overall. Denies nausea, vomiting, constipation, or diarrhea. Remains active as possible. Reports his appetite has improved. Much appreciative of this and recent weight gain. Current weight is up to 108lbs from 100lbs  1/6, and 88lbs 11/22.   [name] states now that he is feeling much better, he would like to get a better grasp on his health. He wants to have dental work done, which he previously postponed. He has concerns about his urinary function, stating it takes a long time to urinate. He has not seen a urologist for this issue and is worried about potential kidney or prostate problems. Plans to follow up with a PCP to further evaluate.   He is tolerating all medications. Reports pain and anxiety is well controlled with use of Pregabalin and diazepam. We discussed continued use. He states difficulty with measuring out medication given syringe is not big enough. Medicine cup provided with ability to measure up to 80ml. We will continue to closely monitor and adjust regimen as needed. Attending methadone clinic daily.   He describes significant fatigue, falling asleep during the day, even while eating, and waking up with food on his chest. Education provided on the risk of aspiration. He verbalized understanding. He attributes this fatigue to untreated hepatitis C and suspects it might be causing his weakness. He also has difficulty sleeping at night, which he connects to his daytime napping. He has not taken his antiviral medication due to lack of refill. Advise to reach out to ID (Dr. Ninetta Lights) to assist with this. [name] also expresses wishes to have further lab work to assess his hepatitis status. Again, advised this  requires management from his infectious disease providers and their expertise. He verbalized understanding.   All questions answered and support provided.   Assessment and Plan  Chronic Pain Some confusion about dosing volume. Tolerating without difficulty.  -Provide a measuring cup for accurate dosing. -Pregabalin 50mg  three times daily. -Continue with daily support at methadone clinic.   Hepatitis C Not currently receiving treatment and experiencing symptoms of fatigue. -Contact infectious disease specialist (Dr. Ninetta Lights) to discuss restarting treatment and arrange for viral load testing.  Urinary symptoms New onset difficulty with urination, no prior urology evaluation. -Refer to PCP for possible urology evaluation of symptoms.   Dental concerns Expressed interest in dental work. -Encourage patient to schedule dental appointment.  Weight gain Noted 20lb weight gain since November. -Positive progress, continue current dietary habits.  I will plan to follow-up in 3-4 weeks.   Any controlled substances utilized were prescribed in the context of palliative care. PDMP has been reviewed.    Visit consisted of counseling and education dealing with the complex and emotionally intense issues of symptom management and palliative care in the setting of serious and potentially life-threatening illness.  Willette Alma, AGPCNP-BC  Palliative Medicine Team/[facility]

## 2023-03-14 NOTE — Progress Notes (Signed)
Notified by operator for after-hours call by Mr. Sawin mother, Yehuda Mao, regarding medication questions. She explains that they noticed on a recent AVS that glecaprevir-pibrentasvir was listed and wanted to know if he should be taking it. After chart review, I spoke with Dr. Ninetta Lights to clarify who confirmed that if Mr. Petrasek would like to take the medication than yes he can, for 8 weeks. I communicated this with [name]. I have also sent a refill for sertraline to their preferred pharmacy as requested. No other questions from the patient or [name] at this time.   Champ Mungo, DO Internal Medicine PGY-3

## 2023-03-18 ENCOUNTER — Encounter: Payer: Self-pay | Admitting: Nurse Practitioner

## 2023-03-18 ENCOUNTER — Inpatient Hospital Stay (HOSPITAL_BASED_OUTPATIENT_CLINIC_OR_DEPARTMENT_OTHER): Payer: MEDICAID | Admitting: Nurse Practitioner

## 2023-03-18 ENCOUNTER — Ambulatory Visit: Payer: MEDICAID

## 2023-03-18 VITALS — BP 106/79 | HR 72 | Temp 98.2°F | Resp 18 | Wt 108.6 lb

## 2023-03-18 DIAGNOSIS — Z515 Encounter for palliative care: Secondary | ICD-10-CM

## 2023-03-18 DIAGNOSIS — G893 Neoplasm related pain (acute) (chronic): Secondary | ICD-10-CM | POA: Diagnosis not present

## 2023-03-18 DIAGNOSIS — F419 Anxiety disorder, unspecified: Secondary | ICD-10-CM | POA: Diagnosis not present

## 2023-03-18 DIAGNOSIS — C329 Malignant neoplasm of larynx, unspecified: Secondary | ICD-10-CM

## 2023-03-18 MED ORDER — PREGABALIN 20 MG/ML PO SOLN: 50.0000 mg | Freq: Three times a day (TID) | ORAL | 1 refills | Status: DC

## 2023-03-24 ENCOUNTER — Ambulatory Visit: Payer: MEDICAID

## 2023-03-24 ENCOUNTER — Other Ambulatory Visit: Payer: Self-pay | Admitting: Nurse Practitioner

## 2023-03-24 DIAGNOSIS — M542 Cervicalgia: Secondary | ICD-10-CM

## 2023-03-24 DIAGNOSIS — Z515 Encounter for palliative care: Secondary | ICD-10-CM

## 2023-03-24 DIAGNOSIS — R293 Abnormal posture: Secondary | ICD-10-CM

## 2023-03-24 DIAGNOSIS — F419 Anxiety disorder, unspecified: Secondary | ICD-10-CM

## 2023-03-24 DIAGNOSIS — M6281 Muscle weakness (generalized): Secondary | ICD-10-CM

## 2023-03-24 DIAGNOSIS — C329 Malignant neoplasm of larynx, unspecified: Secondary | ICD-10-CM

## 2023-03-24 DIAGNOSIS — M62838 Other muscle spasm: Secondary | ICD-10-CM

## 2023-03-24 NOTE — Therapy (Signed)
 OUTPATIENT PHYSICAL THERAPY TREATMENT   Patient Name: David [name] MRN: [id number] DOB:1975-10-28, 48 y.o., male Today's Date: 03/24/2023  END OF SESSION:  PT End of Session - 03/24/23 0840     Visit Number 9    Date for PT Re-Evaluation 05/05/23    Authorization Type trillium approved 8 visits 03/11/23-05/05/23    Authorization - Visit Number 1    Authorization - Number of Visits 8    PT Start Time 0758    PT Stop Time 0840    PT Time Calculation (min) 42 min    Activity Tolerance Patient tolerated treatment well    Behavior During Therapy [facility] for tasks assessed/performed                      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)    Pneumococcal bacteremia 06/09/2021   Pneumothorax    Left lung spontaneous pneumothorax at age 41 yr    S/P percutaneous endoscopic gastrostomy (PEG) tube placement (HCC) 06/24/2021   Past Surgical History:  Procedure Laterality Date   chest tubes Left    IR GASTROSTOMY TUBE MOD SED  06/18/2021   LAPAROSCOPIC INSERTION GASTROSTOMY TUBE N/A 06/20/2021   Procedure: LAPAROSCOPIC INSERTION GASTROSTOMY TUBE;  Surgeon: Berna Bue, MD;  Location: [facility] OR;  Service: General;  Laterality: N/A;  DOW PATIENT   TRACHEOSTOMY TUBE PLACEMENT N/A 06/09/2021   Procedure: AWAKE TRACHEOSTOMY;  Surgeon: Christia Reading, MD;  Location: [facility] OR;  Service: ENT;  Laterality: N/A;   Patient Active Problem List   Diagnosis Date Noted   Subclinical  hypothyroidism 02/10/2023   FTT (failure to thrive) in adult 12/31/2022   Dysphagia, unspecified 12/31/2022   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 12/31/2022   Hepatitis A immune 11/14/2022   End of life care 10/23/2022   Acute purulent bronchitis 10/23/2022   Hepatitis C 10/09/2022   Left leg pain 10/09/2022   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   History of pancytopenia 09/12/2022   Torticollis 07/04/2022   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   Defective dental restoration 12/31/2021   Chronic periodontitis 12/31/2021   Impacted third molar tooth 12/31/2021   Irritant contact dermatitis associated with digestive stoma 12/25/2021   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   Continuous tobacco abuse 11/21/2021   Dermatitis associated with moisture 10/16/2021   Back pain 08/16/2021   Candidal endocarditis    History of radiation to head and neck region 07/11/2021   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   Dysgeusia 07/11/2021   Pressure injury of skin 06/17/2021   Tracheostomy status (HCC)    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   Heart failure with reduced ejection fraction (HCC) 04/28/2021   Anxiety 04/28/2021   Elevated troponin 04/01/2021   Elevated transaminase level 04/01/2021   Weight loss, unintentional 01/15/2021   Protein-calorie malnutrition, severe (HCC) 01/10/2021   Hypotension 01/08/2021   Chemotherapy induced nausea and vomiting 01/01/2021   Encounter for dental examination  12/13/2020   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   Teeth missing 12/04/2020   Radiation caries 12/04/2020   Retained tooth root 12/04/2020   Chronic apical periodontitis 12/04/2020   Accretions on teeth 12/04/2020    PCP: Adron Bene, MD   REFERRING PROVIDER: Glee Arvin, NP   REFERRING DIAG: Diagnosis Information  Diagnosis  C32.9 (ICD-10-CM) - Laryngeal cancer (HCC)    THERAPY DIAG:   Cervicalgia  Muscle weakness (generalized)  Abnormal posture  Rationale for Evaluation and Treatment: Rehabilitation  ONSET DATE: 1.5 year history (trach placement)  SUBJECTIVE:                                                                                                                                                                                                         SUBJECTIVE STATEMENT: I wish my head would go straight.  I missed last week due to weather.   From Eval: Pt presents to PT with Rt sided neck tension and shortened tissue secondary to radiation.  Pt has difficulty holding up his head due to pain, postural weakness, and shortened tissue on the Rt due to radiation.  Pt was discharged from PT in August due to clot in Rt jugular vein.  Clot is now resolved.  He reports that he has been stretching and working on posture at home.   Hand dominance: Right  PERTINENT HISTORY:  Tracheostomy, radiation to neck, malignant neoplasm of supraglottis, thrombosis of Rt internal jugular vein, torticollis, heart failure  PAIN: 03/24/2023 Are you having pain? Yes: NPRS scale: 0/10, just stiff Pain location: Rt neck Pain description: stiff, throbbing Aggravating factors: overstretching, trying to stretch  Relieving factors: massaging, medication  PRECAUTIONS: Other: cancer, trach  WEIGHT BEARING RESTRICTIONS: No  FALLS:  Has patient fallen in last 6 months? No  LIVING ENVIRONMENT: Lives with: lives alone Lives in: House/apartment   PLOF: Independent, walking to mailbox  PATIENT GOALS: reduce neck pain, improve mobility   NEXT MD VISIT:  January 2025  OBJECTIVE:   DIAGNOSTIC FINDINGS:  None   PATIENT SURVEYS:  NDI 15/50 03/10/23: 6 min walk test: 1135 feet (age related norm in healthy male is 2339 ft)- RPE 5/10 The Patient-Specific Functional Scale (PSFS)  Initial:  I am going to ask you to identify up to 3 important activities that you are unable to do or are  having difficulty with as a result of this problem.  Today are there any activities that you are unable to do or having difficulty with because of this?  (Patient shown scale and patient rated each  activity)  Follow up: When you first came in you had difficulty performing these activities.  Today do you still have difficulty?  Patient-Specific activity scoring scheme (Point to one number):  0 1 2 3 4 5 6 7 8 9  10 Unable                                                                                                          Able to perform To perform                                                                                                    activity at the same Activity         Level as before                                                                                                                       Injury or problem  Activity   Taking out trash                                                   Initial:  8/10                     follow up:     COGNITION: Overall cognitive status: Within functional limits for tasks assessed  SENSATION: WFL  POSTURE: rounded shoulders, forward head, increased thoracic kyphosis, and flexed trunk   PALPATION: Significant tension and reduced muscle length in bil neck. Fixed cervical spine segments with minimal mobility allowed.    CERVICAL ROM:   Active ROM A/PROM (deg) 01/13/23  Flexion Rests in 30 degrees flexion  Extension   Right lateral flexion Rests in 30 degrees  Left lateral flexion 10 degrees Rt lateral flexion  Right rotation 50 degrees   Left rotation 25 degrees    (Blank rows = not tested)  UPPER EXTREMITY ROM:  UE A/ROM is WFLs without pain  UPPER EXTREMITY MMT: 4+/5 bil UE strength, neck strength  4-/5 throughout   TODAY'S TREATMENT:                                                                                                                              DATE:  03/24/23 NuStep: Level 4-arms and legs with  cues to keep head upright x 8 minutes- PT present to discuss progress Sit to stand with 5# kettlebell chest press 2x10 Shoulder flexion and scaption: 2# 2x10 bil each Horizontal abduction: red band 2x10 Standing rows and shoulder extension with red band: 2x10 bil- good control today Farmer's carry with 5# kettlebell - 1 lap in each hand   Manual: IASTM with small blade to Rt and Lt upper trap and levator- brush strokes and fanning   03/10/23 NuStep: Level 4-arms and legs with cues to keep head upright x 8 minutes- PT present to discuss progress Sit to stand with 5# kettlebell chest press 2x10 Shoulder flexion and scaption: 2# 2x10 bil each Standing rows and shoulder extension with red band: 2x10 bil- verbal cues to improve eccentric control  Farmer's carry with 5# kettlebell - 1 lap in each hand   Manual: elongation and release to Rt>Lt neck after DN  03/05/2023 NuStep: Level 4-arms and legs with cues to keep head upright x 8 minutes- PT present to discuss progress Sit to stand with 5# kettlebell chest press 2x10 Shoulder flexion and scaption: 2# 2x10 bil each Standing rows and shoulder extension with red band: 2x10 bil- verbal cues to improve eccentric control  Farmer's carry with 5# kettlebell - 1 lap in each hand   Manual: elongation and release to Rt>Lt neck after DN  PATIENT EDUCATION:  Education details: Access Code: [email or web address] Person educated: Patient Education method: Explanation, Demonstration, and Handouts Education comprehension: verbalized understanding and returned demonstration  HOME EXERCISE PROGRAM: Access Code: [email or web address] URL: [email or web address] Date: 03/05/2023 Prepared by: Tresa Endo  Exercises - Seated Scapular Retraction  - 5 x daily - 7 x weekly - 1 sets - 10 reps - 5 hold - Supine Scapular Retraction  - 3 x daily - 7 x weekly - 1 sets - 10 reps - 5 hold - Seated Correct Posture  - 1 x daily - 7 x weekly - 3 sets - 10 reps - Seated Cervical  Retraction  - 1 x daily - 7 x weekly - 3 sets - 10 reps - Supine Chin Tuck  - 1 x daily - 7 x weekly - 3 sets - 10 reps - Seated Cervical Sidebending AROM  - 3 x daily - 7 x weekly - 1 sets - 10 reps - 5-10 hold - Seated Assisted Cervical Rotation with Towel  - 5 x daily - 7 x weekly - 1 sets - 3 reps - 10 hold - Shoulder External Rotation and Scapular Retraction with Resistance  - 1 x daily - 7 x weekly - 2 sets - 10 reps - Standing Shoulder Row with Anchored Resistance  - 1 x  daily - 7 x weekly - 2 sets - 10 reps - Shoulder extension with resistance - Neutral  - 1 x daily - 7 x weekly - 2 sets - 10 reps - Sit to Stand Without Arm Support  - 2 x daily - 7 x weekly - 2 sets - 5 reps  ASSESSMENT:  CLINICAL IMPRESSION: Pt is making functional gain and is able to progress to more activity with emphasis on posture, core and head alignment.   Pt still with Rt lateral flexion, however he is able to keep his eyes upright with more consistency.  He is significantly deconditioned due to chronic illness and PT will advance physical activity while incorporating improved postural alignment and endurance. Used a Ship broker today for visual feedback for head alignment and PT provided verbal cues throughout session.  Good response to IASTM to bil neck.  Patient will benefit from skilled PT to address the below impairments and improve overall function.  OBJECTIVE IMPAIRMENTS: decreased activity tolerance, increased muscle spasms, impaired flexibility, postural dysfunction, and pain.   ACTIVITY LIMITATIONS: sitting, standing, and reach over head  PARTICIPATION LIMITATIONS: meal prep, driving, and community activity  PERSONAL FACTORS: 1-2 comorbidities: cancer on pallative care, chronic postural dysfunction  are also affecting patient's functional outcome.   REHAB POTENTIAL: Good  CLINICAL DECISION MAKING: Stable/uncomplicated  EVALUATION COMPLEXITY: Moderate   GOALS: Goals reviewed with patient?  Yes  SHORT TERM GOALS: Target date: 02/10/2023      Be independent in initial  HEP Baseline:  Goal status: MET  2.  Verbalize and demonstrate postural corrections to improve tolerance for neutral posture and reduced neck strain  Baseline:  Goal status: MET   LONG TERM GOALS: Target date: 05/05/23    Be independent in advanced HEP Baseline:  Goal status: In progress   3.  Improve taking out  the trash to 9/10 on PSFS due to improved strength and endurance  Baseline: 8/10 Goal status: INITIAL  3.   Perform independent postural correction and hold this x 5-10 minutes  during activity  Baseline: needs visual feedback and can hold 2-3 min Goal status: NEW   4.   Improve 6 min walk test to 1700 feet to improve community function  Baseline: 1135 feet (03/10/23) Goal status: NEW   5.  Initiate a regular walking program and verbalize safe progression for endurance gains   Goal status: NEW    PLAN:  PT FREQUENCY: 1x/week  PT DURATION: 8 weeks  PLANNED INTERVENTIONS: Therapeutic exercises, Therapeutic activity, Neuromuscular re-education, Balance training, Gait training, Patient/Family education, Self Care, Joint mobilization, Aquatic Therapy, Dry Needling, Electrical stimulation, Spinal mobilization, Cryotherapy, Moist heat, Taping, Traction, Manual therapy, and Re-evaluation  PLAN FOR NEXT SESSION: Address cervical alignment, mobility, posture and functional mobility    Lorrene Reid, PT 03/24/23 8:42 AM  [facility] [address] [place], [place] [address] Phone # [phone] Fax [phone]

## 2023-03-25 ENCOUNTER — Telehealth: Payer: Self-pay | Admitting: *Deleted

## 2023-03-25 ENCOUNTER — Telehealth: Payer: Self-pay

## 2023-03-25 NOTE — Telephone Encounter (Signed)
 Pt and pt mother called asking about a referral to a urologist for difficulty starting stream, Botox injections for muscle spasticity, and dental work for [employer]. Pt and mother were instructed to reach out to PCP for a urology referral, and to reconnect with established PT/OT office for discussion regarding injections and to reconnect with established dental services to further discuss dental procedures. Verbalized understanding and agreement with plan. No further needs at this time.

## 2023-03-25 NOTE — Telephone Encounter (Signed)
 Patient is now scheduled for a TeleHealth appointment on 03/27/2023 at 3:45 M

## 2023-03-25 NOTE — Telephone Encounter (Signed)
 Call from patient's mother and spoke with patient. Having problems with urinating.  Tries to go but unable to at times.  Has to strain to go.  Does not feel is emptying bladder.  No c/o pain or fevers.  Urine is a Rust color. Patient unable to come in for an appointment as his mother who provides his transportation has Covid.  Is about a week out per the mom. Would like to get a referral to Urology.  Told patient and mother that they would need to be seen or talk with a doctor before a referral can be done. Attempts to schedule a TeleHealth appointment for patient.

## 2023-03-26 ENCOUNTER — Telehealth: Payer: MEDICAID | Admitting: Student

## 2023-03-27 ENCOUNTER — Ambulatory Visit: Payer: MEDICAID | Admitting: Student

## 2023-03-27 DIAGNOSIS — R39198 Other difficulties with micturition: Secondary | ICD-10-CM

## 2023-03-27 NOTE — Assessment & Plan Note (Addendum)
 This telehealth visit is for difficulty urinating for the past few months.  The patient is accompanied by his mother who contributed to the history.  He states that for the past few months he has had difficulty urinating.  He has a sensation that he needs to urinate, but he goes to the bathroom and is unable to do so.  This happens several times over the course of the night.  When he does urinate, he feels like he has a strong stream and is able to completely empty his bladder.  He denies any dysuria, hematuria, penile discharge.  He does not have any urinary incontinence or dribbling. No systemic infectious symptoms. My assessment of this problem is limited based on the nature of the telehealth visit.  I am not exactly sure what the etiology of his urinary retention is.  He is young for BPH.  I do not see any PSA in his chart. If this is the cause, he could benefit from Flomax. It may very well be a side effect of his medications, particularly the diazepam, which is being managed by his palliative care doctor. He was started on this more recently for spasms. 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  I recommended he discuss these symptoms with the providers who are prescribing these medications to him and consider dose changes or alternatives.   He is requesting a referral to Urology for further evaluation, which I think is reasonable. His symptoms warrant further in-person evaluation, especially if they persist despite medication adjustments. I have sent in a referral.

## 2023-03-27 NOTE — Progress Notes (Addendum)
   CC: difficulty urinating  This is a telephone encounter between David [name] and David [name] on 03/27/2023 for difficulty urinating. The visit was conducted with the patient located at home and David [name] at [facility]. The patient's identity was confirmed using their DOB and current address. The patient has consented to being evaluated through a telephone encounter and understands the associated risks (an examination cannot be done and the patient may need to come in for an appointment) / benefits (allows the patient to remain at home, decreasing exposure to coronavirus). I personally spent 30 minutes on medical discussion.   HPI:  [name] is a 48 y.o. with PMH as below.  Please see A&P for assessment of the patient's acute and chronic medical conditions.   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 [facility])    Pneumococcal bacteremia 06/09/2021   Pneumothorax    Left lung spontaneous pneumothorax at age 20 yr    S/P percutaneous endoscopic gastrostomy (PEG) tube placement (HCC) 06/24/2021   Review of Systems: Negative except as noted in the problem based assessment and plan.  Assessment & Plan:   Difficulty urinating This telehealth visit is for difficulty urinating for the past few months.  The patient is accompanied by his mother who contributed to the history.  He states that for the past few months he has had difficulty urinating.  He has a sensation that he needs to urinate,  but he goes to the bathroom and is unable to do so.  This happens several times over the course of the night.  When he does urinate, he feels like he has a strong stream and is able to completely empty his bladder.  He denies any dysuria, hematuria, penile discharge.  He does not have any urinary incontinence or dribbling. No systemic infectious symptoms. My assessment of this problem is limited based on the nature of the telehealth visit.  I am not exactly sure what the etiology of his urinary retention is.  He is young for BPH.  I do not see any PSA in his chart. If this is the cause, he could benefit from Flomax. It may very well be a side effect of his medications, particularly the diazepam, which is being managed by his palliative care doctor. He was started on this more recently for spasms. 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  I recommended he discuss these symptoms with the providers who are prescribing these medications to him and consider dose changes or alternatives.   He is requesting a referral to Urology for further evaluation, which I think is reasonable. His symptoms warrant further in-person evaluation, especially if they persist despite medication adjustments. I have sent in a referral.  Patient discussed with Dr. Marney Doctor, MD Internal Medicine Resident

## 2023-04-01 ENCOUNTER — Ambulatory Visit: Payer: MEDICAID | Attending: Radiation Oncology

## 2023-04-01 DIAGNOSIS — R293 Abnormal posture: Secondary | ICD-10-CM | POA: Insufficient documentation

## 2023-04-01 DIAGNOSIS — M542 Cervicalgia: Secondary | ICD-10-CM | POA: Insufficient documentation

## 2023-04-01 DIAGNOSIS — M6281 Muscle weakness (generalized): Secondary | ICD-10-CM | POA: Diagnosis present

## 2023-04-01 NOTE — Therapy (Signed)
 OUTPATIENT PHYSICAL THERAPY TREATMENT   Patient Name: David [name] MRN: [id number] DOB:1975-02-27, 48 y.o., male Today's Date: 04/01/2023  END OF SESSION:  PT End of Session - 04/01/23 0833     Visit Number 10    Date for PT Re-Evaluation 05/05/23    Authorization Type trillium approved 8 visits 03/11/23-05/05/23    Authorization Time Period 01/20/2023-03/10/2023    Authorization - Visit Number 2    Authorization - Number of Visits 8    PT Start Time 0746    PT Stop Time 0831    PT Time Calculation (min) 45 min    Activity Tolerance Patient tolerated treatment well    Behavior During Therapy [facility] for tasks assessed/performed                       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)    Pneumococcal bacteremia 06/09/2021   Pneumothorax    Left lung spontaneous pneumothorax at age 33 yr    S/P percutaneous endoscopic gastrostomy (PEG) tube placement (HCC) 06/24/2021   Past Surgical History:  Procedure Laterality Date   chest tubes Left    IR GASTROSTOMY TUBE MOD SED  06/18/2021   LAPAROSCOPIC INSERTION GASTROSTOMY TUBE N/A 06/20/2021   Procedure: LAPAROSCOPIC INSERTION GASTROSTOMY TUBE;  Surgeon: Berna Bue, MD;  Location: [facility] OR;  Service: General;  Laterality: N/A;  DOW PATIENT   TRACHEOSTOMY TUBE PLACEMENT N/A 06/09/2021   Procedure: AWAKE TRACHEOSTOMY;  Surgeon: Christia Reading, MD;  Location: [facility] OR;  Service: ENT;  Laterality: N/A;   Patient Active Problem  List   Diagnosis Date Noted   Difficulty urinating 03/27/2023   Subclinical hypothyroidism 02/10/2023   FTT (failure to thrive) in adult 12/31/2022   Dysphagia, unspecified 12/31/2022   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 12/31/2022   Hepatitis A immune 11/14/2022   End of life care 10/23/2022   Acute purulent bronchitis 10/23/2022   Hepatitis C 10/09/2022   Left leg pain 10/09/2022   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   History of pancytopenia 09/12/2022   Torticollis 07/04/2022   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   Defective dental restoration 12/31/2021   Chronic periodontitis 12/31/2021   Impacted third molar tooth 12/31/2021   Irritant contact dermatitis associated with digestive stoma 12/25/2021   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   Continuous tobacco abuse 11/21/2021   Dermatitis associated with moisture 10/16/2021   Back pain 08/16/2021   Candidal endocarditis    History of radiation to head and neck region 07/11/2021   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   Dysgeusia 07/11/2021   Pressure injury of skin 06/17/2021   Tracheostomy status (HCC)    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   Heart failure with reduced ejection fraction (HCC) 04/28/2021   Anxiety 04/28/2021   Elevated troponin 04/01/2021   Elevated transaminase level 04/01/2021   Weight loss, unintentional 01/15/2021   Protein-calorie malnutrition, severe (HCC) 01/10/2021   Hypotension 01/08/2021  Chemotherapy induced nausea and vomiting 01/01/2021   Encounter for dental examination 12/13/2020   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   Teeth missing 12/04/2020   Radiation caries 12/04/2020   Retained tooth root 12/04/2020   Chronic apical periodontitis 12/04/2020   Accretions on teeth 12/04/2020    PCP: Adron Bene, MD   REFERRING PROVIDER: Glee Arvin, NP   REFERRING DIAG: Diagnosis Information  Diagnosis   C32.9 (ICD-10-CM) - Laryngeal cancer (HCC)    THERAPY DIAG:  Cervicalgia  Muscle weakness (generalized)  Abnormal posture  Rationale for Evaluation and Treatment: Rehabilitation  ONSET DATE: 1.5 year history (trach placement)  SUBJECTIVE:                                                                                                                                                                                                         SUBJECTIVE STATEMENT: I feel ok after treatment.  I'm walking outside as much as I can.   From Eval: Pt presents to PT with Rt sided neck tension and shortened tissue secondary to radiation.  Pt has difficulty holding up his head due to pain, postural weakness, and shortened tissue on the Rt due to radiation.  Pt was discharged from PT in August due to clot in Rt jugular vein.  Clot is now resolved.  He reports that he has been stretching and working on posture at home.   Hand dominance: Right  PERTINENT HISTORY:  Tracheostomy, radiation to neck, malignant neoplasm of supraglottis, thrombosis of Rt internal jugular vein, torticollis, heart failure  PAIN: 03/31/2023 Are you having pain? Yes: NPRS scale: 0/10, just stiff Pain location: Rt neck Pain description: stiff, throbbing Aggravating factors: overstretching, trying to stretch  Relieving factors: massaging, medication  PRECAUTIONS: Other: cancer, trach  WEIGHT BEARING RESTRICTIONS: No  FALLS:  Has patient fallen in last 6 months? No  LIVING ENVIRONMENT: Lives with: lives alone Lives in: House/apartment   PLOF: Independent, walking to mailbox  PATIENT GOALS: reduce neck pain, improve mobility   NEXT MD VISIT:  January 2025  OBJECTIVE:   DIAGNOSTIC FINDINGS:  None   PATIENT SURVEYS:  NDI 15/50 03/10/23: 6 min walk test: 1135 feet (age related norm in healthy male is 2339 ft)- RPE 5/10 The Patient-Specific Functional Scale (PSFS)  Initial:  I am going to ask you to identify up  to 3 important activities that you are unable to do or are having difficulty with as a result of this problem.  Today are there any activities that you are unable to do or having difficulty with  because of this?  (Patient shown scale and patient rated each activity)  Follow up: When you first came in you had difficulty performing these activities.  Today do you still have difficulty?  Patient-Specific activity scoring scheme (Point to one number):  0 1 2 3 4 5 6 7 8 9  10 Unable                                                                                                          Able to perform To perform                                                                                                    activity at the same Activity         Level as before                                                                                                                       Injury or problem  Activity   Taking out trash                                                   Initial:  8/10                     follow up:     COGNITION: Overall cognitive status: Within functional limits for tasks assessed  SENSATION: WFL  POSTURE: rounded shoulders, forward head, increased thoracic kyphosis, and flexed trunk   PALPATION: Significant tension and reduced muscle length in bil neck. Fixed cervical spine segments with minimal mobility allowed.    CERVICAL ROM:   Active ROM A/PROM (deg) 01/13/23  Flexion Rests in 30 degrees flexion  Extension   Right lateral flexion Rests in 30 degrees  Left lateral flexion 10 degrees Rt lateral flexion  Right rotation 50 degrees   Left rotation 25 degrees    (Blank rows = not tested)  UPPER EXTREMITY ROM:  UE A/ROM is WFLs without  pain  UPPER EXTREMITY MMT: 4+/5 bil UE strength, neck strength 4-/5 throughout   TODAY'S TREATMENT:                                                                                                                               DATE:   04/01/23 NuStep: Level 4-arms and legs with cues to keep head upright x 8 minutes- PT present to discuss progress Open book stretch sidelying x10, low trunk rotation 10" hold x3 each side Sit to stand with 8# dumbbell chest press x10, 7# x10 Seated row: 15# 2x10 Walking in reverse 10# x10 Shoulder flexion and scaption: 2# 2x10 bil each Standing rows and shoulder extension with green band: 2x10 bil- good control today  Manual: IASTM with small blade to Rt and Lt upper trap, cervical paraspinals, suboccipitals and levator- brush strokes and fanning   03/24/23 NuStep: Level 4-arms and legs with cues to keep head upright x 8 minutes- PT present to discuss progress Sit to stand with 5# kettlebell chest press 2x10 Shoulder flexion and scaption: 2# 2x10 bil each Horizontal abduction: red band 2x10 Standing rows and shoulder extension with red band: 2x10 bil- good control today Farmer's carry with 5# kettlebell - 1 lap in each hand   Manual: IASTM with small blade to Rt and Lt upper trap and levator- brush strokes and fanning   03/10/23 NuStep: Level 4-arms and legs with cues to keep head upright x 8 minutes- PT present to discuss progress Sit to stand with 5# kettlebell chest press 2x10 Shoulder flexion and scaption: 2# 2x10 bil each Standing rows and shoulder extension with red band: 2x10 bil- verbal cues to improve eccentric control  Farmer's carry with 5# kettlebell - 1 lap in each hand   Manual: elongation and release to Rt>Lt neck after DN  03/05/2023 NuStep: Level 4-arms and legs with cues to keep head upright x 8 minutes- PT present to discuss progress Sit to stand with 5# kettlebell chest press 2x10 Shoulder flexion and scaption: 2# 2x10 bil each Standing rows and shoulder extension with red band: 2x10 bil- verbal cues to improve eccentric control  Farmer's carry with 5# kettlebell - 1 lap in each hand   Manual: elongation and release to Rt>Lt neck after DN  PATIENT  EDUCATION:  Education details: Access Code: [email or web address] Person educated: Patient Education method: Explanation, Demonstration, and Handouts Education comprehension: verbalized understanding and returned demonstration  HOME EXERCISE PROGRAM: Access Code: [email or web address] URL: [email or web address] Date: 03/05/2023 Prepared by: Tresa Endo  Exercises - Seated Scapular Retraction  - 5 x daily - 7 x weekly - 1 sets - 10 reps - 5 hold - Supine Scapular Retraction  - 3 x daily - 7 x weekly - 1 sets - 10 reps - 5 hold - Seated Correct Posture  - 1 x daily - 7 x weekly - 3 sets - 10 reps - Seated Cervical Retraction  - 1 x daily - 7 x  weekly - 3 sets - 10 reps - Supine Chin Tuck  - 1 x daily - 7 x weekly - 3 sets - 10 reps - Seated Cervical Sidebending AROM  - 3 x daily - 7 x weekly - 1 sets - 10 reps - 5-10 hold - Seated Assisted Cervical Rotation with Towel  - 5 x daily - 7 x weekly - 1 sets - 3 reps - 10 hold - Shoulder External Rotation and Scapular Retraction with Resistance  - 1 x daily - 7 x weekly - 2 sets - 10 reps - Standing Shoulder Row with Anchored Resistance  - 1 x daily - 7 x weekly - 2 sets - 10 reps - Shoulder extension with resistance - Neutral  - 1 x daily - 7 x weekly - 2 sets - 10 reps - Sit to Stand Without Arm Support  - 2 x daily - 7 x weekly - 2 sets - 5 reps  ASSESSMENT:  CLINICAL IMPRESSION: Pt is making functional gain and is able to progress to more activity with emphasis on posture, core and head alignment.   Pt reports thoracolumbar pain this morning and stretching seemed to resolve this.  Pt was challenged by increased weight today and new activity with weight machines.  He is significantly deconditioned due to chronic illness and PT will advance physical activity while incorporating improved postural alignment and endurance. Pt was fatigued today and had a difficult time holding his head upright with exercise.  Good response to IASTM to bil neck.  Patient will  benefit from skilled PT to address the below impairments and improve overall function.  OBJECTIVE IMPAIRMENTS: decreased activity tolerance, increased muscle spasms, impaired flexibility, postural dysfunction, and pain.   ACTIVITY LIMITATIONS: sitting, standing, and reach over head  PARTICIPATION LIMITATIONS: meal prep, driving, and community activity  PERSONAL FACTORS: 1-2 comorbidities: cancer on pallative care, chronic postural dysfunction  are also affecting patient's functional outcome.   REHAB POTENTIAL: Good  CLINICAL DECISION MAKING: Stable/uncomplicated  EVALUATION COMPLEXITY: Moderate   GOALS: Goals reviewed with patient? Yes  SHORT TERM GOALS: Target date: 02/10/2023      Be independent in initial  HEP Baseline:  Goal status: MET  2.  Verbalize and demonstrate postural corrections to improve tolerance for neutral posture and reduced neck strain  Baseline:  Goal status: MET   LONG TERM GOALS: Target date: 05/05/23    Be independent in advanced HEP Baseline:  Goal status: In progress   3.  Improve taking out  the trash to 9/10 on PSFS due to improved strength and endurance  Baseline: 8/10 Goal status: INITIAL  3.   Perform independent postural correction and hold this x 5-10 minutes  during activity  Baseline: needs visual feedback and can hold 2-3 min Goal status: NEW   4.   Improve 6 min walk test to 1700 feet to improve community function  Baseline: 1135 feet (03/10/23) Goal status: NEW   5.  Initiate a regular walking program and verbalize safe progression for endurance gains   Goal status: NEW    PLAN:  PT FREQUENCY: 1x/week  PT DURATION: 8 weeks  PLANNED INTERVENTIONS: Therapeutic exercises, Therapeutic activity, Neuromuscular re-education, Balance training, Gait training, Patient/Family education, Self Care, Joint mobilization, Aquatic Therapy, Dry Needling, Electrical stimulation, Spinal mobilization, Cryotherapy, Moist heat, Taping,  Traction, Manual therapy, and Re-evaluation  PLAN FOR NEXT SESSION: Address cervical alignment, mobility, posture and functional mobility.  Add Farmer's carry again   Lorrene Reid, PT 04/01/23  8:34 AM  [facility] [address] [place], [place] [address] Phone # [phone] Fax [phone]

## 2023-04-02 ENCOUNTER — Other Ambulatory Visit: Payer: Self-pay | Admitting: Nurse Practitioner

## 2023-04-02 NOTE — Addendum Note (Signed)
 Addended by: Dickie La on: 04/02/2023 11:41 AM   Modules accepted: Level of Service

## 2023-04-02 NOTE — Progress Notes (Signed)
 Internal [facility] Clinic Attending  Case discussed with the resident at the time of the visit.  We reviewed the resident's history and exam and pertinent patient test results.  I agree with the assessment, diagnosis, and plan of care documented in the resident's note.

## 2023-04-03 ENCOUNTER — Ambulatory Visit (HOSPITAL_COMMUNITY)
Admission: RE | Admit: 2023-04-03 | Discharge: 2023-04-03 | Disposition: A | Discharge status: Home / Self Care | Payer: MEDICAID | Source: Ambulatory Visit | Attending: Acute Care | Admitting: Acute Care

## 2023-04-03 DIAGNOSIS — C329 Malignant neoplasm of larynx, unspecified: Secondary | ICD-10-CM | POA: Diagnosis present

## 2023-04-03 DIAGNOSIS — L598 Other specified disorders of the skin and subcutaneous tissue related to radiation: Secondary | ICD-10-CM | POA: Insufficient documentation

## 2023-04-03 DIAGNOSIS — Z930 Tracheostomy status: Secondary | ICD-10-CM | POA: Diagnosis not present

## 2023-04-03 DIAGNOSIS — J386 Stenosis of larynx: Secondary | ICD-10-CM | POA: Diagnosis present

## 2023-04-03 DIAGNOSIS — M436 Torticollis: Secondary | ICD-10-CM | POA: Diagnosis not present

## 2023-04-03 NOTE — Progress Notes (Signed)
 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 Has had slow but steady overall decline over the summer. Specifically significant adult FTT w/ECOG 3. He remains hospice appropriate in my opinion. Still very debilitated, malnourished, his neck contraction is worse.This being said aside from the neck contracture being worse his appetite has been a little better over last few weeks and his overall mood improved. He presents today for planned trach change. Still has his modified trach #7 biovona in place of his 4 shiley (cut the larger part of the flange off and punched new hole in flange so that it did not rub up against his neck contracture, he has also been using left over #4 shiley disposable inner cannulas (the biovona is designed cuffless) and also had modified the trach length to be the same as the #4 inner cannula (cut about 1.5 cm off distal end of trach) because otherwise sputum would occlude between the end of the internal cannula and the distal end of trach.  Presents today for planned trach change  Chart review since last visit: Discharged from [facility] (doing well) -last visit w/ PT 3/4 looks like he is at least not getting worse.   Review of Systems  All other systems reviewed and are negative.   Exam  General 48 year old male sitting up in his chair. He is in no distress.  HENT sp quality soft and raspy His neck remains contracted w/ ear nearly touching the right shoulder however he does tolerate passive assist w/ movement better than he has in past Pulm clear Card rrr Abd soft Ext warm  Neuro intact  Procedure: the # 7 portex was removed. Modifications were made to the new trach to make it identical to one we removed. Then using clean technique the new # 7 portex was placed over obturator. Placement verified via ETCO2. Pt tolerated well. 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 He is not a candidate for decannulation.   He was switched  to 7 portex with # 6mm inner diameter from 4 shiley due to his marked neck contracture developing pressure ulcer on the left of stoma. He at home had modified the trach flange on the right sided so that it didn't rub on his neck, and modified the lumen length so that he can use an internal shiley # 4 disposable which he has in surplus so that he can clean the inner cannula and not have to have trach removed every time he plugs which is a chronic problem for him. We discussed at length that modification of his trach would void any warrantee and does increase the risk of failure (I worry mostly about the integrity of the inner cannula) but he has tolerated this modification for over a month, is doing better with it, and he will continue to make this modification at home regardless if we modify it in the clinic. He understands this risk and accepts it.   Plan ROV 4 weeks Cont PMV as tolerated HME  as able Continue w/ his modified trach. Not really good alternatives given his neck contracture. I also think given he really is palliative in my opinion we should do all we can do to improve his QOL      My time 16 min    Simonne Martinet ACNP-BC [facility] Pulmonary/Critical Care Pager # [phone] OR # [phone] if no answer

## 2023-04-03 NOTE — Progress Notes (Signed)
 Tracheostomy Procedure Note  David [name] [id number] 16-Nov-1975  Pre Procedure Tracheostomy Information  Trach Brand: Portex Bivona  Size:  7.0   60A170 Style: Uncuffed Secured by: Velcro   Procedure: Trach changed and Trach Cleaning    Post Procedure Tracheostomy Information  Trach Brand: Portex Bivona Size:  7.0 60A170 Style: Uncuffed Secured by: Velcro   Post Procedure Evaluation:  ETCO2 positive color change from yellow to purple : Yes.   Vital signs:VSS Patients current condition: stable Complications: No apparent complications Trach site exam: clean, dry Wound care done: gauze  drain Patient did tolerate procedure well.   Education: none  Prescription needs: none    Additional needs: New PMV given at this visit  additional new trach 60A170 and box of inner cannula

## 2023-04-09 ENCOUNTER — Ambulatory Visit: Payer: MEDICAID

## 2023-04-09 DIAGNOSIS — M542 Cervicalgia: Secondary | ICD-10-CM

## 2023-04-09 DIAGNOSIS — M6281 Muscle weakness (generalized): Secondary | ICD-10-CM

## 2023-04-09 DIAGNOSIS — R293 Abnormal posture: Secondary | ICD-10-CM

## 2023-04-09 NOTE — Therapy (Signed)
 OUTPATIENT PHYSICAL THERAPY TREATMENT   Patient Name: David [name] MRN: [id number] DOB:06-Dec-1975, 48 y.o., male Today's Date: 04/09/2023  END OF SESSION:  PT End of Session - 04/09/23 0837     Visit Number 11    Date for PT Re-Evaluation 05/05/23    Authorization Type trillium approved 8 visits 03/11/23-05/05/23    Authorization Time Period 01/20/2023-03/10/2023    Authorization - Visit Number 3    Authorization - Number of Visits 8    PT Start Time 0800    PT Stop Time 0829    PT Time Calculation (min) 29 min    Activity Tolerance Patient tolerated treatment well    Behavior During Therapy [facility] for tasks assessed/performed                        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)    Pneumococcal bacteremia 06/09/2021   Pneumothorax    Left lung spontaneous pneumothorax at age 56 yr    S/P percutaneous endoscopic gastrostomy (PEG) tube placement (HCC) 06/24/2021   Past Surgical History:  Procedure Laterality Date   chest tubes Left    IR GASTROSTOMY TUBE MOD SED  06/18/2021   LAPAROSCOPIC INSERTION GASTROSTOMY TUBE N/A 06/20/2021   Procedure: LAPAROSCOPIC INSERTION GASTROSTOMY TUBE;  Surgeon: Berna Bue, MD;  Location: [facility] OR;  Service: General;  Laterality: N/A;  DOW PATIENT   TRACHEOSTOMY TUBE PLACEMENT N/A 06/09/2021   Procedure: AWAKE TRACHEOSTOMY;  Surgeon: Christia Reading, MD;  Location: [facility] OR;  Service: ENT;  Laterality: N/A;   Patient Active  Problem List   Diagnosis Date Noted   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 04/03/2023   Difficulty urinating 03/27/2023   Subclinical hypothyroidism 02/10/2023   FTT (failure to thrive) in adult 12/31/2022   Dysphagia, unspecified 12/31/2022   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 12/31/2022   Hepatitis A immune 11/14/2022   End of life care 10/23/2022   Acute purulent bronchitis 10/23/2022   Hepatitis C 10/09/2022   Left leg pain 10/09/2022   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   History of pancytopenia 09/12/2022   Torticollis 07/04/2022   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   Defective dental restoration 12/31/2021   Chronic periodontitis 12/31/2021   Impacted third molar tooth 12/31/2021   Irritant contact dermatitis associated with digestive stoma 12/25/2021   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   Continuous tobacco abuse 11/21/2021   Dermatitis associated with moisture 10/16/2021   Back pain 08/16/2021   Candidal endocarditis    History of radiation to head and neck region 07/11/2021   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   Dysgeusia 07/11/2021   Pressure injury of skin 06/17/2021   Tracheostomy status (HCC)    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   Heart failure with reduced ejection fraction (HCC) 04/28/2021   Anxiety 04/28/2021   Elevated troponin 04/01/2021   Elevated transaminase level 04/01/2021   Weight loss, unintentional 01/15/2021  Protein-calorie malnutrition, severe (HCC) 01/10/2021   Hypotension 01/08/2021   Chemotherapy induced nausea and vomiting 01/01/2021   Encounter for dental examination 12/13/2020   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   Teeth missing 12/04/2020   Radiation caries 12/04/2020   Retained tooth root 12/04/2020   Chronic apical periodontitis 12/04/2020   Accretions on teeth 12/04/2020    PCP: Adron Bene, MD   REFERRING PROVIDER:  Glee Arvin, NP   REFERRING DIAG: Diagnosis Information  Diagnosis  C32.9 (ICD-10-CM) - Laryngeal cancer (HCC)    THERAPY DIAG:  Cervicalgia  Muscle weakness (generalized)  Abnormal posture  Rationale for Evaluation and Treatment: Rehabilitation  ONSET DATE: 1.5 year history (trach placement)  SUBJECTIVE:                                                                                                                                                                                                         SUBJECTIVE STATEMENT: I'm doing my exercises sometimes.  Sore right now but not painful.   From Eval: Pt presents to PT with Rt sided neck tension and shortened tissue secondary to radiation.  Pt has difficulty holding up his head due to pain, postural weakness, and shortened tissue on the Rt due to radiation.  Pt was discharged from PT in August due to clot in Rt jugular vein.  Clot is now resolved.  He reports that he has been stretching and working on posture at home.   Hand dominance: Right  PERTINENT HISTORY:  Tracheostomy, radiation to neck, malignant neoplasm of supraglottis, thrombosis of Rt internal jugular vein, torticollis, heart failure  PAIN: 04/09/2023 Are you having pain? Yes: NPRS scale: 0/10, just stiff Pain location: Rt neck Pain description: stiff, throbbing Aggravating factors: overstretching, trying to stretch  Relieving factors: massaging, medication  PRECAUTIONS: Other: cancer, trach  WEIGHT BEARING RESTRICTIONS: No  FALLS:  Has patient fallen in last 6 months? No  LIVING ENVIRONMENT: Lives with: lives alone Lives in: House/apartment   PLOF: Independent, walking to mailbox  PATIENT GOALS: reduce neck pain, improve mobility   NEXT MD VISIT:  January 2025  OBJECTIVE:   DIAGNOSTIC FINDINGS:  None   PATIENT SURVEYS:  NDI 15/50 03/10/23: 6 min walk test: 1135 feet (age related norm in healthy male is 2339 ft)- RPE 5/10 The  Patient-Specific Functional Scale (PSFS)  Initial:  I am going to ask you to identify up to 3 important activities that you are unable to do or are having difficulty with as a result of this problem.  Today are there any activities that  you are unable to do or having difficulty with because of this?  (Patient shown scale and patient rated each activity)  Follow up: When you first came in you had difficulty performing these activities.  Today do you still have difficulty?  Patient-Specific activity scoring scheme (Point to one number):  0 1 2 3 4 5 6 7 8 9  10 Unable                                                                                                          Able to perform To perform                                                                                                    activity at the same Activity         Level as before                                                                                                                       Injury or problem  Activity   Taking out trash                                                   Initial:  8/10                     follow up:     COGNITION: Overall cognitive status: Within functional limits for tasks assessed  SENSATION: WFL  POSTURE: rounded shoulders, forward head, increased thoracic kyphosis, and flexed trunk   PALPATION: Significant tension and reduced muscle length in bil neck. Fixed cervical spine segments with minimal mobility allowed.    CERVICAL ROM:   Active ROM A/PROM (deg) 01/13/23  Flexion Rests in 30 degrees flexion  Extension   Right lateral flexion Rests in 30 degrees  Left lateral flexion 10 degrees Rt lateral flexion  Right rotation 50 degrees   Left rotation 25 degrees    (Blank rows = not tested)  UPPER EXTREMITY ROM:  UE A/ROM is WFLs without pain  UPPER EXTREMITY MMT: 4+/5 bil UE strength, neck strength 4-/5 throughout   TODAY'S TREATMENT:                                                                                                                               DATE:  04/09/23 NuStep: Level 4-arms and legs with cues to keep head upright x 6 minutes- PT present to discuss progress Sit to stand with 5# kettlebell chest press 2x10 Shoulder flexion and scaption: 2# 2x10 bil each Standing rows and shoulder extension with red band: 2x10 bil- good control today Farmer's carry with 5# kettlebell - 1 lap in each hand   04/01/23 NuStep: Level 4-arms and legs with cues to keep head upright x 8 minutes- PT present to discuss progress Open book stretch sidelying x10, low trunk rotation 10" hold x3 each side Sit to stand with 8# dumbbell chest press x10, 7# x10 Seated row: 15# 2x10 Walking in reverse 10# x10 Shoulder flexion and scaption: 2# 2x10 bil each Standing rows and shoulder extension with green band: 2x10 bil- good control today  Manual: IASTM with small blade to Rt and Lt upper trap, cervical paraspinals, suboccipitals and levator- brush strokes and fanning   03/24/23 NuStep: Level 4-arms and legs with cues to keep head upright x 8 minutes- PT present to discuss progress Sit to stand with 5# kettlebell chest press 2x10 Shoulder flexion and scaption: 2# 2x10 bil each Horizontal abduction: red band 2x10 Standing rows and shoulder extension with red band: 2x10 bil- good control today Farmer's carry with 5# kettlebell - 1 lap in each hand   Manual: IASTM with small blade to Rt and Lt upper trap and levator- brush strokes and fanning   PATIENT EDUCATION:  Education details: Access Code: [email or web address] Person educated: Patient Education method: Explanation, Demonstration, and Handouts Education comprehension: verbalized understanding and returned demonstration  HOME EXERCISE PROGRAM: Access Code: [email or web address] URL: [email or web address] Date: 03/05/2023 Prepared by: Tresa Endo  Exercises - Seated Scapular Retraction  - 5 x daily - 7 x weekly - 1 sets - 10  reps - 5 hold - Supine Scapular Retraction  - 3 x daily - 7 x weekly - 1 sets - 10 reps - 5 hold - Seated Correct Posture  - 1 x daily - 7 x weekly - 3 sets - 10 reps - Seated Cervical Retraction  - 1 x daily - 7 x weekly - 3 sets - 10 reps - Supine Chin Tuck  - 1 x daily - 7 x weekly - 3 sets - 10 reps - Seated Cervical Sidebending AROM  - 3 x daily - 7 x weekly - 1 sets - 10 reps - 5-10 hold - Seated Assisted Cervical Rotation with Towel  - 5 x daily - 7 x weekly - 1 sets - 3 reps - 10 hold - Shoulder External Rotation and Scapular Retraction with Resistance  -  1 x daily - 7 x weekly - 2 sets - 10 reps - Standing Shoulder Row with Anchored Resistance  - 1 x daily - 7 x weekly - 2 sets - 10 reps - Shoulder extension with resistance - Neutral  - 1 x daily - 7 x weekly - 2 sets - 10 reps - Sit to Stand Without Arm Support  - 2 x daily - 7 x weekly - 2 sets - 5 reps  ASSESSMENT:  CLINICAL IMPRESSION: Pt expressed frustration regarding his current condition.  He has not been as active lately at home.  He continues to be  challenged by increased weight today and new activity with weight machines.  He is significantly deconditioned due to chronic illness and PT will advance physical activity while incorporating improved postural alignment and endurance. Pt requested to stop session early today due to fatigue and not in a good head space.  Patient will benefit from skilled PT to address the below impairments and improve overall function.  OBJECTIVE IMPAIRMENTS: decreased activity tolerance, increased muscle spasms, impaired flexibility, postural dysfunction, and pain.   ACTIVITY LIMITATIONS: sitting, standing, and reach over head  PARTICIPATION LIMITATIONS: meal prep, driving, and community activity  PERSONAL FACTORS: 1-2 comorbidities: cancer on pallative care, chronic postural dysfunction  are also affecting patient's functional outcome.   REHAB POTENTIAL: Good  CLINICAL DECISION MAKING:  Stable/uncomplicated  EVALUATION COMPLEXITY: Moderate   GOALS: Goals reviewed with patient? Yes  SHORT TERM GOALS: Target date: 02/10/2023      Be independent in initial  HEP Baseline:  Goal status: MET  2.  Verbalize and demonstrate postural corrections to improve tolerance for neutral posture and reduced neck strain  Baseline:  Goal status: MET   LONG TERM GOALS: Target date: 05/05/23    Be independent in advanced HEP Baseline:  Goal status: In progress   3.  Improve taking out  the trash to 9/10 on PSFS due to improved strength and endurance  Baseline: 8/10 Goal status: INITIAL  3.   Perform independent postural correction and hold this x 5-10 minutes  during activity  Baseline: needs visual feedback and can hold 2-3 min Goal status: NEW   4.   Improve 6 min walk test to 1700 feet to improve community function  Baseline: 1135 feet (03/10/23) Goal status: NEW   5.  Initiate a regular walking program and verbalize safe progression for endurance gains   Goal status: NEW    PLAN:  PT FREQUENCY: 1x/week  PT DURATION: 8 weeks  PLANNED INTERVENTIONS: Therapeutic exercises, Therapeutic activity, Neuromuscular re-education, Balance training, Gait training, Patient/Family education, Self Care, Joint mobilization, Aquatic Therapy, Dry Needling, Electrical stimulation, Spinal mobilization, Cryotherapy, Moist heat, Taping, Traction, Manual therapy, and Re-evaluation  PLAN FOR NEXT SESSION: Address cervical alignment, mobility, posture and functional mobility.  Test 6 min walk test again.    Lorrene Reid, PT 04/09/23 8:41 AM  [facility] [address], [address] [place], [place] [address] Phone # [phone] Fax [phone]

## 2023-04-14 NOTE — Progress Notes (Unsigned)
 Palliative Medicine [facility]  [phone] [phone]   Name: David [name] Date: 04/14/2023 MRN: [id number]  DOB: [birth year]  Patient Care Team: Faith Rogue, DO as PCP - General (Internal Medicine) [name], Lise Auer, RN as Oncology Nurse Navigator [name], Skeet Latch, DO as Consulting Physician (Otolaryngology) Lonie Peak, MD as Consulting Physician (Radiation Oncology) Rachel Moulds, MD as Consulting Physician (Hematology and Oncology) Pickenpack-Cousar, Arty Baumgartner, NP as Nurse Practitioner (Nurse Practitioner)   REASON FOR CONSULTATION: David [name] is a 48 y.o. 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 He was recently hospitalized for several months at [facility] where he received treatment for fungal endocarditis and required emergent tracheostomy and PEG tube. During hospitalization CT of chest showed concerns for residual vs recurrent tumor. Palliative requested to re-engage for ongoing support and goals of care.   SOCIAL HISTORY:     reports that he has been smoking cigarettes. He has never used smokeless tobacco. He reports that he does not currently use alcohol after a past usage of about 12.0 standard drinks of alcohol per week. He reports that he does not currently use drugs after having used the following drugs: IV.  ADVANCE DIRECTIVES:  Patient reports he does not have advanced directives.  Would like to further discuss in the future.  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)    Pneumococcal bacteremia 06/09/2021   Pneumothorax    Left lung spontaneous pneumothorax at age 39 yr    S/P percutaneous endoscopic gastrostomy (PEG) tube placement (HCC) 06/24/2021    PAST SURGICAL HISTORY:  Past Surgical History:  Procedure Laterality Date   chest tubes Left    IR GASTROSTOMY TUBE MOD SED  06/18/2021   LAPAROSCOPIC INSERTION GASTROSTOMY TUBE N/A 06/20/2021   Procedure: LAPAROSCOPIC INSERTION GASTROSTOMY TUBE;  Surgeon: Berna Bue, MD;  Location: [facility] OR;  Service: General;  Laterality: N/A;  DOW PATIENT   TRACHEOSTOMY TUBE PLACEMENT N/A 06/09/2021   Procedure: AWAKE TRACHEOSTOMY;  Surgeon: Christia Reading, MD;  Location: [facility] OR;  Service: ENT;  Laterality: N/A;      ALLERGIES:  has no known allergies.  MEDICATIONS:  Current Outpatient Medications  Medication Sig Dispense Refill   apixaban (ELIQUIS) 5 MG TABS tablet TAKE 1 TABLET BY MOUTH TWICE A DAY 180 tablet 3   diazePAM 5 MG/5ML SOLN TAKE 2 MLS (2 MG TOTAL) BY MOUTH EVERY 8 (EIGHT) HOURS AS NEEDED FOR MUSCLE SPASMS (DYSPHAGIA). 100 mL 0   fluconazole (DIFLUCAN) 200 MG tablet TAKE 4 TABLETS (800 MG TOTAL) BY MOUTH DAILY. 120 tablet 2   Glecaprevir-Pibrentasvir 100-40 MG TABS Take 1 tablet by mouth daily.     lidocaine (XYLOCAINE) 2 % solution Use as directed 15 mLs in the mouth or throat every 3 (three) hours as needed for  mouth pain. Mix 1part 2% viscous lidocaine, 1part water. Swish and spit 30mL of total diluted mixture up to eight times a day, prn as needed for soreness 200 mL 2   midodrine (PROAMATINE) 5 MG tablet TAKE 1 TABLET (5 MG TOTAL) BY MOUTH 3 (THREE) TIMES DAILY WITH  MEALS. 270 tablet 2   ondansetron (ZOFRAN) 4 MG tablet Take 1 tablet (4 mg total) by mouth every 8 (eight) hours as needed for nausea or vomiting. 30 tablet 1   Pregabalin 20 MG/ML SOLN Take 50 mg by mouth 3 (three) times daily. 160 mL 1   sertraline (ZOLOFT) 20 MG/ML concentrated solution Mix 1.25 mLs (25 mg total)  with 1/2 cup of water and take by mouth daily. 60 mL 2   No current facility-administered medications for this visit.      Latest Ref Rng & Units 12/13/2022    2:10 PM 11/13/2022    3:40 PM 11/08/2022    8:59 AM  CBC  WBC 4.0 - 10.5 K/uL 4.8  4.0  7.7   Hemoglobin 13.0 - 17.0 g/dL 9.6  86.5  78.4   Hematocrit 39.0 - 52.0 % 31.2  35.6  40.8   Platelets 150 - 400 K/uL 108  160  249        Latest Ref Rng & Units 12/13/2022    2:10 PM 11/29/2022   10:00 AM 11/13/2022    3:40 PM  CMP  Glucose 70 - 99 mg/dL 696  92  49   BUN 6 - 20 mg/dL 16  12  11    Creatinine 0.61 - 1.24 mg/dL 2.95  2.84  1.32   Sodium 135 - 145 mmol/L 135  135  138   Potassium 3.5 - 5.1 mmol/L 3.4  4.0  3.7   Chloride 98 - 111 mmol/L 101  96  97   CO2 22 - 32 mmol/L 28  26  24    Calcium 8.9 - 10.3 mg/dL 8.7  9.1  9.0   Total Protein 6.5 - 8.1 g/dL  7.1  7.1   Total Bilirubin 0.3 - 1.2 mg/dL  0.3  <4.4   Alkaline Phos 38 - 126 U/L  67  81   AST 15 - 41 U/L  31  61   ALT 0 - 44 U/L  17  23     VITAL SIGNS: There were no vitals taken for this visit. There were no vitals filed for this visit.     Estimated body mass index is 15.58 kg/m as calculated from the following:   Height as of 02/10/23: 5\' 10"  (1.778 m).   Weight as of 03/18/23: 108 lb 9.6 oz (49.3 kg).  PERFORMANCE STATUS (ECOG) : 1 - Symptomatic but completely ambulatory  Assessment NAD, thin  RRR Normal breathing pattern, trach in place Right neck contracture, dependent edema Alert and oriented x3  Discussed the use of AI scribe software for clinical note transcription with the patient, who gave verbal consent to proceed.    IMPRESSION:  Mr. David Ligman "[name]" is a 48 year old male who presents with medication and symptom management. He is alone today reporting his mother does not feel well and is waiting in the car. He is doing well overall. Denies nausea, vomiting, constipation, or diarrhea. Remains active as possible. Reports his appetite has improved. Much appreciative of this and recent weight gain. Current weight is up to 108lbs from 100lbs 1/6, and 88lbs 11/22.   [name] states now that he is feeling much  better, he would like to get a better grasp on his health. He wants to have dental work done, which he previously postponed. He has concerns about his urinary function, stating it takes a long time to urinate. He has not seen a urologist for this issue and is worried about potential kidney or prostate problems. Plans to follow up with a PCP to further evaluate.   He is tolerating all medications. Reports pain and anxiety is well controlled with use of Pregabalin and diazepam. We discussed continued use. He states difficulty with measuring out medication given syringe is not big enough. Medicine cup provided with ability to measure up to 80ml. We will continue to closely monitor and adjust regimen as needed. Attending methadone clinic daily.   He describes significant fatigue, falling asleep during the day, even while eating, and waking up with food on his chest. Education provided on the risk of aspiration. He verbalized understanding. He attributes this fatigue to untreated hepatitis C and suspects it might be causing his weakness. He also has difficulty sleeping at night, which he connects to his daytime napping. He has not taken his antiviral medication due to lack of refill. Advise to reach out to ID (Dr. Ninetta Lights) to assist with this. [name] also expresses wishes to have further lab work to assess his hepatitis status. Again, advised this requires management from his infectious disease providers and their  expertise. He verbalized understanding.   All questions answered and support provided.   Assessment and Plan  Chronic Pain Some confusion about dosing volume. Tolerating without difficulty.  -Provide a measuring cup for accurate dosing. -Pregabalin 50mg  three times daily. -Continue with daily support at methadone clinic.   Hepatitis C Not currently receiving treatment and experiencing symptoms of fatigue. -Contact infectious disease specialist (Dr. Ninetta Lights) to discuss restarting treatment and arrange for viral load testing.  Urinary symptoms New onset difficulty with urination, no prior urology evaluation. -Refer to PCP for possible urology evaluation of symptoms.   Dental concerns Expressed interest in dental work. -Encourage patient to schedule dental appointment.  Weight gain Noted 20lb weight gain since November. -Positive progress, continue current dietary habits.  I will plan to follow-up in 3-4 weeks.   Any controlled substances utilized were prescribed in the context of palliative care. PDMP has been reviewed.    Visit consisted of counseling and education dealing with the complex and emotionally intense issues of symptom management and palliative care in the setting of serious and potentially life-threatening illness.  Willette Alma, AGPCNP-BC  Palliative Medicine Team/[facility]

## 2023-04-15 ENCOUNTER — Telehealth: Payer: Self-pay | Admitting: Student

## 2023-04-15 ENCOUNTER — Inpatient Hospital Stay: Payer: MEDICAID | Attending: Hematology and Oncology | Admitting: Nurse Practitioner

## 2023-04-15 ENCOUNTER — Encounter: Payer: Self-pay | Admitting: Nurse Practitioner

## 2023-04-15 VITALS — BP 141/95 | HR 96 | Temp 98.7°F | Resp 18 | Wt 105.0 lb

## 2023-04-15 DIAGNOSIS — G893 Neoplasm related pain (acute) (chronic): Secondary | ICD-10-CM

## 2023-04-15 DIAGNOSIS — Z515 Encounter for palliative care: Secondary | ICD-10-CM

## 2023-04-15 DIAGNOSIS — F419 Anxiety disorder, unspecified: Secondary | ICD-10-CM

## 2023-04-15 DIAGNOSIS — C329 Malignant neoplasm of larynx, unspecified: Secondary | ICD-10-CM

## 2023-04-15 DIAGNOSIS — M62838 Other muscle spasm: Secondary | ICD-10-CM

## 2023-04-15 MED ORDER — DIAZEPAM 5 MG/5ML PO SOLN: 3.0000 mg | Freq: Three times a day (TID) | ORAL | 0 refills | Status: DC | PRN

## 2023-04-15 NOTE — Telephone Encounter (Signed)
 Copied from CRM [id number]. Topic: Referral - Request for Referral >> Apr 15, 2023  9:29 AM Hamdi H wrote: Did the patient discuss referral with their provider in the last year? Yes (If No - schedule appointment) (If Yes - send message)  Appointment offered? Yes pt had telehealth appt 2/27 for this but still hasn't heard back about the referral.   Type of order/referral and detailed reason for visit: Urology Appt   Preference of office, provider, location: None   If referral order, have you been seen by this specialty before? No (If Yes, this issue or another issue? When? Where?  Can we respond through MyChart? No Call mom back at [phone] Notification History    RecipientAction Referred To ProviderFax Generated 03/27/2499:05:56 PM   Latest Notification   03/27/2499:05:56 PM Shon Hough FReferred To Provider Delivery Method  Clinical Attachment Action  Fax Generated PCP Type  -- Payer  -- Fax Number  [phone]  Referrals Attachment: Jacob Moores ([phone] kB)  Preview      Rtn call to the pt's caregiver  his mother "[name]".  Reassured her the notes were already sent to [facility] and Provided  the office number of [phone] for her to contact

## 2023-04-16 ENCOUNTER — Ambulatory Visit: Payer: MEDICAID

## 2023-04-16 DIAGNOSIS — M6281 Muscle weakness (generalized): Secondary | ICD-10-CM

## 2023-04-16 DIAGNOSIS — M542 Cervicalgia: Secondary | ICD-10-CM

## 2023-04-16 DIAGNOSIS — R293 Abnormal posture: Secondary | ICD-10-CM

## 2023-04-16 NOTE — Therapy (Signed)
 OUTPATIENT PHYSICAL THERAPY TREATMENT   Patient Name: David [name] MRN: [id number] DOB:10-Jul-1975, 48 y.o., male Today's Date: 04/16/2023  END OF SESSION:  PT End of Session - 04/16/23 0843     Visit Number 12    Date for PT Re-Evaluation 05/05/23    Authorization Type trill[employer] approved 8 visits 03/11/23-05/05/23    Authorization Time Period 01/20/2023-03/10/2023    Authorization - Visit Number 4    Authorization - Number of Visits 8    PT Start Time 0820    PT Stop Time 0843    PT Time Calculation (min) 23 min    Activity Tolerance Patient tolerated treatment well    Behavior During Therapy [facility] for tasks assessed/performed                         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)    Pneumococcal bacteremia 06/09/2021   Pneumothorax    Left lung spontaneous pneumothorax at age 48 yr    S/P percutaneous endoscopic gastrostomy (PEG) tube placement (HCC) 06/24/2021   Past Surgical History:  Procedure Laterality Date   chest tubes Left    IR GASTROSTOMY TUBE MOD SED  06/18/2021   LAPAROSCOPIC INSERTION GASTROSTOMY TUBE N/A 06/20/2021   Procedure: LAPAROSCOPIC INSERTION GASTROSTOMY TUBE;  Surgeon: Berna Bue, MD;  Location: [facility] OR;  Service: General;  Laterality: N/A;  DOW PATIENT   TRACHEOSTOMY TUBE PLACEMENT N/A 06/09/2021   Procedure: AWAKE TRACHEOSTOMY;  Surgeon: Christia Reading, MD;  Location: [facility] OR;  Service: ENT;  Laterality: N/A;   Patient Active  Problem List   Diagnosis Date Noted   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 04/03/2023   Difficulty urinating 03/27/2023   Subclinical hypothyroidism 02/10/2023   FTT (failure to thrive) in adult 12/31/2022   Dysphagia, unspecified 12/31/2022   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 12/31/2022   Hepatitis A immune 11/14/2022   End of life care 10/23/2022   Acute purulent bronchitis 10/23/2022   Hepatitis C 10/09/2022   Left leg pain 10/09/2022   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   History of pancytopenia 09/12/2022   Torticollis 07/04/2022   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   Defective dental restoration 12/31/2021   Chronic periodontitis 12/31/2021   Impacted third molar tooth 12/31/2021   Irritant contact dermatitis associated with digestive stoma 12/25/2021   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   Continuous tobacco abuse 11/21/2021   Dermatitis associated with moisture 10/16/2021   Back pain 08/16/2021   Candidal endocarditis    History of radiation to head and neck region 07/11/2021   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   Dysgeusia 07/11/2021   Pressure injury of skin 06/17/2021   Tracheostomy status (HCC)    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   Heart failure with reduced ejection fraction (HCC) 04/28/2021   Anxiety 04/28/2021   Elevated troponin 04/01/2021   Elevated transaminase level 04/01/2021   Weight loss, unintentional 01/15/2021  Protein-calorie malnutrition, severe (HCC) 01/10/2021   Hypotension 01/08/2021   Chemotherapy induced nausea and vomiting 01/01/2021   Encounter for dental examination 12/13/2020   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   Teeth missing 12/04/2020   Radiation caries 12/04/2020   Retained tooth root 12/04/2020   Chronic apical periodontitis 12/04/2020   Accretions on teeth 12/04/2020    PCP: Adron Bene, MD   REFERRING PROVIDER:  Glee Arvin, NP   REFERRING DIAG: Diagnosis Information  Diagnosis  C32.9 (ICD-10-CM) - Laryngeal cancer (HCC)    THERAPY DIAG:  Cervicalgia  Muscle weakness (generalized)  Abnormal posture  Rationale for Evaluation and Treatment: Rehabilitation  ONSET DATE: 1.5 year history (trach placement)  SUBJECTIVE:                                                                                                                                                                                                         SUBJECTIVE STATEMENT: I am weak from not getting sleep so I can't exercise today.  I'm overheated.  I just want to do manual for my neck.  20 min late due to transportation.   From Eval: Pt presents to PT with Rt sided neck tension and shortened tissue secondary to radiation.  Pt has difficulty holding up his head due to pain, postural weakness, and shortened tissue on the Rt due to radiation.  Pt was discharged from PT in August due to clot in Rt jugular vein.  Clot is now resolved.  He reports that he has been stretching and working on posture at home.   Hand dominance: Right  PERTINENT HISTORY:  Tracheostomy, radiation to neck, malignant neoplasm of supraglottis, thrombosis of Rt internal jugular vein, torticollis, heart failure  PAIN: 04/09/2023 Are you having pain? Yes: NPRS scale: 0/10, just stiff Pain location: Rt neck Pain description: stiff, throbbing Aggravating factors: overstretching, trying to stretch  Relieving factors: massaging, medication  PRECAUTIONS: Other: cancer, trach  WEIGHT BEARING RESTRICTIONS: No  FALLS:  Has patient fallen in last 6 months? No  LIVING ENVIRONMENT: Lives with: lives alone Lives in: House/apartment   PLOF: Independent, walking to mailbox  PATIENT GOALS: reduce neck pain, improve mobility   NEXT MD VISIT:  January 2025  OBJECTIVE:   DIAGNOSTIC FINDINGS:  None   PATIENT SURVEYS:  NDI 15/50 03/10/23: 6 min  walk test: 1135 feet (age related norm in healthy male is 2339 ft)- RPE 5/10 The Patient-Specific Functional Scale (PSFS)  Initial:  I am going to ask you to identify up to 3 important activities that you are unable  to do or are having difficulty with as a result of this problem.  Today are there any activities that you are unable to do or having difficulty with because of this?  (Patient shown scale and patient rated each activity)  Follow up: When you first came in you had difficulty performing these activities.  Today do you still have difficulty?  Patient-Specific activity scoring scheme (Point to one number):  0 1 2 3 4 5 6 7 8 9  10 Unable                                                                                                          Able to perform To perform                                                                                                    activity at the same Activity         Level as before                                                                                                                       Injury or problem  Activity   Taking out trash                                                   Initial:  8/10                     follow up:     COGNITION: Overall cognitive status: Within functional limits for tasks assessed  SENSATION: WFL  POSTURE: rounded shoulders, forward head, increased thoracic kyphosis, and flexed trunk   PALPATION: Significant tension and reduced muscle length in bil neck. Fixed cervical spine segments with minimal mobility allowed.    CERVICAL ROM:   Active ROM A/PROM (deg) 01/13/23  Flexion Rests in 30 degrees flexion  Extension   Right lateral flexion Rests in 30 degrees  Left lateral flexion 10 degrees Rt lateral flexion  Right rotation 50 degrees   Left rotation 25 degrees    (Blank rows = not tested)  UPPER EXTREMITY ROM:  UE A/ROM is WFLs without pain  UPPER EXTREMITY MMT: 4+/5 bil UE strength,  neck strength 4-/5 throughout   TODAY'S TREATMENT:                                                                                                                              DATE:  04/16/23:  Manual: elongation and movement with mobs to neck.    04/09/23 NuStep: Level 4-arms and legs with cues to keep head upright x 6 minutes- PT present to discuss progress Sit to stand with 5# kettlebell chest press 2x10 Shoulder flexion and scaption: 2# 2x10 bil each Standing rows and shoulder extension with red band: 2x10 bil- good control today Farmer's carry with 5# kettlebell - 1 lap in each hand   04/01/23 NuStep: Level 4-arms and legs with cues to keep head upright x 8 minutes- PT present to discuss progress Open book stretch sidelying x10, low trunk rotation 10" hold x3 each side Sit to stand with 8# dumbbell chest press x10, 7# x10 Seated row: 15# 2x10 Walking in reverse 10# x10 Shoulder flexion and scaption: 2# 2x10 bil each Standing rows and shoulder extension with green band: 2x10 bil- good control today  Manual: IASTM with small blade to Rt and Lt upper trap, cervical paraspinals, suboccipitals and levator- brush strokes and fanning   PATIENT EDUCATION:  Education details: Access Code: [email or web address] Person educated: Patient Education method: Explanation, Demonstration, and Handouts Education comprehension: verbalized understanding and returned demonstration  HOME EXERCISE PROGRAM: Access Code: [email or web address] URL: [email or web address] Date: 03/05/2023 Prepared by: Tresa Endo  Exercises - Seated Scapular Retraction  - 5 x daily - 7 x weekly - 1 sets - 10 reps - 5 hold - Supine Scapular Retraction  - 3 x daily - 7 x weekly - 1 sets - 10 reps - 5 hold - Seated Correct Posture  - 1 x daily - 7 x weekly - 3 sets - 10 reps - Seated Cervical Retraction  - 1 x daily - 7 x weekly - 3 sets - 10 reps - Supine Chin Tuck  - 1 x daily - 7 x weekly - 3 sets - 10 reps - Seated Cervical  Sidebending AROM  - 3 x daily - 7 x weekly - 1 sets - 10 reps - 5-10 hold - Seated Assisted Cervical Rotation with Towel  - 5 x daily - 7 x weekly - 1 sets - 3 reps - 10 hold - Shoulder External Rotation and Scapular Retraction with Resistance  - 1 x daily - 7 x weekly - 2 sets - 10 reps - Standing Shoulder Row with Anchored Resistance  - 1 x daily - 7 x weekly - 2 sets - 10 reps - Shoulder extension with resistance - Neutral  - 1 x daily - 7 x weekly - 2  sets - 10 reps - Sit to Stand Without Arm Support  - 2 x daily - 7 x weekly - 2 sets - 5 reps  ASSESSMENT:  CLINICAL IMPRESSION: Pt arrived 20 min late and reported that he was overheated from the ride here and felt weak and requested no exercise today.  Session focused on elongation and mobilization of neck to assist with tight muscles and alignment.  Pt will work to remain active at home.  Patient will benefit from skilled PT to address the below impairments and improve overall function.  OBJECTIVE IMPAIRMENTS: decreased activity tolerance, increased muscle spasms, impaired flexibility, postural dysfunction, and pain.   ACTIVITY LIMITATIONS: sitting, standing, and reach over head  PARTICIPATION LIMITATIONS: meal prep, driving, and community activity  PERSONAL FACTORS: 1-2 comorbidities: cancer on pallative care, chronic postural dysfunction  are also affecting patient's functional outcome.   REHAB POTENTIAL: Good  CLINICAL DECISION MAKING: Stable/uncomplicated  EVALUATION COMPLEXITY: Moderate   GOALS: Goals reviewed with patient? Yes  SHORT TERM GOALS: Target date: 02/10/2023      Be independent in initial  HEP Baseline:  Goal status: MET  2.  Verbalize and demonstrate postural corrections to improve tolerance for neutral posture and reduced neck strain  Baseline:  Goal status: MET   LONG TERM GOALS: Target date: 05/05/23    Be independent in advanced HEP Baseline:  Goal status: In progress   3.  Improve taking out   the trash to 9/10 on PSFS due to improved strength and endurance  Baseline: 8/10 Goal status: INITIAL  3.   Perform independent postural correction and hold this x 5-10 minutes  during activity  Baseline: needs visual feedback and can hold 2-3 min Goal status: NEW   4.   Improve 6 min walk test to 1700 feet to improve community function  Baseline: 1135 feet (03/10/23) Goal status: NEW   5.  Initiate a regular walking program and verbalize safe progression for endurance gains   Goal status: NEW    PLAN:  PT FREQUENCY: 1x/week  PT DURATION: 8 weeks  PLANNED INTERVENTIONS: Therapeutic exercises, Therapeutic activity, Neuromuscular re-education, Balance training, Gait training, Patient/Family education, Self Care, Joint mobilization, Aquatic Therapy, Dry Needling, Electrical stimulation, Spinal mobilization, Cryotherapy, Moist heat, Taping, Traction, Manual therapy, and Re-evaluation  PLAN FOR NEXT SESSION: Address cervical alignment, mobility, posture and functional mobility.  Test 6 min walk test again.    Lorrene Reid, PT 04/16/23 8:51 AM  [facility] [address] [place], [place] [address] Phone # [phone] Fax [phone]

## 2023-04-23 ENCOUNTER — Other Ambulatory Visit: Payer: Self-pay | Admitting: Nurse Practitioner

## 2023-04-23 ENCOUNTER — Telehealth: Payer: Self-pay

## 2023-04-23 ENCOUNTER — Ambulatory Visit: Payer: MEDICAID

## 2023-04-23 DIAGNOSIS — M542 Cervicalgia: Secondary | ICD-10-CM

## 2023-04-23 DIAGNOSIS — R293 Abnormal posture: Secondary | ICD-10-CM

## 2023-04-23 DIAGNOSIS — M6281 Muscle weakness (generalized): Secondary | ICD-10-CM

## 2023-04-23 NOTE — Therapy (Addendum)
 OUTPATIENT PHYSICAL THERAPY TREATMENT   Patient Name: Darey Hershberger MRN: [id number] DOB:[birth year], 48 y.o., male Today's Date: 05/05/2023  END OF SESSION:  PT End of Session - 04/23/23 0842     Visit Number 13    Date for PT Re-Evaluation 06/27/23    Authorization Type trillium approved 8 visits 03/11/23-05/05/23    Authorization - Visit Number 5    Authorization - Number of Visits 8    PT Start Time 0800    PT Stop Time 0840    PT Time Calculation (min) 40 min    Activity Tolerance Patient tolerated treatment well    Behavior During Therapy [facility] for tasks assessed/performed                          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)    Pneumococcal bacteremia 06/09/2021   Pneumothorax    Left lung spontaneous pneumothorax at age 19 yr    S/P percutaneous endoscopic gastrostomy (PEG) tube placement (HCC) 06/24/2021   Past Surgical History:  Procedure Laterality Date   chest tubes Left    IR GASTROSTOMY TUBE MOD SED  06/18/2021   LAPAROSCOPIC INSERTION GASTROSTOMY TUBE N/A 06/20/2021   Procedure: LAPAROSCOPIC INSERTION GASTROSTOMY TUBE;  Surgeon: Berna Bue, MD;  Location: [facility] OR;  Service: General;  Laterality: N/A;  DOW PATIENT   TRACHEOSTOMY TUBE PLACEMENT N/A 06/09/2021   Procedure: AWAKE TRACHEOSTOMY;  Surgeon: Christia Reading, MD;  Location: [facility] OR;  Service: ENT;  Laterality: N/A;   Patient Active Problem List   Diagnosis Date Noted   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 04/03/2023   Difficulty urinating 03/27/2023   Subclinical hypothyroidism 02/10/2023   FTT (failure to thrive) in adult 12/31/2022   Dysphagia, unspecified 12/31/2022   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 12/31/2022   Hepatitis A immune 11/14/2022   End of life care 10/23/2022   Acute purulent bronchitis 10/23/2022   Hepatitis C 10/09/2022   Left leg pain 10/09/2022   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   History of pancytopenia 09/12/2022   Torticollis 07/04/2022   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   Defective dental restoration 12/31/2021   Chronic periodontitis 12/31/2021   Impacted third molar tooth 12/31/2021   Irritant contact dermatitis associated with digestive stoma 12/25/2021   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   Continuous tobacco abuse 11/21/2021   Dermatitis associated with moisture 10/16/2021   Back pain 08/16/2021   Candidal endocarditis    History of radiation to head and neck region 07/11/2021   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   Dysgeusia 07/11/2021   Pressure injury of skin 06/17/2021   Tracheostomy status (HCC)    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   Heart failure with reduced ejection fraction (HCC) 04/28/2021   Anxiety 04/28/2021   Elevated troponin 04/01/2021   Elevated transaminase level 04/01/2021   Weight loss, unintentional 01/15/2021   Protein-calorie malnutrition, severe (HCC) 01/10/2021  Hypotension 01/08/2021   Chemotherapy induced nausea and vomiting 01/01/2021   Encounter for dental examination 12/13/2020   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   Teeth missing 12/04/2020   Radiation caries 12/04/2020   Retained tooth root 12/04/2020   Chronic apical periodontitis 12/04/2020   Accretions on teeth 12/04/2020    PCP: Adron Bene, MD   REFERRING PROVIDER: Glee Arvin, NP   REFERRING DIAG: Diagnosis  Information  Diagnosis  C32.9 (ICD-10-CM) - Laryngeal cancer (HCC)    THERAPY DIAG:  Cervicalgia - Plan: PT plan of care cert/re-cert  Muscle weakness (generalized) - Plan: PT plan of care cert/re-cert  Abnormal posture - Plan: PT plan of care cert/re-cert  Rationale for Evaluation and Treatment: Rehabilitation  ONSET DATE: 1.5 year history (trach placement)  SUBJECTIVE:                                                                                                                                                                                                         SUBJECTIVE STATEMENT: My dad is dying.  I'm going to get my neck straight.    From Eval: Pt presents to PT with Rt sided neck tension and shortened tissue secondary to radiation.  Pt has difficulty holding up his head due to pain, postural weakness, and shortened tissue on the Rt due to radiation.  Pt was discharged from PT in August due to clot in Rt jugular vein.  Clot is now resolved.  He reports that he has been stretching and working on posture at home.   Hand dominance: Right  PERTINENT HISTORY:  Tracheostomy, radiation to neck, malignant neoplasm of supraglottis, thrombosis of Rt internal jugular vein, torticollis, heart failure  PAIN: 04/23/2023 Are you having pain? Yes: NPRS scale: 0/10, just stiff Pain location: Rt neck Pain description: stiff, throbbing Aggravating factors: overstretching, trying to stretch  Relieving factors: massaging, medication  PRECAUTIONS: Other: cancer, trach  WEIGHT BEARING RESTRICTIONS: No  FALLS:  Has patient fallen in last 6 months? No  LIVING ENVIRONMENT: Lives with: lives alone Lives in: House/apartment   PLOF: Independent, walking to mailbox  PATIENT GOALS: reduce neck pain, improve mobility   NEXT MD VISIT:  January 2025  OBJECTIVE:   DIAGNOSTIC FINDINGS:  None   PATIENT SURVEYS:  04/23/23: 6 min walk test: 1217 feet (2339 is age related norm)  NDI  15/50 03/10/23: 6 min walk test: 1135 feet (age related norm in healthy male is 2339 ft)- RPE 5/10 The Patient-Specific Functional Scale (PSFS)  Initial:  I am going to ask you to identify up  to 3 important activities that you are unable to do or are having difficulty with as a result of this problem.  Today are there any activities that you are unable to do or having difficulty with because of this?  (Patient shown scale and patient rated each activity)  Follow up: When you first came in you had difficulty performing these activities.  Today do you still have difficulty?  Patient-Specific activity scoring scheme (Point to one number):  0 1 2 3 4 5 6 7 8 9  10 Unable                                                                                                          Able to perform To perform                                                                                                    activity at the same Activity         Level as before                                                                                                                       Injury or problem  Activity   Taking out trash                                                   Initial:  8/10                     follow up:     COGNITION: Overall cognitive status: Within functional limits for tasks assessed  SENSATION: WFL  POSTURE: rounded shoulders, forward head, increased thoracic kyphosis, and flexed trunk   PALPATION: Significant tension and reduced muscle length in bil neck. Fixed cervical spine segments with minimal mobility allowed.    CERVICAL ROM:   Active ROM A/PROM (deg) 01/13/23  Flexion Rests in 30 degrees flexion  Extension   Right lateral flexion Rests in 30 degrees  Left lateral flexion 10 degrees Rt lateral flexion  Right rotation 50 degrees   Left rotation 25 degrees    (Blank rows = not tested)  UPPER EXTREMITY ROM:  UE A/ROM is WFLs without pain  UPPER EXTREMITY  MMT: 4+/5 bil UE strength, neck strength 4-/5 throughout   TODAY'S TREATMENT:                                                                                                                              DATE:   04/23/23 NuStep: Level 4-arms and legs with cues to keep head upright x 5 minutes- PT present to discuss progress Sit to stand with 5# kettlebell chest press 2x10 Shoulder flexion and scaption: 2# 2x10 bil each Standing rows and shoulder extension with red band: 2x10 bil- good control today Farmer's carry with 5# kettlebell - 1 lap in each hand  Shoulder extension and rows: 2x10 bil with green band 6 min walk test: 1217 feet   04/16/23:  Manual: elongation and movement with mobs to neck.    04/09/23 NuStep: Level 4-arms and legs with cues to keep head upright x 6 minutes- PT present to discuss progress Sit to stand with 5# kettlebell chest press 2x10 Shoulder flexion and scaption: 2# 2x10 bil each Standing rows and shoulder extension with red band: 2x10 bil- good control today Farmer's carry with 5# kettlebell - 1 lap in each hand    PATIENT EDUCATION:  Education details: Access Code: [email or web address] Person educated: Patient Education method: Explanation, Demonstration, and Handouts Education comprehension: verbalized understanding and returned demonstration  HOME EXERCISE PROGRAM: Access Code: [email or web address] URL: [email or web address] Date: 03/05/2023 Prepared by: Tresa Endo  Exercises - Seated Scapular Retraction  - 5 x daily - 7 x weekly - 1 sets - 10 reps - 5 hold - Supine Scapular Retraction  - 3 x daily - 7 x weekly - 1 sets - 10 reps - 5 hold - Seated Correct Posture  - 1 x daily - 7 x weekly - 3 sets - 10 reps - Seated Cervical Retraction  - 1 x daily - 7 x weekly - 3 sets - 10 reps - Supine Chin Tuck  - 1 x daily - 7 x weekly - 3 sets - 10 reps - Seated Cervical Sidebending AROM  - 3 x daily - 7 x weekly - 1 sets - 10 reps - 5-10 hold - Seated Assisted Cervical  Rotation with Towel  - 5 x daily - 7 x weekly - 1 sets - 3 reps - 10 hold - Shoulder External Rotation and Scapular Retraction with Resistance  - 1 x daily - 7 x weekly - 2 sets - 10 reps - Standing Shoulder Row with Anchored Resistance  - 1 x daily - 7 x weekly - 2 sets - 10 reps - Shoulder extension with resistance - Neutral  - 1 x daily - 7 x weekly - 2 sets - 10 reps - Sit to  Stand Without Arm Support  - 2 x daily - 7 x weekly - 2 sets - 5 reps  ASSESSMENT:  CLINICAL IMPRESSION: Pt was able to participate in more exercise today.  He maintained an upright head posture for longer periods of time with activity.  6 min walk test is improved today and pt advanced to green band for home.  Pt will work to remain active at home. Pt with a chronic condition and PT helps to improve quality of life through strength and endurance building.  Patient will benefit from skilled PT to address the below impairments and improve overall function.  OBJECTIVE IMPAIRMENTS: decreased activity tolerance, increased muscle spasms, impaired flexibility, postural dysfunction, and pain.   ACTIVITY LIMITATIONS: sitting, standing, and reach over head  PARTICIPATION LIMITATIONS: meal prep, driving, and community activity  PERSONAL FACTORS: 1-2 comorbidities: cancer on pallative care, chronic postural dysfunction  are also affecting patient's functional outcome.   REHAB POTENTIAL: Good  CLINICAL DECISION MAKING: Stable/uncomplicated  EVALUATION COMPLEXITY: Moderate   GOALS: Goals reviewed with patient? Yes  SHORT TERM GOALS: Target date: 02/10/2023      Be independent in initial  HEP Baseline:  Goal status: MET  2.  Verbalize and demonstrate postural corrections to improve tolerance for neutral posture and reduced neck strain  Baseline:  Goal status: MET   LONG TERM GOALS: Target date: 06/27/23    Be independent in advanced HEP Baseline:  Goal status: In progress   3.  Improve taking out  the trash  to 9/10 on PSFS due to improved strength and endurance  Baseline: 8/10 Goal status: In progress   3.   Perform independent postural correction and hold this x 5  minutes  during activity  Baseline: needs visual feedback and can hold 2-3 min Goal status: in progress    4.   Improve 6 min walk test to 1500 feet to improve community function  Baseline: 1217 feet (04/23/23) Goal status: in progress    5.  Initiate a regular walking program and verbalize safe progression for endurance gains   Baseline: walking as able at home (04/23/23)   Goal status: in progress     PLAN:  PT FREQUENCY: 1x/week  PT DURATION: 6 weeks  PLANNED INTERVENTIONS: Therapeutic exercises, Therapeutic activity, Neuromuscular re-education, Balance training, Gait training, Patient/Family education, Self Care, Joint mobilization, Aquatic Therapy, Dry Needling, Electrical stimulation, Spinal mobilization, Cryotherapy, Moist heat, Taping, Traction, Manual therapy, and Re-evaluation  PLAN FOR NEXT SESSION: Address cervical alignment, mobility, posture and functional mobility. Endurance and functional tasks paired with upright posture   Lorrene Reid, PT 05/05/23 4:30 PM  [facility] [address] [place], [place] [address] Phone # [phone] Fax [phone]

## 2023-04-23 NOTE — Telephone Encounter (Signed)
 Pt called for an "emergency dose" of his lyrica stating he was out. Pt picked lyrica up on 3/16 for a 21 day supply. Pt reported that he "spilled some" hich is why is he out early. Pt educated on safe and correct use of lyrica, pt reports severe pain, ED precautions discussed pt agreeable. No further needs at this time.

## 2023-04-30 ENCOUNTER — Ambulatory Visit: Payer: MEDICAID

## 2023-04-30 ENCOUNTER — Telehealth: Payer: Self-pay

## 2023-04-30 NOTE — Telephone Encounter (Signed)
 Pt called reporting that he needed help answering some questions on his social security application. RN answered all questions, no further needs at this time. Pt knows to call with any questions or concerns.

## 2023-05-02 ENCOUNTER — Other Ambulatory Visit: Payer: Self-pay | Admitting: Nurse Practitioner

## 2023-05-02 DIAGNOSIS — Z515 Encounter for palliative care: Secondary | ICD-10-CM

## 2023-05-02 DIAGNOSIS — F419 Anxiety disorder, unspecified: Secondary | ICD-10-CM

## 2023-05-02 DIAGNOSIS — M62838 Other muscle spasm: Secondary | ICD-10-CM

## 2023-05-02 DIAGNOSIS — C329 Malignant neoplasm of larynx, unspecified: Secondary | ICD-10-CM

## 2023-05-02 DIAGNOSIS — G893 Neoplasm related pain (acute) (chronic): Secondary | ICD-10-CM

## 2023-05-02 MED ORDER — DIAZEPAM 5 MG/5ML PO SOLN
3.0000 mg | Freq: Three times a day (TID) | ORAL | 0 refills | Status: DC | PRN
Start: 2023-05-02 — End: 2023-05-22

## 2023-05-02 MED ORDER — LIDOCAINE VISCOUS HCL 2 % MT SOLN: 15.0000 mL | OROMUCOSAL | 2 refills | Status: DC | PRN

## 2023-05-02 NOTE — Telephone Encounter (Signed)
 Pt called for medication refill, see associated orders

## 2023-05-05 NOTE — Addendum Note (Signed)
 Addended by: Edrick Oh on: 05/05/2023 04:30 PM   Modules accepted: Orders

## 2023-05-07 ENCOUNTER — Ambulatory Visit: Payer: MEDICAID | Attending: Radiation Oncology

## 2023-05-07 ENCOUNTER — Telehealth: Payer: Self-pay

## 2023-05-07 DIAGNOSIS — R293 Abnormal posture: Secondary | ICD-10-CM | POA: Diagnosis present

## 2023-05-07 DIAGNOSIS — M542 Cervicalgia: Secondary | ICD-10-CM | POA: Diagnosis present

## 2023-05-07 DIAGNOSIS — M6281 Muscle weakness (generalized): Secondary | ICD-10-CM | POA: Diagnosis present

## 2023-05-07 NOTE — Therapy (Addendum)
 OUTPATIENT PHYSICAL THERAPY TREATMENT   Patient Name: David [name] MRN: [id number] DOB:09/01/1975, 48 y.o., male Today's Date: 05/12/2023      END OF SESSION:   PT End of Session - 04/23/23 0842       Visit Number 14    Date for PT Re-Evaluation 06/27/23     Authorization Type 6 visits-05/06/2023-5/30/202     Authorization - Visit Number 1    Authorization - Number of Visits 6    PT Start Time 0843    PT Stop Time 0929    PT Time Calculation (min) 46 min     Activity Tolerance Patient tolerated treatment well     Behavior During Therapy [facility] for tasks assessed/performed                         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)    Pneumococcal bacteremia 06/09/2021   Pneumothorax    Left lung spontaneous pneumothorax at age 28 yr    S/P percutaneous endoscopic gastrostomy (PEG) tube placement (HCC) 06/24/2021   Past Surgical History:  Procedure Laterality Date   chest tubes Left    IR GASTROSTOMY TUBE MOD SED  06/18/2021   LAPAROSCOPIC INSERTION GASTROSTOMY TUBE N/A 06/20/2021   Procedure: LAPAROSCOPIC INSERTION GASTROSTOMY TUBE;  Surgeon: Adalberto Acton, MD;  Location: [facility] OR;  Service: General;  Laterality: N/A;  DOW PATIENT   TRACHEOSTOMY TUBE PLACEMENT N/A 06/09/2021   Procedure: AWAKE TRACHEOSTOMY;  Surgeon: Virgina Grills, MD;  Location: [facility] OR;  Service: ENT;  Laterality: N/A;   Patient Active Problem List   Diagnosis Date Noted   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 04/03/2023   Difficulty urinating 03/27/2023   Subclinical hypothyroidism 02/10/2023   FTT (failure to thrive) in adult 12/31/2022   Dysphagia, unspecified 12/31/2022   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 12/31/2022   Hepatitis A immune 11/14/2022   End of life care 10/23/2022   Acute purulent bronchitis 10/23/2022   Hepatitis C 10/09/2022   Left leg pain 10/09/2022   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   History of pancytopenia 09/12/2022   Torticollis 07/04/2022   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   Defective dental restoration 12/31/2021   Chronic periodontitis 12/31/2021   Impacted third molar tooth 12/31/2021   Irritant contact dermatitis associated with digestive stoma 12/25/2021   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   Continuous tobacco abuse 11/21/2021   Dermatitis associated with moisture 10/16/2021   Back pain 08/16/2021   Candidal endocarditis    History of radiation to head and neck region 07/11/2021   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   Dysgeusia 07/11/2021   Pressure injury of skin 06/17/2021   Tracheostomy status (HCC)    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   Heart failure with reduced ejection fraction (HCC) 04/28/2021   Anxiety 04/28/2021   Elevated troponin 04/01/2021   Elevated transaminase level 04/01/2021   Weight loss, unintentional 01/15/2021  Protein-calorie malnutrition, severe (HCC) 01/10/2021   Hypotension 01/08/2021   Chemotherapy induced nausea and vomiting 01/01/2021   Encounter for dental examination 12/13/2020   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   Teeth missing 12/04/2020   Radiation caries 12/04/2020   Retained tooth root 12/04/2020   Chronic apical periodontitis 12/04/2020   Accretions on teeth 12/04/2020    PCP: Gawaluck, [place], MD   REFERRING PROVIDER: Jeb Miner, NP   REFERRING DIAG: Diagnosis  Information  Diagnosis  C32.9 (ICD-10-CM) - Laryngeal cancer (HCC)    THERAPY DIAG:  Cervicalgia  Muscle weakness (generalized)  Abnormal posture  Rationale for Evaluation and Treatment: Rehabilitation  ONSET DATE: 1.5 year history (trach placement)  SUBJECTIVE:                                                                                                                                                                                                         SUBJECTIVE STATEMENT: Transportation got me here late.  I'm tired today. I've been walking to the stop sign and back 2-3 times a day.   From Eval: Pt presents to PT with Rt sided neck tension and shortened tissue secondary to radiation.  Pt has difficulty holding up his head due to pain, postural weakness, and shortened tissue on the Rt due to radiation.  Pt was discharged from PT in August due to clot in Rt jugular vein.  Clot is now resolved.  He reports that he has been stretching and working on posture at home.   Hand dominance: Right  PERTINENT HISTORY:  Tracheostomy, radiation to neck, malignant neoplasm of supraglottis, thrombosis of Rt internal jugular vein, torticollis, heart failure  PAIN: 04/23/2023 Are you having pain? Yes: NPRS scale: 0/10, just stiff Pain location: Rt neck Pain description: stiff, throbbing Aggravating factors: overstretching, trying to stretch  Relieving factors: massaging, medication  PRECAUTIONS: Other: cancer, trach  WEIGHT BEARING RESTRICTIONS: No  FALLS:  Has patient fallen in last 6 months? No  LIVING ENVIRONMENT: Lives with: lives alone Lives in: House/apartment   PLOF: Independent, walking to mailbox  PATIENT GOALS: reduce neck pain, improve mobility   NEXT MD VISIT:  January 2025  OBJECTIVE:   DIAGNOSTIC FINDINGS:  None   PATIENT SURVEYS:  04/23/23: 6 min walk test: 1217 feet (2339 is age related norm)  NDI 15/50 03/10/23: 6 min walk test: 1135 feet (age related  norm in healthy male is 2339 ft)- RPE 5/10 The Patient-Specific Functional Scale (PSFS)  Initial:  I am going to ask you to identify up to 3 important activities that  you are unable to do or are having difficulty with as a result of this problem.  Today are there any activities that you are unable to do or having difficulty with because of this?  (Patient shown scale and patient rated each activity)  Follow up: When you first came in you had difficulty performing these activities.  Today do you still have difficulty?  Patient-Specific activity scoring scheme (Point to one number):  0 1 2 3 4 5 6 7 8 9  10 Unable                                                                                                          Able to perform To perform                                                                                                    activity at the same Activity         Level as before                                                                                                                       Injury or problem  Activity   Taking out trash                                                   Initial:  8/10                     follow up:     COGNITION: Overall cognitive status: Within functional limits for tasks assessed  SENSATION: WFL  POSTURE: rounded shoulders, forward head, increased thoracic kyphosis, and flexed trunk   PALPATION: Significant tension and reduced muscle length in bil neck. Fixed cervical spine segments with minimal mobility allowed.    CERVICAL ROM:   Active ROM A/PROM (deg) 01/13/23  Flexion Rests in 30 degrees flexion  Extension   Right lateral flexion Rests in 30 degrees  Left lateral flexion 10 degrees  Rt lateral flexion  Right rotation 50 degrees   Left rotation 25 degrees    (Blank rows = not tested)  UPPER EXTREMITY ROM:  UE A/ROM is WFLs without pain  UPPER EXTREMITY MMT: 4+/5 bil UE strength, neck strength 4-/5  throughout   TODAY'S TREATMENT:                                                                                                                              DATE:   05/07/23 NuStep: Level 4-arms and legs with cues to keep head upright x 8 minutes- PT present to discuss progress 6" step-ups x15 Rt and Lt Self snags with towel x5 Sit to stand with 5# kettlebell chest press 2x10 Shoulder flexion and scaption: 3# 2x10 bil each Standing rows and shoulder extension with green band: 2x10 bil- good control today Farmer's carry with 5# kettlebell - 1 lap in each hand  Manual: IASTM to Lt and Rt neck in sitting  04/23/23 NuStep: Level 4-arms and legs with cues to keep head upright x 5 minutes- PT present to discuss progress Sit to stand with 5# kettlebell chest press 2x10 Shoulder flexion and scaption: 2# 2x10 bil each Standing rows and shoulder extension with red band: 2x10 bil- good control today Farmer's carry with 5# kettlebell - 1 lap in each hand  Shoulder extension and rows: 2x10 bil with green band 6 min walk test: 1217 feet   04/16/23:  Manual: elongation and movement with mobs to neck.    PATIENT EDUCATION:  Education details: Access Code: [email or web address] Person educated: Patient Education method: Explanation, Demonstration, and Handouts Education comprehension: verbalized understanding and returned demonstration  HOME EXERCISE PROGRAM: Access Code: [email or web address] URL: [email or web address] Date: 03/05/2023 Prepared by: Loetta Ringer  Exercises - Seated Scapular Retraction  - 5 x daily - 7 x weekly - 1 sets - 10 reps - 5 hold - Supine Scapular Retraction  - 3 x daily - 7 x weekly - 1 sets - 10 reps - 5 hold - Seated Correct Posture  - 1 x daily - 7 x weekly - 3 sets - 10 reps - Seated Cervical Retraction  - 1 x daily - 7 x weekly - 3 sets - 10 reps - Supine Chin Tuck  - 1 x daily - 7 x weekly - 3 sets - 10 reps - Seated Cervical Sidebending AROM  - 3 x daily - 7 x weekly - 1 sets -  10 reps - 5-10 hold - Seated Assisted Cervical Rotation with Towel  - 5 x daily - 7 x weekly - 1 sets - 3 reps - 10 hold - Shoulder External Rotation and Scapular Retraction with Resistance  - 1 x daily - 7 x weekly - 2 sets - 10 reps - Standing Shoulder Row with Anchored Resistance  - 1 x daily - 7 x weekly - 2 sets - 10 reps - Shoulder extension with resistance - Neutral  - 1 x  daily - 7 x weekly - 2 sets - 10 reps - Sit to Stand Without Arm Support  - 2 x daily - 7 x weekly - 2 sets - 5 reps  ASSESSMENT:  CLINICAL IMPRESSION: Pt continues to work on strength, Advice worker and endurance.  Pt was able to advance exercises at home.  Pt will work to remain active at home. Pt with a chronic condition and PT helps to improve quality of life through strength and endurance building.  Rt side of the neck with improved mobility overall.  He responds well to mobilization with blades in the clinic. Patient will benefit from skilled PT to address the below impairments and improve overall function.  OBJECTIVE IMPAIRMENTS: decreased activity tolerance, increased muscle spasms, impaired flexibility, postural dysfunction, and pain.   ACTIVITY LIMITATIONS: sitting, standing, and reach over head  PARTICIPATION LIMITATIONS: meal prep, driving, and community activity  PERSONAL FACTORS: 1-2 comorbidities: cancer on pallative care, chronic postural dysfunction  are also affecting patient's functional outcome.   REHAB POTENTIAL: Good  CLINICAL DECISION MAKING: Stable/uncomplicated  EVALUATION COMPLEXITY: Moderate   GOALS: Goals reviewed with patient? Yes  SHORT TERM GOALS: Target date: 02/10/2023      Be independent in initial  HEP Baseline:  Goal status: MET  2.  Verbalize and demonstrate postural corrections to improve tolerance for neutral posture and reduced neck strain  Baseline:  Goal status: MET   LONG TERM GOALS: Target date: 06/27/23    Be independent in advanced HEP Baseline:   Goal status: In progress   3.  Improve taking out  the trash to 9/10 on PSFS due to improved strength and endurance  Baseline: 8/10 Goal status: In progress   3.   Perform independent postural correction and hold this x 5  minutes  during activity  Baseline: needs visual feedback and can hold 2-3 min Goal status: in progress    4.   Improve 6 min walk test to 1500 feet to improve community function  Baseline: 1217 feet (04/23/23) Goal status: in progress    5.  Initiate a regular walking program and verbalize safe progression for endurance gains   Baseline: walking as able at home (04/23/23)   Goal status: in progress     PLAN:  PT FREQUENCY: 1x/week  PT DURATION: 6 weeks  PLANNED INTERVENTIONS: Therapeutic exercises, Therapeutic activity, Neuromuscular re-education, Balance training, Gait training, Patient/Family education, Self Care, Joint mobilization, Aquatic Therapy, Dry Needling, Electrical stimulation, Spinal mobilization, Cryotherapy, Moist heat, Taping, Traction, Manual therapy, and Re-evaluation  PLAN FOR NEXT SESSION: Address cervical alignment, mobility, posture and functional mobility. Endurance and functional tasks paired with upright posture   Luella Sager, PT 05/12/23 4:26 PM  [facility] [address] [place], [place] [address] Phone # [phone] Fax [phone]

## 2023-05-07 NOTE — Telephone Encounter (Signed)
 Called pt due to no-show appt.  His phone number is no longer in service.

## 2023-05-15 ENCOUNTER — Telehealth: Payer: Self-pay

## 2023-05-15 NOTE — Telephone Encounter (Signed)
 Pt mother, Lorra Rosella, called on pt behalf asking about a referral for Botox injections to the patient's neck for muscle spacticity and dystonia. RN confirmed that that referral would need to come from the PT office since they are the ones recommending and referring the pt. Pt mother verbalized understanding, no further needs at this time.

## 2023-05-16 ENCOUNTER — Other Ambulatory Visit: Payer: Self-pay

## 2023-05-16 DIAGNOSIS — C321 Malignant neoplasm of supraglottis: Secondary | ICD-10-CM

## 2023-05-16 DIAGNOSIS — Z923 Personal history of irradiation: Secondary | ICD-10-CM

## 2023-05-19 ENCOUNTER — Ambulatory Visit: Payer: MEDICAID

## 2023-05-20 NOTE — Progress Notes (Signed)
 Oncology Nurse Navigator Documentation   David [name] and his mother requested a referral to neurology to discuss botox injections due to muscle spacticity and dystonia to his neck. Referral placed but communication received from Dr. Phebe Brasil, Neurologist indicates that he is not a candidate due to his history of laryngeal cancer along with chemotherapy and radiation. I have called and informed both David [name] and [name]. They both voiced their understanding. They have my contact number if I can help with anymore concerns or questions.   Lynetta Saran RN, BSN, OCN Head & Neck Oncology Nurse Navigator [facility] Phone # [phone]  Fax # [phone]

## 2023-05-22 ENCOUNTER — Other Ambulatory Visit: Payer: Self-pay

## 2023-05-22 DIAGNOSIS — Z515 Encounter for palliative care: Secondary | ICD-10-CM

## 2023-05-22 DIAGNOSIS — M62838 Other muscle spasm: Secondary | ICD-10-CM

## 2023-05-22 DIAGNOSIS — C329 Malignant neoplasm of larynx, unspecified: Secondary | ICD-10-CM

## 2023-05-22 DIAGNOSIS — F419 Anxiety disorder, unspecified: Secondary | ICD-10-CM

## 2023-05-22 MED ORDER — DIAZEPAM 5 MG/5ML PO SOLN: 3.0000 mg | Freq: Three times a day (TID) | ORAL | 0 refills | Status: DC | PRN

## 2023-05-22 NOTE — Telephone Encounter (Signed)
 Pt called for medication refill, see associated orders

## 2023-05-23 ENCOUNTER — Ambulatory Visit (HOSPITAL_COMMUNITY)
Admission: RE | Admit: 2023-05-23 | Discharge: 2023-05-23 | Disposition: A | Discharge status: Home / Self Care | Payer: MEDICAID | Source: Ambulatory Visit | Attending: Acute Care | Admitting: Acute Care

## 2023-05-23 DIAGNOSIS — M436 Torticollis: Secondary | ICD-10-CM

## 2023-05-23 DIAGNOSIS — C329 Malignant neoplasm of larynx, unspecified: Secondary | ICD-10-CM | POA: Diagnosis present

## 2023-05-23 DIAGNOSIS — Z930 Tracheostomy status: Secondary | ICD-10-CM | POA: Diagnosis present

## 2023-05-23 DIAGNOSIS — J386 Stenosis of larynx: Secondary | ICD-10-CM | POA: Diagnosis not present

## 2023-05-23 NOTE — Progress Notes (Signed)
 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 Has had slow but steady overall decline over the summer. Specifically significant adult FTT w/ECOG 3. He remains hospice appropriate in my opinion. Still very debilitated, malnourished, his neck contraction has not improved. I see he had wanted referral for botox in effort to help w/ muscle spacticity but deemed not a candidate d/t prior XRT. He continues to follow w/ outpt rehab   Review of Systems  All other systems reviewed and are negative.   Exam  General presented in no distress HENT Kimberly head/neck contracture continues w/ right ear almost touching right shoulder. He is able to move his head more midline and hold it in place more than he had about a month ago. He attributes this to PT and using his positional neck pillow Trach is mid line no changes Pulm clear Card rrr Abd soft Ext warm dry  Neuro intact   Procedure: the # 7 portex was removed. Modifications were made to the new trach to make it identical to one we removed. Then using clean technique the new # 7 portex was placed over obturator. Placement verified via ETCO2. Pt tolerated well. Again reminded that the current model is being used outside designed intent    Active Problems:   Tracheostomy status (HCC)   Subglottic stenosis   Laryngeal cancer (HCC)   Torticollis    Tracheostomy status (HCC) Overview: Brand: shiley Size: 7 Portex Bivona cuffless  Last change in clinic: today 3/6  prior was 2/3   Discussion Still has sig air trapping w/ capping attempt. He is not a candidate for decannulation.   He was switched  to 7 portex with # 4 inner shiley due to his marked neck contracture developing pressure ulcer on the left of stoma. He at home had modified the trach flange on the right sided so that it didn't rub on his neck, and modified the lumen length so that he can use an internal shiley #  4 disposable which he has in surplus so that he can clean the inner cannula and not have to have trach removed every time he plugs which is a chronic problem for him. We discussed at length that modification of his trach would void any warrantee and does increase the risk of failure (I worry mostly about the integrity of the inner cannula) but he has tolerated this modification for over a month, is doing better with it, and he will continue to make this modification at home regardless if we modify it in the clinic. He understands this risk and accepts it.     Plan ROV 4 weeks Cont PMV as tolerated HME as able Continue w/ his modified trach. Not really good alternatives given his neck contracture. I also think given he really is palliative in my opinion we should do all we can do to improve his QOL    Torticollis Overview: His head/neck contracture is better. He can actually move it a little easier. Still the left trapezius is very tight. I think given his complicated pain rx hx additional muscle relaxants would be challenging. Not sure if there is surgical release is contraindicated in pt w/ prior head and neck surg Plan Cont PT for now I will defer any decision to pain management to his palliative or pain providers.       My time 16 min    David [name] E  [facility] ACNP-BC [facility] Pulmonary/Critical Care Pager # [phone] OR # [phone] if no answer

## 2023-05-23 NOTE — Progress Notes (Signed)
 Tracheostomy Procedure Note  David [name] [id number] 10/04/1975  Pre Procedure Tracheostomy Information  Trach Brand: Portex Bivona Size:  7.0   60A170 Style: Uncuffed Secured by: Velcro   Procedure: Trach change and Trach Cleaning    Post Procedure Tracheostomy Information  Trach Brand: Portex  Bivona Size:  7.0  60A170 Style: Uncuffed Secured by: Velcro   Post Procedure Evaluation:  ETCO2 positive color change from yellow to purple : Yes.   Vital signs:VSS Patients current condition: stable Complications: No apparent complications Trach site exam: clean, dry Wound care done: 4 x 4 gauze  drain Patient did tolerate procedure well.   Education: none  Prescription needs: none    Additional needs: New PMV given to patient today along with 1 box # 4 inner cannulas

## 2023-05-26 ENCOUNTER — Telehealth: Payer: Self-pay

## 2023-05-26 ENCOUNTER — Other Ambulatory Visit: Payer: Self-pay

## 2023-05-26 DIAGNOSIS — C329 Malignant neoplasm of larynx, unspecified: Secondary | ICD-10-CM

## 2023-05-26 DIAGNOSIS — Z515 Encounter for palliative care: Secondary | ICD-10-CM

## 2023-05-26 DIAGNOSIS — M62838 Other muscle spasm: Secondary | ICD-10-CM

## 2023-05-26 DIAGNOSIS — F419 Anxiety disorder, unspecified: Secondary | ICD-10-CM

## 2023-05-26 MED ORDER — DIAZEPAM 5 MG/5ML PO SOLN: 3.0000 mg | Freq: Three times a day (TID) | ORAL | 0 refills | Status: DC | PRN

## 2023-05-26 NOTE — Telephone Encounter (Signed)
 Pt pharmacy called reporting they were out of diazepam  but another [facility] in the area had some in stock. Medication orders transferred. RN called David [name], pt mother, to inform of medication transfer. Verbalized understanding, no further needs at this time.

## 2023-05-27 ENCOUNTER — Encounter: Payer: Self-pay | Admitting: Nurse Practitioner

## 2023-05-27 ENCOUNTER — Inpatient Hospital Stay: Payer: MEDICAID | Attending: Hematology and Oncology | Admitting: Nurse Practitioner

## 2023-05-27 VITALS — BP 124/88 | HR 89 | Temp 99.3°F | Resp 19 | Wt 105.4 lb

## 2023-05-27 DIAGNOSIS — F419 Anxiety disorder, unspecified: Secondary | ICD-10-CM | POA: Diagnosis not present

## 2023-05-27 DIAGNOSIS — C321 Malignant neoplasm of supraglottis: Secondary | ICD-10-CM

## 2023-05-27 DIAGNOSIS — Z515 Encounter for palliative care: Secondary | ICD-10-CM | POA: Diagnosis not present
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
## 2023-06-04 ENCOUNTER — Ambulatory Visit: Payer: MEDICAID | Attending: Radiation Oncology

## 2023-06-04 DIAGNOSIS — M542 Cervicalgia: Secondary | ICD-10-CM | POA: Diagnosis present

## 2023-06-04 DIAGNOSIS — R293 Abnormal posture: Secondary | ICD-10-CM | POA: Insufficient documentation

## 2023-06-04 DIAGNOSIS — M6281 Muscle weakness (generalized): Secondary | ICD-10-CM | POA: Insufficient documentation

## 2023-06-04 NOTE — Therapy (Signed)
 OUTPATIENT PHYSICAL THERAPY TREATMENT   Patient Name: David [name] MRN: [id number] DOB:1976-01-15, 48 y.o., male Today's Date: 06/04/2023     PT End of Session - 06/04/23 0838     Visit Number 15    Date for PT Re-Evaluation 06/27/23    Authorization Type 6 visits-05/06/2023-5/30/202    Authorization - Visit Number 2    Authorization - Number of Visits 6    PT Start Time 0755    PT Stop Time 0838    PT Time Calculation (min) 43 min    Activity Tolerance Patient limited by pain    Behavior During Therapy [facility] for tasks assessed/performed             PT End of Session - 06/04/23 0838     Visit Number 15    Date for PT Re-Evaluation 06/27/23    Authorization Type 6 visits-05/06/2023-5/30/202    Authorization - Visit Number 2    Authorization - Number of Visits 6    PT Start Time 0755    PT Stop Time 0838    PT Time Calculation (min) 43 min    Activity Tolerance Patient limited by pain    Behavior During Therapy [facility] for tasks assessed/performed             END OF SESSION:   PT End of Session - 04/23/23 0842       Visit Number 14    Date for PT Re-Evaluation 06/27/23     Authorization Type 6 visits-05/06/2023-5/30/202     Authorization - Visit Number 1    Authorization - Number of Visits 6    PT Start Time 0843    PT Stop Time 0929    PT Time Calculation (min) 46 min     Activity Tolerance Patient tolerated treatment well     Behavior During Therapy WFL for tasks assessed/performed                         Past Medical History:  Diagnosis Date   Acute respiratory failure with hypoxia (HCC)    AKI (acute kidney injury) (HCC) 01/08/2021   Aspiration pneumonia of right lower lobe (HCC) 06/06/2021   Bacterial endocarditis    Chronic HFrEF (heart failure with reduced ejection fraction) (HCC) 06/09/2021   Community acquired pneumonia due to Pneumococcus (HCC) 04/01/2021   Evaluation by medical service required 03/19/2022   Heroin addiction  (HCC)    Leaking PEG tube (HCC) 12/12/2021   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)    Pneumococcal bacteremia 06/09/2021   Pneumothorax    Left lung spontaneous pneumothorax at age 74 yr    S/P percutaneous endoscopic gastrostomy (PEG) tube placement (HCC) 06/24/2021   Past Surgical History:  Procedure Laterality Date   chest tubes Left    IR GASTROSTOMY TUBE MOD SED  06/18/2021   LAPAROSCOPIC INSERTION GASTROSTOMY TUBE N/A 06/20/2021   Procedure: LAPAROSCOPIC INSERTION GASTROSTOMY TUBE;  Surgeon: Adalberto Acton, MD;  Location: [facility] OR;  Service: General;  Laterality: N/A;  DOW PATIENT   TRACHEOSTOMY TUBE PLACEMENT N/A 06/09/2021   Procedure: AWAKE TRACHEOSTOMY;  Surgeon: Virgina Grills, MD;  Location: [facility] OR;  Service: ENT;  Laterality: N/A;   Patient Active Problem List   Diagnosis Date Noted   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 04/03/2023   Difficulty urinating 03/27/2023   Subclinical hypothyroidism 02/10/2023   FTT (failure to thrive) in adult 12/31/2022   Dysphagia, unspecified 12/31/2022  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 12/31/2022   Hepatitis A immune 11/14/2022   End of life care 10/23/2022   Acute purulent bronchitis 10/23/2022   Hepatitis C 10/09/2022   Left leg pain 10/09/2022   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   History of pancytopenia 09/12/2022   Torticollis 07/04/2022   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   Defective dental restoration 12/31/2021   Chronic periodontitis 12/31/2021   Impacted third molar tooth 12/31/2021   Irritant contact dermatitis associated with digestive stoma 12/25/2021   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   Continuous tobacco abuse 11/21/2021   Dermatitis associated with moisture 10/16/2021   Back pain 08/16/2021   Candidal endocarditis    History of radiation to head and neck region 07/11/2021   Xerostomia due to radiotherapy  07/11/2021   Dysgeusia 07/11/2021   Pressure injury of skin 06/17/2021   Tracheostomy status (HCC)    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   Heart failure with reduced ejection fraction (HCC) 04/28/2021   Anxiety 04/28/2021   Elevated troponin 04/01/2021   Elevated transaminase level 04/01/2021   Weight loss, unintentional 01/15/2021   Protein-calorie malnutrition, severe (HCC) 01/10/2021   Hypotension 01/08/2021   Chemotherapy induced nausea and vomiting 01/01/2021   Encounter for dental examination 12/13/2020   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   Teeth missing 12/04/2020   Radiation caries 12/04/2020   Retained tooth root 12/04/2020   Chronic apical periodontitis 12/04/2020   Accretions on teeth 12/04/2020    PCP: Gawaluck, [place], MD   REFERRING PROVIDER: Jeb Miner, NP   REFERRING DIAG: Diagnosis Information  Diagnosis  C32.9 (ICD-10-CM) - Laryngeal cancer (HCC)    THERAPY DIAG:  Cervicalgia  Muscle weakness (generalized)  Abnormal posture  Rationale for Evaluation and Treatment: Rehabilitation  ONSET DATE: 1.5 year history (trach placement)  SUBJECTIVE:                                                                                                                                                                                                         SUBJECTIVE STATEMENT: I'm back at my apartment.  I have been exercising.  My transportation has been problematic so I've missed my PT sessions.    From Eval: Pt presents to PT with Rt sided neck tension and shortened tissue secondary to radiation.  Pt has difficulty holding up his head due to pain, postural weakness, and shortened tissue on the Rt due to radiation.  Pt was discharged from PT in August due to clot in Rt jugular  vein.  Clot is now resolved.  He reports that he has been stretching and working on posture at home.   Hand dominance: Right  PERTINENT HISTORY:  Tracheostomy, radiation to neck,  malignant neoplasm of supraglottis, thrombosis of Rt internal jugular vein, torticollis, heart failure  PAIN: 06/04/2023 Are you having pain? Yes: NPRS scale: 0/10, just stiff Pain location: Rt neck Pain description: stiff, throbbing Aggravating factors: overstretching, trying to stretch  Relieving factors: massaging, medication  PRECAUTIONS: Other: cancer, trach  WEIGHT BEARING RESTRICTIONS: No  FALLS:  Has patient fallen in last 6 months? No  LIVING ENVIRONMENT: Lives with: lives alone Lives in: House/apartment   PLOF: Independent, walking to mailbox  PATIENT GOALS: reduce neck pain, improve mobility   NEXT MD VISIT:  January 2025  OBJECTIVE:   DIAGNOSTIC FINDINGS:  None   PATIENT SURVEYS:  04/23/23: 6 min walk test: 1217 feet (2339 is age related norm)  NDI 15/50 03/10/23: 6 min walk test: 1135 feet (age related norm in healthy male is 2339 ft)- RPE 5/10 The Patient-Specific Functional Scale (PSFS)  Initial:  I am going to ask you to identify up to 3 important activities that you are unable to do or are having difficulty with as a result of this problem.  Today are there any activities that you are unable to do or having difficulty with because of this?  (Patient shown scale and patient rated each activity)  Follow up: When you first came in you had difficulty performing these activities.  Today do you still have difficulty?  Patient-Specific activity scoring scheme (Point to one number):  0 1 2 3 4 5 6 7 8 9  10 Unable                                                                                                          Able to perform To perform                                                                                                    activity at the same Activity         Level as before  Injury or problem  Activity   Taking out trash                                                    Initial:  8/10                     follow up:     COGNITION: Overall cognitive status: Within functional limits for tasks assessed  SENSATION: WFL  POSTURE: rounded shoulders, forward head, increased thoracic kyphosis, and flexed trunk   PALPATION: Significant tension and reduced muscle length in bil neck. Fixed cervical spine segments with minimal mobility allowed.    CERVICAL ROM:   Active ROM A/PROM (deg) 01/13/23  Flexion Rests in 30 degrees flexion  Extension   Right lateral flexion Rests in 30 degrees  Left lateral flexion 10 degrees Rt lateral flexion  Right rotation 50 degrees   Left rotation 25 degrees    (Blank rows = not tested)  UPPER EXTREMITY ROM:  UE A/ROM is WFLs without pain  UPPER EXTREMITY MMT: 4+/5 bil UE strength, neck strength 4-/5 throughout   TODAY'S TREATMENT:                                                                                                                              DATE:   06/04/23 NuStep: Level 4-arms and legs with cues to keep head upright x 8 minutes- PT present to discuss progress 6" step-ups x15 Rt and Lt Self snags with towel x5 Sit to stand with 5# kettlebell chest press 2x10 Shoulder flexion and scaption: 3# 2x10 bil each Seated row:   Farmer's carry with 5# kettlebell - 1 lap in each hand  Manual: IASTM to Lt and Rt neck in sitting  05/07/23 NuStep: Level 4-arms and legs with cues to keep head upright x 8 minutes- PT present to discuss progress 6" step-ups x15 Rt and Lt Self snags with towel x5 Sit to stand with 5# kettlebell chest press 2x10 Shoulder flexion and scaption: 3# 2x10 bil each Standing rows and shoulder extension with green band: 2x10 bil- good control today Farmer's carry with 5# kettlebell - 1 lap in each hand  Manual: IASTM to Lt and Rt neck in sitting  04/23/23 NuStep: Level 4-arms and legs with cues to keep head upright x 5 minutes- PT present to  discuss progress Sit to stand with 5# kettlebell chest press 2x10 Shoulder flexion and scaption: 2# 2x10 bil each Standing rows and shoulder extension with red band: 2x10 bil- good control today Farmer's carry with 5# kettlebell - 1 lap in each hand  Shoulder extension and rows: 2x10 bil with green band 6 min walk test: 1217 feet    PATIENT EDUCATION:  Education details: Access Code: [email or web address] Person educated: Patient Education method: Explanation,  Demonstration, and Handouts Education comprehension: verbalized understanding and returned demonstration  HOME EXERCISE PROGRAM: Access Code: [email or web address] URL: [email or web address] Date: 03/05/2023 Prepared by: Loetta Ringer  Exercises - Seated Scapular Retraction  - 5 x daily - 7 x weekly - 1 sets - 10 reps - 5 hold - Supine Scapular Retraction  - 3 x daily - 7 x weekly - 1 sets - 10 reps - 5 hold - Seated Correct Posture  - 1 x daily - 7 x weekly - 3 sets - 10 reps - Seated Cervical Retraction  - 1 x daily - 7 x weekly - 3 sets - 10 reps - Supine Chin Tuck  - 1 x daily - 7 x weekly - 3 sets - 10 reps - Seated Cervical Sidebending AROM  - 3 x daily - 7 x weekly - 1 sets - 10 reps - 5-10 hold - Seated Assisted Cervical Rotation with Towel  - 5 x daily - 7 x weekly - 1 sets - 3 reps - 10 hold - Shoulder External Rotation and Scapular Retraction with Resistance  - 1 x daily - 7 x weekly - 2 sets - 10 reps - Standing Shoulder Row with Anchored Resistance  - 1 x daily - 7 x weekly - 2 sets - 10 reps - Shoulder extension with resistance - Neutral  - 1 x daily - 7 x weekly - 2 sets - 10 reps - Sit to Stand Without Arm Support  - 2 x daily - 7 x weekly - 2 sets - 5 reps  ASSESSMENT:  CLINICAL IMPRESSION: Pt has had a lapse in treatment due to transportation issues. Pt with a chronic condition and PT helps to improve quality of life through strength and endurance building.  Rt side of the neck remains tight due to fixed Rt lateral flexion.   He does report that he can tip his head back to take a drink now and he was not able to do that before.  PT monitored throughout session for safety and to advance as appropriate. Patient will benefit from skilled PT to address the below impairments and improve overall function.  OBJECTIVE IMPAIRMENTS: decreased activity tolerance, increased muscle spasms, impaired flexibility, postural dysfunction, and pain.   ACTIVITY LIMITATIONS: sitting, standing, and reach over head  PARTICIPATION LIMITATIONS: meal prep, driving, and community activity  PERSONAL FACTORS: 1-2 comorbidities: cancer on pallative care, chronic postural dysfunction  are also affecting patient's functional outcome.   REHAB POTENTIAL: Good  CLINICAL DECISION MAKING: Stable/uncomplicated  EVALUATION COMPLEXITY: Moderate   GOALS: Goals reviewed with patient? Yes  SHORT TERM GOALS: Target date: 02/10/2023      Be independent in initial  HEP Baseline:  Goal status: MET  2.  Verbalize and demonstrate postural corrections to improve tolerance for neutral posture and reduced neck strain  Baseline:  Goal status: MET   LONG TERM GOALS: Target date: 06/27/23    Be independent in advanced HEP Baseline:  Goal status: In progress   3.  Improve taking out  the trash to 9/10 on PSFS due to improved strength and endurance  Baseline: 8/10 Goal status: In progress   3.   Perform independent postural correction and hold this x 5  minutes  during activity  Baseline: needs visual feedback and can hold 2-3 min Goal status: in progress    4.   Improve 6 min walk test to 1500 feet to improve community function  Baseline: 1217 feet (04/23/23) Goal status: in progress  5.  Initiate a regular walking program and verbalize safe progression for endurance gains   Baseline: walking as able at home (04/23/23)   Goal status: in progress     PLAN:  PT FREQUENCY: 1x/week  PT DURATION: 6 weeks  PLANNED INTERVENTIONS:  Therapeutic exercises, Therapeutic activity, Neuromuscular re-education, Balance training, Gait training, Patient/Family education, Self Care, Joint mobilization, Aquatic Therapy, Dry Needling, Electrical stimulation, Spinal mobilization, Cryotherapy, Moist heat, Taping, Traction, Manual therapy, and Re-evaluation  PLAN FOR NEXT SESSION: Address cervical alignment, mobility, posture and functional mobility. Endurance and functional tasks paired with upright posture   Luella Sager, PT 06/04/23 8:40 AM  [facility] [address] [place], [place] [address] Phone # [phone] Fax [phone]

## 2023-06-11 ENCOUNTER — Ambulatory Visit: Payer: MEDICAID

## 2023-06-11 DIAGNOSIS — R293 Abnormal posture: Secondary | ICD-10-CM

## 2023-06-11 DIAGNOSIS — M6281 Muscle weakness (generalized): Secondary | ICD-10-CM

## 2023-06-11 DIAGNOSIS — M542 Cervicalgia: Secondary | ICD-10-CM | POA: Diagnosis not present

## 2023-06-11 NOTE — Therapy (Signed)
 OUTPATIENT PHYSICAL THERAPY TREATMENT   Patient Name: David [name] MRN: [id number] DOB:Sep 05, 1975, 48 y.o., male Today's Date: 06/11/2023     PT End of Session - 06/11/23 0845     Visit Number 16    Date for PT Re-Evaluation 06/27/23    Authorization Type 6 visits-05/06/2023-5/30/202    Authorization - Visit Number 3    Authorization - Number of Visits 6    PT Start Time 0759    PT Stop Time 0845    PT Time Calculation (min) 46 min    Activity Tolerance Patient tolerated treatment well    Behavior During Therapy [facility] for tasks assessed/performed              PT End of Session - 06/11/23 0845     Visit Number 16    Date for PT Re-Evaluation 06/27/23    Authorization Type 6 visits-05/06/2023-5/30/202    Authorization - Visit Number 3    Authorization - Number of Visits 6    PT Start Time 0759    PT Stop Time 0845    PT Time Calculation (min) 46 min    Activity Tolerance Patient tolerated treatment well    Behavior During Therapy WFL for tasks assessed/performed                             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)    Pneumococcal bacteremia 06/09/2021   Pneumothorax    Left lung spontaneous pneumothorax at age 48 yr    S/P percutaneous endoscopic gastrostomy (PEG) tube placement (HCC) 06/24/2021   Past Surgical History:  Procedure Laterality Date   chest tubes Left    IR GASTROSTOMY TUBE MOD SED  06/18/2021   LAPAROSCOPIC INSERTION  GASTROSTOMY TUBE N/A 06/20/2021   Procedure: LAPAROSCOPIC INSERTION GASTROSTOMY TUBE;  Surgeon: Adalberto Acton, MD;  Location: [facility] OR;  Service: General;  Laterality: N/A;  DOW PATIENT   TRACHEOSTOMY TUBE PLACEMENT N/A 06/09/2021   Procedure: AWAKE TRACHEOSTOMY;  Surgeon: Virgina Grills, MD;  Location: [facility] OR;  Service: ENT;  Laterality: N/A;   Patient Active Problem List   Diagnosis Date Noted   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 04/03/2023   Difficulty urinating 03/27/2023   Subclinical hypothyroidism 02/10/2023   FTT (failure to thrive) in adult 12/31/2022   Dysphagia, unspecified 12/31/2022   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 12/31/2022   Hepatitis A immune 11/14/2022   End of life care 10/23/2022   Acute purulent bronchitis 10/23/2022   Hepatitis C 10/09/2022   Left leg pain 10/09/2022   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   History of pancytopenia 09/12/2022   Torticollis 07/04/2022   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   Defective dental restoration 12/31/2021   Chronic periodontitis 12/31/2021   Impacted third molar tooth 12/31/2021   Irritant contact dermatitis associated with digestive stoma 12/25/2021   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  Continuous tobacco abuse 11/21/2021   Dermatitis associated with moisture 10/16/2021   Back pain 08/16/2021   Candidal endocarditis    History of radiation to head and neck region 07/11/2021   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   Dysgeusia 07/11/2021   Pressure injury of skin 06/17/2021   Tracheostomy status (HCC)    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   Heart failure with reduced ejection fraction (HCC) 04/28/2021   Anxiety 04/28/2021   Elevated troponin 04/01/2021   Elevated transaminase level 04/01/2021   Weight loss, unintentional 01/15/2021   Protein-calorie malnutrition, severe (HCC) 01/10/2021   Hypotension 01/08/2021   Chemotherapy  induced nausea and vomiting 01/01/2021   Encounter for dental examination 12/13/2020   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   Teeth missing 12/04/2020   Radiation caries 12/04/2020   Retained tooth root 12/04/2020   Chronic apical periodontitis 12/04/2020   Accretions on teeth 12/04/2020    PCP: Gawaluck, [place], MD   REFERRING PROVIDER: Jeb Miner, NP   REFERRING DIAG: Diagnosis Information  Diagnosis  C32.9 (ICD-10-CM) - Laryngeal cancer (HCC)    THERAPY DIAG:  Cervicalgia  Muscle weakness (generalized)  Abnormal posture  Rationale for Evaluation and Treatment: Rehabilitation  ONSET DATE: 1.5 year history (trach placement)  SUBJECTIVE:                                                                                                                                                                                                         SUBJECTIVE STATEMENT: They couldn't take the x-ray for my dentures because my neck is so bend.    From Eval: Pt presents to PT with Rt sided neck tension and shortened tissue secondary to radiation.  Pt has difficulty holding up his head due to pain, postural weakness, and shortened tissue on the Rt due to radiation.  Pt was discharged from PT in August due to clot in Rt jugular vein.  Clot is now resolved.  He reports that he has been stretching and working on posture at home.   Hand dominance: Right  PERTINENT HISTORY:  Tracheostomy, radiation to neck, malignant neoplasm of supraglottis, thrombosis of Rt internal jugular vein, torticollis, heart failure  PAIN: 06/11/2023 Are you having pain? Yes: NPRS scale: 0/10, just stiff Pain location: Rt neck Pain description: stiff, throbbing Aggravating factors: overstretching, trying to stretch  Relieving factors: massaging, medication  PRECAUTIONS: Other: cancer, trach  WEIGHT BEARING RESTRICTIONS: No  FALLS:  Has patient fallen in last 6 months? No  LIVING  ENVIRONMENT: Lives with: lives alone Lives in: House/apartment   PLOF: Independent,  walking to mailbox  PATIENT GOALS: reduce neck pain, improve mobility   NEXT MD VISIT:  January 2025  OBJECTIVE:   DIAGNOSTIC FINDINGS:  None   PATIENT SURVEYS:  04/23/23: 6 min walk test: 1217 feet (2339 is age related norm)  NDI 15/50 03/10/23: 6 min walk test: 1135 feet (age related norm in healthy male is 2339 ft)- RPE 5/10 The Patient-Specific Functional Scale (PSFS)  Initial:  I am going to ask you to identify up to 3 important activities that you are unable to do or are having difficulty with as a result of this problem.  Today are there any activities that you are unable to do or having difficulty with because of this?  (Patient shown scale and patient rated each activity)  Follow up: When you first came in you had difficulty performing these activities.  Today do you still have difficulty?  Patient-Specific activity scoring scheme (Point to one number):  0 1 2 3 4 5 6 7 8 9  10 Unable                                                                                                          Able to perform To perform                                                                                                    activity at the same Activity         Level as before                                                                                                                       Injury or problem  Activity   Taking out trash                                                   Initial:  8/10  follow up:     COGNITION: Overall cognitive status: Within functional limits for tasks assessed  SENSATION: WFL  POSTURE: rounded shoulders, forward head, increased thoracic kyphosis, and flexed trunk   PALPATION: Significant tension and reduced muscle length in bil neck. Fixed cervical spine segments with minimal mobility allowed.    CERVICAL ROM:   Active ROM A/PROM  (deg) 01/13/23  Flexion Rests in 30 degrees flexion  Extension   Right lateral flexion Rests in 30 degrees  Left lateral flexion 10 degrees Rt lateral flexion  Right rotation 50 degrees   Left rotation 25 degrees    (Blank rows = not tested)  UPPER EXTREMITY ROM:  UE A/ROM is WFLs without pain  UPPER EXTREMITY MMT: 4+/5 bil UE strength, neck strength 4-/5 throughout   TODAY'S TREATMENT:                                                                                                                              DATE:   06/11/23 NuStep: Level 4-arms and legs with cues to keep head upright x 8 minutes- PT present to discuss progress 6" step-ups 2x10 Rt and Lt-verbal cues to keep eyes on fixed point to promote upright head Sit to stand with 5# kettlebell chest press 2x10 Shoulder flexion and scaption: 3# 2x10 bil each Seated row: 25# 2x10 Farmer's carry with 5# kettlebell - 1 lap in each hand  Manual: IASTM to Lt and Rt neck in sitting  06/04/23 NuStep: Level 4-arms and legs with cues to keep head upright x 8 minutes- PT present to discuss progress 6" step-ups x15 Rt and Lt Self snags with towel x5 Sit to stand with 5# kettlebell chest press 2x10 Shoulder flexion and scaption: 3# 2x10 bil each Seated row:  Farmer's carry with 5# kettlebell - 1 lap in each hand  Manual: IASTM to Lt and Rt neck in sitting  05/07/23 NuStep: Level 4-arms and legs with cues to keep head upright x 8 minutes- PT present to discuss progress 6" step-ups x15 Rt and Lt Self snags with towel x5 Sit to stand with 5# kettlebell chest press 2x10 Shoulder flexion and scaption: 3# 2x10 bil each Standing rows and shoulder extension with green band: 2x10 bil- good control today Farmer's carry with 5# kettlebell - 1 lap in each hand  Manual: IASTM to Lt and Rt neck in sitting   PATIENT EDUCATION:  Education details: Access Code: [email or web address] Person educated: Patient Education method: Explanation, Demonstration,  and Handouts Education comprehension: verbalized understanding and returned demonstration  HOME EXERCISE PROGRAM: Access Code: [email or web address] URL: [email or web address] Date: 03/05/2023 Prepared by: Loetta Ringer  Exercises - Seated Scapular Retraction  - 5 x daily - 7 x weekly - 1 sets - 10 reps - 5 hold - Supine Scapular Retraction  - 3 x daily - 7 x weekly - 1 sets - 10 reps - 5 hold - Seated Correct Posture  - 1 x daily - 7 x weekly -  3 sets - 10 reps - Seated Cervical Retraction  - 1 x daily - 7 x weekly - 3 sets - 10 reps - Supine Chin Tuck  - 1 x daily - 7 x weekly - 3 sets - 10 reps - Seated Cervical Sidebending AROM  - 3 x daily - 7 x weekly - 1 sets - 10 reps - 5-10 hold - Seated Assisted Cervical Rotation with Towel  - 5 x daily - 7 x weekly - 1 sets - 3 reps - 10 hold - Shoulder External Rotation and Scapular Retraction with Resistance  - 1 x daily - 7 x weekly - 2 sets - 10 reps - Standing Shoulder Row with Anchored Resistance  - 1 x daily - 7 x weekly - 2 sets - 10 reps - Shoulder extension with resistance - Neutral  - 1 x daily - 7 x weekly - 2 sets - 10 reps - Sit to Stand Without Arm Support  - 2 x daily - 7 x weekly - 2 sets - 5 reps  ASSESSMENT:  CLINICAL IMPRESSION: Pt continues to work on upright posture at home and exercises for strength and endurance. He had to walk for 3 blocks yesterday and reports less fatigue.    Pt with a chronic condition and PT helps to improve quality of life through strength and endurance building.  Rt side of the neck remains tight due to fixed Rt lateral flexion.  He responds well to IASTM. He does report functional improvements with ability to drink and look over the dash with less substitution.  PT monitored throughout session for safety and to advance as appropriate. Patient will benefit from skilled PT to address the below impairments and improve overall function.  OBJECTIVE IMPAIRMENTS: decreased activity tolerance, increased muscle  spasms, impaired flexibility, postural dysfunction, and pain.   ACTIVITY LIMITATIONS: sitting, standing, and reach over head  PARTICIPATION LIMITATIONS: meal prep, driving, and community activity  PERSONAL FACTORS: 1-2 comorbidities: cancer on pallative care, chronic postural dysfunction are also affecting patient's functional outcome.   REHAB POTENTIAL: Good  CLINICAL DECISION MAKING: Stable/uncomplicated  EVALUATION COMPLEXITY: Moderate   GOALS: Goals reviewed with patient? Yes  SHORT TERM GOALS: Target date: 02/10/2023      Be independent in initial  HEP Baseline:  Goal status: MET  2.  Verbalize and demonstrate postural corrections to improve tolerance for neutral posture and reduced neck strain  Baseline:  Goal status: MET   LONG TERM GOALS: Target date: 06/27/23    Be independent in advanced HEP Baseline:  Goal status: In progress   3.  Improve taking out  the trash to 9/10 on PSFS due to improved strength and endurance  Baseline: 8/10 Goal status: In progress   3.   Perform independent postural correction and hold this x 5  minutes  during activity  Baseline: needs visual feedback and can hold 2-3 min Goal status: in progress    4.   Improve 6 min walk test to 1500 feet to improve community function  Baseline: 1217 feet (04/23/23) Goal status: in progress    5.  Initiate a regular walking program and verbalize safe progression for endurance gains   Baseline: walking as able at home (04/23/23)   Goal status: in progress     PLAN:  PT FREQUENCY: 1x/week  PT DURATION: 6 weeks  PLANNED INTERVENTIONS: Therapeutic exercises, Therapeutic activity, Neuromuscular re-education, Balance training, Gait training, Patient/Family education, Self Care, Joint mobilization, Aquatic Therapy, Dry Needling, Electrical stimulation,  Spinal mobilization, Cryotherapy, Moist heat, Taping, Traction, Manual therapy, and Re-evaluation  PLAN FOR NEXT SESSION: Address cervical  alignment, mobility, posture and functional mobility. Endurance and functional tasks paired with upright posture   Luella Sager, PT 06/11/23 8:47 AM  [facility] [address] [place], [place] [address] Phone # [phone] Fax [phone]

## 2023-06-18 ENCOUNTER — Ambulatory Visit: Payer: MEDICAID

## 2023-06-18 DIAGNOSIS — R293 Abnormal posture: Secondary | ICD-10-CM

## 2023-06-18 DIAGNOSIS — M6281 Muscle weakness (generalized): Secondary | ICD-10-CM

## 2023-06-18 DIAGNOSIS — M542 Cervicalgia: Secondary | ICD-10-CM | POA: Diagnosis not present

## 2023-06-18 NOTE — Progress Notes (Deleted)
 David [name]

## 2023-06-18 NOTE — Therapy (Signed)
 OUTPATIENT PHYSICAL THERAPY TREATMENT   Patient Name: David [name] MRN: [id number] DOB:[birth year], 48 y.o., male Today's Date: 06/18/2023     PT End of Session - 06/18/23 0813     Visit Number 17    Date for PT Re-Evaluation 08/13/23    Authorization Type 6 visits-05/06/2023-5/30/202- requested more visits on 06/18/23    Authorization - Visit Number 4    Authorization - Number of Visits 6    PT Start Time 0801    PT Stop Time 0845    PT Time Calculation (min) 44 min    Activity Tolerance Patient tolerated treatment well    Behavior During Therapy [facility] for tasks assessed/performed               PT End of Session - 06/18/23 0813     Visit Number 17    Date for PT Re-Evaluation 08/13/23    Authorization Type 6 visits-05/06/2023-5/30/202- requested more visits on 06/18/23    Authorization - Visit Number 4    Authorization - Number of Visits 6    PT Start Time 0801    PT Stop Time 0845    PT Time Calculation (min) 44 min    Activity Tolerance Patient tolerated treatment well    Behavior During Therapy [facility] for tasks assessed/performed                              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)    Pneumococcal bacteremia 06/09/2021   Pneumothorax    Left lung spontaneous pneumothorax at age 62 yr    S/P percutaneous endoscopic gastrostomy (PEG) tube placement (HCC) 06/24/2021   Past Surgical History:  Procedure Laterality Date   chest tubes  Left    IR GASTROSTOMY TUBE MOD SED  06/18/2021   LAPAROSCOPIC INSERTION GASTROSTOMY TUBE N/A 06/20/2021   Procedure: LAPAROSCOPIC INSERTION GASTROSTOMY TUBE;  Surgeon: Adalberto Acton, MD;  Location: [facility] OR;  Service: General;  Laterality: N/A;  DOW PATIENT   TRACHEOSTOMY TUBE PLACEMENT N/A 06/09/2021   Procedure: AWAKE TRACHEOSTOMY;  Surgeon: Virgina Grills, MD;  Location: [facility] OR;  Service: ENT;  Laterality: N/A;   Patient Active Problem List   Diagnosis Date Noted   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 04/03/2023   Difficulty urinating 03/27/2023   Subclinical hypothyroidism 02/10/2023   FTT (failure to thrive) in adult 12/31/2022   Dysphagia, unspecified 12/31/2022   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 12/31/2022   Hepatitis A immune 11/14/2022   End of life care 10/23/2022   Acute purulent bronchitis 10/23/2022   Hepatitis C 10/09/2022   Left leg pain 10/09/2022   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   History of pancytopenia 09/12/2022   Torticollis 07/04/2022   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   Defective dental restoration 12/31/2021   Chronic periodontitis 12/31/2021   Impacted third molar tooth 12/31/2021   Irritant contact  dermatitis associated with digestive stoma 12/25/2021   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   Continuous tobacco abuse 11/21/2021   Dermatitis associated with moisture 10/16/2021   Back pain 08/16/2021   Candidal endocarditis    History of radiation to head and neck region 07/11/2021   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   Dysgeusia 07/11/2021   Pressure injury of skin 06/17/2021   Tracheostomy status (HCC)    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   Heart failure with reduced ejection fraction (HCC) 04/28/2021   Anxiety 04/28/2021   Elevated troponin 04/01/2021   Elevated transaminase level 04/01/2021   Weight loss, unintentional 01/15/2021   Protein-calorie  malnutrition, severe (HCC) 01/10/2021   Hypotension 01/08/2021   Chemotherapy induced nausea and vomiting 01/01/2021   Encounter for dental examination 12/13/2020   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   Teeth missing 12/04/2020   Radiation caries 12/04/2020   Retained tooth root 12/04/2020   Chronic apical periodontitis 12/04/2020   Accretions on teeth 12/04/2020    PCP: Gawaluck, [place], MD   REFERRING PROVIDER: Jeb Miner, NP   REFERRING DIAG: Diagnosis Information  Diagnosis  C32.9 (ICD-10-CM) - Laryngeal cancer (HCC)    THERAPY DIAG:  Cervicalgia - Plan: PT plan of care cert/re-cert  Muscle weakness (generalized) - Plan: PT plan of care cert/re-cert  Abnormal posture - Plan: PT plan of care cert/re-cert  Rationale for Evaluation and Treatment: Rehabilitation  ONSET DATE: 1.5 year history (trach placement)  SUBJECTIVE:                                                                                                                                                                                                         SUBJECTIVE STATEMENT: They couldn't take the x-ray for my dentures because my neck is so bent.  I need to get my head straight so I can get the x-ray.   From Eval: Pt presents to PT with Rt sided neck tension and shortened tissue secondary to radiation.  Pt has difficulty holding up his head due to pain, postural weakness, and shortened tissue on the Rt due to radiation.  Pt was discharged from PT in August due to clot in Rt jugular vein.  Clot is now resolved.  He reports that he has been stretching and working on posture at home.   Hand dominance: Right  PERTINENT HISTORY:  Tracheostomy, radiation to neck, malignant neoplasm of supraglottis, thrombosis of Rt internal jugular vein, torticollis, heart failure  PAIN: 06/18/2023 Are you having pain? Yes: NPRS scale: 0/10, just stiff Pain location: Rt neck Pain description: stiff,  throbbing Aggravating factors: overstretching, trying to stretch  Relieving factors: massaging, medication  PRECAUTIONS: Other: cancer, trach  WEIGHT BEARING RESTRICTIONS: No  FALLS:  Has patient fallen in last 6 months? No  LIVING ENVIRONMENT: Lives with: lives alone Lives in: House/apartment   PLOF: Independent, walking to mailbox  PATIENT GOALS: reduce neck pain, improve mobility   NEXT MD VISIT:  January 2025  OBJECTIVE:   DIAGNOSTIC FINDINGS:  None   PATIENT SURVEYS:  06/18/23: 6 min walk test: 1225 feet with RPE 5/10 (2339 is age related norm)  04/23/23: 6 min walk test: 1217 feet (2339 is age related norm)  NDI 15/50 03/10/23: 6 min walk test: 1135 feet (age related norm in healthy male is 2339 ft)- RPE 5/10 The Patient-Specific Functional Scale (PSFS)  Initial:  I am going to ask you to identify up to 3 important activities that you are unable to do or are having difficulty with as a result of this problem.  Today are there any activities that you are unable to do or having difficulty with because of this?  (Patient shown scale and patient rated each activity)  Follow up: When you first came in you had difficulty performing these activities.  Today do you still have difficulty?  Patient-Specific activity scoring scheme (Point to one number):  0 1 2 3 4 5 6 7 8 9  10 Unable                                                                                                          Able to perform To perform                                                                                                    activity at the same Activity         Level as before                                                                                                                       Injury or problem  Activity  Tip head to take a drink  Initial:  5/10     (06/18/23)                follow up:    COGNITION: Overall cognitive  status: Within functional limits for tasks assessed  SENSATION: WFL  POSTURE: rounded shoulders, forward head, increased thoracic kyphosis, and flexed trunk   PALPATION: Significant tension and reduced muscle length in bil neck. Fixed cervical spine segments with minimal mobility allowed.    CERVICAL ROM:   Active ROM A/PROM (deg) 01/13/23  Flexion Rests in 30 degrees flexion  Extension   Right lateral flexion Rests in 30 degrees  Left lateral flexion 10 degrees Rt lateral flexion  Right rotation 50 degrees   Left rotation 25 degrees    (Blank rows = not tested)  UPPER EXTREMITY ROM:  UE A/ROM is WFLs without pain  UPPER EXTREMITY MMT: 4+/5 bil UE strength, neck strength 4-/5 throughout   TODAY'S TREATMENT:                                                                                                                              DATE:   06/18/23 NuStep: Level 4-arms and legs with cues to keep head upright x 8 minutes- PT present to discuss progress 6 min walk test- see above  Sit to stand with 5# kettlebell chest press 2x10 Shoulder flexion and scaption: 3# 2x10 bil each Cervical sidebending isometrics 5" x10 and stretch into neutral with overpressure x3 Manual: IASTM to Lt and Rt neck in sitting  06/11/23 NuStep: Level 4-arms and legs with cues to keep head upright x 8 minutes- PT present to discuss progress 6" step-ups 2x10 Rt and Lt-verbal cues to keep eyes on fixed point to promote upright head Sit to stand with 5# kettlebell chest press 2x10 Shoulder flexion and scaption: 3# 2x10 bil each Seated row: 25# 2x10 Farmer's carry with 5# kettlebell - 1 lap in each hand  Manual: IASTM to Lt and Rt neck in sitting  06/04/23 NuStep: Level 4-arms and legs with cues to keep head upright x 8 minutes- PT present to discuss progress 6" step-ups x15 Rt and Lt Self snags with towel x5 Sit to stand with 5# kettlebell chest press 2x10 Shoulder flexion and scaption: 3# 2x10 bil  each Seated row:  Farmer's carry with 5# kettlebell - 1 lap in each hand  Manual: IASTM to Lt and Rt neck in sitting   PATIENT EDUCATION:  Education details: Access Code: [email or web address] Person educated: Patient Education method: Explanation, Demonstration, and Handouts Education comprehension: verbalized understanding and returned demonstration  HOME EXERCISE PROGRAM: Access Code: [email or web address] URL: [email or web address] Date: 03/05/2023 Prepared by: Loetta Ringer  Exercises - Seated Scapular Retraction  - 5 x daily - 7 x weekly - 1 sets - 10 reps - 5 hold - Supine Scapular Retraction  - 3 x daily - 7 x weekly - 1 sets - 10 reps - 5 hold - Seated Correct Posture  - 1 x  daily - 7 x weekly - 3 sets - 10 reps - Seated Cervical Retraction  - 1 x daily - 7 x weekly - 3 sets - 10 reps - Supine Chin Tuck  - 1 x daily - 7 x weekly - 3 sets - 10 reps - Seated Cervical Sidebending AROM  - 3 x daily - 7 x weekly - 1 sets - 10 reps - 5-10 hold - Seated Assisted Cervical Rotation with Towel  - 5 x daily - 7 x weekly - 1 sets - 3 reps - 10 hold - Shoulder External Rotation and Scapular Retraction with Resistance  - 1 x daily - 7 x weekly - 2 sets - 10 reps - Standing Shoulder Row with Anchored Resistance  - 1 x daily - 7 x weekly - 2 sets - 10 reps - Shoulder extension with resistance - Neutral  - 1 x daily - 7 x weekly - 2 sets - 10 reps - Sit to Stand Without Arm Support  - 2 x daily - 7 x weekly - 2 sets - 5 reps  ASSESSMENT:  CLINICAL IMPRESSION: Pt continues to work on upright posture at home and exercises for strength and endurance. He is trying to get dentures and needs to get his head more upright in order to get x-rays.  Pt with a chronic condition and PT helps to improve quality of life through strength and endurance building.  Rt side of the neck remains tight due to fixed Rt lateral flexion.  He responds well to IASTM for tissue mobility. He does report functional improvements with ability  to drink and look over the dash with less substitution.  He can PT monitored throughout session for safety and to advance as appropriate. Patient will benefit from skilled PT to address the below impairments and improve overall function.  OBJECTIVE IMPAIRMENTS: decreased activity tolerance, increased muscle spasms, impaired flexibility, postural dysfunction, and pain.   ACTIVITY LIMITATIONS: sitting, standing, and reach over head  PARTICIPATION LIMITATIONS: meal prep, driving, and community activity  PERSONAL FACTORS: 1-2 comorbidities: cancer on pallative care, chronic postural dysfunction are also affecting patient's functional outcome.   REHAB POTENTIAL: Good  CLINICAL DECISION MAKING: Stable/uncomplicated  EVALUATION COMPLEXITY: Moderate   GOALS: Goals reviewed with patient? Yes  SHORT TERM GOALS: Target date: 02/10/2023      Be independent in initial  HEP Baseline:  Goal status: MET  2.  Verbalize and demonstrate postural corrections to improve tolerance for neutral posture and reduced neck strain  Baseline:  Goal status: MET   LONG TERM GOALS: Target date:08/13/23    Be independent in advanced HEP Baseline:  Goal status: In progress   3.  Hold head upright for 3-5 minutes to allow for x-rays to get dentures  Baseline: flexed posture and ear touches shoulder  Goal status:NEW  3.   Walk to the store from home (10 min) without loss of balance due to improved upright posture   Baseline: stumbles frequently due to flexed head position Goal status:NEW   4.   Improve 6 min walk test to 1350 feet to improve community function  Baseline: 1225 feet (06/18/23) with RPE 5/10 Goal status: in progress    5.  Initiate a regular walking program and verbalize safe progression for endurance gains   Baseline: walking as able at home (04/23/23)   Goal status: in progress     PLAN:  PT FREQUENCY: 1x/week  PT DURATION: 6 weeks  PLANNED INTERVENTIONS: Therapeutic  exercises, Therapeutic  activity, Neuromuscular re-education, Balance training, Gait training, Patient/Family education, Self Care, Joint mobilization, Aquatic Therapy, Dry Needling, Electrical stimulation, Spinal mobilization, Cryotherapy, Moist heat, Taping, Traction, Manual therapy, and Re-evaluation  PLAN FOR NEXT SESSION: Address cervical alignment, mobility, posture and functional mobility. Endurance and functional tasks paired with upright posture   Luella Sager, PT 06/18/23 9:38 AM  [facility] [address] [place], [place] [address] Phone # [phone] Fax [phone]

## 2023-06-20 ENCOUNTER — Ambulatory Visit: Payer: MEDICAID

## 2023-06-20 ENCOUNTER — Ambulatory Visit: Payer: Self-pay | Admitting: Radiation Oncology

## 2023-06-25 ENCOUNTER — Ambulatory Visit: Payer: MEDICAID

## 2023-06-25 DIAGNOSIS — R293 Abnormal posture: Secondary | ICD-10-CM

## 2023-06-25 DIAGNOSIS — M542 Cervicalgia: Secondary | ICD-10-CM | POA: Diagnosis not present

## 2023-06-25 DIAGNOSIS — M6281 Muscle weakness (generalized): Secondary | ICD-10-CM

## 2023-06-25 NOTE — Therapy (Signed)
 OUTPATIENT PHYSICAL THERAPY TREATMENT   Patient Name: Graylin Sperling MRN: [id number] DOB:[birth year], 48 y.o., male Today's Date: 06/25/2023     PT End of Session - 06/25/23 0845     Visit Number 18    Date for PT Re-Evaluation 08/13/23    Authorization Type 6 visits-05/06/2023-5/30/202, and 6 visits-06/27/2023-08/13/2023 (starts on next visit)    Authorization - Visit Number 5    Authorization - Number of Visits 6    PT Start Time 0758    PT Stop Time 0843    PT Time Calculation (min) 45 min    Activity Tolerance Patient tolerated treatment well    Behavior During Therapy [facility] for tasks assessed/performed                PT End of Session - 06/25/23 0845     Visit Number 18    Date for PT Re-Evaluation 08/13/23    Authorization Type 6 visits-05/06/2023-5/30/202, and 6 visits-06/27/2023-08/13/2023 (starts on next visit)    Authorization - Visit Number 5    Authorization - Number of Visits 6    PT Start Time 0758    PT Stop Time 0843    PT Time Calculation (min) 45 min    Activity Tolerance Patient tolerated treatment well    Behavior During Therapy [facility] for tasks assessed/performed                               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)    Pneumococcal bacteremia 06/09/2021   Pneumothorax    Left lung spontaneous pneumothorax at age 4 yr    S/P percutaneous endoscopic gastrostomy (PEG) tube placement (HCC) 06/24/2021   Past Surgical  History:  Procedure Laterality Date   chest tubes Left    IR GASTROSTOMY TUBE MOD SED  06/18/2021   LAPAROSCOPIC INSERTION GASTROSTOMY TUBE N/A 06/20/2021   Procedure: LAPAROSCOPIC INSERTION GASTROSTOMY TUBE;  Surgeon: Adalberto Acton, MD;  Location: [facility] OR;  Service: General;  Laterality: N/A;  DOW PATIENT   TRACHEOSTOMY TUBE PLACEMENT N/A 06/09/2021   Procedure: AWAKE TRACHEOSTOMY;  Surgeon: Virgina Grills, MD;  Location: [facility];  Service: ENT;  Laterality: N/A;   Patient Active Problem List   Diagnosis Date Noted   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 04/03/2023   Difficulty urinating 03/27/2023   Subclinical hypothyroidism 02/10/2023   FTT (failure to thrive) in adult 12/31/2022   Dysphagia, unspecified 12/31/2022   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 12/31/2022   Hepatitis A immune 11/14/2022   End of life care 10/23/2022   Acute purulent bronchitis 10/23/2022   Hepatitis C 10/09/2022   Left leg pain 10/09/2022   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   History of pancytopenia 09/12/2022   Torticollis 07/04/2022   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   Defective dental restoration 12/31/2021   Chronic periodontitis 12/31/2021   Impacted third molar  tooth 12/31/2021   Irritant contact dermatitis associated with digestive stoma 12/25/2021   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   Continuous tobacco abuse 11/21/2021   Dermatitis associated with moisture 10/16/2021   Back pain 08/16/2021   Candidal endocarditis    History of radiation to head and neck region 07/11/2021   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   Dysgeusia 07/11/2021   Pressure injury of skin 06/17/2021   Tracheostomy status (HCC)    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   Heart failure with reduced ejection fraction (HCC) 04/28/2021   Anxiety 04/28/2021   Elevated troponin 04/01/2021   Elevated transaminase level 04/01/2021   Weight  loss, unintentional 01/15/2021   Protein-calorie malnutrition, severe (HCC) 01/10/2021   Hypotension 01/08/2021   Chemotherapy induced nausea and vomiting 01/01/2021   Encounter for dental examination 12/13/2020   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   Teeth missing 12/04/2020   Radiation caries 12/04/2020   Retained tooth root 12/04/2020   Chronic apical periodontitis 12/04/2020   Accretions on teeth 12/04/2020    PCP: Gawaluck, [place], MD   REFERRING PROVIDER: Jeb Miner, NP   REFERRING DIAG: Diagnosis Information  Diagnosis  C32.9 (ICD-10-CM) - Laryngeal cancer (HCC)    THERAPY DIAG:  Cervicalgia  Muscle weakness (generalized)  Abnormal posture  Rationale for Evaluation and Treatment: Rehabilitation  ONSET DATE: 1.5 year history (trach placement)  SUBJECTIVE:                                                                                                                                                                                                         SUBJECTIVE STATEMENT: I'm doing OK.  I'm still depressed.   From Eval: Pt presents to PT with Rt sided neck tension and shortened tissue secondary to radiation.  Pt has difficulty holding up his head due to pain, postural weakness, and shortened tissue on the Rt due to radiation.  Pt was discharged from PT in August due to clot in Rt jugular vein.  Clot is now resolved.  He reports that he has been stretching and working on posture at home.   Hand dominance: Right  PERTINENT HISTORY:  Tracheostomy, radiation to neck, malignant neoplasm of supraglottis, thrombosis of Rt internal jugular vein, torticollis, heart failure  PAIN: 06/25/2023 Are you having pain? Yes: NPRS scale: 0/10, just stiff Pain location: Rt neck Pain description: stiff, throbbing Aggravating factors: overstretching, trying to stretch  Relieving factors: massaging, medication  PRECAUTIONS: Other: cancer, trach  WEIGHT BEARING  RESTRICTIONS: No  FALLS:  Has patient fallen in last 6 months? No  LIVING ENVIRONMENT:  Lives with: lives alone Lives in: House/apartment   PLOF: Independent, walking to mailbox  PATIENT GOALS: reduce neck pain, improve mobility   NEXT MD VISIT:  January 2025  OBJECTIVE:   DIAGNOSTIC FINDINGS:  None   PATIENT SURVEYS:  06/18/23: 6 min walk test: 1225 feet with RPE 5/10 (2339 is age related norm)  04/23/23: 6 min walk test: 1217 feet (2339 is age related norm)  NDI 15/50 03/10/23: 6 min walk test: 1135 feet (age related norm in healthy male is 2339 ft)- RPE 5/10 The Patient-Specific Functional Scale (PSFS)  Initial:  I am going to ask you to identify up to 3 important activities that you are unable to do or are having difficulty with as a result of this problem.  Today are there any activities that you are unable to do or having difficulty with because of this?  (Patient shown scale and patient rated each activity)  Follow up: When you first came in you had difficulty performing these activities.  Today do you still have difficulty?  Patient-Specific activity scoring scheme (Point to one number):  0 1 2 3 4 5 6 7 8 9  10 Unable                                                                                                          Able to perform To perform                                                                                                    activity at the same Activity         Level as before                                                                                                                       Injury or problem  Activity  Tip head to take a drink  Initial:  5/10     (06/18/23)                follow up:    COGNITION: Overall cognitive status: Within functional limits for tasks assessed  SENSATION: WFL  POSTURE: rounded shoulders, forward head, increased thoracic kyphosis, and flexed trunk    PALPATION: Significant tension and reduced muscle length in bil neck. Fixed cervical spine segments with minimal mobility allowed.    CERVICAL ROM:   Active ROM A/PROM (deg) 01/13/23  Flexion Rests in 30 degrees flexion  Extension   Right lateral flexion Rests in 30 degrees  Left lateral flexion 10 degrees Rt lateral flexion  Right rotation 50 degrees   Left rotation 25 degrees    (Blank rows = not tested)  UPPER EXTREMITY ROM:  UE A/ROM is WFLs without pain  UPPER EXTREMITY MMT: 4+/5 bil UE strength, neck strength 4-/5 throughout   TODAY'S TREATMENT:                                                                                                                              DATE:   06/25/23 NuStep: Level 4-arms and legs with cues to keep head upright x 8 minutes- PT present to discuss progress Seated row: 25# 2x10 Leg press: seat 7 55# 2x10 bil  Sit to stand with 5# kettlebell chest press 2x10 Shoulder flexion and scaption: 3# 2x10 bil each Wall clocks: yellow loop x5 each side  Walking in reverse: 10# with scap retraction x10 reps  Farmer's carry with 5# kettlebell - 1 lap in each hand  Cervical sidebending isometrics 5" x10 and stretch into neutral with overpressure x3 Manual: IASTM to Lt and Rt neck in sitting  06/18/23 NuStep: Level 4-arms and legs with cues to keep head upright x 8 minutes- PT present to discuss progress 6 min walk test- see above  Sit to stand with 5# kettlebell chest press 2x10 Shoulder flexion and scaption: 3# 2x10 bil each Cervical sidebending isometrics 5" x10 and stretch into neutral with overpressure x3 Manual: IASTM to Lt and Rt neck in sitting  06/11/23 NuStep: Level 4-arms and legs with cues to keep head upright x 8 minutes- PT present to discuss progress 6" step-ups 2x10 Rt and Lt-verbal cues to keep eyes on fixed point to promote upright head Sit to stand with 5# kettlebell chest press 2x10 Shoulder flexion and scaption: 3# 2x10 bil  each Seated row: 25# 2x10 Farmer's carry with 5# kettlebell - 1 lap in each hand  Manual: IASTM to Lt and Rt neck in sitting   PATIENT EDUCATION:  Education details: Access Code: [email or web address] Person educated: Patient Education method: Explanation, Demonstration, and Handouts Education comprehension: verbalized understanding and returned demonstration  HOME EXERCISE PROGRAM: Access Code: [email or web address] URL: [email or web address] Date: 03/05/2023 Prepared by: Loetta Ringer  Exercises - Seated Scapular Retraction  - 5 x daily - 7 x weekly - 1 sets - 10 reps - 5 hold - Supine  Scapular Retraction  - 3 x daily - 7 x weekly - 1 sets - 10 reps - 5 hold - Seated Correct Posture  - 1 x daily - 7 x weekly - 3 sets - 10 reps - Seated Cervical Retraction  - 1 x daily - 7 x weekly - 3 sets - 10 reps - Supine Chin Tuck  - 1 x daily - 7 x weekly - 3 sets - 10 reps - Seated Cervical Sidebending AROM  - 3 x daily - 7 x weekly - 1 sets - 10 reps - 5-10 hold - Seated Assisted Cervical Rotation with Towel  - 5 x daily - 7 x weekly - 1 sets - 3 reps - 10 hold - Shoulder External Rotation and Scapular Retraction with Resistance  - 1 x daily - 7 x weekly - 2 sets - 10 reps - Standing Shoulder Row with Anchored Resistance  - 1 x daily - 7 x weekly - 2 sets - 10 reps - Shoulder extension with resistance - Neutral  - 1 x daily - 7 x weekly - 2 sets - 10 reps - Sit to Stand Without Arm Support  - 2 x daily - 7 x weekly - 2 sets - 5 reps  ASSESSMENT:  CLINICAL IMPRESSION: Pt continues to work on upright posture at home and exercises for strength and endurance. He is challenged by current level of activity in the clinic and PT monitored throughout session.  Pt expresses continued depression today.  PT educated pt in benefits of physical activity and exercise to help with this and suggested he discuss a referral for counseling with his MD.  He doesn't express any interest in self-harm. Pt with a chronic condition and  PT helps to improve quality of life through strength and endurance building.  Rt side of the neck remains tight due to fixed Rt lateral flexion.  He responds well to IASTM for tissue mobility and reports improved mobility and reduced symptoms post session. He does report functional improvements with ability to drink and look over the dash with less substitution.  He can PT monitored throughout session for safety and to advance as appropriate. Patient will benefit from skilled PT to address the below impairments and improve overall function.  OBJECTIVE IMPAIRMENTS: decreased activity tolerance, increased muscle spasms, impaired flexibility, postural dysfunction, and pain.   ACTIVITY LIMITATIONS: sitting, standing, and reach over head  PARTICIPATION LIMITATIONS: meal prep, driving, and community activity  PERSONAL FACTORS: 1-2 comorbidities: cancer on pallative care, chronic postural dysfunction are also affecting patient's functional outcome.   REHAB POTENTIAL: Good  CLINICAL DECISION MAKING: Stable/uncomplicated  EVALUATION COMPLEXITY: Moderate   GOALS: Goals reviewed with patient? Yes  SHORT TERM GOALS: Target date: 02/10/2023      Be independent in initial  HEP Baseline:  Goal status: MET  2.  Verbalize and demonstrate postural corrections to improve tolerance for neutral posture and reduced neck strain  Baseline:  Goal status: MET   LONG TERM GOALS: Target date:08/13/23    Be independent in advanced HEP Baseline:  Goal status: In progress   3.  Hold head upright for 3-5 minutes to allow for x-rays to get dentures  Baseline: flexed posture and ear touches shoulder  Goal status: NEW  3.   Walk to the store from home (10 min) without loss of balance due to improved upright posture   Baseline: stumbles frequently due to flexed head position Goal status:NEW   4.   Improve 6 min  walk test to 1350 feet to improve community function  Baseline: 1225 feet (06/18/23) with RPE  5/10 Goal status: in progress    5.  Initiate a regular walking program and verbalize safe progression for endurance gains   Baseline: walking as able at home (04/23/23)   Goal status: in progress     PLAN:  PT FREQUENCY: 1x/week  PT DURATION: 6 weeks  PLANNED INTERVENTIONS: Therapeutic exercises, Therapeutic activity, Neuromuscular re-education, Balance training, Gait training, Patient/Family education, Self Care, Joint mobilization, Aquatic Therapy, Dry Needling, Electrical stimulation, Spinal mobilization, Cryotherapy, Moist heat, Taping, Traction, Manual therapy, and Re-evaluation  PLAN FOR NEXT SESSION: Address cervical alignment, mobility, posture and functional mobility. Endurance and functional tasks paired with upright posture   Luella Sager, PT 06/25/23 8:46 AM  [facility] [address], [address] [place], [place] [address] Phone # [phone] Fax [phone]

## 2023-07-02 ENCOUNTER — Ambulatory Visit: Payer: MEDICAID | Attending: Radiation Oncology | Admitting: Physical Therapy

## 2023-07-02 ENCOUNTER — Ambulatory Visit
Admission: RE | Admit: 2023-07-02 | Discharge: 2023-07-02 | Disposition: A | Discharge status: Home / Self Care | Payer: MEDICAID | Source: Ambulatory Visit | Attending: Radiation Oncology | Admitting: Radiation Oncology

## 2023-07-02 ENCOUNTER — Other Ambulatory Visit: Payer: Self-pay | Admitting: Nurse Practitioner

## 2023-07-02 ENCOUNTER — Encounter: Payer: Self-pay | Admitting: Physical Therapy

## 2023-07-02 VITALS — BP 106/79 | HR 58 | Temp 97.5°F | Resp 20 | Ht 71.0 in | Wt 99.6 lb

## 2023-07-02 DIAGNOSIS — C321 Malignant neoplasm of supraglottis: Secondary | ICD-10-CM

## 2023-07-02 DIAGNOSIS — C328 Malignant neoplasm of overlapping sites of larynx: Secondary | ICD-10-CM | POA: Insufficient documentation

## 2023-07-02 DIAGNOSIS — R293 Abnormal posture: Secondary | ICD-10-CM | POA: Diagnosis present

## 2023-07-02 DIAGNOSIS — M62838 Other muscle spasm: Secondary | ICD-10-CM

## 2023-07-02 DIAGNOSIS — M6281 Muscle weakness (generalized): Secondary | ICD-10-CM | POA: Diagnosis present

## 2023-07-02 DIAGNOSIS — F419 Anxiety disorder, unspecified: Secondary | ICD-10-CM

## 2023-07-02 DIAGNOSIS — M542 Cervicalgia: Secondary | ICD-10-CM | POA: Diagnosis present

## 2023-07-02 DIAGNOSIS — Z515 Encounter for palliative care: Secondary | ICD-10-CM

## 2023-07-02 DIAGNOSIS — C329 Malignant neoplasm of larynx, unspecified: Secondary | ICD-10-CM

## 2023-07-02 DIAGNOSIS — G893 Neoplasm related pain (acute) (chronic): Secondary | ICD-10-CM

## 2023-07-02 LAB — T4, FREE: Free T4: 0.67 ng/dL (ref 0.61–1.12)

## 2023-07-02 MED ORDER — DIAZEPAM 5 MG/5ML PO SOLN: 3.0000 mg | Freq: Three times a day (TID) | ORAL | 0 refills | Status: DC | PRN

## 2023-07-02 MED ORDER — PREGABALIN 20 MG/ML PO SOLN: 50.0000 mg | Freq: Three times a day (TID) | ORAL | 1 refills | Status: DC

## 2023-07-02 NOTE — Therapy (Signed)
 OUTPATIENT PHYSICAL THERAPY TREATMENT   Patient Name: David [name] MRN: [id number] DOB:1975-11-10, 48 y.o., male Today's Date: 07/02/2023     PT End of Session - 07/02/23 0938     Visit Number 19    Date for PT Re-Evaluation 08/13/23    Authorization Type 6 visits-06/27/2023-08/13/2023 (starts on next visit)    Authorization Time Period 06/27/2023-08/13/2023    Authorization - Visit Number 1    Authorization - Number of Visits 6    PT Start Time 0847    PT Stop Time 0928    PT Time Calculation (min) 41 min    Activity Tolerance Patient tolerated treatment well    Behavior During Therapy [facility] for tasks assessed/performed                 PT End of Session - 07/02/23 8657     Visit Number 19    Date for PT Re-Evaluation 08/13/23    Authorization Type 6 visits-06/27/2023-08/13/2023 (starts on next visit)    Authorization Time Period 06/27/2023-08/13/2023    Authorization - Visit Number 1    Authorization - Number of Visits 6    PT Start Time 0847    PT Stop Time 0928    PT Time Calculation (min) 41 min    Activity Tolerance Patient tolerated treatment well    Behavior During Therapy [facility] for tasks assessed/performed                                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)    Pneumococcal bacteremia 06/09/2021   Pneumothorax    Left lung spontaneous pneumothorax at age 18 yr    S/P percutaneous endoscopic gastrostomy (PEG) tube placement  (HCC) 06/24/2021   Past Surgical History:  Procedure Laterality Date   chest tubes Left    IR GASTROSTOMY TUBE MOD SED  06/18/2021   LAPAROSCOPIC INSERTION GASTROSTOMY TUBE N/A 06/20/2021   Procedure: LAPAROSCOPIC INSERTION GASTROSTOMY TUBE;  Surgeon: Adalberto Acton, MD;  Location: [facility] OR;  Service: General;  Laterality: N/A;  DOW PATIENT   TRACHEOSTOMY TUBE PLACEMENT N/A 06/09/2021   Procedure: AWAKE TRACHEOSTOMY;  Surgeon: Virgina Grills, MD;  Location: [facility] OR;  Service: ENT;  Laterality: N/A;   Patient Active Problem List   Diagnosis Date Noted   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 04/03/2023   Difficulty urinating 03/27/2023   Subclinical hypothyroidism 02/10/2023   FTT (failure to thrive) in adult 12/31/2022   Dysphagia, unspecified 12/31/2022   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 12/31/2022   Hepatitis A immune 11/14/2022   End of life care 10/23/2022   Acute purulent bronchitis 10/23/2022   Hepatitis C 10/09/2022   Left leg pain 10/09/2022   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   History of pancytopenia 09/12/2022   Torticollis 07/04/2022   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   Defective dental restoration 12/31/2021  Chronic periodontitis 12/31/2021   Impacted third molar tooth 12/31/2021   Irritant contact dermatitis associated with digestive stoma 12/25/2021   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   Continuous tobacco abuse 11/21/2021   Dermatitis associated with moisture 10/16/2021   Back pain 08/16/2021   Candidal endocarditis    History of radiation to head and neck region 07/11/2021   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   Dysgeusia 07/11/2021   Pressure injury of skin 06/17/2021   Tracheostomy status (HCC)    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   Heart failure with reduced ejection fraction (HCC) 04/28/2021   Anxiety 04/28/2021   Elevated troponin 04/01/2021   Elevated  transaminase level 04/01/2021   Weight loss, unintentional 01/15/2021   Protein-calorie malnutrition, severe (HCC) 01/10/2021   Hypotension 01/08/2021   Chemotherapy induced nausea and vomiting 01/01/2021   Encounter for dental examination 12/13/2020   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   Teeth missing 12/04/2020   Radiation caries 12/04/2020   Retained tooth root 12/04/2020   Chronic apical periodontitis 12/04/2020   Accretions on teeth 12/04/2020    PCP: Gawaluck, [place], MD   REFERRING PROVIDER: Jeb Miner, NP   REFERRING DIAG: Diagnosis Information  Diagnosis  C32.9 (ICD-10-CM) - Laryngeal cancer (HCC)    THERAPY DIAG:  Cervicalgia  Muscle weakness (generalized)  Abnormal posture  Rationale for Evaluation and Treatment: Rehabilitation  ONSET DATE: 1.5 year history (trach placement)  SUBJECTIVE:                                                                                                                                                                                                         SUBJECTIVE STATEMENT: Patient reports he is okay today. He has a headache   From Eval: Pt presents to PT with Rt sided neck tension and shortened tissue secondary to radiation.  Pt has difficulty holding up his head due to pain, postural weakness, and shortened tissue on the Rt due to radiation.  Pt was discharged from PT in August due to clot in Rt jugular vein.  Clot is now resolved.  He reports that he has been stretching and working on posture at home.   Hand dominance: Right  PERTINENT HISTORY:  Tracheostomy, radiation to neck, malignant neoplasm of supraglottis, thrombosis of Rt internal jugular vein, torticollis, heart failure  PAIN: 07/02/2023 Are you having pain? Yes: NPRS scale: 0/10, Pain location: Rt neck Pain description: stiff, throbbing Aggravating factors: overstretching, trying to stretch  Relieving factors: massaging,  medication  PRECAUTIONS: Other: cancer, trach  WEIGHT BEARING RESTRICTIONS: No  FALLS:  Has patient  fallen in last 6 months? No  LIVING ENVIRONMENT: Lives with: lives alone Lives in: House/apartment   PLOF: Independent, walking to mailbox  PATIENT GOALS: reduce neck pain, improve mobility   NEXT MD VISIT:  January 2025  OBJECTIVE:   DIAGNOSTIC FINDINGS:  None   PATIENT SURVEYS:  06/18/23: 6 min walk test: 1225 feet with RPE 5/10 (2339 is age related norm)  04/23/23: 6 min walk test: 1217 feet (2339 is age related norm)  NDI 15/50 03/10/23: 6 min walk test: 1135 feet (age related norm in healthy male is 2339 ft)- RPE 5/10 The Patient-Specific Functional Scale (PSFS)  Initial:  I am going to ask you to identify up to 3 important activities that you are unable to do or are having difficulty with as a result of this problem.  Today are there any activities that you are unable to do or having difficulty with because of this?  (Patient shown scale and patient rated each activity)  Follow up: When you first came in you had difficulty performing these activities.  Today do you still have difficulty?  Patient-Specific activity scoring scheme (Point to one number):  0 1 2 3 4 5 6 7 8 9  10 Unable                                                                                                          Able to perform To perform                                                                                                    activity at the same Activity         Level as before                                                                                                                       Injury or problem  Activity  Tip head to take a drink  Initial:  5/10     (06/18/23)                follow up:    COGNITION: Overall cognitive status: Within functional limits for tasks assessed  SENSATION: WFL  POSTURE: rounded shoulders,  forward head, increased thoracic kyphosis, and flexed trunk   PALPATION: Significant tension and reduced muscle length in bil neck. Fixed cervical spine segments with minimal mobility allowed.    CERVICAL ROM:   Active ROM A/PROM (deg) 01/13/23  Flexion Rests in 30 degrees flexion  Extension   Right lateral flexion Rests in 30 degrees  Left lateral flexion 10 degrees Rt lateral flexion  Right rotation 50 degrees   Left rotation 25 degrees    (Blank rows = not tested)  UPPER EXTREMITY ROM:  UE A/ROM is WFLs without pain  UPPER EXTREMITY MMT: 4+/5 bil UE strength, neck strength 4-/5 throughout   TODAY'S TREATMENT:                                                                                                                              DATE:  07/02/23 NuStep: Level 4-arms and legs with cues to keep head upright x 8 minutes- PT present to discuss progress Seated row: 25# 2x10 Leg press: seat 7 65# 2x10 bil  Sit to stand with 5# kettlebell chest press 2x10 Shoulder flexion and scaption: 3# 2x10 bil each Wall clocks: yellow loop x5 each side  Farmer's carry with 5# kettlebell - 1 lap in each hand  Cervical sidebending isometrics 5" x10 and stretch into neutral with overpressure x3 Manual: IASTM to Lt and Rt neck in sitting  06/25/23 NuStep: Level 4-arms and legs with cues to keep head upright x 8 minutes- PT present to discuss progress Seated row: 25# 2x10 Leg press: seat 7 55# 2x10 bil  Sit to stand with 5# kettlebell chest press 2x10 Shoulder flexion and scaption: 3# 2x10 bil each Wall clocks: yellow loop x5 each side  Walking in reverse: 10# with scap retraction x10 reps  Farmer's carry with 5# kettlebell - 1 lap in each hand  Cervical sidebending isometrics 5" x10 and stretch into neutral with overpressure x3 Manual: IASTM to Lt and Rt neck in sitting  06/18/23 NuStep: Level 4-arms and legs with cues to keep head upright x 8 minutes- PT present to discuss progress 6  min walk test- see above  Sit to stand with 5# kettlebell chest press 2x10 Shoulder flexion and scaption: 3# 2x10 bil each Cervical sidebending isometrics 5" x10 and stretch into neutral with overpressure x3 Manual: IASTM to Lt and Rt neck in sitting   PATIENT EDUCATION:  Education details: Access Code: [email or web address] Person educated: Patient Education method: Explanation, Demonstration, and Handouts Education comprehension: verbalized understanding and returned demonstration  HOME EXERCISE PROGRAM: Access Code: [email or web address] URL: [email or web address] Date: 03/05/2023 Prepared by: Loetta Ringer  Exercises - Seated Scapular Retraction  - 5 x daily - 7 x weekly -  1 sets - 10 reps - 5 hold - Supine Scapular Retraction  - 3 x daily - 7 x weekly - 1 sets - 10 reps - 5 hold - Seated Correct Posture  - 1 x daily - 7 x weekly - 3 sets - 10 reps - Seated Cervical Retraction  - 1 x daily - 7 x weekly - 3 sets - 10 reps - Supine Chin Tuck  - 1 x daily - 7 x weekly - 3 sets - 10 reps - Seated Cervical Sidebending AROM  - 3 x daily - 7 x weekly - 1 sets - 10 reps - 5-10 hold - Seated Assisted Cervical Rotation with Towel  - 5 x daily - 7 x weekly - 1 sets - 3 reps - 10 hold - Shoulder External Rotation and Scapular Retraction with Resistance  - 1 x daily - 7 x weekly - 2 sets - 10 reps - Standing Shoulder Row with Anchored Resistance  - 1 x daily - 7 x weekly - 2 sets - 10 reps - Shoulder extension with resistance - Neutral  - 1 x daily - 7 x weekly - 2 sets - 10 reps - Sit to Stand Without Arm Support  - 2 x daily - 7 x weekly - 2 sets - 5 reps  ASSESSMENT:  CLINICAL IMPRESSION: Today David [name]'s energy levels were not his normal due to him having a headache. He tolerated all his normal exercises without difficulty. PT monitored him throughout session and provided verbal cues for upright posture when necessary. He verbalized increased soreness of his upper trap muscles that responded well to manual  techniques. Patient would continue to benefit from skilled therapy to improve tissue mobility and postural strengthening.    OBJECTIVE IMPAIRMENTS: decreased activity tolerance, increased muscle spasms, impaired flexibility, postural dysfunction, and pain.   ACTIVITY LIMITATIONS: sitting, standing, and reach over head  PARTICIPATION LIMITATIONS: meal prep, driving, and community activity  PERSONAL FACTORS: 1-2 comorbidities: cancer on pallative care, chronic postural dysfunction are also affecting patient's functional outcome.   REHAB POTENTIAL: Good  CLINICAL DECISION MAKING: Stable/uncomplicated  EVALUATION COMPLEXITY: Moderate   GOALS: Goals reviewed with patient? Yes  SHORT TERM GOALS: Target date: 02/10/2023      Be independent in initial  HEP Baseline:  Goal status: MET  2.  Verbalize and demonstrate postural corrections to improve tolerance for neutral posture and reduced neck strain  Baseline:  Goal status: MET   LONG TERM GOALS: Target date:08/13/23    Be independent in advanced HEP Baseline:  Goal status: In progress   3.  Hold head upright for 3-5 minutes to allow for x-rays to get dentures  Baseline: flexed posture and ear touches shoulder  Goal status: NEW  3.   Walk to the store from home (10 min) without loss of balance due to improved upright posture   Baseline: stumbles frequently due to flexed head position Goal status:NEW   4.   Improve 6 min walk test to 1350 feet to improve community function  Baseline: 1225 feet (06/18/23) with RPE 5/10 Goal status: in progress    5.  Initiate a regular walking program and verbalize safe progression for endurance gains   Baseline: walking as able at home (04/23/23)   Goal status: in progress     PLAN:  PT FREQUENCY: 1x/week  PT DURATION: 6 weeks  PLANNED INTERVENTIONS: Therapeutic exercises, Therapeutic activity, Neuromuscular re-education, Balance training, Gait training, Patient/Family education,  Self Care, Joint mobilization, Aquatic Therapy,  Dry Needling, Electrical stimulation, Spinal mobilization, Cryotherapy, Moist heat, Taping, Traction, Manual therapy, and Re-evaluation  PLAN FOR NEXT SESSION: continue cervical alignment, mobility, posture and functional mobility. Endurance and functional tasks paired with upright posture   Penelope Bowie, PT 07/02/23 9:39 AM [facility] [address], [address] [place], [place] [address] Phone # [phone] Fax [phone]

## 2023-07-02 NOTE — Progress Notes (Signed)
 Radiation Oncology         [phone] ________________________________  Name: David [name] MRN: [id number]  Date: 07/02/2023  DOB: Aug 06, 1975  Follow-Up Visit Note  CC: [name], DO  [name], [name], DO  Diagnosis and Prior Radiotherapy:   C32.1     ICD-10-CM   1. Malignant neoplasm of supraglottis (HCC)  C32.1        Cancer Staging  Laryngeal cancer [facility]) Staging form: Larynx - Supraglottis, AJCC 8th Edition - Clinical stage from 12/01/2020: Stage IVB (cT3, cN3b, cM0) - Signed by Colie Dawes, MD on 12/20/2020 Stage prefix: Initial diagnosis   CHIEF COMPLAINT:  Here for follow-up and surveillance of laryngeal cancer  Narrative:   David [name] is here for a follow -up appointment today with [name].Completed radiation to the larynx on 02-09-2021.     NED per exam by [name] in Jan 2025  Pain issues, if any: Right sided neck pain, right sided neck muscles have started to become weak, has some difficulty holding head up right. Takes pregabalin  for anxiety. Using a feeding tube?: Feeding tube had been removed, has been eating soft foods Weight changes, if any: Patient has been eating sausage gravy biscuits, a lot of soft foods, Swallowing issues, if any: Yes, does have swallowing issues, food often comes out of nostrils Smoking or chewing tobacco? Patient does smoke   Last ENT visit was on: [name], MD January 2025 and [name], MD in August    ALLERGIES:  has no known allergies.  Meds: Current Outpatient Medications  Medication Sig Dispense Refill   apixaban  (ELIQUIS ) 5 MG TABS tablet TAKE 1 TABLET BY MOUTH TWICE A DAY 180 tablet 3   diazePAM  5 MG/5ML SOLN Take 3 mLs (3 mg total) by mouth every 8 (eight) hours as needed. TAKE 3 MLS (3 MG TOTAL) BY MOUTH EVERY 8 (EIGHT) HOURS AS NEEDED FOR MUSCLE SPASMS (DYSPHAGIA). 120 mL 0   fluconazole  (DIFLUCAN ) 200 MG tablet TAKE 4 TABLETS (800 MG TOTAL) BY MOUTH DAILY. 120 tablet 2   Glecaprevir -Pibrentasvir   100-40 MG TABS Take 1 tablet by mouth daily.     lidocaine  (XYLOCAINE ) 2 % solution Use as directed 15 mLs in the mouth or throat every 3 (three) hours as needed for mouth pain. Mix 1part 2% viscous lidocaine , 1part water . Swish and spit 30mL of total diluted mixture up to eight times a day, prn as needed for soreness 200 mL 2   midodrine  (PROAMATINE ) 5 MG tablet TAKE 1 TABLET (5 MG TOTAL) BY MOUTH 3 (THREE) TIMES DAILY WITH MEALS. 270 tablet 2   ondansetron  (ZOFRAN ) 4 MG tablet Take 1 tablet (4 mg total) by mouth every 8 (eight) hours as needed for nausea or vomiting. 30 tablet 1   Pregabalin  20 MG/ML SOLN Take 50 mg by mouth 3 (three) times daily. 160 mL 1   sertraline  (ZOLOFT ) 20 MG/ML concentrated solution Mix 1.25 mLs (25 mg total)  with 1/2 cup of water  and take by mouth daily. 60 mL 2   No current facility-administered medications for this encounter.    Physical Findings: The patient is in no acute distress. Patient is alert and oriented. Wt Readings from Last 3 Encounters:  07/02/23 99 lb 9.6 oz (45.2 kg)  05/27/23 105 lb 6.4 oz (47.8 kg)  04/15/23 105 lb (47.6 kg)    height is 5\' 11"  (1.803 m) and weight is 99 lb 9.6 oz (45.2 kg). His temperature is 97.5 F (36.4 C) (abnormal). His blood pressure  is 106/79 and his pulse is 58 (abnormal). His respiration is 20 and oxygen  saturation is 100%. .  General: Alert and oriented  HEENT: temporal wasting. Extraocular movements are intact. Oropharynx is notable for no tumor in upper throat.  Trach present.  Brittle teeth, most missing. Swelling in upper right eyelid, no redness Neck:  Neck strength is extremely poor, and head is tilted to right >>60 degrees. He needs to manually lift his head to turn it or straighten it.  No sign of recurrence but examination is limited Heart RRR Chest CTAB EXT: no edema MSK: Muscle wasting, ambulatory ECOG = 2  0 - Asymptomatic (Fully active, able to carry on all predisease activities without  restriction)  1 - Symptomatic but completely ambulatory (Restricted in physically strenuous activity but ambulatory and able to carry out work of a light or sedentary nature. For example, light housework, office work)  2 - Symptomatic, <50% in bed during the day (Ambulatory and capable of all self care but unable to carry out any work activities. Up and about more than 50% of waking hours)  3 - Symptomatic, >50% in bed, but not bedbound (Capable of only limited self-care, confined to bed or chair 50% or more of waking hours)  4 - Bedbound (Completely disabled. Cannot carry on any self-care. Totally confined to bed or chair)  5 - Death   Aurea Blossom MM, Creech RH, Tormey DC, et al. [phone]). "Toxicity and response criteria of the [facility]". Am. Hillard Lowes. Oncol. 5 (6): 649-55  Lab Findings: Lab Results  Component Value Date   WBC 4.8 12/13/2022   HGB 9.6 (L) 12/13/2022   HCT 31.2 (L) 12/13/2022   MCV 89.7 12/13/2022   PLT 108 (L) 12/13/2022    Lab Results  Component Value Date   TSH 6.419 (H) 12/20/2022        Latest Ref Rng & Units 12/13/2022    2:10 PM 11/29/2022   10:00 AM 11/13/2022    3:40 PM  BMP  Glucose 70 - 99 mg/dL 119  92  49   BUN 6 - 20 mg/dL 16  12  11    Creatinine 0.61 - 1.24 mg/dL 1.47  8.29  5.62   BUN/Creat Ratio 9 - 20   13   Sodium 135 - 145 mmol/L 135  135  138   Potassium 3.5 - 5.1 mmol/L 3.4  4.0  3.7   Chloride 98 - 111 mmol/L 101  96  97   CO2 22 - 32 mmol/L 28  26  24    Calcium 8.9 - 10.3 mg/dL 8.7  9.1  9.0     Radiographic Findings: No results found.  Impression/Plan:   1) Head and Neck Cancer Status:  Following closely now with ENT at [facility]. NED with chronic sequelae of cancer and treatments and poor nutrition  He is very debilitated and has multiple comorbidities and will continue to follow closely with palliative care.    2) Nutritional Status: He is adamant he doesn't want another PEG.  Does want dentures to enjoy food  more.  He states that he is not eligible for dentures until he can hold his head upright enough to get x-rays of his mouth.  These x-rays are important to facilitate dental extractions.  He is working with physical therapy to improve the mobility and strength in his neck.  We also talked about using Styrofoam wedges or physical therapy bands to hold his head upright during an x-ray in  the future.  He is going to discuss whether this is feasible when he and his mother reach out to his dental team Wt Readings from Last 3 Encounters:  07/02/23 99 lb 9.6 oz (45.2 kg)  05/27/23 105 lb 6.4 oz (47.8 kg)  04/15/23 105 lb (47.6 kg)     3) Risk Factors: The patient has been educated about risk factors including alcohol and tobacco abuse; they understand that avoidance of alcohol and tobacco is important to prevent recurrences as well as other cancers. Was smoking pack/day, still smoking at least a few cigarettes a day  4) Swallowing: He has worked with swallowing therapy in the past and is encouraged to continue swallowing exercises     5)  Thyroid  function:   lab results pending today Lab Results  Component Value Date   TSH 6.419 (H) 12/20/2022    6)  He continues to have social challenges and much of our encounters involve navigating the social challenges and brainstorming ways to improve his quality of life.  I think he would highly benefit from enrolling in our survivorship clinic.  I will refer him to see our survivorship specialist in 4 months and I will get involved in his care as needed moving forward.  I have personally sent a note to our survivorship specialist about his special needs.  The patient and his mom are enthusiastic about this plan.  They will continue to follow with otolaryngology for laryngoscopies.  On date of service, in total, I spent 40 minutes on this encounter. Patient was seen in person. _____________________________________   Colie Dawes, MD

## 2023-07-03 LAB — TSH: TSH: 5.59 u[IU]/mL — ABNORMAL HIGH (ref 0.350–4.500)

## 2023-07-09 ENCOUNTER — Ambulatory Visit: Payer: MEDICAID

## 2023-07-09 DIAGNOSIS — R293 Abnormal posture: Secondary | ICD-10-CM

## 2023-07-09 DIAGNOSIS — M542 Cervicalgia: Secondary | ICD-10-CM | POA: Diagnosis not present

## 2023-07-09 DIAGNOSIS — M6281 Muscle weakness (generalized): Secondary | ICD-10-CM

## 2023-07-09 NOTE — Therapy (Addendum)
 OUTPATIENT PHYSICAL THERAPY TREATMENT   Patient Name: David [name] MRN: [id number] DOB:[birth year], 48 y.o., male Today's Date: 07/09/2023     PT End of Session - 07/09/23 0840     Visit Number 20    Date for PT Re-Evaluation 08/13/23    Authorization Type 6 visits-06/27/2023-08/13/2023    Authorization - Visit Number 2    Authorization - Number of Visits 6    PT Start Time 0755    PT Stop Time 0840    PT Time Calculation (min) 45 min    Activity Tolerance Patient tolerated treatment well    Behavior During Therapy [facility] for tasks assessed/performed                  PT End of Session - 07/09/23 0840     Visit Number 20    Date for PT Re-Evaluation 08/13/23    Authorization Type 6 visits-06/27/2023-08/13/2023    Authorization - Visit Number 2    Authorization - Number of Visits 6    PT Start Time 0755    PT Stop Time 0840    PT Time Calculation (min) 45 min    Activity Tolerance Patient tolerated treatment well    Behavior During Therapy WFL for tasks assessed/performed                                 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)    Pneumococcal bacteremia 06/09/2021   Pneumothorax    Left lung spontaneous pneumothorax at age 74 yr    S/P percutaneous endoscopic gastrostomy (PEG) tube placement (HCC) 06/24/2021   Past Surgical History:  Procedure Laterality Date   chest tubes Left    IR GASTROSTOMY TUBE MOD SED  06/18/2021    LAPAROSCOPIC INSERTION GASTROSTOMY TUBE N/A 06/20/2021   Procedure: LAPAROSCOPIC INSERTION GASTROSTOMY TUBE;  Surgeon: Signe Mitzie LABOR, MD;  Location: [facility] OR;  Service: General;  Laterality: N/A;  DOW PATIENT   TRACHEOSTOMY TUBE PLACEMENT N/A 06/09/2021   Procedure: AWAKE TRACHEOSTOMY;  Surgeon: Carlie Clark, MD;  Location: [facility] OR;  Service: ENT;  Laterality: N/A;   Patient Active Problem List   Diagnosis Date Noted   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 04/03/2023   Difficulty urinating 03/27/2023   Subclinical hypothyroidism 02/10/2023   FTT (failure to thrive) in adult 12/31/2022   Dysphagia, unspecified 12/31/2022   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 12/31/2022   Hepatitis A immune 11/14/2022   End of life care 10/23/2022   Acute purulent bronchitis 10/23/2022   Hepatitis C 10/09/2022   Left leg pain 10/09/2022   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   History of pancytopenia 09/12/2022   Torticollis 07/04/2022   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   Defective dental restoration 12/31/2021   Chronic periodontitis 12/31/2021   Impacted third molar tooth 12/31/2021   Irritant contact dermatitis associated with digestive  stoma 12/25/2021   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   Continuous tobacco abuse 11/21/2021   Dermatitis associated with moisture 10/16/2021   Back pain 08/16/2021   Candidal endocarditis    History of radiation to head and neck region 07/11/2021   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   Dysgeusia 07/11/2021   Pressure injury of skin 06/17/2021   Tracheostomy status (HCC)    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   Heart failure with reduced ejection fraction (HCC) 04/28/2021   Anxiety 04/28/2021   Elevated troponin 04/01/2021   Elevated transaminase level 04/01/2021   Weight loss, unintentional 01/15/2021   Protein-calorie malnutrition, severe (HCC) 01/10/2021   Hypotension  01/08/2021   Chemotherapy induced nausea and vomiting 01/01/2021   Encounter for dental examination 12/13/2020   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   Teeth missing 12/04/2020   Radiation caries 12/04/2020   Retained tooth root 12/04/2020   Chronic apical periodontitis 12/04/2020   Accretions on teeth 12/04/2020    PCP: Gawaluck, [place], MD   REFERRING PROVIDER: [place] Fannie SAILOR, NP   REFERRING DIAG: Diagnosis Information  Diagnosis  C32.9 (ICD-10-CM) - Laryngeal cancer (HCC)    THERAPY DIAG:  Cervicalgia  Muscle weakness (generalized)  Abnormal posture  Rationale for Evaluation and Treatment: Rehabilitation  ONSET DATE: 1.5 year history (trach placement)  SUBJECTIVE:                                                                                                                                                                                                         SUBJECTIVE STATEMENT: I was able to get x-rays for my dentures.  I'm always tired but feel about the same.   From Eval: Pt presents to PT with Rt sided neck tension and shortened tissue secondary to radiation.  Pt has difficulty holding up his head due to pain, postural weakness, and shortened tissue on the Rt due to radiation.  Pt was discharged from PT in August due to clot in Rt jugular vein.  Clot is now resolved.  He reports that he has been stretching and working on posture at home.   Hand dominance: Right  PERTINENT HISTORY:  Tracheostomy, radiation to neck, malignant neoplasm of supraglottis, thrombosis of Rt internal jugular vein, torticollis, heart failure  PAIN: 07/09/2023 Are you having pain? Yes: NPRS scale: 0/10, Pain location: Rt neck Pain description: stiff, throbbing Aggravating factors: overstretching, trying to stretch  Relieving factors: massaging, medication  PRECAUTIONS: Other: cancer, trach  WEIGHT BEARING RESTRICTIONS: No  FALLS:  Has patient fallen in last 6 months?  No  LIVING ENVIRONMENT: Lives  with: lives alone Lives in: House/apartment   PLOF: Independent, walking to mailbox  PATIENT GOALS: reduce neck pain, improve mobility   NEXT MD VISIT:  January 2025  OBJECTIVE:   DIAGNOSTIC FINDINGS:  None   PATIENT SURVEYS:  06/18/23: 6 min walk test: 1225 feet with RPE 5/10 (2339 is age related norm)  04/23/23: 6 min walk test: 1217 feet (2339 is age related norm)  NDI 15/50 03/10/23: 6 min walk test: 1135 feet (age related norm in healthy male is 2339 ft)- RPE 5/10 The Patient-Specific Functional Scale (PSFS)  Initial:  I am going to ask you to identify up to 3 important activities that you are unable to do or are having difficulty with as a result of this problem.  Today are there any activities that you are unable to do or having difficulty with because of this?  (Patient shown scale and patient rated each activity)  Follow up: When you first came in you had difficulty performing these activities.  Today do you still have difficulty?  Patient-Specific activity scoring scheme (Point to one number):  0 1 2 3 4 5 6 7 8 9  10 Unable                                                                                                          Able to perform To perform                                                                                                    activity at the same Activity         Level as before                                                                                                                       Injury or problem  Activity  Tip head to take a drink  Initial:  5/10     (06/18/23)                follow up:    COGNITION: Overall cognitive status: Within functional limits for tasks assessed  SENSATION: WFL  POSTURE: rounded shoulders, forward head, increased thoracic kyphosis, and flexed trunk   PALPATION: Significant tension and reduced muscle length in bil neck.  Fixed cervical spine segments with minimal mobility allowed.    CERVICAL ROM:   Active ROM A/PROM (deg) 01/13/23  Flexion Rests in 30 degrees flexion  Extension   Right lateral flexion Rests in 30 degrees  Left lateral flexion 10 degrees Rt lateral flexion  Right rotation 50 degrees   Left rotation 25 degrees    (Blank rows = not tested)  UPPER EXTREMITY ROM:  UE A/ROM is WFLs without pain  UPPER EXTREMITY MMT: 4+/5 bil UE strength, neck strength 4-/5 throughout   TODAY'S TREATMENT:                                                                                                                              DATE:   07/09/23 NuStep: Level 4-arms and legs with cues to keep head upright x 8 minutes- PT present to discuss progress Seated row: 25# 2x10 Leg press: seat 7 65# 2x10 bil  Sit to stand with 5# kettlebell chest press 2x10 Shoulder flexion and scaption: 3# 2x10 bil each Wall clocks: yellow loop x5 each side  Farmer's carry with 5# kettlebell - 1 lap in each hand  Cervical sidebending isometrics 5 x10 and stretch into neutral with overpressure x3 Manual: IASTM to Lt and Rt neck in sitting   07/02/23 NuStep: Level 4-arms and legs with cues to keep head upright x 8 minutes- PT present to discuss progress Seated row: 25# 2x10 Leg press: seat 7 65# 2x10 bil  Sit to stand with 5# kettlebell chest press 2x10 Shoulder flexion and scaption: 3# 2x10 bil each Wall clocks: yellow loop x5 each side  Farmer's carry with 5# kettlebell - 1 lap in each hand  Cervical sidebending isometrics 5 x10 and stretch into neutral with overpressure x3 Manual: IASTM to Lt and Rt neck in sitting  06/25/23 NuStep: Level 4-arms and legs with cues to keep head upright x 8 minutes- PT present to discuss progress Seated row: 25# 2x10 Leg press: seat 7 55# 2x10 bil  Sit to stand with 5# kettlebell chest press 2x10 Shoulder flexion and scaption: 3# 2x10 bil each Wall clocks: yellow loop x5 each side   Walking in reverse: 10# with scap retraction x10 reps  Farmer's carry with 5# kettlebell - 1 lap in each hand  Cervical sidebending isometrics 5 x10 and stretch into neutral with overpressure x3 Manual: IASTM to Lt and Rt neck in sitting   PATIENT EDUCATION:  Education details: Access Code: [email or web address] Person educated: Patient Education method: Explanation, Demonstration, and Handouts Education comprehension: verbalized understanding and returned demonstration  HOME EXERCISE  PROGRAM: Access Code: [email or web address] URL: [email or web address] Date: 03/05/2023 Prepared by: Burnard  Exercises - Seated Scapular Retraction  - 5 x daily - 7 x weekly - 1 sets - 10 reps - 5 hold - Supine Scapular Retraction  - 3 x daily - 7 x weekly - 1 sets - 10 reps - 5 hold - Seated Correct Posture  - 1 x daily - 7 x weekly - 3 sets - 10 reps - Seated Cervical Retraction  - 1 x daily - 7 x weekly - 3 sets - 10 reps - Supine Chin Tuck  - 1 x daily - 7 x weekly - 3 sets - 10 reps - Seated Cervical Sidebending AROM  - 3 x daily - 7 x weekly - 1 sets - 10 reps - 5-10 hold - Seated Assisted Cervical Rotation with Towel  - 5 x daily - 7 x weekly - 1 sets - 3 reps - 10 hold - Shoulder External Rotation and Scapular Retraction with Resistance  - 1 x daily - 7 x weekly - 2 sets - 10 reps - Standing Shoulder Row with Anchored Resistance  - 1 x daily - 7 x weekly - 2 sets - 10 reps - Shoulder extension with resistance - Neutral  - 1 x daily - 7 x weekly - 2 sets - 10 reps - Sit to Stand Without Arm Support  - 2 x daily - 7 x weekly - 2 sets - 5 reps  ASSESSMENT:  CLINICAL IMPRESSION: Pt was able to hold his head in the upright position for x-rays needed for dentures.  Pt was fatigued today but was able to complete all exercises. PT monitored him throughout session and provided verbal cues for upright posture when necessary. He verbalized increased soreness of his upper trap muscles that responded well to manual  techniques. Patient would continue to benefit from skilled therapy to improve tissue mobility and postural strengthening.    OBJECTIVE IMPAIRMENTS: decreased activity tolerance, increased muscle spasms, impaired flexibility, postural dysfunction, and pain.   ACTIVITY LIMITATIONS: sitting, standing, and reach over head  PARTICIPATION LIMITATIONS: meal prep, driving, and community activity  PERSONAL FACTORS: 1-2 comorbidities: cancer on pallative care, chronic postural dysfunction are also affecting patient's functional outcome.   REHAB POTENTIAL: Good  CLINICAL DECISION MAKING: Stable/uncomplicated  EVALUATION COMPLEXITY: Moderate   GOALS: Goals reviewed with patient? Yes  SHORT TERM GOALS: Target date: 02/10/2023      Be independent in initial  HEP Baseline:  Goal status: MET  2.  Verbalize and demonstrate postural corrections to improve tolerance for neutral posture and reduced neck strain  Baseline:  Goal status: MET   LONG TERM GOALS: Target date:08/13/23    Be independent in advanced HEP Baseline:  Goal status: In progress   3.  Hold head upright for 3-5 minutes to allow for x-rays to get dentures  Baseline: flexed posture and ear touches shoulder  Goal status: NEW  3.   Walk to the store from home (10 min) without loss of balance due to improved upright posture   Baseline: stumbles frequently due to flexed head position Goal status:NEW   4.   Improve 6 min walk test to 1350 feet to improve community function  Baseline: 1225 feet (06/18/23) with RPE 5/10 Goal status: in progress    5.  Initiate a regular walking program and verbalize safe progression for endurance gains   Baseline: walking as able at home (04/23/23)   Goal status: in progress  PLAN:  PT FREQUENCY: 1x/week  PT DURATION: 6 weeks  PLANNED INTERVENTIONS: Therapeutic exercises, Therapeutic activity, Neuromuscular re-education, Balance training, Gait training, Patient/Family education,  Self Care, Joint mobilization, Aquatic Therapy, Dry Needling, Electrical stimulation, Spinal mobilization, Cryotherapy, Moist heat, Taping, Traction, Manual therapy, and Re-evaluation  PLAN FOR NEXT SESSION: continue cervical alignment, mobility, posture and functional mobility. Endurance and functional tasks paired with upright posture   Burnard Joy, PT 07/09/23 8:41 AM    PHYSICAL THERAPY DISCHARGE SUMMARY  Visits from Start of Care: 20  Current functional level related to goals / functional outcomes: See above.  Pt didn't return to PT after 07/09/23   Remaining deficits: See above for most current status.    Education / Equipment: HEP   Patient agrees to discharge. Patient goals were partially met. Patient is being discharged due to not returning since the last visit.  Burnard Joy, PT 09/08/23 12:14 PM   [facility] [address] [place], [place] [address] Phone # [phone] Fax [phone]

## 2023-07-15 ENCOUNTER — Inpatient Hospital Stay: Payer: MEDICAID | Attending: Hematology and Oncology | Admitting: Nurse Practitioner

## 2023-07-16 ENCOUNTER — Telehealth: Payer: Self-pay

## 2023-07-16 ENCOUNTER — Ambulatory Visit: Payer: MEDICAID

## 2023-07-16 NOTE — Telephone Encounter (Signed)
 PT called pt due to no-show today.  Left voicemail.

## 2023-07-20 ENCOUNTER — Other Ambulatory Visit: Payer: Self-pay | Admitting: Internal Medicine

## 2023-07-21 NOTE — Telephone Encounter (Signed)
 Medication sent to pharmacy

## 2023-07-23 ENCOUNTER — Inpatient Hospital Stay: Payer: MEDICAID | Attending: Hematology and Oncology | Admitting: Nurse Practitioner

## 2023-07-23 ENCOUNTER — Encounter: Payer: Self-pay | Admitting: Nurse Practitioner

## 2023-07-23 VITALS — BP 107/79 | HR 65 | Temp 98.1°F | Resp 18 | Wt 100.6 lb

## 2023-07-23 DIAGNOSIS — M62838 Other muscle spasm: Secondary | ICD-10-CM

## 2023-07-23 DIAGNOSIS — C329 Malignant neoplasm of larynx, unspecified: Secondary | ICD-10-CM

## 2023-07-23 DIAGNOSIS — Z515 Encounter for palliative care: Secondary | ICD-10-CM

## 2023-07-23 DIAGNOSIS — F419 Anxiety disorder, unspecified: Secondary | ICD-10-CM

## 2023-07-23 DIAGNOSIS — G893 Neoplasm related pain (acute) (chronic): Secondary | ICD-10-CM

## 2023-07-23 MED ORDER — PREGABALIN 20 MG/ML PO SOLN: 50.0000 mg | Freq: Three times a day (TID) | ORAL | 1 refills | Status: DC

## 2023-07-23 MED ORDER — NICOTINE 7 MG/24HR TD PT24: 7.0000 mg | MEDICATED_PATCH | Freq: Every day | TRANSDERMAL | 0 refills | Status: DC

## 2023-07-23 MED ORDER — DIAZEPAM 5 MG/5ML PO SOLN
3.0000 mg | Freq: Three times a day (TID) | ORAL | 0 refills | Status: DC | PRN
Start: 2023-07-23 — End: 2023-08-18

## 2023-07-23 NOTE — Progress Notes (Signed)
 Palliative Medicine [facility]  [phone] [phone]   Name: David [name] Date: 07/23/2023 MRN: [id number]  DOB: Nov 20, 1975  Patient Care Team: Kandis Perkins, DO as PCP - General (Internal Medicine) [name], Delon CROME, RN as Oncology Nurse Navigator [name], Gerard LABOR, DO as Consulting Physician (Otolaryngology) Izell Domino, MD as Consulting Physician (Radiation Oncology) Loretha Ash, MD as Consulting Physician (Hematology and Oncology) Pickenpack-Cousar, Fannie SAILOR, NP as Nurse Practitioner (Nurse Practitioner) Silvano Valrie SQUIBB, RN as Registered Nurse (Oncology)    INTERVAL HISTORY: David [name] is a 48 y.o. male with multiple medical problems including stage IV squamous cell carcinoma, right supraglottic mass, adenopathy, s/p initial concurrent chemoradiation, history of drug use currently followed by methadone  clinic, homelessness, and some malnutrition. He was recently hospitalized for several months at [facility] where he received treatment for fungal endocarditis and required emergent tracheostomy and PEG tube. During hospitalization CT of chest showed concerns for residual vs recurrent tumor. Palliative requested to re-engage for ongoing support and goals of care.   SOCIAL HISTORY:     reports that he has been smoking cigarettes. He has never used smokeless tobacco. He reports that he does not currently use alcohol after a past usage of about 12.0 standard drinks of alcohol per week. He reports that he does not currently use drugs after having used the following drugs: IV.  ADVANCE DIRECTIVES:  None on file   CODE STATUS: Full code  PAST MEDICAL HISTORY: Past Medical History:  Diagnosis Date   Acute respiratory failure with hypoxia (HCC)    AKI (acute kidney injury) (HCC) 01/08/2021   Aspiration pneumonia of right lower lobe (HCC) 06/06/2021   Bacterial endocarditis    Chronic HFrEF (heart failure  with reduced ejection fraction) (HCC) 06/09/2021   Community acquired pneumonia due to Pneumococcus (HCC) 04/01/2021   Evaluation by medical service required 03/19/2022   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HCC)    Leaking PEG tube (HCC) 12/12/2021   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)    Pneumococcal bacteremia 06/09/2021   Pneumothorax    Left lung spontaneous pneumothorax at age 34 yr    S/P percutaneous endoscopic gastrostomy (PEG) tube placement (HCC) 06/24/2021    ALLERGIES:  has no known allergies.  MEDICATIONS:  Current Outpatient Medications  Medication Sig Dispense Refill   apixaban  (ELIQUIS ) 5 MG TABS tablet TAKE 1 TABLET BY MOUTH TWICE A DAY 180 tablet 3   diazePAM  5 MG/5ML SOLN Take 3 mLs (3 mg total) by mouth every 8 (eight) hours as needed. TAKE 3 MLS (3 MG TOTAL) BY MOUTH EVERY 8 (EIGHT) HOURS AS NEEDED FOR MUSCLE SPASMS (DYSPHAGIA). 120 mL 0   fluconazole  (DIFLUCAN ) 200 MG tablet TAKE 4 TABLETS (800 MG TOTAL) BY MOUTH DAILY. 120 tablet 2   Glecaprevir -Pibrentasvir  100-40 MG TABS Take 1 tablet by mouth daily.     lidocaine  (XYLOCAINE ) 2 % solution Use as directed 15 mLs in the mouth or throat every 3 (three) hours as needed for mouth pain. Mix 1part 2% viscous lidocaine , 1part water . Swish and spit 30mL of total diluted mixture up to eight times a day, prn as needed for soreness 200 mL 2   midodrine  (PROAMATINE ) 5 MG tablet TAKE 1 TABLET (5 MG TOTAL) BY MOUTH 3 (THREE) TIMES DAILY WITH MEALS. 270 tablet 2   ondansetron  (ZOFRAN ) 4 MG tablet Take 1 tablet (4 mg total) by mouth every 8 (eight) hours as needed for nausea or vomiting. 30 tablet 1   [  START ON 07/31/2023] Pregabalin  20 MG/ML SOLN Take 50 mg by mouth 3 (three) times daily. 160 mL 1   sertraline  (ZOLOFT ) 20 MG/ML concentrated solution MIX 1.25 MLS (25 MG TOTAL) WITH 1/2 CUP OF WATER  AND TAKE BY MOUTH DAILY. 60 mL 2   No current facility-administered medications for this visit.    VITAL SIGNS: BP 107/79   Pulse  65   Temp 98.1 F (36.7 C)   Resp 18   Wt 100 lb 9.6 oz (45.6 kg)   BMI 14.03 kg/m  Filed Weights   07/23/23 1008  Weight: 100 lb 9.6 oz (45.6 kg)    Estimated body mass index is 14.03 kg/m as calculated from the following:   Height as of 07/02/23: 5' 11 (1.803 m).   Weight as of this encounter: 100 lb 9.6 oz (45.6 kg).   PERFORMANCE STATUS (ECOG) : 1 - Symptomatic but completely ambulatory  Physical Exam General: NAD Cardiovascular: regular rate and rhythm Pulmonary: normal breathing pattern, trach in place, Passy-Muir valve Extremities: Neck contracture Skin: no rashes Neurological: AAO x3  IMPRESSION: Discussed the use of AI scribe software for clinical note transcription with the patient, who gave verbal consent to proceed.  History of Present Illness David [name] is a 48 year old male with a history of tracheostomy and stage IV squamous cell carcinoma presents to clinic for symptom management follow-up.  No acute distress noted.  He is accompanied by his mother.  Patient is doing well overall.  Appetite is fair. Some days are better than others. Weight is stable at 100lbs.   He feels 'some type of way' and is experiencing increased sleepiness today. Reports not sleeping well and having some feelings of loneliness.  He is currently living in his apartment and states that things are going okay there since moving out from his mom. His mother tries to remain involved and goes over to get him out during the day.   David [name] shares his excitement in knowing he has been approved for his dentures. This request was previously denied by his insurance however now approved. He plans to have some dental work and extractions prior to fitting. He is excited to know this will allow him to eat more foods he enjoys.   He is taking diazepam  for anxiety and pregabalin  for chronic pain management. The pregabalin  is due for refill on July 3rd. He has been using Advil  PM for sleep,  taking one crushed tablet as needed. He has tried melatonin in the past, but it has not been effective. Expresses some difficulty with insomnia. Recommended additional trial of melatonin extra strength. No adjustments to medications at this time.   Patient shares with preparation of new dentures he is interested in trying to stop smoking cigarettes. Inquires about nicotine  patch use. Education provided.   No new symptoms of concern.  We will continue to closely follow and support as needed.  I discussed the importance of continued conversation with family and their medical providers regarding overall plan of care and treatment options, ensuring decisions are within the context of the patients values and GOCs. 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 Interested in starting a nonprofit for youth mentorship in rap. - Encourage pursuit of nonprofit initiative as therapeutic outlet.  Sleep disturbance Sleep disturbance is addressed with over-the-counter options. He has been advised to use Advil  PM and melatonin for sleep. Melatonin was previously ineffective. - Advise taking Advil  PM for sleep, with instructions to  crush the tablet if necessary. - Recommend starting melatonin at 5 mg.  Nicotine  dependence He is interested in trying nicotine  patches. - Prescribe nicotine  patches.  Follow-up Follow-up appointment scheduled to monitor progress and adjust treatment as necessary. - Schedule follow-up appointment on July 22nd at 1:30 PM.  Patient expressed understanding and was in agreement with this plan. He also understands that He can call the clinic at any time with any questions, concerns, or complaints.   Any controlled substances utilized were prescribed in the context of palliative care. PDMP has been reviewed.   Visit consisted of counseling and education dealing with the complex and emotionally intense issues of symptom management and palliative care  in the setting of serious and potentially life-threatening illness.  Levon Borer, AGPCNP-BC  Palliative Medicine Team/[facility]

## 2023-07-30 ENCOUNTER — Ambulatory Visit: Payer: MEDICAID

## 2023-08-06 ENCOUNTER — Ambulatory Visit: Payer: MEDICAID

## 2023-08-13 ENCOUNTER — Ambulatory Visit: Payer: MEDICAID | Attending: Radiation Oncology

## 2023-08-13 DIAGNOSIS — M542 Cervicalgia: Secondary | ICD-10-CM | POA: Insufficient documentation

## 2023-08-13 DIAGNOSIS — M6281 Muscle weakness (generalized): Secondary | ICD-10-CM | POA: Insufficient documentation

## 2023-08-13 DIAGNOSIS — R293 Abnormal posture: Secondary | ICD-10-CM | POA: Insufficient documentation

## 2023-08-18 ENCOUNTER — Other Ambulatory Visit: Payer: Self-pay | Admitting: Nurse Practitioner

## 2023-08-18 DIAGNOSIS — C329 Malignant neoplasm of larynx, unspecified: Secondary | ICD-10-CM

## 2023-08-18 DIAGNOSIS — F419 Anxiety disorder, unspecified: Secondary | ICD-10-CM

## 2023-08-18 DIAGNOSIS — M62838 Other muscle spasm: Secondary | ICD-10-CM

## 2023-08-18 DIAGNOSIS — Z515 Encounter for palliative care: Secondary | ICD-10-CM

## 2023-08-19 ENCOUNTER — Inpatient Hospital Stay: Payer: MEDICAID | Attending: Hematology and Oncology | Admitting: Nurse Practitioner

## 2023-08-19 ENCOUNTER — Encounter: Payer: Self-pay | Admitting: Nurse Practitioner

## 2023-08-19 VITALS — BP 116/77 | HR 88 | Temp 97.5°F | Resp 18 | Wt 92.7 lb

## 2023-08-19 DIAGNOSIS — G893 Neoplasm related pain (acute) (chronic): Secondary | ICD-10-CM

## 2023-08-19 DIAGNOSIS — Z515 Encounter for palliative care: Secondary | ICD-10-CM

## 2023-08-19 DIAGNOSIS — C329 Malignant neoplasm of larynx, unspecified: Secondary | ICD-10-CM

## 2023-08-19 DIAGNOSIS — F419 Anxiety disorder, unspecified: Secondary | ICD-10-CM | POA: Diagnosis not present

## 2023-08-19 MED ORDER — PREGABALIN 20 MG/ML PO SOLN: 50.0000 mg | Freq: Three times a day (TID) | ORAL | 2 refills | Status: DC

## 2023-08-19 NOTE — Progress Notes (Signed)
 Palliative Medicine [facility]  [phone] [phone]   Name: David [name] Date: 08/19/2023 MRN: [id number]  DOB: 08/05/1975  Patient Care Team: Kandis Perkins, DO as PCP - General (Internal Medicine) [name], Delon CROME, RN as Oncology Nurse Navigator [name], Gerard LABOR, DO as Consulting Physician (Otolaryngology) Izell Domino, MD as Consulting Physician (Radiation Oncology) Loretha Ash, MD as Consulting Physician (Hematology and Oncology) Pickenpack-Cousar, Fannie SAILOR, NP as Nurse Practitioner (Nurse Practitioner) Silvano Valrie SQUIBB, RN as Registered Nurse (Oncology)    INTERVAL HISTORY: David [name] is a 48 y.o. male with multiple medical problems including stage IV squamous cell carcinoma, right supraglottic mass, adenopathy, s/p initial concurrent chemoradiation, history of drug use currently followed by methadone  clinic, homelessness, and some malnutrition. He was recently hospitalized for several months at [facility] where he received treatment for fungal endocarditis and required emergent tracheostomy and PEG tube. During hospitalization CT of chest showed concerns for residual vs recurrent tumor. Palliative requested to re-engage for ongoing support and goals of care.   SOCIAL HISTORY:     reports that he has been smoking cigarettes. He has never used smokeless tobacco. He reports that he does not currently use alcohol after a past usage of about 12.0 standard drinks of alcohol per week. He reports that he does not currently use drugs after having used the following drugs: IV.  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  MEDICATIONS:  Current Outpatient Medications  Medication Sig Dispense Refill   apixaban  (ELIQUIS ) 5 MG TABS tablet TAKE 1 TABLET BY MOUTH TWICE A DAY 180 tablet 3   diazePAM  5 MG/5ML SOLN Take 3 mLs (3 mg total) by mouth every 8 (eight) hours as needed. TAKE 3 MLS (3 MG TOTAL) BY MOUTH EVERY 8 (EIGHT) HOURS AS NEEDED FOR MUSCLE SPASMS (DYSPHAGIA). 120 mL 0   fluconazole  (DIFLUCAN ) 200 MG tablet TAKE 4 TABLETS (800 MG TOTAL) BY MOUTH DAILY. 120 tablet 2   Glecaprevir -Pibrentasvir  100-40 MG TABS Take 1 tablet by mouth daily.     lidocaine  (XYLOCAINE ) 2 % solution Use as directed 15 mLs in the mouth or throat every 3 (three) hours as needed for mouth pain. Mix 1part 2% viscous lidocaine , 1part water . Swish and spit 30mL of total diluted mixture up to eight times a day, prn as needed for soreness 200 mL 2   midodrine  (PROAMATINE ) 5 MG tablet TAKE 1 TABLET (5 MG TOTAL) BY MOUTH 3 (THREE) TIMES DAILY WITH MEALS. 270 tablet 2   nicotine  (NICODERM CQ  - DOSED IN MG/24 HR) 7 mg/24hr patch PLACE 1 PATCH (7 MG TOTAL) ONTO THE SKIN DAILY. 28 patch 0  ondansetron  (ZOFRAN ) 4 MG tablet Take 1 tablet (4 mg total) by mouth every 8 (eight) hours as needed for nausea or vomiting. 30 tablet 1   Pregabalin  20 MG/ML SOLN Take 50 mg by mouth 3 (three) times daily. 160 mL 2   sertraline  (ZOLOFT ) 20 MG/ML concentrated solution MIX 1.25 MLS (25 MG TOTAL) WITH 1/2 CUP OF WATER  AND TAKE BY MOUTH DAILY. 60 mL 2    No current facility-administered medications for this visit.    VITAL SIGNS: BP 116/77   Pulse 88   Temp (!) 97.5 F (36.4 C)   Resp 18   Wt 92 lb 11.2 oz (42 kg)   SpO2 99%   BMI 12.93 kg/m  Filed Weights   08/19/23 1342  Weight: 92 lb 11.2 oz (42 kg)    Estimated body mass index is 12.93 kg/m as calculated from the following:   Height as of 07/02/23: 5' 11 (1.803 m).   Weight as of this encounter: 92 lb 11.2 oz (42 kg).   PERFORMANCE STATUS (ECOG) : 1 - Symptomatic but completely ambulatory  Physical Exam General: NAD Cardiovascular: regular rate and rhythm Pulmonary: normal breathing pattern, trach in place, Passy-Muir valve Extremities: Neck contracture Skin: no rashes Neurological: AAO x3  IMPRESSION: Discussed the use of AI scribe software for clinical note transcription with the patient, who gave verbal consent to proceed.  History of Present Illness David [name] is a 48 year old male with a history of tracheostomy and stage IV squamous cell carcinoma presents to clinic for symptom management follow-up.   He is accompanied by his mother.  Patient is doing well overall.  Appetite is fair. Some days are better than others. Weight is is down to 92lbs due to recent fall and inability to move around much he states this affected his appetite. Reports some improvement over past few days.   He experienced a fall at his apartment, resulting in a bruise on his forehead, bilateral elbows, and hip pain. He was alone at the time of the fall. His hip pain is improving daily, but he has difficulty with mobility due to the fall and hip pain, although it is becoming easier.   He is currently undergoing therapy and has an upcoming appointment for dental surgery scheduled for October 21, 2023. There are concerns about his ability to undergo anesthesia for the procedure due to pulmonary issues. Encouraged patient to contact his Pulmonary team for clearance.    He  is taking diazepam  for anxiety and pregabalin . Regimen effective.  He has been using Advil  PM for sleep, taking one crushed tablet as needed. He has tried melatonin in the past, but it has not been effective. Expresses some difficulty with insomnia. Recommended additional trial of melatonin extra strength. No adjustments to medications at this time.   No new symptoms of concern.  We will continue to closely follow and support as needed.  I discussed the importance of continued conversation with family and their medical providers regarding overall plan of care and treatment options, ensuring decisions are within the context of the patients values and GOCs. Assessment & Plan Fall with head and hip injury Recent fall resulting in head and hip injury with bruising on forehead and pain in hip. Difficulty walking post-fall. Improvement in symptoms reported, with hip pain decreasing and mobility improving. No acute distress. - Advise use of crutch as needed for support - Ensure pregabalin  is picked up today for pain management - Ensure diazepam  is picked  up on July 24th for anxiety management - Continue with scheduled therapy sessions  Pre-operative assessment for dental surgery Scheduled for dental surgery on September 23rd. Requires assessment by pulmonology team to determine fitness for anesthesia due to potential respiratory concerns. - Contact pulmonology team to assess fitness for anesthesia for upcoming surgery on September 23rd  Follow-up Follow-up appointment scheduled to monitor progress and adjust treatment as necessary. - Schedule follow-up appointment  in 6 weeks. Sooner if needed.   Patient expressed understanding and was in agreement with this plan. He also understands that He can call the clinic at any time with any questions, concerns, or complaints.   Any controlled substances utilized were prescribed in the context of palliative care. PDMP has been reviewed.   Visit consisted of  counseling and education dealing with the complex and emotionally intense issues of symptom management and palliative care in the setting of serious and potentially life-threatening illness.  Levon Borer, AGPCNP-BC  Palliative Medicine Team/[facility]

## 2023-08-20 ENCOUNTER — Telehealth: Payer: Self-pay | Admitting: Nurse Practitioner

## 2023-08-20 NOTE — Telephone Encounter (Signed)
 Called patient unable to leave patient vm mailbox full

## 2023-08-21 ENCOUNTER — Telehealth: Payer: Self-pay

## 2023-08-21 NOTE — Telephone Encounter (Signed)
 Pt mother called to request one day early pick up of pt diazepam  d/t lack of transportation. RN called and allowed early pick up per Fannie borer, NP

## 2023-08-26 ENCOUNTER — Inpatient Hospital Stay (HOSPITAL_COMMUNITY)
Admission: RE | Admit: 2023-08-26 | Discharge: 2023-08-26 | Disposition: A | Discharge status: Home / Self Care | Payer: MEDICAID | Source: Ambulatory Visit

## 2023-08-26 NOTE — Progress Notes (Incomplete)
 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 Has had slow but steady overall decline over the summer. Specifically significant adult FTT w/ECOG 3. He remains hospice appropriate in my opinion. Still very debilitated, malnourished, his neck contraction has not improved. I see he had wanted referral for botox in effort to help w/ muscle spacticity but deemed not a candidate d/t prior XRT. He continues to follow w/ outpt rehab   Review of Systems  All other systems reviewed and are negative.  1) RISK FOR PROLONGED MECHANICAL VENTILAION - > 48h  1A) Arozullah - Prolonged mech ventilation risk Arozullah Postperative Pulmonary Risk Score - for mech ventilation dependence >48h USAA, Ann Surg 2000, major non-cardiac surgery) Comment Score  Type of surgery - abd ao aneurysm (27), thoracic (21), neurosurgery / upper abdominal / vascular (21), neck (11) dental 0  Emergency Surgery - (11) scheduled 0  ALbumin < 3 or poor nutritional state - (9) 3.10 0  BUN > 30 -  (8) 12 0  Partial or completely dependent functional status - (7) Weak/deconditioned 7  COPD -  (6) No  0  Age - 60 to 69 (4), > 70  (6) 48 0  TOTAL  7  Risk Stratifcation scores  - < 10 (0.5%), 11-19 (1.8%), 20-27 (4.2%), 28-40 (10.1%), >40 (26.6%) 7 (0.5% risk)       1B) GUPTA - Prolonged Mech Vent Risk Score source Risk  Guptal post op prolonged mech ventilation > 48h or reintubation < 30 days - ACS 2007-2008 dataset - [employer] ASA class 2. With <1.5% risk of need for prolonged mechanical ventilation or need for ventilatory assist <30d prior     2) RISK FOR POST OP PNEUMONIA Score source Risk  Charlanne - Post Op Pnemounia risk  [employer] 0.6% risk of pneumonia     R3) ISK FOR ANY POST-OP PULMONARY COMPLICATION Score source Risk   CANET/ARISCAT Score - risk for ANY/ALl pulmonary complications - > risk of in-hospital post-op pulmonary complications (composite including respiratory failure, respiratory infection, pleural effusion, atelectasis, pneumothorax, bronchospasm, aspiration pneumonitis) [employer]es - based on age, anemia, pulse ox, resp infection prior 30d, incision site, duration of surgery, and emergency v elective surgery   Low risk 1.6% risk of in-hospital post-op pulmonary complications (composite including respiratory failure, respiratory infection, pleural effusion, atelectasis, pneumothorax, bronchospasm, aspiration pneumonitis     Exam    Procedure:   Active Problems:   Tracheostomy status (HCC)   Subglottic stenosis   Laryngeal cancer (HCC)   Torticollis    There are no diagnoses linked to this encounter.    My time 16 min    Maude FORBES Banner ACNP-BC [facility] Pulmonary/Critical Care Pager # [phone] OR # [phone] if no answer

## 2023-08-28 IMAGING — CT CT NECK W/ CM
4 series · 15 of 33 positions shown, 18 images · IV contrast (omnipaque)
Comparison: None.

CLINICAL DATA: Neck mass, initial workup

EXAM:
CT NECK WITH CONTRAST
TECHNIQUE: Multidetector CT imaging of the neck was performed using the
standard protocol following the bolus administration of intravenous
contrast.
CONTRAST:  75mL OMNIPAQUE IOHEXOL 300 MG/ML  SOLN

[Series 3: axial neck · axial · 0.62mm/px · z∈[-314,-102]mm · 5 of 160 slices shown, 7 images]
[im 27/160  soft-tissue]
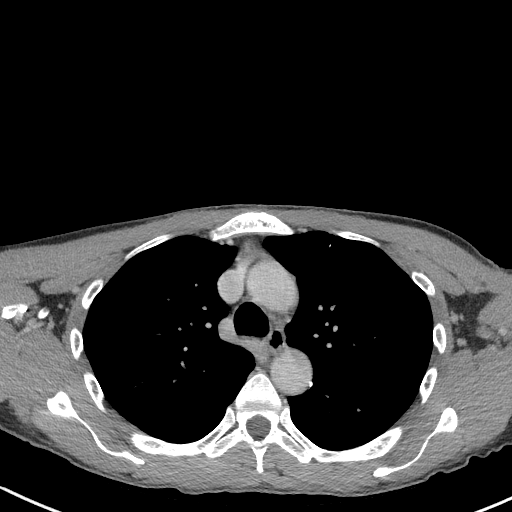
[im 27/160  bone]
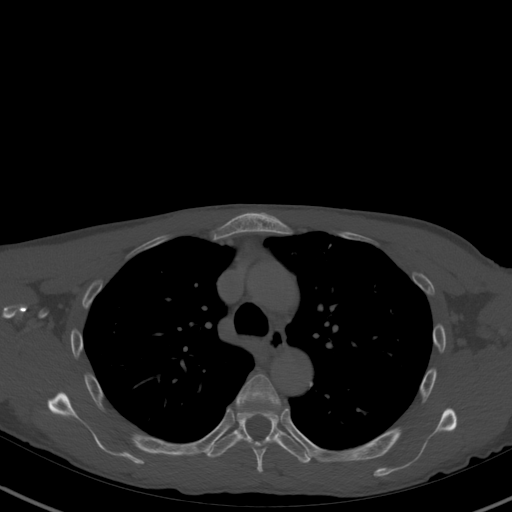
[im 54/160  bone]
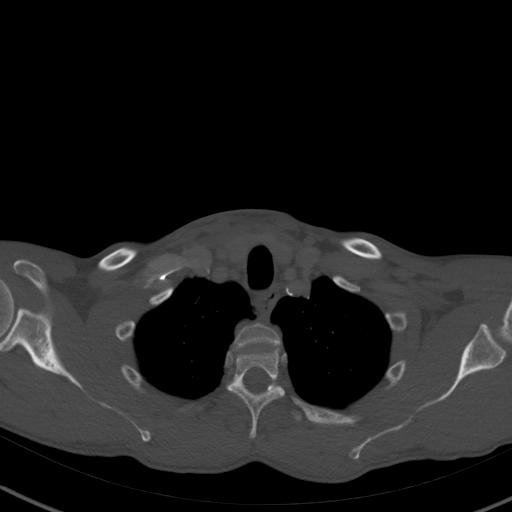
[im 80/160  bone]
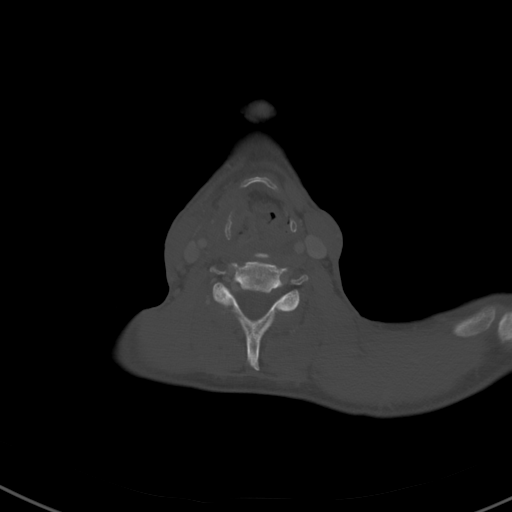
[im 107/160  bone]
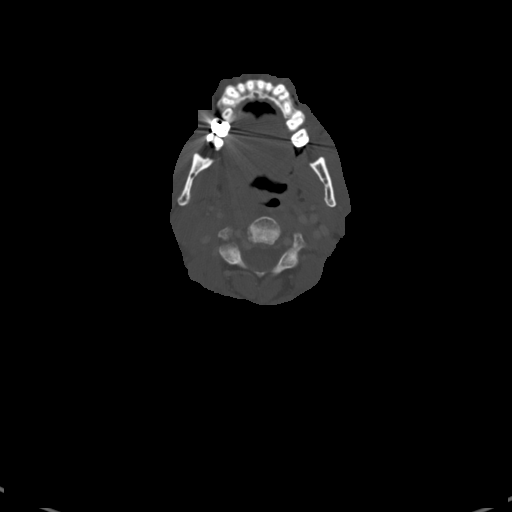
[im 133/160  soft-tissue]
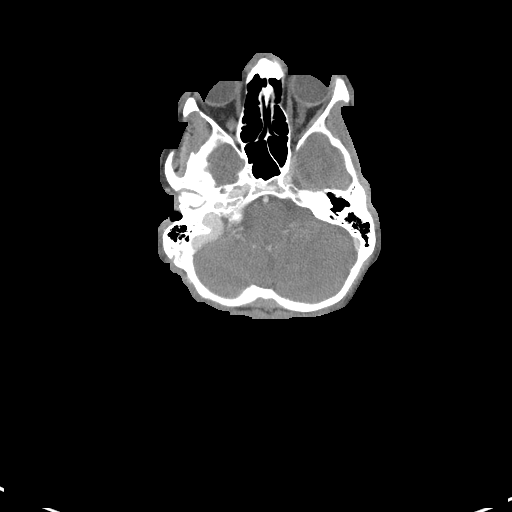
[im 133/160  bone]
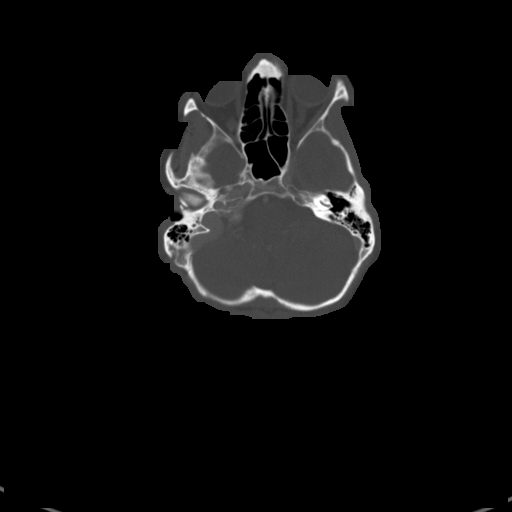

[Series 6: sag neck · sagittal · 0.61mm/px · 5 of 141 slices shown, 6 images]
[im 47/141  bone]
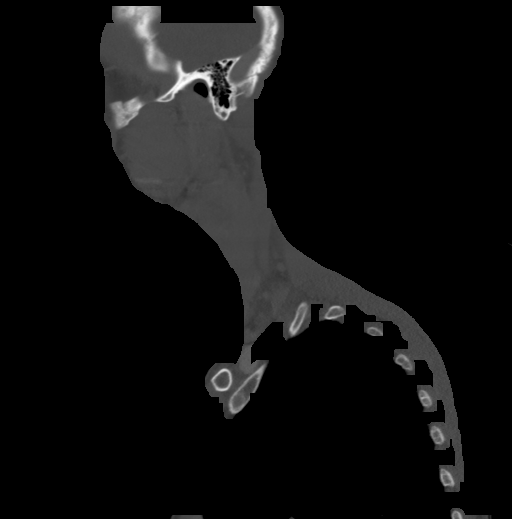
[im 59/141  bone]
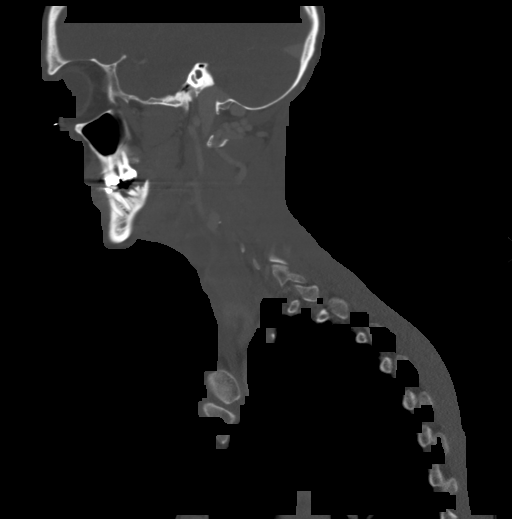
[im 71/141  soft-tissue]
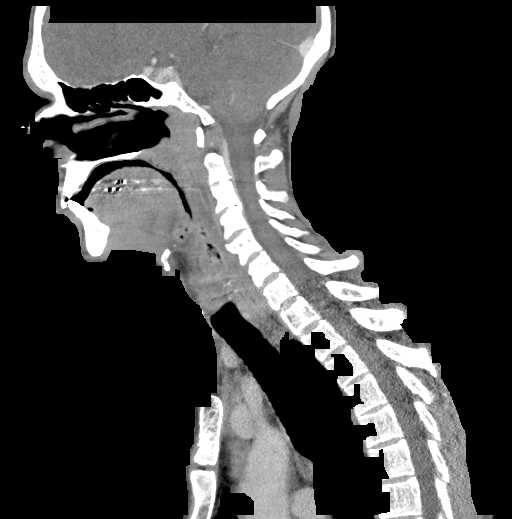
[im 71/141  bone]
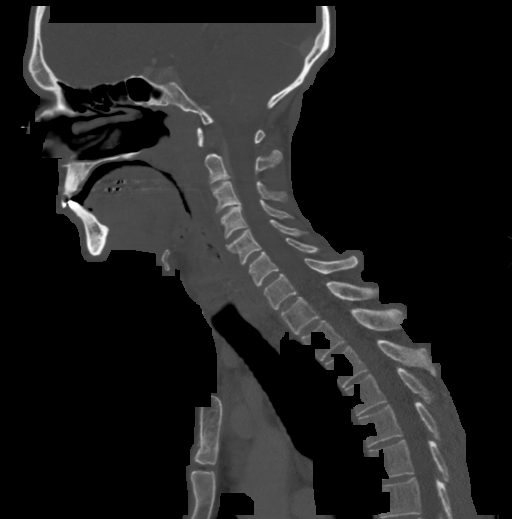
[im 82/141  bone]
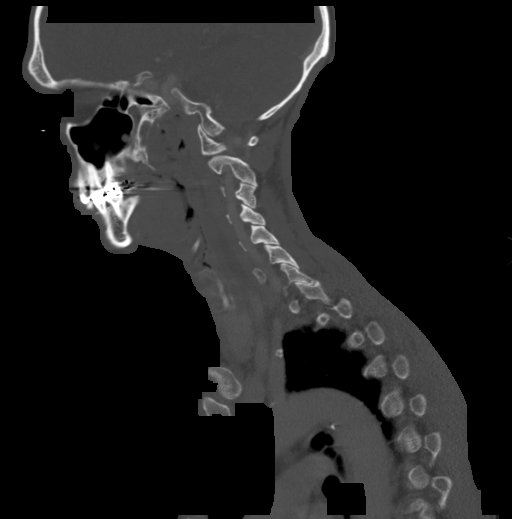
[im 94/141  bone]
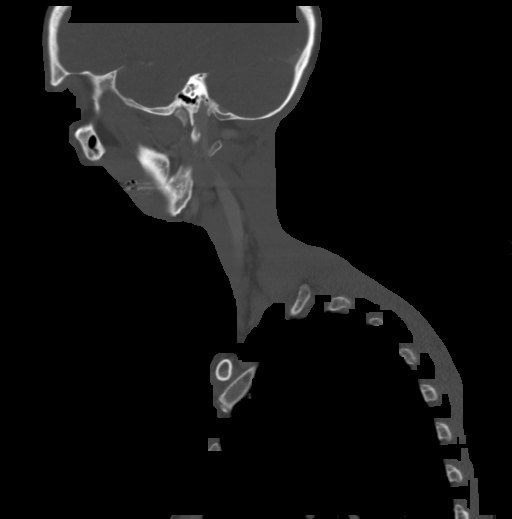

[Series 7: cor neck · coronal · 0.62mm/px · 3 of 158 slices shown]
[im 32/158  bone]
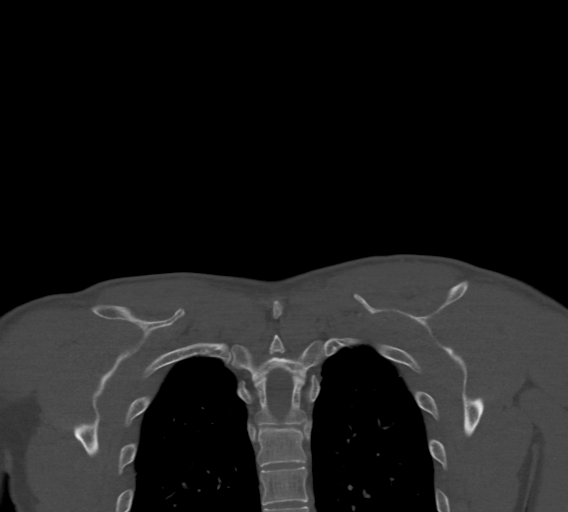
[im 63/158  bone]
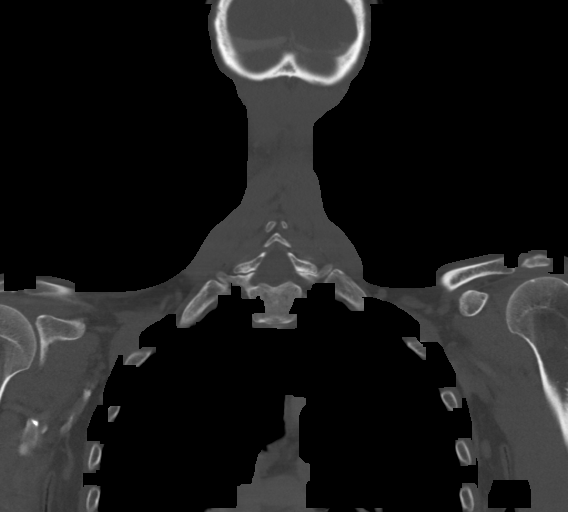
[im 95/158  bone]
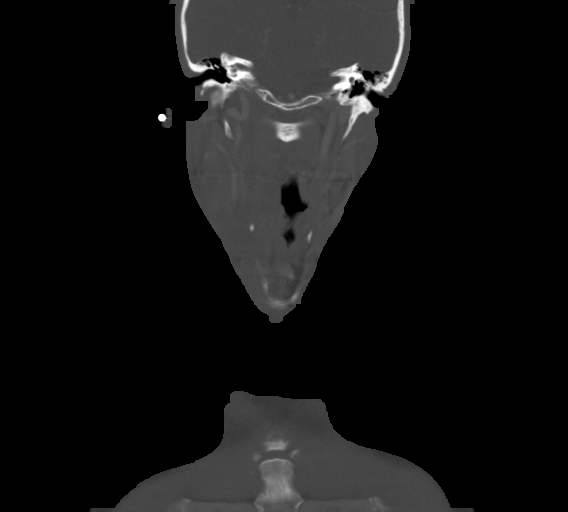

[Series 8: ax oropharynx · axial · 0.57mm/px · z∈[-315,-261]mm · 2 of 160 slices shown]
[im 27/160  bone]
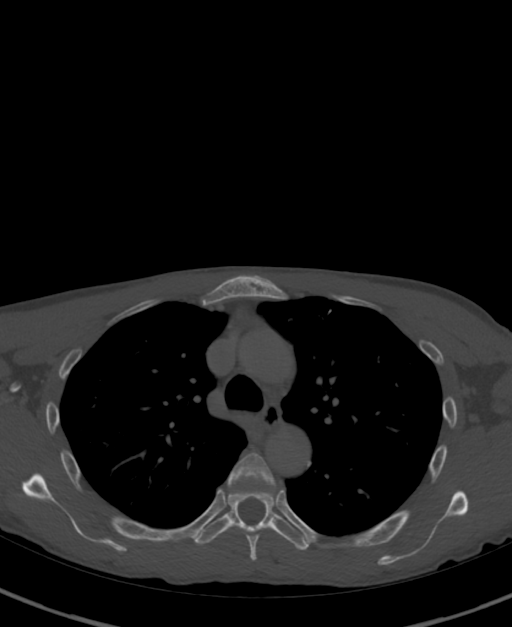
[im 54/160  bone]
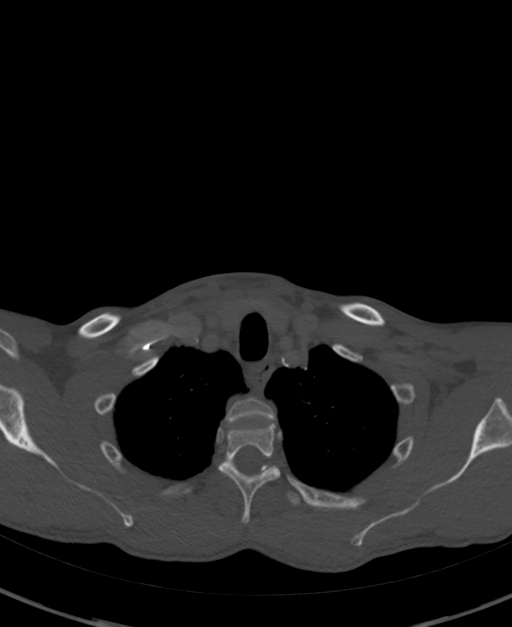

[15 of 33 positions shown; findings below may reference images not displayed]

FINDINGS: Pharynx and larynx: Nasopharynx is unremarkable. There is asymmetric
soft tissue thickening the right lateral wall of the oropharynx.
There is involvement of the walls of the oropharynx. Thickening of
the aryepiglottic folds. Possible involvement of the epiglottis
though lingual surface is likely spared. Piriform sinuses are
effaced. Asymmetric supraglottic thickening on the right to the
level of laryngeal vestibule. Suspect extension to level of right
vocal cords. Significant airway narrowing at level of lower
oropharynx and hypopharynx. Involvement of thyroid and laryngeal
cartilages is difficult to exclude due to motion. Probable small
volume retropharyngeal fluid.

Salivary glands: Parotid and submandibular glands

Thyroid: Normal.

Lymph nodes: Right upper cervical adenopathy. Conglomerate right
level 2 node measures 2.4 cm. There is also probable right
retropharyngeal adenopathy with limited measurement (approximately
1.4 cm) on series 3, image 45.

Vascular: Effacement of right internal jugular vein along
adenopathy. Mild calcified plaque at carotid bifurcations.

Limited intracranial: No abnormal enhancement.

Visualized orbits: Unremarkable.

Mastoids and visualized paranasal sinuses: No significant
opacification.

Skeleton: No significant osseous abnormality.

Upper chest: Mild emphysema.
IMPRESSION: Oropharynx, hypopharynx, and supraglottic larynx malignancy. Suspect
extension to level of vocal cords on right. Significant airway
narrowing at level of lower oropharynx and hypopharynx.

Metastatic adenopathy on the right including conglomerate right
level 2 node.

## 2023-09-15 ENCOUNTER — Other Ambulatory Visit: Payer: Self-pay

## 2023-09-15 ENCOUNTER — Emergency Department (HOSPITAL_COMMUNITY): Payer: MEDICAID

## 2023-09-15 ENCOUNTER — Inpatient Hospital Stay (HOSPITAL_COMMUNITY)
Admission: EM | Admit: 2023-09-15 | Discharge: 2023-09-19 | DRG: 521 | Disposition: A | Discharge status: Home / Self Care | Payer: MEDICAID | Attending: Internal Medicine | Admitting: Internal Medicine

## 2023-09-15 DIAGNOSIS — Z9221 Personal history of antineoplastic chemotherapy: Secondary | ICD-10-CM

## 2023-09-15 DIAGNOSIS — Z8589 Personal history of malignant neoplasm of other organs and systems: Secondary | ICD-10-CM | POA: Diagnosis not present

## 2023-09-15 DIAGNOSIS — Z7401 Bed confinement status: Secondary | ICD-10-CM | POA: Diagnosis not present

## 2023-09-15 DIAGNOSIS — I5022 Chronic systolic (congestive) heart failure: Secondary | ICD-10-CM | POA: Diagnosis present

## 2023-09-15 DIAGNOSIS — W010XXA Fall on same level from slipping, tripping and stumbling without subsequent striking against object, initial encounter: Secondary | ICD-10-CM | POA: Diagnosis present

## 2023-09-15 DIAGNOSIS — Z8619 Personal history of other infectious and parasitic diseases: Secondary | ICD-10-CM | POA: Diagnosis not present

## 2023-09-15 DIAGNOSIS — E46 Unspecified protein-calorie malnutrition: Secondary | ICD-10-CM | POA: Diagnosis not present

## 2023-09-15 DIAGNOSIS — G894 Chronic pain syndrome: Secondary | ICD-10-CM | POA: Diagnosis present

## 2023-09-15 DIAGNOSIS — F419 Anxiety disorder, unspecified: Secondary | ICD-10-CM | POA: Diagnosis present

## 2023-09-15 DIAGNOSIS — F32A Depression, unspecified: Secondary | ICD-10-CM | POA: Diagnosis present

## 2023-09-15 DIAGNOSIS — Z79891 Long term (current) use of opiate analgesic: Secondary | ICD-10-CM

## 2023-09-15 DIAGNOSIS — S7291XA Unspecified fracture of right femur, initial encounter for closed fracture: Secondary | ICD-10-CM | POA: Diagnosis present

## 2023-09-15 DIAGNOSIS — F17210 Nicotine dependence, cigarettes, uncomplicated: Secondary | ICD-10-CM | POA: Diagnosis present

## 2023-09-15 DIAGNOSIS — Z931 Gastrostomy status: Secondary | ICD-10-CM

## 2023-09-15 DIAGNOSIS — Z8679 Personal history of other diseases of the circulatory system: Secondary | ICD-10-CM | POA: Diagnosis not present

## 2023-09-15 DIAGNOSIS — R627 Adult failure to thrive: Secondary | ICD-10-CM | POA: Diagnosis present

## 2023-09-15 DIAGNOSIS — Z8521 Personal history of malignant neoplasm of larynx: Secondary | ICD-10-CM | POA: Diagnosis not present

## 2023-09-15 DIAGNOSIS — E43 Unspecified severe protein-calorie malnutrition: Secondary | ICD-10-CM | POA: Diagnosis present

## 2023-09-15 DIAGNOSIS — Z930 Tracheostomy status: Secondary | ICD-10-CM | POA: Diagnosis not present

## 2023-09-15 DIAGNOSIS — M80051A Age-related osteoporosis with current pathological fracture, right femur, initial encounter for fracture: Principal | ICD-10-CM | POA: Diagnosis present

## 2023-09-15 DIAGNOSIS — R1310 Dysphagia, unspecified: Secondary | ICD-10-CM | POA: Diagnosis present

## 2023-09-15 DIAGNOSIS — Z923 Personal history of irradiation: Secondary | ICD-10-CM

## 2023-09-15 DIAGNOSIS — M25551 Pain in right hip: Secondary | ICD-10-CM | POA: Diagnosis present

## 2023-09-15 DIAGNOSIS — Z681 Body mass index (BMI) 19 or less, adult: Secondary | ICD-10-CM

## 2023-09-15 DIAGNOSIS — Y9301 Activity, walking, marching and hiking: Secondary | ICD-10-CM | POA: Diagnosis present

## 2023-09-15 DIAGNOSIS — S72011A Unspecified intracapsular fracture of right femur, initial encounter for closed fracture: Secondary | ICD-10-CM | POA: Diagnosis present

## 2023-09-15 DIAGNOSIS — R54 Age-related physical debility: Secondary | ICD-10-CM | POA: Diagnosis present

## 2023-09-15 DIAGNOSIS — D62 Acute posthemorrhagic anemia: Secondary | ICD-10-CM | POA: Diagnosis not present

## 2023-09-15 DIAGNOSIS — M436 Torticollis: Secondary | ICD-10-CM | POA: Diagnosis not present

## 2023-09-15 DIAGNOSIS — Z7901 Long term (current) use of anticoagulants: Secondary | ICD-10-CM | POA: Diagnosis not present

## 2023-09-15 DIAGNOSIS — Z79899 Other long term (current) drug therapy: Secondary | ICD-10-CM

## 2023-09-15 DIAGNOSIS — R001 Bradycardia, unspecified: Secondary | ICD-10-CM | POA: Diagnosis not present

## 2023-09-15 DIAGNOSIS — Z7952 Long term (current) use of systemic steroids: Secondary | ICD-10-CM

## 2023-09-15 DIAGNOSIS — S72001A Fracture of unspecified part of neck of right femur, initial encounter for closed fracture: Secondary | ICD-10-CM | POA: Diagnosis present

## 2023-09-15 DIAGNOSIS — Z86718 Personal history of other venous thrombosis and embolism: Secondary | ICD-10-CM

## 2023-09-15 LAB — BASIC METABOLIC PANEL WITH GFR
Anion gap: 10 (ref 5–15)
BUN: 22 mg/dL — ABNORMAL HIGH (ref 6–20)
CO2: 29 mmol/L (ref 22–32)
Calcium: 9.6 mg/dL (ref 8.9–10.3)
Chloride: 101 mmol/L (ref 98–111)
Creatinine, Ser: 0.62 mg/dL (ref 0.61–1.24)
GFR, Estimated: >60 mL/min (ref >60)
Glucose, Bld: 64 mg/dL — ABNORMAL LOW (ref 70–99)
Potassium: 4.4 mmol/L (ref 3.5–5.1)
Sodium: 140 mmol/L (ref 135–145)

## 2023-09-15 LAB — CBC WITH DIFFERENTIAL/PLATELET
Abs Immature Granulocytes: 0.02 K/uL (ref 0.00–0.07)
Basophils Absolute: 0.1 K/uL (ref 0.0–0.1)
Basophils Relative: 1 %
Eosinophils Absolute: 0 K/uL (ref 0.0–0.5)
Eosinophils Relative: 0 %
HCT: 38.8 % — ABNORMAL LOW (ref 39.0–52.0)
Hemoglobin: 11.5 g/dL — ABNORMAL LOW (ref 13.0–17.0)
Immature Granulocytes: 0 %
Lymphocytes Relative: 6 %
Lymphs Abs: 0.4 K/uL — ABNORMAL LOW (ref 0.7–4.0)
MCH: 26.7 pg (ref 26.0–34.0)
MCHC: 29.6 g/dL — ABNORMAL LOW (ref 30.0–36.0)
MCV: 90 fL (ref 80.0–100.0)
Monocytes Absolute: 0.5 K/uL (ref 0.1–1.0)
Monocytes Relative: 7 %
Neutro Abs: 5.7 K/uL (ref 1.7–7.7)
Neutrophils Relative %: 86 %
Platelets: 322 K/uL (ref 150–400)
RBC: 4.31 MIL/uL (ref 4.22–5.81)
RDW: 15.7 % — ABNORMAL HIGH (ref 11.5–15.5)
WBC: 6.7 K/uL (ref 4.0–10.5)
nRBC: 0 % (ref 0.0–0.2)

## 2023-09-15 IMAGING — US IR BIOPSY CORE MUSCLE/SOFT TISSUE
1 series · 9 of 9 positions shown · non-contrast
Comparison: none

INDICATION: 45-year-old male with a right neck mass, CT imaging demonstrating
head neck carcinoma

[Series 1: us core biopsy (soft tissue) · 9 of 9 slices shown]
[im 1/9]
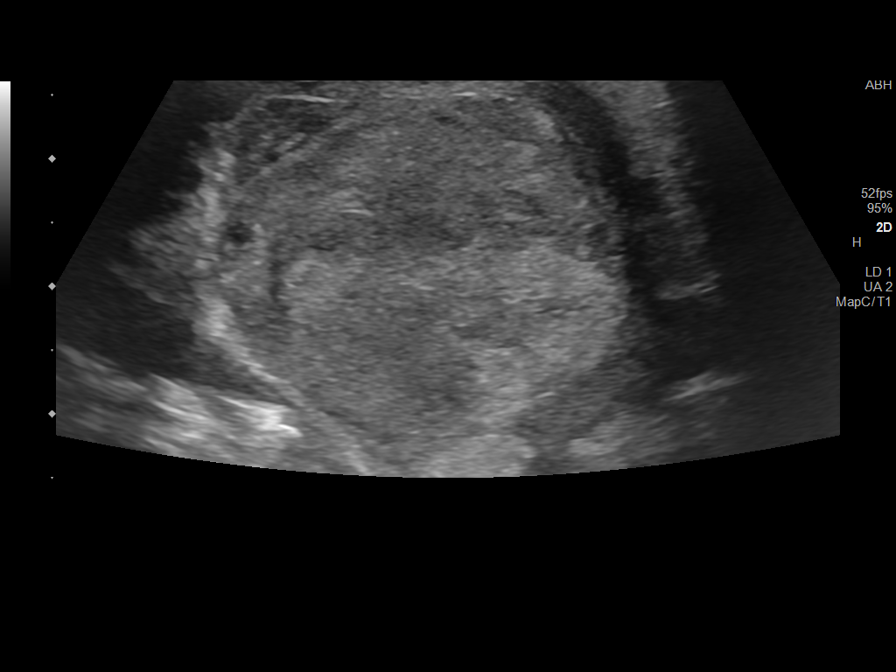
[im 2/9]
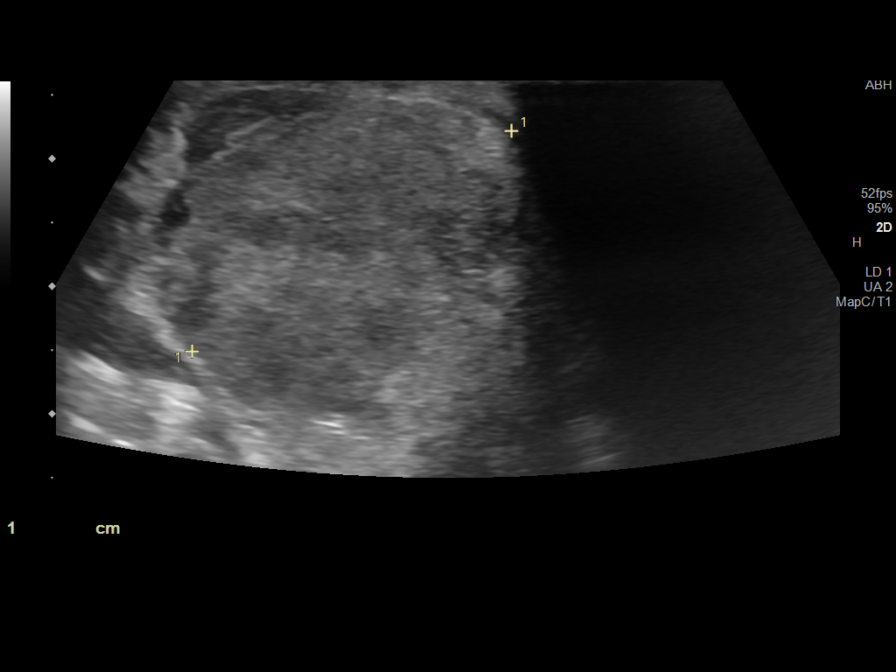
[im 3/9]
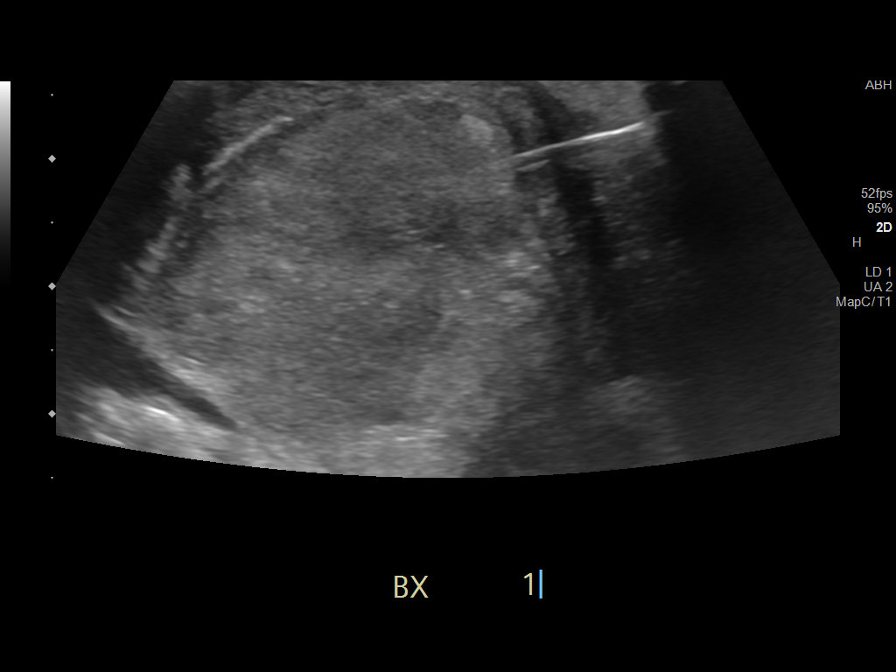
[im 4/9]
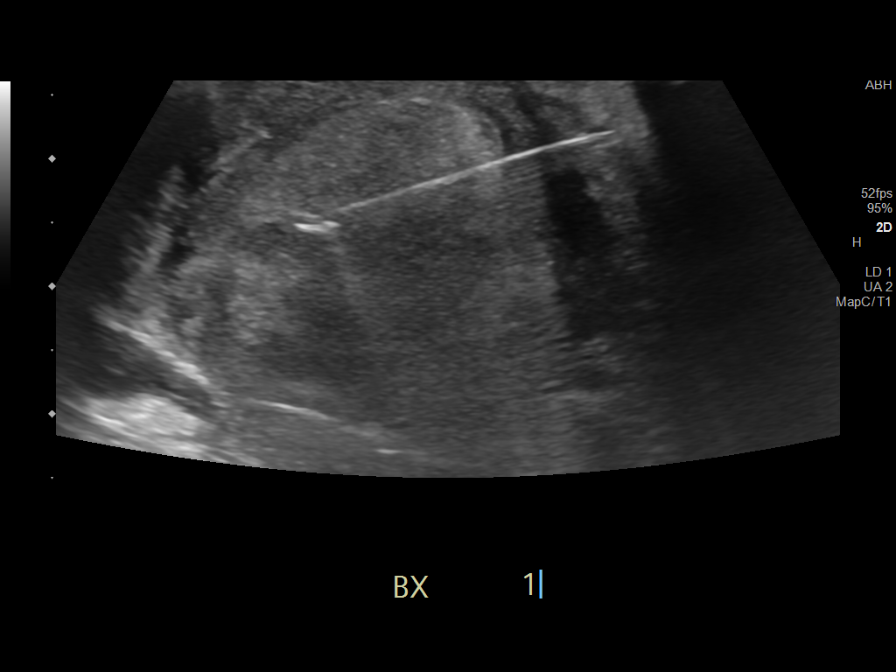
[im 5/9]
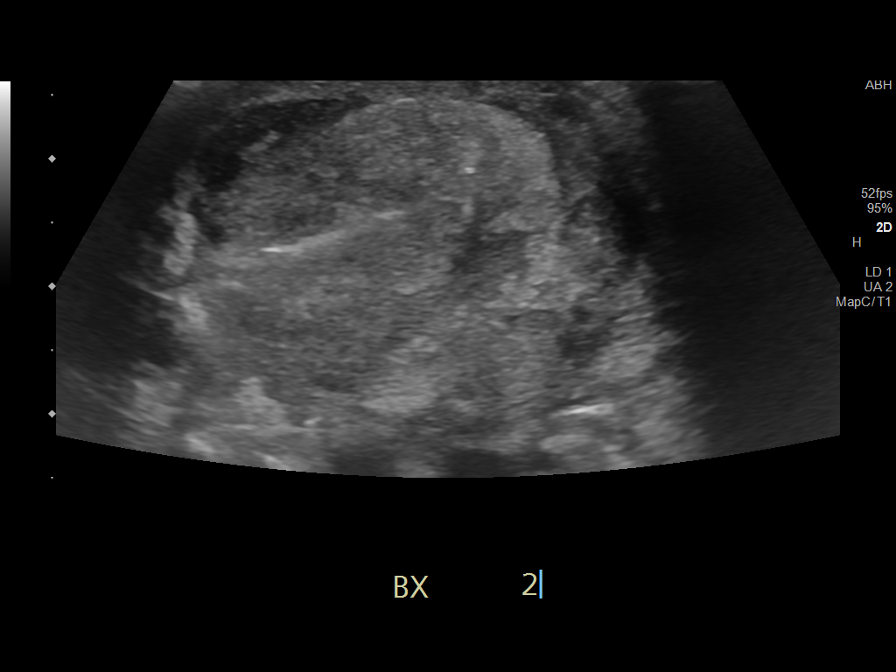
[im 6/9]
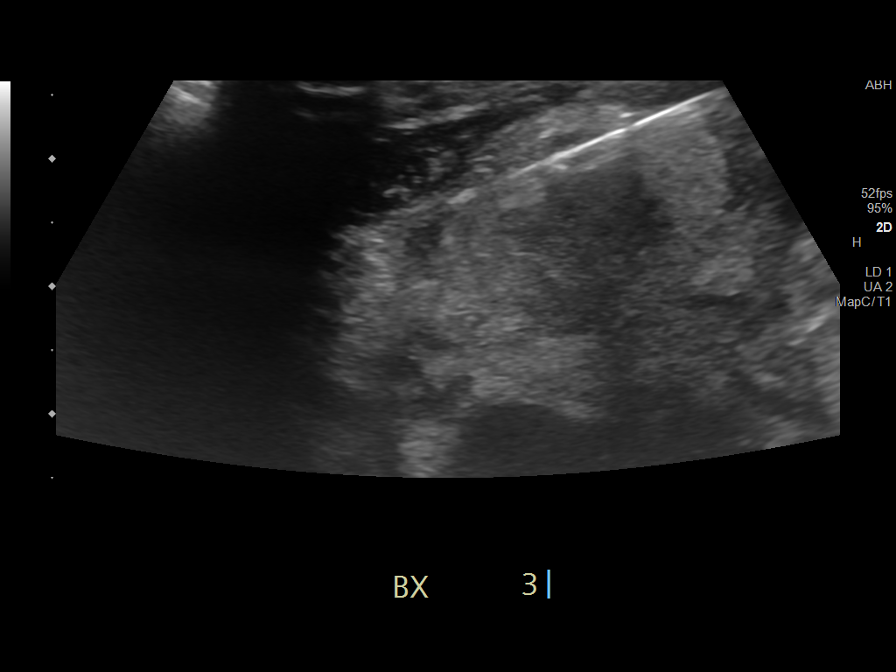
[im 7/9]
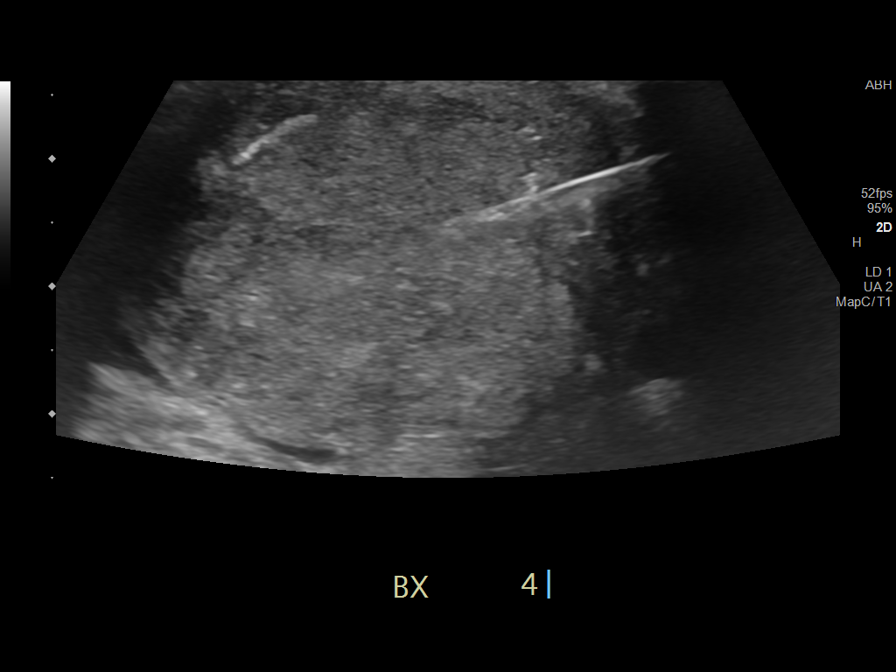
[im 8/9]
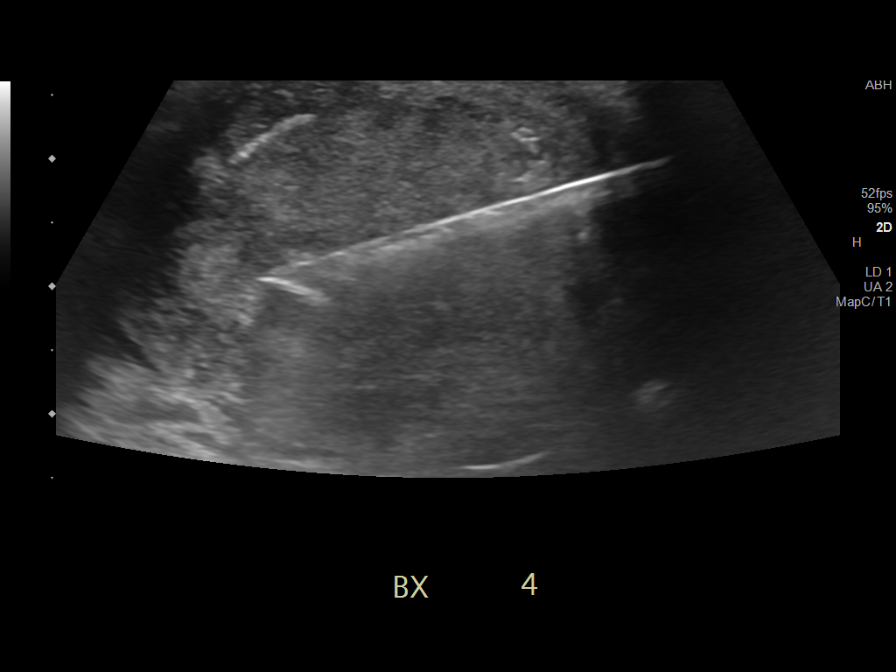
[im 9/9]
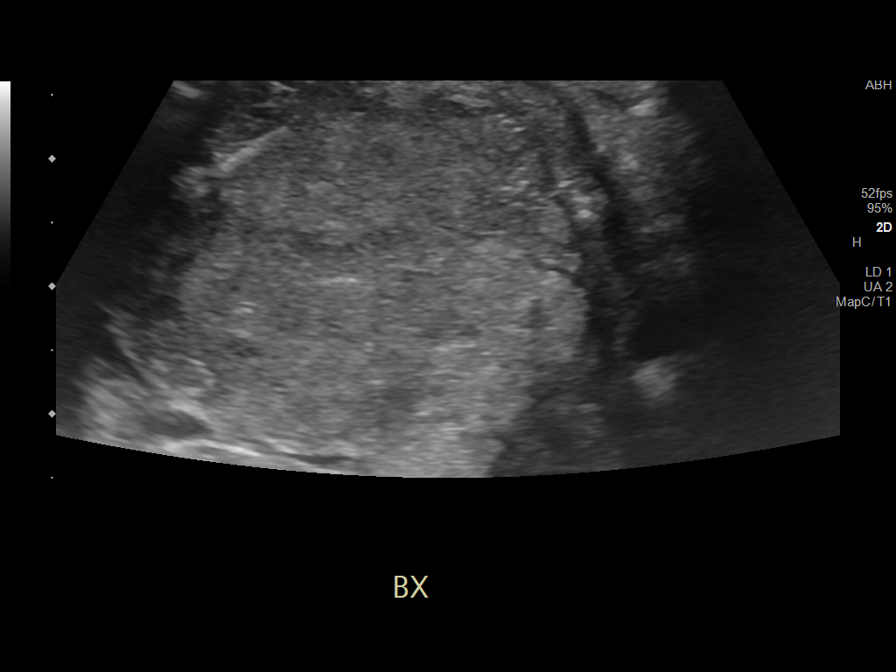

[9 of 9 positions shown; findings below may reference images not displayed]

EXAM:
ULTRASOUND-GUIDED RIGHT NECK MASS BIOPSY

MEDICATIONS:
None.

ANESTHESIA/SEDATION:
None

FLUOROSCOPY TIME:  None

COMPLICATIONS:
None

PROCEDURE:
Informed written consent was obtained from the patient after a
thorough discussion of the procedural risks, benefits and
alternatives. All questions were addressed. Maximal Sterile Barrier
Technique was utilized including caps, mask, sterile gowns, sterile
gloves, sterile drape, hand hygiene and skin antiseptic. A timeout
was performed prior to the initiation of the procedure.

Ultrasound survey was performed with images stored and sent to PACs.

The right neck was prepped with chlorhexidine in a sterile fashion,
and a sterile drape was applied covering the operative field. A
sterile gown and sterile gloves were used for the procedure. Local
anesthesia was provided with 1% Lidocaine.

Ultrasound guidance was used to infiltrate the region with 1%
lidocaine for local anesthesia. Small stab incision was made with 11
blade scalpel. Four separate 16 gauge core biopsy were then acquired
of the right neck nodal mass using ultrasound guidance. Images were
stored.

Final image was stored after biopsy.

Patient tolerated the procedure well and remained hemodynamically
stable throughout.

No complications were encountered and no significant blood loss was
encounter
IMPRESSION: Status post ultrasound-guided right neck mass biopsy.

## 2023-09-15 MED ORDER — ENOXAPARIN SODIUM 30 MG/0.3ML IJ SOSY: 30.0000 mg | PREFILLED_SYRINGE | INTRAMUSCULAR | Status: DC

## 2023-09-15 MED ORDER — SALINE SPRAY 0.65 % NA SOLN: 2.0000 | NASAL | Status: DC | PRN

## 2023-09-15 MED ORDER — BENZOCAINE 20 % MT AERO: INHALATION_SPRAY | Freq: Three times a day (TID) | OROMUCOSAL | Status: DC | PRN

## 2023-09-15 MED ORDER — HYDROMORPHONE HCL 1 MG/ML IJ SOLN
0.5000 mg | INTRAMUSCULAR | Status: DC | PRN
  Administered 2023-09-15 – 2023-09-17 (×5): 1 mg via INTRAVENOUS
  Filled 2023-09-15 (×6): qty 1

## 2023-09-15 MED ORDER — FLUCONAZOLE 200 MG PO TABS
400.0000 mg | ORAL_TABLET | Freq: Every day | ORAL | Status: DC
  Administered 2023-09-16 – 2023-09-19 (×4): 400 mg via ORAL
  Filled 2023-09-15 (×5): qty 2

## 2023-09-15 MED ORDER — DIAZEPAM 5 MG/5ML PO SOLN: 3.0000 mg | Freq: Three times a day (TID) | ORAL | Status: DC | PRN

## 2023-09-15 MED ORDER — ACETAMINOPHEN 650 MG RE SUPP: 650.0000 mg | Freq: Four times a day (QID) | RECTAL | Status: DC | PRN

## 2023-09-15 MED ORDER — PREGABALIN 50 MG PO CAPS
50.0000 mg | ORAL_CAPSULE | Freq: Three times a day (TID) | ORAL | Status: DC
  Administered 2023-09-15 – 2023-09-19 (×12): 50 mg via ORAL
  Filled 2023-09-15 (×12): qty 2

## 2023-09-15 MED ORDER — ENOXAPARIN SODIUM 40 MG/0.4ML IJ SOSY: 40.0000 mg | PREFILLED_SYRINGE | INTRAMUSCULAR | Status: DC

## 2023-09-15 MED ORDER — ENOXAPARIN SODIUM 40 MG/0.4ML IJ SOSY
40.0000 mg | PREFILLED_SYRINGE | INTRAMUSCULAR | Status: AC
  Administered 2023-09-15: 40 mg via SUBCUTANEOUS
  Filled 2023-09-15: qty 0.4

## 2023-09-15 MED ORDER — ONDANSETRON HCL 4 MG/2ML IJ SOLN: 4.0000 mg | Freq: Four times a day (QID) | INTRAMUSCULAR | Status: DC | PRN

## 2023-09-15 MED ORDER — OXYCODONE HCL 5 MG PO TABS
5.0000 mg | ORAL_TABLET | ORAL | Status: DC | PRN
  Administered 2023-09-15 – 2023-09-16 (×2): 5 mg via ORAL
  Filled 2023-09-15 (×2): qty 1

## 2023-09-15 MED ORDER — FLUCONAZOLE 200 MG PO TABS: 800.0000 mg | ORAL_TABLET | Freq: Every day | ORAL | Status: DC

## 2023-09-15 MED ORDER — ONDANSETRON HCL 4 MG PO TABS: 4.0000 mg | ORAL_TABLET | Freq: Four times a day (QID) | ORAL | Status: DC | PRN

## 2023-09-15 MED ORDER — ALBUTEROL SULFATE (2.5 MG/3ML) 0.083% IN NEBU: 2.5000 mg | INHALATION_SOLUTION | RESPIRATORY_TRACT | Status: DC | PRN

## 2023-09-15 MED ORDER — ACETAMINOPHEN 325 MG PO TABS: 650.0000 mg | ORAL_TABLET | Freq: Four times a day (QID) | ORAL | Status: DC | PRN

## 2023-09-15 MED ORDER — METHADONE HCL 10 MG/ML PO CONC
120.0000 mg | Freq: Every day | ORAL | Status: DC
  Administered 2023-09-16: 120 mg via ORAL
  Filled 2023-09-15: qty 15

## 2023-09-15 NOTE — Progress Notes (Addendum)
 Transition of Care [facility]) - Inpatient Brief Assessment   Patient Details  Name: David [name] MRN: [id number] Date of Birth: Jun 02, 1975  Transition of Care [facility]) CM/SW Contact:    Robynn Eileen Hoose, RN Phone Number: 09/15/2023, 3:59 PM   Clinical Narrative: Patient from home with c/o fall. Surgery planned for [place]. ICM to follow for discharge needs after surgery.   Transition of Care Asessment: Insurance and Status: (P) Insurance coverage has been reviewed Patient has primary care physician: (P) Yes Home environment has been reviewed: (P) Apartment Prior level of function:: (P) Independent Prior/Current Home Services: (P) No current home services (No HH hx)   Readmission risk has been reviewed: (P) Yes Transition of care needs: (P) transition of care needs identified, TOC will continue to follow

## 2023-09-15 NOTE — Progress Notes (Signed)
 Patient has arrived to the floor. Patient transferred to [facility]. Brief Progress note to follow.

## 2023-09-15 NOTE — Progress Notes (Signed)
 Called to give report to 5N nurse. Nurse currently with another pt and will return my call.

## 2023-09-15 NOTE — ED Provider Notes (Signed)
 [place] [facility]ERG[facility] DE[facility]MENT AT [facility] Provider Note   CSN: [id number] Arrival date & time: 09/15/23  9089     Patient presents with: David   Jobie [name] is a 48 y.o. male.  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  Patient reports that he suffered a mechanical fall walking alongside the road 3 weeks ago.  Since then he has had pain in his right hip and is concerned it may be broken.  He has been ambulating with his rolling walker despite the discomfort.  No head trauma or other injuries at this time.    Fall       Prior to Admission medications   Medication Sig Start Date End Date Taking? Authorizing Provider  apixaban  (ELIQUIS ) 5 MG TABS tablet TAKE 1 TABLET BY MOUTH TWICE A DAY 03/03/23   Kandis Perkins, DO  diazePAM  5 MG/5ML SOLN Take 3 mLs (3 mg total) by mouth every 8 (eight) hours as needed. TAKE 3 MLS (3 MG TOTAL) BY MOUTH EVERY 8 (EIGHT) HOURS AS NEEDED FOR MUSCLE SPASMS (DYSPHAGIA). 08/18/23   Pickenpack-Cousar, Athena N, NP  fluconazole  (DIFLUCAN ) 200 MG tablet TAKE 4 TABLETS (800 MG TOTAL) BY MOUTH DAILY. 04/03/23   Pickenpack-Cousar, Athena N, NP  Glecaprevir -Pibrentasvir  100-40 MG TABS Take 1 tablet by mouth daily. 11/27/22   Eben Reyes BROCKS, MD  lidocaine  (XYLOCAINE ) 2 % solution Use as directed 15 mLs in the mouth or throat every 3 (three) hours as needed for mouth pain. Mix 1part 2% viscous lidocaine , 1part water . Swish and spit 30mL of total diluted mixture up to eight times a day, prn as needed for soreness 05/02/23   Pickenpack-Cousar, Fannie SAILOR, NP  midodrine  (PROAMATINE ) 5 MG tablet TAKE 1 TABLET (5 MG TOTAL) BY MOUTH 3 (THREE) TIMES DAILY WITH MEALS. 12/25/22   Alvan Ronal BRAVO, MD  nicotine  (NICODERM CQ  - DOSED IN MG/24 HR) 7 mg/24hr patch PLACE 1 PATCH (7 MG TOTAL) ONTO THE SKIN DAILY. 08/18/23   Pickenpack-Cousar, Athena N, NP  ondansetron  (ZOFRAN ) 4 MG tablet Take 1 tablet (4 mg total)  by mouth every 8 (eight) hours as needed for nausea or vomiting. 11/07/22 11/07/23  Jenna Maude BRAVO, NP  Pregabalin  20 MG/ML SOLN Take 50 mg by mouth 3 (three) times daily. 08/19/23   Pickenpack-Cousar, Fannie SAILOR, NP  sertraline  (ZOLOFT ) 20 MG/ML concentrated solution MIX 1.25 MLS (25 MG TOTAL) WITH 1/2 CUP OF WATER  AND TAKE BY MOUTH DAILY. 07/21/23   Kandis Perkins, DO    Allergies: Patient has no known allergies.    Review of Systems  Updated Vital Signs BP 107/87 (BP Location: Left Arm)   Pulse 87   Temp 98.5 F (36.9 C) (Oral)   Resp (!) 24   Ht 5' 10 (1.778 m)   Wt 43 kg   SpO2 100%   BMI 13.60 kg/m   Physical Exam Vitals and nursing note reviewed.  HENT:     Head: Normocephalic and atraumatic.  Cardiovascular:     Rate and Rhythm: Normal rate and regular rhythm.  Pulmonary:     Effort: Pulmonary effort is normal.  Abdominal:     General: There is no distension.  Musculoskeletal:     Comments: Shortening and external rotation of right lower extremity Distal pulses intact bilateral lower extremities Sensation intact to light touch to bilateral lower extremities Limited strength testing with flexion at the right hip Full active range of  motion with flexion of the left hip left knee Ecchymosis and abrasion over anterior right knee without deformity  Neurological:     Mental Status: He is alert.  Psychiatric:        Mood and Affect: Mood normal.     (all labs ordered are listed, but only abnormal results are displayed) Labs Reviewed  BASIC METABOLIC PANEL WITH GFR - Abnormal; Notable for the following components:      Result Value   Glucose, Bld 64 (*)    BUN 22 (*)    All other components within normal limits  CBC WITH DIFFERENTIAL/PLATELET - Abnormal; Notable for the following components:   Hemoglobin 11.5 (*)    HCT 38.8 (*)    MCHC 29.6 (*)    RDW 15.7 (*)    Lymphs Abs 0.4 (*)    All other components within normal limits    EKG: EKG  Interpretation Date/Time:  Monday September 15 2023 10:07:25 EDT Ventricular Rate:  88 PR Interval:  134 QRS Duration:  99 QT Interval:  456 QTC Calculation: 552 R Axis:   81  Text Interpretation: Sinus rhythm Consider right atrial enlargement Borderline T abnormalities, inferior leads Prolonged QT interval Confirmed by Pamella Sharper [phone]) on 09/15/2023 11:28:30 AM  Radiology: [facility] Pelvis Portable Result Date: 09/15/2023 CLINICAL DATA:  Hip pain.  Fall 3 days ago. EXAM: PORTABLE PELVIS 1-2 VIEWS COMPARISON:  None Available. FINDINGS: Acute comminuted fracture of the right proximal femoral head and neck junction with superior displacement of the distal fracture component and associated impaction. The right femoral head appears seated in the acetabulum. Evaluation of the sacrum is somewhat limited by overlying bowel-gas. Pubic symphysis is anatomically aligned. IMPRESSION: Acute comminuted, displaced, and foreshortened fracture of the right proximal femoral head and neck junction. Intertrochanteric extension cannot be excluded. Consider CT for further evaluation. Electronically Signed   By: Harrietta Sherry M.D.   On: 09/15/2023 10:53   DG Knee Right Port Result Date: 09/15/2023 CLINICAL DATA:  Fall. EXAM: PORTABLE RIGHT KNEE - 1-2 VIEW COMPARISON:  None Available. FINDINGS: No evidence of fracture, dislocation, or joint effusion. Focus of serpiginous sclerotic change in the proximal tibial metadiaphysis likely reflects osteonecrosis. No aggressive features. Mild medial femorotibial compartment joint space narrowing. Soft tissues are unremarkable. IMPRESSION: 1. No acute osseous abnormality. 2. Mild medial femorotibial compartment joint space narrowing. 3. Focus of serpiginous sclerotic change in the proximal tibial metadiaphysis likely reflects osteonecrosis. Electronically Signed   By: Harrietta Sherry M.D.   On: 09/15/2023 10:49   DG Chest Portable 1 View Result Date: 09/15/2023 EXAM: 1 VIEW XRAY OF  THE CHEST 09/15/2023 10:44:38 AM COMPARISON: 09/03/2022 CLINICAL HISTORY: Fall ?R hip fx. Fall ?R hip fx; Per notes: Patient to ED by POV with c/o fall. Patient states he fell 3 days ago causing pain to right hip. He denies LOC but voices being on thinners, unaware of what thinner he is on. FINDINGS: LUNGS AND PLEURA: Biapical pleural thickening. No focal pulmonary opacity. No pulmonary edema. No pleural effusion. No pneumothorax. HEART AND MEDIASTINUM: Tracheostomy terminates at the level of the clavicles. No acute abnormality of the cardiac and mediastinal silhouettes. BONES AND SOFT TISSUES: Patient is rotated to the right. Multiple wires and leads project over the chest on the frontal radiograph. No acute osseous abnormality. IMPRESSION: 1. No acute process. 2. Physician degradation Electronically signed by: Rockey Kilts MD 09/15/2023 10:49 AM EDT RP Workstation: [id number]     Procedures   Medications Ordered in the  ED - No data to display  Clinical Course as of 09/15/23 1212  Mon Sep 15, 2023  1126 Pelvis x-ray shows acute comminuted displaced foreshortened fracture of the right proximal femoral head neck junction.  CT ordered.  Paged Ortho. [MP]  1149 Discussed with Dr. Sherida (orthopedics) who recommends admission to medicine team here at [facility] with plan for operative fixation later.  Paged hospitalist [MP]  1211 Message received from orthopedic PA on-call.  Orthopedics team would like patient admitted to [facility] with plan for operative fixation there in 2 days.  Awaiting callback from hospitalist [MP]    Clinical Course User Index [MP] Pamella Ozell LABOR, DO                                 Medical Decision Making 48 year old male with chronic medical history as above presenting after a fall 3 weeks ago.  Fell onto his right side and has had right hip pain since then but has been ambulating with his rolling walker.  On exam the right lower extremity does appear shortened and  externally rotated concerning for fracture of the right hip.  Abrasion and ecchymosis over right anterior knee as well.  No other evidence of trauma.  He is on Eliquis  but given that the fall happened 3 weeks ago low suspicion for acute traumatic bleeding.  Will obtain basic laboratory workup along with chest x-ray hip x-ray and x-ray of the right knee to look for traumatic injury.  Amount and/or Complexity of Data Reviewed Labs: ordered. Radiology: ordered.  Risk Decision regarding hospitalization.        Final diagnoses:  Closed fracture of right femur, unspecified fracture morphology, unspecified portion of femur, initial encounter [facility])    ED Discharge Orders     None          Pamella Ozell LABOR, DO 09/15/23 1150

## 2023-09-15 NOTE — Plan of Care (Signed)
 Spoke with Resident Dr. Libby Blanch who has accepted this patient to the teaching service upon arrival to [facility] later today.  Gave brief signout over the phone, asked that his team make sure to verify vital medication doses specifically fluconazole  so that they can be continued.

## 2023-09-15 NOTE — ED Triage Notes (Signed)
 Patient to ED by POV with c/o fall. Patient states he fell 3 days ago causing pain to right hip. He denies LOC but voices being on thinners, unaware of what thinner he is on.

## 2023-09-15 NOTE — Progress Notes (Signed)
 Brief acceptance IMTS progress note  Subjective: Patient evaluated at bedside.  Accompanied by his mother.  Introduced him to our team.  Patient was being wheeled out of his room and into another room during encounter.  Patient concerned that he has not gotten his medicines yet, reassured that they will be started as soon as the transfer from [name] is complete.  Patient says IV line is uncomfortable.  Discussed that it was difficult to get an IV placed, better if we keep this IV in and encourage patient to try not to move or bend his arm.  Patient wanting us  to know that he uses a nasal mist as well as a tracheostomy spray.   Medications: Eliquis  5 mg twice daily Diazepam  3 mg every 8 hours as needed Diflucan  4 tablets (800 milligrams) daily Lidocaine  solution Midodrine  5 mg 3 times daily with meals- not taking  Nicotine  patch Ondansetron  4 mg every 8 hours as needed Pregabalin  50 mg 3 times daily Zoloft  25 mg daily  Objective: General: Alert, patient is resting comfortably in bed in no acute distress, sarcopenic Eyes: EOM intact  Head: Normocephalic, atraumatic, rightward neck contracture (torticollis of the sternocleidomastoid muscle?) Neck: Range of motion decreased, fixed contracture, no JVD, tracheostomy in place and functioning Cardio: Regular rate and rhythm, no murmurs, rubs or gallops Chest: No chest tenderness Pulmonary: Normal effort, without use of extra respiratory muscles Abdomen: Soft, nontender with normoactive bowel sounds   Neuro: Alert and orientated x3 Skin: No rashes noted, pale, has a tattoo on the left arm  Labs: 09/15/2022:  BMP: Sodium 140, potassium 4.4, creatinine 0.60 CBC: Hemoglobin 11.5, hematocrit 38.8, platelets 322, white count 6.7  Imaging: Chest x-ray IMPRESSION: 1. No acute process. 2. Physical degradation  Pelvic x-ray: IMPRESSION: Acute comminuted, displaced, and foreshortened fracture of the right proximal femoral head and neck  junction. Intertrochanteric extension cannot be excluded. Consider CT for further evaluation.  Right knee x-ray: IMPRESSION: 1. No acute osseous abnormality. 2. Mild medial femorotibial compartment joint space narrowing. 3. Focus of serpiginous sclerotic change in the proximal tibial metadiaphysis likely reflects osteonecrosis.   CT hip right without contrast: IMPRESSION: 1. Acute comminuted, displaced, and impacted subcapital fracture of the right femoral head and neck junction. 2. 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  Patient was initially admitted at [facility], but required transfer to [facility] for definitive procedure.  #Acute comminuted, displaced, and impacted subcapital fracture of right femoral head and neck junction CT imaging showing femur fracture.  Orthopedics following.  Suspect they will want Eliquis  to washout before taking him to surgery.  No surgery planning as of yet.  Per hospitalist note, bridging with Lovenox  and anticipation of surgery.  Multimodal pain control with as needed Dilaudid  and oxycodone .  It does not appear patient has a history of long-term steroid use, although patient is severely malnourished with the potential for vitamin deficiencies with resulting bone breakdown.  Will order vitamin D , phosphorus and calcium to evaluate etiology of femur fracture and concern  for osteomalacia.  If minerals are sufficient, likely  a nontraumatic fracture and more likely osteoporosis. - Orthopedics following, planning procedure - Multimodal pain control with Dilaudid  and oxycodone  - Holding Eliquis , bridging with enoxaparin  - Will schedule bowel regimen - Will discuss with orthopedics to see if patient needs to be n.p.o. 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 He has completed radiation therapy to the larynx on 02/09/2021, He follows with ENT at [facility] and follows with trach clinic.  No acute concerns during hospitalization. - RT following for trach care - No acute concerns  #Chronic pain syndrome Patient follows palliative care for chronic pain.  He is currently on methadone  at home.  His current dose includes 120 mg of methadone  daily as well as pregabalin  50 mg 3 times daily. -Continue methadone  120 mg daily - Continue pregabalin  50 mg 3 times daily - Breakthrough pain medication ordered  #History of fungal endocarditis Patient has a history of fungal endocarditis. He follows with Dr. Overton of ID.  Per his last note on 01/07/2023, patient to be on 400 mg of fluconazole  daily.  He is to continue this indefinitely.  There has been some charting inconsistency between 400 and 800 daily, confirmed with patient it is 400 mg daily. -Resume home fluconazole  400 mg daily  #History of right IJ vein occlusion Patient has a past medical history of right IJ vein occlusion.  This was diagnosed on admission in 09/04/2022.  Patient encouraged to be on Eliquis  indefinitely.  In anticipation of surgery, we will hold Eliquis  and bridged with Lovenox . - Hold Eliquis  - Bridged with Lovenox   Diet: Regular, and NPO at midnight  VTE prophylaxis: Lovenox 

## 2023-09-15 NOTE — ED Notes (Signed)
 [facility] called, transportation arranged.

## 2023-09-15 NOTE — Hospital Course (Addendum)
 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  Patient was initially admitted at [facility], but required transfer to [facility] for definitive procedure.   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  Orthopedics following.  Suspect they will want Eliquis  to washout before taking him to surgery.  No surgery planning as of yet.  Per hospitalist note, bridging with Lovenox  and anticipation of surgery.  Multimodal pain control with as needed Dilaudid  and oxycodone .  It does not appear patient has a history of long-term steroid use, although patient is severely malnourished with the potential for vitamin deficiencies with resulting bone breakdown. Was not using assistive devices prior to this fall 3 weeks ago.  Calcium and phosphorus are normal although vitamin D25 hydroxy 11.67.  Will supplement vitamin D  while in hospital and continue on discharge, will follow-up for ground-level fall and fracture, osteoporosis, in outpatient setting. Phosphorous 2.7 Vitamin D25OH 11.67 - Start vitamin D  50,000 units weekly for 6-8weeks, for 5 more weeks after discharge Dr. Kendal recommends weightbearing as tolerated, postoperative cefazolin  2 g once. Monitor hemoglobin with plans to restart apixaban  once hemoglobin is stable, likely postop day 2 (Friday).  Follow-up with Dr. Kendal 2 weeks after discharge for wound check and repeat x-rays - Multimodal pain control  Tylenol  650 mg 3 times daily, Robaxin  500 mg q 6 hours PRN, Oxycodone  5-10 mg q 4 hours PRN, Dilaudid  0.5-1 mg q 2 hours PRN - Polyethylene glycol daily  as bowel regimen -Monitor hemoglobin with plans to restart apixaban  once hemoglobin is stable, hemoglobin was 10.7 before surgery, it is 7.1-day after surgery.  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  Rechecked and sodium 137.   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 He has completed radiation therapy to the larynx on 02/09/2021, He follows with ENT at [facility] and follows with trach clinic.  No acute concerns during hospitalization. Have benzocaine  mouth spray ordered as needed for pain.  Ordered D5 and LR to increase hydration and calories given surgery tomorrow and potential diminished oral intake today.   Complicated by severe torticollis, has been recommended to pursue a G-tube but patient prefers not to. But his swallowing has gotten worse while being in the hospital.  Speech does not recommend further modified barium swallow studies as they will offer little benefit.  Speech recommends encouraging PEG.  Speech recommends continuing the current diet.  Speech recommends crushing his medicines with pure. Speech evaluated him again after surgery, no incidence of nasal regurgitation, and minimal mobility of the larynx, intermittent aspiration, multiple follow-up swallows needed to move bolus into throat. - Lidocaine  mouth solution every 6 hours as needed -Sodium chloride  nasal spray as needed -Guaifenesin  tablet 2 times daily as needed - RT following for trach care - No acute concerns   #Chronic pain syndrome Patient follows palliative care for chronic pain.  He is currently on methadone  at home.  His current dose includes 130 mg of methadone   daily as well as pregabalin  50 mg 3 times daily. 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 He follows with Dr. Overton of ID.  Per his last note on 01/07/2023, patient to be on 400 mg of fluconazole  daily.  He is to continue this indefinitely.  There has been some charting inconsistency between 400 and 800 daily, confirmed with patient it is 400 mg daily. -Resume home fluconazole  400 mg daily   #History of right IJ vein occlusion Patient has a past medical history of right IJ vein occlusion.  This was diagnosed on admission in 09/04/2022.  Patient encouraged to be on Eliquis  indefinitely.  In anticipation of surgery, we held Eliquis  and bridged with Lovenox .

## 2023-09-15 NOTE — H&P (Signed)
 History and Physical  David [name] FMW:[id number] DOB: [birth year] DOA: 09/15/2023  PCP: Kandis Perkins, DO   Chief Complaint: R hip pain   HPI: David [name] is a 48 y.o. 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  Patient states that he was walking on the street, he tripped over [address] on the street that he was walking on, rolled onto his right side and hit an elevated section of the curb.  He had immediate severe pain, however he was able to bear weight and ambulate with some difficulty, over the last few weeks this discomfort has actually improved very gradually.  Overall he has been losing weight, has not had much of an appetite.  He denies any fevers, or chills, or shortness of breath or any other infectious symptoms.  He has been compliant with methadone  tells me that he takes 120 mg daily, has also been taking his fluconazole  800 mg p.o. daily.  Review of Systems: Please see HPI for pertinent positives and negatives. A complete 10 system review of systems are otherwise negative.  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)     Pneumococcal bacteremia 06/09/2021   Pneumothorax    Left lung spontaneous pneumothorax at age 70 yr    S/P percutaneous endoscopic gastrostomy (PEG) tube placement (HCC) 06/24/2021   Past Surgical History:  Procedure Laterality Date   chest tubes Left    IR GASTROSTOMY TUBE MOD SED  06/18/2021   LAPAROSCOPIC INSERTION GASTROSTOMY TUBE N/A 06/20/2021   Procedure: LAPAROSCOPIC INSERTION GASTROSTOMY TUBE;  Surgeon: Signe Mitzie LABOR, MD;  Location: [facility] OR;  Service: General;  Laterality: N/A;  DOW PATIENT   TRACHEOSTOMY TUBE PLACEMENT N/A 06/09/2021   Procedure: AWAKE TRACHEOSTOMY;  Surgeon: Carlie Clark, MD;  Location: [facility] OR;  Service: ENT;  Laterality: N/A;   Social History:  reports that he has been smoking cigarettes. He has never used smokeless tobacco. He reports that he does not currently use alcohol after a past usage of about 12.0 standard drinks of alcohol per week. He reports that he does not currently use drugs after having used the following drugs: IV.  No Known Allergies  No family history on file.   Prior to Admission medications   Medication Sig Start Date End Date Taking? Authorizing Provider  apixaban  (ELIQUIS ) 5 MG TABS tablet TAKE 1 TABLET BY MOUTH TWICE A DAY 03/03/23   Bender, Perkins, DO  diazePAM  5 MG/5ML SOLN Take 3 mLs (3 mg total) by mouth every 8 (eight) hours as needed. TAKE 3 MLS (3 MG TOTAL) BY MOUTH EVERY 8 (EIGHT) HOURS AS NEEDED FOR MUSCLE SPASMS (DYSPHAGIA). 08/18/23   Pickenpack-Cousar, Athena N, NP  fluconazole  (DIFLUCAN ) 200  MG tablet TAKE 4 TABLETS (800 MG TOTAL) BY MOUTH DAILY. 04/03/23   Pickenpack-Cousar, Athena N, NP  Glecaprevir -Pibrentasvir  100-40 MG TABS Take 1 tablet by mouth daily. 11/27/22   Eben Reyes BROCKS, MD  lidocaine  (XYLOCAINE ) 2 % solution Use as directed 15 mLs in the mouth or throat every 3 (three) hours as needed for mouth pain. Mix 1part 2% viscous lidocaine , 1part water . Swish and spit 30mL of total diluted mixture up to eight times a day,  prn as needed for soreness 05/02/23   Pickenpack-Cousar, Athena N, NP  midodrine  (PROAMATINE ) 5 MG tablet TAKE 1 TABLET (5 MG TOTAL) BY MOUTH 3 (THREE) TIMES DAILY WITH MEALS. 12/25/22   Alvan Ronal BRAVO, MD  nicotine  (NICODERM CQ  - DOSED IN MG/24 HR) 7 mg/24hr patch PLACE 1 PATCH (7 MG TOTAL) ONTO THE SKIN DAILY. 08/18/23   Pickenpack-Cousar, Athena N, NP  ondansetron  (ZOFRAN ) 4 MG tablet Take 1 tablet (4 mg total) by mouth every 8 (eight) hours as needed for nausea or vomiting. 11/07/22 11/07/23  Jenna Maude BRAVO, NP  Pregabalin  20 MG/ML SOLN Take 50 mg by mouth 3 (three) times daily. 08/19/23   Pickenpack-Cousar, Athena N, NP  sertraline  (ZOLOFT ) 20 MG/ML concentrated solution MIX 1.25 MLS (25 MG TOTAL) WITH 1/2 CUP OF WATER  AND TAKE BY MOUTH DAILY. 07/21/23   Kandis Perkins, DO    Physical Exam: BP 107/87 (BP Location: Left Arm)   Pulse 87   Temp 98.5 F (36.9 C) (Oral)   Resp (!) 24   Ht 5' 10 (1.778 m)   Wt 43 kg   SpO2 100%   BMI 13.60 kg/m  General:  Alert, oriented, calm, in no acute distress, thin emaciated gentleman with tracheostomy in place.  His mother is also at the bedside. Cardiovascular: RRR, no murmurs or rubs, no peripheral edema  Respiratory: clear to auscultation bilaterally, no wheezes, no crackles  Abdomen: soft, nontender, nondistended, normal bowel tones heard  Skin: dry, no rashes  Musculoskeletal: no joint effusions, normal range of motion  Psychiatric: appropriate affect, normal speech  Neurologic: extraocular muscles intact, clear speech, moving all extremities with intact sensorium         Labs on Admission:  Basic Metabolic Panel: Recent Labs  Lab 09/15/23 1034  NA 140  K 4.4  CL 101  CO2 29  GLUCOSE 64*  BUN 22*  CREATININE 0.62  CALCIUM 9.6   Liver Function Tests: No results for input(s): AST, ALT, ALKPHOS, BILITOT, PROT, ALBUMIN  in the last 168 hours. No results for input(s): LIPASE, AMYLASE in the last 168 hours. No results  for input(s): AMMONIA in the last 168 hours. CBC: Recent Labs  Lab 09/15/23 1034  WBC 6.7  NEUTROABS 5.7  HGB 11.5*  HCT 38.8*  MCV 90.0  PLT 322   Cardiac Enzymes: No results for input(s): CKTOTAL, CKMB, CKMBINDEX, TROPONINI in the last 168 hours. BNP (last 3 results) No results for input(s): BNP in the last 8760 hours.  ProBNP (last 3 results) No results for input(s): PROBNP in the last 8760 hours.  CBG: No results for input(s): GLUCAP in the last 168 hours.  Radiological Exams on Admission: CT Hip Right Wo Contrast Result Date: 09/15/2023 CLINICAL DATA:  Fall.  Proximal femoral fracture. EXAM: CT OF THE RIGHT HIP WITHOUT CONTRAST TECHNIQUE: Multidetector CT imaging of the right hip was performed according to the standard protocol. Multiplanar CT image reconstructions were also generated. RADIATION DOSE REDUCTION: This exam was performed according to the departmental dose-optimization  program which includes automated exposure control, adjustment of the mA and/or kV according to patient size and/or use of iterative reconstruction technique. COMPARISON:  Same-day pelvic radiograph dated 09/15/2023. FINDINGS: Bones/Joint/Cartilage Acute comminuted subcapital fracture of the right femoral head and neck junction with approximately 1.5 cm of lateral displacement of the distal fracture component and 2.8 cm of impaction. No intertrochanteric extension. The right femoral head is seated within the acetabulum. Nondisplaced fracture of the right inferior pubic ramus demonstrates partial sclerotic margins with suspected marginal callus formation, suggestive of a subacute to chronic fracture (series 5, images 16-18). Pubic symphysis is anatomically aligned. Ligaments Ligaments are suboptimally evaluated by CT. Muscles and Tendons No loculated intramuscular collection. Soft tissue Soft tissue edema along the right hip. No fluid collection. No soft tissue mass. IMPRESSION: 1. Acute  comminuted, displaced, and impacted subcapital fracture of the right femoral head and neck junction. 2. Nondisplaced fracture of the right inferior pubic ramus demonstrates partial sclerotic margins with suspected marginal callus formation, suggestive of a subacute to chronic fracture Electronically Signed   By: Harrietta Sherry M.D.   On: 09/15/2023 12:23   DG Pelvis Portable Result Date: 09/15/2023 CLINICAL DATA:  Hip pain.  Fall 3 days ago. EXAM: PORTABLE PELVIS 1-2 VIEWS COMPARISON:  None Available. FINDINGS: Acute comminuted fracture of the right proximal femoral head and neck junction with superior displacement of the distal fracture component and associated impaction. The right femoral head appears seated in the acetabulum. Evaluation of the sacrum is somewhat limited by overlying bowel-gas. Pubic symphysis is anatomically aligned. IMPRESSION: Acute comminuted, displaced, and foreshortened fracture of the right proximal femoral head and neck junction. Intertrochanteric extension cannot be excluded. Consider CT for further evaluation. Electronically Signed   By: Harrietta Sherry M.D.   On: 09/15/2023 10:53   DG Knee Right Port Result Date: 09/15/2023 CLINICAL DATA:  Fall. EXAM: PORTABLE RIGHT KNEE - 1-2 VIEW COMPARISON:  None Available. FINDINGS: No evidence of fracture, dislocation, or joint effusion. Focus of serpiginous sclerotic change in the proximal tibial metadiaphysis likely reflects osteonecrosis. No aggressive features. Mild medial femorotibial compartment joint space narrowing. Soft tissues are unremarkable. IMPRESSION: 1. No acute osseous abnormality. 2. Mild medial femorotibial compartment joint space narrowing. 3. Focus of serpiginous sclerotic change in the proximal tibial metadiaphysis likely reflects osteonecrosis. Electronically Signed   By: Harrietta Sherry M.D.   On: 09/15/2023 10:49   DG Chest Portable 1 View Result Date: 09/15/2023 EXAM: 1 VIEW XRAY OF THE CHEST 09/15/2023  10:44:38 AM COMPARISON: 09/03/2022 CLINICAL HISTORY: Fall ?R hip fx. Fall ?R hip fx; Per notes: Patient to ED by POV with c/o fall. Patient states he fell 3 days ago causing pain to right hip. He denies LOC but voices being on thinners, unaware of what thinner he is on. FINDINGS: LUNGS AND PLEURA: Biapical pleural thickening. No focal pulmonary opacity. No pulmonary edema. No pleural effusion. No pneumothorax. HEART AND MEDIASTINUM: Tracheostomy terminates at the level of the clavicles. No acute abnormality of the cardiac and mediastinal silhouettes. BONES AND SOFT TISSUES: Patient is rotated to the right. Multiple wires and leads project over the chest on the frontal radiograph. No acute osseous abnormality. IMPRESSION: 1. No acute process. 2. Physician degradation Electronically signed by: Rockey Kilts MD 09/15/2023 10:49 AM EDT RP Workstation: [id number]   Assessment/Plan David [name] is a 48 y.o. male with medical history significant for stage IV squamous cell carcinoma, fungal endocarditis and emergent tracheostomy currently on Diflucan , chronic pain on methadone  and jugular venous  occlusion on Eliquis  who had a mechanical fall while walking on the street about 3 weeks ago and is now being admitted to the hospital with acute comminuted, impacted and displaced right femoral neck and head fracture.  Right hip fracture-after mechanical fall approximately 3 weeks ago, he has been ambulating with significant difficulty since that time.  ER provider discussed with orthopedic surgery Dr. Sherida who plans surgical repair on 8/20, at [facility]. -Inpatient admission -Pain and nausea control -Bedrest nonweightbearing on the right lower extremity  Chronic pain-continue home methadone , with IV Dilaudid  for breakthrough pain  Fungal endocarditis-continue fluconazole , once dosing is confirmed  Jugular venous occlusion-on Eliquis , last dose was 8/18 AM -Hold anticoagulation with Lovenox  bridge, in  anticipation for surgery  Depression-sertraline   DVT prophylaxis: Lovenox      Code Status: Full Code  Consults called: Orthopedic surgery  Admission status: The appropriate patient status for this patient is INPATIENT. Inpatient status is judged to be reasonable and necessary in order to provide the required intensity of service to ensure the patient's safety. The patient's presenting symptoms, physical exam findings, and initial radiographic and laboratory data in the context of their chronic comorbidities is felt to place them at high risk for further clinical deterioration. Furthermore, it is not anticipated that the patient will be medically stable for discharge from the hospital within 2 midnights of admission.    I certify that at the point of admission it is my clinical judgment that the patient will require inpatient hospital care spanning beyond 2 midnights from the point of admission due to high intensity of service, high risk for further deterioration and high frequency of surveillance required  Time spent: 59 minutes  Mareesa Gathright CHRISTELLA Gail MD [facility] Pager [phone]  If 7PM-7AM, please contact night-coverage [email or web address] Password [facility]  09/15/2023, 12:45 PM

## 2023-09-15 NOTE — H&P (View-Only) (Signed)
 Reason for Consult:Right hip fx Referring Physician: Ozell Marine Time called: 1515 Time at bedside: 1532   David [name] is an 48 y.o. male.  HPI: [name] fell about 3 weeks ago when he tripped on a median crossing a road. He had immediate pain but was able to get up. He's had moderate pain since then and has been reliant on crawling or being pushed in a RW mostly. Apparently a couple of days ago the pain got much worse and he came in for evaluation. X-rays showed a right femoral neck fx and orthopedic surgery was consulted. He lives at home alone and was not using any assistive devices prior to this fall.  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)    Pneumococcal bacteremia 06/09/2021   Pneumothorax    Left lung spontaneous pneumothorax at age 70 yr    S/P percutaneous endoscopic gastrostomy (PEG) tube placement (HCC) 06/24/2021    Past Surgical History:  Procedure Laterality Date   chest tubes Left    IR GASTROSTOMY TUBE MOD SED  06/18/2021   LAPAROSCOPIC INSERTION GASTROSTOMY TUBE N/A 06/20/2021   Procedure: LAPAROSCOPIC INSERTION GASTROSTOMY TUBE;  Surgeon: Signe Mitzie LABOR, MD;  Location: [facility] OR;  Service: General;  Laterality: N/A;  DOW PATIENT   TRACHEOSTOMY TUBE PLACEMENT N/A 06/09/2021   Procedure: AWAKE TRACHEOSTOMY;  Surgeon: Carlie Clark, MD;  Location: [facility] OR;  Service: ENT;  Laterality: N/A;    No family history on file.  Social History:  reports that he has been smoking  cigarettes. He has never used smokeless tobacco. He reports that he does not currently use alcohol after a past usage of about 12.0 standard drinks of alcohol per week. He reports that he does not currently use drugs after having used the following drugs: IV.  Allergies: No Known Allergies  Medications: I have reviewed the patient's current medications.  Results for orders placed or performed during the hospital encounter of 09/15/23 (from the past 48 hours)  Basic metabolic panel     Status: Abnormal   Collection Time: 09/15/23 10:34 AM  Result Value Ref Range   Sodium 140 135 - 145 mmol/L   Potassium 4.4 3.5 - 5.1 mmol/L   Chloride 101 98 - 111 mmol/L   CO2 29 22 - 32 mmol/L   Glucose, Bld 64 (L) 70 - 99 mg/dL    Comment: Glucose reference range applies only to samples taken after fasting for at least 8 hours.   BUN 22 (H) 6 - 20 mg/dL   Creatinine, Ser 9.37 0.61 - 1.24 mg/dL   Calcium 9.6 8.9 - 89.6 mg/dL   GFR, Estimated >39 >39 mL/min    Comment: (NOTE) Calculated using the CKD-EPI Creatinine Equation (2021)    Anion gap 10 5 - 15    Comment: Performed at [facility], [address], [place], [place] [address]  CBC with Differential     Status: Abnormal   Collection Time: 09/15/23 10:34 AM  Result Value Ref Range  WBC 6.7 4.0 - 10.5 K/uL   RBC 4.31 4.22 - 5.81 MIL/uL   Hemoglobin 11.5 (L) 13.0 - 17.0 g/dL   HCT 61.1 (L) 60.9 - 47.9 %   MCV 90.0 80.0 - 100.0 fL   MCH 26.7 26.0 - 34.0 pg   MCHC 29.6 (L) 30.0 - 36.0 g/dL   RDW 84.2 (H) 88.4 - 84.4 %   Platelets 322 150 - 400 K/uL   nRBC 0.0 0.0 - 0.2 %   Neutrophils Relative % 86 %   Neutro Abs 5.7 1.7 - 7.7 K/uL   Lymphocytes Relative 6 %   Lymphs Abs 0.4 (L) 0.7 - 4.0 K/uL   Monocytes Relative 7 %   Monocytes Absolute 0.5 0.1 - 1.0 K/uL   Eosinophils Relative 0 %   Eosinophils Absolute 0.0 0.0 - 0.5 K/uL   Basophils Relative 1 %   Basophils Absolute 0.1 0.0 - 0.1 K/uL   Immature Granulocytes 0  %   Abs Immature Granulocytes 0.02 0.00 - 0.07 K/uL    Comment: Performed at [facility], [address] [place], [place] [address]    CT Hip Right Wo Contrast Result Date: 09/15/2023 CLINICAL DATA:  Fall.  Proximal femoral fracture. EXAM: CT OF THE RIGHT HIP WITHOUT CONTRAST TECHNIQUE: Multidetector CT imaging of the right hip was performed according to the standard protocol. Multiplanar CT image reconstructions were also generated. RADIATION DOSE REDUCTION: This exam was performed according to the departmental dose-optimization program which includes automated exposure control, adjustment of the mA and/or kV according to patient size and/or use of iterative reconstruction technique. COMPARISON:  Same-day pelvic radiograph dated 09/15/2023. FINDINGS: Bones/Joint/Cartilage Acute comminuted subcapital fracture of the right femoral head and neck junction with approximately 1.5 cm of lateral displacement of the distal fracture component and 2.8 cm of impaction. No intertrochanteric extension. The right femoral head is seated within the acetabulum. Nondisplaced fracture of the right inferior pubic ramus demonstrates partial sclerotic margins with suspected marginal callus formation, suggestive of a subacute to chronic fracture (series 5, images 16-18). Pubic symphysis is anatomically aligned. Ligaments Ligaments are suboptimally evaluated by CT. Muscles and Tendons No loculated intramuscular collection. Soft tissue Soft tissue edema along the right hip. No fluid collection. No soft tissue mass. IMPRESSION: 1. Acute comminuted, displaced, and impacted subcapital fracture of the right femoral head and neck junction. 2. Nondisplaced fracture of the right inferior pubic ramus demonstrates partial sclerotic margins with suspected marginal callus formation, suggestive of a subacute to chronic fracture Electronically Signed   By: Harrietta Sherry M.D.   On: 09/15/2023 12:23   DG Pelvis  Portable Result Date: 09/15/2023 CLINICAL DATA:  Hip pain.  Fall 3 days ago. EXAM: PORTABLE PELVIS 1-2 VIEWS COMPARISON:  None Available. FINDINGS: Acute comminuted fracture of the right proximal femoral head and neck junction with superior displacement of the distal fracture component and associated impaction. The right femoral head appears seated in the acetabulum. Evaluation of the sacrum is somewhat limited by overlying bowel-gas. Pubic symphysis is anatomically aligned. IMPRESSION: Acute comminuted, displaced, and foreshortened fracture of the right proximal femoral head and neck junction. Intertrochanteric extension cannot be excluded. Consider CT for further evaluation. Electronically Signed   By: Harrietta Sherry M.D.   On: 09/15/2023 10:53   DG Knee Right Port Result Date: 09/15/2023 CLINICAL DATA:  Fall. EXAM: PORTABLE RIGHT KNEE - 1-2 VIEW COMPARISON:  None Available. FINDINGS: No evidence of fracture, dislocation, or joint effusion. Focus of serpiginous sclerotic change in  the proximal tibial metadiaphysis likely reflects osteonecrosis. No aggressive features. Mild medial femorotibial compartment joint space narrowing. Soft tissues are unremarkable. IMPRESSION: 1. No acute osseous abnormality. 2. Mild medial femorotibial compartment joint space narrowing. 3. Focus of serpiginous sclerotic change in the proximal tibial metadiaphysis likely reflects osteonecrosis. Electronically Signed   By: Harrietta Sherry M.D.   On: 09/15/2023 10:49   DG Chest Portable 1 View Result Date: 09/15/2023 EXAM: 1 VIEW XRAY OF THE CHEST 09/15/2023 10:44:38 AM COMPARISON: 09/03/2022 CLINICAL HISTORY: Fall ?R hip fx. Fall ?R hip fx; Per notes: Patient to ED by POV with c/o fall. Patient states he fell 3 days ago causing pain to right hip. He denies LOC but voices being on thinners, unaware of what thinner he is on. FINDINGS: LUNGS AND PLEURA: Biapical pleural thickening. No focal pulmonary opacity. No pulmonary edema. No  pleural effusion. No pneumothorax. HEART AND MEDIASTINUM: Tracheostomy terminates at the level of the clavicles. No acute abnormality of the cardiac and mediastinal silhouettes. BONES AND SOFT TISSUES: Patient is rotated to the right. Multiple wires and leads project over the chest on the frontal radiograph. No acute osseous abnormality. IMPRESSION: 1. No acute process. 2. Physician degradation Electronically signed by: Rockey Kilts MD 09/15/2023 10:49 AM EDT RP Workstation: [id number]    Review of Systems  HENT:  Negative for ear discharge, ear pain, hearing loss and tinnitus.   Eyes:  Negative for photophobia and pain.  Respiratory:  Negative for cough and shortness of breath.   Cardiovascular:  Negative for chest pain.  Gastrointestinal:  Negative for abdominal pain, nausea and vomiting.  Genitourinary:  Negative for dysuria, flank pain, frequency and urgency.  Musculoskeletal:  Positive for arthralgias (Right hip). Negative for back pain, myalgias and neck pain.  Neurological:  Negative for dizziness and headaches.  Hematological:  Does not bruise/bleed easily.  Psychiatric/Behavioral:  The patient is not nervous/anxious.    Blood pressure (!) 128/92, pulse 88, temperature 99.3 F (37.4 C), temperature source Oral, resp. rate 12, height 5' 10 (1.778 m), weight 43 kg, SpO2 100%. Physical Exam Constitutional:      General: He is not in acute distress.    Appearance: He is well-developed. He is not diaphoretic.  HENT:     Head: Normocephalic and atraumatic.  Eyes:     General: No scleral icterus.       Right eye: No discharge.        Left eye: No discharge.     Conjunctiva/sclera: Conjunctivae normal.  Cardiovascular:     Rate and Rhythm: Normal rate and regular rhythm.  Pulmonary:     Effort: Pulmonary effort is normal. No respiratory distress.  Musculoskeletal:     Cervical back: Normal range of motion.     Comments: RLE No traumatic wounds, ecchymosis, or rash  Mild TTP  hip  No knee or ankle effusion  Knee stable to varus/ valgus and anterior/posterior stress  Sens DPN, SPN, TN intact  Motor EHL, ext, flex, evers 5/5  DP 2+, PT 2+, No significant edema  Skin:    General: Skin is warm and dry.  Neurological:     Mental Status: He is alert.  Psychiatric:        Mood and Affect: Mood normal.        Behavior: Behavior normal.     Assessment/Plan: Right hip fx -- Plan THA Wednesday with Dr. Kendal. Please keep NPO after MN Tuesday. 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 Please hold Eliquis . If need to bridge with heparin  please d/c at [facility] Tuesday night.    Ozell DOROTHA Ned, PA-C Orthopedic Surgery [phone] 09/15/2023, 3:50 PM

## 2023-09-15 NOTE — Progress Notes (Addendum)
 NAME:  David [name], MRN:  [id number], DOB:  [birth year], LOS: 0 ADMISSION DATE:  09/15/2023, CONSULTATION DATE: 8/18   History of Present Illness:  This 48 year old male patient well-known to me.  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  His post radiation course has been complicated by fairly significant neck contractures, dysphagia, weight loss, failure to thrive.  He has a history of narcotic dependence, and is maintained on methadone  in the outpatient setting.  More recently his neck contraction is gotten worse over the last year to the point where we have tried some physical therapy as outpatient but he really has not regained normal alignment.  Because of this we have had to make fairly significant manipulations to his tracheostomy to avoid skin breakdown from the tracheostomy flanges.  He currently has a fairly heavily modified size 7 Portex which has been cut short to allow for #4 inner Shiley inner cannula.  I typically have to cut the tracheostomy flange on the right side an puncture a hole in the tracheostomy flange to attach the trach collar on the right.  He has required inner cannula because he is prone to mucous plugging.   [name] missed his last appointment with me on 29 July.  Since that time he apparently has suffered a fall, his mother Contacted me for advice but unfortunately the patient refused to be seen.  Over the last 3 weeks he has been getting around crawling or being pushed and nerve wheelchair.  The pain got to the point where he finally agreed to be evaluated x-ray showed a right femoral neck fracture.  Orthopedic surgery was consulted, and [facility] was [facility] for surgical intervention  I was called by the respiratory therapist because they were unable to pass suction catheter, and tracheostomy was occluded.  Pertinent  Medical History  Laryngeal cancer  stage IVb Tracheostomy Torticollis Endocarditis Narcotic addiction Failure to thrive  Significant Hospital Events: Including procedures, antibiotic start and stop dates in addition to other pertinent events   8/18 admitted for right hip fracture.  I was asked to see to assist with tracheostomy management via urgent consult by respiratory therapy for tracheostomy occlusion  Interim History / Subjective:  No distress currently  Objective    Blood pressure (!) 128/92, pulse 88, temperature 99.3 F (37.4 C), temperature source Oral, resp. rate 12, height 5' 10 (1.778 m), weight 43 kg, SpO2 100%.       No intake or output data in the 24 hours ending 09/15/23 1647 Filed Weights   09/15/23 0920  Weight: 43 kg    Examination: General: This is a frail 48 year old male patient currently laying in bed.  He is in no acute distress currently following tracheostomy change HENT: Chronic contraction of the neck.  Right ear touching right shoulder this is baseline for him.  Tracheostomy stoma remains midline although given his neck positioning maintenance of tracheostomy has become a bit more challenging.  Currently has a size 7 Portex cuffless trach.  He is able to phonate and communicate with the finger occlusion and PMV.  His speech quality is raspy and weak but this is baseline Lungs: Clear Cardiovascular: Regular rate and rhythm Abdomen: Soft Extremities: Warm, right hip discomfort Neuro: Awake, easily agitated, this is his baseline he is oriented.  He gets frustrated quite easily  Procedure I removed his tracheostomy, it was grossly occluded.  It was cleansed with sterile saline, using a pediatric  bronchoscope the scope and tracheostomy were lubricated, and tracheostomy placed successfully back into the stoma.  There was no evidence of airway occlusion.  Postprocedural end-tidal CO2 present.  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  Does have difficulty with airway clearance from time to time.  Given the degree of his neck contractures he has required tracheostomy modification.  I removed his current tracheostomy, cleaned it with sterile saline, then reinserted it over bronchoscopy without difficulty Plan/rec Continue routine tracheostomy care We are awaiting size 4 Shiley internal cannula's Suction as needed Would provide aerosol and trach collar and continuous pulse oximetry When he goes to the operating room if general anesthesia needed would place a size 6 cuffed endotracheal tube through existing stoma, keep his current trach available, and then replace ett with current tracheostomy postoperatively I will place a new tracheostomy prior to his discharge  Right hip fracture.  He has no absolute contraindications to surgery.  His surgical risk is surprisingly low based on predictive scoring however I would consider him low/moderate.  Primarily because of his degree of deconditioning and malnutrition.  That being said these risks seem outweighed by current need for pain management, as well as hope for regaining mobility 1A) Arozullah - Prolonged mech ventilation risk Arozullah Postperative Pulmonary Risk Score - for mech ventilation dependence >48h USAA, Ann Surg 2000, major non-cardiac surgery) Comment Score  Type of surgery - abd ao aneurysm (27), thoracic (21), neurosurgery / upper abdominal / vascular (21), neck (11) dental 0  Emergency Surgery - (11) scheduled 0  ALbumin  < 3 or poor nutritional state - (9) 3.10 0  BUN > 30 -  (8) 12 0  Partial or completely dependent functional status - (7) Weak/deconditioned 7  COPD -  (6) No  0  Age - 60 to 69 (4), >  70  (6) 48 0  TOTAL   7  Risk Stratifcation scores  - < 10 (0.5%), 11-19 (1.8%), 20-27 (4.2%), 28-40 (10.1%), >40 (26.6%) 7 (0.5% risk)            1B) GUPTA - Prolonged Mech Vent Risk Score source Risk  Guptal post op prolonged mech ventilation > 48h or reintubation < 30 days - ACS 2007-2008 dataset - [email or web address] ASA class 2. With <1.5% risk of need for prolonged mechanical ventilation or need for ventilatory assist <30d prior       2) RISK FOR POST OP PNEUMONIA Score source Risk  Charlanne - Post Op Pnemounia risk  [employer] 0.6% risk of pneumonia       R3) ISK FOR ANY POST-OP PULMONARY COMPLICATION Score source Risk  CANET/ARISCAT Score - risk for ANY/ALl pulmonary complications - > risk of in-hospital post-op pulmonary complications (composite including respiratory failure, respiratory infection, pleural effusion, atelectasis, pneumothorax, bronchospasm, aspiration pneumonitis) [employer].es - based on age, anemia, pulse ox, resp infection prior 30d, incision site, duration of surgery, and emergency v elective surgery     Low risk 1.6% risk of in-hospital post-op pulmonary complications (composite including respiratory failure, respiratory infection, pleural effusion, atelectasis, pneumothorax, bronchospasm, aspiration pneumonitis  Plan Okay to proceed with surgery as long as patient aware of potential risks including prolonged mechanical ventilation  Because he has a will matured stoma his risk for compromised airway is very low.  Best Practice (right click and Reselect all SmartList Selections daily)   Per primary Labs   CBC: Recent Labs  Lab 09/15/23 1034  WBC 6.7  NEUTROABS 5.7  HGB 11.5*  HCT 38.8*  MCV 90.0  PLT 322    Basic Metabolic Panel: Recent Labs  Lab 09/15/23 1034  NA 140  K 4.4  CL 101  CO2  29  GLUCOSE 64*  BUN 22*  CREATININE 0.62  CALCIUM 9.6   GFR: Estimated Creatinine Clearance: 68.7 mL/min (by C-G formula based on SCr of 0.62 mg/dL). Recent Labs  Lab 09/15/23 1034  WBC 6.7    Liver Function Tests: No results for input(s): AST, ALT, ALKPHOS, BILITOT, PROT, ALBUMIN  in the last 168 hours. No results for input(s): LIPASE, AMYLASE in the last 168 hours. No results for input(s): AMMONIA in the last 168 hours.  ABG    Component Value Date/Time   PHART 7.394 06/06/2021 2326   PCO2ART 66.3 (HH) 06/06/2021 2326   PO2ART 216 (H) 06/06/2021 2326   HCO3 40.5 (H) 06/06/2021 2326   TCO2 42 (H) 06/06/2021 2326   O2SAT 100 06/06/2021 2326     Coagulation Profile: No results for input(s): INR, PROTIME in the last 168 hours.  Cardiac Enzymes: No results for input(s): CKTOTAL, CKMB, CKMBINDEX, TROPONINI in the last 168 hours.  HbA1C: No results found for: HGBA1C  CBG: No results for input(s): GLUCAP in the last 168 hours.  Review of Systems:   Review of Systems  Constitutional:  Positive for fever, malaise/fatigue and weight loss.  HENT: Negative.    Eyes: Negative.   Respiratory:  Positive for sputum production.   Cardiovascular: Negative.   Gastrointestinal: Negative.   Genitourinary: Negative.   Musculoskeletal:  Positive for falls and joint pain.  Skin: Negative.   Neurological:  Positive for focal weakness and weakness.  Endo/Heme/Allergies: Negative.      Past Medical History:  He,  has a past medical history of Acute respiratory failure with hypoxia (HCC), AKI (acute kidney injury) (HCC) (01/08/2021), Aspiration pneumonia of right lower lobe (HCC) (06/06/2021), Bacterial endocarditis, Chronic HFrEF (heart failure with reduced ejection fraction) (HCC) (06/09/2021), Community acquired pneumonia due to Pneumococcus (HCC) (04/01/2021), Evaluation by medical service required (03/19/2022), 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HCC), Leaking PEG  tube (HCC) (12/12/2021), Malignant neoplasm of overlapping sites of larynx [facility]), Pneumococcal bacteremia (06/09/2021), Pneumothorax, and S/P percutaneous endoscopic gastrostomy (PEG) tube placement (HCC) (06/24/2021).   Surgical History:   Past Surgical History:  Procedure Laterality Date   chest tubes Left    IR GASTROSTOMY TUBE MOD SED  06/18/2021   LAPAROSCOPIC INSERTION GASTROSTOMY TUBE N/A 06/20/2021   Procedure: LAPAROSCOPIC INSERTION GASTROSTOMY TUBE;  Surgeon: Signe Mitzie LABOR, MD;  Location: [facility] OR;  Service: General;  Laterality: N/A;  DOW PATIENT   TRACHEOSTOMY TUBE PLACEMENT N/A 06/09/2021   Procedure: AWAKE TRACHEOSTOMY;  Surgeon: Carlie Clark, MD;  Location: [facility] OR;  Service: ENT;  Laterality: N/A;     Social History:   reports that he has been smoking cigarettes. He has never used smokeless tobacco. He reports that he does not currently use alcohol after a past usage of about 12.0 standard drinks of alcohol per week. He reports that he does not currently use drugs after having used the following drugs: IV.   Family History:  His family history is not on file.   Allergies No Known Allergies   Home Medications  Prior to Admission medications   Medication Sig Start Date End Date Taking? Authorizing Provider  apixaban  (ELIQUIS ) 5 MG TABS tablet TAKE 1 TABLET BY MOUTH TWICE A DAY Patient taking differently: Take 5 mg by mouth daily. 03/03/23  Yes Bender, Damien, DO  diazePAM  5 MG/5ML SOLN Take 3 mLs (3 mg total) by mouth every 8 (eight) hours as needed. TAKE 3 MLS (3 MG TOTAL) BY MOUTH EVERY 8 (EIGHT) HOURS AS NEEDED FOR MUSCLE SPASMS (DYSPHAGIA). 08/18/23  Yes Pickenpack-Cousar, Fannie SAILOR, NP  fluconazole  (DIFLUCAN ) 200 MG tablet TAKE 4 TABLETS (800 MG TOTAL) BY MOUTH DAILY. 04/03/23  Yes Pickenpack-Cousar, Fannie SAILOR, NP  lidocaine  (XYLOCAINE ) 2 % solution Use as directed 15 mLs in the mouth or throat every 3 (three) hours as needed for mouth pain. Mix 1part 2% viscous lidocaine , 1part  water . Swish and spit 30mL of total diluted mixture up to eight times a day, prn as needed for soreness 05/02/23  Yes Pickenpack-Cousar, Athena N, NP  ondansetron  (ZOFRAN ) 4 MG tablet Take 1 tablet (4 mg total) by mouth every 8 (eight) hours as needed for nausea or vomiting. 11/07/22 11/07/23 Yes Jenna Maude BRAVO, NP  Pregabalin  20 MG/ML SOLN Take 50 mg by mouth 3 (three) times daily. 08/19/23  Yes Pickenpack-Cousar, Fannie SAILOR, NP  sertraline  (ZOLOFT ) 20 MG/ML concentrated solution MIX 1.25 MLS (25 MG TOTAL) WITH 1/2 CUP OF WATER  AND TAKE BY MOUTH DAILY. 07/21/23  Yes Bender, Damien, DO  midodrine  (PROAMATINE ) 5 MG tablet TAKE 1 TABLET (5 MG TOTAL) BY MOUTH 3 (THREE) TIMES DAILY WITH MEALS. Patient not taking: Reported on 09/15/2023 12/25/22   Alvan Ronal BRAVO, MD  nicotine  (NICODERM CQ  - DOSED IN MG/24 HR) 7 mg/24hr patch PLACE 1 PATCH (7 MG TOTAL) ONTO THE SKIN DAILY. Patient not taking: Reported on 09/15/2023 08/18/23   Pickenpack-Cousar, Fannie SAILOR, NP     Critical care time: NA

## 2023-09-15 NOTE — Progress Notes (Signed)
   09/15/23 1550  Oxygen  Therapy/Pulse Ox  O2 Device Room Air;Tracheostomy Collar (RA ATC @BEDSIDE )  O2 Therapy Room air humidified  FiO2 (%) 21 %  SpO2 98 %  Tracheostomy  7 mm Uncuffed  Placement Date/Time: 09/15/23 1630   Serial / Lot #: [id number]  Placed By: Other (Comment)  Brand: (c)   Size (mm): 7 mm  Style: Uncuffed  Status Secured with trach ties  Site Assessment Oozing secretions;Drainage  Site Care Cleansed;Dried;Dressing applied  Ties Assessment Soiled;Changed;Clean, Dry  Tracheostomy Equipment at bedside Yes and checklist posted at head of bed

## 2023-09-15 NOTE — ED Notes (Signed)
 Attempt made to call report to receiving floor.

## 2023-09-15 NOTE — Consult Note (Signed)
 Reason for Consult:Right hip fx Referring Physician: Ozell Marine Time called: 1515 Time at bedside: 1532   David [name] is an 48 y.o. male.  HPI: [name] fell about 3 weeks ago when he tripped on a median crossing a road. He had immediate pain but was able to get up. He's had moderate pain since then and has been reliant on crawling or being pushed in a RW mostly. Apparently a couple of days ago the pain got much worse and he came in for evaluation. X-rays showed a right femoral neck fx and orthopedic surgery was consulted. He lives at home alone and was not using any assistive devices prior to this fall.  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)    Pneumococcal bacteremia 06/09/2021   Pneumothorax    Left lung spontaneous pneumothorax at age 70 yr    S/P percutaneous endoscopic gastrostomy (PEG) tube placement (HCC) 06/24/2021    Past Surgical History:  Procedure Laterality Date   chest tubes Left    IR GASTROSTOMY TUBE MOD SED  06/18/2021   LAPAROSCOPIC INSERTION GASTROSTOMY TUBE N/A 06/20/2021   Procedure: LAPAROSCOPIC INSERTION GASTROSTOMY TUBE;  Surgeon: Signe Mitzie LABOR, MD;  Location: [facility] OR;  Service: General;  Laterality: N/A;  DOW PATIENT   TRACHEOSTOMY TUBE PLACEMENT N/A 06/09/2021   Procedure: AWAKE TRACHEOSTOMY;  Surgeon: Carlie Clark, MD;  Location: [facility] OR;  Service: ENT;  Laterality: N/A;    No family history on file.  Social History:  reports that he has been smoking  cigarettes. He has never used smokeless tobacco. He reports that he does not currently use alcohol after a past usage of about 12.0 standard drinks of alcohol per week. He reports that he does not currently use drugs after having used the following drugs: IV.  Allergies: No Known Allergies  Medications: I have reviewed the patient's current medications.  Results for orders placed or performed during the hospital encounter of 09/15/23 (from the past 48 hours)  Basic metabolic panel     Status: Abnormal   Collection Time: 09/15/23 10:34 AM  Result Value Ref Range   Sodium 140 135 - 145 mmol/L   Potassium 4.4 3.5 - 5.1 mmol/L   Chloride 101 98 - 111 mmol/L   CO2 29 22 - 32 mmol/L   Glucose, Bld 64 (L) 70 - 99 mg/dL    Comment: Glucose reference range applies only to samples taken after fasting for at least 8 hours.   BUN 22 (H) 6 - 20 mg/dL   Creatinine, Ser 9.37 0.61 - 1.24 mg/dL   Calcium 9.6 8.9 - 89.6 mg/dL   GFR, Estimated >39 >39 mL/min    Comment: (NOTE) Calculated using the CKD-EPI Creatinine Equation (2021)    Anion gap 10 5 - 15    Comment: Performed at [facility], [address], [place], [place] [address]  CBC with Differential     Status: Abnormal   Collection Time: 09/15/23 10:34 AM  Result Value Ref Range  WBC 6.7 4.0 - 10.5 K/uL   RBC 4.31 4.22 - 5.81 MIL/uL   Hemoglobin 11.5 (L) 13.0 - 17.0 g/dL   HCT 61.1 (L) 60.9 - 47.9 %   MCV 90.0 80.0 - 100.0 fL   MCH 26.7 26.0 - 34.0 pg   MCHC 29.6 (L) 30.0 - 36.0 g/dL   RDW 84.2 (H) 88.4 - 84.4 %   Platelets 322 150 - 400 K/uL   nRBC 0.0 0.0 - 0.2 %   Neutrophils Relative % 86 %   Neutro Abs 5.7 1.7 - 7.7 K/uL   Lymphocytes Relative 6 %   Lymphs Abs 0.4 (L) 0.7 - 4.0 K/uL   Monocytes Relative 7 %   Monocytes Absolute 0.5 0.1 - 1.0 K/uL   Eosinophils Relative 0 %   Eosinophils Absolute 0.0 0.0 - 0.5 K/uL   Basophils Relative 1 %   Basophils Absolute 0.1 0.0 - 0.1 K/uL   Immature Granulocytes 0  %   Abs Immature Granulocytes 0.02 0.00 - 0.07 K/uL    Comment: Performed at [facility], [address] [place], [place] [address]    CT Hip Right Wo Contrast Result Date: 09/15/2023 CLINICAL DATA:  Fall.  Proximal femoral fracture. EXAM: CT OF THE RIGHT HIP WITHOUT CONTRAST TECHNIQUE: Multidetector CT imaging of the right hip was performed according to the standard protocol. Multiplanar CT image reconstructions were also generated. RADIATION DOSE REDUCTION: This exam was performed according to the departmental dose-optimization program which includes automated exposure control, adjustment of the mA and/or kV according to patient size and/or use of iterative reconstruction technique. COMPARISON:  Same-day pelvic radiograph dated 09/15/2023. FINDINGS: Bones/Joint/Cartilage Acute comminuted subcapital fracture of the right femoral head and neck junction with approximately 1.5 cm of lateral displacement of the distal fracture component and 2.8 cm of impaction. No intertrochanteric extension. The right femoral head is seated within the acetabulum. Nondisplaced fracture of the right inferior pubic ramus demonstrates partial sclerotic margins with suspected marginal callus formation, suggestive of a subacute to chronic fracture (series 5, images 16-18). Pubic symphysis is anatomically aligned. Ligaments Ligaments are suboptimally evaluated by CT. Muscles and Tendons No loculated intramuscular collection. Soft tissue Soft tissue edema along the right hip. No fluid collection. No soft tissue mass. IMPRESSION: 1. Acute comminuted, displaced, and impacted subcapital fracture of the right femoral head and neck junction. 2. Nondisplaced fracture of the right inferior pubic ramus demonstrates partial sclerotic margins with suspected marginal callus formation, suggestive of a subacute to chronic fracture Electronically Signed   By: Harrietta Sherry M.D.   On: 09/15/2023 12:23   DG Pelvis  Portable Result Date: 09/15/2023 CLINICAL DATA:  Hip pain.  Fall 3 days ago. EXAM: PORTABLE PELVIS 1-2 VIEWS COMPARISON:  None Available. FINDINGS: Acute comminuted fracture of the right proximal femoral head and neck junction with superior displacement of the distal fracture component and associated impaction. The right femoral head appears seated in the acetabulum. Evaluation of the sacrum is somewhat limited by overlying bowel-gas. Pubic symphysis is anatomically aligned. IMPRESSION: Acute comminuted, displaced, and foreshortened fracture of the right proximal femoral head and neck junction. Intertrochanteric extension cannot be excluded. Consider CT for further evaluation. Electronically Signed   By: Harrietta Sherry M.D.   On: 09/15/2023 10:53   DG Knee Right Port Result Date: 09/15/2023 CLINICAL DATA:  Fall. EXAM: PORTABLE RIGHT KNEE - 1-2 VIEW COMPARISON:  None Available. FINDINGS: No evidence of fracture, dislocation, or joint effusion. Focus of serpiginous sclerotic change in  the proximal tibial metadiaphysis likely reflects osteonecrosis. No aggressive features. Mild medial femorotibial compartment joint space narrowing. Soft tissues are unremarkable. IMPRESSION: 1. No acute osseous abnormality. 2. Mild medial femorotibial compartment joint space narrowing. 3. Focus of serpiginous sclerotic change in the proximal tibial metadiaphysis likely reflects osteonecrosis. Electronically Signed   By: Harrietta Sherry M.D.   On: 09/15/2023 10:49   DG Chest Portable 1 View Result Date: 09/15/2023 EXAM: 1 VIEW XRAY OF THE CHEST 09/15/2023 10:44:38 AM COMPARISON: 09/03/2022 CLINICAL HISTORY: Fall ?R hip fx. Fall ?R hip fx; Per notes: Patient to ED by POV with c/o fall. Patient states he fell 3 days ago causing pain to right hip. He denies LOC but voices being on thinners, unaware of what thinner he is on. FINDINGS: LUNGS AND PLEURA: Biapical pleural thickening. No focal pulmonary opacity. No pulmonary edema. No  pleural effusion. No pneumothorax. HEART AND MEDIASTINUM: Tracheostomy terminates at the level of the clavicles. No acute abnormality of the cardiac and mediastinal silhouettes. BONES AND SOFT TISSUES: Patient is rotated to the right. Multiple wires and leads project over the chest on the frontal radiograph. No acute osseous abnormality. IMPRESSION: 1. No acute process. 2. Physician degradation Electronically signed by: Rockey Kilts MD 09/15/2023 10:49 AM EDT RP Workstation: [id number]    Review of Systems  HENT:  Negative for ear discharge, ear pain, hearing loss and tinnitus.   Eyes:  Negative for photophobia and pain.  Respiratory:  Negative for cough and shortness of breath.   Cardiovascular:  Negative for chest pain.  Gastrointestinal:  Negative for abdominal pain, nausea and vomiting.  Genitourinary:  Negative for dysuria, flank pain, frequency and urgency.  Musculoskeletal:  Positive for arthralgias (Right hip). Negative for back pain, myalgias and neck pain.  Neurological:  Negative for dizziness and headaches.  Hematological:  Does not bruise/bleed easily.  Psychiatric/Behavioral:  The patient is not nervous/anxious.    Blood pressure (!) 128/92, pulse 88, temperature 99.3 F (37.4 C), temperature source Oral, resp. rate 12, height 5' 10 (1.778 m), weight 43 kg, SpO2 100%. Physical Exam Constitutional:      General: He is not in acute distress.    Appearance: He is well-developed. He is not diaphoretic.  HENT:     Head: Normocephalic and atraumatic.  Eyes:     General: No scleral icterus.       Right eye: No discharge.        Left eye: No discharge.     Conjunctiva/sclera: Conjunctivae normal.  Cardiovascular:     Rate and Rhythm: Normal rate and regular rhythm.  Pulmonary:     Effort: Pulmonary effort is normal. No respiratory distress.  Musculoskeletal:     Cervical back: Normal range of motion.     Comments: RLE No traumatic wounds, ecchymosis, or rash  Mild TTP  hip  No knee or ankle effusion  Knee stable to varus/ valgus and anterior/posterior stress  Sens DPN, SPN, TN intact  Motor EHL, ext, flex, evers 5/5  DP 2+, PT 2+, No significant edema  Skin:    General: Skin is warm and dry.  Neurological:     Mental Status: He is alert.  Psychiatric:        Mood and Affect: Mood normal.        Behavior: Behavior normal.     Assessment/Plan: Right hip fx -- Plan THA Wednesday with Dr. Kendal. Please keep NPO after MN Tuesday. 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 Please hold Eliquis . If need to bridge with heparin  please d/c at [facility] Tuesday night.    Ozell DOROTHA Ned, PA-C Orthopedic Surgery [phone] 09/15/2023, 3:50 PM

## 2023-09-16 DIAGNOSIS — Z930 Tracheostomy status: Secondary | ICD-10-CM

## 2023-09-16 DIAGNOSIS — Z8679 Personal history of other diseases of the circulatory system: Secondary | ICD-10-CM

## 2023-09-16 DIAGNOSIS — Z8521 Personal history of malignant neoplasm of larynx: Secondary | ICD-10-CM

## 2023-09-16 DIAGNOSIS — Z8619 Personal history of other infectious and parasitic diseases: Secondary | ICD-10-CM

## 2023-09-16 DIAGNOSIS — Z79891 Long term (current) use of opiate analgesic: Secondary | ICD-10-CM

## 2023-09-16 DIAGNOSIS — Z86718 Personal history of other venous thrombosis and embolism: Secondary | ICD-10-CM

## 2023-09-16 DIAGNOSIS — E43 Unspecified severe protein-calorie malnutrition: Secondary | ICD-10-CM

## 2023-09-16 DIAGNOSIS — G894 Chronic pain syndrome: Secondary | ICD-10-CM

## 2023-09-16 LAB — PHOSPHORUS: Phosphorus: 2.7 mg/dL (ref 2.5–4.6)

## 2023-09-16 LAB — BASIC METABOLIC PANEL WITH GFR
Anion gap: 12 (ref 5–15)
BUN: 15 mg/dL (ref 6–20)
CO2: 23 mmol/L (ref 22–32)
Calcium: 9 mg/dL (ref 8.9–10.3)
Chloride: 103 mmol/L (ref 98–111)
Creatinine, Ser: 0.61 mg/dL (ref 0.61–1.24)
GFR, Estimated: >60 mL/min (ref >60)
Glucose, Bld: 68 mg/dL — ABNORMAL LOW (ref 70–99)
Potassium: 3.9 mmol/L (ref 3.5–5.1)
Sodium: 138 mmol/L (ref 135–145)

## 2023-09-16 LAB — CBC
HCT: 33.7 % — ABNORMAL LOW (ref 39.0–52.0)
Hemoglobin: 10.8 g/dL — ABNORMAL LOW (ref 13.0–17.0)
MCH: 27.6 pg (ref 26.0–34.0)
MCHC: 32 g/dL (ref 30.0–36.0)
MCV: 86 fL (ref 80.0–100.0)
Platelets: 301 K/uL (ref 150–400)
RBC: 3.92 MIL/uL — ABNORMAL LOW (ref 4.22–5.81)
RDW: 15.7 % — ABNORMAL HIGH (ref 11.5–15.5)
WBC: 7.1 K/uL (ref 4.0–10.5)
nRBC: 0 % (ref 0.0–0.2)

## 2023-09-16 LAB — SURGICAL PCR SCREEN
MRSA, PCR: POSITIVE — AB
Staphylococcus aureus: POSITIVE — AB

## 2023-09-16 LAB — VITAMIN D 25 HYDROXY (VIT D DEFICIENCY, FRACTURES): Vit D, 25-Hydroxy: 11.67 ng/mL — ABNORMAL LOW (ref 30–100)

## 2023-09-16 LAB — HIV ANTIBODY (ROUTINE TESTING W REFLEX): HIV Screen 4th Generation wRfx: NONREACTIVE

## 2023-09-16 MED ORDER — CHLORHEXIDINE GLUCONATE 4 % EX SOLN
60.0000 mL | Freq: Once | CUTANEOUS | Status: AC
  Administered 2023-09-17: 4 via TOPICAL

## 2023-09-16 MED ORDER — CEFAZOLIN SODIUM-DEXTROSE 2-4 GM/100ML-% IV SOLN
2.0000 g | INTRAVENOUS | Status: AC
  Administered 2023-09-17: 2 g via INTRAVENOUS
  Filled 2023-09-16: qty 100

## 2023-09-16 MED ORDER — VITAMIN D (ERGOCALCIFEROL) 1.25 MG (50000 UNIT) PO CAPS
50000.0000 [IU] | ORAL_CAPSULE | ORAL | Status: DC
  Administered 2023-09-16: 50000 [IU] via ORAL
  Filled 2023-09-16: qty 1

## 2023-09-16 MED ORDER — POLYETHYLENE GLYCOL 3350 17 G PO PACK
17.0000 g | PACK | Freq: Every day | ORAL | Status: DC
  Administered 2023-09-16 – 2023-09-19 (×4): 17 g via ORAL
  Filled 2023-09-16 (×4): qty 1

## 2023-09-16 MED ORDER — GUAIFENESIN ER 600 MG PO TB12: 600.0000 mg | ORAL_TABLET | Freq: Two times a day (BID) | ORAL | Status: DC | PRN

## 2023-09-16 MED ORDER — POVIDONE-IODINE 10 % EX SWAB
2.0000 | Freq: Once | CUTANEOUS | Status: AC
  Administered 2023-09-17: 2 via TOPICAL

## 2023-09-16 MED ORDER — DEXTROSE IN LACTATED RINGERS 5 % IV SOLN: INTRAVENOUS | Status: AC

## 2023-09-16 MED ORDER — METHADONE HCL 10 MG/ML PO CONC
130.0000 mg | Freq: Every day | ORAL | Status: DC
  Administered 2023-09-17 – 2023-09-19 (×3): 130 mg via ORAL
  Filled 2023-09-16 (×3): qty 15

## 2023-09-16 MED ORDER — ACETAMINOPHEN 650 MG RE SUPP: 650.0000 mg | Freq: Three times a day (TID) | RECTAL | Status: DC

## 2023-09-16 MED ORDER — TRANEXAMIC ACID-NACL 1000-0.7 MG/100ML-% IV SOLN
1000.0000 mg | INTRAVENOUS | Status: AC
  Administered 2023-09-17: 1000 mg via INTRAVENOUS
  Filled 2023-09-16: qty 100

## 2023-09-16 MED ORDER — ACETAMINOPHEN 325 MG PO TABS
650.0000 mg | ORAL_TABLET | Freq: Three times a day (TID) | ORAL | Status: DC
  Administered 2023-09-16 – 2023-09-19 (×10): 650 mg via ORAL
  Filled 2023-09-16 (×10): qty 2

## 2023-09-16 MED ORDER — LIDOCAINE VISCOUS HCL 2 % MT SOLN
15.0000 mL | Freq: Four times a day (QID) | OROMUCOSAL | Status: DC | PRN
  Administered 2023-09-16 – 2023-09-19 (×6): 15 mL via OROMUCOSAL
  Filled 2023-09-16 (×7): qty 15

## 2023-09-16 MED ORDER — ENOXAPARIN SODIUM 30 MG/0.3ML IJ SOSY
30.0000 mg | PREFILLED_SYRINGE | INTRAMUSCULAR | Status: AC
  Administered 2023-09-16: 30 mg via SUBCUTANEOUS
  Filled 2023-09-16: qty 0.3

## 2023-09-16 NOTE — Progress Notes (Signed)
 HD#1 SUBJECTIVE:  Patient Summary: David [name] is a 48 y.o. 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   Overnight Events: none  Interim History: Patient is awake and able to communicate during the exam.  Patient is in a lot of pain, states he is a previous opioid user and that is his baseline.  Discussed the pain regimen we have in place including Dilaudid  and oxycodone  in addition to his home medications.  Patient reassured that he was given his methadone  and pregabalin  this morning.  Patient notes a new cough and difficulty drinking water  which he has not had before coming to the hospital.  He has not been able to eat meals because of this new throat discomfort.  Discussed that we will get a speech evaluation to assess and see what we can do to help.  OBJECTIVE:  Vital Signs: Vitals:   09/15/23 2343 09/16/23 0246 09/16/23 0353 09/16/23 0410  BP:  129/80  110/83  Pulse: 79 85 90 87  Resp: 19  20 19   Temp:    99 F (37.2 C)  TempSrc:    Oral  SpO2: 98% 97% 97% 100%  Weight:      Height:       Supplemental O2:   SpO2: 100 % O2 Flow Rate (L/min): 6 L/min FiO2 (%): 28 %  Filed Weights   09/15/23 0920  Weight: 43 kg    No intake or output data in the 24 hours ending 09/16/23 0704 Net IO Since Admission: No IO data has been entered for this period [09/16/23 0704]  Physical Exam: Physical Exam Vitals reviewed.  HENT:     Head:     Comments: Right torticollis    Mouth/Throat:     Mouth: Mucous membranes are moist.     Comments: Tracheostomy in place, not attached to ventilator. Patient able to communicate Eyes:     Conjunctiva/sclera: Conjunctivae normal.  Cardiovascular:     Rate and Rhythm: Normal rate and regular rhythm.      Heart sounds: Normal heart sounds.     Comments: BL +DP BL LE warm Abdominal:     General: Bowel sounds are normal.     Palpations: Abdomen is soft.  Musculoskeletal:        General: No swelling.     Right lower leg: No edema.     Left lower leg: No edema.  Skin:    General: Skin is warm.  Neurological:     Mental Status: He is alert and oriented to person, place, and time.  Psychiatric:        Mood and Affect: Mood normal.     Patient Lines/Drains/Airways Status     Active Line/Drains/Airways     Name Placement date Placement time Site Days   Peripheral IV 12/13/22 20 G 1.88 Left;Anterior Forearm 12/13/22  1407  Forearm  277   Peripheral IV 09/15/23 20 G Right Antecubital 09/15/23  1333  Antecubital  1   Incision - 3 Ports Abdomen 1: Right;Lateral 2: Right;Lateral;Medial 3: Umbilicus 06/20/21  0805  -- 818   Tracheostomy Shiley Flexible 4 mm Uncuffed 08/15/21  1540  4 mm  762   Tracheostomy Shiley Flexible 4 mm Uncuffed 12/13/22  1302  4 mm  277   Tracheostomy  7 mm Uncuffed 09/15/23  1630  7 mm  1   Wound / Incision (Open or Dehisced) 09/14/21 Puncture Neck Right cervical lymph node bx site 09/14/21  1438  Neck  732            Pertinent labs and imaging:      Latest Ref Rng & Units 09/15/2023   10:34 AM 12/13/2022    2:10 PM 11/13/2022    3:40 PM  CBC  WBC 4.0 - 10.5 K/uL 6.7  4.8  4.0   Hemoglobin 13.0 - 17.0 g/dL 88.4  9.6  88.5   Hematocrit 39.0 - 52.0 % 38.8  31.2  35.6   Platelets 150 - 400 K/uL 322  108  160        Latest Ref Rng & Units 09/15/2023   10:34 AM 12/13/2022    2:10 PM 11/29/2022   10:00 AM  CMP  Glucose 70 - 99 mg/dL 64  872  92   BUN 6 - 20 mg/dL 22  16  12    Creatinine 0.61 - 1.24 mg/dL 9.37  9.24  9.14   Sodium 135 - 145 mmol/L 140  135  135   Potassium 3.5 - 5.1 mmol/L 4.4  3.4  4.0   Chloride 98 - 111 mmol/L 101  101  96   CO2 22 - 32 mmol/L 29  28  26    Calcium 8.9 - 10.3 mg/dL 9.6  8.7  9.1   Total Protein 6.5 - 8.1 g/dL    7.1   Total Bilirubin 0.3 - 1.2 mg/dL   0.3   Alkaline Phos 38 - 126 U/L   67   AST 15 - 41 U/L   31   ALT 0 - 44 U/L   17     CT Hip Right Wo Contrast Result Date: 09/15/2023 CLINICAL DATA:  Fall.  Proximal femoral fracture. EXAM: CT OF THE RIGHT HIP WITHOUT CONTRAST TECHNIQUE: Multidetector CT imaging of the right hip was performed according to the standard protocol. Multiplanar CT image reconstructions were also generated. RADIATION DOSE REDUCTION: This exam was performed according to the departmental dose-optimization program which includes automated exposure control, adjustment of the mA and/or kV according to patient size and/or use of iterative reconstruction technique. COMPARISON:  Same-day pelvic radiograph dated 09/15/2023. FINDINGS: Bones/Joint/Cartilage Acute comminuted subcapital fracture of the right femoral head and neck junction with approximately 1.5 cm of lateral displacement of the distal fracture component and 2.8 cm of impaction. No intertrochanteric extension. The right femoral head is seated within the acetabulum. Nondisplaced fracture of the right inferior pubic ramus demonstrates partial sclerotic margins with suspected marginal callus formation, suggestive of a subacute to chronic fracture (series 5, images 16-18). Pubic symphysis is anatomically aligned. Ligaments Ligaments are suboptimally evaluated by CT. Muscles and Tendons No loculated intramuscular collection. Soft tissue Soft tissue edema along the right hip. No fluid collection. No soft tissue mass. IMPRESSION: 1. Acute comminuted, displaced, and impacted subcapital fracture of the right femoral head and neck junction. 2. Nondisplaced fracture of the right inferior pubic ramus demonstrates partial sclerotic margins with suspected marginal callus formation, suggestive of a subacute to chronic fracture Electronically Signed   By: Harrietta Sherry M.D.   On: 09/15/2023 12:23   DG Pelvis Portable Result Date:  09/15/2023 CLINICAL DATA:  Hip pain.  Fall 3 days ago. EXAM: PORTABLE PELVIS 1-2 VIEWS COMPARISON:  None Available. FINDINGS: Acute comminuted fracture of the right proximal femoral head and neck junction with  superior displacement of the distal fracture component and associated impaction. The right femoral head appears seated in the acetabulum. Evaluation of the sacrum is somewhat limited by overlying bowel-gas. Pubic symphysis is anatomically aligned. IMPRESSION: Acute comminuted, displaced, and foreshortened fracture of the right proximal femoral head and neck junction. Intertrochanteric extension cannot be excluded. Consider CT for further evaluation. Electronically Signed   By: Harrietta Sherry M.D.   On: 09/15/2023 10:53   DG Knee Right Port Result Date: 09/15/2023 CLINICAL DATA:  Fall. EXAM: PORTABLE RIGHT KNEE - 1-2 VIEW COMPARISON:  None Available. FINDINGS: No evidence of fracture, dislocation, or joint effusion. Focus of serpiginous sclerotic change in the proximal tibial metadiaphysis likely reflects osteonecrosis. No aggressive features. Mild medial femorotibial compartment joint space narrowing. Soft tissues are unremarkable. IMPRESSION: 1. No acute osseous abnormality. 2. Mild medial femorotibial compartment joint space narrowing. 3. Focus of serpiginous sclerotic change in the proximal tibial metadiaphysis likely reflects osteonecrosis. Electronically Signed   By: Harrietta Sherry M.D.   On: 09/15/2023 10:49   DG Chest Portable 1 View Result Date: 09/15/2023 EXAM: 1 VIEW XRAY OF THE CHEST 09/15/2023 10:44:38 AM COMPARISON: 09/03/2022 CLINICAL HISTORY: Fall ?R hip fx. Fall ?R hip fx; Per notes: Patient to ED by POV with c/o fall. Patient states he fell 3 days ago causing pain to right hip. He denies LOC but voices being on thinners, unaware of what thinner he is on. FINDINGS: LUNGS AND PLEURA: Biapical pleural thickening. No focal pulmonary opacity. No pulmonary edema. No pleural effusion. No  pneumothorax. HEART AND MEDIASTINUM: Tracheostomy terminates at the level of the clavicles. No acute abnormality of the cardiac and mediastinal silhouettes. BONES AND SOFT TISSUES: Patient is rotated to the right. Multiple wires and leads project over the chest on the frontal radiograph. No acute osseous abnormality. IMPRESSION: 1. No acute process. 2. 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  Patient was initially admitted [facility] but required transfer to [facility] for definitive procedure.   #Acute comminuted, displaced, and impacted subcapital fracture of right femoral head and neck junction Was not using assistive devices prior to this fall 3 weeks ago.  Calcium and phosphorus are normal although vitamin D25 hydroxy 11.67.  Will supplement vitamin D  while in hospital and continue on discharge, will follow-up for ground-level fall and fracture, osteoporosis, in outpatient setting. THA with Dr. Keturah is scheduled for today.  Order for n.p.o. tonight placed.  Last dose of enoxaparin  bridge scheduled for tonight at 1800.  Patient in pain, seems to be a combination of chronic pain as well as right hip pain.  Patient encouraged surgery will be the best treatment for right hip pain.  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  He is producing sputum. Have benzocaine  mouth spray ordered as needed for pain.  Ordered D5 and LR to increase hydration and calories given surgery tomorrow and potential diminished oral intake today.  Also placed order for SLP evaluation. - RT following for trach care, plan to place new tracheostomy prior to discharge   #Chronic pain syndrome Patient follows palliative care for chronic pain.  He is currently on methadone  at home.  His current dose includes 130 mg of methadone  daily as well as pregabalin  50 mg 3 times daily.  Discussed pain regimen with patient today. 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 He follows with Dr. Overton of ID.  Under milligrams of fluconazole  daily, continue indefinitely. -Resume home fluconazole  400 mg daily   #History of right IJ vein occlusion Patient has a past medical history of right IJ vein occlusion.  This was diagnosed on admission in 09/04/2022.  Patient encouraged to be on Eliquis  indefinitely.  In anticipation of surgery, we will hold Eliquis  and bridged with Lovenox . - Hold Eliquis  - Bridged with Lovenox  30 mg  Best Practice: Diet: Regular diet IVF: Fluids: D5-LR, VTE: enoxaparin  (LOVENOX ) injection 40 mg Start: 09/16/23 1800 Code: Full  Disposition planning: Therapy Recs: Pending, DME: other pending Family Contact: mother, to be notified. DISPO: Anticipated discharge in 2-3 days to pending clinical improvement  Signature:  Viktoria Charmayne [facility]  7:04 AM, 09/16/2023  On Call pager [phone]

## 2023-09-16 NOTE — Progress Notes (Signed)
 Discussed logistics of airway w/ RT team.   Post procedure 2 options. #1) after he wakes up and no longer needs cuffed airway place the one he had back in. #2) would be reasonable to just place a new # 7 cuffless portex in trach stoma after he wakes up (without modifying it by cutting length and adding the shiley inner cannula). I can always change him to his normal set up prior to discharge or on follow up in clinic.

## 2023-09-16 NOTE — Evaluation (Signed)
 Clinical/Bedside Swallow Evaluation Patient Details  Name: David [name] MRN: [id number] Date of Birth: 1975/07/25  Today's Date: 09/16/2023 Time: SLP Start Time (ACUTE ONLY): 1450 SLP Stop Time (ACUTE ONLY): 1515 SLP Time Calculation (min) (ACUTE ONLY): 25 min  Past Medical History:  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)    Pneumococcal bacteremia 06/09/2021   Pneumothorax    Left lung spontaneous pneumothorax at age 45 yr    S/P percutaneous endoscopic gastrostomy (PEG) tube placement (HCC) 06/24/2021   Past Surgical History:  Past Surgical History:  Procedure Laterality Date   chest tubes Left    IR GASTROSTOMY TUBE MOD SED  06/18/2021   LAPAROSCOPIC INSERTION GASTROSTOMY TUBE N/A 06/20/2021   Procedure: LAPAROSCOPIC INSERTION GASTROSTOMY TUBE;  Surgeon: Signe Mitzie LABOR, MD;  Location: [facility] OR;  Service: General;  Laterality: N/A;  DOW PATIENT   TRACHEOSTOMY TUBE PLACEMENT N/A 06/09/2021   Procedure: AWAKE TRACHEOSTOMY;  Surgeon: Carlie Clark, MD;  [facility]: [facility];  Service: ENT;  Laterality: N/A;   HPI:  David [name] is a 48 year old male with hx of stage IVB laryngeal cancer who was admitted for right hip surgery three weeks after a fall, after which he was crawling to get around his apartment, hoping the pain would subside. He is well-known to SLP service at [facility] and [facility].  He is s/p chemo and XRT.  PMHx is significant for adult FTT, torticollis with right ear touching  right shoulder, limited ROM of neck and dependent lymphedema right face, mild stage 1 pressure/breakdown to left of trach stoma. Neck contraction has worsened over the last year to the point where he tried OP PT but with little benefit.  He is followed by Jeralyn Banner, PCCM, at trach clinic, who has had to make significant manipulations/adaptations to his tracheostomy to avoid skin breakdown from the flanges.  He is prone to mucous plugging. Uses a PMV.  not a candidate for decannulation.   Hx trach 06/09/21, PEG 06/20/21, subsequently fell out per his report. 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  Quality of imaging was compromised by shoulder/neck positioning and it was difficult to visualize anatomical markers. There were no incidents of nasal regurgitation on today's study (reported to be happening more frequently). There was minimal mobility of the larynx and incomplete laryngeal closure, leading to intermittent aspiration of thin and nectar thick liquids, occurring before and after the swallow response. There was frequent tongue pumping and effort to propel material from mouth through pharynx, leaving residuals in posterior oral cavity with multiple f/u swallows needed to move bolus into throat. There was minimal pharyngeal residue post-swallow. OP SLP to tx dysphagia from 9/23-12/23 and again from 01/14/23-03/04/23. He was managing to eat regular solids/thin liquids.  He is adamant that he will not consider PEG again.    Assessment / Plan / Recommendation  Clinical Impression  Pt known to this SLP - he presents with chronic dysphagia,  for which he has had several rounds of OP treatment, the last of which ended in Feb of this year. Unfortunately his dysphagia is related to radiation and complicated by severe torticollis.  He aspirates most liquids historically and has managed to continue PO intake without  developing pna or other respiratory complications. However, he continues to lose weight and adamantly refuses to consider another G-tube. Today he stated his swallowing has deteriorated.  He was observed to have ongoing coughing/discomfort with most PO intake; thin liquids occasionally lead to nasal regurgitation.  Positioning and behavioral strategies have little impact.   He has supplements available at home but his mother states he rarely drinks them.  I don't know that further MBS studies will offer any benefit - we know he continues to aspirate and there is little that can be done to improve the swallow.  He would benefit from further discussions, encouragement to reconsider PEG.   Known/trusted entities within the medical community- with whom he has established relationships- may have the most impact.    For now, continue current diet - SLP will follow while admitted.  SLP Visit Diagnosis: Dysphagia, pharyngeal phase (R13.13)    Aspiration Risk  Moderate aspiration risk    Diet Recommendation   Thin;Age appropriate regular  Medication Administration: Crushed with puree    Other  Recommendations Oral Care Recommendations: Oral care BID     Assistance Recommended at Discharge    Functional Status Assessment    Frequency and Duration min 2x/week  1 week       Prognosis Prognosis for improved oropharyngeal function: Guarded Barriers to Reach Goals: Severity of deficits      Swallow Study   General HPI: David [name] is a 48 year old male with hx of stage IVB laryngeal cancer who was admitted for right hip surgery three weeks after a fall, after which he was crawling to get around his apartment, hoping the pain would subside. He is well-known to SLP service at [facility] and [facility].  He is s/p chemo and XRT.  PMHx is significant for adult FTT, torticollis with right ear touching right shoulder, limited ROM of neck and dependent lymphedema right face, mild stage 1 pressure/breakdown to  left of trach stoma. Neck contraction has worsened over the last year to the point where he tried OP PT but with little benefit.  He is followed by Jeralyn Banner, PCCM, at trach clinic, who has had to make significant manipulations/adaptations to his tracheostomy to avoid skin breakdown from the flanges.  He is prone to mucous plugging. Uses a PMV.  not a candidate for decannulation.   Hx trach 06/09/21, PEG 06/20/21, subsequently fell out per his report. 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  Quality of imaging was compromised by shoulder/neck positioning and it was difficult to visualize anatomical markers. There were no incidents of nasal regurgitation on today's study (reported to be happening more frequently). There was minimal mobility of the larynx and incomplete laryngeal closure, leading to intermittent aspiration of thin and nectar thick liquids, occurring before and after the swallow response. There was frequent tongue pumping and effort to propel material from mouth through pharynx, leaving residuals in posterior oral cavity with multiple f/u swallows needed to move bolus into throat. There was minimal pharyngeal residue post-swallow. OP SLP to tx dysphagia from 9/23-12/23 and again from 01/14/23-03/04/23. He was managing to eat regular solids/thin liquids.  He is  adamant that he will not consider PEG again. Type of Study: Bedside Swallow Evaluation Previous Swallow Assessment: see HPI Diet Prior to this Study: Regular;Thin liquids (Level 0) Temperature Spikes Noted: No Respiratory Status: Trach History of Recent Intubation: No Behavior/Cognition: Alert;Cooperative;Pleasant mood Oral Cavity Assessment: Within Functional Limits Oral Care Completed by SLP: No Oral Cavity - Dentition: Poor condition;Missing dentition Vision: Functional for self-feeding Self-Feeding Abilities: Able to feed  self;Needs set up Patient Positioning: Upright in bed Baseline Vocal Quality: Hoarse;Low vocal intensity (PMV) Volitional Cough: Congested;Wet Volitional Swallow: Able to elicit    Oral/Motor/Sensory Function     Ice Chips Ice chips: Not tested   Thin Liquid Thin Liquid: Impaired Presentation: Straw Pharyngeal  Phase Impairments: Cough - Immediate Other Comments: nasal regurgitation    Nectar Thick Nectar Thick Liquid: Impaired Presentation: Straw Pharyngeal Phase Impairments: Cough - Immediate   Honey Thick Honey Thick Liquid: Not tested   Puree Puree: Impaired Presentation: Spoon Pharyngeal Phase Impairments: Cough - Delayed   Solid     Solid: Impaired Pharyngeal Phase Impairments: Cough - Delayed      Vona Palma Laurice 09/16/2023,3:48 PM  Nzinga Ferran L. Vona, MA CCC/SLP Clinical Specialist - Acute Care SLP Acute Rehabilitation Services Office number [phone]

## 2023-09-16 NOTE — Progress Notes (Signed)
 Patient sleeping on room air, sats 88%. RT placed patient on ATC 6L 28%, sats improved to 93%. Mom at bedside.

## 2023-09-16 NOTE — TOC Initial Note (Signed)
 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  Pt is from home alone. PTA independent with ADL's. Supportive mom. Mom provides transportation to provider appointments.    [facility] pt's trach.  Pt active  with [facility] for Methadone .  Plan: THA Wednesday with Dr. Kendal SINNING team following and will assist with needs....       Barriers to Discharge: Continued Medical Work up   Patient Goals and CMS Choice            Expected Discharge Plan and Services   Discharge Planning Services: CM Consult   Living arrangements for the past 2 months: Apartment                                      Prior Living Arrangements/Services Living arrangements for the past 2 months: Apartment Lives with:: Self Patient language and need for interpreter reviewed:: Yes Do you feel safe going back to the place where you live?: Yes      Need for Family Participation in Patient Care: Yes (Comment) Care giver support system in place?: No (comment)   Criminal Activity/Legal Involvement Pertinent to Current Situation/Hospitalization: No - Comment as needed  Activities of Daily Living      Permission Sought/Granted                  Emotional Assessment       Orientation: : Oriented to Self, Oriented to Place, Oriented to  Time Alcohol / Substance Use: Not Applicable    Admission diagnosis:  Closed right hip fracture, initial encounter (HCC) [S72.001A] Closed fracture of right femur, unspecified fracture  morphology, unspecified portion of femur, initial encounter (HCC) [S72.91XA] Patient Active Problem List   Diagnosis Date Noted   Severe protein-calorie malnutrition (HCC) 09/16/2023   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   Closed right hip fracture, initial encounter (HCC) 09/15/2023   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 04/03/2023   Difficulty urinating 03/27/2023   Subclinical hypothyroidism 02/10/2023   FTT (failure to thrive) in adult 12/31/2022   Dysphagia, unspecified 12/31/2022   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 12/31/2022   Hepatitis A immune 11/14/2022   End of life care 10/23/2022   Acute purulent bronchitis 10/23/2022   Hepatitis C 10/09/2022   Left leg pain 10/09/2022   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   History of pancytopenia 09/12/2022   Torticollis 07/04/2022   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   Defective dental restoration 12/31/2021   Chronic periodontitis 12/31/2021   Impacted third molar tooth 12/31/2021   Irritant contact dermatitis associated with digestive stoma 12/25/2021   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   Continuous tobacco abuse 11/21/2021   Dermatitis associated with moisture 10/16/2021   Back pain 08/16/2021   Candidal endocarditis    History of radiation to head and neck region 07/11/2021  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   Dysgeusia 07/11/2021   Pressure injury of skin 06/17/2021   Tracheostomy status (HCC)    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   Heart failure with reduced ejection fraction (HCC) 04/28/2021   Anxiety 04/28/2021   Elevated troponin 04/01/2021   Elevated transaminase level 04/01/2021   Weight loss, unintentional 01/15/2021   Protein-calorie malnutrition, severe (HCC) 01/10/2021   Hypotension 01/08/2021   Chemotherapy induced nausea and vomiting 01/01/2021   Encounter for dental examination 12/13/2020   Laryngeal cancer  (HCC) 12/04/2020   Teeth missing 12/04/2020   Radiation caries 12/04/2020   Retained tooth root 12/04/2020   Chronic apical periodontitis 12/04/2020   Accretions on teeth 12/04/2020   PCP:  Kandis Perkins, DO Pharmacy:   [phone] - [place], [place] - [address] AT [facility] OF [address] [place] [address] Phone: [phone] Fax: [phone]  [id number] - [place], [place] - [address] [place] [address] Phone: [phone] Fax: [phone]  [id number] - [address], [place] - [address] [place] [address] Phone: [phone] Fax: [phone]     Social Drivers of Health (SDOH) Social History: SDOH Screenings   Food Insecurity: Patient Declined (09/15/2023)  Housing: Low Risk  (09/15/2023)  Transportation Needs: Patient Declined (09/15/2023)  Utilities: Patient Declined (09/15/2023)  Alcohol Screen: Low Risk  (03/19/2022)  Depression (PHQ2-9): Low Risk  (11/13/2022)  Recent Concern: Depression (PHQ2-9) - Medium Risk (09/12/2022)  Financial Resource Strain: Low Risk  (03/19/2022)  Physical Activity: Insufficiently Active (03/19/2022)  Social Connections: Socially Isolated (03/19/2022)  Stress: No Stress Concern Present (03/19/2022)  Tobacco Use: High Risk (08/19/2023)   SDOH Interventions: Food Insecurity Interventions: Intervention Not Indicated Housing Interventions: Intervention Not Indicated Transportation Interventions: Intervention Not Indicated Utilities Interventions: Intervention Not Indicated   Readmission Risk Interventions     No data to display

## 2023-09-16 NOTE — TOC CAGE-AID Note (Signed)
 Transition of Care [facility]) - CAGE-AID Screening   Patient Details  Name: David [name] MRN: [id number] Date of Birth: 05/09/1975  Transition of Care [facility]) CM/SW Contact:    Analiz Tvedt E Dory Demont, LCSW Phone Number: 09/16/2023, 1:37 PM   Clinical Narrative: Reports history of drug use, currently on methadone . Denies alcohol use.   CAGE-AID Screening:    Have You Ever Felt You Ought to Cut Down on Your Drinking or Drug Use?: No Have People Annoyed You By Critizing Your Drinking Or Drug Use?: No Have You Felt Bad Or Guilty About Your Drinking Or Drug Use?: No Have You Ever Had a Drink or Used Drugs First Thing In The Morning to Steady Your Nerves or to Get Rid of a Hangover?: No CAGE-AID Score: 0  Substance Abuse Education Offered: No

## 2023-09-17 ENCOUNTER — Encounter (HOSPITAL_COMMUNITY)
Admission: EM | Disposition: A | Discharge status: Home / Self Care | Payer: Self-pay | Source: Home / Self Care | Attending: Internal Medicine

## 2023-09-17 ENCOUNTER — Inpatient Hospital Stay (HOSPITAL_COMMUNITY): Payer: MEDICAID

## 2023-09-17 ENCOUNTER — Encounter (HOSPITAL_COMMUNITY): Payer: Self-pay | Admitting: Internal Medicine

## 2023-09-17 ENCOUNTER — Inpatient Hospital Stay (HOSPITAL_COMMUNITY): Payer: MEDICAID | Admitting: Certified Registered Nurse Anesthetist

## 2023-09-17 DIAGNOSIS — F419 Anxiety disorder, unspecified: Secondary | ICD-10-CM | POA: Diagnosis not present

## 2023-09-17 DIAGNOSIS — S72001A Fracture of unspecified part of neck of right femur, initial encounter for closed fracture: Secondary | ICD-10-CM | POA: Diagnosis not present

## 2023-09-17 DIAGNOSIS — Z930 Tracheostomy status: Secondary | ICD-10-CM

## 2023-09-17 HISTORY — PX: TOTAL HIP ARTHROPLASTY: SHX124

## 2023-09-17 LAB — BASIC METABOLIC PANEL WITH GFR
Anion gap: 11 (ref 5–15)
BUN: 17 mg/dL (ref 6–20)
CO2: 28 mmol/L (ref 22–32)
Calcium: 9.3 mg/dL (ref 8.9–10.3)
Chloride: 102 mmol/L (ref 98–111)
Creatinine, Ser: 0.71 mg/dL (ref 0.61–1.24)
GFR, Estimated: >60 mL/min (ref >60)
Glucose, Bld: 87 mg/dL (ref 70–99)
Potassium: 3.6 mmol/L (ref 3.5–5.1)
Sodium: 141 mmol/L (ref 135–145)

## 2023-09-17 LAB — CBC
HCT: 34.5 % — ABNORMAL LOW (ref 39.0–52.0)
Hemoglobin: 10.7 g/dL — ABNORMAL LOW (ref 13.0–17.0)
MCH: 27.2 pg (ref 26.0–34.0)
MCHC: 31 g/dL (ref 30.0–36.0)
MCV: 87.6 fL (ref 80.0–100.0)
Platelets: 270 K/uL (ref 150–400)
RBC: 3.94 MIL/uL — ABNORMAL LOW (ref 4.22–5.81)
RDW: 15.6 % — ABNORMAL HIGH (ref 11.5–15.5)
WBC: 4.6 K/uL (ref 4.0–10.5)
nRBC: 0 % (ref 0.0–0.2)

## 2023-09-17 LAB — ABO/RH: ABO/RH(D): A POS

## 2023-09-17 LAB — TYPE AND SCREEN
ABO/RH(D): A POS
Antibody Screen: NEGATIVE

## 2023-09-17 SURGERY — ARTHROPLASTY, HIP, TOTAL, ANTERIOR APPROACH
Anesthesia: General | Site: Hip | Laterality: Right

## 2023-09-17 MED ORDER — LACTATED RINGERS IV SOLN: INTRAVENOUS | Status: DC

## 2023-09-17 MED ORDER — DOCUSATE SODIUM 100 MG PO CAPS
100.0000 mg | ORAL_CAPSULE | Freq: Two times a day (BID) | ORAL | Status: DC
  Administered 2023-09-17 – 2023-09-18 (×2): 100 mg via ORAL
  Filled 2023-09-17 (×5): qty 1

## 2023-09-17 MED ORDER — FENTANYL CITRATE (PF) 250 MCG/5ML IJ SOLN
INTRAMUSCULAR | Status: DC | PRN
  Administered 2023-09-17 (×2): 100 ug via INTRAVENOUS
  Administered 2023-09-17: 50 ug via INTRAVENOUS

## 2023-09-17 MED ORDER — SERTRALINE HCL 25 MG PO TABS
25.0000 mg | ORAL_TABLET | Freq: Every day | ORAL | Status: DC
  Administered 2023-09-17 – 2023-09-19 (×3): 25 mg via ORAL
  Filled 2023-09-17 (×3): qty 1

## 2023-09-17 MED ORDER — ROCURONIUM BROMIDE 10 MG/ML (PF) SYRINGE
PREFILLED_SYRINGE | INTRAVENOUS | Status: AC
Start: 2023-09-17 — End: 2023-09-17
  Filled 2023-09-17: qty 30

## 2023-09-17 MED ORDER — PHENOL 1.4 % MT LIQD: 1.0000 | OROMUCOSAL | Status: DC | PRN

## 2023-09-17 MED ORDER — POLYETHYLENE GLYCOL 3350 17 G PO PACK: 17.0000 g | PACK | Freq: Every day | ORAL | Status: DC | PRN

## 2023-09-17 MED ORDER — MUPIROCIN 2 % EX OINT: 1.0000 | TOPICAL_OINTMENT | Freq: Two times a day (BID) | CUTANEOUS | 0 refills | Status: DC

## 2023-09-17 MED ORDER — DEXAMETHASONE SODIUM PHOSPHATE 10 MG/ML IJ SOLN
INTRAMUSCULAR | Status: AC
  Filled 2023-09-17: qty 2

## 2023-09-17 MED ORDER — DEXAMETHASONE SODIUM PHOSPHATE 10 MG/ML IJ SOLN
INTRAMUSCULAR | Status: DC | PRN
  Administered 2023-09-17: 10 mg via INTRAVENOUS

## 2023-09-17 MED ORDER — SUGAMMADEX SODIUM 200 MG/2ML IV SOLN
INTRAVENOUS | Status: DC | PRN
  Administered 2023-09-17: 86 mg via INTRAVENOUS

## 2023-09-17 MED ORDER — MUPIROCIN 2 % EX OINT
1.0000 | TOPICAL_OINTMENT | Freq: Two times a day (BID) | CUTANEOUS | Status: DC
  Administered 2023-09-17 – 2023-09-19 (×4): 1 via NASAL
  Filled 2023-09-17: qty 22

## 2023-09-17 MED ORDER — DIPHENHYDRAMINE HCL 12.5 MG/5ML PO ELIX: 12.5000 mg | ORAL_SOLUTION | ORAL | Status: DC | PRN

## 2023-09-17 MED ORDER — 0.9 % SODIUM CHLORIDE (POUR BTL) OPTIME
TOPICAL | Status: DC | PRN
  Administered 2023-09-17: 1000 mL

## 2023-09-17 MED ORDER — HYDROMORPHONE HCL 1 MG/ML IJ SOLN: 0.2500 mg | INTRAMUSCULAR | Status: DC | PRN

## 2023-09-17 MED ORDER — ONDANSETRON HCL 4 MG/2ML IJ SOLN
INTRAMUSCULAR | Status: DC | PRN
  Administered 2023-09-17: 4 mg via INTRAVENOUS

## 2023-09-17 MED ORDER — LIDOCAINE 2% (20 MG/ML) 5 ML SYRINGE
INTRAMUSCULAR | Status: AC
  Filled 2023-09-17: qty 10

## 2023-09-17 MED ORDER — STERILE WATER FOR IRRIGATION IR SOLN
Status: DC | PRN
  Administered 2023-09-17: 1000 mL

## 2023-09-17 MED ORDER — SODIUM CHLORIDE 0.9 % IV SOLN: INTRAVENOUS | Status: DC

## 2023-09-17 MED ORDER — CHLORHEXIDINE GLUCONATE 0.12 % MT SOLN
OROMUCOSAL | Status: AC
  Administered 2023-09-17: 15 mL via OROMUCOSAL
  Filled 2023-09-17: qty 15

## 2023-09-17 MED ORDER — OXYCODONE HCL 5 MG PO TABS: 5.0000 mg | ORAL_TABLET | Freq: Once | ORAL | Status: DC | PRN

## 2023-09-17 MED ORDER — AMISULPRIDE (ANTIEMETIC) 5 MG/2ML IV SOLN: 10.0000 mg | Freq: Once | INTRAVENOUS | Status: DC | PRN

## 2023-09-17 MED ORDER — PHENYLEPHRINE 80 MCG/ML (10ML) SYRINGE FOR IV PUSH (FOR BLOOD PRESSURE SUPPORT)
PREFILLED_SYRINGE | INTRAVENOUS | Status: AC
  Filled 2023-09-17: qty 10

## 2023-09-17 MED ORDER — CHLORHEXIDINE GLUCONATE CLOTH 2 % EX PADS
6.0000 | MEDICATED_PAD | Freq: Every day | CUTANEOUS | Status: DC
  Administered 2023-09-18 – 2023-09-19 (×2): 6 via TOPICAL

## 2023-09-17 MED ORDER — ROCURONIUM BROMIDE 10 MG/ML (PF) SYRINGE
PREFILLED_SYRINGE | INTRAVENOUS | Status: DC | PRN
  Administered 2023-09-17: 50 mg via INTRAVENOUS

## 2023-09-17 MED ORDER — MAGNESIUM CITRATE PO SOLN: 1.0000 | Freq: Once | ORAL | Status: DC | PRN

## 2023-09-17 MED ORDER — OXYCODONE HCL 5 MG/5ML PO SOLN: 5.0000 mg | Freq: Once | ORAL | Status: DC | PRN

## 2023-09-17 MED ORDER — ORAL CARE MOUTH RINSE: 15.0000 mL | Freq: Once | OROMUCOSAL | Status: AC

## 2023-09-17 MED ORDER — MIDAZOLAM HCL 2 MG/2ML IJ SOLN
INTRAMUSCULAR | Status: DC | PRN
  Administered 2023-09-17: 2 mg via INTRAVENOUS

## 2023-09-17 MED ORDER — HYDROMORPHONE HCL 1 MG/ML IJ SOLN
0.5000 mg | INTRAMUSCULAR | Status: DC | PRN
  Administered 2023-09-17 – 2023-09-18 (×2): 1 mg via INTRAVENOUS
  Filled 2023-09-17 (×2): qty 1

## 2023-09-17 MED ORDER — MIDAZOLAM HCL 2 MG/2ML IJ SOLN
INTRAMUSCULAR | Status: AC
  Filled 2023-09-17: qty 2

## 2023-09-17 MED ORDER — MENTHOL 3 MG MT LOZG: 1.0000 | LOZENGE | OROMUCOSAL | Status: DC | PRN

## 2023-09-17 MED ORDER — CHLORHEXIDINE GLUCONATE 0.12 % MT SOLN: 15.0000 mL | Freq: Once | OROMUCOSAL | Status: AC

## 2023-09-17 MED ORDER — PHENYLEPHRINE HCL-NACL 20-0.9 MG/250ML-% IV SOLN
INTRAVENOUS | Status: DC | PRN
  Administered 2023-09-17: 25 ug/min via INTRAVENOUS

## 2023-09-17 MED ORDER — CHLORHEXIDINE GLUCONATE 4 % EX SOLN
1.0000 | CUTANEOUS | 1 refills | Status: DC
Start: 2023-09-17 — End: 2023-11-24

## 2023-09-17 MED ORDER — BISACODYL 5 MG PO TBEC: 5.0000 mg | DELAYED_RELEASE_TABLET | Freq: Every day | ORAL | Status: DC | PRN

## 2023-09-17 MED ORDER — PHENYLEPHRINE 80 MCG/ML (10ML) SYRINGE FOR IV PUSH (FOR BLOOD PRESSURE SUPPORT)
PREFILLED_SYRINGE | INTRAVENOUS | Status: DC | PRN
  Administered 2023-09-17: 80 ug via INTRAVENOUS
  Administered 2023-09-17 (×2): 160 ug via INTRAVENOUS
  Administered 2023-09-17: 80 ug via INTRAVENOUS

## 2023-09-17 MED ORDER — OXYCODONE HCL 5 MG PO TABS
5.0000 mg | ORAL_TABLET | ORAL | Status: DC | PRN
  Administered 2023-09-18 – 2023-09-19 (×2): 10 mg via ORAL
  Filled 2023-09-17 (×2): qty 2

## 2023-09-17 MED ORDER — FENTANYL CITRATE (PF) 250 MCG/5ML IJ SOLN
INTRAMUSCULAR | Status: AC
  Filled 2023-09-17: qty 5

## 2023-09-17 MED ORDER — PROPOFOL 500 MG/50ML IV EMUL
INTRAVENOUS | Status: DC | PRN
  Administered 2023-09-17: 50 ug/kg/min via INTRAVENOUS

## 2023-09-17 MED ORDER — VANCOMYCIN HCL 1000 MG IV SOLR
INTRAVENOUS | Status: DC | PRN
  Administered 2023-09-17: 1000 mg via TOPICAL

## 2023-09-17 MED ORDER — ONDANSETRON HCL 4 MG/2ML IJ SOLN
INTRAMUSCULAR | Status: AC
  Filled 2023-09-17: qty 4

## 2023-09-17 MED ORDER — PROPOFOL 10 MG/ML IV BOLUS
INTRAVENOUS | Status: AC
  Filled 2023-09-17: qty 20

## 2023-09-17 MED ORDER — NICOTINE 7 MG/24HR TD PT24
7.0000 mg | MEDICATED_PATCH | Freq: Every day | TRANSDERMAL | Status: DC
  Administered 2023-09-17 – 2023-09-19 (×3): 7 mg via TRANSDERMAL
  Filled 2023-09-17 (×3): qty 1

## 2023-09-17 MED ORDER — PROPOFOL 10 MG/ML IV BOLUS
INTRAVENOUS | Status: DC | PRN
  Administered 2023-09-17: 30 mg via INTRAVENOUS

## 2023-09-17 MED ORDER — VANCOMYCIN HCL 1000 MG IV SOLR
INTRAVENOUS | Status: AC
Start: 2023-09-17 — End: 2023-09-17
  Filled 2023-09-17: qty 20

## 2023-09-17 MED ORDER — ALBUMIN HUMAN 5 % IV SOLN: INTRAVENOUS | Status: DC | PRN

## 2023-09-17 SURGICAL SUPPLY — 42 items
BAG COUNTER SPONGE SURGICOUNT (BAG) ×1 IMPLANT
BIT DRILL 7/64X5 DISP (BIT) ×1 IMPLANT
BIT DRILL RINGLOC 3.2MMX40 (BIT) IMPLANT
BLADE SAW SGTL 18X1.27X75 (BLADE) ×1 IMPLANT
BNDG COHESIVE 6X5 TAN ST LF (GAUZE/BANDAGES/DRESSINGS) ×1 IMPLANT
CHLORAPREP W/TINT 26 (MISCELLANEOUS) ×1 IMPLANT
COVER PERINEAL POST (MISCELLANEOUS) ×1 IMPLANT
COVER SURGICAL LIGHT HANDLE (MISCELLANEOUS) ×1 IMPLANT
DERMABOND ADVANCED .7 DNX12 (GAUZE/BANDAGES/DRESSINGS) ×1 IMPLANT
DRAPE C-ARM 42X72 X-RAY (DRAPES) ×1 IMPLANT
DRAPE IMP U-DRAPE 54X76 (DRAPES) ×1 IMPLANT
DRAPE STERI IOBAN 125X83 (DRAPES) ×1 IMPLANT
DRAPE U-SHAPE 47X51 STRL (DRAPES) IMPLANT
DRESSING AQUACEL AG SP 3.5X6 (GAUZE/BANDAGES/DRESSINGS) ×1 IMPLANT
DRSG AQUACEL AG ADV 3.5X10 (GAUZE/BANDAGES/DRESSINGS) IMPLANT
ELECTRODE REM PT RTRN 9FT ADLT (ELECTROSURGICAL) ×1 IMPLANT
GLOVE BIO SURGEON STRL SZ 6.5 (GLOVE) ×3 IMPLANT
GLOVE BIO SURGEON STRL SZ7.5 (GLOVE) ×3 IMPLANT
GLOVE BIOGEL PI IND STRL 6.5 (GLOVE) ×1 IMPLANT
GLOVE BIOGEL PI IND STRL 7.5 (GLOVE) ×1 IMPLANT
GOWN STRL REUS W/ TWL LRG LVL3 (GOWN DISPOSABLE) ×2 IMPLANT
HEAD FEM -3XOFST 36XMDLR (Head) IMPLANT
HOOD PEEL AWAY T7 (MISCELLANEOUS) ×3 IMPLANT
KIT BASIN OR (CUSTOM PROCEDURE TRAY) ×1 IMPLANT
KIT TURNOVER KIT B (KITS) ×1 IMPLANT
LINER ACETAB NN G7 F 36 (Liner) IMPLANT
MANIFOLD NEPTUNE II (INSTRUMENTS) ×1 IMPLANT
NS IRRIG 1000ML POUR BTL (IV SOLUTION) ×1 IMPLANT
PACK TOTAL JOINT (CUSTOM PROCEDURE TRAY) ×1 IMPLANT
PACK UNIVERSAL I (CUSTOM PROCEDURE TRAY) ×1 IMPLANT
PAD ARMBOARD POSITIONER FOAM (MISCELLANEOUS) ×2 IMPLANT
PENCIL BUTTON HOLSTER BLD 10FT (ELECTRODE) IMPLANT
SCREW BONE SELF TAP 6.5X40 (Screw) IMPLANT
SET INTERPULSE LAVAGE W/TIP (ORTHOPEDIC DISPOSABLE SUPPLIES) ×1 IMPLANT
SHELL ACET G7 4H 56 SZF (Shell) IMPLANT
STEM FEM CMTLS STD 14X146 (Stem) IMPLANT
SUT ETHIBOND NAB CT1 #1 30IN (SUTURE) ×1 IMPLANT
SUT MON AB 2-0 CT1 36 (SUTURE) IMPLANT
SUT VIC AB 0 CT1 27XBRD ANBCTR (SUTURE) IMPLANT
TOWEL GREEN STERILE (TOWEL DISPOSABLE) ×1 IMPLANT
TUBE SUCT ARGYLE STRL (TUBING) ×1 IMPLANT
WATER STERILE IRR 1000ML POUR (IV SOLUTION) ×1 IMPLANT

## 2023-09-17 NOTE — Progress Notes (Signed)
  Progress Note   Date: 09/17/2023  Patient Name: David [name]        MRN#: [id number]   Clarification of the pathological fracture(s):   Osteoporotic pathological fracture of subcapital fracture of right femoral head and neck junction (a minor fall/trauma would not break a healthy bone)

## 2023-09-17 NOTE — Evaluation (Signed)
 Physical Therapy Evaluation Patient Details Name: David [name] MRN: [id number] DOB: December 05, 1975 Today's Date: 09/17/2023  History of Present Illness  Pt is a 48 y.o. male who presented 09/15/23 with R hip pain s/p fall x3 weeks prior. Pt found to have an acute comminuted, impacted and displaced right femoral neck and head fracture. S/p R anterior approach THA 8/20. 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 PTA, he was independent without an AD for functional mobility, living alone in a 1-level apartment with a level entry. He has PRN assistance available to him from his family upon return home. Currently, the pt is demonstrating limitations in R hip ROM, generalized strength (especially at R hip), balance, and activity tolerance. He is limited by R hip pain, but motivated to participate and improve. He is currently requiring up to minA for transfers, modA to lift his legs onto the bed with sit to supine transitions, CGA for supine to sit transitions using bed features, and CGA to ambulate up to ~16 ft with RW support. He is at risk for subsequent falls. As the pt is highly motivated to progress back to being independent, will likely progress quickly, and has had a drastic functional decline, he could greatly benefit from intensive inpatient rehab, > 3 hours/day, with a goal to progress to a mod I level to d/c home alone with PRN support. Will continue to follow acutely.      If plan is discharge home, recommend the following: A little help with walking and/or transfers;A lot of help with bathing/dressing/bathroom;Assistance with cooking/housework;Assist for transportation   Can travel by  private vehicle        Equipment Recommendations Rollator (4 wheels);Other (comment) (tub/shower seat)  Recommendations for Other Services  Rehab consult    Functional Status Assessment Patient has had a recent decline in their functional status and demonstrates the ability to make significant improvements in function in a reasonable and predictable amount of time.     Precautions / Restrictions Precautions Precautions: Fall Restrictions Weight Bearing Restrictions Per Provider Order: Yes RLE Weight Bearing Per Provider Order: Weight bearing as tolerated      Mobility  Bed Mobility Overal bed mobility: Needs Assistance Bed Mobility: Supine to Sit, Sit to Supine     Supine to sit: Contact guard, HOB elevated, Used rails Sit to supine: Mod assist   General bed mobility comments: Extra time and cues to bring R leg off EOB. Cues provided to grab rail to pull trunk up to sit. CGA for safety transitioning supine to sit R EOB. ModA needed to lift legs onto bed with return to supine with pt managing his own trunk.    Transfers Overall transfer level: Needs assistance Equipment used: Rolling walker (2 wheels) Transfers: Sit to/from Stand Sit to Stand: Min assist, Supervision           General transfer comment: MinA to power up to stand from EOB to RW. Pt stood from commode in bathroom using grab bar with supervision for safety    Ambulation/Gait Ambulation/Gait assistance: Contact guard assist Gait Distance (Feet): 16 Feet (x2 bouts of ~16 ft each bout) Assistive device: Rolling walker (2 wheels) Gait Pattern/deviations: Step-to pattern, Step-through pattern, Decreased stance time - right, Decreased  weight shift to right, Trunk flexed, Antalgic Gait velocity: decreased, very slow Gait velocity interpretation: <1.31 ft/sec, indicative of household ambulator   General Gait Details: Pt ambulates slowly with a flexed posture (neck contracture) and an antalgic gait pattern with  reduced R weight shift and stance time due to R hip pain. Pt progressing from a step-to pattern to intermittently performing a step-through gait pattern. No LOB, CGA for safety  Stairs            Wheelchair Mobility     Tilt Bed    Modified Rankin (Stroke Patients Only)       Balance Overall balance assessment: Needs assistance Sitting-balance support: No upper extremity supported, Feet supported Sitting balance-Leahy Scale: Good Sitting balance - Comments: able to reach off COG to donn L sock with extra time and CGA for safety   Standing balance support: Bilateral upper extremity supported, Single extremity supported, During functional activity Standing balance-Leahy Scale: Poor Standing balance comment: reliant on UE support                             Pertinent Vitals/Pain Pain Assessment Pain Assessment: Faces Faces Pain Scale: Hurts even more Pain Location: R hip Pain Descriptors / Indicators: Discomfort, Grimacing, Guarding, Operative site guarding Pain Intervention(s): Limited activity within patient's tolerance, Monitored during session, Premedicated before session, Repositioned, Ice applied    Home Living Family/patient expects to be discharged to:: Private residence Living Arrangements: Alone Available Help at Discharge: Family;Available PRN/intermittently Type of Home: Apartment Home Access: Level entry       Home Layout: One level Home Equipment: Crutches      Prior Function Prior Level of Function : Needs assist             Mobility Comments: Independent without AD ADLs Comments: Independent for ADLs and cooking; mother does heavy household chores and assists with transportation as the pt does not drive     Extremity/Trunk Assessment   Upper Extremity Assessment Upper Extremity Assessment: Defer to OT evaluation    Lower Extremity Assessment Lower Extremity Assessment: Generalized weakness;RLE deficits/detail RLE Deficits  / Details: R hip ROM limited by pain, especially flexion and abduction; denied numbness/tingling; hip flexion MMT score 2+    Cervical / Trunk Assessment Cervical / Trunk Assessment: Kyphotic;Other exceptions Cervical / Trunk Exceptions: contracture with neck flexed anteriorly and laterally to R with L neck rotation  Communication   Communication Communication: Impaired Factors Affecting Communication: Trach/intubated (with PMV)    Cognition Arousal: Alert, Lethargic Behavior During Therapy: Flat affect   PT - Cognitive impairments: Difficult to assess Difficult to assess due to: Tracheostomy                     PT - Cognition Comments: Pt seemed a bit lethargic but kept eyes open throughout session. Pt with flat affect. Difficult to formally assess due to being soft spoken with trach and PMV, but followed cues with extra time. Following commands: Impaired Following commands impaired: Follows multi-step commands with increased time     Cueing Cueing Techniques: Verbal cues, Tactile cues     General Comments General comments (skin integrity, edema, etc.): encouraged frequent movement of R leg and OOB mobility with staff while here    Exercises     Assessment/Plan    PT Assessment Patient needs continued PT services  PT Problem List Decreased strength;Decreased activity tolerance;Decreased balance;Decreased mobility;Decreased range of motion;Decreased knowledge of  use of DME;Pain       PT Treatment Interventions DME instruction;Gait training;Functional mobility training;Therapeutic activities;Therapeutic exercise;Balance training;Neuromuscular re-education;Patient/family education    PT Goals (Current goals can be found in the Care Plan section)  Acute Rehab PT Goals Patient Stated Goal: to improve PT Goal Formulation: With patient/family Time For Goal Achievement: 10/01/23 Potential to Achieve Goals: Good    Frequency Min 3X/week     Co-evaluation                AM-PAC PT 6 Clicks Mobility  Outcome Measure Help needed turning from your back to your side while in a flat bed without using bedrails?: A Little Help needed moving from lying on your back to sitting on the side of a flat bed without using bedrails?: A Little Help needed moving to and from a bed to a chair (including a wheelchair)?: A Little Help needed standing up from a chair using your arms (e.g., wheelchair or bedside chair)?: A Little Help needed to walk in hospital room?: Total (<20 ft) Help needed climbing 3-5 steps with a railing? : Total 6 Click Score: 14    End of Session Equipment Utilized During Treatment: Gait belt Activity Tolerance: Patient tolerated treatment well;Patient limited by pain Patient left: in bed;with call bell/phone within reach;with bed alarm set;with SCD's reapplied;with family/visitor present Nurse Communication: Mobility status;Other (comment) (pt requesting nicotine  patches, MD notified) PT Visit Diagnosis: Unsteadiness on feet (R26.81);Other abnormalities of gait and mobility (R26.89);Muscle weakness (generalized) (M62.81);History of falling (Z91.81);Difficulty in walking, not elsewhere classified (R26.2);Pain Pain - Right/Left: Right Pain - part of body: Hip    Time: 8446-8374 PT Time Calculation (min) (ACUTE ONLY): 32 min   Charges:   PT Evaluation $PT Eval Moderate Complexity: 1 Mod PT Treatments $Therapeutic Activity: 8-22 mins PT General Charges $$ ACUTE PT VISIT: 1 Visit         Theo Ferretti, PT, DPT Acute Rehabilitation Services  Office: [phone]   Theo CHRISTELLA Ferretti 09/17/2023, 4:58 PM

## 2023-09-17 NOTE — Interval H&P Note (Signed)
 History and Physical Interval Note:  09/17/2023 10:11 AM  David [name]  has presented today for surgery, with the diagnosis of RIGHT HIP FRACTURE.  The various methods of treatment have been discussed with the patient and family. After consideration of risks, benefits and other options for treatment, the patient has consented to  Procedure(s): ARTHROPLASTY, HIP, TOTAL, ANTERIOR APPROACH (Right) as a surgical intervention.  The patient's history has been reviewed, patient examined, no change in status, stable for surgery.  I have reviewed the patient's chart and labs.  Questions were answered to the patient's satisfaction.     Nai Dasch P Kaspian Muccio

## 2023-09-17 NOTE — Progress Notes (Signed)
 HD#2 SUBJECTIVE:  Patient Summary: David [name] is a 48 y.o. 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  Overnight Events: none  Interim History: Patient was on his way to his surgery.  Accompanied by his mom and another gentleman.  Patient was lying comfortably, he did say that the cough is still there.  Mother noted that he had not eaten much of his lunch and she does not think he was given dinner.  Discussed that speech came and saw him and commended patient reconsider PEG tube and to continue current diet.  OBJECTIVE:  Vital Signs: Vitals:   09/16/23 2310 09/17/23 0344 09/17/23 0529 09/17/23 0714  BP:   124/82 112/75  Pulse: 71 62 69 66  Resp: 18 18    Temp:   98.5 F (36.9 C) (!) 97.5 F (36.4 C)  TempSrc:   Oral Oral  SpO2: 90% 100% 98% 97%  Weight:      Height:       Supplemental O2:   SpO2: 97 % O2 Flow Rate (L/min): 3 L/min FiO2 (%): 28 %  Filed Weights   09/15/23 0920  Weight: 43 kg     Intake/Output Summary (Last 24 hours) at 09/17/2023 0717 Last data filed at 09/16/2023 1100 Gross per 24 hour  Intake 120 ml  Output 350 ml  Net -230 ml   Net IO Since Admission: -230 mL [09/17/23 0717]  Physical Exam: Physical Exam Vitals reviewed.  Constitutional:      Appearance: Normal appearance.     Comments: Being wheeled out of room to go to surgery.  He was lying in bed in no apparent distress and had normal effort on respiration.  Trach in place.  Neurological:     Mental Status: He is alert and oriented to person, place, and time. Mental status is at baseline.  Psychiatric:        Mood and Affect: Mood normal.     Patient Lines/Drains/Airways Status      Active Line/Drains/Airways     Name Placement date Placement time Site Days   Peripheral IV 12/13/22 20 G 1.88 Left;Anterior Forearm 12/13/22  1407  Forearm  277   Peripheral IV 09/15/23 20 G Right Antecubital 09/15/23  1333  Antecubital  1   Incision - 3 Ports Abdomen 1: Right;Lateral 2: Right;Lateral;Medial 3: Umbilicus 06/20/21  0805  -- 818   Tracheostomy Shiley Flexible 4 mm Uncuffed 08/15/21  1540  4 mm  762   Tracheostomy Shiley Flexible 4 mm Uncuffed 12/13/22  1302  4 mm  277   Tracheostomy  7 mm Uncuffed 09/15/23  1630  7 mm  1   Wound / Incision (Open or Dehisced) 09/14/21 Puncture Neck Right cervical lymph node bx site 09/14/21  1438  Neck  732            Pertinent labs and imaging:      Latest Ref Rng & Units 09/17/2023    5:25 AM 09/16/2023    6:15 AM 09/15/2023   10:34 AM  CBC  WBC 4.0 - 10.5 K/uL 4.6  7.1  6.7   Hemoglobin 13.0 - 17.0 g/dL 89.2  89.1  88.4   Hematocrit 39.0 - 52.0 % 34.5  33.7  38.8   Platelets 150 - 400 K/uL 270  301  322        Latest Ref Rng & Units 09/17/2023    5:25 AM 09/16/2023    6:15 AM 09/15/2023   10:34 AM  CMP  Glucose 70 - 99 mg/dL 87  68  64   BUN 6 - 20 mg/dL 17  15  22    Creatinine 0.61 - 1.24 mg/dL 9.28  9.38  9.37   Sodium 135 - 145 mmol/L 141  138  140   Potassium 3.5 - 5.1 mmol/L 3.6  3.9  4.4   Chloride 98 - 111 mmol/L 102  103  101   CO2 22 - 32 mmol/L 28  23  29    Calcium 8.9 - 10.3 mg/dL 9.3  9.0  9.6     No results found.   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  Patient is now s/p definitive total hip arthroplasty on 8/20.   #Acute comminuted, displaced, and impacted subcapital fracture of right femoral head and neck junction  THA with Dr. Keturah completed today.  Dr. Altamease recommends weightbearing as tolerated, postoperative cefazolin  2 g once. Monitor hemoglobin with plans to restart apixaban  once hemoglobin is stable, likely postop day 2 (Friday). 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 His swallowing has gotten worse while being in the hospital.  Speech does not recommend further modified barium swallow studies as this will offer little benefit.  Speech recommends encouraging PEG.  Speech recommends continuing the current diet.  Speech recommends crushing his medicines with pure. - Lidocaine  mouth solution every 6 hours as needed -Sodium chloride  nasal spray as needed -Guaifenesin  tablet 2 times daily as needed - RT following for trach care, plan to place new tracheostomy prior to discharge   #Chronic pain syndrome Patient follows palliative care for chronic pain.  He is currently on methadone  at home.  His current dose includes 130 mg of methadone  daily as well as pregabalin  50 mg 3 times daily.   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 He follows  with Dr. Overton of ID.  Under milligrams of fluconazole  daily, continue indefinitely. -Resume home fluconazole  400 mg daily   #History of right IJ vein occlusion Patient has a past medical history of right IJ vein occlusion.  This was diagnosed on admission in 09/04/2022.  Patient encouraged to be on Eliquis  indefinitely.  In anticipation of surgery, we will hold Eliquis  and bridged  with Lovenox . - Hold Eliquis  - Bridged with Lovenox  30 mg  #Anxiety Will restart home sertraline  25 mg daily  Best Practice: Diet: Regular diet IVF: Fluids: D5-LR, VTE: Restart apixaban  postop day 2 pending hemoglobin Code: Full  Disposition planning: Therapy Recs: Pending, DME: other pending Family Contact: mother, to be notified. DISPO: Anticipated discharge in 2-3 days to pending clinical improvement  Signature:  Viktoria Charmayne [facility]  7:17 AM, 09/17/2023  On Call pager [phone]

## 2023-09-17 NOTE — Anesthesia Preprocedure Evaluation (Signed)
 Anesthesia Evaluation  Patient identified by MRN, date of birth, ID band Patient awake    Reviewed: Allergy & Precautions, NPO status , Patient's Chart, lab work & pertinent test results  Airway Mallampati: Trach       Dental   Pulmonary Current Smoker and Patient abstained from smoking. 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   Neuro/Psych   Anxiety      Neuromuscular disease    GI/Hepatic ,,,(+)     substance abuse  , Hepatitis -S/p PEG   Endo/Other  negative endocrine ROS    Renal/GU negative Renal ROS     Musculoskeletal negative musculoskeletal ROS (+)    Abdominal   Peds  Hematology  (+) Blood dyscrasia, anemia   Anesthesia Other Findings PROTEIN CALORIE MALNUTRITION  Reproductive/Obstetrics                              Anesthesia Physical Anesthesia Plan  ASA: 3  Anesthesia Plan: General   Post-op Pain Management:    Induction: Intravenous  PONV Risk Score and Plan: 1 and Ondansetron , Dexamethasone , Midazolam  and Treatment may vary due to age or medical condition  Airway Management Planned: Tracheostomy  Additional Equipment:   Intra-op Plan:   Post-operative Plan: Extubation in OR and Possible Post-op intubation/ventilation  Informed Consent: I have reviewed the patients History and Physical, chart, labs and discussed the procedure including the risks, benefits and alternatives for the proposed anesthesia with the patient or authorized representative who has indicated his/her understanding and acceptance.     Dental advisory given  Plan Discussed with: CRNA  Anesthesia Plan Comments:          Anesthesia Quick Evaluation

## 2023-09-17 NOTE — Progress Notes (Signed)
  Inpatient Rehab Admissions Coordinator :  Per therapy recommendations, patient was screened for CIR candidacy by Heron Leavell RN MSN. Noted from PT that likely to progress rapidly. I would like to see how he does in his next session before determining if home with [facility] vs other rehab [facility] options. I will follow up tomorrow. Please call me with any questions.  Heron Leavell RN MSN Admissions Coordinator [phone]

## 2023-09-17 NOTE — Anesthesia Procedure Notes (Signed)
 Procedure Name: Intubation Date/Time: 09/17/2023 11:47 AM  Performed by: Zelphia Norleen HERO, CRNAPre-anesthesia Checklist: Patient identified, Emergency Drugs available, Suction available and Patient being monitored Patient Re-evaluated:Patient Re-evaluated prior to induction Oxygen  Delivery Method: Circle system utilized Preoxygenation: Pre-oxygenation with 100% oxygen  Induction Type: Combination inhalational/ intravenous induction and Tracheostomy Tube type: Oral Tube size: 6.0 mm Number of attempts: 1 Placement Confirmation: positive ETCO2 and breath sounds checked- equal and bilateral Tube secured with: Tape Dental Injury: Teeth and Oropharynx as per pre-operative assessment

## 2023-09-17 NOTE — Anesthesia Postprocedure Evaluation (Signed)
 Anesthesia Post Note  Patient: David [name]  Procedure(s) Performed: ARTHROPLASTY, HIP, TOTAL, ANTERIOR APPROACH (Right: Hip)     Patient location during evaluation: PACU Anesthesia Type: General Level of consciousness: awake and alert Pain management: pain level controlled Vital Signs Assessment: post-procedure vital signs reviewed and stable Respiratory status: spontaneous breathing, nonlabored ventilation, respiratory function stable and patient connected to nasal cannula oxygen  Cardiovascular status: blood pressure returned to baseline and stable Postop Assessment: no apparent nausea or vomiting Anesthetic complications: no   No notable events documented.  Last Vitals:  Vitals:   09/17/23 1330 09/17/23 1352  BP: (!) 131/93 (!) 135/97  Pulse: 90 93  Resp: 18 19  Temp: (!) 36.2 C (!) 36.4 C  SpO2: 97% 100%    Last Pain:  Vitals:   09/17/23 1358  TempSrc:   PainSc: 7                  Epifanio Lamar BRAVO

## 2023-09-17 NOTE — Op Note (Signed)
 Orthopaedic Surgery Operative Note (CSN: [id number] ) Date of Surgery: 09/17/2023  Admit Date: 09/15/2023   Diagnoses: Pre-Op Diagnoses: Right displaced femoral neck fracture   Post-Op Diagnosis: Same  Procedures: CPT 27130-Right total hip arthroplasty for femoral neck fracture  Surgeons : Primary: Kendal Franky SQUIBB, MD  Assistant: Lauraine Moores, PA-C  Location: OR 3   Anesthesia: General   Antibiotics: Ancef  2g preop with 1 gm vancomycin  powder placed topically   Tourniquet time: None    Estimated Blood Loss: 500 mL  Complications:* No complications entered in OR log *   Specimens:* No specimens in log *   Implants: Implant Name Type Inv. Item Serial No. Manufacturer Lot No. LRB No. Used Action  SHELL ACET G7 4H 56 SZF - [id number] Shell SHELL ACET G7 4H 56 SZF  ZIMMER RECON(ORTH,TRAU,BIO,SG) [id number] Right 1 Implanted  Bone Screw Self-Tapping    ZIMMER [id number] Right 1 Implanted  LINER ACETAB NN G7 F 36 - [id number] Liner LINER ACETAB NN G7 F 36  ZIMMER [employer]ON(ORTH,TRAU,BIO,SG) [id number] Right 1 Implanted  STEM FEM CMTLS STD 14X146 - [id number] Stem STEM FEM CMTLS STD 14X146  ZIMMER RECON(ORTH,TRAU,BIO,SG) [id number] Right 1 Implanted  HEAD FEM -3XOFST 36XMDLR - [id number] Head HEAD FEM -3XOFST 36XMDLR  ZIMMER RECON(ORTH,TRAU,BIO,SG) [id number] Right 1 Implanted     Indications for Surgery: 48 year old male with multiple medical comorbidities sustained a trip and fall about 3 weeks ago and sustained a femoral neck fracture.  He did not present to the emergency room until earlier this week.  He is on Eliquis  for DVT.  Due to his age as well as his displaced femoral neck fracture I recommended proceeding with right total hip arthroplasty.  Risks and benefits were discussed with the patient and his mother.  Risks include but not limited to bleeding, infection, periprosthetic fracture, hip dislocation, leg length discrepancy, nerve and blood vessel injury, DVT, even the possibility  anesthetic complications.  They agreed to proceed with surgery and consent was obtained.  Operative Findings: 1.  Right total hip arthroplasty for femoral neck fracture through anterior approach using [employer] G7 acetabular system 56 mm size shell uncemented 2.  Supplemental fixation of acetabular shell using a 6.5 mm bone screw into the posterior column. 3.  G7 acetabular system vitamin E highly cross-linked polyethylene neutral liner for 36 mm head for size F 4.  Uncemented Taperloc reduced distal size 14 standard offset stem for femur 5.  36 mm cobalt chrome head with a -3 modular neck  Procedure: The patient was identified in the preoperative holding area. Consent was confirmed with the patient and their family and all questions were answered. The operative extremity was marked after confirmation with the patient. he was then brought back to the operating room by our anesthesia colleagues.  He was placed under general anesthetic and carefully transferred over to a Hana table.  Traction was applied to the right lower extremity and fluoroscopic imaging showed the unstable nature of his injury.  Right hip was then prepped and draped in usual sterile fashion.  A timeout was performed to verify the patient, the procedure, and the extremity.  Preoperative antibiotics were redosed.  A standard anterior incision was made and carried down through skin and subcutaneous tissue.  I incised through the TFL fascia and developed the interval between the TFL and sartorius.  I then cauterized the crossing vessels in the inferior portion of the incision.  I then exposed the anterior capsule.  I then incised through the capsule  to perform a capsulotomy and proceeded to release the capsule all the way down to the lesser trochanter.  The femoral neck had disintegrated secondary to chronic movement of the fracture.  I was able to remove some of this as well as the femoral head.  I measured the size and it was 15 mm  in size.  I then proceeded to expose the acetabulum by removing the acetabular labrum and the surrounding capsule.  I then sequentially reamed from 49 mm up to 55 mm.  I A good peripheral fit and was able to remove the cartilage and get to subchondral bleeding bone.  I then proceeded to use a G7 56 mm size shell and press-fit that into the acetabulum.  There was a little bit of motion so I reinforced the fixation by drilling and placing a 6.5 millimeter screw into the posterior column.  I then used a neutral liner vitamin E highly cross-linked polyethylene and placed this in acetabular shell.  I then proceeded to deliver the femur through the wound by externally rotating it about 120 degrees.  I then performed a superior capsular release to allow for exposure and placement of my femoral broaches.  I then used the canal  finder to enter the center of the canal.  I then sequentially broached up to a size 14 and got good rotational control.  I then trialed off of this with a standard offset neck and a -3 femoral head.  The leg lengths were relatively symmetric.  There was a little bit longer on the right side however with the chronic nature as well as his small body habitus I felt that the stability was needed with a little bit more length to the leg.  I then redislocated removed the trial implants and then proceeded to place a size 14 uncemented femoral stem and got good press-fit and rotational control.  I then proceeded to place a cobalt chrome 36 mm head with a modular -3 neck.  The hip was reduced and final fluoroscopic imaging was obtained.  The incisions were irrigated.  A gram of vancomycin  powder was placed into the incision.  A layer closure of #1 Ethibond for capsular closure and 0 Vicryl for TFL fascia closure.  The skin was closed with 2-0 Monocryl and 3-0 Monocryl and Dermabond.  Sterile dressing was applied.  The patient was then awoke from anesthesia and taken to the PACU in stable condition.   Post  Op Plan/Instructions: The patient will be weightbearing as tolerated to the right lower extremity.  He will receive postoperative Ancef .  No hip precautions are needed.  We will monitor his hemoglobin and potentially restart his Eliquis  once his hemoglobin is stabilized.  I would imagine this will be postoperative day 2.  We will have him mobilize with physical and Occupational Therapy.  I was present and performed the entire surgery.  Lauraine Moores, PA-C did assist me throughout the case. An assistant was necessary given the difficulty in approach, maintenance of reduction and ability to instrument the fracture.   Franky Light, MD [facility]

## 2023-09-17 NOTE — Transfer of Care (Signed)
 Immediate Anesthesia Transfer of Care Note  Patient: David [name]  Procedure(s) Performed: ARTHROPLASTY, HIP, TOTAL, ANTERIOR APPROACH (Right: Hip)  Patient Location: PACU  Anesthesia Type:General  Level of Consciousness: sedated  Airway & Oxygen  Therapy: Patient Spontanous Breathing and Patient connected to tracheostomy mask oxygen   Post-op Assessment: Report given to RN and Post -op Vital signs reviewed and stable  Post vital signs: Reviewed and stable  Last Vitals:  Vitals Value Taken Time  BP 137/95 09/17/23 13:00  Temp 98   Pulse 89 09/17/23 13:03  Resp 18 09/17/23 13:03  SpO2 100 % 09/17/23 13:03  Vitals shown include unfiled device data.  Last Pain:  Vitals:   09/17/23 0923  TempSrc:   PainSc: 0-No pain      Patients Stated Pain Goal: 0 (09/17/23 0923)  Complications: No notable events documented.

## 2023-09-18 DIAGNOSIS — F419 Anxiety disorder, unspecified: Secondary | ICD-10-CM

## 2023-09-18 DIAGNOSIS — M80051A Age-related osteoporosis with current pathological fracture, right femur, initial encounter for fracture: Principal | ICD-10-CM

## 2023-09-18 DIAGNOSIS — S72011A Unspecified intracapsular fracture of right femur, initial encounter for closed fracture: Secondary | ICD-10-CM

## 2023-09-18 LAB — BASIC METABOLIC PANEL WITH GFR
Anion gap: 9 (ref 5–15)
Anion gap: 9 (ref 5–15)
BUN: 14 mg/dL (ref 6–20)
BUN: 13 mg/dL (ref 6–20)
CO2: 26 mmol/L (ref 22–32)
CO2: 28 mmol/L (ref 22–32)
Calcium: 8.7 mg/dL — ABNORMAL LOW (ref 8.9–10.3)
Calcium: 8.8 mg/dL — ABNORMAL LOW (ref 8.9–10.3)
Chloride: 98 mmol/L (ref 98–111)
Chloride: 100 mmol/L (ref 98–111)
Creatinine, Ser: 0.66 mg/dL (ref 0.61–1.24)
Creatinine, Ser: 0.61 mg/dL (ref 0.61–1.24)
GFR, Estimated: >60 mL/min (ref >60)
GFR, Estimated: >60 mL/min (ref >60)
Glucose, Bld: 139 mg/dL — ABNORMAL HIGH (ref 70–99)
Glucose, Bld: 86 mg/dL (ref 70–99)
Potassium: 3.8 mmol/L (ref 3.5–5.1)
Potassium: 4.1 mmol/L (ref 3.5–5.1)
Sodium: 133 mmol/L — ABNORMAL LOW (ref 135–145)
Sodium: 137 mmol/L (ref 135–145)

## 2023-09-18 LAB — CBC
HCT: 21.9 % — ABNORMAL LOW (ref 39.0–52.0)
Hemoglobin: 7.1 g/dL — ABNORMAL LOW (ref 13.0–17.0)
MCH: 27.6 pg (ref 26.0–34.0)
MCHC: 32.4 g/dL (ref 30.0–36.0)
MCV: 85.2 fL (ref 80.0–100.0)
Platelets: 193 K/uL (ref 150–400)
RBC: 2.57 MIL/uL — ABNORMAL LOW (ref 4.22–5.81)
RDW: 15 % (ref 11.5–15.5)
WBC: 5.2 K/uL (ref 4.0–10.5)
nRBC: 0 % (ref 0.0–0.2)

## 2023-09-18 LAB — POCT I-STAT EG7
Acid-Base Excess: 2 mmol/L (ref 0.0–2.0)
Bicarbonate: 27.8 mmol/L (ref 20.0–28.0)
Calcium, Ion: 1.13 mmol/L — ABNORMAL LOW (ref 1.15–1.40)
HCT: 27 % — ABNORMAL LOW (ref 39.0–52.0)
Hemoglobin: 9.2 g/dL — ABNORMAL LOW (ref 13.0–17.0)
O2 Saturation: 72 %
Patient temperature: 40.2
Potassium: 4.3 mmol/L (ref 3.5–5.1)
Sodium: 136 mmol/L (ref 135–145)
TCO2: 29 mmol/L (ref 22–32)
pCO2, Ven: 56.5 mmHg (ref 44–60)
pH, Ven: 7.315 (ref 7.25–7.43)
pO2, Ven: 50 mmHg — ABNORMAL HIGH (ref 32–45)

## 2023-09-18 MED ORDER — TRANEXAMIC ACID-NACL 1000-0.7 MG/100ML-% IV SOLN
1000.0000 mg | Freq: Once | INTRAVENOUS | Status: AC
  Administered 2023-09-18: 1000 mg via INTRAVENOUS
  Filled 2023-09-18: qty 100

## 2023-09-18 MED ORDER — ADULT MULTIVITAMIN W/MINERALS CH
1.0000 | ORAL_TABLET | Freq: Every day | ORAL | Status: DC
  Administered 2023-09-18 – 2023-09-19 (×2): 1 via ORAL
  Filled 2023-09-18 (×2): qty 1

## 2023-09-18 MED ORDER — METHOCARBAMOL 1000 MG/10ML IJ SOLN: 500.0000 mg | Freq: Four times a day (QID) | INTRAMUSCULAR | Status: DC | PRN

## 2023-09-18 MED ORDER — METOCLOPRAMIDE HCL 5 MG/ML IJ SOLN: 5.0000 mg | Freq: Three times a day (TID) | INTRAMUSCULAR | Status: DC | PRN

## 2023-09-18 MED ORDER — PROPOFOL 1000 MG/100ML IV EMUL
INTRAVENOUS | Status: AC
  Filled 2023-09-18: qty 100

## 2023-09-18 MED ORDER — ENSURE PLUS HIGH PROTEIN PO LIQD
237.0000 mL | Freq: Two times a day (BID) | ORAL | Status: DC
  Administered 2023-09-19: 237 mL via ORAL
  Filled 2023-09-18: qty 237

## 2023-09-18 MED ORDER — METOCLOPRAMIDE HCL 5 MG PO TABS: 5.0000 mg | ORAL_TABLET | Freq: Three times a day (TID) | ORAL | Status: DC | PRN

## 2023-09-18 MED ORDER — METHOCARBAMOL 500 MG PO TABS
500.0000 mg | ORAL_TABLET | Freq: Four times a day (QID) | ORAL | Status: DC | PRN
  Administered 2023-09-18: 500 mg via ORAL
  Filled 2023-09-18: qty 1

## 2023-09-18 MED ORDER — CEFAZOLIN SODIUM-DEXTROSE 2-4 GM/100ML-% IV SOLN
2.0000 g | Freq: Four times a day (QID) | INTRAVENOUS | Status: AC
  Administered 2023-09-18: 2 g via INTRAVENOUS
  Filled 2023-09-18: qty 100

## 2023-09-18 NOTE — Progress Notes (Signed)
 Orthopaedic Trauma Progress Note  SUBJECTIVE: Doing ok this morning.  Pain has been controlled at rest but does increase with motion.  States the pain feels fairly similar to before surgery.  He has not required any of the PRN pain medications post op.  Has been doing well on his daily dose of methadone  and scheduled Tylenol .  Was able to mobilize with physical therapy postoperatively yesterday, looks like this went well.  No chest pain. No SOB. No nausea/vomiting. No other complaints. No family at side currently.  Patient just ordered breakfast.  OBJECTIVE:  Vitals:   09/18/23 0752 09/18/23 0801  BP: 111/89   Pulse: 78 88  Resp:  18  Temp: 98.4 F (36.9 C)   SpO2: 97% 98%    Opiates Today (MME): Today's  total administered Morphine Milligram Equivalents: 0 Opiates Yesterday (MME): Yesterday's total administered Morphine Milligram Equivalents: 655  General: Sitting up in bed.  No acute distress Respiratory: No increased work of breathing.  Operative Extremity (RLE): Aquacel dressing CDI.  Some mild tenderness of the hip as expected.  Less tender throughout the thigh or lower leg.  Compartment soft and compressible.  Ankle DF/PF intact.  Toes warm well-perfused. + DP pulse  IMAGING: Stable post op imaging right hip  LABS:  Results for orders placed or performed during the hospital encounter of 09/15/23 (from the past 24 hours)  Type and screen [facility]     Status: None   Collection Time: 09/17/23  9:20 AM  Result Value Ref Range   ABO/RH(D) A POS    Antibody Screen NEG    Sample Expiration      09/20/2023,2359 Performed at [facility], [address], [place], [place] [address]   ABO/Rh     Status: None   Collection Time: 09/17/23  9:25 AM  Result Value Ref Range   ABO/RH(D)      A POS Performed at [facility], [address] [place], [place] [address]   CBC     Status: Abnormal   Collection Time: 09/18/23  4:59 AM  Result Value Ref Range    WBC 5.2 4.0 - 10.5 K/uL   RBC 2.57 (L) 4.22 - 5.81 MIL/uL   Hemoglobin 7.1 (L) 13.0 - 17.0 g/dL   HCT 78.0 (L) 60.9 - 47.9 %   MCV 85.2 80.0 - 100.0 fL   MCH 27.6 26.0 - 34.0 pg   MCHC 32.4 30.0 - 36.0 g/dL   RDW 84.9 88.4 - 84.4 %   Platelets 193 150 - 400 K/uL   nRBC 0.0 0.0 - 0.2 %  Basic metabolic panel     Status: Abnormal   Collection Time: 09/18/23  4:59 AM  Result Value Ref Range   Sodium 133 (L) 135 - 145 mmol/L   Potassium 3.8 3.5 - 5.1 mmol/L   Chloride 98 98 - 111 mmol/L   CO2 26 22 - 32 mmol/L   Glucose, Bld 139 (H) 70 - 99 mg/dL   BUN 14 6 - 20 mg/dL   Creatinine, Ser 9.33 0.61 - 1.24 mg/dL   Calcium 8.7 (L) 8.9 - 10.3 mg/dL   GFR, Estimated >39 >39 mL/min   Anion gap 9 5 - 15    ASSESSMENT: David [name] is a 48 y.o. male, 1 Day Post-Op s/p fall about 3 weeks ago with delayed presentation  Procedures: RIGHT TOTAL HIP ARTHROPLASTY FOR FRACTURE, ANTERIOR APPROACH  CV/Blood loss: Acute blood loss anemia, Hgb 7.1 this  AM. Hemodynamically stable  PLAN: Weightbearing: WBAT RLE ROM: Unrestricted ROM.  No hip precautions needed Incisional and dressing care: Dressings left intact until follow-up  Showering: Okay to begin showering 09/19/2023.  Aquacel dressing may get wet Orthopedic device(s): None  Pain management:  1. Tylenol  650 mg 3 times daily 2. Robaxin  500 mg q 6 hours PRN 3. Oxycodone  5-10 mg q 4 hours PRN 4. Dilaudid  0.5-1 mg q 2 hours PRN 5. Methadone  130 mg daily VTE prophylaxis: Hold restarting home dose Eliquis  until hemoglobin stable.  Continue SCDs ID:  Ancef  2gm post op Foley/Lines:  No foley, KVO IVFs Impediments to Fracture Healing: Vitamin D  level 11, started on supplementation Dispo: PT/OT evaluation ongoing, will likely progress quickly with therapies.  PT is currently recommending CIR.  Admission coordinator plans follow-up with patient today.  Continue to monitor CBC, restart Eliquis  once hemoglobin is stabilized.    D/C  recommendations: - Continue baseline dose methadone  for pain control.  May require PRN oxycodone  depending on pain with mobility- Home dose Eliquis  for DVT prophylaxis - Continue 50,000 units Vit D supplementation weekly x 5 weeks  Follow - up plan: 2 weeks after d/c for wound check and repeat x-rays   Contact information:  Franky Light MD, Lauraine Moores PA-C. After hours and holidays please check [email or web address] for group call information for [facility]   Lauraine PATRIC Moores, PA-C [phone] (office) [email or web address]

## 2023-09-18 NOTE — Progress Notes (Signed)
   Inpatient Rehab Admissions Coordinator :  Per therapy recommendations, patient was screened for CIR candidacy by Heron Leavell RN MSN.  At this time patient appears to be a potential candidate for CIR. I will place a rehab consult per protocol for full assessment. An admissions Coordinator will follow up Friday to assess .Please call me with any questions.  Heron Leavell RN MSN Admissions Coordinator [phone]

## 2023-09-18 NOTE — Evaluation (Signed)
 Occupational Therapy Evaluation Patient Details Name: David [name] MRN: [id number] DOB: 1975/09/23 Today's Date: 09/18/2023   History of Present Illness   Pt is a 48 y.o. male who presented 09/15/23 with R hip pain s/p fall x3 weeks prior. Pt found to have an acute comminuted, impacted and displaced right femoral neck and head fracture. S/p R anterior approach THA 8/20. 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 At baseline, pt receives assistance from family for other home management tasks and transportation. Pt now presents with decreased activity tolerance, pain affecting functional level, decreased balance, generalized B UE weakness, and decreased safety and independence with functional tasks. Pt currently demonstrates ability to complete ADLS Mod I to Mod assist and functional mobility/transfers with a RW with Contact guard to Min assist. OT initiated education in compensatory strategies to increase safety and independence with ADLs with pt verbalizing understanding and demonstrating understanding through teach back. Pt's O2 sat largely >/94% on RA but pt with brief drop to 87% when initally standing and with O2 sat quickly recovering to >/94% with standing rest break. Pt HR in the 80s to 90s throughout session. Pt participated well and is motivated to return to PLOF. Pt will benefit from acute skilled OT services to address deficits and increase safety and independence with functional tasks. Post acute discharge, pt will benefit from intensive inpatient skilled rehab services > 3 hours per day to maximize rehab potential, decrease risk of falls, and decrease risk of  rehospitalization      If plan is discharge home, recommend the following:   A little help with walking and/or transfers;A lot of help with bathing/dressing/bathroom;Assistance with cooking/housework;Assist for transportation;Help with stairs or ramp for entrance     Functional Status Assessment   Patient has had a recent decline in their functional status and demonstrates the ability to make significant improvements in function in a reasonable and predictable amount of time.     Equipment Recommendations   BSC/3in1;Other (comment) (RW)     Recommendations for Other Services   Rehab consult     Precautions/Restrictions   Precautions Precautions: Fall Restrictions Weight Bearing Restrictions Per Provider Order: Yes RLE Weight Bearing Per Provider Order: Weight bearing as tolerated Other Position/Activity Restrictions: no hip precautions     Mobility Bed Mobility Overal bed mobility: Needs Assistance             General bed mobility comments: Pt sitting in recliner at beginning and end of session    Transfers Overall transfer level: Needs assistance Equipment used: Rolling walker (2 wheels) Transfers: Sit to/from Stand, Bed to chair/wheelchair/BSC Sit to Stand: Min assist     Step pivot transfers: Contact guard assist     General transfer comment: Min assist to power up to stand from recliner to RW. Pt prefers to put a hand on RW so RW needed stabilization. Increased time needed.      Balance Overall balance assessment: Needs assistance Sitting-balance support: No upper extremity supported, Feet supported Sitting balance-Leahy Scale: Good     Standing balance support: Single extremity supported, Bilateral upper extremity supported, During functional activity, Reliant on assistive device for balance Standing balance-Leahy Scale: Poor Standing balance comment: reliant on UE support  ADL either performed or  assessed with clinical judgement   ADL Overall ADL's : Needs assistance/impaired Eating/Feeding: Modified independent;Sitting   Grooming: Set up;Contact guard assist;Sitting   Upper Body Bathing: Minimal assistance;Sitting   Lower Body Bathing: Moderate assistance;Sit to/from stand;Sitting/lateral leans;Cueing for compensatory techniques   Upper Body Dressing : Contact guard assist;Sitting   Lower Body Dressing: Moderate assistance;Sitting/lateral leans;Sit to/from stand;Cueing for compensatory techniques Lower Body Dressing Details (indicate cue type and reason): Pt reports often putting his socks and pants on from bed level, bringing his knees up to his chest. However, pt with decreased ROM in R hip and pain from R hip also affecting pt ability to reach L foot for dressing. Toilet Transfer: Contact guard assist;Minimal assistance;BSC/3in1;Rolling walker (2 wheels) (step-pivot) Toilet Transfer Details (indicate cue type and reason): pt requires Min assist to power up than CGA once in standing Toileting- Clothing Manipulation and Hygiene: Set up;Sitting/lateral lean;Minimal assistance;Sit to/from stand Toileting - Clothing Manipulation Details (indicate cue type and reason): Set up for use of hand held urinal in sitting; Min assist for hyigine/clothing management in sit/stand     Functional mobility during ADLs: Contact guard assist;Rolling walker (2 wheels) General ADL Comments: Pt with decreased activity tolerance and pain affecting functional level.     Vision Patient Visual Report: No change from baseline Vision Assessment?: No apparent visual deficits Additional Comments: [facility] for tasks assessed; not fomrally screened or evaluated     Perception         Praxis         Pertinent Vitals/Pain Pain Assessment Pain Assessment: 0-10 Pain Score: 5  Pain Location: R hip Pain Descriptors / Indicators: Discomfort, Grimacing, Guarding, Operative site guarding Pain  Intervention(s): Limited activity within patient's tolerance, Monitored during session, Repositioned     Extremity/Trunk Assessment Upper Extremity Assessment Upper Extremity Assessment: Right hand dominant;Generalized weakness   Lower Extremity Assessment Lower Extremity Assessment: Defer to PT evaluation   Cervical / Trunk Assessment Cervical / Trunk Assessment: Kyphotic;Other exceptions Cervical / Trunk Exceptions: contracture with neck flexed anteriorly and laterally to R with L neck rotation   Communication Communication Communication: Impaired Factors Affecting Communication: Trach/intubated (with PMV)   Cognition Arousal: Alert Behavior During Therapy: Flat affect Cognition: Difficult to assess Difficult to assess due to: Tracheostomy           OT - Cognition Comments: Pt soft spoken due to trach with decreased speech clarity due to trach. Pt AAOx4 and able to provide history and house set up and follows multi-step commands with increased time. Pt appears easily internally distracted.                 Following commands: Impaired Following commands impaired: Follows multi-step commands with increased time     Cueing  General Comments   Cueing Techniques: Verbal cues;Tactile cues;Visual cues  O2 sat largely >/94% but pt with brief drop to 87% when initally standing and with O2 sat quickly recovering to >/94% with standing rest break. Pt HR in the 80s to 90s throughout session. Pt's mother present and supportive throughout session. OT also educated pt in and answered pt's general questions regarding differences between inpatient rehab, short-term rehab at SNF, and [facility] OT/PT.   Exercises     Shoulder Instructions      Home Living Family/patient expects to be discharged to:: Private residence Living Arrangements: Alone Available Help at Discharge: Family;Available PRN/intermittently Type of Home: Apartment Home Access: Level entry     Home Layout: One  level     Bathroom Shower/Tub: Chief Strategy Officer: Standard     Home Equipment: Crutches;Hand held shower head;Grab bars - tub/shower   Additional Comments: Pt reports he borrowed a Occupational hygienist from a neighbor and has been using this since fall leading to this admission.      Prior Functioning/Environment Prior Level of Function : Needs assist             Mobility Comments: Independent without AD at baseline. Pt reports he borrowed a Occupational hygienist from a neighbor and has been using this since fall leading to this admission. ADLs Comments: Independent for ADLs and cooking; mother does heavy household chores and assists with transportation as the pt does not drive. Pt enjoys playing video games.    OT Problem List: Decreased strength;Decreased activity tolerance;Impaired balance (sitting and/or standing);Decreased knowledge of use of DME or AE;Decreased knowledge of precautions;Pain   OT Treatment/Interventions: Self-care/ADL training;Therapeutic exercise;Energy conservation;DME and/or AE instruction;Therapeutic activities;Patient/family education;Balance training      OT Goals(Current goals can be found in the care plan section)   Acute Rehab OT Goals Patient Stated Goal: to return home and be independent OT Goal Formulation: With patient/family Time For Goal Achievement: 10/02/23 Potential to Achieve Goals: Good ADL Goals Pt Will Perform Lower Body Bathing: with modified independence;with adaptive equipment;sitting/lateral leans;sit to/from stand Pt Will Perform Lower Body Dressing: with modified independence;with adaptive equipment;sitting/lateral leans;sit to/from stand Pt Will Transfer to Toilet: with modified independence;ambulating;regular height toilet (with least restrictive AD) Pt Will Perform Toileting - Clothing Manipulation and hygiene: with modified independence;sit to/from stand;sitting/lateral leans Pt Will Perform Tub/Shower Transfer: Tub transfer;with  supervision;3 in 1;grab bars (least restrictive AD)   OT Frequency:  Min 2X/week    Co-evaluation              AM-PAC OT 6 Clicks Daily Activity     Outcome Measure Help from another person eating meals?: None Help from another person taking care of personal grooming?: A Little Help from another person toileting, which includes using toliet, bedpan, or urinal?: A Little Help from another person bathing (including washing, rinsing, drying)?: A Lot Help from another person to put on and taking off regular upper body clothing?: A Little Help from another person to put on and taking off regular lower body clothing?: A Lot 6 Click Score: 17   End of Session Equipment Utilized During Treatment: Rolling walker (2 wheels);Other (comment);Gait belt (trach) Nurse Communication: Mobility status  Activity Tolerance: Patient tolerated treatment well Patient left: in chair;with call bell/phone within reach;with family/visitor present  OT Visit Diagnosis: Unsteadiness on feet (R26.81);Other abnormalities of gait and mobility (R26.89);History of falling (Z91.81);Muscle weakness (generalized) (M62.81);Pain                Time: 8681-8650 OT Time Calculation (min): 31 min Charges:  OT General Charges $OT Visit: 1 Visit OT Evaluation $OT Eval Moderate Complexity: 1 Mod OT Treatments $Self Care/Home Management : 8-22 mins  Margarie Rockey HERO., OTR/L, MA Acute Rehab [phone]  Margarie FORBES Horns 09/18/2023, 2:23 PM

## 2023-09-18 NOTE — Plan of Care (Signed)
   Problem: Clinical Measurements: Goal: Will remain free from infection Outcome: Progressing   Problem: Pain Managment: Goal: General experience of comfort will improve and/or be controlled Outcome: Progressing   Problem: Safety: Goal: Ability to remain free from injury will improve Outcome: Progressing
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
## 2023-09-18 NOTE — Progress Notes (Signed)
 Speech Language Pathology Treatment: Dysphagia  Patient Details Name: David [name] MRN: [id number] DOB: [birth year] Today's Date: 09/18/2023 Time: 8872-8848 SLP Time Calculation (min) (ACUTE ONLY): 24 min  Assessment / Plan / Recommendation Clinical Impression  [name] was talkative, preparing his lunch.  PMV in place - he is independent with use. PO intake, despite his best efforts, is limited due to his degree of dysphagia.  He has chronic and intermittent aspiration of thin liquids; today he coughed frequently; deals with nasal regurgitation of POs.  He uses oral suctioning to help manage expectorant.  He remains steadfast in his refusal to reconsider PEG. RD spoke with him about it this morning; reiterates the same. SLP will follow.   HPI HPI: David [name] is a 48 year old male with hx of stage IVB laryngeal cancer who was admitted for right hip surgery three weeks after a fall, after which he was crawling to get around his apartment, hoping the pain would subside. Now S/p R anterior approach THA 8/20. He is well-known to SLP service at [facility] and [facility].  He is s/p chemo and XRT.  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 Neck contraction has worsened over the last year to the point where he tried OP PT but with little benefit.  He is followed by Jeralyn Banner, PCCM, at trach clinic, who has had to make significant manipulations/adaptations to his tracheostomy to avoid skin breakdown from the flanges.  He is prone to mucous plugging. Uses a PMV.  Not a candidate for decannulation.   Hx trach 06/09/21, PEG 06/20/21, subsequently fell out per his report. 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  Quality of imaging was compromised by  shoulder/neck positioning and it was difficult to visualize anatomical markers. There were no incidents of nasal regurgitation on today's study (reported to be happening more frequently). There was minimal mobility of the larynx and incomplete laryngeal closure, leading to intermittent aspiration of thin and nectar thick liquids, occurring before and after the swallow response. There was frequent tongue pumping and effort to propel material from mouth through pharynx, leaving residuals in posterior oral cavity with multiple f/u swallows needed to move bolus into throat. There was minimal pharyngeal residue post-swallow. OP SLP to tx dysphagia from 9/23-12/23 and again from 01/14/23-03/04/23. He was managing to eat regular solids/thin liquids.  He is adamant that he will not consider PEG again.      SLP Plan  Continue with current plan of care          Recommendations  Diet recommendations: Regular;Thin liquid Liquids provided via: Straw Medication Administration: Crushed with puree Supervision: Patient able to self feed Postural Changes and/or Swallow Maneuvers: Seated upright 90 degrees      Patient may use Passy-Muir Speech Valve: During all waking hours (remove during sleep) PMSV Supervision:  (independent with PMV)           Oral care BID     Dysphagia, pharyngeal phase (R13.13)     Continue with current plan of care   Javonne Dorko L. Vona, MA CCC/SLP Clinical Specialist - Acute Care SLP Acute Rehabilitation Services Office number [phone]   Vona Palma Laurice  09/18/2023, 12:47 PM

## 2023-09-18 NOTE — Progress Notes (Signed)
 HD#3 SUBJECTIVE:  Patient Summary: David [name] is a 48 y.o. 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  Overnight Events: none  Interim History: Patient was blowing clear snot out with pink strands in it.  He thinks the pink strands is residue from something used for his tracheostomy.  He had a good night and was able to eat dinner although it took him from 8:30 PM until 3 AM to finish his dinner.  Encouraged him to continue trying to eat orally and to take his time.  Has not had a bowel movement, denies abdominal pain, denies pain with urination.  Discussed plan to have him stay here 1 more night to make sure his blood cells stabilize before adding his Eliquis  back in.  Discussed that PT will come by to work with him again, patient prefers to go home with PT.  Patient's pain is a 7 out of 10, patient says his pain is normal.  OBJECTIVE:  Vital Signs: Vitals:   09/17/23 2332 09/17/23 2333 09/18/23 0333 09/18/23 0449  BP:  (!) 122/93  110/79  Pulse: 83 77 72 74  Resp: 17 17 16 16   Temp:  97.8 F (36.6 C)  98.6 F (37 C)  TempSrc:      SpO2: 96% 97% 92% 95%  Weight:      Height:       Supplemental O2:   SpO2: 95 % O2 Flow Rate (L/min): 5 L/min FiO2 (%): 98 %  Filed Weights   09/15/23 0920 09/17/23 0841  Weight: 43 kg 43 kg     Intake/Output Summary (Last 24 hours) at 09/18/2023 0717 Last data filed at 09/17/2023 1500 Gross per 24 hour  Intake 1950 ml  Output 1300 ml  Net 650 ml   Net IO Since Admission: 420 mL [09/18/23 0717]  Physical Exam: Physical Exam Vitals reviewed.  Constitutional:      Appearance: He is not ill-appearing.     Comments: Cachectic  HENT:     Nose:  Congestion and rhinorrhea present.     Comments: Clear mucus with pink strands in it Neck:     Comments: Rightward torticollis Tracheostomy in place and functioning Cardiovascular:     Rate and Rhythm: Normal rate and regular rhythm.     Heart sounds: Normal heart sounds.  Pulmonary:     Effort: Pulmonary effort is normal.     Breath sounds: Normal breath sounds. No rales.  Abdominal:     General: Bowel sounds are normal.     Palpations: Abdomen is soft.  Musculoskeletal:     Right lower leg: No edema.     Left lower leg: No edema.     Comments: Right thigh surgical wound normal in appearance, no signs of infection or bleeding  Skin:    General: Skin is warm and dry.  Neurological:     General: No focal deficit present.     Mental Status: He is alert and oriented to person, place, and time. Mental status is at baseline.  Psychiatric:        Behavior: Behavior normal.     Patient Lines/Drains/Airways Status     Active Line/Drains/Airways     Name Placement date Placement time Site Days  Peripheral IV 12/13/22 20 G 1.88 Left;Anterior Forearm 12/13/22  1407  Forearm  277   Peripheral IV 09/15/23 20 G Right Antecubital 09/15/23  1333  Antecubital  1   Incision - 3 Ports Abdomen 1: Right;Lateral 2: Right;Lateral;Medial 3: Umbilicus 06/20/21  0805  -- 818   Tracheostomy Shiley Flexible 4 mm Uncuffed 08/15/21  1540  4 mm  762   Tracheostomy Shiley Flexible 4 mm Uncuffed 12/13/22  1302  4 mm  277   Tracheostomy  7 mm Uncuffed 09/15/23  1630  7 mm  1   Wound / Incision (Open or Dehisced) 09/14/21 Puncture Neck Right cervical lymph node bx site 09/14/21  1438  Neck  732            Pertinent labs and imaging:      Latest Ref Rng & Units 09/18/2023    4:59 AM 09/17/2023    5:25 AM 09/16/2023    6:15 AM  CBC  WBC 4.0 - 10.5 K/uL 5.2  4.6  7.1   Hemoglobin 13.0 - 17.0 g/dL 7.1  89.2  89.1   Hematocrit 39.0 - 52.0 % 21.9  34.5  33.7   Platelets 150 - 400 K/uL 193  270  301         Latest Ref Rng & Units 09/18/2023    4:59 AM 09/17/2023    5:25 AM 09/16/2023    6:15 AM  CMP  Glucose 70 - 99 mg/dL 860  87  68   BUN 6 - 20 mg/dL 14  17  15    Creatinine 0.61 - 1.24 mg/dL 9.33  9.28  9.38   Sodium 135 - 145 mmol/L 133  141  138   Potassium 3.5 - 5.1 mmol/L 3.8  3.6  3.9   Chloride 98 - 111 mmol/L 98  102  103   CO2 22 - 32 mmol/L 26  28  23    Calcium 8.9 - 10.3 mg/dL 8.7  9.3  9.0     DG HIP UNILAT W OR W/O PELVIS 2-3 VIEWS RIGHT Result Date: 09/17/2023 CLINICAL DATA:  Status post right hip arthroplasty. EXAM: DG HIP (WITH OR WITHOUT PELVIS) 2-3V RIGHT COMPARISON:  Preoperative imaging FINDINGS: Right hip arthroplasty in expected alignment. No periprosthetic lucency or fracture. Recent postsurgical change includes air and edema in the soft tissues. IMPRESSION: Right hip arthroplasty without immediate postoperative complication. Electronically Signed   By: Andrea Gasman M.D.   On: 09/17/2023 15:43   DG HIP UNILAT WITH PELVIS 1V RIGHT Result Date: 09/17/2023 CLINICAL DATA:  Right hip arthroplasty. EXAM: DG HIP (WITH OR WITHOUT PELVIS) 1V RIGHT COMPARISON:  CT right hip September 15, 2023. FINDINGS: Right femoral subcapital fracture with displacement is identified. Intraoperative spot views demonstrate right hip replacement with proper position of the surgical hardware. Nondisplaced fracture of right inferior pubic ramus. IMPRESSION: Right hip arthroplasty intraoperative spot views. Nondisplaced fracture of right inferior pubic ramus. RGM Fluoro time: 20 seconds, Dose: 1.094 mGy Electronically Signed   By: Duwaine Severs M.D.   On: 09/17/2023 13:49   DG C-Arm 1-60 Min-No Report Result Date: 09/17/2023 Fluoroscopy was utilized by the requesting physician.  No radiographic interpretation.   DG C-Arm 1-60 Min-No Report Result Date: 09/17/2023 Fluoroscopy was utilized by the requesting physician.  No radiographic interpretation.     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  Patient is now s/p definitive total hip arthroplasty on 8/20.   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 Kendal recommends weightbearing as tolerated. Monitor hemoglobin with plans to restart apixaban  once hemoglobin is stable, hemoglobin was 10.7 before surgery, it is 7.1-day after surgery.  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  Rechecked and sodium 137.  If sodium continues to downtrend, consider normal saline infusion.  Patient has some pain but it is not worse than his normal. 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  Speech evaluated him again today, no incidence of nasal regurgitation, and  minimal mobility of the larynx, intermittent aspiration, multiple follow-up swallows needed to move bolus into throat. Will continue to monitor him for signs and symptoms of abrasion and pneumonia.   - Lidocaine  mouth solution every 6 hours as needed -Sodium chloride  nasal spray as needed -Guaifenesin  tablet 2 times daily as needed - RT following for trach care, plan to place new tracheostomy prior to discharge   #Chronic pain syndrome Patient's pain today was 7 out of 10.  - Continue methadone  130 mg daily - Continue pregabalin  50 mg 3 times daily - Breakthrough pain medication ordered   #History of fungal endocarditis Did not appreciate any new murmurs on exam. - Continue home fluconazole  400 mg daily   #History of right IJ vein occlusion Continuing to hold Eliquis , postop day 1.  Will continue when hemoglobin is stable. - Hold Eliquis  - Bridged with Lovenox  30 mg  #Anxiety Will restart home sertraline  25 mg daily  Best Practice: Diet: Regular diet IVF: Fluids: VTE: SCDs Start: 09/17/23 1401Restart apixaban  postop day 2 pending hemoglobin Code: Full  Disposition planning: Therapy Recs: Pending, DME: other pending Family Contact: mother, to be notified. DISPO: Anticipated discharge 1-2 to pending clinical improvement  Signature:  Viktoria Charmayne [facility]  7:17 AM, 09/18/2023  On Call pager [phone]

## 2023-09-18 NOTE — Progress Notes (Signed)
 Physical Therapy Treatment Patient Details Name: David [name] MRN: [id number] DOB: [birth year] Today's Date: 09/18/2023   History of Present Illness Pt is a 48 y.o. male who presented 09/15/23 with R hip pain s/p fall x3 weeks prior. Pt found to have an acute comminuted, impacted and displaced right femoral neck and head fracture. S/p R anterior approach THA 8/20. 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 Still working on breakfast though tray had been in room for hours, encouraged him to order a new tray. Pt tolerating mobility on RLE very well, performed supine and seated there ex AROM and AAROM. Pt needed min A to come to EOB and stand with RW. Pt ambulated 56' with RW with very slow gait and sometimes avoidant of WBing on R side due to pain. Patient will benefit from intensive inpatient follow-up therapy, >3 hours/day to allow him to return to independence and be able to go back to his apt. PT will continue to follow.     If plan is discharge home, recommend the following: A little help with walking and/or transfers;A lot of help with bathing/dressing/bathroom;Assistance with cooking/housework;Assist for transportation   Can travel by private vehicle        Equipment Recommendations  Rollator (4 wheels);Other (comment) (tub/shower seat)    Recommendations for Other Services Rehab consult     Precautions / Restrictions Precautions Precautions: Fall Restrictions Weight Bearing Restrictions Per Provider Order: Yes RLE Weight Bearing Per Provider Order: Weight bearing as tolerated     Mobility  Bed Mobility Overal bed mobility: Needs Assistance Bed Mobility: Supine to Sit     Supine to sit: HOB elevated, Used rails, Min  assist     General bed mobility comments: needed min A for RLE    Transfers Overall transfer level: Needs assistance Equipment used: Rolling walker (2 wheels) Transfers: Sit to/from Stand Sit to Stand: Min assist           General transfer comment: MinA to power up to stand from EOB to RW. Pt prefers to put a hand on RW so RW needed stabilization. Increased time needed    Ambulation/Gait Ambulation/Gait assistance: Contact guard assist Gait Distance (Feet): 50 Feet Assistive device: Rolling walker (2 wheels) Gait Pattern/deviations: Step-through pattern, Decreased stance time - right, Decreased weight shift to right, Trunk flexed, Antalgic Gait velocity: decreased, very slow Gait velocity interpretation: <1.31 ft/sec, indicative of household ambulator   General Gait Details: vc's for keeping R hip in neutral as he is tending to internally rotate with heel strike. Closer assist given during turning. Slow gait   Stairs             Wheelchair Mobility     Tilt Bed    Modified Rankin (Stroke Patients Only)       Balance Overall balance assessment: Needs assistance Sitting-balance support: No upper extremity supported, Feet supported Sitting balance-Leahy Scale: Good     Standing balance support: Bilateral upper extremity supported, Single extremity supported, During functional activity Standing balance-Leahy Scale: Poor Standing balance comment: reliant on UE support                            Communication Communication Communication: Impaired Factors Affecting Communication: Trach/intubated (with  PMV)  Cognition Arousal: Alert, Lethargic Behavior During Therapy: Flat affect   PT - Cognitive impairments: Difficult to assess Difficult to assess due to: Tracheostomy                     PT - Cognition Comments: sometimes starts line of thinking and doesn't finish it, somewhat internally distracted Following commands:  Impaired Following commands impaired: Follows multi-step commands with increased time    Cueing Cueing Techniques: Verbal cues, Tactile cues  Exercises General Exercises - Lower Extremity Long Arc Quad: AROM, Right, 10 reps, Seated Heel Slides: AAROM, Right, 10 reps, Seated Hip ABduction/ADduction: AAROM, Right, 10 reps, Seated Other Exercises Other Exercises: standing R hip extension x10 Other Exercises: standing R knee flex x10    General Comments General comments (skin integrity, edema, etc.): SPO2 95% on RA, HR 96 bpm      Pertinent Vitals/Pain Pain Assessment Pain Assessment: Faces Faces Pain Scale: Hurts little more Pain Location: R hip Pain Descriptors / Indicators: Discomfort, Grimacing, Guarding, Operative site guarding Pain Intervention(s): Limited activity within patient's tolerance, Monitored during session    Home Living                          Prior Function            PT Goals (current goals can now be found in the care plan section) Acute Rehab PT Goals Patient Stated Goal: to improve PT Goal Formulation: With patient/family Time For Goal Achievement: 10/01/23 Potential to Achieve Goals: Good Progress towards PT goals: Progressing toward goals    Frequency    Min 3X/week      PT Plan      Co-evaluation              AM-PAC PT 6 Clicks Mobility   Outcome Measure  Help needed turning from your back to your side while in a flat bed without using bedrails?: A Little Help needed moving from lying on your back to sitting on the side of a flat bed without using bedrails?: A Little Help needed moving to and from a bed to a chair (including a wheelchair)?: A Little Help needed standing up from a chair using your arms (e.g., wheelchair or bedside chair)?: A Little Help needed to walk in hospital room?: A Lot Help needed climbing 3-5 steps with a railing? : Total 6 Click Score: 15    End of Session Equipment Utilized During  Treatment: Gait belt Activity Tolerance: Patient tolerated treatment well;Patient limited by pain Patient left: with call bell/phone within reach;with family/visitor present;with chair alarm set;in chair Nurse Communication: Mobility status PT Visit Diagnosis: Unsteadiness on feet (R26.81);Other abnormalities of gait and mobility (R26.89);Muscle weakness (generalized) (M62.81);History of falling (Z91.81);Difficulty in walking, not elsewhere classified (R26.2);Pain Pain - Right/Left: Right Pain - part of body: Hip     Time: 1245-1320 PT Time Calculation (min) (ACUTE ONLY): 35 min  Charges:    $Gait Training: 8-22 mins $Therapeutic Exercise: 8-22 mins PT General Charges $$ ACUTE PT VISIT: 1 Visit                     Richerd Lipoma, PT  Acute Rehab Services Secure chat preferred Office [phone]    Richerd CROME Brienna Bass 09/18/2023, 1:33 PM

## 2023-09-19 ENCOUNTER — Encounter (HOSPITAL_COMMUNITY): Payer: Self-pay | Admitting: Student

## 2023-09-19 ENCOUNTER — Other Ambulatory Visit (HOSPITAL_COMMUNITY): Payer: Self-pay

## 2023-09-19 LAB — CBC
HCT: 23.7 % — ABNORMAL LOW (ref 39.0–52.0)
Hemoglobin: 7.5 g/dL — ABNORMAL LOW (ref 13.0–17.0)
MCH: 27.1 pg (ref 26.0–34.0)
MCHC: 31.6 g/dL (ref 30.0–36.0)
MCV: 85.6 fL (ref 80.0–100.0)
Platelets: 194 K/uL (ref 150–400)
RBC: 2.77 MIL/uL — ABNORMAL LOW (ref 4.22–5.81)
RDW: 15.3 % (ref 11.5–15.5)
WBC: 5.3 K/uL (ref 4.0–10.5)
nRBC: 0 % (ref 0.0–0.2)

## 2023-09-19 MED ORDER — APIXABAN 5 MG PO TABS
5.0000 mg | ORAL_TABLET | Freq: Two times a day (BID) | ORAL | 3 refills | Status: DC
  Filled 2023-09-19: qty 180, 90d supply, fill #0

## 2023-09-19 MED ORDER — VITAMIN D (ERGOCALCIFEROL) 1.25 MG (50000 UNIT) PO CAPS
50000.0000 [IU] | ORAL_CAPSULE | ORAL | 0 refills | Status: AC
  Filled 2023-09-19: qty 5, 35d supply, fill #0

## 2023-09-19 MED ORDER — METHOCARBAMOL 500 MG PO TABS
500.0000 mg | ORAL_TABLET | Freq: Four times a day (QID) | ORAL | 0 refills | Status: DC | PRN
  Filled 2023-09-19: qty 28, 7d supply, fill #0

## 2023-09-19 MED ORDER — FLUCONAZOLE 200 MG PO TABS: 400.0000 mg | ORAL_TABLET | Freq: Every day | ORAL | Status: DC

## 2023-09-19 MED ORDER — ROLLATOR ULTRA-LIGHT MISC
0 refills | Status: DC
  Filled 2023-09-19: qty 1, fill #0

## 2023-09-19 MED ORDER — ACETAMINOPHEN 325 MG PO TABS
650.0000 mg | ORAL_TABLET | Freq: Three times a day (TID) | ORAL | 0 refills | Status: AC
  Filled 2023-09-19: qty 180, 30d supply, fill #0

## 2023-09-19 MED ORDER — ADULT MULTIVITAMIN W/MINERALS CH
1.0000 | ORAL_TABLET | Freq: Every day | ORAL | 0 refills | Status: AC
  Filled 2023-09-19: qty 30, 30d supply, fill #0

## 2023-09-19 NOTE — TOC Progression Note (Addendum)
 Transition of Care [facility]) - Progression Note    Patient Details  Name: David [name] MRN: [id number] Date of Birth: 11/07/1975  Transition of [facility]) CM/SW Contact  Corean JAYSON Canary, RN Phone Number: 09/19/2023, 12:55 PM  Clinical Narrative:     Patient wants home health versus CIR reached out to [facility], they cannot take. Do not know of any other HH agency that currently takes [facility]. Attempted to call the patient, could not get through. Discussed with providers. They do not feel it is a safe discharge if HH cannot be obtained. Provider will go speak to the patient again about CIR.   Reached out ot the following HH agencies  [facility]- cannot take [facility] out of network [facility]- cannot take [facility]- cannot take [facility] - not in network [facility]- Is checking [facility]- cannot take out of network   Reached out to [facility] at [phone] to  obtain Case Manager  reference [phone] redirected to [facility]- [phone] Spoke ot [facility]. He was not in one part of the system, she is working with IT and [employer]ch[employer], retail to see what Services they can provide, like PCS, PT and OT. She states she will get back with this RNCM  prior to close of Business.  1446 Rep called back and stated that  the patient does not have triluium but straight medicaid? Called her back regarding this there has to be a evaluation process through insurance to apply for PCS and Home health.  1550 Spoke with patient at bedside He has a trach with [employer] valve.  He states he wants to go home, discussed that we would not be able to provide home health. He stated he walked on his leg for a while and now its fixed so what is the difference. He is agreeable to OP PT and OT. Need to go follow with PCP post hospital 1640 His mother has agreed to  take him to OP OT PT ambulatory orders placed by MD.  Barriers to Discharge: Continued Medical Work up               Expected Discharge Plan and Services    Discharge Planning Services: CM Consult   Living arrangements for the past 2 months: Apartment                                       Social Drivers of Health (SDOH) Interventions SDOH Screenings   Food Insecurity: Patient Declined (09/15/2023)  Housing: Low Risk  (09/15/2023)  Transportation Needs: Patient Declined (09/15/2023)  Utilities: Patient Declined (09/15/2023)  Alcohol Screen: Low Risk  (03/19/2022)  Depression (PHQ2-9): Low Risk  (11/13/2022)  Recent Concern: Depression (PHQ2-9) - Medium Risk (09/12/2022)  Financial Resource Strain: Low Risk  (03/19/2022)  Physical Activity: Insufficiently Active (03/19/2022)  Social Connections: Socially Isolated (03/19/2022)  Stress: No Stress Concern Present (03/19/2022)  Tobacco Use: High Risk (09/17/2023)    Readmission Risk Interventions     No data to display

## 2023-09-19 NOTE — Procedures (Signed)
 Tracheostomy tube change:    Verbal timeout was performed prior to the procedure. The old  #7 uncuffed portex  trach was carefully removed. the tracheostomy site appeared: unremarkable. A new #  7Cuffless portex modified per his routine (cut short to allow for 4 inner cannula at home)  twas easily placed in the tracheostomy stoma and secured with velcro trach ties. The tracheostomy was patent, good color change observed via EZ-CAP, and the patient was easily able to voice with finger occlusion and tolerated the procedure well with no immediate complications.    Maude FORBES Banner ACNP-BC [facility] Pulmonary/Critical Care Pager # [phone] OR # [phone] if no answer

## 2023-09-19 NOTE — Progress Notes (Signed)
 HD#4 SUBJECTIVE:  Patient Summary: David [name] is a 48 y.o. 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  Overnight Events: none  Interim History:  patient slept well last night and his pain is 5 out of 10.  His coughing is a little bit better.  He states that thin liquids are more difficult to swallow than thick liquids.  Encourage patient to try to drink ensures.  Patient was eating his breakfast when we arrived.  Patient states he has a chronic runny nose but does not feel like he has aspirated or is getting a lung infection.  Discussed that we we will start his blood thinner likely tomorrow if his hemoglobin remains stable, emphasized the importance of taking this medication twice daily.  Discussed with patient doing inpatient rehab which is our recommendation versus outpatient with physical therapy.  Patient would prefer physical therapy at home.  OBJECTIVE:  Vital Signs: Vitals:   09/19/23 0050 09/19/23 0438 09/19/23 0505 09/19/23 0736  BP:  112/82  124/83  Pulse:  64  69  Resp:  16  16  Temp:  99.2 F (37.3 C)  (!) 97.4 F (36.3 C)  TempSrc:  Oral  Rectal  SpO2: 96% 100% 100% 97%  Weight:      Height:       Supplemental O2:   SpO2: 97 % O2 Flow Rate (L/min): 5 L/min FiO2 (%): 28 %  Filed Weights   09/15/23 0920 09/17/23 0841  Weight: 43 kg 43 kg     Intake/Output Summary (Last 24 hours) at 09/19/2023 0820 Last data filed at 09/18/2023 2125 Gross per 24 hour  Intake 320 ml  Output 400 ml  Net -80 ml   Net IO Since Admission: 110 mL [09/19/23 0820]  Physical Exam: Physical Exam Constitutional:      Appearance: Normal appearance.  HENT:     Nose: No congestion or  rhinorrhea.  Neck:     Comments: Rightward torticollis, tracheostomy in place and functioning Cardiovascular:     Rate and Rhythm: Normal rate and regular rhythm.     Pulses: Normal pulses.     Heart sounds: Normal heart sounds.     Comments: Bilateral positive dorsalis pedis pulse Pulmonary:     Effort: Pulmonary effort is normal.     Breath sounds: Normal breath sounds.  Abdominal:     General: Bowel sounds are normal.     Palpations: Abdomen is soft.  Musculoskeletal:        General: No swelling.     Comments: Right anterior thigh wound without signs of infection, clean bandage in place Bilateral lower extremities warm to touch  Skin:    General: Skin is warm.     Findings: No bruising.  Neurological:     Mental Status: He is alert and oriented to person, place, and time. Mental status is at baseline.  Psychiatric:        Mood and Affect: Mood normal.        Behavior: Behavior normal.     Patient Lines/Drains/Airways Status     Active Line/Drains/Airways     Name Placement date Placement time Site Days   Peripheral IV 12/13/22 20 G 1.88 Left;Anterior Forearm 12/13/22  1407  Forearm  277   Peripheral IV 09/15/23 20 G Right Antecubital 09/15/23  1333  Antecubital  1   Incision - 3 Ports Abdomen 1: Right;Lateral 2: Right;Lateral;Medial 3: Umbilicus 06/20/21  0805  -- 818   Tracheostomy Shiley Flexible 4 mm Uncuffed 08/15/21  1540  4 mm  762   Tracheostomy Shiley Flexible 4 mm Uncuffed 12/13/22  1302  4 mm  277   Tracheostomy  7 mm Uncuffed 09/15/23  1630  7 mm  1   Wound / Incision (Open or Dehisced) 09/14/21 Puncture Neck Right cervical lymph node bx site 09/14/21  1438  Neck  732            Pertinent labs and imaging:      Latest Ref Rng & Units 09/19/2023    4:21 AM 09/18/2023    4:59 AM 09/17/2023   12:29 PM  CBC  WBC 4.0 - 10.5 K/uL 5.3  5.2    Hemoglobin 13.0 - 17.0 g/dL 7.5  7.1  9.2   Hematocrit 39.0 - 52.0 % 23.7  21.9  27.0   Platelets 150 - 400 K/uL  194  193         Latest Ref Rng & Units 09/18/2023    8:43 AM 09/18/2023    4:59 AM 09/17/2023   12:29 PM  CMP  Glucose 70 - 99 mg/dL 86  860    BUN 6 - 20 mg/dL 13  14    Creatinine 9.38 - 1.24 mg/dL 9.38  9.33    Sodium 864 - 145 mmol/L 137  133  136   Potassium 3.5 - 5.1 mmol/L 4.1  3.8  4.3   Chloride 98 - 111 mmol/L 100  98    CO2 22 - 32 mmol/L 28  26    Calcium 8.9 - 10.3 mg/dL 8.8  8.7      No results found.    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  Patient is now s/p definitive total hip arthroplasty on 8/20.   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 Kendal recommends weightbearing as tolerated. Monitor hemoglobin with plans to restart apixaban  once hemoglobin is stable, hemoglobin 7.1 the day after surgery and is 7.5 today. Plan to restart home apixaban  5 mg twice daily tomorrow pending hemoglobin stabilization.  Courage patient to drink ensures.  PT, OT, and providers recommend CIR, patient would prefer home with therapy although his insurance may not cover home health.  Continue to encourage patient to consider CIR. 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   - Lidocaine  mouth solution every 6 hours as needed -  Sodium chloride  nasal spray as needed -Guaifenesin  tablet 2 times daily as needed - RT following for trach care, plan to place new tracheostomy prior to discharge   #Chronic pain syndrome Patient's pain today was 5 out of 10, improving. - Continue methadone  130 mg daily - Continue pregabalin  50 mg 3 times daily - Breakthrough pain medication ordered   #History of fungal endocarditis Did not appreciate any new murmurs on exam. - Continue home fluconazole  400 mg daily   #History of right IJ vein occlusion  Will continue when hemoglobin is stable, likely tomorrow.  #Anxiety Continue home sertraline  25 mg daily  Best Practice: Diet: Regular diet IVF: Fluids: VTE: SCDs Start: 09/17/23 1401 Code: Full  Disposition planning: Therapy Recs: Pending, DME: other pending Family Contact: mother, to be notified. DISPO: Anticipated discharge tomorrow pending discharge therapy plans.  Signature:  Viktoria Charmayne [facility]  8:20 AM, 09/19/2023  On Call pager [phone]

## 2023-09-19 NOTE — Discharge Instructions (Addendum)
 Orthopaedic Trauma Service Discharge Instructions   General Discharge Instructions  WEIGHT BEARING STATUS: Weightbearing as tolerated  RANGE OF MOTION/ACTIVITY: Unrestricted range of motion right lower extremity  Wound Care: Keep Aquacel dressing in place until follow-up.  If dressing becomes soiled or water  is caught beneath the seal, please remove dressing and clean incision gently with soap and water  before applying a clean dry dressing.  You are okay to shower, Aquacel dressing is waterproof and may get wet.  DVT/PE prophylaxis: Resume your home dose Eliquis   Diet: as you were eating previously.  Can use over the counter stool softeners and bowel preparations, such as Miralax , to help with bowel movements.  Narcotics can be constipating.  Be sure to drink plenty of fluids  PAIN MEDICATION USE AND EXPECTATIONS  You have likely been given narcotic medications to help control your pain.  After a traumatic event that results in an fracture (broken bone) with or without surgery, it is ok to use narcotic pain medications to help control one's pain.  We understand that everyone responds to pain differently and each individual patient will be evaluated on a regular basis for the continued need for narcotic medications. Ideally, narcotic medication use should last no more than 6-8 weeks (coinciding with fracture healing).   As a patient it is your responsibility as well to monitor narcotic medication use and report the amount and frequency you use these medications when you come to your office visit.   We would also advise that if you are using narcotic medications, you should take a dose prior to therapy to maximize you participation.  IF YOU ARE ON NARCOTIC MEDICATIONS IT IS NOT PERMISSIBLE TO OPERATE A MOTOR VEHICLE (MOTORCYCLE/CAR/TRUCK/MOPED) OR HEAVY MACHINERY DO NOT MIX NARCOTICS WITH OTHER CNS (CENTRAL NERVOUS SYSTEM) DEPRESSANTS SUCH AS ALCOHOL  POST-OPERATIVE OPIOID TAPER  INSTRUCTIONS: It is important to wean off of your opioid medication as soon as possible. If you do not need pain medication after your surgery it is ok to stop day one. Opioids include: Codeine, Hydrocodone (Norco, Vicodin), Oxycodone (Percocet, oxycontin ) and hydromorphone  amongst others.  Long term and even short term use of opiods can cause: Increased pain response Dependence Constipation Depression Respiratory depression And more.  Withdrawal symptoms can include Flu like symptoms Nausea, vomiting And more Techniques to manage these symptoms Hydrate well Eat regular healthy meals Stay active Use relaxation techniques(deep breathing, meditating, yoga) Do Not substitute Alcohol to help with tapering If you have been on opioids for less than two weeks and do not have pain than it is ok to stop all together.  Plan to wean off of opioids This plan should start within one week post op of your fracture surgery  Maintain the same interval or time between taking each dose and first decrease the dose.  Cut the total daily intake of opioids by one tablet each day Next start to increase the time between doses. The last dose that should be eliminated is the evening dose.    STOP SMOKING OR USING NICOTINE  PRODUCTS!!!!  As discussed nicotine  severely impairs your body's ability to heal surgical and traumatic wounds but also impairs bone healing.  Wounds and bone heal by forming microscopic blood vessels (angiogenesis) and nicotine  is a vasoconstrictor (essentially, shrinks blood vessels).  Therefore, if vasoconstriction occurs to these microscopic blood vessels they essentially disappear and are unable to deliver necessary nutrients to the healing tissue.  This is one modifiable factor that you can do to dramatically increase your chances  of healing your injury.  (This means no smoking, no nicotine  gum, patches, etc)  ICE AND ELEVATE INJURED/OPERATIVE EXTREMITY  Using ice and elevating the  injured extremity above your heart can help with swelling and pain control.  Icing in a pulsatile fashion, such as 20 minutes on and 20 minutes off, can be followed.    Do not place ice directly on skin. Make sure there is a barrier between to skin and the ice pack.    Using frozen items such as frozen peas works well as the conform nicely to the are that needs to be iced.  USE AN ACE WRAP OR TED HOSE FOR SWELLING CONTROL  In addition to icing and elevation, Ace wraps or TED hose are used to help limit and resolve swelling.  It is recommended to use Ace wraps or TED hose until you are informed to stop.    When using Ace Wraps start the wrapping distally (farthest away from the body) and wrap proximally (closer to the body)   Example: If you had surgery on your leg or thing and you do not have a splint on, start the ace wrap at the toes and work your way up to the thigh        If you had surgery on your upper extremity and do not have a splint on, start the ace wrap at your fingers and work your way up to the upper arm  CALL THE OFFICE FOR MEDICATION REFILLS OR WITH ANY QUESTIONS/CONCERNS: [phone]   VISIT OUR WEBSITE FOR ADDITIONAL INFORMATION: [email or web address]    Call office for the following: Temperature greater than 101F Persistent nausea and vomiting Severe uncontrolled pain Redness, tenderness, or signs of infection (pain, swelling, redness, odor or green/yellow discharge around the site) Difficulty breathing, headache or visual disturbances Hives Persistent dizziness or light-headedness Extreme fatigue Any other questions or concerns you may have after discharge  In an emergency, call 911 or go to an Emergency Department at a nearby hospital  OTHER HELPFUL INFORMATION  You should wean off your narcotic medicines as soon as you are able.  Most patients will be off or using minimal narcotics before their first postop appointment.   We suggest you use the pain medication the  first night prior to going to bed, in order to ease any pain when the anesthesia wears off. You should avoid taking pain medications on an empty stomach as it will make you nauseous.  Do not drink alcoholic beverages or take illicit drugs when taking pain medications.  In most states it is against the law to drive while you are in a splint or sling.  And certainly against the law to drive while taking narcotics.  You may return to work/school in the next couple of days when you feel up to it.   Pain medication may make you constipated.  Below are a few solutions to try in this order: Decrease the amount of pain medication if you aren't having pain. Drink lots of decaffeinated fluids. Drink prune juice and/or each dried prunes  If the first 3 don't work start with additional solutions Take Colace - an over-the-counter stool softener Take Senokot - an over-the-counter laxative Take Miralax  - a stronger over-the-counter laxative   ----------------------------- Thank you for allowing us  to be part of your care. You were hospitalized for right hip fracture. We treated you with surgery and pain medication.  See the changes in your medications and management of your chronic conditions below:  *  For your right hip fracture -We have STARTED you on these following medications:  - Vitamin D  50,000 units, 1 tablet once a week for the next 5 weeks  - Methocarbamol  500 mg tablet, every 6 hours as needed  - Acetaminophen  650 mg, every 8 hours as needed   - Restart your Eliquis  5 mg twice daily tomorrow, 8/23  FOLLOW UP APPOINTMENTS: Please follow-up with your primary care doctor at the internal medicine clinic. Please follow-up with your orthopedic surgeon, Dr. Kendal for wound care and repeat x-rays. Please follow-up with [facility]. Please follow-up with outpatient physical therapy and outpatient occupational therapy. Lastly, please follow-up with pulmonology for tracheostomy  care.  Please call your PCP or our clinic if you have any questions or concerns, we may be able to help and keep you from a long and expensive emergency room wait. Our clinic and after hours phone number is [phone]. The best time to call is Monday through Friday 9 am to 4 pm but there is always someone available 24/7 if you have an emergency. If you need medication refills please notify your pharmacy one week in advance and they will send us  a request.   We are glad you are feeling better,  Viktoria King Internal Medicine Inpatient Teaching Service at [facility]

## 2023-09-19 NOTE — Progress Notes (Addendum)
 Orthopaedic Trauma Progress Note  SUBJECTIVE: Doing ok this morning.  Rates pain about 7/10.  Utilizing his home dose of methadone  as well as PRN Robaxin  and oxycodone .  Making progress with therapies, was able to mobilize about 56' yesterday.  No chest pain. No SOB. No nausea/vomiting. No other complaints. Tolerating diet. No family at side currently.  Patient would prefer to discharge home but is agreeable to considering CIR if that is an option.  OBJECTIVE:  Vitals:   09/19/23 0505 09/19/23 0736  BP:  124/83  Pulse:  69  Resp:  16  Temp:  (!) 97.4 F (36.3 C)  SpO2: 100% 97%    Opiates Today (MME): Today's  total administered Morphine Milligram Equivalents: 15 Opiates Yesterday (MME): Yesterday's total administered Morphine Milligram Equivalents: 555  General: Sitting up in bed.  No acute distress Respiratory: No increased work of breathing.  Operative Extremity (RLE): Aquacel dressing CDI.  No significant tenderness with palpation over the hip or throughout the thigh.  No calf tenderness.  Compartment soft and compressible.  Ankle DF/PF intact.  Toes warm well-perfused. + DP pulse  IMAGING: Stable post op imaging right hip  LABS:  Results for orders placed or performed during the hospital encounter of 09/15/23 (from the past 24 hours)  Basic metabolic panel     Status: Abnormal   Collection Time: 09/18/23  8:43 AM  Result Value Ref Range   Sodium 137 135 - 145 mmol/L   Potassium 4.1 3.5 - 5.1 mmol/L   Chloride 100 98 - 111 mmol/L   CO2 28 22 - 32 mmol/L   Glucose, Bld 86 70 - 99 mg/dL   BUN 13 6 - 20 mg/dL   Creatinine, Ser 9.38 0.61 - 1.24 mg/dL   Calcium 8.8 (L) 8.9 - 10.3 mg/dL   GFR, Estimated >39 >39 mL/min   Anion gap 9 5 - 15  CBC     Status: Abnormal   Collection Time: 09/19/23  4:21 AM  Result Value Ref Range   WBC 5.3 4.0 - 10.5 K/uL   RBC 2.77 (L) 4.22 - 5.81 MIL/uL   Hemoglobin 7.5 (L) 13.0 - 17.0 g/dL   HCT 76.2 (L) 60.9 - 47.9 %   MCV 85.6 80.0 - 100.0  fL   MCH 27.1 26.0 - 34.0 pg   MCHC 31.6 30.0 - 36.0 g/dL   RDW 84.6 88.4 - 84.4 %   Platelets 194 150 - 400 K/uL   nRBC 0.0 0.0 - 0.2 %    ASSESSMENT: David [name] is a 48 y.o. male, 2 Days Post-Op s/p fall about 3 weeks ago with delayed presentation  Procedures: RIGHT TOTAL HIP ARTHROPLASTY FOR FRACTURE, ANTERIOR APPROACH  CV/Blood loss: Acute blood loss anemia, Hgb 7.5 this AM. Hemodynamically stable  PLAN: Weightbearing: WBAT RLE ROM: Unrestricted ROM.  No hip precautions needed Incisional and dressing care: Dressings left intact until follow-up  Showering: Okay to begin showering 09/19/2023.  Aquacel dressing may get wet Orthopedic device(s): None  Pain management:  1. Tylenol  650 mg 3 times daily 2. Robaxin  500 mg q 6 hours PRN 3. Oxycodone  5-10 mg q 4 hours PRN 4. Dilaudid  0.5-1 mg q 2 hours PRN 5. Methadone  130 mg daily 6. Lyrica  50 mg 3 times daily VTE prophylaxis: Recommend holding home dose Eliquis  1 more day to allow Hgb to stabilize. Continue SCDs ID:  Ancef  2gm post op completed Foley/Lines:  No foley, KVO IVFs Impediments to Fracture Healing: Vitamin D  level 11, started on  supplementation Dispo: PT/OT evaluation ongoing, currently recommending CIR.  Admission coordinator plans follow-up with patient today.  Continue to monitor CBC, restart Eliquis  once hemoglobin is stabilized.    D/C recommendations: - Continue home dose Lyrica  and methadone  for primary pain control.  Recommend short course of oxycodone  and Robaxin  - Home dose Eliquis  for DVT prophylaxis - Continue 50,000 units Vit D supplementation weekly x 5 weeks  Follow - up plan: 2 weeks after d/c for wound check and repeat x-rays   Contact information:  Franky Light MD, Lauraine Moores PA-C. After hours and holidays please check [email or web address] for group call information for [facility]   Lauraine PATRIC Moores, PA-C [phone] (office) [email or web address]

## 2023-09-19 NOTE — Discharge Summary (Signed)
 Name: David [name] MRN: [id number] DOB: Oct 22, 1975 48 y.o. PCP: Kandis Perkins, DO  Date of Admission: 09/15/2023  9:12 AM Date of Discharge: 09/19/23 Attending Physician: Dr. Mliss Foot  Discharge Diagnosis: 1. Principal Problem:   Closed right hip fracture, initial encounter [facility]) Active Problems:   Severe protein-calorie malnutrition (HCC)   Tracheostomy dependent (HCC)   Tracheostomy in place [facility])    Discharge Medications: Allergies as of 09/19/2023   No Known Allergies      Medication List     TAKE these medications    acetaminophen  325 MG tablet Commonly known as: TYLENOL  Take 2 tablets (650 mg total) by mouth 3 (three) times daily.   apixaban  5 MG Tabs tablet Commonly known as: Eliquis  Take 1 tablet (5 mg total) by mouth 2 (two) times daily. Start taking on: September 20, 2023 What changed: when to take this   CertaVite/Antioxidants Tabs Take 1 tablet by mouth daily. Start taking on: September 20, 2023   chlorhexidine  4 % external liquid Commonly known as: HIBICLENS  Apply 15 mLs (1 Application total) topically as directed for 30 doses. Use as directed daily for 5 days every other week for 6 weeks.   diazePAM  5 MG/5ML Soln Take 3 mLs (3 mg total) by mouth every 8 (eight) hours as needed. TAKE 3 MLS (3 MG TOTAL) BY MOUTH EVERY 8 (EIGHT) HOURS AS NEEDED FOR MUSCLE SPASMS (DYSPHAGIA).   fluconazole  200 MG tablet Commonly known as: DIFLUCAN  Take 2 tablets (400 mg total) by mouth daily. Start taking on: September 20, 2023 What changed: See the new instructions.   lidocaine  2 % solution Commonly known as: XYLOCAINE  Use as directed 15 mLs in the mouth or throat every 3 (three) hours as needed for mouth pain. Mix 1part 2% viscous lidocaine , 1part water . Swish and spit 30mL of total diluted mixture up to eight times a day, prn as needed for soreness   methadone  10 MG/ML solution Commonly known as: DOLOPHINE  Take 130 mg by mouth every 8 (eight) hours.    methocarbamol  500 MG tablet Commonly known as: ROBAXIN  Take 1 tablet (500 mg total) by mouth every 6 (six) hours as needed for muscle spasms.   mupirocin  ointment 2 % Commonly known as: BACTROBAN  Place 1 Application into the nose 2 (two) times daily for 60 doses. Use as directed 2 times daily for 5 days every other week for 6 weeks.   ondansetron  4 MG tablet Commonly known as: Zofran  Take 1 tablet (4 mg total) by mouth every 8 (eight) hours as needed for nausea or vomiting.   Pregabalin  20 MG/ML Soln Take 50 mg by mouth 3 (three) times daily.   Rollator Ultra-Light Misc Use as needed   sertraline  20 MG/ML concentrated solution Commonly known as: ZOLOFT  MIX 1.25 MLS (25 MG TOTAL) WITH 1/2 CUP OF WATER  AND TAKE BY MOUTH DAILY.   Vitamin D  (Ergocalciferol ) 1.25 MG (50000 UNIT) Caps capsule Commonly known as: DRISDOL  Take 1 capsule (50,000 Units total) by mouth every 7 (seven) days. Start taking on: September 23, 2023               Durable Medical Equipment  (From admission, onward)           Start     Ordered   09/19/23 1431  For home use only DME Other see comment  Once       Comments: Rollator  Question:  Length of Need  Answer:  12 Months   09/19/23 1430  Discharge Care Instructions  (From admission, onward)           Start     Ordered   09/19/23 0000  Discharge wound care:        09/19/23 1625            Disposition and follow-up:   [name] was discharged from [facility] in Stable condition.  At the hospital follow up visit please address:  1.   - Right femur fracture status post surgery: Ensure wound is healing well, ensure patient is ambulating, ensure pain is tolerated, repeat x-rays with Dr. Kendal.  Follow-up outpatient physical therapy and outpatient occupational therapy. - Squamous cell carcinoma and tracheostomy: Ensure patient is tolerating oral food without aspiration.  Assess for  signs of upper respiratory infection or pneumonia. - History of right IJ vein occlusion: Ensure patient is taking apixaban  5 mg twice daily. 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  2.  Labs / imaging needed at time of follow-up: X-rays with orthopedic surgery  3.  Pending labs/ test needing follow-up: None  Follow-up Appointments:  Follow-up Information     Kandis Perkins, DO Follow up.   Specialty: Internal Medicine Contact information: [address] [place], [address] [place] [address] [phone]         [facility] Follow up.   Why: Community education officer information: [address] [place],  [place]  [address] Main: [phone] Fax: [phone]        [facility] Follow up.   Contact information: [address] [place] [address] [phone]         Kendal Franky SQUIBB, MD. Schedule an appointment as soon as possible for a visit in 2 week(s).   Specialty: Orthopedic Surgery Why: For wound check and repeat x-rays right hip Contact information: 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  CT imaging showing femur  fracture. Was not using assistive devices prior to this fall 3 weeks ago.  Orthopedics was engaged.  Patient's Eliquis  was held and bridged with enoxaparin  pending definitive surgery.  While in hospital, Multimodal pain control with Tylenol  650 mg 3 times daily, Robaxin  500 mg q 6 hours PRN, Oxycodone  5-10 mg q 4 hours PRN, Dilaudid  0.5-1 mg q 2 hours PRN.  Total hip arthroplasty was completed on 8/28.  Postop day 1 patient reported pain 7 out of 10, postop day 2 patient reported pain 5 out of 10.  It does not appear patient has a history of long-term steroid use, although patient is severely malnourished with the potential for vitamin deficiencies with resulting bone breakdown.  Calcium and phosphorus are normal although  vitamin D25 hydroxy 11.67.  Started vitamin D  supplementation, while in hospital and will continue on discharge.patient has completed 1 vitamin D  supplementation of 50,000 units while in hospital, continue for 5 more weeks after discharge.  Will follow-up for ground-level fall and fracture, osteoporosis, in outpatient setting. Dr. Kendal recommends weightbearing as tolerated and restarting home Eliquis  5 mg twice daily once hemoglobins have stabilized after surgery. hemoglobin was 10.7 before surgery, it is 7.1-day after surgery, and 7.5 on day of discharge.  Recommended to restart apixaban  5 mg twice daily tomorrow 8/23. Follow-up with Dr. Kendal 2 weeks after discharge for wound check and repeat x-rays.  PT, OT, and provider teams while in the hospital recommend CIR, patient prefers home health.  Difficulty obtaining insurance coverage for home health.  Despite efforts to have patient reconsider CIR, patient would like to be discharged today home without a plan for home health.  Case manager continues to try and get home health covered by his insurance.   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 He has completed radiation therapy to the larynx on 02/09/2021, He follows with ENT at [facility] and follows with trach clinic.  No acute concerns during hospitalization. Lidocaine  mouth solution, sodium chloride  nasal spray, and Mucinex  was given during hospital admission.  Respiratory therapy was following for tracheostomy care, they are replacing tracheostomy on day of discharge. Complicated by severe torticollis, has been recommended to pursue a G-tube but patient prefers not to.  Speech saw him while in the hospital.  Speech recommends encouraging PEG.  Speech recommends continuing the current diet and crushing his medicines with pure. Speech evaluated him again after surgery, no incidence of nasal regurgitation, and minimal mobility of the larynx, intermittent aspiration, multiple follow-up swallows needed to move bolus into throat.   #Chronic pain syndrome Patient follows palliative care for chronic pain.  He is currently on methadone  at home.  His current dose includes 130 mg of methadone  daily as well as pregabalin  50 mg 3 times daily. Home medications were continued in hospital and will be continued on discharge.  Patient's pain was controlled during admission.   #History of fungal endocarditis Patient has a history of fungal endocarditis. He follows with Dr. Overton of ID.  Per his last note on 01/07/2023, patient to be on 400 mg of fluconazole  daily.  He is to continue this indefinitely.  There has been some charting inconsistency between 400 and 800 daily, confirmed with patient it is 400 mg daily.  Appreciate no murmurs while in hospital. Continued home fluconazole  400 mg daily hospital and on discharge.   #History of right IJ vein occlusion Patient has a past medical history of right IJ vein occlusion.  This was diagnosed on admission in 09/04/2022.  Patient encouraged to be on Eliquis  indefinitely.  In anticipation of surgery, held home apixaban   and bridged with enoxaparin .  Plan to restart home 5 mg twice daily apixaban  tomorrow, 8/23.    Discharge Exam:   BP 92/71 (BP Location: Left Arm)   Pulse 98   Temp 99.1 F (37.3 C) (Rectal)   Resp 18   Ht 5' 10 (1.778 m)   Wt 43 kg   SpO2 98%   BMI 13.60 kg/m  Discharge exam:  Constitutional:      Appearance: Normal appearance.  HENT:     Nose: No congestion or rhinorrhea.  Neck:     Comments: Rightward torticollis, tracheostomy in place and functioning Cardiovascular:     Rate and Rhythm: Normal rate and regular rhythm.     Pulses: Normal pulses.     Heart sounds: Normal heart sounds.     Comments: Bilateral positive dorsalis pedis pulse Pulmonary:     Effort: Pulmonary effort is normal.     Breath sounds: Normal breath sounds.  Abdominal:     General: Bowel sounds are normal.     Palpations: Abdomen is soft.  Musculoskeletal:        General: No swelling.     Comments: Right anterior thigh wound without signs of infection, clean bandage in place Bilateral lower extremities warm to touch  Skin:    General: Skin is warm.     Findings: No bruising.  Neurological:     Mental Status: He is alert and oriented to person, place, and time. Mental status is at baseline.  Psychiatric:        Mood and Affect: Mood normal.        Behavior: Behavior normal.   Pertinent Labs, Studies, and Procedures:     Latest Ref Rng & Units 09/19/2023    4:21 AM 09/18/2023    4:59 AM 09/17/2023   12:29 PM  CBC  WBC 4.0 - 10.5 K/uL 5.3  5.2    Hemoglobin 13.0 - 17.0 g/dL 7.5  7.1  9.2   Hematocrit 39.0 - 52.0 % 23.7  21.9  27.0   Platelets 150 - 400 K/uL 194  193         Latest Ref Rng & Units 09/18/2023    8:43 AM 09/18/2023    4:59 AM 09/17/2023   12:29 PM  CMP  Glucose 70 - 99 mg/dL 86  860    BUN 6 - 20 mg/dL 13  14    Creatinine 9.38 - 1.24 mg/dL 9.38  9.33    Sodium 864 - 145 mmol/L 137  133  136   Potassium 3.5 - 5.1 mmol/L 4.1  3.8  4.3   Chloride 98 - 111 mmol/L 100  98     CO2 22 - 32 mmol/L 28  26    Calcium 8.9 - 10.3 mg/dL 8.8  8.7      CT Hip Right Wo Contrast Result Date: 09/15/2023 CLINICAL DATA:  Fall.  Proximal femoral fracture. EXAM: CT OF THE RIGHT HIP WITHOUT CONTRAST TECHNIQUE: Multidetector CT imaging of the right hip was performed according to the standard protocol. Multiplanar CT image reconstructions were also generated. RADIATION DOSE REDUCTION: This exam was performed according to the departmental dose-optimization program which includes automated exposure control, adjustment of the mA and/or kV according to patient size and/or use of iterative reconstruction technique. COMPARISON:  Same-day pelvic radiograph dated 09/15/2023. FINDINGS: Bones/Joint/Cartilage Acute comminuted subcapital fracture of the right femoral head and neck junction with approximately 1.5 cm of lateral displacement of the distal fracture component and 2.8 cm of impaction. No intertrochanteric extension. The right femoral head is seated within the acetabulum. Nondisplaced fracture of the right inferior pubic ramus demonstrates partial sclerotic margins with suspected marginal callus formation, suggestive of a subacute to chronic fracture (series 5, images 16-18). Pubic symphysis is anatomically aligned. Ligaments Ligaments are suboptimally evaluated by CT. Muscles and Tendons No loculated intramuscular collection. Soft tissue Soft tissue edema along the right hip. No fluid collection. No soft tissue mass. IMPRESSION: 1. Acute comminuted, displaced, and impacted subcapital fracture of the right femoral head  and neck junction. 2. Nondisplaced fracture of the right inferior pubic ramus demonstrates partial sclerotic margins with suspected marginal callus formation, suggestive of a subacute to chronic fracture Electronically Signed   By: Harrietta Sherry M.D.   On: 09/15/2023 12:23   DG Pelvis Portable Result Date: 09/15/2023 CLINICAL DATA:  Hip pain.  Fall 3 days ago. EXAM: PORTABLE PELVIS  1-2 VIEWS COMPARISON:  None Available. FINDINGS: Acute comminuted fracture of the right proximal femoral head and neck junction with superior displacement of the distal fracture component and associated impaction. The right femoral head appears seated in the acetabulum. Evaluation of the sacrum is somewhat limited by overlying bowel-gas. Pubic symphysis is anatomically aligned. IMPRESSION: Acute comminuted, displaced, and foreshortened fracture of the right proximal femoral head and neck junction. Intertrochanteric extension cannot be excluded. Consider CT for further evaluation. Electronically Signed   By: Harrietta Sherry M.D.   On: 09/15/2023 10:53   DG Knee Right Port Result Date: 09/15/2023 CLINICAL DATA:  Fall. EXAM: PORTABLE RIGHT KNEE - 1-2 VIEW COMPARISON:  None Available. FINDINGS: No evidence of fracture, dislocation, or joint effusion. Focus of serpiginous sclerotic change in the proximal tibial metadiaphysis likely reflects osteonecrosis. No aggressive features. Mild medial femorotibial compartment joint space narrowing. Soft tissues are unremarkable. IMPRESSION: 1. No acute osseous abnormality. 2. Mild medial femorotibial compartment joint space narrowing. 3. Focus of serpiginous sclerotic change in the proximal tibial metadiaphysis likely reflects osteonecrosis. Electronically Signed   By: Harrietta Sherry M.D.   On: 09/15/2023 10:49   DG Chest Portable 1 View Result Date: 09/15/2023 EXAM: 1 VIEW XRAY OF THE CHEST 09/15/2023 10:44:38 AM COMPARISON: 09/03/2022 CLINICAL HISTORY: Fall ?R hip fx. Fall ?R hip fx; Per notes: Patient to ED by POV with c/o fall. Patient states he fell 3 days ago causing pain to right hip. He denies LOC but voices being on thinners, unaware of what thinner he is on. FINDINGS: LUNGS AND PLEURA: Biapical pleural thickening. No focal pulmonary opacity. No pulmonary edema. No pleural effusion. No pneumothorax. HEART AND MEDIASTINUM: Tracheostomy terminates at the level of  the clavicles. No acute abnormality of the cardiac and mediastinal silhouettes. BONES AND SOFT TISSUES: Patient is rotated to the right. Multiple wires and leads project over the chest on the frontal radiograph. No acute osseous abnormality. IMPRESSION: 1. No acute process. 2. Physician degradation Electronically signed by: Rockey Kilts MD 09/15/2023 10:49 AM EDT RP Workstation: [id number]     Discharge Instructions: Discharge Instructions     Ambulatory referral to Occupational Therapy   Complete by: As directed    Ambulatory referral to Physical Therapy   Complete by: As directed    Call MD for:  redness, tenderness, or signs of infection (pain, swelling, redness, odor or green/yellow discharge around incision site)   Complete by: As directed    Call MD for:  severe uncontrolled pain   Complete by: As directed    Call MD for:  temperature >100.4   Complete by: As directed    Diet general   Complete by: As directed    Discharge instructions   Complete by: As directed    Orthopaedic Trauma Service Discharge Instructions   General Discharge Instructions  WEIGHT BEARING STATUS: Weightbearing as tolerated  RANGE OF MOTION/ACTIVITY: Unrestricted range of motion right lower extremity  Wound Care: Keep Aquacel dressing in place until follow-up.  If dressing becomes soiled or water  is caught beneath the seal, please remove dressing and clean incision gently with soap and water  before  applying a clean dry dressing.  You are okay to shower, Aquacel dressing is waterproof and may get wet.  DVT/PE prophylaxis:  Resume your home dose Eliquis   Diet: as you were eating previously.  Can use over the counter stool softeners and bowel preparations, such as Miralax , to help with bowel movements.  Narcotics can be constipating.  Be sure to drink plenty of fluids  PAIN MEDICATION USE AND EXPECTATIONS  You have likely been given narcotic medications to help control your pain.  After a traumatic event  that results in an fracture (broken bone) with or without surgery, it is ok to use narcotic pain medications to help control one's pain.  We understand that everyone responds to pain differently and each individual patient will be evaluated on a regular basis for the continued need for narcotic medications. Ideally, narcotic medication use should last no more than 6-8 weeks (coinciding with fracture healing).   As a patient it is your responsibility as well to monitor narcotic medication use and report the amount and frequency you use these medications when you come to your office visit.   We would also advise that if you are using narcotic medications, you should take a dose prior to therapy to maximize you participation.  IF YOU ARE ON NARCOTIC MEDICATIONS IT IS NOT PERMISSIBLE TO OPERATE A MOTOR VEHICLE (MOTORCYCLE/CAR/TRUCK/MOPED) OR HEAVY MACHINERY DO NOT MIX NARCOTICS WITH OTHER CNS (CENTRAL NERVOUS SYSTEM) DEPRESSANTS SUCH AS ALCOHOL  POST-OPERATIVE OPIOID TAPER INSTRUCTIONS:  It is important to wean off of your opioid medication as soon as possible. If you do not need pain medication after your surgery it is ok to stop day one.  Opioids include:  o Codeine, Hydrocodone (Norco, Vicodin), Oxycodone (Percocet, oxycontin ) and hydromorphone  amongst others.   Long term and even short term use of opiods can cause:  o Increased pain response  o Dependence  o Constipation  o Depression  o Respiratory depression  o And more.   Withdrawal symptoms can include  o Flu like symptoms  o Nausea, vomiting  o And more  Techniques to manage these symptoms  o Hydrate well  o Eat regular healthy meals  o Stay active  o Use relaxation techniques(deep breathing, meditating, yoga)  Do Not substitute Alcohol to help with tapering  If you have been on opioids for less than two weeks and do not have pain than it is ok to stop all together.   Plan to wean off of opioids  o This plan should start  within one week post op of your fracture surgery   o Maintain the same interval or time between taking each dose and first decrease the dose.   o Cut the total daily intake of opioids by one tablet each day  o Next start to increase the time between doses.  o The last dose that should be eliminated is the evening dose.    STOP SMOKING OR USING NICOTINE  PRODUCTS!!!!  As discussed nicotine  severely impairs your body's ability to heal surgical and traumatic wounds but also impairs bone healing.  Wounds and bone heal by forming microscopic blood vessels (angiogenesis) and nicotine  is a vasoconstrictor (essentially, shrinks blood vessels).  Therefore, if vasoconstriction occurs to these microscopic blood vessels they essentially disappear and are unable to deliver necessary nutrients to the healing tissue.  This is one modifiable factor that you can do to dramatically increase your chances of healing your injury.  (This means no smoking, no nicotine  gum, patches, etc)  ICE AND ELEVATE INJURED/OPERATIVE EXTREMITY  Using ice and elevating the injured extremity above your heart can help with swelling and pain control.  Icing in a pulsatile fashion, such as 20 minutes on and 20 minutes off, can be followed.    Do not place ice directly on skin. Make sure there is a barrier between to skin and the ice pack.    Using frozen items such as frozen peas works well as the conform nicely to the are that needs to be iced.  USE AN ACE WRAP OR TED HOSE FOR SWELLING CONTROL  In addition to icing and elevation, Ace wraps or TED hose are used to help limit and resolve swelling.  It is recommended to use Ace wraps or TED hose until you are informed to stop.    When using Ace Wraps start the wrapping distally (farthest away from the body) and wrap proximally (closer to the body)   Example: If you had surgery on your leg or thing and you do not have a splint on, start the ace wrap at the toes and work your way up to the  thigh        If you had surgery on your upper extremity and do not have a splint on, start the ace wrap at your fingers and work your way up to the upper arm  CALL THE OFFICE FOR MEDICATION REFILLS OR WITH ANY QUESTIONS/CONCERNS: [phone]   VISIT OUR WEBSITE FOR ADDITIONAL INFORMATION: [email or web address]    Call office for the following: ? Temperature greater than 101F ? Persistent nausea and vomiting ? Severe uncontrolled pain ? Redness, tenderness, or signs of infection (pain, swelling, redness, odor or green/yellow discharge around the site) ? Difficulty breathing, headache or visual disturbances ? Hives ? Persistent dizziness or light-headedness ? Extreme fatigue ? Any other questions or concerns you may have after discharge  In an emergency, call 911 or go to an Emergency Department at a nearby hospital  OTHER HELPFUL INFORMATION  ? You should wean off your narcotic medicines as soon as you are able.  Most patients will be off or using minimal narcotics before their first postop appointment.   ? We suggest you use the pain medication the first night prior to going to bed, in order to ease any pain when the anesthesia wears off. You should avoid taking pain medications on an empty stomach as it will make you nauseous.  ? Do not drink alcoholic beverages or take illicit drugs when taking pain medications.  ? In most states it is against the law to drive while you are in a splint or sling.  And certainly against the law to drive while taking narcotics.  ? You may return to work/school in the next couple of days when you feel up to it.   ? Pain medication may make you constipated.  Below are a few solutions to try in this order:   ? Decrease the amount of pain medication if you aren't having pain.   ? Drink lots of decaffeinated fluids.   ? Drink prune juice and/or each dried prunes   o If the first 3 don't work start with additional solutions   ? Take Colace - an  over-the-counter stool softener   ? Take Senokot - an over-the-counter laxative   ? Take Miralax  - a stronger over-the-counter laxative   ----------------------------- Thank you for allowing us  to be part of your care. You were hospitalized for right hip fracture. We treated you  with surgery and pain medication.  See the changes in your medications and management of your chronic conditions below:  *For your right hip fracture -We have STARTED you on these following medications:  - Vitamin D  50,000 units, 1 tablet once a week for the next 5 weeks  - Methocarbamol  500 mg tablet, every 6 hours as needed  - Acetaminophen  650 mg, every 8 hours as needed   - Restart your Eliquis  5 mg twice daily tomorrow, 8/23  FOLLOW UP APPOINTMENTS: Please follow-up with your primary care doctor at the internal medicine clinic. Please follow-up with your orthopedic surgeon, Dr. Kendal for wound care and repeat x-rays. Please follow-up with [facility]. Please follow-up with outpatient physical therapy and outpatient occupational therapy. Lastly, please follow-up with pulmonology for tracheostomy care.  Please call your PCP or our clinic if you have any questions or concerns, we may be able to help and keep you from a long and expensive emergency room wait. Our clinic and after hours phone number is [phone]. The best time to call is Monday through Friday 9 am to 4 pm but there is always someone available 24/7 if you have an emergency. If you need medication refills please notify your pharmacy one week in advance and they will send us  a request.   We are glad you are feeling better,  Viktoria King Internal Medicine Inpatient Teaching Service at [facility]   Discharge wound care:   Complete by: As directed    Increase activity slowly   Complete by: As directed        Signed: King Viktoria, DO 09/19/2023, 4:29 PM

## 2023-09-19 NOTE — Progress Notes (Signed)
 AVS reviewed with Lonni and mother who is present at the bedside, TOC medications picked up, home medications picked up from pharmacy, reasons to contact MD or call 911, medications and how to take reviewed, follow up appointments. [name] and mom verbalized understanding. Wheelchair to car where mom is taking home.

## 2023-09-19 NOTE — Progress Notes (Signed)
 Inpatient Rehab Admissions:  Inpatient Rehab Consult received.  I met with patient at the bedside for rehabilitation assessment and to discuss goals and expectations of an inpatient rehab admission.  Pt acknowledged understanding but would prefer to go home with [facility] than pursue CIR. TOC made aware.  Signed: Tinnie Yvone Cohens, MS, CCC-SLP Admissions Coordinator [phone]

## 2023-09-22 ENCOUNTER — Telehealth: Payer: Self-pay

## 2023-09-22 NOTE — Telephone Encounter (Signed)
 Pt mother called to confirm appts, no further needs at this time.

## 2023-09-23 ENCOUNTER — Other Ambulatory Visit: Payer: Self-pay | Admitting: Nurse Practitioner

## 2023-09-23 ENCOUNTER — Other Ambulatory Visit: Payer: Self-pay

## 2023-09-23 DIAGNOSIS — C329 Malignant neoplasm of larynx, unspecified: Secondary | ICD-10-CM

## 2023-09-23 DIAGNOSIS — Z515 Encounter for palliative care: Secondary | ICD-10-CM

## 2023-09-23 DIAGNOSIS — M62838 Other muscle spasm: Secondary | ICD-10-CM

## 2023-09-23 DIAGNOSIS — F419 Anxiety disorder, unspecified: Secondary | ICD-10-CM

## 2023-09-23 MED ORDER — DIAZEPAM 5 MG/5ML PO SOLN: 3.0000 mg | Freq: Three times a day (TID) | ORAL | 0 refills | Status: DC | PRN

## 2023-09-23 NOTE — Telephone Encounter (Signed)
 Pt mother called and reported that [name] spilled his pregabalin  on the bed and was requesting a refill. RN informed her that it is considered a controlled substance and the refill policy on those is stricter, pt mother verbalized understanding. She also requested a refill of diazepam . See associated orders.

## 2023-09-24 ENCOUNTER — Telehealth: Payer: Self-pay

## 2023-09-24 NOTE — Telephone Encounter (Signed)
 Pt mother called today asking for a nurse or home aid to go to chris' house to help him with medication post hip arthroplasty. Per [name], pt mother, orthopedics has set up /OT for the pt, RN instructed her to call back to orthopedics to clarify orders and see if they can attach any skilled nursing needs to that home health order. Number to orthopedics office provided, [name] verbalized understanding.

## 2023-09-24 NOTE — Progress Notes (Unsigned)
   Established Patient Office Visit  Subjective   Patient ID: David [name], male    DOB: December 04, 1975  Age: 48 y.o. MRN: [id number]  No chief complaint on file.   David [name] is a 48 y.o. who was previously hospitalized from 8/18 to 8/22 for a right hip fracture repaired with a right total hip arthroplasty. This morning, patient's mother presented to the appointment without the patient requesting to speak with provider without the patient present.  We explained to the mother that we cannot speak about the patient without him present. Patient has capacity and mother is not medical power of attorney.  Patient's mother requested the appointment be moved to a phone call appointment.  We attempted to call the patient at home (mother was present); however, the patient has a tracheostomy and was unable to speak directly with us  therefore we are unable to complete that appointment because we are unable to confirm patients DOB/identifiers.   We then tried to perform a video appointment; however, they were unable to access the link due to not having MyChart.  While on the phone, I expressed that we were unable to complete this appointment this time and that the patient will need to be at the office for additional appointments.  Front desk will set up IN PERSON appointment in the near future to address patient's HFU and additional concerns.    I discussed this appointment with Dr. Jeanelle.  Damien Lease, DO Internal Medicine Resident: PGY-2

## 2023-09-25 ENCOUNTER — Telehealth (INDEPENDENT_AMBULATORY_CARE_PROVIDER_SITE_OTHER): Payer: MEDICAID | Admitting: Student

## 2023-09-25 DIAGNOSIS — Z8781 Personal history of (healed) traumatic fracture: Secondary | ICD-10-CM | POA: Insufficient documentation

## 2023-09-25 NOTE — Progress Notes (Deleted)
  [facility] Internal Medicine Residency Telephone Encounter Continuity Care Appointment  HPI:  This telephone encounter was created for [name] on 09/25/2023 for the following purpose/cc ***.   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  As always, pt is advised that if symptoms worsen or new symptoms arise, they should go to an urgent care facility or to to ER for further evaluation.   Consent and Medical Decision Making:  Patient discussed with Dr. Francesco This is a telephone encounter between [name] and Damien Lease on 09/25/2023 for ***. The visit was conducted with the patient located at {NAMES:3044014::home} and Damien Lease at [facility]. The patient's identity was confirmed using their DOB and current address. The {WHO:3044014::patient,his/her legal guardian,***} has consented to being evaluated through a telephone encounter and understands the associated risks (an examination cannot be done and the patient may need to come in for an appointment) / benefits (allows the patient to remain at  home, decreasing exposure to coronavirus). I personally spent {Numbers; 0-31:32273} minutes on medical discussion.       Patient and mother present:

## 2023-09-25 NOTE — Assessment & Plan Note (Signed)
 Unable to complete HFU at this time, please refer to documentation on 8/28.

## 2023-09-25 NOTE — Progress Notes (Signed)
 Internal [facility] Clinic Attending  Case discussed with the resident at the time of the visit.  We reviewed the resident's history and exam and pertinent patient test results.  I agree with the assessment, diagnosis, and plan of care documented in the resident's note.

## 2023-09-30 NOTE — Therapy (Incomplete)
 OUTPATIENT PHYSICAL THERAPY LOWER EXTREMITY EVALUATION   Patient Name: David [name] MRN: [id number] DOB:1975-04-22, 48 y.o., male Today's Date: 09/30/2023  END OF SESSION:   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)    Pneumococcal bacteremia 06/09/2021   Pneumothorax    Left lung spontaneous pneumothorax at age 44 yr    S/P percutaneous endoscopic gastrostomy (PEG) tube placement (HCC) 06/24/2021   Past Surgical History:  Procedure Laterality Date   chest tubes Left    IR GASTROSTOMY TUBE MOD SED  06/18/2021   LAPAROSCOPIC INSERTION GASTROSTOMY TUBE N/A 06/20/2021   Procedure: LAPAROSCOPIC INSERTION GASTROSTOMY TUBE;  Surgeon: Signe Mitzie LABOR, MD;  Location: [facility] OR;  Service: General;  Laterality: N/A;  DOW PATIENT   TOTAL HIP ARTHROPLASTY Right 09/17/2023   Procedure: ARTHROPLASTY, HIP, TOTAL, ANTERIOR APPROACH;  Surgeon: Kendal Franky SQUIBB, MD;  Location: [facility] OR;  Service: Orthopedics;  Laterality: Right;   TRACHEOSTOMY TUBE PLACEMENT N/A 06/09/2021   Procedure: AWAKE TRACHEOSTOMY;  Surgeon: Carlie Clark, MD;  Location: [facility] OR;  Service: ENT;  Laterality: N/A;   Patient Active Problem List   Diagnosis Date Noted   S/P right hip fracture 09/25/2023   Tracheostomy in place [facility]) 09/17/2023   Severe protein-calorie malnutrition (HCC) 09/16/2023   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   Closed right hip fracture, initial encounter (HCC) 09/15/2023   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 04/03/2023   Difficulty urinating 03/27/2023   Subclinical hypothyroidism 02/10/2023   FTT (failure to thrive) in adult 12/31/2022   Dysphagia, unspecified 12/31/2022   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 12/31/2022   Hepatitis A immune 11/14/2022   End of life care 10/23/2022   Acute purulent bronchitis 10/23/2022   Hepatitis C 10/09/2022   Left leg pain 10/09/2022   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   History of pancytopenia 09/12/2022   Torticollis 07/04/2022   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   Defective dental restoration 12/31/2021   Chronic periodontitis 12/31/2021   Impacted third molar tooth 12/31/2021   Irritant contact dermatitis associated with digestive stoma 12/25/2021   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   Continuous tobacco abuse 11/21/2021   Dermatitis associated with moisture 10/16/2021   Back pain 08/16/2021   Candidal endocarditis    History of radiation to head and neck region 07/11/2021   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   Dysgeusia 07/11/2021   Pressure injury of skin 06/17/2021   Tracheostomy status (HCC)    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   Heart failure with reduced ejection fraction (HCC) 04/28/2021   Anxiety 04/28/2021   Elevated troponin 04/01/2021   Elevated transaminase level 04/01/2021   Weight loss, unintentional 01/15/2021   Protein-calorie malnutrition, severe (HCC) 01/10/2021   Hypotension 01/08/2021   Chemotherapy induced nausea and vomiting 01/01/2021   Encounter for dental examination 12/13/2020   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   Teeth missing 12/04/2020   Radiation caries 12/04/2020   Retained tooth root 12/04/2020   Chronic apical  periodontitis 12/04/2020   Accretions on teeth 12/04/2020    PCP: Kandis Perkins, DO  REFERRING PROVIDER: Kendal Drivers, MD  REFERRING DIAG: s/p Rt THA   THERAPY DIAG:  No diagnosis  found.  Rationale for Evaluation and Treatment: Rehabilitation  ONSET DATE: fall on 09/15/23, THA 09/17/23  SUBJECTIVE:   SUBJECTIVE STATEMENT: Pt presents to PT s/p Rt THA secondary to fracture sustained after fall.  Pt has complex medical history with laryngeal cancer, trach placement and mobility limitations due to ongoing treatment.    PERTINENT HISTORY: Heroin addition, laryngeal cancer with trach, pneumonia, pneumothorax . THA  PAIN: 10/01/23 Are you having pain? Yes: NPRS scale: *** Pain location: *** Pain description: *** Aggravating factors: *** Relieving factors: ***  PRECAUTIONS: Fall and Other: cancer  RED FLAGS: None   WEIGHT BEARING RESTRICTIONS: No  FALLS:  Has patient fallen in last 6 months? Yes. Number of falls PT will address balance in treatment   LIVING ENVIRONMENT: Lives with: {OPRC lives with:25569::lives with their family} Lives in: {Lives in:25570} Stairs: {opstairs:27293} Has following equipment at home: {Assistive devices:23999}  OCCUPATION: ***  PLOF: {PLOF:24004}  PATIENT GOALS: ***  NEXT MD VISIT: ***  OBJECTIVE:  Note: Objective measures were completed at Evaluation unless otherwise noted.  DIAGNOSTIC FINDINGS: ***  PATIENT SURVEYS:  {rehab surveys:24030}  COGNITION: Overall cognitive status: Within functional limits for tasks assessed     SENSATION: WFL    MUSCLE LENGTH: Hamstrings: Right *** deg; Left *** deg Debby test: Right *** deg; Left *** deg  POSTURE: {posture:25561}  PALPATION: ***  LOWER EXTREMITY ROM:  {AROM/PROM:27142} ROM Right eval Left eval  Hip flexion    Hip extension    Hip abduction    Hip adduction    Hip internal rotation    Hip external rotation    Knee flexion    Knee extension    Ankle dorsiflexion    Ankle plantarflexion    Ankle inversion    Ankle eversion     (Blank rows = not tested)  LOWER EXTREMITY MMT:  MMT Right eval Left eval  Hip flexion    Hip extension     Hip abduction    Hip adduction    Hip internal rotation    Hip external rotation    Knee flexion    Knee extension    Ankle dorsiflexion    Ankle plantarflexion    Ankle inversion    Ankle eversion     (Blank rows = not tested)  LOWER EXTREMITY SPECIAL TESTS:  {LEspecialtests:26242}  FUNCTIONAL TESTS:  {Functional tests:24029}  GAIT: Distance walked: *** Assistive device utilized: {Assistive devices:23999} Level of assistance: {Levels of assistance:24026} Comments: ***                                                                                                                                TREATMENT DATE:  10/01/23:  Findings from evaluation discussed, pt educated on plan of  care, HEP initiated.    If treatment provided at initial evaluation, no treatment charged due to lack of authorization.      PATIENT EDUCATION:  Education details: *** Person educated: Patient Education method: Programmer, multimedia, Facilities manager, and Handouts Education comprehension: verbalized understanding and returned demonstration  HOME EXERCISE PROGRAM: ***  ASSESSMENT:  CLINICAL IMPRESSION: Patient is a 48 y.o. male who was seen today for physical therapy evaluation and treatment s/p Rt hip fracture and THA.  Pt has been treated here in the past for strength and mobility associated with chronic illness.   OBJECTIVE IMPAIRMENTS: {opptimpairments:25111}.   ACTIVITY LIMITATIONS: {activitylimitations:27494}  PARTICIPATION LIMITATIONS: {participationrestrictions:25113}  PERSONAL FACTORS: {Personal factors:25162} are also affecting patient's functional outcome.   REHAB POTENTIAL: Good  CLINICAL DECISION MAKING: Evolving/moderate complexity  EVALUATION COMPLEXITY: Moderate   GOALS: Goals reviewed with patient? Yes  SHORT TERM GOALS: Target date: *** Be independent in initial HEP Baseline: Goal status: INITIAL  2.  *** Baseline:  Goal status: INITIAL  3.  *** Baseline:  Goal  status: INITIAL  4.  *** Baseline:  Goal status: INITIAL  5.  *** Baseline:  Goal status: INITIAL  6.  *** Baseline:  Goal status: INITIAL  LONG TERM GOALS: Target date: ***  Be independent in advanced HEP Baseline:  Goal status: INITIAL  2.  *** Baseline:  Goal status: INITIAL  3.  *** Baseline:  Goal status: INITIAL  4.  *** Baseline:  Goal status: INITIAL  5.  *** Baseline:  Goal status: INITIAL  6.  *** Baseline:  Goal status: INITIAL   PLAN:  PT FREQUENCY: 2x/week  PT DURATION: 8 weeks  PLANNED INTERVENTIONS: 97110-Therapeutic exercises, 97530- Therapeutic activity, 97112- Neuromuscular re-education, 97535- Self Care, 02859- Manual therapy, [id number]- Gait training, [phone]- Canalith repositioning, [id number]- Aquatic Therapy, H9716- Electrical stimulation (unattended), 20560 (1-2 muscles), 20561 (3+ muscles)- Dry Needling, Patient/Family education, Balance training, Stair training, Taping, Scar mobilization, Vestibular training, Cryotherapy, and Moist heat  PLAN FOR NEXT SESSION: PIERRETTE Burnard Joy, PT 09/30/23 12:17 PM   [facility] [address] [place], [place] [address] Phone # [phone] Fax [phone]

## 2023-10-01 ENCOUNTER — Ambulatory Visit: Payer: MEDICAID

## 2023-10-02 ENCOUNTER — Ambulatory Visit (HOSPITAL_COMMUNITY)
Admission: RE | Admit: 2023-10-02 | Discharge: 2023-10-02 | Disposition: A | Discharge status: Home / Self Care | Payer: MEDICAID | Source: Ambulatory Visit | Attending: Acute Care | Admitting: Acute Care

## 2023-10-02 DIAGNOSIS — Z930 Tracheostomy status: Secondary | ICD-10-CM | POA: Diagnosis present

## 2023-10-02 DIAGNOSIS — E43 Unspecified severe protein-calorie malnutrition: Secondary | ICD-10-CM

## 2023-10-02 DIAGNOSIS — J386 Stenosis of larynx: Secondary | ICD-10-CM

## 2023-10-02 DIAGNOSIS — R1310 Dysphagia, unspecified: Secondary | ICD-10-CM | POA: Insufficient documentation

## 2023-10-02 DIAGNOSIS — C329 Malignant neoplasm of larynx, unspecified: Secondary | ICD-10-CM | POA: Diagnosis present

## 2023-10-02 DIAGNOSIS — R634 Abnormal weight loss: Secondary | ICD-10-CM | POA: Diagnosis present

## 2023-10-02 DIAGNOSIS — C321 Malignant neoplasm of supraglottis: Secondary | ICD-10-CM | POA: Diagnosis present

## 2023-10-02 NOTE — Progress Notes (Signed)
 Tracheostomy Procedure Note  David [name] [id number] 1975/10/10  Pre Procedure Tracheostomy Information  Trach Brand:Bivona Portex  ref # [id number]  Size: 7.0  Style: Uncuffed Secured by: Velcro   Procedure: Trach Change and Trach Cleaning    Post Procedure Tracheostomy Information  Trach Brand: Portex Bivona  ref  [id number] Size: 7.0 Style: Uncuffed Secured by: Velcro   Post Procedure Evaluation:  ETCO2 positive color change from yellow to purple : Yes.   Vital signs:VSS Patients current condition: stable Complications: No apparent complications Trach site exam: clean, dry Wound care done: 4 x 4 gauze drain Patient did tolerate procedure well.   Education: none  Prescription needs: none    Additional needs: New PMV given to patient at this viist

## 2023-10-02 NOTE — Progress Notes (Signed)
 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 Has had slow but steady overall decline over the summer. Specifically significant adult FTT w/ECOG 3. He remains hospice appropriate in my opinion. Still very debilitated, malnourished, his neck contraction has not improved. Just released from hospital after getting ORIF of right hip. Discharged to home 8/25. Presents today for planned trach change.   He's kind of been his typical self. Refused inpt rehab, just finally got home PT and his mother tells be he refused first treatment. Says his pain is better. Walking w/ rolling walker.   From pulm standpoint he tells me he as had increased secretions from around trach and increased running nose. On further questioning this happens primarily when he is drinking clears. Also showed me his trach w/ discolored thick secretions. He told me he took his pills in chocolate pudding. He does not have worsening SOB, no fevers, no real changes otherwise. He continues to have fairly poor appetite   Review of Systems  Constitutional:  Positive for malaise/fatigue and weight loss. Negative for fever.  HENT:  Positive for congestion.        Runny nose and increased trach secretions when drinking thin fluids. Not so much noted w/ consistency of ensure. Did have clear chocolate pudding residual on trach when removed   Eyes: Negative.   Respiratory:  Positive for cough and hemoptysis.   Cardiovascular: Negative.   Gastrointestinal:  Negative for abdominal pain, nausea and vomiting.  Genitourinary: Negative.   Musculoskeletal: Negative.   All other systems reviewed and are negative.   Exam  General this is a 48 year old male he is well known to me and presents in no distress and seems to be at baseline chronic poor health  HENT his head is tilted towards his right shoulder. He has some dependent facial swelling. The trach stoma itself  continues to be distorted d/t his neck contraction. He is able to phonate w/ PMV. Trach stoma does not appear infected. He had residual chocolate pudding on trach  Pulm clear Card rrr Abd soft Ext warm   Procedure: the # 7 portex was removed. Modifications were made to the new trach to make it identical to one we removed. Then using clean technique the new # 7 portex was placed over obturator. Placement verified via ETCO2. Pt tolerated well. Again reminded that the current model is being used outside designed intent    Active Problems: Active Problems:   Tracheostomy status (HCC)   Subglottic stenosis   Laryngeal cancer (HCC)   Protein-calorie malnutrition, severe (HCC)   Weight loss, unintentional   Malignant neoplasm of supraglottis (HCC)   Dysphagia  Tracheostomy status (HCC) Overview: Brand: shiley Size: 7 Portex Bivona cuffless  Last change in clinic: today 9/4 prior was 3/6   Discussion Still has sig air trapping w/ capping attempt. He is not a candidate for decannulation.   He was switched  to 7 portex with # 4 inner shiley due to his marked neck contracture developing pressure ulcer on the left of stoma. He at home had modified the trach flange on the right sided so that it didn't rub on his neck, and modified the lumen length so that he can use an internal shiley # 4 disposable which he has in surplus so that he can clean the inner cannula and not have to have trach removed every time he plugs  which is a chronic problem for him. We discussed at length that modification of his trach would void any warrantee and does increase the risk of failure (I worry mostly about the integrity of the inner cannula) but he has tolerated this modification for over several month, is doing better with it, and he will continue to make this modification at home regardless if we modify it in the clinic. He understands this risk and accepts it.     Plan ROV 4 weeks Cont PMV as tolerated HME as  able Continue w/ his modified trach. Not really good alternatives given his neck contracture. I also think given he really is palliative in my opinion we should do all we can do to improve his QOL    Dysphagia, unspecified type Overview: Worse since his most recent hospitalization. Symptomatic w/ cough when he drinks clears. Says no problems w/ thicker fluid such as ensure shakes or milk shakes. Already eats a mechanically soft diet but witch chocolate on the trach I suspect he is silently aspirating too.  Plan His mother is going to try to thicken clears to ensure consistency  Have encouraged him to start using his neck pillow again to assist w/ keeping head a little more midline Encouraged him to chin tuck and swallow twice w/ fluids Also stay in upright position for at least 45 minutes after he eats Finally if symptoms persist he is going to need to see SLP again. My only concern about this is that I doubt he will be compliant with any significant consistency changes plus I think we need to be mindful of QOL at this point        My time 19 min    Maude FORBES Banner ACNP-BC [facility] Pulmonary/Critical Care Pager # [phone] OR # [phone] if no answer

## 2023-10-06 ENCOUNTER — Encounter: Payer: Self-pay | Admitting: Nurse Practitioner

## 2023-10-06 ENCOUNTER — Inpatient Hospital Stay: Payer: MEDICAID | Attending: Hematology and Oncology | Admitting: Nurse Practitioner

## 2023-10-06 VITALS — BP 104/76 | HR 77 | Temp 98.3°F | Resp 15

## 2023-10-06 DIAGNOSIS — G893 Neoplasm related pain (acute) (chronic): Secondary | ICD-10-CM | POA: Diagnosis not present

## 2023-10-06 DIAGNOSIS — C329 Malignant neoplasm of larynx, unspecified: Secondary | ICD-10-CM | POA: Diagnosis not present

## 2023-10-06 DIAGNOSIS — F419 Anxiety disorder, unspecified: Secondary | ICD-10-CM

## 2023-10-06 DIAGNOSIS — R630 Anorexia: Secondary | ICD-10-CM

## 2023-10-06 DIAGNOSIS — Z515 Encounter for palliative care: Secondary | ICD-10-CM

## 2023-10-06 DIAGNOSIS — R530 Neoplastic (malignant) related fatigue: Secondary | ICD-10-CM

## 2023-10-06 DIAGNOSIS — R634 Abnormal weight loss: Secondary | ICD-10-CM

## 2023-10-06 DIAGNOSIS — M792 Neuralgia and neuritis, unspecified: Secondary | ICD-10-CM

## 2023-10-06 DIAGNOSIS — M62838 Other muscle spasm: Secondary | ICD-10-CM

## 2023-10-06 MED ORDER — LIDOCAINE VISCOUS HCL 2 % MT SOLN: 15.0000 mL | Freq: Four times a day (QID) | OROMUCOSAL | 2 refills | Status: DC | PRN

## 2023-10-06 MED ORDER — DIAZEPAM 5 MG/5ML PO SOLN: 3.0000 mg | Freq: Three times a day (TID) | ORAL | 0 refills | Status: DC | PRN

## 2023-10-06 NOTE — Progress Notes (Signed)
 Palliative Medicine [facility]  [phone] [phone]) [phone]   Name: Trueman Worlds Date: 10/06/2023 MRN: [id number]  DOB: 02-14-1975  Patient Care Team: Kandis Perkins, DO as PCP - General (Internal Medicine) [name], Delon CROME, RN as Oncology Nurse Navigator [name], Gerard LABOR, DO as Consulting Physician (Otolaryngology) Izell Domino, MD as Consulting Physician (Radiation Oncology) Loretha Ash, MD as Consulting Physician (Hematology and Oncology) Pickenpack-Cousar, Fannie SAILOR, NP as Nurse Practitioner (Nurse Practitioner) Silvano Valrie SQUIBB, RN as Registered Nurse (Oncology)    INTERVAL HISTORY: Alanzo Lamb is a 48 y.o. male with multiple medical problems including stage IV squamous cell carcinoma, right supraglottic mass, adenopathy, s/p initial concurrent chemoradiation, history of drug use currently followed by methadone  clinic, homelessness, and some malnutrition. He was recently hospitalized for several months at [facility] where he received treatment for fungal endocarditis and required emergent tracheostomy and PEG tube. During hospitalization CT of chest showed concerns for residual vs recurrent tumor. Palliative requested to re-engage for ongoing support and goals of care.   SOCIAL HISTORY:     reports that he has been smoking cigarettes. He has never used smokeless tobacco. He reports that he does not currently use alcohol after a past usage of about 12.0 standard drinks of alcohol per week. He reports that he does not currently use drugs after having used the following drugs: IV.  ADVANCE DIRECTIVES:  None on file   CODE STATUS: Full code  PAST MEDICAL HISTORY: Past Medical History:  Diagnosis Date   Acute respiratory failure with hypoxia (HCC)    AKI (acute kidney injury) (HCC) 01/08/2021   Aspiration pneumonia of right lower lobe (HCC) 06/06/2021   Bacterial endocarditis    Chronic HFrEF (heart failure  with reduced ejection fraction) (HCC) 06/09/2021   Community acquired pneumonia due to Pneumococcus (HCC) 04/01/2021   Evaluation by medical service required 03/19/2022   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HCC)    Leaking PEG tube (HCC) 12/12/2021   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)    Pneumococcal bacteremia 06/09/2021   Pneumothorax    Left lung spontaneous pneumothorax at age 48 yr    S/P percutaneous endoscopic gastrostomy (PEG) tube placement (HCC) 06/24/2021    ALLERGIES:  has no known allergies.  MEDICATIONS:  Current Outpatient Medications  Medication Sig Dispense Refill   acetaminophen  (TYLENOL ) 325 MG tablet Take 2 tablets (650 mg total) by mouth 3 (three) times daily. 180 tablet 0   apixaban  (ELIQUIS ) 5 MG TABS tablet Take 1 tablet (5 mg total) by mouth 2 (two) times daily. 180 tablet 3   chlorhexidine  (HIBICLENS ) 4 % external liquid Apply 15 mLs (1 Application total) topically as directed for 30 doses. Use as directed daily for 5 days every other week for 6 weeks. 946 mL 1   diazePAM  5 MG/5ML SOLN Take 3 mLs (3 mg total) by mouth every 8 (eight) hours as needed. TAKE 3 MLS (3 MG TOTAL) BY MOUTH EVERY 8 (EIGHT) HOURS AS NEEDED FOR MUSCLE SPASMS (DYSPHAGIA). 120 mL 0   fluconazole  (DIFLUCAN ) 200 MG tablet Take 2 tablets (400 mg total) by mouth daily.     lidocaine  (XYLOCAINE ) 2 % solution Use as directed 15 mLs in the mouth or throat every 6 (six) hours as needed for mouth pain. Mix 1part 2% viscous lidocaine , 1part water . Swish and spit 30mL of total diluted mixture up to eight times a day, prn as needed for soreness 200 mL 2   methadone  (DOLOPHINE ) 10  MG/ML solution Take 130 mg by mouth every 8 (eight) hours.     methocarbamol  (ROBAXIN ) 500 MG tablet Take 1 tablet (500 mg total) by mouth every 6 (six) hours as needed for muscle spasms. 28 tablet 0   Misc. Devices (ROLLATOR ULTRA-LIGHT) MISC Use as needed 1 each 0   Multiple Vitamin (MULTIVITAMIN WITH MINERALS) TABS tablet  Take 1 tablet by mouth daily. 30 tablet 0   mupirocin  ointment (BACTROBAN ) 2 % Place 1 Application into the nose 2 (two) times daily for 60 doses. Use as directed 2 times daily for 5 days every other week for 6 weeks. 60 g 0   ondansetron  (ZOFRAN ) 4 MG tablet Take 1 tablet (4 mg total) by mouth every 8 (eight) hours as needed for nausea or vomiting. 30 tablet 1   Pregabalin  20 MG/ML SOLN Take 50 mg by mouth 3 (three) times daily. 160 mL 2   sertraline  (ZOLOFT ) 20 MG/ML concentrated solution MIX 1.25 MLS (25 MG TOTAL) WITH 1/2 CUP OF WATER  AND TAKE BY MOUTH DAILY. 60 mL 2   Vitamin D , Ergocalciferol , (DRISDOL ) 1.25 MG (50000 UNIT) CAPS capsule Take 1 capsule (50,000 Units total) by mouth every 7 (seven) days. 5 capsule 0   No current facility-administered medications for this visit.    VITAL SIGNS: BP 104/76 (BP Location: Right Arm, Patient Position: Sitting)   Pulse 77   Temp 98.3 F (36.8 C) (Temporal)   Resp 15   SpO2 100%  There were no vitals filed for this visit.   Estimated body mass index is 13.6 kg/m as calculated from the following:   Height as of 09/17/23: 5' 10 (1.778 m).   Weight as of 09/17/23: 94 lb 12.8 oz (43 kg).   PERFORMANCE STATUS (ECOG) : 1 - Symptomatic but completely ambulatory  Physical Exam General: NAD Cardiovascular: regular rate and rhythm Pulmonary: normal breathing pattern, trach in place, Passy-Muir valve Extremities: Neck contracture Skin: no rashes Neurological: AAO x3  IMPRESSION: Discussed the use of AI scribe software for clinical note transcription with the patient, who gave verbal consent to proceed.  History of Present Illness David [name] is a 48 year old male with a history of tracheostomy and stage IV squamous cell carcinoma presents to clinic for symptom management follow-up.   He is accompanied by his mother.  Patient is doing well overall.    Patient suffered a fall on 8/18 resulting in displaced right femoral neck  and heal fracture s/p right total hip on 8/20. Per patient and his mother he has declined PT despite recommendations. Barefoot states he is doing well, pain has improved, and he is able to stand and take some steps without difficulty.   He is experiencing soreness in his gums and is scheduled for gum surgery on September 23rd. He has already obtained dentures in preparation for this procedure. Using lidocaine  for oral pain.   Appetite is fair. Some days are better than others. Weight is is down to 83lbs due to recent fall and inability to move around much he states this affected his appetite in addition to mouth soreness. He is hopeful this will improve once procedure is over and he is able to utilize his dentures.   He is taking diazepam  for anxiety and pregabalin . Regimen effective.  He has been using Advil  PM for sleep, taking one crushed tablet as needed. He has tried melatonin in the past, but it has not been effective. Expresses some difficulty with insomnia. Recommended additional trial  of melatonin extra strength. No adjustments to medications at this time.   No new symptoms of concern.  We will continue to closely follow and support as needed.  I discussed the importance of continued conversation with family and their medical providers regarding overall plan of care and treatment options, ensuring decisions are within the context of the patients values and GOCs. Assessment & Plan Palliative care for malignant neoplasm of larynx Malignant neoplasm of the larynx with no new symptoms. Cleared for gum surgery on September 23rd per Pulmonary. Dentures on hand in preparation for procedure.   Muscle spasm Muscle spasms and pain managed with pregabalin .  - Continue pregabalin  as prescribed.  Anxiety disorder Anxiety disorder managed with diazepam , refill due. - Refill diazepam  prescription.  Follow-up Follow-up appointment scheduled to monitor progress and adjust treatment as necessary. -  Schedule follow-up appointment  in 6 weeks. Sooner if needed.   Patient expressed understanding and was in agreement with this plan. He also understands that He can call the clinic at any time with any questions, concerns, or complaints.   Any controlled substances utilized were prescribed in the context of palliative care. PDMP has been reviewed.   Visit consisted of counseling and education dealing with the complex and emotionally intense issues of symptom management and palliative care in the setting of serious and potentially life-threatening illness.  Levon Borer, AGPCNP-BC  Palliative Medicine Team/[facility]

## 2023-10-08 ENCOUNTER — Ambulatory Visit: Payer: MEDICAID | Admitting: Physical Therapy

## 2023-10-10 ENCOUNTER — Ambulatory Visit: Payer: MEDICAID | Admitting: Student

## 2023-10-10 ENCOUNTER — Ambulatory Visit: Payer: Self-pay | Admitting: Student

## 2023-10-10 VITALS — BP 120/82 | HR 76 | Temp 97.9°F | Ht 70.0 in | Wt 83.4 lb

## 2023-10-10 DIAGNOSIS — D649 Anemia, unspecified: Secondary | ICD-10-CM

## 2023-10-10 DIAGNOSIS — Z8781 Personal history of (healed) traumatic fracture: Secondary | ICD-10-CM

## 2023-10-10 DIAGNOSIS — Z96641 Presence of right artificial hip joint: Secondary | ICD-10-CM | POA: Diagnosis not present

## 2023-10-10 DIAGNOSIS — M436 Torticollis: Secondary | ICD-10-CM

## 2023-10-10 DIAGNOSIS — S72001D Fracture of unspecified part of neck of right femur, subsequent encounter for closed fracture with routine healing: Secondary | ICD-10-CM

## 2023-10-10 LAB — CBC
Hematocrit: 29.7 % — ABNORMAL LOW (ref 37.5–51.0)
Hemoglobin: 9.1 g/dL — ABNORMAL LOW (ref 13.0–17.7)
MCH: 26.7 pg (ref 26.6–33.0)
MCHC: 30.6 g/dL — ABNORMAL LOW (ref 31.5–35.7)
MCV: 87 fL (ref 79–97)
Platelets: 203 x10E3/uL (ref 150–450)
RBC: 3.41 x10E6/uL — ABNORMAL LOW (ref 4.14–5.80)
RDW: 17 % — ABNORMAL HIGH (ref 11.6–15.4)
WBC: 5.1 x10E3/uL (ref 3.4–10.8)

## 2023-10-10 NOTE — Patient Instructions (Signed)
 Thank you, Mr.Kinser Grimme for allowing us  to provide your care today. Today we discussed anemia, right hip fracture, and right torticollis.    I have ordered the following labs for you:   Lab Orders         CBC no Diff         Iron, TIBC and Ferritin Panel       Referrals ordered today:    Referral Orders         Ambulatory referral to Physical Therapy      I have ordered the following medication/changed the following medications:   Stop the following medications: Medications Discontinued During This Encounter  Medication Reason   ondansetron  (ZOFRAN ) 4 MG tablet    mupirocin  ointment (BACTROBAN ) 2 %      Start the following medications: No orders of the defined types were placed in this encounter.    Follow up: 3 months     Should you have any questions or concerns please call the internal medicine clinic at [phone].     Please note that our late policy has changed.  If you are more than 15 minutes late to your appointment, you may be asked to reschedule your appointment.  Dr. Kandis, D.O. [facility]

## 2023-10-10 NOTE — Progress Notes (Unsigned)
 Established Patient Office Visit  Subjective   Patient ID: David [name], male    DOB: [birth year]  Age: 48 y.o. MRN: [id number]  No chief complaint on file.   Steele Stracener is a 48 y.o. who presents to the clinic for a hospital follow up.  He was previously hospitalized from 8/18 to 8/22 for a right hip fracture repaired with a right total hip arthroplasty. Since his hospitalization he has follow-up with orthopedics and refused physical therapy.  He reports that his pain is well-controlled and he is able to get around with a walker.  He continues to decline physical therapy.  During the hospitalization, he was noted to have worsening normocytic anemia with a Hgb of 7.5 on discharge. He denies any sources of bleeding today.   Medication reconciliation was performed  Please see problem based assessment and plan for additional details.    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 04/03/2023   Difficulty urinating 03/27/2023   Subclinical hypothyroidism 02/10/2023   FTT (failure to thrive) in adult 12/31/2022   Dysphagia 12/31/2022   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 12/31/2022   Hepatitis A immune 11/14/2022   End of life care 10/23/2022   Acute purulent bronchitis 10/23/2022   Hepatitis C 10/09/2022   Left leg pain 10/09/2022   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   History of pancytopenia 09/12/2022   Torticollis 07/04/2022   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   Defective dental restoration 12/31/2021   Chronic periodontitis 12/31/2021   Impacted third molar tooth 12/31/2021    Irritant contact dermatitis associated with digestive stoma 12/25/2021   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   Continuous tobacco abuse 11/21/2021   Dermatitis associated with moisture 10/16/2021   Back pain 08/16/2021   Candidal endocarditis    History of radiation to head and neck region 07/11/2021   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   Dysgeusia 07/11/2021   Pressure injury of skin 06/17/2021   Tracheostomy status (HCC)    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   Heart failure with reduced ejection fraction (HCC) 04/28/2021   Anxiety 04/28/2021   Elevated troponin 04/01/2021   Elevated transaminase level 04/01/2021   Weight loss, unintentional 01/15/2021   Protein-calorie malnutrition, severe (HCC) 01/10/2021   Hypotension 01/08/2021   Chemotherapy induced nausea and vomiting 01/01/2021   Encounter for dental examination 12/13/2020   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   Teeth missing 12/04/2020   Radiation caries 12/04/2020   Retained tooth root 12/04/2020   Chronic apical periodontitis 12/04/2020   Accretions on teeth 12/04/2020      Objective:     BP 120/82 (BP Location: Left Arm, Patient Position: Sitting, Cuff Size: Small)   Pulse 76   Temp 97.9 F (36.6 C) (Oral)   Ht 5' 10 (1.778 m)   Wt 83 lb 6.4 oz (37.8 kg)   BMI 11.97 kg/m  BP Readings from Last 3 Encounters:  10/11/23 107/82  10/10/23 120/82  10/06/23 104/76   Wt Readings from Last 3 Encounters:  10/10/23 83 lb 6.4 oz (37.8 kg)  09/17/23 94 lb 12.8 oz (43 kg)  08/19/23 92 lb 11.2 oz (42 kg)  Physical Exam Vitals reviewed.  Constitutional:      Appearance: He is cachectic. He is not toxic-appearing or diaphoretic.     Comments: Chronically ill appearing Mother present in room  Right torticollis   Cardiovascular:     Rate and Rhythm: Normal rate and regular rhythm.     Heart sounds: No murmur heard. Pulmonary:     Effort: Pulmonary effort is normal. No respiratory distress.     Breath  sounds: Normal breath sounds. No wheezing.  Musculoskeletal:     Right lower leg: No edema.     Left lower leg: No edema.  Skin:    General: Skin is warm and dry.  Neurological:     Mental Status: He is alert.     Gait: Gait is intact.  Psychiatric:        Mood and Affect: Mood and affect normal.      Last metabolic panel Lab Results  Component Value Date   GLUCOSE 87 10/11/2023   NA 136 10/11/2023   K 4.1 10/11/2023   CL 97 (L) 10/11/2023   CO2 27 10/11/2023   BUN 9 10/11/2023   CREATININE 0.50 (L) 10/11/2023   GFRNONAA >60 10/11/2023   CALCIUM 8.5 (L) 10/11/2023   PHOS 2.7 09/16/2023   PROT 7.1 11/29/2022   ALBUMIN  3.1 (L) 11/29/2022   LABGLOB 3.4 11/13/2022   BILITOT 0.3 11/29/2022   ALKPHOS 67 11/29/2022   AST 31 11/29/2022   ALT 17 11/29/2022   ANIONGAP 9 10/11/2023   Last lipids Lab Results  Component Value Date   CHOL 98 04/01/2021   HDL 35 (L) 04/01/2021   LDLCALC 55 04/01/2021   TRIG 40 04/01/2021   CHOLHDL 2.8 04/01/2021   Last hemoglobin A1c No results found for: HGBA1C    The ASCVD Risk score (Arnett DK, et al., 2019) failed to calculate for the following reasons:   Risk score cannot be calculated because patient has a medical history suggesting prior/existing ASCVD    Assessment & Plan:   Problem List Items Addressed This Visit       Musculoskeletal and Integument   Torticollis   Patient has severe R torticollis on exam today. In the past, he underwent PT with improvement of the right torticollis.   I suspect that patient's failure to thrive is also due to the R torticollis as he endorses difficulty eating/drinking due to the torticollis.   Plan: -PT referral sent for R torticollis       Relevant Orders   Ambulatory referral to Physical Therapy   Closed right hip fracture [facility])   He was previously hospitalized from 8/18 to 8/22 for a right hip fracture repaired with a right total hip arthroplasty. Since his hospitalization he has  follow-up with orthopedics and refused physical therapy.  He reports that his pain is well-controlled and he is able to get around with a walker.  He continues to decline physical therapy.  Plan: -Continue to follow with orthopedic surgery  -PT referral placed if he changes his mind, I strongly recommended PT for adequate rehabilitation       Relevant Orders   Ambulatory referral to Physical Therapy     Other   S/P right hip fracture   Relevant Orders   Ambulatory referral to Physical Therapy   Normocytic Anemia - Primary   Patient has a history of normocytic anemia. During hospitalization, his Hbg down trended to 7.1 and was 7.5 on the day of discharge.   Patient denies  source of bleeding.   I suspect that his source of anemia is multifactorial: primarily from poor nutrition, chronic disease and inflammation. Fortunately patient is under surveillance for stage IV laryngeal cancer and is not receiving chemotherapy at this time (as anemia is associated with pancytopenia).   Plan: -CBC -Iron and ferritin studies ordered -If iron deficiency is present, patient will likely need IV iron infusion given his difficulty maintaining oral intake with the severe R torticollis        Relevant Orders   CBC no Diff (Completed)   Iron, TIBC and Ferritin Panel (Completed)    Return in about 3 months (around 01/09/2024) for Chronic conditions .    Damien Lease, DO

## 2023-10-11 ENCOUNTER — Emergency Department (HOSPITAL_COMMUNITY): Payer: MEDICAID

## 2023-10-11 ENCOUNTER — Other Ambulatory Visit: Payer: Self-pay

## 2023-10-11 ENCOUNTER — Emergency Department (HOSPITAL_COMMUNITY)
Admission: EM | Admit: 2023-10-11 | Discharge: 2023-10-11 | Disposition: A | Discharge status: Home / Self Care | Payer: MEDICAID | Attending: Emergency Medicine | Admitting: Emergency Medicine

## 2023-10-11 DIAGNOSIS — D649 Anemia, unspecified: Secondary | ICD-10-CM | POA: Insufficient documentation

## 2023-10-11 DIAGNOSIS — R0602 Shortness of breath: Secondary | ICD-10-CM | POA: Insufficient documentation

## 2023-10-11 DIAGNOSIS — Z8521 Personal history of malignant neoplasm of larynx: Secondary | ICD-10-CM | POA: Diagnosis not present

## 2023-10-11 DIAGNOSIS — Z7901 Long term (current) use of anticoagulants: Secondary | ICD-10-CM | POA: Diagnosis not present

## 2023-10-11 DIAGNOSIS — Z86718 Personal history of other venous thrombosis and embolism: Secondary | ICD-10-CM | POA: Diagnosis not present

## 2023-10-11 DIAGNOSIS — R059 Cough, unspecified: Secondary | ICD-10-CM | POA: Insufficient documentation

## 2023-10-11 LAB — I-STAT VENOUS BLOOD GAS, ED
Acid-Base Excess: 4 mmol/L — ABNORMAL HIGH (ref 0.0–2.0)
Bicarbonate: 28.9 mmol/L — ABNORMAL HIGH (ref 20.0–28.0)
Calcium, Ion: 1.11 mmol/L — ABNORMAL LOW (ref 1.15–1.40)
HCT: 26 % — ABNORMAL LOW (ref 39.0–52.0)
Hemoglobin: 8.8 g/dL — ABNORMAL LOW (ref 13.0–17.0)
O2 Saturation: 82 %
Potassium: 4.1 mmol/L (ref 3.5–5.1)
Sodium: 136 mmol/L (ref 135–145)
TCO2: 30 mmol/L (ref 22–32)
pCO2, Ven: 46.6 mmHg (ref 44–60)
pH, Ven: 7.4 (ref 7.25–7.43)
pO2, Ven: 47 mmHg — ABNORMAL HIGH (ref 32–45)

## 2023-10-11 LAB — BASIC METABOLIC PANEL WITH GFR
Anion gap: 9 (ref 5–15)
BUN: 10 mg/dL (ref 6–20)
CO2: 27 mmol/L (ref 22–32)
Calcium: 8.5 mg/dL — ABNORMAL LOW (ref 8.9–10.3)
Chloride: 96 mmol/L — ABNORMAL LOW (ref 98–111)
Creatinine, Ser: 0.52 mg/dL — ABNORMAL LOW (ref 0.61–1.24)
GFR, Estimated: >60 mL/min (ref >60)
Glucose, Bld: 89 mg/dL (ref 70–99)
Potassium: 4 mmol/L (ref 3.5–5.1)
Sodium: 132 mmol/L — ABNORMAL LOW (ref 135–145)

## 2023-10-11 LAB — I-STAT CHEM 8, ED
BUN: 9 mg/dL (ref 6–20)
Calcium, Ion: 1.11 mmol/L — ABNORMAL LOW (ref 1.15–1.40)
Chloride: 97 mmol/L — ABNORMAL LOW (ref 98–111)
Creatinine, Ser: 0.5 mg/dL — ABNORMAL LOW (ref 0.61–1.24)
Glucose, Bld: 87 mg/dL (ref 70–99)
HCT: 26 % — ABNORMAL LOW (ref 39.0–52.0)
Hemoglobin: 8.8 g/dL — ABNORMAL LOW (ref 13.0–17.0)
Potassium: 4.1 mmol/L (ref 3.5–5.1)
Sodium: 136 mmol/L (ref 135–145)
TCO2: 26 mmol/L (ref 22–32)

## 2023-10-11 LAB — IRON,TIBC AND FERRITIN PANEL
Ferritin: 221 ng/mL (ref 30–400)
Iron Saturation: 11 % — ABNORMAL LOW (ref 15–55)
Iron: 25 ug/dL — ABNORMAL LOW (ref 38–169)
Total Iron Binding Capacity: 227 ug/dL — ABNORMAL LOW (ref 250–450)
UIBC: 202 ug/dL (ref 111–343)

## 2023-10-11 LAB — CBC
HCT: 26.4 % — ABNORMAL LOW (ref 39.0–52.0)
Hemoglobin: 8.1 g/dL — ABNORMAL LOW (ref 13.0–17.0)
MCH: 27.2 pg (ref 26.0–34.0)
MCHC: 30.7 g/dL (ref 30.0–36.0)
MCV: 88.6 fL (ref 80.0–100.0)
Platelets: 174 K/uL (ref 150–400)
RBC: 2.98 MIL/uL — ABNORMAL LOW (ref 4.22–5.81)
RDW: 17.1 % — ABNORMAL HIGH (ref 11.5–15.5)
WBC: 4.8 K/uL (ref 4.0–10.5)
nRBC: 0 % (ref 0.0–0.2)

## 2023-10-11 LAB — RESP PANEL BY RT-PCR (RSV, FLU A&B, COVID)  RVPGX2
Influenza A by PCR: NEGATIVE
Influenza B by PCR: NEGATIVE
Resp Syncytial Virus by PCR: NEGATIVE
SARS Coronavirus 2 by RT PCR: NEGATIVE

## 2023-10-11 LAB — I-STAT CG4 LACTIC ACID, ED: Lactic Acid, Venous: 1.1 mmol/L (ref 0.5–1.9)

## 2023-10-11 MED ORDER — SODIUM CHLORIDE 3 % IN NEBU: INHALATION_SOLUTION | RESPIRATORY_TRACT | 12 refills | Status: DC | PRN

## 2023-10-11 MED ORDER — GUAIFENESIN ER 600 MG PO TB12: 600.0000 mg | ORAL_TABLET | Freq: Two times a day (BID) | ORAL | 0 refills | Status: AC

## 2023-10-11 NOTE — ED Triage Notes (Signed)
 Pt BIB [facility] from home d/t sob since last night. Pt has a trach. It wakes him up every 2 hrs. Initla O2 was 90% on RA. Medic placed him on NRB, been 100% w them. VSS. A&O X4.

## 2023-10-11 NOTE — Assessment & Plan Note (Signed)
 Patient has a history of normocytic anemia. During hospitalization, his Hbg down trended to 7.1 and was 7.5 on the day of discharge.   Patient denies source of bleeding.   I suspect that his source of anemia is multifactorial: primarily from poor nutrition, chronic disease and inflammation. Fortunately patient is under surveillance for stage IV laryngeal cancer and is not receiving chemotherapy at this time (as anemia is associated with pancytopenia).   Plan: -CBC -Iron and ferritin studies ordered -If iron deficiency is present, patient will likely need IV iron infusion given his difficulty maintaining oral intake with the severe R torticollis

## 2023-10-11 NOTE — Assessment & Plan Note (Signed)
 Patient has severe R torticollis on exam today. In the past, he underwent PT with improvement of the right torticollis.   I suspect that patient's failure to thrive is also due to the R torticollis as he endorses difficulty eating/drinking due to the torticollis.   Plan: -PT referral sent for R torticollis

## 2023-10-11 NOTE — Discharge Instructions (Addendum)
 Encounter today was reassuring.  As we discussed suspect that your increased secretion burden over the last week has contributed to your trouble breathing.  I sent Mucinex  and nebulized saline as well as ordered nebulizer machine.  Please follow-up with internal medicine.  If your symptoms worsen, develop a fever or chest pain or any other concerning symptom please return to the ED for further evaluation.

## 2023-10-11 NOTE — ED Provider Notes (Signed)
 [place] EMERGENCY DEPARTMENT AT [facility] Provider Note   CSN: [id number] Arrival date & time: 10/11/23  1004     Patient presents with: Shortness of Breath  HPI David [name] is a 47 y.o. 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  He states that started acutely this morning.  He reports in the last week he has had a productive cough with more white-colored phlegm than usual.  He states that whenever he coughs his shortness of breath does improve.  Denies chest pain. Did call EMS and they were able to suction him somewhat on arrival and he stated that helped as well. Reports that he is taking his eliquis  as prescribed.    Shortness of Breath      Prior to Admission medications   Medication Sig Start Date End Date Taking? Authorizing Provider  guaiFENesin  (MUCINEX ) 600 MG 12 hr tablet Take 1 tablet (600 mg total) by mouth 2 (two) times daily for 7 days. 10/11/23 10/18/23 Yes Lang Norleen POUR, PA-C  sodium chloride  HYPERTONIC 3 % nebulizer solution Take by nebulization as needed for other. 10/11/23  Yes Jora Galluzzo K, PA-C  acetaminophen  (TYLENOL ) 325 MG tablet Take 2 tablets (650 mg total) by mouth 3 (three) times daily. 09/19/23   Charmayne Holmes, DO  apixaban  (ELIQUIS ) 5 MG TABS tablet Take 1 tablet (5 mg total) by mouth 2 (two) times daily. 09/20/23   Charmayne Holmes, DO  chlorhexidine  (HIBICLENS ) 4 % external liquid Apply 15 mLs (1 Application total) topically as directed for 30 doses. Use as directed daily for 5 days every other week for 6 weeks. 09/17/23   Danton Lauraine LABOR, PA-C  diazePAM  5 MG/5ML SOLN Take 3 mLs (3 mg total) by mouth every 8 (eight) hours as needed. TAKE 3 MLS (3 MG TOTAL) BY MOUTH EVERY 8 (EIGHT) HOURS AS NEEDED FOR MUSCLE SPASMS (DYSPHAGIA). 10/06/23   Pickenpack-Cousar, Athena N, NP  fluconazole  (DIFLUCAN ) 200 MG tablet Take 2 tablets (400 mg  total) by mouth daily. 09/20/23   Charmayne Holmes, DO  lidocaine  (XYLOCAINE ) 2 % solution Use as directed 15 mLs in the mouth or throat every 6 (six) hours as needed for mouth pain. Mix 1part 2% viscous lidocaine , 1part water . Swish and spit 30mL of total diluted mixture up to eight times a day, prn as needed for soreness 10/06/23   Pickenpack-Cousar, Fannie SAILOR, NP  methadone  (DOLOPHINE ) 10 MG/ML solution Take 130 mg by mouth every 8 (eight) hours.    [provider]  methocarbamol  (ROBAXIN ) 500 MG tablet Take 1 tablet (500 mg total) by mouth every 6 (six) hours as needed for muscle spasms. 09/19/23   Lovie Clarity, MD  Misc. Devices (ROLLATOR ULTRA-LIGHT) MISC Use as needed 09/19/23   Charmayne Holmes, DO  Multiple Vitamin (MULTIVITAMIN WITH MINERALS) TABS tablet Take 1 tablet by mouth daily. 09/20/23   Charmayne Holmes, DO  Pregabalin  20 MG/ML SOLN Take 50 mg by mouth 3 (three) times daily. 08/19/23   Pickenpack-Cousar, Athena N, NP  sertraline  (ZOLOFT ) 20 MG/ML concentrated solution MIX 1.25 MLS (25 MG TOTAL) WITH 1/2 CUP OF WATER  AND TAKE BY MOUTH DAILY. 07/21/23   Kandis Perkins, DO  Vitamin D , Ergocalciferol , (DRISDOL ) 1.25 MG (50000 UNIT) CAPS capsule Take 1 capsule (50,000 Units total) by mouth every 7 (seven) days. 09/23/23   Lovie Clarity, MD    Allergies: Patient has no known allergies.  Review of Systems  Respiratory:  Positive for shortness of breath.     Updated Vital Signs BP 110/72   Pulse 70   Temp 98.9 F (37.2 C) (Oral)   Resp 20   SpO2 100%   Physical Exam Vitals and nursing note reviewed.  HENT:     Head: Normocephalic and atraumatic.     Mouth/Throat:     Mouth: Mucous membranes are moist.  Eyes:     General:        Right eye: No discharge.        Left eye: No discharge.     Conjunctiva/sclera: Conjunctivae normal.  Cardiovascular:     Rate and Rhythm: Normal rate and regular rhythm.     Pulses: Normal pulses.     Heart sounds: Normal heart sounds.  Pulmonary:      Effort: Pulmonary effort is normal.     Breath sounds: Normal breath sounds and air entry. No decreased breath sounds, wheezing, rhonchi or rales.     Comments: Trach collar in place on Room air Abdominal:     General: Abdomen is flat.     Palpations: Abdomen is soft.  Skin:    General: Skin is warm and dry.  Neurological:     General: No focal deficit present.  Psychiatric:        Mood and Affect: Mood normal.     (all labs ordered are listed, but only abnormal results are displayed) Labs Reviewed  BASIC METABOLIC PANEL WITH GFR - Abnormal; Notable for the following components:      Result Value   Sodium 132 (*)    Chloride 96 (*)    Creatinine, Ser 0.52 (*)    Calcium 8.5 (*)    All other components within normal limits  CBC - Abnormal; Notable for the following components:   RBC 2.98 (*)    Hemoglobin 8.1 (*)    HCT 26.4 (*)    RDW 17.1 (*)    All other components within normal limits  I-STAT CHEM 8, ED - Abnormal; Notable for the following components:   Chloride 97 (*)    Creatinine, Ser 0.50 (*)    Calcium, Ion 1.11 (*)    Hemoglobin 8.8 (*)    HCT 26.0 (*)    All other components within normal limits  I-STAT VENOUS BLOOD GAS, ED - Abnormal; Notable for the following components:   pO2, Ven 47 (*)    Bicarbonate 28.9 (*)    Acid-Base Excess 4.0 (*)    Calcium, Ion 1.11 (*)    HCT 26.0 (*)    Hemoglobin 8.8 (*)    All other components within normal limits  RESP PANEL BY RT-PCR (RSV, FLU A&B, COVID)  RVPGX2  I-STAT CG4 LACTIC ACID, ED    EKG: EKG Interpretation Date/Time:  Saturday October 11 2023 10:14:59 EDT Ventricular Rate:  71 PR Interval:  140 QRS Duration:  96 QT Interval:  459 QTC Calculation: 499 R Axis:   60  Text Interpretation: Sinus rhythm Borderline T wave abnormalities Confirmed by Cottie Cough [phone]) on 10/11/2023 12:17:21 PM  Radiology: CT Chest Wo Contrast Result Date: 10/11/2023 EXAM: CT CHEST WITHOUT CONTRAST 10/11/2023  12:19:38 PM TECHNIQUE: CT of the chest was performed without the administration of intravenous contrast. Multiplanar reformatted images are provided for review. Automated exposure control, iterative reconstruction, and/or weight based adjustment of the mA/kV was utilized to reduce the radiation dose to as low as reasonably achievable. COMPARISON: 1 view chest x-ray  10/11/2023. CT of the chest 08/18/2021. CLINICAL HISTORY: Pneumonia, complication suspected, xray done. FINDINGS: MEDIASTINUM: Atherosclerotic calcifications are present at the aortic arch and great vessel origins without focal stenosis or aneurysm. The central airways are clear. Tracheostomy tube is in place. LYMPH NODES: No mediastinal, hilar or axillary lymphadenopathy. LUNGS AND PLEURA: Chronic reticulonodular airspace opacities are present posteriorly in the right middle lobe and lower lobe upper lobe. No new superimposed airspace disease is present. No significant pleural effusion or pneumothorax is present. Scarring is present at the lung apices. SOFT TISSUES/BONES: A sternal fracture demonstrates irregular and sclerotic margins consistent with a chronic unhealed fracture. Mildly prominent soft tissue is present. Chronic osteomyelitis is suggested. No definite active disease is present. UPPER ABDOMEN: Limited images of the upper abdomen demonstrates no acute abnormality. IMPRESSION: 1. No acute findings. 2. Chronic reticulonodular airspace opacities in the right middle lobe and lower lobe upper lobe, without new superimposed airspace disease. 3. Chronic unhealed sternal fracture with mildly prominent soft tissue and suggesting chronic osteomyelitis. No definite active disease. Electronically signed by: Lonni Necessary MD 10/11/2023 01:01 PM EDT RP Workstation: [id number]   DG Chest Port 1 View Result Date: 10/11/2023 EXAM: 1 VIEW XRAY OF THE CHEST 10/11/2023 10:50:00 AM COMPARISON: 09/15/2023 CLINICAL HISTORY: SOB (shortness of breath)  [id number]. Reason for exam: SOB; Best images obtainable due to pt. Condition. FINDINGS: LINES, TUBES AND DEVICES: Tracheostomy tube stable in place. LUNGS AND PLEURA: Increased reticular opacities in right lung. This is likely exaggerated by patient positioning. Edema or superimposed airspace disease is considered. Left apical postsurgical changes. HEART AND MEDIASTINUM: No acute abnormality of the cardiac and mediastinal silhouettes. BONES AND SOFT TISSUES: No acute osseous abnormality. IMPRESSION: 1. Increased reticular opacities in the right lung, possibly exaggerated by patient positioning. Edema or superimposed airspace disease is considered. 2. Left apical postsurgical changes. 3. Tracheostomy tube stable in place. Electronically signed by: Lonni Necessary MD 10/11/2023 11:30 AM EDT RP Workstation: [id number]     Procedures   Medications Ordered in the ED - No data to display                                  Medical Decision Making Amount and/or Complexity of Data Reviewed Labs: ordered. Radiology: ordered.  Risk OTC drugs. Prescription drug management.   Initial Impression and Ddx 48 year old well-appearing trach collar dependent male presenting for shortness of breath.  Exam was unremarkable.  DDx includes pneumonia, PE, pneumothorax, upper airway obstruction secondary to increased secretion burden or mass effect from his cancer, other. 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  Details are listed above.  Shared those findings with patient  and his mother.  -I personally reviewed and interpreted EKG which revealed sinus rhythm  Patient Reassessment and Ultimate Disposition/Management Deep suction with respiratory therapy with further improvement. Suspect that his acute breathing issues this morning could be related to increased secretion burden.  Especially since he coughed and then later had suction by EMS and symptoms improved.  He has been stable for a few hours now  on room air and exhibiting no signs of respiratory distress at this time.  Workup does not suggest infection that could be contributing.   He does have close follow-up with internal medicine.  Will send Mucinex , nebulized saline and to order a nebulizer as he states that he gave his nebulizer away.  Advised him to follow-up with internal medicine.  Also consider PE but unlikely given that he is taking his Eliquis , no pleuritic chest pain and vitals have been reassuring.  Discussed return precautions with he and his mother.  He was agreeable.  Discharged in good condition.  Patient management required discussion with the following services or consulting groups:  None  Complexity of Problems Addressed Acute complicated illness or Injury  Additional Data Reviewed and Analyzed Further history obtained from: Past medical history and medications listed in the EMR and Prior ED visit notes  Patient Encounter Risk Assessment Consideration of hospitalization      Final diagnoses:  Shortness of breath    ED Discharge Orders          Ordered    guaiFENesin  (MUCINEX ) 600 MG 12 hr tablet  2 times daily        10/11/23 1331    sodium chloride  HYPERTONIC 3 % nebulizer solution  As needed        10/11/23 1331    For home use only DME Nebulizer machine        10/11/23 1331               Lang Norleen POUR, PA-C 10/11/23 1354    Cottie Donnice PARAS, MD 10/11/23 1441

## 2023-10-11 NOTE — Assessment & Plan Note (Signed)
 He was previously hospitalized from 8/18 to 8/22 for a right hip fracture repaired with a right total hip arthroplasty. Since his hospitalization he has follow-up with orthopedics and refused physical therapy.  He reports that his pain is well-controlled and he is able to get around with a walker.  He continues to decline physical therapy.  Plan: -Continue to follow with orthopedic surgery  -PT referral placed if he changes his mind, I strongly recommended PT for adequate rehabilitation

## 2023-10-13 ENCOUNTER — Ambulatory Visit: Payer: MEDICAID | Admitting: Physical Therapy

## 2023-10-14 ENCOUNTER — Other Ambulatory Visit: Payer: Self-pay | Admitting: Student

## 2023-10-14 MED ORDER — FERROUS SULFATE 300 (60 FE) MG/5ML PO SOLN: 300.0000 mg | Freq: Every day | ORAL | 0 refills | Status: DC

## 2023-10-14 NOTE — Progress Notes (Signed)
 History    Mr.  David [name]    returns for f/u visit.         He was diagnosed in November 2022 with a right hypopharyngeal /supraglottic SCCa, staged T3N3b.                  He underwent [facility]RT at [facility], completed 02/09/21      He has had a rough time since the last visit    [place] and broke his hip this summer, had a repair recently.   Now in a wheelchair.     He had a PEG placed in April 2023.  Taking   soft foods by mouth, some occasional nasal regurgitation.  PEG fell out in early 2024     Has lost a good deal of weight the last several months, mainly since the hip fracture.        He had a long hospitalization in summer 2023 for endocarditis /pneumonia.  No  further pneumonias       He underwent DL/biopsy here in Oct 2023 with benign findings         He cannot feel any neck masses or lymph nodes . He underwent US -guided lymph node core biopsy in August 2023; our pathologists did not see any viable tumor cells.       He underwent tracheotomy in May 2023.    Has a special modified bivona trach.   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 Vitals: Blood pressure 120/84, pulse 69, weight 36.3 kg (80 lb), SpO2 100%.    . General appearance of patient: thin, unhealthy . Mood - appears depressed, tearful at times  . Quality of voice: notable deep gravelly hoarseness    . Inspection of head and face: Normocephalic, without obvious abnormality, sinuses nontender to percussion   . Bilateral parotid glands soft, nontender, no swelling, no masses/lesions   . Facial strength: intact, symmetric   .  Oral cavity: poor  dentition, tongue mobile, FOM soft, mucosa healthy throughout with no masses,  lesions, ulcerations   . Oropharynx: mucosa of tonsillar fossae healthy; soft palate/uvula intact, mobile, mucosa healthy; tongue base soft, nontender; no mucosal ulcerations or masses or other worrisome lesions  . Mirror Examination: Attempted but difficult due to gag reflex.    . Neck: neck with severe torticollis to the right and severe radiation fibrosis, no palpable masses  . portex bivona cuffless trach in   place with  PMV           Flexible fiberoptic laryngoscopy - deferred      Data reviewed       CT NECK/THYROID  W CONTRAST, 03/26/2022 1:58 PM    FINDINGS:   Expected post-treatment changes are noted following radiation therapy and tracheostomy.    No evidence of masslike enhancement at the primary site. Similar diffuse edema of the supraglottic and glottic larynx likely related to treatment changes.   Redemonstrated multiple partially mineralized lymph nodes of the right greater than left neck. Comparison  of size to prior CT is difficult given current scan angle but appear not substantially changed. The largest node within the cluster of level 2A region nodes measures 3.6 cm in CC diameter on today's scan. No definite new lymph nodes identified.     Poor dentition with multiple caries and periapical lucencies involving multiple maxillary and mandibular teeth, partially imaged.    Emphysematous and treatment related changes of the imaged lungs.            09/14/21  LYMPH NODE, RIGHT NECK, BIOPSY:               Keratin granuloma-associated multinucleated giant cells .    The overall findings of keratin granuloma-associated multinucleated giant cells without viable tumor cells  are consistent with the regression of metastatic squamous cell carcinoma treated by chemoradiation therapy.           11/08/21   RIGHT PYRIFORM, BIOPSY:                Squamous mucosa with fibrosis.               Negative for high-grade dysplasia and carcinoma.           PET HEAD AND NECK  CANCER (W/LOW DOSE CT), 10/25/2021 2:41 PM      CONCLUSION:   1.  Status post chemoradiation of a right hypopharyngeal/supraglottic squamous cell carcinoma and subsequent tracheostomy with foci of increased FDG uptake visualized along the posterior aspect of the left cricoid cartilage and along the anterior aspect  of the right cricoarytenoid cartilage, which appear to correlate to foci of contrast enhancement seen on prior CT neck dated 08/11/2021 and are concerning for recurrent disease. Correlation with direct inspection and tissue sampling is recommended.  2.  Mild diffuse uptake along the tracheostomy site and central neck compartment is favored to represent posttreatment changes.  3.  Focus of mild FDG avidity likely within the upper esophagus could be further evaluated by endoscopy versus attention on follow-up imaging as clinically indicated. No definite associated soft tissue mass on CT is noted in this region.  4.  Non-FDG avid, peripherally calcified cervical lymph nodes are stable in comparison to prior CT neck. No new, enlarging or FDG avid lymph nodes are identified.  5.  Cardiomegaly, with significantly increased size of the right ventricle in comparison to prior. Recommend cardiologic consultation and echocardiogram if not already performed.    6.  Diffuse groundglass opacities and tree-in-bud opacifications with multiple subcentimeter nodules involving the right lung are new in comparison to prior PET CT, however improved  relative to most recent CT chest and likely represent evolution of prior pneumonia.                           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  He underwent chemoRT at [facility], completed  January 2023       He looks ill and weak today.   Describes wt loss, diffuse pain, and poor appetite.    He seems to be having failure to thrive.  We discussed  a hospital admission.  He and his mother are considering this.  I recommend discussing this with his PCP, who may want to admit him or have him on a hospitalist service.  They'll consider this      Return in    6 months ,  certainly sooner if there are any questions or problems.            All his questions were answered.              cc:  E Bender       The provider time of the today's visit was 45 minutes. This included time spent face-to-face obtaining history and performing examination, review of office notes from other providers, labs, imaging since our last visit, counseling and educating the patient/family/caregiver, ordering tests/treatments/procedures, communicating with other providers, care coordination, and/or documentation of the visit.   40-54 minutes: [id number]

## 2023-10-14 NOTE — Progress Notes (Signed)
 Internal [facility] Clinic Attending  Case discussed with the resident at the time of the visit.  We reviewed the resident's history and exam and pertinent patient test results.  I agree with the assessment, diagnosis, and plan of care documented in the resident's note.

## 2023-10-17 ENCOUNTER — Ambulatory Visit (HOSPITAL_COMMUNITY)
Admission: RE | Admit: 2023-10-17 | Discharge: 2023-10-17 | Disposition: A | Discharge status: Home / Self Care | Payer: MEDICAID | Source: Ambulatory Visit | Attending: Nurse Practitioner | Admitting: Nurse Practitioner

## 2023-10-17 DIAGNOSIS — M436 Torticollis: Secondary | ICD-10-CM | POA: Diagnosis present

## 2023-10-17 DIAGNOSIS — R1310 Dysphagia, unspecified: Secondary | ICD-10-CM | POA: Diagnosis not present

## 2023-10-17 DIAGNOSIS — Z930 Tracheostomy status: Secondary | ICD-10-CM

## 2023-10-17 DIAGNOSIS — J386 Stenosis of larynx: Secondary | ICD-10-CM

## 2023-10-17 DIAGNOSIS — J479 Bronchiectasis, uncomplicated: Secondary | ICD-10-CM

## 2023-10-17 DIAGNOSIS — R627 Adult failure to thrive: Secondary | ICD-10-CM | POA: Diagnosis present

## 2023-10-17 DIAGNOSIS — C329 Malignant neoplasm of larynx, unspecified: Secondary | ICD-10-CM | POA: Diagnosis present

## 2023-10-17 DIAGNOSIS — E43 Unspecified severe protein-calorie malnutrition: Secondary | ICD-10-CM | POA: Diagnosis present

## 2023-10-17 NOTE — Progress Notes (Signed)
 Tracheostomy Procedure Note  David [name] [id number] [birth year]  Pre Procedure Tracheostomy Information  Trach Brand: Portex Size: 60A170 Style: Uncuffed Secured by: Velcro   Procedure: Trach change, care and education.    Post Procedure Tracheostomy Information  Trach Brand: Portex Size: 60A170 Style: Uncuffed Secured by: Velcro   Post Procedure Evaluation:  ETCO2 positive color change from yellow to purple : Yes.   Vital signs: stable throughout. Patients current condition: stable Complications: No apparent complications Trach site exam: clean, dry, no drainage Wound care done: dry, clean, and 4 x 4 gauze Patient did tolerate procedure well.   Education: Discussed with Jenna, CCM-NP.  Prescription needs: None    Additional needs: New PMV given.

## 2023-10-17 NOTE — Progress Notes (Signed)
 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 Has had slow but steady overall decline over the summer. Specifically significant adult FTT w/ECOG 3. He remains hospice appropriate in my opinion. Still very debilitated, malnourished, his neck contraction has not improved. Just released from hospital after getting ORIF of right hip. Discharged to home 8/25.  Since d/c he really has declined further. He spends most of his day in bed. He is eating less and when he does eat or drink he is clearly aspirating clear liquids. His mother still checks on him daily, helps prepare meals, but that is about all he will let her help with. At my suggestion she got him thickener for his liquids but he refuses to use it.  He is very disheveled, not really keeping up with his hygiene. Suprisingly he is still able to walk (at least from his bed to bathroom), he has had no difficulty w/ incontinence.  He was seen over the week end for shortness of breath. CT chest was obtained. He did not have PE, it did show chronic airspace disease in mid and RLL as well as bronchiectatic changes which I am certain represents chronic aspiration. Also of note the distal tip of his trach was abutting the posterior wall of the trachea, which I suspect has added to diffuculty with airway clearance.   He was also recently seen by his surgical onc MD who also noted FTT and suggested [name] consider hospitalization for comfort care, he has declined this so far.   Presents today for planned visit and trach change. Also for follow up given the acute events of late   Review of Systems  Constitutional:  Positive for malaise/fatigue and weight loss. Negative for fever.  HENT:  Positive for congestion.        Nasal drainage  Eyes: Negative.   Respiratory:  Positive for cough, sputum production and shortness of breath.        Coughs w/ clear liquids   Cardiovascular: Negative.   Genitourinary: Negative.   Musculoskeletal: Negative.   Skin: Negative.   Neurological:  Positive for weakness.  Endo/Heme/Allergies: Negative.   Psychiatric/Behavioral:  Positive for depression.     Exam  General This is a cachectic frail 48 year old male who presents accompanied by his mother for planned tracheostomy change HEENT he has severe torticollis, his right ear is essentially touching his right shoulder, he has minimal neck mobility, his tracheostomy stoma has become progressively malpositioned, given the acute angulation of his head and neck.  When his tracheostomy was removed he had thick discolored mucus, appearing identical to the [employer] he had been drinking on the way in.  His phonation quality is about the same, tracheostomy stoma itself is unchanged other than the malpositioning. Cardiac regular rate and rhythm Abdomen soft nontender Extremities frail Neuro intact   procedure: the # 7 portex was removed.  Given the recent CT findings I felt that he needed additional length for his tracheostomy so that the current distal tip would bypass the current curvature of his trachea, and orient better towards the airway.  I did adjust the tracheostomy flange altering it so that the right side can more easily sit underneath his head/neck without causing pressure ulceration, other than not continue the typical #7 Portex without further alteration   Active Problems: Principal Problem:   Tracheostomy status (HCC) Active Problems:   Subglottic stenosis   Dysphagia  Bronchiectasis (HCC)   Laryngeal cancer (HCC)   Protein-calorie malnutrition, severe (HCC)   Torticollis   FTT (failure to thrive) in adult   Severe protein-calorie malnutrition (HCC)  Bronchiectasis without complication (HCC) Overview: Noted on CT imaging. Almost certainly a consequence of chronic aspiration.  He does have difficulty at times with mobilization of mucus I am hopeful  that the tracheostomy will change this some He has also been ordered a nebulizer with hypertonic saline after his last ER visit Plan I resent this order as he has not received his nebulizer    Tracheostomy status (HCC) Overview: Brand: shiley Size: 7 Portex Bivona cuffless  Last change in clinic: today 9/19  Discussion Given his progressive decline he is no longer a candidate to ever be considered for decannulation.  We have gone back to the regular #7 Portex tracheostomy to better orient the tracheostomy within the airway and avoid the distal tip being occluded by the posterior wall of the trachea.  He is tolerating PMV, however I think his dysphagia, aspiration, weight loss, severe physical deconditioning, and overall failure to thrive are really the driving factors in his life right now, much more than the tracheostomy which is primarily maintenance however needs frequent changing given his refusal to follow dysphagia recommendations.  His cuff mechanics have also gotten worse.   Plan ROV 4 weeks Cont PMV as tolerated HME as able Have ordered tracheal suction and oral suction device for home   Dysphagia, unspecified type Overview: Worse since his most recent hospitalization. Symptomatic w/ cough when he drinks clears.  Had clear Coca-Cola remanence in his tracheostomy and additional food particles noted.  He has increased tracheal secretions from the stoma itself.  His mother had picked up thickener to help with clear liquids, however he has refused these.  He is not really able to do much in the way of chin tuck, and his head/neck malpositioning has gotten so severe his mobility is quite limited. Plan Encouraged him to only eat and drink in the upright position and stay in this position for at least 45 minutes  Continue to encourage chin tuck swallow twice with fluids  Continue to encourage thickening of clear liquids, although I think this is a moot point  If he is not going to  cooperate with these current recommendations there is no point in SLP consultation, and at this point I think quality of life trumps any intervention   FTT (failure to thrive) in adult Overview: ECOG 3 He has had very significant progressive decline since August prior to his hip surgery.  He is weighing now close to 82 pounds.  Spends most of his day in bed.  He is disheveled, lost interest in self-care.  He is still able to ambulate short distances, he can get up from his bed and ambulate to the bathroom with a walker and sometimes independently, but he prefers to spend most of his time in bed.  We talked frankly about my concern that it seems that he has declined further since his fall.  We have seen him like this before earlier this spring, he manages to rally some, I am not sure he will be successful this time.  I remain completely committed to trying to assist with his quality of life, at this point he still desires to keep his independence and live in his own apartment.  His mother is more than ready to take him in at her house, and continue to care for him however  he adamantly continues to refuse this.  I reassured him that we are committed to his care, shared with him that I am worried should he decline further his risk of dying alone in his apartment would be quite high.  He has agreed that should things continue to decline he and his mother would contact us  and we could help facilitate inpatient comfort care when the time comes appropriate.  He again states he is not ready for this yet  If he continues to this current path, or has another setback such as a fall or pneumonia I really do not think he will survive the next 3 months  Plan Continue to offer supportive care He will continue to follow with palliative care Should he acutely decline, or slowly progress I really think the only reasonable intervention at that point would be comfort         58 minutes   Maude FORBES Banner  ACNP-BC [facility] Pulmonary/Critical Care Pager # [phone] OR # [phone] if no answer

## 2023-10-20 NOTE — Telephone Encounter (Signed)
 Dr.Bender can you sign this encounter, thanks.

## 2023-10-23 NOTE — Telephone Encounter (Signed)
 Message sent to Lela to f/u with the 3051 form which is a PCs Form Certification.  Copied from CRM [id number]. Topic: General - Other >> Oct 23, 2023 10:09 AM Farrel B wrote: Reason for CRM: Patient's mother states the patient has an appt tomorrow, and is needing a 3051 form completed to get some assistance In the home for the patient. Please call patient's mother to advise.

## 2023-10-24 ENCOUNTER — Ambulatory Visit (INDEPENDENT_AMBULATORY_CARE_PROVIDER_SITE_OTHER): Payer: MEDICAID | Admitting: Student

## 2023-10-24 ENCOUNTER — Encounter: Payer: Self-pay | Admitting: Student

## 2023-10-24 VITALS — BP 103/77 | HR 75 | Temp 98.3°F | Ht 70.0 in | Wt 81.0 lb

## 2023-10-24 DIAGNOSIS — Z7189 Other specified counseling: Secondary | ICD-10-CM | POA: Diagnosis not present

## 2023-10-24 DIAGNOSIS — E43 Unspecified severe protein-calorie malnutrition: Secondary | ICD-10-CM

## 2023-10-24 DIAGNOSIS — I959 Hypotension, unspecified: Secondary | ICD-10-CM

## 2023-10-24 NOTE — Progress Notes (Signed)
 CC: F/u visit  HPI: DavidDavid [name] is a 48 y.o. male living with a history stated below and presents today for F/u visit. Please see problem based assessment and plan for additional details.  Past Medical History:  Diagnosis Date   Acute respiratory failure with hypoxia (HCC)    AKI (acute kidney injury) 01/08/2021   Aspiration pneumonia of right lower lobe (HCC) 06/06/2021   Bacterial endocarditis    Chronic HFrEF (heart failure with reduced ejection fraction) (HCC) 06/09/2021   Community acquired pneumonia due to Pneumococcus 04/01/2021   Evaluation by medical service required 03/19/2022   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HCC)    Leaking PEG tube (HCC) 12/12/2021   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)    Pneumococcal bacteremia 06/09/2021   Pneumothorax    Left lung spontaneous pneumothorax at age 16 yr    S/P percutaneous endoscopic gastrostomy (PEG) tube placement (HCC) 06/24/2021    Current Outpatient Medications on File Prior to Visit  Medication Sig Dispense Refill   acetaminophen  (TYLENOL ) 325 MG tablet Take 2 tablets (650 mg total) by mouth 3 (three) times daily. 180 tablet 0   apixaban  (ELIQUIS ) 5 MG TABS tablet Take 1 tablet (5 mg total) by mouth 2 (two) times daily. 180 tablet 3   chlorhexidine  (HIBICLENS ) 4 % external liquid Apply 15 mLs (1 Application total) topically as directed for 30 doses. Use as directed daily for 5 days every other week for 6 weeks. 946 mL 1   diazePAM  5 MG/5ML SOLN Take 3 mLs (3 mg total) by mouth every 8 (eight) hours as needed. TAKE 3 MLS (3 MG TOTAL) BY MOUTH EVERY 8 (EIGHT) HOURS AS NEEDED FOR MUSCLE SPASMS (DYSPHAGIA). 120 mL 0   ferrous sulfate  300 (60 Fe) MG/5ML syrup TAKE 5 MLS (300 MG TOTAL) BY MOUTH DAILY. 450 mL 0   fluconazole  (DIFLUCAN ) 200 MG tablet Take 2 tablets (400 mg total) by mouth daily.     lidocaine  (XYLOCAINE ) 2 % solution Use as directed 15 mLs in the mouth or throat every 6 (six) hours as needed for mouth  pain. Mix 1part 2% viscous lidocaine , 1part water . Swish and spit 30mL of total diluted mixture up to eight times a day, prn as needed for soreness 200 mL 2   methadone  (DOLOPHINE ) 10 MG/ML solution Take 130 mg by mouth every 8 (eight) hours.     methocarbamol  (ROBAXIN ) 500 MG tablet Take 1 tablet (500 mg total) by mouth every 6 (six) hours as needed for muscle spasms. 28 tablet 0   Misc. Devices (ROLLATOR ULTRA-LIGHT) MISC Use as needed 1 each 0   Multiple Vitamin (MULTIVITAMIN WITH MINERALS) TABS tablet Take 1 tablet by mouth daily. 30 tablet 0   Pregabalin  20 MG/ML SOLN Take 50 mg by mouth 3 (three) times daily. 160 mL 2   sertraline  (ZOLOFT ) 20 MG/ML concentrated solution MIX 1.25 MLS (25 MG TOTAL) WITH 1/2 CUP OF WATER  AND TAKE BY MOUTH DAILY. 60 mL 2   sodium chloride  HYPERTONIC 3 % nebulizer solution Take by nebulization as needed for other. 750 mL 12   Vitamin D , Ergocalciferol , (DRISDOL ) 1.25 MG (50000 UNIT) CAPS capsule Take 1 capsule (50,000 Units total) by mouth every 7 (seven) days. 5 capsule 0   No current facility-administered medications on file prior to visit.    No family history on file.  Social History   Socioeconomic History   Marital status: Divorced    Spouse name: Not on file   Number of  children: Not on file   Years of education: Not on file   Highest education level: Not on file  Occupational History   Not on file  Tobacco Use   Smoking status: Every Day    Types: Cigarettes   Smokeless tobacco: Never   Tobacco comments:    1 cigs per day  Vaping Use   Vaping status: Never Used  Substance and Sexual Activity   Alcohol use: Not Currently    Alcohol/week: 12.0 standard drinks of alcohol    Types: 12 Cans of beer per week   Drug use: Not Currently    Types: IV    Comment: heroin (re-est w/ Methadone  clinic as of 11/30/20)   Sexual activity: Not on file  Other Topics Concern   Not on file  Social History Narrative   Not on file   Social Drivers of  Health   Financial Resource Strain: Low Risk  (03/19/2022)   Overall Financial Resource Strain (CARDIA)    Difficulty of Paying Living Expenses: Not hard at all  Food Insecurity: Patient Declined (09/15/2023)   Hunger Vital Sign    Worried About Running Out of Food in the Last Year: Patient declined    Ran Out of Food in the Last Year: Patient declined  Transportation Needs: Patient Declined (09/15/2023)   PRAPARE - Administrator, Civil Service (Medical): Patient declined    Lack of Transportation (Non-Medical): Patient declined  Physical Activity: Insufficiently Active (03/19/2022)   Exercise Vital Sign    Days of Exercise per Week: 3 days    Minutes of Exercise per Session: 20 min  Stress: No Stress Concern Present (03/19/2022)   [employer] of Occupational Health - Occupational Stress Questionnaire    Feeling of Stress : Only a little  Social Connections: Socially Isolated (03/19/2022)   Social Connection and Isolation Panel    Frequency of Communication with Friends and Family: More than three times a week    Frequency of Social Gatherings with Friends and Family: Three times a week    Attends Religious Services: Never    Active Member of Clubs or Organizations: No    Attends Banker Meetings: Never    Marital Status: Divorced  Catering manager Violence: Patient Declined (09/15/2023)   Humiliation, Afraid, Rape, and Kick questionnaire    Fear of Current or Ex-Partner: Patient declined    Emotionally Abused: Patient declined    Physically Abused: Patient declined    Sexually Abused: Patient declined    Review of Systems: ROS negative except for what is noted on the assessment and plan.  Vitals:   10/24/23 0903 10/24/23 0939 10/24/23 0952 10/24/23 1010  BP: (!) 69/54 (!) 81/56 (!) 80/65 103/77  Pulse: 80 79 76 75  Temp: 98.3 F (36.8 C)     TempSrc: Oral     SpO2: 97%     Weight: 81 lb (36.7 kg)     Height: 5' 10 (1.778 m)      Physical  Exam: Constitutional: alert, thin and cachetic, chronically ill-appearing, in no acute distress Cardiovascular: regular rate and rhythm Pulmonary/Chest: normal work of breathing on room air, lungs clear to auscultation bilaterally Neurological: alert & oriented x 3  Assessment & Plan:   Assessment & Plan Hypotension, unspecified hypotension type Initially on arrival 69/54.  Patient denies any symptoms including lightheadedness, dizziness or presyncope.  Patient states he is feeling fine without any acute concerns.  Repeat blood pressures with systolic in the 80s  with oral fluids (MAP ~65).  Denies any recent illness or fevers.  Denies any new respiratory symptoms including cough or shortness of breath.  Denies any new urinary symptoms.  States he is now eating more after his hip fracture repair.  Discussed my concerns with patient and patient's mother regarding his hypotension and overall prognosis/condition.  Discussed GOC and hospice, patient declined hospice and states he is getting betterand eating more now.  Recommendations for evaluation in the emergency room due to his hypotension which was discussed with patient and patient's mother.  Declined EMS transport at this time.  Patient is aware of the risks of private transport and not being evaluated in the ED.  Of note, was on midodrine  at some point but was discontinued.  Can consider resuming midodrine  after evaluation in the ED. Protein-calorie malnutrition, severe Continues worsening of failure to thrive specially after hospitalization. Reviewed notes from trach care and ENT visits. Weight has decreased to 81 lbs today from 83 lb on 9/12, was around 90 lbs in August.  States he is eating more now after his hip fracture repair. Reports eating sandwich, peach cobbler, mashed potatoes, biscuit gravy yesterday.  Advanced care planning/counseling discussion Discussed extensively today with patient and patient's mother in office.  Expressed  concern given his chronic conditions and his overall health.  Has been told by other providers regarding failure to thrive.  He states I am eating more now.  Discussed malnutrition and further weight loss. He has chronic trach and risk of aspiration. We discussed palliative care and hospice care which patient declined the latter due to stating I am going to get better. At this time he will continue current care. Follows with palliative medicine outpatient. Will continue GOC discussions at future visits.   Recommended to patient and patient's mother to be evaluated in ED due to hypotension.  Declined EMS transport and will go by POV. Patient is aware of the risks and will go by POV.  Return in about 2 weeks (around 11/07/2023) for follow up.   Patient discussed with Dr. CHARLENA Rosan Ozell Elicia, D.O. [facility] Internal Medicine, PGY-3 Phone: [phone] Date 10/24/2023 Time 2:58 PM

## 2023-10-24 NOTE — Patient Instructions (Addendum)
 Thank you, [name] for allowing us  to provide your care today. Today we discussed:  -Your blood pressure is low. Recommend going to the Emergency Room for further evaluation.  -May need to consider restarting the midodrine  -Discussed goals of care, will continue discuss at future visits   Follow up: 2-4 weeks   Should you have any questions or concerns please call the internal medicine clinic at [phone].    Osby Sweetin, D.O. [facility]

## 2023-10-25 NOTE — Assessment & Plan Note (Addendum)
 Initially on arrival 69/54.  Patient denies any symptoms including lightheadedness, dizziness or presyncope.  Patient states he is feeling fine without any acute concerns.  Repeat blood pressures with systolic in the 80s with oral fluids (MAP ~65).  Denies any recent illness or fevers.  Denies any new respiratory symptoms including cough or shortness of breath.  Denies any new urinary symptoms.  States he is now eating more after his hip fracture repair.  Discussed my concerns with patient and patient's mother regarding his hypotension and overall prognosis/condition.  Discussed GOC and hospice, patient declined hospice and states he is getting betterand eating more now.  Recommendations for evaluation in the emergency room due to his hypotension which was discussed with patient and patient's mother.  Declined EMS transport at this time.  Patient is aware of the risks of private transport and not being evaluated in the ED.  Of note, was on midodrine  at some point but was discontinued.  Can consider resuming midodrine  after evaluation in the ED.

## 2023-10-25 NOTE — Assessment & Plan Note (Signed)
 Continues worsening of failure to thrive specially after hospitalization. Reviewed notes from trach care and ENT visits. Weight has decreased to 81 lbs today from 83 lb on 9/12, was around 90 lbs in August.  States he is eating more now after his hip fracture repair. Reports eating sandwich, peach cobbler, mashed potatoes, biscuit gravy yesterday.

## 2023-10-27 ENCOUNTER — Other Ambulatory Visit: Payer: Self-pay

## 2023-10-27 ENCOUNTER — Telehealth: Payer: Self-pay | Admitting: *Deleted

## 2023-10-27 DIAGNOSIS — F419 Anxiety disorder, unspecified: Secondary | ICD-10-CM

## 2023-10-27 DIAGNOSIS — C329 Malignant neoplasm of larynx, unspecified: Secondary | ICD-10-CM

## 2023-10-27 DIAGNOSIS — Z515 Encounter for palliative care: Secondary | ICD-10-CM

## 2023-10-27 DIAGNOSIS — M62838 Other muscle spasm: Secondary | ICD-10-CM

## 2023-10-27 DIAGNOSIS — R627 Adult failure to thrive: Secondary | ICD-10-CM

## 2023-10-27 DIAGNOSIS — E43 Unspecified severe protein-calorie malnutrition: Secondary | ICD-10-CM

## 2023-10-27 NOTE — Progress Notes (Signed)
 Internal [facility] Clinic Attending  Case discussed with the resident at the time of the visit.  We reviewed the resident's history and exam and pertinent patient test results.  I agree with the assessment, diagnosis, and plan of care documented in the resident's note.

## 2023-10-27 NOTE — Telephone Encounter (Unsigned)
 Copied from CRM [id number]. Topic: Clinical - Medication Question >> Oct 24, 2023  3:27 PM Miquel SAILOR wrote: Reason for CRM: Patient mother Audie calling due to PT had app 09/26 due to PT had low BP and did not see any medication to the pharmacy. Needs call back id medication needed or not [phone]

## 2023-10-27 NOTE — Telephone Encounter (Signed)
 Refill request, please see associated orders.

## 2023-10-28 ENCOUNTER — Other Ambulatory Visit: Payer: Self-pay | Admitting: Student

## 2023-10-28 ENCOUNTER — Telehealth: Payer: Self-pay | Admitting: *Deleted

## 2023-10-28 MED ORDER — FLUCONAZOLE 200 MG PO TABS: 400.0000 mg | ORAL_TABLET | Freq: Every day | ORAL | 0 refills | Status: DC

## 2023-10-28 MED ORDER — DIAZEPAM 5 MG/5ML PO SOLN
3.0000 mg | Freq: Three times a day (TID) | ORAL | 0 refills | Status: DC | PRN
Start: 2023-10-28 — End: 2023-12-03

## 2023-10-28 NOTE — Telephone Encounter (Signed)
 Returned pt's call - talked to [place]. Stated pt needs a refill on Fluconazole  today.

## 2023-10-28 NOTE — Telephone Encounter (Signed)
 Copied from CRM [id number]. Topic: Clinical - Prescription Issue >> Oct 28, 2023  1:07 PM Shamecia H wrote: Reason for CRM: I just sent a rx refill for fluconazole  (DIFLUCAN ) 200 MG tablet and the patient just took his last two today and he is completely out of the medicine.

## 2023-10-28 NOTE — Telephone Encounter (Unsigned)
 Copied from CRM [id number]. Topic: Clinical - Medication Refill >> Oct 28, 2023  1:04 PM Shamecia H wrote: Medication: fluconazole  (DIFLUCAN ) 200 MG tablet  Has the patient contacted their pharmacy? Yes (Agent: If no, request that the patient contact the pharmacy for the refill. If patient does not wish to contact the pharmacy document the reason why and proceed with request.) (Agent: If yes, when and what did the pharmacy advise?)  This is the patient's preferred pharmacy:  [id number] - [place], [place] - [address] AT [facility] [address] [place] [address] Phone: [phone] Fax: [phone]  Is this the correct pharmacy for this prescription? Yes If no, delete pharmacy and type the correct one.   Has the prescription been filled recently? Yes  Is the patient out of the medication? Yes  Has the patient been seen for an appointment in the last year OR does the patient have an upcoming appointment? Yes  Can we respond through MyChart? Yes  Agent: Please be advised that Rx refills may take up to 3 business days. We ask that you follow-up with your pharmacy.

## 2023-10-29 DIAGNOSIS — Z7189 Other specified counseling: Secondary | ICD-10-CM | POA: Insufficient documentation

## 2023-10-29 NOTE — Assessment & Plan Note (Signed)
 Discussed extensively today with patient and patient's mother in office.  Expressed concern given his chronic conditions and his overall health.  Has been told by other providers regarding failure to thrive.  He states I am eating more now.  Discussed malnutrition and further weight loss. He has chronic trach and risk of aspiration. We discussed palliative care and hospice care which patient declined the latter due to stating I am going to get better. At this time he will continue current care. Discussion for completing MOST form, deferred, will complete at future visit. Follows with palliative medicine outpatient. Will continue GOC discussions at future visits, patient verbalizes understanding and agreed.   I spent 20 minutes dedicated for advance care planning.

## 2023-11-04 ENCOUNTER — Other Ambulatory Visit: Payer: Self-pay

## 2023-11-04 DIAGNOSIS — C329 Malignant neoplasm of larynx, unspecified: Secondary | ICD-10-CM

## 2023-11-04 MED ORDER — LIDOCAINE VISCOUS HCL 2 % MT SOLN: 15.0000 mL | Freq: Four times a day (QID) | OROMUCOSAL | 2 refills | Status: AC | PRN

## 2023-11-10 ENCOUNTER — Other Ambulatory Visit: Payer: Self-pay

## 2023-11-10 ENCOUNTER — Emergency Department (HOSPITAL_COMMUNITY): Payer: MEDICAID

## 2023-11-10 ENCOUNTER — Emergency Department (HOSPITAL_COMMUNITY)
Admission: EM | Admit: 2023-11-10 | Discharge: 2023-11-10 | Disposition: A | Discharge status: Home / Self Care | Payer: MEDICAID

## 2023-11-10 DIAGNOSIS — C329 Malignant neoplasm of larynx, unspecified: Secondary | ICD-10-CM | POA: Insufficient documentation

## 2023-11-10 DIAGNOSIS — R059 Cough, unspecified: Secondary | ICD-10-CM | POA: Insufficient documentation

## 2023-11-10 DIAGNOSIS — E43 Unspecified severe protein-calorie malnutrition: Secondary | ICD-10-CM | POA: Diagnosis not present

## 2023-11-10 DIAGNOSIS — Z7901 Long term (current) use of anticoagulants: Secondary | ICD-10-CM | POA: Diagnosis not present

## 2023-11-10 DIAGNOSIS — R770 Abnormality of albumin: Secondary | ICD-10-CM | POA: Insufficient documentation

## 2023-11-10 DIAGNOSIS — Z515 Encounter for palliative care: Secondary | ICD-10-CM

## 2023-11-10 DIAGNOSIS — R110 Nausea: Secondary | ICD-10-CM

## 2023-11-10 DIAGNOSIS — G893 Neoplasm related pain (acute) (chronic): Secondary | ICD-10-CM

## 2023-11-10 LAB — CBC WITH DIFFERENTIAL/PLATELET
Abs Immature Granulocytes: 0.01 K/uL (ref 0.00–0.07)
Basophils Absolute: 0 K/uL (ref 0.0–0.1)
Basophils Relative: 1 %
Eosinophils Absolute: 0.1 K/uL (ref 0.0–0.5)
Eosinophils Relative: 1 %
HCT: 35.8 % — ABNORMAL LOW (ref 39.0–52.0)
Hemoglobin: 10.9 g/dL — ABNORMAL LOW (ref 13.0–17.0)
Immature Granulocytes: 0 %
Lymphocytes Relative: 9 %
Lymphs Abs: 0.5 K/uL — ABNORMAL LOW (ref 0.7–4.0)
MCH: 26.5 pg (ref 26.0–34.0)
MCHC: 30.4 g/dL (ref 30.0–36.0)
MCV: 86.9 fL (ref 80.0–100.0)
Monocytes Absolute: 0.5 K/uL (ref 0.1–1.0)
Monocytes Relative: 10 %
Neutro Abs: 4.3 K/uL (ref 1.7–7.7)
Neutrophils Relative %: 79 %
Platelets: 314 K/uL (ref 150–400)
RBC: 4.12 MIL/uL — ABNORMAL LOW (ref 4.22–5.81)
RDW: 16.9 % — ABNORMAL HIGH (ref 11.5–15.5)
WBC: 5.5 K/uL (ref 4.0–10.5)
nRBC: 0 % (ref 0.0–0.2)

## 2023-11-10 LAB — COMPREHENSIVE METABOLIC PANEL WITH GFR
ALT: 14 U/L (ref 0–44)
AST: 39 U/L (ref 15–41)
Albumin: 3 g/dL — ABNORMAL LOW (ref 3.5–5.0)
Alkaline Phosphatase: 74 U/L (ref 38–126)
Anion gap: 14 (ref 5–15)
BUN: 7 mg/dL (ref 6–20)
CO2: 25 mmol/L (ref 22–32)
Calcium: 9.2 mg/dL (ref 8.9–10.3)
Chloride: 95 mmol/L — ABNORMAL LOW (ref 98–111)
Creatinine, Ser: 0.82 mg/dL (ref 0.61–1.24)
GFR, Estimated: >60 mL/min (ref >60)
Glucose, Bld: 186 mg/dL — ABNORMAL HIGH (ref 70–99)
Potassium: 3.8 mmol/L (ref 3.5–5.1)
Sodium: 134 mmol/L — ABNORMAL LOW (ref 135–145)
Total Bilirubin: 0.6 mg/dL (ref 0.0–1.2)
Total Protein: 7.2 g/dL (ref 6.5–8.1)

## 2023-11-10 LAB — RESP PANEL BY RT-PCR (RSV, FLU A&B, COVID)  RVPGX2
Influenza A by PCR: NEGATIVE
Influenza B by PCR: NEGATIVE
Resp Syncytial Virus by PCR: NEGATIVE
SARS Coronavirus 2 by RT PCR: NEGATIVE

## 2023-11-10 MED ORDER — KETOROLAC TROMETHAMINE 15 MG/ML IJ SOLN
15.0000 mg | Freq: Once | INTRAMUSCULAR | Status: AC
  Administered 2023-11-10: 15 mg via INTRAVENOUS
  Filled 2023-11-10: qty 1

## 2023-11-10 MED ORDER — ONDANSETRON 4 MG PO TBDP: 4.0000 mg | ORAL_TABLET | Freq: Three times a day (TID) | ORAL | 0 refills | Status: AC | PRN

## 2023-11-10 MED ORDER — ONDANSETRON HCL 4 MG/2ML IJ SOLN
4.0000 mg | Freq: Once | INTRAMUSCULAR | Status: AC
  Administered 2023-11-10: 4 mg via INTRAVENOUS
  Filled 2023-11-10: qty 2

## 2023-11-10 MED ORDER — PREGABALIN 20 MG/ML PO SOLN: 50.0000 mg | Freq: Three times a day (TID) | ORAL | 0 refills | Status: DC

## 2023-11-10 NOTE — ED Provider Notes (Signed)
 [place] EMERGENCY DEPARTMENT AT [facility] Provider Note   CSN: [id number] Arrival date & time: 11/10/23  9252     Patient presents with: Nausea and Post-op Problem   David [name] is a 48 y.o. male.  Patient past history significant for stage IV laryngeal cancer, tracheostomy, recent hip fracture, severe protein calorie malnutrition here with concerns of nausea and postoperative pain.  He reports has had poor appetite ongoing for the last several days with no reported vomiting or diarrhea.  States that he does feel nauseous and this is likely impeding his ability to eat.  Denies any recent falls but states that he has pain ongoing in the right hip since surgery.  He currently takes pregabalin  and Robaxin  for pain as he is currently on methadone  due to prior history of heroin dependence.  Does not feel that his pain is currently well-controlled at home.  HPI     Prior to Admission medications   Medication Sig Start Date End Date Taking? Authorizing Provider  ondansetron  (ZOFRAN -ODT) 4 MG disintegrating tablet Take 1 tablet (4 mg total) by mouth every 8 (eight) hours as needed for nausea or vomiting. 11/10/23  Yes Davontae Prusinski A, PA-C  acetaminophen  (TYLENOL ) 325 MG tablet Take 2 tablets (650 mg total) by mouth 3 (three) times daily. 09/19/23   Charmayne Holmes, DO  apixaban  (ELIQUIS ) 5 MG TABS tablet Take 1 tablet (5 mg total) by mouth 2 (two) times daily. 09/20/23   Charmayne Holmes, DO  chlorhexidine  (HIBICLENS ) 4 % external liquid Apply 15 mLs (1 Application total) topically as directed for 30 doses. Use as directed daily for 5 days every other week for 6 weeks. 09/17/23   Danton Lauraine LABOR, PA-C  diazePAM  5 MG/5ML SOLN Take 3 mLs (3 mg total) by mouth every 8 (eight) hours as needed. TAKE 3 MLS (3 MG TOTAL) BY MOUTH EVERY 8 (EIGHT) HOURS AS NEEDED FOR MUSCLE SPASMS (DYSPHAGIA). 10/28/23   Pickenpack-Cousar, Fannie SAILOR, NP  ferrous sulfate  300 (60 Fe) MG/5ML syrup TAKE 5 MLS (300 MG  TOTAL) BY MOUTH DAILY. 10/20/23 01/18/24  Kandis Perkins, DO  fluconazole  (DIFLUCAN ) 200 MG tablet Take 2 tablets (400 mg total) by mouth daily. 10/28/23   D'Mello, Rosalyn, DO  lidocaine  (XYLOCAINE ) 2 % solution Use as directed 15 mLs in the mouth or throat every 6 (six) hours as needed for mouth pain. Mix 1part 2% viscous lidocaine , 1part water . Swish and spit 30mL of total diluted mixture up to eight times a day, prn as needed for soreness 11/04/23   Pickenpack-Cousar, Fannie SAILOR, NP  methadone  (DOLOPHINE ) 10 MG/ML solution Take 130 mg by mouth every 8 (eight) hours.    [provider]  methocarbamol  (ROBAXIN ) 500 MG tablet Take 1 tablet (500 mg total) by mouth every 6 (six) hours as needed for muscle spasms. 09/19/23   Lovie Clarity, MD  Misc. Devices (ROLLATOR ULTRA-LIGHT) MISC Use as needed 09/19/23   Charmayne Holmes, DO  Multiple Vitamin (MULTIVITAMIN WITH MINERALS) TABS tablet Take 1 tablet by mouth daily. 09/20/23   Charmayne Holmes, DO  Pregabalin  20 MG/ML SOLN Take 50 mg by mouth 3 (three) times daily. 11/10/23   Clotine Heiner A, PA-C  sertraline  (ZOLOFT ) 20 MG/ML concentrated solution MIX 1.25 MLS (25 MG TOTAL) WITH 1/2 CUP OF WATER  AND TAKE BY MOUTH DAILY. 07/21/23   Kandis Perkins, DO  sodium chloride  HYPERTONIC 3 % nebulizer solution Take by nebulization as needed for other. 10/11/23   Robinson, John K, PA-C  Vitamin D , Ergocalciferol , (DRISDOL ) 1.25 MG (50000 UNIT) CAPS capsule Take 1 capsule (50,000 Units total) by mouth every 7 (seven) days. 09/23/23   Lovie Clarity, MD    Allergies: Patient has no known allergies.    Review of Systems  Gastrointestinal:  Positive for nausea.  Musculoskeletal:        Right hip pain  All other systems reviewed and are negative.   Updated Vital Signs BP (!) 134/98 (BP Location: Left Arm)   Pulse 81   Temp 98 F (36.7 C) (Oral)   Resp 16   Ht 5' 10 (1.778 m)   Wt 34.9 kg   SpO2 100%   BMI 11.05 kg/m   Physical Exam Vitals and nursing note  reviewed.  Constitutional:      General: He is not in acute distress.    Appearance: He is cachectic. He is not ill-appearing, toxic-appearing or diaphoretic.  HENT:     Head: Normocephalic and atraumatic.  Eyes:     Conjunctiva/sclera: Conjunctivae normal.  Cardiovascular:     Rate and Rhythm: Normal rate and regular rhythm.     Heart sounds: No murmur heard. Pulmonary:     Effort: Pulmonary effort is normal. No respiratory distress.     Breath sounds: Normal breath sounds. No wheezing or rales.  Abdominal:     General: Abdomen is flat. Bowel sounds are normal. There is no distension.     Palpations: Abdomen is soft.     Tenderness: There is no abdominal tenderness. There is no guarding.  Musculoskeletal:        General: No swelling.     Cervical back: Neck supple.     Comments: TTP over the right hip with no gross deformity. ROM difficult due to pain and feelings of weakness.  Skin:    General: Skin is warm and dry.     Capillary Refill: Capillary refill takes less than 2 seconds.     Comments: Well healed incision site of the right hip. No signs of cellulitic changes or abscess.  Neurological:     Mental Status: He is alert.  Psychiatric:        Mood and Affect: Mood normal.     (all labs ordered are listed, but only abnormal results are displayed) Labs Reviewed  CBC WITH DIFFERENTIAL/PLATELET - Abnormal; Notable for the following components:      Result Value   RBC 4.12 (*)    Hemoglobin 10.9 (*)    HCT 35.8 (*)    RDW 16.9 (*)    Lymphs Abs 0.5 (*)    All other components within normal limits  COMPREHENSIVE METABOLIC PANEL WITH GFR - Abnormal; Notable for the following components:   Sodium 134 (*)    Chloride 95 (*)    Glucose, Bld 186 (*)    Albumin  3.0 (*)    All other components within normal limits  RESP PANEL BY RT-PCR (RSV, FLU A&B, COVID)  [id number]    EKG: EKG Interpretation Date/Time:  Monday November 10 2023 08:02:43 EDT Ventricular Rate:  85 PR  Interval:  150 QRS Duration:  102 QT Interval:  423 QTC Calculation: 491 R Axis:   72  Text Interpretation: Sinus rhythm Multiform ventricular premature complexes Probable left atrial enlargement Baseline artifact Confirmed by Gennaro Bouchard ([id number]) on 11/10/2023 8:04:26 AM  Radiology: [facility] Pelvis Portable Result Date: 11/10/2023 CLINICAL DATA:  Hip pain after hip replacement 2 months ago. EXAM: PORTABLE PELVIS 1-2 VIEWS COMPARISON:  09/17/2023. FINDINGS: Right hip  replacement. Femoral component has been incompletely visualized. Bony anatomy of the pelvis is intact. SI joints and symphysis pubis unremarkable. IMPRESSION: Right hip replacement. No acute bony findings. Femoral component of the prosthesis is not been fully visualized. Dedicated hip x-rays could be used to further evaluate as clinically warranted. Electronically Signed   By: Camellia Candle M.D.   On: 11/10/2023 09:30   DG Chest Portable 1 View Result Date: 11/10/2023 CLINICAL DATA:  Cough. EXAM: PORTABLE CHEST 1 VIEW COMPARISON:  10/11/2023 FINDINGS: Lungs are hyperexpanded. The patient's face obscures the right apex with parenchymal scarring noted in the right upper lung. Pleuroparenchymal scarring in the left apex is similar. No focal consolidation or pulmonary edema. No substantial pleural effusion. The cardiopericardial silhouette is within normal limits for size. Telemetry leads overlie the chest. IMPRESSION: Hyperexpansion with chronic biapical pleuroparenchymal scarring. No acute cardiopulmonary findings. Electronically Signed   By: Camellia Candle M.D.   On: 11/10/2023 09:27     Procedures   Medications Ordered in the ED  ondansetron  (ZOFRAN ) injection 4 mg (4 mg Intravenous Given 11/10/23 0829)  ketorolac  (TORADOL ) 15 MG/ML injection 15 mg (15 mg Intravenous Given 11/10/23 9170)                                    Medical Decision Making Amount and/or Complexity of Data Reviewed Labs: ordered. Radiology:  ordered.  Risk Prescription drug management.   This patient presents to the ED for concern of nausea, hip pain, this involves an extensive number of treatment options, and is a complaint that carries with it a high risk of complications and morbidity.  The differential diagnosis includes malnutrition, GERD, laryngeal cancer, pneumonia   Co morbidities that complicate the patient evaluation  Stage IV laryngeal cancer, postoperative right hip arthroplasty   Lab Tests:  I Ordered, and personally interpreted labs.  The pertinent results include: CBC unremarkable slightly improved hemoglobin at 10.9, CMP with mild electrolyte derangements of sodium and chloride slightly diminished and albumin  low at 3.0 but no signs of AKI, respiratory panel negative  Imaging Studies ordered:  I ordered imaging studies including chest x-ray, pelvis x-ray I independently visualized and interpreted imaging which showed chest and pelvic x-rays negative for any acute findings I agree with the radiologist interpretation   Cardiac Monitoring: / EKG:  The patient was maintained on a cardiac monitor.  I personally viewed and interpreted the cardiac monitored which showed an underlying rhythm of: Significant artifact present but appears to be sinus rhythm with borderline QT prolongation   Consultations Obtained:  I requested consultation with none,  and discussed lab and imaging findings as well as pertinent plan - they recommend: N/A   Problem List / ED Course / Critical interventions / Medication management  Patient presents the emergency department with concerns of nausea and right hip pain.  He reports that he had hip replacement about 2 weeks ago but on chart review appears to be closer to about 2 months ago.  He states that he has ongoing pain in this area is not currently any medication for pain to control his pain well.  He also reports that he has poor appetite and has been nauseous for the last 4 to  5 days.  Denies any vomiting or diarrhea.  No reported fevers.  Does endorse a slight cough but denies any productive phlegm. Exam reveals a cachectic appearing male.  There is slight  tenderness over the right hip but the incision site appears to be well-healed and no obvious bony deformity is noticed.  Heart and lung sounds unremarkable but difficult to fully assess due to patient positioning and difficult to sit upright. Will proceed with basic labs and x-ray of the chest and pelvis for further evaluation to assess for any possible cause of patient's pain as well as nausea and cough. Workup unremarkable with what appears to be improving hemoglobin at 10.9 and CMP without signs of AKI.  Albumin  still running low at 3.0 likely indicating persistent protein calorie malnutrition.  Respiratory panel negative.  Chest x-ray and pelvis x-ray negative for any acute findings.  Reports improvement after antiemetic and Toradol .  Tolerating p.o. and states that his appetite feels to have returned after his nausea was suppressed with the Zofran .  Will discharge home with a prescription for Zofran  and a refill patient's pregabalin .  Advised patient that he requires further refills to be managed by his palliative care team.  Return precautions discussed such as concerns for new or worsening symptoms.  Discharged home in stable condition. I ordered medication including Toradol , Zofran  for pain, nausea Reevaluation of the patient after these medicines showed that the patient improved I have reviewed the patients home medicines and have made adjustments as needed   Social Determinants of Health:  Laryngeal cancer   Test / Admission - Considered:  Admission considered but patient stable for outpatient follow-up.  Final diagnoses:  Nausea  Laryngeal cancer (HCC)  Severe protein-calorie malnutrition    ED Discharge Orders          Ordered    ondansetron  (ZOFRAN -ODT) 4 MG disintegrating tablet  Every 8 hours  PRN        11/10/23 1048    Pregabalin  20 MG/ML SOLN  3 times daily        11/10/23 1048               Musa Rewerts A, PA-C 11/10/23 1051    Kammerer, Megan L, DO 11/11/23 1133

## 2023-11-10 NOTE — Discharge Instructions (Signed)
 You were seen in the ER today for concerns of pain and nausea. Your labs and imaging were thankfully reassuring but your protein level is still running low. Since you have been nauseous and this has been impeding your ability to eat well, I have started you on an anti-nausea medication called Zofran  to try to help with this. Please take this as prescribed. I have also sent a refill of your Pregabalin  to your pharmacy but will you need to continue to follow up with your palliative care team for further refills of this medication. Return to the ER for any concerns of new or worsening symptoms.

## 2023-11-10 NOTE — ED Triage Notes (Signed)
 Pt had hip replacement surgery 2 weeks ago and c/o hip pain, nausea, and loss of appetite since surgery. Pt also has trach for last 2 years. FTT. AOX4.

## 2023-11-19 ENCOUNTER — Encounter (HOSPITAL_COMMUNITY): Payer: Self-pay

## 2023-11-19 ENCOUNTER — Other Ambulatory Visit: Payer: Self-pay

## 2023-11-19 ENCOUNTER — Emergency Department (HOSPITAL_COMMUNITY): Payer: MEDICAID

## 2023-11-19 ENCOUNTER — Emergency Department (HOSPITAL_COMMUNITY)
Admission: EM | Admit: 2023-11-19 | Discharge: 2023-11-19 | Disposition: A | Discharge status: Home / Self Care | Payer: MEDICAID | Attending: Emergency Medicine | Admitting: Emergency Medicine

## 2023-11-19 ENCOUNTER — Other Ambulatory Visit: Payer: Self-pay | Admitting: Acute Care

## 2023-11-19 DIAGNOSIS — Z7901 Long term (current) use of anticoagulants: Secondary | ICD-10-CM | POA: Insufficient documentation

## 2023-11-19 DIAGNOSIS — R1310 Dysphagia, unspecified: Secondary | ICD-10-CM

## 2023-11-19 DIAGNOSIS — R0602 Shortness of breath: Secondary | ICD-10-CM | POA: Insufficient documentation

## 2023-11-19 DIAGNOSIS — J439 Emphysema, unspecified: Secondary | ICD-10-CM

## 2023-11-19 DIAGNOSIS — E43 Unspecified severe protein-calorie malnutrition: Secondary | ICD-10-CM

## 2023-11-19 DIAGNOSIS — T17908A Unspecified foreign body in respiratory tract, part unspecified causing other injury, initial encounter: Secondary | ICD-10-CM

## 2023-11-19 DIAGNOSIS — R0600 Dyspnea, unspecified: Secondary | ICD-10-CM

## 2023-11-19 DIAGNOSIS — Z8521 Personal history of malignant neoplasm of larynx: Secondary | ICD-10-CM | POA: Diagnosis not present

## 2023-11-19 DIAGNOSIS — F17210 Nicotine dependence, cigarettes, uncomplicated: Secondary | ICD-10-CM

## 2023-11-19 DIAGNOSIS — J479 Bronchiectasis, uncomplicated: Secondary | ICD-10-CM | POA: Diagnosis not present

## 2023-11-19 DIAGNOSIS — J690 Pneumonitis due to inhalation of food and vomit: Secondary | ICD-10-CM | POA: Diagnosis not present

## 2023-11-19 DIAGNOSIS — R627 Adult failure to thrive: Secondary | ICD-10-CM

## 2023-11-19 LAB — CBC WITH DIFFERENTIAL/PLATELET
Abs Immature Granulocytes: 0.03 K/uL (ref 0.00–0.07)
Basophils Absolute: 0 K/uL (ref 0.0–0.1)
Basophils Relative: 1 %
Eosinophils Absolute: 0.1 K/uL (ref 0.0–0.5)
Eosinophils Relative: 1 %
HCT: 33.8 % — ABNORMAL LOW (ref 39.0–52.0)
Hemoglobin: 10.3 g/dL — ABNORMAL LOW (ref 13.0–17.0)
Immature Granulocytes: 0 %
Lymphocytes Relative: 7 %
Lymphs Abs: 0.5 K/uL — ABNORMAL LOW (ref 0.7–4.0)
MCH: 26.3 pg (ref 26.0–34.0)
MCHC: 30.5 g/dL (ref 30.0–36.0)
MCV: 86.4 fL (ref 80.0–100.0)
Monocytes Absolute: 0.4 K/uL (ref 0.1–1.0)
Monocytes Relative: 5 %
Neutro Abs: 5.8 K/uL (ref 1.7–7.7)
Neutrophils Relative %: 86 %
Platelets: 246 K/uL (ref 150–400)
RBC: 3.91 MIL/uL — ABNORMAL LOW (ref 4.22–5.81)
RDW: 16.4 % — ABNORMAL HIGH (ref 11.5–15.5)
WBC: 6.8 K/uL (ref 4.0–10.5)
nRBC: 0 % (ref 0.0–0.2)

## 2023-11-19 LAB — COMPREHENSIVE METABOLIC PANEL WITH GFR
ALT: 16 U/L (ref 0–44)
AST: 32 U/L (ref 15–41)
Albumin: 3.3 g/dL — ABNORMAL LOW (ref 3.5–5.0)
Alkaline Phosphatase: 65 U/L (ref 38–126)
Anion gap: 12 (ref 5–15)
BUN: 8 mg/dL (ref 6–20)
CO2: 25 mmol/L (ref 22–32)
Calcium: 8.9 mg/dL (ref 8.9–10.3)
Chloride: 98 mmol/L (ref 98–111)
Creatinine, Ser: 0.54 mg/dL — ABNORMAL LOW (ref 0.61–1.24)
GFR, Estimated: >60 mL/min (ref >60)
Glucose, Bld: 91 mg/dL (ref 70–99)
Potassium: 3.4 mmol/L — ABNORMAL LOW (ref 3.5–5.1)
Sodium: 135 mmol/L (ref 135–145)
Total Bilirubin: 0.3 mg/dL (ref 0.0–1.2)
Total Protein: 8.2 g/dL — ABNORMAL HIGH (ref 6.5–8.1)

## 2023-11-19 LAB — I-STAT CG4 LACTIC ACID, ED: Lactic Acid, Venous: 1.6 mmol/L (ref 0.5–1.9)

## 2023-11-19 LAB — BRAIN NATRIURETIC PEPTIDE: B Natriuretic Peptide: 82.8 pg/mL (ref 0.0–100.0)

## 2023-11-19 MED ORDER — FENTANYL CITRATE (PF) 50 MCG/ML IJ SOSY
50.0000 ug | PREFILLED_SYRINGE | Freq: Once | INTRAMUSCULAR | Status: DC
Start: 2023-11-19 — End: 2023-11-19
  Filled 2023-11-19: qty 1

## 2023-11-19 MED ORDER — IPRATROPIUM-ALBUTEROL 0.5-2.5 (3) MG/3ML IN SOLN: 3.0000 mL | Freq: Four times a day (QID) | RESPIRATORY_TRACT | 12 refills | Status: AC

## 2023-11-19 MED ORDER — FENTANYL CITRATE (PF) 50 MCG/ML IJ SOSY
50.0000 ug | PREFILLED_SYRINGE | Freq: Once | INTRAMUSCULAR | Status: DC
  Filled 2023-11-19: qty 1

## 2023-11-19 MED ORDER — IOHEXOL 350 MG/ML SOLN
75.0000 mL | Freq: Once | INTRAVENOUS | Status: AC | PRN
  Administered 2023-11-19: 75 mL via INTRAVENOUS

## 2023-11-19 MED ORDER — SODIUM CHLORIDE 3 % IN NEBU: INHALATION_SOLUTION | Freq: Two times a day (BID) | RESPIRATORY_TRACT | 12 refills | Status: DC

## 2023-11-19 MED ORDER — FENTANYL CITRATE (PF) 50 MCG/ML IJ SOSY
50.0000 ug | PREFILLED_SYRINGE | Freq: Once | INTRAMUSCULAR | Status: AC
  Administered 2023-11-19: 50 ug via INTRAVENOUS

## 2023-11-19 MED ORDER — POTASSIUM CHLORIDE 20 MEQ PO PACK: 80.0000 meq | PACK | Freq: Every day | ORAL | Status: DC

## 2023-11-19 MED ORDER — SODIUM CHLORIDE 0.9 % IV BOLUS
1000.0000 mL | Freq: Once | INTRAVENOUS | Status: AC
  Administered 2023-11-19: 1000 mL via INTRAVENOUS

## 2023-11-19 MED ORDER — SODIUM CHLORIDE 3 % IN NEBU: INHALATION_SOLUTION | RESPIRATORY_TRACT | 12 refills | Status: DC | PRN

## 2023-11-19 MED ORDER — AMOXICILLIN-POT CLAVULANATE 875-125 MG PO TABS: 1.0000 | ORAL_TABLET | Freq: Two times a day (BID) | ORAL | 0 refills | Status: DC

## 2023-11-19 NOTE — ED Triage Notes (Signed)
 Shortness of breath that has been getting worse since yesterday morning.   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   Mom says they have been struggling with failure to thrive for last couple weeks and PCP has been trying to get him to go to hospital.

## 2023-11-19 NOTE — Discharge Instructions (Addendum)
 Follow up with Pulmonology as scheduled.  Return if any problems.

## 2023-11-19 NOTE — ED Provider Notes (Signed)
 Pulmonology consulted to evaluate patient.  Pulmonology advised Augmentin  875 x 10 days, hypertonic saline nebs. Pt will be scheduled for outpatient follow up in Pulmonary clinic     Elainna Eshleman K, PA-C 11/19/23 0756    Mesner, Selinda, MD 11/20/23 [phone]

## 2023-11-19 NOTE — Consult Note (Addendum)
 NAME:  David [name], MRN:  [id number], DOB:  10/21/1975, LOS: 0 ADMISSION DATE:  11/19/2023, CONSULTATION DATE: 10/22 REFERRING MD:  Flint RIGGERS, CHIEF COMPLAINT:  PNA    History of Present Illness:  This is a 48 year old male patient well-known to me.  I follow him in the tracheostomy clinic for tracheostomy management.  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  Very common to present with what ever he had most recently drank or eaten notable in his tracheostomy. I last saw him in clinic approximately 1 month ago.  I again was advising him that he is hospice appropriate, and this needs to be something we consider he has been reluctant to utilize this service.  Also have recommended that he consider to move into with his mother to assist with his care as she provides care for him daily as it is, once again he is been reluctant to do this.  Review of progress notes since my last visit I see he has also been in the emergency room on 10/13 for poor appetite, nausea and vomiting. Presented to the emergency room today w/ 1 d h/o SOB. No CP, sick exposure, + cough thick secretions (this is really chronic), dec'd appetitive.  In ER->afebrile. No leukocytosis, Not hypoxic.  CT negative for PE. Multiple peribronchiovascular nodules, patchy airspace disease subpleural consolidation RUL scattered areas of scarring We were called to comment on CT findings and make recommendation  Pertinent  Medical History  Head and neck  cancer w/ radiation therapy, severe radio osteonecrosis, torticollis w/ severe contracture of neck. Dysphagia, aspiration, BTX, wt loss w. Severe protein calorie malnutrition and FTT, chronic pain, narcotic dependence, emphysema, tobacco abuse, recent hip fracture,  Significant Hospital Events: Including procedures, antibiotic start and stop dates in addition to other pertinent events   NA  Interim History / Subjective:  Feels a little better since arrival to ER   Objective    Blood pressure (!) 151/92, pulse 80, temperature 98.2 F (36.8 C), temperature source Oral, resp. rate (!) 21, height 5' 10 (1.778 m), weight 34.9 kg, SpO2 100%.        Intake/Output Summary (Last 24 hours) at 11/19/2023 9192 Last data filed at 11/19/2023 9353 Gross per 24 hour  Intake 1000 ml  Output --  Net 1000 ml   Filed Weights   11/19/23 0136  Weight: 34.9 kg    Examination: General: chronically ill cachectic white male lying in bed currently in no acute distress he has marked temporal wasting chronic torticollis tracheostomy appears in its usual state HENT: Marked head contraction with his Right ear essentially touching his right shoulder.  Temporal wasting.  Poor dentition.  Size 7 Portex, cuffless.  This is midline.  Has thick discolored mucus in the tracheostomy, appearing like food contamination Pulmonary crackles both bases currently on humidified room air.  CT chest showing chronic right upper lobe pleural-based scarring severe bullous emphysematous changes particularly on the right with bibasilar bronchiectasis, scattered right greater than left ground glass changes. Cardiac regular rate and rhythm Abdomen soft nontender  Extremities warm dry no edema Neuro baseline.  No focal deficits, Resolved problem list   Assessment and Plan  Shortness of breath, acute onset of dyspnea Aspiration pneumonia History of head and neck cancer Severe radiosteonecrosis Torticollis with neck  contracture Dysphagia with recurrent aspiration Bronchiectasis Emphysema Tobacco abuse Chronic pain with narcotic dependence Severe protein calorie malnutrition History of falls Failure to thrive Medical noncompliance Severe peridental disease DNR status  Pulmonary problem list Acute dyspnea secondary to recurrent aspiration pneumonia in a patient with known dysphagia, and medical noncompliance with recommended dietary restrictions, further complicated by underlying emphysema and ongoing tobacco use  Discussion David [name] is well-known to me.  He has had evidence of obvious upper airway penetration and recurrent aspiration for going on approximately 6 months now often seeing contamination from food or drink in his trachea.  Recommendations have been made repeatedly in regards to dysphagia precautions utilizing thickened liquid, and sitting up while eating.  He rarely adheres to any of these precautions.  His mother often needs to sneak thickener into his drinks, presenting today not surprisingly with aspiration pneumonia.  CT demonstrating chronic bronchiectatic changes.  I have been very straightforward with both David [name] and his mother.  I am actually surprised he is still alive.  I had recommended hospice over a year ago, he seemed to rally over 6 months, however over the last 6 months he has declined once again.  I continue to recommend hospice for which he he continues to decline.  Additionally he continues to decline moving in with his mother, who is already providing much of his care.  I do think this is only a matter of time before he dies of an aspiration pneumonia particularly with his ongoing noncompliance.  I have been very straightforward with both he and his mother about this.  We have discussed advanced directives in the past.  David [name] is appropriately a DNR DNI.  I see him essentially monthly, and continue to encourage transition to hospice care.  Plan For now would send him home on a  10-day course of Augmentin  for aspiration I have once again encouraged him to follow his aspiration precaution recommendations including thickened liquids and maintaining a head of bed up positioning while eating and after eating He does have a component of mucous plugging, for this I am recommending twice daily hypertonic nebulized saline, as well as scheduled bronchodilators to assist with the potential bronchospasm associated with the hypertonic saline I will see him in my clinic in 7 days for follow-up I discussed this plan with both David [name] and his mother who are in agreement with plan as outlined  Labs   CBC: Recent Labs  Lab 11/19/23 0351  WBC 6.8  NEUTROABS 5.8  HGB 10.3*  HCT 33.8*  MCV 86.4  PLT 246    Basic Metabolic Panel: Recent Labs  Lab 11/19/23 0351  NA 135  K 3.4*  CL 98  CO2 25  GLUCOSE 91  BUN 8  CREATININE 0.54*  CALCIUM 8.9   GFR: Estimated Creatinine Clearance: 55.1 mL/min (A) (by C-G formula based on SCr of 0.54 mg/dL (L)). Recent Labs  Lab 11/19/23 0351 11/19/23 0359  WBC 6.8  --   LATICACIDVEN  --  1.6    Liver Function Tests: Recent Labs  Lab 11/19/23 0351  AST 32  ALT 16  ALKPHOS 65  BILITOT 0.3  PROT 8.2*  ALBUMIN  3.3*   No results for input(s): LIPASE, AMYLASE in the last 168 hours. No results for  input(s): AMMONIA in the last 168 hours.  ABG    Component Value Date/Time   PHART 7.394 06/06/2021 2326   PCO2ART 66.3 (HH) 06/06/2021 2326   PO2ART 216 (H) 06/06/2021 2326   HCO3 28.9 (H) 10/11/2023 1112   TCO2 30 10/11/2023 1112   O2SAT 82 10/11/2023 1112     Coagulation Profile: No results for input(s): INR, PROTIME in the last 168 hours.  Cardiac Enzymes: No results for input(s): CKTOTAL, CKMB, CKMBINDEX, TROPONINI in the last 168 hours.  HbA1C: No results found for: HGBA1C  CBG: No results for input(s): GLUCAP in the last 168 hours.  Review of Systems:   Review of Systems  Constitutional:   Positive for malaise/fatigue and weight loss. Negative for fever.  HENT: Negative.    Eyes: Negative.   Respiratory:  Positive for cough, sputum production and shortness of breath.   Cardiovascular: Negative.   Gastrointestinal:  Positive for nausea.  Genitourinary: Negative.   Musculoskeletal: Negative.   Skin: Negative.   Neurological:  Positive for dizziness.  Endo/Heme/Allergies: Negative.   Psychiatric/Behavioral: Negative.       Past Medical History:  He,  has a past medical history of Acute respiratory failure with hypoxia (HCC), AKI (acute kidney injury) (01/08/2021), Aspiration pneumonia of right lower lobe (HCC) (06/06/2021), Bacterial endocarditis, Chronic HFrEF (heart failure with reduced ejection fraction) (HCC) (06/09/2021), Community acquired pneumonia due to Pneumococcus (04/01/2021), Evaluation by medical service required (03/19/2022), 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HCC), Leaking PEG tube (HCC) (12/12/2021), Malignant neoplasm of overlapping sites of larynx [facility]), Pneumococcal bacteremia (06/09/2021), Pneumothorax, and S/P percutaneous endoscopic gastrostomy (PEG) tube placement (HCC) (06/24/2021).   Surgical History:   Past Surgical History:  Procedure Laterality Date   chest tubes Left    IR GASTROSTOMY TUBE MOD SED  06/18/2021   LAPAROSCOPIC INSERTION GASTROSTOMY TUBE N/A 06/20/2021   Procedure: LAPAROSCOPIC INSERTION GASTROSTOMY TUBE;  Surgeon: Signe Mitzie LABOR, MD;  Location: [facility] OR;  Service: General;  Laterality: N/A;  DOW PATIENT   TOTAL HIP ARTHROPLASTY Right 09/17/2023   Procedure: ARTHROPLASTY, HIP, TOTAL, ANTERIOR APPROACH;  Surgeon: Kendal Franky SQUIBB, MD;  Location: [facility] OR;  Service: Orthopedics;  Laterality: Right;   TRACHEOSTOMY TUBE PLACEMENT N/A 06/09/2021   Procedure: AWAKE TRACHEOSTOMY;  Surgeon: Carlie Clark, MD;  Location: [facility] OR;  Service: ENT;  Laterality: N/A;     Social History:   reports that he has been smoking cigarettes. He has never used smokeless tobacco.  He reports that he does not currently use alcohol after a past usage of about 12.0 standard drinks of alcohol per week. He reports that he does not currently use drugs after having used the following drugs: IV.   Family History:  His family history is not on file.   Allergies No Known Allergies   Home Medications  Prior to Admission medications   Medication Sig Start Date End Date Taking? Authorizing Provider  amoxicillin -clavulanate (AUGMENTIN ) 875-125 MG tablet Take 1 tablet by mouth 2 (two) times daily. 11/19/23  Yes Flint Raring K, PA-C  acetaminophen  (TYLENOL ) 325 MG tablet Take 2 tablets (650 mg total) by mouth 3 (three) times daily. 09/19/23   Charmayne Holmes, DO  apixaban  (ELIQUIS ) 5 MG TABS tablet Take 1 tablet (5 mg total) by mouth 2 (two) times daily. 09/20/23   Charmayne Holmes, DO  chlorhexidine  (HIBICLENS ) 4 % external liquid Apply 15 mLs (1 Application total) topically as directed for 30 doses. Use as directed daily for 5 days every other week for 6 weeks. 09/17/23  Danton Lauraine LABOR, PA-C  diazePAM  5 MG/5ML SOLN Take 3 mLs (3 mg total) by mouth every 8 (eight) hours as needed. TAKE 3 MLS (3 MG TOTAL) BY MOUTH EVERY 8 (EIGHT) HOURS AS NEEDED FOR MUSCLE SPASMS (DYSPHAGIA). 10/28/23   Pickenpack-Cousar, Fannie SAILOR, NP  ferrous sulfate  300 (60 Fe) MG/5ML syrup TAKE 5 MLS (300 MG TOTAL) BY MOUTH DAILY. 10/20/23 01/18/24  Kandis Perkins, DO  fluconazole  (DIFLUCAN ) 200 MG tablet Take 2 tablets (400 mg total) by mouth daily. 10/28/23   D'Mello, Rosalyn, DO  lidocaine  (XYLOCAINE ) 2 % solution Use as directed 15 mLs in the mouth or throat every 6 (six) hours as needed for mouth pain. Mix 1part 2% viscous lidocaine , 1part water . Swish and spit 30mL of total diluted mixture up to eight times a day, prn as needed for soreness 11/04/23   Pickenpack-Cousar, Fannie SAILOR, NP  methadone  (DOLOPHINE ) 10 MG/ML solution Take 130 mg by mouth every 8 (eight) hours.    [provider]  methocarbamol  (ROBAXIN )  500 MG tablet Take 1 tablet (500 mg total) by mouth every 6 (six) hours as needed for muscle spasms. 09/19/23   Lovie Clarity, MD  Misc. Devices (ROLLATOR ULTRA-LIGHT) MISC Use as needed 09/19/23   Charmayne Holmes, DO  Multiple Vitamin (MULTIVITAMIN WITH MINERALS) TABS tablet Take 1 tablet by mouth daily. 09/20/23   Charmayne Holmes, DO  ondansetron  (ZOFRAN -ODT) 4 MG disintegrating tablet Take 1 tablet (4 mg total) by mouth every 8 (eight) hours as needed for nausea or vomiting. 11/10/23   Zelaya, Oscar A, PA-C  Pregabalin  20 MG/ML SOLN Take 50 mg by mouth 3 (three) times daily. 11/10/23   Zelaya, Oscar A, PA-C  sertraline  (ZOLOFT ) 20 MG/ML concentrated solution MIX 1.25 MLS (25 MG TOTAL) WITH 1/2 CUP OF WATER  AND TAKE BY MOUTH DAILY. 07/21/23   Kandis Perkins, DO  sodium chloride  HYPERTONIC 3 % nebulizer solution Take by nebulization as needed for other. 11/19/23   Flint Sonny POUR, PA-C  Vitamin D , Ergocalciferol , (DRISDOL ) 1.25 MG (50000 UNIT) CAPS capsule Take 1 capsule (50,000 Units total) by mouth every 7 (seven) days. 09/23/23   Lovie Clarity, MD     Critical care time: My time 48 min included: review of most recent records, direct face to face time obtaining history, performing physical exam, developing and documenting plan as well as discussing this plan with the patient and/or care givers.

## 2023-11-19 NOTE — ED Provider Notes (Signed)
 [place] EMERGENCY DEPARTMENT AT [facility] Provider Note   CSN: [id number] Arrival date & time: 11/19/23  0127     Patient presents with: Shortness of Breath   David [name] is a 48 y.o. male with history of laryngeal cancer, status post tracheostomy, radiation caries, chronic apical periodontitis, protein calorie malnutrition severe, weight loss unintentional, heart failure with reduced ejection fraction, back pain, tobacco use disorder, bronchiectasis, advance care planning, status post right hip fracture, trach dependent.  Presents to ED complaining of shortness of breath.  States he has been short of breath all day.  Smells very heavily of tobacco, reports that he was able to smoke a cigarette earlier this evening.  Denies chest pain, leg swelling, lightheadedness, dizziness, weakness.  Endorsing nausea without vomiting.  Denies abdominal pain.  States that he cannot sleep, cannot eat, cannot breathe.  Denies pain in his chest.  Denies fevers at home.  Patient here with mother who reports he lives at home alone and she is always there.   Shortness of Breath      Prior to Admission medications   Medication Sig Start Date End Date Taking? Authorizing Provider  acetaminophen  (TYLENOL ) 325 MG tablet Take 2 tablets (650 mg total) by mouth 3 (three) times daily. 09/19/23   Charmayne Holmes, DO  apixaban  (ELIQUIS ) 5 MG TABS tablet Take 1 tablet (5 mg total) by mouth 2 (two) times daily. 09/20/23   Charmayne Holmes, DO  chlorhexidine  (HIBICLENS ) 4 % external liquid Apply 15 mLs (1 Application total) topically as directed for 30 doses. Use as directed daily for 5 days every other week for 6 weeks. 09/17/23   Danton Lauraine LABOR, PA-C  diazePAM  5 MG/5ML SOLN Take 3 mLs (3 mg total) by mouth every 8 (eight) hours as needed. TAKE 3 MLS (3 MG TOTAL) BY MOUTH EVERY 8 (EIGHT) HOURS AS NEEDED FOR MUSCLE SPASMS (DYSPHAGIA). 10/28/23   Pickenpack-Cousar, Fannie SAILOR, NP  ferrous sulfate  300 (60 Fe)  MG/5ML syrup TAKE 5 MLS (300 MG TOTAL) BY MOUTH DAILY. 10/20/23 01/18/24  Kandis Perkins, DO  fluconazole  (DIFLUCAN ) 200 MG tablet Take 2 tablets (400 mg total) by mouth daily. 10/28/23   D'Mello, Rosalyn, DO  lidocaine  (XYLOCAINE ) 2 % solution Use as directed 15 mLs in the mouth or throat every 6 (six) hours as needed for mouth pain. Mix 1part 2% viscous lidocaine , 1part water . Swish and spit 30mL of total diluted mixture up to eight times a day, prn as needed for soreness 11/04/23   Pickenpack-Cousar, Fannie SAILOR, NP  methadone  (DOLOPHINE ) 10 MG/ML solution Take 130 mg by mouth every 8 (eight) hours.    [provider]  methocarbamol  (ROBAXIN ) 500 MG tablet Take 1 tablet (500 mg total) by mouth every 6 (six) hours as needed for muscle spasms. 09/19/23   Lovie Clarity, MD  Misc. Devices (ROLLATOR ULTRA-LIGHT) MISC Use as needed 09/19/23   Charmayne Holmes, DO  Multiple Vitamin (MULTIVITAMIN WITH MINERALS) TABS tablet Take 1 tablet by mouth daily. 09/20/23   Charmayne Holmes, DO  ondansetron  (ZOFRAN -ODT) 4 MG disintegrating tablet Take 1 tablet (4 mg total) by mouth every 8 (eight) hours as needed for nausea or vomiting. 11/10/23   Zelaya, Oscar A, PA-C  Pregabalin  20 MG/ML SOLN Take 50 mg by mouth 3 (three) times daily. 11/10/23   Zelaya, Oscar A, PA-C  sertraline  (ZOLOFT ) 20 MG/ML concentrated solution MIX 1.25 MLS (25 MG TOTAL) WITH 1/2 CUP OF WATER  AND TAKE BY MOUTH DAILY. 07/21/23   Bender,  Damien, DO  sodium chloride  HYPERTONIC 3 % nebulizer solution Take by nebulization as needed for other. 10/11/23   Robinson, John K, PA-C  Vitamin D , Ergocalciferol , (DRISDOL ) 1.25 MG (50000 UNIT) CAPS capsule Take 1 capsule (50,000 Units total) by mouth every 7 (seven) days. 09/23/23   Lovie Clarity, MD    Allergies: Patient has no known allergies.    Review of Systems  Respiratory:  Positive for shortness of breath.   All other systems reviewed and are negative.   Updated Vital Signs BP (!) 137/96   Pulse 82    Temp 98.6 F (37 C)   Resp 13   Ht 5' 10 (1.778 m)   Wt 34.9 kg   SpO2 100%   BMI 11.05 kg/m   Physical Exam Vitals and nursing note reviewed.  Constitutional:      General: He is not in acute distress.    Appearance: He is well-developed.  HENT:     Head: Normocephalic and atraumatic.  Eyes:     Conjunctiva/sclera: Conjunctivae normal.  Neck:     Comments: Trach present without surrounding signs of infection Cardiovascular:     Rate and Rhythm: Normal rate and regular rhythm.     Heart sounds: No murmur heard. Pulmonary:     Effort: Pulmonary effort is normal. No respiratory distress.     Breath sounds: Normal breath sounds.     Comments: Lung sounds clear to auscultation bilaterally Abdominal:     Palpations: Abdomen is soft.     Tenderness: There is no abdominal tenderness.  Musculoskeletal:        General: No swelling.     Cervical back: Neck supple.     Right lower leg: No edema.     Left lower leg: No edema.  Skin:    General: Skin is warm and dry.     Capillary Refill: Capillary refill takes less than 2 seconds.  Neurological:     Mental Status: He is alert.  Psychiatric:        Mood and Affect: Mood normal.     (all labs ordered are listed, but only abnormal results are displayed) Labs Reviewed  CBC WITH DIFFERENTIAL/PLATELET - Abnormal; Notable for the following components:      Result Value   RBC 3.91 (*)    Hemoglobin 10.3 (*)    HCT 33.8 (*)    RDW 16.4 (*)    Lymphs Abs 0.5 (*)    All other components within normal limits  COMPREHENSIVE METABOLIC PANEL WITH GFR - Abnormal; Notable for the following components:   Potassium 3.4 (*)    Creatinine, Ser 0.54 (*)    Total Protein 8.2 (*)    Albumin  3.3 (*)    All other components within normal limits  BRAIN NATRIURETIC PEPTIDE  I-STAT CG4 LACTIC ACID, ED    EKG: None  Radiology: CT Angio Chest PE W and/or Wo Contrast Result Date: 11/19/2023 EXAM: CTA of the Chest with contrast for PE  11/19/2023 05:21:25 AM TECHNIQUE: CTA of the chest was performed without and with the administration of intravenous contrast (75 mL iohexol  (OMNIPAQUE ) 350 MG/ML injection). Multiplanar reformatted images are provided for review. MIP images are provided for review. Automated exposure control, iterative reconstruction, and/or weight based adjustment of the mA/kV was utilized to reduce the radiation dose to as low as reasonably achievable. COMPARISON: 10/11/2023 CLINICAL HISTORY: Pulmonary embolism (PE) suspected, high prob. FINDINGS: PULMONARY ARTERIES: Pulmonary arteries are adequately opacified for evaluation. No pulmonary embolism. Main  pulmonary artery is normal in caliber. MEDIASTINUM: The heart and pericardium demonstrate no acute abnormality. There is no acute abnormality of the thoracic aorta. Aortic atherosclerotic calcifications. Tracheostomy tube is in place with tip above the carina. LYMPH NODES: No mediastinal, hilar or axillary lymphadenopathy. LUNGS AND PLEURA: Emphysema with diffuse bronchial wall thickening. Scattered areas of scarring with architectural distortion compatible with postinflammatory change. Multifocal patchy areas of ground glass attenuation noted within the upper and lower lung zones. Within the right upper lobe there is focal area of subpleural consolidation and pleural thickening which appears similar to the exam from 10/11/2023. Multiple peribronchovascular nodules are scattered throughout the left lower lobe which has an appearance similar to the previous exam and is likely the sequelae of recurrent aspiration versus atypical infection. Signs of previous wedge resection from the anteromedial left upper lobe. No pleural effusion or pneumothorax. UPPER ABDOMEN: No acute abnormality within the imaged portions of the upper abdomen. SOFT TISSUES AND BONES: Nonunion fracture deformity involving the proximal body of the sternum is unchanged in appearance from the previous exam coronal  image 104/8. No acute soft tissue abnormality. IMPRESSION: 1. No evidence of pulmonary embolism. 2. Multiple peribronchovascular nodules and patchy areas of ground glass attention noted, likely sequelae of recurrent aspiration versus atypical infection, similar to prior exam. 3. Focal subpleural consolidation and pleural thickening in the right upper lobe, similar to prior exam. 4. Scattered areas of scarring, architectural distortion, and volume loss within the right lung compatible with sequelae of chronic inflammation or infection Electronically signed by: Waddell Calk MD 11/19/2023 05:40 AM EDT RP Workstation: [id number]   DG Chest Port 1 View Result Date: 11/19/2023 CLINICAL DATA:  Shortness of breath EXAM: PORTABLE CHEST 1 VIEW COMPARISON:  11/10/2023 FINDINGS: Cardiac shadow is within normal limits. Tracheostomy tube is noted. Patient is rotated to the right accentuating the mediastinal markings. No focal infiltrate is seen. Some chronic scarring is noted stable from the prior study. IMPRESSION: No acute abnormality noted. Electronically Signed   By: Oneil Devonshire M.D.   On: 11/19/2023 02:19    Procedures   Medications Ordered in the ED  potassium chloride  (KLOR-CON ) packet 80 mEq (has no administration in time range)  sodium chloride  0.9 % bolus 1,000 mL (1,000 mLs Intravenous New Bag/Given 11/19/23 0358)  fentaNYL  (SUBLIMAZE ) injection 50 mcg (50 mcg Intravenous Given 11/19/23 0355)  iohexol  (OMNIPAQUE ) 350 MG/ML injection 75 mL (75 mLs Intravenous Contrast Given 11/19/23 0522)    Clinical Course as of 11/19/23 9361  Wed Nov 19, 2023  0345 Reports after having deep suctioning, he did have an initial episode where his shortness of breath improved however states that he return to baseline shortness of breath after this.  He is very upset because he has not received IV pain medication.  He reports that he feels as if the nursing staff did not fully attempt to place IV on him. [CG]  [id number]  Patient now up, moving about the room.  Requesting food.  States shortness breath is improving. [CG]    Clinical Course User Index [CG] Ruthell Lonni FALCON, PA-C    Medical Decision Making Amount and/or Complexity of Data Reviewed Labs: ordered. Radiology: ordered.  Risk Prescription drug management.   This is a 48 year old trach dependent patient presenting to the ED due to concern of shortness of breath.  On exam the patient is afebrile and nontachycardic.  His lung sounds are clear bilaterally, he is not hypoxic with oxygen  saturation 100% room air.  Abdomen  soft and compressible.  Neuroexam at baseline.  No edema to bilateral lower extremities.  Assessed utilizing CBC, CMP, BNP, lactic acid, chest x-ray, CT angio chest, EKG.  Given fentanyl  for pain, 1 L of fluid.  She was called at the bedside and patient had deep suctioning performed.  He reports that after deep suctioning he did have resolution of shortness of breath.  Patient CBC here was without leukocytosis, baseline hemoglobin.  Metabolic panel potassium 3.4 repleted with 80 mEq oral potassium packet, no other electrolyte derangement, no elevated creatinine, anion gap 12.  BNP not elevated 82.8.  Chest x-ray unremarkable.  Lactic acid not elevated at 1.6.  CTA obtained.  CTA shows no PE.  Does show multiple peribronchovascular nodules and patchy areas of ground glass attenuation noted, likely sequelae of recurrent aspiration versus atypical infection which is similar to prior exam.  Patient also noted to have focal subpleural consolidation and pleural thickening in the right upper lobe which is similar to prior exam.  Also noted to have scattered areas of scarring, architectural distortion, and volume loss within the right lung compatible with sequelae of chronic inflammation or infection.  Patient denies any nausea or vomiting this evening.  Will consult pulmonology in reference to the CTA findings.  The patient has been  noted to be up, ambulating throughout the room in department.  Patient has requested food and is eating food resting comfortably at this time.  At end of shift, still have not discussed patient with pulmonology. Patient signed out to oncoming team pending consultation. Most likely patient in need of antibiotics on discharge secondary to concern of aspiration. Patient also will need to see pulmonology in follow up. Signed out to oncoming team pending consultation.      Final diagnoses:  Shortness of breath    ED Discharge Orders     None          [place], Divelbiss 11/19/23 9359    Lorette Mayo, MD 11/19/23 [phone]

## 2023-11-20 ENCOUNTER — Encounter (HOSPITAL_COMMUNITY): Payer: Self-pay | Admitting: Emergency Medicine

## 2023-11-20 ENCOUNTER — Inpatient Hospital Stay (HOSPITAL_COMMUNITY)
Admission: EM | Admit: 2023-11-20 | Discharge: 2023-11-24 | DRG: 177 | Disposition: A | Discharge status: Home / Self Care | Payer: MEDICAID

## 2023-11-20 ENCOUNTER — Other Ambulatory Visit: Payer: Self-pay

## 2023-11-20 ENCOUNTER — Emergency Department (HOSPITAL_COMMUNITY): Payer: MEDICAID

## 2023-11-20 DIAGNOSIS — J9601 Acute respiratory failure with hypoxia: Secondary | ICD-10-CM | POA: Diagnosis not present

## 2023-11-20 DIAGNOSIS — R059 Cough, unspecified: Secondary | ICD-10-CM

## 2023-11-20 DIAGNOSIS — Z515 Encounter for palliative care: Secondary | ICD-10-CM

## 2023-11-20 DIAGNOSIS — R1310 Dysphagia, unspecified: Secondary | ICD-10-CM | POA: Diagnosis present

## 2023-11-20 DIAGNOSIS — Z1152 Encounter for screening for COVID-19: Secondary | ICD-10-CM

## 2023-11-20 DIAGNOSIS — R0602 Shortness of breath: Secondary | ICD-10-CM

## 2023-11-20 DIAGNOSIS — R1319 Other dysphagia: Secondary | ICD-10-CM | POA: Diagnosis not present

## 2023-11-20 DIAGNOSIS — Z86718 Personal history of other venous thrombosis and embolism: Secondary | ICD-10-CM

## 2023-11-20 DIAGNOSIS — C329 Malignant neoplasm of larynx, unspecified: Secondary | ICD-10-CM | POA: Diagnosis present

## 2023-11-20 DIAGNOSIS — Z8521 Personal history of malignant neoplasm of larynx: Secondary | ICD-10-CM

## 2023-11-20 DIAGNOSIS — Z96641 Presence of right artificial hip joint: Secondary | ICD-10-CM | POA: Diagnosis present

## 2023-11-20 DIAGNOSIS — Z720 Tobacco use: Secondary | ICD-10-CM | POA: Diagnosis present

## 2023-11-20 DIAGNOSIS — G894 Chronic pain syndrome: Secondary | ICD-10-CM | POA: Diagnosis not present

## 2023-11-20 DIAGNOSIS — R5383 Other fatigue: Secondary | ICD-10-CM

## 2023-11-20 DIAGNOSIS — Z79899 Other long term (current) drug therapy: Secondary | ICD-10-CM

## 2023-11-20 DIAGNOSIS — R627 Adult failure to thrive: Secondary | ICD-10-CM | POA: Diagnosis present

## 2023-11-20 DIAGNOSIS — Z930 Tracheostomy status: Secondary | ICD-10-CM

## 2023-11-20 DIAGNOSIS — Z7901 Long term (current) use of anticoagulants: Secondary | ICD-10-CM

## 2023-11-20 DIAGNOSIS — E872 Acidosis, unspecified: Principal | ICD-10-CM | POA: Diagnosis present

## 2023-11-20 DIAGNOSIS — R110 Nausea: Secondary | ICD-10-CM

## 2023-11-20 DIAGNOSIS — Z7189 Other specified counseling: Secondary | ICD-10-CM

## 2023-11-20 DIAGNOSIS — Z79891 Long term (current) use of opiate analgesic: Secondary | ICD-10-CM | POA: Diagnosis not present

## 2023-11-20 DIAGNOSIS — E860 Dehydration: Secondary | ICD-10-CM | POA: Diagnosis present

## 2023-11-20 DIAGNOSIS — Z9221 Personal history of antineoplastic chemotherapy: Secondary | ICD-10-CM

## 2023-11-20 DIAGNOSIS — E871 Hypo-osmolality and hyponatremia: Secondary | ICD-10-CM | POA: Diagnosis present

## 2023-11-20 DIAGNOSIS — J690 Pneumonitis due to inhalation of food and vomit: Principal | ICD-10-CM | POA: Diagnosis present

## 2023-11-20 DIAGNOSIS — J439 Emphysema, unspecified: Secondary | ICD-10-CM | POA: Diagnosis present

## 2023-11-20 DIAGNOSIS — R109 Unspecified abdominal pain: Secondary | ICD-10-CM | POA: Insufficient documentation

## 2023-11-20 DIAGNOSIS — Z923 Personal history of irradiation: Secondary | ICD-10-CM

## 2023-11-20 DIAGNOSIS — E43 Unspecified severe protein-calorie malnutrition: Secondary | ICD-10-CM | POA: Diagnosis present

## 2023-11-20 DIAGNOSIS — Z23 Encounter for immunization: Secondary | ICD-10-CM

## 2023-11-20 DIAGNOSIS — F17210 Nicotine dependence, cigarettes, uncomplicated: Secondary | ICD-10-CM | POA: Diagnosis present

## 2023-11-20 DIAGNOSIS — B376 Candidal endocarditis: Secondary | ICD-10-CM | POA: Diagnosis present

## 2023-11-20 DIAGNOSIS — I5032 Chronic diastolic (congestive) heart failure: Secondary | ICD-10-CM | POA: Diagnosis present

## 2023-11-20 DIAGNOSIS — Z681 Body mass index (BMI) 19 or less, adult: Secondary | ICD-10-CM

## 2023-11-20 DIAGNOSIS — R64 Cachexia: Secondary | ICD-10-CM | POA: Diagnosis present

## 2023-11-20 DIAGNOSIS — K567 Ileus, unspecified: Secondary | ICD-10-CM | POA: Diagnosis present

## 2023-11-20 LAB — URINALYSIS, W/ REFLEX TO CULTURE (INFECTION SUSPECTED)
Bacteria, UA: NONE SEEN
Bilirubin Urine: NEGATIVE
Glucose, UA: NEGATIVE mg/dL
Hgb urine dipstick: NEGATIVE
Ketones, ur: NEGATIVE mg/dL
Leukocytes,Ua: NEGATIVE
Nitrite: NEGATIVE
Protein, ur: NEGATIVE mg/dL
Specific Gravity, Urine: 1.009 (ref 1.005–1.030)
pH: 7 (ref 5.0–8.0)

## 2023-11-20 LAB — CBC
HCT: 31.4 % — ABNORMAL LOW (ref 39.0–52.0)
Hemoglobin: 9.7 g/dL — ABNORMAL LOW (ref 13.0–17.0)
MCH: 26.4 pg (ref 26.0–34.0)
MCHC: 30.9 g/dL (ref 30.0–36.0)
MCV: 85.3 fL (ref 80.0–100.0)
Platelets: 243 K/uL (ref 150–400)
RBC: 3.68 MIL/uL — ABNORMAL LOW (ref 4.22–5.81)
RDW: 16.5 % — ABNORMAL HIGH (ref 11.5–15.5)
WBC: 8.3 K/uL (ref 4.0–10.5)
nRBC: 0 % (ref 0.0–0.2)

## 2023-11-20 LAB — I-STAT CHEM 8, ED
BUN: 10 mg/dL (ref 6–20)
Calcium, Ion: 0.91 mmol/L — ABNORMAL LOW (ref 1.15–1.40)
Chloride: 101 mmol/L (ref 98–111)
Creatinine, Ser: 0.5 mg/dL — ABNORMAL LOW (ref 0.61–1.24)
Glucose, Bld: 82 mg/dL (ref 70–99)
HCT: 32 % — ABNORMAL LOW (ref 39.0–52.0)
Hemoglobin: 10.9 g/dL — ABNORMAL LOW (ref 13.0–17.0)
Potassium: 4.8 mmol/L (ref 3.5–5.1)
Sodium: 133 mmol/L — ABNORMAL LOW (ref 135–145)
TCO2: 24 mmol/L (ref 22–32)

## 2023-11-20 LAB — I-STAT CG4 LACTIC ACID, ED
Lactic Acid, Venous: 2.4 mmol/L (ref 0.5–1.9)
Lactic Acid, Venous: 1.6 mmol/L (ref 0.5–1.9)

## 2023-11-20 LAB — PROCALCITONIN: Procalcitonin: <0.1 ng/mL

## 2023-11-20 MED ORDER — FLUCONAZOLE 200 MG PO TABS
400.0000 mg | ORAL_TABLET | Freq: Every day | ORAL | Status: DC
  Administered 2023-11-20 – 2023-11-24 (×5): 400 mg via ORAL
  Filled 2023-11-20 (×6): qty 2

## 2023-11-20 MED ORDER — TRIMETHOBENZAMIDE HCL 100 MG/ML IM SOLN: 200.0000 mg | Freq: Three times a day (TID) | INTRAMUSCULAR | Status: DC | PRN

## 2023-11-20 MED ORDER — FENTANYL CITRATE (PF) 50 MCG/ML IJ SOSY
50.0000 ug | PREFILLED_SYRINGE | Freq: Once | INTRAMUSCULAR | Status: AC
  Administered 2023-11-20: 50 ug via INTRAVENOUS
  Filled 2023-11-20: qty 1

## 2023-11-20 MED ORDER — AMOXICILLIN-POT CLAVULANATE 875-125 MG PO TABS
1.0000 | ORAL_TABLET | Freq: Two times a day (BID) | ORAL | Status: DC
  Administered 2023-11-20: 1 via ORAL
  Filled 2023-11-20: qty 1

## 2023-11-20 MED ORDER — SODIUM CHLORIDE 0.9 % IV BOLUS
1000.0000 mL | Freq: Once | INTRAVENOUS | Status: AC
  Administered 2023-11-20: 1000 mL via INTRAVENOUS

## 2023-11-20 MED ORDER — ONDANSETRON HCL 4 MG/2ML IJ SOLN: 4.0000 mg | Freq: Once | INTRAMUSCULAR | Status: DC

## 2023-11-20 MED ORDER — DIAZEPAM 2 MG PO TABS
3.0000 mg | ORAL_TABLET | Freq: Three times a day (TID) | ORAL | Status: DC | PRN
  Administered 2023-11-21 – 2023-11-23 (×4): 3 mg via ORAL
  Filled 2023-11-20 (×4): qty 2

## 2023-11-20 MED ORDER — TRIMETHOBENZAMIDE HCL 100 MG/ML IM SOLN
200.0000 mg | Freq: Once | INTRAMUSCULAR | Status: AC
  Administered 2023-11-20: 200 mg via INTRAMUSCULAR
  Filled 2023-11-20 (×2): qty 2

## 2023-11-20 MED ORDER — POLYETHYLENE GLYCOL 3350 17 G PO PACK: 17.0000 g | PACK | Freq: Every day | ORAL | Status: DC | PRN

## 2023-11-20 MED ORDER — SERTRALINE HCL 25 MG PO TABS
25.0000 mg | ORAL_TABLET | Freq: Every day | ORAL | Status: DC
  Administered 2023-11-21 – 2023-11-24 (×4): 25 mg via ORAL
  Filled 2023-11-20 (×5): qty 1

## 2023-11-20 MED ORDER — HYDROMORPHONE HCL 1 MG/ML IJ SOLN
0.5000 mg | Freq: Once | INTRAMUSCULAR | Status: AC
  Administered 2023-11-20: 0.5 mg via INTRAVENOUS
  Filled 2023-11-20: qty 1

## 2023-11-20 MED ORDER — PREGABALIN 25 MG PO CAPS
50.0000 mg | ORAL_CAPSULE | Freq: Three times a day (TID) | ORAL | Status: DC
  Administered 2023-11-20 – 2023-11-24 (×10): 50 mg via ORAL
  Filled 2023-11-20 (×10): qty 2

## 2023-11-20 MED ORDER — IPRATROPIUM-ALBUTEROL 0.5-2.5 (3) MG/3ML IN SOLN
3.0000 mL | Freq: Four times a day (QID) | RESPIRATORY_TRACT | Status: DC
  Administered 2023-11-21 (×2): 3 mL via RESPIRATORY_TRACT
  Filled 2023-11-20 (×2): qty 3

## 2023-11-20 MED ORDER — ENOXAPARIN SODIUM 40 MG/0.4ML IJ SOSY: 40.0000 mg | PREFILLED_SYRINGE | INTRAMUSCULAR | Status: DC

## 2023-11-20 MED ORDER — ACETAMINOPHEN 650 MG RE SUPP: 650.0000 mg | Freq: Four times a day (QID) | RECTAL | Status: DC | PRN

## 2023-11-20 MED ORDER — APIXABAN 5 MG PO TABS
5.0000 mg | ORAL_TABLET | Freq: Two times a day (BID) | ORAL | Status: DC
  Administered 2023-11-20 – 2023-11-24 (×8): 5 mg via ORAL
  Filled 2023-11-20 (×8): qty 1

## 2023-11-20 MED ORDER — ACETAMINOPHEN 325 MG PO TABS
650.0000 mg | ORAL_TABLET | Freq: Four times a day (QID) | ORAL | Status: DC | PRN
  Administered 2023-11-21 – 2023-11-23 (×3): 650 mg via ORAL
  Filled 2023-11-20 (×4): qty 2

## 2023-11-20 MED ORDER — METHADONE HCL 10 MG PO TABS
130.0000 mg | ORAL_TABLET | Freq: Every day | ORAL | Status: DC
Start: 2023-11-21 — End: 2023-11-24
  Administered 2023-11-21 – 2023-11-24 (×4): 130 mg via ORAL
  Filled 2023-11-20 (×4): qty 13

## 2023-11-20 NOTE — ED Notes (Signed)
 Admitting MD at bedside.

## 2023-11-20 NOTE — H&P (Incomplete)
 Date: 11/20/2023               Patient Name:  David [name] MRN: [id number]  DOB: [birth year] Age / Sex: 48 y.o., male   PCP: Kandis Perkins, DO         Medical Service: Internal Medicine Teaching Service         Attending Physician: {[id number]}      First Contact: {[id number]}}    Second Contact: {[id number]}         Pager Information: First Contact Pager: [phone]   Second Contact Pager: [phone]   SUBJECTIVE   Chief Complaint: ***  History of Present Illness: David [name] is a 48 y.o. male with PMH of ***.   Presents with *** Endorses *** Denies ***  *** ED Course: Labs significant for *** Imaging *** Received *** Consulted ***  Past Medical History ***  Past Surgical History Past Surgical History:  Procedure Laterality Date   chest tubes Left    IR GASTROSTOMY TUBE MOD SED  06/18/2021   LAPAROSCOPIC INSERTION GASTROSTOMY TUBE N/A 06/20/2021   Procedure: LAPAROSCOPIC INSERTION GASTROSTOMY TUBE;  Surgeon: Signe Mitzie LABOR, MD;  Location: [facility] OR;  Service: General;  Laterality: N/A;  DOW PATIENT   TOTAL HIP ARTHROPLASTY Right 09/17/2023   Procedure: ARTHROPLASTY, HIP, TOTAL, ANTERIOR APPROACH;  Surgeon: Kendal Franky SQUIBB, MD;  Location: [facility] OR;  Service: Orthopedics;  Laterality: Right;   TRACHEOSTOMY TUBE PLACEMENT N/A 06/09/2021   Procedure: AWAKE TRACHEOSTOMY;  Surgeon: Carlie Clark, MD;  Location: [facility] OR;  Service: ENT;  Laterality: N/A;    Meds:  No outpatient medications have been marked as taking for the 11/20/23 encounter [facility] Encounter).    Social:  Lives With: Occupation: Support: Level of Function: PCP: PIERRETTE Kandis Perkins, DO  Substances: -Tobacco: *** -Alcohol: *** -Recreational Drug: ***  Family History: *** History reviewed. No pertinent family history.   Allergies: Allergies as of 11/20/2023   (No Known Allergies)    Review of Systems: A complete ROS was negative except as  per HPI.   OBJECTIVE:   Physical Exam: Blood pressure (!) 133/100, pulse (!) 52, temperature 98 F (36.7 C), resp. rate 17, SpO2 100%.  Constitutional: well-appearing *** sitting in ***, in no acute distress HENT: normocephalic atraumatic, mucous membranes moist Eyes: conjunctiva non-erythematous Neck: supple Cardiovascular: regular rate and rhythm, no m/r/g Pulmonary/Chest: normal work of breathing on room air, lungs clear to auscultation bilaterally Abdominal: soft, non-tender, non-distended MSK: normal bulk and tone Neurological: alert & oriented x 3, 5/5 strength in bilateral upper and lower extremities, normal gait Skin: warm and dry Psych: ***  Labs: CBC    Component Value Date/Time   WBC 8.3 11/20/2023 1550   RBC 3.68 (L) 11/20/2023 1550   HGB 10.9 (L) 11/20/2023 1736   HGB 9.1 (L) 10/10/2023 1140   HCT 32.0 (L) 11/20/2023 1736   HCT 29.7 (L) 10/10/2023 1140   PLT 243 11/20/2023 1550   PLT 203 10/10/2023 1140   MCV 85.3 11/20/2023 1550   MCV 87 10/10/2023 1140   MCH 26.4 11/20/2023 1550   MCHC 30.9 11/20/2023 1550   RDW 16.5 (H) 11/20/2023 1550   RDW 17.0 (H) 10/10/2023 1140   LYMPHSABS 0.5 (L) 11/19/2023 0351   MONOABS 0.4 11/19/2023 0351   EOSABS 0.1 11/19/2023 0351   BASOSABS 0.0 11/19/2023 0351     CMP     Component Value Date/Time   NA 133 (L) 11/20/2023 1736   NA 138 11/13/2022 1540   K  4.8 11/20/2023 1736   CL 101 11/20/2023 1736   CO2 25 11/19/2023 0351   GLUCOSE 82 11/20/2023 1736   BUN 10 11/20/2023 1736   BUN 11 11/13/2022 1540   CREATININE 0.50 (L) 11/20/2023 1736   CREATININE 0.66 11/08/2022 0859   CREATININE 0.78 10/24/2022 1451   CALCIUM 8.9 11/19/2023 0351   PROT 8.2 (H) 11/19/2023 0351   PROT 7.1 11/13/2022 1540   ALBUMIN  3.3 (L) 11/19/2023 0351   ALBUMIN  3.7 (L) 11/13/2022 1540   AST 32 11/19/2023 0351   AST 46 (H) 11/08/2022 0859   ALT 16 11/19/2023 0351   ALT 19 11/08/2022 0859   ALKPHOS 65 11/19/2023 0351   BILITOT 0.3  11/19/2023 0351   BILITOT <0.2 11/13/2022 1540   BILITOT 0.6 11/08/2022 0859   GFRNONAA >60 11/19/2023 0351   GFRNONAA >60 11/08/2022 0859   GFRAA  01/25/2008 2040    >60        The eGFR has been calculated using the MDRD equation. This calculation has not been validated in all clinical    Imaging: *** DG Chest Port 1 View Result Date: 11/20/2023 CLINICAL DATA:  Dyspnea 3 days intermittently. EXAM: PORTABLE CHEST 1 VIEW COMPARISON:  11/19/2023 and 11/10/2023 as well as chest CT 11/19/2023 FINDINGS: Tracheostomy tube in adequate position. Patient is rotated to the right. Lungs are adequately inflated. There is stable left apical pleural thickening. Mild interstitial prominence over the right upper lobe/apex unchanged. No focal lobar consolidation or effusion. Recently seen airspace process on CT not well visualized radiographically. Cardiomediastinal silhouette and remainder of the exam is unchanged. IMPRESSION: 1. No acute cardiopulmonary disease. 2. Stable left apical pleural thickening. Stable mild interstitial prominence over the right upper lobe/apex. Electronically Signed   By: Toribio Agreste M.D.   On: 11/20/2023 16:26   CT Angio Chest PE W and/or Wo Contrast Result Date: 11/19/2023 EXAM: CTA of the Chest with contrast for PE 11/19/2023 05:21:25 AM TECHNIQUE: CTA of the chest was performed without and with the administration of intravenous contrast (75 mL iohexol  (OMNIPAQUE ) 350 MG/ML injection). Multiplanar reformatted images are provided for review. MIP images are provided for review. Automated exposure control, iterative reconstruction, and/or weight based adjustment of the mA/kV was utilized to reduce the radiation dose to as low as reasonably achievable. COMPARISON: 10/11/2023 CLINICAL HISTORY: Pulmonary embolism (PE) suspected, high prob. FINDINGS: PULMONARY ARTERIES: Pulmonary arteries are adequately opacified for evaluation. No pulmonary embolism. Main pulmonary artery is normal in  caliber. MEDIASTINUM: The heart and pericardium demonstrate no acute abnormality. There is no acute abnormality of the thoracic aorta. Aortic atherosclerotic calcifications. Tracheostomy tube is in place with tip above the carina. LYMPH NODES: No mediastinal, hilar or axillary lymphadenopathy. LUNGS AND PLEURA: Emphysema with diffuse bronchial wall thickening. Scattered areas of scarring with architectural distortion compatible with postinflammatory change. Multifocal patchy areas of ground glass attenuation noted within the upper and lower lung zones. Within the right upper lobe there is focal area of subpleural consolidation and pleural thickening which appears similar to the exam from 10/11/2023. Multiple peribronchovascular nodules are scattered throughout the left lower lobe which has an appearance similar to the previous exam and is likely the sequelae of recurrent aspiration versus atypical infection. Signs of previous wedge resection from the anteromedial left upper lobe. No pleural effusion or pneumothorax. UPPER ABDOMEN: No acute abnormality within the imaged portions of the upper abdomen. 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 No acute soft tissue abnormality. IMPRESSION: 1. No evidence of pulmonary embolism. 2. Multiple peribronchovascular nodules and patchy areas of ground glass attention noted, likely sequelae of recurrent aspiration versus atypical infection, similar to prior exam. 3. Focal subpleural consolidation and pleural thickening in the right upper lobe, similar to prior exam. 4. Scattered areas of scarring, architectural distortion, and volume loss within the right lung compatible with sequelae of chronic inflammation or infection Electronically signed by: Waddell Calk MD 11/19/2023 05:40 AM EDT RP Workstation: [id number]   DG Chest Port 1 View Result Date:  11/19/2023 CLINICAL DATA:  Shortness of breath EXAM: PORTABLE CHEST 1 VIEW COMPARISON:  11/10/2023 FINDINGS: Cardiac shadow is within normal limits. Tracheostomy tube is noted. Patient is rotated to the right accentuating the mediastinal markings. No focal infiltrate is seen. Some chronic scarring is noted stable from the prior study. IMPRESSION: No acute abnormality noted. Electronically Signed   By: Oneil Devonshire M.D.   On: 11/19/2023 02:19     EKG: personally reviewed my interpretation is***. Prior EKG***  ASSESSMENT & PLAN:   Assessment & Plan by Problem: Active Problems:   * No active hospital problems. *   David [name] is a 48 y.o. person living with a history of *** who presented with *** and admitted for *** on hospital day 0 #failure to thrive #protein- calorie malnutrition, severe Patient is trach dependent and is at chronic risk of aspiration. Weight at office visit on 9/26 was 81 lbs and today is at 77lbs. Patient states that he *** eating.  - Registered dietitian consulted for further recommendations.    #advanced care planning Per last appointment with Dr.Zheng and noted in multiple notes, patient is chronically ill. Also ntoed to have severe protein- calorie malnutrition.  Advanced care planning was discussed heavily on 9/26 office visit at [facility]. Patient believes that he is eating more and will continue getting better per that note. Patient follows outpatient with palliative care. MOST form was deferred at that office visit. Needs more GOC of discussions during this hospitalization and MOST form should be discussed.   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 He has completed radiation therapy to the larynx on 02/09/2021, He follows with ENT at [facility] and follows with trach clinic.  Deep suctioning has been helping patient's breathing.  - RT following for  trach care - No acute concerns   #Chronic pain syndrome Patient follows palliative care for chronic pain.  He is currently on methadone  at home.  His current dose includes 120 mg of methadone  daily as well as pregabalin  50 mg 3 times daily. 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 He follows with Dr. Overton of ID.  Per his last note on 01/07/2023, patient to be on 400 mg of fluconazole  daily.  He is to continue this indefinitely.  There has been some charting inconsistency between 400 and 800 daily, confirmed with patient it is 400 mg daily. -Resume home fluconazole  400 mg daily   #History of right IJ vein occlusion Patient has a past medical history of right IJ vein occlusion.  This was diagnosed on admission in 09/04/2022.  Patient encouraged to be on Eliquis  indefinitely.  In anticipation of surgery, we  will hold Eliquis  and bridged with Lovenox . - Hold Eliquis  - Bridged with Lovenox     Best practice: Diet: {NAMES:3044014::Normal,Heart Healthy,Carb-Modified,Renal,Carb/Renal,NPO,TPN,Tube Feeds} VTE: {NAMES:3044014::Heparin ,Enoxaparin ,SCDs,DOAC,None} IVF: {NAMES:3044014::None,NS,1/2 NS,LR,D5,D10},{NAMES:3044014::None,10cc/hr,25cc/hr,50cc/hr,75cc/hr,100cc/hr,110cc/hr,125cc/hr,Bolus} Code: {NAMES:3044014::Full,DNR,DNI,DNR/DNI,Comfort Care,Unknown}  Disposition planning: Prior to Admission Living Arrangement: {NAMES:3044014::Home, living ***,SNF, ***,Homeless,***} Anticipated Discharge Location: {NAMES:3044014::Home,SNF,CIR,***} Barriers to Discharge: ***  Dispo: Admit patient to {STATUS:3044014::Observation with expected length of stay less than 2 midnights.,Inpatient with expected length of stay greater than 2 midnights.}  Signed: D'Mello, Karmin Kasprzak,  DO Internal Medicine Resident  11/20/2023, 8:16 PM  Please contact IM Residency On-Call Pager at: [phone] or [phone].

## 2023-11-20 NOTE — ED Provider Notes (Signed)
 [facility] AT [facility] Provider Note   CSN: [name] Arrival date & time: 11/20/23  1516     Patient presents with: No chief complaint on file.   David [name] is a 48 y.o. male.   The history is provided by the patient and medical records. No language interpreter was used.  Shortness of Breath Severity:  Severe Onset quality:  Gradual Duration:  1 week Timing:  Constant Progression:  Waxing and waning Chronicity:  Recurrent Context: URI   Relieved by:  Nothing Worsened by:  Coughing Associated symptoms: cough and vomiting   Associated symptoms: no abdominal pain, no chest pain, no fever, no headaches, no neck pain, no rash and no wheezing   Emesis Severity:  Moderate Duration:  3 days Timing:  Constant Progression:  Unchanged Chronicity:  Recurrent Associated symptoms: chills and cough   Associated symptoms: no abdominal pain, no diarrhea, no fever and no headaches        Prior to Admission medications   Medication Sig Start Date End Date Taking? Authorizing Provider  acetaminophen  (TYLENOL ) 325 MG tablet Take 2 tablets (650 mg total) by mouth 3 (three) times daily. 09/19/23   Charmayne Holmes, DO  amoxicillin -clavulanate (AUGMENTIN ) 875-125 MG tablet Take 1 tablet by mouth 2 (two) times daily. 11/19/23   Sofia, Leslie K, PA-C  apixaban  (ELIQUIS ) 5 MG TABS tablet Take 1 tablet (5 mg total) by mouth 2 (two) times daily. 09/20/23   Charmayne Holmes, DO  chlorhexidine  (HIBICLENS ) 4 % external liquid Apply 15 mLs (1 Application total) topically as directed for 30 doses. Use as directed daily for 5 days every other week for 6 weeks. 09/17/23   Danton Lauraine LABOR, PA-C  diazePAM  5 MG/5ML SOLN Take 3 mLs (3 mg total) by mouth every 8 (eight) hours as needed. TAKE 3 MLS (3 MG TOTAL) BY MOUTH EVERY 8 (EIGHT) HOURS AS NEEDED FOR MUSCLE SPASMS (DYSPHAGIA). 10/28/23   Pickenpack-Cousar, Fannie SAILOR, NP  ferrous sulfate  300 (60 Fe) MG/5ML syrup TAKE 5 MLS  (300 MG TOTAL) BY MOUTH DAILY. 10/20/23 01/18/24  Kandis Perkins, DO  fluconazole  (DIFLUCAN ) 200 MG tablet Take 2 tablets (400 mg total) by mouth daily. 10/28/23   D'Mello, Rosalyn, DO  ipratropium-albuterol  (DUONEB) 0.5-2.5 (3) MG/3ML SOLN Take 3 mLs by nebulization in the morning, at noon, in the evening, and at bedtime. 11/19/23   Jenna Maude BRAVO, NP  lidocaine  (XYLOCAINE ) 2 % solution Use as directed 15 mLs in the mouth or throat every 6 (six) hours as needed for mouth pain. Mix 1part 2% viscous lidocaine , 1part water . Swish and spit 30mL of total diluted mixture up to eight times a day, prn as needed for soreness 11/04/23   Pickenpack-Cousar, Fannie SAILOR, NP  methadone  (DOLOPHINE ) 10 MG/ML solution Take 130 mg by mouth every 8 (eight) hours.    [provider]  methocarbamol  (ROBAXIN ) 500 MG tablet Take 1 tablet (500 mg total) by mouth every 6 (six) hours as needed for muscle spasms. 09/19/23   Lovie Clarity, MD  Misc. Devices (ROLLATOR ULTRA-LIGHT) MISC Use as needed 09/19/23   Charmayne Holmes, DO  Multiple Vitamin (MULTIVITAMIN WITH MINERALS) TABS tablet Take 1 tablet by mouth daily. 09/20/23   Charmayne Holmes, DO  ondansetron  (ZOFRAN -ODT) 4 MG disintegrating tablet Take 1 tablet (4 mg total) by mouth every 8 (eight) hours as needed for nausea or vomiting. 11/10/23   Zelaya, Oscar A, PA-C  Pregabalin  20 MG/ML SOLN Take 50 mg by mouth 3 (three)  times daily. 11/10/23   Zelaya, Oscar A, PA-C  sertraline  (ZOLOFT ) 20 MG/ML concentrated solution MIX 1.25 MLS (25 MG TOTAL) WITH 1/2 CUP OF WATER  AND TAKE BY MOUTH DAILY. 07/21/23   Kandis Perkins, DO  sodium chloride  HYPERTONIC 3 % nebulizer solution Take by nebulization 2 (two) times daily. 11/19/23   Jenna Maude BRAVO, NP  Vitamin D , Ergocalciferol , (DRISDOL ) 1.25 MG (50000 UNIT) CAPS capsule Take 1 capsule (50,000 Units total) by mouth every 7 (seven) days. 09/23/23   Lovie Clarity, MD    Allergies: Patient has no known allergies.    Review of Systems   Constitutional:  Positive for chills and fatigue. Negative for fever.  HENT:  Negative for congestion.   Respiratory:  Positive for cough, chest tightness and shortness of breath. Negative for wheezing and stridor.   Cardiovascular:  Negative for chest pain, palpitations and leg swelling.  Gastrointestinal:  Positive for nausea and vomiting. Negative for abdominal pain, constipation and diarrhea.  Genitourinary:  Negative for dysuria and flank pain.  Musculoskeletal:  Negative for back pain and neck pain.  Skin:  Negative for rash and wound.  Neurological:  Positive for light-headedness. Negative for headaches.  Psychiatric/Behavioral:  Negative for agitation and confusion.   All other systems reviewed and are negative.   Updated Vital Signs BP 114/74 (BP Location: Right Arm)   Pulse (!) 50   Temp 98 F (36.7 C) (Oral)   Resp 16   SpO2 100%   Physical Exam Vitals and nursing note reviewed.  Constitutional:      General: He is not in acute distress.    Appearance: He is cachectic. He is ill-appearing.  HENT:     Head: Normocephalic and atraumatic.     Mouth/Throat:     Mouth: Mucous membranes are dry.     Pharynx: No oropharyngeal exudate or posterior oropharyngeal erythema.  Eyes:     Extraocular Movements: Extraocular movements intact.     Conjunctiva/sclera: Conjunctivae normal.     Pupils: Pupils are equal, round, and reactive to light.  Cardiovascular:     Rate and Rhythm: Regular rhythm. Tachycardia present.     Heart sounds: No murmur heard. Pulmonary:     Effort: Pulmonary effort is normal. No respiratory distress.     Breath sounds: Rhonchi and rales present. No wheezing.  Chest:     Chest wall: No tenderness.  Abdominal:     Palpations: Abdomen is soft.     Tenderness: There is no abdominal tenderness. There is no guarding or rebound.  Musculoskeletal:        General: No swelling or tenderness.     Cervical back: Neck supple. No tenderness.     Right lower  leg: No edema.     Left lower leg: No edema.  Skin:    General: Skin is warm and dry.     Capillary Refill: Capillary refill takes less than 2 seconds.     Findings: No erythema or rash.  Neurological:     General: No focal deficit present.     Mental Status: He is alert.     (all labs ordered are listed, but only abnormal results are displayed) Labs Reviewed  CBC - Abnormal; Notable for the following components:      Result Value   RBC 3.68 (*)    Hemoglobin 9.7 (*)    HCT 31.4 (*)    RDW 16.5 (*)    All other components within normal limits  I-STAT CHEM 8,  ED - Abnormal; Notable for the following components:   Sodium 133 (*)    Creatinine, Ser 0.50 (*)    Calcium, Ion 0.91 (*)    Hemoglobin 10.9 (*)    HCT 32.0 (*)    All other components within normal limits  I-STAT CG4 LACTIC ACID, ED - Abnormal; Notable for the following components:   Lactic Acid, Venous 2.4 (*)    All other components within normal limits  URINALYSIS, W/ REFLEX TO CULTURE (INFECTION SUSPECTED)  CK  COMPREHENSIVE METABOLIC PANEL WITH GFR  LIPASE, BLOOD  I-STAT CG4 LACTIC ACID, ED    EKG: EKG Interpretation Date/Time:  Thursday November 20 2023 16:31:12 EDT Ventricular Rate:  90 PR Interval:  119 QRS Duration:  97 QT Interval:  423 QTC Calculation: 518 R Axis:   64  Text Interpretation: Sinus rhythm Ventricular premature complex Borderline short PR interval Probable left ventricular hypertrophy Borderline T abnormalities, diffuse leads Prolonged QT interval when compared to prior, new PVC and longer QTc No STEMI Confirmed by Ginger Barefoot ([id number]) on 11/20/2023 6:25:22 PM  Radiology: [facility] Chest Port 1 View Result Date: 11/20/2023 CLINICAL DATA:  Dyspnea 3 days intermittently. EXAM: PORTABLE CHEST 1 VIEW COMPARISON:  11/19/2023 and 11/10/2023 as well as chest CT 11/19/2023 FINDINGS: Tracheostomy tube in adequate position. Patient is rotated to the right. Lungs are adequately inflated. There is  stable left apical pleural thickening. Mild interstitial prominence over the right upper lobe/apex unchanged. No focal lobar consolidation or effusion. Recently seen airspace process on CT not well visualized radiographically. Cardiomediastinal silhouette and remainder of the exam is unchanged. IMPRESSION: 1. No acute cardiopulmonary disease. 2. Stable left apical pleural thickening. Stable mild interstitial prominence over the right upper lobe/apex. Electronically Signed   By: Toribio Agreste M.D.   On: 11/20/2023 16:26   CT Angio Chest PE W and/or Wo Contrast Result Date: 11/19/2023 EXAM: CTA of the Chest with contrast for PE 11/19/2023 05:21:25 AM TECHNIQUE: CTA of the chest was performed without and with the administration of intravenous contrast (75 mL iohexol  (OMNIPAQUE ) 350 MG/ML injection). Multiplanar reformatted images are provided for review. MIP images are provided for review. Automated exposure control, iterative reconstruction, and/or weight based adjustment of the mA/kV was utilized to reduce the radiation dose to as low as reasonably achievable. COMPARISON: 10/11/2023 CLINICAL HISTORY: Pulmonary embolism (PE) suspected, high prob. FINDINGS: PULMONARY ARTERIES: Pulmonary arteries are adequately opacified for evaluation. No pulmonary embolism. Main pulmonary artery is normal in caliber. MEDIASTINUM: The heart and pericardium demonstrate no acute abnormality. There is no acute abnormality of the thoracic aorta. Aortic atherosclerotic calcifications. Tracheostomy tube is in place with tip above the carina. LYMPH NODES: No mediastinal, hilar or axillary lymphadenopathy. LUNGS AND PLEURA: Emphysema with diffuse bronchial wall thickening. Scattered areas of scarring with architectural distortion compatible with postinflammatory change. Multifocal patchy areas of ground glass attenuation noted within the upper and lower lung zones. Within the right upper lobe there is focal area of subpleural consolidation  and pleural thickening which appears similar to the exam from 10/11/2023. Multiple peribronchovascular nodules are scattered throughout the left lower lobe which has an appearance similar to the previous exam and is likely the sequelae of recurrent aspiration versus atypical infection. Signs of previous wedge resection from the anteromedial left upper lobe. No pleural effusion or pneumothorax. UPPER ABDOMEN: No acute abnormality within the imaged portions of the upper abdomen. 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 No acute soft tissue abnormality. IMPRESSION: 1. No evidence of pulmonary embolism. 2. Multiple peribronchovascular nodules and patchy areas of ground glass attention noted, likely sequelae of recurrent aspiration versus atypical infection, similar to prior exam. 3. Focal subpleural consolidation and pleural thickening in the right upper lobe, similar to prior exam. 4. Scattered areas of scarring, architectural distortion, and volume loss within the right lung compatible with sequelae of chronic inflammation or infection Electronically signed by: Waddell Calk MD 11/19/2023 05:40 AM EDT RP Workstation: [id number]   DG Chest Port 1 View Result Date: 11/19/2023 CLINICAL DATA:  Shortness of breath EXAM: PORTABLE CHEST 1 VIEW COMPARISON:  11/10/2023 FINDINGS: Cardiac shadow is within normal limits. Tracheostomy tube is noted. Patient is rotated to the right accentuating the mediastinal markings. No focal infiltrate is seen. Some chronic scarring is noted stable from the prior study. IMPRESSION: No acute abnormality noted. Electronically Signed   By: Oneil Devonshire M.D.   On: 11/19/2023 02:19     Procedures   Medications Ordered in the ED  trimethobenzamide PHYLLISTINE) injection 200 mg (has no administration in time range)  sodium chloride  0.9 % bolus 1,000 mL (0 mLs Intravenous  Stopped 11/20/23 1902)  fentaNYL  (SUBLIMAZE ) injection 50 mcg (50 mcg Intravenous Given 11/20/23 1726)                                    Medical Decision Making Risk Prescription drug management. Decision regarding hospitalization.    David [name] is a 48 y.o. male with a past medical history significant for laryngeal cancer with trach, heart failure, recent diagnosis of atypical pneumonia on Augmentin , and cachexia who presents with nausea, dry heaving, decreased oral intake, severe fatigue, and continued shortness of breath.  According to patient, he was seen in the hospital yesterday and overnight and was discharged home with 10 days of Augmentin  for atypical lung infection.  He says he got home and had worsened shortness of breath and could not even tolerate taking his oral medications due to his dry heaving.  He said he previously had a G-tube that fell out and he now gets his nutrition orally.  He reports he is diffusely hurting all over but no focal chest pain today.  He reports malaise and fatigue and has no energy.  Patient had CT PE study overnight that showed possible atypical infection versus changes from previous radiation but no large pulmonary embolism.  He had a consult with pulmonology and they recommended a 10-day course of Augmentin  for aspiration and follow-up in 7 days for pulmonary clinic.  Patient says he is not tolerating this plan due to his inability to eat and drink and swallow and is feeling worse and worse.  On my exam, lungs are coarse bilaterally.  Chest is nontender.  Abdomen is nontender.  He did have bowel sounds.  Patient is very dry appearing with dry mucous membranes and is very thin and cachectic.  Given the patient's failure of outpatient manage worse for this atypical infection, anticipate he will need admission for rehydration, allowing him IV treatment of this infection since he is not tolerating p.o., and symptom relief.  Anticipate  admission after workup.  X-ray appears similar to prior.  Lactic acid is elevated when it was not earlier.  Suspect some dehydration.  Due to him failing the outpatient management, medicine was called and they will admit for  further management IV antibiotics and rehydration and nausea treatment.  Patient did have prolonged QT so Tigan was ordered instead of Zofran .  Patient will be admitted for further management.      Final diagnoses:  Lactic acidosis  Shortness of breath  Fatigue, unspecified type  Nausea  Dehydration  Cough, unspecified type     Clinical Impression: 1. Lactic acidosis   2. Shortness of breath   3. Fatigue, unspecified type   4. Nausea   5. Dehydration   6. Cough, unspecified type     Disposition: Admit  This note was prepared with assistance of Dragon voice recognition software. Occasional wrong-word or sound-a-like substitutions may have occurred due to the inherent limitations of voice recognition software.      Michi Herrmann, Lonni PARAS, MD 11/20/23 2023

## 2023-11-20 NOTE — H&P (Incomplete)
 Date: 11/20/2023               Patient Name:  David [name] MRN: [id number]  DOB: [birth year] Age / Sex: 48 y.o., male   PCP: Kandis Perkins, DO         Medical Service: Internal Medicine Teaching Service         Attending Physician: Dr. Jone Dauphin      First Contact: Melvenia Morrison, MD}    Second Contact: Dr. Perkins Kandis, DO         Pager Information: First Contact Pager: [phone]   Second Contact Pager: [phone]   SUBJECTIVE   Chief Complaint: SOB, abdominal pain   History of Present Illness: Kadarious Dikes is a 48 y.o. 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 For SOB, patient was in the ED yesterday and CTA was done that did not show evidence of PE, but showed some chronic inflammation vs infection. Pulmonology was consulted and sent patient home on augmentin  for 10 days which patient reports taking over the last two days. He has been having worsening shortness of breath for awhile and reports that he has been smoking one cigarette a day. Patient denies any fever, chills. He does report a cough and has been coughing up grainy particles but does endorse some blood from his trach site at times. For his breathing he states that his nebulizers have not been working.  Patient also reports nausea and abdominal pain that has been going on for the last 3-4 days. Patient reports that he has not been taking his prescribed zofran  at home. Patient states that he has not had a bowel movement in the last 4-5 days. Patient reports that he has not been having much of an appetite recently and knows that he has been continuing to lose weight.    ED Course: Labs significant for Hgb 9.7, Na 133,  lactic 2.4 ->1.6  Imaging  CTA from 10/22: no evidence of PE, multiple peribronchovascular nodules and patchy areas of ground glass attenuation, recurrent aspiration vs atypical infection Focal subpleural consolidation and pleural thickening in right upper lobe Scattered areas of scarring, architectural distortion and volume loss, sequelae of chronic inflammation vs infection CXR shows stable left apical pleural thickening and stable mild interstitial prominence over right upper lobe/apex  Received 1L bolus, fentanyl  50 mcg, tigan 200 mg  Consulted IMTS   Past Medical History laryngeal cancer s/p chemotherapy and radiation and tracheostomy protein calorie severe malnutrition candidal endocarditis tobacco abuse 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 chest tubes Left   . IR GASTROSTOMY TUBE MOD SED  06/18/2021  . LAPAROSCOPIC INSERTION GASTROSTOMY TUBE N/A 06/20/2021   Procedure: LAPAROSCOPIC INSERTION GASTROSTOMY TUBE;  Surgeon: Signe Mitzie LABOR, MD;  Location: [facility] OR;  Service: General;  Laterality: N/A;  DOW PATIENT  . TOTAL HIP ARTHROPLASTY Right 09/17/2023   Procedure: ARTHROPLASTY, HIP, TOTAL, ANTERIOR APPROACH;  Surgeon: Kendal Franky SQUIBB, MD;  Location: [facility] OR;  Service: Orthopedics;  Laterality: Right;  . TRACHEOSTOMY TUBE PLACEMENT N/A 06/09/2021   Procedure: AWAKE TRACHEOSTOMY;  Surgeon: Carlie Clark, MD;  Location: [facility] OR;  Service:  ENT;  Laterality: N/A;    Meds:  Current Meds  Medication Sig  . acetaminophen  (TYLENOL ) 325 MG tablet Take 2 tablets (650 mg total) by mouth 3 (three) times daily.  . amoxicillin -clavulanate (AUGMENTIN ) 875-125 MG tablet Take 1 tablet by mouth 2 (two) times daily.  . apixaban  (ELIQUIS ) 5 MG TABS tablet Take 1 tablet (5 mg total) by mouth 2 (two) times daily.  . fluconazole  (DIFLUCAN ) 200 MG tablet Take 2  tablets (400 mg total) by mouth daily.  . lidocaine  (XYLOCAINE ) 2 % solution Use as directed 15 mLs in the mouth or throat every 6 (six) hours as needed for mouth pain. Mix 1part 2% viscous lidocaine , 1part water . Swish and spit 30mL of total diluted mixture up to eight times a day, prn as needed for soreness  . methadone  (DOLOPHINE ) 10 MG/ML solution Take 130 mg by mouth daily.  . Multiple Vitamin (MULTIVITAMIN WITH MINERALS) TABS tablet Take 1 tablet by mouth daily.  . ondansetron  (ZOFRAN -ODT) 4 MG disintegrating tablet Take 1 tablet (4 mg total) by mouth every 8 (eight) hours as needed for nausea or vomiting.  . Pregabalin  20 MG/ML SOLN Take 50 mg by mouth 3 (three) times daily.  . sertraline  (ZOLOFT ) 20 MG/ML concentrated solution MIX 1.25 MLS (25 MG TOTAL) WITH 1/2 CUP OF WATER  AND TAKE BY MOUTH DAILY.  . sodium chloride  HYPERTONIC 3 % nebulizer solution Take by nebulization 2 (two) times daily.  Mother is giving medications  Tylenol  325 mg (2 tablets) by mouth 3 times daily Augmentin  875-100 mg twice daily Eliquis  5 mg twice daily Diazepam  3 mL (3mg ) every 8 hours as needed Ferrous sulfate  5mL (300 mg) by mouth daily- not taking Fluconazole  400 mg daily DuoNebs TID Xylocaine  solution 15 mL Q6H prn Methadone  130 mg every 8 hours Robaxin  500 mg every 6 as needed- not taking  Multivitamin Zofran  4 mg every 8 hours as needed- not taking  Pregabalin  50 mg 3 times daily- not taking  Zoloft  1.25 mL (25 mg) daily Hypertonic saline nebs twice daily Vitamin D  supplementation  Social:  Lives With:lives by himself Occupation: not working  Support:mother  Level of Function:dependent in ADLs and iADLs PCP:  Kandis Perkins, DO  Substances: -Tobacco: 1 cigarette a day  -Alcohol: does drink, unsure how much -Recreational Drug: none   Family History:  History reviewed. No pertinent family history.   Allergies: Allergies as of 11/20/2023  . (No Known Allergies)    Review of Systems: A  complete ROS was negative except as per HPI.   OBJECTIVE:   Physical Exam: Blood pressure (!) 133/100, pulse (!) 52, temperature 98 F (36.7 C), resp. rate 17, SpO2 100%.  Constitutional: chronically ill, cachectic male, in no acute distress HENT: normocephalic atraumatic, mucous membranes dry Eyes: conjunctiva non-erythematous Cardiovascular: regular rate and rhythm, no murmurs heard  Pulmonary/Chest:normal work of breathing on trach collar on 10 L/ min, coarse rhonchi bilaterally in all lung fields  Abdominal: soft, tenderness to palpation RLQ and LLQ non-distended, no rebound or guarding  Neurological: alert & oriented x 3,  Skin: warm  Psych: tearful   Labs: CBC    Component Value Date/Time   WBC 8.3 11/20/2023 1550   RBC 3.68 (L) 11/20/2023 1550   HGB 10.9 (L) 11/20/2023 1736   HGB 9.1 (L) 10/10/2023 1140   HCT 32.0 (L) 11/20/2023 1736   HCT 29.7 (L) 10/10/2023 1140   PLT 243 11/20/2023 1550   PLT 203 10/10/2023 1140   MCV 85.3  11/20/2023 1550   MCV 87 10/10/2023 1140   MCH 26.4 11/20/2023 1550   MCHC 30.9 11/20/2023 1550   RDW 16.5 (H) 11/20/2023 1550   RDW 17.0 (H) 10/10/2023 1140   LYMPHSABS 0.5 (L) 11/19/2023 0351   MONOABS 0.4 11/19/2023 0351   EOSABS 0.1 11/19/2023 0351   BASOSABS 0.0 11/19/2023 0351     CMP     Component Value Date/Time   NA 133 (L) 11/20/2023 1736   NA 138 11/13/2022 1540   K 4.8 11/20/2023 1736   CL 101 11/20/2023 1736   CO2 25 11/19/2023 0351   GLUCOSE 82 11/20/2023 1736   BUN 10 11/20/2023 1736   BUN 11 11/13/2022 1540   CREATININE 0.50 (L) 11/20/2023 1736   CREATININE 0.66 11/08/2022 0859   CREATININE 0.78 10/24/2022 1451   CALCIUM 8.9 11/19/2023 0351   PROT 8.2 (H) 11/19/2023 0351   PROT 7.1 11/13/2022 1540   ALBUMIN  3.3 (L) 11/19/2023 0351   ALBUMIN  3.7 (L) 11/13/2022 1540   AST 32 11/19/2023 0351   AST 46 (H) 11/08/2022 0859   ALT 16 11/19/2023 0351   ALT 19 11/08/2022 0859   ALKPHOS 65 11/19/2023 0351   BILITOT 0.3  11/19/2023 0351   BILITOT <0.2 11/13/2022 1540   BILITOT 0.6 11/08/2022 0859   GFRNONAA >60 11/19/2023 0351   GFRNONAA >60 11/08/2022 0859   GFRAA  01/25/2008 2040    >60        The eGFR has been calculated using the MDRD equation. This calculation has not been validated in all clinical    Imaging: DG Chest Port 1 View Result Date: 11/20/2023 CLINICAL DATA:  Dyspnea 3 days intermittently. EXAM: PORTABLE CHEST 1 VIEW COMPARISON:  11/19/2023 and 11/10/2023 as well as chest CT 11/19/2023 FINDINGS: Tracheostomy tube in adequate position. Patient is rotated to the right. Lungs are adequately inflated. There is stable left apical pleural thickening. Mild interstitial prominence over the right upper lobe/apex unchanged. No focal lobar consolidation or effusion. Recently seen airspace process on CT not well visualized radiographically. Cardiomediastinal silhouette and remainder of the exam is unchanged. IMPRESSION: 1. No acute cardiopulmonary disease. 2. Stable left apical pleural thickening. Stable mild interstitial prominence over the right upper lobe/apex. Electronically Signed   By: Toribio Agreste M.D.   On: 11/20/2023 16:26   CT Angio Chest PE W and/or Wo Contrast Result Date: 11/19/2023 EXAM: CTA of the Chest with contrast for PE 11/19/2023 05:21:25 AM TECHNIQUE: CTA of the chest was performed without and with the administration of intravenous contrast (75 mL iohexol  (OMNIPAQUE ) 350 MG/ML injection). Multiplanar reformatted images are provided for review. MIP images are provided for review. Automated exposure control, iterative reconstruction, and/or weight based adjustment of the mA/kV was utilized to reduce the radiation dose to as low as reasonably achievable. COMPARISON: 10/11/2023 CLINICAL HISTORY: Pulmonary embolism (PE) suspected, high prob. FINDINGS: PULMONARY ARTERIES: Pulmonary arteries are adequately opacified for evaluation. No pulmonary embolism. Main pulmonary artery is normal in  caliber. MEDIASTINUM: The heart and pericardium demonstrate no acute abnormality. There is no acute abnormality of the thoracic aorta. Aortic atherosclerotic calcifications. Tracheostomy tube is in place with tip above the carina. LYMPH NODES: No mediastinal, hilar or axillary lymphadenopathy. LUNGS AND PLEURA: Emphysema with diffuse bronchial wall thickening. Scattered areas of scarring with architectural distortion compatible with postinflammatory change. Multifocal patchy areas of ground glass attenuation noted within the upper and lower lung zones. Within the right upper lobe there is focal area of subpleural consolidation and pleural  thickening which appears similar to the exam from 10/11/2023. Multiple peribronchovascular nodules are scattered throughout the left lower lobe which has an appearance similar to the previous exam and is likely the sequelae of recurrent aspiration versus atypical infection. Signs of previous wedge resection from the anteromedial left upper lobe. No pleural effusion or pneumothorax. UPPER ABDOMEN: No acute abnormality within the imaged portions of the upper abdomen. SOFT TISSUES AND BONES: Nonunion fracture deformity involving the proximal body of the sternum is unchanged in appearance from the previous exam coronal image 104/8. No acute soft tissue abnormality. IMPRESSION: 1. No evidence of pulmonary embolism. 2. Multiple peribronchovascular nodules and patchy areas of ground glass attention noted, likely sequelae of recurrent aspiration versus atypical infection, similar to prior exam. 3. Focal subpleural consolidation and pleural thickening in the right upper lobe, similar to prior exam. 4. Scattered areas of scarring, architectural distortion, and volume loss within the right lung compatible with sequelae of chronic inflammation or infection Electronically signed by: Waddell Calk MD 11/19/2023 05:40 AM EDT RP Workstation: [id number]   DG Chest Port 1 View Result Date:  11/19/2023 CLINICAL DATA:  Shortness of breath EXAM: PORTABLE CHEST 1 VIEW COMPARISON:  11/10/2023 FINDINGS: Cardiac shadow is within normal limits. Tracheostomy tube is noted. Patient is rotated to the right accentuating the mediastinal markings. No focal infiltrate is seen. Some chronic scarring is noted stable from the prior study. IMPRESSION: No acute abnormality noted. Electronically Signed   By: Oneil Devonshire M.D.   On: 11/19/2023 02:19     EKG: (from yesterday) personally reviewed my interpretation is normal sinus rhythm with T wave abnormality   ASSESSMENT & PLAN:   Assessment & Plan by Problem: Principal Problem:   Acute hypoxic respiratory failure (HCC) Active Problems:   Laryngeal cancer (HCC)   Protein-calorie malnutrition, severe   Tracheostomy status (HCC)   Candidal endocarditis   Continuous tobacco abuse   Advanced care planning/counseling discussion   Abdominal pain   Harvir Patry is a 48 y.o. person living with a history of *** who presented with *** and admitted for *** on hospital day 0  #Acute Hypoxic respiratory failure Patient is trach dependent. As seen on CTA yesterday scan shows signs of atypical infection vs inflammation. No signs of pulmonary embolism. I believe that this is likely chronic aspiration pneumonitis. Vital signs are stable and patient denies any infectious symptoms.I do not believe that he is septic at this time.  I think the reason he is now having to use trach collar in the hospital is due to increased secretions. Seems like deep suctioning does decrease his SOB. Pulmonology had seen patient and discharged him from the ED with augmentin  for 10 days which patient reports taking. Based off of exam, do not believe this is in the setting of volume overload. At this time would like to rule out any infectious causes - will check for strep pneumoniae urine antigen, legionella pneumo - procalcitonin <.10 which means less likely bacterial  infection - will check full respiratory panel for any viruses  - airborne and contact precautions until respiratory panel comes back  - respiratory therapy to follow  - scheduled duonebs every 6 hours  -will not continue augmentin  at this time as lower differential for acute bacterial infection  #abdominal pain   #Lactic acidosis, resolved 2.4 to 1.6. Could be due to decreased PO intake. Seem to improve with fluids. Do not believe this is in the setting of infection    #failure to thrive #  protein- calorie malnutrition, severe Patient is trach dependent and is at chronic risk of aspiration. Weight at office visit on 9/26 was 81 lbs and today is at 77lbs. Patient states that he has not had much of an appetite and has been eating less.  - Registered dietitian consulted for further recommendations.   #advanced care planning Per last appointment with Dr.Zheng and noted in multiple notes, patient is chronically ill. Also ntoed to have severe protein- calorie malnutrition.  Advanced care planning was discussed heavily on 9/26 office visit at [facility]. Patient believes that he is eating more and will continue getting better per that note. Patient follows outpatient with palliative care. MOST form was deferred at that office visit. Needs more GOC of discussions during this hospitalization and MOST form should be discussed. Also think that patient needs further discussion with code status.   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 He has completed radiation therapy to the larynx on 02/09/2021, He follows with ENT at [facility] and follows with trach clinic.  Deep suctioning has been helping patient's breathing.  - RT following for trach care - No acute concerns   #Chronic pain syndrome Patient follows palliative care for chronic pain.  He is currently on methadone  at home.  His current dose  includes 130 mg of methadone  daily as well as pregabalin  50 mg 3 times daily. 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 He follows with Dr. Overton of ID.  Per his last note on 01/07/2023, patient to be on 400 mg of fluconazole  daily.  He is to continue this indefinitely.  There has been some charting inconsistency between 400 and 800 daily, confirmed with patient it is 400 mg daily. -Resume home fluconazole  400 mg daily   #History of right IJ vein occlusion Patient has a past medical history of right IJ vein occlusion.  This was diagnosed on admission in 09/04/2022.  Patient encouraged to be on Eliquis  indefinitely.  - continue apixaban  5 mg BID     Best practice: Diet: Normal VTE: DOAC IVF: none Code: Full  Disposition planning: Prior to Admission Living Arrangement: Home, living by himself Anticipated Discharge Location: pending Barriers to Discharge: symptom improvement and further workup  Dispo: Admit patient to Observation with expected length of stay less than 2 midnights.  Signed: D'Mello, Marissa Lowrey, DO Internal Medicine Resident  11/20/2023, 11:43 PM  Please contact IM Residency On-Call Pager at: [phone] or [phone].

## 2023-11-20 NOTE — ED Triage Notes (Signed)
 Pt BIB [facility] from home due to low appetite for the past three days along with intermittent SHOB; global pain.  Pt was in bigemy en route with [facility]. HR 80-90.  Pt given 100mcg Fentanyl  intramuscularly. VS BP 114/86, CBG 147, SpO2 96%RA

## 2023-11-20 NOTE — ED Notes (Signed)
 Awaiting Tigan from main pharmacy.

## 2023-11-20 NOTE — ED Notes (Signed)
 Per MD Tegeler pt is allowed to eat apple sauce if he can tolerate it. Pt provided with suction. Pt verbalized that he is comfortable suctioning himself. Pt is tolerating food at this time.

## 2023-11-20 NOTE — ED Notes (Signed)
 Ed tech attempted to get blood work on pt. Pt stated that he has been stuck head to toe multiple times today and has no desire to be stuck anymore. Pt states that he is so tired and just wants to relax and go to sleep. Primary RN aware. MD Tegeler at bedside.

## 2023-11-20 NOTE — ED Notes (Signed)
 Patient repeatedly calling nurses station for his medication. Patient has been told RN cannot give him medication until it is verified through pharmacy. Patient also repeatedly taking off monitoring equipment and walking down the hall.

## 2023-11-20 NOTE — Hospital Course (Addendum)
 David [name] is a 48 y.o. 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    #Acute Hypoxic respiratory failure Patient is trach dependent. As seen on CTA yesterday scan shows signs of atypical infection vs inflammation. No signs of pulmonary embolism. Patient is trach dependent and likely chronically aspirates. I suspect chronic aspiration and poor pulmonary toilet led to an acute pneumonitis with symptoms of SOB. Pulmonology had seen patient and discharged him from the ED with augmentin  for 10 days which patient reports taking. Based off of exam, do not believe this is in the setting of volume overload. At this time would like to rule out any infectious causes - will check for strep pneumoniae urine antigen, legionella pneumo - procalcitonin <.10 which means less likely bacterial infection with CAP - RVP COVID, Flu, RSV, and extended panel neg - respiratory therapy to follow  - scheduled duonebs every 6 hours  -will not continue augmentin  , but will treat with IV  unasyn  for 7 days for possible aspiration pneumonia    #abdominal pain  Based off of abdominal exam patient does not have any peritonitic signs. Do not believe this is acute abdomen. Patient does have chronic pain and this could be apart of this. Patient complains of diffuse abdominal tenderness and not having bowel movement in 3-4 days but also reports minimal PO intake. If patient's abdominal exam is worsening could consider KUB Lipase, AST/ALT/alk phos wnl, UA wnl. KUB 10/24 c/w ileus.  - UA does not show any signs of infection    #Lactic acidosis, resolved 2.4 to 1.6.  Could be due to decreased PO intake. Seem to improve with fluids. Do not believe this is in the setting of infection    #failure to thrive #protein- calorie malnutrition, severe Patient is trach dependent and is at chronic risk of aspiration. Weight at office visit on 9/26 was 81 lbs and today is at 77lbs. Patient states that he has not had much of an appetite and has been eating less. CK normal. Severely malnourished with scaphoid abdomen, muscle wasting, and pallor on exam.  - Registered dietitian consulted for further recommendations.    #chronic dysphagia Will consult SLP for this. Likely in the setting of radiation.    #advanced care planning Per last appointment with Dr.Zheng and noted in multiple notes, patient is chronically ill. Also ntoed to have severe protein- calorie malnutrition.  Advanced care planning was discussed heavily on 9/26 office visit at [facility]. Patient believes that he is eating more and will continue getting better per that note. Patient follows outpatient with palliative care. MOST form was deferred at that office visit. Needs more GOC of discussions during this hospitalization and MOST form should be discussed. Also think that patient needs further discussion with code status.    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 He has completed radiation therapy to the larynx on 02/09/2021, He follows with ENT at [facility] and follows with trach clinic.  Deep suctioning has been helping patient's breathing.  - RT following for trach care - No acute concerns   #Chronic pain  syndrome Patient follows palliative care for chronic pain.  He is currently on methadone  at home.  His current dose includes 130 mg of methadone  daily as well as pregabalin  50 mg 3 times daily. 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 He follows with Dr. Overton of ID.  Per his last note on 01/07/2023, patient to be on 400 mg of fluconazole  daily.  He is to continue this indefinitely.  There has been some charting inconsistency between 400 and 800 daily, confirmed with patient it is 400 mg daily. -Resume home fluconazole  400 mg daily   #History of right IJ vein occlusion Patient has a past medical history of right IJ vein occlusion.  This was diagnosed on admission in 09/04/2022.  Patient encouraged to be on Eliquis  indefinitely.  - continue apixaban  5 mg BID    Pooped Wants food Ab pain is slighty ??? Better idk No longer constant pain

## 2023-11-21 ENCOUNTER — Observation Stay (HOSPITAL_COMMUNITY): Payer: MEDICAID

## 2023-11-21 DIAGNOSIS — Z79899 Other long term (current) drug therapy: Secondary | ICD-10-CM

## 2023-11-21 DIAGNOSIS — J189 Pneumonia, unspecified organism: Secondary | ICD-10-CM

## 2023-11-21 DIAGNOSIS — J9601 Acute respiratory failure with hypoxia: Secondary | ICD-10-CM | POA: Diagnosis not present

## 2023-11-21 DIAGNOSIS — K567 Ileus, unspecified: Secondary | ICD-10-CM | POA: Insufficient documentation

## 2023-11-21 DIAGNOSIS — Z79891 Long term (current) use of opiate analgesic: Secondary | ICD-10-CM

## 2023-11-21 DIAGNOSIS — E43 Unspecified severe protein-calorie malnutrition: Secondary | ICD-10-CM

## 2023-11-21 DIAGNOSIS — Z8521 Personal history of malignant neoplasm of larynx: Secondary | ICD-10-CM | POA: Diagnosis not present

## 2023-11-21 DIAGNOSIS — R627 Adult failure to thrive: Secondary | ICD-10-CM

## 2023-11-21 DIAGNOSIS — Z86718 Personal history of other venous thrombosis and embolism: Secondary | ICD-10-CM

## 2023-11-21 DIAGNOSIS — R109 Unspecified abdominal pain: Secondary | ICD-10-CM

## 2023-11-21 DIAGNOSIS — Z8619 Personal history of other infectious and parasitic diseases: Secondary | ICD-10-CM

## 2023-11-21 DIAGNOSIS — Z930 Tracheostomy status: Secondary | ICD-10-CM

## 2023-11-21 DIAGNOSIS — G894 Chronic pain syndrome: Secondary | ICD-10-CM

## 2023-11-21 DIAGNOSIS — Z7901 Long term (current) use of anticoagulants: Secondary | ICD-10-CM

## 2023-11-21 LAB — COMPREHENSIVE METABOLIC PANEL WITH GFR
ALT: 13 U/L (ref 0–44)
AST: 36 U/L (ref 15–41)
Albumin: 3.1 g/dL — ABNORMAL LOW (ref 3.5–5.0)
Alkaline Phosphatase: 53 U/L (ref 38–126)
Anion gap: 13 (ref 5–15)
BUN: 6 mg/dL (ref 6–20)
CO2: 23 mmol/L (ref 22–32)
Calcium: 8.4 mg/dL — ABNORMAL LOW (ref 8.9–10.3)
Chloride: 96 mmol/L — ABNORMAL LOW (ref 98–111)
Creatinine, Ser: 0.54 mg/dL — ABNORMAL LOW (ref 0.61–1.24)
GFR, Estimated: >60 mL/min (ref >60)
Glucose, Bld: 85 mg/dL (ref 70–99)
Potassium: 3.8 mmol/L (ref 3.5–5.1)
Sodium: 132 mmol/L — ABNORMAL LOW (ref 135–145)
Total Bilirubin: 0.7 mg/dL (ref 0.0–1.2)
Total Protein: 7.2 g/dL (ref 6.5–8.1)

## 2023-11-21 LAB — RESPIRATORY PANEL BY PCR

## 2023-11-21 LAB — CK: Total CK: 350 U/L (ref 49–397)

## 2023-11-21 LAB — HEPATIC FUNCTION PANEL
ALT: 14 U/L (ref 0–44)
AST: 33 U/L (ref 15–41)
Albumin: 3.2 g/dL — ABNORMAL LOW (ref 3.5–5.0)
Alkaline Phosphatase: 51 U/L (ref 38–126)
Bilirubin, Direct: <0.1 mg/dL (ref 0.0–0.2)
Total Bilirubin: 0.5 mg/dL (ref 0.0–1.2)
Total Protein: 7.5 g/dL (ref 6.5–8.1)

## 2023-11-21 LAB — BASIC METABOLIC PANEL WITH GFR
Anion gap: 12 (ref 5–15)
BUN: 5 mg/dL — ABNORMAL LOW (ref 6–20)
CO2: 22 mmol/L (ref 22–32)
Calcium: 8.8 mg/dL — ABNORMAL LOW (ref 8.9–10.3)
Chloride: 98 mmol/L (ref 98–111)
Creatinine, Ser: 0.52 mg/dL — ABNORMAL LOW (ref 0.61–1.24)
GFR, Estimated: >60 mL/min (ref >60)
Glucose, Bld: 76 mg/dL (ref 70–99)
Potassium: 3.6 mmol/L (ref 3.5–5.1)
Sodium: 132 mmol/L — ABNORMAL LOW (ref 135–145)

## 2023-11-21 LAB — CBC
HCT: 33.4 % — ABNORMAL LOW (ref 39.0–52.0)
Hemoglobin: 10.4 g/dL — ABNORMAL LOW (ref 13.0–17.0)
MCH: 26.1 pg (ref 26.0–34.0)
MCHC: 31.1 g/dL (ref 30.0–36.0)
MCV: 83.9 fL (ref 80.0–100.0)
Platelets: 259 K/uL (ref 150–400)
RBC: 3.98 MIL/uL — ABNORMAL LOW (ref 4.22–5.81)
RDW: 16.2 % — ABNORMAL HIGH (ref 11.5–15.5)
WBC: 7.2 K/uL (ref 4.0–10.5)
nRBC: 0 % (ref 0.0–0.2)

## 2023-11-21 LAB — RESP PANEL BY RT-PCR (RSV, FLU A&B, COVID)  RVPGX2
Influenza A by PCR: NEGATIVE
Influenza B by PCR: NEGATIVE
Resp Syncytial Virus by PCR: NEGATIVE
SARS Coronavirus 2 by RT PCR: NEGATIVE

## 2023-11-21 LAB — STREP PNEUMONIAE URINARY ANTIGEN: Strep Pneumo Urinary Antigen: NEGATIVE

## 2023-11-21 LAB — LIPASE, BLOOD: Lipase: 21 U/L (ref 11–51)

## 2023-11-21 MED ORDER — SODIUM CHLORIDE 0.9 % IV SOLN
3.0000 g | Freq: Four times a day (QID) | INTRAVENOUS | Status: DC
  Administered 2023-11-21: 3 g via INTRAVENOUS
  Filled 2023-11-21: qty 8

## 2023-11-21 MED ORDER — GUAIFENESIN ER 600 MG PO TB12
600.0000 mg | ORAL_TABLET | Freq: Two times a day (BID) | ORAL | Status: DC
  Administered 2023-11-21 – 2023-11-24 (×7): 600 mg via ORAL
  Filled 2023-11-21 (×7): qty 1

## 2023-11-21 MED ORDER — ENSURE PLUS HIGH PROTEIN PO LIQD
237.0000 mL | Freq: Two times a day (BID) | ORAL | Status: DC
  Administered 2023-11-21 – 2023-11-24 (×5): 237 mL via ORAL

## 2023-11-21 MED ORDER — SENNA 8.6 MG PO TABS
2.0000 | ORAL_TABLET | Freq: Two times a day (BID) | ORAL | Status: DC
  Administered 2023-11-21: 17.2 mg via ORAL
  Filled 2023-11-21: qty 2

## 2023-11-21 MED ORDER — BISACODYL 5 MG PO TBEC: 10.0000 mg | DELAYED_RELEASE_TABLET | Freq: Every day | ORAL | Status: DC | PRN

## 2023-11-21 MED ORDER — SODIUM CHLORIDE 0.9 % IV SOLN
3.0000 g | Freq: Four times a day (QID) | INTRAVENOUS | Status: DC
  Administered 2023-11-21 – 2023-11-24 (×11): 3 g via INTRAVENOUS
  Filled 2023-11-21 (×10): qty 8

## 2023-11-21 MED ORDER — SODIUM CHLORIDE 3 % IN NEBU
4.0000 mL | INHALATION_SOLUTION | Freq: Two times a day (BID) | RESPIRATORY_TRACT | Status: DC
  Administered 2023-11-21 – 2023-11-22 (×3): 4 mL via RESPIRATORY_TRACT
  Filled 2023-11-21 (×5): qty 4

## 2023-11-21 MED ORDER — SMOG ENEMA
200.0000 mL | Freq: Once | RECTAL | Status: DC
Start: 2023-11-21 — End: 2023-11-22
  Filled 2023-11-21: qty 960

## 2023-11-21 MED ORDER — BISACODYL 10 MG RE SUPP
10.0000 mg | Freq: Every day | RECTAL | Status: DC | PRN
  Administered 2023-11-21: 10 mg via RECTAL
  Filled 2023-11-21: qty 1

## 2023-11-21 MED ORDER — INFLUENZA VIRUS VACC SPLIT PF (FLUZONE) 0.5 ML IM SUSY
0.5000 mL | PREFILLED_SYRINGE | INTRAMUSCULAR | Status: AC
  Administered 2023-11-23: 0.5 mL via INTRAMUSCULAR
  Filled 2023-11-21: qty 0.5

## 2023-11-21 MED ORDER — SENNA 8.6 MG PO TABS
2.0000 | ORAL_TABLET | Freq: Every day | ORAL | Status: DC
  Administered 2023-11-21: 17.2 mg via ORAL
  Filled 2023-11-21: qty 2

## 2023-11-21 MED ORDER — POLYETHYLENE GLYCOL 3350 17 G PO PACK
34.0000 g | PACK | Freq: Two times a day (BID) | ORAL | Status: AC
  Administered 2023-11-21 – 2023-11-22 (×2): 34 g via ORAL
  Filled 2023-11-21 (×2): qty 2

## 2023-11-21 MED ORDER — SORBITOL 70 % SOLN
45.0000 mL | Freq: Every day | Status: DC
  Administered 2023-11-21: 45 mL via ORAL
  Filled 2023-11-21 (×2): qty 60

## 2023-11-21 MED ORDER — IPRATROPIUM-ALBUTEROL 0.5-2.5 (3) MG/3ML IN SOLN
3.0000 mL | Freq: Four times a day (QID) | RESPIRATORY_TRACT | Status: DC
  Administered 2023-11-21 – 2023-11-22 (×4): 3 mL via RESPIRATORY_TRACT
  Filled 2023-11-21 (×3): qty 3

## 2023-11-21 MED ORDER — KETOROLAC TROMETHAMINE 15 MG/ML IJ SOLN
15.0000 mg | Freq: Once | INTRAMUSCULAR | Status: AC
  Administered 2023-11-21: 15 mg via INTRAVENOUS
  Filled 2023-11-21: qty 1

## 2023-11-21 MED ORDER — THIAMINE MONONITRATE 100 MG PO TABS
100.0000 mg | ORAL_TABLET | Freq: Every day | ORAL | Status: DC
  Administered 2023-11-21 – 2023-11-24 (×4): 100 mg via ORAL
  Filled 2023-11-21 (×4): qty 1

## 2023-11-21 MED ORDER — PHENOL 1.4 % MT LIQD
1.0000 | OROMUCOSAL | Status: DC | PRN
  Filled 2023-11-21: qty 177

## 2023-11-21 MED ORDER — AMOXICILLIN-POT CLAVULANATE 875-125 MG PO TABS
1.0000 | ORAL_TABLET | Freq: Two times a day (BID) | ORAL | Status: DC
Start: 2023-11-21 — End: 2023-11-21
  Administered 2023-11-21: 1 via ORAL
  Filled 2023-11-21: qty 1

## 2023-11-21 NOTE — Progress Notes (Addendum)
 HD#0 SUBJECTIVE:  Patient Summary: David [name] is a 49 y.o. 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   Overnight Events:   Interim History: Patient appears depressed regarding his disposition. He denies sick contacts or symptoms of URI. Has had increased sputum production and SOB as well as abdominal pain over the last few days. He did have a BM in the ED and was able to eat part of a sandwich. Discussed goals of care, he does not want a PEG and has difficulty answering questions regarding code status.   OBJECTIVE:  Vital Signs: Vitals:   11/21/23 0543 11/21/23 0700 11/21/23 0749 11/21/23 0759  BP: (!) 120/90 130/88 116/89   Pulse: 77 73 76 76  Resp: 13  20 18   Temp: 98.5 F (36.9 C)  98.6 F (37 C)   TempSrc: Oral  Oral   SpO2: 100% 100% 100% 100%   Supplemental O2: Trach collar SpO2: 100 % O2 Flow Rate (L/min): 8 L/min FiO2 (%): 35 %  There were no vitals filed for this visit.  No intake or output data in the 24 hours ending 11/21/23 0907 Net IO Since Admission: No IO data has been entered for this period [11/21/23 0907]  Physical Exam: Physical Exam Vitals reviewed.  Constitutional:      Appearance: He is not ill-appearing, toxic-appearing or diaphoretic.     Comments: Saddened and tearful Cachectic   HENT:     Nose: No congestion or rhinorrhea.     Mouth/Throat:     Pharynx: No oropharyngeal exudate.  Eyes:     Conjunctiva/sclera: Conjunctivae normal.  Neck:     Comments: Severe right torticollis Trach in place with clear, tan mucus production Cardiovascular:     Rate and Rhythm: Normal rate and regular rhythm.     Pulses: Normal pulses.      Heart sounds: Normal heart sounds.  Pulmonary:     Effort: Pulmonary effort is normal.     Breath sounds: Normal breath sounds.  Abdominal:     General: Abdomen is flat. Bowel sounds are normal.     Palpations: There is no mass.     Tenderness: There is abdominal tenderness. There is no rebound.     Hernia: No hernia is present.     Comments: Scaphoid abdomen  Skin:    General: Skin is dry.     Coloration: Skin is pale.  Neurological:     Mental Status: He is alert and oriented to person, place, and time. Mental status is at baseline.     Patient Lines/Drains/Airways Status     Active Line/Drains/Airways     Name Placement date Placement time Site Days   Peripheral IV 11/19/23 22 G Anterior;Right;Upper Arm 11/19/23  0737  Arm  2   Peripheral IV 11/20/23 22 G 1.75 Left;Anterior Forearm 11/20/23  1715  Forearm  1   Tracheostomy 7 mm Other (Comment) --  --  7 mm  --            Pertinent labs and imaging:     Latest Ref Rng & Units 11/21/2023    4:07 AM 11/20/2023    5:36 PM 11/20/2023    3:50 PM  CBC  WBC 4.0 - 10.5 K/uL 7.2  8.3   Hemoglobin 13.0 - 17.0 g/dL 89.5  89.0  9.7   Hematocrit 39.0 - 52.0 % 33.4  32.0  31.4   Platelets 150 - 400 K/uL 259   243        Latest Ref Rng & Units 11/21/2023    4:07 AM 11/21/2023   12:17 AM 11/20/2023    5:36 PM  CMP  Glucose 70 - 99 mg/dL 76  85  82   BUN 6 - 20 mg/dL 5  6  10    Creatinine 0.61 - 1.24 mg/dL 9.47  9.45  9.49   Sodium 135 - 145 mmol/L 132  132  133   Potassium 3.5 - 5.1 mmol/L 3.6  3.8  4.8   Chloride 98 - 111 mmol/L 98  96  101   CO2 22 - 32 mmol/L 22  23    Calcium 8.9 - 10.3 mg/dL 8.8  8.4    Total Protein 6.5 - 8.1 g/dL  7.2    Total Bilirubin 0.0 - 1.2 mg/dL  0.7    Alkaline Phos 38 - 126 U/L  53    AST 15 - 41 U/L  36    ALT 0 - 44 U/L  13      DG Chest Port 1 View Result Date: 11/20/2023 CLINICAL DATA:  Dyspnea 3 days intermittently. EXAM: PORTABLE CHEST 1 VIEW COMPARISON:  11/19/2023 and  11/10/2023 as well as chest CT 11/19/2023 FINDINGS: Tracheostomy tube in adequate position. Patient is rotated to the right. Lungs are adequately inflated. There is stable left apical pleural thickening. Mild interstitial prominence over the right upper lobe/apex unchanged. No focal lobar consolidation or effusion. Recently seen airspace process on CT not well visualized radiographically. Cardiomediastinal silhouette and remainder of the exam is unchanged. IMPRESSION: 1. No acute cardiopulmonary disease. 2. Stable left apical pleural thickening. Stable mild interstitial prominence over the right upper lobe/apex. Electronically Signed   By: Toribio Agreste M.D.   On: 11/20/2023 16:26    ASSESSMENT/PLAN:  Assessment: Principal Problem:   Acute hypoxic respiratory failure (HCC) Active Problems:   Laryngeal cancer (HCC)   Protein-calorie malnutrition, severe   Tracheostomy status (HCC)   Candidal endocarditis   Continuous tobacco abuse   Advanced care planning/counseling discussion   Abdominal pain   Plan: David [name] is a 48 y.o. 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    #Acute Hypoxic respiratory failure #Acute Pneumonitis  Patient is trach dependent and likely chronically aspirates. I suspect chronic aspiration and poor pulmonary toilet led to an acute pneumonitis with symptoms of SOB. CTA 10/22 shows signs of atypical infection vs inflammation, negative pulmonary embolism. Significant mucus production on exam and reports of aspirating food throughout the day. Continue amp-sulbactam to complete 10 day course. Will try to get  a sputum culture from deep trach  - Albuterol  nebs followed by hypertonic saline nebs and mucinex  - chest physiotherapy - strep pneumoniae urine antigen negative, legionella pending - procalcitonin <.10  - RVP COVID, Flu, RSV, and extended panel neg - respiratory therapy following - speech eval - scheduled duonebs every 6 hours  - Continue unasyn  EOT 10/31   #abdominal pain  Lipase, AST/ALT/alk phos  wnl, UA wnl. Diffuse TTP with bowel sounds in all 4 quadrant. KUB 10/24 c/w ileus. Likely due to severe anorexia and chronic methadone . First bowel movement in several days was yesterday in the ED. Will start with suppository and bowel regimen, if constipation worsens can try relistor  - Sorbitol solution and senna tablet and miralax   - suppository and SMOG enema -feeding supplements   #failure to thrive #protein- calorie malnutrition, severe Weight at office visit on 9/26 was 81 lbs and on admission is at 77lbs. CK normal. Severely malnourished with scaphoid abdomen, muscle wasting, and pallor on exam.  - Registered dietitian consulted for further recommendations.   - Mg and Phos in AM   #advanced care planning Patient follows outpatient with palliative care. MOST form was deferred at that office visit. Needs more GOC of discussions during this hospitalization and MOST form should be discussed. Patient willing to speak with palliative, consult is place. He adamantly refuses PEG tube despite the consequences of not eating.  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 He has completed radiation therapy to the larynx on 02/09/2021, He follows with ENT at [facility] and follows with trach clinic.   - RT following for trach care   #Chronic pain syndrome Continue home medication. He cannot refill methadone  over the weekend. He will likely be in the hospital until at least Monday. His  pregabalin  helps him a lot.  -Continue methadone  130 mg daily - Continue pregabalin  50 mg 3 times daily - [facility] pain medication ordered   #History of fungal endocarditis Follows with Dr. Overton of ID.  Per his last note on 01/07/2023, patient to be on 400 mg of fluconazole  daily, indefinitely.  -Resume home fluconazole  400 mg daily   #History of right IJ vein occlusion Patient has a past medical history of right IJ vein occlusion.  This was diagnosed on admission in 09/04/2022.  Patient encouraged to be on Eliquis  indefinitely.  - continue apixaban  5 mg BID   Best Practice: Diet: Regular diet IVF: Fluids: none, Rate: None VTE: home apixaban  Code: Full  Disposition planning: Therapy Recs: Pending, DME: other pending Family Contact: mom, to be notified. DISPO: Anticipated discharge in 2-3 days to  pending clinical improvement and safe discharge plan.  Signature:  Viktoria Charmayne [facility]  9:07 AM, 11/21/2023  On Call pager [phone]

## 2023-11-21 NOTE — Progress Notes (Signed)
 Respiratory called, patient complaining the trach oxygen  is making a noise and he has taken it off. Patient is desating to 77% when he eats. Doctor notified
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
## 2023-11-21 NOTE — Progress Notes (Signed)
 Trach supplies given to [facility] nurse (no Diplomatic Services operational officer) RN will order.

## 2023-11-21 NOTE — Progress Notes (Addendum)
 Patient just got back from Xray and wishes to eat lunch at this time. Will wait to do CPT at this time. Will come back at next scheduled time. Vitals stable. Oxygen  was dropped from 40% to 28% due to SPO2 100%.  Attempted to suction patient an was unable to obtain any specimen at this time for sputum culture. Will continue to try and will send to lab if able to obtain.

## 2023-11-21 NOTE — Evaluation (Addendum)
 Occupational Therapy Evaluation Patient Details Name: David [name] MRN: [id number] DOB: 24-Nov-1975 Today's Date: 11/21/2023   History of Present Illness   Patient is a 48 yo male presenting to the ED with poor appetite, SOB, and pain on 11/20/23. Admitted with respiratory failure. with PMH - CHF, malnutrition, Heroin use now on methadone , stage IV squamous cell carcinoma, fungal endocarditis and emergent tracheostomy currently on Diflucan , jugular venous occlusion on Eliquis  , malignant neoplasm of overlapping sites of larynx     Clinical Impressions Prior to this admission, patient living alone, with mom assisting with IADLs. Currently, patient presenting with SOB and fatigue (poor wave form on monitor despite increased attempts). Patient is CGA for ADLs, and plans to discharge home with his mom when medically stable. OT will follow acutely, with no OT needs required at discharge.      If plan is discharge home, recommend the following:   A little help with walking and/or transfers;A little help with bathing/dressing/bathroom;Assistance with cooking/housework;Assist for transportation;Help with stairs or ramp for entrance     Functional Status Assessment   Patient has had a recent decline in their functional status and demonstrates the ability to make significant improvements in function in a reasonable and predictable amount of time.     Equipment Recommendations   Hospital bed     Recommendations for Other Services         Precautions/Restrictions   Precautions Precautions: Fall;Other (comment) Recall of Precautions/Restrictions: Intact Precaution/Restrictions Comments: watch O2 Restrictions Weight Bearing Restrictions Per Provider Order: No     Mobility Bed Mobility Overal bed mobility: Modified Independent                  Transfers Overall transfer level: Needs assistance Equipment used: 1 person hand held assist Transfers: Sit to/from  Stand Sit to Stand: Contact guard assist           General transfer comment: CGA with HHA, LOB noted, but patient able to correct himself      Balance Overall balance assessment: Mild deficits observed, not formally tested                                         ADL either performed or assessed with clinical judgement   ADL Overall ADL's : Needs assistance/impaired Eating/Feeding: Set up;Sitting   Grooming: Set up;Sitting   Upper Body Bathing: Contact guard assist;Sitting   Lower Body Bathing: Contact guard assist;Sit to/from stand;Sitting/lateral leans   Upper Body Dressing : Contact guard assist;Sitting   Lower Body Dressing: Contact guard assist;Sitting/lateral leans;Sit to/from stand   Toilet Transfer: Contact guard assist;Ambulation   Toileting- Clothing Manipulation and Hygiene: Contact guard assist;Sitting/lateral lean;Sit to/from stand       Functional mobility during ADLs: Contact guard assist General ADL Comments: Prior to this admission, patient living alone, with mom assisting with IADLs. Currently, patient presenting with SOB and fatigue (poor wave form on monitor despite increased attempts). Patient is CGA for ADLs, and plans to discharge home with his mom when medically stable. OT will follow acutely, with no OT needs required at discharge.     Vision Baseline Vision/History: 1 Wears glasses Ability to See in Adequate Light: 0 Adequate Patient Visual Report: No change from baseline Vision Assessment?: Wears glasses for reading     Perception Perception: Not tested       Praxis Praxis: Not tested  Pertinent Vitals/Pain Pain Assessment Pain Assessment: No/denies pain     Extremity/Trunk Assessment Upper Extremity Assessment Upper Extremity Assessment: Defer to OT evaluation   Lower Extremity Assessment Lower Extremity Assessment: Generalized weakness   Cervical / Trunk Assessment Cervical / Trunk Assessment:   (significant neck tightness to the R with right ear close to right shoulder)   Communication Communication Communication: Impaired Factors Affecting Communication: Trach/intubated (PMV)   Cognition Arousal: Alert Behavior During Therapy: WFL for tasks assessed/performed Cognition: No apparent impairments                               Following commands: Intact       Cueing  General Comments   Cueing Techniques: Verbal cues  O2 sats on arrival 98% on 28% trach collar. Pt states he removes O2 at home when walking therefore removed the O2 from pt.  O2 sats not registering much of the session. Tried to get a new probe but not a neonatal one on unit.  Pt sats did show at 75% on RA one time during session but unsure of accuracy.  Prior to leaving room, O2 sats did register once pt back in bed at 96% on 28% trach collar.   Exercises     Shoulder Instructions      Home Living Family/patient expects to be discharged to:: Private residence Living Arrangements: Alone Available Help at Discharge: Family;Available 24 hours/day Type of Home: House Home Access: Stairs to enter [employer] of Steps: 3 Entrance Stairs-Rails: Left;Right Home Layout: Two level;Able to live on main level with bedroom/bathroom     Bathroom Shower/Tub: Producer, television/film/video: Standard     Home Equipment: Agricultural consultant (2 wheels);Crutches          Prior Functioning/Environment Prior Level of Function : Needs assist             Mobility Comments: uses his RW intermittently ADLs Comments: Independent for ADLs and cooking; mother does heavy household chores and assists with transportation as the pt does not drive. Pt enjoys playing video games.    OT Problem List: Decreased strength;Decreased range of motion;Decreased activity tolerance;Impaired balance (sitting and/or standing);Pain   OT Treatment/Interventions: Self-care/ADL training;Therapeutic  exercise;Therapeutic activities;Balance training;Patient/family education      OT Goals(Current goals can be found in the care plan section)   Acute Rehab OT Goals Patient Stated Goal: to be in less pain OT Goal Formulation: With patient/family Time For Goal Achievement: 12/05/23 Potential to Achieve Goals: Fair ADL Goals Pt Will Perform Lower Body Bathing: with modified independence;sitting/lateral leans;sit to/from stand Pt Will Perform Lower Body Dressing: with modified independence;sit to/from stand;sitting/lateral leans Pt Will Transfer to Toilet: with modified independence;ambulating;regular height toilet Pt Will Perform Toileting - Clothing Manipulation and hygiene: with modified independence;sitting/lateral leans;sit to/from stand   OT Frequency:  Min 2X/week    Co-evaluation              AM-PAC OT 6 Clicks Daily Activity     Outcome Measure Help from another person eating meals?: A Little Help from another person taking care of personal grooming?: A Little Help from another person toileting, which includes using toliet, bedpan, or urinal?: A Little Help from another person bathing (including washing, rinsing, drying)?: A Little Help from another person to put on and taking off regular upper body clothing?: A Little Help from another person to put on and taking off regular lower body  clothing?: A Little 6 Click Score: 18   End of Session Equipment Utilized During Treatment: Gait belt Nurse Communication: Mobility status  Activity Tolerance: Patient limited by fatigue Patient left: in bed;with call bell/phone within reach;with bed alarm set;with family/visitor present  OT Visit Diagnosis: Muscle weakness (generalized) (M62.81);Adult, failure to thrive (R62.7)                Time: 8582-8568 OT Time Calculation (min): 14 min Charges:  OT General Charges $OT Visit: 1 Visit OT Evaluation $OT Eval Moderate Complexity: 1 Mod  Ronal Gift E. Irmalee Riemenschneider, OTR/L Acute  Rehabilitation Services [phone]   Ronal Gift Salt 11/21/2023, 4:19 PM

## 2023-11-21 NOTE — Progress Notes (Signed)
 Respiratory therapy called, patient requested that his trach be brushed. He desat to 79 because he is refusing to wear his oxygen . The bed was lowered and he went back up to 91.

## 2023-11-21 NOTE — TOC Initial Note (Signed)
 Transition of Care [facility]) - Initial/Assessment Note    Patient Details  Name: David [name] MRN: [id number] Date of Birth: 1975/10/26  Transition [facility]) CM/SW Contact:    Lauraine FORBES Saa, LCSWA Phone Number: 11/21/2023, 4:18 PM  Clinical Narrative:                  4:18 PM CSW introduced self and role to patient. Patient consented CSW to speak to and in front of patient's mother. Patient accepted CSW offer of SDOH (food) resources. Per chart review, patient resides at home alone. Patient has a PCP and insurance. Patient does not have SNF or HH history. Patient has DME (nebulizer machine, trach supplies, rollator, tub feeds, suction) history with [employer]. [facility] [address], [facility], and [facility] [address]. TOC will continue to follow.  Expected Discharge Plan: OP Rehab Barriers to Discharge: Continued Medical Work up   Patient Goals and CMS Choice            Expected Discharge Plan and Services   Discharge Planning Services: CM Consult   Living arrangements for the past 2 months: Apartment                                      Prior Living Arrangements/Services Living arrangements for the past 2 months: Apartment Lives with:: Self Patient language and need for interpreter reviewed:: Yes        Need for Family Participation in Patient Care: Yes (Comment)   Current home services: DME Criminal Activity/Legal Involvement Pertinent to Current Situation/Hospitalization: No - Comment as needed  Activities of Daily Living   ADL Screening (condition at time of admission) Independently performs ADLs?: No Does the patient have a NEW difficulty with bathing/dressing/toileting/self-feeding that is expected to last >3 days?: Yes (Initiates electronic notice to provider for possible OT consult) (patient needs assistance in doing all adls) Does the patient have a NEW difficulty with getting in/out of bed, walking,  or climbing stairs that is expected to last >3 days?: Yes (Initiates electronic notice to provider for possible PT consult) Does the patient have a NEW difficulty with communication that is expected to last >3 days?: Yes (Initiates electronic notice to provider for possible SLP consult) Is the patient deaf or have difficulty hearing?: No Does the patient have difficulty seeing, even when wearing glasses/contacts?: No Does the patient have difficulty concentrating, remembering, or making decisions?: No  Permission Sought/Granted Permission sought to share information with : Family Supports Permission granted to share information with : Yes, Verbal Permission Granted  Share Information with NAME: Remi Lopata     Permission granted to share info w Relationship: Mother  Permission granted to share info w Contact Information: [phone]  Emotional Assessment Appearance:: Appears stated age Attitude/Demeanor/Rapport: Engaged Affect (typically observed): Appropriate, Accepting, Calm, Adaptable, Stable, Pleasant Orientation: : Oriented to Self, Oriented to Place, Oriented to  Time, Oriented to Situation Alcohol / Substance Use: Not Applicable Psych Involvement: No (comment)  Admission diagnosis:  Shortness of breath [R06.02] Dehydration [E86.0] Lactic acidosis [E87.20] Nausea [R11.0] Fatigue, unspecified type [R53.83] Cough, unspecified type [R05.9] Acute hypoxic respiratory failure (HCC) [J96.01] Patient Active Problem List   Diagnosis Date Noted   Ileus (HCC) 11/21/2023   Acute hypoxic respiratory failure (HCC) 11/20/2023   Abdominal pain 11/20/2023   Advanced care planning/counseling discussion 10/29/2023   Bronchiectasis (HCC) 10/17/2023   Normocytic Anemia  10/11/2023   S/P right hip fracture 09/25/2023   Tracheostomy in place [facility]) 09/17/2023   Severe protein-calorie malnutrition 09/16/2023   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   Closed right hip fracture (HCC)  09/15/2023   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 04/03/2023   Difficulty urinating 03/27/2023   Subclinical hypothyroidism 02/10/2023   FTT (failure to thrive) in adult 12/31/2022   Dysphagia 12/31/2022   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 12/31/2022   Hepatitis A immune 11/14/2022   End of life care 10/23/2022   Acute purulent bronchitis 10/23/2022   Hepatitis C 10/09/2022   Left leg pain 10/09/2022   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   History of pancytopenia 09/12/2022   Torticollis 07/04/2022   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   Defective dental restoration 12/31/2021   Chronic periodontitis 12/31/2021   Impacted third molar tooth 12/31/2021   Irritant contact dermatitis associated with digestive stoma 12/25/2021   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   Continuous tobacco abuse 11/21/2021   Dermatitis associated with moisture 10/16/2021   Back pain 08/16/2021   Candidal endocarditis    History of radiation to head and neck region 07/11/2021   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   Dysgeusia 07/11/2021   Pressure injury of skin 06/17/2021   Tracheostomy status (HCC)    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   Heart failure with reduced ejection fraction (HCC) 04/28/2021   Anxiety 04/28/2021   Elevated troponin 04/01/2021   Elevated transaminase level 04/01/2021   Weight loss, unintentional 01/15/2021   Protein-calorie malnutrition, severe 01/10/2021   Hypotension 01/08/2021   Chemotherapy induced nausea and vomiting 01/01/2021   Encounter for dental examination 12/13/2020   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   Teeth missing 12/04/2020   Radiation caries 12/04/2020   Retained tooth root 12/04/2020   Chronic apical periodontitis 12/04/2020   Accretions on teeth 12/04/2020   PCP:  Kandis Perkins, DO Pharmacy:   [id number] [place], [place] - [address] AT [facility]  [address] [place] [address] Phone: [phone] Fax: [phone]  [id number] [place], [place] - [address] [place] [address] Phone: [phone] Fax: [phone]  [id number] [address], [place] - [address] [place] [address] Phone: [phone] Fax: [phone]     Social Drivers of Health (SDOH) Social History: SDOH Screenings   Food Insecurity: Food Insecurity Present (11/21/2023)  Housing: Low Risk  (11/21/2023)  Transportation Needs: No Transportation Needs (11/21/2023)  Utilities: Not At Risk (11/21/2023)  Alcohol Screen: Low Risk  (03/19/2022)  Depression (PHQ2-9): Low Risk  (11/13/2022)  Recent Concern: Depression (PHQ2-9) - Medium Risk (09/12/2022)  Financial Resource Strain: Low Risk  (03/19/2022)  Physical Activity: Insufficiently Active (03/19/2022)  Social Connections: Socially Isolated (03/19/2022)  Stress: No Stress Concern Present (03/19/2022)  Tobacco Use: High Risk (11/20/2023)   SDOH Interventions:     Readmission Risk Interventions     No data to display

## 2023-11-21 NOTE — Evaluation (Signed)
 Physical Therapy Evaluation Patient Details Name: David [name] MRN: [id number] DOB: 08/03/1975 Today's Date: 11/21/2023  History of Present Illness  Patient is a 48 yo male presenting to the ED with poor appetite, SOB, and pain on 11/20/23. Admitted with respiratory failure. 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 Pt with deficits in balance with gait.  Pt needed min assist for safety without RW.  Pt has RW he can use and is going home with his mother.  Recommend Outpt PT balance program.  Would benefit from hospital bed given his issues with swallowing and posture was well as incr risk of skin breakdown.  Also may need home O2.  Did not appear the monitor was registering well today.  Will follow acutely.  Pt currently with functional limitations due to the deficits listed below (see PT Problem List). Pt will benefit from acute skilled PT to increase their independence and safety with mobility to allow discharge.           If plan is discharge home, recommend the following: A little help with walking and/or transfers;A little help with bathing/dressing/bathroom;Assistance with cooking/housework;Assist for transportation;Help with stairs or ramp for entrance   Can travel by private vehicle        Equipment Recommendations Hospital bed (? home O2)  Recommendations for Other Services       Functional Status Assessment Patient has had a recent decline in their functional status and demonstrates the ability to make significant improvements in function in a reasonable and predictable amount of time.     Precautions / Restrictions Precautions Precautions: Fall;Other (comment) Recall of Precautions/Restrictions:  Intact Precaution/Restrictions Comments: watch O2 Restrictions Weight Bearing Restrictions Per Provider Order: No      Mobility  Bed Mobility Overal bed mobility: Modified Independent                  Transfers Overall transfer level: Needs assistance Equipment used: 1 person hand held assist Transfers: Sit to/from Stand Sit to Stand: Contact guard assist           General transfer comment: CGA with HHA, LOB noted, but patient able to correct himself    Ambulation/Gait Ambulation/Gait assistance: Min assist Gait Distance (Feet): 15 Feet Assistive device: 1 person hand held assist, None Gait Pattern/deviations: Step-through pattern, Decreased stride length, Staggering left, Drifts right/left   Gait velocity interpretation: <1.31 ft/sec, indicative of household ambulator   General Gait Details: Pt with general instability without device.  Pt with definite need of UE support for balance and cannot be challenged without device or loses balance. Pt aware he needs to use RW at home for safety.  Stairs            Wheelchair Mobility     Tilt Bed    Modified Rankin (Stroke Patients Only)       Balance Overall balance assessment: Mild deficits observed, not formally tested                                           Pertinent Vitals/Pain Pain Assessment Pain Assessment: No/denies pain    Home Living Family/patient expects to be discharged to:: Private residence Living  Arrangements: Alone Available Help at Discharge: Family;Available 24 hours/day Type of Home: House Home Access: Stairs to enter Entrance Stairs-Rails: Lawyer of Steps: 3   Home Layout: Two level;Able to live on main level with bedroom/bathroom Home Equipment: Rolling Walker (2 wheels);Crutches      Prior Function Prior Level of Function : Needs assist             Mobility Comments: uses his RW intermittently ADLs Comments:  Independent for ADLs and cooking; mother does heavy household chores and assists with transportation as the pt does not drive. Pt enjoys playing video games.     Extremity/Trunk Assessment   Upper Extremity Assessment Upper Extremity Assessment: Defer to OT evaluation    Lower Extremity Assessment Lower Extremity Assessment: Generalized weakness    Cervical / Trunk Assessment Cervical / Trunk Assessment:  (significant neck tightness to the R with right ear close to right shoulder)  Communication   Communication Communication: Impaired Factors Affecting Communication: Trach/intubated (PMV)    Cognition Arousal: Alert Behavior During Therapy: WFL for tasks assessed/performed                             Following commands: Intact       Cueing Cueing Techniques: Verbal cues     General Comments General comments (skin integrity, edema, etc.): O2 sats on arrival 98% on 28% trach collar. Pt states he removes O2 at home when walking therefore removed the O2 from pt.  O2 sats not registering much of the session. Tried to get a new probe but not a neonatal one on unit.  Pt sats did show at 75% on RA one time during session but unsure of accuracy.  Prior to leaving room, O2 sats did register once pt back in bed at 96% on 28% trach collar.    Exercises     Assessment/Plan    PT Assessment Patient needs continued PT services  PT Problem List Decreased activity tolerance;Decreased balance;Decreased mobility;Decreased knowledge of use of DME;Decreased safety awareness;Decreased knowledge of precautions;Cardiopulmonary status limiting activity       PT Treatment Interventions DME instruction;Gait training;Functional mobility training;Therapeutic activities;Therapeutic exercise;Balance training;Patient/family education    PT Goals (Current goals can be found in the Care Plan section)  Acute Rehab PT Goals Patient Stated Goal: to go home with mother PT Goal Formulation:  With patient Time For Goal Achievement: 12/05/23 Potential to Achieve Goals: Good    Frequency Min 2X/week     Co-evaluation               AM-PAC PT 6 Clicks Mobility  Outcome Measure Help needed turning from your back to your side while in a flat bed without using bedrails?: None Help needed moving from lying on your back to sitting on the side of a flat bed without using bedrails?: None Help needed moving to and from a bed to a chair (including a wheelchair)?: A Little Help needed standing up from a chair using your arms (e.g., wheelchair or bedside chair)?: A Little Help needed to walk in hospital room?: Total Help needed climbing 3-5 steps with a railing? : Total 6 Click Score: 16    End of Session Equipment Utilized During Treatment: Gait belt;Oxygen  Activity Tolerance: Patient limited by fatigue Patient left: in bed;with call bell/phone within reach;with bed alarm set;with family/visitor present Nurse Communication: Mobility status PT Visit Diagnosis: Muscle weakness (generalized) (M62.81)    Time: 8567-8554 PT Time  Calculation (min) (ACUTE ONLY): 13 min   Charges:   PT Evaluation $PT Eval Moderate Complexity: 1 Mod   PT General Charges $$ ACUTE PT VISIT: 1 Visit         Aylla Huffine M,PT Acute Rehab Services [phone]   Stephane JULIANNA Bevel 11/21/2023, 4:18 PM

## 2023-11-21 NOTE — Progress Notes (Signed)
 Patient refusing to wear his oxygen , has cough with thick secretion. Mother in room, suppository and sorbitol given to help with having a BM. Sat 96%

## 2023-11-21 NOTE — Evaluation (Addendum)
 Clinical/Bedside Swallow Evaluation Patient Details  Name: David [name] MRN: [id number] Date of Birth: October 07, 1975  Today's Date: 11/21/2023 Time: SLP Start Time (ACUTE ONLY): 9050 SLP Stop Time (ACUTE ONLY): 1023 SLP Time Calculation (min) (ACUTE ONLY): 34 min  Past Medical History:  Past Medical History:  Diagnosis Date   Acute respiratory failure with hypoxia (HCC)    AKI (acute kidney injury) 01/08/2021   Aspiration pneumonia of right lower lobe (HCC) 06/06/2021   Bacterial endocarditis    Chronic HFrEF (heart failure with reduced ejection fraction) (HCC) 06/09/2021   Community acquired pneumonia due to Pneumococcus 04/01/2021   Evaluation by medical service required 03/19/2022   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HCC)    Leaking PEG tube (HCC) 12/12/2021   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)    Pneumococcal bacteremia 06/09/2021   Pneumothorax    Left lung spontaneous pneumothorax at age 84 yr    S/P percutaneous endoscopic gastrostomy (PEG) tube placement (HCC) 06/24/2021   Past Surgical History:  Past Surgical History:  Procedure Laterality Date   chest tubes Left    IR GASTROSTOMY TUBE MOD SED  06/18/2021   LAPAROSCOPIC INSERTION GASTROSTOMY TUBE N/A 06/20/2021   Procedure: LAPAROSCOPIC INSERTION GASTROSTOMY TUBE;  Surgeon: Signe Mitzie LABOR, MD;  Location: [facility] OR;  Service: General;  Laterality: N/A;  DOW PATIENT   TOTAL HIP ARTHROPLASTY Right 09/17/2023   Procedure: ARTHROPLASTY, HIP, TOTAL, ANTERIOR APPROACH;  Surgeon: Kendal Franky SQUIBB, MD;  Location: [facility] OR;  Service: Orthopedics;  Laterality: Right;   TRACHEOSTOMY TUBE PLACEMENT N/A 06/09/2021   Procedure: AWAKE TRACHEOSTOMY;  Surgeon: Carlie Clark, MD;  Location[facility] OR;  Service: ENT;  Laterality: N/A;   HPI:  David [name] is a 48 yo male with hx of stage IVB laryngeal cancer admitted with shortness of breath and abdominal pain. Endorses active smoking. CTA shows scattered areas of scarring,  architectural distortion, and volume loss within the R lung compatible with sequelae of chronic inflammation or infection. He is well-known to SLP service at [facility] and [facility]. He is s/p chemo and XRT. 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 Neck contraction has worsened over the last year to the point where he tried OP PT but with little benefit.  He is followed by Jeralyn Banner, PCCM, at trach clinic, who has had to make significant manipulations/adaptations to his tracheostomy to avoid skin breakdown from the flanges.  He is prone to mucous plugging. Uses a PMV. Not a candidate for decannulation. Hx trach 06/09/21, PEG 06/20/21, subsequently fell out per his report. 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 Quality of imaging was compromised by shoulder/neck positioning and it was difficult to visualize anatomical markers. There were no incidents of nasal regurgitation on today's study (reported to be happening more frequently). There was minimal mobility of the larynx and incomplete laryngeal closure, leading to intermittent aspiration of thin and nectar thick liquids, occurring before and after the swallow response. There was frequent tongue pumping and effort to propel material from mouth through pharynx, leaving residuals in posterior oral cavity with multiple f/u swallows needed to move bolus into throat. There was minimal pharyngeal residue post-swallow. OP SLP to tx dysphagia from 9/23-12/23 and again from 01/14/23-03/04/23. He was managing to eat regular solids/thin liquids. Pt has adamantly declined replacement  of PEG in the past, though acknowledges that he continues to lose weight.    Assessment / Plan / Recommendation  Clinical Impression  Continue  current diet of soft solids and thin liquids, meds crushed in puree. 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 Recommend PMT consult for onging discussions regarding GOC. Discussed with RD and MD, will continue following.  Pt is deconditioned, stating he has experienced a steep decline in function over the past week. We discussed his swallowing and the effort it requires, which is also impacted by frequent coughing, nasal regurgitation, and overall discomfort. His meals are primarily limited to pasta and protein supplements. Sips of water  require multiple swallows that only intermittently reduce hydrophonia. Coughing follows after a significant delay. This is similar with soft solids, though mastication is also prolonged and he has to expectorate residuals. Pt describes his previous experience with PEG and his hesitance for its replacement, though also acknowledges that he is expending substantial energy to eat/drink even small amounts and continues to lose weight.  SLP Visit Diagnosis: Dysphagia, unspecified (R13.10)    Aspiration Risk  Moderate aspiration risk    Diet Recommendation Regular;Thin liquid    Liquid Administration via: Cup;Straw Medication Administration: Crushed with puree Supervision: Staff to assist with self feeding;Full supervision/cueing for compensatory strategies Compensations: Minimize environmental distractions;Slow rate;Small sips/bites Postural Changes: Seated upright at 90 degrees    Other  Recommendations Oral Care Recommendations: Oral care BID;Oral care before and after PO     Assistance Recommended at Discharge    Functional Status Assessment Patient has had a recent decline in their functional status and demonstrates the ability to make significant improvements in function in a reasonable and predictable amount of time.  Frequency and Duration min 2x/week  2 weeks       Prognosis Prognosis for  improved oropharyngeal function: Fair Barriers to Reach Goals: Time post onset;Severity of deficits      Swallow Study   General HPI: Hubbard Seldon is a 48 yo male with hx of stage IVB laryngeal cancer admitted with shortness of breath and abdominal pain. Endorses active smoking. CTA shows scattered areas of scarring, architectural distortion, and volume loss within the R lung compatible with sequelae of chronic inflammation or infection. He is well-known to SLP service at [facility] and [facility]. He is s/p chemo and XRT. 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 Neck contraction has worsened over the last year to the point where he tried OP PT but with little benefit.  He is followed by Jeralyn Banner, PCCM, at trach clinic, who has had to make significant manipulations/adaptations to his tracheostomy to avoid skin breakdown from the flanges.  He is prone to mucous plugging. Uses a PMV. Not a candidate for decannulation. Hx trach 06/09/21, PEG 06/20/21, subsequently fell out per his report. 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 Quality of imaging was compromised by shoulder/neck positioning and it was difficult to visualize anatomical markers. There were no incidents of nasal regurgitation on today's study (reported to be happening more frequently). There was minimal mobility of the larynx and incomplete laryngeal closure, leading to intermittent aspiration of thin and nectar thick liquids, occurring before and after the swallow response. There was frequent tongue  pumping and effort to propel material from mouth through pharynx, leaving residuals in posterior oral cavity with multiple f/u swallows needed to move bolus into throat. There was minimal pharyngeal  residue post-swallow. OP SLP to tx dysphagia from 9/23-12/23 and again from 01/14/23-03/04/23. He was managing to eat regular solids/thin liquids. Pt has adamantly declined replacement of PEG in the past, though acknowledges that he continues to lose weight. Type of Study: Bedside Swallow Evaluation Previous Swallow Assessment: see HPI Diet Prior to this Study: Regular;Thin liquids (Level 0) Temperature Spikes Noted: No Respiratory Status: Trach Collar History of Recent Intubation: No Behavior/Cognition: Alert;Cooperative Oral Care Completed by SLP: Yes Oral Cavity - Dentition: Edentulous Vision: Functional for self-feeding Self-Feeding Abilities: Needs assist Patient Positioning: Upright in bed Volitional Cough: Weak Volitional Swallow: Able to elicit    Oral/Motor/Sensory Function Overall Oral Motor/Sensory Function: Within functional limits   Ice Chips Ice chips: Not tested   Thin Liquid Thin Liquid: Impaired Presentation: Straw Pharyngeal  Phase Impairments: Multiple swallows;Wet Vocal Quality;Cough - Delayed    Nectar Thick Nectar Thick Liquid: Not tested   Honey Thick Honey Thick Liquid: Not tested   Puree Puree: Not tested   Solid     Solid: Impaired Presentation: Spoon Oral Phase Impairments: Impaired mastication Pharyngeal Phase Impairments: Multiple swallows;Wet Vocal Quality;Cough - Delayed      Damien Blumenthal, M.A., CCC-SLP Speech Language Pathology, Acute Rehabilitation Services  Secure Chat preferred [phone]  11/21/2023,11:17 AM

## 2023-11-21 NOTE — Discharge Instructions (Addendum)
 Thank you for allowing us  to be part of your care. You were hospitalized for shortness of breath and pneumonitis. We treated you with antibiotics and breathing treatments.  See the changes in your medications and management of your chronic conditions below:  *For your pneumonitis -Please continue the antibiotic, Augmentin , to complete a total of 10 days. You'll take one tablet twice a day. The last day you'll take it is 11/28/2023.  -Please continue using the nebulizers. Make sure to use the albuterol  before the hypertonic saline to avoid irritation.  -Please continue using caution when eating, as you are at risk of aspiration.   FOLLOW UP APPOINTMENTS: We made hospital follow up appointment for your at your primary care office for 11/3.   Please continue your outpatient physical therapy.   Please call your PCP or our clinic if you have any questions or concerns, we may be able to help and keep you from a long and expensive emergency room wait. Our clinic and after hours phone number is [phone]. The best time to call is Monday through Friday 9 am to 4 pm but there is always someone available 24/7 if you have an emergency. If you need medication refills please notify your pharmacy one week in advance and they will send us  a request.   We are glad you are feeling better,  David [name] Internal Medicine Inpatient Teaching Service at [facility]   Information on my medicine - ELIQUIS  (apixaban )  This medication education was reviewed with me or my healthcare representative as part of my discharge preparation.    Why was Eliquis  prescribed for you? Eliquis  was prescribed to treat blood clots that may have been found in the veins of your legs (deep vein thrombosis) or in your lungs (pulmonary embolism) and to reduce the risk of them occurring again.  What do You need to know about Eliquis  ? Continue Eliquis  5 mg tablet taken TWICE daily.  Eliquis  may be taken with or without food.    Try to take the dose about the same time in the morning and in the evening. If you have difficulty swallowing the tablet whole please discuss with your pharmacist how to take the medication safely.  Take Eliquis  exactly as prescribed and DO NOT stop taking Eliquis  without talking to the doctor who prescribed the medication.  Stopping may increase your risk of developing a new blood clot.  Refill your prescription before you run out.  After discharge, you should have regular check-up appointments with your healthcare provider that is prescribing your Eliquis .    What do you do if you miss a dose? If a dose of ELIQUIS  is not taken at the scheduled time, take it as soon as possible on the same day and twice-daily administration should be resumed. The dose should not be doubled to make up for a missed dose.  Important Safety Information A possible side effect of Eliquis  is bleeding. You should call your healthcare provider right away if you experience any of the following: Bleeding from an injury or your nose that does not stop. Unusual colored urine (red or dark brown) or unusual colored stools (red or black). Unusual bruising for unknown reasons. A serious fall or if you hit your head (even if there is no bleeding).  Some medicines may interact with Eliquis  and might increase your risk of bleeding or clotting while on Eliquis . To help avoid this, consult your healthcare provider or pharmacist prior to using any new prescription or non-prescription  medications, including herbals, vitamins, non-steroidal anti-inflammatory drugs (NSAIDs) and supplements.  This website has more information on Eliquis  (apixaban ): [email or web address] David [name]  David [name] David [name] David [name] David [name] Address: [address] [place], [place] [address] Phone Number: [phone] Hours of Operation: Residents of [place] can come to obtain food Monday through Friday from 8:30am until 3:30pm.  Photo ID and Social Security cards required for all residents of a household. Can come six times a year  [facility] Address: [address] [place], [place] [address] Phone Number: [phone] Hours of Operation: Operates Tuesday-Friday 10:00 a.m. to 1 p.m. Requirements: Referral from DSS needed. May come 6 times a year, 30 days apart. Photo ID and SS required for all residents of household.  [facility] Address: [address] [place], [place] [address] Phone Number: [phone] Hours of Operation: Food pantry is open on the last Saturday of each month from 10:00 am - 12:00 noon. No appointment needed. No qualifications.  [facility] Address: [address] [place], [place] [address] Phone Number: [phone]. [phone] Hours of Operation: Must make reservations to pick up food on Saturdays. Sign ups for Saturday pick up beginning at 8:30 a.m. on Monday morning.  [facility] Address: [address] [place], [place] [address] Phone Number: [phone] Hours of Operation: If you need food, bring proper identification such as a driver's license to receive a bag of food once a month. Requirements: Can come once every 30 days with referral DSS, Holiday Representative, Mental health etc. Each referral good for six visits. Photo ID required. *1st visit no referral required.  [facility] Address: [address] [place], [place] [address] Phone Number: [phone]  [place] [facility] Address: [address] [place], [place] [address] Phone Number: [phone] Hours of Operation:  You can register at [email or web address] for free groceries  [facility] Address: [address] [name], [place] [address] Phone Number: [phone] Hours of Operation: Drive through giveaway, first come first served. Every 3rd Saturday 11AM - 1PM  [facility] [address] Address: [address] [place] [address] Phone Number: [phone]   [place]  [facility] Address: [address] [place], [place] [address] Phone Number: [phone] Hours of Operation: Once a month every 3rd Saturday  [facility] Address: [address] [place], [place] [address] Phone Number: [phone] Hours of Operation: Distribution happens from 9:00-10:00 a.m. every Saturday.     [facility] Address: [address] [place], [place] [address] Phone Number: [phone] Hours of Operation: ONCE a week for the community food distribution held every Tuesday, Wednesday and Thursday from 11 a.m. - 2:00 p.m. Food is available on a first come, first serve basis and varies week to week. No appointment necessary for drive thru pick up.  [facility] Address: [address] [place], [place] [address] Phone Number: [phone] Hours of Operation: Open every 3rd Thursday 9:30 a.m. - 11:00 a.m.  [facility] Address: [address] [place], [place] [address] Phone Number: [phone] Hours of Operation: Please call for hours, directions, and questions  [facility] [place] [facility] Address: [address] [place], [place]  [address] Phone Number: [phone] Website: https://www.hollyguns.co.za Food Finder app: https://findfood.ghpfa.org  [employer] Address: [address] [place], [place] [address] Phone Number: [phone] Hours of Operation: Contact Bree Harpe. Enrolled Substance Abuse Clients Only  [facility] Address: [address] [place] [place], [address]  Phone Number: [phone] Hours of Operation: Contact Bartley Irving. Food pantry open the 3rd Saturday of each month from 9 a.m. -12 p.m. only  [place] [facility] Address: [address] [place], [place] [address] Phone Number: [phone] Hours of Operation: Contact Geni Lee. Emergency food bank open on Saturdays by appointment only  [facility] Address: [address] [place], [place] [address] Phone Number: [phone] Hours of Operation: No specific contact person; Anyone can help  [facility] Address: [address] [place], [place] [address] Phone Number: [phone] Hours of Operation: Contact [name] Molt. Agency gives out a bag of food every Thursday from 2-4 p.m. only, and also provides a community meal every Thursday between 5-6 p.m. Other services provided include rent/mortgage and utility assistance, women's winter shelter, thrift store, and senior adult activities.  [facility] [place] Address: [address] [place], [place] [address] Phone Number: [phone] Hours of Operation: The Emergency Food Assistance Program provides individuals and families with a generous supply of food including meat, fresh vegetables, and nonperishable items. The food box contains five days' worth of food, and each family or individual can receive a box once per month. M, W, Th, Fr 11am-2pm, walk-ins welcome.  [facility] Address: [address] [place], [place] [address]  Phone Number: [phone] Hours of Operation: Contact Asia Cathlean. Tuesdays and Thursdays from 11am - 3pm by appointment only  Sunday, by APPOINTMENT ONLY  [address] of [place], [address] [place], [address], [phone], 3.2 mi from [facility], call in advance for appointment at 10:00am or at 4:00pm, must provide valid photo ID  Monday  9:30am-5:00pm [facility], [address] [place] [phone], [phone], 0.9 mi from [facility], can come four times per year, bring your photo ID and SS cards for other residents of household, will make appointments for those who work and need to come after 5pm  10:00am-12:00noon [employer], [address] [place]  [place], [address], [phone], 1.7 mi from [facility], can come once every 60  days per household, need referral from DSS, [employer], etc., bring photo ID and SS card   10:00am-1:00pm [facility],  [address] [place], [address], [phone], 7.7 mi from [facility], can come once every thirty days with a referral from DSS, [employer], Mental Health, etc. -- each referral good for six visits, bring photo ID   10:00am-1:00pm [address] [address], [phone], 4.2 mi from [facility], can come once every 6 months, open to [facility] residents, bring photo ID and copy of a current utility bill in your name, please call first to verify that food is available  6:30pm-8:30pm [facility], [address], [address], [phone], 3.2 mi from [facility], can come once every 30 days, maximum 6 times per year, bring your photo ID and SS numbers for other residents of household  Monday by APPOINTMENT ONLY  [facility], [address] [place], [address],  [phone], 2.5 mi from [facility], call in advance for appointment between 10:00am-2:00pm, bring your photo ID and SS cards for all residents of household, can come once every 3 months  [facility], [address], [address], [phone], 0.7 mi from [facility], call in advance for appointment, can come once every 30 days, bring your photo ID and SS cards for other residents of household   Tuesday  9:00am-12:00noon [employer], [address], [address], [phone], 1.3 mi from [facility], can come once every 3 months, bring your photo ID and SS numbers for other residents of household   9:00am-1:00pm [facility] [address]  [place], [address], [phone], 1.6 mi from [facility], can come once every two weeks  9:30am-5:00pm [employer], [address] [place] [phone], [phone], 0.9 mi from [facility], can come four times per year, bring  your photo ID and SS cards for other residents of household, will make appointments for those who work and need to come after 5pm  10:00am-12:00noon [employer], [address] [place]  [place], [address], [phone], 1.7 mi from [facility], can come once every 60 days per household, need referral from DSS, [employer], etc., bring photo ID and SS card  10:00am-1:00pm [facility], [id number],  [phone], 3.8 mi from [facility], with referral from DSS, may come six times, 30 days apart, bring your photo ID and SS cards for all residents of household   10:00am-1:00pm [address] the [facility], [address] [place], [address], [phone], 7.7 mi from [facility], can come once every thirty days with a referral from DSS, [employer], Mental Health, etc.- each referral good for six visits, bring photo ID   10:00am-1:00pm [address], [address] [address], [phone], 4.2 mi from [facility], can come once every 6 months, open to [facility] residents, bring photo ID and copy of a current utility bill in your name, please call first to verify that food is available  2:00pm-3:30pm [facility], [address], [phone], [phone], 3.7 mi from [facility], can come twelve times per year, one bag per family, bring photo ID   FIRST AND THIRD Tuesdays  10:00am-1:00pm, [address], [address] [place], [address], [phone], 7.2 mi from [facility], can come once every 30 days   Tuesday, WHEN FOOD IS AVAILABLE (call)  12:00noon-2:00pm [place] [facility], [address], [address], [phone], 1.5 mi from  [facility], can come once every 30 days, bring photo ID   Tuesday, by APPOINTMENT ONLY  [facility], [address] [place], [address],  [phone], 2.5 mi from [facility], call  in advance for appointment between 1:00pm-4:00pm, bring your photo ID and SS cards for all residents of household, can come once every 3 months   [address] of [place], [address], [address], [phone], 5 mi from [facility], call one day ahead for appointment the next day between 10:00am and 12:00noon, can come once every 3 months, bring your photo ID and must qualify according to family income   [facility], [address], [address], [phone], 0.7 mi from [facility], call in advance for appointment, can come once every 30 days, bring your photo ID and SS cards for other residents of household   [address] of [place], [place], [address],  [phone], 5.1 mi from [facility], call between 9:00am and 1:00pm M-F to make  appointment. Appointments are scheduled for Tues and Thurs from 10:00am-11:30am, bring photo ID, can come once every 6 months, limit three visits over 18 months, then must have referral  Wednesday  9:30am-5:00pm [facility], [address] [place] [phone], [phone], 0.9 mi from [facility], can come four times per year, bring your photo ID and SS cards for other residents of household, will make appointments for those who work and need to come after 5pm  9:30am-11:30am [employer] [facility], [address], [address], [phone], 6.6 mi from [facility], can come once every 30 days, bring your photo ID, and SS cards for other residents of household, each monthly visit requires a written referral from GUM or DSS with number in household on form  10:00am-12:00noon [address] [place]  [place], [address], [phone], 1.7 mi from [facility], can come once every 60 days per household, need referral from DSS, [employer], etc., bring photo ID and SS card  10:00am-1:00pm [facility], [id number],  [phone], 3.9 mi from [facility], with referral from DSS, may come six times, 30 days apart, bring your photo ID and SS cards for all residents of household   10:00am-1:00pm [address] the [facility], [address] [place], [address], [phone], 7.7 mi from [facility], can come once every thirty days with a referral from DSS, [employer], Mental Health, etc. - each referral good for six visits, bring photo ID   2:00pm-5:45pm [address], [address] [place], [address], [phone], 3.2 mi from [facility], once every 30 days, first come/first served, limited to first 25, bring photo ID   6:30pm-8:30pm [facility], [address] [address], [phone], 3.2 mi from [facility], can come once every 30 days, maximum 6 times per year, bring your photo ID and SS numbers for other residents of household  FIRST and THIRD Wednesdays   9:00am-12:00noon, [facility], [address] [place], [address], [phone], 4.2 mi from [facility], can come once a month. Please arrive and sign in no later than 11:15 so everyone can be served by 12 noon.  THIRD Wednesday  1:30pm-3:00pm, [place] [place], [address], [phone] or [phone], 2.1 mi from [facility]  Wednesday, by APPOINTMENT ONLY  [address] [facility] [place], [address] [address], [phone], 5 mi from [facility], call one day ahead for appointment the next day between 10:00am and 12:00noon, can come once every 3 months, bring your photo ID and must qualify according to family income   [facility], [address] [address], [phone], 0.7 mi from [facility], call in advance for appointment, can come once every 30 days, bring your photo ID and SS cards for other residents of household   [address] of [place], [address] [place], [address], [phone], 3.2 mi from [facility], call in advance for appointment at 7:00pm, must  provide valid photo ID  Thursday  9:00am-12:00noon [employer], [address] [address], [phone], 1.3 mi from [facility], can come once every 3 months, bring your photo ID and SS numbers for other residents of household   9:00am-1:00pm [facility] [address],  [place], [address], [phone], 1.6 mi from [place], can come once every two weeks  9:30am-12:00noon [facility], [address], [address], [phone], 4.5 mi from [facility], can come once every 30 days, must state income [closed Thanksgiving and week of Christmas]  9:30am-5:00pm [employer], [address] [place] [phone], [phone], 0.9 mi from [facility], can come four times per year, bring your photo ID and SS card for other residents of household, will make appointments for those who work and need to come after 5pm  10:00am-1:00pm [facility], [address] [place], [id number],  [phone], 3.9 mi from [facility], with referral from DSS, may come six times, 30 days apart, bring your photo ID and SS cards for all residents of household   10:00am-1:00pm [address] the [facility], [address] [place], [address], [phone], 7.7 mi from [facility], can come once every thirty days with a referral from DSS, [employer], Mental Health, etc.- each referral good for six visits, bring photo ID   10:00am-1:00pm [facility], [address] [address], [phone], 4.2 mi from [facility], can come once every 6 months, open to [facility] residents, bring photo ID and copy of a current utility bill in your name, please call first to verify that food is available   SUNDAYS BREAKFAST TWO LOCATIONS: 8:00am served in [facility] by [employer] 8:30am SHUTTLE provided from [facility], served at [employer], [address] LUNCH TWO LOCATIONS [plus  one additional third Sunday only] 10:30am - 12:30pm served at [employer], [employer], [place] [address] (1.2 miles from [facility]) 12:30pm served in [place] by [employer] Team (THIRD Sunday only) 1:30pm served at [facility] by [facility] one location [plus one additional third Sunday only] 5:00pm Every Sunday, served under the bridge at [address] by Dovie Under [employer] (.7 miles from [facility]) (THIRD Sunday ONLY) 4:00pm served in the parking garage, across from [employer], corner of [place] and [place] by [employer] MONDAYS BREAKFAST 7:30am served in [employer] by the [employer] and Friends LUNCH 10:30am - 12:30pm served at [employer], [employer], [place] [address] (1.2 miles from [facility]) DINNER TWO LOCATIONS: 7:00pm served in front of the courthouse at the corner of [employer] and [employer]. by [place] Monday Night Meal (3 blocks from [facility]) 4:30pm served at the [employer], [address] by [employer] [place] [employer] (0.6 miles from [facility]) TUESDAYS BREAKFAST 8:00am - 9:00am served at [employer], [address] (0.3 miles from [place]) LUNCH 10:30am - 12:30pm served at the [employer], [employer] [address] (1.2 miles from [place]) DINNER 6:00pm served at [employer], enter from [employer] and go to the [employer], (0.7 miles from [place]) [facility] BREAKFAST 7:00am - 8:00am served at [employer], [employer] [address] (1.2 miles from [place]) LUNCH ONE LOCATION [plus two additional locations listed below] 10:30am - 12:30pm served at [employer], [employer] [address] (1.2 miles from [place]) (FIRST Wednesday ONLY) 11:30am  served at [employer], [place] [address] (6.6 miles from [facility]) (  SECOND Wednesday ONLY) 11:00am served at [employer].r. Donnelley. [employer] of [address], [address] (1.3 miles from [place]) [place] TWO LOCATIONS 6:00pm served at [employer]. [employer], [place] [address] [employer]. (1.3 miles from [facility]) 4:00pm - 6:00pm (hot dogs and chips) served at [employer] of [facility], [address] (1.7 miles from [place]) [place] BREAKFAST NOT AVAILABLE AT THIS TIME LUNCH 10:30am - 12:30pm served at [employer], [employer], [place] [address] (1.2 miles from [place]) DINNER 6:00pm served at [employer], enter from [employer] and go to the [employer], (0.7 miles from [employer]) [place] BREAKFAST NOT AVAILABLE AT THIS TIME LUNCH 10:30am - 12:30pm served at [employer], [employer] [address], (1.2 miles from [place]) DINNER TWO LOCATIONS, [plus one additional first Friday only] 6:00pm served under the bridge at [address] by Dovie Under [employer]. (.7 miles from [facility]) 5:00pm - 7:00pm served at [employer] of [facility], [address] (1.7 miles from [place]) (FIRST Friday ONLY) 5:45 pm - SHUTTLE provided from the LIBRARY at 5:45pm. Served at [facility], [address]. SATURDAYS BREAKFAST TWO LOCATIONS [plus one additional last Saturday only] 8:00am served at [facility] by [employer] 8:30am served at [employer], [address] (2.2 miles from [facility]) (LAST Saturday ONLY) 8:30am served at [employer], [address] (5 miles from [place]) LUNCH 10:30am - 12:30pm served at [employer], [employer] [address] (1.2 miles from [facility]) DINNER 6:00pm served under the bridge at [address] by [employer] (0.7 miles from [employer])  DIRECTIONS FROM [place] TO ALL MEAL LOCATIONS The Bridge at [address] (.7 miles from [address]) [address] on [place]. Turn Right onto [employer]v 433 ft. Continue onto [address] under bridge, about 500 ft. [place] (3 blocks from [facility]) South on [address]. Turn right on [place]  1 block to [employer] (.5 miles from [place]) [place] on [place]. Yrc Worldwide. past [employer] to [employer]. Enter from [employer] and go to the [employer] building [employer]. [employer] [address]. (1.3 miles from [facility]) [address] on [place]. Turn Right onto [address] church will be on the Left. The [employer] [address] (.3 miles from [facility]) Go .3 miles on [address] Destination is on your right [employer] at [place] (6.6 miles from [address]) [address] on [place] toward [address] Turn right onto [address] Continue onto Al[employer]a Inc. Continue onto Toll [employer]. 5. Tom[place] is on right [employer]) [address] [place]  [address] (.6 miles from [place]) [employer] on [place]. [address] Turn Left onto [address] [place]  [address] 0.3 miles Destination is on the Left. [place] [employer] at [employer] (5 miles from [employer]) 1. Head south on [address]. Turn right onto [address] Turn slightly left onto [employer] Continue onto [employer] Turn right at [employer] Continue to church on right [employer] of [place]  [facility] [address] (1.7 miles from [facility]) [address] on [place] 1.4 miles [place] becomes [place]. [address] Continue 0.6 miles and church will be on theright. [place] [employer] at [address] (2.5 miles from  [employer]) [place] provided from [employer] [place]] [place] on [place]. Elm toward [employer] right onto [employer]ale left onto [employer] Turn left onto [employer] [address] [place]  [address] (2.2 miles from [address]) [address] on [place] 1.4 miles [place] becomes [place]. Elm [address] Turn right onto [address] [place]  [address] and church will be on the Left. [employer]/[place] [employer] [address] (1.2 miles from [employer]) 1.Turn right onto [facility] 2.Turn left onto GEANNIE Beagle 3.Micheline MICAEL Ruth 4.Destination is on your right [place]. Garnette Ava Blackwood of [address] at [place] (1.3 miles from [facility]) [address] on [address] Turn left onto [employer] right onto [address] Continue onto [employer] [place]. Turn left onto [employer]. Turn right onto [employer].

## 2023-11-21 NOTE — Progress Notes (Signed)
 Refused chest vest at this time, he is currently eating lunch

## 2023-11-22 DIAGNOSIS — R1319 Other dysphagia: Secondary | ICD-10-CM | POA: Diagnosis not present

## 2023-11-22 DIAGNOSIS — F17210 Nicotine dependence, cigarettes, uncomplicated: Secondary | ICD-10-CM | POA: Diagnosis present

## 2023-11-22 DIAGNOSIS — E860 Dehydration: Secondary | ICD-10-CM | POA: Diagnosis present

## 2023-11-22 DIAGNOSIS — J439 Emphysema, unspecified: Secondary | ICD-10-CM | POA: Diagnosis present

## 2023-11-22 DIAGNOSIS — Z8521 Personal history of malignant neoplasm of larynx: Secondary | ICD-10-CM | POA: Diagnosis not present

## 2023-11-22 DIAGNOSIS — R059 Cough, unspecified: Secondary | ICD-10-CM | POA: Diagnosis not present

## 2023-11-22 DIAGNOSIS — I5032 Chronic diastolic (congestive) heart failure: Secondary | ICD-10-CM | POA: Diagnosis present

## 2023-11-22 DIAGNOSIS — J690 Pneumonitis due to inhalation of food and vomit: Secondary | ICD-10-CM | POA: Diagnosis present

## 2023-11-22 DIAGNOSIS — Z79899 Other long term (current) drug therapy: Secondary | ICD-10-CM | POA: Diagnosis not present

## 2023-11-22 DIAGNOSIS — Z923 Personal history of irradiation: Secondary | ICD-10-CM | POA: Diagnosis not present

## 2023-11-22 DIAGNOSIS — Z96641 Presence of right artificial hip joint: Secondary | ICD-10-CM | POA: Diagnosis present

## 2023-11-22 DIAGNOSIS — R0602 Shortness of breath: Secondary | ICD-10-CM | POA: Diagnosis present

## 2023-11-22 DIAGNOSIS — J9601 Acute respiratory failure with hypoxia: Secondary | ICD-10-CM | POA: Diagnosis present

## 2023-11-22 DIAGNOSIS — R627 Adult failure to thrive: Secondary | ICD-10-CM | POA: Diagnosis present

## 2023-11-22 DIAGNOSIS — R5383 Other fatigue: Secondary | ICD-10-CM

## 2023-11-22 DIAGNOSIS — Z515 Encounter for palliative care: Secondary | ICD-10-CM

## 2023-11-22 DIAGNOSIS — R64 Cachexia: Secondary | ICD-10-CM | POA: Diagnosis present

## 2023-11-22 DIAGNOSIS — Z9221 Personal history of antineoplastic chemotherapy: Secondary | ICD-10-CM | POA: Diagnosis not present

## 2023-11-22 DIAGNOSIS — K567 Ileus, unspecified: Secondary | ICD-10-CM | POA: Diagnosis present

## 2023-11-22 DIAGNOSIS — E872 Acidosis, unspecified: Secondary | ICD-10-CM | POA: Diagnosis present

## 2023-11-22 DIAGNOSIS — Z1152 Encounter for screening for COVID-19: Secondary | ICD-10-CM | POA: Diagnosis not present

## 2023-11-22 DIAGNOSIS — Z7189 Other specified counseling: Secondary | ICD-10-CM

## 2023-11-22 DIAGNOSIS — E43 Unspecified severe protein-calorie malnutrition: Secondary | ICD-10-CM | POA: Diagnosis present

## 2023-11-22 DIAGNOSIS — G894 Chronic pain syndrome: Secondary | ICD-10-CM | POA: Diagnosis present

## 2023-11-22 DIAGNOSIS — R1310 Dysphagia, unspecified: Secondary | ICD-10-CM | POA: Diagnosis present

## 2023-11-22 DIAGNOSIS — Z7901 Long term (current) use of anticoagulants: Secondary | ICD-10-CM | POA: Diagnosis not present

## 2023-11-22 DIAGNOSIS — R109 Unspecified abdominal pain: Secondary | ICD-10-CM | POA: Diagnosis not present

## 2023-11-22 DIAGNOSIS — Z681 Body mass index (BMI) 19 or less, adult: Secondary | ICD-10-CM | POA: Diagnosis not present

## 2023-11-22 DIAGNOSIS — Z930 Tracheostomy status: Secondary | ICD-10-CM | POA: Diagnosis not present

## 2023-11-22 DIAGNOSIS — E871 Hypo-osmolality and hyponatremia: Secondary | ICD-10-CM | POA: Diagnosis present

## 2023-11-22 DIAGNOSIS — Z23 Encounter for immunization: Secondary | ICD-10-CM | POA: Diagnosis not present

## 2023-11-22 LAB — CBC
HCT: 38.2 % — ABNORMAL LOW (ref 39.0–52.0)
Hemoglobin: 11.7 g/dL — ABNORMAL LOW (ref 13.0–17.0)
MCH: 26.5 pg (ref 26.0–34.0)
MCHC: 30.6 g/dL (ref 30.0–36.0)
MCV: 86.4 fL (ref 80.0–100.0)
Platelets: 223 K/uL (ref 150–400)
RBC: 4.42 MIL/uL (ref 4.22–5.81)
RDW: 16.5 % — ABNORMAL HIGH (ref 11.5–15.5)
WBC: 8.4 K/uL (ref 4.0–10.5)
nRBC: 0 % (ref 0.0–0.2)

## 2023-11-22 LAB — RENAL FUNCTION PANEL
Albumin: 3.4 g/dL — ABNORMAL LOW (ref 3.5–5.0)
Anion gap: 14 (ref 5–15)
BUN: 17 mg/dL (ref 6–20)
CO2: 22 mmol/L (ref 22–32)
Calcium: 9.2 mg/dL (ref 8.9–10.3)
Chloride: 99 mmol/L (ref 98–111)
Creatinine, Ser: 0.94 mg/dL (ref 0.61–1.24)
GFR, Estimated: >60 mL/min (ref >60)
Glucose, Bld: 64 mg/dL — ABNORMAL LOW (ref 70–99)
Phosphorus: 4.3 mg/dL (ref 2.5–4.6)
Potassium: 3.6 mmol/L (ref 3.5–5.1)
Sodium: 135 mmol/L (ref 135–145)

## 2023-11-22 MED ORDER — METHYLNALTREXONE BROMIDE 12 MG/0.6ML ~~LOC~~ SOLN
12.0000 mg | Freq: Once | SUBCUTANEOUS | Status: DC
Start: 2023-11-22 — End: 2023-11-24
  Filled 2023-11-22: qty 0.6

## 2023-11-22 MED ORDER — LIDOCAINE VISCOUS HCL 2 % MT SOLN
15.0000 mL | OROMUCOSAL | Status: DC | PRN
  Administered 2023-11-22 – 2023-11-24 (×6): 15 mL via OROMUCOSAL
  Filled 2023-11-22 (×7): qty 15

## 2023-11-22 NOTE — Progress Notes (Signed)
 SATURATION QUALIFICATIONS: (This note is used to comply with regulatory documentation for home oxygen )  Patient Saturations on Room Air at Rest = 88%  Patient Saturations on Room Air while Ambulating = pt unable to ambulate  Patient Saturations on 6 Liters of oxygen  while at rest: 97%   Please briefly explain why patient needs home oxygen : pt has tracheostomy w/ secretions

## 2023-11-22 NOTE — TOC Progression Note (Addendum)
 Transition of Care [facility]) - Progression Note    Patient Details  Name: David [name] MRN: [id number] Date of Birth: 05-Mar-1975  Transition of [facility]) CM/SW Contact  Marval Gell, RN Phone Number: 11/22/2023, 9:48 AM  Clinical Narrative:     Requested to set up hospital bed and home oxygen .  Patient meeting with provider when I went to the room.  LVM with patient's mother requesting callback.   Patient will need ambulatory sat note stating he is hypoxic at rest with room air and that he recovers on oxygen , please use template.   12:00  Spoke to patient's mother at bedside she states they have room for bed at home and agreeable to any DME company that can have it delivered this weekend. Referral for oxygen  and hospital bed sent through [facility], verified w liaison that bed can be delivered by tomorrow evening   Expected Discharge Plan: OP Rehab Barriers to Discharge: Continued Medical Work up               Expected Discharge Plan and Services   Discharge Planning Services: CM Consult   Living arrangements for the past 2 months: Apartment                                       Social Drivers of Health (SDOH) Interventions SDOH Screenings   Food Insecurity: Food Insecurity Present (11/21/2023)  Housing: Low Risk  (11/21/2023)  Transportation Needs: No Transportation Needs (11/21/2023)  Utilities: Not At Risk (11/21/2023)  Alcohol Screen: Low Risk  (03/19/2022)  Depression (PHQ2-9): Low Risk  (11/13/2022)  Recent Concern: Depression (PHQ2-9) - Medium Risk (09/12/2022)  Financial Resource Strain: Low Risk  (03/19/2022)  Physical Activity: Insufficiently Active (03/19/2022)  Social Connections: Socially Isolated (03/19/2022)  Stress: No Stress Concern Present (03/19/2022)  Tobacco Use: High Risk (11/20/2023)    Readmission Risk Interventions     No data to display

## 2023-11-22 NOTE — Plan of Care (Signed)

## 2023-11-22 NOTE — Consult Note (Cosign Needed)
 Consultation Note Date: 11/22/2023   Patient Name: David [name]  DOB: [birth year]  MRN: [id number]  Age / Sex: 48 y.o., male  PCP: Kandis Perkins, DO Referring Physician: Shawn Sick, MD  Reason for Consultation: Establishing goals of care  HPI/Patient Profile: 48 y.o. 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   Had 2 hospitalizations and 3 ED visits in the last 6 months: 8/18-8/25 admitted for closed right hip fracture, 19/13 ED visit for shortness of breath, 10/13 ED visit for nausea, and 10/22 for shortness of breath.   Worth to note that he is being seen by [facility] outpatient palliative. Last see on 10/06/2023 for GOC discussion, at that time, it was noted for patient not having any new concerning symptom.   He has frequent ED presentations for acute onset dyspnea 2/2 aspiration as he does not follow the recommended dysphagia diet. Most recently was seen in ED on 10/22 for similar symptoms, CTA chest at that time showed patchy airspace disease, subpleural consolidation and RUL scarring (no PE). He was evaluated by Pulm who recommended 10 day course of Augmentin  for aspiration and hypertonic saline nebs for mucus plugging.   PMT has been consulted to assist with goals of care conversation. Patient/Family face treatment option decisions, advanced directive decisions and anticipatory care needs.   Family face treatment option decision, advance directive decisions and anticipatory care needs.   Clinical Assessment and Goals of Care:  I have reviewed medical records including EPIC notes, labs and imaging,  , assessed the patient and then met with patient to discuss diagnosis prognosis, GOC, EOL wishes, disposition and options.  I introduced Palliative Medicine as specialized medical care for people living with serious illness. It focuses on providing relief from the symptoms and stress of a serious illness. The goal is to improve quality of life for both the patient and the family.  Today, I visited the patient at the bedside. No family members were present during the encounter. The patient was alert, oriented, and able to engage in a meaningful conversation. He did not appear to be in acute distress and denied any pain or discomfort. His overall appearance remains chronically ill, with notable severe right-sided torticollis. A tracheostomy is in place, delivering oxygen  at 6 LPM via trach mask. He reports ongoing stiffness and pain of his posterior neck.   The patient demonstrated adequate understanding of his current hospitalization. The patient shared that he has a history of laryngeal cancer, treated with chemotherapy and radiation in 2023, which also led to the creation of his tracheostomy. He was able to clearly articulate the events leading to his current admission, stating that he came to the hospital due to several days of breathing difficulty, which he believes was caused by silent aspiration and panic attack. He is also currently being treated for a chest infection with antibiotics.  We reviewed his acute medical issues, including his high disease burden in the setting of his acute hypoxic respiratory failure, chronic dysphagia, FTT with resulting severe protein calorie malnutrition. Provided education regarding the expected disease trajectory, emphasizing that the patient remains at high risk for clinical decompensation. Explained that, despite ongoing medical interventions, the overall course is  likely to be marked by an uphill battle with potential setbacks due to the inherent limitations and  nature of the illness. He verbalized understanding.   We discussed about his code status. I shared that I worry that he is categorized as full code given his high disease burden. Encouraged patient to consider DNR/DNI status understanding evidenced based poor outcomes in similar hospitalized patient, as the cause of arrest is likely associated with advanced chronic/terminal illness rather than an easily reversible acute cardio-pulmonary event. I explained that DNR/DNI does not change the medical plan and it only comes into effect after a person has arrested (died).  It is a protective measure to keep us  from harming the patient in their last moments of life.  Based on our discussion, patient does not want DNR/DNI. I discussed the potential complications and impact on quality of life following resuscitation. He responded It is okay because I have no quality of life to begin with anyhow.  As it pertains to his chronic dysphagia, FTT, and severe protein calorie malnutrition, he acknowledged the potential consequences, including the possibility of death. We explored the option of a PEG tube, he declined. He understands that given his PEG tube refusal, oral intake remains his only nutritional route, despite the ongoing risk of aspiration. He understands risks of both PEG and oral intake. He associates oral intake with quality of life. I advised aspiration precautions and I encouraged him to follow RD recommendation to optimize nutrition/caloric intake given current malnutrition state.   Following the patient encounter, I contacted his mother, [name], to introduce myself and provide an update. She is aware of her son's declining health and failure to thrive. She described him as "stubborn" and confirmed that he prefers to make his own decisions. She shared that he had previously been homeless and led a difficult lifestyle. In October of last year, she brought him into her home. She noted that he does not wish for  her to be his healthcare decision-maker but affirmed her continued support.  The patient stated that he currently lives alone but plans to stay with his mother temporarily after discharge to regain strength, with the goal of returning to independent living. This plan was confirmed by his mother, and they are working with the social worker to arrange necessary medical equipment at home, including a hospital bed.   Created space and opportunity for family to explore thoughts and feelings regarding patient's current medical condition.   The difference between aggressive medical intervention and comfort care was considered in light of the patient's goals of care. Hospice and Palliative Care services outpatient were explained and offered.   Discussed the importance of continued conversation with family and the medical providers regarding overall plan of care and treatment options, ensuring decisions are within the context of the patient's values and GOCs.   Questions and concerns were addressed.  Hard Choices booklet left for review. The family was encouraged to call with questions or concerns.  PMT will continue to support holistically.   Social History: He lives alone in his apartment. He shared that he enjoys playing video games and previously worked as a immunologist. He also mentioned that alcohol consumption used to bring him happiness.  Functional and Nutritional State: At baseline, the patient is ambulatory but currently uses a walker following hip surgery earlier this year. His appetite remains poor due to chronic dysphagia.  Palliative Symptoms: Shortness of breath, generalized weakness  Advance Directives: A detailed discussion regarding advanced directives was  had. Patient does not have an existing advance directive documents. I discussed the importance of having one in place. I offered Chaplain services to help him with this should he desire to complete one while hospitalized. He  acknowledge this.    Code Status: Full Code  Primary Decision Maker: Patient   SUMMARY OF RECOMMENDATIONS    Code Status: Maintain Full Code per patient's desire.  Continue current scope of care Goal of care is medical stabilization and recovery to the extent this is possible Continue to provide psycho-social and emotional support to patient and family Patient will discharge to his mother's home temporarily to recover, with the goal of returning to independent living in his apartment once stable. Apply warm compress/heating pad PRN to posterior neck due to stiffness and acute pain (patient reports this helps in the past). Palliative medicine team will continue to follow.   Symptom Management: Per Primary team Palliative medicine is available to assist as needed.   Palliative Prophylaxis:  Aspiration precautions   Prognosis:  Prognosis is guarded due to high disease burden, chronic dysphagia, FTT, severe protein malnutrition.   Discharge Planning: To Be Determined      Primary Diagnoses: Present on Admission:  Acute hypoxic respiratory failure (HCC)  Laryngeal cancer (HCC)  Protein-calorie malnutrition, severe  Candidal endocarditis  Continuous tobacco abuse    Physical Exam:  Vitals reviewed.  Constitutional:      Appearance:Cachetic, he is not ill-appearing, toxic-appearing or diaphoretic.   HENT:  Chronic tracheostomy Eyes:     Conjunctiva/sclera: Conjunctivae normal.  Neck:     Comments: Severe right torticollis Trach in place with clear, tan mucus production Cardiovascular: HR 71 BPM Pulmonary:     Effort: Pulmonary effort is normal.  Abdominal:     General: Abdomen is flat. Bowel sounds are normal.     Palpations: There is no mass. .     Comments: Scaphoid abdomen  Skin:    General: Skin is dry.  Neurological:     Mental Status: NAD    Vital Signs: BP 97/78 (BP Location: Left Arm)   Pulse 71   Temp 98.9 F (37.2 C) (Oral)   Resp 17   Ht 5'  10 (1.778 m)   Wt 34.9 kg   SpO2 97%   BMI 11.05 kg/m  Pain Scale: 0-10   Pain Score: 7    SpO2: SpO2: 97 % O2 Device:SpO2: 97 % O2 Flow Rate: .O2 Flow Rate (L/min): 6 L/min   Palliative Assessment/Data: 50%    Total time: I spent 90 minutes in the care of the patient today in the above activities and documenting the encounter.   Detailed review of medical records (labs, imaging, vital signs), medically appropriate exam, discussed with treatment team, counseling and education to patient, family, & staff, documenting clinical information, coordination of care.     Kathlyne JULIANNA Tracie Mickey, NP  Palliative Medicine Team Team phone # [phone]  Thank you for allowing the Palliative Medicine Team to assist in the care of this patient. Please utilize secure chat with additional questions, if there is no response within 30 minutes please call the above phone number.  Palliative Medicine Team providers are available by phone from 7am to 7pm daily and can be reached through the team cell phone.  Should this patient require assistance outside of these hours, please call the patient's attending physician.

## 2023-11-22 NOTE — Progress Notes (Signed)
 HD#0 SUBJECTIVE:   Overnight Events: none  Interim History: He did have a small BM with a formed stool. We discussed the stool burden seen on the xray. He is frustrated that he hasn't been given food and is very hungry. He is worried about not getting his pain medicines, reassured that his will be given all his home pain medicines. Breathing easier than on admission and his stomach pain isn't as bad, he's more concerned with his butt bone pain.   OBJECTIVE:  Vital Signs: Vitals:   11/21/23 2050 11/22/23 0110 11/22/23 0440 11/22/23 0913  BP:    (!) 124/90  Pulse:  68 71 90  Resp:  17 17 16   Temp:    99.8 F (37.7 C)  TempSrc:    Oral  SpO2: 97%   99%  Weight:      Height:       Supplemental O2:  SpO2: 99 % O2 Flow Rate (L/min): 6 L/min FiO2 (%): 28 %  Filed Weights   11/21/23 1313  Weight: 34.9 kg    No intake or output data in the 24 hours ending 11/22/23 9077 Net IO Since Admission: No IO data has been entered for this period [11/22/23 0922]  Physical Exam: Physical Exam Vitals reviewed.  Constitutional:      Appearance: He is not ill-appearing, toxic-appearing or diaphoretic.     Comments: frustrated  HENT:     Nose: Nose normal. No congestion.     Mouth/Throat:     Mouth: Mucous membranes are moist.     Pharynx: Oropharynx is clear. No oropharyngeal exudate.  Eyes:     Extraocular Movements: Extraocular movements intact.     Conjunctiva/sclera: Conjunctivae normal.  Neck:     Comments: Right torticollis Talking clearer today Breathing through trach on room air with sats >95% while not talking, but desats to ~80% when talking Cardiovascular:     Rate and Rhythm: Normal rate and regular rhythm.     Pulses: Normal pulses.     Heart sounds: Normal heart sounds.  Pulmonary:     Effort: Pulmonary effort is normal.     Breath sounds: Normal breath sounds.  Abdominal:     General: Bowel sounds are normal.     Tenderness: There is no abdominal tenderness.  There is no guarding or rebound.     Comments: Scaphoid   Musculoskeletal:        General: No swelling.     Cervical back: Rigidity present.     Right lower leg: No edema.     Left lower leg: No edema.  Skin:    Coloration: Skin is pale.  Neurological:     Mental Status: He is alert. Mental status is at baseline.  Psychiatric:     Comments: anxious     Patient Lines/Drains/Airways Status     Active Line/Drains/Airways     Name Placement date Placement time Site Days   Peripheral IV 11/19/23 22 G Anterior;Right;Upper Arm 11/19/23  0737  Arm  3   Peripheral IV 11/20/23 22 G 1.75 Left;Anterior Forearm 11/20/23  1715  Forearm  2   Tracheostomy 7 mm Other (Comment) --  --  7 mm  --   Wound 11/21/23 0900 Traumatic Thigh Anterior;Proximal;Right 11/21/23  0900  Thigh  1            Pertinent labs and imaging:      Latest Ref Rng & Units 11/22/2023    5:45 AM 11/21/2023  4:07 AM 11/20/2023    5:36 PM  CBC  WBC 4.0 - 10.5 K/uL 8.4  7.2    Hemoglobin 13.0 - 17.0 g/dL 88.2  89.5  89.0   Hematocrit 39.0 - 52.0 % 38.2  33.4  32.0   Platelets 150 - 400 K/uL 223  259         Latest Ref Rng & Units 11/22/2023    5:45 AM 11/21/2023    4:07 AM 11/21/2023   12:17 AM  CMP  Glucose 70 - 99 mg/dL 64  76  85   BUN 6 - 20 mg/dL 17  5  6    Creatinine 0.61 - 1.24 mg/dL 9.05  9.47  9.45   Sodium 135 - 145 mmol/L 135  132  132   Potassium 3.5 - 5.1 mmol/L 3.6  3.6  3.8   Chloride 98 - 111 mmol/L 99  98  96   CO2 22 - 32 mmol/L 22  22  23    Calcium 8.9 - 10.3 mg/dL 9.2  8.8  8.4   Total Protein 6.5 - 8.1 g/dL  7.5  7.2   Total Bilirubin 0.0 - 1.2 mg/dL  0.5  0.7   Alkaline Phos 38 - 126 U/L  51  53   AST 15 - 41 U/L  33  36   ALT 0 - 44 U/L  14  13     DG Abd 1 View Result Date: 11/21/2023 EXAM: 1 VIEW XRAY OF THE ABDOMEN 11/21/2023 10:40:00 AM COMPARISON: None available. CLINICAL HISTORY: abdominal pain; throat cancer FINDINGS: BOWEL: Moderate diffuse gaseous distention of  bowel, possibly reflecting ileus. Moderate colonic stool burden. SOFT TISSUES: No opaque urinary calculi. BONES: Right hip arthroplasty in place. IMPRESSION: 1. Moderate diffuse gaseous distention of bowel, possibly reflecting ileus. Electronically signed by: Ryan Chess MD 11/21/2023 03:26 PM EDT RP Workstation: [id number]    ASSESSMENT/PLAN:  Assessment: Principal Problem:   Acute hypoxic respiratory failure (HCC) Active Problems:   Laryngeal cancer (HCC)   Protein-calorie malnutrition, severe   Tracheostomy status (HCC)   Candidal endocarditis   Continuous tobacco abuse   Advanced care planning/counseling discussion   Abdominal pain   Ileus (HCC)   Plan: David [name] is a 48 y.o. 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    #Acute Hypoxic respiratory failure #Acute Pneumonitis  CTA 10/22 shows signs of atypical infection vs inflammation, negative pulmonary embolism. WBC also wnl today. Breathing comfortably on room air through trach, desats when talking on room air. Has the trach collar at bedside. - Albuterol  nebs followed by hypertonic saline nebs and mucinex  - chest physiotherapy - strep pneumoniae urine antigen negative, legionella pending - respiratory therapy following - scheduled duonebs every 6 hours  - Continue unasyn  EOT 10/31   #abdominal pain  Likely due to severe anorexia and chronic methadone . First bowel movement in several days was yesterday in the ED and also had one overnight after suppository and enema. Encouraged to try relistor and eat food. Not TTP today.  - Relistor ordered   #advanced  care planning #failure to thrive #protein- calorie malnutrition, severe Patient follows outpatient with palliative care. MOST form was deferred at that office visit. Palliative  care saw him 10/25. Patient confirms full code, refuses PEG and understands the risks of both. Tentative plan to d/c to moms house with oxygen .   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  -methadone  130 mg daily -pregabalin  50 mg 3 times daily -sertraline  25 mg daily   #History of fungal endocarditis -home fluconazole  400 mg daily   #History of right IJ vein occlusion Patient has a past medical history of right IJ vein occlusion.  This was diagnosed on admission in 09/04/2022.  Patient encouraged to be on Eliquis  indefinitely.  - continue apixaban  5 mg BID   Best Practice: Diet: Regular diet IVF: Fluids: none, Rate: None VTE: home apixaban   Code: Full  Disposition planning: Therapy Recs: outpatient PT, DME: other hospital bed and oxygen  Family Contact: mom David [name],  DISPO: Anticipated discharge today or tomorrow to Home pending safe discharge plan to home.  Signature:  Viktoria Charmayne [facility]  9:22 AM, 11/22/2023  On Call pager [phone]

## 2023-11-22 NOTE — Plan of Care (Signed)

## 2023-11-22 NOTE — Progress Notes (Signed)
 Mobility Specialist Progress Note:   11/22/23 1227  Mobility  Activity Ambulated with assistance (In hallway)  Level of Assistance Contact guard assist, steadying assist  Assistive Device Other (Comment) (HHA)  Distance Ambulated (ft) 200 ft  Activity Response Tolerated well  Mobility Referral Yes  Mobility visit 1 Mobility  Mobility Specialist Start Time (ACUTE ONLY) 1214  Mobility Specialist Stop Time (ACUTE ONLY) 1224  Mobility Specialist Time Calculation (min) (ACUTE ONLY) 10 min   Received pt in bed and agreeable to mobility. Pt required MinG for safety. C/o pain; RN notified. Returned to room without fault. Left pt in bed. Personal belongings and call light within reach. All needs met.  Lavanda Pollack Mobility Specialist  Please contact via [employer] or  Rehab Office [phone]

## 2023-11-23 ENCOUNTER — Other Ambulatory Visit (HOSPITAL_COMMUNITY): Payer: Self-pay

## 2023-11-23 DIAGNOSIS — Z8521 Personal history of malignant neoplasm of larynx: Secondary | ICD-10-CM | POA: Diagnosis not present

## 2023-11-23 DIAGNOSIS — R109 Unspecified abdominal pain: Secondary | ICD-10-CM | POA: Diagnosis not present

## 2023-11-23 DIAGNOSIS — E43 Unspecified severe protein-calorie malnutrition: Secondary | ICD-10-CM | POA: Diagnosis not present

## 2023-11-23 DIAGNOSIS — R627 Adult failure to thrive: Secondary | ICD-10-CM | POA: Diagnosis not present

## 2023-11-23 LAB — LEGIONELLA PNEUMOPHILA SEROGP 1 UR AG: L. pneumophila Serogp 1 Ur Ag: NEGATIVE

## 2023-11-23 MED ORDER — AMOXICILLIN-POT CLAVULANATE 875-125 MG PO TABS
1.0000 | ORAL_TABLET | Freq: Two times a day (BID) | ORAL | 0 refills | Status: DC
  Filled 2023-11-23: qty 10, 5d supply, fill #0

## 2023-11-23 MED ORDER — IPRATROPIUM-ALBUTEROL 0.5-2.5 (3) MG/3ML IN SOLN: 3.0000 mL | RESPIRATORY_TRACT | Status: DC | PRN

## 2023-11-23 NOTE — TOC Progression Note (Addendum)
 Transition of Care [facility]) - Progression Note    Patient Details  Name: David [name] MRN: [id number] Date of Birth: 17-Sep-1975  [facility]) CM/SW Contact  Marval Gell, RN Phone Number: 11/23/2023, 10:57 AM  Clinical Narrative:     Beatris w patient's mother who states that the hospital bed is delivered, and a 5L concentrator is delivered. Patient noted to be on 6L currently. Clarified w team and DME liaison, a second 5L concentrator has been requested to be delivered to the home to provide up to a 10L flow rate. That will be delivered today per  the liaison.   Patient will DC to [address] [place] [address], to his mother's house. She us  working on getting his medications and trach supplies from his apartment today. We discussed the possibility of transport by [facility] due to high oxygen  demand and weakness, to be determined at time of DC.   Expected Discharge Plan: OP Rehab Barriers to Discharge: Continued Medical Work up               Expected Discharge Plan and Services   Discharge Planning Services: CM Consult   Living arrangements for the past 2 months: Apartment                                       Social Drivers of Health (SDOH) Interventions SDOH Screenings   Food Insecurity: Food Insecurity Present (11/21/2023)  Housing: Low Risk  (11/21/2023)  Transportation Needs: No Transportation Needs (11/21/2023)  Utilities: Not At Risk (11/21/2023)  Alcohol Screen: Low Risk  (03/19/2022)  Depression (PHQ2-9): Low Risk  (11/13/2022)  Recent Concern: Depression (PHQ2-9) - Medium Risk (09/12/2022)  Financial Resource Strain: Low Risk  (03/19/2022)  Physical Activity: Insufficiently Active (03/19/2022)  Social Connections: Socially Isolated (03/19/2022)  Stress: No Stress Concern Present (03/19/2022)  Tobacco Use: High Risk (11/20/2023)    Readmission Risk Interventions     No data to display

## 2023-11-23 NOTE — Progress Notes (Signed)
 Mobility Specialist Progress Note:    11/23/23 1135  Mobility  Activity Ambulated with assistance (In hallway)  Level of Assistance Contact guard assist, steadying assist  Assistive Device Other (Comment) (HHA)  Distance Ambulated (ft) 200 ft  Activity Response Tolerated well  Mobility Referral Yes  Mobility visit 1 Mobility  Mobility Specialist Start Time (ACUTE ONLY) 1050  Mobility Specialist Stop Time (ACUTE ONLY) 1107  Mobility Specialist Time Calculation (min) (ACUTE ONLY) 17 min   Received pt in bed and agreeable to mobility. Pt required MinG for safety. C/o pain, unspecified where. Returned to room without fault. Left pt in bed. Personal belongings and call light within reach. All needs met.  Lavanda Pollack Mobility Specialist  Please contact via [employer] or  Rehab Office [phone]

## 2023-11-23 NOTE — Progress Notes (Signed)
 David [name] SUBJECTIVE:   Overnight Events: none  Interim History: He is doing well this morning. Sitting up, breathing comfortably on the trach collar. He has an appetite for breakfast and enjoyed his dinner last night. His abdominal pain has improved and he can feel his stomach expanding with the meals. He has no complaints and feels okay to go home as long as he has access to his methadone . Discussed plan to d/c to moms house.   OBJECTIVE:  Vital Signs: Vitals:   11/22/23 0913 11/22/23 1735 11/22/23 1953 11/23/23 0836  BP: (!) 124/90 108/86  121/83  Pulse: 90 63 72 (!) 58  Resp: 16 20 18 16   Temp: 99.8 F (37.7 C) 98.2 F (36.8 C)  97.9 F (36.6 C)  TempSrc: Oral Oral  Oral  SpO2: 99% 100% 100% 100%  Weight:      Height:       Supplemental O2: trach collar SpO2: 100 % O2 Flow Rate (L/min): 6 L/min FiO2 (%): 28 %  Filed Weights   11/21/23 1313  Weight: 34.9 kg     Intake/Output Summary (Last 24 hours) at 11/23/2023 1610 Last data filed at 11/22/2023 2000 Gross per 24 hour  Intake 380 ml  Output --  Net 380 ml   Net IO Since Admission: 380 mL [11/23/23 1610]  Physical Exam: Physical Exam Vitals reviewed.  Constitutional:      General: He is not in acute distress.    Appearance: Normal appearance. He is not ill-appearing.  HENT:     Nose: Nose normal.     Mouth/Throat:     Mouth: Mucous membranes are moist.     Pharynx: Oropharynx is clear.  Eyes:     Conjunctiva/sclera: Conjunctivae normal.  Neck:     Comments: R torticollis Trach in place with clean trach collar attached  Cardiovascular:     Rate and Rhythm: Normal rate and regular rhythm.     Pulses: Normal pulses.     Heart sounds: Normal heart sounds.  Pulmonary:     Effort: Pulmonary effort is normal.     Breath sounds: Normal breath sounds.  Abdominal:     General: Abdomen is flat.     Tenderness: There is no abdominal tenderness.  Musculoskeletal:     Cervical back: Rigidity present.      Right lower leg: No edema.     Left lower leg: No edema.  Skin:    General: Skin is warm and dry.  Neurological:     General: No focal deficit present.     Mental Status: He is alert and oriented to person, place, and time.  Psychiatric:        Mood and Affect: Mood normal.        Behavior: Behavior normal.     Comments: Positive mood     Patient Lines/Drains/Airways Status     Active Line/Drains/Airways     Name Placement date Placement time Site Days   Peripheral IV 11/19/23 22 G Anterior;Right;Upper Arm 11/19/23  0737  Arm  4   Peripheral IV 11/20/23 22 G 1.75 Left;Anterior Forearm 11/20/23  1715  Forearm  3   Tracheostomy 7 mm Other (Comment) --  --  7 mm  --   Wound 11/21/23 0900 Traumatic Thigh Anterior;Proximal;Right 11/21/23  0900  Thigh  2            Pertinent labs and imaging:      Latest Ref Rng & Units 11/22/2023    5:45  AM 11/21/2023    4:07 AM 11/20/2023    5:36 PM  CBC  WBC 4.0 - 10.5 K/uL 8.4  7.2    Hemoglobin 13.0 - 17.0 g/dL 88.2  89.5  89.0   Hematocrit 39.0 - 52.0 % 38.2  33.4  32.0   Platelets 150 - 400 K/uL 223  259         Latest Ref Rng & Units 11/22/2023    5:45 AM 11/21/2023    4:07 AM 11/21/2023   12:17 AM  CMP  Glucose 70 - 99 mg/dL 64  76  85   BUN 6 - 20 mg/dL 17  5  6    Creatinine 0.61 - 1.24 mg/dL 9.05  9.47  9.45   Sodium 135 - 145 mmol/L 135  132  132   Potassium 3.5 - 5.1 mmol/L 3.6  3.6  3.8   Chloride 98 - 111 mmol/L 99  98  96   CO2 22 - 32 mmol/L 22  22  23    Calcium 8.9 - 10.3 mg/dL 9.2  8.8  8.4   Total Protein 6.5 - 8.1 g/dL  7.5  7.2   Total Bilirubin 0.0 - 1.2 mg/dL  0.5  0.7   Alkaline Phos 38 - 126 U/L  51  53   AST 15 - 41 U/L  33  36   ALT 0 - 44 U/L  14  13     No results found.  ASSESSMENT/PLAN:  Assessment: Principal Problem:   Acute hypoxic respiratory failure (HCC) Active Problems:   Laryngeal cancer (HCC)   Protein-calorie malnutrition, severe   Tracheostomy status (HCC)   Candidal  endocarditis   Continuous tobacco abuse   Advanced care planning/counseling discussion   Abdominal pain   Ileus (HCC)   Plan: David [name] is a 47 y.o. 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    #Acute Hypoxic respiratory failure #Acute Pneumonitis  WBC also wnl today. Breathing comfortably on through trach collar with no complaints. Lungs CTAB. Will d/c home with oxygen  and nebulizers with recommendations on pulmonary hygiene.  - Albuterol  nebs followed by hypertonic saline nebs and mucinex  - chest physiotherapy - strep pneumoniae urine antigen negative, legionella pending - respiratory therapy following - scheduled duonebs every 6 hours  - Continue unasyn  EOT 10/3, can switch to PO Augmentin  on d/c   #abdominal pain Has improved with food and bowel movements.    #advanced care planning #failure to thrive #protein- calorie malnutrition, severe After discussion from care team, patient chooses to continue regular diet despite risk of aspiration. He understands importance of increasing his nutrition.  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  -methadone  130 mg daily -pregabalin  50 mg 3 times daily -sertraline  25 mg daily   #History of fungal endocarditis -home fluconazole  400 mg daily   #History of right IJ vein occlusion - continue apixaban  5 mg BID  Best Practice: Diet: Regular diet IVF: Fluids: none,  Rate: None VTE: home apixaban  5 mg BID  Code: Full  Disposition planning: Therapy Recs: Home Health, DME: other hospital bed Family Contact: Mom, [name], notified DISPO: Anticipated discharge tomorrow to moms house pending New oxygen .  Signature:  Viktoria Charmayne [facility]  4:10 PM, 11/23/2023  On Call pager [phone]

## 2023-11-24 ENCOUNTER — Other Ambulatory Visit (HOSPITAL_COMMUNITY): Payer: Self-pay

## 2023-11-24 DIAGNOSIS — J690 Pneumonitis due to inhalation of food and vomit: Secondary | ICD-10-CM | POA: Diagnosis not present

## 2023-11-24 DIAGNOSIS — E43 Unspecified severe protein-calorie malnutrition: Secondary | ICD-10-CM | POA: Diagnosis not present

## 2023-11-24 DIAGNOSIS — R1319 Other dysphagia: Secondary | ICD-10-CM | POA: Diagnosis not present

## 2023-11-24 DIAGNOSIS — J9601 Acute respiratory failure with hypoxia: Secondary | ICD-10-CM | POA: Diagnosis not present

## 2023-11-24 MED ORDER — GUAIFENESIN ER 600 MG PO TB12
600.0000 mg | ORAL_TABLET | Freq: Two times a day (BID) | ORAL | 0 refills | Status: AC
  Filled 2023-11-24: qty 60, 30d supply, fill #0

## 2023-11-24 MED ORDER — AMOXICILLIN-POT CLAVULANATE 875-125 MG PO TABS
1.0000 | ORAL_TABLET | Freq: Two times a day (BID) | ORAL | 0 refills | Status: AC
  Filled 2023-11-24: qty 9, 5d supply, fill #0
  Filled 2023-11-24: qty 8, 4d supply, fill #0

## 2023-11-24 NOTE — Plan of Care (Signed)

## 2023-11-24 NOTE — Plan of Care (Signed)
  Problem: Education: Goal: Knowledge of General Education information will improve Description: Including pain rating scale, medication(s)/side effects and non-pharmacologic comfort measures Outcome: Adequate for Discharge   Problem: Health Behavior/Discharge Planning: Goal: Ability to manage health-related needs will improve Outcome: Adequate for Discharge   Problem: Clinical Measurements: Goal: Ability to maintain clinical measurements within normal limits will improve Outcome: Adequate for Discharge Goal: Will remain free from infection Outcome: Adequate for Discharge Goal: Diagnostic test results will improve Outcome: Adequate for Discharge Goal: Respiratory complications will improve Outcome: Adequate for Discharge Goal: Cardiovascular complication will be avoided Outcome: Adequate for Discharge   Problem: Activity: Goal: Risk for activity intolerance will decrease Outcome: Adequate for Discharge   Problem: Nutrition: Goal: Adequate nutrition will be maintained Outcome: Adequate for Discharge   Problem: Coping: Goal: Level of anxiety will decrease Outcome: Adequate for Discharge   Problem: Elimination: Goal: Will not experience complications related to bowel motility Outcome: Adequate for Discharge Goal: Will not experience complications related to urinary retention Outcome: Adequate for Discharge   Problem: Pain Managment: Goal: General experience of comfort will improve and/or be controlled Outcome: Adequate for Discharge   Problem: Safety: Goal: Ability to remain free from injury will improve Outcome: Adequate for Discharge   Problem: Skin Integrity: Goal: Risk for impaired skin integrity will decrease Outcome: Adequate for Discharge   Problem: SLP Dysphagia Goals Goal: Patient will utilize recommended strategies Description: Patient will utilize recommended strategies during swallow to increase swallowing safety with Outcome: Adequate for Discharge    Problem: Malnutrition  (NI-5.2) Goal: Food and/or nutrient delivery Description: Individualized approach for food/nutrient provision. Outcome: Adequate for Discharge   Problem: Acute Rehab OT Goals (only OT should resolve) Goal: Pt. Will Perform Lower Body Bathing Outcome: Adequate for Discharge Goal: Pt. Will Perform Lower Body Dressing Outcome: Adequate for Discharge Goal: Pt. Will Transfer To Toilet Outcome: Adequate for Discharge Goal: Pt. Will Perform Toileting-Clothing Manipulation Outcome: Adequate for Discharge   Problem: Acute Rehab PT Goals(only PT should resolve) Goal: Patient Will Transfer Sit To/From Stand Outcome: Adequate for Discharge Goal: Pt Will Ambulate Outcome: Adequate for Discharge

## 2023-11-24 NOTE — Discharge Summary (Signed)
 Name: David [name] MRN: [id number] DOB: December 19, 1975 48 y.o. PCP: Kandis Perkins, DO  Date of Admission: 11/20/2023  3:16 PM Date of Discharge: 11/24/2023 Attending Physician: Dr. Jone Dauphin  Discharge Diagnosis: 1. Principal Problem:   Acute hypoxic respiratory failure (HCC) Active Problems:   Laryngeal cancer (HCC)   Protein-calorie malnutrition, severe   Tracheostomy status (HCC)   Candidal endocarditis   Continuous tobacco abuse   Advanced care planning/counseling discussion   Abdominal pain   Ileus [facility])    Discharge Medications: Allergies as of 11/24/2023   No Known Allergies      Medication List     STOP taking these medications    chlorhexidine  4 % external liquid Commonly known as: HIBICLENS    methocarbamol  500 MG tablet Commonly known as: ROBAXIN    Rollator Ultra-Light Misc       TAKE these medications    acetaminophen  325 MG tablet Commonly known as: TYLENOL  Take 2 tablets (650 mg total) by mouth 3 (three) times daily.   amoxicillin -clavulanate 875-125 MG tablet Commonly known as: AUGMENTIN  Take 1 tablet by mouth 2 (two) times daily for 4 days.   apixaban  5 MG Tabs tablet Commonly known as: Eliquis  Take 1 tablet (5 mg total) by mouth 2 (two) times daily.   CertaVite/Antioxidants Tabs Take 1 tablet by mouth daily.   diazePAM  5 MG/5ML Soln Take 3 mLs (3 mg total) by mouth every 8 (eight) hours as needed. TAKE 3 MLS (3 MG TOTAL) BY MOUTH EVERY 8 (EIGHT) HOURS AS NEEDED FOR MUSCLE SPASMS (DYSPHAGIA).   ferrous sulfate  300 (60 Fe) MG/5ML syrup TAKE 5 MLS (300 MG TOTAL) BY MOUTH DAILY.   fluconazole  200 MG tablet Commonly known as: DIFLUCAN  Take 2 tablets (400 mg total) by mouth daily.   guaiFENesin  600 MG 12 hr tablet Commonly known as: MUCINEX  Take 1 tablet (600 mg total) by mouth 2 (two) times daily.   ipratropium-albuterol  0.5-2.5 (3) MG/3ML Soln Commonly known as: DUONEB Take 3 mLs by nebulization in the morning, at  noon, in the evening, and at bedtime.   lidocaine  2 % solution Commonly known as: XYLOCAINE  Use as directed 15 mLs in the mouth or throat every 6 (six) hours as needed for mouth pain. Mix 1part 2% viscous lidocaine , 1part water . Swish and spit 30mL of total diluted mixture up to eight times a day, prn as needed for soreness   methadone  10 MG/ML solution Commonly known as: DOLOPHINE  Take 130 mg by mouth daily.   ondansetron  4 MG disintegrating tablet Commonly known as: ZOFRAN -ODT Take 1 tablet (4 mg total) by mouth every 8 (eight) hours as needed for nausea or vomiting.   Pregabalin  20 MG/ML Soln Take 50 mg by mouth 3 (three) times daily.   sertraline  20 MG/ML concentrated solution Commonly known as: ZOLOFT  MIX 1.25 MLS (25 MG TOTAL) WITH 1/2 CUP OF WATER  AND TAKE BY MOUTH DAILY.   sodium chloride  HYPERTONIC 3 % nebulizer solution Take by nebulization 2 (two) times daily.   Vitamin D  (Ergocalciferol ) 1.25 MG (50000 UNIT) Caps capsule Commonly known as: DRISDOL  Take 1 capsule (50,000 Units total) by mouth every 7 (seven) days.               Durable Medical Equipment  (From admission, onward)           Start     Ordered   11/23/23 1109  For home use only DME oxygen   Once       Question Answer Comment  Length  of Need Lifetime   Liters per Minute 5   Oxygen  delivery system: Gas      11/23/23 1108   11/22/23 1156  For home use only DME Hospital bed  Once       Comments: Therapuetic mattress  Question Answer Comment  Length of Need Lifetime   Patient has (list medical condition): contractures, and aspiration   The above medical condition requires: Patient requires the ability to reposition frequently   Head must be elevated greater than: 30 degrees   Bed type Semi-electric      11/22/23 1156            Disposition and follow-up:   Mr.[name] was discharged from [facility] in Stable condition.  At the hospital follow up visit  please address:  1.  Acute pneumonitis on Chronic aspiration: Confirm he completed 10 day antibiotic course (EOT 10/31). Assess concern for infection and need for refills of nebulizers. Was sent home with oxygen .  Constipation: Assess symptoms and need to provide laxatives. Consider relistor.  Severe Malnutrition and Chronic Dysphagia: Assess weight. Goals of care conversations held during hospitalization with decision to remain Full code and not pursue PEG tube. Patient assumes risk of aspiration and chooses to continue regular diet.   2.  Labs / imaging needed at time of follow-up: none  3.  Pending labs/ test needing follow-up: none  Follow-up Appointments:  Follow-up Information     Nooruddin, Saad, MD. Call today.   Specialty: Internal Medicine Why: 10:45am Contact information: [address] [place] [address] [phone]         [facility] Follow up.   Specialty: Rehabilitation Why: call to schedule apt for physical thearpy Contact information: [address] [place]  [phone]                 Hospital Course by problem list:  David [name] is a 48 y.o. 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    #Acute Hypoxic respiratory failure Patient is trach dependent and likely chronically aspirates. The day before admission, patient went to ER and was seen by pulmonology and discharged with augmentin  for 10 days. He came back the following day because his symptoms didn't improve and was admitted. RVP COVID, Flu, RSV,  and extended panel were neg. Procalcitonin <0.10. Urine Ag strep pneumoniae and legionella negative. CTA on admission, shows signs of atypical infection vs inflammation. No signs of pulmonary embolism. I suspect chronic aspiration and poor pulmonary toilet led to an acute pneumonitis with symptoms of SOB. Was on IV Unasyn  while hospitalized. RT saw him in the hospital and provided chest physiotherapy and scheduled nebulizer treatments (albuterol  followed by hypertonic saline). Patient breathes comfortably on room air, desaturates when talking on room air, but maintains saturations while talking when using the tracheostomy collar (~6L). Will discharge with home oxygen , albuterol  and hypertonic saline nebulizers, as well as PO Augmentin  to complete 10 day course EOT 10/31.    #abdominal pain  Presented with diffuse abdominal pain in the setting of anorexia, without bowel movement in 4 days, and has history of chronic pain  on chronic methadone  130 mg daily. Lipase, AST/ALT/alk phos wnl, CK wnl, and UA wnl. KUB 10/24 c/w bowel distention, possible ileus. Patient had a bowel movement on admission with some improvement in his pain. Given enemas and suppositories in hospital with improvement. Was tolerating PO intake and had bowel sounds in all 4 quadrants on discharge.   #failure to thrive #protein- calorie malnutrition, severe #chronic dysphagia Patient is trach dependent and has chronic risk of aspiration. Follows with palliative outpatient. Weight at last office visit on 9/26 was 81 lbs and on admission is at 77lbs.  Severely malnourished with scaphoid abdomen, muscle wasting, and pallor on exam. Palliative care saw him while in hospital. Patient understands his circumstances, he does not want a PEG tube and chooses to continue regular diet despite risk of aspiration.    #Lactic acidosis, resolved 2.4 to 1.6. BMI 12. Likely due to anorexia and dehydration.    #Stage IV squamous cell carcinoma of the larynx  status post tracheostomy Patient has a past medical history of laryngeal cancer and sees Lauraine Golden, MD. He has completed radiation therapy to the larynx on 02/09/2021, He follows with ENT at [facility] and follows with trach clinic.     #Chronic pain syndrome Patient follows palliative care for chronic pain.  He is currently on methadone  at home.  His current dose includes 130 mg of methadone  daily as well as pregabalin  50 mg 3 times daily.  #History of fungal endocarditis Patient has a history of fungal endocarditis. He follows with Dr. Overton of ID.  Per his last note on 01/07/2023, patient to be on 400 mg of fluconazole  daily.  He is to continue this indefinitely.     #History of right IJ vein occlusion Patient has a past medical history of right IJ vein occlusion.  This was diagnosed on admission in 09/04/2022.  Patient encouraged to be on Eliquis  5 mg BID indefinitely.   Subjective He expresses he is sad today and worries about his health. We discussed the plan to get him home with family where he can be more comfortable. Discussed plan for discharge, will make sure he gets methadone  before leaving hospital.   Discharge Exam:   BP 127/79 (BP Location: Right Arm)   Pulse 61   Temp 98.4 F (36.9 C) (Oral)   Resp 16   Ht 5' 10 (1.778 m)   Wt 38.5 kg   SpO2 98%   BMI 12.18 kg/m  Discharge exam:  Physical Exam Vitals reviewed.  Constitutional:      Appearance: He is not ill-appearing.     Comments: Cachectic   HENT:     Nose: No congestion.     Mouth/Throat:     Mouth: Mucous membranes are moist.     Pharynx: Oropharynx is clear.  Eyes:     Extraocular Movements: Extraocular movements intact.     Conjunctiva/sclera: Conjunctivae normal.  Neck:     Comments: Right torticollis Tracheostomy in place and patent Cardiovascular:     Rate and Rhythm: Normal rate and regular rhythm.     Pulses: Normal pulses.     Heart sounds: Normal heart sounds.  Pulmonary:     Effort:  Pulmonary effort is normal.     Breath sounds: Normal breath sounds.  Abdominal:     General: Abdomen is flat. Bowel sounds are normal.     Tenderness: There is no guarding.  Musculoskeletal:        General: No swelling.  Comments: Bulk severely diminished   Skin:    General: Skin is warm and dry.     Coloration: Skin is pale.  Neurological:     Mental Status: He is alert and oriented to person, place, and time. Mental status is at baseline.  Psychiatric:        Thought Content: Thought content normal.     Comments: Diminished affect    Pertinent Labs, Studies, and Procedures:     Latest Ref Rng & Units 11/22/2023    5:45 AM 11/21/2023    4:07 AM 11/20/2023    5:36 PM  CBC  WBC 4.0 - 10.5 K/uL 8.4  7.2    Hemoglobin 13.0 - 17.0 g/dL 88.2  89.5  89.0   Hematocrit 39.0 - 52.0 % 38.2  33.4  32.0   Platelets 150 - 400 K/uL 223  259         Latest Ref Rng & Units 11/22/2023    5:45 AM 11/21/2023    4:07 AM 11/21/2023   12:17 AM  CMP  Glucose 70 - 99 mg/dL 64  76  85   BUN 6 - 20 mg/dL 17  5  6    Creatinine 0.61 - 1.24 mg/dL 9.05  9.47  9.45   Sodium 135 - 145 mmol/L 135  132  132   Potassium 3.5 - 5.1 mmol/L 3.6  3.6  3.8   Chloride 98 - 111 mmol/L 99  98  96   CO2 22 - 32 mmol/L 22  22  23    Calcium 8.9 - 10.3 mg/dL 9.2  8.8  8.4   Total Protein 6.5 - 8.1 g/dL  7.5  7.2   Total Bilirubin 0.0 - 1.2 mg/dL  0.5  0.7   Alkaline Phos 38 - 126 U/L  51  53   AST 15 - 41 U/L  33  36   ALT 0 - 44 U/L  14  13     DG Abd 1 View Result Date: 11/21/2023 EXAM: 1 VIEW XRAY OF THE ABDOMEN 11/21/2023 10:40:00 AM COMPARISON: None available. CLINICAL HISTORY: abdominal pain; throat cancer FINDINGS: BOWEL: Moderate diffuse gaseous distention of bowel, possibly reflecting ileus. Moderate colonic stool burden. SOFT TISSUES: No opaque urinary calculi. BONES: Right hip arthroplasty in place. IMPRESSION: 1. Moderate diffuse gaseous distention of bowel, possibly reflecting ileus.  Electronically signed by: Ryan Chess MD 11/21/2023 03:26 PM EDT RP Workstation: [id number]   DG Chest Port 1 View Result Date: 11/20/2023 CLINICAL DATA:  Dyspnea 3 days intermittently. EXAM: PORTABLE CHEST 1 VIEW COMPARISON:  11/19/2023 and 11/10/2023 as well as chest CT 11/19/2023 FINDINGS: Tracheostomy tube in adequate position. Patient is rotated to the right. Lungs are adequately inflated. There is stable left apical pleural thickening. Mild interstitial prominence over the right upper lobe/apex unchanged. No focal lobar consolidation or effusion. Recently seen airspace process on CT not well visualized radiographically. Cardiomediastinal silhouette and remainder of the exam is unchanged. IMPRESSION: 1. No acute cardiopulmonary disease. 2. Stable left apical pleural thickening. Stable mild interstitial prominence over the right upper lobe/apex. Electronically Signed   By: Toribio Agreste M.D.   On: 11/20/2023 16:26     Discharge Instructions: Discharge Instructions     Ambulatory referral to Physical Therapy   Complete by: As directed    Ambulatory referral to Physical Therapy   Complete by: As directed    Call MD for:  redness, tenderness, or signs of infection (pain, swelling, redness, odor or green/yellow discharge around incision  site)   Complete by: As directed    Call MD for:  temperature >100.4   Complete by: As directed    Diet general   Complete by: As directed    Increase activity slowly   Complete by: As directed    No wound care   Complete by: As directed        Signed: Charmayne Holmes, DO 11/24/2023, 1:25 PM

## 2023-11-24 NOTE — Progress Notes (Signed)
  Progress Note   Date: 11/24/2023  Patient Name: David [name]        MRN#: [id number]  Clarification of the diagnosis of respiratory failure:       Acute hypoxic respiratory failure is clinically supported, and the clinical signs of respiratory distress were dyspnea, the treatment provided was oxygen  supplementation and pulmonary hygiene measures (suctioning, chest PT, hypertonic saline nebulizers, expectorants) and the lab/pulse oximeter results that support it were SpO2 <88% RA

## 2023-11-24 NOTE — TOC Transition Note (Addendum)
 Transition of Care [facility]) - Discharge Note   Patient Details  Name: David [name] MRN: [id number] Date of Birth: Jul 07, 1975  [facility]) CM/SW Contact:  Roxie KANDICE Stain, RN Phone Number: 11/24/2023, 11:26 AM   Clinical Narrative:    Patient is stable for discharge. DME have been delivered. Patient's Mom will transport home.  Patient requested OP  rehab at [facility]. Referral sent.   PTAR notified for transportation.   Final next level of care: OP Rehab Barriers to Discharge: Barriers Resolved   Patient Goals and CMS Choice Patient states their goals for this hospitalization and ongoing recovery are:: home with mom          Discharge Placement               Home        Discharge Plan and Services Additional resources added to the After Visit Summary for     Discharge Planning Services: CM Consult                                 Social Drivers of Health (SDOH) Interventions SDOH Screenings   Food Insecurity: Food Insecurity Present (11/21/2023)  Housing: Low Risk  (11/21/2023)  Transportation Needs: No Transportation Needs (11/21/2023)  Utilities: Not At Risk (11/21/2023)  Alcohol Screen: Low Risk  (03/19/2022)  Depression (PHQ2-9): Low Risk  (11/13/2022)  Recent Concern: Depression (PHQ2-9) - Medium Risk (09/12/2022)  Financial Resource Strain: Low Risk  (03/19/2022)  Physical Activity: Insufficiently Active (03/19/2022)  Social Connections: Socially Isolated (03/19/2022)  Stress: No Stress Concern Present (03/19/2022)  Tobacco Use: High Risk (11/20/2023)     Readmission Risk Interventions     No data to display

## 2023-11-24 NOTE — Progress Notes (Signed)
 Physical Therapy Treatment Patient Details Name: David [name] MRN: [id number] DOB: 05/09/1975 Today's Date: 11/24/2023   History of Present Illness Patient is a 48 yo male presenting to the ED with poor appetite, SOB, and pain on 11/20/23. Admitted with respiratory failure. 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 Pt expressed concerned regarding going home today. Pt amb 175' with L HHA. Pt with no LOB, c/o SOB but was able to hold conversation t/o ambulation distance. Pt mobilizing well to return home with mothers assist. Acute PT to cont to follow to improve activity tolerance and indep mobililty.     If plan is discharge home, recommend the following: A little help with walking and/or transfers;A little help with bathing/dressing/bathroom;Assistance with cooking/housework;Assist for transportation;Help with stairs or ramp for entrance   Can travel by private vehicle        Equipment Recommendations  Hospital bed (? home O2)    Recommendations for Other Services       Precautions / Restrictions Precautions Precautions: Fall;Other (comment) Recall of Precautions/Restrictions: Intact Precaution/Restrictions Comments: watch O2 Restrictions Weight Bearing Restrictions Per Provider Order: No     Mobility  Bed Mobility Overal bed mobility: Modified Independent             General bed mobility comments: pt able to manage lines and linens, no use of bed rails, HOB flat    Transfers Overall transfer level: Needs assistance Equipment used: 1 person hand held assist Transfers: Sit to/from Stand Sit to Stand: Contact guard assist           General transfer comment: CGA with HHA for stability, pt  preference    Ambulation/Gait Ambulation/Gait assistance: Min assist Gait Distance (Feet): 175 Feet Assistive device: 1 person hand held assist Gait Pattern/deviations: Step-through pattern, Decreased stride length, Staggering left, Drifts right/left Gait velocity: dec Gait velocity interpretation: <1.31 ft/sec, indicative of household ambulator   General Gait Details: Pt with general instability without device.  Pt with definite need of UE support for balance and cannot be challenged without device or loses balance. Pt aware he needs to use RW at home for safety. c/o SOB, however declined supplemental O2   Stairs             Wheelchair Mobility     Tilt Bed    Modified Rankin (Stroke Patients Only)       Balance Overall balance assessment: Mild deficits observed, not formally tested                                          Communication Communication Communication: Impaired Factors Affecting Communication: Trach/intubated (PMV)  Cognition Arousal: Alert Behavior During Therapy: Anxious (regarding going home today)   PT - Cognitive impairments: No family/caregiver present to determine baseline                       PT - Cognition Comments: pt with noted anxiety re: possibility of d/c home with mother today, pt tearful stating I don't know, I just feel bad. I felt good the last 3 days. Following commands: Intact      Cueing Cueing Techniques: Verbal  cues  Exercises      General Comments General comments (skin integrity, edema, etc.): O2 at 75% however poor pleth reading      Pertinent Vitals/Pain Pain Assessment Pain Assessment: No/denies pain    Home Living                          Prior Function            PT Goals (current goals can now be found in the care plan section) Acute Rehab PT Goals Patient Stated Goal: to go home with mother PT Goal Formulation: With patient Time For Goal Achievement:  12/05/23 Potential to Achieve Goals: Good Progress towards PT goals: Progressing toward goals    Frequency    Min 2X/week      PT Plan      Co-evaluation              AM-PAC PT 6 Clicks Mobility   Outcome Measure  Help needed turning from your back to your side while in a flat bed without using bedrails?: None Help needed moving from lying on your back to sitting on the side of a flat bed without using bedrails?: None Help needed moving to and from a bed to a chair (including a wheelchair)?: A Little Help needed standing up from a chair using your arms (e.g., wheelchair or bedside chair)?: A Little Help needed to walk in hospital room?: Total Help needed climbing 3-5 steps with a railing? : Total 6 Click Score: 16    End of Session Equipment Utilized During Treatment: Gait belt Activity Tolerance: Patient limited by fatigue Patient left: in bed;with call bell/phone within reach Nurse Communication: Mobility status PT Visit Diagnosis: Muscle weakness (generalized) (M62.81)     Time: 9075-9060 PT Time Calculation (min) (ACUTE ONLY): 15 min  Charges:    $Gait Training: 8-22 mins PT General Charges $$ ACUTE PT VISIT: 1 Visit                     Norene Ames, PT, DPT Acute Rehabilitation Services Secure chat preferred Office #: [phone]    Norene CHRISTELLA Ames 11/24/2023, 9:53 AM

## 2023-11-24 NOTE — Progress Notes (Signed)
 Speech Language Pathology Treatment: Dysphagia  Patient Details Name: David [name] MRN: [id number] DOB: [birth year] Today's Date: 11/24/2023 Time: [id number] SLP Time Calculation (min) (ACUTE ONLY): 21 min  Assessment / Plan / Recommendation Clinical Impression  Pt seen for dysphagia session and is planning to discharge home with his mom today. 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 Interestingly, he told SLP that he changed his mind about getting the PEG and was told he was not a candidate, however, SLP cannot locate documentation re: this and during Palliative care meeting 10/25 stated we explored the option of a PEG tube, he declined. He understands that given his PEG tube refusal, oral intake remains his only nutritional route, despite the ongoing risk of aspiration. Residents note 10/26 documented After discussion from care team, patient chooses to continue regular diet despite risk of aspiration.  Several times during the session he became labile and stated I don't want to die.   Pt wearing PMV on arrival and consumed sips water  and several pieces graham cracker with frequent mildly delayed coughing after po's and throughout session which is expected with his history of aspiration. SLP encouraged him to continue to cough frequently during meals, swallow x 2 to assist in reduction of suspected pharyngeal retention, sit upright. Educated pt to prioritize eating protein, caloric dense foods, Ensure etc to maximize nutrition. Pt then removed his trach and asked therapist to hand him the trach brush and he cleaned the inner cannula removing mucous (medium brown colored and close to tint of graham cracker). He replaced trach and PMV.   SLP given copy of swallow exercises (familiar with them from outpatient ST) and performed to increase pharyngeal contraction, tongue base retraction and hyolaryngeal  elevation and able to demonstrate with difficulty with Mendelsohn maneuver. Encouraged to practice at home to continue to increase laryngeal mobility/strength and use of swallow musculature. May benefit from continued ST at outpatient. Pt is at high risk for aspiration and readmission for respiratory compromise.     HPI HPI: David [name] is a 48 yo male with hx of stage IVB laryngeal cancer admitted with shortness of breath and abdominal pain. Endorses active smoking. CTA shows scattered areas of scarring, architectural distortion, and volume loss within the R lung compatible with sequelae of chronic inflammation or infection. He is well-known to SLP service at [facility] and [facility]. He is s/p chemo and XRT. 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 Neck contraction has worsened over the last year to the point where he tried OP PT but with little benefit.  He is followed by David [name], PCCM, at trach clinic, who has had to make significant manipulations/adaptations to his tracheostomy to avoid skin breakdown from the flanges.  He is prone to mucous plugging. Uses a PMV. Not a candidate for decannulation. Hx trach 06/09/21, PEG 06/20/21, subsequently fell out per his report. 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 Quality of imaging was compromised by shoulder/neck positioning and it was difficult to visualize anatomical markers. There were no incidents of nasal regurgitation on today's study (reported to be happening more frequently). There was minimal mobility of the larynx and incomplete laryngeal closure, leading to intermittent aspiration of thin and nectar  thick liquids, occurring before and after the swallow response. There was frequent tongue pumping  and effort to propel material from mouth through pharynx, leaving residuals in posterior oral cavity with multiple f/u swallows needed to move bolus into throat. There was minimal pharyngeal residue post-swallow. OP SLP to tx dysphagia from 9/23-12/23 and again from 01/14/23-03/04/23. He was managing to eat regular solids/thin liquids. Pt has adamantly declined replacement of PEG in the past, though acknowledges that he continues to lose weight.      SLP Plan  Continue with current plan of care          Recommendations  Diet recommendations: Regular;Thin liquid Liquids provided via: Cup;Straw Medication Administration: Crushed with puree Supervision: Patient able to self feed Compensations: Slow rate;Small sips/bites Postural Changes and/or Swallow Maneuvers: Out of bed for meals;Seated upright 90 degrees                  Oral care BID   Set up Supervision/Assistance Dysphagia, unspecified (R13.10)     Continue with current plan of care     Dustin Olam Bull  11/24/2023, 12:45 PM

## 2023-11-25 ENCOUNTER — Other Ambulatory Visit: Payer: Self-pay

## 2023-11-25 ENCOUNTER — Ambulatory Visit (HOSPITAL_COMMUNITY): Payer: MEDICAID

## 2023-11-25 ENCOUNTER — Telehealth: Payer: Self-pay

## 2023-11-25 NOTE — Transitions of Care (Post Inpatient/ED Visit) (Signed)
 11/25/2023  Name: David [name] MRN: [id number] DOB: 02/27/1975  Today's TOC FU Call Status: Today's TOC FU Call Status:: Successful TOC FU Call Completed TOC FU Call Complete Date: 11/25/23 Patient's Name and Date of Birth confirmed.  Transition Care Management Follow-up Telephone Call Date of Discharge: 11/24/23 Discharge Facility: [facility] Type of Discharge: Inpatient Admission Primary Inpatient Discharge Diagnosis:: hypoxic respiratory failure How have you been since you were released from the hospital?: Better Any questions or concerns?: No  Items Reviewed: Did you receive and understand the discharge instructions provided?: Yes Medications obtained,verified, and reconciled?: Yes (Medications Reviewed) Any new allergies since your discharge?: No Dietary orders reviewed?: Yes Do you have support at home?: Yes People in Home [RPT]: parent(s)  Medications Reviewed Today: Medications Reviewed Today     Reviewed by Emmitt Pan, LPN (Licensed Practical Nurse) on 11/25/23 at 1004  Med List Status: <None>   Medication Order Taking? Sig Documenting Provider Last Dose Status Informant  acetaminophen  (TYLENOL ) 325 MG tablet [id number] Yes Take 2 tablets (650 mg total) by mouth 3 (three) times daily. Charmayne Holmes, DO  Active Self, Pharmacy Records  amoxicillin -clavulanate (AUGMENTIN ) 875-125 MG tablet [id number] Yes Take 1 tablet by mouth 2 (two) times daily for 4 days. Charmayne Holmes, DO  Active   apixaban  (ELIQUIS ) 5 MG TABS tablet [id number] Yes Take 1 tablet (5 mg total) by mouth 2 (two) times daily. Charmayne Holmes, DO  Active Self, Pharmacy Records  diazePAM  5 MG/5ML SOLN [id number] Yes Take 3 mLs (3 mg total) by mouth every 8 (eight) hours as needed. TAKE 3 MLS (3 MG TOTAL) BY MOUTH EVERY 8 (EIGHT) HOURS AS NEEDED FOR MUSCLE SPASMS (DYSPHAGIA). Pickenpack-Cousar, Fannie SAILOR, NP  Active Self, Pharmacy Records  ferrous sulfate  300 (60 Fe) MG/5ML syrup [id number]  TAKE  5 MLS (300 MG TOTAL) BY MOUTH DAILY.  Patient not taking: Reported on 11/25/2023   Kandis Perkins, DO  Active Self, Pharmacy Records  fluconazole  (DIFLUCAN ) 200 MG tablet [id number] Yes Take 2 tablets (400 mg total) by mouth daily. D'Mello, Rosalyn, DO  Active Self, Pharmacy Records  guaiFENesin  (MUCINEX ) 600 MG 12 hr tablet [id number] Yes Take 1 tablet (600 mg total) by mouth 2 (two) times daily. Kandis Perkins, DO  Active   ipratropium-albuterol  (DUONEB) 0.5-2.5 (3) MG/3ML SOLN [id number]  Take 3 mLs by nebulization in the morning, at noon, in the evening, and at bedtime.  Patient not taking: Reported on 11/25/2023   Jenna Maude BRAVO, NP  Active Self, Pharmacy Records           Med Note [facility], DONETA GORMAN Schaumann Nov 20, 2023 11:05 PM) Hasn't picked up yet  lidocaine  (XYLOCAINE ) 2 % solution [id number] Yes Use as directed 15 mLs in the mouth or throat every 6 (six) hours as needed for mouth pain. Mix 1part 2% viscous lidocaine , 1part water . Swish and spit 30mL of total diluted mixture up to eight times a day, prn as needed for soreness Pickenpack-Cousar, Fannie SAILOR, NP  Active Self, Pharmacy Records  methadone  (DOLOPHINE ) 10 MG/ML solution [id number] Yes Take 130 mg by mouth daily. [provider]  Active Self, Pharmacy Records, Other           Med Note (WHITE, DONETA GORMAN Schaumann Nov 20, 2023 11:30 PM) Dosing verified by Rosina Agent, Regional Director  Multiple Vitamin (MULTIVITAMIN WITH MINERALS) TABS tablet [id number] Yes Take 1 tablet by mouth daily. Charmayne Holmes, DO  Active Self, Pharmacy Records  ondansetron  (ZOFRAN -ODT)  4 MG disintegrating tablet [id number] Yes Take 1 tablet (4 mg total) by mouth every 8 (eight) hours as needed for nausea or vomiting. Zelaya, Oscar A, PA-C  Active Self, Pharmacy Records  Pregabalin  20 MG/ML SOLN [id number] Yes Take 50 mg by mouth 3 (three) times daily. Zelaya, Oscar A, PA-C  Active Self, Pharmacy Records  sertraline  (ZOLOFT ) 20 MG/ML concentrated solution [id number]  Yes MIX 1.25 MLS (25 MG TOTAL) WITH 1/2 CUP OF WATER  AND TAKE BY MOUTH DAILY. Kandis Perkins, DO  Active Self, Pharmacy Records  sodium chloride  HYPERTONIC 3 % nebulizer solution [id number] Yes Take by nebulization 2 (two) times daily. Jenna Maude BRAVO, NP  Active Self, Pharmacy Records  Vitamin D , Ergocalciferol , (DRISDOL ) 1.25 MG (50000 UNIT) CAPS capsule [id number]  Take 1 capsule (50,000 Units total) by mouth every 7 (seven) days.  Patient not taking: Reported on 11/25/2023   Lovie Clarity, MD  Active Self, Pharmacy Records  Med List Note Isabel, Doneta RAMAN, CPhT 11/20/23 2312): [facility] [place] [phone]            Home Care and Equipment/Supplies: Were Home Health Services Ordered?: No (patient to call to schedule) Any new equipment or medical supplies ordered?: Yes Name of Medical supply agency?: unknown Were you able to get the equipment/medical supplies?: Yes Do you have any questions related to the use of the equipment/supplies?: No  Functional Questionnaire: Do you need assistance with bathing/showering or dressing?: Yes Do you need assistance with meal preparation?: Yes Do you need assistance with eating?: No Do you have difficulty maintaining continence: No Do you need assistance with getting out of bed/getting out of a chair/moving?: No Do you have difficulty managing or taking your medications?: Yes  Follow up appointments reviewed: PCP Follow-up appointment confirmed?: Yes Date of PCP follow-up appointment?: 12/01/23 [facility] Follow-up appointment confirmed?: NA Do you need transportation to your follow-up appointment?: No Do you understand care options if your condition(s) worsen?: Yes-patient verbalized understanding    SIGNATURE Julian Lemmings, LPN [facility] Nurse Health Advisor Direct Dial [phone]

## 2023-11-27 ENCOUNTER — Inpatient Hospital Stay: Payer: MEDICAID | Attending: Hematology and Oncology | Admitting: Hematology and Oncology

## 2023-11-27 ENCOUNTER — Telehealth: Payer: Self-pay | Admitting: Student

## 2023-11-27 ENCOUNTER — Inpatient Hospital Stay: Payer: MEDICAID | Admitting: Nurse Practitioner

## 2023-11-27 VITALS — BP 110/81 | HR 79 | Temp 98.4°F | Resp 16 | Wt 79.3 lb

## 2023-11-27 DIAGNOSIS — Z8521 Personal history of malignant neoplasm of larynx: Secondary | ICD-10-CM | POA: Diagnosis present

## 2023-11-27 DIAGNOSIS — Z9221 Personal history of antineoplastic chemotherapy: Secondary | ICD-10-CM | POA: Insufficient documentation

## 2023-11-27 DIAGNOSIS — C329 Malignant neoplasm of larynx, unspecified: Secondary | ICD-10-CM | POA: Diagnosis not present

## 2023-11-27 DIAGNOSIS — Z923 Personal history of irradiation: Secondary | ICD-10-CM | POA: Diagnosis not present

## 2023-11-27 DIAGNOSIS — Z930 Tracheostomy status: Secondary | ICD-10-CM | POA: Diagnosis not present

## 2023-11-27 NOTE — Telephone Encounter (Signed)
 Copied from CRM [id number]. Topic: Clinical - Prescription Issue >> Nov 27, 2023 10:52 AM Marda G wrote: Reason for CRM:  due to radiation, patient throat is narrow and can not swallow large pills. Patients mom is asking for an alternative mediation for ( guaiFENesin  (MUCINEX ) 600 MG 12 hr tablet)   Please advise

## 2023-11-28 NOTE — Progress Notes (Signed)
 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   He was seen last in ENT office in Sep 2025 .  He is once again here for follow-up.  Since his last visit here he continues to deal with neck contracture, ongoing fungal endocarditis.  History of laryngeal cancer Severe contracture likely secondary to radiation therapy.  Unfortunately this is debilitating, he is unable to eat and is a high risk for aspiration Recently hospitalized and was offered G tube, pt refused. At this time given his very poor PS, we discussed that he will not be a candidate for any treatment from us  even if he recurs. We discussed about considering hospice, he said he hates it. It is very challenging for him to come to this appointments, since he wont be a candidate for any treatments from us  in the future, I offered to see him as needed He can contact palliative care if he intends to proceed with hospice. Last thyroid  labs in June, reviewed, no concerns.  SUMMARY OF ONCOLOGIC HISTORY: Oncology History  Laryngeal cancer (HCC)  12/01/2020 Cancer Staging   Staging form: Larynx - Supraglottis, AJCC 8th Edition - Clinical stage from 12/01/2020: Stage IVB (cT3, cN3b, cM0) - Signed by Izell Domino, MD on 12/20/2020 Stage prefix: Initial diagnosis   12/04/2020 Initial Diagnosis   Malignant neoplasm of overlapping sites of larynx [facility])    12/25/2020 - 01/24/2021 Chemotherapy   Patient is on Treatment Plan : HEAD/NECK Cisplatin  + XRT q21d      CURRENT THERAPY: s/p chemo and radiation  INTERVAL HISTORY:  David [name] 48 y.o. male returns for evaluation of his head neck cancer.    The patient, with a history of chemoradiation therapy, endocarditis, and a blood clot, presents with a severely contracted neck. His contracture continues to get worse. He is not able to eat and has lost more weight. He refused a G tube. He was recently admitted and discharged for acute pneumonitis and chronic aspiration. 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 04/03/2023   Difficulty urinating 03/27/2023   Subclinical hypothyroidism 02/10/2023   FTT (failure to thrive) in adult 12/31/2022   Dysphagia 12/31/2022   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 12/31/2022   Hepatitis A immune 11/14/2022   End of life care 10/23/2022   Acute purulent bronchitis 10/23/2022   Hepatitis C 10/09/2022   Left leg pain 10/09/2022  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   History of pancytopenia 09/12/2022   Torticollis 07/04/2022   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   Defective dental restoration 12/31/2021   Chronic periodontitis 12/31/2021   Impacted third molar tooth 12/31/2021   Irritant contact dermatitis  associated with digestive stoma 12/25/2021   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   Continuous tobacco abuse 11/21/2021   Dermatitis associated with moisture 10/16/2021   Back pain 08/16/2021   Candidal endocarditis    History of radiation to head and neck region 07/11/2021   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   Dysgeusia 07/11/2021   Pressure injury of skin 06/17/2021   Tracheostomy status (HCC)    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  MEDICAL HISTORY: Past Medical History:  Diagnosis Date   Acute respiratory failure with hypoxia (HCC)    AKI (acute kidney injury) 01/08/2021   Aspiration pneumonia of right lower lobe (HCC) 06/06/2021   Bacterial endocarditis    Chronic HFrEF (heart failure with reduced ejection fraction) (HCC) 06/09/2021   Community acquired pneumonia due to Pneumococcus 04/01/2021   Evaluation by medical service required 03/19/2022   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HCC)    Leaking PEG tube (HCC) 12/12/2021   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)    Pneumococcal bacteremia 06/09/2021   Pneumothorax    Left lung spontaneous pneumothorax at age 48 yr    S/P percutaneous endoscopic gastrostomy (PEG) tube placement (HCC) 06/24/2021    SURGICAL HISTORY: Past Surgical History:  Procedure Laterality Date   chest tubes Left    IR GASTROSTOMY TUBE MOD SED  06/18/2021    LAPAROSCOPIC INSERTION GASTROSTOMY TUBE N/A 06/20/2021   Procedure: LAPAROSCOPIC INSERTION GASTROSTOMY TUBE;  Surgeon: Signe Mitzie LABOR, MD;  Location: [facility] OR;  Service: General;  Laterality: N/A;  DOW PATIENT   TOTAL HIP ARTHROPLASTY Right 09/17/2023   Procedure: ARTHROPLASTY, HIP, TOTAL, ANTERIOR APPROACH;  Surgeon: Kendal Franky SQUIBB, MD;  Location: [facility] OR;  Service: Orthopedics;  Laterality: Right;   TRACHEOSTOMY TUBE PLACEMENT N/A 06/09/2021   Procedure: AWAKE TRACHEOSTOMY;  Surgeon: Carlie Clark, MD;  Location: [facility] OR;  Service: ENT;  Laterality: N/A;    SOCIAL HISTORY: Social History   Socioeconomic History   Marital status: Divorced    Spouse name: Not on file   Number of children: Not on file   Years of education: Not on file   Highest education level: Not on file  Occupational History   Not on file  Tobacco Use   Smoking status: Every Day    Types: Cigarettes   Smokeless tobacco: Never   Tobacco comments:    1 cigs per day  Vaping Use   Vaping status: Never Used  Substance and Sexual Activity   Alcohol use: Not Currently    Alcohol/week: 12.0 standard drinks of alcohol    Types: 12 Cans of beer per week   Drug use:  Not Currently    Types: IV    Comment: heroin (re-est w/ Methadone  clinic as of 11/30/20)   Sexual activity: Not on file  Other Topics Concern   Not on file  Social History Narrative   Not on file   Social Drivers of Health   Financial Resource Strain: Low Risk  (03/19/2022)   Overall Financial Resource Strain (CARDIA)    Difficulty of Paying Living Expenses: Not hard at all  Food Insecurity: Food Insecurity Present (11/21/2023)   Hunger Vital Sign    Worried About Running Out of Food in the Last Year: Sometimes true    Ran Out of Food in the Last Year: Sometimes true  Transportation Needs: No Transportation Needs (11/21/2023)   PRAPARE - Administrator, Civil Service (Medical): No    Lack of Transportation (Non-Medical): No  Physical  Activity: Insufficiently Active (03/19/2022)   Exercise Vital Sign    Days of Exercise per Week: 3 days    Minutes of Exercise per Session: 20 min  Stress: No Stress Concern Present (03/19/2022)   [employer] of Occupational Health - Occupational Stress Questionnaire    Feeling of Stress : Only a little  Social Connections: Socially Isolated (03/19/2022)   Social Connection and Isolation Panel    Frequency of Communication with Friends and Family: More than three times a week    Frequency of Social Gatherings with Friends and Family: Three times a week    Attends Religious Services: Never    Active Member of Clubs or Organizations: No    Attends Banker Meetings: Never    Marital Status: Divorced  Catering Manager Violence: Not At Risk (11/21/2023)   Humiliation, Afraid, Rape, and Kick questionnaire    Fear of Current or Ex-Partner: No    Emotionally Abused: No    Physically Abused: No    Sexually Abused: No    FAMILY HISTORY: No family history on file.   PHYSICAL EXAMINATION  ECOG PERFORMANCE STATUS: 1 - Symptomatic but completely ambulatory  Vitals:   11/27/23 1358  BP: 110/81  Pulse: 79  Resp: 16  Temp: 98.4 F (36.9 C)  SpO2: 96%   Physical Exam Constitutional:      Comments: He is frail, appears very cachectic  Neck:     Comments: Severe contracture of the right neck.  Cardiovascular:     Rate and Rhythm: Normal rate and regular rhythm.     Pulses: Normal pulses.     Heart sounds: Normal heart sounds.  Pulmonary:     Effort: Pulmonary effort is normal.     Breath sounds: Normal breath sounds.     Comments: Scattered bronchial breathing noted. Musculoskeletal:        General: No swelling.     Cervical back: Rigidity present.     Comments: Range of motion limited in the neck.  Neurological:     Mental Status: He is alert.      LABORATORY DATA:  CBC    Component Value Date/Time   WBC 8.4 11/22/2023 0545   RBC 4.42 11/22/2023 0545    HGB 11.7 (L) 11/22/2023 0545   HGB 9.1 (L) 10/10/2023 1140   HCT 38.2 (L) 11/22/2023 0545   HCT 29.7 (L) 10/10/2023 1140   PLT 223 11/22/2023 0545   PLT 203 10/10/2023 1140   MCV 86.4 11/22/2023 0545   MCV 87 10/10/2023 1140   MCH 26.5 11/22/2023 0545   MCHC 30.6 11/22/2023 0545   RDW 16.5 (  H) 11/22/2023 0545   RDW 17.0 (H) 10/10/2023 1140   LYMPHSABS 0.5 (L) 11/19/2023 0351   MONOABS 0.4 11/19/2023 0351   EOSABS 0.1 11/19/2023 0351   BASOSABS 0.0 11/19/2023 0351    CMP     Component Value Date/Time   NA 135 11/22/2023 0545   NA 138 11/13/2022 1540   K 3.6 11/22/2023 0545   CL 99 11/22/2023 0545   CO2 22 11/22/2023 0545   GLUCOSE 64 (L) 11/22/2023 0545   BUN 17 11/22/2023 0545   BUN 11 11/13/2022 1540   CREATININE 0.94 11/22/2023 0545   CREATININE 0.66 11/08/2022 0859   CREATININE 0.78 10/24/2022 1451   CALCIUM 9.2 11/22/2023 0545   PROT 7.5 11/21/2023 0407   PROT 7.1 11/13/2022 1540   ALBUMIN  3.4 (L) 11/22/2023 0545   ALBUMIN  3.7 (L) 11/13/2022 1540   AST 33 11/21/2023 0407   AST 46 (H) 11/08/2022 0859   ALT 14 11/21/2023 0407   ALT 19 11/08/2022 0859   ALKPHOS 51 11/21/2023 0407   BILITOT 0.5 11/21/2023 0407   BILITOT <0.2 11/13/2022 1540   BILITOT 0.6 11/08/2022 0859   GFRNONAA >60 11/22/2023 0545   GFRNONAA >60 11/08/2022 0859   GFRAA  01/25/2008 2040    >60        The eGFR has been calculated using the MDRD equation. This calculation has not been validated in all clinical   CBC from October 16 with mild anemia, no evidence of leukopenia or thrombocytopenia, TSH mildly abnormal but free T4 was normal CMP today with normal creatinine  RADIOGRAPHIC STUDIES:  No results found.  PET CT 9/28  CONCLUSION:   1.  Status post chemoradiation of a right hypopharyngeal/supraglottic squamous cell carcinoma and subsequent tracheostomy with foci of increased FDG uptake visualized along the posterior aspect of the left cricoid cartilage and along the anterior  aspect of the right cricoarytenoid cartilage, which appear to correlate to foci of contrast enhancement seen on prior CT neck dated 08/11/2021 and are concerning for recurrent disease. Correlation with direct inspection and tissue sampling is recommended.  2.  Mild diffuse uptake along the tracheostomy site and central neck compartment is favored to represent posttreatment changes.  3.  Focus of mild FDG avidity likely within the upper esophagus could be further evaluated by endoscopy versus attention on follow-up imaging as clinically indicated. No definite associated soft tissue mass on CT is noted in this region.  4.  Non-FDG avid, peripherally calcified cervical lymph nodes are stable in comparison to prior CT neck. No new, enlarging or FDG avid lymph nodes are identified.  5.  Cardiomegaly, with significantly increased size of the right ventricle in comparison to prior. Recommend cardiologic consultation and echocardiogram if not already performed.    6.  Diffuse groundglass opacities and tree-in-bud opacifications with multiple subcentimeter nodules involving the right lung are new in comparison to prior PET CT, however improved relative to most recent CT chest and likely represent evolution of prior pneumonia.  PATHOLOGY: 11/08/2021  Final Pathologic Diagnosis    A. RIGHT PYRIFORM, BIOPSY:               Squamous mucosa with fibrosis.              Negative for high-grade dysplasia and carcinoma     All questions were answered. The patient knows to call the clinic with any problems, questions or concerns. We can certainly see the patient much sooner if necessary.  Total encounter time: 30 minutes  in face-to-face visit time, chart review, lab review, care coordination, and documentation of the encounter*  *Total Encounter Time as defined by the Centers for Medicare and Medicaid Services includes, in addition to the face-to-face time of a patient visit (documented in the note above)  non-face-to-face time: obtaining and reviewing outside history, ordering and reviewing medications, tests or procedures, care coordination (communications with other health care professionals or caregivers) and documentation in the medical record.

## 2023-12-01 ENCOUNTER — Telehealth: Payer: Self-pay

## 2023-12-01 ENCOUNTER — Ambulatory Visit (INDEPENDENT_AMBULATORY_CARE_PROVIDER_SITE_OTHER): Payer: MEDICAID | Admitting: Student

## 2023-12-01 VITALS — BP 112/76 | HR 68 | Temp 98.3°F | Ht 70.0 in | Wt 80.4 lb

## 2023-12-01 DIAGNOSIS — G893 Neoplasm related pain (acute) (chronic): Secondary | ICD-10-CM | POA: Diagnosis not present

## 2023-12-01 DIAGNOSIS — Z515 Encounter for palliative care: Secondary | ICD-10-CM

## 2023-12-01 DIAGNOSIS — C329 Malignant neoplasm of larynx, unspecified: Secondary | ICD-10-CM | POA: Diagnosis not present

## 2023-12-01 DIAGNOSIS — R634 Abnormal weight loss: Secondary | ICD-10-CM

## 2023-12-01 DIAGNOSIS — C321 Malignant neoplasm of supraglottis: Secondary | ICD-10-CM

## 2023-12-01 DIAGNOSIS — Z930 Tracheostomy status: Secondary | ICD-10-CM

## 2023-12-01 MED ORDER — SODIUM CHLORIDE 3 % IN NEBU: INHALATION_SOLUTION | Freq: Two times a day (BID) | RESPIRATORY_TRACT | 12 refills | Status: AC

## 2023-12-01 NOTE — Assessment & Plan Note (Signed)
 Weight somewhat stable around 80 pounds. Still eating PO with small foods and sandwiches. Again discussed with him the risks of this, which could include death, however he does not want to stop. Per documentation he has declined PEG tube placement for nutrition, however pt states that he is ineligible for it.

## 2023-12-01 NOTE — Assessment & Plan Note (Deleted)
 Cancer not primary concern currently. PEG tube not replaced due to ineligibility. - Continue supportive care per palliative recommendations. - Follow up with palliative care team and oncology - Transfer prescriptions to [facility] in [place].

## 2023-12-01 NOTE — Telephone Encounter (Signed)
 Pt mother, Audie, called asking for a refill of diazepam  and pregabalin , on behalf of the pt. RN explained that pt would need to be seen prior to refills due his No-Show of palliative f/u last week. [name] voiced frustration, RN explained the safety concerns and need for monitoring in controled substances. Next available f/u appt made, [name] verbalized understanding, no further needs at this time.

## 2023-12-01 NOTE — Patient Instructions (Addendum)
 Thank you so much for coming to the clinic today!   I have refilled the hypertonic saline nebulizer  I have reached out to the palliative team at the cancer center to schedule an appointment as well as to refill the Diazepam  and Pregabalin  as they had been prescribing it.  If you have any questions please feel free to the call the clinic at anytime at [phone]. It was a pleasure seeing you!  Best, Dr. Kaitlan Bin

## 2023-12-01 NOTE — Assessment & Plan Note (Signed)
 Requires home oxygen , nebulizer with albuterol  and hypertonic saline. Needs suction equipment. - Refill hypertonic saline. - Ensure suction equipment availability.

## 2023-12-01 NOTE — Progress Notes (Signed)
 "  CC: Hospital follow up  HPI:  [name] is a 48 y.o. male living with a history stated below and presents today for hospital follow up. Please see problem based assessment and plan for additional details.   Discussed the use of AI scribe software for clinical note transcription with the patient, who gave verbal consent to proceed.  History of Present Illness David [name] is a 48 year old male with a history of cancer who presents with respiratory issues and medication management. He is accompanied by a caregiver who manages his medications and care at home.  He has significant respiratory issues requiring the use of an oxygen  machine at home. Initially, a ten-liter machine was used, but due to malfunction, he switched to a five-liter machine. Oxygen  is administered through his tracheostomy for about four hours daily. He uses a nebulizer with albuterol  and hypertonic saline to manage symptoms. He completed a course of antibiotics for pneumonia, which ended on Halloween, but continues to cough up a significant amount of mucus, affecting his clothing and surroundings.  He experiences muscle spasms in his throat, managed with liquid diazepam , initiated by palliative care at the cancer center. There have been challenges in obtaining refills for diazepam  and pregabalin , which he uses for anxiety and muscle spasms. His caregiver has relocated him to her home in [place] and is [facility]  He has anxiety, particularly at night, and has expressed fear of adverse events. He has a hospital bed set up in the living room for easier access to the bathroom and television. He has stopped smoking, which is a significant lifestyle change.  Regarding his diet, he consumes softer foods like chicken salad sandwiches and angel hair Alfredo, chopped small to prevent choking. He was previously considered for a PEG tube but was not a  candidate. A past PEG tube fell out, causing significant drainage and discomfort.  His blood pressure was noted to be low during a recent visit. Discussions about advanced care planning have occurred, but he has not signed any documents. He prefers to make his own decisions and has not designated a power of attorney.     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 180 tablet 0   apixaban  (ELIQUIS ) 5 MG TABS tablet Take 1 tablet (5 mg total) by mouth 2 (two) times daily. 180 tablet 3   diazePAM  5 MG/5ML SOLN Take 3 mLs (3 mg total) by mouth every 8 (eight) hours as needed. TAKE 3 MLS (3 MG TOTAL) BY MOUTH EVERY 8 (EIGHT) HOURS AS NEEDED FOR MUSCLE SPASMS (DYSPHAGIA). 120 mL 0  ferrous sulfate  300 (60 Fe) MG/5ML syrup TAKE 5 MLS (300 MG TOTAL) BY MOUTH DAILY. (Patient not taking: Reported on 11/25/2023) 450 mL 0   fluconazole  (DIFLUCAN ) 200 MG tablet TAKE 2 TABLETS BY MOUTH EVERY DAY 60 tablet 0   guaiFENesin  (MUCINEX ) 600 MG 12 hr tablet Take 1 tablet (600 mg total) by mouth 2 (two) times daily. 60 tablet 0   ipratropium-albuterol  (DUONEB)  0.5-2.5 (3) MG/3ML SOLN Take 3 mLs by nebulization in the morning, at noon, in the evening, and at bedtime. (Patient not taking: Reported on 11/25/2023) 360 mL 12   lidocaine  (XYLOCAINE ) 2 % solution Use as directed 15 mLs in the mouth or throat every 6 (six) hours as needed for mouth pain. Mix 1part 2% viscous lidocaine , 1part water . Swish and spit 30mL of total diluted mixture up to eight times a day, prn as needed for soreness 200 mL 2   methadone  (DOLOPHINE ) 10 MG/ML solution Take 130 mg by mouth daily.     Multiple Vitamin (MULTIVITAMIN WITH MINERALS) TABS tablet Take 1 tablet by mouth daily. 30 tablet 0   ondansetron  (ZOFRAN -ODT) 4 MG disintegrating tablet Take 1 tablet (4 mg total) by mouth every 8 (eight) hours as needed for nausea or vomiting. 20 tablet 0   Pregabalin  20 MG/ML SOLN Take 50 mg by mouth 3 (three) times daily. 160 mL 0   sertraline  (ZOLOFT ) 20 MG/ML concentrated solution MIX 1.25 MLS (25 MG TOTAL) WITH 1/2 CUP OF WATER  AND TAKE BY MOUTH DAILY. 60 mL 2   Vitamin D , Ergocalciferol , (DRISDOL ) 1.25 MG (50000 UNIT) CAPS capsule Take 1 capsule (50,000 Units total) by mouth every 7 (seven) days. (Patient not taking: Reported on 11/25/2023) 5 capsule 0   No current facility-administered medications on file prior to visit.    No family history on file.  Social History   Socioeconomic History   Marital status: Divorced    Spouse name: Not on file   Number of children: Not on file   Years of education: Not on file   Highest education level: Not on file  Occupational History   Not on file  Tobacco Use   Smoking status: Every Day    Types: Cigarettes   Smokeless tobacco: Never   Tobacco comments:    1 cigs per day  Vaping Use   Vaping status: Never Used  Substance and Sexual Activity   Alcohol use: Not Currently    Alcohol/week: 12.0 standard drinks of alcohol    Types: 12 Cans of beer per week   Drug use: Not Currently    Types: IV    Comment: heroin (re-est w/  Methadone  clinic as of 11/30/20)   Sexual activity: Not on file  Other Topics Concern   Not on file  Social History Narrative   Not on file   Social Drivers of Health   Financial Resource Strain: Low Risk  (03/19/2022)   Overall Financial Resource Strain (CARDIA)    Difficulty of Paying Living Expenses: Not hard at all  Food Insecurity: Food Insecurity Present (11/21/2023)   Hunger Vital Sign    Worried About Running Out of Food in the Last Year: Sometimes true    Ran Out of Food in the Last Year: Sometimes true  Transportation Needs: No Transportation Needs (11/21/2023)   PRAPARE - Administrator, Civil Service (Medical): No    Lack of Transportation (Non-Medical): No  Physical Activity: Insufficiently Active (03/19/2022)   Exercise Vital Sign  Days of Exercise per Week: 3 days    Minutes of Exercise per Session: 20 min  Stress: No Stress Concern Present (03/19/2022)   [employer] of Occupational Health - Occupational Stress Questionnaire    Feeling of Stress : Only a little  Social Connections: Socially Isolated (03/19/2022)   Social Connection and Isolation Panel    Frequency of Communication with Friends and Family: More than three times a week    Frequency of Social Gatherings with Friends and Family: Three times a week    Attends Religious Services: Never    Active Member of Clubs or Organizations: No    Attends Banker Meetings: Never    Marital Status: Divorced  Catering Manager Violence: Not At Risk (11/21/2023)   Humiliation, Afraid, Rape, and Kick questionnaire    Fear of Current or Ex-Partner: No    Emotionally Abused: No    Physically Abused: No    Sexually Abused: No    Review of Systems: ROS negative except for what is noted on the assessment and plan.  Vitals:   12/01/23 1037  BP: 112/76  Pulse: 68  Temp: 98.3 F (36.8 C)  TempSrc: Oral  SpO2: 100%  Weight: 80 lb 6.4 oz (36.5 kg)  Height: 5' 10 (1.778 m)     Physical Exam: Constitutional: Chronically ill appearing male,  in no acute distress HENT: normocephalic atraumatic, mucous membranes moist, torticollis present Eyes: conjunctiva non-erythematous Neck: supple Cardiovascular: regular rate and rhythm, no m/r/g Pulmonary/Chest: normal work of breathing on room air, lungs clear to auscultation bilaterally Abdominal: soft, non-tender, non-distended  Assessment & Plan:   Laryngeal cancer (HCC) Cancer not primary concern currently. Not interested in hospice care at the moment. Did have discussions about advanced care planning and he is adamant about being full code. He does not have a power of attorney, as he wants to make decisions on his own behalf. I did discuss with him if he were in a position where he couldn't make his own decisions he should appoint someone to represent his wishes, he is interested in establishing an advanced directive.  - Continue supportive care per palliative recommendations. - Follow up with palliative care team and oncology - [facility] [place]  Tracheostomy in place [facility]) Requires home oxygen , nebulizer with albuterol  and hypertonic saline. Needs suction equipment. - Refill hypertonic saline. - Ensure suction equipment availability.  Weight loss, unintentional Weight somewhat stable around 80 pounds. Still eating PO with small foods and sandwiches. Again discussed with him the risks of this, which could include death, however he does not want to stop. Per documentation he has declined PEG tube placement for nutrition, however pt states that he is ineligible for it.      Patient discussed with Dr. Chambliss  Aurore Redinger, M.D. [facility] Internal Medicine, PGY-3 Pager: [phone] Date 12/01/2023 Time 1:26 PM  "

## 2023-12-01 NOTE — Assessment & Plan Note (Addendum)
 Cancer not primary concern currently. Not interested in hospice care at the moment. Did have discussions about advanced care planning and he is adamant about being full code. He does not have a power of attorney, as he wants to make decisions on his own behalf. I did discuss with him if he were in a position where he couldn't make his own decisions he should appoint someone to represent his wishes, he is interested in establishing an advanced directive.  - Continue supportive care per palliative recommendations. - Follow up with palliative care team and oncology - [facility] [place].

## 2023-12-02 NOTE — Progress Notes (Signed)
 Internal [facility] Clinic Attending  Case discussed with the resident at the time of the visit.  We reviewed the resident's history and exam and pertinent patient test results.  I agree with the assessment, diagnosis, and plan of care documented in the resident's note.

## 2023-12-03 ENCOUNTER — Other Ambulatory Visit: Payer: Self-pay

## 2023-12-03 ENCOUNTER — Inpatient Hospital Stay: Payer: MEDICAID | Attending: Hematology and Oncology | Admitting: Nurse Practitioner

## 2023-12-03 ENCOUNTER — Encounter: Payer: Self-pay | Admitting: Nurse Practitioner

## 2023-12-03 VITALS — BP 123/81 | HR 88 | Temp 97.5°F | Resp 16 | Ht 70.0 in | Wt 81.0 lb

## 2023-12-03 DIAGNOSIS — F419 Anxiety disorder, unspecified: Secondary | ICD-10-CM

## 2023-12-03 DIAGNOSIS — R634 Abnormal weight loss: Secondary | ICD-10-CM

## 2023-12-03 DIAGNOSIS — C329 Malignant neoplasm of larynx, unspecified: Secondary | ICD-10-CM

## 2023-12-03 DIAGNOSIS — Z515 Encounter for palliative care: Secondary | ICD-10-CM | POA: Diagnosis not present

## 2023-12-03 DIAGNOSIS — R630 Anorexia: Secondary | ICD-10-CM

## 2023-12-03 DIAGNOSIS — R530 Neoplastic (malignant) related fatigue: Secondary | ICD-10-CM

## 2023-12-03 DIAGNOSIS — G893 Neoplasm related pain (acute) (chronic): Secondary | ICD-10-CM | POA: Diagnosis not present

## 2023-12-03 DIAGNOSIS — M62838 Other muscle spasm: Secondary | ICD-10-CM

## 2023-12-03 DIAGNOSIS — Z7189 Other specified counseling: Secondary | ICD-10-CM

## 2023-12-03 MED ORDER — DIAZEPAM 5 MG/5ML PO SOLN: 5.0000 mg | Freq: Four times a day (QID) | ORAL | 0 refills | Status: AC | PRN

## 2023-12-03 MED ORDER — PREGABALIN 20 MG/ML PO SOLN: 60.0000 mg | Freq: Three times a day (TID) | ORAL | 2 refills | Status: DC

## 2023-12-03 NOTE — Progress Notes (Signed)
 Orders for [facility] [place] [facility] faxed to referral line at [phone].

## 2023-12-03 NOTE — Progress Notes (Signed)
 Palliative Medicine [facility]  [phone] [phone]   Name: David [name] Date: 12/03/2023 MRN: [id number]  DOB: 1976/01/11  Patient Care Team: Kandis Perkins, DO as PCP - General (Internal Medicine) [name], Delon CROME, RN as Oncology Nurse Navigator [name], Gerard LABOR, DO as Consulting Physician (Otolaryngology) Izell Domino, MD as Consulting Physician (Radiation Oncology) Loretha Ash, MD as Consulting Physician (Hematology and Oncology) Pickenpack-Cousar, Fannie SAILOR, NP as Nurse Practitioner (Nurse Practitioner) Silvano Valrie SQUIBB, RN as Registered Nurse (Oncology)    INTERVAL HISTORY: David [name] is a 48 y.o. male with multiple medical problems including stage IV squamous cell carcinoma, right supraglottic mass, adenopathy, s/p initial concurrent chemoradiation, history of drug use currently followed by methadone  clinic, homelessness, and some malnutrition. He was recently hospitalized for several months at [facility] where he received treatment for fungal endocarditis and required emergent tracheostomy and PEG tube. During hospitalization CT of chest showed concerns for residual vs recurrent tumor. Palliative requested to re-engage for ongoing support and goals of care.   SOCIAL HISTORY:     reports that he has been smoking cigarettes. He has never used smokeless tobacco. He reports that he does not currently use alcohol after a past usage of about 12.0 standard drinks of alcohol per week. He reports that he does not currently use drugs after having used the following drugs: IV.  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  MEDICATIONS:  Current Outpatient Medications  Medication Sig Dispense Refill   acetaminophen  (TYLENOL ) 325 MG tablet Take 2 tablets (650 mg total) by mouth 3 (three) times daily. 180 tablet 0   apixaban  (ELIQUIS ) 5 MG TABS tablet Take 1 tablet (5 mg total) by mouth 2 (two) times daily. 180 tablet 3   diazePAM  5 MG/5ML SOLN Take 5 mLs (5 mg total) by mouth every 6 (six) hours as needed. TAKE 3 MLS (3 MG TOTAL) BY MOUTH EVERY 8 (EIGHT) HOURS AS NEEDED FOR MUSCLE SPASMS (DYSPHAGIA). 250 mL 0   ferrous sulfate  300 (60 Fe) MG/5ML syrup TAKE 5 MLS (300 MG TOTAL) BY MOUTH DAILY. (Patient not taking: Reported on 11/25/2023) 450 mL 0   fluconazole  (DIFLUCAN ) 200 MG tablet TAKE 2 TABLETS BY MOUTH EVERY DAY 60 tablet 0   guaiFENesin  (MUCINEX ) 600 MG 12 hr tablet Take 1 tablet (600 mg total) by mouth 2 (two) times daily. 60 tablet 0   ipratropium-albuterol  (DUONEB) 0.5-2.5 (3) MG/3ML SOLN Take 3 mLs by nebulization in the morning, at noon, in the evening, and at bedtime. (Patient not taking: Reported on 11/25/2023) 360 mL 12   lidocaine  (XYLOCAINE ) 2 % solution Use as directed 15 mLs in the mouth  or throat every 6 (six) hours as needed for mouth pain. Mix 1part 2% viscous lidocaine , 1part water . Swish and spit 30mL of total diluted mixture up to eight times a day, prn as needed for soreness 200 mL 2   methadone  (DOLOPHINE ) 10 MG/ML solution Take 130 mg by mouth daily.     Multiple Vitamin (MULTIVITAMIN WITH  MINERALS) TABS tablet Take 1 tablet by mouth daily. 30 tablet 0   ondansetron  (ZOFRAN -ODT) 4 MG disintegrating tablet Take 1 tablet (4 mg total) by mouth every 8 (eight) hours as needed for nausea or vomiting. 20 tablet 0   Pregabalin  20 MG/ML SOLN Take 60 mg by mouth 3 (three) times daily. 270 mL 2   sertraline  (ZOLOFT ) 20 MG/ML concentrated solution MIX 1.25 MLS (25 MG TOTAL) WITH 1/2 CUP OF WATER  AND TAKE BY MOUTH DAILY. 60 mL 2   sodium chloride  HYPERTONIC 3 % nebulizer solution Take by nebulization 2 (two) times daily. 750 mL 12   Vitamin D , Ergocalciferol , (DRISDOL ) 1.25 MG (50000 UNIT) CAPS capsule Take 1 capsule (50,000 Units total) by mouth every 7 (seven) days. (Patient not taking: Reported on 11/25/2023) 5 capsule 0   No current facility-administered medications for this visit.    VITAL SIGNS: BP 123/81 (BP Location: Left Arm, Patient Position: Sitting)   Pulse 88   Temp (!) 97.5 F (36.4 C) (Tympanic)   Resp 16   Ht 5' 10 (1.778 m)   Wt 81 lb (36.7 kg)   SpO2 98%   BMI 11.62 kg/m  Filed Weights   12/03/23 1016  Weight: 81 lb (36.7 kg)     Estimated body mass index is 11.62 kg/m as calculated from the following:   Height as of this encounter: 5' 10 (1.778 m).   Weight as of this encounter: 81 lb (36.7 kg).   PERFORMANCE STATUS (ECOG) : 1 - Symptomatic but completely ambulatory  Physical Exam General: cachectic, fatigue Cardiovascular: regular rate and rhythm Pulmonary: normal breathing pattern, trach in place, Passy-Muir valve, congested cough Extremities: Neck contracture Skin: no rashes, muscle wasting  Neurological: AAO x3  IMPRESSION: Discussed the use of AI scribe software for clinical note transcription with the patient, who gave verbal consent to proceed.  History of Present Illness David [name] is a 48 year old male with a history of tracheostomy and stage IV squamous cell carcinoma who presents to clinic for symptom management  follow-up.   He is accompanied by his mother.  Patient reports occasional nausea. He has declined significantly since previous visit. Has lost quite a bit of weight and is now requiring a hospital bed with occasional oxygen  use in the home.   David [name] was doing well and able to stay at his own apartment. He now is back at home with his mother due to health challenges. Recent hospitalization due to acute respiratory failure secondary to aspiration.   Patient previously with PEG and clear that he has no future desire to pursue replacement despite understanding of severe malnutrition, dysphagia, and high risk of recurrent aspiration. He states he is trying to eat the best he can.   He suffered a fall on 8/18 resulting in displaced right femoral neck and heal fracture s/p right total hip on 8/20. Per patient and his mother he has declined PT despite recommendations. He has just not bounced back from this.   Appetite is fair. Some days are better than others. Weight is is down to 81lbs with severe muscle wasting. He is having  to sit on a cushion to support his sacral area. David [name] head is much more contracted than past months.   David [name] is taking diazepam  for anxiety and pregabalin  and methadone  for pain. Regimen is in effective over the past several weeks and he experiences distress. His mother is emotional expressing concerns of her continued observation of watching him suffer and decline. Emotional support provided. We will adjust his Pregablin to 60mg  three times daily and diazepam  will increase to 5mg  every 6 hours as needed. He and mother verbalized understanding. Request pharmacy be [facility] [place]   We discussed his overall health state and poor prognosis.   Mr. Dann was emotional sharing he experiences anxiety, particularly at night, feeling that something might happen. He requires reassurance and comfort from his mother, who sings to him and stays  by his side when he feels anxious.  Emotional support provided. I empathetically approached goals of care discussions and healthcare limitations. I recommended to patient that is is most appropriate to begin considering hospice support although he has reservations. I had extensive discussions with patient and his mother provided education on hospice's goals, philosophy of care, what care could potentially look like in the home with specifics of understanding he would not have to attend further in person office visits which he finds particularly tiresome recently.   Patient and mother discussed and would like to move forward with outpatient hospice referral with specifics to [facility].   All questions answered and support provided.   I discussed the importance of continued conversation with family and their medical providers regarding overall plan of care and treatment options, ensuring decisions are within the context of the patients values and GOCs. Assessment & Plan  Anxiety disorder Anxiety disorder managed with diazepam , refill due.Experiencing anxiety related to health status and fear of being left alone. Anxiety exacerbated by feelings of impending doom and need for reassurance. - Referred to hospice for additional support and reassurance. - Increase diazepam  to 5mg  every 6 hours as needed.    Malignant neoplasm of larynx/Goals of Care Advanced malignant neoplasm of the larynx with significant weight loss and difficulty with oral intake due to tracheostomy and throat size limitations. Currently using a hospital bed at home due to inability to cook independently. Patient and mother clear in expressed goals to focus on his comfort in the home.  - Referred to hospice for comprehensive care and support for what time he has left.  -Referral placed to [facility] - Ensured hospice team evaluates and adjusts home equipment as needed.  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 Current regimen includes methadone  and pregabalin . - Increased methadone  to 5-7 mg every 6 hours. - Increased pregabalin  to 75 mg three times daily.   Palliative care management Discussion of hospice care benefits, including pain management, emotional support, and assistance with daily activities. Emphasis on the non-terminal nature of hospice care and its role in improving quality of life. - Referred to hospice for comprehensive palliative care management. - Ensured hospice team handles medication refills and adjustments as needed.  Patient expressed understanding and was in agreement with this plan. He also understands that He can call the clinic at any time with any questions, concerns, or complaints.   Any controlled substances utilized were prescribed in the context of palliative care. PDMP has been reviewed.   Visit consisted of counseling and education  dealing with the complex and emotionally intense issues of symptom management and palliative care in the setting of serious and potentially life-threatening illness.  David [name], AGPCNP-BC  Palliative Medicine Team/[facility]

## 2023-12-10 ENCOUNTER — Other Ambulatory Visit: Payer: Self-pay

## 2023-12-10 ENCOUNTER — Telehealth: Payer: Self-pay

## 2023-12-10 DIAGNOSIS — C329 Malignant neoplasm of larynx, unspecified: Secondary | ICD-10-CM

## 2023-12-10 DIAGNOSIS — Z515 Encounter for palliative care: Secondary | ICD-10-CM

## 2023-12-10 DIAGNOSIS — G893 Neoplasm related pain (acute) (chronic): Secondary | ICD-10-CM

## 2023-12-10 MED ORDER — PREGABALIN 20 MG/ML PO SOLN
75.0000 mg | Freq: Three times a day (TID) | ORAL | 2 refills | Status: AC
Start: 2023-12-10 — End: ?

## 2023-12-10 NOTE — Telephone Encounter (Signed)
 Pt called asking when he could pick up his pregabalin , RN called pharmacy and confirmed pick up date. Pt and family updated as well as education provided on hospice support in the home. No further needs at this time.

## 2023-12-16 ENCOUNTER — Ambulatory Visit (HOSPITAL_COMMUNITY)
Admission: RE | Admit: 2023-12-16 | Discharge: 2023-12-16 | Disposition: A | Discharge status: Home / Self Care | Payer: MEDICAID | Source: Ambulatory Visit | Attending: Acute Care | Admitting: Acute Care

## 2023-12-16 DIAGNOSIS — Z930 Tracheostomy status: Secondary | ICD-10-CM | POA: Diagnosis present

## 2023-12-16 DIAGNOSIS — J386 Stenosis of larynx: Secondary | ICD-10-CM

## 2023-12-16 DIAGNOSIS — M436 Torticollis: Secondary | ICD-10-CM

## 2023-12-16 DIAGNOSIS — E43 Unspecified severe protein-calorie malnutrition: Secondary | ICD-10-CM | POA: Diagnosis not present

## 2023-12-16 DIAGNOSIS — R1310 Dysphagia, unspecified: Secondary | ICD-10-CM

## 2023-12-16 DIAGNOSIS — Z66 Do not resuscitate: Secondary | ICD-10-CM | POA: Diagnosis present

## 2023-12-16 DIAGNOSIS — L598 Other specified disorders of the skin and subcutaneous tissue related to radiation: Secondary | ICD-10-CM

## 2023-12-16 DIAGNOSIS — C321 Malignant neoplasm of supraglottis: Secondary | ICD-10-CM | POA: Diagnosis present

## 2023-12-16 NOTE — Progress Notes (Signed)
 Tracheostomy Procedure Note  David [name] [id number] 01-30-1975  Pre Procedure Tracheostomy Information  Trach Brand: Portex Bivona Size: 7.0   ref# [id number] Style: Uncuffed Secured by: Velcro   Procedure: Trach Change and Trach Cleaning    Post Procedure Tracheostomy Information  Trach Brand: Portex Bivona Size: 7.0  ref #[id number] Style: Uncuffed Secured by: Velcro   Post Procedure Evaluation:  ETCO2 positive color change from yellow to purple : Yes.   Vital signs:VSS Patients current condition: stable Complications: No apparent complications Trach site exam: clean, dry Wound care done: 4 x 4 gauze drain Patient did tolerate procedure well.   Education: none  Prescription needs: none    Additional needs: New PMV given at this visit

## 2023-12-16 NOTE — Progress Notes (Signed)
 Reason for visit Trach care  Consulting MD David [name]  HPI 48 year old male w/ stage IVB Laryngeal cancer.He is s/p chemo and XRT. Has had slow but steady overall decline over the summer. Specifically significant adult FTT w/ECOG 3.  More recently has entered hospice d/t progressive FTT, recurrent aspiration and wt loss. Has moved in w/ his mother who provides his meals and assists w/ his care  Review of Systems  Constitutional:  Positive for malaise/fatigue and weight loss.  HENT: Negative.    Eyes: Negative.   Respiratory:  Positive for shortness of breath.   Cardiovascular:  Positive for chest pain.  Gastrointestinal: Negative.   Genitourinary: Negative.   Musculoskeletal: Negative.   Skin: Negative.     Exam  General This is a cachectic frail 48 year old male who presents accompanied by his mother for planned tracheostomy change HEENT he has severe torticollis, his right ear is essentially touching his right shoulder, he has minimal neck mobility, his tracheostomy stoma has become progressively malpositioned, given the acute angulation of his head and neck.  When his tracheostomy was removed he had thick discolored mucus, appearing identical to the [employer] he had been drinking on the way in.  His phonation quality is about the same, tracheostomy stoma itself is unchanged other than the malpositioning. Cardiac regular rate and rhythm Abdomen soft nontender Extremities frail Neuro intact   procedure: the # 7 portex was removed.  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 Not candidate for  decannulation   Plan ROV 4 weeks Cont PMV as tolerated HME as able    Dysphagia, unspecified type Overview: Continues to aspiration. Now on hospice  Plan Thicken foods as able but really just let him eat what he wants    DNR (do not resuscitate) Overview: Now on hospice       This visit: review of most recent records, direct face to face time obtaining history, performing physical exam, developing and documenting plan as well as discussing this plan with the patient and/or care givers.  My billing time 21 level 2:[id number]  initial (>36min)   David [name] David [name] ACNP-BC [name]/Critical Care Pager # [phone] OR # [phone] if no answer

## 2024-01-12 IMAGING — DX DG CHEST 2V
2 series · 2 of 2 positions shown · non-contrast
Comparison: 04/01/2021

CLINICAL DATA: Cough

EXAM:
CHEST - 2 VIEW

[chest pa]
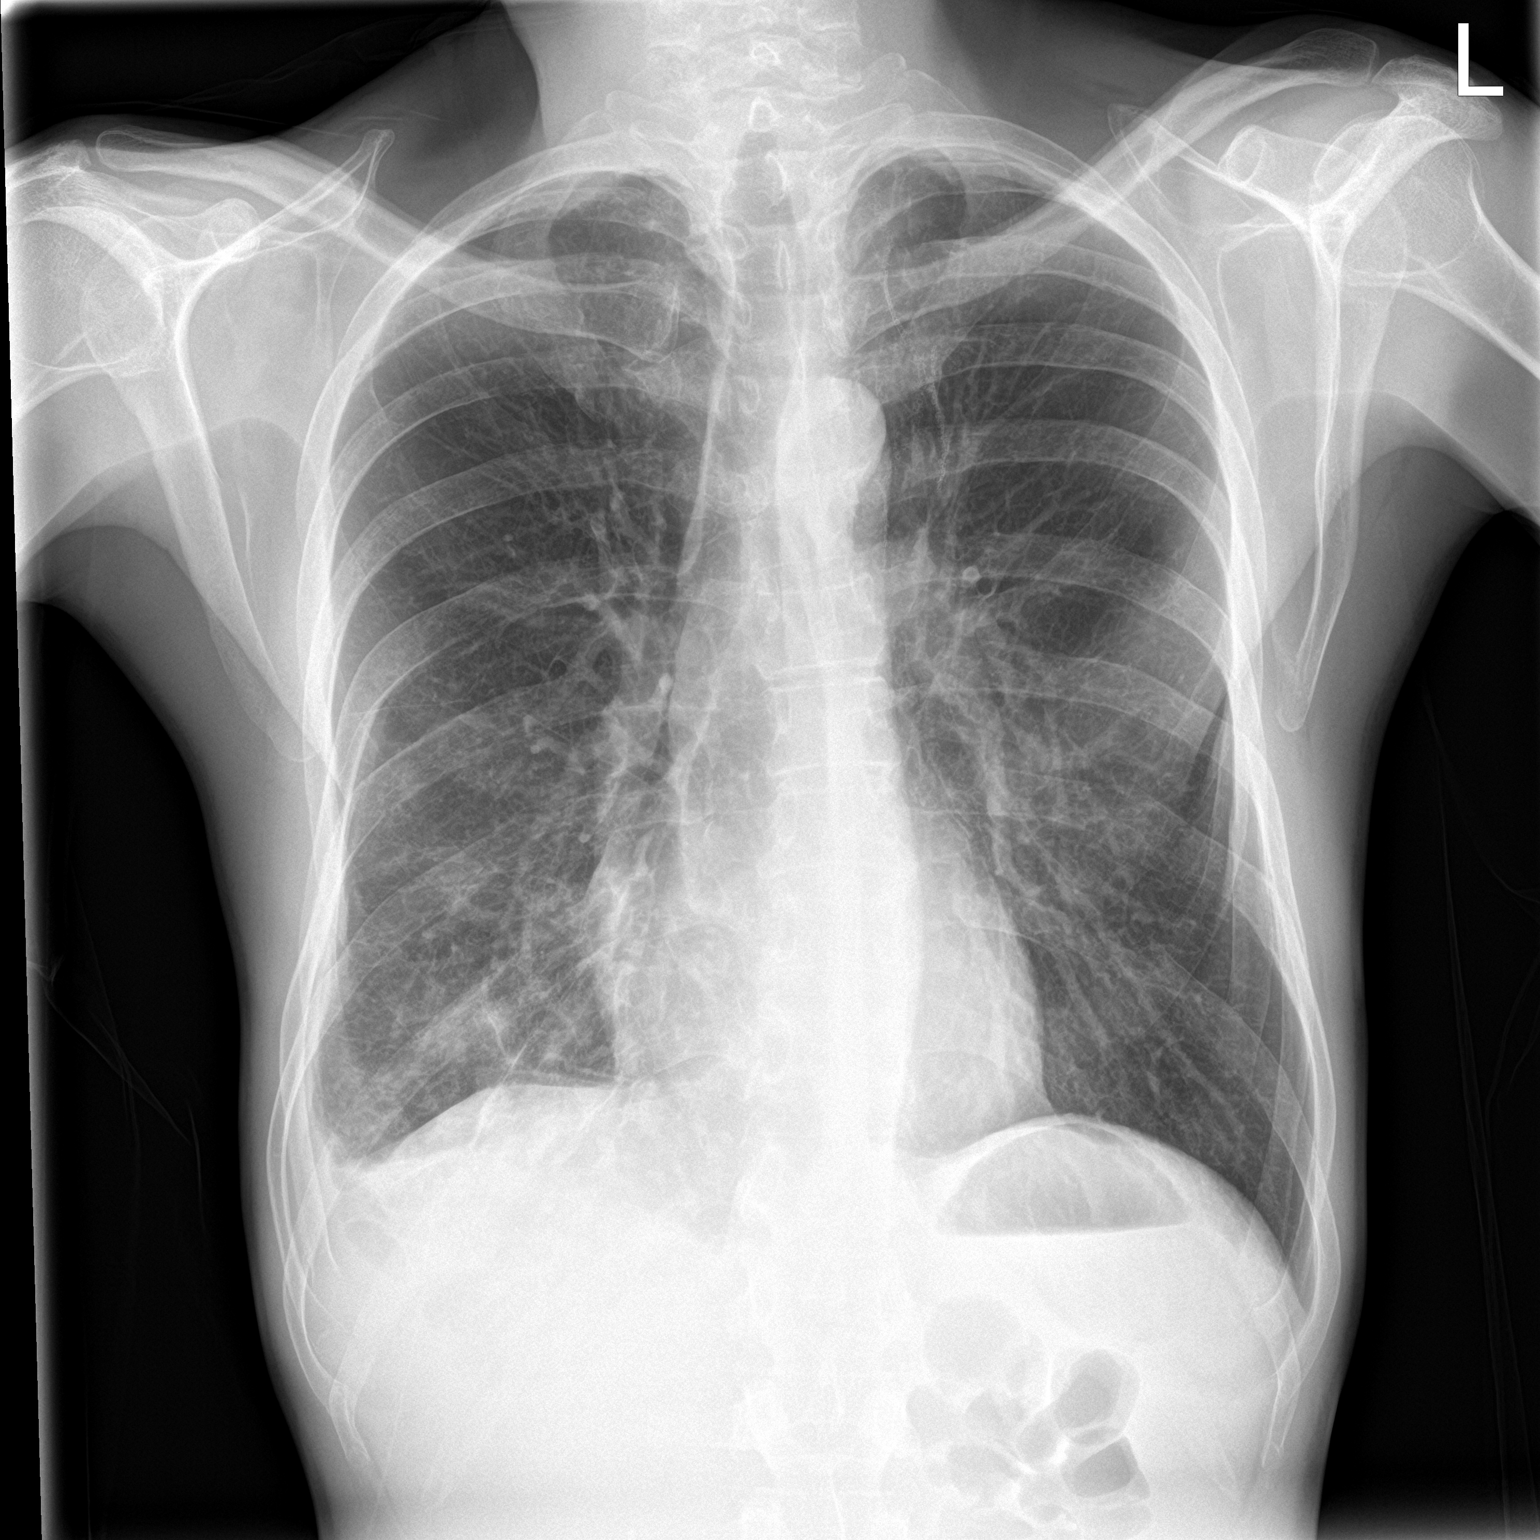

[chest lat]
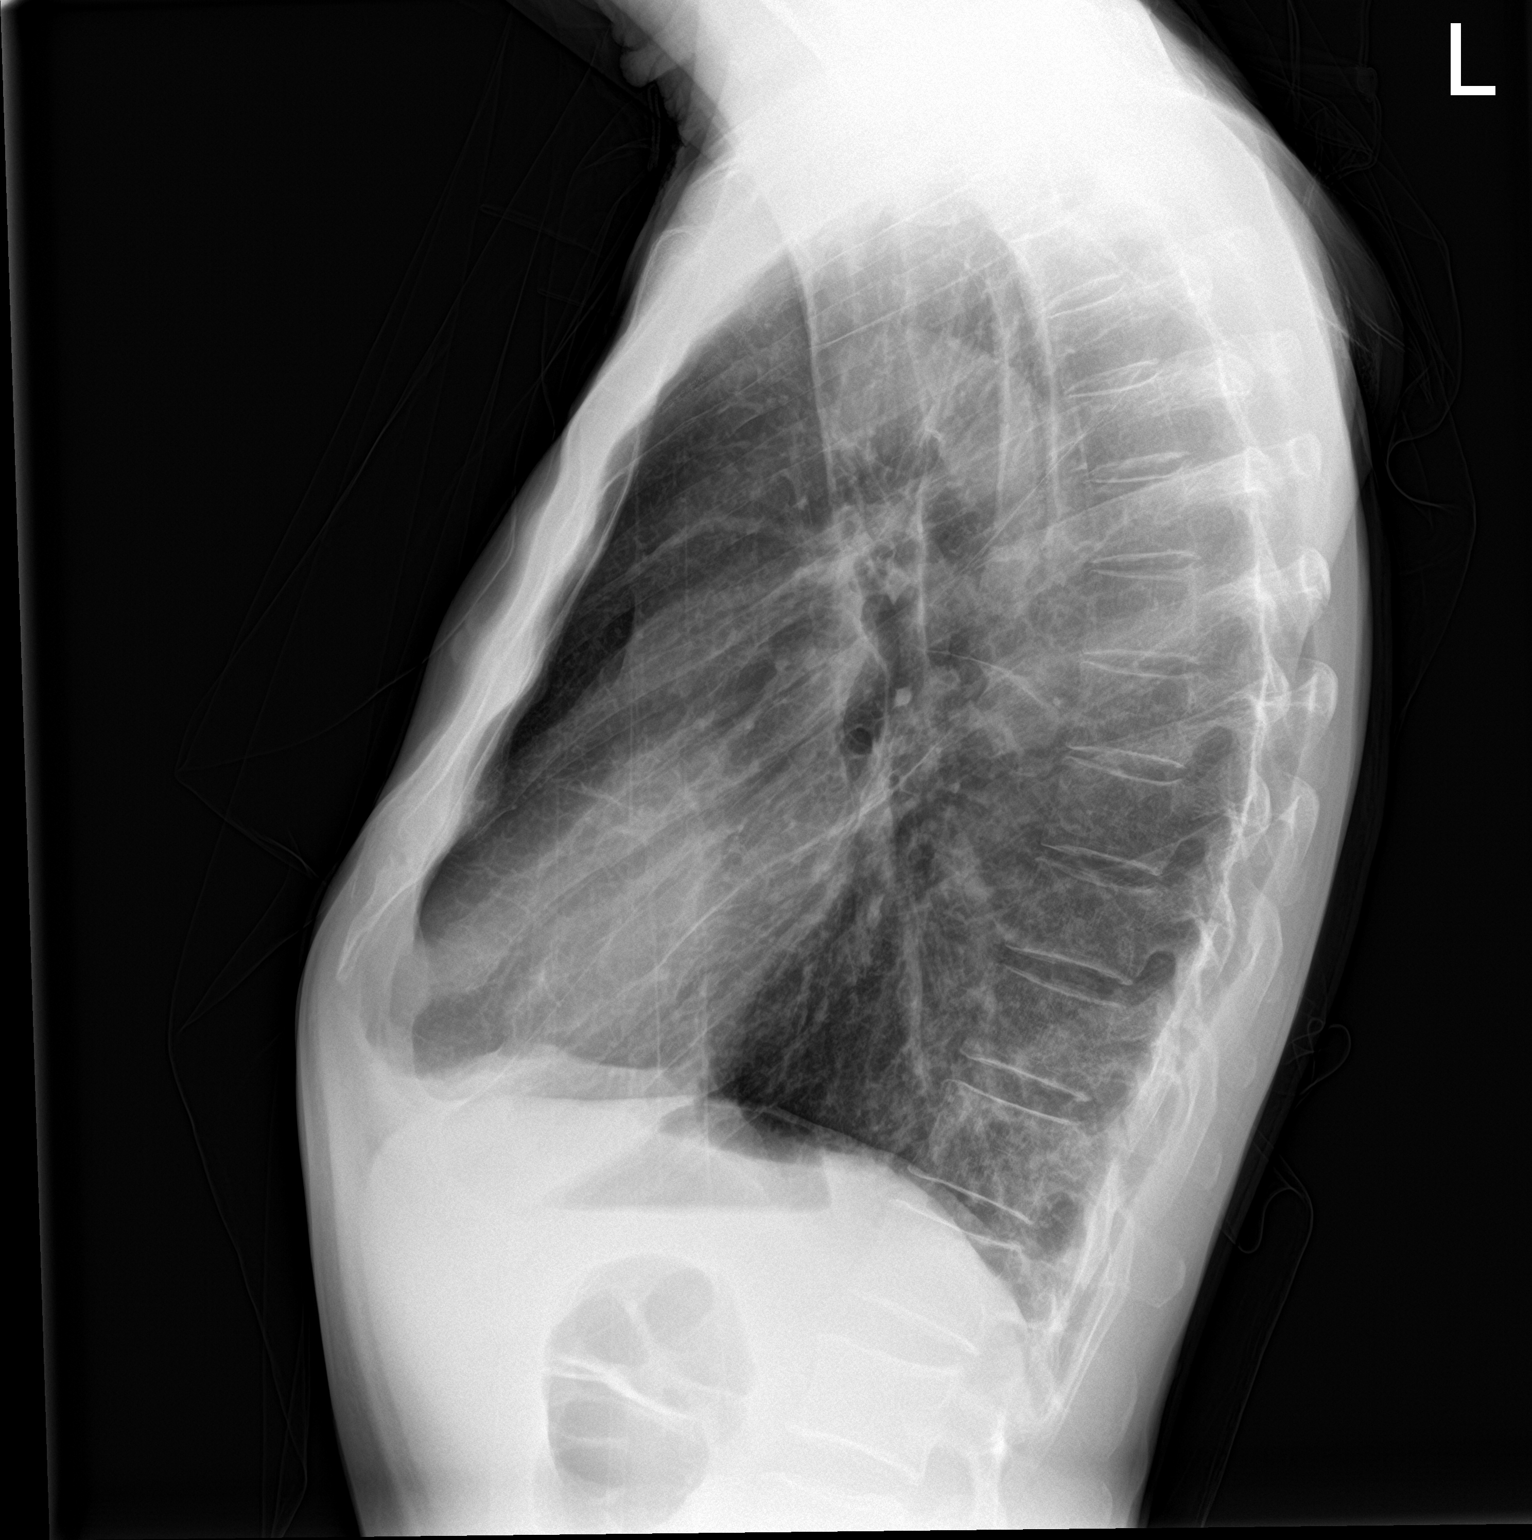

[2 of 2 positions shown; findings below may reference images not displayed]

FINDINGS: Increased patchy right lower lung opacity with possible small right
effusion. Normal cardiac size. No pneumothorax
IMPRESSION: Slight increase patchy right lower lung opacity suspicious for
pneumonia. Possible small right effusion

## 2024-02-11 ENCOUNTER — Ambulatory Visit (HOSPITAL_COMMUNITY)
Admission: RE | Admit: 2024-02-11 | Discharge: 2024-02-11 | Disposition: A | Discharge status: Home / Self Care | Payer: MEDICAID | Source: Ambulatory Visit | Attending: Acute Care | Admitting: Acute Care

## 2024-02-11 DIAGNOSIS — Z930 Tracheostomy status: Secondary | ICD-10-CM | POA: Insufficient documentation

## 2024-02-11 DIAGNOSIS — R1310 Dysphagia, unspecified: Secondary | ICD-10-CM | POA: Diagnosis not present

## 2024-02-11 DIAGNOSIS — J386 Stenosis of larynx: Secondary | ICD-10-CM | POA: Diagnosis not present

## 2024-02-11 NOTE — Progress Notes (Signed)
 Reason for visit Trach care  Consulting MD Izell  HPI 49 year old male w/ stage IVB Laryngeal cancer.He is s/p chemo and XRT. Has had slow but steady overall decline over the summer. Specifically significant adult FTT w/ECOG 3.  More recently has entered hospice d/t progressive FTT, recurrent aspiration and wt loss.  Presents today for planned tracheostomy change. No significant new reports.  Still sounds like he is aspirating.  Neck mobility a little worse.   Review of Systems  Constitutional:  Positive for malaise/fatigue. Negative for weight loss.  HENT: Negative.    Eyes: Negative.   Respiratory:  Positive for cough. Negative for shortness of breath.   Cardiovascular:  Negative for chest pain.  Gastrointestinal: Negative.   Genitourinary: Negative.   Musculoskeletal: Negative.   Skin: Negative.     Exam  General This is a cachectic 49 year old male patient well-known to me.  Presents today for planned tracheostomy change HEENT temporal wasting, he is severely contracted with his right ear touching his right shoulder.  The tracheostomy stoma is oriented more towards the left of the neck.  Tracheostomy stoma itself was unremarkable.  He still has thick yellow secretions from tracheostomy.  A size 7 Portex is in place he is able to speak in short phrases with PMV in place Pulmonary clear, at times has rhonchi that clear with cough Cardiac regular rate and rhythm Abdomen soft Extremities frail  procedure: the # 7 portex was removed.  New 7 portex placed w/out difficulty   Active Problems: Active Problems:   Tracheostomy status (HCC)   Subglottic stenosis   Dysphagia  Tracheostomy status (HCC) Overview: Brand: shiley Size: 7 Portex Bivona cuffless  Last change in clinic: today 1/14  Discussion Stable trach. Not candidate for decannulation.  Continues to aspirate.  Not really putting on weight.  Currently in hospice.  I cannot say he is thriving, but he is holding his  own.  At this point my total focus for him is just maintaining comfort and dignity from what ever part the tracheostomy place and this   Plan ROV 4 weeks Cont PMV as tolerated HME as able       This visit: review of most recent records, direct face to face time obtaining history, performing physical exam, developing and documenting plan as well as discussing this plan with the patient and/or care givers.  My billing time 22 level 3: [id number]  (>20 min)

## 2024-02-11 NOTE — Progress Notes (Signed)
 Tracheostomy Procedure Note  David [name] [id number] 13-May-1975  Pre Procedure Tracheostomy Information  Trach Brand: Portex Size: #7 ref: [id number] Style: Uncuffed Secured by: Velcro   Procedure: trach change, cleaning and education.    Post Procedure Tracheostomy Information  Trach Brand: Portex Size: #7 ref: [id number] Style: Uncuffed Secured by: Velcro   Post Procedure Evaluation:  ETCO2 positive color change from yellow to purple : Yes.   Vital signs: stable throughout. Patients current condition: stable Complications: No apparent complications Trach site exam: clean, dry, no drainage Wound care done: dry, clean, and 4 x 4 gauze Patient did tolerate procedure well.   Education: Discussed with Jenna, CCM-NP  Prescription needs: None    Additional needs: New PMV and drain gauze sent home with patient following appt.

## 2024-02-27 ENCOUNTER — Other Ambulatory Visit: Payer: Self-pay

## 2024-02-27 DIAGNOSIS — C329 Malignant neoplasm of larynx, unspecified: Secondary | ICD-10-CM

## 2024-03-01 ENCOUNTER — Other Ambulatory Visit: Payer: Self-pay | Admitting: Student

## 2024-03-02 ENCOUNTER — Other Ambulatory Visit (HOSPITAL_COMMUNITY): Payer: Self-pay | Admitting: *Deleted

## 2024-03-02 DIAGNOSIS — R1310 Dysphagia, unspecified: Secondary | ICD-10-CM

## 2024-03-31 ENCOUNTER — Encounter (HOSPITAL_COMMUNITY): Payer: MEDICAID
